# Patient Record
Sex: Female | Born: 1945 | Race: Black or African American | Hispanic: No | Marital: Single | Smoking: Never smoker
Health system: Southern US, Community
[De-identification: ages and names within clinical notes are randomized; demographics above are authoritative.]

## PROBLEM LIST (undated history)

## (undated) DIAGNOSIS — R4020 Unspecified coma: Secondary | ICD-10-CM

## (undated) DIAGNOSIS — L97909 Non-pressure chronic ulcer of unspecified part of unspecified lower leg with unspecified severity: Secondary | ICD-10-CM

## (undated) DIAGNOSIS — I998 Other disorder of circulatory system: Secondary | ICD-10-CM

## (undated) DIAGNOSIS — E1140 Type 2 diabetes mellitus with diabetic neuropathy, unspecified: Secondary | ICD-10-CM

## (undated) DIAGNOSIS — Z992 Dependence on renal dialysis: Secondary | ICD-10-CM

## (undated) DIAGNOSIS — Z978 Presence of other specified devices: Secondary | ICD-10-CM

## (undated) DIAGNOSIS — L089 Local infection of the skin and subcutaneous tissue, unspecified: Secondary | ICD-10-CM

## (undated) DIAGNOSIS — Z960 Presence of urogenital implants: Secondary | ICD-10-CM

## (undated) DIAGNOSIS — E785 Hyperlipidemia, unspecified: Secondary | ICD-10-CM

## (undated) DIAGNOSIS — E1165 Type 2 diabetes mellitus with hyperglycemia: Secondary | ICD-10-CM

## (undated) DIAGNOSIS — I2510 Atherosclerotic heart disease of native coronary artery without angina pectoris: Secondary | ICD-10-CM

## (undated) DIAGNOSIS — E875 Hyperkalemia: Secondary | ICD-10-CM

## (undated) DIAGNOSIS — N390 Urinary tract infection, site not specified: Secondary | ICD-10-CM

## (undated) DIAGNOSIS — Z8673 Personal history of transient ischemic attack (TIA), and cerebral infarction without residual deficits: Secondary | ICD-10-CM

## (undated) DIAGNOSIS — D638 Anemia in other chronic diseases classified elsewhere: Secondary | ICD-10-CM

## (undated) DIAGNOSIS — M1711 Unilateral primary osteoarthritis, right knee: Secondary | ICD-10-CM

## (undated) DIAGNOSIS — D649 Anemia, unspecified: Secondary | ICD-10-CM

## (undated) DIAGNOSIS — R339 Retention of urine, unspecified: Secondary | ICD-10-CM

## (undated) DIAGNOSIS — Z794 Long term (current) use of insulin: Secondary | ICD-10-CM

## (undated) DIAGNOSIS — I70229 Atherosclerosis of native arteries of extremities with rest pain, unspecified extremity: Secondary | ICD-10-CM

## (undated) DIAGNOSIS — K920 Hematemesis: Secondary | ICD-10-CM

## (undated) DIAGNOSIS — N186 End stage renal disease: Secondary | ICD-10-CM

## (undated) DIAGNOSIS — I739 Peripheral vascular disease, unspecified: Secondary | ICD-10-CM

## (undated) DIAGNOSIS — IMO0001 Reserved for inherently not codable concepts without codable children: Secondary | ICD-10-CM

## (undated) DIAGNOSIS — M109 Gout, unspecified: Secondary | ICD-10-CM

## (undated) HISTORY — DX: Personal history of transient ischemic attack (TIA), and cerebral infarction without residual deficits: Z86.73

## (undated) HISTORY — DX: Long term (current) use of insulin: Z79.4

## (undated) HISTORY — PX: NM MYOCAR PERF WALL MOTION: HXRAD629

## (undated) HISTORY — DX: Type 2 diabetes mellitus with hyperglycemia: E11.65

## (undated) HISTORY — DX: Anemia in other chronic diseases classified elsewhere: D63.8

## (undated) HISTORY — DX: Unilateral primary osteoarthritis, right knee: M17.11

## (undated) HISTORY — DX: Reserved for inherently not codable concepts without codable children: IMO0001

## (undated) HISTORY — DX: Type 2 diabetes mellitus with diabetic neuropathy, unspecified: E11.40

## (undated) HISTORY — DX: Atherosclerotic heart disease of native coronary artery without angina pectoris: I25.10

## (undated) HISTORY — DX: Non-pressure chronic ulcer of unspecified part of unspecified lower leg with unspecified severity: I73.9

## (undated) HISTORY — DX: Peripheral vascular disease, unspecified: L97.909

## (undated) HISTORY — DX: Other disorder of circulatory system: I99.8

## (undated) HISTORY — PX: BELOW KNEE LEG AMPUTATION: SUR23

## (undated) HISTORY — DX: Atherosclerosis of native arteries of extremities with rest pain, unspecified extremity: I70.229

## (undated) HISTORY — DX: Hyperlipidemia, unspecified: E78.5

---

## 2010-07-23 ENCOUNTER — Ambulatory Visit (HOSPITAL_COMMUNITY)
Admission: RE | Admit: 2010-07-23 | Discharge: 2010-07-23 | Disposition: A | Discharge status: Home / Self Care | Payer: Medicare Other | Source: Ambulatory Visit | Attending: Specialist | Admitting: Specialist

## 2010-07-23 ENCOUNTER — Other Ambulatory Visit: Payer: Self-pay

## 2010-07-23 DIAGNOSIS — W19XXXA Unspecified fall, initial encounter: Secondary | ICD-10-CM

## 2010-07-23 DIAGNOSIS — M25569 Pain in unspecified knee: Secondary | ICD-10-CM | POA: Insufficient documentation

## 2012-01-04 ENCOUNTER — Emergency Department (HOSPITAL_COMMUNITY): Payer: Medicare Other

## 2012-01-04 ENCOUNTER — Emergency Department (HOSPITAL_COMMUNITY)
Admission: EM | Admit: 2012-01-04 | Discharge: 2012-01-05 | Disposition: A | Discharge status: Home / Self Care | Payer: Medicare Other | Attending: Emergency Medicine | Admitting: Emergency Medicine

## 2012-01-04 ENCOUNTER — Encounter (HOSPITAL_COMMUNITY): Payer: Self-pay | Admitting: *Deleted

## 2012-01-04 ENCOUNTER — Encounter (HOSPITAL_COMMUNITY): Payer: Self-pay

## 2012-01-04 ENCOUNTER — Emergency Department (INDEPENDENT_AMBULATORY_CARE_PROVIDER_SITE_OTHER)
Admission: EM | Admit: 2012-01-04 | Discharge: 2012-01-04 | Disposition: A | Discharge status: Nursing Home (Includes ICF) | Payer: Medicare Other | Source: Home / Self Care | Attending: Emergency Medicine | Admitting: Emergency Medicine

## 2012-01-04 DIAGNOSIS — R600 Localized edema: Secondary | ICD-10-CM

## 2012-01-04 DIAGNOSIS — R609 Edema, unspecified: Secondary | ICD-10-CM

## 2012-01-04 DIAGNOSIS — E119 Type 2 diabetes mellitus without complications: Secondary | ICD-10-CM | POA: Insufficient documentation

## 2012-01-04 DIAGNOSIS — E785 Hyperlipidemia, unspecified: Secondary | ICD-10-CM | POA: Insufficient documentation

## 2012-01-04 DIAGNOSIS — R109 Unspecified abdominal pain: Secondary | ICD-10-CM | POA: Insufficient documentation

## 2012-01-04 DIAGNOSIS — N19 Unspecified kidney failure: Secondary | ICD-10-CM

## 2012-01-04 DIAGNOSIS — R339 Retention of urine, unspecified: Secondary | ICD-10-CM | POA: Insufficient documentation

## 2012-01-04 DIAGNOSIS — M7989 Other specified soft tissue disorders: Secondary | ICD-10-CM | POA: Insufficient documentation

## 2012-01-04 LAB — COMPREHENSIVE METABOLIC PANEL
AST: 33 U/L (ref 0–37)
Albumin: 2.8 g/dL — ABNORMAL LOW (ref 3.5–5.2)
Alkaline Phosphatase: 78 U/L (ref 39–117)
BUN: 50 mg/dL — ABNORMAL HIGH (ref 6–23)
Potassium: 4.5 mEq/L (ref 3.5–5.1)
Sodium: 141 mEq/L (ref 135–145)
Total Protein: 7 g/dL (ref 6.0–8.3)

## 2012-01-04 LAB — URINALYSIS, ROUTINE W REFLEX MICROSCOPIC
Bilirubin Urine: NEGATIVE
Glucose, UA: 250 mg/dL — AB
Leukocytes, UA: NEGATIVE
Nitrite: NEGATIVE
Specific Gravity, Urine: 1.014 (ref 1.005–1.030)
pH: 5.5 (ref 5.0–8.0)

## 2012-01-04 LAB — CBC WITH DIFFERENTIAL/PLATELET
Basophils Absolute: 0 10*3/uL (ref 0.0–0.1)
Basophils Relative: 0 % (ref 0–1)
Eosinophils Absolute: 0.1 10*3/uL (ref 0.0–0.7)
MCH: 27 pg (ref 26.0–34.0)
MCHC: 32.2 g/dL (ref 30.0–36.0)
Neutro Abs: 7.4 10*3/uL (ref 1.7–7.7)
Neutrophils Relative %: 77 % (ref 43–77)
Platelets: 259 10*3/uL (ref 150–400)
RDW: 13.5 % (ref 11.5–15.5)

## 2012-01-04 LAB — POCT I-STAT, CHEM 8
Creatinine, Ser: 2.2 mg/dL — ABNORMAL HIGH (ref 0.50–1.10)
Glucose, Bld: 98 mg/dL (ref 70–99)
HCT: 33 % — ABNORMAL LOW (ref 36.0–46.0)
Hemoglobin: 11.2 g/dL — ABNORMAL LOW (ref 12.0–15.0)
TCO2: 26 mmol/L (ref 0–100)

## 2012-01-04 LAB — URINE MICROSCOPIC-ADD ON

## 2012-01-04 LAB — PRO B NATRIURETIC PEPTIDE: Pro B Natriuretic peptide (BNP): 599.7 pg/mL — ABNORMAL HIGH (ref 0–125)

## 2012-01-04 NOTE — ED Notes (Signed)
Pt is here with LE swelling from groin down to feet, decreased urination, and elevated bun creatinine.  Sent here to consider intra-abdominal obstruction from [facility].  Pt is alert.  Denies sob

## 2012-01-04 NOTE — Discharge Instructions (Signed)
Acute Urinary Retention, Female  You have been seen by a caregiver today because of your inability to urinate (pass your water). This is an uncommon problem in females.  CAUSES   It can be caused by:   Infection.   A side effect of a medication.   A problem in a nearby organ that presses or squeezes on the bladder or the urethra (the tube that drains the bladder).   Psychological problems.  TREATMENT   Treatment may involve a one-time placement of a tube in the bladder to empty it. It may be a problem that does not recur for years. You and your caregiver can decide how to handle this problem in follow-up. You may need to be referred to a specialist for additional examination and tests.   HOME CARE INSTRUCTIONS   If you are to leave the foley catheter (a long, narrow, hollow tube) in and go home with a drainage system, you will need to discuss the best course of action with your caregiver. While the catheter is in, maintain a good intake of fluids. Keep the drainage bag emptied and lower than your catheter. This is so contaminated (infected) urine will not flow back into your bladder. This could lead to a urinary tract infection.  Only take over-the-counter or prescription medicines for pain, discomfort, or fever as directed by your caregiver.   SEEK IMMEDIATE MEDICAL CARE IF:   You develop chills, fever, or show signs of generalized illness that occurs prior to seeing your caregiver.  Document Released: 02/06/2006 Document Revised: 05/02/2011 Document Reviewed: 01/09/2009  Exit[employer] Patient Information 2013 [employer].

## 2012-01-04 NOTE — ED Notes (Signed)
Pt unable to provide urine specimen; cancelled per MD; pt being transported to ED for further evaluation

## 2012-01-04 NOTE — ED Provider Notes (Addendum)
History     CSN: [id number]  Arrival date & time 01/04/12  1444   First MD Initiated Contact with Patient 01/04/12 1545      Chief Complaint  Patient presents with  . Leg Swelling    (Consider location/radiation/quality/duration/timing/severity/associated sxs/prior treatment) HPI Comments: Patient presents urgent care complaining that both of her lower extremities have become swollen since Sunday it has gotten progressively worse. She does admit that she has seen her doctor within the last 2 weeks and she had a right lower extremity infection that she is taking antibiotics for it currently. Says his mildly better less swollen and less red but still tender at touch. She doesn't describes that her doctor recently in the month of October start her on insulin as her diabetes was out of control. She described that she had phone communication with her doctor's office she explained to them that she was having some difficulty urinating they advised her that she might have a urinary tract infection. Patient denies any pain or burning with urination and describes that she has not been able to urinate since last night. She is having some discomfort in her lower abdomen. Patient also describes that since Sunday she is spinning wiping after going to the bathroom she has been noticing blood in the toilet paper. Not on her stools.  The history is provided by the patient.    Past Medical History  Diagnosis Date  . Diabetes mellitus without complication   . Hyperlipidemia     History reviewed. No pertinent past surgical history.  No family history on file.  History  Substance Use Topics  . Smoking status: Never Smoker   . Smokeless tobacco: Not on file  . Alcohol Use: Yes    OB History    Grav Para Term Preterm Abortions TAB SAB Ect Mult Living                  Review of Systems  Constitutional: Positive for activity change and appetite change. Negative for fever and fatigue.    Respiratory: Negative for cough and shortness of breath.   Cardiovascular: Negative for chest pain and leg swelling.  Gastrointestinal: Positive for abdominal pain and anal bleeding. Negative for nausea and diarrhea.  Genitourinary: Positive for difficulty urinating. Negative for dysuria, frequency and flank pain.  Skin: Negative for rash.  Neurological: Negative for dizziness.    Allergies  Review of patient's allergies indicates no known allergies.  Home Medications   Current Outpatient Rx  Name  Route  Sig  Dispense  Refill  . CHOLINE FENOFIBRATE 135 MG PO CPDR   Oral   Take 135 mg by mouth daily.         . INSULIN ASPART 100 UNIT/ML [place] SOLN   Subcutaneous   Inject 20 Units into the skin daily.         . INSULIN GLARGINE 100 UNIT/ML [place] SOLN   Subcutaneous   Inject 20 Units into the skin daily.         Marland Kitchen PRAVASTATIN SODIUM 20 MG PO TABS   Oral   Take 20 mg by mouth daily.           BP 172/89  Pulse 105  Temp 98.1 F (36.7 C) (Oral)  Resp 16  SpO2 99%  Physical Exam  Constitutional: She appears well-developed and well-nourished.  Abdominal: Soft. She exhibits mass. There is no hepatosplenomegaly. There is tenderness in the suprapubic area. There is no rigidity, no rebound, no guarding,  no CVA tenderness, no tenderness at McBurney's point and negative Murphy's sign.    Genitourinary:     Musculoskeletal: She exhibits tenderness.       Legs: Skin: Rash noted.    ED Course  Procedures (including critical care time)  Labs Reviewed  POCT I-STAT, CHEM 8 - Abnormal; Notable for the following:    BUN 52 (*)     Creatinine, Ser 2.20 (*)     Hemoglobin 11.2 (*)     HCT 33.0 (*)     All other components within normal limits   No results found.       MDM  Problem #1 urinary retention with a vesicular below. Do not find any explanation as in pharmacologically induced retention has a palpable physical globe and is unable to provide a urine sample.  Coexistent with this is the sudden onset of lowerBL extremity edema which suggest the possibility of a underlying reasons for this obstructive urinary pattern. Of note patient is mildly tender in her lower abdomen and although a physical globe is most likely (a mass in her lower abdomen cannot be excluded at this point). Problem #2 rectal bleeding source appears to be external hemorrhoids. I think patient will probably need further workup in the emergency department, perhaps will need also a in and out cath and subsequently monitor her urinary output.  66 year old female presents urgent care with urinary retention and newly bilateral lower extremity edema. Patient also with acute renal failure. Patient will be transferred to the emergency department for further evaluation, to be consider for a intra-abdominal obstructive secondary reason for her retention. Patient is hemodynamically stable and afebrile. Has been taking antibiotics by her PCP for a right lower extremity localize infection.       Jimmie Molly, MD 01/04/12 1626  Jimmie Molly, MD 01/04/12 1636  Jimmie Molly, MD 01/04/12 [phone]

## 2012-01-04 NOTE — ED Notes (Signed)
Pt up to use restroom.

## 2012-01-04 NOTE — ED Notes (Signed)
Pt able to void, in and out cath discontinued.

## 2012-01-04 NOTE — ED Notes (Signed)
Patient transported to Ultrasound 

## 2012-01-04 NOTE — ED Provider Notes (Addendum)
History     CSN: [id number]  Arrival date & time 01/04/12  1648   First MD Initiated Contact with Patient 01/04/12 1955      Chief Complaint  Patient presents with  . ucc tx, Leg swelling, urinary retention     (Consider location/radiation/quality/duration/timing/severity/associated sxs/prior treatment) HPIBetty [name] is a 66 y.o. female with a history of diabetes and hyperlipidemia who presents from urgent care with a history of lower extremity swelling, urinary retention. Patient's symptoms started on Sunday when she had consistent diarrhea, she had innumerable episodes of diarrhea, she previously been on Keflex for a right lower extremity wound and she still has one day left of the antibiotic, she's taken it today it was a 14 day course. She says her diarrhea was green and foul-smelling. Her diarrhea is resolved, she's having green stools but they are normal in consistency and frequency. She's complaining about some sharp pain on her left flank which comes and goes, she has not completely emptied her bladder since Monday, she's been having a little bit of output urinary-wise since then.  Denies any new medicine aside insulin. Her lower extremity swelling is also been worsening since Sunday using, she is typically had lower extremity edema but just in her ankles and feet this resolves overnight and gets worse towards the end of the day. Today her swelling goes up to her hips and is made her knees stiff because of the tissue swelling.  Past Medical History  Diagnosis Date  . Diabetes mellitus without complication   . Hyperlipidemia     No past surgical history on file.  No family history on file.  History  Substance Use Topics  . Smoking status: Never Smoker   . Smokeless tobacco: Not on file  . Alcohol Use: Yes    OB History    Grav Para Term Preterm Abortions TAB SAB Ect Mult Living                  Review of Systems At least 10pt or greater review of systems  completed and are negative except where specified in the HPI.  Allergies  Review of patient's allergies indicates no known allergies.  Home Medications   Current Outpatient Rx  Name  Route  Sig  Dispense  Refill  . CHOLINE FENOFIBRATE 135 MG PO CPDR   Oral   Take 135 mg by mouth daily.         . INSULIN ASPART 100 UNIT/ML [place] SOLN   Subcutaneous   Inject 20 Units into the skin daily.         . INSULIN GLARGINE 100 UNIT/ML [place] SOLN   Subcutaneous   Inject 20 Units into the skin daily.         Marland Kitchen PRAVASTATIN SODIUM 20 MG PO TABS   Oral   Take 20 mg by mouth daily.           BP 168/96  Pulse 109  Temp 98.2 F (36.8 C) (Oral)  Resp 18  SpO2 99%  Physical Exam  Nursing notes reviewed.  Electronic medical record reviewed. VITAL SIGNS:   Filed Vitals:   01/04/12 1712  BP: 168/96  Pulse: 109  Temp: 98.2 F (36.8 C)  TempSrc: Oral  Resp: 18  SpO2: 99%   CONSTITUTIONAL: Awake, oriented, appears non-toxic HENT: Atraumatic, normocephalic, oral mucosa pink and moist, airway patent. Nares patent without drainage. External ears normal. EYES: Conjunctiva clear, EOMI, PERRLA NECK: Trachea midline, non-tender, supple CARDIOVASCULAR: Normal heart rate,  Normal rhythm, No murmurs, rubs, gallops PULMONARY/CHEST: Clear to auscultation, no rhonchi, wheezes, or rales. Symmetrical breath sounds. Non-tender. ABDOMINAL: Non-distended, soft, non-tender - no rebound or guarding. Patient's bladder is palpable up to her umbilicus. BS normal. NEUROLOGIC: Non-focal, moving all four extremities, no gross sensory or motor deficits. EXTREMITIES: No clubbing, cyanosis. 2+ lower extremity edema, this does extend to the proximal lower extremities but is only 1+ at the knees then trace at the hips. SKIN: Warm, Dry, No erythema, No rash  ED Course  [place] bedside Performed by: Jones Skene Authorized by: Jones Skene Comments: Abdominal ultrasound performed at bedside shows very enlarged  bladder. Small hypoechoic foci in the kidneys looking like cysts seen on renal ultrasound.   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 infarct old   Labs Reviewed  CBC WITH DIFFERENTIAL - Abnormal; Notable for the following:    RBC 3.52 (*)     Hemoglobin 9.5 (*)     HCT 29.5 (*)     All other components within normal limits  COMPREHENSIVE METABOLIC PANEL - Abnormal; Notable for the following:    BUN 50 (*)     Creatinine, Ser 2.28 (*)     Albumin 2.8 (*)     Total Bilirubin 0.1 (*)     GFR calc non Af Amer 21 (*)     GFR calc Af Amer 24 (*)     All other components within normal limits  PRO B NATRIURETIC PEPTIDE - Abnormal; Notable for the following:    Pro B Natriuretic peptide (BNP) 599.7 (*)     All other components within normal limits   US Abdomen Complete  01/04/2012  *RADIOLOGY REPORT*  Clinical Data:  Evaluate hydronephrosis.  ABDOMINAL ULTRASOUND COMPLETE  Comparison:  None  Findings:  Gallbladder:  The gallbladder appears contracted.  No wall thickening, gallstones or pericholecystic fluid.  Negative sonographic Murphy's sign.  Common Bile Duct:  Within normal limits in caliber.  Liver: Margins of the liver appear slightly nodular.  No focal mass lesion identified.  Within normal limits in parenchymal echogenicity.  IVC:  Appears normal.  Pancreas:  No abnormality identified.  Spleen:  Within normal limits in size and echotexture.  Right kidney:  The right kidney measures 8.6 cm.  There is increased cortical echogenicity.  Multiple anechoic structures within the right kidney likely represents cyst.  Left kidney:  The left kidney measures 8.7 cm.  There are multiple low attenuation structures within the left kidney consistent with cysts.  The cortex appears echogenic.   Abdominal Aorta:  No aneurysm identified.  IMPRESSION:  1.  No hydronephrosis. 2.  Bilateral echogenic kidneys consistent with chronic medical renal disease. 3.  Renal cysts.   Original Report Authenticated By: Signa Kell, M.D.      1. Urinary retention   2. Lower extremity edema       MDM  Elizabeth [name] is a 66 y.o. female presenting with urinary retention and lower extremity edema. Patient has no rales, and mild tachycardia, this could be due to acute urinary retention. Patient also does drink she says 2 beers a day however these are pints I question whether or not it is an underestimation. She has mild left-sided left lower quadrant pain could be related to the urinary retention as well.  She was able to  void but did have a post void is still of 900 cc, was catheterized for the remaining volume. Urinalysis is unremarkable for this long-standing diabetic with some minimal glucose and protein as well. Patient does have a slightly elevated BNP I do not think this represents an acute exacerbation of heart failure, she does not have a history of heart failure currently. Does have some lower extremity edema - again I question whether this is not related to her drinking alcohol and poor nutrition. Patient's albumin is 2.8. She is also mildly anemic, this is been chronic problem. Ultrasound of the abdomen  Shows no hydronephrosis with bilateral echogenic kidneys consistent with chronic medical renal disease with renal cysts. Patient follows up with Dr. Mayford Knife and does have an appointment tomorrow. I do not think she has an emergent need for any further imaging or testing at this time.  Also refer her to urology for possible neurogenic bladder. She has seen PCP tomorrow, do not think any diuretics are indicated at this time either.  I explained the diagnosis and have given explicit precautions to return to the ER including fevers, chills, worsening pain or any other new or worsening symptoms. The  patient understands and accepts the medical plan as it's been dictated and I have answered their questions. Discharge instructions concerning home care and prescriptions have been given.  The patient is STABLE and is discharged to home in good condition.  Jones Skene, MD 01/05/12 0045  Jones Skene, MD 01/05/12 [id number]

## 2012-01-04 NOTE — ED Notes (Signed)
Patient states that both of her legs are swelling and that she has a sore on the left shin that she would like looked at, sx started Sunday morning, she has been elevating them but swelling still increased

## 2012-01-04 NOTE — ED Notes (Addendum)
Bladder scan for residual, >971mL noted. EDP aware.

## 2012-01-04 NOTE — ED Notes (Signed)
Pt reports dark green diarrhea on Sunday.  Pt has pain to rectum.  Pt states some pain to LLQ

## 2012-01-05 NOTE — ED Notes (Signed)
Pt educated on foley catheter care, how empty bag, how to change bag, and how to provide perineal care appropriately. Provided patient with leg bag and straps, and educated on how to use equipment. Pt verbalized understanding and used teach back method to confirm understanding. Pt states will follow up with PCP in the morning and will call urologist to set up appointment in the morning.

## 2012-03-12 ENCOUNTER — Other Ambulatory Visit: Payer: Self-pay | Admitting: Urology

## 2012-03-23 ENCOUNTER — Encounter (HOSPITAL_COMMUNITY): Payer: Self-pay | Admitting: Pharmacy Technician

## 2012-03-28 ENCOUNTER — Encounter (HOSPITAL_COMMUNITY): Payer: Self-pay

## 2012-03-28 ENCOUNTER — Encounter (HOSPITAL_COMMUNITY)
Admission: RE | Admit: 2012-03-28 | Discharge: 2012-03-28 | Disposition: A | Discharge status: Home / Self Care | Payer: Medicare Other | Source: Ambulatory Visit | Attending: Urology | Admitting: Urology

## 2012-03-28 ENCOUNTER — Ambulatory Visit (HOSPITAL_COMMUNITY)
Admission: RE | Admit: 2012-03-28 | Discharge: 2012-03-28 | Disposition: A | Discharge status: Home / Self Care | Payer: Medicare Other | Source: Ambulatory Visit | Attending: Urology | Admitting: Urology

## 2012-03-28 ENCOUNTER — Other Ambulatory Visit: Payer: Self-pay

## 2012-03-28 DIAGNOSIS — I498 Other specified cardiac arrhythmias: Secondary | ICD-10-CM | POA: Insufficient documentation

## 2012-03-28 DIAGNOSIS — Z01812 Encounter for preprocedural laboratory examination: Secondary | ICD-10-CM | POA: Insufficient documentation

## 2012-03-28 DIAGNOSIS — Z01810 Encounter for preprocedural cardiovascular examination: Secondary | ICD-10-CM | POA: Insufficient documentation

## 2012-03-28 DIAGNOSIS — R9431 Abnormal electrocardiogram [ECG] [EKG]: Secondary | ICD-10-CM | POA: Insufficient documentation

## 2012-03-28 DIAGNOSIS — Z01818 Encounter for other preprocedural examination: Secondary | ICD-10-CM | POA: Insufficient documentation

## 2012-03-28 HISTORY — DX: Anemia, unspecified: D64.9

## 2012-03-28 HISTORY — DX: Retention of urine, unspecified: R33.9

## 2012-03-28 LAB — BASIC METABOLIC PANEL
BUN: 62 mg/dL — ABNORMAL HIGH (ref 6–23)
CO2: 23 mEq/L (ref 19–32)
Chloride: 107 mEq/L (ref 96–112)
Creatinine, Ser: 2.24 mg/dL — ABNORMAL HIGH (ref 0.50–1.10)
Potassium: 4.4 mEq/L (ref 3.5–5.1)

## 2012-03-28 LAB — CBC
HCT: 28.7 % — ABNORMAL LOW (ref 36.0–46.0)
Hemoglobin: 9.1 g/dL — ABNORMAL LOW (ref 12.0–15.0)
MCV: 80.2 fL (ref 78.0–100.0)
RBC: 3.58 MIL/uL — ABNORMAL LOW (ref 3.87–5.11)
WBC: 11.1 10*3/uL — ABNORMAL HIGH (ref 4.0–10.5)

## 2012-03-28 LAB — SURGICAL PCR SCREEN: Staphylococcus aureus: NEGATIVE

## 2012-03-28 NOTE — Pre-Procedure Instructions (Signed)
PT HAS A COUGH TODAY AND HEART RATE 128. PREOP  CBC, BMET, EKG AND CXR WERE DONE TODAY AT [facility] AS PER ANESTHESIOLOGIST'S GUIDELINES. HAVE REQUESTED PT'S MOST RECENT NEPHROLOGIST'S OFFICE NOTEFROM  DR. MATTINGLY'S OFFICE AND MOST RECENT EKG AND CXR AND DISCHARGE SUMMARY FROM [facility] NOV 2013.

## 2012-03-28 NOTE — Patient Instructions (Signed)
YOUR SURGERY IS SCHEDULED AT [facility]  ON:  WED  2/12  REPORT TO [place] [facility]ORT STAY [facility] AT:  11:30 AM      PHONE # FOR SHORT STAY IS [phone]  DO NOT EAT  ANYTHING AFTER MIDNIGHT THE NIGHT BEFORE YOUR SURGERY.  NO FOOD, NO CHEWING GUM, NO MINTS, NO CANDIES, NO CHEWING TOBACCO.  YOU MAY HAVE CLEAR LIQUIDS TO DRINK FROM MIDNIGHT THE NIGHT BEFORE YOUR SURGERY UNTIL 7:30 AM DAY OF SURGERY  -- LIKE WATER, SODA.   NOTHING TO DRINK AFTER 7:30 AM DAY OF YOUR SURGERY.  PLEASE TAKE THE FOLLOWING MEDICATIONS THE AM OF YOUR SURGERY WITH A FEW SIPS OF WATER:  NO MEDS TO TAKE AM OF SURGERY.  IF YOU USE INHALERS--USE YOUR INHALERS THE AM OF YOUR SURGERY AND BRING INHALERS TO THE HOSPITAL -TAKE TO SURGERY.    IF YOU ARE DIABETIC:  DO NOT TAKE ANY DIABETIC MEDICATIONS THE AM OF YOUR SURGERY.  IF YOU TAKE INSULIN IN THE EVENINGS--PLEASE ONLY TAKE 1/2 NORMAL EVENING DOSE THE NIGHT BEFORE YOUR SURGERY.  NO INSULIN THE AM OF YOUR SURGERY.  IF YOU HAVE SLEEP APNEA AND USE CPAP OR BIPAP--PLEASE BRING THE MASK AND THE TUBING.  DO NOT BRING YOUR MACHINE.  DO NOT BRING VALUABLES, MONEY, CREDIT CARDS.  DO NOT WEAR JEWELRY, MAKE-UP, NAIL POLISH AND NO METAL PINS OR CLIPS IN YOUR HAIR. CONTACT LENS, DENTURES / PARTIALS, GLASSES SHOULD NOT BE WORN TO SURGERY AND IN MOST CASES-HEARING AIDS WILL NEED TO BE REMOVED.  BRING YOUR GLASSES CASE, ANY EQUIPMENT NEEDED FOR YOUR CONTACT LENS. FOR PATIENTS ADMITTED TO THE HOSPITAL--CHECK OUT TIME THE DAY OF DISCHARGE IS 11:00 AM.  ALL INPATIENT ROOMS ARE PRIVATE - WITH BATHROOM, TELEPHONE, TELEVISION AND WIFI INTERNET.  IF YOU ARE BEING DISCHARGED THE SAME DAY OF YOUR SURGERY--YOU CAN NOT DRIVE YOURSELF HOME--AND SHOULD NOT GO HOME ALONE BY TAXI OR BUS.  NO DRIVING OR OPERATING MACHINERY FOR 24 HOURS FOLLOWING ANESTHESIA / PAIN MEDICATIONS.  PLEASE MAKE ARRANGEMENTS FOR SOMEONE TO BE WITH YOU AT HOME THE FIRST 24 HOURS AFTER SURGERY. RESPONSIBLE DRIVER'S NAME   GRANDAUGHTER JASMYNE MILLER                                               PHONE #  [phone]                              PLEASE READ OVER ANY  FACT SHEETS THAT YOU WERE GIVEN: MRSA INFORMATION, BLOOD TRANSFUSION INFORMATION, INCENTIVE SPIROMETER INFORMATION. FAILURE TO FOLLOW THESE INSTRUCTIONS MAY RESULT IN THE CANCELLATION OF YOUR SURGERY.   PATIENT SIGNATURE_________________________________

## 2012-03-28 NOTE — Pre-Procedure Instructions (Signed)
PT'S EKG AND CXR AND DISCHARGE SUMMARY FROM [facility] FROM ADMISSION 01/18/12 TO 01/26/12 ON THIS CHART. PT'S NEPHROLOGIST OFFICE NOTES AND LABS FROM 02/29/12 DR. MATTINGLY ON PT'S CHART. PT'S PREOP CBC AND BMET AND EKG REPORTS FAXED TO DR. MANNY'S OFFICE FOR REVIEW OF ABNORMALS.

## 2012-03-30 NOTE — Pre-Procedure Instructions (Signed)
RECEIVED FAXED NOTE BACK FROM SELITA AT DR. MANNY'S OFFICE--HE SIGNED THE ABNORMAL LABS, EKG - NO ADDITIONAL ORDERS.

## 2012-04-03 ENCOUNTER — Other Ambulatory Visit (HOSPITAL_COMMUNITY): Payer: Self-pay | Admitting: Internal Medicine

## 2012-04-03 DIAGNOSIS — R0600 Dyspnea, unspecified: Secondary | ICD-10-CM

## 2012-04-03 DIAGNOSIS — R000 Tachycardia, unspecified: Secondary | ICD-10-CM

## 2012-04-03 DIAGNOSIS — R0689 Other abnormalities of breathing: Secondary | ICD-10-CM

## 2012-04-04 ENCOUNTER — Encounter (HOSPITAL_COMMUNITY): Admission: RE | Payer: Self-pay | Source: Ambulatory Visit

## 2012-04-04 ENCOUNTER — Ambulatory Visit (HOSPITAL_COMMUNITY): Admission: RE | Admit: 2012-04-04 | Payer: Medicare Other | Source: Ambulatory Visit | Admitting: Urology

## 2012-04-04 SURGERY — INSERTION, SUPRAPUBIC CATHETER
Anesthesia: General

## 2012-04-08 ENCOUNTER — Encounter (HOSPITAL_COMMUNITY): Payer: Self-pay | Admitting: Emergency Medicine

## 2012-04-08 ENCOUNTER — Emergency Department (HOSPITAL_COMMUNITY)
Admission: EM | Admit: 2012-04-08 | Discharge: 2012-04-08 | Disposition: A | Discharge status: Home / Self Care | Payer: Medicare Other | Attending: Emergency Medicine | Admitting: Emergency Medicine

## 2012-04-08 DIAGNOSIS — Y92009 Unspecified place in unspecified non-institutional (private) residence as the place of occurrence of the external cause: Secondary | ICD-10-CM | POA: Insufficient documentation

## 2012-04-08 DIAGNOSIS — Z8739 Personal history of other diseases of the musculoskeletal system and connective tissue: Secondary | ICD-10-CM | POA: Insufficient documentation

## 2012-04-08 DIAGNOSIS — Z794 Long term (current) use of insulin: Secondary | ICD-10-CM | POA: Insufficient documentation

## 2012-04-08 DIAGNOSIS — E162 Hypoglycemia, unspecified: Secondary | ICD-10-CM

## 2012-04-08 DIAGNOSIS — Y9389 Activity, other specified: Secondary | ICD-10-CM | POA: Insufficient documentation

## 2012-04-08 DIAGNOSIS — E1169 Type 2 diabetes mellitus with other specified complication: Secondary | ICD-10-CM | POA: Insufficient documentation

## 2012-04-08 DIAGNOSIS — Z79899 Other long term (current) drug therapy: Secondary | ICD-10-CM | POA: Insufficient documentation

## 2012-04-08 DIAGNOSIS — Z862 Personal history of diseases of the blood and blood-forming organs and certain disorders involving the immune mechanism: Secondary | ICD-10-CM | POA: Insufficient documentation

## 2012-04-08 DIAGNOSIS — Z8709 Personal history of other diseases of the respiratory system: Secondary | ICD-10-CM | POA: Insufficient documentation

## 2012-04-08 DIAGNOSIS — E785 Hyperlipidemia, unspecified: Secondary | ICD-10-CM | POA: Insufficient documentation

## 2012-04-08 DIAGNOSIS — X31XXXA Exposure to excessive natural cold, initial encounter: Secondary | ICD-10-CM | POA: Insufficient documentation

## 2012-04-08 DIAGNOSIS — T68XXXA Hypothermia, initial encounter: Secondary | ICD-10-CM

## 2012-04-08 DIAGNOSIS — N189 Chronic kidney disease, unspecified: Secondary | ICD-10-CM | POA: Insufficient documentation

## 2012-04-08 DIAGNOSIS — Z87448 Personal history of other diseases of urinary system: Secondary | ICD-10-CM | POA: Insufficient documentation

## 2012-04-08 LAB — GLUCOSE, CAPILLARY
Glucose-Capillary: 64 mg/dL — ABNORMAL LOW (ref 70–99)
Glucose-Capillary: 93 mg/dL (ref 70–99)
Glucose-Capillary: 99 mg/dL (ref 70–99)
Glucose-Capillary: 172 mg/dL — ABNORMAL HIGH (ref 70–99)

## 2012-04-08 LAB — CBC
HCT: 28.5 % — ABNORMAL LOW (ref 36.0–46.0)
Hemoglobin: 9.1 g/dL — ABNORMAL LOW (ref 12.0–15.0)
MCH: 25.1 pg — ABNORMAL LOW (ref 26.0–34.0)
MCHC: 31.9 g/dL (ref 30.0–36.0)
MCV: 78.5 fL (ref 78.0–100.0)
Platelets: 351 10*3/uL (ref 150–400)
RBC: 3.63 MIL/uL — ABNORMAL LOW (ref 3.87–5.11)
RDW: 16.2 % — ABNORMAL HIGH (ref 11.5–15.5)
WBC: 11.5 10*3/uL — ABNORMAL HIGH (ref 4.0–10.5)

## 2012-04-08 LAB — BASIC METABOLIC PANEL
BUN: 44 mg/dL — ABNORMAL HIGH (ref 6–23)
CO2: 26 mEq/L (ref 19–32)
Calcium: 8.6 mg/dL (ref 8.4–10.5)
Chloride: 102 mEq/L (ref 96–112)
Creatinine, Ser: 1.96 mg/dL — ABNORMAL HIGH (ref 0.50–1.10)
GFR calc Af Amer: 29 mL/min — ABNORMAL LOW (ref >90)
GFR calc non Af Amer: 25 mL/min — ABNORMAL LOW (ref >90)
Glucose, Bld: 60 mg/dL — ABNORMAL LOW (ref 70–99)
Potassium: 3.6 mEq/L (ref 3.5–5.1)
Sodium: 139 mEq/L (ref 135–145)

## 2012-04-08 MED ORDER — LOPERAMIDE HCL 2 MG PO CAPS
2.0000 mg | ORAL_CAPSULE | Freq: Once | ORAL | Status: AC
  Administered 2012-04-08: 2 mg via ORAL
  Filled 2012-04-08: qty 1

## 2012-04-08 MED ORDER — DEXTROSE 50 % IV SOLN
1.0000 | Freq: Once | INTRAVENOUS | Status: AC
  Administered 2012-04-08: 50 mL via INTRAVENOUS

## 2012-04-08 MED ORDER — DEXTROSE 50 % IV SOLN
INTRAVENOUS | Status: AC
  Administered 2012-04-08: 50 mL
  Filled 2012-04-08: qty 50

## 2012-04-08 NOTE — ED Notes (Signed)
Was given 1/2 amp d50 for bs 64

## 2012-04-08 NOTE — ED Provider Notes (Signed)
History    67yF with hypoglycemia. Pt's daughter was talking to her on the phone this morning when daughter noted that pt seemed very drowsy/confused. Later pt found on floor of apartment. Blood sugar 34. Given dextrose with much improvement in symptoms. Pt on lantus and SS. Thinks she may be have given herself too much SS insulin after breakfast. Only ate part of bowl of cereal. Currently no complaints. Denies acute pain anywhere. No fever or chills. No urinary complaints. No SOB.  CSN: [id number]  Arrival date & time 04/08/12  1643   First MD Initiated Contact with Patient 04/08/12 1720      No chief complaint on file.   (Consider location/radiation/quality/duration/timing/severity/associated sxs/prior treatment) HPI  Past Medical History  Diagnosis Date  . Hyperlipidemia   . Shortness of breath     WITH EXERTION  . Diabetes mellitus without complication     TAKES INSULIN   . Urinary retention     DX NEUROGENIC BLADDER--HAS INDWELLING FOLEY CATHETER  . Arthritis   . Anemia   . Chronic kidney disease     PT TOLD RECENT TESTING SHOWS KIDNEY DAMAGE--IS SEEING NEPHROLOGIST.  OFFICE NOTES OBTAINED DR. MATTINGLY -"CHRONIC KIDNEY DISEASE 4 MOST LIKELY SECONDARY TO DIABETES"    Past Surgical History  Procedure Laterality Date  . No previous surgery      No family history on file.  History  Substance Use Topics  . Smoking status: Never Smoker   . Smokeless tobacco: Never Used  . Alcohol Use: Yes     Comment: 1 OR 2 BEER A WEEK    OB History   Grav Para Term Preterm Abortions TAB SAB Ect Mult Living                  Review of Systems  All systems reviewed and negative, other than as noted in HPI.   Allergies  Review of patient's allergies indicates no known allergies.  Home Medications   Current Outpatient Rx  Name  Route  Sig  Dispense  Refill  . insulin aspart protamine-insulin aspart (NOVOLOG 70/30) (70-30) 100 UNIT/ML injection   Subcutaneous   Inject  4-20 Units into the skin 3 (three) times daily with meals. 111-150=4units 151-200=8units 200-250=10units 251-300=15units anything over 350 is 20units         . insulin glargine (LANTUS) 100 UNIT/ML injection   Subcutaneous   Inject 10 Units into the skin at bedtime.          . pravastatin (PRAVACHOL) 20 MG tablet   Oral   Take 20 mg by mouth at bedtime.            SpO2 100%  Physical Exam  Nursing note and vitals reviewed. Constitutional: She is oriented to person, place, and time. She appears well-developed and well-nourished. No distress.  HENT:  Head: Normocephalic and atraumatic.  Eyes: Conjunctivae and EOM are normal. Pupils are equal, round, and reactive to light. Right eye exhibits no discharge. Left eye exhibits no discharge.  Neck: Neck supple.  Cardiovascular: Normal rate, regular rhythm and normal heart sounds.  Exam reveals no gallop and no friction rub.   No murmur heard. Pulmonary/Chest: Effort normal and breath sounds normal. No respiratory distress.  Abdominal: Soft. She exhibits no distension. There is no tenderness.  Musculoskeletal: She exhibits no edema and no tenderness.  Neurological: She is alert and oriented to person, place, and time. No cranial nerve deficit. She exhibits normal muscle tone. Coordination normal.  Skin: Skin is  warm and dry. She is not diaphoretic.  Psychiatric: She has a normal mood and affect. Her behavior is normal. Thought content normal.    ED Course  Procedures (including critical care time)  Labs Reviewed  BASIC METABOLIC PANEL - Abnormal; Notable for the following:    Glucose, Bld 60 (*)    BUN 44 (*)    Creatinine, Ser 1.96 (*)    GFR calc non Af Amer 25 (*)    GFR calc Af Amer 29 (*)    All other components within normal limits  CBC - Abnormal; Notable for the following:    WBC 11.5 (*)    RBC 3.63 (*)    Hemoglobin 9.1 (*)    HCT 28.5 (*)    MCH 25.1 (*)    RDW 16.2 (*)    All other components within normal  limits  GLUCOSE, CAPILLARY - Abnormal; Notable for the following:    Glucose-Capillary 64 (*)    All other components within normal limits  GLUCOSE, CAPILLARY - Abnormal; Notable for the following:    Glucose-Capillary 172 (*)    All other components within normal limits  GLUCOSE, CAPILLARY  GLUCOSE, CAPILLARY   No results found.  EKG:  Rhythm: normal sinus Rate:90 Axis: normal  Intervals/Conduction: anterior q waves ST segments: NS ST changes   1. Hypothermia   2. Hypoglycemia       MDM  67yF with hypoglycemia. Likely related to poor PO intake with breakfast. Hypothermia likely 2/2 hypoglycemia. Doubt infectious. Pt observed with stabilization of glucose and now euthermic. I feel safe for DC at this time.  Anemia and renal function consistent with previous labs.         Raeford Razor, MD 04/11/12 [phone]

## 2012-04-08 NOTE — ED Notes (Signed)
Bed:WA22<BR> Expected date:<BR> Expected time:<BR> Means of arrival:<BR> Comments:<BR>

## 2012-04-08 NOTE — ED Notes (Signed)
Pt cbg 99 

## 2012-04-08 NOTE — ED Notes (Addendum)
Pt here via ems for s/p blood sugar of 34 per ems.Pt was talking to her daughter on the phone.daughter stated that her mother  became lethargic daughter stated that she had the apt complex to open her apt door and they found her on the floor at that time blood sugar was 34 she was given 25 Gms of 10% of gulose iv blood sugar after given was 232. Pt is aox4

## 2012-04-09 ENCOUNTER — Other Ambulatory Visit (HOSPITAL_COMMUNITY): Payer: Self-pay | Admitting: *Deleted

## 2012-04-10 ENCOUNTER — Encounter (HOSPITAL_COMMUNITY)
Admission: RE | Admit: 2012-04-10 | Discharge: 2012-04-10 | Disposition: A | Discharge status: Home / Self Care | Payer: Medicare Other | Source: Ambulatory Visit | Attending: Nephrology | Admitting: Nephrology

## 2012-04-10 DIAGNOSIS — N184 Chronic kidney disease, stage 4 (severe): Secondary | ICD-10-CM | POA: Insufficient documentation

## 2012-04-10 DIAGNOSIS — D638 Anemia in other chronic diseases classified elsewhere: Secondary | ICD-10-CM | POA: Insufficient documentation

## 2012-04-10 LAB — POCT HEMOGLOBIN-HEMACUE: Hemoglobin: 9.7 g/dL — ABNORMAL LOW (ref 12.0–15.0)

## 2012-04-10 MED ORDER — EPOETIN ALFA 20000 UNIT/ML IJ SOLN
INTRAMUSCULAR | Status: AC
  Administered 2012-04-10: 20000 [IU] via SUBCUTANEOUS
  Filled 2012-04-10: qty 1

## 2012-04-10 MED ORDER — EPOETIN ALFA 20000 UNIT/ML IJ SOLN
20000.0000 [IU] | INTRAMUSCULAR | Status: DC
  Administered 2012-04-10: 20000 [IU] via SUBCUTANEOUS

## 2012-04-10 MED ORDER — SODIUM CHLORIDE 0.9 % IV SOLN
INTRAVENOUS | Status: DC
  Administered 2012-04-10: 10:00:00 via INTRAVENOUS

## 2012-04-10 MED ORDER — FERUMOXYTOL INJECTION 510 MG/17 ML
1020.0000 mg | Freq: Once | INTRAVENOUS | Status: AC
  Administered 2012-04-10: 1020 mg via INTRAVENOUS
  Filled 2012-04-10: qty 34

## 2012-04-19 ENCOUNTER — Ambulatory Visit (HOSPITAL_COMMUNITY)
Admission: RE | Admit: 2012-04-19 | Discharge: 2012-04-19 | Disposition: A | Discharge status: Home / Self Care | Payer: Medicare Other | Source: Ambulatory Visit | Attending: Internal Medicine | Admitting: Internal Medicine

## 2012-04-19 DIAGNOSIS — R0609 Other forms of dyspnea: Secondary | ICD-10-CM | POA: Insufficient documentation

## 2012-04-19 DIAGNOSIS — E119 Type 2 diabetes mellitus without complications: Secondary | ICD-10-CM | POA: Insufficient documentation

## 2012-04-19 DIAGNOSIS — E785 Hyperlipidemia, unspecified: Secondary | ICD-10-CM | POA: Insufficient documentation

## 2012-04-19 DIAGNOSIS — R532 Functional quadriplegia: Secondary | ICD-10-CM | POA: Insufficient documentation

## 2012-04-19 DIAGNOSIS — R000 Tachycardia, unspecified: Secondary | ICD-10-CM

## 2012-04-19 DIAGNOSIS — R0689 Other abnormalities of breathing: Secondary | ICD-10-CM

## 2012-04-19 DIAGNOSIS — R0989 Other specified symptoms and signs involving the circulatory and respiratory systems: Secondary | ICD-10-CM | POA: Insufficient documentation

## 2012-04-19 DIAGNOSIS — R9439 Abnormal result of other cardiovascular function study: Secondary | ICD-10-CM | POA: Insufficient documentation

## 2012-04-19 DIAGNOSIS — R5381 Other malaise: Secondary | ICD-10-CM | POA: Insufficient documentation

## 2012-04-19 DIAGNOSIS — Z8249 Family history of ischemic heart disease and other diseases of the circulatory system: Secondary | ICD-10-CM | POA: Insufficient documentation

## 2012-04-19 MED ORDER — TECHNETIUM TC 99M SESTAMIBI GENERIC - CARDIOLITE
30.1000 | Freq: Once | INTRAVENOUS | Status: AC | PRN
  Administered 2012-04-19: 30.1 via INTRAVENOUS

## 2012-04-19 MED ORDER — TECHNETIUM TC 99M SESTAMIBI GENERIC - CARDIOLITE
11.0000 | Freq: Once | INTRAVENOUS | Status: AC | PRN
  Administered 2012-04-19: 11 via INTRAVENOUS

## 2012-04-19 MED ORDER — REGADENOSON 0.4 MG/5ML IV SOLN
0.4000 mg | Freq: Once | INTRAVENOUS | Status: AC
  Administered 2012-04-19: 0.4 mg via INTRAVENOUS

## 2012-04-19 MED ORDER — AMINOPHYLLINE 25 MG/ML IV SOLN
75.0000 mg | Freq: Once | INTRAVENOUS | Status: AC
  Administered 2012-04-19: 75 mg via INTRAVENOUS

## 2012-04-19 NOTE — Procedures (Addendum)
[facility] [address] [place] [address] [phone]  Cardiology Nuclear Med Elizabeth [name] is a 67 y.o. female     MRN : [id number]     DOB: 10-20-1945  Procedure Date: 04/19/2012  Nuclear Med Background Indication for Stress Test:  Surgical Clearance and Abnormal EKG History:  NO PREVIOUS CARDIAC HISTORY Cardiac Risk Factors: Family History - CAD, IDDM Type 2 and Lipids  Symptoms:  DOE, Fatigue and Rapid HR   Nuclear Pre-Procedure Caffeine/Decaff Intake:  10:00pm NPO After: 8:00am   IV Site: R Antecubital  IV 0.9% NS with Angio Cath:  22g  Chest Size (in):  N/A IV Started by: Emmit Pomfret, RN  Height: 5\' 4"  (1.626 m)  Cup Size: B  BMI:  Body mass index is 17.16 kg/(m^2). Weight:  100 lb (45.36 kg)   Tech Comments:  N/A    Nuclear Med Study 1 or 2 day study: 1 day  Stress Test Type:  Lexiscan  Order Authorizing Provider:  Iantha Fallen HILTY,MD   Resting Radionuclide: Technetium 45m Sestamibi  Resting Radionuclide Dose: 11.0 mCi   Stress Radionuclide:  Technetium 45m Sestamibi  Stress Radionuclide Dose: 30.1 mCi           Stress Protocol Rest HR: 102 Stress HR:116  Rest BP: 158/95 Stress BP: 138/86  Exercise Time (min): n/a METS: n/a          Dose of Adenosine (mg):  n/a Dose of Lexiscan: 0.4 mg  Dose of Atropine (mg): n/a Dose of Dobutamine: n/a mcg/kg/min (at max HR)  Stress Test Technologist: Ernestene Mention, CCT Nuclear Technologist: Gonzella Lex, CNMT   Rest Procedure:  Myocardial perfusion imaging was performed at rest 45 minutes following the intravenous administration of Technetium 55m Sestamibi. Stress Procedure:  The patient received IV Lexiscan 0.4 mg over 15-seconds.  Technetium 89m Sestamibi injected at 30-seconds.  Due to nausea and fatigue, patient was given IV Aminophylline 75 mg. Symptoms were resolved. There were no significant changes with Lexiscan.  Quantitative spect images were  obtained after a 45 minute delay.  Transient Ischemic Dilatation (Normal <1.22):  1.02 Lung/Heart Ratio (Normal <0.45):  0.34 QGS EDV:  66 ml QGS ESV:  37 ml LV Ejection Fraction: 44%  Signed by Gonzella Lex, CNMT  PHYSICIAN INTERPRETATION:  Rest ECG: NSR with non-specific ST-T wave changes and Poor R wave progression in Precordial leads  Stress ECG: No significant ST segment change suggestive of ischemia.  QPS Raw Data Images:  Mild breast attenuation.  Normal left ventricular size. Stress Images:  There is decreased uptake in the inferior wall.  There is decreased uptake in the lateral wall. There is a small to medium sized, mild intensity, mostly fixed perfusion defect in the mid to apical inferior & inferolateral wall. Rest Images:  There is decreased uptake in the inferior wall.  There is decreased uptake in the lateral wall.  Comparison with the stress images reveals no significant change. Subtraction (SDS):  There is a fixed defect that is most consistent with a previous infarction in conjunction with mild hypokinesis and reduced thickening in this region, however diaphragmatic attenuation is also possible. There is minimal reversibility, suggesting very mild per-infarct ischemia.    Impression Exercise Capacity:  Lexiscan with no exercise. BP Response:  Normal blood pressure response. Clinical Symptoms:  nausea and fatique ECG Impression:  No significant ST segment change with adenosine. Comparison with Prior Nuclear Study: No previous nuclear study performed LV  Wall Motion:  Mildly reduced LV Function - EF ~44%; mild inferolateral hypkinesis.   Overall Impression: Abnormal Myocardial Perfusion Study with evidence of possible small to medium sized, mild intensity perfusion defect in the inferior-inferolateral wall associated with mild hypokinesis.  This would suggest possible prior infarction with very minimal peri-infarct ischemia.    Low risk stress nuclear study.       Marykay Lex, MD  04/19/2012 2:44 PM

## 2012-04-24 ENCOUNTER — Encounter (HOSPITAL_COMMUNITY)
Admission: RE | Admit: 2012-04-24 | Discharge: 2012-04-24 | Disposition: A | Discharge status: Home / Self Care | Payer: Medicare Other | Source: Ambulatory Visit | Attending: Nephrology | Admitting: Nephrology

## 2012-04-24 ENCOUNTER — Encounter (HOSPITAL_COMMUNITY): Payer: Self-pay | Admitting: *Deleted

## 2012-04-24 ENCOUNTER — Inpatient Hospital Stay (HOSPITAL_COMMUNITY)
Admission: EM | Admit: 2012-04-24 | Discharge: 2012-04-26 | DRG: 637 | Disposition: A | Discharge status: Home Health | Payer: Medicare Other | Attending: Internal Medicine | Admitting: Internal Medicine

## 2012-04-24 DIAGNOSIS — E1169 Type 2 diabetes mellitus with other specified complication: Principal | ICD-10-CM | POA: Diagnosis present

## 2012-04-24 DIAGNOSIS — T68XXXD Hypothermia, subsequent encounter: Secondary | ICD-10-CM

## 2012-04-24 DIAGNOSIS — E876 Hypokalemia: Secondary | ICD-10-CM

## 2012-04-24 DIAGNOSIS — N184 Chronic kidney disease, stage 4 (severe): Secondary | ICD-10-CM | POA: Diagnosis present

## 2012-04-24 DIAGNOSIS — E43 Unspecified severe protein-calorie malnutrition: Secondary | ICD-10-CM | POA: Diagnosis present

## 2012-04-24 DIAGNOSIS — N189 Chronic kidney disease, unspecified: Secondary | ICD-10-CM | POA: Diagnosis present

## 2012-04-24 DIAGNOSIS — Z79899 Other long term (current) drug therapy: Secondary | ICD-10-CM

## 2012-04-24 DIAGNOSIS — Z681 Body mass index (BMI) 19 or less, adult: Secondary | ICD-10-CM

## 2012-04-24 DIAGNOSIS — R680 Hypothermia, not associated with low environmental temperature: Secondary | ICD-10-CM | POA: Diagnosis present

## 2012-04-24 DIAGNOSIS — Z794 Long term (current) use of insulin: Secondary | ICD-10-CM

## 2012-04-24 DIAGNOSIS — D638 Anemia in other chronic diseases classified elsewhere: Secondary | ICD-10-CM

## 2012-04-24 DIAGNOSIS — E119 Type 2 diabetes mellitus without complications: Secondary | ICD-10-CM

## 2012-04-24 DIAGNOSIS — T68XXXA Hypothermia, initial encounter: Secondary | ICD-10-CM

## 2012-04-24 DIAGNOSIS — E162 Hypoglycemia, unspecified: Secondary | ICD-10-CM

## 2012-04-24 DIAGNOSIS — E11649 Type 2 diabetes mellitus with hypoglycemia without coma: Secondary | ICD-10-CM | POA: Diagnosis present

## 2012-04-24 DIAGNOSIS — E785 Hyperlipidemia, unspecified: Secondary | ICD-10-CM | POA: Diagnosis present

## 2012-04-24 HISTORY — DX: Anemia in other chronic diseases classified elsewhere: D63.8

## 2012-04-24 LAB — CBC
Hemoglobin: 9.7 g/dL — ABNORMAL LOW (ref 12.0–15.0)
MCHC: 31.3 g/dL (ref 30.0–36.0)
RDW: 18.5 % — ABNORMAL HIGH (ref 11.5–15.5)

## 2012-04-24 LAB — URINALYSIS, ROUTINE W REFLEX MICROSCOPIC
Bilirubin Urine: NEGATIVE
Glucose, UA: 250 mg/dL — AB
Ketones, ur: NEGATIVE mg/dL
pH: 6.5 (ref 5.0–8.0)

## 2012-04-24 LAB — COMPREHENSIVE METABOLIC PANEL
ALT: 32 U/L (ref 0–35)
AST: 31 U/L (ref 0–37)
Albumin: 2.6 g/dL — ABNORMAL LOW (ref 3.5–5.2)
Alkaline Phosphatase: 106 U/L (ref 39–117)
Potassium: 2.8 mEq/L — ABNORMAL LOW (ref 3.5–5.1)
Sodium: 138 mEq/L (ref 135–145)
Total Protein: 6.6 g/dL (ref 6.0–8.3)

## 2012-04-24 LAB — CG4 I-STAT (LACTIC ACID): Lactic Acid, Venous: 0.73 mmol/L (ref 0.5–2.2)

## 2012-04-24 LAB — GLUCOSE, CAPILLARY: Glucose-Capillary: 126 mg/dL — ABNORMAL HIGH (ref 70–99)

## 2012-04-24 LAB — URINE MICROSCOPIC-ADD ON

## 2012-04-24 MED ORDER — SIMVASTATIN 10 MG PO TABS
10.0000 mg | ORAL_TABLET | Freq: Every day | ORAL | Status: DC
  Administered 2012-04-24 – 2012-04-25 (×2): 10 mg via ORAL
  Filled 2012-04-24 (×4): qty 1

## 2012-04-24 MED ORDER — DEXTROSE-NACL 5-0.45 % IV SOLN
INTRAVENOUS | Status: DC
  Administered 2012-04-24: 22:00:00 via INTRAVENOUS

## 2012-04-24 MED ORDER — FUROSEMIDE 40 MG PO TABS
40.0000 mg | ORAL_TABLET | Freq: Every day | ORAL | Status: DC
  Administered 2012-04-24 – 2012-04-26 (×3): 40 mg via ORAL
  Filled 2012-04-24 (×5): qty 1

## 2012-04-24 MED ORDER — EPOETIN ALFA 20000 UNIT/ML IJ SOLN
20000.0000 [IU] | INTRAMUSCULAR | Status: DC
  Administered 2012-04-24: 20000 [IU] via SUBCUTANEOUS

## 2012-04-24 MED ORDER — EPOETIN ALFA 20000 UNIT/ML IJ SOLN
INTRAMUSCULAR | Status: AC
  Filled 2012-04-24: qty 1

## 2012-04-24 MED ORDER — INSULIN ASPART 100 UNIT/ML ~~LOC~~ SOLN
0.0000 [IU] | Freq: Three times a day (TID) | SUBCUTANEOUS | Status: DC
  Administered 2012-04-25 – 2012-04-26 (×4): 3 [IU] via SUBCUTANEOUS

## 2012-04-24 MED ORDER — ONDANSETRON HCL 4 MG PO TABS: 4.0000 mg | ORAL_TABLET | Freq: Four times a day (QID) | ORAL | Status: DC | PRN

## 2012-04-24 MED ORDER — ENOXAPARIN SODIUM 30 MG/0.3ML ~~LOC~~ SOLN
30.0000 mg | SUBCUTANEOUS | Status: DC
  Administered 2012-04-24 – 2012-04-25 (×2): 30 mg via SUBCUTANEOUS
  Filled 2012-04-24 (×4): qty 0.3

## 2012-04-24 MED ORDER — DEXTROSE-NACL 5-0.45 % IV SOLN
INTRAVENOUS | Status: DC
  Administered 2012-04-24: 18:00:00 via INTRAVENOUS

## 2012-04-24 MED ORDER — ONDANSETRON HCL 4 MG/2ML IJ SOLN
4.0000 mg | Freq: Four times a day (QID) | INTRAMUSCULAR | Status: DC | PRN
  Administered 2012-04-25 – 2012-04-26 (×2): 4 mg via INTRAVENOUS
  Filled 2012-04-24 (×2): qty 2

## 2012-04-24 MED ORDER — DEXTROSE 50 % IV SOLN
50.0000 mL | Freq: Once | INTRAVENOUS | Status: AC
  Administered 2012-04-24: 50 mL via INTRAVENOUS

## 2012-04-24 MED ORDER — DEXTROSE 50 % IV SOLN
INTRAVENOUS | Status: AC
  Filled 2012-04-24: qty 50

## 2012-04-24 MED ORDER — POTASSIUM CHLORIDE CRYS ER 20 MEQ PO TBCR
40.0000 meq | EXTENDED_RELEASE_TABLET | ORAL | Status: AC
  Administered 2012-04-24 – 2012-04-25 (×3): 40 meq via ORAL
  Filled 2012-04-24 (×3): qty 2

## 2012-04-24 MED ORDER — POTASSIUM CHLORIDE CRYS ER 20 MEQ PO TBCR
40.0000 meq | EXTENDED_RELEASE_TABLET | Freq: Once | ORAL | Status: AC
  Administered 2012-04-24: 40 meq via ORAL
  Filled 2012-04-24: qty 2

## 2012-04-24 NOTE — ED Notes (Signed)
Pt is eating dinner at this time

## 2012-04-24 NOTE — ED Notes (Signed)
Effie Shy, md made aware of pt temp

## 2012-04-24 NOTE — ED Notes (Signed)
Per EMS, pt at a restaurant-pt was hypoglycemic-24 upon arrival on scene-pt was also unresponsive.  Received 25gm of D10 en route-BG-165.  Pt is A&Ox4, pt denies any complaints at this time.

## 2012-04-24 NOTE — H&P (Signed)
History and Physical  Elizabeth [name] WUX:[id number] DOB: 1945/08/27 DOA: 04/24/2012  Referring physician: ER PCP: [name], MD   Chief Complaint: hypoglycemic  HPI: Patient is a 67 year old female with past medical history most significant for diabetes 2 on insulin, chronic kidney disease stage III, arthritis, hyperlipidemia, anemia who was brought in by EMS after she collapsed in a restaurant. Patient usually inject 6-12 units of insulin NovoLog prior to her meals. She tells me that her CBG was 200 and she injected herself 8 units NovoLog. She was waiting for her meal when she suddenly collapsed. Her CBG was found to be 27 by EMS.  Patient was brought to [facility] ER where she was resuscitated. I was asked to admit the patient for continued hypothermia up to 92 Fahrenheit.  Review of Systems:  15 point review of system is negative otherwise except what is noted above.  Past Medical History  Diagnosis Date  . Hyperlipidemia   . Shortness of breath     WITH EXERTION  . Diabetes mellitus without complication     TAKES INSULIN   . Urinary retention     DX NEUROGENIC BLADDER--HAS INDWELLING FOLEY CATHETER  . Arthritis   . Anemia   . Chronic kidney disease     PT TOLD RECENT TESTING SHOWS KIDNEY DAMAGE--IS SEEING NEPHROLOGIST.  OFFICE NOTES OBTAINED DR. MATTINGLY -"CHRONIC KIDNEY DISEASE 4 MOST LIKELY SECONDARY TO DIABETES"    Past Surgical History  Procedure Laterality Date  . No previous surgery      Social History:  reports that she has never smoked. She has never used smokeless tobacco. She reports that  drinks alcohol. She reports that she does not use illicit drugs.  No Known Allergies  History reviewed. No pertinent family history.   Prior to Admission medications   Medication Sig Start Date End Date Taking? Authorizing Provider  furosemide (LASIX) 40 MG tablet Take 40 mg by mouth daily.   Yes Historical Provider, MD  insulin aspart protamine-insulin aspart  (NOVOLOG 70/30) (70-30) 100 UNIT/ML injection Inject 4-20 Units into the skin 3 (three) times daily with meals. 111-150=4units 151-200=8units 200-250=10units 251-300=15units anything over 350 is 20units   Yes Historical Provider, MD  insulin glargine (LANTUS) 100 UNIT/ML injection Inject 10 Units into the skin at bedtime.    Yes Historical Provider, MD  pravastatin (PRAVACHOL) 20 MG tablet Take 20 mg by mouth at bedtime.    Yes Historical Provider, MD   Physical Exam: Filed Vitals:   04/24/12 1730 04/24/12 1800 04/24/12 1815 04/24/12 1845  BP: 159/77 168/81 162/94 159/80  Pulse: 81 85 89 89  Temp:   92.5 F (33.6 C)   TempSrc:   Rectal   Resp: 19 18 24 14   SpO2:   100%    Physical Exam: General: Vital signs reviewed and noted. Well-developed, well-nourished, in no acute distress; alert, appropriate and cooperative throughout examination.patient is wrapped in the beer hugger  Head: Normocephalic, atraumatic.  Eyes: PERRL, EOMI, No signs of anemia or jaundince.  Nose: Mucous membranes moist, not inflammed, nonerythematous.  Throat: Oropharynx nonerythematous, no exudate appreciated.   Neck: No deformities, masses, or tenderness noted.Supple, No carotid Bruits, no JVD.  Lungs:  Normal respiratory effort. Clear to auscultation BL without crackles or wheezes.  Heart: RRR. S1 and S2 normal without gallop, murmur, or rubs.  Abdomen:  BS normoactive. Soft, Nondistended, non-tender.  No masses or organomegaly.  Extremities: No pretibial edema.  Neurologic: A&O X3, CN II - XII are grossly intact.  Motor strength is 5/5 in the all 4 extremities, Sensations intact to light touch, Cerebellar signs negative.  Skin: No visible rashes, scars.    Wt Readings from Last 3 Encounters:  04/19/12 100 lb (45.36 kg)  03/28/12 100 lb (45.36 kg)  01/04/12 97 lb (43.999 kg)    Labs on Admission:  Basic Metabolic Panel:  Recent Labs Lab 04/24/12 1727  NA 138  K 2.8*  CL 102  CO2 23  GLUCOSE 341*   BUN 42*  CREATININE 2.15*  CALCIUM 8.7    Liver Function Tests:  Recent Labs Lab 04/24/12 1727  AST 31  ALT 32  ALKPHOS 106  BILITOT 0.2*  PROT 6.6  ALBUMIN 2.6*   CBC:  Recent Labs Lab 04/24/12 1001 04/24/12 1727  WBC  --  8.0  HGB 10.7* 9.7*  HCT  --  31.0*  MCV  --  82.2  PLT  --  260    BNP (last 3 results)  Recent Labs  01/04/12 1716  PROBNP 599.7*    CBG:  Recent Labs Lab 04/24/12 1611 04/24/12 1714 04/24/12 1819  GLUCAP 113* 24* 126*     Principal Problem:   Diabetic hypoglycemia Active Problems:   Hypothermia   HLD (hyperlipidemia)   Diabetes type 2, controlled   CKD (chronic kidney disease)   Anemia of chronic disease   Assessment/Plan Patient is a 67 year old female with past medical history most significant for type 2 diabetes on insulin and chronic kidney disease who comes in today after a fall and found to be hypoglycemic. Patient was found to be hypothermic most likely secondary to her hypoglycemic event. -Patient will be admitted to step down bed as she needs to be continued on beer hugger -Will replete hypokalemia -Continue to monitor for any EKG abnormalities -Patient will most likely be discharged in the morning based on her response to beer hugger -Patient will need to be discharged on a lower dose of sliding scale NovoLog due to this hypoglycemic event -I will also check HbA1c  Code Status: Full code Family Communication: None present at bedside Disposition Plan/Anticipated LOS: Guarded at this time  Time spent: 70 minutes  Lars Mage, MD  [facility] Team 8 [facility] If 7PM-7AM, please contact night-coverage at [email or web address], password [facility] 04/24/2012, 8:34 PM

## 2012-04-24 NOTE — ED Notes (Signed)
Pt alert and oriented x4. Respirations even and unlabored, bilateral symmetrical rise and fall of chest. Skin warm and dry. In no acute distress. Denies needs.   

## 2012-04-24 NOTE — ED Notes (Signed)
2 more blankets applied to pt, bear hugger still on pt

## 2012-04-24 NOTE — ED Provider Notes (Signed)
History     CSN: [id number]  Arrival date & time 04/24/12  1558   First MD Initiated Contact with Patient 04/24/12 1710      Chief Complaint  Patient presents with  . Hypoglycemia  . Cold Exposure    (Consider location/radiation/quality/duration/timing/severity/associated sxs/prior treatment) HPI Comments: Elizabeth [name] is a 67 y.o. Female who was at a restaurant, ordering food, when she slumped to the ground. She was treated seen by EMS. They discovered that she was hypoglycemic. They treated her with intravenous glucose, with improvement of her blood sugar to 165. She arrived to the emergency department, alert, and conversant. After arrival in the ED, she began to be somnolent. A CBG was repeated and was again low.   Level V Caveat- altered mental status  The history is provided by a relative.    Past Medical History  Diagnosis Date  . Hyperlipidemia   . Shortness of breath     WITH EXERTION  . Diabetes mellitus without complication     TAKES INSULIN   . Urinary retention     DX NEUROGENIC BLADDER--HAS INDWELLING FOLEY CATHETER  . Arthritis   . Anemia   . Chronic kidney disease     PT TOLD RECENT TESTING SHOWS KIDNEY DAMAGE--IS SEEING NEPHROLOGIST.  OFFICE NOTES OBTAINED DR. MATTINGLY -"CHRONIC KIDNEY DISEASE 4 MOST LIKELY SECONDARY TO DIABETES"    Past Surgical History  Procedure Laterality Date  . No previous surgery      History reviewed. No pertinent family history.  History  Substance Use Topics  . Smoking status: Never Smoker   . Smokeless tobacco: Never Used  . Alcohol Use: Yes     Comment: 1 OR 2 BEER A WEEK    OB History   Grav Para Term Preterm Abortions TAB SAB Ect Mult Living                  Review of Systems  Unable to perform ROS   Allergies  Review of patient's allergies indicates no known allergies.  Home Medications   No current outpatient prescriptions on file.  BP 131/57  Pulse 101  Temp(Src) 98.6 F (37 C) (Oral)  Resp  16  Ht 5' 4.17" (1.63 m)  Wt 100 lb 1.4 oz (45.4 kg)  BMI 17.09 kg/m2  SpO2 99%  Physical Exam  Nursing note and vitals reviewed. Constitutional: She appears well-developed.  Frail, appears older than stated age  HENT:  Head: Normocephalic and atraumatic.  Eyes: Conjunctivae and EOM are normal. Pupils are equal, round, and reactive to light.  Neck: Normal range of motion and phonation normal. Neck supple.  Cardiovascular: Normal rate, regular rhythm and intact distal pulses.   Pulmonary/Chest: Effort normal and breath sounds normal. She exhibits no tenderness.  Abdominal: Soft. She exhibits no distension. There is no tenderness. There is no guarding.  Musculoskeletal: Normal range of motion.  Neurological: She is alert. She has normal strength. No cranial nerve deficit. She exhibits normal muscle tone. Coordination normal.  Skin: Skin is warm and dry.  Psychiatric:  Appears depressed    ED Course  Procedures (including critical care time) Bear hugger, for hypo- thermia Urgent treatment: D50, 1 amp Repeat CBGs stabilized, normal Serial temperature, she remained hypothermic After treatment with D50 in the ED, patient recovered her normal mental status. She is alert and conversant. She remembers taking her insulin this morning, but not eating well at lunchtime Additional ED treatment, oral potassium  CRITICAL CARE Performed by: Flint Melter  Total critical care time: 45 minutes  Critical care time was exclusive of separately billable procedures and treating other patients.  Critical care was necessary to treat or prevent imminent or life-threatening deterioration.  Critical care was time spent personally by me on the following activities: development of treatment plan with patient and/or surrogate as well as nursing, discussions with consultants, evaluation of patient's response to treatment, examination of patient, obtaining history from patient or surrogate, ordering and  performing treatments and interventions, ordering and review of laboratory studies, ordering and review of radiographic studies, pulse oximetry and re-evaluation of patient's condition.  Labs Reviewed  GLUCOSE, CAPILLARY - Abnormal; Notable for the following:    Glucose-Capillary 113 (*)    All other components within normal limits  CBC - Abnormal; Notable for the following:    RBC 3.77 (*)    Hemoglobin 9.7 (*)    HCT 31.0 (*)    MCH 25.7 (*)    RDW 18.5 (*)    All other components within normal limits  COMPREHENSIVE METABOLIC PANEL - Abnormal; Notable for the following:    Potassium 2.8 (*)    Glucose, Bld 341 (*)    BUN 42 (*)    Creatinine, Ser 2.15 (*)    Albumin 2.6 (*)    Total Bilirubin 0.2 (*)    GFR calc non Af Amer 22 (*)    GFR calc Af Amer 26 (*)    All other components within normal limits  URINALYSIS, ROUTINE W REFLEX MICROSCOPIC - Abnormal; Notable for the following:    APPearance CLOUDY (*)    Glucose, UA 250 (*)    Hgb urine dipstick LARGE (*)    Protein, ur 100 (*)    Leukocytes, UA MODERATE (*)    All other components within normal limits  GLUCOSE, CAPILLARY - Abnormal; Notable for the following:    Glucose-Capillary 24 (*)    All other components within normal limits  GLUCOSE, CAPILLARY - Abnormal; Notable for the following:    Glucose-Capillary 126 (*)    All other components within normal limits  URINE MICROSCOPIC-ADD ON - Abnormal; Notable for the following:    Bacteria, UA MANY (*)    All other components within normal limits  GLUCOSE, CAPILLARY - Abnormal; Notable for the following:    Glucose-Capillary 292 (*)    All other components within normal limits  CULTURE, BLOOD (ROUTINE X 2)  CULTURE, BLOOD (ROUTINE X 2)  URINE CULTURE  MRSA PCR SCREENING  BASIC METABOLIC PANEL  CBC  HEMOGLOBIN A1C  CG4 I-STAT (LACTIC ACID)   Nursing notes, applicable records and vitals reviewed.  Radiologic Images/Reports reviewed.    1. Hypoglycemia   2.  Hypokalemia   3. Hypothermia, initial encounter       MDM   Hypoglycemia, with hypothermia. No clear source for infection. No sign of sepsis. No metabolic instability. She'll need to be admitted for observation, treatment of hypokalemia, hypothermia, and adjustment of insulin treatment        Flint Melter, MD 04/24/12 2253

## 2012-04-24 NOTE — ED Notes (Signed)
Report received from [facility] RN,  Pt is from home and she was out to eat today and became unresponsive,  She is hypothermic,  'placed on bear hugger" and hypoglycemic and "given IV medication and oral food tray per Dr Effie Shy.

## 2012-04-24 NOTE — ED Notes (Signed)
Cammie Sickle, pt granddaughter, [phone]

## 2012-04-25 DIAGNOSIS — E119 Type 2 diabetes mellitus without complications: Secondary | ICD-10-CM

## 2012-04-25 DIAGNOSIS — N189 Chronic kidney disease, unspecified: Secondary | ICD-10-CM

## 2012-04-25 DIAGNOSIS — D638 Anemia in other chronic diseases classified elsewhere: Secondary | ICD-10-CM

## 2012-04-25 LAB — GLUCOSE, CAPILLARY
Glucose-Capillary: 197 mg/dL — ABNORMAL HIGH (ref 70–99)
Glucose-Capillary: 215 mg/dL — ABNORMAL HIGH (ref 70–99)

## 2012-04-25 LAB — CBC
HCT: 30.9 % — ABNORMAL LOW (ref 36.0–46.0)
MCHC: 31.7 g/dL (ref 30.0–36.0)
Platelets: 258 10*3/uL (ref 150–400)
RDW: 18.5 % — ABNORMAL HIGH (ref 11.5–15.5)
WBC: 6 10*3/uL (ref 4.0–10.5)

## 2012-04-25 LAB — BASIC METABOLIC PANEL
BUN: 41 mg/dL — ABNORMAL HIGH (ref 6–23)
Chloride: 100 mEq/L (ref 96–112)
Creatinine, Ser: 2.12 mg/dL — ABNORMAL HIGH (ref 0.50–1.10)
GFR calc Af Amer: 26 mL/min — ABNORMAL LOW (ref >90)
GFR calc non Af Amer: 23 mL/min — ABNORMAL LOW (ref >90)

## 2012-04-25 LAB — HEMOGLOBIN A1C
Hgb A1c MFr Bld: 7.4 % — ABNORMAL HIGH (ref <5.7)
Mean Plasma Glucose: 166 mg/dL — ABNORMAL HIGH (ref <117)

## 2012-04-25 MED ORDER — LIVING WELL WITH DIABETES BOOK
Freq: Once | Status: AC
  Administered 2012-04-25: 17:00:00
  Filled 2012-04-25: qty 1

## 2012-04-25 NOTE — Progress Notes (Signed)
CARE MANAGEMENT NOTE 04/25/2012  Patient:  Elizabeth [name],Elizabeth [name]   Account Number:  [id number]  Date Initiated:  04/25/2012  Documentation initiated by:  DAVIS,RHONDA  Subjective/Objective Assessment:   pt with hypothermia and bld glucose down to 24.  Question of adequate supervision at home.     Action/Plan:   lives alone.  APS were contacted by outside point.  And have seen patient.   Anticipated DC Date:  04/28/2012   Anticipated DC Plan:    In-house referral  Clinical Social Worker      DC Planning Services  CM consult      Choice offered to / List presented to:             Status of service:  In process, will continue to follow Medicare Important Message given?   (If response is "NO", the following Medicare IM given date fields will be blank) Date Medicare IM given:   Date Additional Medicare IM given:    Discharge Disposition:    Per UR Regulation:  Reviewed for med. necessity/level of care/duration of stay  If discussed at Long Length of Stay Meetings, dates discussed:    Comments:  [id number]/Rhonda Earlene Plater, RN, BSN, CCM:  CHART REVIEWED AND UPDATED.  Next chart review due on [id number]. NO DISCHARGE NEEDS PRESENT AT THIS TIME. CASE MANAGEMENT [phone]

## 2012-04-25 NOTE — Progress Notes (Addendum)
Inpatient Diabetes Program Recommendations  AACE/ADA: New Consensus Statement on Inpatient Glycemic Control (2013)  Target Ranges:  Prepandial:   less than 140 mg/dL      Peak postprandial:   less than 180 mg/dL (1-2 hours)      Critically ill patients:  140 - 180 mg/dL   Reason for Visit: Hypoglycemia as reason for admission. Pt on a "sliding scale" insulin regimen at home which is to correct blood sugar as well as cover her carbohydrate intake tid. Pt states she took 8 units for a glucose of 247 prior to the hypoglycemic event (although her scale directed to give 10 units)  The patient was not planning to eat at this time and said that she had eaten a "little bit" prior to checking her glucose and decided to give 8 units rather than 10 units.  This dose is based on the "SSI" which the patient has used at home.  The patient then experienced hypoglycemia in the 20's.  The traditional "sliding scale" which covers glucose and po intake is convenient but not very efficient when the patient does not eat consistently the same foods at the same times.  I would therefore suggest that patient have a correction scale and a meal coverage/carbohydrate (po) intake scale (as we have here with our glycemic control order set). Based on her weight, her sensitivity (correction) factor is approximately 1 unit for 50 mg/dL over her target.  Could use the sensitive scale on the hospital glycemic control order set (starts with 1 unit at 120 mg/dL, as is ordered now.  However with her kidney status, would recommend a correction scale that begins at 150 mg/dL i.e.  161-096, give 1 unit, 201-250, give 2 units 251-300, give 4 units (higher glucose may meet higher insulin resistance), and greater than 300, give 6 units. This scale she could use tid whether she eats or not. Then her meal coverage/ po (carb) intake would be covered with Novolog based on her servings of carb intake, which based on her weight is approx 1 unit for  every 1 serving (15 grams) carbohydrate.  She has an understanding of carbohydrates and this can be reviewed while here through both the RD and pt ed videos.  Will order an RD consult as well. Would advise that patient take her meal coverage after she has eaten and feels comfortable that she will not regurgitate. Have talked to patient about this idea, and she is definitely interested in doing this and states that she has asked for such a regimen for quite a while from her primary MD. If she has home health, the RN could assist her further with counting carbs and servings of carbs. Regarding her lantus, would recommend that it be increased to  15 units (possibly 20 units) based on her weight;due to renal complications, might start with 15 units and increase towards 20 units until fasting is at least down to 150 mg/dL. This could be started while here using 3-4 units meal coverage on the glycemic control order set.  While here and at home, please add the HS correction scale per glycemic control order set. Note: Please consider making these adjustments while here as inpatient. Glad to follow and assist as needed. Thank you, Lenor Coffin, RN, CNS, Diabetes Coordinator [phone])

## 2012-04-25 NOTE — Progress Notes (Signed)
Patient a/ox4.  Living with DM booklet given to patient by Diabetes educator.  Patient has watched DM education video #501 before transfer.
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
## 2012-04-26 LAB — GLUCOSE, CAPILLARY
Glucose-Capillary: 290 mg/dL — ABNORMAL HIGH (ref 70–99)
Glucose-Capillary: 204 mg/dL — ABNORMAL HIGH (ref 70–99)

## 2012-04-26 LAB — CBC
HCT: 33.8 % — ABNORMAL LOW (ref 36.0–46.0)
Platelets: 266 10*3/uL (ref 150–400)
RDW: 18.7 % — ABNORMAL HIGH (ref 11.5–15.5)
WBC: 6 10*3/uL (ref 4.0–10.5)

## 2012-04-26 MED ORDER — INSULIN GLARGINE 100 UNIT/ML ~~LOC~~ SOLN: 15.0000 [IU] | Freq: Every day | SUBCUTANEOUS | Status: DC

## 2012-04-26 MED ORDER — INSULIN GLARGINE 100 UNIT/ML ~~LOC~~ SOLN
15.0000 [IU] | Freq: Every day | SUBCUTANEOUS | Status: DC
  Administered 2012-04-26: 15 [IU] via SUBCUTANEOUS

## 2012-04-26 MED ORDER — ONDANSETRON HCL 4 MG PO TABS: 4.0000 mg | ORAL_TABLET | Freq: Four times a day (QID) | ORAL | Status: DC | PRN

## 2012-04-26 MED ORDER — GLUCERNA SHAKE PO LIQD
237.0000 mL | Freq: Two times a day (BID) | ORAL | Status: DC
  Administered 2012-04-26: 237 mL via ORAL
  Filled 2012-04-26: qty 237

## 2012-04-26 MED ORDER — INSULIN ASPART 100 UNIT/ML ~~LOC~~ SOLN: 0.0000 [IU] | Freq: Every day | SUBCUTANEOUS | Status: DC

## 2012-04-26 MED ORDER — INSULIN ASPART 100 UNIT/ML ~~LOC~~ SOLN: SUBCUTANEOUS | Status: DC

## 2012-04-26 MED ORDER — INSULIN ASPART 100 UNIT/ML ~~LOC~~ SOLN
0.0000 [IU] | Freq: Three times a day (TID) | SUBCUTANEOUS | Status: DC
  Administered 2012-04-26: 5 [IU] via SUBCUTANEOUS
  Administered 2012-04-26: 3 [IU] via SUBCUTANEOUS

## 2012-04-26 NOTE — Progress Notes (Signed)
Pt. Was discharged home. Prescriptions and discharge instructions were given to the pt.

## 2012-04-26 NOTE — Evaluation (Signed)
Occupational Therapy Evaluation Patient Details Name: Rhyse Skowron MRN: [id number] DOB: 1945-12-11 Today's Date: 04/26/2012 Time: 4098-1191    OT Assessment / Plan / Recommendation Clinical Impression  Pt presents to OT s/p hypoglycemic event. Pt will benefit from skilled OT to address safety with ADL activity, as well as transition to [facility] to address safety in her home.    OT Assessment  Patient needs continued OT Services    Follow Up Recommendations  Home health OT;Other (comment) (aide, and RN)       Equipment Recommendations  None recommended by OT       Frequency  Min 3X/week    Precautions / Restrictions Precautions Precautions: Fall Restrictions Weight Bearing Restrictions: No       ADL  Grooming: Simulated;Wash/dry hands;Supervision/safety Where Assessed - Grooming: Unsupported standing Upper Body Dressing: Simulated;Set up Where Assessed - Upper Body Dressing: Unsupported sitting Lower Body Dressing: Performed;Set up Where Assessed - Lower Body Dressing: Unsupported sit to stand Toilet Transfer: Performed;Supervision/safety Toilet Transfer Method: Sit to Barista: Comfort height toilet Toileting - Clothing Manipulation and Hygiene: Set up Where Assessed - Toileting Clothing Manipulation and Hygiene: Standing Transfers/Ambulation Related to ADLs: Feel pt will be able to return home with HHOT and aide.   ADL Comments: Pt states she has a good support system at her apartment and her daugther only checks on her via text message 1 time a week,    OT Diagnosis: Generalized weakness  OT Problem List: Decreased strength OT Treatment Interventions: Self-care/ADL training;Patient/family education   OT Goals Acute Rehab OT Goals OT Goal Formulation: With patient ADL Goals Pt Will Perform Grooming: Independently;Standing at sink ADL Goal: Grooming - Progress: Goal set today Pt Will Perform Upper Body Bathing: Independently;Standing at  sink ADL Goal: Upper Body Bathing - Progress: Goal set today Pt Will Perform Lower Body Bathing: Independently;Standing at sink;Sit to stand from chair ADL Goal: Lower Body Bathing - Progress: Goal set today Pt Will Perform Upper Body Dressing: Independently;Sitting, chair ADL Goal: Upper Body Dressing - Progress: Goal set today Pt Will Perform Lower Body Dressing: Independently;Sit to stand from chair ADL Goal: Lower Body Dressing - Progress: Goal set today Pt Will Transfer to Toilet: Independently ADL Goal: Toilet Transfer - Progress: Goal set today Pt Will Perform Tub/Shower Transfer: Tub transfer;Grab bars ADL Goal: Tub/Shower Transfer - Progress: Goal set today  Visit Information  Last OT Received On: 04/26/12    Subjective Data  Subjective: my daugther checks in on me via text   Prior Functioning     Home Living Lives With: Alone Type of Home: Independent living facility Home Access: Elevator Home Layout: One level Bathroom Shower/Tub: Engineer, manufacturing systems: Standard Home Adaptive Equipment: Grab bars in shower Prior Function Level of Independence: Independent Driving: No Communication Communication: No difficulties         Vision/Perception Vision - History Patient Visual Report: No change from baseline   Cognition  Cognition Overall Cognitive Status: Appears within functional limits for tasks assessed/performed Arousal/Alertness: Awake/alert Orientation Level: Appears intact for tasks assessed Behavior During Session: [facility] for tasks performed    Extremity/Trunk Assessment Right Upper Extremity Assessment RUE ROM/Strength/Tone: [facility] for tasks assessed Left Upper Extremity Assessment LUE ROM/Strength/Tone: WFL for tasks assessed     Mobility Bed Mobility Bed Mobility: Supine to Sit Supine to Sit: 5: Supervision;With rails Transfers Transfers: Sit to Stand;Stand to Sit           End of Session OT - End of  Session Activity Tolerance:  Patient tolerated treatment well Patient left: in chair;with call bell/phone within reach  GO     REDDING, Metro Kung 04/26/2012, 11:17 AM

## 2012-04-26 NOTE — Clinical Social Work Note (Addendum)
CSW received phone call from daughter, April Wilson who shared many concerns about Pt living alone. She reports Pt has had increasing difficulty managing her diabetes, meals and activities of daily living. She also reports her mother has been having falls at home that she feels is related to her inability to manage her meals and diabetes. Daughter also shared many concerns about Pt's ability of self care and cleanliness of her apartment. Daughter concerned Pt is having memory impairments as well.   Daughter appears to be acting in her mother's best interest and seems appropriately concerned. Daughter also would like to see if her mother is interested in [facility] while here.   Daughter eager for placement of some kind. CSW exp[lained the differences in ALF and SNF and payor sources. Per daughter, Pt has funds to afford ALF, if SNF is not appropriate.   CSW will give report to 3E CSW, Jacklynn Lewis and she will follow.   Doreen Salvage, LCSW ICU/Stepdown Clinical Social Worker [facility] Cell [phone] Hours 8am-1200pm M-F

## 2012-04-26 NOTE — Progress Notes (Signed)
INITIAL NUTRITION ASSESSMENT  Pt meets criteria for severe MALNUTRITION in the context of chronic illness as evidenced by 20.9% weight loss in the past year per pt report in addition to pt with visible severe muscle wasting and subcutaneous fat loss in upper arms and legs.  DOCUMENTATION CODES Per approved criteria  -Severe malnutrition in the context of chronic illness -Underweight   INTERVENTION: - Glucerna shake BID, provided pt with coupons - Discussed diabetic diet and ways to prevent hypoglycemia. Provided handout of this information. Pt states she has a friend that could take her to [facility] class - consult ordered.  - Will continue to monitor   NUTRITION DIAGNOSIS: Unintended weight loss related to poor appetite as evidenced by pt statement.   Goal: Pt to consume >90% of meals/supplements  Monitor:  Weights, labs, intake, CBGs  Reason for Assessment: Consult  67 y.o. female  Admitting Dx: Diabetic hypoglycemia  ASSESSMENT: Pt reports since admission she had 2 episodes of vomiting today which started when she came to the hospital. Pt reports eating 3 small meals/day at home. Pt reports 23 pound unintended weight loss in the past year with decreased muscle strength and weakness. Pt reports she had low blood sugar because she got  confused with Novolog with being long acting and Lantus being short acting. Performed nutrition focused physical exam.  Nutrition Focused Physical Exam:  Subcutaneous Fat:  Orbital Region: mild/moderate wasting Upper Arm Region: severe wasting Thoracic and Lumbar Region: N/A  Muscle:  Temple Region: mild/mdoerate wasting Clavicle Bone Region: mild/moderate wasting Clavicle and Acromion Bone Region: mild/moderate wasting Scapular Bone Region: N/A Dorsal Hand: severe wasting Patellar Region: severe wasting Anterior Thigh Region: severe wasting Posterior Calf Region: severe wasting  Edema: Non-pitting RLE  and LLE edema   Lab Results  Component Value Date   HGBA1C 7.4* 04/24/2012    Height: Ht Readings from Last 1 Encounters:  04/24/12 5' 4.17" (1.63 m)    Weight: Wt Readings from Last 1 Encounters:  04/24/12 100 lb 1.4 oz (45.4 kg)    Ideal Body Weight: 120 lb  % Ideal Body Weight: 83  Wt Readings from Last 10 Encounters:  04/24/12 100 lb 1.4 oz (45.4 kg)  04/19/12 100 lb (45.36 kg)  03/28/12 100 lb (45.36 kg)  01/04/12 97 lb (43.999 kg)    Usual Body Weight: 110 lb last year  % Usual Body Weight: 91  BMI:  Body mass index is 17.09 kg/(m^2).  Estimated Nutritional Needs: Kcal: 1350-1600 Protein: 55-65g Fluid: 1.3-1.6L/day  Skin: Non-pitting RLE and LLE edema  Diet Order: Carb Control  EDUCATION NEEDS: -Education needs addressed - discussed healthy eating with diabetic diet. Provided handouts of this information. Teach back method used.    Intake/Output Summary (Last 24 hours) at 04/26/12 1208 Last data filed at 04/26/12 0500  Gross per 24 hour  Intake      0 ml  Output    590 ml  Net   -590 ml    Last BM: PTA  Labs:   Recent Labs Lab 04/24/12 1727 04/25/12 0330  NA 138 134*  K 2.8* 4.5  CL 102 100  CO2 23 24  BUN 42* 41*  CREATININE 2.15* 2.12*  CALCIUM 8.7 8.4  GLUCOSE 341* 245*    CBG (last 3)   Recent Labs  04/25/12 2202 04/26/12 0743 04/26/12 1151  GLUCAP 175* 219* 290*    Scheduled Meds: . enoxaparin (LOVENOX) injection  30 mg Subcutaneous Q24H  .  furosemide  40 mg Oral Daily  . insulin aspart  0-5 Units Subcutaneous QHS  . insulin aspart  0-9 Units Subcutaneous TID WC  . insulin glargine  15 Units Subcutaneous Daily  . simvastatin  10 mg Oral q1800    Continuous Infusions:   Past Medical History  Diagnosis Date  . Hyperlipidemia   . Shortness of breath     WITH EXERTION  . Diabetes mellitus without complication     TAKES INSULIN   . Urinary retention     DX NEUROGENIC BLADDER--HAS INDWELLING FOLEY CATHETER  .  Arthritis   . Anemia   . Chronic kidney disease     PT TOLD RECENT TESTING SHOWS KIDNEY DAMAGE--IS SEEING NEPHROLOGIST.  OFFICE NOTES OBTAINED DR. MATTINGLY -"CHRONIC KIDNEY DISEASE 4 MOST LIKELY SECONDARY TO DIABETES"    Past Surgical History  Procedure Laterality Date  . No previous surgery       Levon Hedger MS, RD, LDN [phone] Pager [phone] After Hours Pager

## 2012-04-26 NOTE — Care Management (Signed)
CM spoke with patient concerning discharge planning. CM spoke with pt's daughter with her permission. Pt's daughter expressed concerns of pt's self care. Daughter explained pt inappropriate for Skilled Nursing Facility based on no skilled nursing needs. PT eval suggest HH/OT. Pt request RN/HHA. MD orders entered which include Sw.. Per pt choice [facility] to provide services upon discharge. [facility] rep Norberta Keens made aware of referral. Pt provided with private duty care list for assistance with extended services. Pt's [place] [employer] which does not provide outpt community management in this area. [facility] unable to assist pt. No other needs stated. CSW following for further home concerns.  Roxy Manns Davis,RN,BSN [phone]

## 2012-04-26 NOTE — Evaluation (Signed)
Physical Therapy Evaluation Patient Details Name: Elizabeth [name] MRN: [id number] DOB: [birth year] Today's Date: 04/26/2012 Time: 4098-1191 PT Time Calculation (min): 18 min  PT Assessment / Plan / Recommendation Clinical Impression  Pt. is 67 y/o female with hypoglycemic episodes. Pt. lives alone in independent apt. Pt. was Independent PTA. Pt. will benefit from PT while in acute care. Recommend HHPT at DC and to consider ALF at DC.     PT Assessment  Patient needs continued PT services    Follow Up Recommendations  Home health PT    Does the patient have the potential to tolerate intense rehabilitation      Barriers to Discharge        Equipment Recommendations  None recommended by PT    Recommendations for Other Services     Frequency Min 3X/week    Precautions / Restrictions Precautions Precautions: Fall   Pertinent Vitals/Pain       Mobility  Bed Mobility Bed Mobility: Supine to Sit Supine to Sit: 5: Supervision;With rails Transfers Transfers: Sit to Stand;Stand to Sit Sit to Stand: 5: Supervision Stand to Sit: 5: Supervision Ambulation/Gait Ambulation/Gait Assistance: 5: Supervision;4: Min guard Ambulation Distance (Feet): 200 Feet Assistive device: 1 person hand held assist Ambulation/Gait Assistance Details: intermittent hand hold initially, last 100 feet pt did not have support. Gait Pattern: Step-through pattern;Decreased stride length;Decreased trunk rotation Gait velocity: wfl    Exercises     PT Diagnosis: Generalized weakness  PT Problem List: Decreased strength;Decreased activity tolerance;Decreased balance;Decreased mobility;Decreased knowledge of use of DME;Decreased knowledge of precautions PT Treatment Interventions: DME instruction;Gait training;Functional mobility training;Therapeutic activities;Therapeutic exercise;Balance training;Patient/family education   PT Goals Acute Rehab PT Goals PT Goal Formulation: With patient Time For Goal  Achievement: 05/10/12 Potential to Achieve Goals: Good Pt will Ambulate: >150 feet;with modified independence;with least restrictive assistive device PT Goal: Ambulate - Progress: Goal set today Pt will Perform Home Exercise Program: Independently PT Goal: Perform Home Exercise Program - Progress: Goal set today  Visit Information  Last PT Received On: 04/26/12 Assistance Needed: +1    Subjective Data  Subjective: I would rather go home. Patient Stated Goal: same.   Prior Functioning  Home Living Lives With: Alone Type of Home: Independent living facility Home Access: Elevator Home Layout: One level Bathroom Shower/Tub: Engineer, manufacturing systems: Standard Home Adaptive Equipment: Grab bars in shower Prior Function Level of Independence: Independent Driving: No Communication Communication: No difficulties    Cognition  Cognition Overall Cognitive Status: Appears within functional limits for tasks assessed/performed Arousal/Alertness: Awake/alert Orientation Level: Appears intact for tasks assessed Behavior During Session: [facility] for tasks performed    Extremity/Trunk Assessment Right Upper Extremity Assessment RUE ROM/Strength/Tone: [facility] for tasks assessed Left Upper Extremity Assessment LUE ROM/Strength/Tone: [facility] for tasks assessed Right Lower Extremity Assessment RLE ROM/Strength/Tone: WFL for tasks assessed RLE Sensation: Deficits RLE Sensation Deficits: relates some decr. sensation but feels LT Left Lower Extremity Assessment LLE ROM/Strength/Tone: WFL for tasks assessed LLE Sensation: Deficits LLE Sensation Deficits: same as R Trunk Assessment Trunk Assessment: Kyphotic   Balance Balance Balance Assessed: Yes Static Standing Balance Static Standing - Balance Support: No upper extremity supported Static Standing - Level of Assistance: 5: Stand by assistance  End of Session PT - End of Session Patient left: in chair;with call bell/phone within reach Nurse  Communication: Mobility status  GP     Rada Hay 04/26/2012, 11:54 AM [phone]

## 2012-04-26 NOTE — Progress Notes (Signed)
CSW received report from Doreen Salvage, ICU CSW re: concerns about pt self care, pt daughter's concerns, and [facility] APS involvement with pt.   CSW reviewed chart and noted that PT/OT recommendation is for home health PT/OT.   CSW discussed with RNCM who assisted with home health needs.  CSW spoke with [facility] care manager re: if pt would be appropriate for [facility] care management and patient is not eligible for [facility] Care Management services because her PCP is not a [facility] primary care provider or is not [facility] affiliated.  CSW met with pt at bedside to discuss discharge home and provide resources for ALF/private duty care agencies. CSW discussed resources and pt agreeable to accepting resources, but states that she does not yet feel that she needs ALF placement. CSW discussed that a Child psychotherapist would be included in home health services to assist with any further resources from home and pt is agreeable to this.   CSW offered pt to call pt daughter to discuss the resources that were provided and pt asked that CSW not contact pt daughter at this time. Pt reports that pt daughter plans to transport pt to home today and agreeable to notifying this CSW if pt daughter has any questions when she arrives to transport pt.  CSW noted that pt provided permission to [facility] to contact pt daughter and RNCM was able to address pt daughters concerns and notify pt daughter that home health will be following pt at home.  CSW contacted [facility] APS case worker, Arnetha Courser to notify APS that pt is discharging home today with home health services. CSW discussed with APS case worker the resources provided to pt and the disciplines that will be involved in pt home health needs.  No further social work needs identified at this time.  CSW signing off.   Jacklynn Lewis, MSW, LCSWA  Clinical Social Work [phone]

## 2012-04-26 NOTE — Discharge Summary (Addendum)
Physician Discharge Summary  Elizabeth [name] UJW:[id number] DOB: 1946-01-03 DOA: 04/24/2012  PCP: [name], MD  Admit date: 04/24/2012 Discharge date: 04/26/2012  Time spent: 35 minutes  Recommendations for Outpatient Follow-up:  1. Home health PT/OT/RN/social worker  Discharge Diagnoses:  Principal Problem:   Diabetic hypoglycemia Active Problems:   Hypothermia   HLD (hyperlipidemia)   Diabetes type 2, controlled   CKD (chronic kidney disease)   Anemia of chronic disease   Discharge Condition: improved  Diet recommendation: diabetic  Filed Weights   04/24/12 2121  Weight: 45.4 kg (100 lb 1.4 oz)    History of present illness:  Patient is a 67 year old female with past medical history most significant for diabetes 2 on insulin, chronic kidney disease stage III, arthritis, hyperlipidemia, anemia who was brought in by [name] after she collapsed in a restaurant. Patient usually inject 6-12 units of insulin NovoLog prior to her meals. She tells me that her CBG was 200 and she injected herself 8 units NovoLog. She was waiting for her meal when she suddenly collapsed. Her CBG was found to be 27 by [name].  Patient was brought to [facility] ER where she was resuscitated. I was asked to admit the patient for continued hypothermia up to 92 Fahrenheit.   Hospital Course:  DM- seen by diabetic ccordinator: correction scale that begins at 150 mg/dL i.e.  562-130, give 1 unit, 201-250, give 2 units 251-300, give 4 units, and greater than 300, give 6 units.   This scale she could use tid whether she eats or not. Then her meal coverage/ po (carb) intake would be covered with Novolog based on her servings of carb intake, which based on her weight is approx 1 unit for every 1 serving (15 grams) carbohydrate. She has an understanding of carbohydrates.  Would advise that patient take her meal coverage after she has eaten and feels comfortable that she will not regurgitate.  If she has home health,  the RN could assist her further with counting carbs and servings of carbs.  lantus increased to 15 units (possibly 20 units) based on her weight;due to renal complications, might start with 15 units and increase towards 20 units until fasting is at least down to 150 mg/dL. Patient to check BS at home and bring to PCP   Chronic indwelling foley- no fevers, no WBC- will defer to urology as outpatient, patient due for permanent catheter as an outpatient  Severe malnutrition   Procedures:    Consultations:  Social work- APS   Discharge Exam: Filed Vitals:   04/25/12 1800 04/25/12 2110 04/26/12 0512 04/26/12 1421  BP: 146/71 140/70 148/75 122/65  Pulse: 111 107 108 106  Temp:  98.9 F (37.2 C) 98.7 F (37.1 C) 98.1 F (36.7 C)  TempSrc:  Oral Oral Oral  Resp: 18 16 16 16   Height:      Weight:      SpO2: 99% 100% 95% 100%    General: A+OX3, NAD Cardiovascular: rrr Respiratory: clear anterior  Discharge Instructions      Discharge Orders   Future Appointments Provider Department Dept Phone   05/08/2012 10:30 AM [facility] Injection Room [facility] [phone]   Future Orders Complete By Expires     Diet Carb Modified  As directed     Discharge instructions  As directed     Comments:      Diabetic regimen: lantus daily XBM:[id number] scale of 150-200, give 1 unit, 201-250, give 2 units  251-300, give 4 units  and greater than 300, give 6 units plus novolog based on carb intake: meal coverage/ po (carb) intake would be covered with Novolog based on her servings of carb intake, which based on her weight is approx 1 unit for every 1 serving (15 grams)  carbohydrate Home health PT/OT/RN/aid/ social worker Record BS and bring to PCP    Increase activity slowly  As directed         Medication List    STOP taking these medications       insulin aspart protamine-insulin aspart (70-30) 100 UNIT/ML injection  Commonly known as:  NOVOLOG  70/30      TAKE these medications       furosemide 40 MG tablet  Commonly known as:  LASIX  Take 40 mg by mouth daily.     insulin aspart 100 UNIT/ML injection  Commonly known as:  novoLOG  150-200, give 1 unit, 201-250, give 2 units 251-300, give 4 units and greater than 300 give 6 units plus  meal coverage/ po (carb) intake would be covered with Novolog based on her servings of carb intake, which based on her weight is approx 1 unit for every 1 serving (15 grams) carbohydrate     insulin glargine 100 UNIT/ML injection  Commonly known as:  LANTUS  Inject 15 Units into the skin daily.     ondansetron 4 MG tablet  Commonly known as:  ZOFRAN  Take 1 tablet (4 mg total) by mouth every 6 (six) hours as needed for nausea.     pravastatin 20 MG tablet  Commonly known as:  PRAVACHOL  Take 20 mg by mouth at bedtime.          The results of significant diagnostics from this hospitalization (including imaging, microbiology, ancillary and laboratory) are listed below for reference.    Significant Diagnostic Studies: Dg Chest 2 View  03/28/2012  *RADIOLOGY REPORT*  Clinical Data: Preoperative assessment for insertion of a suprapubic catheter, diabetes, cough, shortness of breath with exertion  CHEST - 2 VIEW  Comparison: None  Findings: Upper normal heart size. Tortuous aorta. Pulmonary vascularity normal. Lungs clear. No pleural effusion or pneumothorax. No acute osseous findings. Scattered endplate spur formation thoracic spine.  IMPRESSION: No acute abnormalities.   Original Report Authenticated By: Ulyses Southward, M.D.     Microbiology: Recent Results (from the past 240 hour(s))  CULTURE, BLOOD (ROUTINE X 2)     Status: None   Collection Time    04/24/12  5:20 PM      Result Value Range Status   Specimen Description BLOOD RIGHT ARM   Final   Special Requests BOTTLES DRAWN AEROBIC AND ANAEROBIC 5CC   Final   Culture  Setup Time 04/24/2012 23:41   Final   Culture     Final   Value:         BLOOD CULTURE RECEIVED NO GROWTH TO DATE CULTURE WILL BE HELD FOR 5 DAYS BEFORE ISSUING A FINAL NEGATIVE REPORT   Report Status PENDING   Incomplete  CULTURE, BLOOD (ROUTINE X 2)     Status: None   Collection Time    04/24/12  5:25 PM      Result Value Range Status   Specimen Description BLOOD RIGHT WRIST   Final   Special Requests BOTTLES DRAWN AEROBIC AND ANAEROBIC 5CC   Final   Culture  Setup Time 04/24/2012 23:41   Final   Culture     Final  Value:        BLOOD CULTURE RECEIVED NO GROWTH TO DATE CULTURE WILL BE HELD FOR 5 DAYS BEFORE ISSUING A FINAL NEGATIVE REPORT   Report Status PENDING   Incomplete  URINE CULTURE     Status: None   Collection Time    04/24/12  5:40 PM      Result Value Range Status   Specimen Description URINE, CATHETERIZED   Final   Special Requests NONE   Final   Culture  Setup Time 04/24/2012 23:56   Final   Colony Count >=100,000 COLONIES/ML   Final   Culture GRAM NEGATIVE RODS   Final   Report Status PENDING   Incomplete  MRSA PCR SCREENING     Status: None   Collection Time    04/24/12 10:54 PM      Result Value Range Status   MRSA by PCR NEGATIVE  NEGATIVE Final   Comment:            The GeneXpert MRSA Assay (FDA     approved for NASAL specimens     only), is one component of a     comprehensive MRSA colonization     surveillance program. It is not     intended to diagnose MRSA     infection nor to guide or     monitor treatment for     MRSA infections.     Labs: Basic Metabolic Panel:  Recent Labs Lab 04/24/12 1727 04/25/12 0330  NA 138 134*  K 2.8* 4.5  CL 102 100  CO2 23 24  GLUCOSE 341* 245*  BUN 42* 41*  CREATININE 2.15* 2.12*  CALCIUM 8.7 8.4   Liver Function Tests:  Recent Labs Lab 04/24/12 1727  AST 31  ALT 32  ALKPHOS 106  BILITOT 0.2*  PROT 6.6  ALBUMIN 2.6*   No results found for this basename: LIPASE, AMYLASE,  in the last 168 hours No results found for this basename: AMMONIA,  in the last 168  hours CBC:  Recent Labs Lab 04/24/12 1001 04/24/12 1727 04/25/12 0330 04/26/12 1005  WBC  --  8.0 6.0 6.0  HGB 10.7* 9.7* 9.8* 10.6*  HCT  --  31.0* 30.9* 33.8*  MCV  --  82.2 81.5 83.3  PLT  --  260 258 266   Cardiac Enzymes: No results found for this basename: CKTOTAL, CKMB, CKMBINDEX, TROPONINI,  in the last 168 hours BNP: BNP (last 3 results)  Recent Labs  01/04/12 1716  PROBNP 599.7*   CBG:  Recent Labs Lab 04/25/12 1234 04/25/12 1605 04/25/12 2202 04/26/12 0743 04/26/12 1151  GLUCAP 215* 240* 175* 219* 290*       Signed:  VANN, JESSICA  [facility] 04/26/2012, 3:00 PM

## 2012-04-26 NOTE — Progress Notes (Signed)
Chart review complete.  Patient is not eligible for [facility] Care Management services because her PCP is not a [facility] primary care provider or is not [facility] affiliated.  For any additional questions or new referrals please contact Tim Henderson BSN RN [facility] Liaison at [phone] °

## 2012-04-29 ENCOUNTER — Telehealth (HOSPITAL_COMMUNITY): Payer: Self-pay | Admitting: Emergency Medicine

## 2012-04-29 LAB — URINE CULTURE

## 2012-04-30 LAB — CULTURE, BLOOD (ROUTINE X 2): Culture: NO GROWTH

## 2012-05-07 ENCOUNTER — Other Ambulatory Visit: Payer: Self-pay | Admitting: Urology

## 2012-05-08 ENCOUNTER — Encounter (HOSPITAL_COMMUNITY): Payer: Self-pay | Admitting: Pharmacy Technician

## 2012-05-08 ENCOUNTER — Encounter (HOSPITAL_COMMUNITY)
Admission: RE | Admit: 2012-05-08 | Discharge: 2012-05-08 | Disposition: A | Discharge status: Home / Self Care | Payer: Medicare Other | Source: Ambulatory Visit | Attending: Nephrology | Admitting: Nephrology

## 2012-05-08 ENCOUNTER — Telehealth (HOSPITAL_COMMUNITY): Payer: Self-pay | Admitting: Emergency Medicine

## 2012-05-08 LAB — POCT HEMOGLOBIN-HEMACUE: Hemoglobin: 13.2 g/dL (ref 12.0–15.0)

## 2012-05-08 MED ORDER — EPOETIN ALFA 20000 UNIT/ML IJ SOLN: 20000.0000 [IU] | INTRAMUSCULAR | Status: DC

## 2012-05-14 ENCOUNTER — Encounter (HOSPITAL_COMMUNITY): Payer: Self-pay

## 2012-05-14 ENCOUNTER — Encounter (HOSPITAL_COMMUNITY)
Admission: RE | Admit: 2012-05-14 | Discharge: 2012-05-14 | Disposition: A | Discharge status: Home / Self Care | Payer: Medicare Other | Source: Ambulatory Visit | Attending: Urology | Admitting: Urology

## 2012-05-14 LAB — CBC
Hemoglobin: 13.8 g/dL (ref 12.0–15.0)
MCHC: 31.7 g/dL (ref 30.0–36.0)
Platelets: 228 10*3/uL (ref 150–400)

## 2012-05-14 LAB — BASIC METABOLIC PANEL
GFR calc Af Amer: 20 mL/min — ABNORMAL LOW (ref >90)
GFR calc non Af Amer: 17 mL/min — ABNORMAL LOW (ref >90)
Glucose, Bld: 275 mg/dL — ABNORMAL HIGH (ref 70–99)
Potassium: 4.8 mEq/L (ref 3.5–5.1)
Sodium: 140 mEq/L (ref 135–145)

## 2012-05-14 LAB — SURGICAL PCR SCREEN
MRSA, PCR: NEGATIVE
Staphylococcus aureus: POSITIVE — AB

## 2012-05-14 NOTE — Progress Notes (Signed)
LOV Dr Briant Cedar 3/14 on chart

## 2012-05-14 NOTE — Progress Notes (Signed)
Chest x ray 2/14 EPIC

## 2012-05-14 NOTE — Progress Notes (Signed)
OV with clearance and note Dr Rennis Golden 05/04/12 on chart, EKG 2/14 chart,stress test 2/14 on chart.  Patient repeated back to me instructions regarding 1/2 dose insulin night before surgery, snack before midnight and NO INSULIN MORNING OF SURGERY

## 2012-05-14 NOTE — Patient Instructions (Addendum)
20 Elizabeth [name]  05/14/2012   Your procedure is scheduled on:  05/16/12  [facility]  Report to [facility] at   0630    AM.  Call this number if you have problems the morning of surgery: [phone]       TAKE ONE HALF DOSAGE OF INSULIN  Tuesday NIGHT-  HAVE SNACK BEFORE BEDTIME OR MIDNIGHT Tuesday NIGHT  Do not eat food  Or drink :After Midnight. Tuesday NIGHT   Take these medicines the morning of surgery with A SIP OF WATER: CARVEDILOL DO NOT TAKE ANY INSULIN  Wednesday MORNING AT HOME  .  Contacts, dentures or partial plates can not be worn to surgery  Leave suitcase in the car. After surgery it may be brought to your room.  For patients admitted to the hospital, checkout time is 11:00 AM day of  discharge.             SPECIAL INSTRUCTIONS- SEE [place] PREPARING FOR SURGERY INSTRUCTION SHEET-     DO NOT WEAR JEWELRY, LOTIONS, POWDERS, OR PERFUMES.  WOMEN-- DO NOT SHAVE LEGS OR UNDERARMS FOR 12 HOURS BEFORE SHOWERS. MEN MAY SHAVE FACE.  Patients discharged the day of surgery will not be allowed to drive home. IF going home the day of surgery, you must have a driver and someone to stay with you for the first 24 hours  Name and phone number of your driver: Elizabeth [name]   [phone]                                                                       Please read over the following fact sheets that you were given: MRSA Information, Incentive Spirometry Sheet, Blood Transfusion Sheet  Information                                                                                   Elizabeth [name]  PST 336  [phone]                 FAILURE TO FOLLOW THESE INSTRUCTIONS MAY RESULT IN  CANCELLATION   OF YOUR SURGERY                                                  Patient Signature _____________________________

## 2012-05-14 NOTE — Progress Notes (Signed)
Faxed abnormal BMET to Dr Unknown Foley through [facility], also left message with Monica Martinez at [facility] of fax with abnormal labe for review.  Eccho 11/13 on chart

## 2012-05-15 MED ORDER — PIPERACILLIN-TAZOBACTAM 3.375 G IVPB 30 MIN
3.3750 g | Freq: Once | INTRAVENOUS | Status: AC
  Administered 2012-05-16: 3.375 g via INTRAVENOUS
  Filled 2012-05-15: qty 50

## 2012-05-15 NOTE — Anesthesia Preprocedure Evaluation (Addendum)
Anesthesia Evaluation  Patient identified by MRN, date of birth, ID band Patient awake    Reviewed: Allergy & Precautions, H&P , NPO status , Patient's Chart, lab work & pertinent test results  Airway Mallampati: II TM Distance: >3 FB Neck ROM: Full    Dental  (+) Missing and Loose Right upper incisor is slightly loose. No lower teeth.:   Pulmonary shortness of breath,  breath sounds clear to auscultation  Pulmonary exam normal       Cardiovascular negative cardio ROS  Rhythm:Regular Rate:Normal  ECG:03/28/12: ST 128, question infarct.  CXR: OK  Cardiology: EF 44%. Cardiac cath recommended but unable to perform due to chronic kidney disease.  Low to intermediate risk.   Neuro/Psych negative neurological ROS  negative psych ROS   GI/Hepatic negative GI ROS, Neg liver ROS,   Endo/Other  diabetes, Type 2, Insulin Dependent  Renal/GU Renal diseaseChronic kidney disease stage 3.  Cr 2.66   negative genitourinary   Musculoskeletal negative musculoskeletal ROS (+)   Abdominal   Peds negative pediatric ROS (+)  Hematology negative hematology ROS (+)   Anesthesia Other Findings   Reproductive/Obstetrics negative OB ROS                       Anesthesia Physical Anesthesia Plan  ASA: III  Anesthesia Plan: General   Post-op Pain Management:    Induction: Intravenous  Airway Management Planned:   Additional Equipment:   Intra-op Plan:   Post-operative Plan: Extubation in OR  Informed Consent: I have reviewed the patients History and Physical, chart, labs and discussed the procedure including the risks, benefits and alternatives for the proposed anesthesia with the patient or authorized representative who has indicated his/her understanding and acceptance.   Dental advisory given  Plan Discussed with: CRNA  Anesthesia Plan Comments:         Anesthesia Quick Evaluation

## 2012-05-16 ENCOUNTER — Encounter (HOSPITAL_COMMUNITY)
Admission: RE | Disposition: A | Discharge status: Home / Self Care | Payer: Self-pay | Source: Ambulatory Visit | Attending: Urology

## 2012-05-16 ENCOUNTER — Encounter (HOSPITAL_COMMUNITY): Payer: Self-pay | Admitting: Anesthesiology

## 2012-05-16 ENCOUNTER — Ambulatory Visit (HOSPITAL_COMMUNITY)
Admission: RE | Admit: 2012-05-16 | Discharge: 2012-05-16 | Disposition: A | Discharge status: Home / Self Care | Payer: Medicare Other | Source: Ambulatory Visit | Attending: Urology | Admitting: Urology

## 2012-05-16 ENCOUNTER — Ambulatory Visit (HOSPITAL_COMMUNITY): Payer: Medicare Other | Admitting: Anesthesiology

## 2012-05-16 ENCOUNTER — Encounter (HOSPITAL_COMMUNITY): Payer: Self-pay | Admitting: *Deleted

## 2012-05-16 ENCOUNTER — Ambulatory Visit: Payer: Medicare Other | Admitting: Dietician

## 2012-05-16 DIAGNOSIS — Z794 Long term (current) use of insulin: Secondary | ICD-10-CM | POA: Insufficient documentation

## 2012-05-16 DIAGNOSIS — N184 Chronic kidney disease, stage 4 (severe): Secondary | ICD-10-CM | POA: Insufficient documentation

## 2012-05-16 DIAGNOSIS — E785 Hyperlipidemia, unspecified: Secondary | ICD-10-CM | POA: Insufficient documentation

## 2012-05-16 DIAGNOSIS — R32 Unspecified urinary incontinence: Secondary | ICD-10-CM | POA: Insufficient documentation

## 2012-05-16 DIAGNOSIS — N319 Neuromuscular dysfunction of bladder, unspecified: Secondary | ICD-10-CM | POA: Insufficient documentation

## 2012-05-16 DIAGNOSIS — E1142 Type 2 diabetes mellitus with diabetic polyneuropathy: Secondary | ICD-10-CM | POA: Insufficient documentation

## 2012-05-16 DIAGNOSIS — E1149 Type 2 diabetes mellitus with other diabetic neurological complication: Secondary | ICD-10-CM | POA: Insufficient documentation

## 2012-05-16 DIAGNOSIS — D649 Anemia, unspecified: Secondary | ICD-10-CM | POA: Insufficient documentation

## 2012-05-16 DIAGNOSIS — M129 Arthropathy, unspecified: Secondary | ICD-10-CM | POA: Insufficient documentation

## 2012-05-16 HISTORY — PX: INSERTION OF SUPRAPUBIC CATHETER: SHX5870

## 2012-05-16 LAB — GLUCOSE, CAPILLARY
Glucose-Capillary: 111 mg/dL — ABNORMAL HIGH (ref 70–99)
Glucose-Capillary: 90 mg/dL (ref 70–99)

## 2012-05-16 SURGERY — INSERTION, SUPRAPUBIC CATHETER
Anesthesia: General | Wound class: Clean Contaminated

## 2012-05-16 MED ORDER — ONDANSETRON HCL 4 MG/2ML IJ SOLN
INTRAMUSCULAR | Status: DC | PRN
  Administered 2012-05-16: 4 mg via INTRAVENOUS

## 2012-05-16 MED ORDER — LIDOCAINE HCL 1 % IJ SOLN
INTRAMUSCULAR | Status: AC
  Filled 2012-05-16: qty 20

## 2012-05-16 MED ORDER — STERILE WATER FOR IRRIGATION IR SOLN
Status: DC | PRN
  Administered 2012-05-16: 3000 mL

## 2012-05-16 MED ORDER — 0.9 % SODIUM CHLORIDE (POUR BTL) OPTIME
TOPICAL | Status: DC | PRN
  Administered 2012-05-16: 1000 mL

## 2012-05-16 MED ORDER — PROPOFOL 10 MG/ML IV BOLUS
INTRAVENOUS | Status: DC | PRN
  Administered 2012-05-16: 110 mg via INTRAVENOUS

## 2012-05-16 MED ORDER — PROMETHAZINE HCL 25 MG/ML IJ SOLN: 6.2500 mg | INTRAMUSCULAR | Status: DC | PRN

## 2012-05-16 MED ORDER — STERILE WATER FOR IRRIGATION IR SOLN
Status: DC | PRN
  Administered 2012-05-16: 500 mL

## 2012-05-16 MED ORDER — SENNA-DOCUSATE SODIUM 8.6-50 MG PO TABS: 1.0000 | ORAL_TABLET | Freq: Two times a day (BID) | ORAL | Status: DC

## 2012-05-16 MED ORDER — LIDOCAINE HCL (PF) 1 % IJ SOLN
INTRAMUSCULAR | Status: DC | PRN
  Administered 2012-05-16: 9 mL

## 2012-05-16 MED ORDER — LACTATED RINGERS IV SOLN
INTRAVENOUS | Status: DC | PRN
  Administered 2012-05-16: 08:00:00 via INTRAVENOUS

## 2012-05-16 MED ORDER — LIDOCAINE HCL (CARDIAC) 20 MG/ML IV SOLN
INTRAVENOUS | Status: DC | PRN
  Administered 2012-05-16: 100 mg via INTRAVENOUS

## 2012-05-16 MED ORDER — FENTANYL CITRATE 0.05 MG/ML IJ SOLN: 25.0000 ug | INTRAMUSCULAR | Status: DC | PRN

## 2012-05-16 MED ORDER — TRAMADOL-ACETAMINOPHEN 37.5-325 MG PO TABS: 1.0000 | ORAL_TABLET | Freq: Four times a day (QID) | ORAL | Status: DC | PRN

## 2012-05-16 SURGICAL SUPPLY — 29 items
BAG URINE DRAINAGE (UROLOGICAL SUPPLIES) ×2 IMPLANT
BAG URO CATCHER STRL LF (DRAPE) ×2 IMPLANT
BLADE SURG ROTATE 9660 (MISCELLANEOUS) IMPLANT
CANISTER SUCT LVC 12 LTR MEDI- (MISCELLANEOUS) IMPLANT
CATH BONANNO SUPRAPUBIC 14G (CATHETERS) IMPLANT
CATH FOLEY 2WAY SLVR  5CC 16FR (CATHETERS) ×1
CATH FOLEY 2WAY SLVR  5CC 18FR (CATHETERS)
CATH FOLEY 2WAY SLVR 5CC 16FR (CATHETERS) ×1 IMPLANT
CATH FOLEY 2WAY SLVR 5CC 18FR (CATHETERS) IMPLANT
CLOTH BEACON ORANGE TIMEOUT ST (SAFETY) ×2 IMPLANT
DRAPE CAMERA CLOSED 9X96 (DRAPES) ×2 IMPLANT
ELECT REM PT RETURN 9FT ADLT (ELECTROSURGICAL) ×2
ELECTRODE REM PT RTRN 9FT ADLT (ELECTROSURGICAL) ×1 IMPLANT
GLOVE BIO SURGEON STRL SZ8 (GLOVE) ×2 IMPLANT
GOWN STRL REIN XL XLG (GOWN DISPOSABLE) ×2 IMPLANT
HOLDER FOLEY CATH W/STRAP (MISCELLANEOUS) ×2 IMPLANT
IV NS IRRIG 3000ML ARTHROMATIC (IV SOLUTION) IMPLANT
KIT SUPRAPUBIC CATH (MISCELLANEOUS) IMPLANT
NEEDLE HYPO 25X1 1.5 SAFETY (NEEDLE) ×2 IMPLANT
PACK CYSTO (CUSTOM PROCEDURE TRAY) ×2 IMPLANT
PENCIL BUTTON HOLSTER BLD 10FT (ELECTRODE) IMPLANT
SPONGE GAUZE 4X4 12PLY (GAUZE/BANDAGES/DRESSINGS) ×2 IMPLANT
SUT ETHILON 3 0 PS 1 (SUTURE) ×2 IMPLANT
SUT SILK 0 (SUTURE) ×1
SUT SILK 0 30XBRD TIE 6 (SUTURE) ×1 IMPLANT
SUT SILK 2 0 FS (SUTURE) IMPLANT
SYRINGE IRR TOOMEY STRL 70CC (SYRINGE) IMPLANT
TAPE CLOTH SURG 4X10 WHT LF (GAUZE/BANDAGES/DRESSINGS) ×2 IMPLANT
WATER STERILE IRR 3000ML UROMA (IV SOLUTION) IMPLANT

## 2012-05-16 NOTE — Anesthesia Postprocedure Evaluation (Signed)
  Anesthesia Post-op Note  Patient: Elizabeth [name]  Procedure(s) Performed: Procedure(s) (LRB): INSERTION OF SUPRAPUBIC CATHETER (N/A)  Patient Location: PACU  Anesthesia Type: General  Level of Consciousness: awake and alert   Airway and Oxygen Therapy: Patient Spontanous Breathing  Post-op Pain: mild  Post-op Assessment: Post-op Vital signs reviewed, Patient's Cardiovascular Status Stable, Respiratory Function Stable, Patent Airway and No signs of Nausea or vomiting  Last Vitals:  Filed Vitals:   05/16/12 0915  BP: 172/79  Pulse: 86  Temp:   Resp: 13    Post-op Vital Signs: stable   Complications: No apparent anesthesia complications

## 2012-05-16 NOTE — H&P (Signed)
Elizabeth [name] is an 66 y.o. female.    Chief Complaint: Pre-OP Suprapubic Tube Placement  HPI:   1 - Urinary Retention / Acontractile - Neurogenic Bladder - Pt seen in ER 12/2011 for "inability to urinate". Is very poorly controlled diabetic on insulin with neuropathy and LE ulcers.  Pelvic 01/2012 with cystocele, no mass. PVR 01/2012 230cc w/o sensation during trial of void. Urodynamics 03/05/2012 confirmed completely acontractile bladder with passage of urine only with overflow at 500 cc. This was with and without reduction of cystocele.  2 - Chronic Renal Insuficiency - Cr 12/2011 2.2. No prior values to compare. Poorly controlled diabetic. Renal US 12/2011 wtih chronic kidney changes, no hydro, consistant with medical renal disease.  PMH sig for IDDM2 with poor control, hemorroids. No surgeries.   Today Elizabeth [name] is seen to proceed with SP tube formation. She had medical clearance. Most recent UCX with pan-sensitive e. Coli for which she has been os CX-specific treatment.  Past Medical History  Diagnosis Date  . Hyperlipidemia   . Shortness of breath     WITH EXERTION  . Diabetes mellitus without complication     TAKES INSULIN   . Urinary retention     DX NEUROGENIC BLADDER--HAS INDWELLING FOLEY CATHETER  . Arthritis   . Anemia   . Chronic kidney disease     PT TOLD RECENT TESTING SHOWS KIDNEY DAMAGE--IS SEEING NEPHROLOGIST.  OFFICE NOTES OBTAINED DR. MATTINGLY -"CHRONIC KIDNEY DISEASE 4 MOST LIKELY SECONDARY TO DIABETES"    Past Surgical History  Procedure Laterality Date  . No previous surgery      No family history on file. Social History:  reports that she has never smoked. She has never used smokeless tobacco. She reports that  drinks alcohol. She reports that she does not use illicit drugs.  Allergies: No Known Allergies  No prescriptions prior to admission    Results for orders placed during the hospital encounter of 05/14/12 (from the past 48 hour(s))  SURGICAL PCR  SCREEN     Status: Abnormal   Collection Time    05/14/12 11:15 AM      Result Value Range   MRSA, PCR NEGATIVE  NEGATIVE   Staphylococcus aureus POSITIVE (*) NEGATIVE   Comment:            The Xpert SA Assay (FDA     approved for NASAL specimens     in patients over 32 years of age),     is one component of     a comprehensive surveillance     program.  Test performance has     been validated by [employer] for patients greater     than or equal to 55 year old.     It is not intended     to diagnose infection nor to     guide or monitor treatment.  CBC     Status: Abnormal   Collection Time    05/14/12 11:35 AM      Result Value Range   WBC 7.6  4.0 - 10.5 K/uL   RBC 5.27 (*) 3.87 - 5.11 MIL/uL   Hemoglobin 13.8  12.0 - 15.0 g/dL   HCT 16.1  09.6 - 04.5 %   MCV 82.5  78.0 - 100.0 fL   MCH 26.2  26.0 - 34.0 pg   MCHC 31.7  30.0 - 36.0 g/dL   RDW 40.9 (*) 81.1 - 91.4 %   Platelets 228  150 -  400 K/uL  BASIC METABOLIC PANEL     Status: Abnormal   Collection Time    05/14/12 11:35 AM      Result Value Range   Sodium 140  135 - 145 mEq/L   Potassium 4.8  3.5 - 5.1 mEq/L   Chloride 96  96 - 112 mEq/L   CO2 34 (*) 19 - 32 mEq/L   Glucose, Bld 275 (*) 70 - 99 mg/dL   BUN 79 (*) 6 - 23 mg/dL   Creatinine, Ser 1.61 (*) 0.50 - 1.10 mg/dL   Calcium 9.9  8.4 - 09.6 mg/dL   GFR calc non Af Amer 17 (*) >90 mL/min   GFR calc Af Amer 20 (*) >90 mL/min   Comment:            The eGFR has been calculated     using the CKD EPI equation.     This calculation has not been     validated in all clinical     situations.     eGFR's persistently     <90 mL/min signify     possible Chronic Kidney Disease.   No results found.  Review of Systems  Constitutional: Negative.  Negative for fever and chills.  HENT: Negative.   Eyes: Negative.   Respiratory: Negative.   Cardiovascular: Negative.   Gastrointestinal: Negative.   Genitourinary: Negative.  Negative for dysuria and flank  pain.  Musculoskeletal: Negative.   Skin: Negative.   Neurological: Negative.   Endo/Heme/Allergies: Negative.   Psychiatric/Behavioral: Negative.     There were no vitals taken for this visit. Physical Exam  Constitutional: She is oriented to person, place, and time. She appears well-developed and well-nourished.  HENT:  Head: Normocephalic and atraumatic.  Eyes: EOM are normal. Pupils are equal, round, and reactive to light.  Neck: Normal range of motion. Neck supple.  Cardiovascular: Normal rate and regular rhythm.   Respiratory: Effort normal and breath sounds normal.  GI: Soft. Bowel sounds are normal.  Genitourinary:  No CVAT  Musculoskeletal: Normal range of motion.  Neurological: She is alert and oriented to person, place, and time.  Skin: Skin is warm and dry.  Psychiatric: She has a normal mood and affect. Her behavior is normal. Judgment and thought content normal.     Assessment/Plan  1 - Urinary Retention / Acontractile - Neurogenic Bladder -  Patient has opted to proceed with trial of SP Tube. Will arrange as outpatient. Risks including bleeding, infection, damage to bowel re-discussed as well as need for Qmonthly changes indefinately.   2 - Chronic Renal Insuficiency - secondary to medical renal disease. Now seeing nephrology.  Elizabeth [name] 05/16/2012, 6:02 AM

## 2012-05-16 NOTE — Brief Op Note (Signed)
05/16/2012  8:32 AM  PATIENT:  Elizabeth [name]  67 y.o. female  PRE-OPERATIVE DIAGNOSIS:  NEUROGENIC BLADDER  POST-OPERATIVE DIAGNOSIS:  NEUROGENIC BLADDER  PROCEDURE:  Procedure(s): INSERTION OF SUPRAPUBIC CATHETER (N/A), cystoscopy  SURGEON:  Surgeon(s) and Role:    * Sebastian Ache, MD - Primary  PHYSICIAN ASSISTANT:   ASSISTANTS: none   ANESTHESIA:   local and general  EBL:     BLOOD ADMINISTERED:none  DRAINS: 29F SP tube   LOCAL MEDICATIONS USED:  LIDOCAINE   SPECIMEN:  No Specimen  DISPOSITION OF SPECIMEN:  N/A  COUNTS:  YES  TOURNIQUET:  * No tourniquets in log *  DICTATION: .Other Dictation: Dictation Number [id number]  PLAN OF CARE: Discharge to home after PACU  PATIENT DISPOSITION:  PACU - hemodynamically stable.   Delay start of Pharmacological VTE agent (>24hrs) due to surgical blood loss or risk of bleeding: not applicable

## 2012-05-16 NOTE — Op Note (Signed)
NAMEMarland [name]  RHODA, WALDVOGEL NO.:  [id number]  MEDICAL RECORD NO.:  [id number]  LOCATION:  [facility]                         FACILITY:  [facility]  PHYSICIAN:  Sebastian Ache, MD     DATE OF BIRTH:  08/08/1945  DATE OF PROCEDURE: 05/16/2012 DATE OF DISCHARGE:  05/16/2012                              OPERATIVE REPORT   DIAGNOSIS:  Neurogenic bladder.  PROCEDURE:  Cystoscopy with insertion of suprapubic tube.  FIRST ASSISTANT:  None.  SPECIMENS:  None.  COMPLICATIONS:  None.  FINDINGS:  Unremarkable urinary bladder.  Excellent placement of suprapubic tube into the anterior wall.  ESTIMATED BLOOD LOSS:  Nil.  INDICATIONS:  [name] is a pleasant 67 year old lady with history of a very poorly-controlled diabetes with subsequent poor renal function and multiple neuropathies including a neurogenic bladder.  This is documented by urodynamics on workup for refractory incontinence.  She was found only void with overflow.  Options were discussed for chronic management including intermittent self-catheterization versus chronic Foley versus suprapubic tube.  She wished to proceed with the latter. Informed consent was obtained and placed in the medical record.  PROCEDURE IN DETAIL:  The patient being [name], procedure being cystoscopy and insertion of suprapubic tube was confirmed.  Procedure was carried out.  Time-out was performed.  Intravenous antibiotics administered.  General LMA anesthesia was introduced.  The patient was placed into a low lithotomy position.  Sterile field created by prepping and draping the patient's vagina, introitus, proximal thighs, and infraumbilical abdomen using iodine x3.  Next, cystourethroscopy was performed using a 22-French rigid cystoscope with 12-degree offset lens. Inspection of bladder revealed no diverticula, calcifications, or papular lesions.  There was mild edema associated with a chronic Foley catheter.  Ureteral orifices  were in normal anatomic position.  Bladder was filled in cystometric capacity.  The Lowslye type introducer was used in that position, 2 fingerbreadths above the symphysis, the midline was marked.  Bovie was brought into this location by hugging the anterior wall to avoid bladder folding.  Cutdown was performed directly on this with a scalpel blade.  The Lowsley was delivered to the space of Retzius and through the skin. A 16-French catheter was back loaded and brought out to the level of the urethra.  This was left back in cystoscopically and was filled with 10 mL.  The balloon visualized in the urinary bladder and also visualized to be in excellent position in the anterior wall without any evidence of bladder folding.  Nylon stitch was applied at the skin.  Hemostasis appeared excellent.  10 mL of lidocaine was infiltrated. The procedure was then terminated.  The patient tolerated the procedure well.  There were no immediate periprocedural complications.  The patient was taken to the postanesthesia care unit in a stable condition.          ______________________________ Sebastian Ache, MD     TM/MEDQ  D:  05/16/2012  T:  05/16/2012  Job:  [id number]

## 2012-05-16 NOTE — Transfer of Care (Signed)
Immediate Anesthesia Transfer of Care Note  Patient: Elizabeth [name]  Procedure(s) Performed: Procedure(s): INSERTION OF SUPRAPUBIC CATHETER (N/A)  Patient Location: PACU  Anesthesia Type:General  Level of Consciousness: sedated  Airway & Oxygen Therapy: Patient Spontanous Breathing and Patient connected to face mask oxygen  Post-op Assessment: Report given to PACU RN and Post -op Vital signs reviewed and stable  Post vital signs: Reviewed and stable  Complications: No apparent anesthesia complications

## 2012-05-17 ENCOUNTER — Encounter (HOSPITAL_COMMUNITY): Payer: Self-pay | Admitting: Urology

## 2012-05-17 NOTE — ED Notes (Signed)
Phone call done  Flint Melter, MD 05/17/12 [phone]

## 2012-05-17 NOTE — ED Notes (Signed)
Phone call done  Flint Melter, MD 05/17/12 0930

## 2012-05-22 ENCOUNTER — Encounter (HOSPITAL_COMMUNITY)
Admission: RE | Admit: 2012-05-22 | Discharge: 2012-05-22 | Disposition: A | Discharge status: Home / Self Care | Payer: Medicare Other | Source: Ambulatory Visit | Attending: Nephrology | Admitting: Nephrology

## 2012-05-22 DIAGNOSIS — D638 Anemia in other chronic diseases classified elsewhere: Secondary | ICD-10-CM | POA: Insufficient documentation

## 2012-05-22 DIAGNOSIS — N184 Chronic kidney disease, stage 4 (severe): Secondary | ICD-10-CM | POA: Insufficient documentation

## 2012-05-22 LAB — POCT HEMOGLOBIN-HEMACUE: Hemoglobin: 12.8 g/dL (ref 12.0–15.0)

## 2012-05-22 MED ORDER — EPOETIN ALFA 20000 UNIT/ML IJ SOLN: 20000.0000 [IU] | INTRAMUSCULAR | Status: DC

## 2012-05-31 ENCOUNTER — Ambulatory Visit: Payer: Medicare Other | Admitting: Dietician

## 2012-06-05 ENCOUNTER — Encounter (HOSPITAL_COMMUNITY): Payer: Medicare Other

## 2012-06-11 ENCOUNTER — Other Ambulatory Visit (HOSPITAL_COMMUNITY): Payer: Self-pay | Admitting: *Deleted

## 2012-06-12 ENCOUNTER — Encounter (HOSPITAL_COMMUNITY)
Admission: RE | Admit: 2012-06-12 | Discharge: 2012-06-12 | Disposition: A | Discharge status: Home / Self Care | Payer: Medicare Other | Source: Ambulatory Visit | Attending: Nephrology | Admitting: Nephrology

## 2012-06-12 LAB — POCT HEMOGLOBIN-HEMACUE: Hemoglobin: 12 g/dL (ref 12.0–15.0)

## 2012-06-12 MED ORDER — EPOETIN ALFA 20000 UNIT/ML IJ SOLN: 20000.0000 [IU] | INTRAMUSCULAR | Status: DC

## 2012-06-26 ENCOUNTER — Encounter (HOSPITAL_COMMUNITY): Payer: Medicare Other

## 2012-06-27 ENCOUNTER — Encounter (HOSPITAL_COMMUNITY)
Admission: RE | Admit: 2012-06-27 | Discharge: 2012-06-27 | Disposition: A | Discharge status: Home / Self Care | Payer: Medicare Other | Source: Ambulatory Visit | Attending: Nephrology | Admitting: Nephrology

## 2012-06-27 DIAGNOSIS — D638 Anemia in other chronic diseases classified elsewhere: Secondary | ICD-10-CM | POA: Insufficient documentation

## 2012-06-27 DIAGNOSIS — N184 Chronic kidney disease, stage 4 (severe): Secondary | ICD-10-CM | POA: Insufficient documentation

## 2012-06-27 LAB — POCT HEMOGLOBIN-HEMACUE: Hemoglobin: 11.8 g/dL — ABNORMAL LOW (ref 12.0–15.0)

## 2012-06-27 MED ORDER — EPOETIN ALFA 20000 UNIT/ML IJ SOLN: 20000.0000 [IU] | INTRAMUSCULAR | Status: DC

## 2012-07-11 ENCOUNTER — Encounter (HOSPITAL_COMMUNITY)
Admission: RE | Admit: 2012-07-11 | Discharge: 2012-07-11 | Disposition: A | Discharge status: Home / Self Care | Payer: Medicare Other | Source: Ambulatory Visit | Attending: Nephrology | Admitting: Nephrology

## 2012-07-11 LAB — IRON AND TIBC
Saturation Ratios: 32 % (ref 20–55)
TIBC: 225 ug/dL — ABNORMAL LOW (ref 250–470)

## 2012-07-11 LAB — FERRITIN: Ferritin: 518 ng/mL — ABNORMAL HIGH (ref 10–291)

## 2012-07-11 LAB — POCT HEMOGLOBIN-HEMACUE: Hemoglobin: 11.3 g/dL — ABNORMAL LOW (ref 12.0–15.0)

## 2012-07-11 MED ORDER — EPOETIN ALFA 20000 UNIT/ML IJ SOLN
20000.0000 [IU] | INTRAMUSCULAR | Status: DC
  Administered 2012-07-11: 20000 [IU] via SUBCUTANEOUS

## 2012-07-11 MED ORDER — EPOETIN ALFA 20000 UNIT/ML IJ SOLN
INTRAMUSCULAR | Status: AC
  Filled 2012-07-11: qty 1

## 2012-08-01 ENCOUNTER — Encounter (HOSPITAL_COMMUNITY): Payer: Medicare Other

## 2012-08-08 ENCOUNTER — Encounter (HOSPITAL_COMMUNITY)
Admission: RE | Admit: 2012-08-08 | Discharge: 2012-08-08 | Disposition: A | Discharge status: Home / Self Care | Payer: Medicare Other | Source: Ambulatory Visit | Attending: Nephrology | Admitting: Nephrology

## 2012-08-08 DIAGNOSIS — N184 Chronic kidney disease, stage 4 (severe): Secondary | ICD-10-CM | POA: Insufficient documentation

## 2012-08-08 DIAGNOSIS — D638 Anemia in other chronic diseases classified elsewhere: Secondary | ICD-10-CM | POA: Insufficient documentation

## 2012-08-08 MED ORDER — EPOETIN ALFA 20000 UNIT/ML IJ SOLN: 20000.0000 [IU] | INTRAMUSCULAR | Status: DC

## 2012-08-09 LAB — POCT HEMOGLOBIN-HEMACUE: Hemoglobin: 11.7 g/dL — ABNORMAL LOW (ref 12.0–15.0)

## 2012-08-22 ENCOUNTER — Encounter (HOSPITAL_COMMUNITY): Payer: Medicare Other

## 2012-09-04 ENCOUNTER — Other Ambulatory Visit (HOSPITAL_COMMUNITY): Payer: Self-pay | Admitting: *Deleted

## 2012-09-05 ENCOUNTER — Encounter (HOSPITAL_COMMUNITY): Payer: Medicare Other

## 2012-09-14 ENCOUNTER — Encounter (HOSPITAL_COMMUNITY)
Admission: RE | Admit: 2012-09-14 | Discharge: 2012-09-14 | Disposition: A | Discharge status: Home / Self Care | Payer: Medicare Other | Source: Ambulatory Visit | Attending: Nephrology | Admitting: Nephrology

## 2012-09-14 DIAGNOSIS — N184 Chronic kidney disease, stage 4 (severe): Secondary | ICD-10-CM | POA: Insufficient documentation

## 2012-09-14 DIAGNOSIS — D638 Anemia in other chronic diseases classified elsewhere: Secondary | ICD-10-CM | POA: Insufficient documentation

## 2012-09-14 MED ORDER — EPOETIN ALFA 20000 UNIT/ML IJ SOLN
INTRAMUSCULAR | Status: AC
  Filled 2012-09-14: qty 1

## 2012-09-14 MED ORDER — EPOETIN ALFA 20000 UNIT/ML IJ SOLN
20000.0000 [IU] | INTRAMUSCULAR | Status: DC
Start: 2012-09-14 — End: 2012-09-15

## 2012-09-20 MED FILL — Epoetin Alfa Inj 20000 Unit/ML: INTRAMUSCULAR | Qty: 1 | Status: AC

## 2012-10-01 ENCOUNTER — Other Ambulatory Visit: Payer: Self-pay | Admitting: *Deleted

## 2012-10-01 DIAGNOSIS — Z01810 Encounter for preprocedural cardiovascular examination: Secondary | ICD-10-CM

## 2012-10-01 DIAGNOSIS — N184 Chronic kidney disease, stage 4 (severe): Secondary | ICD-10-CM

## 2012-10-04 ENCOUNTER — Other Ambulatory Visit (HOSPITAL_COMMUNITY): Payer: Self-pay

## 2012-10-05 ENCOUNTER — Encounter (HOSPITAL_COMMUNITY): Payer: Medicare Other

## 2012-10-12 ENCOUNTER — Encounter (HOSPITAL_COMMUNITY)
Admission: RE | Admit: 2012-10-12 | Discharge: 2012-10-12 | Disposition: A | Discharge status: Home / Self Care | Payer: Medicare Other | Source: Ambulatory Visit | Attending: Nephrology | Admitting: Nephrology

## 2012-10-12 DIAGNOSIS — D638 Anemia in other chronic diseases classified elsewhere: Secondary | ICD-10-CM | POA: Insufficient documentation

## 2012-10-12 DIAGNOSIS — N184 Chronic kidney disease, stage 4 (severe): Secondary | ICD-10-CM | POA: Insufficient documentation

## 2012-10-12 LAB — FERRITIN: Ferritin: 577 ng/mL — ABNORMAL HIGH (ref 10–291)

## 2012-10-12 LAB — POCT HEMOGLOBIN-HEMACUE: Hemoglobin: 10 g/dL — ABNORMAL LOW (ref 12.0–15.0)

## 2012-10-12 LAB — RENAL FUNCTION PANEL
Albumin: 3.3 g/dL — ABNORMAL LOW (ref 3.5–5.2)
Chloride: 101 mEq/L (ref 96–112)
GFR calc Af Amer: 17 mL/min — ABNORMAL LOW (ref >90)
GFR calc non Af Amer: 14 mL/min — ABNORMAL LOW (ref >90)
Phosphorus: 4 mg/dL (ref 2.3–4.6)
Potassium: 4.5 mEq/L (ref 3.5–5.1)
Sodium: 143 mEq/L (ref 135–145)

## 2012-10-12 LAB — IRON AND TIBC
Saturation Ratios: 32 % (ref 20–55)
UIBC: 158 ug/dL (ref 125–400)

## 2012-10-12 MED ORDER — EPOETIN ALFA 20000 UNIT/ML IJ SOLN: 20000.0000 [IU] | INTRAMUSCULAR | Status: DC

## 2012-10-12 MED ORDER — EPOETIN ALFA 20000 UNIT/ML IJ SOLN
INTRAMUSCULAR | Status: AC
  Administered 2012-10-12: 20000 [IU] via SUBCUTANEOUS
  Filled 2012-10-12: qty 1

## 2012-10-23 ENCOUNTER — Encounter: Payer: Self-pay | Admitting: Vascular Surgery

## 2012-10-24 ENCOUNTER — Encounter (HOSPITAL_COMMUNITY): Payer: Self-pay | Admitting: Pharmacy Technician

## 2012-10-24 ENCOUNTER — Encounter (INDEPENDENT_AMBULATORY_CARE_PROVIDER_SITE_OTHER): Payer: Medicare Other | Admitting: *Deleted

## 2012-10-24 ENCOUNTER — Ambulatory Visit (INDEPENDENT_AMBULATORY_CARE_PROVIDER_SITE_OTHER): Payer: Medicare Other | Admitting: Vascular Surgery

## 2012-10-24 ENCOUNTER — Encounter: Payer: Self-pay | Admitting: Vascular Surgery

## 2012-10-24 VITALS — BP 143/73 | HR 90 | Ht 64.0 in | Wt 82.0 lb

## 2012-10-24 DIAGNOSIS — Z01810 Encounter for preprocedural cardiovascular examination: Secondary | ICD-10-CM

## 2012-10-24 DIAGNOSIS — N184 Chronic kidney disease, stage 4 (severe): Secondary | ICD-10-CM

## 2012-10-24 DIAGNOSIS — N186 End stage renal disease: Secondary | ICD-10-CM | POA: Insufficient documentation

## 2012-10-24 NOTE — Progress Notes (Signed)
[facility]ist [facility]  Patient name: Elizabeth [name] MRN: [id number] DOB: 1945/05/21 Sex: female  REASON FOR CONSULT: evaluate for hemodialysis access. Referred by Dr. Briant Cedar  HPI: Elizabeth [name] is a 67 y.o. female who is not yet on dialysis. She was sent for evaluation for hemodialysis access. Of note she is right-handed. She has had some anorexia and fatigue. She denies any significant shortness of breath or palpitations.  I have reviewed her records from [facility]. 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 In addition she has secondary hyperparathyroidism, hypertension, and anemia.  Past Medical History  Diagnosis Date  . Hyperlipidemia   . Shortness of breath     WITH EXERTION  . Diabetes mellitus without complication     TAKES INSULIN   . Urinary retention     DX NEUROGENIC BLADDER--HAS INDWELLING FOLEY CATHETER  . Arthritis   . Anemia   . Chronic kidney disease     PT TOLD RECENT TESTING SHOWS KIDNEY DAMAGE--IS SEEING NEPHROLOGIST.  OFFICE NOTES OBTAINED DR. MATTINGLY -"CHRONIC KIDNEY DISEASE 4 MOST LIKELY SECONDARY TO DIABETES"  . Stroke   . CAD (coronary artery disease)    Family History  Problem Relation Age of Onset  . Hyperlipidemia Mother   . Hypertension Mother    SOCIAL HISTORY: History  Substance Use Topics  . Smoking status: Never Smoker   . Smokeless tobacco: Never Used  . Alcohol Use: 0.6 oz/week    1 Cans of beer per week     Comment: 1 OR 2 BEER A WEEK   No Known Allergies  Current Outpatient Prescriptions  Medication Sig Dispense Refill  . carvedilol (COREG) 6.25 MG tablet Take 6.25 mg by mouth 2 (two) times daily with a meal.      . furosemide (LASIX) 40 MG tablet Take 40 mg by mouth every morning. 40 mg in AM, 20 mg at night- pt states      . insulin aspart (NOVOLOG) 100 UNIT/ML injection Inject 1-6 Units into the skin 3 (three) times daily before meals. 150-200, give 1 unit, 201-250,  give 2 units 251-300, give 4 units and greater than 300 give 6 units plus meal coverage/ po (carb) intake would be covered with Novolog based on her servings of carb intake, which based on her weight is approx 1 unit for every 1 serving (15 grams) carbohydrate      . insulin glargine (LANTUS) 100 UNIT/ML injection Inject 15 Units into the skin every morning.      . pravastatin (PRAVACHOL) 20 MG tablet Take 20 mg by mouth at bedtime.       . traMADol-acetaminophen (ULTRACET) 37.5-325 MG per tablet Take 1 tablet by mouth every 6 (six) hours as needed for pain.  30 tablet  0  . cephALEXin (KEFLEX) 500 MG capsule Take 500 mg by mouth 3 (three) times daily.      . ondansetron (ZOFRAN) 4 MG tablet Take 4 mg by mouth every 6 (six) hours as needed for nausea.      . sennosides-docusate sodium (SENOKOT-S) 8.6-50 MG tablet Take 1 tablet by mouth 2 (two) times daily. While taking pain meds to prevent constipation.  30 tablet  0   No current facility-administered medications for this visit.   REVIEW OF SYSTEMS: Arly.Keller ] denotes positive finding; [  ] denotes negative finding  CARDIOVASCULAR:  [ ]  chest pain   [ ]  chest pressure   [ ]  palpitations   Arly.Keller ] orthopnea   [ ]   dyspnea on exertion   [ ]  claudication   [ ]  rest pain   [ ]  DVT   [ ]  phlebitis PULMONARY:   [ ]  productive cough   [ ]  asthma   [ ]  wheezing NEUROLOGIC:   [ ]  weakness  Arly.Keller ] paresthesias  [ ]  aphasia  [ ]  amaurosis  [ ]  dizziness HEMATOLOGIC:   [ ]  bleeding problems   [ ]  clotting disorders MUSCULOSKELETAL:  [ ]  joint pain   [ ]  joint swelling [ ]  leg swelling GASTROINTESTINAL: [ ]   blood in stool  [ ]   hematemesis GENITOURINARY:  [ ]   dysuria  [ ]   hematuria PSYCHIATRIC:  [ ]  history of major depression INTEGUMENTARY:  [ ]  rashes  [ ]  ulcers CONSTITUTIONAL:  [ ]  fever   [ ]  chills  PHYSICAL EXAM: Filed Vitals:   10/24/12 1313  BP: 143/73  Pulse: 90  Height: 5\' 4"  (1.626 m)  Weight: 82 lb (37.195 kg)  SpO2: 100%   Body mass index is  14.07 kg/(m^2). GENERAL: The patient is a well-nourished female, in no acute distress. The vital signs are documented above. CARDIOVASCULAR: There is a regular rate and rhythm. I do not detect carotid bruits. She has palpable brachial and radial pulses. PULMONARY: There is good air exchange bilaterally without wheezing or rales. ABDOMEN: Soft and non-tender with normal pitched bowel sounds.  MUSCULOSKELETAL: There are no major deformities or cyanosis. NEUROLOGIC: No focal weakness or paresthesias are detected. SKIN: There are no ulcers or rashes noted. I do not see any adequate surface veins in her upper extremities. PSYCHIATRIC: The patient has a normal affect.  DATA:  I have independently interpreted her vein mapping. Her right forearm and upper arm cephalic vein is very small and not adequate for a fistula. Likewise it looks like her basilic vein on the right is too small for a fistula. The cephalic vein on the left is even smaller and currently not adequate. Her only potential option for a fistula appears to be her left basilic vein with diameters ranging from 0.21 to 0.25 cm.  MEDICAL ISSUES: I have recommended that we explore her left basilic vein in that this is reasonable perform a first stage basilic vein transposition. Given that she is not yet on dialysis if the vein is not acceptable then I would be reluctant to place an AV graft until she was closer to needing dialysis I recommend doing a basilic vein transposition in 2 stages given the marginal size of the vein. I have discussed the procedure potential complications with the patient she is agreeable to proceed. Her surgery is scheduled for 10/30/2012.  Yamina Lenis S [facility] [place] Beeper: [phone]

## 2012-10-25 ENCOUNTER — Encounter: Payer: Self-pay | Admitting: Nephrology

## 2012-10-26 ENCOUNTER — Other Ambulatory Visit: Payer: Self-pay | Admitting: *Deleted

## 2012-10-29 ENCOUNTER — Encounter (HOSPITAL_COMMUNITY): Payer: Self-pay | Admitting: *Deleted

## 2012-10-29 MED ORDER — DEXTROSE 5 % IV SOLN
1.5000 g | INTRAVENOUS | Status: AC
  Administered 2012-10-30: 1.5 g via INTRAVENOUS
  Filled 2012-10-29: qty 1.5

## 2012-10-30 ENCOUNTER — Encounter (HOSPITAL_COMMUNITY)
Admission: RE | Disposition: A | Discharge status: Home / Self Care | Payer: Self-pay | Source: Ambulatory Visit | Attending: Vascular Surgery

## 2012-10-30 ENCOUNTER — Ambulatory Visit (HOSPITAL_COMMUNITY)
Admission: RE | Admit: 2012-10-30 | Discharge: 2012-10-30 | Disposition: A | Discharge status: Home / Self Care | Payer: Medicare Other | Source: Ambulatory Visit | Attending: Vascular Surgery | Admitting: Vascular Surgery

## 2012-10-30 ENCOUNTER — Encounter (HOSPITAL_COMMUNITY): Payer: Self-pay | Admitting: *Deleted

## 2012-10-30 ENCOUNTER — Telehealth: Payer: Self-pay | Admitting: Vascular Surgery

## 2012-10-30 ENCOUNTER — Encounter (HOSPITAL_COMMUNITY): Payer: Self-pay | Admitting: Anesthesiology

## 2012-10-30 ENCOUNTER — Ambulatory Visit (HOSPITAL_COMMUNITY): Payer: Medicare Other | Admitting: Anesthesiology

## 2012-10-30 ENCOUNTER — Other Ambulatory Visit: Payer: Self-pay | Admitting: *Deleted

## 2012-10-30 DIAGNOSIS — E1129 Type 2 diabetes mellitus with other diabetic kidney complication: Secondary | ICD-10-CM | POA: Insufficient documentation

## 2012-10-30 DIAGNOSIS — N186 End stage renal disease: Secondary | ICD-10-CM

## 2012-10-30 DIAGNOSIS — N184 Chronic kidney disease, stage 4 (severe): Secondary | ICD-10-CM

## 2012-10-30 DIAGNOSIS — Z8673 Personal history of transient ischemic attack (TIA), and cerebral infarction without residual deficits: Secondary | ICD-10-CM | POA: Insufficient documentation

## 2012-10-30 DIAGNOSIS — Z4931 Encounter for adequacy testing for hemodialysis: Secondary | ICD-10-CM

## 2012-10-30 DIAGNOSIS — R630 Anorexia: Secondary | ICD-10-CM | POA: Insufficient documentation

## 2012-10-30 DIAGNOSIS — Z79899 Other long term (current) drug therapy: Secondary | ICD-10-CM | POA: Insufficient documentation

## 2012-10-30 DIAGNOSIS — E785 Hyperlipidemia, unspecified: Secondary | ICD-10-CM | POA: Insufficient documentation

## 2012-10-30 DIAGNOSIS — I2510 Atherosclerotic heart disease of native coronary artery without angina pectoris: Secondary | ICD-10-CM | POA: Insufficient documentation

## 2012-10-30 DIAGNOSIS — I120 Hypertensive chronic kidney disease with stage 5 chronic kidney disease or end stage renal disease: Secondary | ICD-10-CM | POA: Insufficient documentation

## 2012-10-30 DIAGNOSIS — M129 Arthropathy, unspecified: Secondary | ICD-10-CM | POA: Insufficient documentation

## 2012-10-30 DIAGNOSIS — N2581 Secondary hyperparathyroidism of renal origin: Secondary | ICD-10-CM | POA: Insufficient documentation

## 2012-10-30 DIAGNOSIS — Z681 Body mass index (BMI) 19 or less, adult: Secondary | ICD-10-CM | POA: Insufficient documentation

## 2012-10-30 DIAGNOSIS — Z794 Long term (current) use of insulin: Secondary | ICD-10-CM | POA: Insufficient documentation

## 2012-10-30 DIAGNOSIS — D649 Anemia, unspecified: Secondary | ICD-10-CM | POA: Insufficient documentation

## 2012-10-30 DIAGNOSIS — N319 Neuromuscular dysfunction of bladder, unspecified: Secondary | ICD-10-CM | POA: Insufficient documentation

## 2012-10-30 HISTORY — DX: Presence of urogenital implants: Z96.0

## 2012-10-30 HISTORY — DX: Presence of other specified devices: Z97.8

## 2012-10-30 HISTORY — PX: BASCILIC VEIN TRANSPOSITION: SHX5742

## 2012-10-30 LAB — POCT I-STAT 4, (NA,K, GLUC, HGB,HCT)
HCT: 39 % (ref 36.0–46.0)
Hemoglobin: 13.3 g/dL (ref 12.0–15.0)
Potassium: 3.4 mEq/L — ABNORMAL LOW (ref 3.5–5.1)

## 2012-10-30 LAB — GLUCOSE, CAPILLARY: Glucose-Capillary: 177 mg/dL — ABNORMAL HIGH (ref 70–99)

## 2012-10-30 SURGERY — TRANSPOSITION, VEIN, BASILIC
Anesthesia: Monitor Anesthesia Care | Site: Arm Upper | Laterality: Left | Wound class: Clean

## 2012-10-30 MED ORDER — CARVEDILOL 6.25 MG PO TABS
6.2500 mg | ORAL_TABLET | Freq: Two times a day (BID) | ORAL | Status: AC
  Administered 2012-10-30: 6.25 mg via ORAL
  Filled 2012-10-30: qty 1

## 2012-10-30 MED ORDER — LIDOCAINE HCL (PF) 1 % IJ SOLN
INTRAMUSCULAR | Status: AC
  Filled 2012-10-30: qty 30

## 2012-10-30 MED ORDER — LIDOCAINE HCL (PF) 1 % IJ SOLN
INTRAMUSCULAR | Status: DC | PRN
  Administered 2012-10-30: 30 mL

## 2012-10-30 MED ORDER — SODIUM CHLORIDE 0.9 % IV SOLN
INTRAVENOUS | Status: DC
  Administered 2012-10-30: 09:00:00 via INTRAVENOUS

## 2012-10-30 MED ORDER — HEPARIN SODIUM (PORCINE) 1000 UNIT/ML IJ SOLN
INTRAMUSCULAR | Status: DC | PRN
  Administered 2012-10-30: 4000 [IU] via INTRAVENOUS

## 2012-10-30 MED ORDER — FENTANYL CITRATE 0.05 MG/ML IJ SOLN
INTRAMUSCULAR | Status: DC | PRN
  Administered 2012-10-30: 50 ug via INTRAVENOUS

## 2012-10-30 MED ORDER — TRAMADOL-ACETAMINOPHEN 37.5-325 MG PO TABS: 1.0000 | ORAL_TABLET | Freq: Four times a day (QID) | ORAL | Status: DC | PRN

## 2012-10-30 MED ORDER — 0.9 % SODIUM CHLORIDE (POUR BTL) OPTIME
TOPICAL | Status: DC | PRN
  Administered 2012-10-30: 1000 mL

## 2012-10-30 MED ORDER — SODIUM CHLORIDE 0.9 % IV SOLN
INTRAVENOUS | Status: DC | PRN
  Administered 2012-10-30: 10:00:00 via INTRAVENOUS

## 2012-10-30 MED ORDER — LIDOCAINE-EPINEPHRINE (PF) 1 %-1:200000 IJ SOLN
INTRAMUSCULAR | Status: AC
  Filled 2012-10-30: qty 10

## 2012-10-30 MED ORDER — SODIUM CHLORIDE 0.9 % IR SOLN
Status: DC | PRN
  Administered 2012-10-30: 10:00:00

## 2012-10-30 MED ORDER — PROPOFOL INFUSION 10 MG/ML OPTIME
INTRAVENOUS | Status: DC | PRN
  Administered 2012-10-30: 25 ug/kg/min via INTRAVENOUS

## 2012-10-30 MED ORDER — PROTAMINE SULFATE 10 MG/ML IV SOLN
INTRAVENOUS | Status: DC | PRN
  Administered 2012-10-30: 30 mg via INTRAVENOUS

## 2012-10-30 SURGICAL SUPPLY — 35 items
CANISTER SUCTION 2500CC (MISCELLANEOUS) ×2 IMPLANT
CLIP TI MEDIUM 24 (CLIP) ×2 IMPLANT
CLIP TI WIDE RED SMALL 24 (CLIP) ×2 IMPLANT
CLOTH BEACON ORANGE TIMEOUT ST (SAFETY) ×2 IMPLANT
COVER PROBE W GEL 5X96 (DRAPES) IMPLANT
COVER SURGICAL LIGHT HANDLE (MISCELLANEOUS) ×2 IMPLANT
DECANTER SPIKE VIAL GLASS SM (MISCELLANEOUS) ×2 IMPLANT
DERMABOND ADVANCED (GAUZE/BANDAGES/DRESSINGS) ×1
DERMABOND ADVANCED .7 DNX12 (GAUZE/BANDAGES/DRESSINGS) ×1 IMPLANT
DRAIN PENROSE 1/2X12 LTX STRL (WOUND CARE) IMPLANT
ELECT REM PT RETURN 9FT ADLT (ELECTROSURGICAL) ×2
ELECTRODE REM PT RTRN 9FT ADLT (ELECTROSURGICAL) ×1 IMPLANT
GLOVE BIO SURGEON STRL SZ 6.5 (GLOVE) ×4 IMPLANT
GLOVE BIO SURGEON STRL SZ7.5 (GLOVE) ×2 IMPLANT
GLOVE BIOGEL PI IND STRL 7.5 (GLOVE) ×1 IMPLANT
GLOVE BIOGEL PI IND STRL 8 (GLOVE) ×1 IMPLANT
GLOVE BIOGEL PI INDICATOR 7.5 (GLOVE) ×1
GLOVE BIOGEL PI INDICATOR 8 (GLOVE) ×1
GLOVE ECLIPSE 7.5 STRL STRAW (GLOVE) ×2 IMPLANT
GOWN STRL NON-REIN LRG LVL3 (GOWN DISPOSABLE) ×6 IMPLANT
KIT BASIN OR (CUSTOM PROCEDURE TRAY) ×2 IMPLANT
KIT ROOM TURNOVER OR (KITS) ×2 IMPLANT
NS IRRIG 1000ML POUR BTL (IV SOLUTION) ×2 IMPLANT
PACK CV ACCESS (CUSTOM PROCEDURE TRAY) ×2 IMPLANT
PAD ARMBOARD 7.5X6 YLW CONV (MISCELLANEOUS) ×4 IMPLANT
SPONGE SURGIFOAM ABS GEL 100 (HEMOSTASIS) IMPLANT
SUT PROLENE 6 0 BV (SUTURE) ×2 IMPLANT
SUT SILK 2 0 SH (SUTURE) IMPLANT
SUT VIC AB 3-0 SH 27 (SUTURE) ×1
SUT VIC AB 3-0 SH 27X BRD (SUTURE) ×1 IMPLANT
SUT VICRYL 4-0 PS2 18IN ABS (SUTURE) ×2 IMPLANT
TOWEL OR 17X24 6PK STRL BLUE (TOWEL DISPOSABLE) ×2 IMPLANT
TOWEL OR 17X26 10 PK STRL BLUE (TOWEL DISPOSABLE) ×2 IMPLANT
UNDERPAD 30X30 INCONTINENT (UNDERPADS AND DIAPERS) ×2 IMPLANT
WATER STERILE IRR 1000ML POUR (IV SOLUTION) ×2 IMPLANT

## 2012-10-30 NOTE — H&P (View-Only) (Signed)
[facility] Specialist of [place]  Patient name: Elizabeth [name] MRN: [id number] DOB: 05/16/1945 Sex: female  REASON FOR CONSULT: evaluate for hemodialysis access. Referred by Dr. Mattingly  HPI: Elizabeth [name] is a 67 y.o. female who is not yet on dialysis. She was sent for evaluation for hemodialysis access. Of note she is right-handed. She has had some anorexia and fatigue. She denies any significant shortness of breath or palpitations.  I have reviewed her records from [facility]. 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 In addition she has secondary hyperparathyroidism, hypertension, and anemia.  Past Medical History  Diagnosis Date  . Hyperlipidemia   . Shortness of breath     WITH EXERTION  . Diabetes mellitus without complication     TAKES INSULIN   . Urinary retention     DX NEUROGENIC BLADDER--HAS INDWELLING FOLEY CATHETER  . Arthritis   . Anemia   . Chronic kidney disease     PT TOLD RECENT TESTING SHOWS KIDNEY DAMAGE--IS SEEING NEPHROLOGIST.  OFFICE NOTES OBTAINED DR. MATTINGLY -"CHRONIC KIDNEY DISEASE 4 MOST LIKELY SECONDARY TO DIABETES"  . Stroke   . CAD (coronary artery disease)    Family History  Problem Relation Age of Onset  . Hyperlipidemia Mother   . Hypertension Mother    SOCIAL HISTORY: History  Substance Use Topics  . Smoking status: Never Smoker   . Smokeless tobacco: Never Used  . Alcohol Use: 0.6 oz/week    1 Cans of beer per week     Comment: 1 OR 2 BEER A WEEK   No Known Allergies  Current Outpatient Prescriptions  Medication Sig Dispense Refill  . carvedilol (COREG) 6.25 MG tablet Take 6.25 mg by mouth 2 (two) times daily with a meal.      . furosemide (LASIX) 40 MG tablet Take 40 mg by mouth every morning. 40 mg in AM, 20 mg at night- pt states      . insulin aspart (NOVOLOG) 100 UNIT/ML injection Inject 1-6 Units into the skin 3 (three) times daily before meals. 150-200, give 1 unit, 201-250,  give 2 units 251-300, give 4 units and greater than 300 give 6 units plus meal coverage/ po (carb) intake would be covered with Novolog based on her servings of carb intake, which based on her weight is approx 1 unit for every 1 serving (15 grams) carbohydrate      . insulin glargine (LANTUS) 100 UNIT/ML injection Inject 15 Units into the skin every morning.      . pravastatin (PRAVACHOL) 20 MG tablet Take 20 mg by mouth at bedtime.       . traMADol-acetaminophen (ULTRACET) 37.5-325 MG per tablet Take 1 tablet by mouth every 6 (six) hours as needed for pain.  30 tablet  0  . cephALEXin (KEFLEX) 500 MG capsule Take 500 mg by mouth 3 (three) times daily.      . ondansetron (ZOFRAN) 4 MG tablet Take 4 mg by mouth every 6 (six) hours as needed for nausea.      . sennosides-docusate sodium (SENOKOT-S) 8.6-50 MG tablet Take 1 tablet by mouth 2 (two) times daily. While taking pain meds to prevent constipation.  30 tablet  0   No current facility-administered medications for this visit.   REVIEW OF SYSTEMS: [X ] denotes positive finding; [  ] denotes negative finding  CARDIOVASCULAR:  [ ] chest pain   [ ] chest pressure   [ ] palpitations   [X ] orthopnea   [ ]   dyspnea on exertion   [ ] claudication   [ ] rest pain   [ ] DVT   [ ] phlebitis PULMONARY:   [ ] productive cough   [ ] asthma   [ ] wheezing NEUROLOGIC:   [ ] weakness  [X ] paresthesias  [ ] aphasia  [ ] amaurosis  [ ] dizziness HEMATOLOGIC:   [ ] bleeding problems   [ ] clotting disorders MUSCULOSKELETAL:  [ ] joint pain   [ ] joint swelling [ ] leg swelling GASTROINTESTINAL: [ ]  blood in stool  [ ]  hematemesis GENITOURINARY:  [ ]  dysuria  [ ]  hematuria PSYCHIATRIC:  [ ] history of major depression INTEGUMENTARY:  [ ] rashes  [ ] ulcers CONSTITUTIONAL:  [ ] fever   [ ] chills  PHYSICAL EXAM: Filed Vitals:   10/24/12 1313  BP: 143/73  Pulse: 90  Height: 5' 4" (1.626 m)  Weight: 82 lb (37.195 kg)  SpO2: 100%   Body mass index is  14.07 kg/(m^2). GENERAL: The patient is a well-nourished female, in no acute distress. The vital signs are documented above. CARDIOVASCULAR: There is a regular rate and rhythm. I do not detect carotid bruits. She has palpable brachial and radial pulses. PULMONARY: There is good air exchange bilaterally without wheezing or rales. ABDOMEN: Soft and non-tender with normal pitched bowel sounds.  MUSCULOSKELETAL: There are no major deformities or cyanosis. NEUROLOGIC: No focal weakness or paresthesias are detected. SKIN: There are no ulcers or rashes noted. I do not see any adequate surface veins in her upper extremities. PSYCHIATRIC: The patient has a normal affect.  DATA:  I have independently interpreted her vein mapping. Her right forearm and upper arm cephalic vein is very small and not adequate for a fistula. Likewise it looks like her basilic vein on the right is too small for a fistula. The cephalic vein on the left is even smaller and currently not adequate. Her only potential option for a fistula appears to be her left basilic vein with diameters ranging from 0.21 to 0.25 cm.  MEDICAL ISSUES: I have recommended that we explore her left basilic vein in that this is reasonable perform a first stage basilic vein transposition. Given that she is not yet on dialysis if the vein is not acceptable then I would be reluctant to place an AV graft until she was closer to needing dialysis I recommend doing a basilic vein transposition in 2 stages given the marginal size of the vein. I have discussed the procedure potential complications with the patient she is agreeable to proceed. Her surgery is scheduled for 10/30/2012.  Sameena Artus S [facility] [place] Beeper: [phone]    

## 2012-10-30 NOTE — Anesthesia Procedure Notes (Signed)
Procedure Name: MAC Date/Time: 10/30/2012 9:42 AM Performed by: Fransisca Kaufmann Pre-anesthesia Checklist: Patient identified, Emergency Drugs available, Suction available and Patient being monitored Patient Re-evaluated:Patient Re-evaluated prior to inductionOxygen Delivery Method: Simple face mask Intubation Type: IV induction

## 2012-10-30 NOTE — Op Note (Signed)
NAME: Elizabeth [name]   MRN: [id number] DOB: [birth year]    DATE OF OPERATION: 10/30/2012  PREOP DIAGNOSIS: stage IV chronic kidney disease  POSTOP DIAGNOSIS: same  PROCEDURE: first stage basilic vein transposition  SURGEON: Di Kindle. Edilia Bo, MD, FACS  ASSIST: Doreatha Massed, PA  ANESTHESIA: local with sedation   EBL: minimal  INDICATIONS: Elizabeth [name] is a 67 y.o. female who is not yet on dialysis. She was noted to have a very small basilic vein as her only role option for an AV fistula. Therefore elected to do her basilic vein transposition in 2 stages. She's not yet on dialysis I did not want to place an AV graft.  FINDINGS: Although small, the left upper arm basilic vein did appear reasonable and was approximately 3-1/2 mm in diameter. The brachial artery was dissected free beneath the fascia. The vein was mobilized and then ligated distally. The patient was heparinized. The brachial artery was clamped proximally and distally and a longitudinal arteriotomy was made. The vein was mobilized over for anastomosis to the artery. The vein was sewn end to side to the artery using continuous 6-0 Prolene suture. At completion there was a good thrill in the vein. Hemostasis was obtained in the wound. The heparin was partially reversed with protamine. The wounds closed the deep layer 3-0 Vicryl and the skin closed with 4-0 Vicryl. Dermabond was applied. The patient tolerated the procedure well and was transferred to the recovery room in stable condition. All needle and sponge counts were correct.  TECHNIQUE: The patient was taken to the operating room and sedated by anesthesia. The left upper extremity was prepped and draped in the usual sterile fashion.  Elizabeth Ferrari, MD, FACS [facility]  DATE OF DICTATION:   10/30/2012

## 2012-10-30 NOTE — Anesthesia Postprocedure Evaluation (Signed)
  Anesthesia Post-op Note  Patient: Elizabeth [name], Elizabeth [name]  Procedure(s) Performed: Procedure(s): LEFT 1ST STAGE BASCILIC VEIN TRANSPOSITION (Left)  Patient Location: PACU  Anesthesia Type:MAC  Level of Consciousness: awake, alert  and oriented  Airway and Oxygen Therapy: Patient Spontanous Breathing and Patient connected to nasal cannula oxygen  Post-op Pain: none  Post-op Assessment: Post-op Vital signs reviewed, Patient's Cardiovascular Status Stable, Respiratory Function Stable, Patent Airway and Pain level controlled  Post-op Vital Signs: stable  Complications: No apparent anesthesia complications

## 2012-10-30 NOTE — Interval H&P Note (Signed)
History and Physical Interval Note:  10/30/2012 9:20 AM  Elizabeth [name]  has presented today for surgery, with the diagnosis of End Stage Renal Disease  The various methods of treatment have been discussed with the patient and family. After consideration of risks, benefits and other options for treatment, the patient has consented to  Procedure(s): LEFT 1ST STAGE BASCILIC VEIN TRANSPOSITION (Left) as a surgical intervention .  The patient's history has been reviewed, patient examined, no change in status, stable for surgery.  I have reviewed the patient's chart and labs.  Questions were answered to the patient's satisfaction.     Charon Smedberg S

## 2012-10-30 NOTE — Telephone Encounter (Addendum)
Message copied by Rosalyn Charters on Tue Oct 30, 2012  1:17 PM ------      Message from: Melene Plan      Created: Tue Oct 30, 2012 11:21 AM      Regarding: FW: charge and f/u                   ----- Message -----         From: Chuck Hint, MD         Sent: 10/30/2012  11:10 AM           To: Reuel Derby, Melene Plan, RN, #      Subject: charge and f/u                                           PROCEDURE: first stage basilic vein transposition            SURGEON: Di Kindle. Edilia Bo, MD, FACS            ASSIST: Doreatha Massed, PA            She needs a follow up visit in 4 weeks with a duplex of her first stage basilic vein transposition. Thank you.      CD ------  notified patient of fu appt. with dr. Edilia Bo on 11-28-12 at 9:30 am

## 2012-10-30 NOTE — Transfer of Care (Signed)
Immediate Anesthesia Transfer of Care Note  Patient: Elizabeth [name]  Procedure(s) Performed: Procedure(s): LEFT 1ST STAGE BASCILIC VEIN TRANSPOSITION (Left)  Patient Location: PACU  Anesthesia Type:MAC  Level of Consciousness: awake, alert , oriented and sedated  Airway & Oxygen Therapy: Patient Spontanous Breathing  Post-op Assessment: Report given to PACU RN, Post -op Vital signs reviewed and stable and Patient moving all extremities  Post vital signs: Reviewed and stable  Complications: No apparent anesthesia complications

## 2012-10-30 NOTE — Anesthesia Preprocedure Evaluation (Signed)
Anesthesia Evaluation  Patient identified by MRN, date of birth, ID band Patient awake    Reviewed: Allergy & Precautions, H&P , NPO status , Patient's Chart, lab work & pertinent test results, reviewed documented beta blocker date and time   Airway Mallampati: I      Dental  (+) Teeth Intact   Pulmonary shortness of breath,  breath sounds clear to auscultation        Cardiovascular + CAD Rhythm:regular     Neuro/Psych CVA    GI/Hepatic   Endo/Other  diabetes, Type 1, Insulin Dependent  Renal/GU Renal disease     Musculoskeletal   Abdominal   Peds  Hematology  (+) anemia ,   Anesthesia Other Findings   Reproductive/Obstetrics                           Anesthesia Physical Anesthesia Plan  ASA: III  Anesthesia Plan: MAC   Post-op Pain Management:    Induction:   Airway Management Planned:   Additional Equipment:   Intra-op Plan:   Post-operative Plan:   Informed Consent:   Plan Discussed with: Anesthesiologist and CRNA  Anesthesia Plan Comments:         Anesthesia Quick Evaluation

## 2012-11-02 ENCOUNTER — Encounter (HOSPITAL_COMMUNITY): Payer: Medicare Other

## 2012-11-02 ENCOUNTER — Encounter (HOSPITAL_COMMUNITY): Payer: Self-pay | Admitting: Vascular Surgery

## 2012-11-06 ENCOUNTER — Encounter (HOSPITAL_COMMUNITY): Payer: Medicare Other

## 2012-11-09 ENCOUNTER — Encounter (HOSPITAL_COMMUNITY)
Admission: RE | Admit: 2012-11-09 | Discharge: 2012-11-09 | Disposition: A | Discharge status: Home / Self Care | Payer: Medicare Other | Source: Ambulatory Visit | Attending: Nephrology | Admitting: Nephrology

## 2012-11-09 DIAGNOSIS — D638 Anemia in other chronic diseases classified elsewhere: Secondary | ICD-10-CM | POA: Insufficient documentation

## 2012-11-09 DIAGNOSIS — N184 Chronic kidney disease, stage 4 (severe): Secondary | ICD-10-CM | POA: Insufficient documentation

## 2012-11-09 MED ORDER — EPOETIN ALFA 20000 UNIT/ML IJ SOLN
20000.0000 [IU] | INTRAMUSCULAR | Status: DC
  Administered 2012-11-09: 20000 [IU] via SUBCUTANEOUS
  Filled 2012-11-09: qty 1

## 2012-11-11 ENCOUNTER — Encounter: Payer: Self-pay | Admitting: *Deleted

## 2012-11-16 ENCOUNTER — Ambulatory Visit (INDEPENDENT_AMBULATORY_CARE_PROVIDER_SITE_OTHER): Payer: Medicare Other | Admitting: Cardiovascular Disease

## 2012-11-16 ENCOUNTER — Encounter: Payer: Self-pay | Admitting: Cardiovascular Disease

## 2012-11-16 ENCOUNTER — Ambulatory Visit: Payer: Medicare Other | Admitting: Cardiovascular Disease

## 2012-11-16 VITALS — BP 112/62 | HR 84 | Ht 64.0 in | Wt 84.7 lb

## 2012-11-16 DIAGNOSIS — T07XXXA Unspecified multiple injuries, initial encounter: Secondary | ICD-10-CM

## 2012-11-16 DIAGNOSIS — I998 Other disorder of circulatory system: Secondary | ICD-10-CM | POA: Insufficient documentation

## 2012-11-16 DIAGNOSIS — L98499 Non-pressure chronic ulcer of skin of other sites with unspecified severity: Secondary | ICD-10-CM

## 2012-11-16 NOTE — Patient Instructions (Addendum)
  We will see you back in follow up after the doppler  Dr Allyson Sabal has ordered a lower extremity arterial doppler

## 2012-11-16 NOTE — Assessment & Plan Note (Signed)
Patient was sent by [name] from [facility] podiatry for peripheral vasodilation because of a nonhealing left metatarsal head ulcer.the patient denies claudication. She was told that she had "poor circulation". She has no pedal pulses. On going to obtain lower extremity arterial Doppler studies.

## 2012-11-16 NOTE — Progress Notes (Signed)
11/16/2012 Elizabeth [name]   1945/12/14  [id number]  Primary Physician Elizabeth Lesch, MD Primary Cardiologist: Elizabeth Gess MD Elizabeth [name]   HPI:  Elizabeth [name]  a 67 year old thin and frail-appearing African American femalepatient of Elizabeth [name]  as well as Dr. [place] [name].she was last seen in our office back in June. This was for preoperative clearance the prior 2 bladder surgery. A Myoview stress test did show question of inferior ischemia but she was asymptomatic and cardiac catheterization was not recommended because of her renal insufficiency. She now has an AV fistula. Problems include insulin-dependent diabetes, hypertension and hyperlipidemia. She had a bunion on her left great toe which now has become a nonhealing ulcer.   Current Outpatient Prescriptions  Medication Sig Dispense Refill  . aspirin EC 81 MG tablet Take 81 mg by mouth daily.      . carvedilol (COREG) 6.25 MG tablet Take 6.25 mg by mouth 2 (two) times daily with a meal.      . cyproheptadine (PERIACTIN) 2 MG/5ML syrup Take 4 mg by mouth 1 day or 1 dose.      . furosemide (LASIX) 40 MG tablet Take 20-40 mg by mouth 2 (two) times daily. 1 tab in the am, 0.5 half tab at bedtime      . insulin aspart (NOVOLOG) 100 UNIT/ML injection Inject 1-6 Units into the skin 3 (three) times daily before meals. 150-200, give 1 unit, 201-250, give 2 units 251-300, give 4 units and greater than 300 give 6 units plus meal coverage/ po (carb) intake would be covered with Novolog based on her servings of carb intake, which based on her weight is approx 1 unit for every 1 serving (15 grams) carbohydrate      . insulin glargine (LANTUS) 100 UNIT/ML injection Inject 15 Units into the skin every morning.      . pravastatin (PRAVACHOL) 20 MG tablet Take 20 mg by mouth at bedtime.       . traMADol-acetaminophen (ULTRACET) 37.5-325 MG per tablet Take 1 tablet by mouth every 6 (six) hours as needed for pain.  20 tablet  0    No current facility-administered medications for this visit.    No Known Allergies  History   Social History  . Marital Status: Single    Spouse Name: N/A    Number of Children: N/A  . Years of Education: N/A   Occupational History  . Not on file.   Social History Main Topics  . Smoking status: Never Smoker   . Smokeless tobacco: Never Used  . Alcohol Use: No     Comment: 1 OR 2 BEER A WEEK  . Drug Use: No  . Sexual Activity: Not on file   Other Topics Concern  . Not on file   Social History Narrative  . No narrative on file     Review of Systems: General: negative for chills, fever, night sweats or weight changes.  Cardiovascular: negative for chest pain, dyspnea on exertion, edema, orthopnea, palpitations, paroxysmal nocturnal dyspnea or shortness of breath Dermatological: negative for rash Respiratory: negative for cough or wheezing Urologic: negative for hematuria Abdominal: negative for nausea, vomiting, diarrhea, bright red blood per rectum, melena, or hematemesis Neurologic: negative for visual changes, syncope, or dizziness All other systems reviewed and are otherwise negative except as noted above.    Blood pressure 112/62, pulse 84, height 5\' 4"  (1.626 m), weight 84 lb 11.2 oz (38.42 kg).  General appearance: alert and no distress Neck:  no adenopathy, no carotid bruit, no JVD, supple, symmetrical, trachea midline and thyroid not enlarged, symmetric, no tenderness/mass/nodules Lungs: clear to auscultation bilaterally Heart: regular rate and rhythm, S1, S2 normal, no murmur, click, rub or gallop Abdomen: soft, non-tender; bowel sounds normal; no masses,  no organomegaly Extremities: extremities normal, atraumatic, no cyanosis or edema Pulses: 2+ and symmetric diminished pedal pulses bilaterally  EKG not performed today  ASSESSMENT AND PLAN:   Critical lower limb ischemia Patient was sent by Elizabeth [name] from [facility] podiatry for  peripheral vasodilation because of a nonhealing left metatarsal head ulcer.the patient denies claudication. She was told that she had "poor circulation". She has no pedal pulses. On going to obtain lower extremity arterial Doppler studies.      Elizabeth Gess MD [place], [facility] 11/16/2012 11:48 AM Elizabeth Gess MD FACP,FACC,FAHA, [name]

## 2012-11-27 ENCOUNTER — Encounter: Payer: Self-pay | Admitting: Cardiovascular Disease

## 2012-11-27 ENCOUNTER — Encounter: Payer: Self-pay | Admitting: Vascular Surgery

## 2012-11-28 ENCOUNTER — Ambulatory Visit (HOSPITAL_COMMUNITY)
Admission: RE | Admit: 2012-11-28 | Discharge: 2012-11-28 | Disposition: A | Discharge status: Home / Self Care | Payer: Medicare Other | Source: Ambulatory Visit | Attending: Vascular Surgery | Admitting: Vascular Surgery

## 2012-11-28 ENCOUNTER — Encounter: Payer: Self-pay | Admitting: Vascular Surgery

## 2012-11-28 ENCOUNTER — Ambulatory Visit (INDEPENDENT_AMBULATORY_CARE_PROVIDER_SITE_OTHER): Payer: Self-pay | Admitting: Vascular Surgery

## 2012-11-28 VITALS — BP 111/62 | HR 94 | Ht 64.0 in | Wt 83.0 lb

## 2012-11-28 DIAGNOSIS — Z4931 Encounter for adequacy testing for hemodialysis: Secondary | ICD-10-CM

## 2012-11-28 DIAGNOSIS — N186 End stage renal disease: Secondary | ICD-10-CM

## 2012-11-28 DIAGNOSIS — Z48812 Encounter for surgical aftercare following surgery on the circulatory system: Secondary | ICD-10-CM

## 2012-11-28 NOTE — Progress Notes (Signed)
Patient name: Elizabeth [name] MRN: [id number] DOB: [birth year] Sex: female  REASON FOR VISIT: follow up of first stage left basilic vein transposition.  HPI: Elizabeth [name] is a 67 y.o. female who is not yet on dialysis. She had very small veins on her preoperative vein mapping. However, I elected to explore her basilic vein and we did a first stage basilic vein transposition. I was reluctant to place an AV graft he tissue is not yet on dialysis. She comes in for follow visit. She is still not on dialysis. He has no pain or paresthesias in her left arm.  REVIEW OF SYSTEMS: Arly.Keller ] denotes positive finding; [  ] denotes negative finding  CARDIOVASCULAR:  [ ]  chest pain   [ ]  dyspnea on exertion    CONSTITUTIONAL:  [ ]  fever   [ ]  chills  PHYSICAL EXAM: Filed Vitals:   11/28/12 1031  BP: 111/62  Pulse: 94  Height: 5\' 4"  (1.626 m)  Weight: 83 lb (37.649 kg)  SpO2: 100%   Body mass index is 14.24 kg/(m^2). GENERAL: The patient is a well-nourished female, in no acute distress. The vital signs are documented above. CARDIOVASCULAR: There is a regular rate and rhythm  PULMONARY: There is good air exchange bilaterally without wheezing or rales. The fistula has been palpable thrill and an audible bruit. The vein is fairly small. She does have some areas of increased callosity within the vein likely related to the small size of the vein.  MEDICAL ISSUES: At this point, I do not think her fistula is adequate size to proceed with her second stage transposition. If she were closer to needing dialysis the option would be to proceed with placement of an AV graft. I think the best option is to see her back in 2 months with a follow up duplex scan. The fistula is showing evidence of improvement then we can consider proceeding with rest position of her basilic vein. If not then we can consider placing an AV graft if she is nearing the need for dialysis.  DICKSON,CHRISTOPHER S [facility]  [place] Beeper: [phone]

## 2012-11-29 ENCOUNTER — Other Ambulatory Visit (HOSPITAL_COMMUNITY): Payer: Self-pay | Admitting: *Deleted

## 2012-11-30 ENCOUNTER — Ambulatory Visit (HOSPITAL_COMMUNITY)
Admission: RE | Admit: 2012-11-30 | Discharge: 2012-11-30 | Disposition: A | Discharge status: Home / Self Care | Payer: Medicare Other | Source: Ambulatory Visit | Attending: Internal Medicine | Admitting: Internal Medicine

## 2012-11-30 ENCOUNTER — Encounter (HOSPITAL_COMMUNITY)
Admission: RE | Admit: 2012-11-30 | Discharge: 2012-11-30 | Disposition: A | Discharge status: Home / Self Care | Payer: Medicare Other | Source: Ambulatory Visit | Attending: Nephrology | Admitting: Nephrology

## 2012-11-30 DIAGNOSIS — N184 Chronic kidney disease, stage 4 (severe): Secondary | ICD-10-CM | POA: Insufficient documentation

## 2012-11-30 DIAGNOSIS — T07XXXA Unspecified multiple injuries, initial encounter: Secondary | ICD-10-CM

## 2012-11-30 DIAGNOSIS — I70269 Atherosclerosis of native arteries of extremities with gangrene, unspecified extremity: Secondary | ICD-10-CM | POA: Insufficient documentation

## 2012-11-30 DIAGNOSIS — L97909 Non-pressure chronic ulcer of unspecified part of unspecified lower leg with unspecified severity: Secondary | ICD-10-CM | POA: Insufficient documentation

## 2012-11-30 DIAGNOSIS — D638 Anemia in other chronic diseases classified elsewhere: Secondary | ICD-10-CM | POA: Insufficient documentation

## 2012-11-30 DIAGNOSIS — L98499 Non-pressure chronic ulcer of skin of other sites with unspecified severity: Secondary | ICD-10-CM

## 2012-11-30 MED ORDER — EPOETIN ALFA 20000 UNIT/ML IJ SOLN: 20000.0000 [IU] | INTRAMUSCULAR | Status: DC

## 2012-11-30 NOTE — Progress Notes (Signed)
Lower Extremity Arterial Duplex Completed. °Brianna L Mazza,RVT °

## 2012-12-03 LAB — POCT HEMOGLOBIN-HEMACUE: Hemoglobin: 11.5 g/dL — ABNORMAL LOW (ref 12.0–15.0)

## 2012-12-11 ENCOUNTER — Encounter: Payer: Self-pay | Admitting: *Deleted

## 2012-12-14 ENCOUNTER — Encounter (HOSPITAL_COMMUNITY)
Admission: RE | Admit: 2012-12-14 | Discharge: 2012-12-14 | Disposition: A | Discharge status: Home / Self Care | Payer: Medicare Other | Source: Ambulatory Visit | Attending: Nephrology | Admitting: Nephrology

## 2012-12-14 MED ORDER — EPOETIN ALFA 20000 UNIT/ML IJ SOLN
INTRAMUSCULAR | Status: AC
  Administered 2012-12-14: 20000 [IU] via SUBCUTANEOUS
  Filled 2012-12-14: qty 1

## 2012-12-21 ENCOUNTER — Ambulatory Visit (INDEPENDENT_AMBULATORY_CARE_PROVIDER_SITE_OTHER): Payer: Medicare Other | Admitting: Cardiovascular Disease

## 2012-12-21 ENCOUNTER — Encounter: Payer: Self-pay | Admitting: Cardiovascular Disease

## 2012-12-21 VITALS — BP 120/60 | HR 108 | Ht 64.0 in | Wt 85.5 lb

## 2012-12-21 DIAGNOSIS — I999 Unspecified disorder of circulatory system: Secondary | ICD-10-CM

## 2012-12-21 DIAGNOSIS — I998 Other disorder of circulatory system: Secondary | ICD-10-CM

## 2012-12-21 NOTE — Patient Instructions (Addendum)
Dr Allyson Sabal will only see you as needed.  Follow up with Dr Rennis Golden in March 2015.

## 2012-12-21 NOTE — Assessment & Plan Note (Signed)
She had a poorly healing ulcer on her left great toe when I saw her last night and this was excised 14. We obtained lower extremity Arjo Doppler studies that showed normal ABIs with an occluded left posterior tibial but patent dorsalis pedis and peroneal suggesting adequate circulation for healing. She saw her podiatrist who is treating her conservatively with aggressive local care and has noticed gradual improvement.

## 2012-12-21 NOTE — Progress Notes (Signed)
12/21/2012 Elizabeth [name]   1945-12-13  [id number]  Primary Physician Elizabeth Lesch, MD Primary Cardiologist: Elizabeth Gess MD Elizabeth [name]   HPI:  Ms. Elizabeth [name] a 67 year old thin and frail-appearing African American femalepatient of Dr. Larey [name] as well as Dr. [place] [name].she was last seen in our office back in June. This was for preoperative clearance the prior 2 bladder surgery. A Myoview stress test did show question of inferior ischemia but she was asymptomatic and cardiac catheterization was not recommended because of her renal insufficiency. She now has an AV fistula. Problems include insulin-dependent diabetes, hypertension and hyperlipidemia. She had a bunion on her left great toe which now has become a nonhealing ulcer.I saw her a month ago. Her left great toe ulcer slowly healing. Her arterial Doppler studies showed an ABI of 1.1 bilaterally with occluded left posterior tibial suggesting adequate adequate perfusion for healing.    Current Outpatient Prescriptions  Medication Sig Dispense Refill  . aspirin EC 81 MG tablet Take 81 mg by mouth daily.      . carvedilol (COREG) 6.25 MG tablet Take 6.25 mg by mouth 2 (two) times daily with a meal.      . furosemide (LASIX) 40 MG tablet Take 20-40 mg by mouth 2 (two) times daily. 1 tab in the am, 0.5 half tab at bedtime      . insulin aspart (NOVOLOG) 100 UNIT/ML injection Inject 1-6 Units into the skin 3 (three) times daily before meals. 150-200, give 1 unit, 201-250, give 2 units 251-300, give 4 units and greater than 300 give 6 units plus meal coverage/ po (carb) intake would be covered with Novolog based on her servings of carb intake, which based on her weight is approx 1 unit for every 1 serving (15 grams) carbohydrate      . insulin glargine (LANTUS) 100 UNIT/ML injection Inject 15 Units into the skin at bedtime.       . pravastatin (PRAVACHOL) 20 MG tablet Take 20 mg by mouth at bedtime.       .  traMADol-acetaminophen (ULTRACET) 37.5-325 MG per tablet Take 1 tablet by mouth every 6 (six) hours as needed for pain.  20 tablet  0   No current facility-administered medications for this visit.    No Known Allergies  History   Social History  . Marital Status: Single    Spouse Name: N/A    Number of Children: N/A  . Years of Education: N/A   Occupational History  . Not on file.   Social History Main Topics  . Smoking status: Never Smoker   . Smokeless tobacco: Never Used  . Alcohol Use: No     Comment: 1 OR 2 BEER A WEEK  . Drug Use: No  . Sexual Activity: Not on file   Other Topics Concern  . Not on file   Social History Narrative  . No narrative on file     Review of Systems: General: negative for chills, fever, night sweats or weight changes.  Cardiovascular: negative for chest pain, dyspnea on exertion, edema, orthopnea, palpitations, paroxysmal nocturnal dyspnea or shortness of breath Dermatological: negative for rash Respiratory: negative for cough or wheezing Urologic: negative for hematuria Abdominal: negative for nausea, vomiting, diarrhea, bright [name] blood per rectum, melena, or hematemesis Neurologic: negative for visual changes, syncope, or dizziness All other systems reviewed and are otherwise negative except as noted above.    Blood pressure 120/60, pulse 108, height 5\' 4"  (1.626 m), weight  85 lb 8 oz (38.783 kg).  General appearance: alert and no distress Neck: no adenopathy, no carotid bruit, no JVD, supple, symmetrical, trachea midline and thyroid not enlarged, symmetric, no tenderness/mass/nodules Lungs: clear to auscultation bilaterally Heart: regular rate and rhythm, S1, S2 normal, no murmur, click, rub or gallop Extremities: extremities normal, atraumatic, no cyanosis or edema and slowly healing bunion left great toe  EKG not performed today  ASSESSMENT AND PLAN:   Critical lower limb ischemia She had a poorly healing ulcer on her left  great toe when I saw her last night and this was excised 14. We obtained lower extremity Arjo Doppler studies that showed normal ABIs with an occluded left posterior tibial but patent dorsalis pedis and peroneal suggesting adequate circulation for healing. She saw her podiatrist who is treating her conservatively with aggressive local care and has noticed gradual improvement.      Elizabeth Gess MD FACP,FACC,FAHA, [facility] 12/21/2012 12:37 PM

## 2012-12-27 ENCOUNTER — Other Ambulatory Visit: Payer: Self-pay

## 2013-01-04 ENCOUNTER — Encounter (HOSPITAL_COMMUNITY)
Admission: RE | Admit: 2013-01-04 | Discharge: 2013-01-04 | Disposition: A | Discharge status: Home / Self Care | Payer: Medicare Other | Source: Ambulatory Visit | Attending: Nephrology | Admitting: Nephrology

## 2013-01-04 DIAGNOSIS — N184 Chronic kidney disease, stage 4 (severe): Secondary | ICD-10-CM | POA: Insufficient documentation

## 2013-01-04 DIAGNOSIS — D638 Anemia in other chronic diseases classified elsewhere: Secondary | ICD-10-CM | POA: Insufficient documentation

## 2013-01-04 LAB — IRON AND TIBC
Iron: 17 ug/dL — ABNORMAL LOW (ref 42–135)
TIBC: 179 ug/dL — ABNORMAL LOW (ref 250–470)
UIBC: 162 ug/dL (ref 125–400)

## 2013-01-04 MED ORDER — EPOETIN ALFA 20000 UNIT/ML IJ SOLN
INTRAMUSCULAR | Status: AC
  Filled 2013-01-04: qty 1

## 2013-01-04 MED ORDER — EPOETIN ALFA 20000 UNIT/ML IJ SOLN
20000.0000 [IU] | INTRAMUSCULAR | Status: DC
  Administered 2013-01-04: 10:00:00 20000 [IU] via SUBCUTANEOUS

## 2013-01-24 ENCOUNTER — Other Ambulatory Visit (HOSPITAL_COMMUNITY): Payer: Self-pay | Admitting: *Deleted

## 2013-01-25 ENCOUNTER — Encounter (HOSPITAL_COMMUNITY)
Admission: RE | Admit: 2013-01-25 | Discharge: 2013-01-25 | Disposition: A | Discharge status: Home / Self Care | Payer: Medicare Other | Source: Ambulatory Visit | Attending: Nephrology | Admitting: Nephrology

## 2013-01-25 DIAGNOSIS — N184 Chronic kidney disease, stage 4 (severe): Secondary | ICD-10-CM | POA: Insufficient documentation

## 2013-01-25 DIAGNOSIS — D638 Anemia in other chronic diseases classified elsewhere: Secondary | ICD-10-CM | POA: Insufficient documentation

## 2013-01-25 MED ORDER — SODIUM CHLORIDE 0.9 % IV SOLN
1020.0000 mg | Freq: Once | INTRAVENOUS | Status: AC
  Administered 2013-01-25: 1020 mg via INTRAVENOUS
  Filled 2013-01-25: qty 34

## 2013-01-25 MED ORDER — SODIUM CHLORIDE 0.9 % IV SOLN
INTRAVENOUS | Status: DC
  Administered 2013-01-25: 12:00:00 via INTRAVENOUS

## 2013-01-25 MED ORDER — EPOETIN ALFA 20000 UNIT/ML IJ SOLN: 20000.0000 [IU] | INTRAMUSCULAR | Status: DC

## 2013-01-29 ENCOUNTER — Encounter: Payer: Self-pay | Admitting: Vascular Surgery

## 2013-01-30 ENCOUNTER — Ambulatory Visit (INDEPENDENT_AMBULATORY_CARE_PROVIDER_SITE_OTHER): Payer: Self-pay | Admitting: Vascular Surgery

## 2013-01-30 ENCOUNTER — Encounter: Payer: Self-pay | Admitting: *Deleted

## 2013-01-30 ENCOUNTER — Encounter: Payer: Self-pay | Admitting: Vascular Surgery

## 2013-01-30 ENCOUNTER — Ambulatory Visit (HOSPITAL_COMMUNITY)
Admission: RE | Admit: 2013-01-30 | Discharge: 2013-01-30 | Disposition: A | Discharge status: Home / Self Care | Payer: Medicare Other | Source: Ambulatory Visit | Attending: Vascular Surgery | Admitting: Vascular Surgery

## 2013-01-30 VITALS — BP 137/66 | HR 97 | Ht 64.0 in | Wt 86.0 lb

## 2013-01-30 DIAGNOSIS — Z48812 Encounter for surgical aftercare following surgery on the circulatory system: Secondary | ICD-10-CM

## 2013-01-30 DIAGNOSIS — Z4931 Encounter for adequacy testing for hemodialysis: Secondary | ICD-10-CM

## 2013-01-30 DIAGNOSIS — N186 End stage renal disease: Secondary | ICD-10-CM | POA: Insufficient documentation

## 2013-01-30 NOTE — Assessment & Plan Note (Signed)
At this point I think it would be reasonable to proceed with the second stage of her basilic vein transposition. This has been scheduled for 02/05/2013.

## 2013-01-30 NOTE — Progress Notes (Signed)
   Patient name: Elizabeth [name] MRN: [id number] DOB: 01/25/1946 Sex: female  REASON FOR VISIT: follow up of left AV fistula.  HPI: Elizabeth [name] is a 67 y.o. female we'll I last saw on 11/28/2012. She had a first stage left basilic vein transposition on 10/30/2012. She was seen in follow up on 11/28/2012. At that time, I did not think that the fistula was adequate in size to proceed with second stage transposition. She comes in today for a follow up duplex scan. She has no complaints referrable to the left upper extremity.   REVIEW OF SYSTEMS: [X ] denotes positive finding; [  ] denotes negative finding  CARDIOVASCULAR:  [ ] chest pain   [ ] dyspnea on exertion    CONSTITUTIONAL:  [ ] fever   [ ] chills  PHYSICAL EXAM: Filed Vitals:   01/30/13 1357  BP: 137/66  Pulse: 97  Height: 5' 4" (1.626 m)  Weight: 86 lb (39.009 kg)  SpO2: 100%   Body mass index is 14.75 kg/(m^2). GENERAL: The patient is a well-nourished female, in no acute distress. The vital signs are documented above. CARDIOVASCULAR: There is a regular rate and rhythm. PULMONARY: There is good air exchange bilaterally without wheezing or rales. Her fistula in the left upper arm has a much improved thrill.  DUPLEX: Duplex of her left AV fistula today shows that the fistula is patent. There are elevated velocities in the mid segment of the vein was partially nonocclusive thrombus versus a valve leaflet present in the mid segment of the vein. There also some branches noted. Diameters of the fistula range from 0.37 cm to 0.79 cm.  I have compared this study to her previous study from 11/28/2012. The diameters of the vein have improved significantly.  MEDICAL ISSUES:  End stage renal disease At this point I think it would be reasonable to proceed with the second stage of her basilic vein transposition. This has been scheduled for 02/05/2013.   Mayanna Garlitz S [facility] [place] Beeper:  [phone]     

## 2013-01-31 ENCOUNTER — Other Ambulatory Visit: Payer: Self-pay | Admitting: *Deleted

## 2013-01-31 ENCOUNTER — Encounter (HOSPITAL_COMMUNITY): Payer: Self-pay

## 2013-01-31 ENCOUNTER — Encounter: Payer: Self-pay | Admitting: *Deleted

## 2013-02-04 ENCOUNTER — Encounter (HOSPITAL_COMMUNITY): Payer: Self-pay | Admitting: *Deleted

## 2013-02-04 MED ORDER — DEXTROSE 5 % IV SOLN
1.5000 g | INTRAVENOUS | Status: AC
  Administered 2013-02-05: 1.5 g via INTRAVENOUS
  Filled 2013-02-04: qty 1.5

## 2013-02-05 ENCOUNTER — Encounter (HOSPITAL_COMMUNITY): Payer: Medicare Other | Admitting: Certified Registered"

## 2013-02-05 ENCOUNTER — Encounter (HOSPITAL_COMMUNITY): Payer: Self-pay | Admitting: *Deleted

## 2013-02-05 ENCOUNTER — Ambulatory Visit (HOSPITAL_COMMUNITY): Payer: Medicare Other | Admitting: Certified Registered"

## 2013-02-05 ENCOUNTER — Encounter (HOSPITAL_COMMUNITY)
Admission: RE | Disposition: A | Discharge status: Home / Self Care | Payer: Self-pay | Source: Ambulatory Visit | Attending: Vascular Surgery

## 2013-02-05 ENCOUNTER — Ambulatory Visit (HOSPITAL_COMMUNITY)
Admission: RE | Admit: 2013-02-05 | Discharge: 2013-02-05 | Disposition: A | Discharge status: Home / Self Care | Payer: Medicare Other | Source: Ambulatory Visit | Attending: Vascular Surgery | Admitting: Vascular Surgery

## 2013-02-05 DIAGNOSIS — R0602 Shortness of breath: Secondary | ICD-10-CM | POA: Insufficient documentation

## 2013-02-05 DIAGNOSIS — D649 Anemia, unspecified: Secondary | ICD-10-CM | POA: Insufficient documentation

## 2013-02-05 DIAGNOSIS — I129 Hypertensive chronic kidney disease with stage 1 through stage 4 chronic kidney disease, or unspecified chronic kidney disease: Secondary | ICD-10-CM | POA: Insufficient documentation

## 2013-02-05 DIAGNOSIS — N184 Chronic kidney disease, stage 4 (severe): Secondary | ICD-10-CM | POA: Insufficient documentation

## 2013-02-05 DIAGNOSIS — I739 Peripheral vascular disease, unspecified: Secondary | ICD-10-CM | POA: Insufficient documentation

## 2013-02-05 DIAGNOSIS — E119 Type 2 diabetes mellitus without complications: Secondary | ICD-10-CM | POA: Insufficient documentation

## 2013-02-05 DIAGNOSIS — I2510 Atherosclerotic heart disease of native coronary artery without angina pectoris: Secondary | ICD-10-CM | POA: Insufficient documentation

## 2013-02-05 DIAGNOSIS — N189 Chronic kidney disease, unspecified: Secondary | ICD-10-CM

## 2013-02-05 HISTORY — PX: BASCILIC VEIN TRANSPOSITION: SHX5742

## 2013-02-05 LAB — GLUCOSE, CAPILLARY: Glucose-Capillary: 263 mg/dL — ABNORMAL HIGH (ref 70–99)

## 2013-02-05 LAB — POCT I-STAT 4, (NA,K, GLUC, HGB,HCT)
Glucose, Bld: 275 mg/dL — ABNORMAL HIGH (ref 70–99)
Potassium: 4.2 mEq/L (ref 3.5–5.1)
Sodium: 138 mEq/L (ref 135–145)

## 2013-02-05 SURGERY — TRANSPOSITION, VEIN, BASILIC
Anesthesia: General | Site: Arm Upper | Laterality: Left

## 2013-02-05 MED ORDER — THROMBIN 20000 UNITS EX SOLR
CUTANEOUS | Status: AC
  Filled 2013-02-05: qty 20000

## 2013-02-05 MED ORDER — FENTANYL CITRATE 0.05 MG/ML IJ SOLN
INTRAMUSCULAR | Status: DC | PRN
  Administered 2013-02-05 (×2): 50 ug via INTRAVENOUS

## 2013-02-05 MED ORDER — PROPOFOL 10 MG/ML IV BOLUS
INTRAVENOUS | Status: DC | PRN
  Administered 2013-02-05: 120 mg via INTRAVENOUS

## 2013-02-05 MED ORDER — CARVEDILOL 6.25 MG PO TABS
6.2500 mg | ORAL_TABLET | Freq: Once | ORAL | Status: AC
  Administered 2013-02-05: 6.25 mg via ORAL

## 2013-02-05 MED ORDER — SODIUM CHLORIDE 0.9 % IR SOLN
Status: DC | PRN
  Administered 2013-02-05: 15:00:00

## 2013-02-05 MED ORDER — MIDAZOLAM HCL 5 MG/5ML IJ SOLN
INTRAMUSCULAR | Status: DC | PRN
  Administered 2013-02-05: 1 mg via INTRAVENOUS

## 2013-02-05 MED ORDER — LIDOCAINE HCL (CARDIAC) 20 MG/ML IV SOLN
INTRAVENOUS | Status: DC | PRN
  Administered 2013-02-05: 50 mg via INTRAVENOUS

## 2013-02-05 MED ORDER — CARVEDILOL 3.125 MG PO TABS
ORAL_TABLET | ORAL | Status: AC
  Filled 2013-02-05: qty 2

## 2013-02-05 MED ORDER — 0.9 % SODIUM CHLORIDE (POUR BTL) OPTIME
TOPICAL | Status: DC | PRN
Start: 2013-02-05 — End: 2013-02-05
  Administered 2013-02-05: 1000 mL

## 2013-02-05 MED ORDER — LIDOCAINE HCL (PF) 1 % IJ SOLN
INTRAMUSCULAR | Status: AC
  Filled 2013-02-05: qty 30

## 2013-02-05 MED ORDER — SODIUM CHLORIDE 0.9 % IV SOLN
INTRAVENOUS | Status: DC
  Administered 2013-02-05 (×2): via INTRAVENOUS

## 2013-02-05 MED ORDER — TRAMADOL-ACETAMINOPHEN 37.5-325 MG PO TABS: 1.0000 | ORAL_TABLET | Freq: Four times a day (QID) | ORAL | Status: DC | PRN

## 2013-02-05 SURGICAL SUPPLY — 35 items
CANISTER SUCTION 2500CC (MISCELLANEOUS) ×2 IMPLANT
CLIP TI MEDIUM 24 (CLIP) ×2 IMPLANT
CLIP TI WIDE RED SMALL 24 (CLIP) ×2 IMPLANT
COVER PROBE W GEL 5X96 (DRAPES) ×2 IMPLANT
COVER SURGICAL LIGHT HANDLE (MISCELLANEOUS) ×2 IMPLANT
DECANTER SPIKE VIAL GLASS SM (MISCELLANEOUS) ×2 IMPLANT
DERMABOND ADVANCED (GAUZE/BANDAGES/DRESSINGS) ×1
DERMABOND ADVANCED .7 DNX12 (GAUZE/BANDAGES/DRESSINGS) ×1 IMPLANT
DRAIN PENROSE 1/2X12 LTX STRL (WOUND CARE) IMPLANT
ELECT REM PT RETURN 9FT ADLT (ELECTROSURGICAL) ×2
ELECTRODE REM PT RTRN 9FT ADLT (ELECTROSURGICAL) ×1 IMPLANT
GLOVE BIO SURGEON STRL SZ7.5 (GLOVE) ×4 IMPLANT
GLOVE BIOGEL PI IND STRL 8 (GLOVE) ×2 IMPLANT
GLOVE BIOGEL PI INDICATOR 8 (GLOVE) ×2
GLOVE SS BIOGEL STRL SZ 7 (GLOVE) ×1 IMPLANT
GLOVE SUPERSENSE BIOGEL SZ 7 (GLOVE) ×1
GOWN STRL NON-REIN LRG LVL3 (GOWN DISPOSABLE) ×6 IMPLANT
GOWN STRL REIN XL XLG (GOWN DISPOSABLE) ×4 IMPLANT
KIT BASIN OR (CUSTOM PROCEDURE TRAY) ×2 IMPLANT
KIT ROOM TURNOVER OR (KITS) ×2 IMPLANT
NS IRRIG 1000ML POUR BTL (IV SOLUTION) ×2 IMPLANT
PACK CV ACCESS (CUSTOM PROCEDURE TRAY) ×2 IMPLANT
PAD ARMBOARD 7.5X6 YLW CONV (MISCELLANEOUS) ×4 IMPLANT
SPONGE SURGIFOAM ABS GEL 100 (HEMOSTASIS) IMPLANT
SUT PROLENE 6 0 BV (SUTURE) ×2 IMPLANT
SUT SILK 2 0 SH (SUTURE) ×2 IMPLANT
SUT SILK 3 0 (SUTURE) ×1
SUT SILK 3-0 18XBRD TIE 12 (SUTURE) ×1 IMPLANT
SUT VIC AB 3-0 SH 27 (SUTURE) ×4
SUT VIC AB 3-0 SH 27X BRD (SUTURE) ×4 IMPLANT
SUT VICRYL 4-0 PS2 18IN ABS (SUTURE) ×2 IMPLANT
TOWEL OR 17X24 6PK STRL BLUE (TOWEL DISPOSABLE) ×2 IMPLANT
TOWEL OR 17X26 10 PK STRL BLUE (TOWEL DISPOSABLE) ×2 IMPLANT
UNDERPAD 30X30 INCONTINENT (UNDERPADS AND DIAPERS) ×2 IMPLANT
WATER STERILE IRR 1000ML POUR (IV SOLUTION) ×2 IMPLANT

## 2013-02-05 NOTE — Anesthesia Preprocedure Evaluation (Addendum)
Anesthesia Evaluation  Patient identified by MRN, date of birth, ID band Patient awake    Reviewed: Allergy & Precautions, H&P , NPO status , Patient's Chart, lab work & pertinent test results  History of Anesthesia Complications (+) PONV  Airway       Dental   Pulmonary shortness of breath,  breath sounds clear to auscultation        Cardiovascular hypertension, + CAD and + Peripheral Vascular Disease Rhythm:Regular Rate:Normal     Neuro/Psych    GI/Hepatic negative GI ROS, Neg liver ROS,   Endo/Other  diabetes  Renal/GU Renal disease     Musculoskeletal   Abdominal   Peds  Hematology  (+) anemia ,   Anesthesia Other Findings   Reproductive/Obstetrics                         Anesthesia Physical Anesthesia Plan  ASA: III  Anesthesia Plan: General   Post-op Pain Management:    Induction: Intravenous  Airway Management Planned: LMA  Additional Equipment:   Intra-op Plan:   Post-operative Plan: Extubation in OR  Informed Consent: I have reviewed the patients History and Physical, chart, labs and discussed the procedure including the risks, benefits and alternatives for the proposed anesthesia with the patient or authorized representative who has indicated his/her understanding and acceptance.   Dental advisory given  Plan Discussed with: CRNA and Anesthesiologist  Anesthesia Plan Comments:        Anesthesia Quick Evaluation

## 2013-02-05 NOTE — Transfer of Care (Signed)
Immediate Anesthesia Transfer of Care Note  Patient: Elizabeth [name]  Procedure(s) Performed: Procedure(s): BASCILIC VEIN TRANSPOSITION 2ND STAGE (Left)  Patient Location: PACU  Anesthesia Type:General  Level of Consciousness: sedated  Airway & Oxygen Therapy: Patient Spontanous Breathing and Patient connected to face mask oxygen  Post-op Assessment: Report given to PACU RN and Post -op Vital signs reviewed and stable  Post vital signs: Reviewed and stable  Complications: No apparent anesthesia complications

## 2013-02-05 NOTE — Anesthesia Postprocedure Evaluation (Signed)
  Anesthesia Post-op Note  Patient: Scientist, product/process development  Procedure(s) Performed: Procedure(s): BASCILIC VEIN TRANSPOSITION 2ND STAGE (Left)  Patient Location: PACU  Anesthesia Type:General  Level of Consciousness: awake and alert   Airway and Oxygen Therapy: Patient Spontanous Breathing  Post-op Pain: mild  Post-op Assessment: Post-op Vital signs reviewed  Post-op Vital Signs: stable  Complications: No apparent anesthesia complications

## 2013-02-05 NOTE — Op Note (Signed)
    NAME: Maylee Bare   MRN: [id number] DOB: [birth year]    DATE OF OPERATION: 02/05/2013  PREOP DIAGNOSIS: Stage IV chronic kidney disease  POSTOP DIAGNOSIS: same  PROCEDURE: Second stage brachial vein transposition  SURGEON: Di Kindle. Edilia Bo, MD, FACS  ASSIST: Doreatha Massed PA  ANESTHESIA: Gen.   EBL: minimal  INDICATIONS: [place] Nokes is a 67 y.o. female who presents for a second stage basilic vein transposition. This was done in 2 stages as the vein initially was felt to be very small. However it showed evidence of good maturation.  FINDINGS: the vein was 5 mm.  TECHNIQUE: The patient was taken to the operating room and received a general anesthetic. The left upper extremity was prepped and draped in usual sterile fashion. Using 3 incisions along the medial aspect of the left upper arm the brachial vein was dissected free and mobilized fully. Branches were divided between clips and 3-0 silk ties. Was excellent flow through the fistula and the vein was good size. A pocket was created lateral incisions. Using interrupted 3-0 nylon sutures, the vein was tacked into the tunnel. Stasis was obtained in the wounds and the wounds were irrigated with saline. Each the wound was then closed with deep layer 3-0 Vicryl and the skin closed with 4-0 Vicryl. Dermabond was applied. The patient tolerated the procedure well and was transferred to the recovery room in stable condition. All needle and sponge counts were correct.  Waverly Ferrari, MD, FACS [facility]  DATE OF DICTATION:   02/05/2013

## 2013-02-05 NOTE — Anesthesia Procedure Notes (Signed)
Procedure Name: LMA Insertion Date/Time: 02/05/2013 3:00 PM Performed by: Brien Mates D Pre-anesthesia Checklist: Patient identified, Emergency Drugs available, Suction available, Timeout performed and Patient being monitored Oxygen Delivery Method: Circle system utilized Preoxygenation: Pre-oxygenation with 100% oxygen Intubation Type: IV induction Ventilation: Mask ventilation without difficulty LMA: LMA inserted LMA Size: 3.0 Number of attempts: 2 (first attempt to place  LMA #4 but unable to properly seat.  LMA #3 easily placed  with second attempt) Placement Confirmation: positive ETCO2 and breath sounds checked- equal and bilateral Tube secured with: Tape Dental Injury: Teeth and Oropharynx as per pre-operative assessment

## 2013-02-05 NOTE — Preoperative (Signed)
Beta Blockers   Coreg given in preop holding room.

## 2013-02-05 NOTE — H&P (View-Only) (Signed)
   Patient name: Elizabeth [name] MRN: [id number] DOB: 1945-06-25 Sex: female  REASON FOR VISIT: follow up of left AV fistula.  HPI: Elizabeth [name] is a 67 y.o. female we'll I last saw on 11/28/2012. She had a first stage left basilic vein transposition on 10/30/2012. She was seen in follow up on 11/28/2012. At that time, I did not think that the fistula was adequate in size to proceed with second stage transposition. She comes in today for a follow up duplex scan. She has no complaints referrable to the left upper extremity.   REVIEW OF SYSTEMS: Arly.Keller ] denotes positive finding; [  ] denotes negative finding  CARDIOVASCULAR:  [ ]  chest pain   [ ]  dyspnea on exertion    CONSTITUTIONAL:  [ ]  fever   [ ]  chills  PHYSICAL EXAM: Filed Vitals:   01/30/13 1357  BP: 137/66  Pulse: 97  Height: 5\' 4"  (1.626 m)  Weight: 86 lb (39.009 kg)  SpO2: 100%   Body mass index is 14.75 kg/(m^2). GENERAL: The patient is a well-nourished female, in no acute distress. The vital signs are documented above. CARDIOVASCULAR: There is a regular rate and rhythm. PULMONARY: There is good air exchange bilaterally without wheezing or rales. Her fistula in the left upper arm has a much improved thrill.  DUPLEX: Duplex of her left AV fistula today shows that the fistula is patent. There are elevated velocities in the mid segment of the vein was partially nonocclusive thrombus versus a valve leaflet present in the mid segment of the vein. There also some branches noted. Diameters of the fistula range from 0.37 cm to 0.79 cm.  I have compared this study to her previous study from 11/28/2012. The diameters of the vein have improved significantly.  MEDICAL ISSUES:  End stage renal disease At this point I think it would be reasonable to proceed with the second stage of her basilic vein transposition. This has been scheduled for 02/05/2013.   Esteven Overfelt S [facility] [place] [facility]:  [phone]

## 2013-02-05 NOTE — Interval H&P Note (Signed)
History and Physical Interval Note:  02/05/2013 1:50 PM  Elizabeth [name]  has presented today for surgery, with the diagnosis of ESRD  The various methods of treatment have been discussed with the patient and family. After consideration of risks, benefits and other options for treatment, the patient has consented to  Procedure(s): BASCILIC VEIN TRANSPOSITION 2ND STAGE (Left) as a surgical intervention .  The patient's history has been reviewed, patient examined, no change in status, stable for surgery.  I have reviewed the patient's chart and labs.  Questions were answered to the patient's satisfaction.     DICKSON,CHRISTOPHER S

## 2013-02-06 ENCOUNTER — Telehealth: Payer: Self-pay | Admitting: Vascular Surgery

## 2013-02-06 NOTE — Telephone Encounter (Addendum)
Message copied by Fredrich Birks on Wed Feb 06, 2013  2:23 PM ------      Message from: [place], [place] K      Created: Wed Feb 06, 2013  9:32 AM      Regarding: schedule                   ----- Message -----         From: Dara Lords, PA-C         Sent: 02/05/2013   4:24 PM           To: Sharee Pimple, CMA, [facility] Admin Pool            S/p 2nd stage BVT 02/05/13.  F/u with Dr. Edilia Bo in 4 weeks.            Thanks,      Samantha ------  02/06/13: spoke with pt re appt, dpm

## 2013-02-07 ENCOUNTER — Encounter (HOSPITAL_COMMUNITY): Payer: Self-pay | Admitting: Vascular Surgery

## 2013-02-08 ENCOUNTER — Encounter (HOSPITAL_COMMUNITY)
Admission: RE | Admit: 2013-02-08 | Discharge: 2013-02-08 | Disposition: A | Discharge status: Home / Self Care | Payer: Medicare Other | Source: Ambulatory Visit | Attending: Nephrology | Admitting: Nephrology

## 2013-02-08 LAB — POCT HEMOGLOBIN-HEMACUE: Hemoglobin: 10.1 g/dL — ABNORMAL LOW (ref 12.0–15.0)

## 2013-02-08 MED ORDER — EPOETIN ALFA 20000 UNIT/ML IJ SOLN
20000.0000 [IU] | INTRAMUSCULAR | Status: DC
  Administered 2013-02-08: 11:00:00 20000 [IU] via SUBCUTANEOUS

## 2013-02-08 MED ORDER — EPOETIN ALFA 20000 UNIT/ML IJ SOLN
INTRAMUSCULAR | Status: AC
  Filled 2013-02-08: qty 1

## 2013-02-28 ENCOUNTER — Other Ambulatory Visit (HOSPITAL_COMMUNITY): Payer: Self-pay | Admitting: *Deleted

## 2013-03-01 ENCOUNTER — Encounter (HOSPITAL_COMMUNITY)
Admission: RE | Admit: 2013-03-01 | Discharge: 2013-03-01 | Disposition: A | Discharge status: Home / Self Care | Payer: Medicare Other | Source: Ambulatory Visit | Attending: Nephrology | Admitting: Nephrology

## 2013-03-01 DIAGNOSIS — N184 Chronic kidney disease, stage 4 (severe): Secondary | ICD-10-CM | POA: Insufficient documentation

## 2013-03-01 DIAGNOSIS — D638 Anemia in other chronic diseases classified elsewhere: Secondary | ICD-10-CM | POA: Insufficient documentation

## 2013-03-01 MED ORDER — EPOETIN ALFA 20000 UNIT/ML IJ SOLN
INTRAMUSCULAR | Status: AC
  Filled 2013-03-01: qty 1

## 2013-03-01 MED ORDER — EPOETIN ALFA 20000 UNIT/ML IJ SOLN
20000.0000 [IU] | INTRAMUSCULAR | Status: DC
  Administered 2013-03-01: 20000 [IU] via SUBCUTANEOUS

## 2013-03-04 LAB — POCT HEMOGLOBIN-HEMACUE: Hemoglobin: 10.4 g/dL — ABNORMAL LOW (ref 12.0–15.0)

## 2013-03-05 ENCOUNTER — Encounter: Payer: Self-pay | Admitting: Vascular Surgery

## 2013-03-06 ENCOUNTER — Ambulatory Visit (INDEPENDENT_AMBULATORY_CARE_PROVIDER_SITE_OTHER): Payer: Self-pay | Admitting: Vascular Surgery

## 2013-03-06 ENCOUNTER — Encounter: Payer: Self-pay | Admitting: Vascular Surgery

## 2013-03-06 VITALS — BP 84/59 | HR 95 | Ht 64.0 in | Wt 88.5 lb

## 2013-03-06 DIAGNOSIS — N186 End stage renal disease: Secondary | ICD-10-CM

## 2013-03-06 DIAGNOSIS — Z4931 Encounter for adequacy testing for hemodialysis: Secondary | ICD-10-CM

## 2013-03-06 NOTE — Progress Notes (Signed)
   Patient name: Elizabeth [name] MRN: [id number] DOB: February 05, 1946 Sex: female  REASON FOR VISIT: Follow up after second stage basilic vein transposition  HPI: Elizabeth [name] is a 68 y.o. female who underwent a second stage basilic vein transposition on 02/05/2013. She comes in for a 4 week follow up visit. She did have some left upper extremity swelling which she says resolved. She has no pain or paresthesias in her left hand.  REVIEW OF SYSTEMS: Arly.Keller ] denotes positive finding; [  ] denotes negative finding  CARDIOVASCULAR:  [ ]  chest pain   [ ]  dyspnea on exertion    CONSTITUTIONAL:  [ ]  fever   [ ]  chills  PHYSICAL EXAM: Filed Vitals:   03/06/13 1009  BP: 84/59  Pulse: 95  Height: 5\' 4"  (1.626 m)  Weight: 88 lb 8 oz (40.143 kg)  SpO2: 100%   Body mass index is 15.18 kg/(m^2). GENERAL: The patient is a well-nourished female, in no acute distress. The vital signs are documented above. CARDIOVASCULAR: There is a regular rate and rhythm. PULMONARY: There is good air exchange bilaterally without wheezing or rales. Her left upper arm fistula has an excellent bruit and thrill. She has a palpable left radial pulse.  MEDICAL ISSUES:  End stage renal disease Her basilic vein transposition appears to be maturing adequately. I have ordered a follow up duplex scan in 6 weeks and I'll see her back at that time. She is currently not on dialysis.   DICKSON,CHRISTOPHER S [facility] [facility]: [phone]

## 2013-03-06 NOTE — Addendum Note (Signed)
Addended by: Sharee Pimple on: 03/06/2013 04:22 PM   Modules accepted: Orders

## 2013-03-06 NOTE — Assessment & Plan Note (Signed)
Her basilic vein transposition appears to be maturing adequately. I have ordered a follow up duplex scan in 6 weeks and I'll see her back at that time. She is currently not on dialysis.

## 2013-03-22 ENCOUNTER — Encounter (HOSPITAL_COMMUNITY)
Admission: RE | Admit: 2013-03-22 | Discharge: 2013-03-22 | Disposition: A | Discharge status: Home / Self Care | Payer: Medicare Other | Source: Ambulatory Visit | Attending: Nephrology | Admitting: Nephrology

## 2013-03-22 LAB — POCT HEMOGLOBIN-HEMACUE: Hemoglobin: 11.6 g/dL — ABNORMAL LOW (ref 12.0–15.0)

## 2013-03-22 MED ORDER — EPOETIN ALFA 20000 UNIT/ML IJ SOLN: 20000.0000 [IU] | INTRAMUSCULAR | Status: DC

## 2013-03-29 ENCOUNTER — Other Ambulatory Visit (HOSPITAL_COMMUNITY): Payer: Self-pay | Admitting: Internal Medicine

## 2013-04-01 NOTE — Telephone Encounter (Signed)
Rx was sent to pharmacy electronically. 

## 2013-04-05 ENCOUNTER — Encounter (HOSPITAL_COMMUNITY)
Admission: RE | Admit: 2013-04-05 | Discharge: 2013-04-05 | Disposition: A | Discharge status: Home / Self Care | Payer: Medicare Other | Source: Ambulatory Visit | Attending: Nephrology | Admitting: Nephrology

## 2013-04-05 DIAGNOSIS — D638 Anemia in other chronic diseases classified elsewhere: Secondary | ICD-10-CM | POA: Insufficient documentation

## 2013-04-05 DIAGNOSIS — N184 Chronic kidney disease, stage 4 (severe): Secondary | ICD-10-CM | POA: Insufficient documentation

## 2013-04-05 LAB — IRON AND TIBC
Iron: 101 ug/dL (ref 42–135)
Saturation Ratios: 44 % (ref 20–55)
TIBC: 232 ug/dL — ABNORMAL LOW (ref 250–470)
UIBC: 131 ug/dL (ref 125–400)

## 2013-04-05 LAB — FERRITIN: FERRITIN: 894 ng/mL — AB (ref 10–291)

## 2013-04-05 MED ORDER — EPOETIN ALFA 20000 UNIT/ML IJ SOLN: 20000.0000 [IU] | INTRAMUSCULAR | Status: DC

## 2013-04-08 LAB — POCT HEMOGLOBIN-HEMACUE: HEMOGLOBIN: 11.9 g/dL — AB (ref 12.0–15.0)

## 2013-04-16 ENCOUNTER — Encounter: Payer: Self-pay | Admitting: Vascular Surgery

## 2013-04-17 ENCOUNTER — Ambulatory Visit (HOSPITAL_COMMUNITY)
Admission: RE | Admit: 2013-04-17 | Discharge: 2013-04-17 | Disposition: A | Discharge status: Home / Self Care | Payer: Medicare Other | Source: Ambulatory Visit | Attending: Vascular Surgery | Admitting: Vascular Surgery

## 2013-04-17 ENCOUNTER — Ambulatory Visit (INDEPENDENT_AMBULATORY_CARE_PROVIDER_SITE_OTHER): Payer: Self-pay | Admitting: Vascular Surgery

## 2013-04-17 ENCOUNTER — Encounter: Payer: Self-pay | Admitting: Vascular Surgery

## 2013-04-17 VITALS — BP 146/64 | HR 94 | Ht 64.0 in | Wt 81.9 lb

## 2013-04-17 DIAGNOSIS — Z9889 Other specified postprocedural states: Secondary | ICD-10-CM | POA: Insufficient documentation

## 2013-04-17 DIAGNOSIS — N186 End stage renal disease: Secondary | ICD-10-CM

## 2013-04-17 DIAGNOSIS — Z4931 Encounter for adequacy testing for hemodialysis: Secondary | ICD-10-CM

## 2013-04-17 DIAGNOSIS — Z09 Encounter for follow-up examination after completed treatment for conditions other than malignant neoplasm: Secondary | ICD-10-CM | POA: Insufficient documentation

## 2013-04-17 DIAGNOSIS — N189 Chronic kidney disease, unspecified: Secondary | ICD-10-CM

## 2013-04-17 NOTE — Assessment & Plan Note (Signed)
The fistula is patent and has a good thrill proximally but it is harder to follow distally. Duplex suggests possibly some stenosis within the fistula. I have recommended that we proceed with a fistulogram with limited dye and possible venoplasty. This has been scheduled for 04/29/2013. We have discussed the indications for the procedure and the potential complications including but not limited to bleeding or injury to the fistula with thrombosis. All of her questions were answered she is agreeable to proceed.

## 2013-04-17 NOTE — Progress Notes (Signed)
   Patient name: Elizabeth [name] MRN: [id number] DOB: Sep 20, 1945 Sex: female  REASON FOR VISIT: Follow up of second stage left the basilic vein transposition  HPI: Elizabeth [name] is a 68 y.o. female who is not yet on dialysis. She had a very small basilic vein which I felt was her only option for a fistula. Therefore she had a first stage basilic vein transposition and then subsequently was brought back for second stage basilic vein transposition which was done on 02/05/2013. Elizabeth [name] comes in for routine follow up visit. She complains of some mild swelling in her left hand. Otherwise she denies pain or paresthesias in the left upper extremity.  REVIEW OF SYSTEMS: Arly.Keller ] denotes positive finding; [  ] denotes negative finding  CARDIOVASCULAR:  [ ]  chest pain   [ ]  dyspnea on exertion    CONSTITUTIONAL:  [ ]  fever   [ ]  chills  PHYSICAL EXAM: Filed Vitals:   04/17/13 1326  BP: 146/64  Pulse: 94  Height: 5\' 4"  (1.626 m)  Weight: 81 lb 14.4 oz (37.15 kg)  SpO2: 99%   Body mass index is 14.05 kg/(m^2). GENERAL: The patient is a well-nourished female, in no acute distress. The vital signs are documented above. CARDIOVASCULAR: There is a regular rate and rhythm. PULMONARY: There is good air exchange bilaterally without wheezing or rales. Her fistula has an excellent thrill proximally but then the vein is not appear to enlarge significantly distally. She has a palpable left radial pulse.  I have independently interpreted her venous duplex scan which does show some elevated velocities at the proximal anastomosis and also in the central portion of the fistula. Diameters are as small as 0.21 cm.  MEDICAL ISSUES:  CKD (chronic kidney disease) The fistula is patent and has a good thrill proximally but it is harder to follow distally. Duplex suggests possibly some stenosis within the fistula. I have recommended that we proceed with a fistulogram with limited dye and possible venoplasty. This has been  scheduled for 04/29/2013. We have discussed the indications for the procedure and the potential complications including but not limited to bleeding or injury to the fistula with thrombosis. All of her questions were answered she is agreeable to proceed.   Sharlena Kristensen S [facility] [place] [facility]: [phone]

## 2013-04-18 ENCOUNTER — Other Ambulatory Visit: Payer: Self-pay | Admitting: *Deleted

## 2013-04-26 ENCOUNTER — Encounter (HOSPITAL_COMMUNITY)
Admission: RE | Admit: 2013-04-26 | Discharge: 2013-04-26 | Disposition: A | Discharge status: Home / Self Care | Payer: Medicare Other | Source: Ambulatory Visit | Attending: Nephrology | Admitting: Nephrology

## 2013-04-26 DIAGNOSIS — D638 Anemia in other chronic diseases classified elsewhere: Secondary | ICD-10-CM | POA: Insufficient documentation

## 2013-04-26 DIAGNOSIS — N184 Chronic kidney disease, stage 4 (severe): Secondary | ICD-10-CM | POA: Insufficient documentation

## 2013-04-26 MED ORDER — EPOETIN ALFA 20000 UNIT/ML IJ SOLN: 20000.0000 [IU] | INTRAMUSCULAR | Status: DC

## 2013-04-29 ENCOUNTER — Telehealth: Payer: Self-pay | Admitting: Vascular Surgery

## 2013-04-29 ENCOUNTER — Ambulatory Visit (HOSPITAL_COMMUNITY)
Admission: RE | Admit: 2013-04-29 | Discharge: 2013-04-29 | Disposition: A | Discharge status: Home / Self Care | Payer: Medicare Other | Source: Ambulatory Visit | Attending: Vascular Surgery | Admitting: Vascular Surgery

## 2013-04-29 ENCOUNTER — Encounter (HOSPITAL_COMMUNITY)
Admission: RE | Disposition: A | Discharge status: Home / Self Care | Payer: Self-pay | Source: Ambulatory Visit | Attending: Vascular Surgery

## 2013-04-29 DIAGNOSIS — Y849 Medical procedure, unspecified as the cause of abnormal reaction of the patient, or of later complication, without mention of misadventure at the time of the procedure: Secondary | ICD-10-CM | POA: Insufficient documentation

## 2013-04-29 DIAGNOSIS — T82898A Other specified complication of vascular prosthetic devices, implants and grafts, initial encounter: Secondary | ICD-10-CM

## 2013-04-29 HISTORY — PX: ANGIOPLASTY: SHX39

## 2013-04-29 HISTORY — PX: FISTULOGRAM: SHX5832

## 2013-04-29 LAB — POCT I-STAT, CHEM 8
BUN: 59 mg/dL — ABNORMAL HIGH (ref 6–23)
CALCIUM ION: 1.28 mmol/L (ref 1.13–1.30)
Chloride: 106 mEq/L (ref 96–112)
Creatinine, Ser: 3.2 mg/dL — ABNORMAL HIGH (ref 0.50–1.10)
Glucose, Bld: 226 mg/dL — ABNORMAL HIGH (ref 70–99)
HEMATOCRIT: 41 % (ref 36.0–46.0)
Hemoglobin: 13.9 g/dL (ref 12.0–15.0)
Potassium: 3.9 mEq/L (ref 3.7–5.3)
Sodium: 141 mEq/L (ref 137–147)
TCO2: 24 mmol/L (ref 0–100)

## 2013-04-29 LAB — GLUCOSE, CAPILLARY: Glucose-Capillary: 217 mg/dL — ABNORMAL HIGH (ref 70–99)

## 2013-04-29 LAB — POCT HEMOGLOBIN-HEMACUE: Hemoglobin: 11.7 g/dL — ABNORMAL LOW (ref 12.0–15.0)

## 2013-04-29 SURGERY — FISTULOGRAM
Anesthesia: LOCAL | Laterality: Left

## 2013-04-29 MED ORDER — SODIUM CHLORIDE 0.9 % IJ SOLN: 3.0000 mL | INTRAMUSCULAR | Status: DC | PRN

## 2013-04-29 MED ORDER — HEPARIN (PORCINE) IN NACL 2-0.9 UNIT/ML-% IJ SOLN
INTRAMUSCULAR | Status: AC
  Filled 2013-04-29: qty 1000

## 2013-04-29 MED ORDER — SODIUM CHLORIDE 0.9 % IJ SOLN: 3.0000 mL | Freq: Two times a day (BID) | INTRAMUSCULAR | Status: DC

## 2013-04-29 MED ORDER — LIDOCAINE HCL (PF) 1 % IJ SOLN
INTRAMUSCULAR | Status: AC
Start: 2013-04-29 — End: 2013-04-29
  Filled 2013-04-29: qty 30

## 2013-04-29 MED ORDER — SODIUM CHLORIDE 0.9 % IV SOLN: 250.0000 mL | INTRAVENOUS | Status: DC | PRN

## 2013-04-29 MED ORDER — HEPARIN SODIUM (PORCINE) 1000 UNIT/ML IJ SOLN
INTRAMUSCULAR | Status: AC
  Filled 2013-04-29: qty 1

## 2013-04-29 MED ORDER — LABETALOL HCL 5 MG/ML IV SOLN
INTRAVENOUS | Status: AC
  Filled 2013-04-29: qty 4

## 2013-04-29 NOTE — Discharge Instructions (Signed)
AV Fistula Care After Refer to this sheet in the next few weeks. These instructions provide you with information on caring for yourself after your procedure. Your caregiver may also give you more specific instructions. Your treatment has been planned according to current medical practices, but problems sometimes occur. Call your caregiver if you have any problems or questions after your procedure. HOME CARE INSTRUCTIONS   Your vein will need time to enlarge and mature so needles can be inserted for dialysis. Follow your caregiver's instructions about what you need to do to make this happen.  Keep dressings clean and dry.  Keep the arm elevated above your heart. Use a pillow.  Rest.  Use the arm as usual for all activities.  Have the stitches or tape closures removed in 10 to 14 days, or as directed by your caregiver.  Do not sleep or lie on the area of the fistula or that arm. This may decrease or stop the blood flow through your fistula.  Do not allow blood pressures to be taken on this arm.  Do not allow blood drawing to be done from the graft.  Do not wear tight clothing around the access site or on the arm.  Avoid lifting heavy objects with the arm that has the fistula.  Do not use creams or lotions over the access site. SEEK MEDICAL CARE IF:   You have a fever.  You have swelling around the fistula that gets worse, or you have new pain.  You have unusual bleeding at the fistula site or from any other area.  You have pus or other drainage at the fistula site.  You have skin redness or red streaking on the skin around, above, or below the fistula site.  Your access site feels warm.  You have any flu-like symptoms. SEEK IMMEDIATE MEDICAL CARE IF:   You have pain, numbness, or an unusual pale skin on the hand or on the side of your fistula.  You have dizziness or weakness that you have not had before.  You have shortness of breath.  You have chest pain.  Your  fistula disconnects or breaks, and there is bleeding that cannot be easily controlled. Call for local emergency medical help. Do not try to drive yourself to the hospital. MAKE SURE YOU  Understand these instructions.  Will watch your condition.  Will get help right away if you are not doing well or get worse. Document Released: 02/07/2005 Document Revised: 05/02/2011 Document Reviewed: 07/28/2010 [facility] Patient Information 2014 [place], [place].

## 2013-04-29 NOTE — Telephone Encounter (Addendum)
Message copied by Fredrich Birks on Mon Apr 29, 2013  3:25 PM ------      Message from: [place] K      Created: Mon Apr 29, 2013 11:06 AM      Regarding: Schedule                   ----- Message -----         From: Chuck Hint, MD         Sent: 04/29/2013   8:24 AM           To: [facility] Charge Pool      Subject: charge and f/u                                           PROCEDURE:       1. Ultrasound-guided access to left upper arm AV fistula x2      2. Fistulogram left basilic vein transposition      3. Venoplasty left basilic vein transposition            SURGEON: Di Kindle. Edilia Bo, MD, FACS            She will need a follow up visit in 4 weeks with a duplex of her fistula. Thank you. CD ------  04/29/13: pt phone was "not available"- mailed letter, dpm

## 2013-04-29 NOTE — Op Note (Signed)
   PATIENT: Elizabeth [name]   MRN: [id number] DOB: 10/05/1945    DATE OF PROCEDURE: 04/29/2013  INDICATIONS: [name] is a 68 y.o. female who underwent a second stage of her basilic vein transposition on 02/05/2013. I followed duplex, she had a stenosis in the midportion of the fistula. She presents for a fistulogram and possible venoplasty. She is not yet on dialysis.  PROCEDURE:  1. Ultrasound-guided access to left upper arm AV fistula x2 2. Fistulogram left basilic vein transposition 3. Venoplasty left basilic vein transposition  SURGEON: Di Kindle. Edilia Bo, MD, FACS  ANESTHESIA: local   EBL: minimal  TECHNIQUE: The patient was brought to the peripheral vascular lab. The left upper extremity was prepped and draped in usual sterile fashion. Under ultrasound guidance, after the skin was anesthetized, the proximal fistula was cannulated with a micropuncture needle and a micropuncture sheath was introduced over a wire. A fistulogram was obtained. This demonstrated a stenosis which was fairly close to the catheter site. For this reason I elected to remove the catheter and this was closed with a 4-0 Monocryl suture. I then cannulated the fistula posterior to the anastomosis after the skin was anesthetized and again under ultrasound guidance. A micropuncture sheath was introduced over micropuncture wire and then this was exchanged for a 5 [place] sheath. The patient received 2000 units of IV heparin. Fistulogram again demonstrated the area of stenosis. This was initially ballooned with a 5 mm x 4 cm balloon which was inflated to 24 atmospheres for 1 minute. Completion films showed some residual stenosis. Of note I also compressed the fistula allowing reflux into the arterial anastomosis and I did not see any stenosis at the arterial anastomosis. Then went back with a 6 mm x 2 cm balloon which was inflated to 24 atmospheres for 1 minute. Completion film showed no residual stenosis. The cannulation  site was closed with 4-0 Monocryl. No immediate complications were noted.  FINDINGS:  1. Patent left basilic vein transposition 2. 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  CLINICAL NOTE: The patient will follow up in 4 weeks to check on the fistula.  Waverly Ferrari, MD, FACS [facility]  DATE OF DICTATION:   04/29/2013

## 2013-05-03 NOTE — H&P (Signed)
  Patient name: Myron Sultana MRN: [id number] DOB: 01/16/1946 Sex: female  REASON FOR VISIT: Follow up of second stage left the basilic vein transposition  HPI: Ethelyne Mcclard is a 68 y.o. female who is not yet on dialysis. She had a very small basilic vein which I felt was her only option for a fistula. Therefore she had a first stage basilic vein transposition and then subsequently was brought back for second stage basilic vein transposition which was done on 02/05/2013. He comes in for routine follow up visit. She complains of some mild swelling in her left hand. Otherwise she denies pain or paresthesias in the left upper extremity.  REVIEW OF SYSTEMS: [X ] denotes positive finding; [  ] denotes negative finding  CARDIOVASCULAR:  [ ] chest pain   [ ] dyspnea on exertion    CONSTITUTIONAL:  [ ] fever   [ ] chills  PHYSICAL EXAM: Filed Vitals:   04/17/13 1326  BP: 146/64  Pulse: 94  Height: 5' 4" (1.626 m)  Weight: 81 lb 14.4 oz (37.15 kg)  SpO2: 99%   Body mass index is 14.05 kg/(m^2). GENERAL: The patient is a well-nourished female, in no acute distress. The vital signs are documented above. CARDIOVASCULAR: There is a regular rate and rhythm. PULMONARY: There is good air exchange bilaterally without wheezing or rales. Her fistula has an excellent thrill proximally but then the vein is not appear to enlarge significantly distally. She has a palpable left radial pulse.  I have independently interpreted her venous duplex scan which does show some elevated velocities at the proximal anastomosis and also in the central portion of the fistula. Diameters are as small as 0.21 cm.  MEDICAL ISSUES:  CKD (chronic kidney disease) The fistula is patent and has a good thrill proximally but it is harder to follow distally. Duplex suggests possibly some stenosis within the fistula. I have recommended that we proceed with a fistulogram with limited dye and possible venoplasty. This has been  scheduled for 04/29/2013. We have discussed the indications for the procedure and the potential complications including but not limited to bleeding or injury to the fistula with thrombosis. All of her questions were answered she is agreeable to proceed.   Hermenegildo Clausen S [facility] [place] [facility]: [phone]     

## 2013-05-08 ENCOUNTER — Other Ambulatory Visit: Payer: Self-pay | Admitting: *Deleted

## 2013-05-08 DIAGNOSIS — Z4931 Encounter for adequacy testing for hemodialysis: Secondary | ICD-10-CM

## 2013-05-08 DIAGNOSIS — N186 End stage renal disease: Secondary | ICD-10-CM

## 2013-05-10 ENCOUNTER — Encounter (HOSPITAL_COMMUNITY)
Admission: RE | Admit: 2013-05-10 | Discharge: 2013-05-10 | Disposition: A | Discharge status: Home / Self Care | Payer: Medicare Other | Source: Ambulatory Visit | Attending: Nephrology | Admitting: Nephrology

## 2013-05-10 LAB — POCT HEMOGLOBIN-HEMACUE: HEMOGLOBIN: 10.5 g/dL — AB (ref 12.0–15.0)

## 2013-05-10 MED ORDER — EPOETIN ALFA 20000 UNIT/ML IJ SOLN
20000.0000 [IU] | INTRAMUSCULAR | Status: DC
  Administered 2013-05-10: 20000 [IU] via SUBCUTANEOUS

## 2013-05-10 MED ORDER — EPOETIN ALFA 20000 UNIT/ML IJ SOLN
INTRAMUSCULAR | Status: AC
  Filled 2013-05-10: qty 1

## 2013-05-21 ENCOUNTER — Encounter: Payer: Self-pay | Admitting: Vascular Surgery

## 2013-05-22 ENCOUNTER — Inpatient Hospital Stay (HOSPITAL_COMMUNITY): Admission: RE | Admit: 2013-05-22 | Payer: Medicare Other | Source: Ambulatory Visit

## 2013-05-22 ENCOUNTER — Encounter: Payer: Medicare Other | Admitting: Vascular Surgery

## 2013-05-31 ENCOUNTER — Encounter (HOSPITAL_COMMUNITY)
Admission: RE | Admit: 2013-05-31 | Discharge: 2013-05-31 | Disposition: A | Discharge status: Home / Self Care | Payer: Medicare Other | Source: Ambulatory Visit | Attending: Nephrology | Admitting: Nephrology

## 2013-05-31 DIAGNOSIS — D638 Anemia in other chronic diseases classified elsewhere: Secondary | ICD-10-CM | POA: Insufficient documentation

## 2013-05-31 DIAGNOSIS — N184 Chronic kidney disease, stage 4 (severe): Secondary | ICD-10-CM | POA: Insufficient documentation

## 2013-05-31 LAB — POCT HEMOGLOBIN-HEMACUE: HEMOGLOBIN: 11.4 g/dL — AB (ref 12.0–15.0)

## 2013-05-31 MED ORDER — EPOETIN ALFA 20000 UNIT/ML IJ SOLN
20000.0000 [IU] | INTRAMUSCULAR | Status: DC
  Administered 2013-05-31: 20000 [IU] via SUBCUTANEOUS

## 2013-05-31 MED ORDER — EPOETIN ALFA 20000 UNIT/ML IJ SOLN
INTRAMUSCULAR | Status: AC
  Administered 2013-05-31: 20000 [IU] via SUBCUTANEOUS
  Filled 2013-05-31: qty 1

## 2013-06-11 ENCOUNTER — Encounter: Payer: Self-pay | Admitting: Vascular Surgery

## 2013-06-12 ENCOUNTER — Ambulatory Visit (INDEPENDENT_AMBULATORY_CARE_PROVIDER_SITE_OTHER): Payer: Self-pay | Admitting: Vascular Surgery

## 2013-06-12 ENCOUNTER — Ambulatory Visit (HOSPITAL_COMMUNITY)
Admission: RE | Admit: 2013-06-12 | Discharge: 2013-06-12 | Disposition: A | Discharge status: Home / Self Care | Payer: Medicare Other | Source: Ambulatory Visit | Attending: Vascular Surgery | Admitting: Vascular Surgery

## 2013-06-12 ENCOUNTER — Encounter: Payer: Self-pay | Admitting: Vascular Surgery

## 2013-06-12 VITALS — BP 153/71 | HR 95 | Ht 64.0 in | Wt 85.0 lb

## 2013-06-12 DIAGNOSIS — N186 End stage renal disease: Secondary | ICD-10-CM | POA: Insufficient documentation

## 2013-06-12 DIAGNOSIS — Z4931 Encounter for adequacy testing for hemodialysis: Secondary | ICD-10-CM | POA: Insufficient documentation

## 2013-06-12 NOTE — Assessment & Plan Note (Signed)
Her fistula appears to be maturing adequately. I think this should be usable for access if it is needed. We will be happy to see her back at any time if there are any problems with it.

## 2013-06-12 NOTE — Progress Notes (Signed)
   Patient name: Elizabeth [name] MRN: [id number] DOB: [birth year] Sex: female  REASON FOR VISIT: Follow up after venoplasty of left basilic vein transposition.  HPI: [name] is a 68 y.o. female who had a left basilic vein transposition in September of 2014. The second stage was performed in December of 2014. 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 She comes in for a follow up visit. She has not yet on dialysis. She has no pain or paresthesias in her left upper extremity.  REVIEW OF SYSTEMS: Arly.Keller ] denotes positive finding; [  ] denotes negative finding  CARDIOVASCULAR:  [ ]  chest pain   [ ]  dyspnea on exertion    CONSTITUTIONAL:  [ ]  fever   [ ]  chills  PHYSICAL EXAM: Filed Vitals:   06/12/13 1117  BP: 153/71  Pulse: 95  Height: 5\' 4"  (1.626 m)  Weight: 85 lb (38.556 kg)  SpO2: 100%   Body mass index is 14.58 kg/(m^2). GENERAL: The patient is a well-nourished female, in no acute distress. The vital signs are documented above. CARDIOVASCULAR: There is a regular rate and rhythm. She has a left radial pulse. PULMONARY: There is good air exchange bilaterally without wheezing or rales. Her left upper arm fistula has an excellent thrill and bruit.  I have independently interpreted her duplex scan today which shows that her fistula is widely patent. There is some elevated velocities in the fistula above the previous area however this may simply be due to the change in the diameter of the vein and not intimal hyperplasia. The vein appears to be a reasonable in size with diameters ranging from 0.57-0.72 cm.  MEDICAL ISSUES:  End stage renal disease Her fistula appears to be maturing adequately. I think this should be usable for access if it is needed. We will be happy to see her back at any time if there are any problems with it.   Chuck Hint [facility] [place] [facility]: [phone]

## 2013-06-21 ENCOUNTER — Encounter (HOSPITAL_COMMUNITY): Payer: Medicare Other

## 2013-06-26 ENCOUNTER — Encounter (HOSPITAL_COMMUNITY)
Admission: RE | Admit: 2013-06-26 | Discharge: 2013-06-26 | Disposition: A | Discharge status: Home / Self Care | Payer: Medicare Other | Source: Ambulatory Visit | Attending: Nephrology | Admitting: Nephrology

## 2013-06-26 DIAGNOSIS — D638 Anemia in other chronic diseases classified elsewhere: Secondary | ICD-10-CM | POA: Insufficient documentation

## 2013-06-26 DIAGNOSIS — N184 Chronic kidney disease, stage 4 (severe): Secondary | ICD-10-CM | POA: Insufficient documentation

## 2013-06-26 LAB — POCT HEMOGLOBIN-HEMACUE: Hemoglobin: 12.7 g/dL (ref 12.0–15.0)

## 2013-06-26 LAB — FERRITIN: Ferritin: 661 ng/mL — ABNORMAL HIGH (ref 10–291)

## 2013-06-26 LAB — IRON AND TIBC
Iron: 67 ug/dL (ref 42–135)
SATURATION RATIOS: 28 % (ref 20–55)
TIBC: 241 ug/dL — ABNORMAL LOW (ref 250–470)
UIBC: 174 ug/dL (ref 125–400)

## 2013-06-26 MED ORDER — EPOETIN ALFA 20000 UNIT/ML IJ SOLN: 20000.0000 [IU] | INTRAMUSCULAR | Status: DC

## 2013-07-10 ENCOUNTER — Encounter (HOSPITAL_COMMUNITY)
Admission: RE | Admit: 2013-07-10 | Discharge: 2013-07-10 | Disposition: A | Discharge status: Home / Self Care | Payer: Medicare Other | Source: Ambulatory Visit | Attending: Nephrology | Admitting: Nephrology

## 2013-07-10 LAB — POCT HEMOGLOBIN-HEMACUE: HEMOGLOBIN: 11.1 g/dL — AB (ref 12.0–15.0)

## 2013-07-10 MED ORDER — EPOETIN ALFA 20000 UNIT/ML IJ SOLN
20000.0000 [IU] | INTRAMUSCULAR | Status: DC
  Administered 2013-07-10: 20000 [IU] via SUBCUTANEOUS

## 2013-07-10 MED ORDER — EPOETIN ALFA 20000 UNIT/ML IJ SOLN
INTRAMUSCULAR | Status: AC
  Filled 2013-07-10: qty 1

## 2013-07-31 ENCOUNTER — Encounter (HOSPITAL_COMMUNITY): Payer: Medicare Other

## 2013-08-09 ENCOUNTER — Encounter (HOSPITAL_COMMUNITY)
Admission: RE | Admit: 2013-08-09 | Discharge: 2013-08-09 | Disposition: A | Discharge status: Home / Self Care | Payer: Medicare Other | Source: Ambulatory Visit | Attending: Nephrology | Admitting: Nephrology

## 2013-08-09 DIAGNOSIS — N184 Chronic kidney disease, stage 4 (severe): Secondary | ICD-10-CM | POA: Insufficient documentation

## 2013-08-09 DIAGNOSIS — D638 Anemia in other chronic diseases classified elsewhere: Secondary | ICD-10-CM | POA: Insufficient documentation

## 2013-08-09 MED ORDER — EPOETIN ALFA 20000 UNIT/ML IJ SOLN
20000.0000 [IU] | INTRAMUSCULAR | Status: DC
  Administered 2013-08-09: 20000 [IU] via SUBCUTANEOUS

## 2013-08-09 MED ORDER — EPOETIN ALFA 20000 UNIT/ML IJ SOLN
INTRAMUSCULAR | Status: AC
  Filled 2013-08-09: qty 1

## 2013-08-12 LAB — POCT HEMOGLOBIN-HEMACUE: HEMOGLOBIN: 11.4 g/dL — AB (ref 12.0–15.0)

## 2013-08-19 ENCOUNTER — Emergency Department (HOSPITAL_COMMUNITY): Payer: Medicare Other

## 2013-08-19 ENCOUNTER — Observation Stay (HOSPITAL_COMMUNITY)
Admission: EM | Admit: 2013-08-19 | Discharge: 2013-08-20 | Disposition: A | Discharge status: Home / Self Care | Payer: Medicare Other | Attending: Internal Medicine | Admitting: Internal Medicine

## 2013-08-19 ENCOUNTER — Encounter (HOSPITAL_COMMUNITY): Payer: Self-pay | Admitting: Emergency Medicine

## 2013-08-19 DIAGNOSIS — N319 Neuromuscular dysfunction of bladder, unspecified: Secondary | ICD-10-CM | POA: Diagnosis not present

## 2013-08-19 DIAGNOSIS — N179 Acute kidney failure, unspecified: Secondary | ICD-10-CM | POA: Insufficient documentation

## 2013-08-19 DIAGNOSIS — I499 Cardiac arrhythmia, unspecified: Secondary | ICD-10-CM | POA: Insufficient documentation

## 2013-08-19 DIAGNOSIS — E1149 Type 2 diabetes mellitus with other diabetic neurological complication: Secondary | ICD-10-CM | POA: Diagnosis not present

## 2013-08-19 DIAGNOSIS — Z8673 Personal history of transient ischemic attack (TIA), and cerebral infarction without residual deficits: Secondary | ICD-10-CM

## 2013-08-19 DIAGNOSIS — R55 Syncope and collapse: Principal | ICD-10-CM | POA: Insufficient documentation

## 2013-08-19 DIAGNOSIS — R4020 Unspecified coma: Secondary | ICD-10-CM | POA: Diagnosis present

## 2013-08-19 DIAGNOSIS — E1142 Type 2 diabetes mellitus with diabetic polyneuropathy: Secondary | ICD-10-CM | POA: Diagnosis not present

## 2013-08-19 DIAGNOSIS — D638 Anemia in other chronic diseases classified elsewhere: Secondary | ICD-10-CM | POA: Insufficient documentation

## 2013-08-19 DIAGNOSIS — E119 Type 2 diabetes mellitus without complications: Secondary | ICD-10-CM

## 2013-08-19 DIAGNOSIS — N189 Chronic kidney disease, unspecified: Secondary | ICD-10-CM | POA: Diagnosis present

## 2013-08-19 DIAGNOSIS — E785 Hyperlipidemia, unspecified: Secondary | ICD-10-CM | POA: Insufficient documentation

## 2013-08-19 DIAGNOSIS — N186 End stage renal disease: Secondary | ICD-10-CM | POA: Diagnosis present

## 2013-08-19 DIAGNOSIS — E1169 Type 2 diabetes mellitus with other specified complication: Secondary | ICD-10-CM | POA: Diagnosis not present

## 2013-08-19 DIAGNOSIS — N184 Chronic kidney disease, stage 4 (severe): Secondary | ICD-10-CM | POA: Diagnosis not present

## 2013-08-19 DIAGNOSIS — I129 Hypertensive chronic kidney disease with stage 1 through stage 4 chronic kidney disease, or unspecified chronic kidney disease: Secondary | ICD-10-CM | POA: Diagnosis not present

## 2013-08-19 DIAGNOSIS — R112 Nausea with vomiting, unspecified: Secondary | ICD-10-CM | POA: Insufficient documentation

## 2013-08-19 DIAGNOSIS — I2510 Atherosclerotic heart disease of native coronary artery without angina pectoris: Secondary | ICD-10-CM | POA: Diagnosis not present

## 2013-08-19 DIAGNOSIS — T68XXXA Hypothermia, initial encounter: Secondary | ICD-10-CM | POA: Diagnosis not present

## 2013-08-19 DIAGNOSIS — E162 Hypoglycemia, unspecified: Secondary | ICD-10-CM

## 2013-08-19 DIAGNOSIS — Z794 Long term (current) use of insulin: Secondary | ICD-10-CM | POA: Insufficient documentation

## 2013-08-19 DIAGNOSIS — R7989 Other specified abnormal findings of blood chemistry: Secondary | ICD-10-CM

## 2013-08-19 DIAGNOSIS — I739 Peripheral vascular disease, unspecified: Secondary | ICD-10-CM | POA: Diagnosis not present

## 2013-08-19 DIAGNOSIS — T68XXXS Hypothermia, sequela: Secondary | ICD-10-CM

## 2013-08-19 HISTORY — DX: Unspecified coma: R40.20

## 2013-08-19 HISTORY — DX: Personal history of transient ischemic attack (TIA), and cerebral infarction without residual deficits: Z86.73

## 2013-08-19 LAB — I-STAT CHEM 8, ED
BUN: 106 mg/dL — ABNORMAL HIGH (ref 6–23)
CHLORIDE: 101 meq/L (ref 96–112)
CREATININE: 4.3 mg/dL — AB (ref 0.50–1.10)
Calcium, Ion: 1.16 mmol/L (ref 1.13–1.30)
Glucose, Bld: 75 mg/dL (ref 70–99)
HCT: 45 % (ref 36.0–46.0)
HEMOGLOBIN: 15.3 g/dL — AB (ref 12.0–15.0)
POTASSIUM: 3.7 meq/L (ref 3.7–5.3)
SODIUM: 140 meq/L (ref 137–147)
TCO2: 27 mmol/L (ref 0–100)

## 2013-08-19 LAB — BASIC METABOLIC PANEL
BUN: 108 mg/dL — ABNORMAL HIGH (ref 6–23)
CALCIUM: 9.4 mg/dL (ref 8.4–10.5)
CHLORIDE: 100 meq/L (ref 96–112)
CO2: 27 meq/L (ref 19–32)
Creatinine, Ser: 4.22 mg/dL — ABNORMAL HIGH (ref 0.50–1.10)
GFR calc Af Amer: 11 mL/min — ABNORMAL LOW (ref >90)
GFR calc non Af Amer: 10 mL/min — ABNORMAL LOW (ref >90)
Glucose, Bld: 78 mg/dL (ref 70–99)
POTASSIUM: 3.9 meq/L (ref 3.7–5.3)
SODIUM: 143 meq/L (ref 137–147)

## 2013-08-19 LAB — URINALYSIS, ROUTINE W REFLEX MICROSCOPIC
Bilirubin Urine: NEGATIVE
GLUCOSE, UA: 250 mg/dL — AB
Ketones, ur: NEGATIVE mg/dL
Nitrite: NEGATIVE
PROTEIN: 100 mg/dL — AB
SPECIFIC GRAVITY, URINE: 1.012 (ref 1.005–1.030)
Urobilinogen, UA: 0.2 mg/dL (ref 0.0–1.0)
pH: 7 (ref 5.0–8.0)

## 2013-08-19 LAB — GLUCOSE, CAPILLARY
GLUCOSE-CAPILLARY: 241 mg/dL — AB (ref 70–99)
Glucose-Capillary: 222 mg/dL — ABNORMAL HIGH (ref 70–99)
Glucose-Capillary: 308 mg/dL — ABNORMAL HIGH (ref 70–99)

## 2013-08-19 LAB — CBC
HCT: 39.1 % (ref 36.0–46.0)
HEMOGLOBIN: 12.3 g/dL (ref 12.0–15.0)
MCH: 26.6 pg (ref 26.0–34.0)
MCHC: 31.5 g/dL (ref 30.0–36.0)
MCV: 84.4 fL (ref 78.0–100.0)
PLATELETS: 236 10*3/uL (ref 150–400)
RBC: 4.63 MIL/uL (ref 3.87–5.11)
RDW: 16 % — ABNORMAL HIGH (ref 11.5–15.5)
WBC: 6.3 10*3/uL (ref 4.0–10.5)

## 2013-08-19 LAB — HEPATIC FUNCTION PANEL
ALT: 21 U/L (ref 0–35)
AST: 24 U/L (ref 0–37)
Albumin: 2.7 g/dL — ABNORMAL LOW (ref 3.5–5.2)
Alkaline Phosphatase: 92 U/L (ref 39–117)
Total Bilirubin: <0.2 mg/dL — ABNORMAL LOW (ref 0.3–1.2)
Total Protein: 6.9 g/dL (ref 6.0–8.3)

## 2013-08-19 LAB — CBG MONITORING, ED
GLUCOSE-CAPILLARY: 101 mg/dL — AB (ref 70–99)
GLUCOSE-CAPILLARY: 127 mg/dL — AB (ref 70–99)

## 2013-08-19 LAB — RAPID URINE DRUG SCREEN, HOSP PERFORMED
Amphetamines: NOT DETECTED
BARBITURATES: NOT DETECTED
Benzodiazepines: NOT DETECTED
Cocaine: NOT DETECTED
OPIATES: NOT DETECTED
TETRAHYDROCANNABINOL: NOT DETECTED

## 2013-08-19 LAB — URINE MICROSCOPIC-ADD ON

## 2013-08-19 LAB — CK: Total CK: 90 U/L (ref 7–177)

## 2013-08-19 LAB — I-STAT TROPONIN, ED: Troponin i, poc: 0 ng/mL (ref 0.00–0.08)

## 2013-08-19 LAB — I-STAT CG4 LACTIC ACID, ED: Lactic Acid, Venous: 1.61 mmol/L (ref 0.5–2.2)

## 2013-08-19 LAB — TROPONIN I

## 2013-08-19 LAB — TSH: TSH: 1.16 u[IU]/mL (ref 0.350–4.500)

## 2013-08-19 MED ORDER — SODIUM CHLORIDE 0.9 % IJ SOLN: 3.0000 mL | INTRAMUSCULAR | Status: DC | PRN

## 2013-08-19 MED ORDER — SODIUM CHLORIDE 0.9 % IJ SOLN: 3.0000 mL | Freq: Two times a day (BID) | INTRAMUSCULAR | Status: DC

## 2013-08-19 MED ORDER — SODIUM CHLORIDE 0.9 % IV SOLN
INTRAVENOUS | Status: DC
  Administered 2013-08-19: 16:00:00 via INTRAVENOUS

## 2013-08-19 MED ORDER — SODIUM CHLORIDE 0.9 % IV SOLN
INTRAVENOUS | Status: AC
  Administered 2013-08-19: 21:00:00 via INTRAVENOUS

## 2013-08-19 MED ORDER — SODIUM CHLORIDE 0.9 % IJ SOLN
3.0000 mL | Freq: Two times a day (BID) | INTRAMUSCULAR | Status: DC
  Administered 2013-08-20: 3 mL via INTRAVENOUS

## 2013-08-19 MED ORDER — HEPARIN SODIUM (PORCINE) 5000 UNIT/ML IJ SOLN
5000.0000 [IU] | Freq: Three times a day (TID) | INTRAMUSCULAR | Status: DC
  Administered 2013-08-19 – 2013-08-20 (×3): 5000 [IU] via SUBCUTANEOUS
  Filled 2013-08-19 (×5): qty 1

## 2013-08-19 MED ORDER — SODIUM CHLORIDE 0.9 % IV SOLN: 250.0000 mL | INTRAVENOUS | Status: DC | PRN

## 2013-08-19 MED ORDER — ASPIRIN EC 81 MG PO TBEC
81.0000 mg | DELAYED_RELEASE_TABLET | Freq: Every day | ORAL | Status: DC
  Administered 2013-08-20: 81 mg via ORAL
  Filled 2013-08-19: qty 1

## 2013-08-19 MED ORDER — CARVEDILOL 6.25 MG PO TABS
6.2500 mg | ORAL_TABLET | Freq: Two times a day (BID) | ORAL | Status: DC
  Administered 2013-08-20 (×2): 6.25 mg via ORAL
  Filled 2013-08-19 (×4): qty 1

## 2013-08-19 NOTE — ED Notes (Signed)
Pt eating graham crackers

## 2013-08-19 NOTE — ED Notes (Signed)
TO XRAY

## 2013-08-19 NOTE — ED Notes (Signed)
PT AT PMD IN WAITING ROOM AND BECAME UNRESPONSIVE MEDIC ARRIVAL PT HAD GLUCOSE 44 IV STARTED IN LT FOREARM BY MEDICS WHO DID NOT KNOW PT HAD FISTULA IN LT ARM D25W GIVEN GLUCOSE ELEVATED TO 291 THEN 165 ON ARRIVAL TO ER

## 2013-08-19 NOTE — Progress Notes (Signed)
Patient deferred admission questions until AM.

## 2013-08-19 NOTE — H&P (Signed)
Date: 08/19/2013               Patient Name:  Elizabeth [name] MRN: [id number]  DOB: 09/09/1945 Age / Sex: 68 y.o., female   PCP: [name], MD         Medical Service: Internal Medicine Teaching Service         Attending Physician: Dr. Aletta Edouard, MD    First Contact: Dr. Inocente Salles Pager: [phone]  Second Contact: Dr. 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  She reports a CBG of > 300 upon waking around 9AM this morning.  She took 4 units of insulin to cover and proceeded with her morning but did not eat breakfast.  Around 11AM she began to feel as if her BG was low.  She denies diaphoresis or hunger, but felt it was low so she drank some ginger ale (< 8oz.).  She continued to feel as if her BG was low.  At that time her ride was on his way to pick her up for a lab appointment so she ate a few bites of pie in an attempt to raise her BG.  By the time her ride arrived she says she was "incoherent."  She understood he was asking her questions but she could not respond to him.  She says she lost consciousness for a moment.  When she woke EMS was at her home.  She says a similar incident happened about three months ago.  She denies recent illness, chest pain, dyspnea, weakness or headache.  She has chronic N/V for which she has been prescribed an anti-emetic by her PCP.  She is not nauseous presently.  She had a few days of diarrhea last week that has resolved with imodium.  She has had a chronic indwelling foley for about 2 years.  A nurse comes to her home to change it  monthly.  She denies dysuria.  She says her Lasix was increased from 40mg  BID to 80mg  BID last week.  She usually only takes it about 4 days out of the week.  Though her there has been no change in her appetite, she generally does not eat regularly scheduled meals.  She tends to eat her largest meal at night and does not adhere to a diet.  She had chicken and a hamburger for dinner last night, followed by strawberry cobbler (made with Splenda) for dessert.  CBG was in the 200s prior to taking Lantus 15 units at bedtime.    In the ED:  T 94.35F (rectal), HR 77, BP 132-169/49-84mmHg, RR 12, SpO2 100%, CBG 101; a Bear Hugger was placed for warming.  Meds: Current Facility-Administered Medications  Medication Dose Route Frequency Provider Last Rate Last Dose  . 0.9 %  sodium chloride infusion  250 mL Intravenous PRN Na Li, MD      . 0.9 %  sodium chloride infusion   Intravenous Continuous Na Li, MD      . Melene Muller ON 08/20/2013] aspirin EC tablet 81 mg  81 mg  Oral Daily Na Dierdre Searles, MD      . Melene Muller ON 08/20/2013] carvedilol (COREG) tablet 6.25 mg  6.25 mg Oral BID WC Na Li, MD      . heparin injection 5,000 Units  5,000 Units Subcutaneous 3 times per day Na Li, MD      . sodium chloride 0.9 % injection 3 mL  3 mL Intravenous Q12H Na Li, MD      . sodium chloride 0.9 % injection 3 mL  3 mL Intravenous Q12H Na Li, MD      . sodium chloride 0.9 % injection 3 mL  3 mL Intravenous PRN Dede Query, MD        Allergies: Allergies as of 08/19/2013  . (No Known Allergies)   Past Medical History  Diagnosis Date  . Hyperlipidemia   . Diabetes mellitus without complication     TAKES INSULIN   . Urinary retention     DX NEUROGENIC BLADDER--HAS INDWELLING FOLEY CATHETER  . Arthritis   . Anemia   . CAD (coronary artery disease)   . Neuropathic pain   . Shortness of breath     WITH EXERTION  . Stroke     patient not aware.  . Foley catheter in place     for urinary retention  . Critical lower limb ischemia   .  Hypertension   . Peripheral vascular disease   . Chronic kidney disease     PT TOLD RECENT TESTING SHOWS KIDNEY DAMAGE--IS SEEING NEPHROLOGIST.  OFFICE NOTES OBTAINED DR. MATTINGLY -"CHRONIC KIDNEY DISEASE 4 MOST LIKELY SECONDARY TO DIABETES"   Past Surgical History  Procedure Laterality Date  . No previous surgery    . Insertion of suprapubic catheter N/A 05/16/2012    Procedure: INSERTION OF SUPRAPUBIC CATHETER;  Surgeon: Sebastian Ache, MD;  Location: [facility];  Service: Urology;  Laterality: N/A;  . Bascilic vein transposition Left 10/30/2012    Procedure: LEFT 1ST STAGE BASCILIC VEIN TRANSPOSITION;  Surgeon: Chuck Hint, MD;  Location: [facility] OR;  Service: Vascular;  Laterality: Left;  . Nm myocar perf wall motion  04/19/2012    low risk study-EF 44%,mild inferolateral hypkinesis  . Bascilic vein transposition Left 02/05/2013    Procedure: BASCILIC VEIN TRANSPOSITION 2ND STAGE;  Surgeon: Chuck Hint, MD;  Location: [facility] OR;  Service: Vascular;  Laterality: Left;   Family History  Problem Relation Age of Onset  . Hyperlipidemia Mother   . Hypertension Mother   . Hodgkin's lymphoma Father   . Heart disease Sister    History   Social History  . Marital Status: Single    Spouse Name: N/A    Number of Children: N/A  . Years of Education: N/A   Occupational History  . Not on file.   Social History Main Topics  . Smoking status: Never Smoker   . Smokeless tobacco: Never Used  . Alcohol Use: No     Comment: 1 OR 2 BEER A WEEK  . Drug Use: No  . Sexual Activity: Not on file   Other Topics Concern  . Not on file   Social History Narrative  . No narrative on file    Review of Systems: Pertinent items are noted in HPI. General:  Denies unexplained weight loss, fever or change in appetite HEENT:  Denies sore throat, rhinorrhea or URI symptoms Lungs:  Denies dyspnea GI:  Reports chronic N/V (occasional) and recent diarrhea (a few days last week, now resolved);  denies abdominal pain. GU:  Chronic indwelling foley catheter, denies dysuria or change in color or urine Neuro:  Reports occasional blurred vision; denies unilateral weakness or headache  Physical Exam: Blood pressure 130/66, pulse 91, temperature 98.3 F (36.8 C), temperature source Oral, resp. rate 16, height 5\' 3"  (1.6 m), weight 40.824 kg (90 lb), SpO2 99.00%. General: resting in bed in NAD HEENT: PERRL, EOMI, missing teeth Cardiac: RRR, no rubs, murmurs or gallops Pulm: clear to auscultation bilaterally, moving normal volumes of air Abd: soft, nontender, nondistended, BS present GU:  Foley cathter in place; bag is empty (recently changed) Ext: warm and well perfused, trace LE edema B/L, 2+ DPs, no wounds on feet Neuro: alert and oriented X3, cranial nerves II-XII grossly intact, her speech is normal, she is following commands, strength and sensation intact upper and lower extremities, she is able to move all extremities voluntarily Psych:  Her mood, affect and behavior are normal.  Lab results: Basic Metabolic Panel:  Recent Labs  14/78/2906/29/15 1447 08/19/13 1456  NA 143 140  K 3.9 3.7  CL 100 101  CO2 27  --   GLUCOSE 78 75  BUN 108* 106*  CREATININE 4.22* 4.30*  CALCIUM 9.4  --    CBC:  Recent Labs  08/19/13 1447 08/19/13 1456  WBC 6.3  --   HGB 12.3 15.3*  HCT 39.1 45.0  MCV 84.4  --   PLT 236  --    Cardiac Enzymes:  Recent Labs  08/19/13 1531  CKTOTAL 90  poc Troponin negative  CBG:  Recent Labs  08/19/13 1434 08/19/13 1703  GLUCAP 101* 127*   Misc. Labs: Lactic acid 1.61  Imaging results:  Dg Chest 2 View  08/19/2013   CLINICAL DATA:  Loss of consciousness.  Hypoglycemia.  EXAM: CHEST  2 VIEW  COMPARISON:  03/28/2012  FINDINGS: The cardiac silhouette, mediastinal and hilar contours are within normal limits and stable. There is tortuosity and calcification of the thoracic aorta. The lungs are clear. Mild hyperinflation. No infiltrates, edema or  effusions. The bony thorax is intact.  IMPRESSION: Mild hyperinflation but no acute pulmonary findings.   Electronically Signed   By: Loralie ChampagneMark  Gallerani M.D.   On: 08/19/2013 16:22   Ct Head Wo Contrast  08/19/2013   CLINICAL DATA:  Headache and dizziness  EXAM: CT HEAD WITHOUT CONTRAST  TECHNIQUE: Contiguous axial images were obtained from the base of the skull through the vertex without intravenous contrast.  COMPARISON:  None.  FINDINGS: Sinuses/Soft tissues: Mucosal thickening of the left sphenoid sinus is mild. The frontal sinuses are hypoplastic.  Intracranial: No mass lesion, hemorrhage, hydrocephalus, acute infarct, intra-axial, or extra-axial fluid collection.  IMPRESSION: 1.  No acute intracranial abnormality. 2. Minimal sinus disease.   Electronically Signed   By: Jeronimo GreavesKyle  Talbot M.D.   On: 08/19/2013 16:49    Other results: EKG: 75bpm, NSR, LVH  Assessment & Plan by Problem:  Syncope 2/2 to hypoglycemia:  No focal neurologic deficit and CT head negative making neurologic cause less likely.  No chest pain, arrythmia and CE negative making cardiac cause less likely.  She reports feeling as if her BG was low and she only had small cup of ginger ale and a few bites of pie.  BG was 44 per EMS report.  Low CBG this AM likely 2/2 to decreased po after injecting Novolog.  She is mentating normally and wants to eat. - admit to IMTS  - monitor on telemetry - CE x 3 - hold insulin  -  CBG monitoring q2h for 10 hours, then adjust  - AM BMP, TSH - carb modified diet  AKI on CKD combined with progressive CKD:  Cr 4.22 at admission.  Baseline Cr 1.9-2.2.  BUN:Cr > 20:1 consistent with pre-renal etiology likely 2/2 to recent increase in Lasix dose.  She is followed by Dr. Briant Cedar.  Has been CKD4 in the past but she was seen by him last week and per her report his office called to inform her the renal function was worsening and she should have repeat lab work.  She was on her way to get labs repeated today  prior to her syncopal episode.  GFR today is 11 c/w ESRD.  She has a fistula in place.  - NS at 75cc/hr x 8 hours - hold Lasix - follow-up UA, urine urea and creatinine - repeat AM BMP - consider consult to nephrology if Cr does not respond to fluids or there is worsening of Cr, BUN. - follow up with Dr. Briant Cedar as an outpatient  Hypothermia:  Initial T 94.62F.  Likely 2/2 to hypoglycemia since temperature has normalized with treatment of low blood glucose.  No signs and symptoms to suggest infection.    - treating hypoglycemia as above - monitor vitals per protocol  ?Irregular heart beat:  Patient reports having had an irregular heart beat in the past.  She is NSR at present and EKG is pending. - monitor on telemetry - continue Coreg  Diabetes type 2 with GU neuropathy:  She has had chronic indwelling foley catheter for 2 years.  Foley is changed monthly by RN/aide who visits her home.  She denies dysuria, hematuria but has noticed increased frequency likely 2/2 to increased Lasix dose.  Likely on too high dose of Lantus given her worsening renal function.   - hold Lantus for now - once ready to resume insulin, adjust basal dose based on TDD (40kg x 0.3); she likely requires 6 units or less of basal insulin daily in the setting of progressive CKD.  - follow-up with PCP as an outpatient  Diet:  Carb modified VTE ppx:  Heparin Milam Code:  Full  Dispo: Disposition is deferred at this time, awaiting improvement of current medical problems. Anticipated discharge in approximately 1-2 day(s).   The patient does have a current PCP Karren Burly Artelia Laroche, MD) and does not need an [facility] hospital follow-up appointment after discharge.  The patient does not know have transportation limitations that hinder transportation to clinic appointments.  Signed: Evelena Peat, DO 08/19/2013, 9:09 PM

## 2013-08-19 NOTE — ED Provider Notes (Signed)
CSN: [id number]     Arrival date & time 08/19/13  1423 History   First MD Initiated Contact with Patient 08/19/13 1448     Chief Complaint  Patient presents with  . Hypoglycemia  . Loss of Consciousness     (Consider location/radiation/quality/duration/timing/severity/associated sxs/prior Treatment) HPI Comments: Patient is a 68 year old female past medical history of CKD, diabetes, coronary artery disease, chronic Foley catheter, presents emergency department chief complaint of hypoglycemia since today, EMS reports shows CBG 44 on arrival. The patient reports her blood sugars were 300 prior to the administration of 4 units of NovoLog at approximately 10:00 to 11:00, she reports eating cereal and a piece of cherry pie after insulin dose.  States "I think I gave my self too much".  She reports systems of hypoglycemia persisted. She reports she was sitting while waiting for her friend to arrive when she passed out. Unwitnessed LOC, unknown time, patient approximates 15 minutes.  Patient reports friend found her slumped over in a chair. And her CBG was 44 on arrival.  Denies hypoglycemic symptoms at this time.  Normal insulin: Lantus 15 units QH, Novolog SSI. Foley catheter for 2 years, changes monthly.  PCP: [name], MD   Patient is a 68 y.o. female presenting with hypoglycemia and syncope. The history is provided by the patient and the EMS personnel. No language interpreter was used.  Hypoglycemia Associated symptoms: syncope   Associated symptoms: no shortness of breath and no vomiting   Loss of Consciousness Associated symptoms: headaches   Associated symptoms: no chest pain, no fever, no nausea, no palpitations, no shortness of breath, no vomiting and no weakness     Past Medical History  Diagnosis Date  . Hyperlipidemia   . Diabetes mellitus without complication     TAKES INSULIN   . Urinary retention     DX NEUROGENIC BLADDER--HAS INDWELLING FOLEY CATHETER  . Arthritis    . Anemia   . CAD (coronary artery disease)   . Neuropathic pain   . Shortness of breath     WITH EXERTION  . Stroke     patient not aware.  . Foley catheter in place     for urinary retention  . Critical lower limb ischemia   . Hypertension   . Peripheral vascular disease   . Chronic kidney disease     PT TOLD RECENT TESTING SHOWS KIDNEY DAMAGE--IS SEEING NEPHROLOGIST.  OFFICE NOTES OBTAINED DR. MATTINGLY -"CHRONIC KIDNEY DISEASE 4 MOST LIKELY SECONDARY TO DIABETES"   Past Surgical History  Procedure Laterality Date  . No previous surgery    . Insertion of suprapubic catheter N/A 05/16/2012    Procedure: INSERTION OF SUPRAPUBIC CATHETER;  Surgeon: Sebastian Ache, MD;  Location: [facility];  Service: Urology;  Laterality: N/A;  . Bascilic vein transposition Left 10/30/2012    Procedure: LEFT 1ST STAGE BASCILIC VEIN TRANSPOSITION;  Surgeon: Chuck Hint, MD;  Location: [facility] OR;  Service: Vascular;  Laterality: Left;  . Nm myocar perf wall motion  04/19/2012    low risk study-EF 44%,mild inferolateral hypkinesis  . Bascilic vein transposition Left 02/05/2013    Procedure: BASCILIC VEIN TRANSPOSITION 2ND STAGE;  Surgeon: Chuck Hint, MD;  Location: [facility] OR;  Service: Vascular;  Laterality: Left;   Family History  Problem Relation Age of Onset  . Hyperlipidemia Mother   . Hypertension Mother   . Hodgkin's lymphoma Father   . Heart disease Sister    History  Substance Use Topics  . Smoking  status: Never Smoker   . Smokeless tobacco: Never Used  . Alcohol Use: No     Comment: 1 OR 2 BEER A WEEK   OB History   Grav Para Term Preterm Abortions TAB SAB Ect Mult Living                 Review of Systems  Constitutional: Negative for fever.  Respiratory: Negative for shortness of breath.   Cardiovascular: Positive for syncope. Negative for chest pain and palpitations.  Gastrointestinal: Negative for nausea, vomiting and abdominal pain.  Musculoskeletal: Negative for  myalgias.  Skin: Negative for wound.  Neurological: Positive for syncope, light-headedness and headaches. Negative for weakness and numbness.      Allergies  Review of patient's allergies indicates no known allergies.  Home Medications   Prior to Admission medications   Medication Sig Start Date End Date Taking? Authorizing Provider  aspirin EC 81 MG tablet Take 81 mg by mouth daily.    Historical Provider, MD  carvedilol (COREG) 6.25 MG tablet take 1 tablet by mouth twice a day 03/29/13   Chrystie Nose, MD  furosemide (LASIX) 40 MG tablet Take 20-40 mg by mouth 2 (two) times daily. 1 tab (40 mg)  in the morning 20 mg at bedtime    Historical Provider, MD  insulin aspart (NOVOLOG) 100 UNIT/ML injection Inject 1-6 Units into the skin 3 (three) times daily before meals. 150-200, give 1 unit, 201-250, give 2 units 251-300, give 4 units and greater than 300 give 6 units plus meal coverage/ po (carb) intake would be covered with Novolog based on her servings of carb intake, which based on her weight is approx 1 unit for every 1 serving (15 grams) carbohydrate 04/26/12   Joseph Art, DO  insulin glargine (LANTUS) 100 UNIT/ML injection Inject 15 Units into the skin at bedtime.  04/26/12   Joseph Art, DO  pravastatin (PRAVACHOL) 20 MG tablet Take 20 mg by mouth at bedtime.     Historical Provider, MD  promethazine (PHENERGAN) 25 MG tablet Take 25 mg by mouth every 6 (six) hours as needed for nausea or vomiting.    Historical Provider, MD  traMADol-acetaminophen (ULTRACET) 37.5-325 MG per tablet Take 1 tablet by mouth every 6 (six) hours as needed for moderate pain. 02/05/13   Samantha J Rhyne, PA-C   BP 161/72  Pulse 78  Temp(Src) 94.7 F (34.8 C) (Rectal)  Resp 18  Ht 5\' 3"  (1.6 m)  Wt 90 lb (40.824 kg)  BMI 15.95 kg/m2  SpO2 100% Physical Exam  Nursing note and vitals reviewed. Constitutional: She is oriented to person, place, and time. She appears well-developed and well-nourished.  No distress.  Elderly female, appears older than stated age.  HENT:  Head: Normocephalic and atraumatic.  Mouth/Throat: No oropharyngeal exudate.  Eyes: EOM are normal. Pupils are equal, round, and reactive to light. Right eye exhibits no discharge. Left eye exhibits no discharge.  Neck: Normal range of motion. Neck supple.  Cardiovascular: Normal rate and regular rhythm.   No lower extremity edema  Pulmonary/Chest: Effort normal and breath sounds normal. No respiratory distress. She has no wheezes. She has no rales. She exhibits no tenderness.  Abdominal: Soft. She exhibits no distension. There is no tenderness. There is no rebound and no guarding.  Genitourinary:  Foley catheter inplace  Musculoskeletal: Normal range of motion. She exhibits no edema and no tenderness.  Neurological: She is alert and oriented to person, place, and time.  Speech is clear and goal oriented, follows commands Cranial nerves III - XII grossly intact, no facial droop Normal strength in upper and lower extremities bilaterally, strong and equal grip strength Sensation normal to light touch Moves all 4 extremities without ataxia, coordination intact Normal finger to nose and rapid alternating movements No pronator drift  Skin: Skin is warm and dry. She is not diaphoretic.  Psychiatric: She has a normal mood and affect. Her behavior is normal.    ED Course  Procedures (including critical care time) Labs Review Results for orders placed during the hospital encounter of 08/19/13  CBC      Result Value Ref Range   WBC 6.3  4.0 - 10.5 K/uL   RBC 4.63  3.87 - 5.11 MIL/uL   Hemoglobin 12.3  12.0 - 15.0 g/dL   HCT 40.939.1  81.136.0 - 91.446.0 %   MCV 84.4  78.0 - 100.0 fL   MCH 26.6  26.0 - 34.0 pg   MCHC 31.5  30.0 - 36.0 g/dL   RDW 78.216.0 (*) 95.611.5 - 21.315.5 %   Platelets 236  150 - 400 K/uL  BASIC METABOLIC PANEL      Result Value Ref Range   Sodium 143  137 - 147 mEq/L   Potassium 3.9  3.7 - 5.3 mEq/L   Chloride 100   96 - 112 mEq/L   CO2 27  19 - 32 mEq/L   Glucose, Bld 78  70 - 99 mg/dL   BUN 086108 (*) 6 - 23 mg/dL   Creatinine, Ser 5.784.22 (*) 0.50 - 1.10 mg/dL   Calcium 9.4  8.4 - 46.910.5 mg/dL   GFR calc non Af Amer 10 (*) >90 mL/min   GFR calc Af Amer 11 (*) >90 mL/min  CK      Result Value Ref Range   Total CK 90  7 - 177 U/L  CBG MONITORING, ED      Result Value Ref Range   Glucose-Capillary 101 (*) 70 - 99 mg/dL  CBG MONITORING, ED      Result Value Ref Range   Glucose-Capillary 127 (*) 70 - 99 mg/dL  I-STAT CHEM 8, ED      Result Value Ref Range   Sodium 140  137 - 147 mEq/L   Potassium 3.7  3.7 - 5.3 mEq/L   Chloride 101  96 - 112 mEq/L   BUN 106 (*) 6 - 23 mg/dL   Creatinine, Ser 6.294.30 (*) 0.50 - 1.10 mg/dL   Glucose, Bld 75  70 - 99 mg/dL   Calcium, Ion 5.281.16  4.131.13 - 1.30 mmol/L   TCO2 27  0 - 100 mmol/L   Hemoglobin 15.3 (*) 12.0 - 15.0 g/dL   HCT 24.445.0  01.036.0 - 27.246.0 %  I-STAT CG4 LACTIC ACID, ED      Result Value Ref Range   Lactic Acid, Venous 1.61  0.5 - 2.2 mmol/L  I-STAT TROPOININ, ED      Result Value Ref Range   Troponin i, poc 0.00  0.00 - 0.08 ng/mL   Comment 3            Dg Chest 2 View  08/19/2013   CLINICAL DATA:  Loss of consciousness.  Hypoglycemia.  EXAM: CHEST  2 VIEW  COMPARISON:  03/28/2012  FINDINGS: The cardiac silhouette, mediastinal and hilar contours are within normal limits and stable. There is tortuosity and calcification of the thoracic aorta. The lungs are clear. Mild hyperinflation. No infiltrates, edema or  effusions. The bony thorax is intact.  IMPRESSION: Mild hyperinflation but no acute pulmonary findings.   Electronically Signed   By: Loralie Champagne M.D.   On: 08/19/2013 16:22   Ct Head Wo Contrast  08/19/2013   CLINICAL DATA:  Headache and dizziness  EXAM: CT HEAD WITHOUT CONTRAST  TECHNIQUE: Contiguous axial images were obtained from the base of the skull through the vertex without intravenous contrast.  COMPARISON:  None.  FINDINGS: Sinuses/Soft  tissues: Mucosal thickening of the left sphenoid sinus is mild. The frontal sinuses are hypoplastic.  Intracranial: No mass lesion, hemorrhage, hydrocephalus, acute infarct, intra-axial, or extra-axial fluid collection.  IMPRESSION: 1.  No acute intracranial abnormality. 2. Minimal sinus disease.   Electronically Signed   By: Jeronimo Greaves M.D.   On: 08/19/2013 16:49    Date: 08/19/2013  Rate: 76   Rhythm: normal sinus rhythm  QRS Axis: normal  Intervals: normal  ST/T Wave abnormalities:   Conduction Disutrbances:none  Narrative Interpretation: LVH, Anterior infarct, old.  Old EKG Reviewed:     MDM   Final diagnoses:  Loss of consciousness  Hypoglycemia  Hypothermia, initial encounter  Acute kidney injury   Patient presents after a unknown time of loss consciousness, likely due to CBG of 44, initial temperature 97.4 rectally. CBG on arrival 101. Bear hugger ordered.  Patient without complaints in the ED, no neurologic deficits on exam.. CMP shows acute kidney injury.  Temperature remains at 94.8, AC turned off in room. Reevaluation patient eating in room.  Denies any symptoms at this time. Discussed need for admission. Patient agrees. Discussed patient history, condition with hospitalist who agrees to admit the patient.  Meds given in ED:  Medications  0.9 %  sodium chloride infusion ( Intravenous Transfusing/Transfer 08/19/13 1803)    New Prescriptions   No medications on file        Clabe Seal, PA-C 08/19/13 2115

## 2013-08-19 NOTE — ED Notes (Signed)
Diet tray ordered 

## 2013-08-20 ENCOUNTER — Encounter (HOSPITAL_COMMUNITY): Payer: Self-pay | Admitting: General Practice

## 2013-08-20 DIAGNOSIS — R55 Syncope and collapse: Secondary | ICD-10-CM | POA: Diagnosis not present

## 2013-08-20 DIAGNOSIS — E1169 Type 2 diabetes mellitus with other specified complication: Secondary | ICD-10-CM

## 2013-08-20 DIAGNOSIS — E1149 Type 2 diabetes mellitus with other diabetic neurological complication: Secondary | ICD-10-CM

## 2013-08-20 DIAGNOSIS — E1142 Type 2 diabetes mellitus with diabetic polyneuropathy: Secondary | ICD-10-CM

## 2013-08-20 DIAGNOSIS — T68XXXA Hypothermia, initial encounter: Secondary | ICD-10-CM

## 2013-08-20 DIAGNOSIS — X31XXXA Exposure to excessive natural cold, initial encounter: Secondary | ICD-10-CM

## 2013-08-20 DIAGNOSIS — N184 Chronic kidney disease, stage 4 (severe): Secondary | ICD-10-CM

## 2013-08-20 DIAGNOSIS — N179 Acute kidney failure, unspecified: Secondary | ICD-10-CM

## 2013-08-20 LAB — BASIC METABOLIC PANEL
BUN: 100 mg/dL — ABNORMAL HIGH (ref 6–23)
CO2: 23 mEq/L (ref 19–32)
Calcium: 8.4 mg/dL (ref 8.4–10.5)
Chloride: 100 mEq/L (ref 96–112)
Creatinine, Ser: 3.76 mg/dL — ABNORMAL HIGH (ref 0.50–1.10)
GFR, EST AFRICAN AMERICAN: 13 mL/min — AB (ref >90)
GFR, EST NON AFRICAN AMERICAN: 11 mL/min — AB (ref >90)
Glucose, Bld: 150 mg/dL — ABNORMAL HIGH (ref 70–99)
POTASSIUM: 3.6 meq/L — AB (ref 3.7–5.3)
Sodium: 140 mEq/L (ref 137–147)

## 2013-08-20 LAB — CREATININE, URINE, RANDOM: CREATININE, URINE: 35.18 mg/dL

## 2013-08-20 LAB — GLUCOSE, CAPILLARY
GLUCOSE-CAPILLARY: 157 mg/dL — AB (ref 70–99)
GLUCOSE-CAPILLARY: 182 mg/dL — AB (ref 70–99)
GLUCOSE-CAPILLARY: 191 mg/dL — AB (ref 70–99)
GLUCOSE-CAPILLARY: 244 mg/dL — AB (ref 70–99)
Glucose-Capillary: 305 mg/dL — ABNORMAL HIGH (ref 70–99)
Glucose-Capillary: 195 mg/dL — ABNORMAL HIGH (ref 70–99)

## 2013-08-20 LAB — CBC
HCT: 35 % — ABNORMAL LOW (ref 36.0–46.0)
Hemoglobin: 10.9 g/dL — ABNORMAL LOW (ref 12.0–15.0)
MCH: 26.1 pg (ref 26.0–34.0)
MCHC: 31.1 g/dL (ref 30.0–36.0)
MCV: 83.7 fL (ref 78.0–100.0)
Platelets: 240 10*3/uL (ref 150–400)
RBC: 4.18 MIL/uL (ref 3.87–5.11)
RDW: 15.6 % — ABNORMAL HIGH (ref 11.5–15.5)
WBC: 7.1 10*3/uL (ref 4.0–10.5)

## 2013-08-20 LAB — TROPONIN I

## 2013-08-20 LAB — UREA NITROGEN, URINE: Urea Nitrogen, Ur: 424 mg/dL

## 2013-08-20 MED ORDER — ACETAMINOPHEN 325 MG PO TABS
650.0000 mg | ORAL_TABLET | Freq: Three times a day (TID) | ORAL | Status: DC | PRN
  Administered 2013-08-20: 650 mg via ORAL
  Filled 2013-08-20: qty 2

## 2013-08-20 MED ORDER — INSULIN GLARGINE 100 UNIT/ML ~~LOC~~ SOLN
7.0000 [IU] | Freq: Every day | SUBCUTANEOUS | Status: DC
Start: 2013-08-20 — End: 2013-10-17

## 2013-08-20 MED ORDER — INSULIN ASPART 100 UNIT/ML ~~LOC~~ SOLN: 2.0000 [IU] | Freq: Three times a day (TID) | SUBCUTANEOUS | Status: DC | PRN

## 2013-08-20 NOTE — Discharge Summary (Signed)
Name: Elizabeth [name] MRN: [id number] DOB: 1945/11/25 68 y.o. PCP: [name], MD  Date of Admission: 08/19/2013  2:23 PM Date of Discharge: 08/20/2013 Attending Physician: Aletta Edouard, MD  Discharge Diagnosis:  Principal Problem:   Loss of consciousness Active Problems:   Hypothermia   Diabetes type 2, controlled   End stage renal disease   Hypoglycemia   Acute on chronic kidney failure   Azotemia  Discharge Medications:   Medication List         aspirin EC 81 MG tablet  Take 81 mg by mouth daily.     carvedilol 6.25 MG tablet  Commonly known as:  COREG  take 1 tablet by mouth twice a day              insulin aspart 100 UNIT/ML injection  Commonly known as:  novoLOG  Inject 2 Units into the skin 3 (three) times daily with meals as needed for high blood sugar.     insulin glargine 100 UNIT/ML injection  Commonly known as:  LANTUS  Inject 0.07 mLs (7 Units total) into the skin at bedtime.     pravastatin 20 MG tablet  Commonly known as:  PRAVACHOL  Take 20 mg by mouth at bedtime.        Disposition and follow-up:   Elizabeth [name] was discharged from [facility] in Good condition.  At the hospital follow up visit please address:  1.  Pt to follow up with Dr. Briant Cedar. Declining renal function. Patient will be contacted by her nephrologist for follow up appointment.  2.  Labs / imaging needed at time of follow-up: Bmet.  3.  Pending labs/ test needing follow-up: None  Follow-up Appointments:     Follow-up Information   Follow up with [name], MD. Call in 1 week. (please follow up with pcp in 1 week)    Specialty:  Specialist   Contact information:   [address] [place] [address] [phone]       Discharge Instructions: Discharge Instructions   Diet - low sodium heart healthy    Complete by:  As directed      Discharge instructions    Complete by:  As directed   We have reduced the dose of  insulin you are taking. Please this very important.  1- We have reduced your lantus to 7u once a day.  2- Take only 2u of short acting insulin ONLY if blood sugar is greater than 200, and you are about to eat a meal.  It is very important you follow up with your PCP in one week with your glucometer, so that adjustments can be made to your insulin regimen.     Increase activity slowly    Complete by:  As directed            Consultations:  None  Procedures Performed:  Dg Chest 2 View  08/19/2013   CLINICAL DATA:  Loss of consciousness.  Hypoglycemia.  EXAM: CHEST  2 VIEW  COMPARISON:  03/28/2012  FINDINGS: The cardiac silhouette, mediastinal and hilar contours are within normal limits and stable. There is tortuosity and calcification of the thoracic aorta. The lungs are clear. Mild hyperinflation. No infiltrates, edema or effusions. The bony thorax is intact.  IMPRESSION: Mild hyperinflation but no acute pulmonary findings.   Electronically Signed   By: Loralie Champagne M.D.   On: 08/19/2013 16:22   Ct Head Wo Contrast  08/19/2013   CLINICAL DATA:  Headache and dizziness  EXAM: CT HEAD WITHOUT CONTRAST  TECHNIQUE: Contiguous axial images were obtained from the base of the skull through the vertex without intravenous contrast.  COMPARISON:  None.  FINDINGS: Sinuses/Soft tissues: Mucosal thickening of the left sphenoid sinus is mild. The frontal sinuses are hypoplastic.  Intracranial: No mass lesion, hemorrhage, hydrocephalus, acute infarct, intra-axial, or extra-axial fluid collection.  IMPRESSION: 1.  No acute intracranial abnormality. 2. Minimal sinus disease.   Electronically Signed   By: Jeronimo Greaves M.D.   On: 08/19/2013 16:49   2D Echo: None  Cardiac Cath: None  Admission HPI: Chief Complaint: syncope   History of Present Illness: Elizabeth [name] is a 68 year old woman with a PMH of DM type 2 (Hgb A1c 7.25 May 2012), CKD4 (followed by Dr. Briant Cedar, fistula in place), HLD and anemia of  chronic disease who presents from home via EMS after syncopal episode in the setting of hypoglycemia. She reports a CBG of > 300 upon waking around 9AM this morning. She took 4 units of insulin to cover and proceeded with her morning but did not eat breakfast. Around 11AM she began to feel as if her BG was low. She denies diaphoresis or hunger, but felt it was low so she drank some ginger ale (< 8oz.). She continued to feel as if her BG was low. At that time her ride was on his way to pick her up for a lab appointment so she ate a few bites of pie in an attempt to raise her BG. By the time her ride arrived she says she was "incoherent." She understood he was asking her questions but she could not respond to him. She says she lost consciousness for a moment. When she woke EMS was at her home. She says a similar incident happened about three months ago. She denies recent illness, chest pain, dyspnea, weakness or headache. She has chronic N/V for which she has been prescribed an anti-emetic by her PCP. She is not nauseous presently. She had a few days of diarrhea last week that has resolved with imodium. She has had a chronic indwelling foley for about 2 years. A nurse comes to her home to change it monthly. She denies dysuria. She says her Lasix was increased from 40mg  BID to 80mg  BID last week. She usually only takes it about 4 days out of the week. Though her there has been no change in her appetite, she generally does not eat regularly scheduled meals. She tends to eat her largest meal at night and does not adhere to a diet. She had chicken and a hamburger for dinner last night, followed by strawberry cobbler (made with Splenda) for dessert. CBG was in the 200s prior to taking Lantus 15 units at bedtime.  In the ED: T 94.9F (rectal), HR 77, BP 132-169/49-39mmHg, RR 12, SpO2 100%, CBG 101; a Bear Hugger was placed for warming.  Hospital Course by problem list:  Syncope 2/2 to hypoglycemia: EMS reported blood  sugars- 40s, maintained blood sugars throughout admission >100 on carb modified diet. Hypoglycemia- likely due to Taking insulin without meals and insulin stacking in the setting of advanced CKD. Head Ct without acute abnormality. ACS ruled out with Trops x3 neg, and EKG not suggestive X2. Insulin was held on admission. Initial insulin regimen- Lantus-14u and SSI was changed- to Lantus 7u on discharge and to take 2u if blood sugars >200 AND pt is about to eat. Pt to follow up with PCP, we  tried to arrange this for pt, but was told pt will be called and this will be arranged by the office.    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 Baseline Cr 1.9-2.2. BUN:Cr > 20:1 consistent with pre-renal etiology likely 2/2 to recent increase in Lasix dose. Pt follows with Dr. Briant Cedar. Pt to have follow up with nephrologist on the day of admission. Called to reschedule a close follow up appointment for pt, but office said this will be arrange with pt. She has a fistula in place. Lasix held on admission. Cr trended down to 3.76 by discharge. Pt to follow up with nephrologist on discharge.  Hypothermia: Initial T 94.34F. Likely 2/2 to hypoglycemia as temperature normalized with treatment of low blood glucose. No signs and symptoms to suggest infection.   Diabetes type 2 with GU neuropathy: She has had chronic indwelling foley catheter for 2 years. Foley is changed monthly by RN/aide who visits her home. No signs of UTI. Follow up  With PCP.  Discharge Vitals:   BP 140/62  Pulse 82  Temp(Src) 98.2 F (36.8 C) (Oral)  Resp 18  Ht 5\' 3"  (1.6 m)  Wt 89 lb 15.2 oz (40.8 kg)  BMI 15.94 kg/m2  SpO2 98%  Discharge Labs:  Results for orders placed during the hospital encounter of 08/19/13 (from the past 24 hour(s))  I-STAT TROPOININ, ED     Status: None   Collection Time    08/19/13  4:48 PM      Result Value Ref Range   Troponin i, poc 0.00  0.00 - 0.08 ng/mL   Comment 3           CBG  MONITORING, ED     Status: Abnormal   Collection Time    08/19/13  5:03 PM      Result Value Ref Range   Glucose-Capillary 127 (*) 70 - 99 mg/dL  GLUCOSE, CAPILLARY     Status: Abnormal   Collection Time    08/19/13  6:34 PM      Result Value Ref Range   Glucose-Capillary 241 (*) 70 - 99 mg/dL  GLUCOSE, CAPILLARY     Status: Abnormal   Collection Time    08/19/13  9:10 PM      Result Value Ref Range   Glucose-Capillary 222 (*) 70 - 99 mg/dL  HEPATIC FUNCTION PANEL     Status: Abnormal   Collection Time    08/19/13 10:13 PM      Result Value Ref Range   Total Protein 6.9  6.0 - 8.3 g/dL   Albumin 2.7 (*) 3.5 - 5.2 g/dL   AST 24  0 - 37 U/L   ALT 21  0 - 35 U/L   Alkaline Phosphatase 92  39 - 117 U/L   Total Bilirubin <0.2 (*) 0.3 - 1.2 mg/dL   Bilirubin, Direct <1.6  0.0 - 0.3 mg/dL   Indirect Bilirubin NOT CALCULATED  0.3 - 0.9 mg/dL  TSH     Status: None   Collection Time    08/19/13 10:13 PM      Result Value Ref Range   TSH 1.160  0.350 - 4.500 uIU/mL  TROPONIN I     Status: None   Collection Time    08/19/13 10:13 PM      Result Value Ref Range   Troponin I <0.30  <0.30 ng/mL  GLUCOSE, CAPILLARY     Status: Abnormal   Collection Time  08/19/13 10:49 PM      Result Value Ref Range   Glucose-Capillary 308 (*) 70 - 99 mg/dL  URINALYSIS, ROUTINE W REFLEX MICROSCOPIC     Status: Abnormal   Collection Time    08/19/13 11:05 PM      Result Value Ref Range   Color, Urine YELLOW  YELLOW   APPearance CLOUDY (*) CLEAR   Specific Gravity, Urine 1.012  1.005 - 1.030   pH 7.0  5.0 - 8.0   Glucose, UA 250 (*) NEGATIVE mg/dL   Hgb urine dipstick SMALL (*) NEGATIVE   Bilirubin Urine NEGATIVE  NEGATIVE   Ketones, ur NEGATIVE  NEGATIVE mg/dL   Protein, ur 161100 (*) NEGATIVE mg/dL   Urobilinogen, UA 0.2  0.0 - 1.0 mg/dL   Nitrite NEGATIVE  NEGATIVE   Leukocytes, UA MODERATE (*) NEGATIVE  URINE RAPID DRUG SCREEN (HOSP PERFORMED)     Status: None   Collection Time     08/19/13 11:05 PM      Result Value Ref Range   Opiates NONE DETECTED  NONE DETECTED   Cocaine NONE DETECTED  NONE DETECTED   Benzodiazepines NONE DETECTED  NONE DETECTED   Amphetamines NONE DETECTED  NONE DETECTED   Tetrahydrocannabinol NONE DETECTED  NONE DETECTED   Barbiturates NONE DETECTED  NONE DETECTED  CREATININE, URINE, RANDOM     Status: None   Collection Time    08/19/13 11:05 PM      Result Value Ref Range   Creatinine, Urine 35.18    URINE MICROSCOPIC-ADD ON     Status: None   Collection Time    08/19/13 11:05 PM      Result Value Ref Range   Squamous Epithelial / LPF RARE  RARE   WBC, UA 21-50  <3 WBC/hpf   RBC / HPF 0-2  <3 RBC/hpf   Bacteria, UA RARE  RARE  GLUCOSE, CAPILLARY     Status: Abnormal   Collection Time    08/20/13 12:05 AM      Result Value Ref Range   Glucose-Capillary 244 (*) 70 - 99 mg/dL  GLUCOSE, CAPILLARY     Status: Abnormal   Collection Time    08/20/13  1:56 AM      Result Value Ref Range   Glucose-Capillary 191 (*) 70 - 99 mg/dL  GLUCOSE, CAPILLARY     Status: Abnormal   Collection Time    08/20/13  4:06 AM      Result Value Ref Range   Glucose-Capillary 182 (*) 70 - 99 mg/dL  BASIC METABOLIC PANEL     Status: Abnormal   Collection Time    08/20/13  5:49 AM      Result Value Ref Range   Sodium 140  137 - 147 mEq/L   Potassium 3.6 (*) 3.7 - 5.3 mEq/L   Chloride 100  96 - 112 mEq/L   CO2 23  19 - 32 mEq/L   Glucose, Bld 150 (*) 70 - 99 mg/dL   BUN 096100 (*) 6 - 23 mg/dL   Creatinine, Ser 0.453.76 (*) 0.50 - 1.10 mg/dL   Calcium 8.4  8.4 - 40.910.5 mg/dL   GFR calc non Af Amer 11 (*) >90 mL/min   GFR calc Af Amer 13 (*) >90 mL/min  CBC     Status: Abnormal   Collection Time    08/20/13  5:49 AM      Result Value Ref Range   WBC 7.1  4.0 - 10.5 K/uL  RBC 4.18  3.87 - 5.11 MIL/uL   Hemoglobin 10.9 (*) 12.0 - 15.0 g/dL   HCT 16.1 (*) 09.6 - 04.5 %   MCV 83.7  78.0 - 100.0 fL   MCH 26.1  26.0 - 34.0 pg   MCHC 31.1  30.0 - 36.0 g/dL    RDW 40.9 (*) 81.1 - 15.5 %   Platelets 240  150 - 400 K/uL  TROPONIN I     Status: None   Collection Time    08/20/13  5:49 AM      Result Value Ref Range   Troponin I <0.30  <0.30 ng/mL  GLUCOSE, CAPILLARY     Status: Abnormal   Collection Time    08/20/13  7:46 AM      Result Value Ref Range   Glucose-Capillary 157 (*) 70 - 99 mg/dL  GLUCOSE, CAPILLARY     Status: Abnormal   Collection Time    08/20/13 11:40 AM      Result Value Ref Range   Glucose-Capillary 305 (*) 70 - 99 mg/dL  TROPONIN I     Status: None   Collection Time    08/20/13 11:46 AM      Result Value Ref Range   Troponin I <0.30  <0.30 ng/mL    Signed: Kennis Carina, MD 08/20/2013, 3:53 PM   Services Ordered on Discharge: None. Equipment Ordered on Discharge: None.

## 2013-08-20 NOTE — Progress Notes (Signed)
Inpatient Diabetes Program Recommendations  AACE/ADA: New Consensus Statement on Inpatient Glycemic Control (2013)  Target Ranges:  Prepandial:   less than 140 mg/dL      Peak postprandial:   less than 180 mg/dL (1-2 hours)      Critically ill patients:  140 - 180 mg/dL      Results for MARSHAL, STOOS (MRN [id number]) as of 08/20/2013 09:00  Ref. Range 08/20/2013 00:05 08/20/2013 01:56 08/20/2013 04:06 08/20/2013 07:46  Glucose-Capillary Latest Range: 70-99 mg/dL 591 (H) 638 (H) 466 (H) 157 (H)     Admitted with Syncope secondary to Hypoglycemia.  History of DM, CKD 4  Home DM Meds: Lantus 15 units QHS + Novolog 1-6 units tid per SSI + Carbohydrate Coverage (1 unit for every 15 grams CHO)   No insulin orders placed as of yet.     MD- Please consider the following:  1. Start 1/2 home dose of Lantus- Lantus 8 units QHS 2. Start Novolog Sensitive SSI 3. May need reduction of home insulin doses given kidney disease    Will follow Ambrose Finland RN, MSN, CDE Diabetes Coordinator Inpatient Diabetes Program Team Pager: [phone] (8a-10p)

## 2013-08-20 NOTE — Progress Notes (Signed)
Subjective: No complaints today. Ready to go home. No dizziness. Says she didn't eat like she was supposed to after taking the insulin. Pt is on a sliding scale. She also took her long acting insulin yesterday.  Objective: Vital signs in last 24 hours: Filed Vitals:   08/20/13 0205 08/20/13 0413 08/20/13 0500 08/20/13 1000  BP:  142/79  140/62  Pulse:  89  82  Temp: 98.4 F (36.9 C) 98.7 F (37.1 C)  98.2 F (36.8 C)  TempSrc: Oral Oral  Oral  Resp:  16  18  Height:      Weight:   89 lb 15.2 oz (40.8 kg)   SpO2:  97%  98%   Weight change:   Intake/Output Summary (Last 24 hours) at 08/20/13 1410 Last data filed at 08/20/13 1010  Gross per 24 hour  Intake    483 ml  Output      0 ml  Net    483 ml   General appearance: alert, cooperative and no distress Head: Normocephalic, without obvious abnormality, atraumatic Lungs: clear to auscultation bilaterally Heart: regular rate and rhythm, S1, S2 normal, no murmur, click, rub or gallop Abdomen: soft, non-tender; bowel sounds normal; no masses,  no organomegaly Extremities: extremities normal, atraumatic, no cyanosis or edema Pulses: 2+ and symmetric Neurologic: Alert and oriented X3, normal strenght in all extremities.  Lab Results: Basic Metabolic Panel:  Recent Labs Lab 08/19/13 1447 08/19/13 1456 08/20/13 0549  NA 143 140 140  K 3.9 3.7 3.6*  CL 100 101 100  CO2 27  --  23  GLUCOSE 78 75 150*  BUN 108* 106* 100*  CREATININE 4.22* 4.30* 3.76*  CALCIUM 9.4  --  8.4   Liver Function Tests:  Recent Labs Lab 08/19/13 2213  AST 24  ALT 21  ALKPHOS 92  BILITOT <0.2*  PROT 6.9  ALBUMIN 2.7*   No results found for this basename: LIPASE, AMYLASE,  in the last 168 hours No results found for this basename: AMMONIA,  in the last 168 hours CBC:  Recent Labs Lab 08/19/13 1447 08/19/13 1456 08/20/13 0549  WBC 6.3  --  7.1  HGB 12.3 15.3* 10.9*  HCT 39.1 45.0 35.0*  MCV 84.4  --  83.7  PLT 236  --  240    Cardiac Enzymes:  Recent Labs Lab 08/19/13 1531 08/19/13 2213 08/20/13 0549 08/20/13 1146  CKTOTAL 90  --   --   --   TROPONINI  --  <0.30 <0.30 <0.30   CBG:  Recent Labs Lab 08/19/13 2249 08/20/13 0005 08/20/13 0156 08/20/13 0406 08/20/13 0746 08/20/13 1140  GLUCAP 308* 244* 191* 182* 157* 305*   Thyroid Function Tests:  Recent Labs Lab 08/19/13 2213  TSH 1.160   Urine Drug Screen: Drugs of Abuse     Component Value Date/Time   LABOPIA NONE DETECTED 08/19/2013 2305   COCAINSCRNUR NONE DETECTED 08/19/2013 2305   LABBENZ NONE DETECTED 08/19/2013 2305   AMPHETMU NONE DETECTED 08/19/2013 2305   THCU NONE DETECTED 08/19/2013 2305   LABBARB NONE DETECTED 08/19/2013 2305    Alcohol Level: No results found for this basename: ETH,  in the last 168 hours Urinalysis:  Recent Labs Lab 08/19/13 2305  COLORURINE YELLOW  LABSPEC 1.012  PHURINE 7.0  GLUCOSEU 250*  HGBUR SMALL*  BILIRUBINUR NEGATIVE  KETONESUR NEGATIVE  PROTEINUR 100*  UROBILINOGEN 0.2  NITRITE NEGATIVE  LEUKOCYTESUR MODERATE*   Micro Results: No results found for this or any previous  visit (from the past 240 hour(s)). Studies/Results: Dg Chest 2 View  08/19/2013   CLINICAL DATA:  Loss of consciousness.  Hypoglycemia.  EXAM: CHEST  2 VIEW  COMPARISON:  03/28/2012  FINDINGS: The cardiac silhouette, mediastinal and hilar contours are within normal limits and stable. There is tortuosity and calcification of the thoracic aorta. The lungs are clear. Mild hyperinflation. No infiltrates, edema or effusions. The bony thorax is intact.  IMPRESSION: Mild hyperinflation but no acute pulmonary findings.   Electronically Signed   By: Loralie ChampagneMark  Gallerani M.D.   On: 08/19/2013 16:22   Ct Head Wo Contrast  08/19/2013   CLINICAL DATA:  Headache and dizziness  EXAM: CT HEAD WITHOUT CONTRAST  TECHNIQUE: Contiguous axial images were obtained from the base of the skull through the vertex without intravenous contrast.   COMPARISON:  None.  FINDINGS: Sinuses/Soft tissues: Mucosal thickening of the left sphenoid sinus is mild. The frontal sinuses are hypoplastic.  Intracranial: No mass lesion, hemorrhage, hydrocephalus, acute infarct, intra-axial, or extra-axial fluid collection.  IMPRESSION: 1.  No acute intracranial abnormality. 2. Minimal sinus disease.   Electronically Signed   By: Jeronimo GreavesKyle  Talbot M.D.   On: 08/19/2013 16:49   Medications: I have reviewed the patient's current medications. Scheduled Meds: . aspirin EC  81 mg Oral Daily  . carvedilol  6.25 mg Oral BID WC  . heparin  5,000 Units Subcutaneous 3 times per day  . sodium chloride  3 mL Intravenous Q12H  . sodium chloride  3 mL Intravenous Q12H   Continuous Infusions:  PRN Meds:.sodium chloride, acetaminophen, sodium chloride  Assessment/Plan:  Syncope 2/2 to hypoglycemia:  Aetiology due to taking insulin without meals and possibly CKD-5 with insulin stacking. CT head negative for acute intracranial abnormalities. No concern for cardiac etiology, trops x 2 negative and EKG not suggestive. CBGs in the Ed- 101. All hypoglycemic agents were held on admission. Patient was started on a diet with good response of blood sugars. Pt was discharged home to take 7u daily of lantus and to take 2u of novolog only with meals and if blood glucose is greater than 200.  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 Baseline Cr 1.9-2.2. BUN:Cr > 20:1 consistent with pre-renal etiology likely 2/2 to recent increase in Lasix dose. She is followed by Dr. Briant CedarMattingly. Has been CKD4 in the past, now appears to be in CKD stage 5- ESRD. Held home lasix 80mg  on admission.    Hypothermia: Initial T 94.38F. Likely 2/2 to hypoglycemia since temperature has normalized with treatment of low blood glucose. No signs and symptoms to suggest infection. Temperature now normal- 98.28F on discharge.   Diabetes type 2 with GU neuropathy: She has had chronic indwelling foley  catheter for 2 years. Foley is changed monthly by RN/aide who visits her home. Was changed yesterday on admission. Last HgbA1c- 04/24/2013- 7.4. New insulin regimen as above.   Dispo: Disposition is deferred at this time, awaiting improvement of current medical problems.     The patient does have a current PCP Karren Burly(Dwight Artelia LarocheM Williams, MD) and [facility] after discharge.  The patient does have transportation limitations that hinder transportation to clinic appointments.  .Services Needed at time of discharge: Y = Yes, Blank = No PT:   OT:   RN:   Equipment:   Other:     LOS: 1 day   Kennis CarinaEjiroghene Emokpae, MD 08/20/2013, 2:10 PM

## 2013-08-20 NOTE — H&P (Signed)
INTERNAL MEDICINE TEACHING ATTENDING NOTE  Day 1 of stay  Patient name: Elizabeth [name]  MRN: [id number] Date of birth: Sep 20, 1945   67 y.o. african [place] lady with DM2, CKD4 admitted with syncope secondary to hypoglycemia. I have met and evaluated the lady this morning. She is recovered, alert and oriented, clinically stable and her blood sugars are much improved. She has no new complaints.    Recent Labs Lab 08/20/13 0005 08/20/13 0156 08/20/13 0406 08/20/13 0746 08/20/13 1140  GLUCAP 244* 191* 182* 157* 305*    Recent Labs Lab 08/19/13 1447 08/19/13 1456 08/20/13 0549  NA 143 140 140  K 3.9 3.7 3.6*  CL 100 101 100  CO2 27  --  23  GLUCOSE 78 75 150*  BUN 108* 106* 100*  CREATININE 4.22* 4.30* 3.76*  CALCIUM 9.4  --  8.4    Assessment and Plan   I have read the note by Dr Redmond Pulling and I agree with her documentation and plan. In addition:   I do think this syncope was secondary to hypoglycemia in the setting of advance CKD (possibly due to poor insulin clearance), and her poor eating habits not consistent with the requirements of her disease. Cardiac, neurogenic or other reasons for this are unlikely. Elizabeth [name] understands that she needs to follow up with her PCP and nephrologist soon. We will arrange these follow ups for her as possible. She will be discharged on a reduced dose of insulin and decreased dose of lasix which can be reassessed at her nephrology appointment. She understands that she needs to maintain a goal of eating 3 meals a day.   I have seen and evaluated this patient and discussed it with my IM resident team.  Please see the rest of the plan per resident note from today.   [place], [place] 08/20/2013, 12:41 PM.

## 2013-08-20 NOTE — Progress Notes (Signed)
UR completed 

## 2013-08-22 NOTE — ED Provider Notes (Signed)
Medical screening examination/treatment/procedure(s) were performed by non-physician practitioner and as supervising physician I was immediately available for consultation/collaboration.   EKG Interpretation   Date/Time:  Monday August 19 2013 14:41:03 EDT Ventricular Rate:  76 PR Interval:  156 QRS Duration: 91 QT Interval:  442 QTC Calculation: 497 R Axis:   -33 Text Interpretation:  Sinus rhythm Left ventricular hypertrophy Anterior  infarct, old ED PHYSICIAN INTERPRETATION AVAILABLE IN [facility]  Confirmed by TEST, Record ([id number]) on 08/21/2013 8:22:15 AM        Laray Anger, DO 08/22/13 [id number]

## 2013-08-27 NOTE — Discharge Summary (Signed)
INTERNAL MEDICINE ATTENDING DISCHARGE COSIGN   I evaluated the patient on the day of discharge and discussed the discharge plan with my resident team. I agree with the discharge documentation and disposition.   Aletta Edouard 08/27/2013, 8:35 AM

## 2013-08-29 ENCOUNTER — Other Ambulatory Visit (HOSPITAL_COMMUNITY): Payer: Self-pay | Admitting: *Deleted

## 2013-08-30 ENCOUNTER — Encounter (HOSPITAL_COMMUNITY): Payer: Medicare Other | Attending: Nephrology

## 2013-08-30 DIAGNOSIS — N184 Chronic kidney disease, stage 4 (severe): Secondary | ICD-10-CM | POA: Insufficient documentation

## 2013-08-30 DIAGNOSIS — D638 Anemia in other chronic diseases classified elsewhere: Secondary | ICD-10-CM | POA: Insufficient documentation

## 2013-09-04 ENCOUNTER — Ambulatory Visit: Payer: Medicare Other | Admitting: Endocrinology

## 2013-09-16 ENCOUNTER — Other Ambulatory Visit: Payer: Self-pay | Admitting: *Deleted

## 2013-09-16 ENCOUNTER — Encounter: Payer: Self-pay | Admitting: Endocrinology

## 2013-09-16 ENCOUNTER — Ambulatory Visit (INDEPENDENT_AMBULATORY_CARE_PROVIDER_SITE_OTHER): Payer: Medicare Other | Admitting: Endocrinology

## 2013-09-16 VITALS — BP 86/51 | HR 87 | Temp 98.0°F | Resp 14 | Ht 64.0 in | Wt 83.6 lb

## 2013-09-16 DIAGNOSIS — E119 Type 2 diabetes mellitus without complications: Secondary | ICD-10-CM

## 2013-09-16 DIAGNOSIS — N186 End stage renal disease: Secondary | ICD-10-CM

## 2013-09-16 DIAGNOSIS — Z8639 Personal history of other endocrine, nutritional and metabolic disease: Secondary | ICD-10-CM

## 2013-09-16 DIAGNOSIS — E1159 Type 2 diabetes mellitus with other circulatory complications: Secondary | ICD-10-CM

## 2013-09-16 DIAGNOSIS — Z862 Personal history of diseases of the blood and blood-forming organs and certain disorders involving the immune mechanism: Secondary | ICD-10-CM

## 2013-09-16 DIAGNOSIS — Z8631 Personal history of diabetic foot ulcer: Secondary | ICD-10-CM

## 2013-09-16 DIAGNOSIS — E1142 Type 2 diabetes mellitus with diabetic polyneuropathy: Secondary | ICD-10-CM

## 2013-09-16 LAB — GLUCOSE, POCT (MANUAL RESULT ENTRY): POC GLUCOSE: 371 mg/dL — AB (ref 70–99)

## 2013-09-16 MED ORDER — ONETOUCH DELICA LANCETS FINE MISC: Status: DC

## 2013-09-16 MED ORDER — GLUCOSE BLOOD VI STRP: ORAL_STRIP | Status: DC

## 2013-09-16 NOTE — Patient Instructions (Addendum)
Insulin 10 Lantus daily  Novolog 3 units with each meal and extra insulin as follows:  200-249 take 1 unit more  250-299 take 2 more and > 300 take 3 more  Must eat within 15 min of Novolog  Take sugar 4 x daily

## 2013-09-16 NOTE — Progress Notes (Signed)
Patient ID: Elizabeth MacleodBetty Mazon, female   DOB: 10/22/1945, 68 y.o.   MRN: [id number]    Reason for Appointment: Consultation for Diabetes  Referring physician: Lerry Linerwight Williams  History of Present Illness:          Diagnosis: Type 2 diabetes mellitus, date of diagnosis: 1996        Past history:  She was initially treated with metformin and glyburide and her detailed history or level of control is not available She does not think she had been treated with other medications. Metformin was stopped at some point because of renal insufficiency She was switched to insulin about 2 years ago She believes she was started on Lantus 2 years ago and has had different regimens of NovoLog since then  Recent history:  She has been taking a regimen of Lantus at night and NovoLog before meals for sometime She was admitted for hypoglycemia in 6/15 and rarely when her blood sugar was about 300 she took 4 units of NovoLog and did not eat which caused her hypoglycemia is at bedtime she was taking 14 units of Lantus a day However since her discharge she has been told to take 7 units of Lantus Also apparently she was under the impression that when she has her dialysis she will not take any Lantus the night before She is taking a fixed dose of NovoLog before meals and does not adjusted based on blood sugar or meal size, usually taking insulin when eating Her blood sugars have been mostly high at home and only occasionally below 200 Because of poor control she has been referred here       INSULIN regimen is described as: Lantus 7 units at bedtime, NovoLog 2 ac tid  Compliance with the medical regimen: Good Hypoglycemia: None since her discharge    Glucose monitoring:  done one time a day         Glucometer: ? Prodigy    Blood Glucose readings are not clearly documented on her records Blood sugars in the morning fluctuate between 145-494, recently higher. Evening readings and 49-384, mostly high  Glycemic control:  Last A1c 12.5%, unknown date, records and depending from PCP   Lab Results  Component Value Date   HGBA1C 7.4* 04/24/2012   Lab Results  Component Value Date   CREATININE 3.76* 08/20/2013    Retinal exam: Most recent: 2007.   Self-care: The diet that the patient has been following is: tries to limit .      Meals: 3 meals per day. Breakfast is cereal  and juice, lunch is a sandwich and ginger ale. Mixed meal at dinner, snacks are sugar-free desserts, fruit or applesauce         Exercise: Minimal           Dietician visit: Most recent: Several years ago.               Weight history: Wt Readings from Last 3 Encounters:  09/16/13 83 lb 9.6 oz (37.921 kg)  08/20/13 89 lb 15.2 oz (40.8 kg)  06/12/13 85 lb (38.556 kg)       Medication List       This list is accurate as of: 09/16/13  3:31 PM.  Always use your most recent med list.               aspirin EC 81 MG tablet  Take 81 mg by mouth daily.     carvedilol 6.25 MG tablet  Commonly known as:  COREG  take 1 tablet by mouth twice a day     insulin aspart 100 UNIT/ML injection  Commonly known as:  novoLOG  Inject 2 Units into the skin 3 (three) times daily with meals as needed for high blood sugar.     insulin glargine 100 UNIT/ML injection  Commonly known as:  LANTUS  Inject 0.07 mLs (7 Units total) into the skin at bedtime.     multivitamin Tabs tablet  Take 1 tablet by mouth daily.     pravastatin 20 MG tablet  Commonly known as:  PRAVACHOL  Take 20 mg by mouth at bedtime.        Allergies: No Known Allergies  Past Medical History  Diagnosis Date  . Hyperlipidemia   . Diabetes mellitus without complication     TAKES INSULIN   . Urinary retention     DX NEUROGENIC BLADDER--HAS INDWELLING FOLEY CATHETER  . Arthritis   . Anemia   . CAD (coronary artery disease)   . Neuropathic pain   . Shortness of breath     WITH EXERTION  . Stroke     patient not aware.  . Foley catheter in place     for urinary  retention  . Critical lower limb ischemia   . Hypertension   . Peripheral vascular disease   . Chronic kidney disease     PT TOLD RECENT TESTING SHOWS KIDNEY DAMAGE--IS SEEING NEPHROLOGIST.  OFFICE NOTES OBTAINED DR. MATTINGLY -"CHRONIC KIDNEY DISEASE 4 MOST LIKELY SECONDARY TO DIABETES"  . Hypoglycemia 08/19/2013  . Hypokalemia 08/19/2013    Past Surgical History  Procedure Laterality Date  . No previous surgery    . Insertion of suprapubic catheter N/A 05/16/2012    Procedure: INSERTION OF SUPRAPUBIC CATHETER;  Surgeon: Sebastian Ache, MD;  Location: [facility] ORS;  Service: Urology;  Laterality: N/A;  . Bascilic vein transposition Left 10/30/2012    Procedure: LEFT 1ST STAGE BASCILIC VEIN TRANSPOSITION;  Surgeon: Chuck Hint, MD;  Location: [facility] OR;  Service: Vascular;  Laterality: Left;  . Nm myocar perf wall motion  04/19/2012    low risk study-EF 44%,mild inferolateral hypkinesis  . Bascilic vein transposition Left 02/05/2013    Procedure: BASCILIC VEIN TRANSPOSITION 2ND STAGE;  Surgeon: Chuck Hint, MD;  Location: [facility] OR;  Service: Vascular;  Laterality: Left;    Family History  Problem Relation Age of Onset  . Hyperlipidemia Mother   . Hypertension Mother   . Hodgkin's lymphoma Father   . Heart disease Sister     Social History:  reports that she has never smoked. She has never used smokeless tobacco. She reports that she does not drink alcohol or use illicit drugs.    Review of Systems    Eyes: She has poor vision for unknown reasons, has not had an eye exam since 2007       Lipids: Previous results not available. Taking pravastatin 20 mg       No results found for this basename: CHOL,  HDL,  LDLCALC,  LDLDIRECT,  TRIG,  CHOLHDL                  Skin: No rash. She has a history of left plantar ulcer treated by podiatrist and followed periodically     Thyroid:  No  unusual fatigue.     The blood pressure has been high previously for several years but  currently only on low-dose carvedilol, followed by nephrologist     She does  not complain of calf pain on walking     She has had end-stage kidney disease and started dialysis 3 weeks ago     No swelling of feet.     No shortness of breath or chest tightness  on exertion.     Bowel habits: Normal.       No frequency of urination or nocturia       No joint  pains.           She does have a history of  Tingling but no Numbness, or burning in hands and some on her feet   LABS:  Office Visit on 09/16/2013  Component Date Value Ref Range Status  . POC Glucose 09/16/2013 371* 70 - 99 mg/dl Final    Physical Examination:  BP 86/51  Pulse 87  Temp(Src) 98 F (36.7 C)  Resp 14  Ht 5\' 4"  (1.626 m)  Wt 83 lb 9.6 oz (37.921 kg)  BMI 14.34 kg/m2  SpO2 98%  GENERAL:         Patient is asthenic looking, alert and cooperative HEENT:         Eye exam shows normal external appearance. Fundus exam shows no retinopathy on the left but right fundus not clearly seen, possibly lenticular opacity present. Oral exam shows normal mucosa .  NECK:         General:  Neck exam shows no lymphadenopathy. Carotids are normal to palpation and no bruit heard.  Thyroid is not enlarged and no nodules felt.   LUNGS:         Chest is symmetrical. Lungs are clear to auscultation.Marland Kitchen   HEART:         Heart sounds:  S1 and S2 are normal. No murmurs or clicks heard., no S3 or S4.   ABDOMEN:   There is no distention present. Liver and spleen are not palpable. No other mass or tenderness present.  EXTREMITIES:     There is trace ankle edema. No skin lesions present.Marland Kitchen  NEUROLOGICAL:   Vibration sense is markedly reduced in toes. Ankle jerks are absent bilaterally.          Diabetic foot exam:  pulses are absent in the feet. Has mild decrease in monofilament sensation in all toes. On the left head of the metatarsal she has an old healed ulcer with some layering of the skin MUSCULOSKELETAL:       There is no  enlargement or deformity of the joints. Spine is normal to inspection.Marland Kitchen   SKIN:       No rash or lesions of concern.        ASSESSMENT:  Diabetes type 2, insulin requiring with poor control Details of her recent history and A1c not available However she does have significantly high blood sugars now at all times only occasional good readings and mild fluctuation Today unable to analyze any glucose patterns since she does not keep her records of glucose readings clearly and does not have a meter that can be downloaded Her insulin doses have been reduced by about 50% since her recent hospitalization hypoglycemia. However her hyperglycemia it occurred because of her taking extra insulin and not eating See history of present illness for detailed discussion on current management, problems identified and recent blood sugars She is also not taking her Lantus the night before dialysis as she did not have any instructions on this. 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, peripheral  vascular disease, moderate neuropathy, history of diabetic foot ulcer, possible retinopathy  Unknown lipid status   PLAN:   Increase all doses of insulin gradually to facilitate better control as detailed in the patient instructions  To take Lantus daily regardless of whether going for dialysis  She was started using a One Touch Verio glucose monitor that was demonstrated to her today  Discussed timing and frequency of glucose monitoring and reasonable blood sugar targets for her  Consultation with nurse educator  Add protein to breakfast  Discussed causes of hypoglycemia and prevention  Continue foot care with podiatrist  Schedule eye exam as she is significantly overdue   Patient Instructions  Insulin 10 Lantus daily  Novolog 3 units with each meal and extra insulin as follows:  200-249 take 1 unit more  250-299 take 2 more and > 300  take 3 more  Must eat within 15 min of Novolog  Take sugar 4 x daily    Total visit time including counseling = 60 minutes  Deamber Buckhalter 09/16/2013, 3:31 PM   Note: This office note was prepared with Dragon voice recognition system technology. Any transcriptional errors that result from this process are unintentional.

## 2013-09-18 ENCOUNTER — Telehealth: Payer: Self-pay

## 2013-09-18 DIAGNOSIS — N186 End stage renal disease: Secondary | ICD-10-CM

## 2013-09-18 DIAGNOSIS — T82898A Other specified complication of vascular prosthetic devices, implants and grafts, initial encounter: Secondary | ICD-10-CM

## 2013-09-18 NOTE — Telephone Encounter (Signed)
Phone call from pt.  Reported she had the BVT Fistula used for the 1st time on 09/02/13, and at the 2nd treatment, the site was infiltrated.  Reported swelling and oozing following the infiltration.  Currently reports that the dialysis unit is using a dialysis catheter for her treatment.  Reported that there is no continued oozing or swelling at the site, presently.  Was advised to call and schedule appt. for evaluation of left arm fistula, before it can be accessed again.  Advised pt. will call back with appt.

## 2013-09-19 NOTE — Telephone Encounter (Signed)
Spoke with patient- scheduled for LAB on 09/23/13 and MD 10/02/13, dpm

## 2013-09-23 ENCOUNTER — Inpatient Hospital Stay (HOSPITAL_COMMUNITY): Admission: RE | Admit: 2013-09-23 | Payer: Medicare Other | Source: Ambulatory Visit

## 2013-09-24 ENCOUNTER — Other Ambulatory Visit (HOSPITAL_COMMUNITY): Payer: Medicare Other

## 2013-09-25 ENCOUNTER — Encounter: Payer: Medicare Other | Attending: Endocrinology | Admitting: Nutrition

## 2013-09-25 ENCOUNTER — Telehealth: Payer: Self-pay | Admitting: Endocrinology

## 2013-09-25 DIAGNOSIS — Z713 Dietary counseling and surveillance: Secondary | ICD-10-CM | POA: Diagnosis present

## 2013-09-25 DIAGNOSIS — N186 End stage renal disease: Secondary | ICD-10-CM | POA: Diagnosis not present

## 2013-09-25 DIAGNOSIS — E1159 Type 2 diabetes mellitus with other circulatory complications: Secondary | ICD-10-CM

## 2013-09-25 DIAGNOSIS — E119 Type 2 diabetes mellitus without complications: Secondary | ICD-10-CM | POA: Diagnosis not present

## 2013-09-25 NOTE — Telephone Encounter (Signed)
This patient is here for follow up, and did not bring her meter.  She is taking 10u of Lantus, and 3u of Novolog before meals. She says FBSs are ususally between 250-300, today it was "over 300".  She ate breakfast today, but said she forgot to take her Novolog.  Says this is rare. Blood sugars acL: 150-200, acS: 200-250, HS: around 250.

## 2013-09-25 NOTE — Progress Notes (Signed)
This patient is taking 10u of Lantus and 3 u of Novolog before meals.   She did not bring her meter, but says the FBSs are between 250-300s, acL: 150-200, acS: 200-250, HS: 20s  She reports that she ate breakfast this AM, but did not take her Novolog.  She says this rarely happens, and that she never forgets her Lantus dose.    Reading shown to dr. Lucianne Muss.  Insulin dose changed and written instructions given:

## 2013-09-25 NOTE — Patient Instructions (Signed)
Take 14u of Lantus Take meal time Novolog:  4u if blood sugars before the meal are below 200                                           5u if blood sugars before the meal are between 200-250                                           6u if blood sugars before the meal are between 250-300                                           7u if blood sugars before the meal are over 300  If eating out, take Novolog right before the meal at the restaurant--not before leaving the house

## 2013-09-26 NOTE — Telephone Encounter (Signed)
Increase Lantus to 14 units and NovoLog 4 units

## 2013-09-27 ENCOUNTER — Ambulatory Visit (HOSPITAL_COMMUNITY)
Admission: RE | Admit: 2013-09-27 | Discharge: 2013-09-27 | Disposition: A | Discharge status: Home / Self Care | Payer: Medicare Other | Source: Ambulatory Visit | Attending: Vascular Surgery | Admitting: Vascular Surgery

## 2013-09-27 DIAGNOSIS — N186 End stage renal disease: Secondary | ICD-10-CM | POA: Diagnosis not present

## 2013-09-27 DIAGNOSIS — T82898A Other specified complication of vascular prosthetic devices, implants and grafts, initial encounter: Secondary | ICD-10-CM | POA: Diagnosis present

## 2013-10-01 ENCOUNTER — Encounter: Payer: Self-pay | Admitting: Vascular Surgery

## 2013-10-02 ENCOUNTER — Encounter: Payer: Self-pay | Admitting: Vascular Surgery

## 2013-10-02 ENCOUNTER — Ambulatory Visit (INDEPENDENT_AMBULATORY_CARE_PROVIDER_SITE_OTHER): Payer: Medicare Other | Admitting: Vascular Surgery

## 2013-10-02 VITALS — BP 122/56 | HR 83 | Ht 64.0 in | Wt 84.6 lb

## 2013-10-02 DIAGNOSIS — N186 End stage renal disease: Secondary | ICD-10-CM

## 2013-10-02 DIAGNOSIS — T82898A Other specified complication of vascular prosthetic devices, implants and grafts, initial encounter: Secondary | ICD-10-CM

## 2013-10-02 NOTE — Assessment & Plan Note (Signed)
We're trying to obtain the results of her fistulogram that was done 2 days ago. If the fistula is still usable then we would only consider new access if this fistula failed. It however the feeling is that this will not be an acceptable long-term ask, certainly we can plan on placement of new access. Her veins are very small in both sides and I suspect she will require an AV graft. Certainly I would look at  her veins on the right at the time of surgery however. I would be reluctant to place new access in the right arm given the echo most of she has from a previous infiltrate. We will make further recommendations pending the results of obtaining her records from [facility].

## 2013-10-02 NOTE — Progress Notes (Signed)
Patient name: Elizabeth [name] MRN: [id number] DOB: 1945-10-09 Sex: female  REASON FOR VISIT: Follow up of left AV fistula  HPI: Elizabeth [name] is a 68 y.o. female who had a left basilic vein transposition in September of 2014. The second stage was performed in December of 2014. 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 She was last seen in follow up on 06/12/2013. At that time, the fistula appeared to be maturing adequately. Reportedly they tried to use her fistula but it infiltrated. She underwent a fistulogram by the interventional nephrologist Monday. We currently do not have these records. She does have a functioning right IJ tunneled dialysis catheter. She dialyzes on Tuesdays Thursdays and Saturdays. She states that she was pulled that they were not sure how much longer her fistula would last. It is not clear to me if she was referred for evaluation for new access, or to check on her fistula in the upper arm. We are trying to obtain the records from [facility]. She has no specific complaints.  REVIEW OF SYSTEMS: Arly.Keller ] denotes positive finding; [  ] denotes negative finding  CARDIOVASCULAR:  [ ]  chest pain   Arly.Keller ] dyspnea on exertion   She also has orthopnea. CONSTITUTIONAL:  [ ]  fever   [ ]  chills  PHYSICAL EXAM: Filed Vitals:   10/02/13 0836  BP: 122/56  Pulse: 83  Height: 5\' 4"  (1.626 m)  Weight: 84 lb 9.6 oz (38.374 kg)  SpO2: 97%   Body mass index is 14.51 kg/(m^2). GENERAL: The patient is a well-nourished female, in no acute distress. The vital signs are documented above. CARDIOVASCULAR: There is a regular rate and rhythm. PULMONARY: There is good air exchange bilaterally without wheezing or rales. The fistula in the left arm has a palpable thrill. The vein does not feel especially large. Her surface veins in the right arm are very small. She does have a palpable radial pulse bilaterally.  MEDICAL  ISSUES:  End stage renal disease We're trying to obtain the results of her fistulogram that was done 2 days ago. If the fistula is still usable then we would only consider new access if this fistula failed. It however the feeling is that this will not be an acceptable long-term ask, certainly we can plan on placement of new access. Her veins are very small in both sides and I suspect she will require an AV graft. Certainly I would look at  her veins on the right at the time of surgery however. I would be reluctant to place new access in the right arm given the echo most of she has from a previous infiltrate. We will make further recommendations pending the results of obtaining her records from [facility].   DICKSON,CHRISTOPHER S [facility]: [phone]  ADDENDUM: We have to obtain the records from her fistulogram which was done on 09/30/2013. 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 Dr Juel Burrow arrange for her to follow up with him in the next week to reassess the fistula. For this reason, I would not plan on placement of new access until Dr. Juel Burrow feels that there are no further options with the left basilic vein transposition. If she does require new access, she can either have a graft in the left arm or access in the right arm. Given the very small  size of her veins I suspect she would require a graft in the right arm also. We would not want to do anything in the left arm until her ecchymosis has resolved.  Waverly Ferrarihristopher Dickson, MD, FACS  10/02/2013

## 2013-10-11 ENCOUNTER — Encounter: Payer: Medicare Other | Admitting: Endocrinology

## 2013-10-17 ENCOUNTER — Inpatient Hospital Stay (HOSPITAL_COMMUNITY)
Admission: EM | Admit: 2013-10-17 | Discharge: 2013-10-25 | DRG: 555 | Disposition: A | Discharge status: Skilled Nursing Facility | Payer: Medicare Other | Attending: Internal Medicine | Admitting: Internal Medicine

## 2013-10-17 ENCOUNTER — Emergency Department (HOSPITAL_COMMUNITY): Payer: Medicare Other

## 2013-10-17 ENCOUNTER — Encounter (HOSPITAL_COMMUNITY): Payer: Self-pay | Admitting: Emergency Medicine

## 2013-10-17 DIAGNOSIS — I6350 Cerebral infarction due to unspecified occlusion or stenosis of unspecified cerebral artery: Secondary | ICD-10-CM

## 2013-10-17 DIAGNOSIS — E1165 Type 2 diabetes mellitus with hyperglycemia: Secondary | ICD-10-CM | POA: Diagnosis present

## 2013-10-17 DIAGNOSIS — Y92009 Unspecified place in unspecified non-institutional (private) residence as the place of occurrence of the external cause: Secondary | ICD-10-CM | POA: Diagnosis not present

## 2013-10-17 DIAGNOSIS — N319 Neuromuscular dysfunction of bladder, unspecified: Secondary | ICD-10-CM | POA: Diagnosis present

## 2013-10-17 DIAGNOSIS — N186 End stage renal disease: Secondary | ICD-10-CM | POA: Diagnosis present

## 2013-10-17 DIAGNOSIS — I120 Hypertensive chronic kidney disease with stage 5 chronic kidney disease or end stage renal disease: Secondary | ICD-10-CM | POA: Diagnosis present

## 2013-10-17 DIAGNOSIS — L8990 Pressure ulcer of unspecified site, unspecified stage: Secondary | ICD-10-CM | POA: Diagnosis present

## 2013-10-17 DIAGNOSIS — L89109 Pressure ulcer of unspecified part of back, unspecified stage: Secondary | ICD-10-CM | POA: Diagnosis present

## 2013-10-17 DIAGNOSIS — E1159 Type 2 diabetes mellitus with other circulatory complications: Secondary | ICD-10-CM

## 2013-10-17 DIAGNOSIS — N390 Urinary tract infection, site not specified: Secondary | ICD-10-CM | POA: Diagnosis present

## 2013-10-17 DIAGNOSIS — I798 Other disorders of arteries, arterioles and capillaries in diseases classified elsewhere: Secondary | ICD-10-CM | POA: Diagnosis present

## 2013-10-17 DIAGNOSIS — Z7982 Long term (current) use of aspirin: Secondary | ICD-10-CM | POA: Diagnosis not present

## 2013-10-17 DIAGNOSIS — I639 Cerebral infarction, unspecified: Secondary | ICD-10-CM

## 2013-10-17 DIAGNOSIS — W08XXXA Fall from other furniture, initial encounter: Secondary | ICD-10-CM | POA: Diagnosis present

## 2013-10-17 DIAGNOSIS — M5126 Other intervertebral disc displacement, lumbar region: Secondary | ICD-10-CM | POA: Diagnosis present

## 2013-10-17 DIAGNOSIS — Z992 Dependence on renal dialysis: Secondary | ICD-10-CM | POA: Diagnosis not present

## 2013-10-17 DIAGNOSIS — R29898 Other symptoms and signs involving the musculoskeletal system: Secondary | ICD-10-CM | POA: Diagnosis present

## 2013-10-17 DIAGNOSIS — T83511A Infection and inflammatory reaction due to indwelling urethral catheter, initial encounter: Secondary | ICD-10-CM

## 2013-10-17 DIAGNOSIS — E785 Hyperlipidemia, unspecified: Secondary | ICD-10-CM | POA: Diagnosis present

## 2013-10-17 DIAGNOSIS — R000 Tachycardia, unspecified: Secondary | ICD-10-CM | POA: Diagnosis present

## 2013-10-17 DIAGNOSIS — M899 Disorder of bone, unspecified: Secondary | ICD-10-CM | POA: Diagnosis present

## 2013-10-17 DIAGNOSIS — D638 Anemia in other chronic diseases classified elsewhere: Secondary | ICD-10-CM | POA: Diagnosis present

## 2013-10-17 DIAGNOSIS — IMO0002 Reserved for concepts with insufficient information to code with codable children: Secondary | ICD-10-CM | POA: Diagnosis present

## 2013-10-17 DIAGNOSIS — G8929 Other chronic pain: Secondary | ICD-10-CM | POA: Diagnosis present

## 2013-10-17 DIAGNOSIS — N2581 Secondary hyperparathyroidism of renal origin: Secondary | ICD-10-CM | POA: Diagnosis present

## 2013-10-17 DIAGNOSIS — Z79899 Other long term (current) drug therapy: Secondary | ICD-10-CM | POA: Diagnosis not present

## 2013-10-17 DIAGNOSIS — I2510 Atherosclerotic heart disease of native coronary artery without angina pectoris: Secondary | ICD-10-CM | POA: Diagnosis present

## 2013-10-17 DIAGNOSIS — E1169 Type 2 diabetes mellitus with other specified complication: Secondary | ICD-10-CM

## 2013-10-17 DIAGNOSIS — E11649 Type 2 diabetes mellitus with hypoglycemia without coma: Secondary | ICD-10-CM

## 2013-10-17 DIAGNOSIS — M949 Disorder of cartilage, unspecified: Secondary | ICD-10-CM | POA: Diagnosis present

## 2013-10-17 HISTORY — DX: Urinary tract infection, site not specified: N39.0

## 2013-10-17 LAB — RENAL FUNCTION PANEL
Albumin: 2.4 g/dL — ABNORMAL LOW (ref 3.5–5.2)
Anion gap: 24 — ABNORMAL HIGH (ref 5–15)
BUN: 52 mg/dL — AB (ref 6–23)
CALCIUM: 8.5 mg/dL (ref 8.4–10.5)
CO2: 21 meq/L (ref 19–32)
Chloride: 95 mEq/L — ABNORMAL LOW (ref 96–112)
Creatinine, Ser: 4.19 mg/dL — ABNORMAL HIGH (ref 0.50–1.10)
GFR calc Af Amer: 12 mL/min — ABNORMAL LOW (ref >90)
GFR calc non Af Amer: 10 mL/min — ABNORMAL LOW (ref >90)
GLUCOSE: 232 mg/dL — AB (ref 70–99)
PHOSPHORUS: 6.8 mg/dL — AB (ref 2.3–4.6)
Potassium: 3.6 mEq/L — ABNORMAL LOW (ref 3.7–5.3)
SODIUM: 140 meq/L (ref 137–147)

## 2013-10-17 LAB — CBC WITH DIFFERENTIAL/PLATELET
BASOS PCT: 0 % (ref 0–1)
Basophils Absolute: 0 10*3/uL (ref 0.0–0.1)
EOS ABS: 0 10*3/uL (ref 0.0–0.7)
EOS PCT: 0 % (ref 0–5)
HEMATOCRIT: 43.7 % (ref 36.0–46.0)
Hemoglobin: 13.8 g/dL (ref 12.0–15.0)
Lymphocytes Relative: 2 % — ABNORMAL LOW (ref 12–46)
Lymphs Abs: 0.3 10*3/uL — ABNORMAL LOW (ref 0.7–4.0)
MCH: 26.6 pg (ref 26.0–34.0)
MCHC: 31.6 g/dL (ref 30.0–36.0)
MCV: 84.2 fL (ref 78.0–100.0)
MONO ABS: 0.9 10*3/uL (ref 0.1–1.0)
Monocytes Relative: 6 % (ref 3–12)
Neutro Abs: 14.5 10*3/uL — ABNORMAL HIGH (ref 1.7–7.7)
Neutrophils Relative %: 92 % — ABNORMAL HIGH (ref 43–77)
Platelets: 122 10*3/uL — ABNORMAL LOW (ref 150–400)
RBC: 5.19 MIL/uL — ABNORMAL HIGH (ref 3.87–5.11)
RDW: 17.7 % — ABNORMAL HIGH (ref 11.5–15.5)
WBC: 15.7 10*3/uL — ABNORMAL HIGH (ref 4.0–10.5)

## 2013-10-17 LAB — BASIC METABOLIC PANEL
Anion gap: 25 — ABNORMAL HIGH (ref 5–15)
BUN: 47 mg/dL — AB (ref 6–23)
CO2: 20 mEq/L (ref 19–32)
CREATININE: 4.09 mg/dL — AB (ref 0.50–1.10)
Calcium: 9 mg/dL (ref 8.4–10.5)
Chloride: 94 mEq/L — ABNORMAL LOW (ref 96–112)
GFR, EST AFRICAN AMERICAN: 12 mL/min — AB (ref >90)
GFR, EST NON AFRICAN AMERICAN: 10 mL/min — AB (ref >90)
Glucose, Bld: 222 mg/dL — ABNORMAL HIGH (ref 70–99)
Potassium: 3.8 mEq/L (ref 3.7–5.3)
Sodium: 139 mEq/L (ref 137–147)

## 2013-10-17 LAB — URINE MICROSCOPIC-ADD ON

## 2013-10-17 LAB — URINALYSIS, ROUTINE W REFLEX MICROSCOPIC
Glucose, UA: NEGATIVE mg/dL
Ketones, ur: 15 mg/dL — AB
Nitrite: NEGATIVE
SPECIFIC GRAVITY, URINE: 1.015 (ref 1.005–1.030)
Urobilinogen, UA: 1 mg/dL (ref 0.0–1.0)
pH: 6.5 (ref 5.0–8.0)

## 2013-10-17 LAB — HEPATITIS B SURFACE ANTIGEN: Hepatitis B Surface Ag: NEGATIVE

## 2013-10-17 LAB — GLUCOSE, CAPILLARY: Glucose-Capillary: 114 mg/dL — ABNORMAL HIGH (ref 70–99)

## 2013-10-17 MED ORDER — RENA-VITE PO TABS
1.0000 | ORAL_TABLET | Freq: Every day | ORAL | Status: DC
Start: 2013-10-17 — End: 2013-10-25
  Administered 2013-10-18 – 2013-10-24 (×7): 1 via ORAL
  Filled 2013-10-17 (×7): qty 1

## 2013-10-17 MED ORDER — INSULIN ASPART 100 UNIT/ML ~~LOC~~ SOLN
0.0000 [IU] | SUBCUTANEOUS | Status: DC
Start: 2013-10-18 — End: 2013-10-23
  Administered 2013-10-18 (×2): 3 [IU] via SUBCUTANEOUS
  Administered 2013-10-18: 5 [IU] via SUBCUTANEOUS
  Administered 2013-10-18: 3 [IU] via SUBCUTANEOUS
  Administered 2013-10-18: 2 [IU] via SUBCUTANEOUS
  Administered 2013-10-19: 3 [IU] via SUBCUTANEOUS
  Administered 2013-10-19: 1 [IU] via SUBCUTANEOUS
  Administered 2013-10-20 (×2): 3 [IU] via SUBCUTANEOUS
  Administered 2013-10-20: 2 [IU] via SUBCUTANEOUS
  Administered 2013-10-20: 1 [IU] via SUBCUTANEOUS
  Administered 2013-10-21: 2 [IU] via SUBCUTANEOUS
  Administered 2013-10-21: 1 [IU] via SUBCUTANEOUS
  Administered 2013-10-21 (×2): 2 [IU] via SUBCUTANEOUS
  Administered 2013-10-21: 7 [IU] via SUBCUTANEOUS
  Administered 2013-10-21 – 2013-10-22 (×3): 3 [IU] via SUBCUTANEOUS
  Administered 2013-10-23: 2 [IU] via SUBCUTANEOUS

## 2013-10-17 MED ORDER — NEPRO/CARBSTEADY PO LIQD: 237.0000 mL | ORAL | Status: DC | PRN

## 2013-10-17 MED ORDER — ENOXAPARIN SODIUM 30 MG/0.3ML ~~LOC~~ SOLN: 30.0000 mg | SUBCUTANEOUS | Status: DC

## 2013-10-17 MED ORDER — ACETAMINOPHEN 325 MG PO TABS
650.0000 mg | ORAL_TABLET | ORAL | Status: DC | PRN
  Administered 2013-10-20 – 2013-10-24 (×4): 650 mg via ORAL
  Filled 2013-10-17 (×3): qty 2

## 2013-10-17 MED ORDER — SIMVASTATIN 5 MG PO TABS
10.0000 mg | ORAL_TABLET | Freq: Every day | ORAL | Status: DC
  Administered 2013-10-18 – 2013-10-24 (×7): 10 mg via ORAL
  Filled 2013-10-17 (×9): qty 2

## 2013-10-17 MED ORDER — ASPIRIN 325 MG PO TABS
325.0000 mg | ORAL_TABLET | Freq: Every day | ORAL | Status: DC
  Administered 2013-10-18 – 2013-10-20 (×3): 325 mg via ORAL
  Filled 2013-10-17 (×3): qty 1

## 2013-10-17 MED ORDER — SODIUM CHLORIDE 0.9 % IV SOLN: 100.0000 mL | INTRAVENOUS | Status: DC | PRN

## 2013-10-17 MED ORDER — CEFTRIAXONE SODIUM 1 G IJ SOLR
1.0000 g | INTRAMUSCULAR | Status: DC
Start: 2013-10-18 — End: 2013-10-20
  Administered 2013-10-18 – 2013-10-19 (×2): 1 g via INTRAVENOUS
  Filled 2013-10-17 (×3): qty 10

## 2013-10-17 MED ORDER — HEPARIN SODIUM (PORCINE) 1000 UNIT/ML DIALYSIS
1000.0000 [IU] | INTRAMUSCULAR | Status: DC | PRN
  Filled 2013-10-17: qty 1

## 2013-10-17 MED ORDER — PENTAFLUOROPROP-TETRAFLUOROETH EX AERO: 1.0000 "application " | INHALATION_SPRAY | CUTANEOUS | Status: DC | PRN

## 2013-10-17 MED ORDER — ASPIRIN 81 MG PO CHEW
324.0000 mg | CHEWABLE_TABLET | Freq: Once | ORAL | Status: AC
  Administered 2013-10-17: 324 mg via ORAL
  Filled 2013-10-17: qty 4

## 2013-10-17 MED ORDER — LIDOCAINE HCL (PF) 1 % IJ SOLN: 5.0000 mL | INTRAMUSCULAR | Status: DC | PRN

## 2013-10-17 MED ORDER — LIDOCAINE-PRILOCAINE 2.5-2.5 % EX CREA: 1.0000 "application " | TOPICAL_CREAM | CUTANEOUS | Status: DC | PRN

## 2013-10-17 MED ORDER — DEXTROSE 5 % IV SOLN
1.0000 g | Freq: Once | INTRAVENOUS | Status: AC
  Administered 2013-10-17: 1 g via INTRAVENOUS
  Filled 2013-10-17: qty 10

## 2013-10-17 MED ORDER — INSULIN GLARGINE 100 UNIT/ML ~~LOC~~ SOLN
14.0000 [IU] | Freq: Every day | SUBCUTANEOUS | Status: DC
  Administered 2013-10-18 – 2013-10-22 (×6): 14 [IU] via SUBCUTANEOUS
  Filled 2013-10-17 (×6): qty 0.14

## 2013-10-17 MED ORDER — RENA-VITE PO TABS: 1.0000 | ORAL_TABLET | Freq: Every day | ORAL | Status: DC

## 2013-10-17 MED ORDER — ALTEPLASE 2 MG IJ SOLR
2.0000 mg | Freq: Once | INTRAMUSCULAR | Status: AC | PRN
  Filled 2013-10-17: qty 2

## 2013-10-17 MED ORDER — SODIUM CHLORIDE 0.9 % IV SOLN
INTRAVENOUS | Status: DC
  Administered 2013-10-18: 75 mL/h via INTRAVENOUS
  Administered 2013-10-18 – 2013-10-21 (×3): via INTRAVENOUS

## 2013-10-17 MED ORDER — ACETAMINOPHEN 650 MG RE SUPP: 650.0000 mg | RECTAL | Status: DC | PRN

## 2013-10-17 MED ORDER — ASPIRIN 300 MG RE SUPP: 300.0000 mg | Freq: Every day | RECTAL | Status: DC

## 2013-10-17 MED ORDER — STROKE: EARLY STAGES OF RECOVERY BOOK
Freq: Once | Status: AC
  Administered 2013-10-18: 01:00:00
  Filled 2013-10-17: qty 1

## 2013-10-17 MED ORDER — CALCIUM ACETATE 667 MG PO CAPS
1334.0000 mg | ORAL_CAPSULE | Freq: Three times a day (TID) | ORAL | Status: DC
Start: 2013-10-18 — End: 2013-10-18
  Administered 2013-10-18: 1334 mg via ORAL
  Filled 2013-10-17 (×5): qty 2

## 2013-10-17 MED ORDER — SODIUM CHLORIDE 0.9 % IV SOLN
62.5000 mg | INTRAVENOUS | Status: DC
  Administered 2013-10-24: 62.5 mg via INTRAVENOUS
  Filled 2013-10-17 (×3): qty 5

## 2013-10-17 NOTE — ED Notes (Addendum)
Per EMS, Pt sts she went to get up off the couch last night and sts her L leg was really weak and she fell off of couch. Pt sts L leg weakness started yesterday around 0800 and she has been dragging her leg since. Pt denies any other weakness. Pt hx DM. EMS CBG 196. PT is taking Asprin 81 mg Pt goes to dialysis T, Th, Sat and did not go to dialysis today. Pt denies any pain. Pt denies hx of stroke (med hx notes pt has hx of stroke and pt is unaware.)

## 2013-10-17 NOTE — ED Provider Notes (Signed)
CSN: [id number]     Arrival date & time 10/17/13  0919 History   First MD Initiated Contact with Patient 10/17/13 [phone]     Chief Complaint  Patient presents with  . Extremity Weakness    L leg     (Consider location/radiation/quality/duration/timing/severity/associated sxs/prior Treatment) Patient is a 68 y.o. female presenting with extremity weakness.  Extremity Weakness This is a new problem. The current episode started yesterday. The problem occurs constantly. The problem has not changed since onset.Pertinent negatives include no chest pain, no abdominal pain and no shortness of breath. Nothing aggravates the symptoms. Nothing relieves the symptoms. She has tried nothing for the symptoms.    Past Medical History  Diagnosis Date  . Hyperlipidemia   . Urinary retention     DX NEUROGENIC BLADDER--HAS INDWELLING FOLEY CATHETER  . Arthritis   . Anemia   . CAD (coronary artery disease)   . Neuropathic pain   . Shortness of breath     WITH EXERTION  . Stroke     patient not aware.  . Foley catheter in place     for urinary retention  . Critical lower limb ischemia   . Hypertension   . Peripheral vascular disease   . Hypoglycemia 08/19/2013  . Hypokalemia 08/19/2013  . Chronic kidney disease     PT TOLD RECENT TESTING SHOWS KIDNEY DAMAGE--IS SEEING NEPHROLOGIST.  OFFICE NOTES OBTAINED DR. MATTINGLY -"CHRONIC KIDNEY DISEASE 4 MOST LIKELY SECONDARY TO DIABETES"  . Diabetes mellitus without complication     TAKES INSULIN    Past Surgical History  Procedure Laterality Date  . No previous surgery    . Insertion of suprapubic catheter N/A 05/16/2012    Procedure: INSERTION OF SUPRAPUBIC CATHETER;  Surgeon: Sebastian Ache, MD;  Location: [facility];  Service: Urology;  Laterality: N/A;  . Bascilic vein transposition Left 10/30/2012    Procedure: LEFT 1ST STAGE BASCILIC VEIN TRANSPOSITION;  Surgeon: Chuck Hint, MD;  Location: [facility] OR;  Service: Vascular;  Laterality: Left;  . Nm  myocar perf wall motion  04/19/2012    low risk study-EF 44%,mild inferolateral hypkinesis  . Bascilic vein transposition Left 02/05/2013    Procedure: BASCILIC VEIN TRANSPOSITION 2ND STAGE;  Surgeon: Chuck Hint, MD;  Location: [facility] OR;  Service: Vascular;  Laterality: Left;   Family History  Problem Relation Age of Onset  . Hyperlipidemia Mother   . Hypertension Mother   . Hodgkin's lymphoma Father   . Heart disease Sister    History  Substance Use Topics  . Smoking status: Never Smoker   . Smokeless tobacco: Never Used  . Alcohol Use: No     Comment: 1 OR 2 BEER A WEEK   OB History   Grav Para Term Preterm Abortions TAB SAB Ect Mult Living                 Review of Systems  Constitutional: Negative for fever.  Respiratory: Negative for shortness of breath.   Cardiovascular: Negative for chest pain.  Gastrointestinal: Negative for abdominal pain.  Musculoskeletal: Positive for extremity weakness.  All other systems reviewed and are negative.     Allergies  Review of patient's allergies indicates no known allergies.  Home Medications   Prior to Admission medications   Medication Sig Start Date End Date Taking? Authorizing Provider  aspirin EC 81 MG tablet Take 81 mg by mouth daily.    Historical Provider, MD  carvedilol (COREG) 6.25 MG tablet take 1 tablet by  mouth twice a day 03/29/13   Chrystie Nose, MD  glucose blood (ONETOUCH VERIO) test strip Use as instructed to check blood sugar 4 times per day dx code 250.00 09/16/13   [name], MD  insulin aspart (NOVOLOG) 100 UNIT/ML injection Inject 2 Units into the skin 3 (three) times daily with meals as needed for high blood sugar. 08/20/13   Ejiroghene Wendall Stade, MD  insulin glargine (LANTUS) 100 UNIT/ML injection Inject 0.07 mLs (7 Units total) into the skin at bedtime. 08/20/13   Ejiroghene Wendall Stade, MD  multivitamin (RENA-VIT) TABS tablet Take 1 tablet by mouth daily.    Historical Provider, MD  [facility]  DELICA LANCETS FINE MISC Use to check blood sugar 4 times per day 09/16/13   [name], MD  pravastatin (PRAVACHOL) 20 MG tablet Take 20 mg by mouth at bedtime.     Historical Provider, MD   BP 153/72  Pulse 92  Temp(Src) 98.6 F (37 C) (Oral)  Resp 23  Ht  (1.6 m)  Wt 83 lb (37.649 kg)  BMI 14.71 kg/m2  SpO2 100% Physical Exam  Nursing note and vitals reviewed. Constitutional: She is oriented to person, place, and time. She appears well-developed. She appears cachectic. No distress.  HENT:  Head: Normocephalic and atraumatic.  Mouth/Throat: Oropharynx is clear and moist.  Eyes: Conjunctivae are normal. Pupils are equal, round, and reactive to light. No scleral icterus.  Neck: Neck supple.  Cardiovascular: Normal rate, regular rhythm, normal heart sounds and intact distal pulses.   No murmur heard. Pulmonary/Chest: Effort normal and breath sounds normal. No stridor. No respiratory distress. She has no rales.  Abdominal: Soft. Bowel sounds are normal. She exhibits no distension. There is no tenderness.  Genitourinary:  Urinary catheter in place  Musculoskeletal: Normal range of motion.       Thoracic back: She exhibits tenderness (over open sore). She exhibits no bony tenderness.       Lumbar back: She exhibits no bony tenderness.  Decreased pulses in bilateral feet.  Both feet have easily dopplerable DP pulses, are warm, and appear adequately perfused.   2+ femoral pulses bilaterally.  Neurological: She is alert and oriented to person, place, and time. No cranial nerve deficit or sensory deficit.  1/5 strength in LLE 5/5 strength in all other extremities  Skin: Skin is warm and dry. No rash noted.  Multiple open sores over body  Psychiatric: Her behavior is normal.  Flat affect    ED Course  Procedures (including critical care time) Labs Review Labs Reviewed  CBC WITH DIFFERENTIAL - Abnormal; Notable for the following:    WBC 15.7 (*)    RBC 5.19 (*)    RDW 17.7 (*)     Platelets 122 (*)    Neutrophils Relative % 92 (*)    Neutro Abs 14.5 (*)    Lymphocytes Relative 2 (*)    Lymphs Abs 0.3 (*)    All other components within normal limits  BASIC METABOLIC PANEL - Abnormal; Notable for the following:    Chloride 94 (*)    Glucose, Bld 222 (*)    BUN 47 (*)    Creatinine, Ser 4.09 (*)    GFR calc non Af Amer 10 (*)    GFR calc Af Amer 12 (*)    Anion gap 25 (*)    All other components within normal limits  URINALYSIS, ROUTINE W REFLEX MICROSCOPIC - Abnormal; Notable for the following:    Color, Urine  AMBER (*)    APPearance TURBID (*)    Hgb urine dipstick LARGE (*)    Bilirubin Urine MODERATE (*)    Ketones, ur 15 (*)    Protein, ur >300 (*)    Leukocytes, UA LARGE (*)    All other components within normal limits  URINE MICROSCOPIC-ADD ON - Abnormal; Notable for the following:    Squamous Epithelial / LPF FEW (*)    Bacteria, UA MANY (*)    All other components within normal limits  URINE CULTURE    Imaging Review Ct Head Wo Contrast  10/17/2013   CLINICAL DATA:  Onset of left leg weakness since yesterday  EXAM: CT HEAD WITHOUT CONTRAST  TECHNIQUE: Contiguous axial images were obtained from the base of the skull through the vertex without intravenous contrast.  COMPARISON:  Noncontrast CT scan of the brain of August 19, 2013  FINDINGS: There is mild stable age appropriate diffuse cerebral and cerebellar atrophy with mild compensatory ventriculomegaly. There are stable punctate basal ganglia calcifications bilaterally. There is no evidence of acute ischemic change. The cerebellum and brainstem are normal.  There is mucoperiosteal thickening within theleft maxillary sinus and a the observed paranasal sinuses elsewhere are clear. The mastoid air cells are well pneumatized. There is no lytic or blastic skull lesion and no evidence of an acute fracture. Left sphenoid sinus cell.  IMPRESSION: 1. There is no acute intracranial hemorrhage nor acute ischemic  change. 2. There are mild stable diffuse atrophic changes. 3. There are inflammatory changes within the left maxillary and left sphenoid sinus cells.   Electronically Signed   By: David  [place]   On: 10/17/2013 10:46  All radiology studies independently viewed by me.      EKG Interpretation   Date/Time:  Thursday October 17 2013 10:22:36 EDT Ventricular Rate:  93 PR Interval:  132 QRS Duration: 87 QT Interval:  405 QTC Calculation: 504 R Axis:   -64 Text Interpretation:  Age not entered, assumed to be  68 years old for  purpose of ECG interpretation Sinus rhythm Left anterior fascicular block  Anteroseptal infarct, age indeterminate Prolonged QT interval Baseline  wander in lead(s) V2 V3 appears similar to prior. Confirmed by [facility]   MD, Thurston Pounds ([id number]) on 10/17/2013 3:42:09 PM      MDM   Final diagnoses:  Urinary tract infection associated with catheterization of urinary tract, initial encounter  End stage renal disease  ESRD on hemodialysis  HLD (hyperlipidemia)  Left leg weakness  Type II or unspecified type diabetes mellitus with peripheral circulatory disorders, uncontrolled(250.72)    68 yo female with left leg weakness since yesterday morning.  Concern for CVA.  No code stroke called and no tPA given due to time since onset of symptoms.  She has no upper extremity weakness.  No back pain or fevers to suggest spinal pathology.  CT head negative.  Plan admission for further stroke workup.  Have discussed case with Dr. Thad Ranger (neurology) and Dr. Isidoro Donning (internal medicine).  She also has a UTI (foley replaced and UA drawn from new foley).  Given ceftriaxone.      Candyce Churn III, MD 10/17/13 [phone]

## 2013-10-17 NOTE — Consult Note (Signed)
[facility] Renal Consultation Note  Indication for Consultation:  Management of ESRD/hemodialysis; anemia, hypertension/volume and secondary hyperparathyroidism  HPI: Elizabeth [name] is a 68 y.o. female with a history of Diabetes Type 2, hypertension, neurogenic bladder with chronic Foley catheter, CAD, and ESRD, starting dialysis on 09/03/13 at the [facility], who presented to the ED today with left upper and lower extremity weakness and will be admitted with concern for stroke.  She reports that she has had left leg weakness since returning home from dialysis on Tuesday 8/25 and then fell when attempting to get up from her couch last night.  CT of the head showed no acute intracranial hemorrhage or ischemic changes, but she continues to have left leg weakness and, to a lesser extent, left arm weakness.  Urinalysis suggested urinary tract infection, and her indwelling Foley catheter was replaced in the ED.  Dialysis Orders:  TTS @ [place] 3:30      38 kg    2K/2.25Ca     400/A1.5      R IJ catheter, also AVF @ [facility]     Heparin 3900 U Hectorol 2 mcg        Aranesp 80 mcg & Venofer 50 mg on Thurs  Past Medical History  Diagnosis Date  . Hyperlipidemia   . Urinary retention     DX NEUROGENIC BLADDER--HAS INDWELLING FOLEY CATHETER  . Arthritis   . Anemia   . CAD (coronary artery disease)   . Neuropathic pain   . Shortness of breath     WITH EXERTION  . Stroke     patient not aware.  . Foley catheter in place     for urinary retention  . Critical lower limb ischemia   . Hypertension   . Peripheral vascular disease   . Hypoglycemia 08/19/2013  . Hypokalemia 08/19/2013  . Chronic kidney disease     PT TOLD RECENT TESTING SHOWS KIDNEY DAMAGE--IS SEEING NEPHROLOGIST.  OFFICE NOTES OBTAINED DR. MATTINGLY -"CHRONIC KIDNEY DISEASE 4 MOST LIKELY SECONDARY TO DIABETES"  . Diabetes mellitus without complication     TAKES INSULIN    Past Surgical History  Procedure  Laterality Date  . No previous surgery    . Insertion of suprapubic catheter N/A 05/16/2012    Procedure: INSERTION OF SUPRAPUBIC CATHETER;  Surgeon: Alexis Frock, MD;  Location: [facility];  Service: Urology;  Laterality: N/A;  . Bascilic vein transposition Left 10/30/2012    Procedure: LEFT 1ST STAGE BASCILIC VEIN TRANSPOSITION;  Surgeon: Angelia Mould, MD;  Location: [facility] OR;  Service: Vascular;  Laterality: Left;  . Nm myocar perf wall motion  04/19/2012    low risk study-EF 44%,mild inferolateral hypkinesis  . Bascilic vein transposition Left 02/05/2013    Procedure: BASCILIC VEIN TRANSPOSITION 2ND STAGE;  Surgeon: Angelia Mould, MD;  Location: [facility] OR;  Service: Vascular;  Laterality: Left;   Family History  Problem Relation Age of Onset  . Hyperlipidemia Mother   . Hypertension Mother   . Hodgkin's lymphoma Father   . Heart disease Sister    Social History  She denies any history of tobacco or illicit drug use, and alcohol use only includes an occasional beer.  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  No Known Allergies Prior to Admission medications   Medication Sig Start Date End Date Taking? Authorizing Provider  aspirin EC 81 MG tablet Take 81 mg by mouth daily.  Yes Historical Provider, MD  carvedilol (COREG) 6.25 MG tablet take 1 tablet by mouth twice a day 03/29/13  Yes Pixie Casino, MD  insulin aspart (NOVOLOG) 100 UNIT/ML injection Inject 4 Units into the skin 2 (two) times daily.   Yes Historical Provider, MD  insulin glargine (LANTUS) 100 UNIT/ML injection Inject 14 Units into the skin at bedtime.   Yes Historical Provider, MD  multivitamin (RENA-VIT) TABS tablet Take 1 tablet by mouth daily.   Yes Historical Provider, MD  pravastatin (PRAVACHOL) 20 MG tablet Take 20 mg by mouth at bedtime.    Yes Historical Provider, MD  glucose blood (ONETOUCH VERIO) test strip Use as instructed to check blood sugar 4  times per day dx code 250.00 09/16/13   Elayne Snare, MD  [facility] DELICA LANCETS FINE MISC Use to check blood sugar 4 times per day 09/16/13   Elayne Snare, MD   Labs:  Results for orders placed during the hospital encounter of 10/17/13 (from the past 48 hour(s))  CBC WITH DIFFERENTIAL     Status: Abnormal   Collection Time    10/17/13 11:33 AM      Result Value Ref Range   WBC 15.7 (*) 4.0 - 10.5 K/uL   RBC 5.19 (*) 3.87 - 5.11 MIL/uL   Hemoglobin 13.8  12.0 - 15.0 g/dL   HCT 43.7  36.0 - 46.0 %   MCV 84.2  78.0 - 100.0 fL   MCH 26.6  26.0 - 34.0 pg   MCHC 31.6  30.0 - 36.0 g/dL   RDW 17.7 (*) 11.5 - 15.5 %   Platelets 122 (*) 150 - 400 K/uL   Neutrophils Relative % 92 (*) 43 - 77 %   Neutro Abs 14.5 (*) 1.7 - 7.7 K/uL   Lymphocytes Relative 2 (*) 12 - 46 %   Lymphs Abs 0.3 (*) 0.7 - 4.0 K/uL   Monocytes Relative 6  3 - 12 %   Monocytes Absolute 0.9  0.1 - 1.0 K/uL   Eosinophils Relative 0  0 - 5 %   Eosinophils Absolute 0.0  0.0 - 0.7 K/uL   Basophils Relative 0  0 - 1 %   Basophils Absolute 0.0  0.0 - 0.1 K/uL  BASIC METABOLIC PANEL     Status: Abnormal   Collection Time    10/17/13 11:33 AM      Result Value Ref Range   Sodium 139  137 - 147 mEq/L   Potassium 3.8  3.7 - 5.3 mEq/L   Chloride 94 (*) 96 - 112 mEq/L   CO2 20  19 - 32 mEq/L   Glucose, Bld 222 (*) 70 - 99 mg/dL   BUN 47 (*) 6 - 23 mg/dL   Creatinine, Ser 4.09 (*) 0.50 - 1.10 mg/dL   Calcium 9.0  8.4 - 10.5 mg/dL   GFR calc non Af Amer 10 (*) >90 mL/min   GFR calc Af Amer 12 (*) >90 mL/min   Comment: (NOTE)     The eGFR has been calculated using the CKD EPI equation.     This calculation has not been validated in all clinical situations.     eGFR's persistently <90 mL/min signify possible Chronic Kidney     Disease.   Anion gap 25 (*) 5 - 15  URINALYSIS, ROUTINE W REFLEX MICROSCOPIC     Status: Abnormal   Collection Time    10/17/13 12:37 PM      Result Value Ref Range  Color, Urine AMBER (*) YELLOW    Comment: BIOCHEMICALS MAY BE AFFECTED BY COLOR   APPearance TURBID (*) CLEAR   Specific Gravity, Urine 1.015  1.005 - 1.030   pH 6.5  5.0 - 8.0   Glucose, UA NEGATIVE  NEGATIVE mg/dL   Hgb urine dipstick LARGE (*) NEGATIVE   Bilirubin Urine MODERATE (*) NEGATIVE   Ketones, ur 15 (*) NEGATIVE mg/dL   Protein, ur >300 (*) NEGATIVE mg/dL   Urobilinogen, UA 1.0  0.0 - 1.0 mg/dL   Nitrite NEGATIVE  NEGATIVE   Leukocytes, UA LARGE (*) NEGATIVE  URINE MICROSCOPIC-ADD ON     Status: Abnormal   Collection Time    10/17/13 12:37 PM      Result Value Ref Range   Squamous Epithelial / LPF FEW (*) RARE   WBC, UA TOO NUMEROUS TO COUNT  <3 WBC/hpf   RBC / HPF 21-50  <3 RBC/hpf   Bacteria, UA MANY (*) RARE   Constitutional: negative for chills, fatigue, fevers and sweats Ears, nose, mouth, throat, and face: negative for earaches, hoarseness, nasal congestion and sore throat Respiratory: negative for cough, dyspnea on exertion, hemoptysis and sputum Cardiovascular: negative for chest pain, chest pressure/discomfort, dyspnea, orthopnea and palpitations Gastrointestinal: negative for abdominal pain, change in bowel habits, nausea and vomiting Genitourinary:negative, oliguric Hematologic/lymphatic: negative Musculoskeletal:negative for arthralgias, back pain, myalgias and neck pain Neurological: positive for coordination problems, gait problems and left extremity weakness (leg>>arm), negative for dizziness, headaches and speech problems  Physical Exam: Filed Vitals:   10/17/13 1531  BP: 148/74  Pulse: 89  Temp:   Resp: 20     General appearance: alert, cooperative and no distress, left facial drooping Head: Normocephalic, without obvious abnormality, atraumatic Neck: no adenopathy, no carotid bruit, no JVD and supple, symmetrical, trachea midline Resp: clear to auscultation bilaterally Cardio: regular rate and rhythm, S1, S2 normal, no murmur, click, rub or gallop GI: soft, non-tender; bowel  sounds normal; no masses,  no organomegaly Extremities: extremities normal, atraumatic, no cyanosis or edema Neurologic: Alert and oriented x 3, left extremity weakness (leg>>> arm) Dialysis Access: R IJ catheter, AVF @ [facility] with + bruit   Assessment/Plan: 1. Left extremity weakness - no Hx of CVA, CT head negative, on ASA qd.  MRI/MRA head, 2D echo, carotid Dopplers, Neurology consult pending. 2. UTI - per UA with culture pending, Foley catheter exchanged, started Rocephin.. 3. ESRD - HD on TTS @ [place], K 3.8.  HD pending. 4. Hypertension/volume - BP 148/74 on Carvedilol 12.5 mg bid; @ EDW. 5. Anemia - Hgb13.8, on Aranesp 80 mcg & weekly Venofer.  Hold Aranesp. 6. Metabolic bone disease - Ca 9, last P 6.5, iPTH 356; Hectorol 2 mcg, Phoslo 2 with meals. 7. Nutrition - Alb 3.6, renal diet, multivitamin. 8. DM - insulin per primary 9. Neurogenic bladder - chronic indwelling Foley catheter, followed by Dr. Tammi Klippel.  LYLES,CHARLES 10/17/2013, 3:42 PM   Attending Nephrologist: Roney Jaffe, MD  Pt seen, examined and agree w A/P as above. ESRD patient recently started HD in July presenting with L hemiparesis, facial droop and neg head CT.  No prior hx of CVA.  Has indwelling foley for NGB.  She is stable from a renal standpoint, plan HD later tonight when a spot opens up.  Keep BP over 120, will not pull fluid with HD.  Kelly Splinter MD pager [phone]    cell [phone] 10/17/2013, 5:13 PM

## 2013-10-17 NOTE — Procedures (Signed)
I was present at this dialysis session, have reviewed the session itself and made  appropriate changes  Vinson Moselle MD (pgr) [phone]    [phone] 10/17/2013, 5:16 PM

## 2013-10-17 NOTE — Progress Notes (Signed)
Patient arrived to 4N via bed in stable condition. Patient in bed with call light in reach. Telemetry placed. Will continue to monitor patient closely. Monia Pouch, RN

## 2013-10-17 NOTE — ED Notes (Signed)
Patient transported to CT 

## 2013-10-17 NOTE — H&P (Addendum)
History and Physical       Hospital Admission Note Date: 10/17/2013  Patient name: Elizabeth [name] Medical record number: [id number] Date of birth: 1945-11-18 Age: 68 y.o. Gender: female  PCP: [name], MD    Chief Complaint:  Left leg weakness for last 2 days   HPI: Patient is a 68 year old female with history of diabetes, hyperlipidemia, hypertension, ESRD on hemodialysis, TTS presented to the ER with left leg weakness since Tuesday. Patient reported that she was in her normal baseline health, ambulated without any assistance until Tuesday she went to hemodialysis 12-3pm. After the hemodialysis, she got back home, and she noted that her left leg was weak. The patient has been dragging her leg since. Patient reported that she got up off the couch last night to fix herself meal and fell off the couch. She has abrasions on the left arm and left knee. She did not go to hemodialysis today. Patient also has indwelling Foley catheter and had noted foul-smelling urine for last few days. She otherwise denied any fevers, chills, chest pain, shortness breath, nausea and vomiting. Foley catheter was changed in the ED. UA positive for UTI, CT head negative for acute CVA  Review of Systems:  Constitutional: Denies fever, chills, diaphoresis, poor appetite and fatigue.  HEENT: Denies photophobia, eye pain, redness, hearing loss, ear pain, congestion, sore throat, rhinorrhea, sneezing, mouth sores, trouble swallowing, neck pain, neck stiffness and tinnitus.   Respiratory: Denies SOB, DOE, cough, chest tightness,  and wheezing.   Cardiovascular: Denies chest pain, palpitations and leg swelling.  Gastrointestinal: Denies nausea, vomiting, abdominal pain, diarrhea, constipation, blood in stool and abdominal distention.  Genitourinary: Denies dysuria, urgency, frequency, hematuria, flank pain and difficulty urinating.  Musculoskeletal: Denies myalgias,  back pain, joint swelling, arthralgias and gait problem.  Skin: Denies pallor, rash and wound.  Neurological: Please see history of present illness Hematological: Denies adenopathy. Easy bruising, personal or family bleeding history  Psychiatric/Behavioral: Denies suicidal ideation, mood changes, confusion, nervousness, sleep disturbance and agitation  Past Medical History: Past Medical History  Diagnosis Date  . Hyperlipidemia   . Urinary retention     DX NEUROGENIC BLADDER--HAS INDWELLING FOLEY CATHETER  . Arthritis   . Anemia   . CAD (coronary artery disease)   . Neuropathic pain   . Shortness of breath     WITH EXERTION  . Stroke     patient not aware.  . Foley catheter in place     for urinary retention  . Critical lower limb ischemia   . Hypertension   . Peripheral vascular disease   . Hypoglycemia 08/19/2013  . Hypokalemia 08/19/2013  . Chronic kidney disease     PT TOLD RECENT TESTING SHOWS KIDNEY DAMAGE--IS SEEING NEPHROLOGIST.  OFFICE NOTES OBTAINED DR. MATTINGLY -"CHRONIC KIDNEY DISEASE 4 MOST LIKELY SECONDARY TO DIABETES"  . Diabetes mellitus without complication     TAKES INSULIN    Past Surgical History  Procedure Laterality Date  . No previous surgery    . Insertion of suprapubic catheter N/A 05/16/2012    Procedure: INSERTION OF SUPRAPUBIC CATHETER;  Surgeon: Sebastian Ache, MD;  Location: [facility];  Service: Urology;  Laterality: N/A;  . Bascilic vein transposition Left 10/30/2012    Procedure: LEFT 1ST STAGE BASCILIC VEIN TRANSPOSITION;  Surgeon: Chuck Hint, MD;  Location: [facility] OR;  Service: Vascular;  Laterality: Left;  . Nm myocar perf wall motion  04/19/2012    low risk study-EF 44%,mild inferolateral hypkinesis  . Bascilic vein transposition  Left 02/05/2013    Procedure: BASCILIC VEIN TRANSPOSITION 2ND STAGE;  Surgeon: Chuck Hint, MD;  Location: [facility] OR;  Service: Vascular;  Laterality: Left;    Medications: Prior to Admission medications    Medication Sig Start Date End Date Taking? Authorizing Provider  aspirin EC 81 MG tablet Take 81 mg by mouth daily.   Yes Historical Provider, MD  carvedilol (COREG) 6.25 MG tablet take 1 tablet by mouth twice a day 03/29/13  Yes Chrystie Nose, MD  insulin aspart (NOVOLOG) 100 UNIT/ML injection Inject 4 Units into the skin 2 (two) times daily.   Yes Historical Provider, MD  insulin glargine (LANTUS) 100 UNIT/ML injection Inject 14 Units into the skin at bedtime.   Yes Historical Provider, MD  multivitamin (RENA-VIT) TABS tablet Take 1 tablet by mouth daily.   Yes Historical Provider, MD  pravastatin (PRAVACHOL) 20 MG tablet Take 20 mg by mouth at bedtime.    Yes Historical Provider, MD  glucose blood (ONETOUCH VERIO) test strip Use as instructed to check blood sugar 4 times per day dx code 250.00 09/16/13   [name], MD  [facility] DELICA LANCETS FINE MISC Use to check blood sugar 4 times per day 09/16/13   [name], MD    Allergies:  No Known Allergies  Social History:  reports that she has never smoked. She has never used smokeless tobacco. She reports that she does not drink alcohol or use illicit drugs.  Family History: Family History  Problem Relation Age of Onset  . Hyperlipidemia Mother   . Hypertension Mother   . Hodgkin's lymphoma Father   . Heart disease Sister     Physical Exam: Blood pressure 153/72, pulse 92, temperature 98.6 F (37 C), temperature source Oral, resp. rate 23, height 5\' 3"  (1.6 m), weight 37.649 kg (83 lb), SpO2 100.00%. General: Alert, awake, oriented x3, in no acute distress, no significant dysarthria. HEENT: normocephalic, atraumatic, anicteric sclera, pink conjunctiva, pupils equal and reactive to light and accomodation, oropharynx clear Neck: supple, no masses or lymphadenopathy, no goiter, no bruits  Heart: Regular rate and rhythm, without murmurs, rubs or gallops. Lungs: Clear to auscultation bilaterally, no wheezing, rales or  rhonchi. Abdomen: Soft, nontender, nondistended, positive bowel sounds, no masses. Extremities: No clubbing, cyanosis or edema with positive pedal pulses. Neuro: Alert and oriented x3, + facial drooping, right upper and lower extremities 5/5 strength. LUE 4/5, LLE 1/5 Psych: alert and oriented x 3, normal mood and affect Skin: Abrasions on the left arm and left leg   LABS on Admission:  Basic Metabolic Panel:  Recent Labs Lab 10/17/13 1133  NA 139  K 3.8  CL 94*  CO2 20  GLUCOSE 222*  BUN 47*  CREATININE 4.09*  CALCIUM 9.0   Liver Function Tests: No results found for this basename: AST, ALT, ALKPHOS, BILITOT, PROT, ALBUMIN,  in the last 168 hours No results found for this basename: LIPASE, AMYLASE,  in the last 168 hours No results found for this basename: AMMONIA,  in the last 168 hours CBC:  Recent Labs Lab 10/17/13 1133  WBC 15.7*  NEUTROABS 14.5*  HGB 13.8  HCT 43.7  MCV 84.2  PLT 122*   Cardiac Enzymes: No results found for this basename: CKTOTAL, CKMB, CKMBINDEX, TROPONINI,  in the last 168 hours BNP: No components found with this basename: POCBNP,  CBG: No results found for this basename: GLUCAP,  in the last 168 hours   Radiological Exams on Admission: Ct Head  Wo Contrast  10/17/2013   CLINICAL DATA:  Onset of left leg weakness since yesterday  EXAM: CT HEAD WITHOUT CONTRAST  TECHNIQUE: Contiguous axial images were obtained from the base of the skull through the vertex without intravenous contrast.  COMPARISON:  Noncontrast CT scan of the brain of August 19, 2013  FINDINGS: There is mild stable age appropriate diffuse cerebral and cerebellar atrophy with mild compensatory ventriculomegaly. There are stable punctate basal ganglia calcifications bilaterally. There is no evidence of acute ischemic change. The cerebellum and brainstem are normal.  There is mucoperiosteal thickening within theleft maxillary sinus and a the observed paranasal sinuses elsewhere are  clear. The mastoid air cells are well pneumatized. There is no lytic or blastic skull lesion and no evidence of an acute fracture. Left sphenoid sinus cell.  IMPRESSION: 1. There is no acute intracranial hemorrhage nor acute ischemic change. 2. There are mild stable diffuse atrophic changes. 3. There are inflammatory changes within the left maxillary and left sphenoid sinus cells.   Electronically Signed   By: David  [place]   On: 10/17/2013 10:46    Assessment/Plan Principal Problem:   Left leg weakness: Patient denies any history of CVA, she is on aspirin 81 mg daily, most likely symptoms from CVA - Patient admitted for stroke workup, obtain MRI/MRA brain, 2-D echo, carotid Dopplers for further workup - N.p.o. until swallow testing, patient has been given aspirin in ED - Obtain lipid panel, hemoglobin A1c - PT, OT evaluation - Neurology has been consulted by the EP, Dr Loretha Stapler - Continue aspirin PR/PO, statin po if pt passes swallow screen  Active Problems:   HLD (hyperlipidemia) - Obtain lipid panel, continue statin    Type II or unspecified type diabetes mellitus with peripheral circulatory disorders, uncontrolled(250.72) - Obtain hemoglobin A1c, continue Lantus, placed on sliding scale insulin    UTI (urinary tract infection) - Obtain urine culture and sensitivities, placed on IV Rocephin    ESRD on hemodialysis, TTS - Renal notified, needs hemodialysis today  DVT prophylaxis: Lovenox  CODE STATUS: Full code  Family Communication: Admission, patients condition and plan of care including tests being ordered have been discussed with the patient who indicates understanding and agree with the plan and Code Status   Further plan will depend as patient's clinical course evolves and further radiologic and laboratory data become available.   Time Spent on Admission: 1 hour  RAI,RIPUDEEP M.D. [facility] 10/17/2013, 2:29 PM Pager: [phone]  If 7PM-7AM, please contact  night-coverage [email or web address] Password [email or web address]  **Disclaimer: This note was dictated with voice recognition software. Similar sounding words can inadvertently be transcribed and this note may contain transcription errors which may not have been corrected upon publication of note.**

## 2013-10-17 NOTE — Consult Note (Signed)
Referring Physician: Rai    Chief Complaint: left leg weakness  HPI:                                                                                                                                         Elizabeth [name] is an 68 y.o. female who noted on Tuesday morning she could not move her left leg.  She did not seek medical attention but got around her apartment community using furniture and "shopping carts that are in her apartment community".  Tuesday night she went to stand up off the couch and fell to the ground.  She stayed on the floor all day Wednesday into Thursday and finally called for assistance. Currently she still has significant left leg weakness.   On arrival to ED she was also found to have UTI  She states she was on ASA daily and took it every day  Date last known well: Date: 10/15/2013 Time last known well: Unable to determine tPA Given: No: out of window  Past Medical History  Diagnosis Date  . Hyperlipidemia   . Urinary retention     DX NEUROGENIC BLADDER--HAS INDWELLING FOLEY CATHETER  . Arthritis   . Anemia   . CAD (coronary artery disease)   . Neuropathic pain   . Shortness of breath     WITH EXERTION  . Stroke     patient not aware.  . Foley catheter in place     for urinary retention  . Critical lower limb ischemia   . Hypertension   . Peripheral vascular disease   . Hypoglycemia 08/19/2013  . Hypokalemia 08/19/2013  . Chronic kidney disease     PT TOLD RECENT TESTING SHOWS KIDNEY DAMAGE--IS SEEING NEPHROLOGIST.  OFFICE NOTES OBTAINED DR. MATTINGLY -"CHRONIC KIDNEY DISEASE 4 MOST LIKELY SECONDARY TO DIABETES"  . Diabetes mellitus without complication     TAKES INSULIN     Past Surgical History  Procedure Laterality Date  . No previous surgery    . Insertion of suprapubic catheter N/A 05/16/2012    Procedure: INSERTION OF SUPRAPUBIC CATHETER;  Surgeon: Sebastian Ache, MD;  Location: [facility];  Service: Urology;  Laterality: N/A;  . Bascilic vein  transposition Left 10/30/2012    Procedure: LEFT 1ST STAGE BASCILIC VEIN TRANSPOSITION;  Surgeon: Chuck Hint, MD;  Location[facility] OR;  Service: Vascular;  Laterality: Left;  . Nm myocar perf wall motion  04/19/2012    low risk study-EF 44%,mild inferolateral hypkinesis  . Bascilic vein transposition Left 02/05/2013    Procedure: BASCILIC VEIN TRANSPOSITION 2ND STAGE;  Surgeon: Chuck Hint, MD;  Location: [facility] OR;  Service: Vascular;  Laterality: Left;    Family History  Problem Relation Age of Onset  . Hyperlipidemia Mother   . Hypertension Mother   . Hodgkin's lymphoma Father   . Heart disease Sister    Social History:  reports that  she has never smoked. She has never used smokeless tobacco. She reports that she does not drink alcohol or use illicit drugs.  Allergies: No Known Allergies  Medications:                                                                                                                           No current facility-administered medications for this encounter.   Current Outpatient Prescriptions  Medication Sig Dispense Refill  . aspirin EC 81 MG tablet Take 81 mg by mouth daily.      . carvedilol (COREG) 6.25 MG tablet take 1 tablet by mouth twice a day  60 tablet  8  . insulin aspart (NOVOLOG) 100 UNIT/ML injection Inject 4 Units into the skin 2 (two) times daily.      . insulin glargine (LANTUS) 100 UNIT/ML injection Inject 14 Units into the skin at bedtime.      . multivitamin (RENA-VIT) TABS tablet Take 1 tablet by mouth daily.      . pravastatin (PRAVACHOL) 20 MG tablet Take 20 mg by mouth at bedtime.       Marland Kitchen glucose blood (ONETOUCH VERIO) test strip Use as instructed to check blood sugar 4 times per day dx code 250.00  150 each  3  . ONETOUCH DELICA LANCETS FINE MISC Use to check blood sugar 4 times per day  200 each  3     ROS:                                                                                                                                        History obtained from the patient  General ROS: negative for - chills, fatigue, fever, night sweats, weight gain or weight loss Psychological ROS: negative for - behavioral disorder, hallucinations, memory difficulties, mood swings or suicidal ideation Ophthalmic ROS: negative for - blurry vision, double vision, eye pain or loss of vision ENT ROS: negative for - epistaxis, nasal discharge, oral lesions, sore throat, tinnitus or vertigo Allergy and Immunology ROS: negative for - hives or itchy/watery eyes Hematological and Lymphatic ROS: negative for - bleeding problems, bruising or swollen lymph nodes Endocrine ROS: negative for - galactorrhea, hair pattern changes, polydipsia/polyuria or temperature intolerance Respiratory ROS: negative for - cough, hemoptysis, shortness of breath or wheezing Cardiovascular ROS: negative for - chest pain, dyspnea on exertion, edema or irregular heartbeat  Gastrointestinal ROS: negative for - abdominal pain, diarrhea, hematemesis, nausea/vomiting or stool incontinence Genito-Urinary ROS: negative for - dysuria, hematuria, incontinence or urinary frequency/urgency Musculoskeletal ROS: negative for - joint swelling or muscular weakness Neurological ROS: as noted in HPI Dermatological ROS: negative for rash and skin lesion changes  Neurologic Examination:                                                                                                      Blood pressure 148/74, pulse 89, temperature 98.9 F (37.2 C), temperature source Rectal, resp. rate 20, height 5\' 3"  (1.6 m), weight 37.649 kg (83 lb), SpO2 100.00%.   General: NAD Mental Status: Alert, oriented, thought content appropriate.  Speech fluent without evidence of aphasia.  Able to follow 3 step commands without difficulty. Cranial Nerves: II: Discs flat bilaterally; Visual fields grossly normal, pupils equal, round, reactive to light and accommodation III,IV, VI: ptosis  not present, extra-ocular motions intact bilaterally V,VII: smile symmetric, facial light touch sensation normal bilaterally VIII: hearing normal bilaterally IX,X: gag reflex present XI: bilateral shoulder shrug XII: midline tongue extension without atrophy or fasciculations  Motor: Right : Upper extremity   5/5    Left:     Upper extremity   5/5  Lower extremity   5/5     Lower extremity   2 proximal and 0/5 distal Tone and bulk:normal tone throughout; no atrophy noted Sensory: Pinprick and light touch decreased on the left leg Deep Tendon Reflexes:  Right: Upper Extremity   Left: Upper extremity   biceps (C-5 to C-6) 2/4   biceps (C-5 to C-6) 2/4 tricep (C7) 2/4    triceps (C7) 2/4 Brachioradialis (C6) 2/4  Brachioradialis (C6) 2/4  Lower Extremity Lower Extremity  quadriceps (L-2 to L-4) 2/4   quadriceps (L-2 to L-4) 2/4 Achilles (S1) 0/4   Achilles (S1) 0/4  Plantars: Right: downgoing   Left: up going Cerebellar: normal finger-to-nose,  normal heel-to-shin test with right leg Gait: not tested CV: pulses palpable throughout    Lab Results: Basic Metabolic Panel:  Recent Labs Lab 10/17/13 1133  NA 139  K 3.8  CL 94*  CO2 20  GLUCOSE 222*  BUN 47*  CREATININE 4.09*  CALCIUM 9.0    Liver Function Tests: No results found for this basename: AST, ALT, ALKPHOS, BILITOT, PROT, ALBUMIN,  in the last 168 hours No results found for this basename: LIPASE, AMYLASE,  in the last 168 hours No results found for this basename: AMMONIA,  in the last 168 hours  CBC:  Recent Labs Lab 10/17/13 1133  WBC 15.7*  NEUTROABS 14.5*  HGB 13.8  HCT 43.7  MCV 84.2  PLT 122*    Cardiac Enzymes: No results found for this basename: CKTOTAL, CKMB, CKMBINDEX, TROPONINI,  in the last 168 hours  Lipid Panel: No results found for this basename: CHOL, TRIG, HDL, CHOLHDL, VLDL, LDLCALC,  in the last 168 hours  CBG: No results found for this basename: GLUCAP,  in the last 168  hours  Microbiology: Results for orders placed during the hospital  encounter of 05/14/12  SURGICAL PCR SCREEN     Status: Abnormal   Collection Time    05/14/12 11:15 AM      Result Value Ref Range Status   MRSA, PCR NEGATIVE  NEGATIVE Final   Staphylococcus aureus POSITIVE (*) NEGATIVE Final   Comment:            The Xpert SA Assay (FDA     approved for NASAL specimens     in patients over 60 years of age),     is one component of     a comprehensive surveillance     program.  Test performance has     been validated by [employer] for patients greater     than or equal to 61 year old.     It is not intended     to diagnose infection nor to     guide or monitor treatment.    Coagulation Studies: No results found for this basename: LABPROT, INR,  in the last 72 hours  Imaging: Ct Head Wo Contrast  10/17/2013   CLINICAL DATA:  Onset of left leg weakness since yesterday  EXAM: CT HEAD WITHOUT CONTRAST  TECHNIQUE: Contiguous axial images were obtained from the base of the skull through the vertex without intravenous contrast.  COMPARISON:  Noncontrast CT scan of the brain of August 19, 2013  FINDINGS: There is mild stable age appropriate diffuse cerebral and cerebellar atrophy with mild compensatory ventriculomegaly. There are stable punctate basal ganglia calcifications bilaterally. There is no evidence of acute ischemic change. The cerebellum and brainstem are normal.  There is mucoperiosteal thickening within theleft maxillary sinus and a the observed paranasal sinuses elsewhere are clear. The mastoid air cells are well pneumatized. There is no lytic or blastic skull lesion and no evidence of an acute fracture. Left sphenoid sinus cell.  IMPRESSION: 1. There is no acute intracranial hemorrhage nor acute ischemic change. 2. There are mild stable diffuse atrophic changes. 3. There are inflammatory changes within the left maxillary and left sphenoid sinus cells.   Electronically Signed    By: David  [place]   On: 10/17/2013 10:46     Felicie Morn PA-C [facility] [phone]  10/17/2013, 4:25 PM  Patient seen and examined.  Clinical course and management discussed.  Necessary edits performed.  I agree with the above.  Assessment and plan of care developed and discussed below.     Assessment: 69 y.o. female with new onset left leg weakness that occurred > 24 hours ago.  Exam shows left leg weakness and numbness.  Head CT reviewed and shows no acute changes.  Patient on ASA at home.  She has multiple vascular risk factors.  Further investigation recommended to rule out stroke.     Stroke Risk Factors - diabetes mellitus, hyperlipidemia and hypertension  1. HgbA1c, fasting lipid panel 2. MRI, MRA  of the brain without contrast 3. PT consult, OT consult, Speech consult 4. Echocardiogram 5. Carotid dopplers 6. Prophylactic therapy-Antiplatelet med: Aspirin - dose 325 mg daily 7. Risk factor modification 8. Telemetry monitoring 9. Frequent neuro checks    Thana Farr, MD [facility] [phone]  10/17/2013  4:51 PM

## 2013-10-18 ENCOUNTER — Inpatient Hospital Stay (HOSPITAL_COMMUNITY): Payer: Medicare Other

## 2013-10-18 LAB — GLUCOSE, CAPILLARY
GLUCOSE-CAPILLARY: 105 mg/dL — AB (ref 70–99)
GLUCOSE-CAPILLARY: 111 mg/dL — AB (ref 70–99)
GLUCOSE-CAPILLARY: 249 mg/dL — AB (ref 70–99)
Glucose-Capillary: 157 mg/dL — ABNORMAL HIGH (ref 70–99)
Glucose-Capillary: 205 mg/dL — ABNORMAL HIGH (ref 70–99)
Glucose-Capillary: 221 mg/dL — ABNORMAL HIGH (ref 70–99)
Glucose-Capillary: 265 mg/dL — ABNORMAL HIGH (ref 70–99)

## 2013-10-18 LAB — HEMOGLOBIN A1C
HEMOGLOBIN A1C: 7.3 % — AB (ref <5.7)
MEAN PLASMA GLUCOSE: 163 mg/dL — AB (ref <117)

## 2013-10-18 LAB — URINE CULTURE

## 2013-10-18 LAB — LIPID PANEL
Cholesterol: 210 mg/dL — ABNORMAL HIGH (ref 0–200)
HDL: 85 mg/dL (ref >39)
LDL Cholesterol: 106 mg/dL — ABNORMAL HIGH (ref 0–99)
TRIGLYCERIDES: 96 mg/dL (ref <150)
Total CHOL/HDL Ratio: 2.5 RATIO
VLDL: 19 mg/dL (ref 0–40)

## 2013-10-18 MED ORDER — CALCIUM ACETATE (PHOS BINDER) 667 MG/5ML PO SOLN
1334.0000 mg | Freq: Three times a day (TID) | ORAL | Status: DC
  Administered 2013-10-18 – 2013-10-19 (×2): 1334 mg via ORAL
  Filled 2013-10-18 (×11): qty 10

## 2013-10-18 MED ORDER — ENOXAPARIN SODIUM 30 MG/0.3ML ~~LOC~~ SOLN
20.0000 mg | SUBCUTANEOUS | Status: DC
  Administered 2013-10-18 – 2013-10-20 (×3): 20 mg via SUBCUTANEOUS
  Filled 2013-10-18: qty 0.3
  Filled 2013-10-18 (×4): qty 0.2
  Filled 2013-10-18: qty 0.3

## 2013-10-18 NOTE — Progress Notes (Signed)
STROKE TEAM PROGRESS NOTE   HISTORY Elizabeth [name] is an 68 y.o. female who noted on Tuesday 10/15/2013, time unknown in the morning she could not move her left leg. She did not seek medical attention but got around her apartment community using furniture and "shopping carts that are in her apartment community". Tuesday night she went to stand up off the couch and fell to the ground. She stayed on the floor all day Wednesday into Thursday and finally called for assistance. Currently she still has significant left leg weakness.  On arrival to ED she was also found to have UTI  She states she was on ASA daily and took it every day. Patient was not administered TPA secondary to delay in arrival. She was admitted for further evaluation and treatment.   SUBJECTIVE (INTERVAL HISTORY) Her RN and MD is at the bedside.  She informed me that she fell in her kitchen and had to spend the night on the floor and has pain at her tail bone.She has no h/o herniated disc or chronic back pain.   OBJECTIVE Temp:  [97.5 F (36.4 C)-100.8 F (38.2 C)] 99 F (37.2 C) (08/28 1045) Pulse Rate:  [88-117] 99 (08/28 1045) Cardiac Rhythm:  [-] Sinus tachycardia (08/27 2320) Resp:  [15-25] 20 (08/28 1045) BP: (57-148)/(31-76) 127/58 mmHg (08/28 1045) SpO2:  [95 %-100 %] 99 % (08/28 1045) Weight:  [35.5 kg (78 lb 4.2 oz)-37.7 kg (83 lb 1.8 oz)] 35.5 kg (78 lb 4.2 oz) (08/27 2315)   Recent Labs Lab 10/17/13 2142 10/18/13 0024 10/18/13 0422 10/18/13 0821 10/18/13 1130  GLUCAP 114* 111* 265* 105* 157*    Recent Labs Lab 10/17/13 1133 10/17/13 1556  NA 139 140  K 3.8 3.6*  CL 94* 95*  CO2 20 21  GLUCOSE 222* 232*  BUN 47* 52*  CREATININE 4.09* 4.19*  CALCIUM 9.0 8.5  PHOS  --  6.8*    Recent Labs Lab 10/17/13 1556  ALBUMIN 2.4*    Recent Labs Lab 10/17/13 1133  WBC 15.7*  NEUTROABS 14.5*  HGB 13.8  HCT 43.7  MCV 84.2  PLT 122*   No results found for this basename: CKTOTAL, CKMB,  CKMBINDEX, TROPONINI,  in the last 168 hours No results found for this basename: LABPROT, INR,  in the last 72 hours  Recent Labs  10/17/13 1237  COLORURINE AMBER*  LABSPEC 1.015  PHURINE 6.5  GLUCOSEU NEGATIVE  HGBUR LARGE*  BILIRUBINUR MODERATE*  KETONESUR 15*  PROTEINUR >300*  UROBILINOGEN 1.0  NITRITE NEGATIVE  LEUKOCYTESUR LARGE*       Component Value Date/Time   CHOL 210* 10/18/2013 0643   TRIG 96 10/18/2013 0643   HDL 85 10/18/2013 0643   CHOLHDL 2.5 10/18/2013 0643   VLDL 19 10/18/2013 0643   LDLCALC 106* 10/18/2013 0643   Lab Results  Component Value Date   HGBA1C 7.3* 10/18/2013      Component Value Date/Time   LABOPIA NONE DETECTED 08/19/2013 2305   COCAINSCRNUR NONE DETECTED 08/19/2013 2305   LABBENZ NONE DETECTED 08/19/2013 2305   AMPHETMU NONE DETECTED 08/19/2013 2305   THCU NONE DETECTED 08/19/2013 2305   LABBARB NONE DETECTED 08/19/2013 2305    No results found for this basename: ETH,  in the last 168 hours  Dg Chest 2 View  10/18/2013   CLINICAL DATA:  Stroke  EXAM: CHEST  2 VIEW  COMPARISON:  08/19/2013  FINDINGS: Cardiomediastinal silhouette is stable. There is a dual lumen right IJ catheter with tip  in right atrium. No pneumothorax. No acute infiltrate or pulmonary edema. Mild degenerative changes thoracic spine.  IMPRESSION: No active cardiopulmonary disease.   Electronically Signed   By: Natasha Mead M.D.   On: 10/18/2013 08:07   Ct Head Wo Contrast  10/17/2013   CLINICAL DATA:  Onset of left leg weakness since yesterday  EXAM: CT HEAD WITHOUT CONTRAST  TECHNIQUE: Contiguous axial images were obtained from the base of the skull through the vertex without intravenous contrast.  COMPARISON:  Noncontrast CT scan of the brain of August 19, 2013  FINDINGS: There is mild stable age appropriate diffuse cerebral and cerebellar atrophy with mild compensatory ventriculomegaly. There are stable punctate basal ganglia calcifications bilaterally. There is no evidence of acute  ischemic change. The cerebellum and brainstem are normal.  There is mucoperiosteal thickening within theleft maxillary sinus and a the observed paranasal sinuses elsewhere are clear. The mastoid air cells are well pneumatized. There is no lytic or blastic skull lesion and no evidence of an acute fracture. Left sphenoid sinus cell.  IMPRESSION: 1. There is no acute intracranial hemorrhage nor acute ischemic change. 2. There are mild stable diffuse atrophic changes. 3. There are inflammatory changes within the left maxillary and left sphenoid sinus cells.   Electronically Signed   By: David  [place]   On: 10/17/2013 10:46   Mr Brain Wo Contrast  10/18/2013   CLINICAL DATA:  Stroke. Hypertension, diabetes, hyperlipidemia. ESRD  EXAM: MRI HEAD WITHOUT CONTRAST  MRA HEAD WITHOUT CONTRAST  TECHNIQUE: Multiplanar, multiecho pulse sequences of the brain and surrounding structures were obtained without intravenous contrast. Angiographic images of the head were obtained using MRA technique without contrast.  COMPARISON:  CT 10/17/2013  FINDINGS: MRI HEAD FINDINGS  Negative for acute infarct.  Mild atrophy. Minimal chronic microvascular ischemic change in the white matter. Small right frontal white matter infarct. Chronic micro hemorrhage right occipital lobe and left frontal lobe, most likely due to chronic hypertension. Negative for mass or edema.  Mucosal edema left maxillary sinus.  MRA HEAD FINDINGS  Both vertebral arteries are normal. PICA, AICA, superior cerebellar, and posterior cerebral arteries are widely patent and appear normal bilaterally  Internal carotid artery is normal bilaterally. Anterior and middle cerebral arteries are normal bilaterally  Negative for cerebral aneurysm.  IMPRESSION: Negative for acute infarct.  Negative MRA head   Electronically Signed   By: Marlan Palau M.D.   On: 10/18/2013 08:23   Mr Maxine Glenn Head/brain Wo Cm  10/18/2013   CLINICAL DATA:  Stroke. Hypertension, diabetes,  hyperlipidemia. ESRD  EXAM: MRI HEAD WITHOUT CONTRAST  MRA HEAD WITHOUT CONTRAST  TECHNIQUE: Multiplanar, multiecho pulse sequences of the brain and surrounding structures were obtained without intravenous contrast. Angiographic images of the head were obtained using MRA technique without contrast.  COMPARISON:  CT 10/17/2013  FINDINGS: MRI HEAD FINDINGS  Negative for acute infarct.  Mild atrophy. Minimal chronic microvascular ischemic change in the white matter. Small right frontal white matter infarct. Chronic micro hemorrhage right occipital lobe and left frontal lobe, most likely due to chronic hypertension. Negative for mass or edema.  Mucosal edema left maxillary sinus.  MRA HEAD FINDINGS  Both vertebral arteries are normal. PICA, AICA, superior cerebellar, and posterior cerebral arteries are widely patent and appear normal bilaterally  Internal carotid artery is normal bilaterally. Anterior and middle cerebral arteries are normal bilaterally  Negative for cerebral aneurysm.  IMPRESSION: Negative for acute infarct.  Negative MRA head   Electronically Signed  By: Marlan Palau M.D.   On: 10/18/2013 08:23    PHYSICAL EXAM Frail middle aged lady not in distress.Awake alert. Afebrile. Head is nontraumatic. Neck is supple without bruit. Hearing is normal. Cardiac exam no murmur or gallop. Lungs are clear to auscultation. Distal pulses are well felt. Neurological Exam :  Awake  Alert oriented x 3. Normal speech and language.eye movements full without nystagmus.fundi were not visualized. Vision acuity and fields appear normal. Hearing is normal. Palatal movements are normal. Face symmetric. Tongue midline. LLE 1/5 strength. Normal strength other extremities.Pain on passive movement of left leg.. Normal sensation. Gait deferred. ASSESSMENT/PLAN :  Elizabeth [name] is a 68 y.o. female with hx of diabetes, hyperlipidemia, hypertension, ESRD on hemodialysis, TTS  presenting with L leg weakness. She did  not receive IV t-PA due to delay in arrival. Imaging confirms no acute infarct.   L leg weakness in setting of back pain, No stroke  MRI negative for stroke  MRA negative  Cancel 2D Echo, Carotid   Check MR L spine  Will hand patient over to neurohospitalists for follow up  Hospital day # 1  Elizabeth Main, MSN, RN, ANVP-BC, ANP-BC, [facility] Pager: [phone] 10/18/2013 11:59 AM     To contact Stroke Continuity provider, please refer to [email or web address]. After hours, contact General Neurology

## 2013-10-18 NOTE — Evaluation (Signed)
Physical Therapy Evaluation Patient Details Name: Elizabeth [name] MRN: [id number] DOB: [birth year] Today's Date: 10/18/2013   History of Present Illness  Elizabeth [name] is an 68 y.o. female who noted on Tuesday morning she could not move her left leg.  She did not seek medical attention but got around her apartment community using furniture and "shopping carts that are in her apartment community".  Tuesday night she went to stand up off the couch and fell to the ground.  She stayed on the floor all day Wednesday into Thursday and finally called for assistance. Currently she still has significant left leg weakness. MRI negative for acute CVA, + for UTI.   Clinical Impression  Pt presents with decreased strength in LLE, generalized weakness overall and also decreased balance.  Pt presents very much with hemiplegic looking L LE, however preliminary reports of MRI show no acute infarct.  Note decreased sensation in LLE, decreased proprioception, however she was able to advance LLE during gait.  No buckling noted, but she compensates with hyperextension.  Pt will benefit from continued acute services to address deficits and PT recommends CIR for follow up to increase pts independence prior to D/C home.     Follow Up Recommendations CIR;Supervision/Assistance - 24 hour    Equipment Recommendations  Other (comment) (TBD)    Recommendations for Other Services Rehab consult     Precautions / Restrictions Precautions Precautions: Fall Precaution Comments: generalized weakness, LLE weaker, watch for hyperextension Restrictions Weight Bearing Restrictions: No      Mobility  Bed Mobility Overal bed mobility: Needs Assistance Bed Mobility: Rolling;Sidelying to Sit Rolling: Min assist   Supine to sit: Mod assist     General bed mobility comments: Had pt perform bed mobility with HOB flat and without rails to better simulate home environment.  Note pt unable to problem solve how to get to EOB,  therefore provided max A cues for rolling with hip flex and then elevating trunk.  Requires assist for LLE and for trunk to sit up.   Transfers Overall transfer level: Needs assistance Equipment used: None Transfers: Sit to/from Stand Sit to Stand: Mod assist         General transfer comment: Pt with good demonstration of forward weight shift with cues for scooting to EOB prior to stand.  Did not note any buckle of LLE, however did block for safety, and did note hyperextension throughout L stance.   Ambulation/Gait Ambulation/Gait assistance: Mod assist;Max assist Ambulation Distance (Feet): 20 Feet (then another 54' w/ RW) Assistive device: None;Rolling walker (2 wheeled) Gait Pattern/deviations: Step-to pattern;Decreased step length - right;Decreased stance time - left;Decreased stride length;Trunk flexed;Narrow base of support Gait velocity: decreased   General Gait Details: Pt with very decreased step lengths bilaterally and fear of placing weight on LLE.  Note retracted hip on LLE with forward flexed posture.  Cues and facilitation for increased glute activation and assist at knee to prevent hyperextension.  Also trialed use of RW for short distance with pt requiring mod A, but note same deficits.  Pt able to advance LLE on her own.   Stairs            Wheelchair Mobility    Modified Rankin (Stroke Patients Only)       Balance Overall balance assessment: Needs assistance Sitting-balance support: Feet supported;No upper extremity supported Sitting balance-Leahy Scale: Fair     Standing balance support: No upper extremity supported;During functional activity Standing balance-Leahy Scale: Poor Standing balance comment: Pt requires  mod A to stand without RW                             Pertinent Vitals/Pain Pain Assessment: 0-10 Pain Score: 5  Pain Location: sacrum Pain Descriptors / Indicators: Aching;Sore Pain Intervention(s): Monitored during session  (RN aware)    Home Living Family/patient expects to be discharged to:: Private residence Living Arrangements: Alone Available Help at Discharge: Family;Available PRN/intermittently Type of Home: Apartment Home Access: Elevator     Home Layout: One level Home Equipment: Grab bars - tub/shower Additional Comments: has tub/shower unit, standard height commode    Prior Function Level of Independence: Independent               Hand Dominance        Extremity/Trunk Assessment   Upper Extremity Assessment: Defer to OT evaluation           Lower Extremity Assessment: RLE deficits/detail RLE Deficits / Details: hip flex 2-/5, knee ext 2-/5, knee flex 2-/5, ankle DF/PF 0/5    Cervical / Trunk Assessment: Kyphotic  Communication   Communication: No difficulties  Cognition Arousal/Alertness: Awake/alert Behavior During Therapy: WFL for tasks assessed/performed Overall Cognitive Status: Within Functional Limits for tasks assessed                      General Comments General comments (skin integrity, edema, etc.): Pt has scabbing on both knees due to crawling for phone after fall at home.  She appears very malnurished as well.     Exercises        Assessment/Plan    PT Assessment Patient needs continued PT services  PT Diagnosis Difficulty walking;Generalized weakness;Acute pain   PT Problem List Decreased strength;Decreased activity tolerance;Decreased balance;Decreased mobility;Decreased coordination;Decreased knowledge of use of DME;Decreased knowledge of precautions;Decreased skin integrity;Pain  PT Treatment Interventions DME instruction;Gait training;Functional mobility training;Therapeutic activities;Therapeutic exercise;Balance training;Neuromuscular re-education;Patient/family education   PT Goals (Current goals can be found in the Care Plan section) Acute Rehab PT Goals Patient Stated Goal: to get my strength back PT Goal Formulation: With  patient Time For Goal Achievement: 11/01/13 Potential to Achieve Goals: Good    Frequency Min 4X/week   Barriers to discharge Decreased caregiver support      Co-evaluation               End of Session Equipment Utilized During Treatment: Gait belt Activity Tolerance: Patient limited by fatigue;Patient tolerated treatment well Patient left: in chair;with call bell/phone within reach Nurse Communication: Mobility status         Time: 1224-4975 PT Time Calculation (min): 36 min   Charges:   PT Evaluation $Initial PT Evaluation Tier I: 1 Procedure PT Treatments $Gait Training: 23-37 mins   PT G Codes:          Vista Deck 10/18/2013, 10:02 AM

## 2013-10-18 NOTE — Progress Notes (Signed)
Patient having intermittent periods of heart rate reaching between 130's and 140's. Notified via CCMD. Patient asymptomatic. On-call MD made aware, advised to keep a close eye on patient. Will continue to monitor patient closely. Monia Pouch, RN

## 2013-10-18 NOTE — Progress Notes (Signed)
Utilization review completed. Mionna Advincula, RN, BSN. 

## 2013-10-18 NOTE — Progress Notes (Signed)
Rehab Admissions Coordinator Note:  Patient was screened by Clois Dupes for appropriateness for an Inpatient Acute Rehab Consult per PT recommendation.  At this time, we are recommending await further therapy progress and medial workup before determining rehab venue options. I will follow.Clois Dupes 10/18/2013, 11:26 AM  I can be reached at [phone].

## 2013-10-18 NOTE — Progress Notes (Signed)
SLP Cancellation Note  Patient Details Name: Elizabeth [name] MRN: [id number] DOB: Apr 24, 1945   Cancelled treatment:       Reason Eval/Treat Not Completed: SLP screened, no needs identified, will sign off   [name] Laurice 10/18/2013, 3:37 PM

## 2013-10-18 NOTE — Progress Notes (Signed)
[facility] PROGRESS NOTE  Elizabeth [name] ANV:[id number] DOB: Aug 29, 1945 DOA: 10/17/2013 PCP: [name], MD  Assessment/Plan: #1 left lower extremity weakness MRI/MRA of the head is negative for acute infarct. Concern for possible disc disease. MRI of the L-spine has been ordered and is pending. Neurology following and appreciate input and recommendations. PT/OT.  #2 end-stage renal disease on hemodialysis Patient has been seen by nephrology the patient underwent hemodialysis last night. Prerenal.  #3 hyperlipidemia Continue statin.  #4 type 2 diabetes Continue Lantus and sliding scale insulin.  #5 urinary tract infection Urine cultures pending. Continue IV Rocephin.  #6 prophylaxis Lovenox for DVT prophylaxis.  Code Status: Full Family Communication: Updated patient. Updated daughter via telephone. Disposition Plan: Home versus methyl medically stable.   Consultants:  Neurology: Dr. Thad Ranger 10/17/2013  Nephrology: Dr. Vida Roller 10/17/2013  Procedures:  MRI/MRA head 10/18/2013.  Antibiotics:  None  HPI/Subjective: Patient with left lower extremity weakness. No other complaints.  Objective: Filed Vitals:   10/18/13 1045  BP: 127/58  Pulse: 99  Temp: 99 F (37.2 C)  Resp: 20    Intake/Output Summary (Last 24 hours) at 10/18/13 1152 Last data filed at 10/18/13 0900  Gross per 24 hour  Intake    360 ml  Output   -153 ml  Net    513 ml   Filed Weights   10/17/13 0935 10/17/13 2143 10/17/13 2315  Weight: 37.649 kg (83 lb) 37.7 kg (83 lb 1.8 oz) 35.5 kg (78 lb 4.2 oz)    Exam:   General:  NAD  Cardiovascular: RRR  Respiratory: CTAB  Abdomen: Soft, nontender, nondistended, positive bowel sounds.  Musculoskeletal: No clubbing cyanosis or edema  Data Reviewed: Basic Metabolic Panel:  Recent Labs Lab 10/17/13 1133 10/17/13 1556  NA 139 140  K 3.8 3.6*  CL 94* 95*  CO2 20 21  GLUCOSE 222* 232*  BUN 47* 52*  CREATININE 4.09* 4.19*   CALCIUM 9.0 8.5  PHOS  --  6.8*   Liver Function Tests:  Recent Labs Lab 10/17/13 1556  ALBUMIN 2.4*   No results found for this basename: LIPASE, AMYLASE,  in the last 168 hours No results found for this basename: AMMONIA,  in the last 168 hours CBC:  Recent Labs Lab 10/17/13 1133  WBC 15.7*  NEUTROABS 14.5*  HGB 13.8  HCT 43.7  MCV 84.2  PLT 122*   Cardiac Enzymes: No results found for this basename: CKTOTAL, CKMB, CKMBINDEX, TROPONINI,  in the last 168 hours BNP (last 3 results) No results found for this basename: PROBNP,  in the last 8760 hours CBG:  Recent Labs Lab 10/17/13 2142 10/18/13 0024 10/18/13 0422 10/18/13 0821 10/18/13 1130  GLUCAP 114* 111* 265* 105* 157*    No results found for this or any previous visit (from the past 240 hour(s)).   Studies: Dg Chest 2 View  10/18/2013   CLINICAL DATA:  Stroke  EXAM: CHEST  2 VIEW  COMPARISON:  08/19/2013  FINDINGS: Cardiomediastinal silhouette is stable. There is a dual lumen right IJ catheter with tip in right atrium. No pneumothorax. No acute infiltrate or pulmonary edema. Mild degenerative changes thoracic spine.  IMPRESSION: No active cardiopulmonary disease.   Electronically Signed   By: Natasha Mead M.D.   On: 10/18/2013 08:07   Ct Head Wo Contrast  10/17/2013   CLINICAL DATA:  Onset of left leg weakness since yesterday  EXAM: CT HEAD WITHOUT CONTRAST  TECHNIQUE: Contiguous axial images were obtained from the base of the  skull through the vertex without intravenous contrast.  COMPARISON:  Noncontrast CT scan of the brain of August 19, 2013  FINDINGS: There is mild stable age appropriate diffuse cerebral and cerebellar atrophy with mild compensatory ventriculomegaly. There are stable punctate basal ganglia calcifications bilaterally. There is no evidence of acute ischemic change. The cerebellum and brainstem are normal.  There is mucoperiosteal thickening within theleft maxillary sinus and a the observed paranasal  sinuses elsewhere are clear. The mastoid air cells are well pneumatized. There is no lytic or blastic skull lesion and no evidence of an acute fracture. Left sphenoid sinus cell.  IMPRESSION: 1. There is no acute intracranial hemorrhage nor acute ischemic change. 2. There are mild stable diffuse atrophic changes. 3. There are inflammatory changes within the left maxillary and left sphenoid sinus cells.   Electronically Signed   By: David  [place]   On: 10/17/2013 10:46   Mr Brain Wo Contrast  10/18/2013   CLINICAL DATA:  Stroke. Hypertension, diabetes, hyperlipidemia. ESRD  EXAM: MRI HEAD WITHOUT CONTRAST  MRA HEAD WITHOUT CONTRAST  TECHNIQUE: Multiplanar, multiecho pulse sequences of the brain and surrounding structures were obtained without intravenous contrast. Angiographic images of the head were obtained using MRA technique without contrast.  COMPARISON:  CT 10/17/2013  FINDINGS: MRI HEAD FINDINGS  Negative for acute infarct.  Mild atrophy. Minimal chronic microvascular ischemic change in the white matter. Small right frontal white matter infarct. Chronic micro hemorrhage right occipital lobe and left frontal lobe, most likely due to chronic hypertension. Negative for mass or edema.  Mucosal edema left maxillary sinus.  MRA HEAD FINDINGS  Both vertebral arteries are normal. PICA, AICA, superior cerebellar, and posterior cerebral arteries are widely patent and appear normal bilaterally  Internal carotid artery is normal bilaterally. Anterior and middle cerebral arteries are normal bilaterally  Negative for cerebral aneurysm.  IMPRESSION: Negative for acute infarct.  Negative MRA head   Electronically Signed   By: Marlan Palau M.D.   On: 10/18/2013 08:23   Mr Maxine Glenn Head/brain Wo Cm  10/18/2013   CLINICAL DATA:  Stroke. Hypertension, diabetes, hyperlipidemia. ESRD  EXAM: MRI HEAD WITHOUT CONTRAST  MRA HEAD WITHOUT CONTRAST  TECHNIQUE: Multiplanar, multiecho pulse sequences of the brain and surrounding  structures were obtained without intravenous contrast. Angiographic images of the head were obtained using MRA technique without contrast.  COMPARISON:  CT 10/17/2013  FINDINGS: MRI HEAD FINDINGS  Negative for acute infarct.  Mild atrophy. Minimal chronic microvascular ischemic change in the white matter. Small right frontal white matter infarct. Chronic micro hemorrhage right occipital lobe and left frontal lobe, most likely due to chronic hypertension. Negative for mass or edema.  Mucosal edema left maxillary sinus.  MRA HEAD FINDINGS  Both vertebral arteries are normal. PICA, AICA, superior cerebellar, and posterior cerebral arteries are widely patent and appear normal bilaterally  Internal carotid artery is normal bilaterally. Anterior and middle cerebral arteries are normal bilaterally  Negative for cerebral aneurysm.  IMPRESSION: Negative for acute infarct.  Negative MRA head   Electronically Signed   By: Marlan Palau M.D.   On: 10/18/2013 08:23    Scheduled Meds: . aspirin  300 mg Rectal Daily   Or  . aspirin  325 mg Oral Daily  . calcium acetate  1,334 mg Oral TID WC  . cefTRIAXone (ROCEPHIN)  IV  1 g Intravenous Q24H  . enoxaparin (LOVENOX) injection  20 mg Subcutaneous Q24H  . ferric gluconate (FERRLECIT/NULECIT) IV  62.5 mg Intravenous Q Thu  .  insulin aspart  0-9 Units Subcutaneous 6 times per day  . insulin glargine  14 Units Subcutaneous QHS  . multivitamin  1 tablet Oral QHS  . simvastatin  10 mg Oral q1800   Continuous Infusions: . sodium chloride 75 mL/hr at 10/18/13 0026    Principal Problem:   Left leg weakness Active Problems:   HLD (hyperlipidemia)   Type II or unspecified type diabetes mellitus with peripheral circulatory disorders, uncontrolled(250.72)   UTI (urinary tract infection)   ESRD on hemodialysis   CVA (cerebral infarction)    Time spent: 35 mins    [facility] MD [facility] Pager [phone]. If 7PM-7AM, please contact night-coverage  at [email or web address], password [facility] 10/18/2013, 11:52 AM  LOS: 1 day

## 2013-10-18 NOTE — Progress Notes (Signed)
Physical medicine and rehabilitation consult requested chart reviewed. Patient with reported left leg weakness and thus far CT of the head MRI and MRA unremarkable. Workup ongoing with MRI of lumbar spine pending. At this time as noted we'll await medical workup to be completed medically and then followup with formal rehabilitation consult if needed.

## 2013-10-19 LAB — RENAL FUNCTION PANEL
ANION GAP: 12 (ref 5–15)
Albumin: 1.7 g/dL — ABNORMAL LOW (ref 3.5–5.2)
BUN: 45 mg/dL — ABNORMAL HIGH (ref 6–23)
CO2: 22 mEq/L (ref 19–32)
Calcium: 7 mg/dL — ABNORMAL LOW (ref 8.4–10.5)
Chloride: 105 mEq/L (ref 96–112)
Creatinine, Ser: 2.73 mg/dL — ABNORMAL HIGH (ref 0.50–1.10)
GFR calc Af Amer: 19 mL/min — ABNORMAL LOW (ref >90)
GFR calc non Af Amer: 17 mL/min — ABNORMAL LOW (ref >90)
GLUCOSE: 183 mg/dL — AB (ref 70–99)
POTASSIUM: 3 meq/L — AB (ref 3.7–5.3)
Phosphorus: 2 mg/dL — ABNORMAL LOW (ref 2.3–4.6)
Sodium: 139 mEq/L (ref 137–147)

## 2013-10-19 LAB — CBC
HCT: 32.9 % — ABNORMAL LOW (ref 36.0–46.0)
HEMOGLOBIN: 10.4 g/dL — AB (ref 12.0–15.0)
MCH: 26.1 pg (ref 26.0–34.0)
MCHC: 31.6 g/dL (ref 30.0–36.0)
MCV: 82.5 fL (ref 78.0–100.0)
Platelets: 99 10*3/uL — ABNORMAL LOW (ref 150–400)
RBC: 3.99 MIL/uL (ref 3.87–5.11)
RDW: 16.8 % — ABNORMAL HIGH (ref 11.5–15.5)
WBC: 11.3 10*3/uL — ABNORMAL HIGH (ref 4.0–10.5)

## 2013-10-19 LAB — GLUCOSE, CAPILLARY
GLUCOSE-CAPILLARY: 174 mg/dL — AB (ref 70–99)
GLUCOSE-CAPILLARY: 199 mg/dL — AB (ref 70–99)
Glucose-Capillary: 124 mg/dL — ABNORMAL HIGH (ref 70–99)
Glucose-Capillary: 69 mg/dL — ABNORMAL LOW (ref 70–99)
Glucose-Capillary: 181 mg/dL — ABNORMAL HIGH (ref 70–99)
Glucose-Capillary: 239 mg/dL — ABNORMAL HIGH (ref 70–99)

## 2013-10-19 MED ORDER — SEVELAMER CARBONATE 800 MG PO TABS
1600.0000 mg | ORAL_TABLET | Freq: Three times a day (TID) | ORAL | Status: DC
  Administered 2013-10-19 – 2013-10-21 (×5): 1600 mg via ORAL
  Filled 2013-10-19 (×6): qty 2

## 2013-10-19 MED ORDER — POTASSIUM CHLORIDE CRYS ER 20 MEQ PO TBCR
40.0000 meq | EXTENDED_RELEASE_TABLET | Freq: Once | ORAL | Status: AC
  Administered 2013-10-19: 40 meq via ORAL
  Filled 2013-10-19: qty 2

## 2013-10-19 MED ORDER — PNEUMOCOCCAL VAC POLYVALENT 25 MCG/0.5ML IJ INJ
0.5000 mL | INJECTION | INTRAMUSCULAR | Status: AC
  Administered 2013-10-21: 0.5 mL via INTRAMUSCULAR
  Filled 2013-10-19: qty 0.5

## 2013-10-19 NOTE — Progress Notes (Addendum)
Occupational Therapy Evaluation Patient Details Name: Elizabeth [name] MRN: [id number] DOB: 1945/10/31 Today's Date: 10/19/2013    History of Present Illness 68 y.o. female who noted on Tuesday morning she could not move her left leg.  She did not seek medical attention but got around her apartment community using furniture and "shopping carts that are in her apartment community".  Tuesday night she went to stand up off the couch and fell to the ground.  She stayed on the floor all day Wednesday into Thursday until she could get to a phone and call for help. Currently she still has significant left leg weakness. MRI negative for acute CVA, + for UTI. ESRD. HD T Th, Sat.    Clinical Impression   PTA, pt lived alone in apt. Pt currently requires Mod A for mobility and ADL and will need to D/C to SNF for rehab. Pt has a brother, but states he has recently been diagnosed with cancer and can not help out. S/p SNF, feel pt is good candidate for AFL. Pt will benefit from skilled OT services to facilitate D/C to next venue due to below deficits.    Follow Up Recommendations  SNF;Supervision/Assistance - 24 hour    Equipment Recommendations  3 in 1 bedside comode;Tub/shower bench    Recommendations for Other Services       Precautions / Restrictions Precautions Precautions: Fall      Mobility Bed Mobility Overal bed mobility: Needs Assistance Bed Mobility: Supine to Sit Rolling: Min assist            Transfers Overall transfer level: Needs assistance Equipment used: 1 person hand held assist Transfers: Sit to/from [employer] Sit to Stand: Min assist Stand pivot transfers: Mod assist       General transfer comment: difficulty advancing LLE during transfer    Balance Overall balance assessment: Needs assistance   Sitting balance-Leahy Scale: Fair     Standing balance support: Bilateral upper extremity supported;During functional activity Standing  balance-Leahy Scale: Poor                              ADL Overall ADL's : Needs assistance/impaired Eating/Feeding: Independent   Grooming: Set up   Upper Body Bathing: Set up   Lower Body Bathing: Moderate assistance;Sit to/from stand   Upper Body Dressing : Set up;Sitting   Lower Body Dressing: Moderate assistance;Sit to/from stand   Toilet Transfer: Moderate assistance;Stand-pivot   Toileting- Clothing Manipulation and Hygiene: Minimal assistance;Sitting/lateral lean Toileting - Clothing Manipulation Details (indicate cue type and reason): Pt has indwelling cath x 1 year. independetly manages     Functional mobility during ADLs: Moderate assistance       Vision      wears reading glasses           Additional Comments: Pt reports she has poor vision at baseline   Perception     Praxis      Pertinent Vitals/Pain Pain Assessment: No/denies pain     Hand Dominance Right   Extremity/Trunk Assessment    Upper extremity - generalized weakness Lower Extremity Assessment Lower Extremity Assessment: Defer to PT evaluation (LLE decreased sensation as compared to R)   Cervical / Trunk Assessment Cervical / Trunk Assessment: Kyphotic   Communication Communication Communication: No difficulties   Cognition Arousal/Alertness: Awake/alert Behavior During Therapy: WFL for tasks assessed/performed Overall Cognitive Status: No family/caregiver present to determine baseline cognitive functioning  General Comments       Exercises       Shoulder Instructions      Home Living Family/patient expects to be discharged to: SNF Living Arrangements: Alone Available Help at Discharge: Family;Available PRN/intermittently Type of Home: Apartment Home Access: Elevator     Home Layout: One level     Bathroom Shower/Tub: Tub/shower unit Shower/tub characteristics: Engineer, building services: Pharmacist, community:   (unsure)   Home Equipment: Grab bars - tub/shower          Prior Functioning/Environment Level of Independence: Independent        Comments: used "shopping carts" tfor mobility if needed (did her own cooking/cleaning)    OT Diagnosis: Generalized weakness;Paresis   OT Problem List: Decreased strength;Decreased range of motion;Decreased activity tolerance;Impaired balance (sitting and/or standing);Decreased safety awareness;Decreased knowledge of use of DME or AE;Impaired sensation   OT Treatment/Interventions: Self-care/ADL training;Therapeutic exercise;DME and/or AE instruction;Therapeutic activities;Patient/family education;Balance training    OT Goals(Current goals can be found in the care plan section) Acute Rehab OT Goals Patient Stated Goal: to be independent aggain OT Goal Formulation: With patient Time For Goal Achievement: 11/02/13 Potential to Achieve Goals: Good  OT Frequency: Min 2X/week   Barriers to D/C: Decreased caregiver support  Has brother who has recently been diagnosed with cancer       Co-evaluation              End of Session Equipment Utilized During Treatment: Gait belt Nurse Communication: Mobility status  Activity Tolerance: Patient tolerated treatment well Patient left: in chair;with call bell/phone within reach;with chair alarm set   Time: [phone] OT Time Calculation (min): 19 min Charges:  OT General Charges $OT Visit: 1 Procedure OT Evaluation $Initial OT Evaluation Tier I: 1 Procedure OT Treatments $Self Care/Home Management : 8-22 mins G-Codes:    Elizabeth [name],Elizabeth [name] November 11, 2013, 10:26 AM   Luisa Dago, OTR/L  [phone] 2013-11-11

## 2013-10-19 NOTE — Progress Notes (Signed)
Subjective:  Feeling better, sitting in chair, but still with left leg weakness  Objective: Vital signs in last 24 hours: Temp:  [97.3 F (36.3 C)-99.1 F (37.3 C)] 98.7 F (37.1 C) (08/29 1123) Pulse Rate:  [91-112] 112 (08/29 1123) Resp:  [17-20] 18 (08/29 1123) BP: (122-155)/(60-77) 122/60 mmHg (08/29 1123) SpO2:  [97 %-100 %] 100 % (08/29 1123) Weight change:   Intake/Output from previous day: 08/28 0701 - 08/29 0700 In: 360 [P.O.:360] Out: 352 [Urine:350; Stool:2] Intake/Output this shift: Total I/O In: 120 [P.O.:120] Out: -   Lab Results:  Recent Labs  10/17/13 1133  WBC 15.7*  HGB 13.8  HCT 43.7  PLT 122*   BMET:  Recent Labs  10/17/13 1133 10/17/13 1556  NA 139 140  K 3.8 3.6*  CL 94* 95*  CO2 20 21  GLUCOSE 222* 232*  BUN 47* 52*  CREATININE 4.09* 4.19*  CALCIUM 9.0 8.5  ALBUMIN  --  2.4*   No results found for this basename: PTH,  in the last 72 hours Iron Studies: No results found for this basename: IRON, TIBC, TRANSFERRIN, FERRITIN,  in the last 72 hours  Studies/Results: Dg Chest 2 View  10/18/2013   CLINICAL DATA:  Stroke  EXAM: CHEST  2 VIEW  COMPARISON:  08/19/2013  FINDINGS: Cardiomediastinal silhouette is stable. There is a dual lumen right IJ catheter with tip in right atrium. No pneumothorax. No acute infiltrate or pulmonary edema. Mild degenerative changes thoracic spine.  IMPRESSION: No active cardiopulmonary disease.   Electronically Signed   By: Natasha Mead M.D.   On: 10/18/2013 08:07   Mr Brain Wo Contrast  10/18/2013   CLINICAL DATA:  Stroke. Hypertension, diabetes, hyperlipidemia. ESRD  EXAM: MRI HEAD WITHOUT CONTRAST  MRA HEAD WITHOUT CONTRAST  TECHNIQUE: Multiplanar, multiecho pulse sequences of the brain and surrounding structures were obtained without intravenous contrast. Angiographic images of the head were obtained using MRA technique without contrast.  COMPARISON:  CT 10/17/2013  FINDINGS: MRI HEAD FINDINGS  Negative for acute  infarct.  Mild atrophy. Minimal chronic microvascular ischemic change in the white matter. Small right frontal white matter infarct. Chronic micro hemorrhage right occipital lobe and left frontal lobe, most likely due to chronic hypertension. Negative for mass or edema.  Mucosal edema left maxillary sinus.  MRA HEAD FINDINGS  Both vertebral arteries are normal. PICA, AICA, superior cerebellar, and posterior cerebral arteries are widely patent and appear normal bilaterally  Internal carotid artery is normal bilaterally. Anterior and middle cerebral arteries are normal bilaterally  Negative for cerebral aneurysm.  IMPRESSION: Negative for acute infarct.  Negative MRA head   Electronically Signed   By: Marlan Palau M.D.   On: 10/18/2013 08:23   Mr Lumbar Spine Wo Contrast  10/18/2013   CLINICAL DATA:  LEFT leg weakness. End-stage renal disease. MRI brain negative for stroke.  EXAM: MRI LUMBAR SPINE WITHOUT CONTRAST  TECHNIQUE: Multiplanar, multisequence MR imaging of the lumbar spine was performed. No intravenous contrast was administered.  COMPARISON:  None.  FINDINGS: Severe alteration of marrow signal related to chronic renal failure. Advanced disc space narrowing at L1-2 through L4-5. Discal hyperintensity at L4-5 likely degenerative in nature although in the appropriate clinical setting, early discitis could have this appearance.  Endplate reactive changes without features to suggest osteomyelitis. Normal conus. Multi-cystic appearing kidneys consistent with end-stage renal disease.  At L1-2 there is a shallow central protrusion without impingement.  At L2-3, there is a shallow central protrusion with mild  facet and ligamentum flavum hypertrophy. There is mild central canal stenosis without impingement.  At L3-4, there is mild annular bulging. The disc is severely narrowed. There is mild facet and ligamentum flavum hypertrophy. There is a large extraforaminal protrusion worse on the LEFT. The LEFT L3 nerve  root is likely displaced. Asymmetric narrowing of the subarticular zone could also affect the LEFT L4 nerve root.  At L4-5, there is severe disc space narrowing. Disc osteophyte complex projects posteriorly resulting in mild central canal stenosis when combined with posterior element hypertrophy. No definite L5 nerve root impingement in the canal. RIGHT-sided neural foraminal narrowing is multifactorial, but likely affects the RIGHT L4 nerve root.  Unremarkable appearing L5-S1 disc space.  No impingement.  IMPRESSION: The patient's LEFT leg weakness and numbness could be partially explained by and extraforaminal spur/protrusion at L3-4 on the LEFT with compressive pathology primarily affecting the L3 nerve root. Correlate clinically.  Multilevel spondylosis as described elsewhere. There are no areas of severe spinal stenosis or cauda equina nerve root compression observed.  Marrow changes consistent with end-stage renal disease. Slight discal hyperintensity at L4-5 likely degenerative, doubt diskitis.   Electronically Signed   By: Davonna Belling M.D.   On: 10/18/2013 21:11   Mr Elizabeth [name] Head/brain Wo Cm  10/18/2013   CLINICAL DATA:  Stroke. Hypertension, diabetes, hyperlipidemia. ESRD  EXAM: MRI HEAD WITHOUT CONTRAST  MRA HEAD WITHOUT CONTRAST  TECHNIQUE: Multiplanar, multiecho pulse sequences of the brain and surrounding structures were obtained without intravenous contrast. Angiographic images of the head were obtained using MRA technique without contrast.  COMPARISON:  CT 10/17/2013  FINDINGS: MRI HEAD FINDINGS  Negative for acute infarct.  Mild atrophy. Minimal chronic microvascular ischemic change in the white matter. Small right frontal white matter infarct. Chronic micro hemorrhage right occipital lobe and left frontal lobe, most likely due to chronic hypertension. Negative for mass or edema.  Mucosal edema left maxillary sinus.  MRA HEAD FINDINGS  Both vertebral arteries are normal. PICA, AICA, superior  cerebellar, and posterior cerebral arteries are widely patent and appear normal bilaterally  Internal carotid artery is normal bilaterally. Anterior and middle cerebral arteries are normal bilaterally  Negative for cerebral aneurysm.  IMPRESSION: Negative for acute infarct.  Negative MRA head   Electronically Signed   By: Marlan Palau M.D.   On: 10/18/2013 08:23   EXAM: General appearance:  Alert, in no apparent distress Resp:  CTA without rales, rhonchi, or wheezes Cardio:  RRR without murmur or rub GI:  + BS, soft and nontender Extremities:  No edema, healing abrasions over both knees Access:  R IJ catheter, AVF @ LUA with + bruit  Dialysis Orders: TTS @ [facility]  3:30 38 kg 2K/2.25Ca 400/A1.5 R IJ catheter, also AVF @ LUA Heparin 3900 U  Hectorol 2 mcg Aranesp 80 mcg & Venofer 50 mg on Thurs   Assessment/Plan: 1. Left extremity weakness - no Hx of CVA, MRI/MRA negative, on ASA qd; extraforaminal spur/protrusion @ L3-4 on L with compressive pathology per MRI. No stroke per neurology. Look for other etiologies.  2. UTI - per UA with culture pending, Foley catheter exchanged, started Rocephin.. 3. ESRD - HD on TTS @ [facility], K 3.8. HD pending. 4. HTN/volume - BP 122/60 on Carvedilol 12.5 mg bid; below EDW. 5. Anemia - Hgb10.4, on Aranesp 80 mcg on hold, weekly Venofer.  1. Metabolic bone disease - Ca 8.5 (9.8 corrected), P 6.8, iPTH 356; Hectorol 2 mcg, Phoslo 2 with meals.  New  Rx was for Renvela, will change. 2. Nutrition - Alb 2.4, renal carb-mod diet, multivitamin. 3. DM - insulin per primary 4. Neurogenic bladder - chronic indwelling Foley catheter, followed by Dr. Kathrynn Running.     LOS: 2 days   LYLES,CHARLES 10/19/2013,11:45 AM  Pt seen, examined, agree w assess/plan as above with additions as indicated.  Vinson Moselle MD pager [phone]    cell [phone] 10/19/2013, 1:06 PM

## 2013-10-19 NOTE — Progress Notes (Signed)
Physical Therapy Treatment Patient Details Name: Elizabeth [name] MRN: [id number] DOB: 17-Apr-1945 Today's Date: 10/19/2013    History of Present Illness 68 y.o. female who noted on Tuesday morning she could not move her left leg.  She did not seek medical attention but got around her apartment community using furniture and "shopping carts that are in her apartment community".  Tuesday night she went to stand up off the couch and fell to the ground.  She stayed on the floor all day Wednesday into Thursday and finally called for assistance. Currently she still has significant left leg weakness. MRI negative for acute CVA, + for UTI.     PT Comments    Pt continues to be very motivated to participate in therapy. Pt from senior living apartments. Pt unsafe to D/C home alone at this time. Will require post-acute rehab due to balance deficits and generalized weakness.   Follow Up Recommendations  CIR;Supervision/Assistance - 24 hour;Other (comment) (if not approved, will need SNF )     Equipment Recommendations  Rolling walker with 5" wheels    Recommendations for Other Services Rehab consult     Precautions / Restrictions Precautions Precautions: Fall Precaution Comments: generalized weakness, LLE weaker, watch for hyperextension Restrictions Weight Bearing Restrictions: No    Mobility  Bed Mobility Overal bed mobility: Needs Assistance Bed Mobility: Sit to Supine       Sit to supine: Min assist   General bed mobility comments: (A) to bring LEs into supine position; pt limited due to pain and weakness; cues for hand placement  Transfers Overall transfer level: Needs assistance Equipment used: Rolling walker (2 wheeled) Transfers: Sit to/from Stand Sit to Stand: Min guard         General transfer comment: min (A) to balance and (A) with powering up; cues for hadn placement and safety with RW   Ambulation/Gait Ambulation/Gait assistance: Min assist Ambulation Distance  (Feet): 14 Feet Assistive device: Rolling walker (2 wheeled) Gait Pattern/deviations: Step-through pattern;Decreased stride length;Shuffle;Narrow base of support;Trunk flexed Gait velocity: decreased Gait velocity interpretation: Below normal speed for age/gender General Gait Details: cues for upright posture and gt sequencing; min (A) to maintain balance and mange RW with directional changes; limited due to pain and fatigue; slight hyperext noted in Lt LE; pt reports this to be her baseline    Careers information officer    Modified Rankin (Stroke Patients Only)       Balance Overall balance assessment: History of Falls;Needs assistance Sitting-balance support: Feet supported Sitting balance-Leahy Scale: Fair     Standing balance support: During functional activity;Bilateral upper extremity supported Standing balance-Leahy Scale: Poor Standing balance comment: requries UE support                     Cognition Arousal/Alertness: Awake/alert Behavior During Therapy: WFL for tasks assessed/performed Overall Cognitive Status: No family/caregiver present to determine baseline cognitive functioning                      Exercises      General Comments General comments (skin integrity, edema, etc.): multiple abraisions on LEs and back       Pertinent Vitals/Pain Pain Assessment: No/denies pain Pain Score: 4  Pain Location: "tailbone"  Pain Descriptors / Indicators: Aching Pain Intervention(s): Limited activity within patient's tolerance;Repositioned    Home Living  Prior Function            PT Goals (current goals can now be found in the care plan section) Acute Rehab PT Goals Patient Stated Goal: to get stronger and walk  PT Goal Formulation: With patient Time For Goal Achievement: 11/01/13 Potential to Achieve Goals: Good Progress towards PT goals: Progressing toward goals    Frequency  Min  4X/week    PT Plan Current plan remains appropriate    Co-evaluation             End of Session Equipment Utilized During Treatment: Gait belt Activity Tolerance: Patient limited by fatigue;Patient limited by pain Patient left: in bed;with call bell/phone within reach;with bed alarm set     Time: 1610-9604 PT Time Calculation (min): 12 min  Charges:  $Gait Training: 8-22 mins                    G CodesDonnamarie Poag Sun,   [phone] 10/19/2013, 4:23 PM

## 2013-10-19 NOTE — Progress Notes (Signed)
[facility] PROGRESS NOTE  Elizabeth [name] ZOX:[id number] DOB: [birth year] DOA: 10/17/2013 PCP: [name], MD  Assessment/Plan: #1 left lower extremity weakness MRI/MRA of the head is negative for acute infarct. Concern for possible disc disease. MRI of the L-spine with extraforaminal spur/protrusion at L3-4 and some left compressive pathology affecting L3 nerve root. Neurology following and appreciate input and recommendations. PT/OT.  #2 end-stage renal disease on hemodialysis Patient has been seen by nephrology. Per renal.  #3 hyperlipidemia Continue statin.  #4 type 2 diabetes Continue Lantus and sliding scale insulin.  #5 urinary tract infection Urine cultures with multiple morphotypes. D/C IV Rocephin.  #6 prophylaxis Lovenox for DVT prophylaxis.  Code Status: Full Family Communication: Updated patient. Updated daughter via telephone. Disposition Plan: Home versus methyl medically stable.   Consultants:  Neurology: Dr. Thad Ranger 10/17/2013  Nephrology: Dr. Arlean Hopping 10/17/2013  Procedures:  MRI/MRA head 10/18/2013.  MRI L spine 10/19/13  Antibiotics:  None  HPI/Subjective: Patient with left lower extremity weakness. No other complaints.  Objective: Filed Vitals:   10/19/13 1123  BP: 122/60  Pulse: 112  Temp: 98.7 F (37.1 C)  Resp: 18    Intake/Output Summary (Last 24 hours) at 10/19/13 1142 Last data filed at 10/19/13 0830  Gross per 24 hour  Intake    120 ml  Output    351 ml  Net   -231 ml   Filed Weights   10/17/13 0935 10/17/13 2143 10/17/13 2315  Weight: 37.649 kg (83 lb) 37.7 kg (83 lb 1.8 oz) 35.5 kg (78 lb 4.2 oz)    Exam:   General:  NAD  Cardiovascular: RRR  Respiratory: CTAB  Abdomen: Soft, nontender, nondistended, positive bowel sounds.  Musculoskeletal: No clubbing cyanosis or edema  Data Reviewed: Basic Metabolic Panel:  Recent Labs Lab 10/17/13 1133 10/17/13 1556  NA 139 140  K 3.8 3.6*  CL 94* 95*  CO2  20 21  GLUCOSE 222* 232*  BUN 47* 52*  CREATININE 4.09* 4.19*  CALCIUM 9.0 8.5  PHOS  --  6.8*   Liver Function Tests:  Recent Labs Lab 10/17/13 1556  ALBUMIN 2.4*   No results found for this basename: LIPASE, AMYLASE,  in the last 168 hours No results found for this basename: AMMONIA,  in the last 168 hours CBC:  Recent Labs Lab 10/17/13 1133  WBC 15.7*  NEUTROABS 14.5*  HGB 13.8  HCT 43.7  MCV 84.2  PLT 122*   Cardiac Enzymes: No results found for this basename: CKTOTAL, CKMB, CKMBINDEX, TROPONINI,  in the last 168 hours BNP (last 3 results) No results found for this basename: PROBNP,  in the last 8760 hours CBG:  Recent Labs Lab 10/18/13 2122 10/18/13 2351 10/19/13 0343 10/19/13 0816 10/19/13 0943  GLUCAP 205* 221* 124* 69* 174*    Recent Results (from the past 240 hour(s))  URINE CULTURE     Status: None   Collection Time    10/17/13 12:37 PM      Result Value Ref Range Status   Specimen Description URINE, RANDOM   Final   Special Requests NONE   Final   Culture  Setup Time     Final   Value: 10/17/2013 15:29     Performed at [facility] Count     Final   Value: >=100,000 COLONIES/ML     Performed at [facility]   Culture     Final   Value: Multiple bacterial morphotypes present, none predominant. Suggest appropriate recollection  if clinically indicated.     Performed at [facility]   Report Status 10/18/2013 FINAL   Final     Studies: Dg Chest 2 View  10/18/2013   CLINICAL DATA:  Stroke  EXAM: CHEST  2 VIEW  COMPARISON:  08/19/2013  FINDINGS: Cardiomediastinal silhouette is stable. There is a dual lumen right IJ catheter with tip in right atrium. No pneumothorax. No acute infiltrate or pulmonary edema. Mild degenerative changes thoracic spine.  IMPRESSION: No active cardiopulmonary disease.   Electronically Signed   By: Natasha Mead M.D.   On: 10/18/2013 08:07   Mr Brain Wo Contrast  10/18/2013   CLINICAL DATA:   Stroke. Hypertension, diabetes, hyperlipidemia. ESRD  EXAM: MRI HEAD WITHOUT CONTRAST  MRA HEAD WITHOUT CONTRAST  TECHNIQUE: Multiplanar, multiecho pulse sequences of the brain and surrounding structures were obtained without intravenous contrast. Angiographic images of the head were obtained using MRA technique without contrast.  COMPARISON:  CT 10/17/2013  FINDINGS: MRI HEAD FINDINGS  Negative for acute infarct.  Mild atrophy. Minimal chronic microvascular ischemic change in the white matter. Small right frontal white matter infarct. Chronic micro hemorrhage right occipital lobe and left frontal lobe, most likely due to chronic hypertension. Negative for mass or edema.  Mucosal edema left maxillary sinus.  MRA HEAD FINDINGS  Both vertebral arteries are normal. PICA, AICA, superior cerebellar, and posterior cerebral arteries are widely patent and appear normal bilaterally  Internal carotid artery is normal bilaterally. Anterior and middle cerebral arteries are normal bilaterally  Negative for cerebral aneurysm.  IMPRESSION: Negative for acute infarct.  Negative MRA head   Electronically Signed   By: Marlan Palau M.D.   On: 10/18/2013 08:23   Mr Lumbar Spine Wo Contrast  10/18/2013   CLINICAL DATA:  LEFT leg weakness. End-stage renal disease. MRI brain negative for stroke.  EXAM: MRI LUMBAR SPINE WITHOUT CONTRAST  TECHNIQUE: Multiplanar, multisequence MR imaging of the lumbar spine was performed. No intravenous contrast was administered.  COMPARISON:  None.  FINDINGS: Severe alteration of marrow signal related to chronic renal failure. Advanced disc space narrowing at L1-2 through L4-5. Discal hyperintensity at L4-5 likely degenerative in nature although in the appropriate clinical setting, early discitis could have this appearance.  Endplate reactive changes without features to suggest osteomyelitis. Normal conus. Multi-cystic appearing kidneys consistent with end-stage renal disease.  At L1-2 there is a  shallow central protrusion without impingement.  At L2-3, there is a shallow central protrusion with mild facet and ligamentum flavum hypertrophy. There is mild central canal stenosis without impingement.  At L3-4, there is mild annular bulging. The disc is severely narrowed. There is mild facet and ligamentum flavum hypertrophy. There is a large extraforaminal protrusion worse on the LEFT. The LEFT L3 nerve root is likely displaced. Asymmetric narrowing of the subarticular zone could also affect the LEFT L4 nerve root.  At L4-5, there is severe disc space narrowing. Disc osteophyte complex projects posteriorly resulting in mild central canal stenosis when combined with posterior element hypertrophy. No definite L5 nerve root impingement in the canal. RIGHT-sided neural foraminal narrowing is multifactorial, but likely affects the RIGHT L4 nerve root.  Unremarkable appearing L5-S1 disc space.  No impingement.  IMPRESSION: The patient's LEFT leg weakness and numbness could be partially explained by and extraforaminal spur/protrusion at L3-4 on the LEFT with compressive pathology primarily affecting the L3 nerve root. Correlate clinically.  Multilevel spondylosis as described elsewhere. There are no areas of severe spinal stenosis or cauda  equina nerve root compression observed.  Marrow changes consistent with end-stage renal disease. Slight discal hyperintensity at L4-5 likely degenerative, doubt diskitis.   Electronically Signed   By: Davonna Belling M.D.   On: 10/18/2013 21:11   Mr Maxine Glenn Head/brain Wo Cm  10/18/2013   CLINICAL DATA:  Stroke. Hypertension, diabetes, hyperlipidemia. ESRD  EXAM: MRI HEAD WITHOUT CONTRAST  MRA HEAD WITHOUT CONTRAST  TECHNIQUE: Multiplanar, multiecho pulse sequences of the brain and surrounding structures were obtained without intravenous contrast. Angiographic images of the head were obtained using MRA technique without contrast.  COMPARISON:  CT 10/17/2013  FINDINGS: MRI HEAD FINDINGS   Negative for acute infarct.  Mild atrophy. Minimal chronic microvascular ischemic change in the white matter. Small right frontal white matter infarct. Chronic micro hemorrhage right occipital lobe and left frontal lobe, most likely due to chronic hypertension. Negative for mass or edema.  Mucosal edema left maxillary sinus.  MRA HEAD FINDINGS  Both vertebral arteries are normal. PICA, AICA, superior cerebellar, and posterior cerebral arteries are widely patent and appear normal bilaterally  Internal carotid artery is normal bilaterally. Anterior and middle cerebral arteries are normal bilaterally  Negative for cerebral aneurysm.  IMPRESSION: Negative for acute infarct.  Negative MRA head   Electronically Signed   By: Marlan Palau M.D.   On: 10/18/2013 08:23    Scheduled Meds: . aspirin  300 mg Rectal Daily   Or  . aspirin  325 mg Oral Daily  . calcium acetate (Phos Binder)  1,334 mg Oral TID WC  . cefTRIAXone (ROCEPHIN)  IV  1 g Intravenous Q24H  . enoxaparin (LOVENOX) injection  20 mg Subcutaneous Q24H  . ferric gluconate (FERRLECIT/NULECIT) IV  62.5 mg Intravenous Q Thu  . insulin aspart  0-9 Units Subcutaneous 6 times per day  . insulin glargine  14 Units Subcutaneous QHS  . multivitamin  1 tablet Oral QHS  . [START ON 10/20/2013] pneumococcal 23 valent vaccine  0.5 mL Intramuscular Tomorrow-1000  . simvastatin  10 mg Oral q1800   Continuous Infusions: . sodium chloride 75 mL/hr (10/18/13 2254)    Principal Problem:   Left leg weakness Active Problems:   HLD (hyperlipidemia)   Type II or unspecified type diabetes mellitus with peripheral circulatory disorders, uncontrolled(250.72)   UTI (urinary tract infection)   ESRD on hemodialysis    Time spent: 35 mins    [facility] MD [facility] Pager [phone]. If 7PM-7AM, please contact night-coverage at [email or web address], password [facility] 10/19/2013, 11:42 AM  LOS: 2 days

## 2013-10-20 ENCOUNTER — Encounter (HOSPITAL_COMMUNITY): Payer: Self-pay | Admitting: *Deleted

## 2013-10-20 LAB — RENAL FUNCTION PANEL
Albumin: 1.6 g/dL — ABNORMAL LOW (ref 3.5–5.2)
Anion gap: 13 (ref 5–15)
BUN: 51 mg/dL — ABNORMAL HIGH (ref 6–23)
CALCIUM: 8 mg/dL — AB (ref 8.4–10.5)
CO2: 21 mEq/L (ref 19–32)
Chloride: 101 mEq/L (ref 96–112)
Creatinine, Ser: 2.85 mg/dL — ABNORMAL HIGH (ref 0.50–1.10)
GFR, EST AFRICAN AMERICAN: 18 mL/min — AB (ref >90)
GFR, EST NON AFRICAN AMERICAN: 16 mL/min — AB (ref >90)
Glucose, Bld: 229 mg/dL — ABNORMAL HIGH (ref 70–99)
PHOSPHORUS: 1.7 mg/dL — AB (ref 2.3–4.6)
Potassium: 3.7 mEq/L (ref 3.7–5.3)
SODIUM: 135 meq/L — AB (ref 137–147)

## 2013-10-20 LAB — GLUCOSE, CAPILLARY
GLUCOSE-CAPILLARY: 103 mg/dL — AB (ref 70–99)
Glucose-Capillary: 137 mg/dL — ABNORMAL HIGH (ref 70–99)
Glucose-Capillary: 153 mg/dL — ABNORMAL HIGH (ref 70–99)
Glucose-Capillary: 212 mg/dL — ABNORMAL HIGH (ref 70–99)
Glucose-Capillary: 250 mg/dL — ABNORMAL HIGH (ref 70–99)

## 2013-10-20 NOTE — Progress Notes (Addendum)
Subjective: Patient much improved today.  Yesterday patient unable to lift leg at all.  Today pain improved and patient able to lift leg off bed with little effort.    Objective: Current vital signs: BP 150/75  Pulse 118  Temp(Src) 99.6 F (37.6 C) (Oral)  Resp 16  Ht  (1.6 m)  Wt 35.5 kg (78 lb 4.2 oz)  BMI 13.87 kg/m2  SpO2 100% Vital signs in last 24 hours: Temp:  [98.6 F (37 C)-99.6 F (37.6 C)] 99.6 F (37.6 C) (08/30 0650) Pulse Rate:  [88-118] 118 (08/30 0650) Resp:  [16-18] 16 (08/30 0650) BP: (122-178)/(57-77) 150/75 mmHg (08/30 0650) SpO2:  [99 %-100 %] 100 % (08/30 0650)  Intake/Output from previous day: 08/29 0701 - 08/30 0700 In: 3821.3 [P.O.:600; I.V.:3121.3; IV Piggyback:100] Out: 400 [Urine:400] Intake/Output this shift:   Nutritional status: Diabetic  Neurologic Exam: Mental Status: Alert, oriented, thought content appropriate.  Speech fluent without evidence of aphasia.  Able to follow 3 step commands without difficulty. Cranial Nerves: II: Discs flat bilaterally; Visual fields grossly normal, pupils equal, round, reactive to light and accommodation III,IV, VI: ptosis not present, extra-ocular motions intact bilaterally V,VII: smile symmetric, facial light touch sensation normal bilaterally VIII: hearing normal bilaterally IX,X: gag reflex present XI: bilateral shoulder shrug XII: midline tongue extension Motor: Right : Upper extremity   5/5    Left:     Upper extremity   5/5  Lower extremity   5/5     Lower extremity   4-/5 with left foot drop Tone and bulk:normal tone throughout; no atrophy noted Sensory: Pinprick and light touch decreased in the LLE Deep Tendon Reflexes: 2+ and symmetric with absent AJ's bilaterally Plantars: Right: downgoing   Left: upgoing   Lab Results: Basic Metabolic Panel:  Recent Labs Lab 10/17/13 1133 10/17/13 1556 10/19/13 1109 10/20/13 0344  NA 139 140 139 135*  K 3.8 3.6* 3.0* 3.7  CL 94* 95* 105  101  CO2 GLUCOSE 222* 232* 183* 229*  BUN 47* 52* 45* 51*  CREATININE 4.09* 4.19* 2.73* 2.85*  CALCIUM 9.0 8.5 7.0* 8.0*  PHOS  --  6.8* 2.0* 1.7*    Liver Function Tests:  Recent Labs Lab 10/17/13 1556 10/19/13 1109 10/20/13 0344  ALBUMIN 2.4* 1.7* 1.6*   No results found for this basename: LIPASE, AMYLASE,  in the last 168 hours No results found for this basename: AMMONIA,  in the last 168 hours  CBC:  Recent Labs Lab 10/17/13 1133 10/19/13 1108  WBC 15.7* 11.3*  NEUTROABS 14.5*  --   HGB 13.8 10.4*  HCT 43.7 32.9*  MCV 84.2 82.5  PLT 122* 99*    Cardiac Enzymes: No results found for this basename: CKTOTAL, CKMB, CKMBINDEX, TROPONINI,  in the last 168 hours  Lipid Panel:  Recent Labs Lab 10/18/13 0643  CHOL 210*  TRIG 96  HDL 85  CHOLHDL 2.5  VLDL 19  LDLCALC 161*    CBG:  Recent Labs Lab 10/19/13 1658 10/19/13 1956 10/20/13 0006 10/20/13 0414 10/20/13 0754  GLUCAP 199* 239* 250* 212* 153*    Microbiology: Results for orders placed during the hospital encounter of 10/17/13  URINE CULTURE     Status: None   Collection Time    10/17/13 12:37 PM      Result Value Ref Range Status   Specimen Description URINE, RANDOM   Final   Special Requests NONE   Final   Culture  Setup  Time     Final   Value: 10/17/2013 15:29     Performed at [facility] Count     Final   Value: >=100,000 COLONIES/ML     Performed at [facility]   Culture     Final   Value: Multiple bacterial morphotypes present, none predominant. Suggest appropriate recollection if clinically indicated.     Performed at [facility]   Report Status 10/18/2013 FINAL   Final    Coagulation Studies: No results found for this basename: LABPROT, INR,  in the last 72 hours  Imaging: Mr Lumbar Spine Wo Contrast  10/18/2013   CLINICAL DATA:  LEFT leg weakness. End-stage renal disease. MRI brain negative for stroke.  EXAM: MRI LUMBAR  SPINE WITHOUT CONTRAST  TECHNIQUE: Multiplanar, multisequence MR imaging of the lumbar spine was performed. No intravenous contrast was administered.  COMPARISON:  None.  FINDINGS: Severe alteration of marrow signal related to chronic renal failure. Advanced disc space narrowing at L1-2 through L4-5. Discal hyperintensity at L4-5 likely degenerative in nature although in the appropriate clinical setting, early discitis could have this appearance.  Endplate reactive changes without features to suggest osteomyelitis. Normal conus. Multi-cystic appearing kidneys consistent with end-stage renal disease.  At L1-2 there is a shallow central protrusion without impingement.  At L2-3, there is a shallow central protrusion with mild facet and ligamentum flavum hypertrophy. There is mild central canal stenosis without impingement.  At L3-4, there is mild annular bulging. The disc is severely narrowed. There is mild facet and ligamentum flavum hypertrophy. There is a large extraforaminal protrusion worse on the LEFT. The LEFT L3 nerve root is likely displaced. Asymmetric narrowing of the subarticular zone could also affect the LEFT L4 nerve root.  At L4-5, there is severe disc space narrowing. Disc osteophyte complex projects posteriorly resulting in mild central canal stenosis when combined with posterior element hypertrophy. No definite L5 nerve root impingement in the canal. RIGHT-sided neural foraminal narrowing is multifactorial, but likely affects the RIGHT L4 nerve root.  Unremarkable appearing L5-S1 disc space.  No impingement.  IMPRESSION: The patient's LEFT leg weakness and numbness could be partially explained by and extraforaminal spur/protrusion at L3-4 on the LEFT with compressive pathology primarily affecting the L3 nerve root. Correlate clinically.  Multilevel spondylosis as described elsewhere. There are no areas of severe spinal stenosis or cauda equina nerve root compression observed.  Marrow changes  consistent with end-stage renal disease. Slight discal hyperintensity at L4-5 likely degenerative, doubt diskitis.   Electronically Signed   By: Davonna Belling M.D.   On: 10/18/2013 21:11    Medications:  I have reviewed the patient's current medications. Scheduled: . aspirin  300 mg Rectal Daily   Or  . aspirin  325 mg Oral Daily  . enoxaparin (LOVENOX) injection  20 mg Subcutaneous Q24H  . ferric gluconate (FERRLECIT/NULECIT) IV  62.5 mg Intravenous Q Thu  . insulin aspart  0-9 Units Subcutaneous 6 times per day  . insulin glargine  14 Units Subcutaneous QHS  . multivitamin  1 tablet Oral QHS  . pneumococcal 23 valent vaccine  0.5 mL Intramuscular Tomorrow-1000  . sevelamer carbonate  1,600 mg Oral TID WC  . simvastatin  10 mg Oral q1800    Assessment/Plan: Patient improving. No evidence of acute infarct infarct on imaging.  MR of the lumbar spine reviewed and shows a significant L3-4 disc protrusion affecting the L3 nerve root.  This is not particularly consistent  with examination findings.  Slight discal hyperintensity at L4-5 is more consistent at this point but does not appear significant snough to cause the current findings.  Presentation may have some functional overlay.    Recommendations: 1.  Continue therapy.     LOS: 3 days   Thana Farr, MD [facility] [phone] 10/20/2013  9:39 AM

## 2013-10-20 NOTE — Progress Notes (Signed)
[facility] PROGRESS NOTE  Elizabeth [name] ZOX:[id number] DOB: 06/10/1945 DOA: 10/17/2013 PCP: [name], MD  Assessment/Plan: #1 left lower extremity weakness MRI/MRA of the head is negative for acute infarct. Concern for possible disc disease. MRI of the L-spine with extraforaminal spur/protrusion at L3-4 and some left compressive pathology affecting L3 nerve root. Per neurology findings on MRI is not particularly consistent with patient's examination findings. Patient is improving clinically. Continue current physical therapy. Neurology following and appreciate input and recommendations.   #2 end-stage renal disease on hemodialysis Patient has been seen by nephrology. Per renal.  #3 hyperlipidemia Continue statin.  #4 type 2 diabetes Continue Lantus and sliding scale insulin.  #5 urinary tract infection Urine cultures with multiple morphotypes. D/C IV Rocephin.  #6 prophylaxis Lovenox for DVT prophylaxis.  Code Status: Full Family Communication: Updated patient. Disposition Plan: To SNF in 1-2 days.   Consultants:  Neurology: Dr. Thad Ranger 10/17/2013  Nephrology: Dr. Arlean Hopping 10/17/2013  Procedures:  MRI/MRA head 10/18/2013.  MRI L spine 10/19/13  Antibiotics:  IV Rocephin 10/17/2013 >>> 10/20/2013  HPI/Subjective: Patient states improvement with a left lower extremity weakness, and is able to lift it off the bed. No other complaints.  Objective: Filed Vitals:   10/20/13 0941  BP: 145/73  Pulse: 123  Temp: 99.3 F (37.4 C)  Resp: 18    Intake/Output Summary (Last 24 hours) at 10/20/13 1154 Last data filed at 10/20/13 0009  Gross per 24 hour  Intake 3701.25 ml  Output    400 ml  Net 3301.25 ml   Filed Weights   10/17/13 0935 10/17/13 2143 10/17/13 2315  Weight: 37.649 kg (83 lb) 37.7 kg (83 lb 1.8 oz) 35.5 kg (78 lb 4.2 oz)    Exam:   General:  NAD  Cardiovascular: RRR  Respiratory: CTAB  Abdomen: Soft, nontender, nondistended,  positive bowel sounds.  Musculoskeletal: No clubbing cyanosis or edema  Data Reviewed: Basic Metabolic Panel:  Recent Labs Lab 10/17/13 1133 10/17/13 1556 10/19/13 1109 10/20/13 0344  NA 139 140 139 135*  K 3.8 3.6* 3.0* 3.7  CL 94* 95* 105 101  CO2 GLUCOSE 222* 232* 183* 229*  BUN 47* 52* 45* 51*  CREATININE 4.09* 4.19* 2.73* 2.85*  CALCIUM 9.0 8.5 7.0* 8.0*  PHOS  --  6.8* 2.0* 1.7*   Liver Function Tests:  Recent Labs Lab 10/17/13 1556 10/19/13 1109 10/20/13 0344  ALBUMIN 2.4* 1.7* 1.6*   No results found for this basename: LIPASE, AMYLASE,  in the last 168 hours No results found for this basename: AMMONIA,  in the last 168 hours CBC:  Recent Labs Lab 10/17/13 1133 10/19/13 1108  WBC 15.7* 11.3*  NEUTROABS 14.5*  --   HGB 13.8 10.4*  HCT 43.7 32.9*  MCV 84.2 82.5  PLT 122* 99*   Cardiac Enzymes: No results found for this basename: CKTOTAL, CKMB, CKMBINDEX, TROPONINI,  in the last 168 hours BNP (last 3 results) No results found for this basename: PROBNP,  in the last 8760 hours CBG:  Recent Labs Lab 10/19/13 1658 10/19/13 1956 10/20/13 0006 10/20/13 0414 10/20/13 0754  GLUCAP 199* 239* 250* 212* 153*    Recent Results (from the past 240 hour(s))  URINE CULTURE     Status: None   Collection Time    10/17/13 12:37 PM      Result Value Ref Range Status   Specimen Description URINE, RANDOM   Final   Special Requests NONE   Final  Culture  Setup Time     Final   Value: 10/17/2013 15:29     Performed at [facility] Count     Final   Value: >=100,000 COLONIES/ML     Performed at [facility]   Culture     Final   Value: Multiple bacterial morphotypes present, none predominant. Suggest appropriate recollection if clinically indicated.     Performed at [facility]   Report Status 10/18/2013 FINAL   Final     Studies: Mr Lumbar Spine Wo Contrast  10/18/2013   CLINICAL DATA:  LEFT leg weakness.  End-stage renal disease. MRI brain negative for stroke.  EXAM: MRI LUMBAR SPINE WITHOUT CONTRAST  TECHNIQUE: Multiplanar, multisequence MR imaging of the lumbar spine was performed. No intravenous contrast was administered.  COMPARISON:  None.  FINDINGS: Severe alteration of marrow signal related to chronic renal failure. Advanced disc space narrowing at L1-2 through L4-5. Discal hyperintensity at L4-5 likely degenerative in nature although in the appropriate clinical setting, early discitis could have this appearance.  Endplate reactive changes without features to suggest osteomyelitis. Normal conus. Multi-cystic appearing kidneys consistent with end-stage renal disease.  At L1-2 there is a shallow central protrusion without impingement.  At L2-3, there is a shallow central protrusion with mild facet and ligamentum flavum hypertrophy. There is mild central canal stenosis without impingement.  At L3-4, there is mild annular bulging. The disc is severely narrowed. There is mild facet and ligamentum flavum hypertrophy. There is a large extraforaminal protrusion worse on the LEFT. The LEFT L3 nerve root is likely displaced. Asymmetric narrowing of the subarticular zone could also affect the LEFT L4 nerve root.  At L4-5, there is severe disc space narrowing. Disc osteophyte complex projects posteriorly resulting in mild central canal stenosis when combined with posterior element hypertrophy. No definite L5 nerve root impingement in the canal. RIGHT-sided neural foraminal narrowing is multifactorial, but likely affects the RIGHT L4 nerve root.  Unremarkable appearing L5-S1 disc space.  No impingement.  IMPRESSION: The patient's LEFT leg weakness and numbness could be partially explained by and extraforaminal spur/protrusion at L3-4 on the LEFT with compressive pathology primarily affecting the L3 nerve root. Correlate clinically.  Multilevel spondylosis as described elsewhere. There are no areas of severe spinal stenosis  or cauda equina nerve root compression observed.  Marrow changes consistent with end-stage renal disease. Slight discal hyperintensity at L4-5 likely degenerative, doubt diskitis.   Electronically Signed   By: Davonna Belling M.D.   On: 10/18/2013 21:11    Scheduled Meds: . aspirin  300 mg Rectal Daily   Or  . aspirin  325 mg Oral Daily  . enoxaparin (LOVENOX) injection  20 mg Subcutaneous Q24H  . ferric gluconate (FERRLECIT/NULECIT) IV  62.5 mg Intravenous Q Thu  . insulin aspart  0-9 Units Subcutaneous 6 times per day  . insulin glargine  14 Units Subcutaneous QHS  . multivitamin  1 tablet Oral QHS  . pneumococcal 23 valent vaccine  0.5 mL Intramuscular Tomorrow-1000  . sevelamer carbonate  1,600 mg Oral TID WC  . simvastatin  10 mg Oral q1800   Continuous Infusions: . sodium chloride 75 mL/hr at 10/19/13 1704    Principal Problem:   Left leg weakness Active Problems:   HLD (hyperlipidemia)   Type II or unspecified type diabetes mellitus with peripheral circulatory disorders, uncontrolled(250.72)   UTI (urinary tract infection)   ESRD on hemodialysis    Time spent: 35 mins  [facility] MD [facility] Pager [phone]. If 7PM-7AM, please contact night-coverage at [email or web address], password [facility] 10/20/2013, 11:54 AM  LOS: 3 days

## 2013-10-20 NOTE — Progress Notes (Signed)
Subjective:  Feeling better, moving left leg much better, pain only over knee abrasions   Objective: Vital signs in last 24 hours: Temp:  [98.6 F (37 C)-99.6 F (37.6 C)] 99.3 F (37.4 C) (08/30 0941) Pulse Rate:  [88-123] 123 (08/30 0941) Resp:  [16-18] 18 (08/30 0941) BP: (122-178)/(57-77) 145/73 mmHg (08/30 0941) SpO2:  [99 %-100 %] 100 % (08/30 0941) Weight change:   Intake/Output from previous day: 08/29 0701 - 08/30 0700 In: 3821.3 [P.O.:600; I.V.:3121.3; IV Piggyback:100] Out: 400 [Urine:400] Intake/Output this shift:   Lab Results:  Recent Labs  10/17/13 1133 10/19/13 1108  WBC 15.7* 11.3*  HGB 13.8 10.4*  HCT 43.7 32.9*  PLT 122* 99*   BMET:  Recent Labs  10/19/13 1109 10/20/13 0344  NA 139 135*  K 3.0* 3.7  CL 105 101  CO2 22 21  GLUCOSE 183* 229*  BUN 45* 51*  CREATININE 2.73* 2.85*  CALCIUM 7.0* 8.0*  ALBUMIN 1.7* 1.6*   No results found for this basename: PTH,  in the last 72 hours Iron Studies: No results found for this basename: IRON, TIBC, TRANSFERRIN, FERRITIN,  in the last 72 hours  Studies/Results: No results found.  EXAM:  General appearance: Alert, in no apparent distress  Resp: CTA without rales, rhonchi, or wheezes  Cardio: RRR without murmur or rub  GI: + BS, soft and nontender  Extremities: No edema, healing abrasions over both knees  Access: R IJ catheter, AVF @ LUA with + bruit   Dialysis Orders: TTS @ [facility]  3:30 38 kg 2K/2.25Ca 400/A1.5 R IJ catheter, also AVF @ LUA Heparin 3900 U  Hectorol 2 mcg Aranesp 80 mcg & Venofer 50 mg on Thurs   Assessment/Plan: 1. Left extremity weakness - no Hx of CVA, MRI/MRA negative for acute infarct, on ASA qd; extraforaminal spur/protrusion @ L3-4 on L with compressive pathology per MRI, but not consistent with findings, per Neurology.  2. UTI / neurogenic bladder with indwelling Foley-  per UA with culture pending, Foley catheter exchanged, s/p Rocephin.. 3. ESRD - HD on TTS @ [facility], K  3.7. HD pending today.. 4. HTN/volume - BP 145/73 on Carvedilol 12.5 mg bid; below EDW. 5. Anemia - Hgb 10.4, on Aranesp 80 mcg on hold, weekly Venofer.  6. Metabolic bone disease - Ca 8.5 (9.8 corrected), P 6.8, iPTH 356; Hectorol 2 mcg, Renvela 2 with meals. 7. Nutrition - Alb 2.4, renal carb-mod diet, multivitamin. 8. DM - insulin per primary     LOS: 3 days   LYLES,CHARLES 10/20/2013,10:08 AM  Pt seen, examined and agree w A/P as above.  Vinson Moselle MD pager [phone]    cell [phone] 10/20/2013, 1:00 PM   '

## 2013-10-21 DIAGNOSIS — R29898 Other symptoms and signs involving the musculoskeletal system: Secondary | ICD-10-CM | POA: Diagnosis not present

## 2013-10-21 LAB — CBC
HEMATOCRIT: 34.1 % — AB (ref 36.0–46.0)
HEMOGLOBIN: 10.6 g/dL — AB (ref 12.0–15.0)
MCH: 26.3 pg (ref 26.0–34.0)
MCHC: 31.1 g/dL (ref 30.0–36.0)
MCV: 84.6 fL (ref 78.0–100.0)
Platelets: 120 10*3/uL — ABNORMAL LOW (ref 150–400)
RBC: 4.03 MIL/uL (ref 3.87–5.11)
RDW: 17.2 % — AB (ref 11.5–15.5)
WBC: 8.9 10*3/uL (ref 4.0–10.5)

## 2013-10-21 LAB — GLUCOSE, CAPILLARY
GLUCOSE-CAPILLARY: 161 mg/dL — AB (ref 70–99)
GLUCOSE-CAPILLARY: 246 mg/dL — AB (ref 70–99)
Glucose-Capillary: 181 mg/dL — ABNORMAL HIGH (ref 70–99)
Glucose-Capillary: 143 mg/dL — ABNORMAL HIGH (ref 70–99)
Glucose-Capillary: 180 mg/dL — ABNORMAL HIGH (ref 70–99)
Glucose-Capillary: 309 mg/dL — ABNORMAL HIGH (ref 70–99)

## 2013-10-21 LAB — RENAL FUNCTION PANEL
Albumin: 1.7 g/dL — ABNORMAL LOW (ref 3.5–5.2)
Anion gap: 10 (ref 5–15)
BUN: 27 mg/dL — ABNORMAL HIGH (ref 6–23)
CO2: 25 meq/L (ref 19–32)
Calcium: 7.8 mg/dL — ABNORMAL LOW (ref 8.4–10.5)
Chloride: 101 mEq/L (ref 96–112)
Creatinine, Ser: 1.98 mg/dL — ABNORMAL HIGH (ref 0.50–1.10)
GFR calc Af Amer: 28 mL/min — ABNORMAL LOW (ref >90)
GFR, EST NON AFRICAN AMERICAN: 25 mL/min — AB (ref >90)
GLUCOSE: 181 mg/dL — AB (ref 70–99)
Phosphorus: 1.2 mg/dL — ABNORMAL LOW (ref 2.3–4.6)
Potassium: 4.2 mEq/L (ref 3.7–5.3)
SODIUM: 136 meq/L — AB (ref 137–147)

## 2013-10-21 MED ORDER — SODIUM PHOSPHATE 3 MMOLE/ML IV SOLN
20.0000 mmol | Freq: Once | INTRAVENOUS | Status: AC
  Administered 2013-10-21: 20 mmol via INTRAVENOUS
  Filled 2013-10-21: qty 6.67

## 2013-10-21 MED ORDER — CARVEDILOL 3.125 MG PO TABS
3.1250 mg | ORAL_TABLET | Freq: Two times a day (BID) | ORAL | Status: DC
  Filled 2013-10-21: qty 1

## 2013-10-21 MED ORDER — ENOXAPARIN SODIUM 30 MG/0.3ML ~~LOC~~ SOLN
20.0000 mg | SUBCUTANEOUS | Status: DC
  Administered 2013-10-21 – 2013-10-25 (×5): 20 mg via SUBCUTANEOUS
  Filled 2013-10-21 (×2): qty 0.3
  Filled 2013-10-21 (×2): qty 0.2
  Filled 2013-10-21 (×2): qty 0.3

## 2013-10-21 MED ORDER — ASPIRIN EC 81 MG PO TBEC
81.0000 mg | DELAYED_RELEASE_TABLET | Freq: Every day | ORAL | Status: DC
  Administered 2013-10-21 – 2013-10-24 (×4): 81 mg via ORAL
  Filled 2013-10-21 (×5): qty 1

## 2013-10-21 MED ORDER — CARVEDILOL 6.25 MG PO TABS
6.2500 mg | ORAL_TABLET | Freq: Two times a day (BID) | ORAL | Status: DC
  Administered 2013-10-21: 6.25 mg via ORAL
  Filled 2013-10-21: qty 1

## 2013-10-21 MED ORDER — CARVEDILOL 3.125 MG PO TABS: 3.1250 mg | ORAL_TABLET | Freq: Two times a day (BID) | ORAL | Status: DC

## 2013-10-21 NOTE — Care Management Note (Addendum)
  Page 1 of 1   10/21/2013     3:55:14 PM CARE MANAGEMENT NOTE 10/21/2013  Patient:  Elizabeth [name],Elizabeth [name]   Account Number:  [id number]  Date Initiated:  10/21/2013  Documentation initiated by:  Elmer Bales  Subjective/Objective Assessment:   Patient was admitted with left leg weakness. Lives at home alone     Action/Plan:   Will follow for discharge needs pending PT/OT evals and physician orders.   Anticipated DC Date:     Anticipated DC Plan:  SKILLED NURSING FACILITY  In-house referral  Clinical Social Worker         Choice offered to / List presented to:             Status of service:   Medicare Important Message given?  YES (If response is "NO", the following Medicare IM given date fields will be blank) Date Medicare IM given:  10/21/2013 Medicare IM given by:  Elmer Bales Date Additional Medicare IM given:   Additional Medicare IM given by:    Discharge Disposition:    Per UR Regulation:    If discussed at Long Length of Stay Meetings, dates discussed:    Comments:  10/21/13 1400 Elmer Bales RN, MSN, CM- Medicare IM letter provided.

## 2013-10-21 NOTE — Progress Notes (Signed)
Pt had large BM tonight, upon cleaning, noted stage III pressure ulcer on pt sacrum.  Wound bed covered in yellow slough, appx 2.5" x 1.5".  Sacral foam dressing applied, doc flowsheet updated.

## 2013-10-21 NOTE — Clinical Social Work Psychosocial (Signed)
Clinical Social Work Department BRIEF PSYCHOSOCIAL ASSESSMENT 10/21/2013  Patient:  Elizabeth [name],Elizabeth [name]     Account Number:  [id number]     Admit date:  10/17/2013  Clinical Social Worker:  Daiva Huge  Date/Time:  10/21/2013 04:16 PM  Referred by:  Physician  Date Referred:  10/21/2013 Referred for  SNF Placement   Other Referral:   Interview type:  Patient Other interview type:    PSYCHOSOCIAL DATA Living Status:  ALONE Admitted from facility:   Level of care:   Primary support name:  granddaughter, brother, niece Primary support relationship to patient:  FAMILY Degree of support available:   minimal    CURRENT CONCERNS Current Concerns  Post-Acute Placement   Other Concerns:    SOCIAL WORK ASSESSMENT / PLAN Met with patient who reports living alone prior to this admission- she is agreeable to SNF for rehab and is "upbeat" about her rehab potential and plans- she understands the plans for SNF search and all questions have been answered- she tells me she has a daughter in [place], a granddaughter, brother and niece nearby.   Assessment/plan status:  Other - See comment Other assessment/ plan:   FL2 and PASARR for SNF search   Information/referral to community resources:   SNF list    PATIENT'S/FAMILY'S RESPONSE TO PLAN OF CARE: Patient agreeable to SNF search- at this time she is open to a search and will pick from available options-  She has given me permission to call family which I will attempt to do for follow up    Eduard Clos, MSW, [place]

## 2013-10-21 NOTE — Clinical Social Work Placement (Signed)
Clinical Social Work Department CLINICAL SOCIAL WORK PLACEMENT NOTE 10/21/2013  Patient:  Elizabeth [name], Elizabeth [name]  Account Number:  [id number] Admit date:  10/17/2013  Clinical Social Worker:  Robin Searing  Date/time:  10/21/2013 04:24 PM  Clinical Social Work is seeking post-discharge placement for this patient at the following level of care:   SKILLED NURSING   (*CSW will update this form in Epic as items are completed)   10/21/2013  Patient/family provided with [facility] Department of Clinical Social Work's list of facilities offering this level of care within the geographic area requested by the patient (or if unable, by the patient's family).  10/21/2013  Patient/family informed of their freedom to choose among providers that offer the needed level of care, that participate in Medicare, Medicaid or managed care program needed by the patient, have an available bed and are willing to accept the patient.  10/21/2013  Patient/family informed of [facility]' ownership interest in [facility], as well as of the fact that they are under no obligation to receive care at this facility.  PASARR submitted to EDS on 10/21/2013 PASARR number received on 10/21/2013  FL2 transmitted to all facilities in geographic area requested by pt/family on  10/21/2013 FL2 transmitted to all facilities within larger geographic area on   Patient informed that his/her managed care company has contracts with or will negotiate with  certain facilities, including the following:     Patient/family informed of bed offers received:   Patient chooses bed at  Physician recommends and patient chooses bed at    Patient to be transferred to  on   Patient to be transferred to facility by  Patient and family notified of transfer on  Name of family member notified:    The following physician request were entered in Epic:   Additional Comments:  Reece Levy, MSW, [place] [phone]

## 2013-10-21 NOTE — Progress Notes (Signed)
Subjective: Continues to have left leg weakness but states it has improved slightly and able to walk in hall way with walker. Pain again has improved today.   Objective: Current vital signs: BP 98/54  Pulse 120  Temp(Src) 99.3 F (37.4 C) (Oral)  Resp 20  Ht 5\' 3"  (1.6 m)  Wt 41.5 kg (91 lb 7.9 oz)  BMI 16.21 kg/m2  SpO2 100% Vital signs in last 24 hours: Temp:  [97.7 F (36.5 C)-99.8 F (37.7 C)] 99.3 F (37.4 C) (08/31 1036) Pulse Rate:  [97-123] 120 (08/31 1036) Resp:  [18-20] 20 (08/31 1036) BP: (98-151)/(54-78) 98/54 mmHg (08/31 1036) SpO2:  [100 %] 100 % (08/31 1036) Weight:  [41.5 kg (91 lb 7.9 oz)-42.6 kg (93 lb 14.7 oz)] 41.5 kg (91 lb 7.9 oz) (08/30 1754)  Intake/Output from previous day: 08/30 0701 - 08/31 0700 In: -  Out: 1750 [Urine:750] Intake/Output this shift:   Nutritional status: Diabetic  Neurologic Exam: Mental Status: Alert, oriented, thought content appropriate.  Speech fluent without evidence of aphasia.  Able to follow 3 step commands without difficulty. Cranial Nerves: II: visual fields grossly normal, pupils equal, round, reactive to light and accommodation III,IV, VI: ptosis not present, extraocular muscles extra-ocular motions intact bilaterally V,VII: smile symmetric, facial light touch sensation normal bilaterally VIII: hearing normal bilaterally IX,X: gag reflex present XI: trapezius strength/neck flexion strength normal bilaterally XII: tongue strength normal  Motor: Right : Upper extremity    Left:     Upper extremity 5/5 deltoid       5/5 deltoid 5/5 tricep      5/5 tricep 5/5 biceps      5/5 biceps  5/5wrist flexion     5/5 wrist flexion 5/5 wrist extension     5/5 wrist extension 5/5 hand grip      5/5 hand grip  Lower extremity     Lower extremity 5/5 hip flexor      4-/5 hip flexor 5/5 hip adductors     4-/5 hip adductors 5/5 hip abductors     4-/5 hip abductors 5/5 quadricep      5/5 quadriceps  5/5 hamstrings     5/5  hamstrings 5/5 plantar flexion       5/5 plantar flexion 5/5 plantar extension     0/5 plantar extension Tone and bulk:normal tone throughout; no atrophy noted Sensory: decreased in all of left leg Deep Tendon Reflexes:  Right: Upper Extremity   Left: Upper extremity   biceps (C-5 to C-6) 2/4   biceps (C-5 to C-6) 2/4 tricep (C7) 2/4    triceps (C7) 2/4 Brachioradialis (C6) 2/4  Brachioradialis (C6) 2/4  Lower Extremity Lower Extremity  quadriceps (L-2 to L-4) 2/4   quadriceps (L-2 to L-4) 2/4 Achilles (S1) 0/4   Achilles (S1) 0/4 Plantars: Right: downgoing   Left: upgoing   Lab Results: Basic Metabolic Panel:  Recent Labs Lab 10/17/13 1133 10/17/13 1556 10/19/13 1109 10/20/13 0344 10/21/13 0800  NA 139 140 139 135* 136*  K 3.8 3.6* 3.0* 3.7 4.2  CL 94* 95* 105 101 101  CO2 20 21 22 21 25   GLUCOSE 222* 232* 183* 229* 181*  BUN 47* 52* 45* 51* 27*  CREATININE 4.09* 4.19* 2.73* 2.85* 1.98*  CALCIUM 9.0 8.5 7.0* 8.0* 7.8*  PHOS  --  6.8* 2.0* 1.7* 1.2*    Liver Function Tests:  Recent Labs Lab 10/17/13 1556 10/19/13 1109 10/20/13 0344 10/21/13 0800  ALBUMIN 2.4* 1.7* 1.6* 1.7*  No results found for this basename: LIPASE, AMYLASE,  in the last 168 hours No results found for this basename: AMMONIA,  in the last 168 hours  CBC:  Recent Labs Lab 10/17/13 1133 10/19/13 1108 10/21/13 0800  WBC 15.7* 11.3* 8.9  NEUTROABS 14.5*  --   --   HGB 13.8 10.4* 10.6*  HCT 43.7 32.9* 34.1*  MCV 84.2 82.5 84.6  PLT 122* 99* 120*    Cardiac Enzymes: No results found for this basename: CKTOTAL, CKMB, CKMBINDEX, TROPONINI,  in the last 168 hours  Lipid Panel:  Recent Labs Lab 10/18/13 0643  CHOL 210*  TRIG 96  HDL 85  CHOLHDL 2.5  VLDL 19  LDLCALC 454*    CBG:  Recent Labs Lab 10/20/13 1941 10/21/13 0019 10/21/13 0402 10/21/13 0654 10/21/13 1127  GLUCAP 137* 246* 181* 143* 161*    Microbiology: Results for orders placed during the hospital  encounter of 10/17/13  URINE CULTURE     Status: None   Collection Time    10/17/13 12:37 PM      Result Value Ref Range Status   Specimen Description URINE, RANDOM   Final   Special Requests NONE   Final   Culture  Setup Time     Final   Value: 10/17/2013 15:29     Performed at [facility] Count     Final   Value: >=100,000 COLONIES/ML     Performed at [facility]   Culture     Final   Value: Multiple bacterial morphotypes present, none predominant. Suggest appropriate recollection if clinically indicated.     Performed at [facility]   Report Status 10/18/2013 FINAL   Final    Coagulation Studies: No results found for this basename: LABPROT, INR,  in the last 72 hours  Imaging: No results found.  Medications:  Scheduled: . aspirin EC  81 mg Oral Daily  . carvedilol  6.25 mg Oral BID WC  . enoxaparin (LOVENOX) injection  20 mg Subcutaneous Q24H  . ferric gluconate (FERRLECIT/NULECIT) IV  62.5 mg Intravenous Q Thu  . insulin aspart  0-9 Units Subcutaneous 6 times per day  . insulin glargine  14 Units Subcutaneous QHS  . multivitamin  1 tablet Oral QHS  . simvastatin  10 mg Oral q1800  . sodium phosphate  Dextrose 5% IVPB  20 mmol Intravenous Once    Assessment/Plan: Patient continues to improve.  Walking with walker in hallway. No evidence of acute infarct infarct on imaging. MR of the lumbar spine reviewed and shows a significant L3-4 disc protrusion affecting the L3 nerve root. This is not particularly consistent with examination findings. Slight discal hyperintensity at L4-5 is more consistent at this point but does not appear significant snough to cause the current findings. Presentation on exam showed decreased effort with left leg.     Recommendations:  1. Continue therapy.  2. PT has recommended SNF rehab.      Felicie Morn PA-C [facility] [phone]  10/21/2013, 12:22 PM

## 2013-10-21 NOTE — Progress Notes (Signed)
Physical Therapy Treatment Patient Details Name: Elizabeth [name] MRN: [id number] DOB: Jun 19, 1945 Today's Date: 10/21/2013    History of Present Illness 68 y.o. female who noted on Tuesday morning she could not move her left leg.  She did not seek medical attention but got around her apartment community using furniture and "shopping carts that are in her apartment community".  Tuesday night she went to stand up off the couch and fell to the ground.  She stayed on the floor all day Wednesday into Thursday and finally called for assistance. Currently she still has significant left leg weakness. MRI negative for acute CVA, + for UTI.     PT Comments    Pt is progressing well with her gait distance, but continues to be weak in left leg especially during gait and continues to be at high risk for falls even with RW use.  Rehab denied admission, so SNF is recommended for rehab prior to returning home at this time.  PT will continue to follow acutely.    Follow Up Recommendations  SNF     Equipment Recommendations  Rolling walker with 5" wheels    Recommendations for Other Services   NA     Precautions / Restrictions Precautions Precautions: Fall Precaution Comments: generalized weakness, LLE weaker, watch for hyperextension Restrictions Weight Bearing Restrictions: No    Mobility  Bed Mobility Overal bed mobility: Needs Assistance Bed Mobility: Rolling;Sidelying to Sit Rolling: Min assist Sidelying to sit: Min assist       General bed mobility comments: Min assist to roll to her right side to manage her legs and then min assist of trunk to get to sitting EOB.  Pt educated in log roll to help with back discomfort.   Transfers Overall transfer level: Needs assistance Equipment used: Rolling walker (2 wheeled) Transfers: Sit to/from Stand Sit to Stand: Min assist         General transfer comment: Min assist to support trunk during transitions.  Pt standing over flexed knees and  then does go into mild hyperextension on her left leg in static standing.   Ambulation/Gait Ambulation/Gait assistance: Min assist Ambulation Distance (Feet): 80 Feet Assistive device: Rolling walker (2 wheeled) Gait Pattern/deviations: Step-through pattern;Decreased dorsiflexion - left;Steppage (genurecurvatum left in stance) Gait velocity: decreased Gait velocity interpretation: Below normal speed for age/gender General Gait Details: cues for upright posture, to stay inside of RW, for keeping RW in contact with ground while turning. Min assist to support trunk over weak legs.           Balance Overall balance assessment: History of Falls;Needs assistance Sitting-balance support: Feet supported;No upper extremity supported Sitting balance-Leahy Scale: Good     Standing balance support: Bilateral upper extremity supported Standing balance-Leahy Scale: Poor Standing balance comment: requires external assist to stand                    Cognition Arousal/Alertness: Awake/alert Behavior During Therapy: WFL for tasks assessed/performed Overall Cognitive Status: No family/caregiver present to determine baseline cognitive functioning (not specifically tested)                             Pertinent Vitals/Pain Pain Assessment: 0-10 Pain Score: 5  Pain Location: low back "herniated disc" every time she moves per pt report.  Pain Descriptors / Indicators: Aching Pain Intervention(s): Limited activity within patient's tolerance;Monitored during session;Repositioned           PT Goals (  current goals can now be found in the care plan section) Acute Rehab PT Goals Patient Stated Goal: to get stronger and walk  Progress towards PT goals: Progressing toward goals    Frequency  Min 2X/week    PT Plan Frequency needs to be updated;Discharge plan needs to be updated       End of Session Equipment Utilized During Treatment: Gait belt Activity Tolerance: Patient  limited by fatigue;Patient limited by pain Patient left: in chair;with call bell/phone within reach;with chair alarm set     Time: 2703-5009 PT Time Calculation (min): 13 min  Charges:  $Gait Training: 8-22 mins                      Adrian Specht B. Nancy Arvin, PT, DPT [phone]   10/21/2013, 10:51 AM

## 2013-10-21 NOTE — Progress Notes (Signed)
Subjective: Interval History: has complaints sore over abrasions. Strength better.  Objective: Vital signs in last 24 hours: Temp:  [97.7 F (36.5 C)-99.8 F (37.7 C)] 99.8 F (37.7 C) (08/31 0555) Pulse Rate:  [97-123] 123 (08/31 0555) Resp:  [18-20] 18 (08/31 0555) BP: (117-151)/(57-78) 131/75 mmHg (08/31 0555) SpO2:  [100 %] 100 % (08/31 0555) Weight:  [41.5 kg (91 lb 7.9 oz)-42.6 kg (93 lb 14.7 oz)] 41.5 kg (91 lb 7.9 oz) (08/30 1754) Weight change:   Intake/Output from previous day: 08/30 0701 - 08/31 0700 In: -  Out: 1750 [Urine:750] Intake/Output this shift:    General appearance: alert, cooperative and cachectic Resp: diminished breath sounds bilaterally Cardio: S1, S2 normal and systolic murmur: systolic ejection 2/6, decrescendo at 2nd left intercostal space GI: pos bs, soft, nontender, indwelling cath Extremities: PC RIJ and LUA AVF  Lab Results:  Recent Labs  10/19/13 1108 10/21/13 0800  WBC 11.3* 8.9  HGB 10.4* 10.6*  HCT 32.9* 34.1*  PLT 99* 120*   BMET:  Recent Labs  10/20/13 0344 10/21/13 0800  NA 135* 136*  K 3.7 4.2  CL 101 101  CO2 21 25  GLUCOSE 229* 181*  BUN 51* 27*  CREATININE 2.85* 1.98*  CALCIUM 8.0* 7.8*   No results found for this basename: PTH,  in the last 72 hours Iron Studies: No results found for this basename: IRON, TIBC, TRANSFERRIN, FERRITIN,  in the last 72 hours  Studies/Results: No results found.  I have reviewed the patient's current medications.  Assessment/Plan: 1 ESRD for HD in am 2 Low phos give bolus 3 DM controlled 4 UTI 5 LLE weak suspect spinal 6 Anemia stable P PT, mobilize, HD, for shuntogram    LOS: 4 days   Elizabeth [name] 10/21/2013,9:42 AM

## 2013-10-21 NOTE — Progress Notes (Signed)
Physical medicine rehabilitation consultation requested in chart reviewed. MRI of lumbar spine completed. Cranial CT scan and MRI of the brain negative. Patient does not meet criteria for inpatient rehabilitation services at this time. Recommend discharge to home versus skilled nursing facility.

## 2013-10-21 NOTE — Progress Notes (Signed)
[facility] PROGRESS NOTE  Elizabeth [name] JKK:[id number] DOB: 10-04-1945 DOA: 10/17/2013 PCP: [name], MD  Assessment/Plan: #1 left lower extremity weakness MRI/MRA of the head is negative for acute infarct. Concern for possible disc disease. MRI of the L-spine with extraforaminal spur/protrusion at L3-4 and some left compressive pathology affecting L3 nerve root. Per neurology findings on MRI is not particularly consistent with patient's examination findings. Patient is improving clinically. Continue current physical therapy. Neurology following and appreciate input and recommendations.   #2 end-stage renal disease on hemodialysis Patient has been seen by nephrology. Per renal.  #3 hyperlipidemia Continue statin.  #4 type 2 diabetes Continue Lantus and sliding scale insulin.  #5 urinary tract infection Urine cultures with multiple morphotypes. S/p 3 days IV Rocephin.  #6 prophylaxis Lovenox for DVT prophylaxis.  Code Status: Full Family Communication: Updated patient. Disposition Plan: To SNF in 1-2 days.   Consultants:  Neurology: Dr. Thad Ranger 10/17/2013  Nephrology: Dr. Arlean Hopping 10/17/2013  Procedures:  MRI/MRA head 10/18/2013.  MRI L spine 10/19/13  Antibiotics:  IV Rocephin 10/17/2013 >>> 10/20/2013  HPI/Subjective: Patient states improvement with a left lower extremity weakness, and is able to lift it off the bed. No other complaints.  Objective: Filed Vitals:   10/21/13 1401  BP: 126/68  Pulse: 115  Temp: 88.5 F (31.4 C)  Resp: 20    Intake/Output Summary (Last 24 hours) at 10/21/13 1744 Last data filed at 10/21/13 0155  Gross per 24 hour  Intake      0 ml  Output   1750 ml  Net  -1750 ml   Filed Weights   10/17/13 2315 10/20/13 1443 10/20/13 1754  Weight: 35.5 kg (78 lb 4.2 oz) 42.6 kg (93 lb 14.7 oz) 41.5 kg (91 lb 7.9 oz)    Exam:   General:  NAD  Cardiovascular: RRR  Respiratory: CTAB  Abdomen: Soft, nontender,  nondistended, positive bowel sounds.  Musculoskeletal: No clubbing cyanosis or edema  Data Reviewed: Basic Metabolic Panel:  Recent Labs Lab 10/17/13 1133 10/17/13 1556 10/19/13 1109 10/20/13 0344 10/21/13 0800  NA 139 140 139 135* 136*  K 3.8 3.6* 3.0* 3.7 4.2  CL 94* 95* 105 101 101  CO2 20 21 22 21 25   GLUCOSE 222* 232* 183* 229* 181*  BUN 47* 52* 45* 51* 27*  CREATININE 4.09* 4.19* 2.73* 2.85* 1.98*  CALCIUM 9.0 8.5 7.0* 8.0* 7.8*  PHOS  --  6.8* 2.0* 1.7* 1.2*   Liver Function Tests:  Recent Labs Lab 10/17/13 1556 10/19/13 1109 10/20/13 0344 10/21/13 0800  ALBUMIN 2.4* 1.7* 1.6* 1.7*   No results found for this basename: LIPASE, AMYLASE,  in the last 168 hours No results found for this basename: AMMONIA,  in the last 168 hours CBC:  Recent Labs Lab 10/17/13 1133 10/19/13 1108 10/21/13 0800  WBC 15.7* 11.3* 8.9  NEUTROABS 14.5*  --   --   HGB 13.8 10.4* 10.6*  HCT 43.7 32.9* 34.1*  MCV 84.2 82.5 84.6  PLT 122* 99* 120*   Cardiac Enzymes: No results found for this basename: CKTOTAL, CKMB, CKMBINDEX, TROPONINI,  in the last 168 hours BNP (last 3 results) No results found for this basename: PROBNP,  in the last 8760 hours CBG:  Recent Labs Lab 10/21/13 0019 10/21/13 0402 10/21/13 0654 10/21/13 1127 10/21/13 1702  GLUCAP 246* 181* 143* 161* 180*    Recent Results (from the past 240 hour(s))  URINE CULTURE     Status: None   Collection Time  10/17/13 12:37 PM      Result Value Ref Range Status   Specimen Description URINE, RANDOM   Final   Special Requests NONE   Final   Culture  Setup Time     Final   Value: 10/17/2013 15:29     Performed at [facility]   Colony Count     Final   Value: >=100,000 COLONIES/ML     Performed at [facility]   Culture     Final   Value: Multiple bacterial morphotypes present, none predominant. Suggest appropriate recollection if clinically indicated.     Performed at [facility]    Report Status 10/18/2013 FINAL   Final     Studies: No results found.  Scheduled Meds: . aspirin EC  81 mg Oral Daily  . carvedilol  6.25 mg Oral BID WC  . enoxaparin (LOVENOX) injection  20 mg Subcutaneous Q24H  . ferric gluconate (FERRLECIT/NULECIT) IV  62.5 mg Intravenous Q Thu  . insulin aspart  0-9 Units Subcutaneous 6 times per day  . insulin glargine  14 Units Subcutaneous QHS  . multivitamin  1 tablet Oral QHS  . simvastatin  10 mg Oral q1800  . sodium phosphate  Dextrose 5% IVPB  20 mmol Intravenous Once   Continuous Infusions: . sodium chloride 75 mL/hr at 10/21/13 1228    Principal Problem:   Left leg weakness Active Problems:   HLD (hyperlipidemia)   Type II or unspecified type diabetes mellitus with peripheral circulatory disorders, uncontrolled(250.72)   UTI (urinary tract infection)   ESRD on hemodialysis    Time spent: 35 mins    [facility] MD [facility] Pager [phone]. If 7PM-7AM, please contact night-coverage at [email or web address], password [facility] 10/21/2013, 5:44 PM  LOS: 4 days

## 2013-10-22 LAB — CBC
HCT: 33.1 % — ABNORMAL LOW (ref 36.0–46.0)
Hemoglobin: 10.2 g/dL — ABNORMAL LOW (ref 12.0–15.0)
MCH: 26.1 pg (ref 26.0–34.0)
MCHC: 30.8 g/dL (ref 30.0–36.0)
MCV: 84.7 fL (ref 78.0–100.0)
Platelets: 156 10*3/uL (ref 150–400)
RBC: 3.91 MIL/uL (ref 3.87–5.11)
RDW: 17.2 % — AB (ref 11.5–15.5)
WBC: 9.8 10*3/uL (ref 4.0–10.5)

## 2013-10-22 LAB — GLUCOSE, CAPILLARY
GLUCOSE-CAPILLARY: 123 mg/dL — AB (ref 70–99)
GLUCOSE-CAPILLARY: 174 mg/dL — AB (ref 70–99)
GLUCOSE-CAPILLARY: 58 mg/dL — AB (ref 70–99)
GLUCOSE-CAPILLARY: 89 mg/dL (ref 70–99)
GLUCOSE-CAPILLARY: 96 mg/dL (ref 70–99)
Glucose-Capillary: 217 mg/dL — ABNORMAL HIGH (ref 70–99)
Glucose-Capillary: 243 mg/dL — ABNORMAL HIGH (ref 70–99)

## 2013-10-22 LAB — RENAL FUNCTION PANEL
ALBUMIN: 1.5 g/dL — AB (ref 3.5–5.2)
Anion gap: 12 (ref 5–15)
BUN: 36 mg/dL — ABNORMAL HIGH (ref 6–23)
CALCIUM: 7.8 mg/dL — AB (ref 8.4–10.5)
CO2: 23 mEq/L (ref 19–32)
Chloride: 101 mEq/L (ref 96–112)
Creatinine, Ser: 2.34 mg/dL — ABNORMAL HIGH (ref 0.50–1.10)
GFR, EST AFRICAN AMERICAN: 23 mL/min — AB (ref >90)
GFR, EST NON AFRICAN AMERICAN: 20 mL/min — AB (ref >90)
Glucose, Bld: 100 mg/dL — ABNORMAL HIGH (ref 70–99)
PHOSPHORUS: 2.7 mg/dL (ref 2.3–4.6)
Potassium: 3.8 mEq/L (ref 3.7–5.3)
SODIUM: 136 meq/L — AB (ref 137–147)

## 2013-10-22 MED ORDER — NEPRO/CARBSTEADY PO LIQD: 237.0000 mL | ORAL | Status: DC | PRN

## 2013-10-22 NOTE — Progress Notes (Signed)
Patient has requested to be discharged to [facility]. Plans confirmed with patient and family who are in agreement. SNF has extended bed offer. Awaiting discharge.   Elizabeth [name], MSW Clinical Social Worker [phone]               .

## 2013-10-22 NOTE — Progress Notes (Signed)
Subjective: Interval History: has no complaint.  Objective: Vital signs in last 24 hours: Temp:  [88.5 F (31.4 C)-99.9 F (37.7 C)] 98.2 F (36.8 C) (09/01 0711) Pulse Rate:  [93-123] 94 (09/01 0730) Resp:  [16-20] 16 (09/01 0730) BP: (97-148)/(54-81) 97/56 mmHg (09/01 0730) SpO2:  [97 %-100 %] 98 % (09/01 0711) Weight change:   Intake/Output from previous day: 08/31 0701 - 09/01 0700 In: -  Out: 500 [Urine:500] Intake/Output this shift:    General appearance: alert, cooperative, cachectic and no distress Resp: clear to auscultation bilaterally Chest wall: RIJ Cath Cardio: S1, S2 normal and systolic murmur: holosystolic 2/6, blowing at apex GI: pos bs, soft, liver down 3 cm Extremities: AVF LUA , stitch, abrasions on knees  Lab Results:  Recent Labs  10/21/13 0800 10/22/13 0630  WBC 8.9 9.8  HGB 10.6* 10.2*  HCT 34.1* 33.1*  PLT 120* 156   BMET:  Recent Labs  10/21/13 0800 10/22/13 0630  NA 136* 136*  K 4.2 3.8  CL 101 101  CO2 25 23  GLUCOSE 181* 100*  BUN 27* 36*  CREATININE 1.98* 2.34*  CALCIUM 7.8* 7.8*   No results found for this basename: PTH,  in the last 72 hours Iron Studies: No results found for this basename: IRON, TIBC, TRANSFERRIN, FERRITIN,  in the last 72 hours  Studies/Results: No results found.  I have reviewed the patient's current medications.  Assessment/Plan: 1 ESRD for HD 2 HTN lower, so stop meds 3 Anemia stable 4 Decub mobilize 5 Neurogenic bladder cath 6 UTI 7 Leg weak PT 8 DM controlled P HD, Dm control, epo, PT,  Mobilize. Get stitch out on access    LOS: 5 days   Carolan Avedisian L 10/22/2013,9:04 AM

## 2013-10-22 NOTE — Progress Notes (Signed)
[facility] PROGRESS NOTE  Arneda Mally WUJ:[id number] DOB: [birth year] DOA: 10/17/2013 PCP: [name], MD  Assessment/Plan: #1 left lower extremity weakness MRI/MRA of the head is negative for acute infarct. Concern for possible disc disease. MRI of the L-spine with extraforaminal spur/protrusion at L3-4 and some left compressive pathology affecting L3 nerve root. Per neurology findings on MRI is not particularly consistent with patient's examination findings. Patient is improving clinically. Continue current physical therapy. Neurology following and appreciate input and recommendations.   #2 end-stage renal disease on hemodialysis Patient has been seen by nephrology. Per renal.  #3 hyperlipidemia Continue statin.  #4 type 2 diabetes Continue Lantus and sliding scale insulin.  #5 urinary tract infection Urine cultures with multiple morphotypes. S/p 3 days IV Rocephin.  #6 prophylaxis Lovenox for DVT prophylaxis.  Code Status: Full Family Communication: Updated patient. Disposition Plan: To SNF in 1-2 days, when bed available.   Consultants:  Neurology: Dr. Thad Ranger 10/17/2013  Nephrology: Dr. Arlean Hopping 10/17/2013  Procedures:  MRI/MRA head 10/18/2013.  MRI L spine 10/19/13  Antibiotics:  IV Rocephin 10/17/2013 >>> 10/20/2013  HPI/Subjective: Patient states improvement with a left lower extremity weakness, and is able to lift it off the bed. No other complaints. Patient seen in hemodialysis  Objective: Filed Vitals:   10/22/13 2120  BP: 112/54  Pulse: 121  Temp: 100.5 F (38.1 C)  Resp: 18    Intake/Output Summary (Last 24 hours) at 10/22/13 2202 Last data filed at 10/22/13 1711  Gross per 24 hour  Intake      0 ml  Output   1800 ml  Net  -1800 ml   Filed Weights   10/20/13 1443 10/20/13 1754 10/22/13 1125  Weight: 42.6 kg (93 lb 14.7 oz) 41.5 kg (91 lb 7.9 oz) 40.2 kg (88 lb 10 oz)    Exam:   General:  NAD  Cardiovascular:  RRR  Respiratory: CTAB  Abdomen: Soft, nontender, nondistended, positive bowel sounds.  Musculoskeletal: No clubbing cyanosis or edema  Data Reviewed: Basic Metabolic Panel:  Recent Labs Lab 10/17/13 1556 10/19/13 1109 10/20/13 0344 10/21/13 0800 10/22/13 0630  NA 140 139 135* 136* 136*  K 3.6* 3.0* 3.7 4.2 3.8  CL 95* 105 101 101 101  CO2 GLUCOSE 232* 183* 229* 181* 100*  BUN 52* 45* 51* 27* 36*  CREATININE 4.19* 2.73* 2.85* 1.98* 2.34*  CALCIUM 8.5 7.0* 8.0* 7.8* 7.8*  PHOS 6.8* 2.0* 1.7* 1.2* 2.7   Liver Function Tests:  Recent Labs Lab 10/17/13 1556 10/19/13 1109 10/20/13 0344 10/21/13 0800 10/22/13 0630  ALBUMIN 2.4* 1.7* 1.6* 1.7* 1.5*   No results found for this basename: LIPASE, AMYLASE,  in the last 168 hours No results found for this basename: AMMONIA,  in the last 168 hours CBC:  Recent Labs Lab 10/17/13 1133 10/19/13 1108 10/21/13 0800 10/22/13 0630  WBC 15.7* 11.3* 8.9 9.8  NEUTROABS 14.5*  --   --   --   HGB 13.8 10.4* 10.6* 10.2*  HCT 43.7 32.9* 34.1* 33.1*  MCV 84.2 82.5 84.6 84.7  PLT 122* 99* 120* 156   Cardiac Enzymes: No results found for this basename: CKTOTAL, CKMB, CKMBINDEX, TROPONINI,  in the last 168 hours BNP (last 3 results) No results found for this basename: PROBNP,  in the last 8760 hours CBG:  Recent Labs Lab 10/22/13 1222 10/22/13 1256 10/22/13 1619 10/22/13 1921 10/22/13 2130  GLUCAP 58* 96 217* 243* 174*    Recent Results (from the  past 240 hour(s))  URINE CULTURE     Status: None   Collection Time    10/17/13 12:37 PM      Result Value Ref Range Status   Specimen Description URINE, RANDOM   Final   Special Requests NONE   Final   Culture  Setup Time     Final   Value: 10/17/2013 15:29     Performed at [facility] Count     Final   Value: >=100,000 COLONIES/ML     Performed at [facility]   Culture     Final   Value: Multiple bacterial morphotypes  present, none predominant. Suggest appropriate recollection if clinically indicated.     Performed at [facility]   Report Status 10/18/2013 FINAL   Final     Studies: No results found.  Scheduled Meds: . aspirin EC  81 mg Oral Daily  . enoxaparin (LOVENOX) injection  20 mg Subcutaneous Q24H  . ferric gluconate (FERRLECIT/NULECIT) IV  62.5 mg Intravenous Q Thu  . insulin aspart  0-9 Units Subcutaneous 6 times per day  . insulin glargine  14 Units Subcutaneous QHS  . multivitamin  1 tablet Oral QHS  . simvastatin  10 mg Oral q1800   Continuous Infusions: . sodium chloride 75 mL/hr at 10/21/13 1228    Principal Problem:   Left leg weakness Active Problems:   HLD (hyperlipidemia)   Type II or unspecified type diabetes mellitus with peripheral circulatory disorders, uncontrolled(250.72)   UTI (urinary tract infection)   ESRD on hemodialysis    Time spent: 35 mins    [facility] MD [facility] Pager [phone]. If 7PM-7AM, please contact night-coverage at [email or web address], password [facility] 10/22/2013, 10:02 PM  LOS: 5 days

## 2013-10-22 NOTE — Procedures (Signed)
I was present at this session.  I have reviewed the session itself and made appropriate changes.  HD via PC.  bp falling, lower BP meds.  Shonda Mandarino L 9/1/20159:04 AM

## 2013-10-22 NOTE — Progress Notes (Signed)
Subjective: Patient stating her back is hurting her today.   Objective: Current vital signs: BP 124/65  Pulse 97  Temp(Src) 96.9 F (36.1 C) (Oral)  Resp 17  Ht  (1.6 m)  Wt 41.5 kg (91 lb 7.9 oz)  BMI 16.21 kg/m2  SpO2 98% Vital signs in last 24 hours: Temp:  [88.5 F (31.4 C)-99.9 F (37.7 C)] 96.9 F (36.1 C) (09/01 1125) Pulse Rate:  [93-123] 97 (09/01 1125) Resp:  [15-24] 17 (09/01 1125) BP: (84-148)/(55-81) 124/65 mmHg (09/01 1100) SpO2:  [97 %-100 %] 98 % (09/01 0711)  Intake/Output from previous day: 08/31 0701 - 09/01 0700 In: -  Out: 500 [Urine:500] Intake/Output this shift: Total I/O In: -  Out: 1000 [Other:1000] Nutritional status: Diabetic  Neurologic Exam: Mental Status: Alert, oriented, thought content appropriate.  Speech fluent without evidence of aphasia.  Able to follow 3 step commands without difficulty. Cranial Nerves: II: visual fields grossly normal, pupils equal, round, reactive to light and accommodation III,IV, VI: ptosis not present, extraocular muscles extra-ocular motions intact bilaterally V,VII: smile symmetric, facial light touch sensation normal bilaterally VIII: hearing normal bilaterally IX,X: gag reflex present XI: trapezius strength/neck flexion strength normal bilaterally XII: tongue strength normal  Motor: Right : Upper extremity    Left:     Upper extremity 5/5 deltoid       5/5 deltoid 5/5 tricep      5/5 tricep 5/5 biceps      5/5 biceps  5/5wrist flexion     5/5 wrist flexion 5/5 wrist extension     5/5 wrist extension 5/5 hand grip      5/5 hand grip  Lower extremity     Lower extremity 5/5 hip flexor      4/5 hip flexor 5/5 hip adductors     5/5 hip adductors 5/5 hip abductors     4/5 hip abductors 5/5 quadricep      4+/5 quadriceps  5/5 hamstrings     4/5 hamstrings 5/5 plantar flexion       5/5 plantar flexion 5/5 plantar extension     0/5 plantar extension Tone and bulk:normal tone throughout; no atrophy  noted Sensory: Pinprick and light touch intact throughout, bilaterally Deep Tendon Reflexes:  Right: Upper Extremity   Left: Upper extremity   biceps (C-5 to C-6) 2/4   biceps (C-5 to C-6) 2/4 tricep (C7) 2/4    triceps (C7) 2/4 Brachioradialis (C6) 2/4  Brachioradialis (C6) 2/4  Lower Extremity Lower Extremity  quadriceps (L-2 to L-4) /4   quadriceps (L-2 to L-4) 2/4 Achilles (S1) 1/4   Achilles (S1) 1/4 Plantars: Right: downgoing   Left: downgoing   Lab Results: Basic Metabolic Panel:  Recent Labs Lab 10/17/13 1556 10/19/13 1109 10/20/13 0344 10/21/13 0800 10/22/13 0630  NA 140 139 135* 136* 136*  K 3.6* 3.0* 3.7 4.2 3.8  CL 95* 105 101 101 101  CO2 GLUCOSE 232* 183* 229* 181* 100*  BUN 52* 45* 51* 27* 36*  CREATININE 4.19* 2.73* 2.85* 1.98* 2.34*  CALCIUM 8.5 7.0* 8.0* 7.8* 7.8*  PHOS 6.8* 2.0* 1.7* 1.2* 2.7    Liver Function Tests:  Recent Labs Lab 10/17/13 1556 10/19/13 1109 10/20/13 0344 10/21/13 0800 10/22/13 0630  ALBUMIN 2.4* 1.7* 1.6* 1.7* 1.5*   No results found for this basename: LIPASE, AMYLASE,  in the last 168 hours No results found for this basename: AMMONIA,  in the last 168 hours  CBC:  Recent Labs Lab 10/17/13 1133 10/19/13 1108 10/21/13 0800 10/22/13 0630  WBC 15.7* 11.3* 8.9 9.8  NEUTROABS 14.5*  --   --   --   HGB 13.8 10.4* 10.6* 10.2*  HCT 43.7 32.9* 34.1* 33.1*  MCV 84.2 82.5 84.6 84.7  PLT 122* 99* 120* 156    Cardiac Enzymes: No results found for this basename: CKTOTAL, CKMB, CKMBINDEX, TROPONINI,  in the last 168 hours  Lipid Panel:  Recent Labs Lab 10/18/13 0643  CHOL 210*  TRIG 96  HDL 85  CHOLHDL 2.5  VLDL 19  LDLCALC 756*    CBG:  Recent Labs Lab 10/21/13 1127 10/21/13 1702 10/21/13 2014 10/22/13 0030 10/22/13 0436  GLUCAP 161* 180* 309* 89 123*    Microbiology: Results for orders placed during the hospital encounter of 10/17/13  URINE CULTURE     Status: None   Collection  Time    10/17/13 12:37 PM      Result Value Ref Range Status   Specimen Description URINE, RANDOM   Final   Special Requests NONE   Final   Culture  Setup Time     Final   Value: 10/17/2013 15:29     Performed at [facility] Count     Final   Value: >=100,000 COLONIES/ML     Performed at [facility]   Culture     Final   Value: Multiple bacterial morphotypes present, none predominant. Suggest appropriate recollection if clinically indicated.     Performed at [facility]   Report Status 10/18/2013 FINAL   Final    Coagulation Studies: No results found for this basename: LABPROT, INR,  in the last 72 hours  Imaging: No results found.  Medications:  Scheduled: . aspirin EC  81 mg Oral Daily  . enoxaparin (LOVENOX) injection  20 mg Subcutaneous Q24H  . ferric gluconate (FERRLECIT/NULECIT) IV  62.5 mg Intravenous Q Thu  . insulin aspart  0-9 Units Subcutaneous 6 times per day  . insulin glargine  14 Units Subcutaneous QHS  . multivitamin  1 tablet Oral QHS  . simvastatin  10 mg Oral q1800    Assessment/Plan: Patient continues to feel her left leg is improving.  Exam remains unchanged and is very limited by back pain today.  Patient continues to show variable effort on exam. At this time would continue PT both inpatient and out patient.     Felicie Morn PA-C [facility] [phone]  10/22/2013, 11:36 AM

## 2013-10-22 NOTE — Progress Notes (Signed)
Inpatient Diabetes Program Recommendations  AACE/ADA: New Consensus Statement on Inpatient Glycemic Control (2013)  Target Ranges:  Prepandial:   less than 140 mg/dL      Peak postprandial:   less than 180 mg/dL (1-2 hours)      Critically ill patients:  140 - 180 mg/dL   Reason for Assessment: Results for NYARAH, BEDFORD (MRN [id number]) as of 10/22/2013 11:53  Ref. Range 10/21/2013 17:02 10/21/2013 20:14 10/22/2013 00:30 10/22/2013 04:36  Glucose-Capillary Latest Range: 70-99 mg/dL 361 (H) 224 (H) 89 497 (H)   Diabetes history: Type 2 Diabetes Outpatient Diabetes medications: Lantus 14 units q HS, Novolog 4 units tid with meals  Current orders for Inpatient glycemic control:   Please consider changing Novolog correction to tid with meals (instead of q 4 hours).  Also may consider restarting a portion of patient's home dose of Novolog meal coverage.  Consider adding Novolog 2 units tid with meals (Hold if patient eats less than 50%).  Thanks, Beryl Meager, RN, BC-ADM Inpatient Diabetes Coordinator Pager [phone]

## 2013-10-22 NOTE — Progress Notes (Addendum)
Hypoglycemic Event  CBG: 58  Treatment: 15 GM carbohydrate snack, pt ESRD, 1/2 c ginger-ale given  Symptoms: None  Follow-up CBG: Time:1245 CBG Result: 96  Possible Reasons for Event: Inadequate meal intake  Comments/MD notified:Chauncey Fischer, Demetria Iwai P  Remember to initiate Hypoglycemia Order Set & complete

## 2013-10-23 ENCOUNTER — Inpatient Hospital Stay (HOSPITAL_COMMUNITY): Payer: Medicare Other

## 2013-10-23 DIAGNOSIS — E1169 Type 2 diabetes mellitus with other specified complication: Secondary | ICD-10-CM

## 2013-10-23 LAB — GLUCOSE, CAPILLARY
GLUCOSE-CAPILLARY: 142 mg/dL — AB (ref 70–99)
GLUCOSE-CAPILLARY: 149 mg/dL — AB (ref 70–99)
GLUCOSE-CAPILLARY: 162 mg/dL — AB (ref 70–99)
GLUCOSE-CAPILLARY: 289 mg/dL — AB (ref 70–99)
Glucose-Capillary: 190 mg/dL — ABNORMAL HIGH (ref 70–99)
Glucose-Capillary: 32 mg/dL — CL (ref 70–99)
Glucose-Capillary: 46 mg/dL — ABNORMAL LOW (ref 70–99)
Glucose-Capillary: 145 mg/dL — ABNORMAL HIGH (ref 70–99)

## 2013-10-23 LAB — BASIC METABOLIC PANEL
ANION GAP: 10 (ref 5–15)
BUN: 21 mg/dL (ref 6–23)
CHLORIDE: 99 meq/L (ref 96–112)
CO2: 30 meq/L (ref 19–32)
Calcium: 7.7 mg/dL — ABNORMAL LOW (ref 8.4–10.5)
Creatinine, Ser: 2.31 mg/dL — ABNORMAL HIGH (ref 0.50–1.10)
GFR calc non Af Amer: 20 mL/min — ABNORMAL LOW (ref >90)
GFR, EST AFRICAN AMERICAN: 24 mL/min — AB (ref >90)
Glucose, Bld: 33 mg/dL — CL (ref 70–99)
Potassium: 3.7 mEq/L (ref 3.7–5.3)
SODIUM: 139 meq/L (ref 137–147)

## 2013-10-23 LAB — CBC
HEMATOCRIT: 32.6 % — AB (ref 36.0–46.0)
HEMOGLOBIN: 10 g/dL — AB (ref 12.0–15.0)
MCH: 26 pg (ref 26.0–34.0)
MCHC: 30.7 g/dL (ref 30.0–36.0)
MCV: 84.9 fL (ref 78.0–100.0)
Platelets: 163 10*3/uL (ref 150–400)
RBC: 3.84 MIL/uL — ABNORMAL LOW (ref 3.87–5.11)
RDW: 17.1 % — ABNORMAL HIGH (ref 11.5–15.5)
WBC: 9.9 10*3/uL (ref 4.0–10.5)

## 2013-10-23 MED ORDER — CARVEDILOL 6.25 MG PO TABS
6.2500 mg | ORAL_TABLET | Freq: Two times a day (BID) | ORAL | Status: DC
Start: 2013-10-23 — End: 2013-10-24
  Administered 2013-10-23: 6.25 mg via ORAL

## 2013-10-23 MED ORDER — INSULIN ASPART 100 UNIT/ML ~~LOC~~ SOLN
0.0000 [IU] | Freq: Three times a day (TID) | SUBCUTANEOUS | Status: DC
  Administered 2013-10-24: 1 [IU] via SUBCUTANEOUS
  Administered 2013-10-24: 2 [IU] via SUBCUTANEOUS

## 2013-10-23 MED ORDER — DEXTROSE 50 % IV SOLN
INTRAVENOUS | Status: AC
  Administered 2013-10-23: 25 mL
  Filled 2013-10-23: qty 50

## 2013-10-23 MED ORDER — CARVEDILOL 6.25 MG PO TABS
6.2500 mg | ORAL_TABLET | Freq: Two times a day (BID) | ORAL | Status: DC
  Filled 2013-10-23: qty 1

## 2013-10-23 MED ORDER — LIDOCAINE-PRILOCAINE 2.5-2.5 % EX CREA
1.0000 "application " | TOPICAL_CREAM | CUTANEOUS | Status: DC | PRN
  Filled 2013-10-23: qty 5

## 2013-10-23 MED ORDER — LIDOCAINE-PRILOCAINE 2.5-2.5 % EX CREA: 1.0000 "application " | TOPICAL_CREAM | CUTANEOUS | Status: DC | PRN

## 2013-10-23 MED ORDER — NEPRO/CARBSTEADY PO LIQD: 237.0000 mL | ORAL | Status: DC | PRN

## 2013-10-23 MED ORDER — ALTEPLASE 2 MG IJ SOLR: 2.0000 mg | Freq: Once | INTRAMUSCULAR | Status: AC | PRN

## 2013-10-23 MED ORDER — MORPHINE SULFATE 2 MG/ML IJ SOLN
2.0000 mg | Freq: Once | INTRAMUSCULAR | Status: AC
  Administered 2013-10-24: 2 mg via INTRAVENOUS
  Filled 2013-10-23: qty 1

## 2013-10-23 MED ORDER — HEPARIN SODIUM (PORCINE) 1000 UNIT/ML DIALYSIS
1000.0000 [IU] | INTRAMUSCULAR | Status: DC | PRN
  Filled 2013-10-23: qty 1

## 2013-10-23 MED ORDER — LIDOCAINE HCL (PF) 1 % IJ SOLN: 5.0000 mL | INTRAMUSCULAR | Status: DC | PRN

## 2013-10-23 MED ORDER — DEXTROSE 5 % IV SOLN
1.0000 g | INTRAVENOUS | Status: DC
  Administered 2013-10-23 – 2013-10-24 (×2): 1 g via INTRAVENOUS
  Filled 2013-10-23 (×3): qty 10

## 2013-10-23 MED ORDER — PENTAFLUOROPROP-TETRAFLUOROETH EX AERO: 1.0000 "application " | INHALATION_SPRAY | CUTANEOUS | Status: DC | PRN

## 2013-10-23 MED ORDER — SODIUM CHLORIDE 0.9 % IV SOLN: 100.0000 mL | INTRAVENOUS | Status: DC | PRN

## 2013-10-23 MED ORDER — HEPARIN SODIUM (PORCINE) 1000 UNIT/ML DIALYSIS: 1000.0000 [IU] | INTRAMUSCULAR | Status: DC | PRN

## 2013-10-23 MED ORDER — INSULIN GLARGINE 100 UNIT/ML ~~LOC~~ SOLN
5.0000 [IU] | Freq: Every day | SUBCUTANEOUS | Status: DC
  Administered 2013-10-23 – 2013-10-24 (×2): 5 [IU] via SUBCUTANEOUS
  Filled 2013-10-23 (×3): qty 0.05

## 2013-10-23 MED ORDER — HEPARIN SODIUM (PORCINE) 1000 UNIT/ML DIALYSIS: 100.0000 [IU]/kg | INTRAMUSCULAR | Status: DC | PRN

## 2013-10-23 MED ORDER — COLLAGENASE 250 UNIT/GM EX OINT
TOPICAL_OINTMENT | Freq: Every day | CUTANEOUS | Status: DC
  Administered 2013-10-24 – 2013-10-25 (×2): via TOPICAL
  Filled 2013-10-23: qty 30

## 2013-10-23 MED ORDER — ALTEPLASE 2 MG IJ SOLR
2.0000 mg | Freq: Once | INTRAMUSCULAR | Status: DC | PRN
  Filled 2013-10-23: qty 2

## 2013-10-23 NOTE — Progress Notes (Addendum)
Hypoglycemic Event  CBG: 32 at 0402,repeat 46 at 0412  Treatment: D50 IV 25 mL and orange juice  Symptoms: Sweaty  Follow-up CBG: Time:0417 CBG Result:190  Possible Reasons for Event: Inadequate meal intake  Comments/MD notified:yes    Elizabeth [name], Elizabeth [name]  Remember to initiate Hypoglycemia Order Set & complete NP Cloyde Reams paged and notified of pt's CBG results,no new orders at this time,said will pass it on to the day shift MD,Pt calm in bed, will however continue to monitor. Elizabeth [name], Elizabeth [name]

## 2013-10-23 NOTE — Progress Notes (Signed)
[facility] PROGRESS NOTE  Elizabeth [name] FMB:[id number] DOB: August 14, 1945 DOA: 10/17/2013 PCP: [name], MD  Assessment/Plan: 1 left lower extremity weakness MRI/MRA of the head is negative for acute infarct. Concern for possible disc disease. MRI of the L-spine with extraforaminal spur/protrusion at L3-4 and some left compressive pathology affecting L3 nerve root. Per neurology findings on MRI is not particularly consistent with patient's examination findings. Patient is improving clinically. Continue current physical therapy. Neurology following and appreciate input and recommendations.   2 end-stage renal disease on hemodialysis Patient has been seen by nephrology. Per renal.  3 hyperlipidemia Continue statin.  4 type 2 diabetes Hypoglycemic episode. Will decrease lantus to 5 units. Will change SSI to TID form every 6 hours.   5 urinary tract infection Urine cultures with multiple morphotypes.  3 days IV Rocephin. Mild fever, will continue with antibiotics.   6-Tachycardia; check EKG, resume coreg.   prophylaxis Lovenox for DVT prophylaxis.  Code Status: Full Family Communication: Updated patient. Disposition Plan: To SNF tomorrow if stable.    Consultants:  Neurology: Dr. Thad Ranger 10/17/2013  Nephrology: Dr. Arlean Hopping 10/17/2013  Procedures:  MRI/MRA head 10/18/2013.  MRI L spine 10/19/13  Antibiotics:  IV Rocephin 10/17/2013 >>> 10/20/2013  HPI/Subjective: Patient eating lunch, feeling well. No complaints.   Objective: Filed Vitals:   10/23/13 1333  BP: 109/55  Pulse: 129  Temp: 99.4 F (37.4 C)  Resp: 22    Intake/Output Summary (Last 24 hours) at 10/23/13 1627 Last data filed at 10/23/13 0600  Gross per 24 hour  Intake      0 ml  Output    925 ml  Net   -925 ml   Filed Weights   10/20/13 1443 10/20/13 1754 10/22/13 1125  Weight: 42.6 kg (93 lb 14.7 oz) 41.5 kg (91 lb 7.9 oz) 40.2 kg (88 lb 10 oz)    Exam:   General:   NAD  Cardiovascular: RRR  Respiratory: CTAB  Abdomen: Soft, nontender, nondistended, positive bowel sounds.  Musculoskeletal: No clubbing cyanosis or edema  Data Reviewed: Basic Metabolic Panel:  Recent Labs Lab 10/17/13 1556 10/19/13 1109 10/20/13 0344 10/21/13 0800 10/22/13 0630 10/23/13 0405  NA 140 139 135* 136* 136* 139  K 3.6* 3.0* 3.7 4.2 3.8 3.7  CL 95* 105 101 101 101 99  CO2 21 22 21 25 23 30   GLUCOSE 232* 183* 229* 181* 100* 33*  BUN 52* 45* 51* 27* 36* 21  CREATININE 4.19* 2.73* 2.85* 1.98* 2.34* 2.31*  CALCIUM 8.5 7.0* 8.0* 7.8* 7.8* 7.7*  PHOS 6.8* 2.0* 1.7* 1.2* 2.7  --    Liver Function Tests:  Recent Labs Lab 10/17/13 1556 10/19/13 1109 10/20/13 0344 10/21/13 0800 10/22/13 0630  ALBUMIN 2.4* 1.7* 1.6* 1.7* 1.5*   No results found for this basename: LIPASE, AMYLASE,  in the last 168 hours No results found for this basename: AMMONIA,  in the last 168 hours CBC:  Recent Labs Lab 10/17/13 1133 10/19/13 1108 10/21/13 0800 10/22/13 0630 10/23/13 0405  WBC 15.7* 11.3* 8.9 9.8 9.9  NEUTROABS 14.5*  --   --   --   --   HGB 13.8 10.4* 10.6* 10.2* 10.0*  HCT 43.7 32.9* 34.1* 33.1* 32.6*  MCV 84.2 82.5 84.6 84.7 84.9  PLT 122* 99* 120* 156 163   Cardiac Enzymes: No results found for this basename: CKTOTAL, CKMB, CKMBINDEX, TROPONINI,  in the last 168 hours BNP (last 3 results) No results found for this basename: PROBNP,  in the last  8760 hours CBG:  Recent Labs Lab 10/23/13 0402 10/23/13 0412 10/23/13 0427 10/23/13 0811 10/23/13 1143  GLUCAP 32* 46* 190* 142* 145*    Recent Results (from the past 240 hour(s))  URINE CULTURE     Status: None   Collection Time    10/17/13 12:37 PM      Result Value Ref Range Status   Specimen Description URINE, RANDOM   Final   Special Requests NONE   Final   Culture  Setup Time     Final   Value: 10/17/2013 15:29     Performed at [facility] Count     Final   Value: >=100,000  COLONIES/ML     Performed at [facility]   Culture     Final   Value: Multiple bacterial morphotypes present, none predominant. Suggest appropriate recollection if clinically indicated.     Performed at [facility]   Report Status 10/18/2013 FINAL   Final     Studies: No results found.  Scheduled Meds: . aspirin EC  81 mg Oral Daily  . carvedilol  6.25 mg Oral BID WC  . collagenase   Topical Daily  . enoxaparin (LOVENOX) injection  20 mg Subcutaneous Q24H  . ferric gluconate (FERRLECIT/NULECIT) IV  62.5 mg Intravenous Q Thu  . insulin aspart  0-9 Units Subcutaneous TID WC  . insulin glargine  5 Units Subcutaneous QHS  . multivitamin  1 tablet Oral QHS  . simvastatin  10 mg Oral q1800   Continuous Infusions:    Principal Problem:   Left leg weakness Active Problems:   HLD (hyperlipidemia)   Type II or unspecified type diabetes mellitus with peripheral circulatory disorders, uncontrolled(250.72)   UTI (urinary tract infection)   ESRD on hemodialysis    Time spent: 35 mins    Hartley Barefoot A MD [facility] Pager [phone]. If 7PM-7AM, please contact night-coverage at [email or web address], password [facility] 10/23/2013, 4:27 PM  LOS: 6 days

## 2013-10-23 NOTE — Progress Notes (Signed)
Subjective: Interval History: has no complaint, to go NH.  Objective: Vital signs in last 24 hours: Temp:  [96.9 F (36.1 C)-100.5 F (38.1 C)] 98.8 F (37.1 C) (09/02 0559) Pulse Rate:  [95-127] 116 (09/02 0559) Resp:  [15-24] 18 (09/02 0559) BP: (84-142)/(42-91) 109/54 mmHg (09/02 0559) SpO2:  [98 %-100 %] 100 % (09/02 0559) Weight:  [40.2 kg (88 lb 10 oz)] 40.2 kg (88 lb 10 oz) (09/01 1125) Weight change:   Intake/Output from previous day: 09/01 0701 - 09/02 0700 In: -  Out: 1925 [Urine:925] Intake/Output this shift:    General appearance: alert, cooperative and cachectic Resp: diminished breath sounds bilaterally Chest wall: RIJ cath Cardio: S1, S2 normal and systolic murmur: holosystolic 2/6, blowing at apex GI: soft,pos bs, liver down 3 cm Extremities: excoriations on knees, calves, AVF LUA B&T  Lab Results:  Recent Labs  10/22/13 0630 10/23/13 0405  WBC 9.8 9.9  HGB 10.2* 10.0*  HCT 33.1* 32.6*  PLT 156 163   BMET:  Recent Labs  10/22/13 0630 10/23/13 0405  NA 136* 139  K 3.8 3.7  CL 101 99  CO2 23 30  GLUCOSE 100* 33*  BUN 36* 21  CREATININE 2.34* 2.31*  CALCIUM 7.8* 7.7*   No results found for this basename: PTH,  in the last 72 hours Iron Studies: No results found for this basename: IRON, TIBC, TRANSFERRIN, FERRITIN,  in the last 72 hours  Studies/Results: No results found.  I have reviewed the patient's current medications.  Assessment/Plan: 1 ESRD ro HD TTS 2 Anemia stable 3 DM low last pm, meds lowered 4 UTIs 5HPTH 6 Leg weakness P HD in am, PT, DM control, use access soon , NH    LOS: 6 days   Elizabeth [name] L 10/23/2013,8:54 AM

## 2013-10-23 NOTE — Consult Note (Addendum)
WOC wound consult note Reason for Consult: Consult requested for sacral wound.  Pt states the wounds were present before admission; she fell at home and lay on the floor for a day and a half, also crawled across the tile floor on her knees. Suspect that back and sacrum wounds were deep tissue injury upon admission which might have been difficult to note R/T patient's dark skin tone, and it is typical for these type of wounds to evolve into necrotic areas within 7 days. Pt is emaciated and incontinent with multiple systemic factors which can impair healing. Wound type: Middle back 3X3X.1cm with 20% yellow slough unstageable wound, 80% stage 2 wound red and moist. Small amt yellow drainage, no odor. Sacrum with 2X4cm unstageable wound, 100% yellow slough, small amt yellow drainage, no odor.  Surrounded by erythremia to 1 cm.  Pressure Ulcer POA: Yes Measurement: Left knee with multiple areas of full thickness abrasions; some have evolved into eschar.  1X1cm, 3X5cm, 5X5cm. .5X.5cm No odor or drainage. Right knee with full thickness abrasions; 1X1cm, 3X5cm, .5X.5cm No odor or drainage. Dressing procedure/placement/frequency:  Air mattress to reduce pressure to sacrum.  Foam dressing to protect upper wound and promote healing, Santyl for chemical debridement to sacrum and nectrotic areas on bilat knees. Please re-consult if further assistance is needed.  Thank-you,  Cammie Mcgee MSN, RN, CWOCN, [place], CNS [phone]

## 2013-10-23 NOTE — Clinical Social Work Note (Signed)
Updated patient's daughter of plans for SNF transfer to [facility] at d/c- patient had initially asked CSW to call her granddaughter who is local but per daughter- April, she is now back in school- daughter agreeable to plans for SNF- CSW will follow up tomorrow for further dc planning-  Reece Levy, MSW, [employer] [phone]

## 2013-10-23 NOTE — Progress Notes (Signed)
Subjective: Feels that her leg is doing better.   Exam: Filed Vitals:   10/23/13 1846  BP: 97/46  Pulse: 119  Temp: 99.3 F (37.4 C)  Resp: 20   Gen: In bed, NAD MS: awake, alert, oriented WY:OVZCH, EOMI Motor: 5/5 UE, in LLE she has weakness of hip flexion, abduction, ankle dorsiflexion > plantarflexion Sensory: decreased on lateral aspect of left leg   Impression: 68 yo F with LLE weakness of unclear etiology. A peroneal neuropathy could explain her below the knee findings, but her hip abduction and flexion weakness would be unusual for this. An unsual cord lesion affecting the anterior horn could give rise to this thoguh would be very unusual. In this case she would be expected to develop spasticity over time but could be absent early on. Also a lumbosacral plexus lesion could cause similar problems.   Recommendations: 1)MRI T-spine, LS plexus.  2) If this is negaitve, I would favor treating with PT, OT and follow up for an outpatient EMG.   Ritta Slot, MD [facility] [phone]  If 7pm- 7am, please page neurology on call as listed in AMION.

## 2013-10-24 ENCOUNTER — Inpatient Hospital Stay (HOSPITAL_COMMUNITY): Payer: Medicare Other

## 2013-10-24 LAB — GLUCOSE, CAPILLARY
GLUCOSE-CAPILLARY: 280 mg/dL — AB (ref 70–99)
GLUCOSE-CAPILLARY: 105 mg/dL — AB (ref 70–99)
Glucose-Capillary: 127 mg/dL — ABNORMAL HIGH (ref 70–99)
Glucose-Capillary: 173 mg/dL — ABNORMAL HIGH (ref 70–99)
Glucose-Capillary: 214 mg/dL — ABNORMAL HIGH (ref 70–99)
Glucose-Capillary: 331 mg/dL — ABNORMAL HIGH (ref 70–99)

## 2013-10-24 LAB — CREATININE, SERUM
CREATININE: 2.91 mg/dL — AB (ref 0.50–1.10)
GFR calc Af Amer: 18 mL/min — ABNORMAL LOW (ref >90)
GFR calc non Af Amer: 15 mL/min — ABNORMAL LOW (ref >90)

## 2013-10-24 MED ORDER — HEPARIN SODIUM (PORCINE) 1000 UNIT/ML DIALYSIS
1000.0000 [IU] | INTRAMUSCULAR | Status: DC | PRN
  Filled 2013-10-24: qty 1

## 2013-10-24 MED ORDER — INSULIN ASPART 100 UNIT/ML ~~LOC~~ SOLN: 2.0000 [IU] | Freq: Three times a day (TID) | SUBCUTANEOUS | Status: DC

## 2013-10-24 MED ORDER — ALTEPLASE 2 MG IJ SOLR
2.0000 mg | Freq: Once | INTRAMUSCULAR | Status: DC | PRN
  Filled 2013-10-24: qty 2

## 2013-10-24 MED ORDER — NEPRO/CARBSTEADY PO LIQD: 237.0000 mL | ORAL | Status: DC | PRN

## 2013-10-24 MED ORDER — PENTAFLUOROPROP-TETRAFLUOROETH EX AERO: 1.0000 "application " | INHALATION_SPRAY | CUTANEOUS | Status: DC | PRN

## 2013-10-24 MED ORDER — LIDOCAINE-PRILOCAINE 2.5-2.5 % EX CREA: 1.0000 "application " | TOPICAL_CREAM | CUTANEOUS | Status: DC | PRN

## 2013-10-24 MED ORDER — SODIUM CHLORIDE 0.9 % IV SOLN: 100.0000 mL | INTRAVENOUS | Status: DC | PRN

## 2013-10-24 MED ORDER — CEPHALEXIN 250 MG PO CAPS: 250.0000 mg | ORAL_CAPSULE | Freq: Two times a day (BID) | ORAL | Status: DC

## 2013-10-24 MED ORDER — INSULIN GLARGINE 100 UNIT/ML ~~LOC~~ SOLN: 5.0000 [IU] | Freq: Every day | SUBCUTANEOUS | Status: DC

## 2013-10-24 MED ORDER — LIDOCAINE HCL (PF) 1 % IJ SOLN: 5.0000 mL | INTRAMUSCULAR | Status: DC | PRN

## 2013-10-24 MED ORDER — ACETAMINOPHEN 325 MG PO TABS
ORAL_TABLET | ORAL | Status: AC
  Filled 2013-10-24: qty 2

## 2013-10-24 MED ORDER — HEPARIN SODIUM (PORCINE) 1000 UNIT/ML DIALYSIS: 100.0000 [IU]/kg | INTRAMUSCULAR | Status: DC | PRN

## 2013-10-24 NOTE — Discharge Summary (Signed)
Physician Discharge Summary  Elizabeth [name] JXB:[id number] DOB: [birth year] DOA: 10/17/2013  PCP: [name], MD  Admit date: 10/17/2013 Discharge date: 10/24/2013  Time spent: 35 minutes  Recommendations for Outpatient Follow-up:  Needs to follow up with primary urologist for chronic foley catheter/   Discharge Diagnoses:    Left leg weakness, improved.    HLD (hyperlipidemia)   Type II or unspecified type diabetes mellitus with peripheral circulatory disorders, uncontrolled(250.72)   UTI (urinary tract infection)   ESRD on hemodialysis   Discharge Condition: stable.   Diet recommendation: renal , carb modified.   Filed Weights   10/20/13 1443 10/20/13 1754 10/22/13 1125  Weight: 42.6 kg (93 lb 14.7 oz) 41.5 kg (91 lb 7.9 oz) 40.2 kg (88 lb 10 oz)    History of present illness:  Patient is a 68 year old female with history of diabetes, hyperlipidemia, hypertension, ESRD on hemodialysis, TTS presented to the ER with left leg weakness since Tuesday. Patient reported that she was in her normal baseline health, ambulated without any assistance until Tuesday she went to hemodialysis 12-3pm. After the hemodialysis, she got back home, and she noted that her left leg was weak. The patient has been dragging her leg since. Patient reported that she got up off the couch last night to fix herself meal and fell off the couch. She has abrasions on the left arm and left knee. She did not go to hemodialysis today.  Patient also has indwelling Foley catheter and had noted foul-smelling urine for last few days. She otherwise denied any fevers, chills, chest pain, shortness breath, nausea and vomiting. Foley catheter was changed in the ED. UA positive for UTI, CT head negative for acute CVA   Hospital Course:  1 left lower extremity weakness  MRI/MRA of the head is negative for acute infarct. Concern for possible disc disease. MRI of the L-spine with extraforaminal spur/protrusion at L3-4 and some left  compressive pathology affecting L3 nerve root. Per neurology findings on MRI is not particularly consistent with patient's examination findings. Patient is improving clinically. Continue current physical therapy. Neurology following and appreciate input and recommendations.  If MRI thoracic negative, plan is to discharge patient today.   2 end-stage renal disease on hemodialysis  Patient has been seen by nephrology. Per renal.   3 hyperlipidemia  Continue statin.   4 type 2 diabetes  Hypoglycemic episode during this admission. lantus decrease to to 5 units.  Continue  with SSI,.  5 urinary tract infection  Urine cultures with multiple morphotypes. 3 days IV Rocephin. Mild fever, will continue with antibiotics.  Has chronic foley catheter, needs to follow up with primary urologist. Patient relates that she still fill a small  bag of urine daily.  She will be discharge on keflex for 7 days.   6-Tachycardia; resolved. Hold coreg due to soft SBP.   prophylaxis  Lovenox for DVT prophylaxis.   Procedures: MRI/MRA head 10/18/2013. MRI L spine 10/19/13   Consultations:  Nephrologist  Neurologist.   Discharge Exam: Filed Vitals:   10/24/13 0900  BP: 112/54  Pulse: 97  Temp: 98.1 F (36.7 C)  Resp: 18    General: no distress.  Cardiovascular: S 1, S 2 RRR Respiratory: CTA  Discharge Instructions You were cared for by a hospitalist during your hospital stay. If you have any questions about your discharge medications or the care you received while you were in the hospital after you are discharged, you can call the unit and asked to speak with  the hospitalist on call if the hospitalist that took care of you is not available. Once you are discharged, your primary care physician will handle any further medical issues. Please note that NO REFILLS for any discharge medications will be authorized once you are discharged, as it is imperative that you return to your primary care physician  (or establish a relationship with a primary care physician if you do not have one) for your aftercare needs so that they can reassess your need for medications and monitor your lab values.   Current Discharge Medication List    START taking these medications   Details  Nutritional Supplements (FEEDING SUPPLEMENT, NEPRO CARB STEADY,) LIQD Take 237 mLs by mouth as needed (missed meal during dialysis.). Qty: 30 Can, Refills: 0      CONTINUE these medications which have CHANGED   Details  insulin glargine (LANTUS) 100 UNIT/ML injection Inject 0.05 mLs (5 Units total) into the skin at bedtime. Qty: 10 mL, Refills: 11      CONTINUE these medications which have NOT CHANGED   Details  aspirin EC 81 MG tablet Take 81 mg by mouth daily.    multivitamin (RENA-VIT) TABS tablet Take 1 tablet by mouth daily.   Associated Diagnoses: Type II or unspecified type diabetes mellitus without mention of complication, not stated as uncontrolled    pravastatin (PRAVACHOL) 20 MG tablet Take 20 mg by mouth at bedtime.     glucose blood (ONETOUCH VERIO) test strip Use as instructed to check blood sugar 4 times per day dx code 250.00 Qty: 150 each, Refills: 3    ONETOUCH DELICA LANCETS FINE MISC Use to check blood sugar 4 times per day Qty: 200 each, Refills: 3      STOP taking these medications     carvedilol (COREG) 6.25 MG tablet      insulin aspart (NOVOLOG) 100 UNIT/ML injection        No Known Allergies Follow-up Information   Follow up with [facility], MD In 1 week.   Specialty:  Endocrinology   Contact information:   [address] [place] [address] [phone]        The results of significant diagnostics from this hospitalization (including imaging, microbiology, ancillary and laboratory) are listed below for reference.    Significant Diagnostic Studies: Dg Chest 2 View  10/18/2013   CLINICAL DATA:  Stroke  EXAM: CHEST  2 VIEW  COMPARISON:  08/19/2013   FINDINGS: Cardiomediastinal silhouette is stable. There is a dual lumen right IJ catheter with tip in right atrium. No pneumothorax. No acute infiltrate or pulmonary edema. Mild degenerative changes thoracic spine.  IMPRESSION: No active cardiopulmonary disease.   Electronically Signed   By: Natasha Mead M.D.   On: 10/18/2013 08:07   Ct Head Wo Contrast  10/17/2013   CLINICAL DATA:  Onset of left leg weakness since yesterday  EXAM: CT HEAD WITHOUT CONTRAST  TECHNIQUE: Contiguous axial images were obtained from the base of the skull through the vertex without intravenous contrast.  COMPARISON:  Noncontrast CT scan of the brain of August 19, 2013  FINDINGS: There is mild stable age appropriate diffuse cerebral and cerebellar atrophy with mild compensatory ventriculomegaly. There are stable punctate basal ganglia calcifications bilaterally. There is no evidence of acute ischemic change. The cerebellum and brainstem are normal.  There is mucoperiosteal thickening within theleft maxillary sinus and a the observed paranasal sinuses elsewhere are clear. The mastoid air cells are well pneumatized. There is no  lytic or blastic skull lesion and no evidence of an acute fracture. Left sphenoid sinus cell.  IMPRESSION: 1. There is no acute intracranial hemorrhage nor acute ischemic change. 2. There are mild stable diffuse atrophic changes. 3. There are inflammatory changes within the left maxillary and left sphenoid sinus cells.   Electronically Signed   By: David  [place]   On: 10/17/2013 10:46   Mr Brain Wo Contrast  10/18/2013   CLINICAL DATA:  Stroke. Hypertension, diabetes, hyperlipidemia. ESRD  EXAM: MRI HEAD WITHOUT CONTRAST  MRA HEAD WITHOUT CONTRAST  TECHNIQUE: Multiplanar, multiecho pulse sequences of the brain and surrounding structures were obtained without intravenous contrast. Angiographic images of the head were obtained using MRA technique without contrast.  COMPARISON:  CT 10/17/2013  FINDINGS: MRI HEAD  FINDINGS  Negative for acute infarct.  Mild atrophy. Minimal chronic microvascular ischemic change in the white matter. Small right frontal white matter infarct. Chronic micro hemorrhage right occipital lobe and left frontal lobe, most likely due to chronic hypertension. Negative for mass or edema.  Mucosal edema left maxillary sinus.  MRA HEAD FINDINGS  Both vertebral arteries are normal. PICA, AICA, superior cerebellar, and posterior cerebral arteries are widely patent and appear normal bilaterally  Internal carotid artery is normal bilaterally. Anterior and middle cerebral arteries are normal bilaterally  Negative for cerebral aneurysm.  IMPRESSION: Negative for acute infarct.  Negative MRA head   Electronically Signed   By: Marlan Palau M.D.   On: 10/18/2013 08:23   Mr Lumbar Spine Wo Contrast  10/18/2013   CLINICAL DATA:  LEFT leg weakness. End-stage renal disease. MRI brain negative for stroke.  EXAM: MRI LUMBAR SPINE WITHOUT CONTRAST  TECHNIQUE: Multiplanar, multisequence MR imaging of the lumbar spine was performed. No intravenous contrast was administered.  COMPARISON:  None.  FINDINGS: Severe alteration of marrow signal related to chronic renal failure. Advanced disc space narrowing at L1-2 through L4-5. Discal hyperintensity at L4-5 likely degenerative in nature although in the appropriate clinical setting, early discitis could have this appearance.  Endplate reactive changes without features to suggest osteomyelitis. Normal conus. Multi-cystic appearing kidneys consistent with end-stage renal disease.  At L1-2 there is a shallow central protrusion without impingement.  At L2-3, there is a shallow central protrusion with mild facet and ligamentum flavum hypertrophy. There is mild central canal stenosis without impingement.  At L3-4, there is mild annular bulging. The disc is severely narrowed. There is mild facet and ligamentum flavum hypertrophy. There is a large extraforaminal protrusion worse on  the LEFT. The LEFT L3 nerve root is likely displaced. Asymmetric narrowing of the subarticular zone could also affect the LEFT L4 nerve root.  At L4-5, there is severe disc space narrowing. Disc osteophyte complex projects posteriorly resulting in mild central canal stenosis when combined with posterior element hypertrophy. No definite L5 nerve root impingement in the canal. RIGHT-sided neural foraminal narrowing is multifactorial, but likely affects the RIGHT L4 nerve root.  Unremarkable appearing L5-S1 disc space.  No impingement.  IMPRESSION: The patient's LEFT leg weakness and numbness could be partially explained by and extraforaminal spur/protrusion at L3-4 on the LEFT with compressive pathology primarily affecting the L3 nerve root. Correlate clinically.  Multilevel spondylosis as described elsewhere. There are no areas of severe spinal stenosis or cauda equina nerve root compression observed.  Marrow changes consistent with end-stage renal disease. Slight discal hyperintensity at L4-5 likely degenerative, doubt diskitis.   Electronically Signed   By: Davonna Belling M.D.   On:  10/18/2013 21:11   Mr Maxine Glenn Head/brain Wo Cm  10/18/2013   CLINICAL DATA:  Stroke. Hypertension, diabetes, hyperlipidemia. ESRD  EXAM: MRI HEAD WITHOUT CONTRAST  MRA HEAD WITHOUT CONTRAST  TECHNIQUE: Multiplanar, multiecho pulse sequences of the brain and surrounding structures were obtained without intravenous contrast. Angiographic images of the head were obtained using MRA technique without contrast.  COMPARISON:  CT 10/17/2013  FINDINGS: MRI HEAD FINDINGS  Negative for acute infarct.  Mild atrophy. Minimal chronic microvascular ischemic change in the white matter. Small right frontal white matter infarct. Chronic micro hemorrhage right occipital lobe and left frontal lobe, most likely due to chronic hypertension. Negative for mass or edema.  Mucosal edema left maxillary sinus.  MRA HEAD FINDINGS  Both vertebral arteries are normal.  PICA, AICA, superior cerebellar, and posterior cerebral arteries are widely patent and appear normal bilaterally  Internal carotid artery is normal bilaterally. Anterior and middle cerebral arteries are normal bilaterally  Negative for cerebral aneurysm.  IMPRESSION: Negative for acute infarct.  Negative MRA head   Electronically Signed   By: Marlan Palau M.D.   On: 10/18/2013 08:23    Microbiology: Recent Results (from the past 240 hour(s))  URINE CULTURE     Status: None   Collection Time    10/17/13 12:37 PM      Result Value Ref Range Status   Specimen Description URINE, RANDOM   Final   Special Requests NONE   Final   Culture  Setup Time     Final   Value: 10/17/2013 15:29     Performed at [facility] Count     Final   Value: >=100,000 COLONIES/ML     Performed at [facility]   Culture     Final   Value: Multiple bacterial morphotypes present, none predominant. Suggest appropriate recollection if clinically indicated.     Performed at [facility]   Report Status 10/18/2013 FINAL   Final     Labs: Basic Metabolic Panel:  Recent Labs Lab 10/17/13 1556 10/19/13 1109 10/20/13 0344 10/21/13 0800 10/22/13 0630 10/23/13 0405 10/24/13 0621  NA 140 139 135* 136* 136* 139  --   K 3.6* 3.0* 3.7 4.2 3.8 3.7  --   CL 95* 105 101 101 101 99  --   CO2 --   GLUCOSE 232* 183* 229* 181* 100* 33*  --   BUN 52* 45* 51* 27* 36* 21  --   CREATININE 4.19* 2.73* 2.85* 1.98* 2.34* 2.31* 2.91*  CALCIUM 8.5 7.0* 8.0* 7.8* 7.8* 7.7*  --   PHOS 6.8* 2.0* 1.7* 1.2* 2.7  --   --    Liver Function Tests:  Recent Labs Lab 10/17/13 1556 10/19/13 1109 10/20/13 0344 10/21/13 0800 10/22/13 0630  ALBUMIN 2.4* 1.7* 1.6* 1.7* 1.5*   No results found for this basename: LIPASE, AMYLASE,  in the last 168 hours No results found for this basename: AMMONIA,  in the last 168 hours CBC:  Recent Labs Lab 10/17/13 1133 10/19/13 1108  10/21/13 0800 10/22/13 0630 10/23/13 0405  WBC 15.7* 11.3* 8.9 9.8 9.9  NEUTROABS 14.5*  --   --   --   --   HGB 13.8 10.4* 10.6* 10.2* 10.0*  HCT 43.7 32.9* 34.1* 33.1* 32.6*  MCV 84.2 82.5 84.6 84.7 84.9  PLT 122* 99* 120* 156 163   Cardiac Enzymes: No results found for this basename: CKTOTAL, CKMB, CKMBINDEX, TROPONINI,  in the last 168 hours BNP: BNP (last 3 results) No results found for this basename: PROBNP,  in the last 8760 hours CBG:  Recent Labs Lab 10/23/13 1646 10/23/13 2158 10/24/13 0027 10/24/13 0156 10/24/13 0624  GLUCAP 149* 289* 331* 280* 173*       Signed:  Regalado, Belkys A  [facility] 10/24/2013, 10:13 AM

## 2013-10-24 NOTE — Progress Notes (Signed)
Subjective: Interval History: has no complaint .  Objective: Vital signs in last 24 hours: Temp:  [98.8 F (37.1 C)-99.4 F (37.4 C)] 98.9 F (37.2 C) (09/03 0622) Pulse Rate:  [99-129] 99 (09/03 0622) Resp:  [18-22] 18 (09/03 0622) BP: (97-127)/(46-71) 119/50 mmHg (09/03 0622) SpO2:  [97 %-99 %] 98 % (09/03 0622) Weight change:   Intake/Output from previous day: 09/02 0701 - 09/03 0700 In: -  Out: 525 [Urine:525] Intake/Output this shift: Total I/O In: -  Out: 1 [Stool:1]  General appearance: alert and cachectic Resp: diminished breath sounds bilaterally Cardio: S1, S2 normal and systolic murmur: holosystolic 2/6, blowing at apex GI: pos bs, liver down 5 cm,soft Extremities: AVF LUA, RIJ cath  Lab Results:  Recent Labs  10/22/13 0630 10/23/13 0405  WBC 9.8 9.9  HGB 10.2* 10.0*  HCT 33.1* 32.6*  PLT 156 163   BMET:  Recent Labs  10/22/13 0630 10/23/13 0405 10/24/13 0621  NA 136* 139  --   K 3.8 3.7  --   CL 101 99  --   CO2 23 30  --   GLUCOSE 100* 33*  --   BUN 36* 21  --   CREATININE 2.34* 2.31* 2.91*  CALCIUM 7.8* 7.7*  --    No results found for this basename: PTH,  in the last 72 hours Iron Studies: No results found for this basename: IRON, TIBC, TRANSFERRIN, FERRITIN,  in the last 72 hours  Studies/Results: No results found.  I have reviewed the patient's current medications.  Assessment/Plan: 1 ESRD for HD, 2 HTN low, stop coreg 3 Anemia epo stable 4 HPTH 5 Neurogenic bladder 6 Leg weakness for MRI P HD, epo, MRI    LOS: 7 days   Demaris Leavell L 10/24/2013,8:30 AM

## 2013-10-24 NOTE — Progress Notes (Addendum)
Pt in hemodialysis currently, pt went down to MRI at 1130 this am. Pt was medicated with morphine 2mg  as ordered before MRI to aide in pt tolerating having scan done.    Per MRI staff the morphine did not help pt enough to help her get scans completed. Suggested to staff at that time to position her with something to keep the pressure off her sacral wound, which is where the pain is. Also asked them to have the nurse in radiology to contact the MD thru [email or web address] to get further med orders if positioning did not help.    Later pt returned to room, not aware that pt did not have MRI completed.   Finding out now that MRI was not completed because pt could not lay flat on her sacrum, which she must do in order to get MRI complete. She cannot have anything under her to relieve pressure to sacral area and she must be positioned particularly for hip MRI.  Was not given a reason by MRI staff as to why the RN was not informed that they did not complete doing the MRIs.   Pt is up for discharge this pm after dialysis, no order for discharge to facility, contacted Dr Sunnie Nielsen, noted no results of MRI, called MRI department, they could not do as noted in above notes. Dr Sunnie Nielsen asked RN to contact Dr Amada Jupiter neurology that wanted MRIs done and see how he wanted to proceed, ?discontinue order and do as outpt when wounds healed or try doing with more pain meds or conscious sedation by radiology department.  Dr Amada Jupiter text paged, he came up to unit, he was informed of above information and he contacted Dr Sunnie Nielsen.

## 2013-10-24 NOTE — Clinical Social Work Placement (Signed)
Clinical Social Work Department CLINICAL SOCIAL WORK PLACEMENT NOTE 10/24/2013  Patient:  ANYEA, FEIGENBAUM  Account Number:  [id number] Admit date:  10/17/2013  Clinical Social Worker:  Robin Searing  Date/time:  10/21/2013 04:24 PM  Clinical Social Work is seeking post-discharge placement for this patient at the following level of care:   SKILLED NURSING   (*CSW will update this form in Epic as items are completed)   10/21/2013  Patient/family provided with [facility] Department of Clinical Social Work's list of facilities offering this level of care within the geographic area requested by the patient (or if unable, by the patient's family).  10/21/2013  Patient/family informed of their freedom to choose among providers that offer the needed level of care, that participate in Medicare, Medicaid or managed care program needed by the patient, have an available bed and are willing to accept the patient.  10/21/2013  Patient/family informed of [facility]' ownership interest in [facility], as well as of the fact that they are under no obligation to receive care at this facility.  PASARR submitted to EDS on 10/21/2013 PASARR number received on 10/21/2013  FL2 transmitted to all facilities in geographic area requested by pt/family on  10/21/2013 FL2 transmitted to all facilities within larger geographic area on   Patient informed that his/her managed care company has contracts with or will negotiate with  certain facilities, including the following:     Patient/family informed of bed offers received:  10/22/2013 Patient chooses bed at [facility] Physician recommends and patient chooses bed at    Patient to be transferred to [facility] on  10/24/2013 Patient to be transferred to facility by EMS Patient and family notified of transfer on 10/24/2013 Name of family member notified:  DAUGHTER APRIL AND PATIENT  The following physician  request were entered in Epic:   Additional Comments: Reece Levy, MSW, Theresia Majors [phone]

## 2013-10-24 NOTE — Progress Notes (Signed)
Pt will not go to facility today, plan MRI tomorrow with sedation. Marlou Sa, SW and she will contact [employer]. Contacted daughter, April at [phone], and informed her of plan.

## 2013-10-24 NOTE — Progress Notes (Signed)
PT Cancellation Note  Patient Details Name: Elizabeth [name] MRN: [id number] DOB: 07-05-1945   Cancelled Treatment:     Pt off floor at hemodialysis at this time. D/C summary written, anticipate D/C to SNF. Will re-attempt to see pt if pt stays admitted as appropriate.    Donnamarie Poag [place], [place]  [phone] 10/24/2013, 2:24 PM

## 2013-10-24 NOTE — Clinical Social Work Note (Signed)
Patient for d/c today to SNF bed at  [facility]- Daughter and patient agreeable to this plan- plan transfer via EMS. Reece Levy, MSW, Theresia Majors [phone]

## 2013-10-25 ENCOUNTER — Inpatient Hospital Stay (HOSPITAL_COMMUNITY): Payer: Medicare Other | Admitting: Anesthesiology

## 2013-10-25 ENCOUNTER — Encounter (HOSPITAL_COMMUNITY)
Admission: EM | Disposition: A | Discharge status: Skilled Nursing Facility | Payer: Self-pay | Source: Home / Self Care | Attending: Internal Medicine

## 2013-10-25 ENCOUNTER — Inpatient Hospital Stay (HOSPITAL_COMMUNITY): Payer: Medicare Other

## 2013-10-25 ENCOUNTER — Encounter (HOSPITAL_COMMUNITY): Payer: Self-pay | Admitting: Certified Registered Nurse Anesthetist

## 2013-10-25 ENCOUNTER — Encounter (HOSPITAL_COMMUNITY): Payer: Medicare Other | Admitting: Anesthesiology

## 2013-10-25 HISTORY — PX: RADIOLOGY WITH ANESTHESIA: SHX6223

## 2013-10-25 LAB — GLUCOSE, CAPILLARY
GLUCOSE-CAPILLARY: 135 mg/dL — AB (ref 70–99)
GLUCOSE-CAPILLARY: 127 mg/dL — AB (ref 70–99)
GLUCOSE-CAPILLARY: 120 mg/dL — AB (ref 70–99)
Glucose-Capillary: 110 mg/dL — ABNORMAL HIGH (ref 70–99)
Glucose-Capillary: 199 mg/dL — ABNORMAL HIGH (ref 70–99)
Glucose-Capillary: 74 mg/dL (ref 70–99)
Glucose-Capillary: 88 mg/dL (ref 70–99)

## 2013-10-25 SURGERY — RADIOLOGY WITH ANESTHESIA
Anesthesia: Monitor Anesthesia Care

## 2013-10-25 MED ORDER — HEPARIN SODIUM (PORCINE) 1000 UNIT/ML DIALYSIS: 1000.0000 [IU] | INTRAMUSCULAR | Status: DC | PRN

## 2013-10-25 MED ORDER — MEPERIDINE HCL 25 MG/ML IJ SOLN: 6.2500 mg | INTRAMUSCULAR | Status: DC | PRN

## 2013-10-25 MED ORDER — SODIUM CHLORIDE 0.9 % IV SOLN
INTRAVENOUS | Status: DC
  Administered 2013-10-25: 12:00:00 via INTRAVENOUS

## 2013-10-25 MED ORDER — FENTANYL CITRATE 0.05 MG/ML IJ SOLN: 25.0000 ug | INTRAMUSCULAR | Status: DC | PRN

## 2013-10-25 MED ORDER — SODIUM CHLORIDE 0.9 % IV SOLN: 100.0000 mL | INTRAVENOUS | Status: DC | PRN

## 2013-10-25 MED ORDER — HEPARIN SODIUM (PORCINE) 1000 UNIT/ML DIALYSIS: 100.0000 [IU]/kg | INTRAMUSCULAR | Status: DC | PRN

## 2013-10-25 MED ORDER — ALTEPLASE 2 MG IJ SOLR
2.0000 mg | Freq: Once | INTRAMUSCULAR | Status: DC | PRN
  Filled 2013-10-25: qty 2

## 2013-10-25 MED ORDER — LIDOCAINE-PRILOCAINE 2.5-2.5 % EX CREA
1.0000 "application " | TOPICAL_CREAM | CUTANEOUS | Status: DC | PRN
  Filled 2013-10-25: qty 5

## 2013-10-25 MED ORDER — DEXTROSE 50 % IV SOLN
INTRAVENOUS | Status: AC
  Filled 2013-10-25: qty 50

## 2013-10-25 MED ORDER — NEPRO/CARBSTEADY PO LIQD: 237.0000 mL | ORAL | Status: DC | PRN

## 2013-10-25 MED ORDER — LIDOCAINE HCL (PF) 1 % IJ SOLN: 5.0000 mL | INTRAMUSCULAR | Status: DC | PRN

## 2013-10-25 MED ORDER — PENTAFLUOROPROP-TETRAFLUOROETH EX AERO: 1.0000 "application " | INHALATION_SPRAY | CUTANEOUS | Status: DC | PRN

## 2013-10-25 MED ORDER — PROMETHAZINE HCL 25 MG/ML IJ SOLN: 6.2500 mg | INTRAMUSCULAR | Status: DC | PRN

## 2013-10-25 NOTE — Progress Notes (Signed)
Patient left for SNF, in a stable condition

## 2013-10-25 NOTE — Progress Notes (Signed)
Subjective: Interval History: has no complaint .  Objective: Vital signs in last 24 hours: Temp:  [98.1 F (36.7 C)-98.8 F (37.1 C)] 98.8 F (37.1 C) (09/04 0537) Pulse Rate:  [81-111] 104 (09/04 0537) Resp:  [16-19] 16 (09/04 0537) BP: (93-141)/(45-82) 102/45 mmHg (09/04 0537) SpO2:  [97 %-100 %] 100 % (09/04 0537) Weight:  [41 kg (90 lb 6.2 oz)] 41 kg (90 lb 6.2 oz) (09/03 1345) Weight change:   Intake/Output from previous day: 09/03 0701 - 09/04 0700 In: 480 [P.O.:480] Out: 1251 [Urine:250; Stool:1] Intake/Output this shift:    General appearance: alert, cooperative and cachectic Resp: diminished breath sounds bilaterally Chest wall: RIJ cath Cardio: S1, S2 normal and systolic murmur: holosystolic 2/6, blowing at apex GI: soft, non-tender; bowel sounds normal; no masses,  no organomegaly Extremities: AVF LUA  Lab Results:  Recent Labs  10/23/13 0405  WBC 9.9  HGB 10.0*  HCT 32.6*  PLT 163   BMET:  Recent Labs  10/23/13 0405 10/24/13 0621  NA 139  --   K 3.7  --   CL 99  --   CO2 30  --   GLUCOSE 33*  --   BUN 21  --   CREATININE 2.31* 2.91*  CALCIUM 7.7*  --    No results found for this basename: PTH,  in the last 72 hours Iron Studies: No results found for this basename: IRON, TIBC, TRANSFERRIN, FERRITIN,  in the last 72 hours  Studies/Results: No results found.  I have reviewed the patient's current medications.  Assessment/Plan: 1 ESRD HD TTS 2 L leg weakness for MRI, doing better 3 Anemia stable 4 Malnutrition suppl 5 Indwelling cath P HD, mobilize, MRI    LOS: 8 days   Giordano Getman L 10/25/2013,8:23 AM

## 2013-10-25 NOTE — Progress Notes (Signed)
Called social worker to let her know there is an updated d/c for patient. Social worker called back and said will call [facility] and Regulatory affairs officer back.

## 2013-10-25 NOTE — Transfer of Care (Signed)
Immediate Anesthesia Transfer of Care Note  Patient: Elizabeth [name]  Procedure(s) Performed: Procedure(s): RADIOLOGY WITH ANESTHESIA (N/A)  Patient Location: PACU  Anesthesia Type:General  Level of Consciousness: alert , patient cooperative and responds to stimulation  Airway & Oxygen Therapy: Patient Spontanous Breathing, Patient connected to face mask oxygen and 8L BB FM  Post-op Assessment: Report given to PACU RN and Post -op Vital signs reviewed and stable  Post vital signs: Reviewed and stable  Complications: No apparent anesthesia complications

## 2013-10-25 NOTE — Progress Notes (Signed)
OT Cancellation Note  Patient Details Name: Elizabeth [name] MRN: [id number] DOB: 07/10/1945   Cancelled Treatment:    Reason Eval/Treat Not Completed: Patient at procedure or test/ unavailable. OT will follow up as available.  Rae Lips [phone] 10/25/2013, 11:50 AM

## 2013-10-25 NOTE — Progress Notes (Signed)
Called patient's daughter to let her know patient is been d/c to the the SNF.

## 2013-10-25 NOTE — Anesthesia Preprocedure Evaluation (Addendum)
Anesthesia Evaluation  Patient identified by MRN, date of birth, ID band Patient awake    Reviewed: Allergy & Precautions  Airway Mallampati: II TM Distance: >3 FB     Dental  (+) Poor Dentition, Missing, Dental Advisory Given   Pulmonary  breath sounds clear to auscultation        Cardiovascular hypertension, Pt. on medications + Peripheral Vascular Disease CAD: stress test 2014 EF 44% some fixed akinisia. Rhythm:Regular Rate:Normal     Neuro/Psych    GI/Hepatic   Endo/Other  diabetes, Type 2, Insulin Dependent  Renal/GU DialysisRenal disease     Musculoskeletal   Abdominal (+)  Abdomen: soft.    Peds  Hematology  (+) anemia ,   Anesthesia Other Findings   Reproductive/Obstetrics                        Anesthesia Physical Anesthesia Plan  ASA: IV  Anesthesia Plan: MAC   Post-op Pain Management:    Induction: Intravenous  Airway Management Planned:   Additional Equipment:   Intra-op Plan:   Post-operative Plan:   Informed Consent: I have reviewed the patients History and Physical, chart, labs and discussed the procedure including the risks, benefits and alternatives for the proposed anesthesia with the patient or authorized representative who has indicated his/her understanding and acceptance.     Plan Discussed with:   Anesthesia Plan Comments: (High risk patient.  Tried procedure yesterday and patient could not lay on sacrum 2nd to pain.  Has has some leg weakness and want to RO cord, nerve compression.  Problems include DM,HTN, RF, CAD, PVD, nutrition.  Will try MAC, but will convert to GA if necessary.  ASAIV)        Anesthesia Quick Evaluation

## 2013-10-25 NOTE — Progress Notes (Signed)
Patient is been discharged to SNF, report called to the receiving nurse Misty Stanley.

## 2013-10-25 NOTE — Discharge Summary (Signed)
Physician Discharge Summary  Elizabeth [name] GXQ:[id number] DOB: February 06, 1946 DOA: 10/17/2013  PCP: [name], MD  Admit date: 10/17/2013 Discharge date: 10/25/2013  Time spent: 35 minutes  Recommendations for Outpatient Follow-up:  Needs to follow up with primary urologist for chronic foley catheter/  Needs out patient follow up with neurosurgery if symptoms persist or not continue to improved.  Follow up for complex renal cyst.  Daily wound care, sacrum.    Discharge Diagnoses:    Left leg weakness, improved.    HLD (hyperlipidemia)   Type II or unspecified type diabetes mellitus with peripheral circulatory disorders, uncontrolled(250.72)   UTI (urinary tract infection)   ESRD on hemodialysis   Discharge Condition: stable.   Diet recommendation: renal , carb modified.   Filed Weights   10/20/13 1754 10/22/13 1125 10/24/13 1345  Weight: 41.5 kg (91 lb 7.9 oz) 40.2 kg (88 lb 10 oz) 41 kg (90 lb 6.2 oz)    History of present illness:  Patient is a 68 year old female with history of diabetes, hyperlipidemia, hypertension, ESRD on hemodialysis, TTS presented to the ER with left leg weakness since Tuesday. Patient reported that she was in her normal baseline health, ambulated without any assistance until Tuesday she went to hemodialysis 12-3pm. After the hemodialysis, she got back home, and she noted that her left leg was weak. The patient has been dragging her leg since. Patient reported that she got up off the couch last night to fix herself meal and fell off the couch. She has abrasions on the left arm and left knee. She did not go to hemodialysis today.  Patient also has indwelling Foley catheter and had noted foul-smelling urine for last few days. She otherwise denied any fevers, chills, chest pain, shortness breath, nausea and vomiting. Foley catheter was changed in the ED. UA positive for UTI, CT head negative for acute CVA   Hospital Course:  1 left lower extremity weakness   MRI/MRA of the head is negative for acute infarct. Concern for possible disc disease. MRI of the L-spine with extraforaminal spur/protrusion at L3-4 and some left compressive pathology affecting L3 nerve root. Per neurology findings on MRI is not particularly consistent with patient's examination findings. Patient is improving clinically. Continue current physical therapy. Neurology following and appreciate input and recommendations.  MRI with osteophytes, no nerve impingement, no spinal stenosis.   2 end-stage renal disease on hemodialysis  Patient has been seen by nephrology. Per renal.   3 hyperlipidemia  Continue statin.   4 type 2 diabetes  Hypoglycemic episode during this admission. lantus decrease to to 5 units.  Continue  with SSI,.  5 urinary tract infection  Urine cultures with multiple morphotypes. 3 days IV Rocephin. Mild fever, will continue with antibiotics.  Has chronic foley catheter, needs to follow up with primary urologist. Patient relates that she still fill a small  bag of urine daily.  She will be discharge on keflex for 7 days.   6-Tachycardia; resolved. Hold coreg due to soft SBP.   7-Decubitus ulcer, sacrum: Air mattress to reduce pressure to sacrum. Foam dressing to protect upper wound and promote healing, Santyl for chemical debridement to sacrum and nectrotic areas on bilat knees.    prophylaxis  Lovenox for DVT prophylaxis.   Procedures: MRI/MRA head 10/18/2013. MRI L spine 10/19/13   Consultations:  Nephrologist  Neurologist.   Discharge Exam: Filed Vitals:   10/25/13 1525  BP:   Pulse: 107  Temp: 98.9 F (37.2 C)  Resp:  General: no distress.  Cardiovascular: S 1, S 2 RRR Respiratory: CTA  Discharge Instructions You were cared for by a hospitalist during your hospital stay. If you have any questions about your discharge medications or the care you received while you were in the hospital after you are discharged, you can call the  unit and asked to speak with the hospitalist on call if the hospitalist that took care of you is not available. Once you are discharged, your primary care physician will handle any further medical issues. Please note that NO REFILLS for any discharge medications will be authorized once you are discharged, as it is imperative that you return to your primary care physician (or establish a relationship with a primary care physician if you do not have one) for your aftercare needs so that they can reassess your need for medications and monitor your lab values.   Current Discharge Medication List    START taking these medications   Details  cephALEXin (KEFLEX) 250 MG capsule Take 1 capsule (250 mg total) by mouth 2 (two) times daily. Qty: 14 capsule, Refills: 0    Nutritional Supplements (FEEDING SUPPLEMENT, NEPRO CARB STEADY,) LIQD Take 237 mLs by mouth as needed (missed meal during dialysis.). Qty: 30 Can, Refills: 0      CONTINUE these medications which have CHANGED   Details  insulin glargine (LANTUS) 100 UNIT/ML injection Inject 0.05 mLs (5 Units total) into the skin at bedtime. Qty: 10 mL, Refills: 11      CONTINUE these medications which have NOT CHANGED   Details  aspirin EC 81 MG tablet Take 81 mg by mouth daily.    multivitamin (RENA-VIT) TABS tablet Take 1 tablet by mouth daily.   Associated Diagnoses: Type II or unspecified type diabetes mellitus without mention of complication, not stated as uncontrolled    pravastatin (PRAVACHOL) 20 MG tablet Take 20 mg by mouth at bedtime.     glucose blood (ONETOUCH VERIO) test strip Use as instructed to check blood sugar 4 times per day dx code 250.00 Qty: 150 each, Refills: 3    ONETOUCH DELICA LANCETS FINE MISC Use to check blood sugar 4 times per day Qty: 200 each, Refills: 3      STOP taking these medications     carvedilol (COREG) 6.25 MG tablet      insulin aspart (NOVOLOG) 100 UNIT/ML injection        No Known  Allergies Follow-up Information   Follow up with [facility], MD In 1 week.   Specialty:  Endocrinology   Contact information:   [address] [place] [address] [phone]        The results of significant diagnostics from this hospitalization (including imaging, microbiology, ancillary and laboratory) are listed below for reference.    Significant Diagnostic Studies: Dg Chest 2 View  10/18/2013   CLINICAL DATA:  Stroke  EXAM: CHEST  2 VIEW  COMPARISON:  08/19/2013  FINDINGS: Cardiomediastinal silhouette is stable. There is a dual lumen right IJ catheter with tip in right atrium. No pneumothorax. No acute infiltrate or pulmonary edema. Mild degenerative changes thoracic spine.  IMPRESSION: No active cardiopulmonary disease.   Electronically Signed   By: Natasha Mead M.D.   On: 10/18/2013 08:07   Ct Head Wo Contrast  10/17/2013   CLINICAL DATA:  Onset of left leg weakness since yesterday  EXAM: CT HEAD WITHOUT CONTRAST  TECHNIQUE: Contiguous axial images were obtained from the base of the skull through  the vertex without intravenous contrast.  COMPARISON:  Noncontrast CT scan of the brain of August 19, 2013  FINDINGS: There is mild stable age appropriate diffuse cerebral and cerebellar atrophy with mild compensatory ventriculomegaly. There are stable punctate basal ganglia calcifications bilaterally. There is no evidence of acute ischemic change. The cerebellum and brainstem are normal.  There is mucoperiosteal thickening within theleft maxillary sinus and a the observed paranasal sinuses elsewhere are clear. The mastoid air cells are well pneumatized. There is no lytic or blastic skull lesion and no evidence of an acute fracture. Left sphenoid sinus cell.  IMPRESSION: 1. There is no acute intracranial hemorrhage nor acute ischemic change. 2. There are mild stable diffuse atrophic changes. 3. There are inflammatory changes within the left maxillary and left sphenoid sinus cells.    Electronically Signed   By: David  [place]   On: 10/17/2013 10:46   Mr Brain Wo Contrast  10/18/2013   CLINICAL DATA:  Stroke. Hypertension, diabetes, hyperlipidemia. ESRD  EXAM: MRI HEAD WITHOUT CONTRAST  MRA HEAD WITHOUT CONTRAST  TECHNIQUE: Multiplanar, multiecho pulse sequences of the brain and surrounding structures were obtained without intravenous contrast. Angiographic images of the head were obtained using MRA technique without contrast.  COMPARISON:  CT 10/17/2013  FINDINGS: MRI HEAD FINDINGS  Negative for acute infarct.  Mild atrophy. Minimal chronic microvascular ischemic change in the white matter. Small right frontal white matter infarct. Chronic micro hemorrhage right occipital lobe and left frontal lobe, most likely due to chronic hypertension. Negative for mass or edema.  Mucosal edema left maxillary sinus.  MRA HEAD FINDINGS  Both vertebral arteries are normal. PICA, AICA, superior cerebellar, and posterior cerebral arteries are widely patent and appear normal bilaterally  Internal carotid artery is normal bilaterally. Anterior and middle cerebral arteries are normal bilaterally  Negative for cerebral aneurysm.  IMPRESSION: Negative for acute infarct.  Negative MRA head   Electronically Signed   By: Marlan Palau M.D.   On: 10/18/2013 08:23   Mr Lumbar Spine Wo Contrast  10/18/2013   CLINICAL DATA:  LEFT leg weakness. End-stage renal disease. MRI brain negative for stroke.  EXAM: MRI LUMBAR SPINE WITHOUT CONTRAST  TECHNIQUE: Multiplanar, multisequence MR imaging of the lumbar spine was performed. No intravenous contrast was administered.  COMPARISON:  None.  FINDINGS: Severe alteration of marrow signal related to chronic renal failure. Advanced disc space narrowing at L1-2 through L4-5. Discal hyperintensity at L4-5 likely degenerative in nature although in the appropriate clinical setting, early discitis could have this appearance.  Endplate reactive changes without features to suggest  osteomyelitis. Normal conus. Multi-cystic appearing kidneys consistent with end-stage renal disease.  At L1-2 there is a shallow central protrusion without impingement.  At L2-3, there is a shallow central protrusion with mild facet and ligamentum flavum hypertrophy. There is mild central canal stenosis without impingement.  At L3-4, there is mild annular bulging. The disc is severely narrowed. There is mild facet and ligamentum flavum hypertrophy. There is a large extraforaminal protrusion worse on the LEFT. The LEFT L3 nerve root is likely displaced. Asymmetric narrowing of the subarticular zone could also affect the LEFT L4 nerve root.  At L4-5, there is severe disc space narrowing. Disc osteophyte complex projects posteriorly resulting in mild central canal stenosis when combined with posterior element hypertrophy. No definite L5 nerve root impingement in the canal. RIGHT-sided neural foraminal narrowing is multifactorial, but likely affects the RIGHT L4 nerve root.  Unremarkable appearing L5-S1 disc space.  No impingement.  IMPRESSION: The patient's LEFT leg weakness and numbness could be partially explained by and extraforaminal spur/protrusion at L3-4 on the LEFT with compressive pathology primarily affecting the L3 nerve root. Correlate clinically.  Multilevel spondylosis as described elsewhere. There are no areas of severe spinal stenosis or cauda equina nerve root compression observed.  Marrow changes consistent with end-stage renal disease. Slight discal hyperintensity at L4-5 likely degenerative, doubt diskitis.   Electronically Signed   By: Davonna Belling M.D.   On: 10/18/2013 21:11   Mr Maxine Glenn Head/brain Wo Cm  10/18/2013   CLINICAL DATA:  Stroke. Hypertension, diabetes, hyperlipidemia. ESRD  EXAM: MRI HEAD WITHOUT CONTRAST  MRA HEAD WITHOUT CONTRAST  TECHNIQUE: Multiplanar, multiecho pulse sequences of the brain and surrounding structures were obtained without intravenous contrast. Angiographic images  of the head were obtained using MRA technique without contrast.  COMPARISON:  CT 10/17/2013  FINDINGS: MRI HEAD FINDINGS  Negative for acute infarct.  Mild atrophy. Minimal chronic microvascular ischemic change in the white matter. Small right frontal white matter infarct. Chronic micro hemorrhage right occipital lobe and left frontal lobe, most likely due to chronic hypertension. Negative for mass or edema.  Mucosal edema left maxillary sinus.  MRA HEAD FINDINGS  Both vertebral arteries are normal. PICA, AICA, superior cerebellar, and posterior cerebral arteries are widely patent and appear normal bilaterally  Internal carotid artery is normal bilaterally. Anterior and middle cerebral arteries are normal bilaterally  Negative for cerebral aneurysm.  IMPRESSION: Negative for acute infarct.  Negative MRA head   Electronically Signed   By: Marlan Palau M.D.   On: 10/18/2013 08:23    Microbiology: Recent Results (from the past 240 hour(s))  URINE CULTURE     Status: None   Collection Time    10/17/13 12:37 PM      Result Value Ref Range Status   Specimen Description URINE, RANDOM   Final   Special Requests NONE   Final   Culture  Setup Time     Final   Value: 10/17/2013 15:29     Performed at [facility] Count     Final   Value: >=100,000 COLONIES/ML     Performed at [facility]   Culture     Final   Value: Multiple bacterial morphotypes present, none predominant. Suggest appropriate recollection if clinically indicated.     Performed at [facility]   Report Status 10/18/2013 FINAL   Final     Labs: Basic Metabolic Panel:  Recent Labs Lab 10/19/13 1109 10/20/13 0344 10/21/13 0800 10/22/13 0630 10/23/13 0405 10/24/13 0621  NA 139 135* 136* 136* 139  --   K 3.0* 3.7 4.2 3.8 3.7  --   CL 105 101 101 101 99  --   CO2 --   GLUCOSE 183* 229* 181* 100* 33*  --   BUN 45* 51* 27* 36* 21  --   CREATININE 2.73* 2.85* 1.98* 2.34* 2.31*  2.91*  CALCIUM 7.0* 8.0* 7.8* 7.8* 7.7*  --   PHOS 2.0* 1.7* 1.2* 2.7  --   --    Liver Function Tests:  Recent Labs Lab 10/19/13 1109 10/20/13 0344 10/21/13 0800 10/22/13 0630  ALBUMIN 1.7* 1.6* 1.7* 1.5*   No results found for this basename: LIPASE, AMYLASE,  in the last 168 hours No results found for this basename: AMMONIA,  in the last 168 hours CBC:  Recent Labs Lab 10/19/13 1108 10/21/13 0800 10/22/13  0630 10/23/13 0405  WBC 11.3* 8.9 9.8 9.9  HGB 10.4* 10.6* 10.2* 10.0*  HCT 32.9* 34.1* 33.1* 32.6*  MCV 82.5 84.6 84.7 84.9  PLT 99* 120* 156 163   Cardiac Enzymes: No results found for this basename: CKTOTAL, CKMB, CKMBINDEX, TROPONINI,  in the last 168 hours BNP: BNP (last 3 results) No results found for this basename: PROBNP,  in the last 8760 hours CBG:  Recent Labs Lab 10/25/13 0449 10/25/13 0654 10/25/13 1125 10/25/13 1502 10/25/13 1516  GLUCAP 135* 110* 88 74 127*       Signed:  Akili Cuda A  [facility] 10/25/2013, 3:58 PM

## 2013-10-26 NOTE — Anesthesia Postprocedure Evaluation (Signed)
  Anesthesia Post-op Note  Patient: Scientist, product/process development  Procedure(s) Performed: Procedure(s): RADIOLOGY WITH ANESTHESIA (N/A)  Patient Location: PACU  Anesthesia Type:General  Level of Consciousness: awake, alert  and oriented  Airway and Oxygen Therapy: Patient Spontanous Breathing and Patient connected to nasal cannula oxygen  Post-op Pain: mild  Post-op Assessment: Post-op Vital signs reviewed, Patient's Cardiovascular Status Stable and Respiratory Function Stable  Post-op Vital Signs: Reviewed and stable  Last Vitals:  Filed Vitals:   10/25/13 1525  BP:   Pulse: 107  Temp: 37.2 C  Resp:     Complications: No apparent anesthesia complications

## 2013-10-29 ENCOUNTER — Encounter (HOSPITAL_COMMUNITY): Payer: Self-pay | Admitting: Radiology

## 2013-10-29 LAB — GLUCOSE, CAPILLARY: GLUCOSE-CAPILLARY: 118 mg/dL — AB (ref 70–99)

## 2013-10-29 NOTE — Progress Notes (Signed)
This encounter was created in error - please disregard.

## 2013-11-11 ENCOUNTER — Ambulatory Visit: Payer: Medicare Other | Admitting: Endocrinology

## 2013-11-11 ENCOUNTER — Encounter: Payer: Self-pay | Admitting: *Deleted

## 2013-11-11 DIAGNOSIS — Z0289 Encounter for other administrative examinations: Secondary | ICD-10-CM

## 2013-12-24 ENCOUNTER — Encounter (HOSPITAL_COMMUNITY): Payer: Self-pay | Admitting: Physical Medicine and Rehabilitation

## 2013-12-24 ENCOUNTER — Inpatient Hospital Stay (HOSPITAL_COMMUNITY)
Admission: EM | Admit: 2013-12-24 | Discharge: 2013-12-28 | DRG: 640 | Disposition: A | Discharge status: Skilled Nursing Facility | Payer: Medicare Other | Attending: Internal Medicine | Admitting: Internal Medicine

## 2013-12-24 ENCOUNTER — Emergency Department (HOSPITAL_COMMUNITY): Payer: Medicare Other

## 2013-12-24 DIAGNOSIS — E875 Hyperkalemia: Principal | ICD-10-CM | POA: Diagnosis present

## 2013-12-24 DIAGNOSIS — I998 Other disorder of circulatory system: Secondary | ICD-10-CM

## 2013-12-24 DIAGNOSIS — R29898 Other symptoms and signs involving the musculoskeletal system: Secondary | ICD-10-CM

## 2013-12-24 DIAGNOSIS — Z794 Long term (current) use of insulin: Secondary | ICD-10-CM

## 2013-12-24 DIAGNOSIS — L89154 Pressure ulcer of sacral region, stage 4: Secondary | ICD-10-CM | POA: Diagnosis present

## 2013-12-24 DIAGNOSIS — M1990 Unspecified osteoarthritis, unspecified site: Secondary | ICD-10-CM | POA: Diagnosis present

## 2013-12-24 DIAGNOSIS — R64 Cachexia: Secondary | ICD-10-CM | POA: Diagnosis present

## 2013-12-24 DIAGNOSIS — R197 Diarrhea, unspecified: Secondary | ICD-10-CM | POA: Diagnosis present

## 2013-12-24 DIAGNOSIS — N186 End stage renal disease: Secondary | ICD-10-CM | POA: Diagnosis present

## 2013-12-24 DIAGNOSIS — L89159 Pressure ulcer of sacral region, unspecified stage: Secondary | ICD-10-CM

## 2013-12-24 DIAGNOSIS — Z87440 Personal history of urinary (tract) infections: Secondary | ICD-10-CM

## 2013-12-24 DIAGNOSIS — E1310 Other specified diabetes mellitus with ketoacidosis without coma: Secondary | ICD-10-CM | POA: Diagnosis present

## 2013-12-24 DIAGNOSIS — Z8673 Personal history of transient ischemic attack (TIA), and cerebral infarction without residual deficits: Secondary | ICD-10-CM

## 2013-12-24 DIAGNOSIS — I2510 Atherosclerotic heart disease of native coronary artery without angina pectoris: Secondary | ICD-10-CM | POA: Diagnosis present

## 2013-12-24 DIAGNOSIS — N319 Neuromuscular dysfunction of bladder, unspecified: Secondary | ICD-10-CM | POA: Diagnosis present

## 2013-12-24 DIAGNOSIS — Z681 Body mass index (BMI) 19 or less, adult: Secondary | ICD-10-CM

## 2013-12-24 DIAGNOSIS — E1165 Type 2 diabetes mellitus with hyperglycemia: Secondary | ICD-10-CM | POA: Diagnosis present

## 2013-12-24 DIAGNOSIS — E11649 Type 2 diabetes mellitus with hypoglycemia without coma: Secondary | ICD-10-CM

## 2013-12-24 DIAGNOSIS — E1110 Type 2 diabetes mellitus with ketoacidosis without coma: Secondary | ICD-10-CM

## 2013-12-24 DIAGNOSIS — E162 Hypoglycemia, unspecified: Secondary | ICD-10-CM

## 2013-12-24 DIAGNOSIS — M869 Osteomyelitis, unspecified: Secondary | ICD-10-CM

## 2013-12-24 DIAGNOSIS — A419 Sepsis, unspecified organism: Secondary | ICD-10-CM | POA: Diagnosis present

## 2013-12-24 DIAGNOSIS — R5381 Other malaise: Secondary | ICD-10-CM | POA: Diagnosis present

## 2013-12-24 DIAGNOSIS — D638 Anemia in other chronic diseases classified elsewhere: Secondary | ICD-10-CM | POA: Diagnosis present

## 2013-12-24 DIAGNOSIS — R7989 Other specified abnormal findings of blood chemistry: Secondary | ICD-10-CM

## 2013-12-24 DIAGNOSIS — N390 Urinary tract infection, site not specified: Secondary | ICD-10-CM | POA: Diagnosis present

## 2013-12-24 DIAGNOSIS — R627 Adult failure to thrive: Secondary | ICD-10-CM | POA: Diagnosis present

## 2013-12-24 DIAGNOSIS — L97829 Non-pressure chronic ulcer of other part of left lower leg with unspecified severity: Secondary | ICD-10-CM

## 2013-12-24 DIAGNOSIS — E860 Dehydration: Secondary | ICD-10-CM | POA: Diagnosis present

## 2013-12-24 DIAGNOSIS — I70229 Atherosclerosis of native arteries of extremities with rest pain, unspecified extremity: Secondary | ICD-10-CM

## 2013-12-24 DIAGNOSIS — Z992 Dependence on renal dialysis: Secondary | ICD-10-CM

## 2013-12-24 DIAGNOSIS — B9620 Unspecified Escherichia coli [E. coli] as the cause of diseases classified elsewhere: Secondary | ICD-10-CM | POA: Diagnosis present

## 2013-12-24 DIAGNOSIS — E785 Hyperlipidemia, unspecified: Secondary | ICD-10-CM | POA: Diagnosis present

## 2013-12-24 DIAGNOSIS — N2581 Secondary hyperparathyroidism of renal origin: Secondary | ICD-10-CM | POA: Diagnosis present

## 2013-12-24 DIAGNOSIS — N189 Chronic kidney disease, unspecified: Secondary | ICD-10-CM

## 2013-12-24 DIAGNOSIS — E43 Unspecified severe protein-calorie malnutrition: Secondary | ICD-10-CM | POA: Diagnosis present

## 2013-12-24 DIAGNOSIS — Z7982 Long term (current) use of aspirin: Secondary | ICD-10-CM

## 2013-12-24 DIAGNOSIS — R4020 Unspecified coma: Secondary | ICD-10-CM

## 2013-12-24 DIAGNOSIS — I120 Hypertensive chronic kidney disease with stage 5 chronic kidney disease or end stage renal disease: Secondary | ICD-10-CM | POA: Diagnosis present

## 2013-12-24 DIAGNOSIS — N179 Acute kidney failure, unspecified: Secondary | ICD-10-CM

## 2013-12-24 HISTORY — DX: Hyperkalemia: E87.5

## 2013-12-24 LAB — CBC
HCT: 29.2 % — ABNORMAL LOW (ref 36.0–46.0)
Hemoglobin: 8.8 g/dL — ABNORMAL LOW (ref 12.0–15.0)
MCH: 25 pg — ABNORMAL LOW (ref 26.0–34.0)
MCHC: 30.1 g/dL (ref 30.0–36.0)
MCV: 83 fL (ref 78.0–100.0)
PLATELETS: 281 10*3/uL (ref 150–400)
RBC: 3.52 MIL/uL — ABNORMAL LOW (ref 3.87–5.11)
RDW: 19 % — AB (ref 11.5–15.5)
WBC: 9.1 10*3/uL (ref 4.0–10.5)

## 2013-12-24 LAB — CBC WITH DIFFERENTIAL/PLATELET
BASOS ABS: 0.1 10*3/uL (ref 0.0–0.1)
Basophils Relative: 1 % (ref 0–1)
Eosinophils Absolute: 0.2 10*3/uL (ref 0.0–0.7)
Eosinophils Relative: 2 % (ref 0–5)
HCT: 32.5 % — ABNORMAL LOW (ref 36.0–46.0)
HEMOGLOBIN: 9.9 g/dL — AB (ref 12.0–15.0)
LYMPHS PCT: 14 % (ref 12–46)
Lymphs Abs: 1.1 10*3/uL (ref 0.7–4.0)
MCH: 24.3 pg — ABNORMAL LOW (ref 26.0–34.0)
MCHC: 30.5 g/dL (ref 30.0–36.0)
MCV: 79.9 fL (ref 78.0–100.0)
MONOS PCT: 10 % (ref 3–12)
Monocytes Absolute: 0.7 10*3/uL (ref 0.1–1.0)
NEUTROS ABS: 5.5 10*3/uL (ref 1.7–7.7)
NEUTROS PCT: 74 % (ref 43–77)
Platelets: 264 10*3/uL (ref 150–400)
RBC: 4.07 MIL/uL (ref 3.87–5.11)
RDW: 19.5 % — AB (ref 11.5–15.5)
WBC: 7.5 10*3/uL (ref 4.0–10.5)

## 2013-12-24 LAB — LIPASE, BLOOD: LIPASE: 6 U/L — AB (ref 11–59)

## 2013-12-24 LAB — COMPREHENSIVE METABOLIC PANEL
ALT: 12 U/L (ref 0–35)
AST: 12 U/L (ref 0–37)
Albumin: 1.6 g/dL — ABNORMAL LOW (ref 3.5–5.2)
Alkaline Phosphatase: 83 U/L (ref 39–117)
Anion gap: 14 (ref 5–15)
BUN: 44 mg/dL — ABNORMAL HIGH (ref 6–23)
CALCIUM: 9.4 mg/dL (ref 8.4–10.5)
CO2: 22 mEq/L (ref 19–32)
CREATININE: 3.41 mg/dL — AB (ref 0.50–1.10)
Chloride: 102 mEq/L (ref 96–112)
GFR calc Af Amer: 15 mL/min — ABNORMAL LOW (ref >90)
GFR, EST NON AFRICAN AMERICAN: 13 mL/min — AB (ref >90)
Glucose, Bld: 90 mg/dL (ref 70–99)
Potassium: 5 mEq/L (ref 3.7–5.3)
Sodium: 138 mEq/L (ref 137–147)
TOTAL PROTEIN: 6.5 g/dL (ref 6.0–8.3)
Total Bilirubin: <0.2 mg/dL — ABNORMAL LOW (ref 0.3–1.2)

## 2013-12-24 LAB — I-STAT CHEM 8, ED
BUN: 70 mg/dL — ABNORMAL HIGH (ref 6–23)
Calcium, Ion: 1.19 mmol/L (ref 1.13–1.30)
Chloride: 105 mEq/L (ref 96–112)
Creatinine, Ser: 3.8 mg/dL — ABNORMAL HIGH (ref 0.50–1.10)
Glucose, Bld: 87 mg/dL (ref 70–99)
HCT: 36 % (ref 36.0–46.0)
HEMOGLOBIN: 12.2 g/dL (ref 12.0–15.0)
POTASSIUM: 7.1 meq/L — AB (ref 3.7–5.3)
SODIUM: 137 meq/L (ref 137–147)
TCO2: 27 mmol/L (ref 0–100)

## 2013-12-24 LAB — URINE MICROSCOPIC-ADD ON

## 2013-12-24 LAB — URINALYSIS, ROUTINE W REFLEX MICROSCOPIC
Bilirubin Urine: NEGATIVE
Glucose, UA: 100 mg/dL — AB
Ketones, ur: NEGATIVE mg/dL
NITRITE: NEGATIVE
Protein, ur: >300 mg/dL — AB
SPECIFIC GRAVITY, URINE: 1.012 (ref 1.005–1.030)
Urobilinogen, UA: 0.2 mg/dL (ref 0.0–1.0)
pH: 7.5 (ref 5.0–8.0)

## 2013-12-24 LAB — I-STAT TROPONIN, ED: TROPONIN I, POC: 0.01 ng/mL (ref 0.00–0.08)

## 2013-12-24 LAB — RENAL FUNCTION PANEL
ANION GAP: 12 (ref 5–15)
Albumin: 1.7 g/dL — ABNORMAL LOW (ref 3.5–5.2)
BUN: 42 mg/dL — ABNORMAL HIGH (ref 6–23)
CHLORIDE: 104 meq/L (ref 96–112)
CO2: 25 mEq/L (ref 19–32)
Calcium: 9.3 mg/dL (ref 8.4–10.5)
Creatinine, Ser: 3.44 mg/dL — ABNORMAL HIGH (ref 0.50–1.10)
GFR calc Af Amer: 15 mL/min — ABNORMAL LOW (ref >90)
GFR, EST NON AFRICAN AMERICAN: 13 mL/min — AB (ref >90)
Glucose, Bld: 186 mg/dL — ABNORMAL HIGH (ref 70–99)
POTASSIUM: 4.2 meq/L (ref 3.7–5.3)
Phosphorus: 2.6 mg/dL (ref 2.3–4.6)
Sodium: 141 mEq/L (ref 137–147)

## 2013-12-24 LAB — GLUCOSE, CAPILLARY
GLUCOSE-CAPILLARY: 223 mg/dL — AB (ref 70–99)
Glucose-Capillary: 91 mg/dL (ref 70–99)

## 2013-12-24 MED ORDER — ENOXAPARIN SODIUM 30 MG/0.3ML ~~LOC~~ SOLN: 30.0000 mg | SUBCUTANEOUS | Status: DC

## 2013-12-24 MED ORDER — PENTAFLUOROPROP-TETRAFLUOROETH EX AERO: 1.0000 "application " | INHALATION_SPRAY | CUTANEOUS | Status: DC | PRN

## 2013-12-24 MED ORDER — DEXTROSE 50 % IV SOLN
1.0000 | Freq: Once | INTRAVENOUS | Status: AC
  Administered 2013-12-24: 50 mL via INTRAVENOUS
  Filled 2013-12-24: qty 50

## 2013-12-24 MED ORDER — ONDANSETRON HCL 4 MG/2ML IJ SOLN
4.0000 mg | Freq: Four times a day (QID) | INTRAMUSCULAR | Status: DC | PRN
  Administered 2013-12-26: 4 mg via INTRAVENOUS
  Filled 2013-12-24: qty 2

## 2013-12-24 MED ORDER — MORPHINE SULFATE 2 MG/ML IJ SOLN
1.0000 mg | INTRAMUSCULAR | Status: DC | PRN
Start: 2013-12-24 — End: 2013-12-28
  Administered 2013-12-25: 1 mg via INTRAVENOUS
  Administered 2013-12-26 (×3): 2 mg via INTRAVENOUS
  Filled 2013-12-24 (×3): qty 1

## 2013-12-24 MED ORDER — OXYCODONE-ACETAMINOPHEN 5-325 MG PO TABS
1.0000 | ORAL_TABLET | ORAL | Status: DC | PRN
  Administered 2013-12-24: 1 via ORAL
  Administered 2013-12-25 – 2013-12-26 (×3): 2 via ORAL
  Administered 2013-12-27: 1 via ORAL
  Filled 2013-12-24 (×2): qty 2
  Filled 2013-12-24 (×2): qty 1
  Filled 2013-12-24: qty 2

## 2013-12-24 MED ORDER — HEPARIN SODIUM (PORCINE) 1000 UNIT/ML DIALYSIS
1000.0000 [IU] | INTRAMUSCULAR | Status: DC | PRN
  Filled 2013-12-24: qty 1

## 2013-12-24 MED ORDER — HEPARIN SODIUM (PORCINE) 1000 UNIT/ML DIALYSIS: 3700.0000 [IU] | Freq: Once | INTRAMUSCULAR | Status: DC

## 2013-12-24 MED ORDER — DARBEPOETIN ALFA 200 MCG/0.4ML IJ SOSY
160.0000 ug | PREFILLED_SYRINGE | INTRAMUSCULAR | Status: DC
  Administered 2013-12-26: 160 ug via INTRAVENOUS
  Filled 2013-12-24: qty 0.4

## 2013-12-24 MED ORDER — RENA-VITE PO TABS
1.0000 | ORAL_TABLET | Freq: Every day | ORAL | Status: DC
  Administered 2013-12-24 – 2013-12-27 (×3): 1 via ORAL
  Filled 2013-12-24 (×5): qty 1

## 2013-12-24 MED ORDER — LIDOCAINE HCL (PF) 1 % IJ SOLN: 5.0000 mL | INTRAMUSCULAR | Status: DC | PRN

## 2013-12-24 MED ORDER — PRO-STAT SUGAR FREE PO LIQD
30.0000 mL | Freq: Every day | ORAL | Status: DC
  Administered 2013-12-24 – 2013-12-27 (×4): 30 mL via ORAL
  Filled 2013-12-24 (×5): qty 30

## 2013-12-24 MED ORDER — ASPIRIN EC 81 MG PO TBEC
81.0000 mg | DELAYED_RELEASE_TABLET | Freq: Every day | ORAL | Status: DC
  Administered 2013-12-24 – 2013-12-27 (×4): 81 mg via ORAL
  Filled 2013-12-24 (×6): qty 1

## 2013-12-24 MED ORDER — INSULIN ASPART 100 UNIT/ML IV SOLN
10.0000 [IU] | Freq: Once | INTRAVENOUS | Status: AC
  Administered 2013-12-24: 10 [IU] via INTRAVENOUS
  Filled 2013-12-24: qty 1

## 2013-12-24 MED ORDER — PRAVASTATIN SODIUM 20 MG PO TABS
20.0000 mg | ORAL_TABLET | Freq: Every day | ORAL | Status: DC
  Administered 2013-12-24 – 2013-12-27 (×3): 20 mg via ORAL
  Filled 2013-12-24 (×5): qty 1

## 2013-12-24 MED ORDER — CALCIUM GLUCONATE 10 % IV SOLN
1.0000 g | Freq: Once | INTRAVENOUS | Status: DC
  Filled 2013-12-24: qty 10

## 2013-12-24 MED ORDER — NEPRO/CARBSTEADY PO LIQD: 237.0000 mL | ORAL | Status: DC | PRN

## 2013-12-24 MED ORDER — ONDANSETRON HCL 4 MG PO TABS: 4.0000 mg | ORAL_TABLET | Freq: Four times a day (QID) | ORAL | Status: DC | PRN

## 2013-12-24 MED ORDER — LIDOCAINE-PRILOCAINE 2.5-2.5 % EX CREA: 1.0000 "application " | TOPICAL_CREAM | CUTANEOUS | Status: DC | PRN

## 2013-12-24 MED ORDER — SODIUM CHLORIDE 0.9 % IV SOLN
1.0000 g | Freq: Once | INTRAVENOUS | Status: AC
  Administered 2013-12-24: 1 g via INTRAVENOUS
  Filled 2013-12-24: qty 10

## 2013-12-24 MED ORDER — COLLAGENASE 250 UNIT/GM EX OINT
1.0000 "application " | TOPICAL_OINTMENT | Freq: Every evening | CUTANEOUS | Status: DC
  Administered 2013-12-24 – 2013-12-27 (×3): 1 via TOPICAL
  Filled 2013-12-24: qty 30

## 2013-12-24 MED ORDER — SODIUM CHLORIDE 0.9 % IV SOLN
125.0000 mg | INTRAVENOUS | Status: DC
  Administered 2013-12-24 – 2013-12-28 (×3): 125 mg via INTRAVENOUS
  Filled 2013-12-24 (×5): qty 10

## 2013-12-24 MED ORDER — NEPRO/CARBSTEADY PO LIQD: 237.0000 mL | Freq: Every day | ORAL | Status: DC

## 2013-12-24 MED ORDER — SEVELAMER CARBONATE 800 MG PO TABS
800.0000 mg | ORAL_TABLET | Freq: Three times a day (TID) | ORAL | Status: DC
  Administered 2013-12-24 – 2013-12-28 (×8): 800 mg via ORAL
  Filled 2013-12-24 (×14): qty 1

## 2013-12-24 MED ORDER — ZINC SULFATE 220 (50 ZN) MG PO CAPS
220.0000 mg | ORAL_CAPSULE | Freq: Every day | ORAL | Status: DC
  Administered 2013-12-24 – 2013-12-27 (×4): 220 mg via ORAL
  Filled 2013-12-24 (×5): qty 1

## 2013-12-24 MED ORDER — SODIUM CHLORIDE 0.9 % IV SOLN: 100.0000 mL | INTRAVENOUS | Status: DC | PRN

## 2013-12-24 MED ORDER — ALTEPLASE 2 MG IJ SOLR
2.0000 mg | Freq: Once | INTRAMUSCULAR | Status: AC | PRN
  Filled 2013-12-24: qty 2

## 2013-12-24 MED ORDER — NEPRO/CARBSTEADY PO LIQD
237.0000 mL | Freq: Three times a day (TID) | ORAL | Status: DC
  Administered 2013-12-24 – 2013-12-28 (×6): 237 mL via ORAL

## 2013-12-24 NOTE — ED Provider Notes (Signed)
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

## 2013-12-24 NOTE — ED Provider Notes (Signed)
CSN: [id number]     Arrival date & time 12/24/13  1001 History   First MD Initiated Contact with Patient 12/24/13 1003     Chief Complaint  Patient presents with  . Diarrhea     (Consider location/radiation/quality/duration/timing/severity/associated sxs/prior Treatment) HPI   Elizabeth [name] This 68 year old female with end-stage renal disease on dialysis, history of insulin-dependent diabetes, peripheral vascular disease, previous strokes, coronary artery disease and neurogenic bladder. Patient still makes some urine. Patient is brought in by EMS for diarrhea and need for dialysis. Patient states that Saturday she was feeling unwell and skipped her dialysis appointment. Yesterday evening she began having some lower abdominal cramping and multiple episodes of watery diarrhea. Patient was given loperamide and her diarrhea has resolved. She asked to be brought to the emergency department and dialyzes at the hospital because she is afraid she might have diarrhea on herself. Denies fevers, chills, myalgias, arthralgias. Denies DOE, SOB, chest tightness or pressure, radiation to left arm, jaw or back, or diaphoresis. Denies headaches, light headedness, weakness, visual disturbances. D Past Medical History  Diagnosis Date  . Hyperlipidemia   . Urinary retention     DX NEUROGENIC BLADDER--HAS INDWELLING FOLEY CATHETER  . Arthritis   . Anemia   . CAD (coronary artery disease)   . Neuropathic pain   . Shortness of breath     WITH EXERTION  . Stroke     patient not aware.  . Foley catheter in place     for urinary retention  . Critical lower limb ischemia   . Hypertension   . Peripheral vascular disease   . Hypoglycemia 08/19/2013  . Hypokalemia 08/19/2013  . Chronic kidney disease     PT TOLD RECENT TESTING SHOWS KIDNEY DAMAGE--IS SEEING NEPHROLOGIST.  OFFICE NOTES OBTAINED DR. MATTINGLY -"CHRONIC KIDNEY DISEASE 4 MOST LIKELY SECONDARY TO DIABETES"  . Diabetes mellitus without  complication     TAKES INSULIN    Past Surgical History  Procedure Laterality Date  . No previous surgery    . Insertion of suprapubic catheter N/A 05/16/2012    Procedure: INSERTION OF SUPRAPUBIC CATHETER;  Surgeon: Sebastian Ache, MD;  Location: [facility];  Service: Urology;  Laterality: N/A;  . Bascilic vein transposition Left 10/30/2012    Procedure: LEFT 1ST STAGE BASCILIC VEIN TRANSPOSITION;  Surgeon: Chuck Hint, MD;  Location: [facility];  Service: Vascular;  Laterality: Left;  . Nm myocar perf wall motion  04/19/2012    low risk study-EF 44%,mild inferolateral hypkinesis  . Bascilic vein transposition Left 02/05/2013    Procedure: BASCILIC VEIN TRANSPOSITION 2ND STAGE;  Surgeon: Chuck Hint, MD;  Location: [facility] OR;  Service: Vascular;  Laterality: Left;  . Radiology with anesthesia N/A 10/25/2013    Procedure: RADIOLOGY WITH ANESTHESIA;  Surgeon: Medication Radiologist, MD;  Location: [facility] OR;  Service: Radiology;  Laterality: N/A;   Family History  Problem Relation Age of Onset  . Hyperlipidemia Mother   . Hypertension Mother   . Hodgkin's lymphoma Father   . Heart disease Sister    History  Substance Use Topics  . Smoking status: Never Smoker   . Smokeless tobacco: Never Used  . Alcohol Use: No   OB History    No data available     Review of Systems  Ten systems reviewed and are negative for acute change, except as noted in the HPI.    Allergies  Review of patient's allergies indicates no known allergies.  Home Medications   Prior to  Admission medications   Medication Sig Start Date End Date Taking? Authorizing Provider  aspirin EC 81 MG tablet Take 81 mg by mouth daily.    Historical Provider, MD  cephALEXin (KEFLEX) 250 MG capsule Take 1 capsule (250 mg total) by mouth 2 (two) times daily. 10/24/13   Belkys A Regalado, MD  glucose blood (ONETOUCH VERIO) test strip Use as instructed to check blood sugar 4 times per day dx code 250.00 09/16/13   Reather LittlerAjay Kumar,  MD  insulin glargine (LANTUS) 100 UNIT/ML injection Inject 0.05 mLs (5 Units total) into the skin at bedtime. 10/24/13   Belkys A Regalado, MD  multivitamin (RENA-VIT) TABS tablet Take 1 tablet by mouth daily.    Historical Provider, MD  Nutritional Supplements (FEEDING SUPPLEMENT, NEPRO CARB STEADY,) LIQD Take 237 mLs by mouth as needed (missed meal during dialysis.). 10/24/13   Belkys A Regalado, MD  ONETOUCH DELICA LANCETS FINE MISC Use to check blood sugar 4 times per day 09/16/13   Reather LittlerAjay Kumar, MD  pravastatin (PRAVACHOL) 20 MG tablet Take 20 mg by mouth at bedtime.     Historical Provider, MD   BP 149/77 mmHg  Pulse 106  Temp(Src) 98.2 F (36.8 C) (Oral)  Resp 18  SpO2 99% Physical Exam  Constitutional: She is oriented to person, place, and time. She appears well-developed. No distress.  Chronically ill appearing  HENT:  Head: Normocephalic and atraumatic.  Eyes: Conjunctivae are normal. No scleral icterus.  Neck: Normal range of motion.  Cardiovascular: Normal rate, regular rhythm and normal heart sounds.  Exam reveals no gallop and no friction rub.   No murmur heard. Pulmonary/Chest: Effort normal and breath sounds normal. No respiratory distress.  Abdominal: Soft. Bowel sounds are normal. She exhibits no distension and no mass. There is no tenderness. There is no guarding.  Neurological: She is alert and oriented to person, place, and time.  Skin: Skin is warm and dry. She is not diaphoretic.  Decubitus and scapular ulcer (see Dr. Nelda MarseilleWickline's note)  Nursing note and vitals reviewed.   ED Course  Procedures (including critical care time) Labs Review Labs Reviewed  I-STAT CHEM 8, ED - Abnormal; Notable for the following:    Potassium 7.1 (*)    BUN 70 (*)    Creatinine, Ser 3.80 (*)    All other components within normal limits  CLOSTRIDIUM DIFFICILE BY PCR  CBC WITH DIFFERENTIAL  COMPREHENSIVE METABOLIC PANEL  LIPASE, BLOOD  URINALYSIS, ROUTINE W REFLEX MICROSCOPIC  I-STAT  TROPOININ, ED    Imaging Review No results found.   EKG Interpretation   Date/Time:  Tuesday December 24 2013 10:13:40 EST Ventricular Rate:  105 PR Interval:  124 QRS Duration: 69 QT Interval:  341 QTC Calculation: 451 R Axis:   -48 Text Interpretation:  Sinus tachycardia Left anterior fascicular block  Anterior infarct, old Baseline wander in lead(s) II III aVF No significant  change since last tracing Confirmed by Bebe ShaggyWICKLINE  MD, DONALD ([id number]) on  12/24/2013 10:23:09 AM      CRITICAL CARE Performed by: Arthor CaptainHarris, Cloey Sferrazza   Total critical care time: 30  Critical care time was exclusive of separately billable procedures and treating other patients.  Critical care was necessary to treat or prevent imminent or life-threatening deterioration.  Critical care was time spent personally by me on the following activities: development of treatment plan with patient and/or surrogate as well as nursing, discussions with consultants, evaluation of patient's response to treatment, examination of patient, obtaining history from  patient or surrogate, ordering and performing treatments and interventions, ordering and review of laboratory studies, ordering and review of radiographic studies, pulse oximetry and re-evaluation of patient's condition.  MDM   Final diagnoses:  None    Patient here with potassium 7.1 Given potassium, insulin, and calcium gluconate. Consult to nephrology.   11:54 AM BP 146/71 mmHg  Pulse 104  Temp(Src) 98.2 F (36.8 C) (Oral)  Resp 12  SpO2 100% Patient with acute hyperkalemia. She will be admitted to telemetry. Dr. Arlean Hopping will dialyze the patient, Dr. Blake Divine has admitted for observation. Pt stable in ED with no significant deterioration in condition. No acute findings on EKG.  I personally reviewed the imaging tests through PACS system. I have reviewed and interpreted Lab values. I reviewed available ER/hospitalization records through the EMR     Arthor Captain, [place] 12/24/13 1156

## 2013-12-24 NOTE — ED Notes (Signed)
Chem 8 results given to Dr. Jeraldine Loots

## 2013-12-24 NOTE — ED Notes (Signed)
Lab called. Patient needs a CMP and Lipase redraw because the blood is Hemolyzed.

## 2013-12-24 NOTE — Consult Note (Signed)
Indication for Consultation:  Management of ESRD/hemodialysis; anemia, hypertension/volume and secondary hyperparathyroidism  HPI: Elizabeth [name] is a 68 y.o. female who presented to the ED this am with complaints of 'not feeling well." She receives HD TTS @ [facility], history of HTN, CAD, neurogenic bladder with indwelling foley. In the past 2 weeks she has missed every other HD, vague complaints of weakness and abdominal pain. She reports diarrhea "all night" started yesterday afternoon, no recent antibiotics, cdiff pending. She has had an indwelling foley catheter for 3 months, for urinary retention/nuerogenic bladder, she has missed multiple appts for urology follow up. K+ 7.1 in ED, will order HD now.   Past Medical History  Diagnosis Date  . Hyperlipidemia   . Urinary retention     DX NEUROGENIC BLADDER--HAS INDWELLING FOLEY CATHETER  . Arthritis   . Anemia   . CAD (coronary artery disease)   . Neuropathic pain   . Shortness of breath     WITH EXERTION  . Stroke     patient not aware.  . Foley catheter in place     for urinary retention  . Critical lower limb ischemia   . Hypertension   . Peripheral vascular disease   . Hypoglycemia 08/19/2013  . Hypokalemia 08/19/2013  . Chronic kidney disease     PT TOLD RECENT TESTING SHOWS KIDNEY DAMAGE--IS SEEING NEPHROLOGIST.  OFFICE NOTES OBTAINED DR. MATTINGLY -"CHRONIC KIDNEY DISEASE 4 MOST LIKELY SECONDARY TO DIABETES"  . Diabetes mellitus without complication     TAKES INSULIN    Past Surgical History  Procedure Laterality Date  . No previous surgery    . Insertion of suprapubic catheter N/A 05/16/2012    Procedure: INSERTION OF SUPRAPUBIC CATHETER;  Surgeon: Sebastian Ache, MD;  Location: [facility];  Service: Urology;  Laterality: N/A;  . Bascilic vein transposition Left 10/30/2012    Procedure: LEFT 1ST STAGE BASCILIC VEIN TRANSPOSITION;  Surgeon: Chuck Hint, MD;  Location: [facility] OR;  Service: Vascular;  Laterality: Left;  . Nm  myocar perf wall motion  04/19/2012    low risk study-EF 44%,mild inferolateral hypkinesis  . Bascilic vein transposition Left 02/05/2013    Procedure: BASCILIC VEIN TRANSPOSITION 2ND STAGE;  Surgeon: Chuck Hint, MD;  Location: [facility] OR;  Service: Vascular;  Laterality: Left;  . Radiology with anesthesia N/A 10/25/2013    Procedure: RADIOLOGY WITH ANESTHESIA;  Surgeon: Medication Radiologist, MD;  Location: [facility] OR;  Service: Radiology;  Laterality: N/A;   Family History  Problem Relation Age of Onset  . Hyperlipidemia Mother   . Hypertension Mother   . Hodgkin's lymphoma Father   . Heart disease Sister    Social History:  reports that she has never smoked. She has never used smokeless tobacco. She reports that she does not drink alcohol or use illicit drugs. No Known Allergies Prior to Admission medications   Medication Sig Start Date End Date Taking? Authorizing Provider  Amino Acids-Protein Hydrolys (FEEDING SUPPLEMENT, PRO-STAT SUGAR FREE 64,) LIQD Take 30 mLs by mouth daily.   Yes Historical Provider, MD  Ascorbic Acid (VITAMIN C PO) Take 150 mg by mouth 2 (two) times daily.   Yes Historical Provider, MD  aspirin EC 81 MG tablet Take 81 mg by mouth daily.   Yes Historical Provider, MD  collagenase (SANTYL) ointment Apply 1 application topically every evening. Apply to sacrum every evening. Clean ulcer with wound cleanser apply antyl and cover with dry dressing   Yes Historical Provider, MD  insulin aspart (NOVOLOG)  100 UNIT/ML injection Inject 0-12 Units into the skin 2 (two) times daily. Sliding scale: 0-149=0; 150-199=2; 200-249=4; 250-299=6; 300-349=8; 350-399=10; 400-499=12; 450> = Call MD   Yes Historical Provider, MD  insulin glargine (LANTUS) 100 UNIT/ML injection Inject 0.05 mLs (5 Units total) into the skin at bedtime. Patient taking differently: Inject 7 Units into the skin at bedtime.  10/24/13  Yes Belkys A Regalado, MD  multivitamin (RENA-VIT) TABS tablet Take 1 tablet  by mouth daily.   Yes Historical Provider, MD  Nutritional Supplements (FEEDING SUPPLEMENT, NEPRO CARB STEADY,) LIQD Take 237 mLs by mouth as needed (missed meal during dialysis.). Patient taking differently: Take 237 mLs by mouth daily.  10/24/13  Yes Belkys A Regalado, MD  pravastatin (PRAVACHOL) 20 MG tablet Take 20 mg by mouth at bedtime.    Yes Historical Provider, MD  zinc sulfate 220 MG capsule Take 220 mg by mouth daily.   Yes Historical Provider, MD  cephALEXin (KEFLEX) 250 MG capsule Take 1 capsule (250 mg total) by mouth 2 (two) times daily. 10/24/13   Belkys A Regalado, MD  glucose blood (ONETOUCH VERIO) test strip Use as instructed to check blood sugar 4 times per day dx code 250.00 09/16/13   Reather LittlerAjay Kumar, MD  [facility] DEL[facility] LANC[facility] FINE MISC Use to check blood sugar 4 times per day 09/16/13   Reather LittlerAjay Kumar, MD   No current facility-administered medications for this encounter.   Current Outpatient Prescriptions  Medication Sig Dispense Refill  . Amino Acids-Protein Hydrolys (FEEDING SUPPLEMENT, PRO-STAT SUGAR FREE 64,) LIQD Take 30 mLs by mouth daily.    . Ascorbic Acid (VITAMIN C PO) Take 150 mg by mouth 2 (two) times daily.    Marland Kitchen. aspirin EC 81 MG tablet Take 81 mg by mouth daily.    . collagenase (SANTYL) ointment Apply 1 application topically every evening. Apply to sacrum every evening. Clean ulcer with wound cleanser apply antyl and cover with dry dressing    . insulin aspart (NOVOLOG) 100 UNIT/ML injection Inject 0-12 Units into the skin 2 (two) times daily. Sliding scale: 0-149=0; 150-199=2; 200-249=4; 250-299=6; 300-349=8; 350-399=10; 400-499=12; 450> = Call MD    . insulin glargine (LANTUS) 100 UNIT/ML injection Inject 0.05 mLs (5 Units total) into the skin at bedtime. (Patient taking differently: Inject 7 Units into the skin at bedtime. ) 10 mL 11  . multivitamin (RENA-VIT) TABS tablet Take 1 tablet by mouth daily.    . Nutritional Supplements (FEEDING SUPPLEMENT, NEPRO CARB STEADY,)  LIQD Take 237 mLs by mouth as needed (missed meal during dialysis.). (Patient taking differently: Take 237 mLs by mouth daily. ) 30 Can 0  . pravastatin (PRAVACHOL) 20 MG tablet Take 20 mg by mouth at bedtime.     Marland Kitchen. zinc sulfate 220 MG capsule Take 220 mg by mouth daily.    . cephALEXin (KEFLEX) 250 MG capsule Take 1 capsule (250 mg total) by mouth 2 (two) times daily. 14 capsule 0  . glucose blood (ONETOUCH VERIO) test strip Use as instructed to check blood sugar 4 times per day dx code 250.00 150 each 3  . ONETOUCH DELICA LANCETS FINE MISC Use to check blood sugar 4 times per day 200 each 3   Labs: Basic Metabolic Panel:  Recent Labs Lab 12/24/13 1041  NA 137  K 7.1*  CL 105  GLUCOSE 87  BUN 70*  CREATININE 3.80*   Liver Function Tests: No results for input(s): AST, ALT, ALKPHOS, BILITOT, PROT, ALBUMIN in the last 168  hours. No results for input(s): LIPASE, AMYLASE in the last 168 hours. No results for input(s): AMMONIA in the last 168 hours. CBC:  Recent Labs Lab 12/24/13 1005 12/24/13 1041  WBC 7.5  --   NEUTROABS 5.5  --   HGB 9.9* 12.2  HCT 32.5* 36.0  MCV 79.9  --   PLT 264  --    Cardiac Enzymes: No results for input(s): CKTOTAL, CKMB, CKMBINDEX, TROPONINI in the last 168 hours. CBG: No results for input(s): GLUCAP in the last 168 hours. Iron Studies: No results for input(s): IRON, TIBC, TRANSFERRIN, FERRITIN in the last 72 hours. Studies/Results: Dg Abd Acute W/chest  12/24/2013   CLINICAL DATA:  Left lower quadrant abdominal pain. Vomiting. Weakness.  EXAM: ACUTE ABDOMEN SERIES (ABDOMEN 2 VIEW & CHEST 1 VIEW)  COMPARISON:  Two-view chest x-ray 10/18/2013.  FINDINGS: A right IJ dialysis catheter is stable in position. The heart size is normal. Mild interstitial coarsening is chronic.  Supine and decubitus views the abdomen demonstrate a nonspecific bowel gas pattern. There is no evidence for obstruction or free air. Atherosclerotic calcifications are noted in the  aorta and iliac arteries. Degenerative changes of the lumbar spine are most significant at L3-4 and L4-5.  IMPRESSION: 1. Mild interstitial prominence appears chronic. 2. No acute chest or abdominal process. 3. Atherosclerosis. 4. Right IJ dialysis catheter.   Electronically Signed   By: Gennette Pac M.D.   On: 12/24/2013 11:54    Review of Systems: Gen: Reports fair appetite. Denies any fever, chills, fatigue, weakness HEENT: No visual complaints, No history of Retinopathy. Normal external appearance No Epistaxis or Sore throat. No sinusitis.   CV: Denies chest pain, angina, palpitations, syncope, orthopnea, PND, peripheral edema, and claudication. Resp: Denies dyspnea at rest, dyspnea with exercise, cough, sputum, wheezing, coughing up blood, and pleurisy. GI: Reports diarrhea since last night, occasional abdominal pain. Denies vomiting, blood in stool GU : Indwelling foley x3 months MS: sacral pain d/t wound from fall in august. Derm: Denies rash, itching, dry skin, hives, moles, warts, or unhealing ulcers.  Psych: Denies depression, anxiety, memory loss, suicidal ideation, hallucinations, paranoia, and confusion. Heme: Denies bruising, bleeding, and enlarged lymph nodes. Neuro: No headache.  No diplopia. No dysarthria.  No dysphasia.  No history of CVA.  No Seizures. No paresthesias.  No weakness. Endocrine   No Thyroid disease.  No Adrenal disease.  Physical Exam: Filed Vitals:   12/24/13 1019 12/24/13 1100 12/24/13 1112 12/24/13 1152  BP: 149/77  146/71   Pulse: 106 102 103 104  Temp: 98.2 F (36.8 C)     TempSrc: Oral     Resp: 18 18 18 12   SpO2: 99% 100% 100% 100%     General:  Thin, frail. No acute distress.  Head: Normocephalic, atraumatic, sclera non-icteric, mucus membranes are moist. Poor dentation Neck: Supple. JVD not elevated. No carotid bruits  Lungs: diminished bases bilat. Breathing is unlabored. Heart: Tachy. RRR with S1 S2. No murmurs, rubs, or gallops  appreciated. Abdomen: Soft, mild tenderness, non-distended with normoactive bowel sounds. No rebound/guarding. No obvious abdominal masses. M-S:  Appears weak Lower extremities:without edema or ischemic changes,L knee wound, dressing intact Neuro: Alert and oriented X 3. Moves all extremities spontaneously. Psych:  Responds to questions appropriately with a normal affect. Dialysis Access: R IJ cath. L AVF +b/t  Dialysis Orders:  TTS [place] 37 kgs  3 hr  2k/2.25ca 3900 Heparin R IJ and L AVF  hectorol 2 mcg IV/HD aranesp  160 q Thursday     No Fe Profile 4  Assessment/Plan: 1.  Diarrhea- cdiff pending. Denies fever 2.  ESRD -  TTS [facility]. Recently missing about half her treatments- discussed risks. K+ 7.1 HD now.  3.  Hypertension/volume  - 156/62. No home BP meds. Small volume gains outpt, getting about 0.5L removed recently 4.  Anemia  - hgb 9.9, cont Aranesp Thursday- last dose 10/29. Labs from 10/29 tsat 12, ferritin 1821- start small bolus then DC 5.  Metabolic bone disease -  corrected Ca+11.3.  pth 28 hold hectorol.. Last phos 3.4- decrease renvela to 1 TID.  6.  Nutrition - alb 1.6, renal diet. Multi vit. Needs supplements 7. Neurogenic bladde- with indwelling foley. Missed outpt followups with urology. Believe foley was last exchanged around 8/29. UA pending  8. DM- per primary 9. Access- just started using AVF with 1 needle 10/29. Use cath today but resume advancing AVF next tx  Jetty Duhamel, NP [facility] [phone] 12/24/2013, 12:03 PM     Pt seen, examined and agree w A/P as above. ESRD pt has been missing some HD recently presents with diarrhea and has hyperkalemia on labs in ED. Plan HD today with low K bath.  Will follow.  Vinson Moselle MD pager [phone]    cell [phone] 12/24/2013, 1:35 PM

## 2013-12-24 NOTE — H&P (Signed)
[facility] History and Physical  Vega Bainter XTK:[id number] DOB: [birth year] DOA: 12/24/2013  Referring physician: EDP PCP: [name], MD   Chief Complaint: missed HD and nausea and diarrhea  HPI: Elizabeth [name] is a 68 y.o. female with prior h/o hypertension, ESRD, on HD, CAD, stage 4 sacral decubitus,  neurogenic bladder , recently treated for UTI, comes in for not feeling well, diarrhea since Thursday, missed HD on Saturday,. She also reports nausea since 5 days. On arrival to ED, she was found to have potassium of 7.1 and was referred to medical service for admission. Renal consulted for Urgent HD today.   Review of Systems:  Constitutional:  No weight loss, night sweats, Fevers, chills, fatigue.  HEENT:  No headaches, Difficulty swallowing,Tooth/dental problems,Sore throat,  No sneezing, itching, ear ache, nasal congestion, post nasal drip,  Cardio-vascular:  No chest pain, Orthopnea, PND, swelling in lower extremities, anasarca, dizziness, palpitations  GI:  Nasuea, diarrhea, and loss of appetite since Thursday.  Resp:  No shortness of breath with exertion or at rest. No excess mucus, no productive cough, No non-productive cough, No coughing up of blood.No change in color of mucus.No wheezing.No chest wall deformity  Skin:  Stage 4 sacral decubitus.  GU:  no dysuria,  Musculoskeletal:  No joint pain or swelling. No decreased range of motion. No back pain.  Psych:  No change in mood or affect. No depression or anxiety. No memory loss.   Past Medical History  Diagnosis Date  . Hyperlipidemia   . Urinary retention     DX NEUROGENIC BLADDER--HAS INDWELLING FOLEY CATHETER  . Arthritis   . Anemia   . CAD (coronary artery disease)   . Neuropathic pain   . Shortness of breath     WITH EXERTION  . Stroke     patient not aware.  . Foley catheter in place     for urinary retention  . Critical lower limb ischemia   . Hypertension   . Peripheral vascular disease    . Hypoglycemia 08/19/2013  . Hypokalemia 08/19/2013  . Chronic kidney disease     PT TOLD RECENT TESTING SHOWS KIDNEY DAMAGE--IS SEEING NEPHROLOGIST.  OFFICE NOTES OBTAINED DR. MATTINGLY -"CHRONIC KIDNEY DISEASE 4 MOST LIKELY SECONDARY TO DIABETES"  . Diabetes mellitus without complication     TAKES INSULIN    Past Surgical History  Procedure Laterality Date  . No previous surgery    . Insertion of suprapubic catheter N/A 05/16/2012    Procedure: INSERTION OF SUPRAPUBIC CATHETER;  Surgeon: Sebastian Ache, MD;  Location: [facility];  Service: Urology;  Laterality: N/A;  . Bascilic vein transposition Left 10/30/2012    Procedure: LEFT 1ST STAGE BASCILIC VEIN TRANSPOSITION;  Surgeon: Chuck Hint, MD;  Location: [facility] OR;  Service: Vascular;  Laterality: Left;  . Nm myocar perf wall motion  04/19/2012    low risk study-EF 44%,mild inferolateral hypkinesis  . Bascilic vein transposition Left 02/05/2013    Procedure: BASCILIC VEIN TRANSPOSITION 2ND STAGE;  Surgeon: Chuck Hint, MD;  Location: [facility] OR;  Service: Vascular;  Laterality: Left;  . Radiology with anesthesia N/A 10/25/2013    Procedure: RADIOLOGY WITH ANESTHESIA;  Surgeon: Medication Radiologist, MD;  Location: [facility] OR;  Service: Radiology;  Laterality: N/A;   Social History:  reports that she has never smoked. She has never used smokeless tobacco. She reports that she does not drink alcohol or use illicit drugs.  No Known Allergies  Family History  Problem Relation Age of Onset  .  Hyperlipidemia Mother   . Hypertension Mother   . Hodgkin's lymphoma Father   . Heart disease Sister      Prior to Admission medications   Medication Sig Start Date End Date Taking? Authorizing Provider  Amino Acids-Protein Hydrolys (FEEDING SUPPLEMENT, PRO-STAT SUGAR FREE 64,) LIQD Take 30 mLs by mouth daily.   Yes Historical Provider, MD  Ascorbic Acid (VITAMIN C PO) Take 150 mg by mouth 2 (two) times daily.   Yes Historical Provider, MD    aspirin EC 81 MG tablet Take 81 mg by mouth daily.   Yes Historical Provider, MD  collagenase (SANTYL) ointment Apply 1 application topically every evening. Apply to sacrum every evening. Clean ulcer with wound cleanser apply antyl and cover with dry dressing   Yes Historical Provider, MD  insulin aspart (NOVOLOG) 100 UNIT/ML injection Inject 0-12 Units into the skin 2 (two) times daily. Sliding scale: 0-149=0; 150-199=2; 200-249=4; 250-299=6; 300-349=8; 350-399=10; 400-499=12; 450> = Call MD   Yes Historical Provider, MD  insulin glargine (LANTUS) 100 UNIT/ML injection Inject 0.05 mLs (5 Units total) into the skin at bedtime. Patient taking differently: Inject 7 Units into the skin at bedtime.  10/24/13  Yes Belkys A Regalado, MD  multivitamin (RENA-VIT) TABS tablet Take 1 tablet by mouth daily.   Yes Historical Provider, MD  Nutritional Supplements (FEEDING SUPPLEMENT, NEPRO CARB STEADY,) LIQD Take 237 mLs by mouth as needed (missed meal during dialysis.). Patient taking differently: Take 237 mLs by mouth daily.  10/24/13  Yes Belkys A Regalado, MD  pravastatin (PRAVACHOL) 20 MG tablet Take 20 mg by mouth at bedtime.    Yes Historical Provider, MD  zinc sulfate 220 MG capsule Take 220 mg by mouth daily.   Yes Historical Provider, MD  cephALEXin (KEFLEX) 250 MG capsule Take 1 capsule (250 mg total) by mouth 2 (two) times daily. 10/24/13   Belkys A Regalado, MD  glucose blood (ONETOUCH VERIO) test strip Use as instructed to check blood sugar 4 times per day dx code 250.00 09/16/13   [name], MD  [facility] DELICA LANCETS FINE MISC Use to check blood sugar 4 times per day 09/16/13   [name], MD   Physical Exam: Filed Vitals:   12/24/13 1300 12/24/13 1330 12/24/13 1400 12/24/13 1430  BP: 150/78 121/69 90/59 82/39   Pulse: 106 102 100 94  Temp:      TempSrc:      Resp: 13 12 14 14   SpO2:        Wt Readings from Last 3 Encounters:  10/24/13 41 kg (90 lb 6.2 oz)  10/02/13 38.374 kg (84 lb 9.6 oz)   09/16/13 37.921 kg (83 lb 9.6 oz)    General:  Appears calm and comfortable Eyes: PERRL, normal lids, irises & conjunctiva Neck: no LAD, masses or thyromegaly Cardiovascular: RRR, no m/r/g. No LE edema. Respiratory: good air entry bilateral. gastrointestinal soft, mild generalized tenderness, bowel sounds heard. Not distended.  Skin: stage 4 decubitus ulcer.  Musculoskeletal: grossly normal tone BUE/BLE Neurologic: grossly non-focal.          Labs on Admission:  Basic Metabolic Panel:  Recent Labs Lab 12/24/13 1041 12/24/13 1123 12/24/13 1302  NA 137 138 141  K 7.1* 5.0 4.2  CL 105 102 104  CO2  --  22 25  GLUCOSE 87 90 186*  BUN 70* 44* 42*  CREATININE 3.80* 3.41* 3.44*  CALCIUM  --  9.4 9.3  PHOS  --   --  2.6  Liver Function Tests:  Recent Labs Lab 12/24/13 1123 12/24/13 1302  AST 12  --   ALT 12  --   ALKPHOS 83  --   BILITOT <0.2*  --   PROT 6.5  --   ALBUMIN 1.6* 1.7*    Recent Labs Lab 12/24/13 1123  LIPASE 6*   No results for input(s): AMMONIA in the last 168 hours. CBC:  Recent Labs Lab 12/24/13 1005 12/24/13 1041 12/24/13 1302  WBC 7.5  --  9.1  NEUTROABS 5.5  --   --   HGB 9.9* 12.2 8.8*  HCT 32.5* 36.0 29.2*  MCV 79.9  --  83.0  PLT 264  --  281   Cardiac Enzymes: No results for input(s): CKTOTAL, CKMB, CKMBINDEX, TROPONINI in the last 168 hours.  BNP (last 3 results) No results for input(s): PROBNP in the last 8760 hours. CBG: No results for input(s): GLUCAP in the last 168 hours.  Radiological Exams on Admission: Dg Abd Acute W/chest  12/24/2013   CLINICAL DATA:  Left lower quadrant abdominal pain. Vomiting. Weakness.  EXAM: ACUTE ABDOMEN SERIES (ABDOMEN 2 VIEW & CHEST 1 VIEW)  COMPARISON:  Two-view chest x-ray 10/18/2013.  FINDINGS: A right IJ dialysis catheter is stable in position. The heart size is normal. Mild interstitial coarsening is chronic.  Supine and decubitus views the abdomen demonstrate a nonspecific bowel  gas pattern. There is no evidence for obstruction or free air. Atherosclerotic calcifications are noted in the aorta and iliac arteries. Degenerative changes of the lumbar spine are most significant at L3-4 and L4-5.  IMPRESSION: 1. Mild interstitial prominence appears chronic. 2. No acute chest or abdominal process. 3. Atherosclerosis. 4. Right IJ dialysis catheter.   Electronically Signed   By: Gennette Pachris  Mattern M.D.   On: 12/24/2013 11:54    EKG: sinus tachycardia, independently reviewed.   Assessment/Plan Active Problems:   Acute hyperkalemia   Hyperkalemia   Hyperkalemia;  - probably a combination of missing HD and dehydration.  Under went HD and repeat K is normal.   ESRD on HD: - TTS, renal on board and consulting.   Diarrhea, nausea, : Recently treated for UTI, evaluate for C DIFF. pcr pending.  Symptomatic treatment.    Stage 4 sacral decubitus; Pt reports she gets Scientist[employer] research (life sciences)hydro tomorrow as outpatient. Will consult wound care for recommendations.    Hypertension: controlled.   Anemia: normocytic.  Baseline hemoglobin is around 11. Her Hemoglobin dropped to 8.8 today. Will get stool for occult blood and anemia panel.  Transfuse to keep hemoglobin greater than 7.   Code Status: full code.  DVT Prophylaxis: Family Communication: none at bedside.  Disposition Plan: admit to tele  Time spent: 60 minutes.   [facility],Lehman Whiteley [facility] Pager [phone]

## 2013-12-24 NOTE — ED Notes (Addendum)
Patient has two decubitus ulcers, one to the upper back and a sacral ulcer.  The sacral ulcer was witnessed by this RN and Dr. Bebe Shaggy.  The dressing on the sacral had scant drainage, with drainage and redness around the area.  The bandage was redressed with the same dressing on the sacral area from home.  The ulcer on the back was not assessed other than the dressing, not changed.

## 2013-12-24 NOTE — Progress Notes (Signed)
Admitted patient from E.D. via hemodialysis,from [facility].Alert and oriented but confused on situation.Chronic indwelling foley catheter user, secondary to neurogenic bladder due to bladder cancer.(per report catheter was placed three weeks ago.)Left upper arm fistula has a tight dressing wrapped around the entire arm at the level of dialysis access-positive for thrill and bruit.At present,they using the right hemodialysis catheter.Contractures noted on the great and first toes of both feet.Some major pressure ulcers on patient's skin were assessed,measured and documented on the flowsheet. On top of the left great toe-unstageable wound 2 x 1.5 cm.On top of the left first toe-unstageable wound 1 x1.2cm. On top of right great toe-unstageable wound 5 x .75 cm.Unstageable wound on the left ankle 1 x 1cm.Scattered unstageable wound on inner lateral feet were observed.

## 2013-12-24 NOTE — ED Provider Notes (Signed)
Acute hyperKalemia noted (K=7) IV insulin/dextrose/calcium ordered Nephrology has been consulted   Joya Gaskins, MD 12/24/13 1047

## 2013-12-24 NOTE — ED Notes (Signed)
Pt returned from xray. MD at bedside.

## 2013-12-24 NOTE — ED Provider Notes (Signed)
D/w schertz He will dialyze patient Recommends admission   Joya Gaskins, MD 12/24/13 1116

## 2013-12-24 NOTE — ED Notes (Signed)
Phlebotomy at the bedside  

## 2013-12-24 NOTE — ED Notes (Signed)
Patient transferred upstairs to Dialysis by Noreene Larsson, NT.

## 2013-12-24 NOTE — ED Notes (Signed)
Elizabeth [name] made aware of the need for the patient to have blood redrawn.

## 2013-12-24 NOTE — ED Notes (Addendum)
Hospitalist at the bedside. Patient returned from Xray.

## 2013-12-24 NOTE — Procedures (Signed)
I was present at this dialysis session, have reviewed the session itself and made  appropriate changes  Vinson Moselle MD (pgr) [phone]    [phone] 12/24/2013, 1:37 PM

## 2013-12-24 NOTE — ED Notes (Signed)
Pt presents to department via [facility] from [facility] for evaluation of diarrhea x2 days. Pt is hemodialysis patient, last treatment unknown, pt refusing treatments at the time. Pt states "I don't feel like going to dialysis." wound to sacral area from fall several months ago. Pt is alert and oriented x4.

## 2013-12-25 ENCOUNTER — Encounter (HOSPITAL_BASED_OUTPATIENT_CLINIC_OR_DEPARTMENT_OTHER): Payer: Medicare Other | Attending: General Surgery

## 2013-12-25 ENCOUNTER — Inpatient Hospital Stay (HOSPITAL_COMMUNITY): Payer: Medicare Other

## 2013-12-25 DIAGNOSIS — E1310 Other specified diabetes mellitus with ketoacidosis without coma: Secondary | ICD-10-CM | POA: Diagnosis present

## 2013-12-25 DIAGNOSIS — Z87440 Personal history of urinary (tract) infections: Secondary | ICD-10-CM | POA: Diagnosis not present

## 2013-12-25 DIAGNOSIS — Z992 Dependence on renal dialysis: Secondary | ICD-10-CM | POA: Diagnosis not present

## 2013-12-25 DIAGNOSIS — D638 Anemia in other chronic diseases classified elsewhere: Secondary | ICD-10-CM | POA: Diagnosis present

## 2013-12-25 DIAGNOSIS — E785 Hyperlipidemia, unspecified: Secondary | ICD-10-CM | POA: Diagnosis present

## 2013-12-25 DIAGNOSIS — R627 Adult failure to thrive: Secondary | ICD-10-CM | POA: Diagnosis present

## 2013-12-25 DIAGNOSIS — E43 Unspecified severe protein-calorie malnutrition: Secondary | ICD-10-CM | POA: Diagnosis present

## 2013-12-25 DIAGNOSIS — E1165 Type 2 diabetes mellitus with hyperglycemia: Secondary | ICD-10-CM | POA: Diagnosis present

## 2013-12-25 DIAGNOSIS — R5381 Other malaise: Secondary | ICD-10-CM | POA: Diagnosis present

## 2013-12-25 DIAGNOSIS — R197 Diarrhea, unspecified: Secondary | ICD-10-CM | POA: Diagnosis present

## 2013-12-25 DIAGNOSIS — M1990 Unspecified osteoarthritis, unspecified site: Secondary | ICD-10-CM | POA: Diagnosis present

## 2013-12-25 DIAGNOSIS — E860 Dehydration: Secondary | ICD-10-CM | POA: Diagnosis present

## 2013-12-25 DIAGNOSIS — Z681 Body mass index (BMI) 19 or less, adult: Secondary | ICD-10-CM | POA: Diagnosis not present

## 2013-12-25 DIAGNOSIS — N390 Urinary tract infection, site not specified: Secondary | ICD-10-CM | POA: Diagnosis present

## 2013-12-25 DIAGNOSIS — N319 Neuromuscular dysfunction of bladder, unspecified: Secondary | ICD-10-CM | POA: Diagnosis present

## 2013-12-25 DIAGNOSIS — N186 End stage renal disease: Secondary | ICD-10-CM | POA: Diagnosis present

## 2013-12-25 DIAGNOSIS — L97829 Non-pressure chronic ulcer of other part of left lower leg with unspecified severity: Secondary | ICD-10-CM | POA: Diagnosis present

## 2013-12-25 DIAGNOSIS — I120 Hypertensive chronic kidney disease with stage 5 chronic kidney disease or end stage renal disease: Secondary | ICD-10-CM | POA: Diagnosis present

## 2013-12-25 DIAGNOSIS — A419 Sepsis, unspecified organism: Secondary | ICD-10-CM | POA: Diagnosis present

## 2013-12-25 DIAGNOSIS — Z8673 Personal history of transient ischemic attack (TIA), and cerebral infarction without residual deficits: Secondary | ICD-10-CM | POA: Diagnosis not present

## 2013-12-25 DIAGNOSIS — L89154 Pressure ulcer of sacral region, stage 4: Secondary | ICD-10-CM | POA: Diagnosis present

## 2013-12-25 DIAGNOSIS — B9620 Unspecified Escherichia coli [E. coli] as the cause of diseases classified elsewhere: Secondary | ICD-10-CM | POA: Diagnosis present

## 2013-12-25 DIAGNOSIS — N2581 Secondary hyperparathyroidism of renal origin: Secondary | ICD-10-CM | POA: Diagnosis present

## 2013-12-25 DIAGNOSIS — E875 Hyperkalemia: Secondary | ICD-10-CM | POA: Diagnosis present

## 2013-12-25 DIAGNOSIS — I2510 Atherosclerotic heart disease of native coronary artery without angina pectoris: Secondary | ICD-10-CM | POA: Diagnosis present

## 2013-12-25 DIAGNOSIS — Z7982 Long term (current) use of aspirin: Secondary | ICD-10-CM | POA: Diagnosis not present

## 2013-12-25 DIAGNOSIS — Z794 Long term (current) use of insulin: Secondary | ICD-10-CM | POA: Diagnosis not present

## 2013-12-25 DIAGNOSIS — R64 Cachexia: Secondary | ICD-10-CM | POA: Diagnosis present

## 2013-12-25 LAB — BASIC METABOLIC PANEL
Anion gap: 11 (ref 5–15)
BUN: 22 mg/dL (ref 6–23)
CO2: 26 meq/L (ref 19–32)
Calcium: 8.4 mg/dL (ref 8.4–10.5)
Chloride: 97 mEq/L (ref 96–112)
Creatinine, Ser: 1.93 mg/dL — ABNORMAL HIGH (ref 0.50–1.10)
GFR calc Af Amer: 29 mL/min — ABNORMAL LOW (ref >90)
GFR calc non Af Amer: 25 mL/min — ABNORMAL LOW (ref >90)
GLUCOSE: 141 mg/dL — AB (ref 70–99)
POTASSIUM: 4.5 meq/L (ref 3.7–5.3)
SODIUM: 134 meq/L — AB (ref 137–147)

## 2013-12-25 LAB — HEMOGLOBIN AND HEMATOCRIT, BLOOD
HCT: 28.1 % — ABNORMAL LOW (ref 36.0–46.0)
Hemoglobin: 8.6 g/dL — ABNORMAL LOW (ref 12.0–15.0)

## 2013-12-25 LAB — URINE MICROSCOPIC-ADD ON

## 2013-12-25 LAB — CBC
HCT: 28 % — ABNORMAL LOW (ref 36.0–46.0)
HEMOGLOBIN: 8.5 g/dL — AB (ref 12.0–15.0)
MCH: 24.4 pg — ABNORMAL LOW (ref 26.0–34.0)
MCHC: 30.4 g/dL (ref 30.0–36.0)
MCV: 80.5 fL (ref 78.0–100.0)
Platelets: 196 10*3/uL (ref 150–400)
RBC: 3.48 MIL/uL — AB (ref 3.87–5.11)
RDW: 19.1 % — ABNORMAL HIGH (ref 11.5–15.5)
WBC: 8.4 10*3/uL (ref 4.0–10.5)

## 2013-12-25 LAB — IRON AND TIBC
Iron: 34 ug/dL — ABNORMAL LOW (ref 42–135)
Saturation Ratios: 28 % (ref 20–55)
TIBC: 120 ug/dL — ABNORMAL LOW (ref 250–470)
UIBC: 86 ug/dL — AB (ref 125–400)

## 2013-12-25 LAB — URINALYSIS, ROUTINE W REFLEX MICROSCOPIC
Bilirubin Urine: NEGATIVE
GLUCOSE, UA: NEGATIVE mg/dL
KETONES UR: NEGATIVE mg/dL
Nitrite: NEGATIVE
Specific Gravity, Urine: 1.02 (ref 1.005–1.030)
Urobilinogen, UA: 0.2 mg/dL (ref 0.0–1.0)
pH: 8 (ref 5.0–8.0)

## 2013-12-25 LAB — GLUCOSE, CAPILLARY
GLUCOSE-CAPILLARY: 157 mg/dL — AB (ref 70–99)
GLUCOSE-CAPILLARY: 103 mg/dL — AB (ref 70–99)
Glucose-Capillary: 176 mg/dL — ABNORMAL HIGH (ref 70–99)
Glucose-Capillary: 162 mg/dL — ABNORMAL HIGH (ref 70–99)

## 2013-12-25 LAB — RETICULOCYTES
RBC.: 3.49 MIL/uL — AB (ref 3.87–5.11)
RETIC COUNT ABSOLUTE: 66.3 10*3/uL (ref 19.0–186.0)
Retic Ct Pct: 1.9 % (ref 0.4–3.1)

## 2013-12-25 MED ORDER — VANCOMYCIN HCL IN DEXTROSE 750-5 MG/150ML-% IV SOLN
750.0000 mg | Freq: Once | INTRAVENOUS | Status: AC
  Administered 2013-12-25: 750 mg via INTRAVENOUS
  Filled 2013-12-25: qty 150

## 2013-12-25 MED ORDER — HEPARIN SODIUM (PORCINE) 5000 UNIT/ML IJ SOLN
5000.0000 [IU] | Freq: Two times a day (BID) | INTRAMUSCULAR | Status: DC
  Administered 2013-12-26 – 2013-12-27 (×4): 5000 [IU] via SUBCUTANEOUS
  Filled 2013-12-25 (×7): qty 1

## 2013-12-25 MED ORDER — ENOXAPARIN SODIUM 30 MG/0.3ML ~~LOC~~ SOLN
20.0000 mg | SUBCUTANEOUS | Status: DC
  Filled 2013-12-25: qty 0.2

## 2013-12-25 MED ORDER — PIPERACILLIN-TAZOBACTAM IN DEX 2-0.25 GM/50ML IV SOLN
2.2500 g | Freq: Three times a day (TID) | INTRAVENOUS | Status: DC
Start: 2013-12-25 — End: 2013-12-28
  Administered 2013-12-25 – 2013-12-28 (×9): 2.25 g via INTRAVENOUS
  Filled 2013-12-25 (×12): qty 50

## 2013-12-25 MED ORDER — ACETAMINOPHEN 325 MG PO TABS
650.0000 mg | ORAL_TABLET | ORAL | Status: DC | PRN
  Administered 2013-12-25: 650 mg via ORAL
  Filled 2013-12-25: qty 2

## 2013-12-25 MED ORDER — INSULIN ASPART 100 UNIT/ML ~~LOC~~ SOLN
0.0000 [IU] | Freq: Three times a day (TID) | SUBCUTANEOUS | Status: DC
  Administered 2013-12-25 – 2013-12-27 (×3): 2 [IU] via SUBCUTANEOUS

## 2013-12-25 MED ORDER — VANCOMYCIN HCL 500 MG IV SOLR
500.0000 mg | INTRAVENOUS | Status: DC
  Administered 2013-12-26 – 2013-12-28 (×2): 500 mg via INTRAVENOUS
  Filled 2013-12-25 (×4): qty 500

## 2013-12-25 MED ORDER — INSULIN ASPART 100 UNIT/ML ~~LOC~~ SOLN: 0.0000 [IU] | Freq: Every day | SUBCUTANEOUS | Status: DC

## 2013-12-25 NOTE — Plan of Care (Signed)
Problem: Phase I Progression Outcomes Goal: Pain controlled with appropriate interventions Outcome: Progressing     

## 2013-12-25 NOTE — Progress Notes (Signed)
UR completed 

## 2013-12-25 NOTE — Plan of Care (Signed)
Problem: Phase I Progression Outcomes Goal: Dyspnea controlled at rest Outcome: Completed/Met Date Met:  12/25/13

## 2013-12-25 NOTE — Progress Notes (Signed)
ANTIBIOTIC CONSULT NOTE - INITIAL  Pharmacy Consult for Vancomycin/ zosyn Indication: Wound Infection- Ulcer of the left knee, left hip -rule out osteomyelitis  No Known Allergies  Patient Measurements: Height: 5' 3.25" (160.7 cm) Weight: 84 lb 9.6 oz (38.374 kg) IBW/kg (Calculated) : 52.98   Vital Signs: Temp: 98.1 F (36.7 C) (11/04 0913) Temp Source: Oral (11/04 0913) BP: 122/75 mmHg (11/04 0913) Pulse Rate: 88 (11/04 0913) Intake/Output from previous day: 11/03 0701 - 11/04 0700 In: 100 [P.O.:100] Out: 1190 [Urine:190] Intake/Output from this shift: Total I/O In: 480 [P.O.:480] Out: 0   Labs:  Recent Labs  12/24/13 1005  12/24/13 1041 12/24/13 1123 12/24/13 1302 12/25/13 0545  WBC 7.5  --   --   --  9.1 8.4  HGB 9.9*  --  12.2  --  8.8* 8.5*  8.6*  PLT 264  --   --   --  281 196  CREATININE  --   < > 3.80* 3.41* 3.44* 1.93*  < > = values in this interval not displayed. Estimated Creatinine Clearance: 16.7 mL/min (by C-G formula based on Cr of 1.93). No results for input(s): VANCOTROUGH, VANCOPEAK, VANCORANDOM, GENTTROUGH, GENTPEAK, GENTRANDOM, TOBRATROUGH, TOBRAPEAK, TOBRARND, AMIKACINPEAK, AMIKACINTROU, AMIKACIN in the last 72 hours.   Microbiology: No results found for this or any previous visit (from the past 720 hour(s)).  Medical History:  ESRD on HD, TTS Past Medical History  Diagnosis Date  . Hyperlipidemia   . Urinary retention     DX NEUROGENIC BLADDER--HAS INDWELLING FOLEY CATHETER  . Arthritis   . Anemia   . CAD (coronary artery disease)   . Neuropathic pain   . Shortness of breath     WITH EXERTION  . Stroke     patient not aware.  . Foley catheter in place     for urinary retention  . Critical lower limb ischemia   . Hypertension   . Peripheral vascular disease   . Hypoglycemia 08/19/2013  . Hypokalemia 08/19/2013  . Chronic kidney disease     PT TOLD RECENT TESTING SHOWS KIDNEY DAMAGE--IS SEEING NEPHROLOGIST.  OFFICE NOTES  OBTAINED DR. MATTINGLY -"CHRONIC KIDNEY DISEASE 4 MOST LIKELY SECONDARY TO DIABETES"  . Diabetes mellitus without complication     TAKES INSULIN     Medications:  Prescriptions prior to admission  Medication Sig Dispense Refill Last Dose  . Amino Acids-Protein Hydrolys (FEEDING SUPPLEMENT, PRO-STAT SUGAR FREE 64,) LIQD Take 30 mLs by mouth daily.   12/23/2013 at Unknown time  . Ascorbic Acid (VITAMIN C PO) Take 150 mg by mouth 2 (two) times daily.   12/23/2013 at Unknown time  . aspirin EC 81 MG tablet Take 81 mg by mouth daily.   12/23/2013 at Unknown time  . collagenase (SANTYL) ointment Apply 1 application topically every evening. Apply to sacrum every evening. Clean ulcer with wound cleanser apply antyl and cover with dry dressing   12/23/2013 at Unknown time  . insulin aspart (NOVOLOG) 100 UNIT/ML injection Inject 0-12 Units into the skin 2 (two) times daily. Sliding scale: 0-149=0; 150-199=2; 200-249=4; 250-299=6; 300-349=8; 350-399=10; 400-499=12; 450> = Call MD   12/23/2013 at Unknown time  . insulin glargine (LANTUS) 100 UNIT/ML injection Inject 0.05 mLs (5 Units total) into the skin at bedtime. (Patient taking differently: Inject 7 Units into the skin at bedtime. ) 10 mL 11 12/23/2013 at Unknown time  . multivitamin (RENA-VIT) TABS tablet Take 1 tablet by mouth daily.   12/23/2013 at Unknown time  .  Nutritional Supplements (FEEDING SUPPLEMENT, NEPRO CARB STEADY,) LIQD Take 237 mLs by mouth as needed (missed meal during dialysis.). (Patient taking differently: Take 237 mLs by mouth daily. ) 30 Can 0 12/23/2013 at Unknown time  . pravastatin (PRAVACHOL) 20 MG tablet Take 20 mg by mouth at bedtime.    12/23/2013 at Unknown time  . zinc sulfate 220 MG capsule Take 220 mg by mouth daily.   12/23/2013 at Unknown time  . cephALEXin (KEFLEX) 250 MG capsule Take 1 capsule (250 mg total) by mouth 2 (two) times daily. 14 capsule 0   . glucose blood (ONETOUCH VERIO) test strip Use as instructed to check blood  sugar 4 times per day dx code 250.00 150 each 3 Taking  . ONETOUCH DELICA LANCETS FINE MISC Use to check blood sugar 4 times per day 200 each 3 Taking   Scheduled:  . aspirin EC  81 mg Oral Daily  . collagenase  1 application Topical QPM  . [START ON 12/26/2013] darbepoetin (ARANESP) injection - DIALYSIS  160 mcg Intravenous Q Thu-HD  . feeding supplement (NEPRO CARB STEADY)  237 mL Oral TID BM  . feeding supplement (PRO-STAT SUGAR FREE 64)  30 mL Oral Daily  . ferric gluconate (FERRLECIT/NULECIT) IV  125 mg Intravenous Q T,Th,Sa-HD  . heparin  3,700 Units Dialysis Once in dialysis  . insulin aspart  0-5 Units Subcutaneous QHS  . insulin aspart  0-9 Units Subcutaneous TID WC  . multivitamin  1 tablet Oral QHS  . pravastatin  20 mg Oral QHS  . sevelamer carbonate  800 mg Oral TID WC  . zinc sulfate  220 mg Oral Daily   Assessment: 68 y.o female with h/o ESRD on HD qTTS admitted on 11/3 after missed HD and complaint of nausea and diarrhea.  Patient has ulcer of the left knee, left hip and Dr. Isidoro Donning has ordered CT of the left knee and left hip to rule out osteomyelitis.  Pharmacy asked to dose vancomycin and zosyn for wound infection.   Goal of Therapy:  Pre HD vancomycin level 15-25 mcg/ml  Plan:  Zosyn 2.25 gm IV q8h Vancomycin 750 mg IV x1 now then 500 mg IV after each HD (qTTS) Will check vancomycin pre -HD level at steady state if vancomycin to be continued > 5 days.  Noah Delaine, RPh Clinical Pharmacist Pager: [phone] 12/25/2013,10:51 AM

## 2013-12-25 NOTE — Progress Notes (Signed)
INITIAL NUTRITION ASSESSMENT  Pt meets criteria for SEVERE MALNUTRITION in the context of chronic illness as evidenced by severe fat and muscle mass loss.  DOCUMENTATION CODES Per approved criteria  -Severe malnutrition in the context of chronic illness -Underweight   INTERVENTION: Continue Nepro Shake po TID, each supplement provides 425 kcal and 19 grams protein.  Continue 30 ml Prostat po once daily, each supplement provides 100 kcal and 15 grams of protein.   Will continue to monitor.   NUTRITION DIAGNOSIS: Malnutrition related to chronic illness as evidenced by severe fat and muscle mass loss.   Goal: Pt to meet >/= 90% of their estimated nutrition needs   Monitor:  PO intake, weight trends, labs, I/O's  Reason for Assessment: MD consult for ulcers  68 y.o. female  Admitting Dx: Hyperkalemia  ASSESSMENT: Pt with prior h/o hypertension, ESRD, on HD, CAD, stage 4 sacral decubitus, neurogenic bladder , recently treated for UTI, comes in for not feeling well, diarrhea since Thursday, missed HD on Saturday,. She also reports nausea since 5 days. On arrival to ED, she was found to have potassium of 7.1  Meal completion is 100%. Pt reports having a good appetite currently and also PTA at home with 3 full meals and Nepro Shakes 2-3 times a day. Pt reports she has been drinking her Nepro Shakes and taking her Prostat. Pt reports no recent weight loss with her usual body weight between 84-87 lbs. Pt the heaviest she has weighed in her life is 92 lbs. Will continue with current orders. Pt was encouraged to eat her food at meals and to consume her supplements.   Nutrition Focused Physical Exam:  Subcutaneous Fat:  Orbital Region: N/A Upper Arm Region: Severe depletion Thoracic and Lumbar Region: Severe depletion  Muscle:  Temple Region: Severe depletion Clavicle Bone Region: Severe depletion Clavicle and Acromion Bone Region: Severe depletion Scapular Bone Region: Severe  depletion Dorsal Hand: N/A Patellar Region: Severe depletion Anterior Thigh Region: Severe depletion Posterior Calf Region: Severe depletion  Edema: non-pitting LUE  Height: Ht Readings from Last 1 Encounters:  12/24/13 5' 3.25" (1.607 m)    Weight: Wt Readings from Last 1 Encounters:  12/24/13 84 lb 9.6 oz (38.374 kg)    Ideal Body Weight: 116 lbs  % Ideal Body Weight: 72%  Wt Readings from Last 10 Encounters:  12/24/13 84 lb 9.6 oz (38.374 kg)  10/24/13 90 lb 6.2 oz (41 kg)  10/02/13 84 lb 9.6 oz (38.374 kg)  09/16/13 83 lb 9.6 oz (37.921 kg)  08/20/13 89 lb 15.2 oz (40.8 kg)  06/12/13 85 lb (38.556 kg)  04/29/13 81 lb (36.741 kg)  04/17/13 81 lb 14.4 oz (37.15 kg)  03/06/13 88 lb 8 oz (40.143 kg)  02/05/13 85 lb (38.556 kg)    Usual Body Weight: 84-87 lbs  % Usual Body Weight: 100%  BMI:  Body mass index is 14.86 kg/(m^2). Underweight  Estimated Nutritional Needs: Kcal: 1400-1700 Protein: 60-75 grams Fluid: Per MD  Skin: stage 4 pressure ulcer on ischial, non-pitting LUE edema  Diet Order: Diet Carb Modified  EDUCATION NEEDS: -No education needs identified at this time   Intake/Output Summary (Last 24 hours) at 12/25/13 1248 Last data filed at 12/25/13 0900  Gross per 24 hour  Intake    580 ml  Output   1090 ml  Net   -510 ml    Last BM: 11/3  Labs:   Recent Labs Lab 12/24/13 1123 12/24/13 1302 12/25/13 0545  NA  138 141 134*  K 5.0 4.2 4.5  CL 102 104 97  CO2 22 25 26   BUN 44* 42* 22  CREATININE 3.41* 3.44* 1.93*  CALCIUM 9.4 9.3 8.4  PHOS  --  2.6  --   GLUCOSE 90 186* 141*    CBG (last 3)   Recent Labs  12/24/13 1740 12/24/13 2056 12/25/13 0818  GLUCAP 91 223* 157*    Scheduled Meds: . aspirin EC  81 mg Oral Daily  . collagenase  1 application Topical QPM  . [START ON 12/26/2013] darbepoetin (ARANESP) injection - DIALYSIS  160 mcg Intravenous Q Thu-HD  . enoxaparin (LOVENOX) injection  20 mg Subcutaneous Q24H  .  feeding supplement (NEPRO CARB STEADY)  237 mL Oral TID BM  . feeding supplement (PRO-STAT SUGAR FREE 64)  30 mL Oral Daily  . ferric gluconate (FERRLECIT/NULECIT) IV  125 mg Intravenous Q T,Th,Sa-HD  . heparin  3,700 Units Dialysis Once in dialysis  . insulin aspart  0-5 Units Subcutaneous QHS  . insulin aspart  0-9 Units Subcutaneous TID WC  . multivitamin  1 tablet Oral QHS  . piperacillin-tazobactam (ZOSYN)  IV  2.25 g Intravenous 3 times per day  . pravastatin  20 mg Oral QHS  . sevelamer carbonate  800 mg Oral TID WC  . [START ON 12/26/2013] vancomycin  500 mg Intravenous Q T,Th,Sa-HD  . vancomycin  750 mg Intravenous Once  . zinc sulfate  220 mg Oral Daily    Continuous Infusions:   Past Medical History  Diagnosis Date  . Hyperlipidemia   . Urinary retention     DX NEUROGENIC BLADDER--HAS INDWELLING FOLEY CATHETER  . Arthritis   . Anemia   . CAD (coronary artery disease)   . Neuropathic pain   . Shortness of breath     WITH EXERTION  . Stroke     patient not aware.  . Foley catheter in place     for urinary retention  . Critical lower limb ischemia   . Hypertension   . Peripheral vascular disease   . Hypoglycemia 08/19/2013  . Hypokalemia 08/19/2013  . Chronic kidney disease     PT TOLD RECENT TESTING SHOWS KIDNEY DAMAGE--IS SEEING NEPHROLOGIST.  OFFICE NOTES OBTAINED DR. MATTINGLY -"CHRONIC KIDNEY DISEASE 4 MOST LIKELY SECONDARY TO DIABETES"  . Diabetes mellitus without complication     TAKES INSULIN     Past Surgical History  Procedure Laterality Date  . No previous surgery    . Insertion of suprapubic catheter N/A 05/16/2012    Procedure: INSERTION OF SUPRAPUBIC CATHETER;  Surgeon: Sebastian Ache, MD;  Location: [facility] ORS;  Service: Urology;  Laterality: N/A;  . Bascilic vein transposition Left 10/30/2012    Procedure: LEFT 1ST STAGE BASCILIC VEIN TRANSPOSITION;  Surgeon: Chuck Hint, MD;  Location: [facility] OR;  Service: Vascular;  Laterality: Left;  . Nm  myocar perf wall motion  04/19/2012    low risk study-EF 44%,mild inferolateral hypkinesis  . Bascilic vein transposition Left 02/05/2013    Procedure: BASCILIC VEIN TRANSPOSITION 2ND STAGE;  Surgeon: Chuck Hint, MD;  Location: [facility] OR;  Service: Vascular;  Laterality: Left;  . Radiology with anesthesia N/A 10/25/2013    Procedure: RADIOLOGY WITH ANESTHESIA;  Surgeon: Medication Radiologist, MD;  Location: [facility] OR;  Service: Radiology;  Laterality: N/A;    Marijean Niemann, MS, RD, LDN Pager # [phone] After hours/ weekend pager # [phone]

## 2013-12-25 NOTE — Progress Notes (Signed)
Patient ID: Elizabeth [name]  female  GYB:[id number]    DOB: [birth year]    DOA: 12/24/2013  PCP: [name], MD  Brief history of present illness Patient is a 68 year old female with history of hypertension, ESRD on HD, TTS, CAD, stage IV sacral decubitus, neurogenic bladder, presented with the diarrhea, not feeling well and missed hemodialysis on Saturday, nausea.patient was found to have potassium of 7.1. Renal service consulted.  Assessment/Plan: Principal Problem:   Hyperkalemia - resolved, renal service consulted, patient underwent hemodialysis yesterday  Active Problems:   Anemia of chronic disease - Baseline hemoglobin ~ 9-10, obtain FOBT, anemia panel  Diarrhea - C. Difficile pending  Ulcer of the left knee, left hip - ordered CT of the left knee and left hip to rule out osteomyelitis, if patient has osteomyelitis, will consult orthopedics.     DM (diabetes mellitus) type 2, uncontrolled  - continue sliding scale insulin, place on Carbo modified diet    ESRD on hemodialysis - renal service following  DVT Prophylaxis:heparin subcutaneous  Code Status:full code  Family Communication:  Disposition:  Consultants:  Renal service  Procedures:  hemodialysis  Antibiotics:  IV Vanc  IV Zosyn  Subjective: Patient seen and examined, denies any specific complaints, no gross bleeding or diarrhea  Objective: Weight change:   Intake/Output Summary (Last 24 hours) at 12/25/13 1031 Last data filed at 12/25/13 0900  Gross per 24 hour  Intake    580 ml  Output   1190 ml  Net   -610 ml   Blood pressure 122/75, pulse 88, temperature 98.1 F (36.7 C), temperature source Oral, resp. rate 16, height 5' 3.25" (1.607 m), weight 38.374 kg (84 lb 9.6 oz), SpO2 100 %.  Physical Exam: General: Alert and awake, oriented x3, not in any acute distress. CVS: S1-S2 clear, no murmur rubs or gallops Chest: clear to auscultation bilaterally, no wheezing, rales or  rhonchi Abdomen: soft nontender, nondistended, normal bowel sounds  Extremities: no cyanosis, clubbing or edema noted bilaterally, stage IV sacral decub, left knee ulcer with the drainage Neuro: Cranial nerves II-XII intact, no focal neurological deficits  Lab Results: Basic Metabolic Panel:  Recent Labs Lab 12/24/13 1302 12/25/13 0545  NA 141 134*  K 4.2 4.5  CL 104 97  CO2 25 26  GLUCOSE 186* 141*  BUN 42* 22  CREATININE 3.44* 1.93*  CALCIUM 9.3 8.4  PHOS 2.6  --    Liver Function Tests:  Recent Labs Lab 12/24/13 1123 12/24/13 1302  AST 12  --   ALT 12  --   ALKPHOS 83  --   BILITOT <0.2*  --   PROT 6.5  --   ALBUMIN 1.6* 1.7*    Recent Labs Lab 12/24/13 1123  LIPASE 6*   No results for input(s): AMMONIA in the last 168 hours. CBC:  Recent Labs Lab 12/24/13 1005  12/24/13 1302 12/25/13 0545  WBC 7.5  --  9.1 8.4  NEUTROABS 5.5  --   --   --   HGB 9.9*  < > 8.8* 8.5*  8.6*  HCT 32.5*  < > 29.2* 28.0*  28.1*  MCV 79.9  --  83.0 80.5  PLT 264  --  281 196  < > = values in this interval not displayed. Cardiac Enzymes: No results for input(s): CKTOTAL, CKMB, CKMBINDEX, TROPONINI in the last 168 hours. BNP: Invalid input(s): POCBNP CBG:  Recent Labs Lab 12/24/13 1740 12/24/13 2056 12/25/13 0818  GLUCAP 91 223* 157*  Micro Results: No results found for this or any previous visit (from the past 240 hour(s)).  Studies/Results: Dg Abd Acute W/chest  12/24/2013   CLINICAL DATA:  Left lower quadrant abdominal pain. Vomiting. Weakness.  EXAM: ACUTE ABDOMEN SERIES (ABDOMEN 2 VIEW & CHEST 1 VIEW)  COMPARISON:  Two-view chest x-ray 10/18/2013.  FINDINGS: A right IJ dialysis catheter is stable in position. The heart size is normal. Mild interstitial coarsening is chronic.  Supine and decubitus views the abdomen demonstrate a nonspecific bowel gas pattern. There is no evidence for obstruction or free air. Atherosclerotic calcifications are noted in the  aorta and iliac arteries. Degenerative changes of the lumbar spine are most significant at L3-4 and L4-5.  IMPRESSION: 1. Mild interstitial prominence appears chronic. 2. No acute chest or abdominal process. 3. Atherosclerosis. 4. Right IJ dialysis catheter.   Electronically Signed   By: Gennette Pachris  Mattern M.D.   On: 12/24/2013 11:54    Medications: Scheduled Meds: . aspirin EC  81 mg Oral Daily  . collagenase  1 application Topical QPM  . [START ON 12/26/2013] darbepoetin (ARANESP) injection - DIALYSIS  160 mcg Intravenous Q Thu-HD  . feeding supplement (NEPRO CARB STEADY)  237 mL Oral TID BM  . feeding supplement (PRO-STAT SUGAR FREE 64)  30 mL Oral Daily  . ferric gluconate (FERRLECIT/NULECIT) IV  125 mg Intravenous Q T,Th,Sa-HD  . heparin  3,700 Units Dialysis Once in dialysis  . multivitamin  1 tablet Oral QHS  . pravastatin  20 mg Oral QHS  . sevelamer carbonate  800 mg Oral TID WC  . zinc sulfate  220 mg Oral Daily      LOS: 1 day   Rudolph Daoust M.D. [facility] 12/25/2013, 10:31 AM Pager: [phone]  If 7PM-7AM, please contact night-coverage [email or web address] Password [email or web address]

## 2013-12-25 NOTE — Progress Notes (Signed)
Subjective:   Feeling much better, denies abd pain and diarrhea.   Objective Filed Vitals:   12/24/13 1625 12/24/13 1736 12/24/13 2057 12/25/13 0509  BP: 117/63 147/78 126/60 131/60  Pulse: 101 117 103 95  Temp: 98 F (36.7 C) 98.4 F (36.9 C) 98.3 F (36.8 C) 98 F (36.7 C)  TempSrc: Oral Oral Oral Oral  Resp: 23 19 17 16   Height:   5' 3.25" (1.607 m)   Weight:   38.374 kg (84 lb 9.6 oz)   SpO2: 93% 96% 100% 100%   Physical Exam General: alert and oriented. No acute distress. Eating breakfast Heart: RRR Lungs: CTA, dim bases. unlabored Abdomen: soft, nontender. +BS Extremities: no edema. L knee dressing  Dialysis Access:  RIJ  cath.  L AVF +b/t  Dialysis Orders: [facility] 37 kgs 3 hr 2k/2.25ca 3900 Heparin R IJ and L AVF  hectorol 2 mcg IV/HD aranesp 160 q Thursday No Fe Profile 4  Assessment/Plan: 1. Diarrhea- cdiff pending. Denies further diarrhea 2. Hyperkalemia - resolved with HD 3. ESRD - TTS [facility]. Recently missing about half her treatments- discussed risks. K+4.5. Next HD tomorrow 4. Hypertension/volume - 131/60 - dropped on HD yesterday. No home BP meds. Small volume gains outpt, getting about 0.5L removed recently 5. Anemia - hgb 8.5, cont Aranesp Thursday- last dose 10/29. Labs from 10/29 tsat 12, ferritin 1821- start small bolus then DC 6. Metabolic bone disease - corrected Ca+10.2. pth 28 hold hectorol. Last phos 3.4- decrease renvela to 1 TID.  7. Nutrition - alb 1.6, renal diet. Multi vit. Needs supplements 8. Neurogenic bladder- with indwelling foley. Missed outpt followups with urology. Believe foley was last exchanged around 8/29.  9. DM- per primary 10. Access- just started using AVF with 1 needle 10/29- resume advancing AVF next tx 11. dispo- likely DC after HD tomorrow  Jetty Duhamel, NP [facility] Beeper [phone] 12/25/2013,9:13 AM  LOS: 1 day   Pt seen, examined, agree w assess/plan as above with additions  as indicated. Will have foley cath exchanged today. Stable from renal standpoint. Chronically debilitated , cachectic, trying to get stronger at SNF so she can got to ALF in the future.  Vinson Moselle MD pager [phone]    cell [phone] 12/25/2013, 11:15 AM     Additional Objective Labs: Basic Metabolic Panel:  Recent Labs Lab 12/24/13 1123 12/24/13 1302 12/25/13 0545  NA 138 141 134*  K 5.0 4.2 4.5  CL 102 104 97  CO2 22 25 26   GLUCOSE 90 186* 141*  BUN 44* 42* 22  CREATININE 3.41* 3.44* 1.93*  CALCIUM 9.4 9.3 8.4  PHOS  --  2.6  --    Liver Function Tests:  Recent Labs Lab 12/24/13 1123 12/24/13 1302  AST 12  --   ALT 12  --   ALKPHOS 83  --   BILITOT <0.2*  --   PROT 6.5  --   ALBUMIN 1.6* 1.7*    Recent Labs Lab 12/24/13 1123  LIPASE 6*   CBC:  Recent Labs Lab 12/24/13 1005 12/24/13 1041 12/24/13 1302 12/25/13 0545  WBC 7.5  --  9.1 8.4  NEUTROABS 5.5  --   --   --   HGB 9.9* 12.2 8.8* 8.5*  8.6*  HCT 32.5* 36.0 29.2* 28.0*  28.1*  MCV 79.9  --  83.0 80.5  PLT 264  --  281 196   Blood Culture    Component Value Date/Time   SDES URINE,  RANDOM 10/17/2013 1237   SPECREQUEST NONE 10/17/2013 1237   CULT  10/17/2013 1237    Multiple bacterial morphotypes present, none predominant. Suggest appropriate recollection if clinically indicated. Performed at [facility]   REPTSTATUS 10/18/2013 FINAL 10/17/2013 1237    Cardiac Enzymes: No results for input(s): CKTOTAL, CKMB, CKMBINDEX, TROPONINI in the last 168 hours. CBG:  Recent Labs Lab 12/24/13 1740 12/24/13 2056 12/25/13 0818  GLUCAP 91 223* 157*   Iron Studies: No results for input(s): IRON, TIBC, TRANSFERRIN, FERRITIN in the last 72 hours. @lablastinr3 @ Studies/Results: Dg Abd Acute W/chest  12/24/2013   CLINICAL DATA:  Left lower quadrant abdominal pain. Vomiting. Weakness.  EXAM: ACUTE ABDOMEN SERIES (ABDOMEN 2 VIEW & CHEST 1 VIEW)  COMPARISON:  Two-view chest x-ray  10/18/2013.  FINDINGS: A right IJ dialysis catheter is stable in position. The heart size is normal. Mild interstitial coarsening is chronic.  Supine and decubitus views the abdomen demonstrate a nonspecific bowel gas pattern. There is no evidence for obstruction or free air. Atherosclerotic calcifications are noted in the aorta and iliac arteries. Degenerative changes of the lumbar spine are most significant at L3-4 and L4-5.  IMPRESSION: 1. Mild interstitial prominence appears chronic. 2. No acute chest or abdominal process. 3. Atherosclerosis. 4. Right IJ dialysis catheter.   Electronically Signed   By: Gennette Pachris  Mattern M.D.   On: 12/24/2013 11:54   Medications:   . aspirin EC  81 mg Oral Daily  . collagenase  1 application Topical QPM  . [START ON 12/26/2013] darbepoetin (ARANESP) injection - DIALYSIS  160 mcg Intravenous Q Thu-HD  . feeding supplement (NEPRO CARB STEADY)  237 mL Oral TID BM  . feeding supplement (PRO-STAT SUGAR FREE 64)  30 mL Oral Daily  . ferric gluconate (FERRLECIT/NULECIT) IV  125 mg Intravenous Q T,Th,Sa-HD  . heparin  3,700 Units Dialysis Once in dialysis  . multivitamin  1 tablet Oral QHS  . pravastatin  20 mg Oral QHS  . sevelamer carbonate  800 mg Oral TID WC  . zinc sulfate  220 mg Oral Daily

## 2013-12-25 NOTE — Plan of Care (Signed)
Problem: Phase I Progression Outcomes Goal: Pain controlled with appropriate interventions Outcome: Not Progressing     

## 2013-12-25 NOTE — Consult Note (Addendum)
WOC wound consult note Reason for Consult: Pt familiar to [facility] nurse from previous admission, refer to progress notes on 9/2.  Pt is emaciated and immobile with multiple systemic factors which can impair healing. Left heel with pink dry scar tissue where previous pressure ulcer has healed. Left knee with stage 4 wound; 3X3X.1cm, 100% red, bone palpable, no odor small amt pink drainage. Left knee stage 4 wound; 2X.5X.2cm, 100% red, bone palpable, no odor small amt pink drainage. Left inner knee partial thickness wound .3X.3X.1cm, beefy red, small amt pink drainage. Wound type: Left hip stage 4 wound; bone palpable Pressure Ulcer POA: Yes Measurement: 4X4X2cm with undermining to wound edges to 5cm.   Wound bed: 50% loose yellow slough, 50% beefy red Drainage (amount, consistency, odor) Large amt thick tan pus draining with strong foul odor Periwound: Red and indurated, painful to touch, another pinhole opening has pus draining when pressure is applied. Dressing procedure/placement/frequency: Foam dressing to left knee wounds to protect and promote healing. Continue present plan of care with Santyl to chemically debride nonviable tissue.  Recommend X-ray or CT scan to assess for possible osteomyelitis. If this is present, then ortho consult would be necessary since this complex medical condition is beyond [facility] scope of practice.  Air mattress to decrease pressure, optimize nutrition. Conservative sharp wound debridement (CSWD performed at the bedside): Removed loose areas of slough using scissors and forceps.  Pt tolerated without c/o pain or bleeding.  Please re-consult if further assistance is needed.  Thank-you,  Cammie Mcgee MSN, RN, CWOCN, [place], CNS [phone]

## 2013-12-25 NOTE — Progress Notes (Signed)
Temp 102.4. MD notified.

## 2013-12-26 ENCOUNTER — Inpatient Hospital Stay (HOSPITAL_COMMUNITY): Payer: Medicare Other

## 2013-12-26 LAB — BASIC METABOLIC PANEL
Anion gap: 13 (ref 5–15)
BUN: 35 mg/dL — ABNORMAL HIGH (ref 6–23)
CALCIUM: 8.3 mg/dL — AB (ref 8.4–10.5)
CHLORIDE: 98 meq/L (ref 96–112)
CO2: 24 meq/L (ref 19–32)
Creatinine, Ser: 2.98 mg/dL — ABNORMAL HIGH (ref 0.50–1.10)
GFR calc Af Amer: 17 mL/min — ABNORMAL LOW (ref >90)
GFR calc non Af Amer: 15 mL/min — ABNORMAL LOW (ref >90)
GLUCOSE: 79 mg/dL (ref 70–99)
Potassium: 4.3 mEq/L (ref 3.7–5.3)
SODIUM: 135 meq/L — AB (ref 137–147)

## 2013-12-26 LAB — CBC
HCT: 25.1 % — ABNORMAL LOW (ref 36.0–46.0)
HEMOGLOBIN: 7.6 g/dL — AB (ref 12.0–15.0)
MCH: 24.4 pg — AB (ref 26.0–34.0)
MCHC: 30.3 g/dL (ref 30.0–36.0)
MCV: 80.4 fL (ref 78.0–100.0)
Platelets: 187 10*3/uL (ref 150–400)
RBC: 3.12 MIL/uL — AB (ref 3.87–5.11)
RDW: 19.4 % — ABNORMAL HIGH (ref 11.5–15.5)
WBC: 19.8 10*3/uL — ABNORMAL HIGH (ref 4.0–10.5)

## 2013-12-26 LAB — GLUCOSE, CAPILLARY
Glucose-Capillary: 76 mg/dL (ref 70–99)
Glucose-Capillary: 112 mg/dL — ABNORMAL HIGH (ref 70–99)
Glucose-Capillary: 135 mg/dL — ABNORMAL HIGH (ref 70–99)

## 2013-12-26 LAB — FERRITIN: FERRITIN: 1223 ng/mL — AB (ref 10–291)

## 2013-12-26 LAB — ABO/RH: ABO/RH(D): O POS

## 2013-12-26 LAB — HEMOGLOBIN A1C
Hgb A1c MFr Bld: 6.9 % — ABNORMAL HIGH (ref <5.7)
Mean Plasma Glucose: 151 mg/dL — ABNORMAL HIGH (ref <117)

## 2013-12-26 LAB — VITAMIN B12: VITAMIN B 12: 614 pg/mL (ref 211–911)

## 2013-12-26 LAB — PREPARE RBC (CROSSMATCH)

## 2013-12-26 MED ORDER — DARBEPOETIN ALFA 60 MCG/0.3ML IJ SOSY
PREFILLED_SYRINGE | INTRAMUSCULAR | Status: AC
Start: 2013-12-26 — End: 2013-12-26
  Filled 2013-12-26: qty 0.3

## 2013-12-26 MED ORDER — DARBEPOETIN ALFA 100 MCG/0.5ML IJ SOSY
PREFILLED_SYRINGE | INTRAMUSCULAR | Status: AC
  Filled 2013-12-26: qty 0.5

## 2013-12-26 MED ORDER — MORPHINE SULFATE 2 MG/ML IJ SOLN
INTRAMUSCULAR | Status: AC
  Filled 2013-12-26: qty 1

## 2013-12-26 MED ORDER — SODIUM CHLORIDE 0.9 % IV SOLN: Freq: Once | INTRAVENOUS | Status: DC

## 2013-12-26 NOTE — Progress Notes (Signed)
Patient ID: Elizabeth [name]  female  NFA:[id number]    DOB: 09/13/1945    DOA: 12/24/2013  PCP: Reather LittlerKUMAR,AJAY, MD  Brief history of present illness Patient is a 68 year old female with history of hypertension, ESRD on HD, TTS, CAD, stage IV sacral decubitus, neurogenic bladder, presented with the diarrhea, not feeling well and missed hemodialysis on Saturday, nausea.patient was found to have potassium of 7.1. Renal service consulted.  Assessment/Plan: Principal Problem:   Hyperkalemia - resolved, renal service consulted, undergoing hemodialysis  Active Problems:   Anemia of chronic disease - Baseline hemoglobin ~ 9-10, obtain FOBT, anemia panel consistent with anemia of chronic disease - transfuse 1 unit packed RBC with hemodialysis  Diarrhea - C. Difficile pending  Ulcer of the left knee, left hip -MRI of the left hip and left knee still pending, rule out osteo-myelitis - continue IV vancomycin and Zosyn  UTI - Follow urine culture and sensitivities, continue IV vancomycin and Zosyn  Hypotension, not on any antihypertensives: ?due to sepsis - We will continue IV antibiotics, received fluid bolus    DM (diabetes mellitus) type 2, uncontrolled  - continue sliding scale insulin, place on Carbo modified diet    ESRD on hemodialysis - renal service following  DVT Prophylaxis:heparin subcutaneous  Code Status:full code  Family Communication:  Disposition:  Consultants:  Renal service  Procedures:  hemodialysis  Antibiotics:  IV Vanc  IV Zosyn  Subjective: Patient seen and examined, hypotensive this morning at the time of my examination, denying any specific complaints, afebrile  Objective: Weight change: -0.635 kg (-1 lb 6.4 oz)  Intake/Output Summary (Last 24 hours) at 12/26/13 1227 Last data filed at 12/26/13 1158  Gross per 24 hour  Intake    755 ml  Output   -200 ml  Net    955 ml   Blood pressure 121/59, pulse 85, temperature 97.4 F (36.3 C),  temperature source Oral, resp. rate 16, height 5' 3.25" (1.607 m), weight 38.4 kg (84 lb 10.5 oz), SpO2 98 %.  Physical Exam: General: Alert and awake, oriented x3, an 80 CVS: S1-S2 clear, no murmur rubs or gallops Chest: clear to auscultation bilaterally Abdomen: soft nontender, nondistended, normal bowel sounds  Extremities: no cyanosis, clubbing or edema noted bilaterally, stage IV sacral decub, left knee ulcer with the drainage  Lab Results: Basic Metabolic Panel:  Recent Labs Lab 12/24/13 1302 12/25/13 0545 12/26/13 0758  NA 141 134* 135*  K 4.2 4.5 4.3  CL 104 97 98  CO2 25 26 24   GLUCOSE 186* 141* 79  BUN 42* 22 35*  CREATININE 3.44* 1.93* 2.98*  CALCIUM 9.3 8.4 8.3*  PHOS 2.6  --   --    Liver Function Tests:  Recent Labs Lab 12/24/13 1123 12/24/13 1302  AST 12  --   ALT 12  --   ALKPHOS 83  --   BILITOT <0.2*  --   PROT 6.5  --   ALBUMIN 1.6* 1.7*    Recent Labs Lab 12/24/13 1123  LIPASE 6*   No results for input(s): AMMONIA in the last 168 hours. CBC:  Recent Labs Lab 12/24/13 1005  12/25/13 0545 12/26/13 0758  WBC 7.5  < > 8.4 19.8*  NEUTROABS 5.5  --   --   --   HGB 9.9*  < > 8.5*  8.6* 7.6*  HCT 32.5*  < > 28.0*  28.1* 25.1*  MCV 79.9  < > 80.5 80.4  PLT 264  < > 196 187  < > =  values in this interval not displayed. Cardiac Enzymes: No results for input(s): CKTOTAL, CKMB, CKMBINDEX, TROPONINI in the last 168 hours. BNP: Invalid input(s): POCBNP CBG:  Recent Labs Lab 12/24/13 2056 12/25/13 0818 12/25/13 1232 12/25/13 1649 12/25/13 2032  GLUCAP 223* 157* 176* 162* 103*     Micro Results: No results found for this or any previous visit (from the past 240 hour(s)).  Studies/Results: Dg Abd Acute W/chest  12/24/2013   CLINICAL DATA:  Left lower quadrant abdominal pain. Vomiting. Weakness.  EXAM: ACUTE ABDOMEN SERIES (ABDOMEN 2 VIEW & CHEST 1 VIEW)  COMPARISON:  Two-view chest x-ray 10/18/2013.  FINDINGS: A right IJ dialysis  catheter is stable in position. The heart size is normal. Mild interstitial coarsening is chronic.  Supine and decubitus views the abdomen demonstrate a nonspecific bowel gas pattern. There is no evidence for obstruction or free air. Atherosclerotic calcifications are noted in the aorta and iliac arteries. Degenerative changes of the lumbar spine are most significant at L3-4 and L4-5.  IMPRESSION: 1. Mild interstitial prominence appears chronic. 2. No acute chest or abdominal process. 3. Atherosclerosis. 4. Right IJ dialysis catheter.   Electronically Signed   By: Gennette Pac M.D.   On: 12/24/2013 11:54    Medications: Scheduled Meds: . sodium chloride   Intravenous Once  . aspirin EC  81 mg Oral Daily  . collagenase  1 application Topical QPM  . darbepoetin (ARANESP) injection - DIALYSIS  160 mcg Intravenous Q Thu-HD  . feeding supplement (NEPRO CARB STEADY)  237 mL Oral TID BM  . feeding supplement (PRO-STAT SUGAR FREE 64)  30 mL Oral Daily  . ferric gluconate (FERRLECIT/NULECIT) IV  125 mg Intravenous Q T,Th,Sa-HD  . heparin  3,700 Units Dialysis Once in dialysis  . heparin subcutaneous  5,000 Units Subcutaneous Q12H  . insulin aspart  0-5 Units Subcutaneous QHS  . insulin aspart  0-9 Units Subcutaneous TID WC  . morphine      . multivitamin  1 tablet Oral QHS  . piperacillin-tazobactam (ZOSYN)  IV  2.25 g Intravenous 3 times per day  . pravastatin  20 mg Oral QHS  . sevelamer carbonate  800 mg Oral TID WC  . vancomycin  500 mg Intravenous Q T,Th,Sa-HD  . zinc sulfate  220 mg Oral Daily      LOS: 2 days   Elizabeth [name] M.D. [facility] 12/26/2013, 12:27 PM Pager: [phone]  If 7PM-7AM, please contact night-coverage [email or web address] Password [email or web address]

## 2013-12-26 NOTE — Procedures (Signed)
I was present at this dialysis session, have reviewed the session itself and made  appropriate changes.   No new complaints, diarrhea is better. BP's low today , less than 1 kg over dry wt and no vol excess on exam. Has some LUE edema and using AVF with one needle and one limb of the HD cath.  Hyperkalemia on admission has resolved. Stable from a renal standpoint, no UF with HD today.   Vinson Moselle MD (pgr) [phone]    [phone] 12/26/2013, 9:05 AM

## 2013-12-26 NOTE — Progress Notes (Signed)
Pt gone to dialysis via bed.  Dr. Arta Silence to assess patient in dialysis per Hurshel Keys dialysis RN.

## 2013-12-26 NOTE — Progress Notes (Signed)
Bp re-check 96/42, pt alert and oriented.  Pleasant, denies pain at this time.

## 2013-12-27 ENCOUNTER — Inpatient Hospital Stay (HOSPITAL_COMMUNITY): Payer: Medicare Other

## 2013-12-27 DIAGNOSIS — E43 Unspecified severe protein-calorie malnutrition: Secondary | ICD-10-CM | POA: Diagnosis present

## 2013-12-27 LAB — GLUCOSE, CAPILLARY
GLUCOSE-CAPILLARY: 104 mg/dL — AB (ref 70–99)
GLUCOSE-CAPILLARY: 172 mg/dL — AB (ref 70–99)
Glucose-Capillary: 103 mg/dL — ABNORMAL HIGH (ref 70–99)
Glucose-Capillary: 123 mg/dL — ABNORMAL HIGH (ref 70–99)

## 2013-12-27 LAB — BASIC METABOLIC PANEL
ANION GAP: 14 (ref 5–15)
BUN: 20 mg/dL (ref 6–23)
CALCIUM: 7.9 mg/dL — AB (ref 8.4–10.5)
CHLORIDE: 100 meq/L (ref 96–112)
CO2: 26 meq/L (ref 19–32)
CREATININE: 2.06 mg/dL — AB (ref 0.50–1.10)
GFR calc Af Amer: 27 mL/min — ABNORMAL LOW (ref >90)
GFR calc non Af Amer: 23 mL/min — ABNORMAL LOW (ref >90)
GLUCOSE: 130 mg/dL — AB (ref 70–99)
Potassium: 3.8 mEq/L (ref 3.7–5.3)
Sodium: 140 mEq/L (ref 137–147)

## 2013-12-27 LAB — TYPE AND SCREEN
ABO/RH(D): O POS
Antibody Screen: NEGATIVE
Unit division: 0

## 2013-12-27 LAB — CBC
HCT: 30.3 % — ABNORMAL LOW (ref 36.0–46.0)
HEMOGLOBIN: 9.5 g/dL — AB (ref 12.0–15.0)
MCH: 25.9 pg — AB (ref 26.0–34.0)
MCHC: 31.4 g/dL (ref 30.0–36.0)
MCV: 82.6 fL (ref 78.0–100.0)
Platelets: 200 10*3/uL (ref 150–400)
RBC: 3.67 MIL/uL — AB (ref 3.87–5.11)
RDW: 18.8 % — ABNORMAL HIGH (ref 11.5–15.5)
WBC: 18.3 10*3/uL — AB (ref 4.0–10.5)

## 2013-12-27 LAB — FOLATE RBC: RBC Folate: >1000 ng/mL — ABNORMAL HIGH (ref >280)

## 2013-12-27 MED ORDER — PROMETHAZINE HCL 25 MG/ML IJ SOLN: 12.5000 mg | Freq: Four times a day (QID) | INTRAMUSCULAR | Status: DC | PRN

## 2013-12-27 MED ORDER — ONDANSETRON HCL 4 MG/2ML IJ SOLN: 4.0000 mg | INTRAMUSCULAR | Status: DC | PRN

## 2013-12-27 NOTE — Progress Notes (Signed)
Spoke with infection control, stated to speak with Dr. Isidoro Donning, pt has not had bm in 2 days, may we dc contact r/o c-diff, Dr. Isidoro Donning returned call [facility] to DC contact for c-diff.

## 2013-12-27 NOTE — Progress Notes (Signed)
[facility] Progress Note  Assessment/Plan: 1. 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 Debility - 2nd major issue 3. Hyperkalemia - resolved with HD 4. ESRD - TTS [facility]. Recently missing about half her treatments.- seems to be FTT overall; perhaps needs goals of care conversation-  especially in light of multiple advanced wounds. plan next HD Saturday 5. Hypertension/volume - UF+600 yesterday BP variable - post HD weight 38.4 6. Anemia - hgb 7.6 - transfused one unit 11/05 up to 9.5 11/06 , cont Aranesp Thursday- last dose 10/29. Labs from 10/29 tsat 12, ferritin 1821- start small bolus then DC; FOBT not yet collected 7. Metabolic bone disease - corrected Ca+10.2. pth 28 hold hectorol. Last phos 2.6- decreased renvela to 1 TID - recheck Saturday 8. Severe protein calorie malnutrition - alb 1.6, . Multi vit. Needs to drink supplements; discussed with pt and RN;  changed diet to regular for improved palatability; consider adding remeron  9. Neurogenic bladder- with indwelling foley. Missed outpt followups with urology. Believe foley was last exchanged around 8/29.  10. DM- per primary 11. Access- just started using AVF with 1 needle 10/29- resume advancing AVF next tx; she tells me they used 1:1 yesterday - Qn 400 recorded 12. Neurogenic bladder - with chronic foley Urine culture >100K GNR; on Vanc and Zosyn 13. Leukocytosis - marked increase started 11/05; tmax 98.9, MRIs of knee and hip ordered; CXR/abd film ok on adm when WBC was normal   Sheffield Slider, PA-C [facility] Beeper [phone] 12/27/2013,8:59 AM  LOS: 3 days   Pt seen, examined and agree w A/P as above. Vinson Moselle MD pager [phone]    cell [phone] 12/27/2013, 10:31 AM    Subjective:   Ate only a couple bites of breakfast - didn't drink any Nepro yesterday   Objective Filed Vitals:   12/26/13 1158 12/26/13 1816 12/26/13 2106  12/27/13 0454  BP: 121/59 124/64 132/58 110/56  Pulse: 85 89 85 99  Temp: 97.4 F (36.3 C) 98 F (36.7 C) 97.6 F (36.4 C) 98.9 F (37.2 C)  TempSrc: Oral Oral Oral Oral  Resp: 16 16 16 16   Height:   5' 3.25" (1.607 m)   Weight: 38.4 kg (84 lb 10.5 oz)  38.238 kg (84 lb 4.8 oz)   SpO2: 98% 98% 92% 99%   Physical Exam General: alert, sitting in bed, emaciated NAD Heart: RRR Lungs: no rales Abdomen: soft NT  BS Extremities: no LE edema left knee dressing Dialysis Access: right IJ and left upper AVF + bruit  Dialysis Orders: [facility] 37 kgs 3 hr 2k/2.25ca 3900 Heparin R IJ and L AVF  hectorol 2 mcg IV/HD aranesp 160 q Thursday No Fe Profile 4  Additional Objective Labs: Basic Metabolic Panel:  Recent Labs Lab 12/24/13 1302 12/25/13 0545 12/26/13 0758 12/27/13 0455  NA 141 134* 135* 140  K 4.2 4.5 4.3 3.8  CL 104 97 98 100  CO2 25 26 24 26   GLUCOSE 186* 141* 79 130*  BUN 42* 22 35* 20  CREATININE 3.44* 1.93* 2.98* 2.06*  CALCIUM 9.3 8.4 8.3* 7.9*  PHOS 2.6  --   --   --    Liver Function Tests:  Recent Labs Lab 12/24/13 1123 12/24/13 1302  AST 12  --   ALT 12  --   ALKPHOS 83  --   BILITOT <0.2*  --   PROT 6.5  --  ALBUMIN 1.6* 1.7*    Recent Labs Lab 12/24/13 1123  LIPASE 6*   CBC:  Recent Labs Lab 12/24/13 1005  12/24/13 1302 12/25/13 0545 12/26/13 0758 12/27/13 0455  WBC 7.5  --  9.1 8.4 19.8* 18.3*  NEUTROABS 5.5  --   --   --   --   --   HGB 9.9*  < > 8.8* 8.5*  8.6* 7.6* 9.5*  HCT 32.5*  < > 29.2* 28.0*  28.1* 25.1* 30.3*  MCV 79.9  --  83.0 80.5 80.4 82.6  PLT 264  --  281 196 187 200  < > = values in this interval not displayed. Blood Culture    Component Value Date/Time   SDES BLOOD LEFT ARM 12/25/2013 2225   SPECREQUEST BOTTLES DRAWN AEROBIC ONLY 10CC 12/25/2013 2225   CULT  12/25/2013 2225           BLOOD CULTURE RECEIVED NO GROWTH TO DATE CULTURE WILL BE HELD FOR 5 DAYS BEFORE ISSUING A FINAL NEGATIVE  REPORT Performed at [facility]    REPTSTATUS PENDING 12/25/2013 2225   CBG:  Recent Labs Lab 12/25/13 2032 12/26/13 1247 12/26/13 1609 12/26/13 2103 12/27/13 0842  GLUCAP 103* 76 112* 135* 104*   Iron Studies:  Recent Labs  12/25/13 0545  IRON 34*  TIBC 120*  FERRITIN 1223*    No results found. Medications:   . sodium chloride   Intravenous Once  . aspirin EC  81 mg Oral Daily  . collagenase  1 application Topical QPM  . darbepoetin (ARANESP) injection - DIALYSIS  160 mcg Intravenous Q Thu-HD  . feeding supplement (NEPRO CARB STEADY)  237 mL Oral TID BM  . feeding supplement (PRO-STAT SUGAR FREE 64)  30 mL Oral Daily  . ferric gluconate (FERRLECIT/NULECIT) IV  125 mg Intravenous Q T,Th,Sa-HD  . heparin  3,700 Units Dialysis Once in dialysis  . heparin subcutaneous  5,000 Units Subcutaneous Q12H  . insulin aspart  0-5 Units Subcutaneous QHS  . insulin aspart  0-9 Units Subcutaneous TID WC  . multivitamin  1 tablet Oral QHS  . piperacillin-tazobactam (ZOSYN)  IV  2.25 g Intravenous 3 times per day  . pravastatin  20 mg Oral QHS  . sevelamer carbonate  800 mg Oral TID WC  . vancomycin  500 mg Intravenous Q T,Th,Sa-HD  . zinc sulfate  220 mg Oral Daily

## 2013-12-27 NOTE — Progress Notes (Signed)
Patient ID: Elizabeth [name]  female  ZOX:[id number]    DOB: 04-10-1945    DOA: 12/24/2013  PCP: [name], MD  Brief history of present illness Patient is a 68 year old female with history of hypertension, ESRD on HD, TTS, CAD, stage IV sacral decubitus, neurogenic bladder, presented with the diarrhea, not feeling well and missed hemodialysis on Saturday, nausea.patient was found to have potassium of 7.1. Renal service consulted.  Assessment/Plan: Principal Problem:   Hyperkalemia - resolved, renal service consulted, undergoing hemodialysis  Active Problems: Decub Ulcer of the left knee, left hip/sepsis, leukocytosis - MRI of the left hip and left knee still pending, rule out osteo-myelitis - continue IV vancomycin and Zosyn - follow blood cultures  Gram-negative rods UTI, neurogenic bladder - Follow urine culture and sensitivities, continue IV vancomycin and Zosyn    Anemia of chronic disease - Baseline hemoglobin ~ 9-10, FOBTpending, anemia panel consistent with anemia of chronic disease - transfused 1 unit packed RBC with hemodialysis 11/5    DM (diabetes mellitus) type 2, uncontrolled  - continue sliding scale insulin, place on Carb modified diet    ESRD on hemodialysis - renal service following  DVT Prophylaxis: heparin subcutaneous  Code Status: full code  Family Communication: d/w Daughter Ms April Anderson [phone] on phone, she confirmed that patient has been doing poorly, not eating enough at the facility. I discussed with daughter regarding code status and artificial feeding. She was not able to make any decisions, wants to talk to the patient to understand her wishes. Placed Palliative care consult for GOC.   Disposition:  Consultants:  Renal service  Procedures:  hemodialysis  Antibiotics:  IV Vanc  IV Zosyn  Subjective: Patient seen and examined, feels nauseous, no other complaints   Objective: Weight change: 0.561 kg (1 lb 3.8  oz)  Intake/Output Summary (Last 24 hours) at 12/27/13 1051 Last data filed at 12/27/13 0900  Gross per 24 hour  Intake    835 ml  Output   -600 ml  Net   1435 ml   Blood pressure 109/65, pulse 100, temperature 98.4 F (36.9 C), temperature source Oral, resp. rate 16, height 5' 3.25" (1.607 m), weight 38.238 kg (84 lb 4.8 oz), SpO2 95 %.  Physical Exam: General: Alert and awake, oriented x3, NAD CVS: S1-S2 clear Chest: CTAB Abdomen: soft nontender, nondistended, normal bowel sounds  Extremities: no cyanosis, clubbing or edema noted bilaterally, stage IV sacral decub, left knee ulcer with the drainage  Lab Results: Basic Metabolic Panel:  Recent Labs Lab 12/24/13 1302  12/26/13 0758 12/27/13 0455  NA 141  < > 135* 140  K 4.2  < > 4.3 3.8  CL 104  < > 98 100  CO2 25  < > 24 26  GLUCOSE 186*  < > 79 130*  BUN 42*  < > 35* 20  CREATININE 3.44*  < > 2.98* 2.06*  CALCIUM 9.3  < > 8.3* 7.9*  PHOS 2.6  --   --   --   < > = values in this interval not displayed. Liver Function Tests:  Recent Labs Lab 12/24/13 1123 12/24/13 1302  AST 12  --   ALT 12  --   ALKPHOS 83  --   BILITOT <0.2*  --   PROT 6.5  --   ALBUMIN 1.6* 1.7*    Recent Labs Lab 12/24/13 1123  LIPASE 6*   No results for input(s): AMMONIA in the last 168 hours. CBC:  Recent Labs Lab  12/24/13 1005  12/26/13 0758 12/27/13 0455  WBC 7.5  < > 19.8* 18.3*  NEUTROABS 5.5  --   --   --   HGB 9.9*  < > 7.6* 9.5*  HCT 32.5*  < > 25.1* 30.3*  MCV 79.9  < > 80.4 82.6  PLT 264  < > 187 200  < > = values in this interval not displayed. Cardiac Enzymes: No results for input(s): CKTOTAL, CKMB, CKMBINDEX, TROPONINI in the last 168 hours. BNP: Invalid input(s): POCBNP CBG:  Recent Labs Lab 12/25/13 2032 12/26/13 1247 12/26/13 1609 12/26/13 2103 12/27/13 0842  GLUCAP 103* 76 112* 135* 104*     Micro Results: Recent Results (from the past 240 hour(s))  Urine culture     Status: None  (Preliminary result)   Collection Time: 12/25/13  5:37 PM  Result Value Ref Range Status   Specimen Description URINE, CATHETERIZED  Final   Special Requests NONE  Final   Culture  Setup Time   Final    12/25/2013 18:57 Performed at [facility]    Colony Count   Final    >=100,000 COLONIES/ML Performed at [facility]    Culture   Final    GRAM NEGATIVE RODS Performed at [facility]    Report Status PENDING  Incomplete  Culture, blood (routine x 2)     Status: None (Preliminary result)   Collection Time: 12/25/13 10:21 PM  Result Value Ref Range Status   Specimen Description BLOOD LEFT ARM  Final   Special Requests BOTTLES DRAWN AEROBIC AND ANAEROBIC 10CC  Final   Culture  Setup Time   Final    12/26/2013 03:47 Performed at [facility]    Culture   Final           BLOOD CULTURE RECEIVED NO GROWTH TO DATE CULTURE WILL BE HELD FOR 5 DAYS BEFORE ISSUING A FINAL NEGATIVE REPORT Performed at [facility]    Report Status PENDING  Incomplete  Culture, blood (routine x 2)     Status: None (Preliminary result)   Collection Time: 12/25/13 10:25 PM  Result Value Ref Range Status   Specimen Description BLOOD LEFT ARM  Final   Special Requests BOTTLES DRAWN AEROBIC ONLY 10CC  Final   Culture  Setup Time   Final    12/26/2013 03:46 Performed at [facility]    Culture   Final           BLOOD CULTURE RECEIVED NO GROWTH TO DATE CULTURE WILL BE HELD FOR 5 DAYS BEFORE ISSUING A FINAL NEGATIVE REPORT Performed at [facility]    Report Status PENDING  Incomplete    Studies/Results: Dg Abd Acute W/chest  12/24/2013   CLINICAL DATA:  Left lower quadrant abdominal pain. Vomiting. Weakness.  EXAM: ACUTE ABDOMEN SERIES (ABDOMEN 2 VIEW & CHEST 1 VIEW)  COMPARISON:  Two-view chest x-ray 10/18/2013.  FINDINGS: A right IJ dialysis catheter is stable in position. The heart size is normal. Mild interstitial coarsening is chronic.  Supine  and decubitus views the abdomen demonstrate a nonspecific bowel gas pattern. There is no evidence for obstruction or free air. Atherosclerotic calcifications are noted in the aorta and iliac arteries. Degenerative changes of the lumbar spine are most significant at L3-4 and L4-5.  IMPRESSION: 1. Mild interstitial prominence appears chronic. 2. No acute chest or abdominal process. 3. Atherosclerosis. 4. Right IJ dialysis catheter.   Electronically Signed   By: Gennette Pachris  Mattern  M.D.   On: 12/24/2013 11:54    Medications: Scheduled Meds: . sodium chloride   Intravenous Once  . aspirin EC  81 mg Oral Daily  . collagenase  1 application Topical QPM  . darbepoetin (ARANESP) injection - DIALYSIS  160 mcg Intravenous Q Thu-HD  . feeding supplement (NEPRO CARB STEADY)  237 mL Oral TID BM  . feeding supplement (PRO-STAT SUGAR FREE 64)  30 mL Oral Daily  . ferric gluconate (FERRLECIT/NULECIT) IV  125 mg Intravenous Q T,Th,Sa-HD  . heparin  3,700 Units Dialysis Once in dialysis  . heparin subcutaneous  5,000 Units Subcutaneous Q12H  . insulin aspart  0-5 Units Subcutaneous QHS  . insulin aspart  0-9 Units Subcutaneous TID WC  . multivitamin  1 tablet Oral QHS  . piperacillin-tazobactam (ZOSYN)  IV  2.25 g Intravenous 3 times per day  . pravastatin  20 mg Oral QHS  . sevelamer carbonate  800 mg Oral TID WC  . vancomycin  500 mg Intravenous Q T,Th,Sa-HD  . zinc sulfate  220 mg Oral Daily      LOS: 3 days   RAI,RIPUDEEP M.D. [facility] 12/27/2013, 10:51 AM Pager: [phone]  If 7PM-7AM, please contact night-coverage [email or web address] Password [email or web address]

## 2013-12-27 NOTE — Evaluation (Signed)
Physical Therapy Evaluation Patient Details Name: Elizabeth [name] MRN: [id number] DOB: 05/14/1945 Today's Date: 12/27/2013   History of Present Illness  68 y.o. female [facility] with nausea, diarrhea, and hyperkalemia after missing dialysis.  Pt with significant PMHx of anemia, CAD, neuropathic pain, SOB, stroke, urinary retention with foley catheter in place, HTN, PVD, CKD, DM, sacral ulcer, and h/o falls.   Clinical Impression  Pt is able to mobilize with RW short in-room distances with min assist.  She is very weak and deconditioned and she has visible muscle atrophy.  She only weighs 84 lbs.  She is appropriate for SNF level rehab at discharge.   PT to follow acutely for deficits listed below.       Follow Up Recommendations SNF    Equipment Recommendations  None recommended by PT    Recommendations for Other Services   NA   Precautions / Restrictions Precautions Precautions: Fall Precaution Comments: per pt report she has had a recent fall where she had to crawl to get help (bil knees with abrasions).       Mobility  Bed Mobility Overal bed mobility: Needs Assistance Bed Mobility: Supine to Sit     Supine to sit: Min guard     General bed mobility comments: Min guard assist to help support trunk during transitions to sitting.   Transfers Overall transfer level: Needs assistance Equipment used: Rolling walker (2 wheeled) Transfers: Sit to/from Stand Sit to Stand: Min assist         General transfer comment: Min assist to support trunk during transition to stand.  Pt in standing is reporting cramping in her left lower leg.  Pt with tendancy towards posterior LOB.   Ambulation/Gait Ambulation/Gait assistance: Min assist Ambulation Distance (Feet): 20 Feet Assistive device: Rolling walker (2 wheeled) Gait Pattern/deviations: Step-through pattern;Staggering left;Staggering right Gait velocity: decreased   General Gait Details: Min assist to support  trunk for balance during gait and help to steer RW.           Balance Overall balance assessment: Needs assistance Sitting-balance support: Feet supported;No upper extremity supported Sitting balance-Leahy Scale: Good   Postural control: Posterior lean Standing balance support: Bilateral upper extremity supported;Single extremity supported;No upper extremity supported Standing balance-Leahy Scale: Fair                               Pertinent Vitals/Pain Pain Assessment: No/denies pain    Home Living Family/patient expects to be discharged to:: Skilled nursing facility                      Prior Function Level of Independence: Independent with assistive device(s)         Comments: uses a RW and a WC for mobility at SNF     Hand Dominance   Dominant Hand: Right    Extremity/Trunk Assessment   Upper Extremity Assessment: Generalized weakness           Lower Extremity Assessment: Generalized weakness      Cervical / Trunk Assessment: Normal  Communication   Communication: No difficulties  Cognition Arousal/Alertness: Awake/alert Behavior During Therapy: WFL for tasks assessed/performed Overall Cognitive Status: Within Functional Limits for tasks assessed                               Assessment/Plan    PT Assessment  Patient needs continued PT services  PT Diagnosis Difficulty walking;Abnormality of gait;Generalized weakness   PT Problem List Decreased strength;Decreased activity tolerance;Decreased balance;Decreased mobility;Decreased knowledge of use of DME;Pain  PT Treatment Interventions DME instruction;Gait training;Functional mobility training;Therapeutic activities;Therapeutic exercise;Balance training;Neuromuscular re-education;Patient/family education;Modalities   PT Goals (Current goals can be found in the Care Plan section) Acute Rehab PT Goals Patient Stated Goal: to go back to "rest home" at discharge PT Goal  Formulation: With patient Time For Goal Achievement: 01/10/14 Potential to Achieve Goals: Good    Frequency Min 2X/week    End of Session   Activity Tolerance: Patient limited by fatigue Patient left: in chair;with call bell/phone within reach (chair with pillows to pad her sacral wound)           Time: 3358-2518 PT Time Calculation (min): 16 min   Charges:   PT Evaluation $Initial PT Evaluation Tier I: 1 Procedure          Macedonio Scallon B. Janson Lamar, PT, DPT [phone]   12/27/2013, 4:49 PM

## 2013-12-27 NOTE — Care Management Note (Signed)
CARE MANAGEMENT NOTE 12/27/2013  Patient:  Elizabeth [name],Elizabeth [name]   Account Number:  [id number]  Date Initiated:  12/27/2013  Documentation initiated by:  Joram Venson  Subjective/Objective Assessment:   CM following for progression and d/c planning.     Action/Plan:   Pt is resident of [facility], plan is to return to that facility.   Anticipated DC Date:  12/29/2013   Anticipated DC Plan:  SKILLED NURSING FACILITY         Choice offered to / List presented to:             Status of service:  Completed, signed off Medicare Important Message given?  YES (If response is "NO", the following Medicare IM given date fields will be blank) Date Medicare IM given:  12/27/2013 Medicare IM given by:   Date Additional Medicare IM given:   Additional Medicare IM given by:    Discharge Disposition:    Per UR Regulation:    If discussed at Long Length of Stay Meetings, dates discussed:    Comments:

## 2013-12-28 LAB — CBC
HCT: 30.8 % — ABNORMAL LOW (ref 36.0–46.0)
Hemoglobin: 9.4 g/dL — ABNORMAL LOW (ref 12.0–15.0)
MCH: 25.6 pg — ABNORMAL LOW (ref 26.0–34.0)
MCHC: 30.5 g/dL (ref 30.0–36.0)
MCV: 83.9 fL (ref 78.0–100.0)
PLATELETS: 233 10*3/uL (ref 150–400)
RBC: 3.67 MIL/uL — ABNORMAL LOW (ref 3.87–5.11)
RDW: 19.3 % — AB (ref 11.5–15.5)
WBC: 13.6 10*3/uL — AB (ref 4.0–10.5)

## 2013-12-28 LAB — BASIC METABOLIC PANEL
ANION GAP: 14 (ref 5–15)
BUN: 30 mg/dL — ABNORMAL HIGH (ref 6–23)
CO2: 25 mEq/L (ref 19–32)
CREATININE: 3.33 mg/dL — AB (ref 0.50–1.10)
Calcium: 8.3 mg/dL — ABNORMAL LOW (ref 8.4–10.5)
Chloride: 97 mEq/L (ref 96–112)
GFR calc non Af Amer: 13 mL/min — ABNORMAL LOW (ref >90)
GFR, EST AFRICAN AMERICAN: 15 mL/min — AB (ref >90)
Glucose, Bld: 80 mg/dL (ref 70–99)
POTASSIUM: 3.7 meq/L (ref 3.7–5.3)
Sodium: 136 mEq/L — ABNORMAL LOW (ref 137–147)

## 2013-12-28 LAB — URINE CULTURE

## 2013-12-28 LAB — GLUCOSE, CAPILLARY: GLUCOSE-CAPILLARY: 81 mg/dL (ref 70–99)

## 2013-12-28 MED ORDER — PROMETHAZINE HCL 12.5 MG PO TABS: 12.5000 mg | ORAL_TABLET | Freq: Four times a day (QID) | ORAL | Status: DC | PRN

## 2013-12-28 MED ORDER — NEPRO/CARBSTEADY PO LIQD: 237.0000 mL | Freq: Three times a day (TID) | ORAL | Status: DC

## 2013-12-28 MED ORDER — OXYCODONE-ACETAMINOPHEN 5-325 MG PO TABS: 1.0000 | ORAL_TABLET | Freq: Three times a day (TID) | ORAL | Status: DC | PRN

## 2013-12-28 MED ORDER — DOXYCYCLINE HYCLATE 100 MG PO CAPS: 100.0000 mg | ORAL_CAPSULE | Freq: Two times a day (BID) | ORAL | Status: DC

## 2013-12-28 MED ORDER — CIPROFLOXACIN HCL 500 MG PO TABS: 500.0000 mg | ORAL_TABLET | Freq: Every day | ORAL | Status: DC

## 2013-12-28 MED ORDER — SEVELAMER CARBONATE 800 MG PO TABS: 800.0000 mg | ORAL_TABLET | Freq: Three times a day (TID) | ORAL | Status: DC

## 2013-12-28 NOTE — Discharge Summary (Signed)
Physician Discharge Summary  Patient ID: Elizabeth [name] MRN: [id number] DOB/AGE: Apr 15, 1945 68 y.o.  Admit date: 12/24/2013 Discharge date: 12/28/2013  Primary Care Physician:  [name], MD  Discharge Diagnoses:    . Acute hyperkalemia . decubitus ulcer left hip . Escherichia coli UTI (urinary tract infection) . Anemia of chronic disease . Diarrhea . DM (diabetes mellitus) type 2, uncontrolled, with ketoacidosis . Ulcer of left knee . UTI (urinary tract infection) . Protein-calorie malnutrition, severe  Consults:  Nephrology, Dr. Arlean Hopping   Recommendations for Outpatient Follow-up:  Please make a follow-up appointment in the wound care center In 7-10 days  Wound care: Apply to left hip on damp gauze Q day and pack into left hip wound using swab to fill.  Cover with foam dressing.  (Change foam dressing Q 5 days or PRN soiling.)  Foam dressing to left knee area.   Allergies:  No Known Allergies   Discharge Medications:   Medication List    STOP taking these medications        cephALEXin 250 MG capsule  Commonly known as:  KEFLEX     insulin glargine 100 UNIT/ML injection  Commonly known as:  LANTUS      TAKE these medications        aspirin EC 81 MG tablet  Take 81 mg by mouth daily.     ciprofloxacin 500 MG tablet  Commonly known as:  CIPRO  Take 1 tablet (500 mg total) by mouth daily. For 2 weeks     collagenase ointment  Commonly known as:  SANTYL  Apply 1 application topically every evening. Apply to sacrum every evening. Clean ulcer with wound cleanser apply antyl and cover with dry dressing     doxycycline 100 MG capsule  Commonly known as:  VIBRAMYCIN  Take 1 capsule (100 mg total) by mouth 2 (two) times daily. X 2 weeks     feeding supplement (NEPRO CARB STEADY) Liqd  Take 237 mLs by mouth as needed (missed meal during dialysis.).     feeding supplement (NEPRO CARB STEADY) Liqd  Take 237 mLs by mouth 3 (three) times daily between meals.      feeding supplement (PRO-STAT SUGAR FREE 64) Liqd  Take 30 mLs by mouth daily.     glucose blood test strip  Commonly known as:  ONETOUCH VERIO  Use as instructed to check blood sugar 4 times per day dx code 250.00     insulin aspart 100 UNIT/ML injection  Commonly known as:  novoLOG  Inject 0-12 Units into the skin 2 (two) times daily. Sliding scale: 0-149=0; 150-199=2; 200-249=4; 250-299=6; 300-349=8; 350-399=10; 400-499=12; 450> = Call MD     multivitamin Tabs tablet  Take 1 tablet by mouth daily.     ONETOUCH DELICA LANCETS FINE Misc  Use to check blood sugar 4 times per day     oxyCODONE-acetaminophen 5-325 MG per tablet  Commonly known as:  PERCOCET/ROXICET  Take 1-2 tablets by mouth every 8 (eight) hours as needed for moderate pain or severe pain.     pravastatin 20 MG tablet  Commonly known as:  PRAVACHOL  Take 20 mg by mouth at bedtime.     promethazine 12.5 MG tablet  Commonly known as:  PHENERGAN  Take 1 tablet (12.5 mg total) by mouth every 6 (six) hours as needed for nausea or vomiting.     sevelamer carbonate 800 MG tablet  Commonly known as:  RENVELA  Take 1 tablet (800 mg total) by mouth  3 (three) times daily with meals.     VITAMIN C PO  Take 150 mg by mouth 2 (two) times daily.     zinc sulfate 220 MG capsule  Take 220 mg by mouth daily.         Brief H and P: For complete details please refer to admission H and P, but in brief patient is a 68 year old female with prior history of hypertension, ESRD on HD, CAD, stage IV sacral decubitus, neurogenic bladder was recently treated for UTI presented to ER for not feeling well, diarrhea, missed hemodialysis. She also reported nausea for last 5 days prior to admission. On arrival to ED, she was found to have potassium of 7.1 and was referred to medical service for admission.  Hospital Course:  Hyperkalemia Resolved, nephrology service was consulted patient underwent urgent hemodialysis. Potassium is 3.7 on  the day of discharge.  Decubitus ulcer of the left hip/sacrum, left knee MRI of the left hip and left knee were obtained which showed no osteomyelitis or any abscess. Patient was placed on IV vancomycin and Zosyn during hospitalization. Blood cultures remained negative. She needs aggressive wound care to both areas. Patient will continue oral doxycycline and ciprofloxacin for 2 weeks. She should have a follow-up appointment in wound care center in 7-10 days.  Escherichia coli UTI with history of neurogenic bladder Patient has received IV Zosyn during hospitalization, will continue ciprofloxacin. Needs periodic Foley changed.  Anemia of chronic disease Baseline hemoglobin 9-10, anemia panel consistent with anemia of chronic disease. The patient was transfused 1 unit packed RBCs with hemodialysis on 11/5  Diabetes mellitus continue sliding scale insulin and carb modified diet     Day of Discharge BP 129/65 mmHg  Pulse 91  Temp(Src) 97.6 F (36.4 C) (Oral)  Resp 16  Ht 5' 3.25" (1.607 m)  Wt 38.7 kg (85 lb 5.1 oz)  BMI 14.99 kg/m2  SpO2 96%  Physical Exam: General: Alert and awake oriented x3 not in any acute distress. CVS: S1-S2 clear no murmur rubs or gallops Chest: clear to auscultation bilaterally, no wheezing rales or rhonchi Abdomen: soft nontender, nondistended, normal bowel sounds Extremities: no cyanosis, clubbing or edema noted bilaterally, stage IV sacral decub, left knee ulcer Neuro: Cranial nerves II-XII intact, no focal neurological deficits   The results of significant diagnostics from this hospitalization (including imaging, microbiology, ancillary and laboratory) are listed below for reference.    LAB RESULTS: Basic Metabolic Panel:  Recent Labs Lab 12/24/13 1302  12/27/13 0455 12/28/13 0527  NA 141  < > 140 136*  K 4.2  < > 3.8 3.7  CL 104  < > 100 97  CO2 25  < > 26 25  GLUCOSE 186*  < > 130* 80  BUN 42*  < > 20 30*  CREATININE 3.44*  < > 2.06*  3.33*  CALCIUM 9.3  < > 7.9* 8.3*  PHOS 2.6  --   --   --   < > = values in this interval not displayed. Liver Function Tests:  Recent Labs Lab 12/24/13 1123 12/24/13 1302  AST 12  --   ALT 12  --   ALKPHOS 83  --   BILITOT <0.2*  --   PROT 6.5  --   ALBUMIN 1.6* 1.7*    Recent Labs Lab 12/24/13 1123  LIPASE 6*   No results for input(s): AMMONIA in the last 168 hours. CBC:  Recent Labs Lab 12/24/13 1005  12/27/13 0455 12/28/13 1610  WBC 7.5  < > 18.3* 13.6*  NEUTROABS 5.5  --   --   --   HGB 9.9*  < > 9.5* 9.4*  HCT 32.5*  < > 30.3* 30.8*  MCV 79.9  < > 82.6 83.9  PLT 264  < > 200 233  < > = values in this interval not displayed. Cardiac Enzymes: No results for input(s): CKTOTAL, CKMB, CKMBINDEX, TROPONINI in the last 168 hours. BNP: Invalid input(s): POCBNP CBG:  Recent Labs Lab 12/27/13 2043 12/28/13 0725  GLUCAP 123* 81    Significant Diagnostic Studies:  Dg Abd Acute W/chest  12/24/2013   CLINICAL DATA:  Left lower quadrant abdominal pain. Vomiting. Weakness.  EXAM: ACUTE ABDOMEN SERIES (ABDOMEN 2 VIEW & CHEST 1 VIEW)  COMPARISON:  Two-view chest x-ray 10/18/2013.  FINDINGS: A right IJ dialysis catheter is stable in position. The heart size is normal. Mild interstitial coarsening is chronic.  Supine and decubitus views the abdomen demonstrate a nonspecific bowel gas pattern. There is no evidence for obstruction or free air. Atherosclerotic calcifications are noted in the aorta and iliac arteries. Degenerative changes of the lumbar spine are most significant at L3-4 and L4-5.  IMPRESSION: 1. Mild interstitial prominence appears chronic. 2. No acute chest or abdominal process. 3. Atherosclerosis. 4. Right IJ dialysis catheter.   Electronically Signed   By: Gennette Pachris  Mattern M.D.   On: 12/24/2013 11:54       Disposition and Follow-up: Discharge Instructions    Diet Carb Modified    Complete by:  As directed      Discharge instructions    Complete by:  As  directed   Wound care: Apply to left hip on damp gauze Q day and pack into left hip wound using swab to fill.  Cover with foam dressing.  (Change foam dressing Q 5 days or PRN soiling.) Foam dressing to left knee area.     Increase activity slowly    Complete by:  As directed             DISPOSITION: skilled nursing facility  DIET: Carb modified diet   DISCHARGE FOLLOW-UP Follow-up Information    [facility] Schedule an appointment as soon as possible for a visit in 2 weeks.   Specialty:  Endocrinology   Why:  for hospital follow-up   Contact information:   [address] [place] [address] [phone]       Time spent on Discharge: 35 mins  Signed:   RAI,RIPUDEEP M.D. [facility] 12/28/2013, 10:24 AM Pager: [phone]

## 2013-12-28 NOTE — Progress Notes (Signed)
Report called to Joyce Gross, RN at [facility].  All questions answered.

## 2013-12-28 NOTE — Progress Notes (Addendum)
Infectious Disease: Vanc + Zosyn for empiric coverage of stage 4 sacral decubitus ulcer, L-knee/hip ulcer, and UTI - pending MRI to r/o osteo (not yet done). Also with diarrhea - CDiff ordered, not yet sent - improved. ESRD-TTS. Afebrile, WBC 13.6 << 18.3 << 19.8.   Vanc 11/4 >> * Vanc doses: 750 mg on 11/4 @ 1720, 500 mg on 11/5 @ 1048 * HD sessions: 11/5 (BFR 200 x 1.5hr, 400 x 2hr) Zosyn 11/4 >>  11/4 BCx >> ngtd 11/4 UCx >> GNR CDiff ordered  Plan - Cont Zosyn 2.25g/8h and Vanc 500 mg/HD-TTS for now, but patient will be discharged on doxy and cipro today

## 2013-12-28 NOTE — Procedures (Signed)
I was present at this dialysis session, have reviewed the session itself and made  appropriate changes  MRI's showed no evidence of osteomyelitis related to pressure sore areas. Plan is for DC today after HD back to SNF.  No new suggestions.   Vinson Moselle MD (pgr) [phone]    [phone] 12/28/2013, 12:20 PM

## 2013-12-28 NOTE — Clinical Social Work Psychosocial (Signed)
Clinical Social Work Department BRIEF PSYCHOSOCIAL ASSESSMENT 12/28/2013  Patient:  Ciszek,Sable     Account Number:  [id number]     Admit date:  12/24/2013  Clinical Social Worker:  Earnestine Leys  Date/Time:  12/28/2013 01:44 PM  Referred by:  Physician  Date Referred:  12/28/2013 Referred for  SNF Placement   Other Referral:   Interview type:  Family Other interview type:   April Anderson (daughter)    PSYCHOSOCIAL DATA Living Status:  FACILITY Admitted from facility:  [facility] Level of care:  Skilled Nursing Facility Primary support name:  April Anderson Primary support relationship to patient:  CHILD, ADULT Degree of support available:   Good    CURRENT CONCERNS Current Concerns  Post-Acute Placement   Other Concerns:    SOCIAL WORK ASSESSMENT / PLAN CSW contacted patient's daughter (April) as patient was in dialysis and unable to complete assessment at time. Per April, patient has been at [facility] since September 4. April reports both she and patient agreeable to return to facility. April denied patient using equipment to ambulate, but reports patient is to have a wheelchair.   Assessment/plan status:  Other - See comment Other assessment/ plan:   CSW will update FL2 for return to facility.   Information/referral to community resources:   .    PATIENT'S/FAMILY'S RESPONSE TO PLAN OF CARE: Patient's daughter was pleasant and cooperative.   Sahmya Arai Patrick-Jefferson, LCSWA Weekend Clinical Social Worker [phone]

## 2013-12-28 NOTE — Clinical Social Work Note (Signed)
CSW made aware patient ready for d/c to [facility] by RN, Lurena Joiner. CSW contacted facility and spoke with Alvino Chapel who confirmed bed availability. CSW contacted patient's daughter April, as patient was asleep in dialysis. Daughter states she as well as patient is agreeable to return to [facility]. CSW faxed d/c summary to facility and prepared d/c packet. CSW provided RN with number for room and report. CSW to arrange transportation via [place]. No further needs. CSW signing off.  Shakiera Edelson Patrick-[place], LCSWA Weekend Clinical Social Worker [phone]

## 2013-12-28 NOTE — Plan of Care (Signed)
Problem: Phase I Progression Outcomes Goal: Pain controlled with appropriate interventions Outcome: Completed/Met Date Met:  12/28/13 Goal: Up in chair Outcome: Completed/Met Date Met:  12/28/13 Goal: Skin without signs of pressure Outcome: Not Applicable Date Met:  69/62/95

## 2013-12-29 NOTE — Clinical Social Work Placement (Signed)
Clinical Social Work Department CLINICAL SOCIAL WORK PLACEMENT NOTE 12/29/2013  Patient:  Elizabeth, [name]  Account Number:  [id number] Admit date:  12/24/2013  Clinical Social Worker:  Vivi Barrack, LCSWA  Date/time:  12/29/2013 10:15 AM  Clinical Social Work is seeking post-discharge placement for this patient at the following level of care:   SKILLED NURSING   (*CSW will update this form in Epic as items are completed)     Patient/family provided with [facility] Department of Clinical Social Work's list of facilities offering this level of care within the geographic area requested by the patient (or if unable, by the patient's family).    Patient/family informed of their freedom to choose among providers that offer the needed level of care, that participate in Medicare, Medicaid or managed care program needed by the patient, have an available bed and are willing to accept the patient.    Patient/family informed of [facility]' ownership interest in [facility], as well as of the fact that they are under no obligation to receive care at this facility.  PASARR submitted to EDS on  PASARR number received on   FL2 transmitted to all facilities in geographic area requested by pt/family on   FL2 transmitted to all facilities within larger geographic area on   Patient informed that his/her managed care company has contracts with or will negotiate with  certain facilities, including the following:     Patient/family informed of bed offers received:   Patient chooses bed at  Physician recommends and patient chooses bed at    Patient to be transferred to [facility] on  12/28/2013 Patient to be transferred to facility by [facility] Patient and family notified of transfer on 12/28/2013 Name of family member notified:  April Dareen Piano (daughter)  The following physician request were entered in Epic:   Additional Comments:  Forensic psychologist,  LCSWA Weekend Clinical Social Worker [phone]

## 2013-12-30 LAB — GLUCOSE, CAPILLARY
GLUCOSE-CAPILLARY: 112 mg/dL — AB (ref 70–99)
GLUCOSE-CAPILLARY: 144 mg/dL — AB (ref 70–99)

## 2014-01-01 LAB — CULTURE, BLOOD (ROUTINE X 2)
Culture: NO GROWTH
Culture: NO GROWTH

## 2014-01-10 ENCOUNTER — Encounter (HOSPITAL_BASED_OUTPATIENT_CLINIC_OR_DEPARTMENT_OTHER): Payer: Medicare Other | Attending: Internal Medicine

## 2014-01-10 DIAGNOSIS — E119 Type 2 diabetes mellitus without complications: Secondary | ICD-10-CM | POA: Diagnosis not present

## 2014-01-10 DIAGNOSIS — Z992 Dependence on renal dialysis: Secondary | ICD-10-CM | POA: Insufficient documentation

## 2014-01-10 DIAGNOSIS — Z7982 Long term (current) use of aspirin: Secondary | ICD-10-CM | POA: Insufficient documentation

## 2014-01-10 DIAGNOSIS — L97829 Non-pressure chronic ulcer of other part of left lower leg with unspecified severity: Secondary | ICD-10-CM | POA: Insufficient documentation

## 2014-01-10 DIAGNOSIS — I120 Hypertensive chronic kidney disease with stage 5 chronic kidney disease or end stage renal disease: Secondary | ICD-10-CM | POA: Diagnosis not present

## 2014-01-10 DIAGNOSIS — N186 End stage renal disease: Secondary | ICD-10-CM | POA: Diagnosis not present

## 2014-01-10 DIAGNOSIS — L97529 Non-pressure chronic ulcer of other part of left foot with unspecified severity: Secondary | ICD-10-CM | POA: Diagnosis not present

## 2014-01-10 DIAGNOSIS — D649 Anemia, unspecified: Secondary | ICD-10-CM | POA: Insufficient documentation

## 2014-01-10 DIAGNOSIS — I2510 Atherosclerotic heart disease of native coronary artery without angina pectoris: Secondary | ICD-10-CM | POA: Insufficient documentation

## 2014-01-10 DIAGNOSIS — E43 Unspecified severe protein-calorie malnutrition: Secondary | ICD-10-CM | POA: Diagnosis not present

## 2014-01-10 DIAGNOSIS — L89154 Pressure ulcer of sacral region, stage 4: Secondary | ICD-10-CM | POA: Diagnosis present

## 2014-01-10 NOTE — Progress Notes (Signed)
[facility]  NAME:  Elizabeth [name], Elizabeth [name]             ACCOUNT NO.:  [id number]  MEDICAL RECORD NO.:  [id number]      DATE OF BIRTH:  Jul 04, 1945  PHYSICIAN:  Maxwell CaulMichael G. Jamiyah Dingley, M.D. VISIT DATE:  01/10/2014                                  OFFICE VISIT   [facility]  HISTORY OF PRESENT ILLNESS:  This is a 68 year old woman who has been at [facility] since late August.  She was last in the hospital from December 24, 2013 through December 28, 2013, noted to have wounds on her left knee and left hip.  MRI of the left knee did not show any osteomyelitis or drainable abscess.  A MRI of the left hip was done without evidence of osteomyelitis.  The patient has end-stage renal disease, on dialysis.  She is here for review of wounds.  PAST MEDICAL HISTORY:  Includes: 1. End-stage renal disease, on dialysis. 2. Hypertension. 3. Coronary artery disease. 4. 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 5. Neurogenic bladder. 6. Chronic anemia. 7. Type 2 diabetes. 8. UTI. 9. Severe protein-calorie malnutrition with an albumin of 1.6.  CURRENT MEDICATIONS:  Includes: 1. Protein supplement daily. 2. Vitamin C 150 mg b.i.d. 3. Aspirin 81 daily. 4. Ciprofloxacin 500 twice daily for 2 weeks. 5. Santyl ointment to the sacrum. 6. Doxycycline x2 weeks. 7. Insulin sliding scale. 8. Oxycodone p.r.n. 9. Pravachol 20 daily. 10.Phenergan 12.5 q.6. 11.Renvela 800 mg 3 times a day. 12.Zinc supplements.  PHYSICAL EXAMINATION:  On examination, this is a somewhat frail-looking woman, but in no distress, able to give her own history.  Temperature is 98.5, pulse 100, respirations 17, blood pressure 149/69.  CBG 124. Wound exam; the area of her her left hip would appear to have no wound. However the sacral area is the current problem. sacral area.  This is the most substantial wound, measuring 3.5 x 4.7 x 0.8.  From almost  circumferentially, there is a wide margin of undermining here, measuring over 5 cm in most directions.  I do not see any palpable bone, there is covering granulation here, although, I have no doubt that this might have been a stage IV wound at some point.  The patient also has areas over her left great toe, left second toe and left lateral knee.  The areas over both her toes were debrided of surface eschar.  Underneath their wounds here that have palpable bone underneath.  The area over her left knee is now very superficial, measuring 0.9 x 0.8 x 0.1.  IMPRESSIONS: 1. Stage IV wound over a large area of her sacrum.  I see no real     reason for Santyl here.  I would simply use Hydrogel wet-to-dry     dressings on this.  Without some improvement in the patient's     nutritional status listed as severe in the hospital with an albumin     of 1.6, I do not think there is much hope of healing a wound like     this.  Specifically until this can improve, I would not recommend a     wound VAC.  I spent some time talking to the patient about this.     Her overall situation is  complicated by no doubt some degree of     pressure when she is sitting at dialysis.  I asked her to try to     consciously sit through her ischial tuberosities rather than to     lean back on her coccyx.  I am not sure how well she is able to     comply with these recommendations.  If there are some improvement     could be had under nutritional status, I would not be opposed to an     attempt that a wound VAC.  Underlying x-rays of her sacrum would     seem in order. 2. Left hip ulcer.  If there was a left hip ulcer here, I do not see     this currently nor do I see anything in the left hip area that     looks like this was recently healed ?. 3. Probably peripheral vascular disease involving her left foot.  I     note that she did have noninvasive arterial studies on October     2014, that only showed mild peripheral  arterial disease.  Her     wounds were debrided.  There is exposed bone in both of these     areas, first and second toes dorsally.  I think these should be     dressed with collagen, Kerlix, and toe socks bilaterally.  The area over her left knee is also very superficial.  It this was once more substantial, it is considerably improved.  There is no evidence of infection here.  A collagen, Hydrogel under a foam dressing would seem to be in order.  We will review this again in 2 week's time.  All of this was discussed with the patient.          ______________________________ Maxwell Caul, M.D.     MGR/MEDQ  D:  01/10/2014  T:  01/10/2014  Job:  [id number]

## 2014-01-24 ENCOUNTER — Encounter (HOSPITAL_BASED_OUTPATIENT_CLINIC_OR_DEPARTMENT_OTHER): Payer: Medicare Other | Attending: Internal Medicine

## 2014-01-24 DIAGNOSIS — E11622 Type 2 diabetes mellitus with other skin ulcer: Secondary | ICD-10-CM | POA: Insufficient documentation

## 2014-01-24 DIAGNOSIS — E1151 Type 2 diabetes mellitus with diabetic peripheral angiopathy without gangrene: Secondary | ICD-10-CM | POA: Insufficient documentation

## 2014-01-24 DIAGNOSIS — I87332 Chronic venous hypertension (idiopathic) with ulcer and inflammation of left lower extremity: Secondary | ICD-10-CM | POA: Insufficient documentation

## 2014-01-24 DIAGNOSIS — L97929 Non-pressure chronic ulcer of unspecified part of left lower leg with unspecified severity: Secondary | ICD-10-CM | POA: Insufficient documentation

## 2014-01-24 DIAGNOSIS — E1142 Type 2 diabetes mellitus with diabetic polyneuropathy: Secondary | ICD-10-CM | POA: Insufficient documentation

## 2014-01-24 DIAGNOSIS — L97311 Non-pressure chronic ulcer of right ankle limited to breakdown of skin: Secondary | ICD-10-CM | POA: Insufficient documentation

## 2014-01-30 ENCOUNTER — Encounter (HOSPITAL_COMMUNITY): Payer: Self-pay | Admitting: Vascular Surgery

## 2014-01-31 DIAGNOSIS — E1142 Type 2 diabetes mellitus with diabetic polyneuropathy: Secondary | ICD-10-CM | POA: Diagnosis not present

## 2014-01-31 DIAGNOSIS — L97311 Non-pressure chronic ulcer of right ankle limited to breakdown of skin: Secondary | ICD-10-CM | POA: Diagnosis not present

## 2014-01-31 DIAGNOSIS — I87332 Chronic venous hypertension (idiopathic) with ulcer and inflammation of left lower extremity: Secondary | ICD-10-CM | POA: Diagnosis not present

## 2014-01-31 DIAGNOSIS — E11622 Type 2 diabetes mellitus with other skin ulcer: Secondary | ICD-10-CM | POA: Diagnosis not present

## 2014-01-31 DIAGNOSIS — E1151 Type 2 diabetes mellitus with diabetic peripheral angiopathy without gangrene: Secondary | ICD-10-CM | POA: Diagnosis not present

## 2014-01-31 DIAGNOSIS — L97929 Non-pressure chronic ulcer of unspecified part of left lower leg with unspecified severity: Secondary | ICD-10-CM | POA: Diagnosis not present

## 2014-02-17 ENCOUNTER — Other Ambulatory Visit: Payer: Self-pay | Admitting: *Deleted

## 2014-02-17 ENCOUNTER — Telehealth: Payer: Self-pay | Admitting: Endocrinology

## 2014-02-17 MED ORDER — GLUCOSE BLOOD VI STRP: ORAL_STRIP | Status: DC

## 2014-02-17 NOTE — Telephone Encounter (Signed)
Patient would like a refill on her test strips   [facility] on [address]   Thank you

## 2014-02-18 ENCOUNTER — Encounter (HOSPITAL_COMMUNITY): Payer: Self-pay

## 2014-02-18 ENCOUNTER — Emergency Department (HOSPITAL_COMMUNITY)
Admission: EM | Admit: 2014-02-18 | Discharge: 2014-02-18 | Disposition: A | Discharge status: Home / Self Care | Payer: Medicare Other | Attending: Emergency Medicine | Admitting: Emergency Medicine

## 2014-02-18 ENCOUNTER — Emergency Department (HOSPITAL_COMMUNITY): Payer: Medicare Other

## 2014-02-18 DIAGNOSIS — Z862 Personal history of diseases of the blood and blood-forming organs and certain disorders involving the immune mechanism: Secondary | ICD-10-CM | POA: Insufficient documentation

## 2014-02-18 DIAGNOSIS — Z9861 Coronary angioplasty status: Secondary | ICD-10-CM | POA: Diagnosis not present

## 2014-02-18 DIAGNOSIS — Z960 Presence of urogenital implants: Secondary | ICD-10-CM | POA: Diagnosis not present

## 2014-02-18 DIAGNOSIS — Z79899 Other long term (current) drug therapy: Secondary | ICD-10-CM | POA: Diagnosis not present

## 2014-02-18 DIAGNOSIS — L98429 Non-pressure chronic ulcer of back with unspecified severity: Secondary | ICD-10-CM

## 2014-02-18 DIAGNOSIS — E119 Type 2 diabetes mellitus without complications: Secondary | ICD-10-CM | POA: Diagnosis not present

## 2014-02-18 DIAGNOSIS — L98499 Non-pressure chronic ulcer of skin of other sites with unspecified severity: Secondary | ICD-10-CM | POA: Diagnosis present

## 2014-02-18 DIAGNOSIS — Z7982 Long term (current) use of aspirin: Secondary | ICD-10-CM | POA: Diagnosis not present

## 2014-02-18 DIAGNOSIS — N184 Chronic kidney disease, stage 4 (severe): Secondary | ICD-10-CM | POA: Insufficient documentation

## 2014-02-18 DIAGNOSIS — R197 Diarrhea, unspecified: Secondary | ICD-10-CM | POA: Insufficient documentation

## 2014-02-18 DIAGNOSIS — Z8673 Personal history of transient ischemic attack (TIA), and cerebral infarction without residual deficits: Secondary | ICD-10-CM | POA: Diagnosis not present

## 2014-02-18 DIAGNOSIS — M1990 Unspecified osteoarthritis, unspecified site: Secondary | ICD-10-CM | POA: Diagnosis not present

## 2014-02-18 DIAGNOSIS — L89154 Pressure ulcer of sacral region, stage 4: Secondary | ICD-10-CM | POA: Diagnosis not present

## 2014-02-18 DIAGNOSIS — L8990 Pressure ulcer of unspecified site, unspecified stage: Secondary | ICD-10-CM

## 2014-02-18 DIAGNOSIS — Z794 Long term (current) use of insulin: Secondary | ICD-10-CM | POA: Insufficient documentation

## 2014-02-18 DIAGNOSIS — I2510 Atherosclerotic heart disease of native coronary artery without angina pectoris: Secondary | ICD-10-CM | POA: Insufficient documentation

## 2014-02-18 DIAGNOSIS — E785 Hyperlipidemia, unspecified: Secondary | ICD-10-CM | POA: Diagnosis not present

## 2014-02-18 DIAGNOSIS — I129 Hypertensive chronic kidney disease with stage 1 through stage 4 chronic kidney disease, or unspecified chronic kidney disease: Secondary | ICD-10-CM | POA: Diagnosis not present

## 2014-02-18 LAB — COMPREHENSIVE METABOLIC PANEL
ALK PHOS: 94 U/L (ref 39–117)
ALT: 27 U/L (ref 0–35)
AST: 35 U/L (ref 0–37)
Albumin: 2.8 g/dL — ABNORMAL LOW (ref 3.5–5.2)
Anion gap: 11 (ref 5–15)
BILIRUBIN TOTAL: 0.5 mg/dL (ref 0.3–1.2)
BUN: 16 mg/dL (ref 6–23)
CHLORIDE: 102 meq/L (ref 96–112)
CO2: 26 mmol/L (ref 19–32)
Calcium: 8.2 mg/dL — ABNORMAL LOW (ref 8.4–10.5)
Creatinine, Ser: 1.91 mg/dL — ABNORMAL HIGH (ref 0.50–1.10)
GFR calc non Af Amer: 26 mL/min — ABNORMAL LOW (ref >90)
GFR, EST AFRICAN AMERICAN: 30 mL/min — AB (ref >90)
GLUCOSE: 145 mg/dL — AB (ref 70–99)
POTASSIUM: 3.9 mmol/L (ref 3.5–5.1)
Sodium: 139 mmol/L (ref 135–145)
Total Protein: 6.4 g/dL (ref 6.0–8.3)

## 2014-02-18 LAB — CBC WITH DIFFERENTIAL/PLATELET
Basophils Absolute: 0 10*3/uL (ref 0.0–0.1)
Basophils Relative: 1 % (ref 0–1)
Eosinophils Absolute: 0.1 10*3/uL (ref 0.0–0.7)
Eosinophils Relative: 1 % (ref 0–5)
HCT: 42.4 % (ref 36.0–46.0)
HEMOGLOBIN: 13.4 g/dL (ref 12.0–15.0)
LYMPHS ABS: 0.8 10*3/uL (ref 0.7–4.0)
Lymphocytes Relative: 13 % (ref 12–46)
MCH: 26.2 pg (ref 26.0–34.0)
MCHC: 31.6 g/dL (ref 30.0–36.0)
MCV: 83 fL (ref 78.0–100.0)
MONOS PCT: 8 % (ref 3–12)
Monocytes Absolute: 0.5 10*3/uL (ref 0.1–1.0)
NEUTROS ABS: 5 10*3/uL (ref 1.7–7.7)
NEUTROS PCT: 78 % — AB (ref 43–77)
PLATELETS: 143 10*3/uL — AB (ref 150–400)
RBC: 5.11 MIL/uL (ref 3.87–5.11)
RDW: 18 % — ABNORMAL HIGH (ref 11.5–15.5)
WBC: 6.4 10*3/uL (ref 4.0–10.5)

## 2014-02-18 NOTE — ED Notes (Signed)
Pt verbalized understanding of discharge instructions and no further questions 

## 2014-02-18 NOTE — Discharge Instructions (Signed)
Continue wound care for sacral ulceration. Return immediately for fever, increased discharge from site, redness or concerns. Pressure Ulcer A pressure ulcer is a sore that has formed from the breakdown of skin and exposure of deeper layers of tissue. It develops in areas of the body where there is unrelieved pressure. Pressure ulcers are usually found over a bony area, such as the shoulder blades, spine, lower back, hips, knees, ankles, and heels. Pressure ulcers vary in severity. Your health care provider may determine the severity (stage) of your pressure ulcer. The stages include:  Stage I--The skin is red, and when the skin is pressed, it stays red.  Stage II--The top layer of skin is gone, and there is a shallow, pink ulcer.  Stage III--The ulcer becomes deeper, and it is more difficult to see the whole wound. Also, there may be yellow or brown parts, as well as pink and red parts.  Stage IV--The ulcer may be deep and red, pink, brown, white, or yellow. Bone or muscle may be seen.  Unstageable pressure ulcer--The ulcer is covered almost completely with black, brown, or yellow tissue. It is not known how deep the ulcer is or what stage it is until this covering comes off.  Suspected deep tissue injury--A person's skin can be injured from pressure or pulling on the skin when his or her position is changed. The skin appears purple or maroon. There may not be an opening in the skin, but there could be a blood-filled blister. This deep tissue injury is often difficult to see in people with darker skin tones. The site may open and become deeper in time. However, early interventions will help the area heal and may prevent the area from opening. CAUSES  Pressure ulcers are caused by pressure against the skin that limits the flow of blood to the skin and nearby tissues. There are many risk factors that can lead to pressure sores. RISK FACTORS  Decreased ability to move.  Decreased ability to feel  pain or discomfort.  Excessive skin moisture from urine, stool, sweat, or secretions.  Poor nutrition.  Dehydration.  Tobacco, drug, or alcohol abuse.  Having someone pull on bedsheets that are under you, such as when health care workers are changing your position in a hospital bed.  Obesity.  Increased adult age.  Hospitalization in a critical care unit for longer than 4 days with use of medical devices.  Prolonged use of medical devices.  Critical illness.  Anemia.  Traumatic brain injury.  Spinal cord injury.  Stroke.  Diabetes.  Poor blood glucose control.  Low blood pressure (hypotension).  Low oxygen levels.  Medicines that reduce blood flow.  Infection. DIAGNOSIS  Your health care provider will diagnose your pressure ulcer based on its appearance. The health care provider may determine the stage of your pressure ulcer as well. Tests may be done to check for infection, to assess your circulation, or to check for other diseases, such as diabetes. TREATMENT  Treatment of your pressure ulcer begins with determining what stage the ulcer is in. Your treatment team may include your health care provider, a wound care specialist, a nutritionist, a physical therapist, and a Careers adviser. Possible treatments may include:   Moving or repositioning every 1-2 hours.  Using beds or mattresses to shift your body weight and pressure points frequently.  Improving your diet.  Cleaning and bandaging (dressing) the open wound.  Giving antibiotic medicines.  Removing damaged tissue.  Surgery and sometimes skin grafts. HOME CARE  INSTRUCTIONS  If you were hospitalized, follow the care plan that was started in the hospital.  Avoid staying in the same position for more than 2 hours. Use padding, devices, or mattresses to cushion your pressure points as directed by your health care provider.  Eat a well-balanced diet. Take nutritional supplements and vitamins as directed by  your health care provider.  Keep all follow-up appointments.  Only take over-the-counter or prescription medicines for pain, fever, or discomfort as directed by your health care provider. SEEK MEDICAL CARE IF:   Your pressure ulcer is not improving.  You do not know how to care for your pressure ulcer.  You notice other areas of redness on your skin.  You have a fever. SEEK IMMEDIATE MEDICAL CARE IF:   You have increasing redness, swelling, or pain in your pressure ulcer.  You notice pus coming from your pressure ulcer.  You notice a bad smell coming from the wound or dressing.  Your pressure ulcer opens up again. Document Released: 02/07/2005 Document Revised: 02/12/2013 Document Reviewed: 10/15/2012 [facility] Patient Information 2015 [place], [place]. This information is not intended to replace advice given to you by your health care provider. Make sure you discuss any questions you have with your health care provider. Diarrhea Diarrhea is frequent loose and watery bowel movements. It can cause you to feel weak and dehydrated. Dehydration can cause you to become tired and thirsty, have a dry mouth, and have decreased urination that often is dark yellow. Diarrhea is a sign of another problem, most often an infection that will not last long. In most cases, diarrhea typically lasts 2-3 days. However, it can last longer if it is a sign of something more serious. It is important to treat your diarrhea as directed by your caregiver to lessen or prevent future episodes of diarrhea. CAUSES  Some common causes include:  Gastrointestinal infections caused by viruses, bacteria, or parasites.  Food poisoning or food allergies.  Certain medicines, such as antibiotics, chemotherapy, and laxatives.  Artificial sweeteners and fructose.  Digestive disorders. HOME CARE INSTRUCTIONS  Ensure adequate fluid intake (hydration): Have 1 cup (8 oz) of fluid for each diarrhea episode. Avoid fluids  that contain simple sugars or sports drinks, fruit juices, whole milk products, and sodas. Your urine should be clear or pale yellow if you are drinking enough fluids. Hydrate with an oral rehydration solution that you can purchase at pharmacies, retail stores, and online. You can prepare an oral rehydration solution at home by mixing the following ingredients together:   - tsp table salt.   tsp baking soda.   tsp salt substitute containing potassium chloride.  1  tablespoons sugar.  1 L (34 oz) of water.  Certain foods and beverages may increase the speed at which food moves through the gastrointestinal (GI) tract. These foods and beverages should be avoided and include:  Caffeinated and alcoholic beverages.  High-fiber foods, such as raw fruits and vegetables, nuts, seeds, and whole grain breads and cereals.  Foods and beverages sweetened with sugar alcohols, such as xylitol, sorbitol, and mannitol.  Some foods may be well tolerated and may help thicken stool including:  Starchy foods, such as rice, toast, pasta, low-sugar cereal, oatmeal, grits, baked potatoes, crackers, and bagels.  Bananas.  Applesauce.  Add probiotic-rich foods to help increase healthy bacteria in the GI tract, such as yogurt and fermented milk products.  Wash your hands well after each diarrhea episode.  Only take over-the-counter or prescription medicines as directed  by your caregiver.  Take a warm bath to relieve any burning or pain from frequent diarrhea episodes. SEEK IMMEDIATE MEDICAL CARE IF:   You are unable to keep fluids down.  You have persistent vomiting.  You have blood in your stool, or your stools are black and tarry.  You do not urinate in 6-8 hours, or there is only a small amount of very dark urine.  You have abdominal pain that increases or localizes.  You have weakness, dizziness, confusion, or light-headedness.  You have a severe headache.  Your diarrhea gets worse or  does not get better.  You have a fever or persistent symptoms for more than 2-3 days.  You have a fever and your symptoms suddenly get worse. MAKE SURE YOU:   Understand these instructions.  Will watch your condition.  Will get help right away if you are not doing well or get worse. Document Released: 01/28/2002 Document Revised: 06/24/2013 Document Reviewed: 10/16/2011 [facility] Patient Information 2015 [place]. This information is not intended to replace advice given to you by your health care provider. Make sure you discuss any questions you have with your health care provider.

## 2014-02-18 NOTE — ED Notes (Signed)
Pt transported by ems from dialysis center. Per ems, Pt has a sacral ulcer and another ulcer right above it that have been there since August and were last looked at on 02/07/14. It was discovered while the pt was in dialysis that diarrhea was coming out of the sacral ulcer. Pt was then sent here. Pt has dialysis catheter on right side of chest. VSS, pt in no acute distress. Pt stated that she has diarrhea that started 2-3 months ago.

## 2014-02-18 NOTE — ED Provider Notes (Signed)
CSN: [id number]     Arrival date & time 02/18/14  1520 History   First MD Initiated Contact with Patient 02/18/14 1608     Chief Complaint  Patient presents with  . Skin Ulcer     (Consider location/radiation/quality/duration/timing/severity/associated sxs/prior Treatment) HPI Patient has a chronic sacral ulcer for which she is receiving wound care. She also has chronic intermittent diarrhea. Patient states she had diarrhea earlier today before going to dialysis. During dialysis her sacral ulcer was examined and noted to have loose stool in it. Concerned that there was internal communication. Sent to the emergency department for further evaluation. Patient states she is feeling at her baseline. She's had no fever or chills. She complains of no pain at this time. She denies any nausea or vomiting. Past Medical History  Diagnosis Date  . Hyperlipidemia   . Urinary retention     DX NEUROGENIC BLADDER--HAS INDWELLING FOLEY CATHETER  . Arthritis   . Anemia   . CAD (coronary artery disease)   . Neuropathic pain   . Shortness of breath     WITH EXERTION  . Stroke     patient not aware.  . Foley catheter in place     for urinary retention  . Critical lower limb ischemia   . Hypertension   . Peripheral vascular disease   . Hypoglycemia 08/19/2013  . Hypokalemia 08/19/2013  . Chronic kidney disease     PT TOLD RECENT TESTING SHOWS KIDNEY DAMAGE--IS SEEING NEPHROLOGIST.  OFFICE NOTES OBTAINED DR. MATTINGLY -"CHRONIC KIDNEY DISEASE 4 MOST LIKELY SECONDARY TO DIABETES"  . Diabetes mellitus without complication     TAKES INSULIN    Past Surgical History  Procedure Laterality Date  . No previous surgery    . Insertion of suprapubic catheter N/A 05/16/2012    Procedure: INSERTION OF SUPRAPUBIC CATHETER;  Surgeon: Sebastian Ache, MD;  Location: [facility]S;  Service: Urology;  Laterality: N/A;  . Bascilic vein transposition Left 10/30/2012    Procedure: LEFT 1ST STAGE BASCILIC VEIN TRANSPOSITION;   Surgeon: Chuck Hint, MD;  Location: [facility] OR;  Service: Vascular;  Laterality: Left;  . Nm myocar perf wall motion  04/19/2012    low risk study-EF 44%,mild inferolateral hypkinesis  . Bascilic vein transposition Left 02/05/2013    Procedure: BASCILIC VEIN TRANSPOSITION 2ND STAGE;  Surgeon: Chuck Hint, MD;  Location: [facility] OR;  Service: Vascular;  Laterality: Left;  . Radiology with anesthesia N/A 10/25/2013    Procedure: RADIOLOGY WITH ANESTHESIA;  Surgeon: Medication Radiologist, MD;  Location: [facility] OR;  Service: Radiology;  Laterality: N/A;  . Fistulogram Left 04/29/2013    Procedure: FISTULOGRAM;  Surgeon: Chuck Hint, MD;  Location: [facility] CATH LAB;  Service: Cardiovascular;  Laterality: Left;  . Angioplasty Left 04/29/2013    Procedure: ANGIOPLASTY;  Surgeon: Chuck Hint, MD;  Location: [facility] CATH LAB;  Service: Cardiovascular;  Laterality: Left;   Family History  Problem Relation Age of Onset  . Hyperlipidemia Mother   . Hypertension Mother   . Hodgkin's lymphoma Father   . Heart disease Sister    History  Substance Use Topics  . Smoking status: Never Smoker   . Smokeless tobacco: Never Used  . Alcohol Use: No   OB History    No data available     Review of Systems  Constitutional: Negative for fever and chills.  Respiratory: Negative for cough and shortness of breath.   Gastrointestinal: Positive for diarrhea. Negative for nausea, vomiting, abdominal pain and  constipation.  Musculoskeletal: Negative for myalgias, back pain, neck pain and neck stiffness.  Skin: Positive for wound.  Neurological: Negative for dizziness, weakness, light-headedness, numbness and headaches.  All other systems reviewed and are negative.     Allergies  Review of patient's allergies indicates no known allergies.  Home Medications   Prior to Admission medications   Medication Sig Start Date End Date Taking? Authorizing Provider  aspirin EC 81 MG tablet Take 81 mg by  mouth daily.   Yes Historical Provider, MD  carvedilol (COREG) 25 MG tablet Take 25 mg by mouth 2 (two) times daily with a meal.   Yes Historical Provider, MD  insulin aspart (NOVOLOG) 100 UNIT/ML injection Inject 0-12 Units into the skin 2 (two) times daily. Sliding scale: 0-149=0; 150-199=2; 200-249=4; 250-299=6; 300-349=8; 350-399=10; 400-499=12; 450> = Call MD   Yes Historical Provider, MD  multivitamin (RENA-VIT) TABS tablet Take 1 tablet by mouth daily.   Yes Historical Provider, MD  oxyCODONE-acetaminophen (PERCOCET/ROXICET) 5-325 MG per tablet Take 1-2 tablets by mouth every 8 (eight) hours as needed for moderate pain or severe pain. 12/28/13  Yes Ripudeep Jenna Luo, MD  pravastatin (PRAVACHOL) 20 MG tablet Take 20 mg by mouth at bedtime.    Yes Historical Provider, MD  sevelamer carbonate (RENVELA) 800 MG tablet Take 1 tablet (800 mg total) by mouth 3 (three) times daily with meals. 12/28/13  Yes Ripudeep Jenna Luo, MD  ciprofloxacin (CIPRO) 500 MG tablet Take 1 tablet (500 mg total) by mouth daily. For 2 weeks 12/28/13   Ripudeep Jenna Luo, MD  doxycycline (VIBRAMYCIN) 100 MG capsule Take 1 capsule (100 mg total) by mouth 2 (two) times daily. X 2 weeks 12/28/13   Ripudeep Jenna Luo, MD  glucose blood (ONETOUCH VERIO) test strip Use as instructed to check blood sugar 4 times per day dx code E13.10 02/17/14   [name], MD  Nutritional Supplements (FEEDING SUPPLEMENT, NEPRO CARB STEADY,) LIQD Take 237 mLs by mouth as needed (missed meal during dialysis.). Patient taking differently: Take 237 mLs by mouth daily.  10/24/13   Belkys A Regalado, MD  Nutritional Supplements (FEEDING SUPPLEMENT, NEPRO CARB STEADY,) LIQD Take 237 mLs by mouth 3 (three) times daily between meals. 12/28/13   Ripudeep Jenna Luo, MD  ONETOUCH DELICA LANCETS FINE MISC Use to check blood sugar 4 times per day 09/16/13   [name], MD  promethazine (PHENERGAN) 12.5 MG tablet Take 1 tablet (12.5 mg total) by mouth every 6 (six) hours as needed for  nausea or vomiting. 12/28/13   Ripudeep K Rai, MD   BP 148/78 mmHg  Pulse 92  Temp(Src) 98 F (36.7 C) (Oral)  Resp 14  Ht 5\' 3"  (1.6 m)  Wt 86 lb (39.009 kg)  BMI 15.24 kg/m2  SpO2 100% Physical Exam  Constitutional: She is oriented to person, place, and time. She appears well-developed and well-nourished. No distress.  Frail-appearing  HENT:  Head: Normocephalic and atraumatic.  Mouth/Throat: Oropharynx is clear and moist.  Eyes: EOM are normal. Pupils are equal, round, and reactive to light.  Neck: Normal range of motion. Neck supple.  Cardiovascular: Normal rate and regular rhythm.   Pulmonary/Chest: Effort normal and breath sounds normal. No respiratory distress. She has no wheezes. She has no rales.  Abdominal: Soft. Bowel sounds are normal. She exhibits no distension and no mass. There is no tenderness. There is no rebound and no guarding.  Musculoskeletal: Normal range of motion. She exhibits no edema or tenderness.  Neurological: She is alert and oriented to person, place, and time.  Skin: Skin is warm and dry. No rash noted. No erythema.  Stage IV left sacral ulcer. Small amount of mucoid discharge. Feces noted surrounding the ulcer but none within the cavity. No obvious evidence of infection.  Psychiatric: She has a normal mood and affect. Her behavior is normal.  Nursing note and vitals reviewed.   ED Course  Procedures (including critical care time) Labs Review Labs Reviewed  CBC WITH DIFFERENTIAL - Abnormal; Notable for the following:    RDW 18.0 (*)    Platelets 143 (*)    Neutrophils Relative % 78 (*)    All other components within normal limits  COMPREHENSIVE METABOLIC PANEL - Abnormal; Notable for the following:    Glucose, Bld 145 (*)    Creatinine, Ser 1.91 (*)    Calcium 8.2 (*)    Albumin 2.8 (*)    GFR calc non Af Amer 26 (*)    GFR calc Af Amer 30 (*)    All other components within normal limits    Imaging Review Ct Abdomen Pelvis Wo  Contrast  02/18/2014   CLINICAL DATA:  68 year old female with sacral ulcer complaining of pain.  EXAM: CT ABDOMEN AND PELVIS WITHOUT CONTRAST  TECHNIQUE: Multidetector CT imaging of the abdomen and pelvis was performed following the standard protocol without IV contrast.  COMPARISON:  MRI of the left hip 12/27/2013.  FINDINGS: Lower chest: Small right pleural effusion. Areas of subsegmental atelectasis in the visualize lung bases bilaterally. Atherosclerosis in the distal thoracic aorta.  Hepatobiliary: No discrete cystic or solid hepatic lesions are identified on today's non contrast CT examination. Gallbladder appears moderately distended, with some dependent intermediate attenuation material, which may reflect a small amount of biliary sludge. No calcified gallstones noted.  Pancreas: The pancreas is poorly visualized on today's non contrast CT examination, but is grossly unremarkable.  Spleen: Unremarkable.  Adrenals/Urinary Tract: Multiple renal lesions are noted bilaterally, incompletely characterized on today's non contrast CT examination. The largest of these is a 2.5 x 2.1 cm low-attenuation lesion in the anterior aspect of the upper pole of the left kidney, which demonstrates extensive mural calcification. There are also small low-attenuation lesions in the kidneys bilaterally, and some intermediate to high attenuation lesions, which may reflect hemorrhagic or proteinaceous cyst (but incompletely characterized). No hydroureteronephrosis. Urinary bladder is completely decompressed with a Foley balloon catheter in place. Tiny locule of gas non dependently in the urinary bladder is presumably iatrogenic.  Stomach/Bowel: The unenhanced appearance of the stomach is unremarkable. No pathologic dilatation of small bowel or colon. Diffuse mesenteric edema.  Vascular/Lymphatic: Extensive atherosclerosis throughout the abdominal and pelvic vasculature, without definite aneurysm. No definite lymphadenopathy  identified in the abdomen or pelvis on today's non contrast CT examination, although tissue contrast is extremely poor secondary to diffuse soft tissue edema.  Reproductive: Uterus and ovaries are atrophic. 2.4 cm intermediate to high attenuation lesion which appears to be associated with the right side of the uterine fundus, likely a fibroid.  Other: Trace volume of ascites. Diffuse mesenteric and soft tissue edema. No pneumoperitoneum.  Musculoskeletal: There are no aggressive appearing lytic or blastic lesions noted in the visualized portions of the skeleton. Specifically, no definite osteolysis in the sacrum. Soft tissue defect posterior to the sacrum, compatible with the reported decubitus ulcer.  IMPRESSION: 1. Soft tissue defect posterior to the sacrum compatible with the reported decubitus ulcer. No osteolysis in the underlying sacrum to suggest  associated osteomyelitis at this time. 2. Diffuse soft tissue and mesenteric edema, small right pleural effusion and small volume of ascites; imaging findings suggestive of a state of anasarca. 3. Multiple renal lesions noted bilaterally, incompletely characterized on today's non contrast CT examination. One of these lesions in the upper pole of the left kidney has some calcifications in the wall. Further characterization with followup nonemergent MRI of the abdomen with and without IV gadolinium is recommended in the near future to better characterize these findings, to exclude the possibility of underlying malignancy. 4. Extensive atherosclerosis. 5. Additional incidental findings, as above.   Electronically Signed   By: Trudie Reed M.D.   On: 02/18/2014 19:04     EKG Interpretation None      MDM   Final diagnoses:  Decubitus skin ulcer  Sacral ulcer  Diarrhea    CT without any evidence of osteomyelitis or abscess. Bowel sounds remained stable. Workup is otherwise unremarkable. When was thoroughly cleaned dry dressing was placed. Patient will  continue to follow up with wound care. No evidence of infection this point. Return precautions given.   Loren Racer, MD 02/18/14 2329

## 2014-02-21 DIAGNOSIS — I2510 Atherosclerotic heart disease of native coronary artery without angina pectoris: Secondary | ICD-10-CM

## 2014-02-21 HISTORY — DX: Atherosclerotic heart disease of native coronary artery without angina pectoris: I25.10

## 2014-02-26 ENCOUNTER — Encounter (HOSPITAL_BASED_OUTPATIENT_CLINIC_OR_DEPARTMENT_OTHER): Payer: Medicare Other | Attending: General Surgery

## 2014-02-26 DIAGNOSIS — L97529 Non-pressure chronic ulcer of other part of left foot with unspecified severity: Secondary | ICD-10-CM | POA: Insufficient documentation

## 2014-02-26 DIAGNOSIS — L89154 Pressure ulcer of sacral region, stage 4: Secondary | ICD-10-CM | POA: Diagnosis not present

## 2014-02-26 DIAGNOSIS — E11621 Type 2 diabetes mellitus with foot ulcer: Secondary | ICD-10-CM | POA: Insufficient documentation

## 2014-03-03 ENCOUNTER — Other Ambulatory Visit: Payer: Self-pay | Admitting: *Deleted

## 2014-03-03 DIAGNOSIS — Z01810 Encounter for preprocedural cardiovascular examination: Secondary | ICD-10-CM

## 2014-03-03 DIAGNOSIS — N186 End stage renal disease: Secondary | ICD-10-CM

## 2014-03-05 DIAGNOSIS — E11621 Type 2 diabetes mellitus with foot ulcer: Secondary | ICD-10-CM | POA: Diagnosis not present

## 2014-03-05 DIAGNOSIS — L89154 Pressure ulcer of sacral region, stage 4: Secondary | ICD-10-CM | POA: Diagnosis not present

## 2014-03-05 DIAGNOSIS — L97529 Non-pressure chronic ulcer of other part of left foot with unspecified severity: Secondary | ICD-10-CM | POA: Diagnosis not present

## 2014-03-10 ENCOUNTER — Ambulatory Visit (INDEPENDENT_AMBULATORY_CARE_PROVIDER_SITE_OTHER): Payer: Medicare Other | Admitting: Endocrinology

## 2014-03-10 ENCOUNTER — Encounter: Payer: Self-pay | Admitting: Endocrinology

## 2014-03-10 VITALS — BP 112/70 | HR 87 | Temp 97.8°F | Resp 14

## 2014-03-10 DIAGNOSIS — Z794 Long term (current) use of insulin: Secondary | ICD-10-CM

## 2014-03-10 DIAGNOSIS — E119 Type 2 diabetes mellitus without complications: Secondary | ICD-10-CM

## 2014-03-10 DIAGNOSIS — E1065 Type 1 diabetes mellitus with hyperglycemia: Secondary | ICD-10-CM

## 2014-03-10 DIAGNOSIS — E1142 Type 2 diabetes mellitus with diabetic polyneuropathy: Secondary | ICD-10-CM

## 2014-03-10 DIAGNOSIS — IMO0002 Reserved for concepts with insufficient information to code with codable children: Secondary | ICD-10-CM

## 2014-03-10 LAB — GLUCOSE, POCT (MANUAL RESULT ENTRY): POC GLUCOSE: 236 mg/dL — AB (ref 70–99)

## 2014-03-10 NOTE — Progress Notes (Signed)
Patient ID: Elizabeth [name], female   DOB: 1945-06-19, 69 y.o.   MRN: [id number]    Reason for Appointment: Follow-up for Diabetes  Referring physician: Lerry Liner  History of Present Illness:          Diagnosis: Type 2 diabetes mellitus, date of diagnosis: 1996        Past history:  She was initially treated with metformin and glyburide and her detailed history or level of control is not available She was switched to insulin probably in 2013 She had been taking a regimen of Lantus at night and NovoLog before meals prior to her initial consultation  Recent history:  She has not been seen in follow-up since her initial consultation in 7/15 At that time she had been having poor control with mostly high readings partly because of taking inconsistent Lantus doses Also had been admitted once for hypoglycemia  Since her admission to the hospital in 11/15 for other medical problems she has only been taking NovoLog before meals as needed Not clear why her Lantus was stopped, previously was taking 10 units She has not brought her blood sugar monitor for review but thinks that she is checking her blood sugars 3 times a day although only once today Current insulin regimen: Only NovoLog 2-12 units as needed for blood sugars over 150 However not clear if she is following this regimen as today even with glucose 245 at breakfast she took only 2 units and none for lunch Her blood sugars have been mostly high at home and only at lunchtime below 200 by history Surprisingly her A1c was only 6.9% in 11/15, previously as high as 12.5%   Although her appetite was decreased during her hospitalization she says this is improving now and she is gaining back some weight   INSULIN regimen is described as: Lantus 0 units at bedtime, NovoLog ssi Compliance with the medical regimen: Fair Hypoglycemia: None for several months    Glucose monitoring:  done 3 times  a day         Glucometer: ? Verio Am 200-250;  acl 175-200; acs 200-250 Blood Glucose readings are not clearly documented on her records Blood sugars in the morning fluctuate between 145-494, recently higher. Evening readings and 49-384, mostly high  Glycemic control:    Lab Results  Component Value Date   HGBA1C 6.9* 12/25/2013   HGBA1C 7.3* 10/18/2013   HGBA1C 7.4* 04/24/2012   Lab Results  Component Value Date   LDLCALC 106* 10/18/2013   CREATININE 1.91* 02/18/2014    Retinal exam: Most recent: ?  Self-care: The diet that the patient has been following is: None       Meals: 3 meals per day. Breakfast is variable, lunch is a sandwich and ginger ale. Mixed meal at dinner, snacks are sugar-free desserts, fruit or applesauce         Exercise: Minimal           Dietician visit: Most recent: Several years ago.  consultation with CDE in 09/2013              Weight history:  Wt Readings from Last 3 Encounters:  02/18/14 86 lb (39.009 kg)  12/28/13 82 lb 14.3 oz (37.6 kg)  12/27/13 84 lb (38.102 kg)       Medication List       This list is accurate as of: 03/10/14  9:01 PM.  Always use your most recent med list.  aspirin EC 81 MG tablet  Take 81 mg by mouth daily.     carvedilol 25 MG tablet  Commonly known as:  COREG  Take 25 mg by mouth 2 (two) times daily with a meal.     feeding supplement (NEPRO CARB STEADY) Liqd  Take 237 mLs by mouth as needed (missed meal during dialysis.).     feeding supplement (NEPRO CARB STEADY) Liqd  Take 237 mLs by mouth 3 (three) times daily between meals.     glucose blood test strip  Commonly known as:  ONETOUCH VERIO  Use as instructed to check blood sugar 4 times per day dx code E13.10     insulin aspart 100 UNIT/ML injection  Commonly known as:  novoLOG  Inject 0-12 Units into the skin 2 (two) times daily. Sliding scale: 0-149=0; 150-199=2; 200-249=4; 250-299=6; 300-349=8; 350-399=10; 400-499=12; 450> = Call MD     multivitamin Tabs tablet  Take 1 tablet  by mouth daily.     ONETOUCH DELICA LANCETS FINE Misc  Use to check blood sugar 4 times per day     oxyCODONE-acetaminophen 5-325 MG per tablet  Commonly known as:  PERCOCET/ROXICET  Take 1-2 tablets by mouth every 8 (eight) hours as needed for moderate pain or severe pain.     pravastatin 20 MG tablet  Commonly known as:  PRAVACHOL  Take 20 mg by mouth at bedtime.     promethazine 12.5 MG tablet  Commonly known as:  PHENERGAN  Take 1 tablet (12.5 mg total) by mouth every 6 (six) hours as needed for nausea or vomiting.     sevelamer carbonate 800 MG tablet  Commonly known as:  RENVELA  Take 1 tablet (800 mg total) by mouth 3 (three) times daily with meals.        Allergies: No Known Allergies  Past Medical History  Diagnosis Date  . Hyperlipidemia   . Urinary retention     DX NEUROGENIC BLADDER--HAS INDWELLING FOLEY CATHETER  . Arthritis   . Anemia   . CAD (coronary artery disease)   . Neuropathic pain   . Shortness of breath     WITH EXERTION  . Stroke     patient not aware.  . Foley catheter in place     for urinary retention  . Critical lower limb ischemia   . Hypertension   . Peripheral vascular disease   . Hypoglycemia 08/19/2013  . Hypokalemia 08/19/2013  . Chronic kidney disease     PT TOLD RECENT TESTING SHOWS KIDNEY DAMAGE--IS SEEING NEPHROLOGIST.  OFFICE NOTES OBTAINED DR. MATTINGLY -"CHRONIC KIDNEY DISEASE 4 MOST LIKELY SECONDARY TO DIABETES"  . Diabetes mellitus without complication     TAKES INSULIN     Past Surgical History  Procedure Laterality Date  . No previous surgery    . Insertion of suprapubic catheter N/A 05/16/2012    Procedure: INSERTION OF SUPRAPUBIC CATHETER;  Surgeon: Sebastian Ache, MD;  Location: [facility] ORS;  Service: Urology;  Laterality: N/A;  . Bascilic vein transposition Left 10/30/2012    Procedure: LEFT 1ST STAGE BASCILIC VEIN TRANSPOSITION;  Surgeon: Chuck Hint, MD;  Location: [facility] OR;  Service: Vascular;  Laterality:  Left;  . Nm myocar perf wall motion  04/19/2012    low risk study-EF 44%,mild inferolateral hypkinesis  . Bascilic vein transposition Left 02/05/2013    Procedure: BASCILIC VEIN TRANSPOSITION 2ND STAGE;  Surgeon: Chuck Hint, MD;  Location: [facility] OR;  Service: Vascular;  Laterality: Left;  . Radiology  with anesthesia N/A 10/25/2013    Procedure: RADIOLOGY WITH ANESTHESIA;  Surgeon: Medication Radiologist, MD;  Location: [facility] OR;  Service: Radiology;  Laterality: N/A;  . Fistulogram Left 04/29/2013    Procedure: FISTULOGRAM;  Surgeon: Chuck Hint, MD;  Location: [facility] CATH LAB;  Service: Cardiovascular;  Laterality: Left;  . Angioplasty Left 04/29/2013    Procedure: ANGIOPLASTY;  Surgeon: Chuck Hint, MD;  Location: [facility] CATH LAB;  Service: Cardiovascular;  Laterality: Left;    Family History  Problem Relation Age of Onset  . Hyperlipidemia Mother   . Hypertension Mother   . Hodgkin's lymphoma Father   . Heart disease Sister     Social History:  reports that she has never smoked. She has never used smokeless tobacco. She reports that she does not drink alcohol or use illicit drugs.    Review of Systems   Sacral ulcer: This occurred during her hospitalization and she is being treated for this  Eyes: She has poor vision for unknown reasons       Lipids: . Taking pravastatin 20 mg       Lab Results  Component Value Date   CHOL 210* 10/18/2013   HDL 85 10/18/2013   LDLCALC 106* 10/18/2013   TRIG 96 10/18/2013   CHOLHDL 2.5 10/18/2013                   Skin: No rash. 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 Has mild decrease in monofilament  sensation in all toes.  Physical Examination:  BP 112/70 mmHg  Pulse 87  Temp(Src) 97.8 F (36.6 C)  Resp 14  Ht   Wt   SpO2 99%     ASSESSMENT:  Diabetes type 2, insulin requiring with generally poor control Although her A1c was not high in 11/15 and she recently has significantly high readings are all related to her not taking Lantus insulin See history of present illness for current management, problems identified in blood sugar patterns Difficult to assess her control as she is probably not following her insulin instructions accurately and not clear if she is checking her blood sugar regularly also She does not always have balanced meals, currently not very active also She has had only minimal diabetes education  History of diabetic neuropathy: She is currently being followed by wound clinic for toe ulcer  Hyperlipidemia: Inadequately controlled with pravastatin, will repeat levels on the next visit  She will be referred to a primary care physician for general medical care  PLAN:   Discussed principles of mealtime insulin to cover both foot intake as well as correction doses for high readings.  She will start at least 2 units to cover her meals and 3 units for her main meals of NovoLog since she is eating fairly well now  She will take a simplified correction dose for high readings over 200  She will start with 6 units of Lantus at suppertime and follow-up with nurse educator for further adjustment, discussed that she will probably will need at least 7-8 units  She will also try to keep balanced meals  with some protein consistently  Discussed need for checking some blood sugars after evening meal also to help adjust NovoLog dose  She will take her Lantus doses consistently regardless of dialysis days  Patient Instructions  LANTUS 6 UNITS AT SUPPER and go to 7 or 8 if am sugar stays over 150 next week  Novolog 2 UNITS FOR SMALL MEALS AND 3 UNITS FOR LARGER MEALS    And take EXTRA NOVOLOG FOR high sugars:  if sugar is over 200 add 1 unit; over 250 add 2 units;  Check sugar every 2 days at bedtime   Counseling time over 50% of today's 25 minute visit  Katie Moch 03/10/2014, 9:01 PM   Note: This office note was prepared with Insurance underwriter. Any transcriptional errors that result from this process are unintentional.

## 2014-03-10 NOTE — Patient Instructions (Addendum)
LANTUS 6 UNITS AT SUPPER and go to 7 or 8 if am sugar stays over 150 next week  Novolog 2 UNITS FOR SMALL MEALS AND 3 UNITS FOR LARGER MEALS   And take EXTRA NOVOLOG FOR high sugars:  if sugar is over 200 add 1 unit; over 250 add 2 units;  Check sugar every 2 days at bedtime

## 2014-03-17 ENCOUNTER — Inpatient Hospital Stay (HOSPITAL_COMMUNITY): Payer: Medicare Other

## 2014-03-17 ENCOUNTER — Emergency Department (HOSPITAL_COMMUNITY): Payer: Medicare Other

## 2014-03-17 ENCOUNTER — Encounter: Payer: Medicare Other | Admitting: Nutrition

## 2014-03-17 ENCOUNTER — Encounter (HOSPITAL_COMMUNITY): Payer: Self-pay | Admitting: Emergency Medicine

## 2014-03-17 ENCOUNTER — Inpatient Hospital Stay (HOSPITAL_COMMUNITY)
Admission: EM | Admit: 2014-03-17 | Discharge: 2014-03-28 | DRG: 252 | Disposition: A | Discharge status: Skilled Nursing Facility | Payer: Medicare Other | Attending: Internal Medicine | Admitting: Internal Medicine

## 2014-03-17 DIAGNOSIS — Z992 Dependence on renal dialysis: Secondary | ICD-10-CM | POA: Diagnosis not present

## 2014-03-17 DIAGNOSIS — Z681 Body mass index (BMI) 19 or less, adult: Secondary | ICD-10-CM

## 2014-03-17 DIAGNOSIS — I998 Other disorder of circulatory system: Secondary | ICD-10-CM | POA: Diagnosis present

## 2014-03-17 DIAGNOSIS — E1165 Type 2 diabetes mellitus with hyperglycemia: Secondary | ICD-10-CM | POA: Diagnosis present

## 2014-03-17 DIAGNOSIS — E785 Hyperlipidemia, unspecified: Secondary | ICD-10-CM | POA: Diagnosis present

## 2014-03-17 DIAGNOSIS — I70262 Atherosclerosis of native arteries of extremities with gangrene, left leg: Secondary | ICD-10-CM | POA: Diagnosis not present

## 2014-03-17 DIAGNOSIS — L089 Local infection of the skin and subcutaneous tissue, unspecified: Secondary | ICD-10-CM

## 2014-03-17 DIAGNOSIS — I959 Hypotension, unspecified: Secondary | ICD-10-CM | POA: Diagnosis not present

## 2014-03-17 DIAGNOSIS — R627 Adult failure to thrive: Secondary | ICD-10-CM | POA: Diagnosis present

## 2014-03-17 DIAGNOSIS — I2510 Atherosclerotic heart disease of native coronary artery without angina pectoris: Secondary | ICD-10-CM | POA: Diagnosis present

## 2014-03-17 DIAGNOSIS — R64 Cachexia: Secondary | ICD-10-CM | POA: Diagnosis present

## 2014-03-17 DIAGNOSIS — L03116 Cellulitis of left lower limb: Secondary | ICD-10-CM | POA: Diagnosis present

## 2014-03-17 DIAGNOSIS — Z01818 Encounter for other preprocedural examination: Secondary | ICD-10-CM | POA: Diagnosis not present

## 2014-03-17 DIAGNOSIS — L89154 Pressure ulcer of sacral region, stage 4: Secondary | ICD-10-CM | POA: Diagnosis present

## 2014-03-17 DIAGNOSIS — N2581 Secondary hyperparathyroidism of renal origin: Secondary | ICD-10-CM | POA: Diagnosis present

## 2014-03-17 DIAGNOSIS — I739 Peripheral vascular disease, unspecified: Secondary | ICD-10-CM | POA: Diagnosis present

## 2014-03-17 DIAGNOSIS — D638 Anemia in other chronic diseases classified elsewhere: Secondary | ICD-10-CM | POA: Diagnosis present

## 2014-03-17 DIAGNOSIS — E119 Type 2 diabetes mellitus without complications: Secondary | ICD-10-CM

## 2014-03-17 DIAGNOSIS — I255 Ischemic cardiomyopathy: Secondary | ICD-10-CM | POA: Diagnosis present

## 2014-03-17 DIAGNOSIS — E1310 Other specified diabetes mellitus with ketoacidosis without coma: Secondary | ICD-10-CM | POA: Diagnosis not present

## 2014-03-17 DIAGNOSIS — Z8249 Family history of ischemic heart disease and other diseases of the circulatory system: Secondary | ICD-10-CM

## 2014-03-17 DIAGNOSIS — Z01810 Encounter for preprocedural cardiovascular examination: Secondary | ICD-10-CM | POA: Diagnosis not present

## 2014-03-17 DIAGNOSIS — Z7982 Long term (current) use of aspirin: Secondary | ICD-10-CM

## 2014-03-17 DIAGNOSIS — L03119 Cellulitis of unspecified part of limb: Secondary | ICD-10-CM | POA: Diagnosis present

## 2014-03-17 DIAGNOSIS — I252 Old myocardial infarction: Secondary | ICD-10-CM

## 2014-03-17 DIAGNOSIS — I5020 Unspecified systolic (congestive) heart failure: Secondary | ICD-10-CM | POA: Diagnosis present

## 2014-03-17 DIAGNOSIS — I519 Heart disease, unspecified: Secondary | ICD-10-CM | POA: Diagnosis present

## 2014-03-17 DIAGNOSIS — I120 Hypertensive chronic kidney disease with stage 5 chronic kidney disease or end stage renal disease: Secondary | ICD-10-CM | POA: Diagnosis present

## 2014-03-17 DIAGNOSIS — R339 Retention of urine, unspecified: Secondary | ICD-10-CM | POA: Diagnosis present

## 2014-03-17 DIAGNOSIS — I96 Gangrene, not elsewhere classified: Secondary | ICD-10-CM | POA: Diagnosis present

## 2014-03-17 DIAGNOSIS — Z794 Long term (current) use of insulin: Secondary | ICD-10-CM | POA: Diagnosis not present

## 2014-03-17 DIAGNOSIS — E43 Unspecified severe protein-calorie malnutrition: Secondary | ICD-10-CM | POA: Diagnosis present

## 2014-03-17 DIAGNOSIS — I5189 Other ill-defined heart diseases: Secondary | ICD-10-CM | POA: Diagnosis present

## 2014-03-17 DIAGNOSIS — I10 Essential (primary) hypertension: Secondary | ICD-10-CM | POA: Diagnosis not present

## 2014-03-17 DIAGNOSIS — R54 Age-related physical debility: Secondary | ICD-10-CM | POA: Diagnosis present

## 2014-03-17 DIAGNOSIS — G629 Polyneuropathy, unspecified: Secondary | ICD-10-CM

## 2014-03-17 DIAGNOSIS — R931 Abnormal findings on diagnostic imaging of heart and coronary circulation: Secondary | ICD-10-CM | POA: Diagnosis not present

## 2014-03-17 DIAGNOSIS — I70229 Atherosclerosis of native arteries of extremities with rest pain, unspecified extremity: Secondary | ICD-10-CM | POA: Diagnosis present

## 2014-03-17 DIAGNOSIS — Z8673 Personal history of transient ischemic attack (TIA), and cerebral infarction without residual deficits: Secondary | ICD-10-CM

## 2014-03-17 DIAGNOSIS — L97529 Non-pressure chronic ulcer of other part of left foot with unspecified severity: Secondary | ICD-10-CM | POA: Diagnosis present

## 2014-03-17 DIAGNOSIS — N186 End stage renal disease: Secondary | ICD-10-CM | POA: Diagnosis present

## 2014-03-17 DIAGNOSIS — L0390 Cellulitis, unspecified: Secondary | ICD-10-CM

## 2014-03-17 DIAGNOSIS — E1129 Type 2 diabetes mellitus with other diabetic kidney complication: Secondary | ICD-10-CM | POA: Diagnosis not present

## 2014-03-17 LAB — CBC WITH DIFFERENTIAL/PLATELET
Basophils Absolute: 0.1 10*3/uL (ref 0.0–0.1)
Basophils Relative: 0 % (ref 0–1)
Eosinophils Absolute: 0.1 10*3/uL (ref 0.0–0.7)
Eosinophils Relative: 0 % (ref 0–5)
HEMATOCRIT: 32.9 % — AB (ref 36.0–46.0)
HEMOGLOBIN: 10.4 g/dL — AB (ref 12.0–15.0)
Lymphocytes Relative: 5 % — ABNORMAL LOW (ref 12–46)
Lymphs Abs: 0.6 10*3/uL — ABNORMAL LOW (ref 0.7–4.0)
MCH: 26 pg (ref 26.0–34.0)
MCHC: 31.6 g/dL (ref 30.0–36.0)
MCV: 82.3 fL (ref 78.0–100.0)
MONO ABS: 0.6 10*3/uL (ref 0.1–1.0)
Monocytes Relative: 5 % (ref 3–12)
NEUTROS ABS: 10.4 10*3/uL — AB (ref 1.7–7.7)
Neutrophils Relative %: 90 % — ABNORMAL HIGH (ref 43–77)
PLATELETS: 177 10*3/uL (ref 150–400)
RBC: 4 MIL/uL (ref 3.87–5.11)
RDW: 17.4 % — AB (ref 11.5–15.5)
WBC: 11.6 10*3/uL — ABNORMAL HIGH (ref 4.0–10.5)

## 2014-03-17 LAB — BASIC METABOLIC PANEL
Anion gap: 11 (ref 5–15)
BUN: 42 mg/dL — ABNORMAL HIGH (ref 6–23)
CALCIUM: 9 mg/dL (ref 8.4–10.5)
CO2: 26 mmol/L (ref 19–32)
Chloride: 104 mmol/L (ref 96–112)
Creatinine, Ser: 3.63 mg/dL — ABNORMAL HIGH (ref 0.50–1.10)
GFR calc non Af Amer: 12 mL/min — ABNORMAL LOW (ref >90)
GFR, EST AFRICAN AMERICAN: 14 mL/min — AB (ref >90)
GLUCOSE: 126 mg/dL — AB (ref 70–99)
POTASSIUM: 4.5 mmol/L (ref 3.5–5.1)
Sodium: 141 mmol/L (ref 135–145)

## 2014-03-17 LAB — GLUCOSE, CAPILLARY: Glucose-Capillary: 225 mg/dL — ABNORMAL HIGH (ref 70–99)

## 2014-03-17 LAB — SEDIMENTATION RATE: Sed Rate: 85 mm/hr — ABNORMAL HIGH (ref 0–22)

## 2014-03-17 MED ORDER — CARVEDILOL 6.25 MG PO TABS
6.2500 mg | ORAL_TABLET | Freq: Two times a day (BID) | ORAL | Status: DC
  Filled 2014-03-17 (×3): qty 1

## 2014-03-17 MED ORDER — OXYCODONE-ACETAMINOPHEN 5-325 MG PO TABS
1.0000 | ORAL_TABLET | Freq: Three times a day (TID) | ORAL | Status: DC | PRN
  Administered 2014-03-17 – 2014-03-28 (×7): 1 via ORAL
  Filled 2014-03-17 (×6): qty 1

## 2014-03-17 MED ORDER — PIPERACILLIN-TAZOBACTAM IN DEX 2-0.25 GM/50ML IV SOLN
2.2500 g | Freq: Three times a day (TID) | INTRAVENOUS | Status: DC
  Filled 2014-03-17 (×2): qty 50

## 2014-03-17 MED ORDER — ACETAMINOPHEN 325 MG PO TABS: 650.0000 mg | ORAL_TABLET | Freq: Four times a day (QID) | ORAL | Status: DC | PRN

## 2014-03-17 MED ORDER — PIPERACILLIN-TAZOBACTAM IN DEX 2-0.25 GM/50ML IV SOLN: 2.2500 g | Freq: Four times a day (QID) | INTRAVENOUS | Status: DC

## 2014-03-17 MED ORDER — ASPIRIN EC 81 MG PO TBEC
81.0000 mg | DELAYED_RELEASE_TABLET | Freq: Every day | ORAL | Status: DC
  Administered 2014-03-17 – 2014-03-28 (×9): 81 mg via ORAL
  Filled 2014-03-17 (×12): qty 1

## 2014-03-17 MED ORDER — VANCOMYCIN HCL IN DEXTROSE 750-5 MG/150ML-% IV SOLN
750.0000 mg | Freq: Once | INTRAVENOUS | Status: AC
  Administered 2014-03-17: 750 mg via INTRAVENOUS
  Filled 2014-03-17: qty 150

## 2014-03-17 MED ORDER — PIPERACILLIN-TAZOBACTAM IN DEX 2-0.25 GM/50ML IV SOLN
2.2500 g | Freq: Three times a day (TID) | INTRAVENOUS | Status: DC
  Administered 2014-03-17 – 2014-03-27 (×28): 2.25 g via INTRAVENOUS
  Filled 2014-03-17 (×33): qty 50

## 2014-03-17 MED ORDER — VANCOMYCIN HCL 500 MG IV SOLR
500.0000 mg | INTRAVENOUS | Status: DC
  Administered 2014-03-18 – 2014-03-25 (×4): 500 mg via INTRAVENOUS
  Filled 2014-03-17 (×8): qty 500

## 2014-03-17 MED ORDER — ONDANSETRON HCL 4 MG PO TABS: 4.0000 mg | ORAL_TABLET | Freq: Four times a day (QID) | ORAL | Status: DC | PRN

## 2014-03-17 MED ORDER — ONDANSETRON HCL 4 MG/2ML IJ SOLN: 4.0000 mg | Freq: Four times a day (QID) | INTRAMUSCULAR | Status: DC | PRN

## 2014-03-17 MED ORDER — INSULIN ASPART 100 UNIT/ML ~~LOC~~ SOLN
0.0000 [IU] | Freq: Three times a day (TID) | SUBCUTANEOUS | Status: DC
Start: 2014-03-18 — End: 2014-03-28
  Administered 2014-03-19: 11 [IU] via SUBCUTANEOUS
  Administered 2014-03-20: 8 [IU] via SUBCUTANEOUS
  Administered 2014-03-21: 5 [IU] via SUBCUTANEOUS
  Administered 2014-03-23 – 2014-03-25 (×2): 3 [IU] via SUBCUTANEOUS
  Administered 2014-03-25 (×2): 2 [IU] via SUBCUTANEOUS
  Administered 2014-03-27: 3 [IU] via SUBCUTANEOUS
  Administered 2014-03-28 (×2): 5 [IU] via SUBCUTANEOUS

## 2014-03-17 MED ORDER — SEVELAMER CARBONATE 800 MG PO TABS
800.0000 mg | ORAL_TABLET | Freq: Three times a day (TID) | ORAL | Status: DC
  Administered 2014-03-18 – 2014-03-28 (×18): 800 mg via ORAL
  Filled 2014-03-17 (×36): qty 1

## 2014-03-17 MED ORDER — INSULIN ASPART 100 UNIT/ML ~~LOC~~ SOLN
0.0000 [IU] | Freq: Every day | SUBCUTANEOUS | Status: DC
  Administered 2014-03-18: 3 [IU] via SUBCUTANEOUS
  Administered 2014-03-20: 2 [IU] via SUBCUTANEOUS
  Administered 2014-03-21 – 2014-03-27 (×3): 3 [IU] via SUBCUTANEOUS

## 2014-03-17 MED ORDER — PRAVASTATIN SODIUM 20 MG PO TABS
20.0000 mg | ORAL_TABLET | Freq: Every day | ORAL | Status: DC
  Administered 2014-03-18 – 2014-03-28 (×10): 20 mg via ORAL
  Filled 2014-03-17 (×12): qty 1

## 2014-03-17 MED ORDER — ACETAMINOPHEN 650 MG RE SUPP: 650.0000 mg | Freq: Four times a day (QID) | RECTAL | Status: DC | PRN

## 2014-03-17 MED ORDER — HEPARIN SODIUM (PORCINE) 5000 UNIT/ML IJ SOLN
5000.0000 [IU] | Freq: Three times a day (TID) | INTRAMUSCULAR | Status: DC
  Administered 2014-03-17 – 2014-03-26 (×24): 5000 [IU] via SUBCUTANEOUS
  Filled 2014-03-17 (×33): qty 1

## 2014-03-17 MED ORDER — PIPERACILLIN-TAZOBACTAM IN DEX 2-0.25 GM/50ML IV SOLN
2.2500 g | Freq: Three times a day (TID) | INTRAVENOUS | Status: DC
  Filled 2014-03-17: qty 50

## 2014-03-17 NOTE — H&P (Signed)
[facility] History and Physical  Patient: Elizabeth [name]  OOI:[id number]  DOB: [birth year]  DOS: the patient was seen and examined on 03/17/2014 PCP: [name], MD  Chief Complaint: black discoloration of the toe  HPI: Elizabeth [name] is a 69 y.o. female with Past medical history of  Dyslipidemia, recent CVA, peripheral vascular disease, hypertension, diabetes mellitus. the patient presented with complaints of infection of  Left foot toe. she mentions that her home health nurse came today to her house and when she checked on her left toe she felt that it looked infected and she needs to be checked at ER.  patient is unsure off the duration of the discoloration but things this could be weakness but mentions that she was seen by home health nurse last week.  She denies any fever or chills she denies any nausea or vomiting she denies any chest pain or shortness of breath oh diarrhea no burning urination.  patient also noted some blood and pus draining from the toe.  She also mentions she has been compliant with her dialysis and last dialysis session was on Saturday.  No recent change in her medications reported.  she noted some swelling of her leg which is also over last 1 week.  She has chronic neuropathy.  The patient is coming from home. And at her baseline independent for most of her ADL.  Review of Systems: as mentioned in the history of present illness.  A Comprehensive review of the other systems is negative.  Past Medical History  Diagnosis Date  . Hyperlipidemia   . Urinary retention     DX NEUROGENIC BLADDER--HAS INDWELLING FOLEY CATHETER  . Arthritis   . Anemia   . CAD (coronary artery disease)   . Neuropathic pain   . Shortness of breath     WITH EXERTION  . Stroke     patient not aware.  . Foley catheter in place     for urinary retention  . Critical lower limb ischemia   . Hypertension   . Peripheral vascular disease   . Hypoglycemia 08/19/2013  .  Hypokalemia 08/19/2013  . Chronic kidney disease     PT TOLD RECENT TESTING SHOWS KIDNEY DAMAGE--IS SEEING NEPHROLOGIST.  OFFICE NOTES OBTAINED DR. MATTINGLY -"CHRONIC KIDNEY DISEASE 4 MOST LIKELY SECONDARY TO DIABETES"  . Diabetes mellitus without complication     TAKES INSULIN    Past Surgical History  Procedure Laterality Date  . No previous surgery    . Insertion of suprapubic catheter N/A 05/16/2012    Procedure: INSERTION OF SUPRAPUBIC CATHETER;  Surgeon: Sebastian Ache, MD;  Location: [facility] ORS;  Service: Urology;  Laterality: N/A;  . Bascilic vein transposition Left 10/30/2012    Procedure: LEFT 1ST STAGE BASCILIC VEIN TRANSPOSITION;  Surgeon: Chuck Hint, MD;  Location: [facility] OR;  Service: Vascular;  Laterality: Left;  . Nm myocar perf wall motion  04/19/2012    low risk study-EF 44%,mild inferolateral hypkinesis  . Bascilic vein transposition Left 02/05/2013    Procedure: BASCILIC VEIN TRANSPOSITION 2ND STAGE;  Surgeon: Chuck Hint, MD;  Location: [facility] OR;  Service: Vascular;  Laterality: Left;  . Radiology with anesthesia N/A 10/25/2013    Procedure: RADIOLOGY WITH ANESTHESIA;  Surgeon: Medication Radiologist, MD;  Location: [facility] OR;  Service: Radiology;  Laterality: N/A;  . Fistulogram Left 04/29/2013    Procedure: FISTULOGRAM;  Surgeon: Chuck Hint, MD;  Location: [facility] CATH LAB;  Service: Cardiovascular;  Laterality: Left;  . Angioplasty Left 04/29/2013  Procedure: ANGIOPLASTY;  Surgeon: Chuck Hint, MD;  Location: [facility] CATH LAB;  Service: Cardiovascular;  Laterality: Left;   Social History:  reports that she has never smoked. She has never used smokeless tobacco. She reports that she does not drink alcohol or use illicit drugs.  No Known Allergies  Family History  Problem Relation Age of Onset  . Hyperlipidemia Mother   . Hypertension Mother   . Hodgkin's lymphoma Father   . Heart disease Sister     Prior to Admission medications   Medication Sig  Start Date End Date Taking? Authorizing Provider  carvedilol (COREG) 6.25 MG tablet Take 6.25 mg by mouth 2 (two) times daily with a meal.   Yes Historical Provider, MD  glucose blood (ONETOUCH VERIO) test strip Use as instructed to check blood sugar 4 times per day dx code E13.10 02/17/14  Yes [name], MD  insulin aspart (NOVOLOG) 100 UNIT/ML injection Inject 0-12 Units into the skin 2 (two) times daily. Sliding scale: 0-149=0; 150-199=2; 200-249=4; 250-299=6; 300-349=8; 350-399=10; 400-499=12; 450> = Call MD   Yes Historical Provider, MD  multivitamin (RENA-VIT) TABS tablet Take 1 tablet by mouth daily.   Yes Historical Provider, MD  [facility] DELICA LANCETS FINE MISC Use to check blood sugar 4 times per day 09/16/13  Yes [name], MD  oxyCODONE-acetaminophen (PERCOCET/ROXICET) 5-325 MG per tablet Take 1-2 tablets by mouth every 8 (eight) hours as needed for moderate pain or severe pain. 12/28/13  Yes Ripudeep Jenna Luo, MD  pravastatin (PRAVACHOL) 20 MG tablet Take 20 mg by mouth daily.   Yes Historical Provider, MD  sevelamer carbonate (RENVELA) 800 MG tablet Take 1 tablet (800 mg total) by mouth 3 (three) times daily with meals. 12/28/13  Yes Ripudeep Jenna Luo, MD  aspirin EC 81 MG tablet Take 81 mg by mouth daily.    Historical Provider, MD  cyproheptadine (PERIACTIN) 4 MG tablet Take 4 mg by mouth daily.    Historical Provider, MD  Nutritional Supplements (FEEDING SUPPLEMENT, NEPRO CARB STEADY,) LIQD Take 237 mLs by mouth as needed (missed meal during dialysis.). Patient taking differently: Take 237 mLs by mouth daily.  10/24/13   Belkys A Regalado, MD  Nutritional Supplements (FEEDING SUPPLEMENT, NEPRO CARB STEADY,) LIQD Take 237 mLs by mouth 3 (three) times daily between meals. 12/28/13   Ripudeep Jenna Luo, MD  promethazine (PHENERGAN) 12.5 MG tablet Take 1 tablet (12.5 mg total) by mouth every 6 (six) hours as needed for nausea or vomiting. 12/28/13   Ripudeep Jenna Luo, MD    Physical Exam: Filed Vitals:    03/17/14 1930 03/17/14 1945 03/17/14 2000 03/17/14 2017  BP: 114/72 119/73 104/75   Pulse: 105 114 117   Temp:    98.4 F (36.9 C)  TempSrc:    Oral  Resp:      SpO2: 96% 95% 99%     General: Alert, Awake and Oriented to Time, Place and Person. Appear in mild distress Eyes: PERRL ENT: Oral Mucosa clear moist. Neck: no JVD Cardiovascular: S1 and S2 Present, no Murmur, Peripheral Pulses Present Respiratory: Bilateral Air entry equal and Decreased, Clear to Auscultation, noCrackles, no wheezes Abdomen: Bowel Sound present, Soft and non tender Skin: no Rash Extremities: no Pedal edema, no calf tenderness Neurologic: Grossly no focal neuro deficit.  Labs on Admission:  CBC:  Recent Labs Lab 03/17/14 1726  WBC 11.6*  NEUTROABS 10.4*  HGB 10.4*  HCT 32.9*  MCV 82.3  PLT 177    CMP  Component Value Date/Time   NA 141 03/17/2014 1726   K 4.5 03/17/2014 1726   CL 104 03/17/2014 1726   CO2 26 03/17/2014 1726   GLUCOSE 126* 03/17/2014 1726   BUN 42* 03/17/2014 1726   CREATININE 3.63* 03/17/2014 1726   CALCIUM 9.0 03/17/2014 1726   PROT 6.4 02/18/2014 1633   ALBUMIN 2.8* 02/18/2014 1633   AST 35 02/18/2014 1633   ALT 27 02/18/2014 1633   ALKPHOS 94 02/18/2014 1633   BILITOT 0.5 02/18/2014 1633   GFRNONAA 12* 03/17/2014 1726   GFRAA 14* 03/17/2014 1726    No results for input(s): LIPASE, AMYLASE in the last 168 hours. No results for input(s): AMMONIA in the last 168 hours.  No results for input(s): CKTOTAL, CKMB, CKMBINDEX, TROPONINI in the last 168 hours. BNP (last 3 results) No results for input(s): PROBNP in the last 8760 hours.  Radiological Exams on Admission: Dg Foot Complete Left  03/17/2014   CLINICAL DATA:  Infection of second toe of left foot.  EXAM: LEFT FOOT - COMPLETE 3+ VIEW  COMPARISON:  None.  FINDINGS: Advanced vascular calcifications. Mild hallux valgus deformity with osteoarthritis of the first metatarsal phalangeal joint. Soft tissue defect  about the distal second digit. No gross osseous destruction identified. Extensive overlap of digits on the lateral view. No soft tissue gas.  IMPRESSION: Suspect soft tissue ulcer about the second digit, without gross osseous destruction. Dependent on clinical concern, consider contrast-enhanced MRI.   Electronically Signed   By: Jeronimo Greaves M.D.   On: 03/17/2014 18:52    Assessment/Plan Principal Problem:   Cellulitis of foot Active Problems:   HLD (hyperlipidemia)   Anemia of chronic disease   End stage renal disease   DM (diabetes mellitus) type 2, uncontrolled, with ketoacidosis   Neuropathy   Ulcer of toe of left foot   1. Cellulitis of foot the patient is  Presenting with complaints of left toe infection with black discoloration most likely gangrenous. She does not have any significant limitation of the ankle joint nor has any evidence of compartment syndrome. In October 14 she had ABI which were normal and had an occluded posterior TV until but patent dorsalis pedis and peritoneal. At present I would continue her on broad-spectrum antibiotics. vascular surgery is consulted who will be following up with the patient.  patient has good pulses therefore no evidence of critical limb ischemia at present.  continue close monitoring. I would obtain lower extremity Doppler to rule out any DVT.   2. ESRD on hemodialysis. Nephrology consulted for continuation of hemodialysis.  3. Diabetes mellitus. Patient will be placed on sliding scale insulin.  4. Essential hypertension. Continue carvedilol.  Advance goals of care discussion: full code   Consults: vascular surgery  DVT Prophylaxis: subcutaneous Heparin Nutrition: npo after midnight  Disposition: Admitted to inpatient in med-surge unit.  Author: Lynden Oxford, MD [facility] Pager: [phone] 03/17/2014, 8:33 PM    If 7PM-7AM, p     cv clease contact night-coverage [email or web address] Password [email or web address]

## 2014-03-17 NOTE — ED Notes (Signed)
Report attempted 

## 2014-03-17 NOTE — ED Provider Notes (Signed)
CSN: [id number]     Arrival date & time 03/17/14  1637 History   First MD Initiated Contact with Patient 03/17/14 1639     Chief Complaint  Patient presents with  . Toe Pain     (Consider location/radiation/quality/duration/timing/severity/associated sxs/prior Treatment) HPI Comments: Patient with history of DM, ESRD on hemodialysis, previous poor blood flow in foot documented ABI's 11/2012, no PAD in larger vessels - presents with left second toe and foot infection. Patient states that home health nurse noticed 2 days ago. Prior to that she had minimal aching in her foot. She has noted blood and pus draining from the toe. The toe was necrotic. No fever, nausea or vomiting. Last dialysis session was 2 days ago (Saturday) was normal. No treatments prior to arrival. The onset of this condition was acute. The course is constant. Aggravating factors: none. Alleviating factors: none.    The history is provided by the patient and medical records.    Past Medical History  Diagnosis Date  . Hyperlipidemia   . Urinary retention     DX NEUROGENIC BLADDER--HAS INDWELLING FOLEY CATHETER  . Arthritis   . Anemia   . CAD (coronary artery disease)   . Neuropathic pain   . Shortness of breath     WITH EXERTION  . Stroke     patient not aware.  . Foley catheter in place     for urinary retention  . Critical lower limb ischemia   . Hypertension   . Peripheral vascular disease   . Hypoglycemia 08/19/2013  . Hypokalemia 08/19/2013  . Chronic kidney disease     PT TOLD RECENT TESTING SHOWS KIDNEY DAMAGE--IS SEEING NEPHROLOGIST.  OFFICE NOTES OBTAINED DR. MATTINGLY -"CHRONIC KIDNEY DISEASE 4 MOST LIKELY SECONDARY TO DIABETES"  . Diabetes mellitus without complication     TAKES INSULIN    Past Surgical History  Procedure Laterality Date  . No previous surgery    . Insertion of suprapubic catheter N/A 05/16/2012    Procedure: INSERTION OF SUPRAPUBIC CATHETER;  Surgeon: Sebastian Ache, MD;  Location:  [facility] ORS;  Service: Urology;  Laterality: N/A;  . Bascilic vein transposition Left 10/30/2012    Procedure: LEFT 1ST STAGE BASCILIC VEIN TRANSPOSITION;  Surgeon: Chuck Hint, MD;  Location: [facility] OR;  Service: Vascular;  Laterality: Left;  . Nm myocar perf wall motion  04/19/2012    low risk study-EF 44%,mild inferolateral hypkinesis  . Bascilic vein transposition Left 02/05/2013    Procedure: BASCILIC VEIN TRANSPOSITION 2ND STAGE;  Surgeon: Chuck Hint, MD;  Location: [facility] OR;  Service: Vascular;  Laterality: Left;  . Radiology with anesthesia N/A 10/25/2013    Procedure: RADIOLOGY WITH ANESTHESIA;  Surgeon: Medication Radiologist, MD;  Location: [facility] OR;  Service: Radiology;  Laterality: N/A;  . Fistulogram Left 04/29/2013    Procedure: FISTULOGRAM;  Surgeon: Chuck Hint, MD;  Location: [facility] CATH LAB;  Service: Cardiovascular;  Laterality: Left;  . Angioplasty Left 04/29/2013    Procedure: ANGIOPLASTY;  Surgeon: Chuck Hint, MD;  Location: [facility] CATH LAB;  Service: Cardiovascular;  Laterality: Left;   Family History  Problem Relation Age of Onset  . Hyperlipidemia Mother   . Hypertension Mother   . Hodgkin's lymphoma Father   . Heart disease Sister    History  Substance Use Topics  . Smoking status: Never Smoker   . Smokeless tobacco: Never Used  . Alcohol Use: No   OB History    No data available  Review of Systems  Constitutional: Negative for fever.  HENT: Negative for rhinorrhea and sore throat.   Eyes: Negative for redness.  Respiratory: Negative for cough.   Cardiovascular: Negative for chest pain.  Gastrointestinal: Negative for nausea, vomiting, abdominal pain and diarrhea.  Genitourinary: Negative for dysuria.  Musculoskeletal: Positive for myalgias.  Skin: Positive for color change. Negative for rash.  Neurological: Negative for headaches.      Allergies  Review of patient's allergies indicates no known allergies.  Home Medications    Prior to Admission medications   Medication Sig Start Date End Date Taking? Authorizing Provider  aspirin EC 81 MG tablet Take 81 mg by mouth daily.    Historical Provider, MD  carvedilol (COREG) 25 MG tablet Take 25 mg by mouth 2 (two) times daily with a meal.    Historical Provider, MD  glucose blood (ONETOUCH VERIO) test strip Use as instructed to check blood sugar 4 times per day dx code E13.10 02/17/14   [name], MD  insulin aspart (NOVOLOG) 100 UNIT/ML injection Inject 0-12 Units into the skin 2 (two) times daily. Sliding scale: 0-149=0; 150-199=2; 200-249=4; 250-299=6; 300-349=8; 350-399=10; 400-499=12; 450> = Call MD    Historical Provider, MD  multivitamin (RENA-VIT) TABS tablet Take 1 tablet by mouth daily.    Historical Provider, MD  Nutritional Supplements (FEEDING SUPPLEMENT, NEPRO CARB STEADY,) LIQD Take 237 mLs by mouth as needed (missed meal during dialysis.). Patient taking differently: Take 237 mLs by mouth daily.  10/24/13   Belkys A Regalado, MD  Nutritional Supplements (FEEDING SUPPLEMENT, NEPRO CARB STEADY,) LIQD Take 237 mLs by mouth 3 (three) times daily between meals. 12/28/13   Ripudeep Jenna Luo, MD  ONETOUCH DELICA LANCETS FINE MISC Use to check blood sugar 4 times per day 09/16/13   [name], MD  oxyCODONE-acetaminophen (PERCOCET/ROXICET) 5-325 MG per tablet Take 1-2 tablets by mouth every 8 (eight) hours as needed for moderate pain or severe pain. 12/28/13   Ripudeep Jenna Luo, MD  pravastatin (PRAVACHOL) 20 MG tablet Take 20 mg by mouth at bedtime.     Historical Provider, MD  promethazine (PHENERGAN) 12.5 MG tablet Take 1 tablet (12.5 mg total) by mouth every 6 (six) hours as needed for nausea or vomiting. 12/28/13   Ripudeep Jenna Luo, MD  sevelamer carbonate (RENVELA) 800 MG tablet Take 1 tablet (800 mg total) by mouth 3 (three) times daily with meals. 12/28/13   Ripudeep K Rai, MD   BP 121/74 mmHg  Pulse 90  Temp(Src) 98.8 F (37.1 C) (Oral)  Resp 16  SpO2 97%    Physical Exam  Constitutional: She appears well-developed and well-nourished.  HENT:  Head: Normocephalic and atraumatic.  Eyes: Conjunctivae are normal. Right eye exhibits no discharge. Left eye exhibits no discharge.  Neck: Normal range of motion. Neck supple.  Cardiovascular: Normal rate, regular rhythm and normal heart sounds.   Pulmonary/Chest: Effort normal and breath sounds normal.  Abdominal: Soft. There is no tenderness.  Musculoskeletal: She exhibits edema. She exhibits no tenderness.  1+ pitting edema bilaterally to mid-calves. 2nd toe is necrotic. There is serosanguineous discharge from the toe. Minimal purulent drainage. There is cellulitis of remaining toes and forefoot.  Neurological: She is alert.  Skin: Skin is warm and dry.  Psychiatric: She has a normal mood and affect.  Nursing note and vitals reviewed.        ED Course  Procedures (including critical care time) Labs Review Labs Reviewed  CBC WITH DIFFERENTIAL/PLATELET - Abnormal;  Notable for the following:    WBC 11.6 (*)    Hemoglobin 10.4 (*)    HCT 32.9 (*)    RDW 17.4 (*)    Neutrophils Relative % 90 (*)    Neutro Abs 10.4 (*)    Lymphocytes Relative 5 (*)    Lymphs Abs 0.6 (*)    All other components within normal limits  BASIC METABOLIC PANEL - Abnormal; Notable for the following:    Glucose, Bld 126 (*)    BUN 42 (*)    Creatinine, Ser 3.63 (*)    GFR calc non Af Amer 12 (*)    GFR calc Af Amer 14 (*)    All other components within normal limits  CULTURE, BLOOD (SINGLE)  I-STAT CG4 LACTIC ACID, ED    Imaging Review Dg Foot Complete Left  03/17/2014   CLINICAL DATA:  Infection of second toe of left foot.  EXAM: LEFT FOOT - COMPLETE 3+ VIEW  COMPARISON:  None.  FINDINGS: Advanced vascular calcifications. Mild hallux valgus deformity with osteoarthritis of the first metatarsal phalangeal joint. Soft tissue defect about the distal second digit. No gross osseous destruction identified.  Extensive overlap of digits on the lateral view. No soft tissue gas.  IMPRESSION: Suspect soft tissue ulcer about the second digit, without gross osseous destruction. Dependent on clinical concern, consider contrast-enhanced MRI.   Electronically Signed   By: Jeronimo Greaves M.D.   On: 03/17/2014 18:52     EKG Interpretation None      5:01 PM Patient seen and examined. Work-up initiated. Medications ordered. Patient discussed with and seen by Dr. Loretha Stapler. Will need admission.   Vital signs reviewed and are as follows: BP 121/74 mmHg  Pulse 90  Temp(Src) 98.8 F (37.1 C) (Oral)  Resp 16  SpO2 97%  7:45 PM Patient discussed with Dr. Allena Katz of [facility] who will see.   MDM   Final diagnoses:  Toe infection  Cellulitis of foot  Necrotic toes   Admit.    Renne Crigler, PA-C 03/17/14 1946  Merrie Roof, MD 03/17/14 2004

## 2014-03-17 NOTE — ED Provider Notes (Signed)
Medical screening examination/treatment/procedure(s) were conducted as a shared visit with non-physician practitioner(s) and myself.  I personally evaluated the patient during the encounter.   EKG Interpretation None      69 yo female who presents with left foot 2nd toe wound which has been worsening over past week.  On exam, well appearing, nontoxic, not distressed, normal respiratory effort, normal perfusion, left 2nd toe is necrotic with spreading erythema up her foot to her ankle.        She will need labs, antibiotics, and admission.   Clinical Impression: 1. Cellulitis of foot   2. Toe infection   3. Necrotic toes       Candyce Churn III, MD 03/17/14 2006

## 2014-03-17 NOTE — Progress Notes (Addendum)
ANTIBIOTIC CONSULT NOTE - INITIAL  Pharmacy Consult for Vancomycin & Zosyn Indication: Cellulitis  No Known Allergies  Patient Measurements:   Vital Signs: Temp: 98.8 F (37.1 C) (01/25 1716) Temp Source: Oral (01/25 1716) BP: 104/75 mmHg (01/25 2000) Pulse Rate: 117 (01/25 2000) Intake/Output from previous day:   Intake/Output from this shift:    Labs:  Recent Labs  03/17/14 1726  WBC 11.6*  HGB 10.4*  PLT 177  CREATININE 3.63*   CrCl cannot be calculated (Unknown ideal weight.). No results for input(s): VANCOTROUGH, VANCOPEAK, VANCORANDOM, GENTTROUGH, GENTPEAK, GENTRANDOM, TOBRATROUGH, TOBRAPEAK, TOBRARND, AMIKACINPEAK, AMIKACINTROU, AMIKACIN in the last 72 hours.   Microbiology: No results found for this or any previous visit (from the past 720 hour(s)).  Medical History: Past Medical History  Diagnosis Date  . Hyperlipidemia   . Urinary retention     DX NEUROGENIC BLADDER--HAS INDWELLING FOLEY CATHETER  . Arthritis   . Anemia   . CAD (coronary artery disease)   . Neuropathic pain   . Shortness of breath     WITH EXERTION  . Stroke     patient not aware.  . Foley catheter in place     for urinary retention  . Critical lower limb ischemia   . Hypertension   . Peripheral vascular disease   . Hypoglycemia 08/19/2013  . Hypokalemia 08/19/2013  . Chronic kidney disease     PT TOLD RECENT TESTING SHOWS KIDNEY DAMAGE--IS SEEING NEPHROLOGIST.  OFFICE NOTES OBTAINED DR. MATTINGLY -"CHRONIC KIDNEY DISEASE 4 MOST LIKELY SECONDARY TO DIABETES"  . Diabetes mellitus without complication     TAKES INSULIN     Medications:  Prescriptions prior to admission  Medication Sig Dispense Refill Last Dose  . carvedilol (COREG) 6.25 MG tablet Take 6.25 mg by mouth 2 (two) times daily with a meal.   03/17/2014 at 915a  . glucose blood (ONETOUCH VERIO) test strip Use as instructed to check blood sugar 4 times per day dx code E13.10 150 each 3 Past Month at Unknown time  .  insulin aspart (NOVOLOG) 100 UNIT/ML injection Inject 0-12 Units into the skin 2 (two) times daily. Sliding scale: 0-149=0; 150-199=2; 200-249=4; 250-299=6; 300-349=8; 350-399=10; 400-499=12; 450> = Call MD   03/17/2014 at Unknown time  . multivitamin (RENA-VIT) TABS tablet Take 1 tablet by mouth daily.   03/16/2014 at Unknown time  . ONETOUCH DELICA LANCETS FINE MISC Use to check blood sugar 4 times per day 200 each 3 Past Month at Unknown time  . oxyCODONE-acetaminophen (PERCOCET/ROXICET) 5-325 MG per tablet Take 1-2 tablets by mouth every 8 (eight) hours as needed for moderate pain or severe pain. 30 tablet 0 03/16/2014 at Unknown time  . pravastatin (PRAVACHOL) 20 MG tablet Take 20 mg by mouth daily.     . sevelamer carbonate (RENVELA) 800 MG tablet Take 1 tablet (800 mg total) by mouth 3 (three) times daily with meals.   03/17/2014 at Unknown time  . aspirin EC 81 MG tablet Take 81 mg by mouth daily.     . cyproheptadine (PERIACTIN) 4 MG tablet Take 4 mg by mouth daily.     . Nutritional Supplements (FEEDING SUPPLEMENT, NEPRO CARB STEADY,) LIQD Take 237 mLs by mouth as needed (missed meal during dialysis.). (Patient taking differently: Take 237 mLs by mouth daily. ) 30 Can 0 Taking  . Nutritional Supplements (FEEDING SUPPLEMENT, NEPRO CARB STEADY,) LIQD Take 237 mLs by mouth 3 (three) times daily between meals.  0 Taking  . promethazine (PHENERGAN) 12.5  MG tablet Take 1 tablet (12.5 mg total) by mouth every 6 (six) hours as needed for nausea or vomiting. 30 tablet 0 Taking   Scheduled:  . aspirin EC  81 mg Oral Daily  . [START ON 03/18/2014] carvedilol  6.25 mg Oral BID WC  . heparin  5,000 Units Subcutaneous 3 times per day  . [START ON 03/18/2014] insulin aspart  0-15 Units Subcutaneous TID WC  . insulin aspart  0-5 Units Subcutaneous QHS  . piperacillin-tazobactam (ZOSYN)  IV  2.25 g Intravenous 3 times per day  . pravastatin  20 mg Oral Daily  . [START ON 03/18/2014] sevelamer carbonate  800 mg  Oral TID WC  . [START ON 03/18/2014] vancomycin  500 mg Intravenous Q T,Th,Sa-HD   Infusions:   Assessment:  54 YOF w/ PMH of DM, ESRD on hemodialysis  T,Th,Sat, previous poor blood flow in foot documented ABI's 11/2012, no PAD in larger vessels - presents with left second toe and foot infection. Patient states that home health nurse noticed 2 days ago.  Infection is necrotic at the toes.  Pharmacy has been consulted to dose vancomycin and zosyn for cellulitis.    Given the severity of the infection, will consider giving a loading dose of vancomycin.  Patient will be receive remaining doses at dialysis.    Patient received vanc and zosyn in November for UTI, transitioned to cipro in 12/2013.    WBC up 11.6, afeb, HR tachy 117, RR wnl.    Goal of Therapy:  Target pre-HD level 15 - 25 mcg/mL Target post-HD level 5 - 15 mcg/mL  Plan:  - Vancomycin 750 mg x 1 LD - Vancomycin 500 mg QHD Tues, Thurs, Sat - Zosyn 2.25 g IV Q8H - Monitor for clinical efficacy and random trough levels when appropriate - Follow up cultures  Red Christians, Pharm. D. Clinical Pharmacy Resident Pager: [phone] Ph: [phone] 03/17/2014 9:04 PM

## 2014-03-17 NOTE — ED Notes (Signed)
Pt arrived from home by [facility] with c/o infection to 2nd toe on left foot. Looks like gangrene. Infection spreads to 3rd toe and up foot. Pt is dialysis pt and has home health nurse that comes to her apartment. BP-106/60 HR-90 CBG-128

## 2014-03-18 ENCOUNTER — Encounter (HOSPITAL_COMMUNITY): Payer: Self-pay | Admitting: General Practice

## 2014-03-18 DIAGNOSIS — G629 Polyneuropathy, unspecified: Secondary | ICD-10-CM

## 2014-03-18 DIAGNOSIS — M7989 Other specified soft tissue disorders: Secondary | ICD-10-CM

## 2014-03-18 DIAGNOSIS — M79609 Pain in unspecified limb: Secondary | ICD-10-CM

## 2014-03-18 DIAGNOSIS — L97523 Non-pressure chronic ulcer of other part of left foot with necrosis of muscle: Secondary | ICD-10-CM

## 2014-03-18 DIAGNOSIS — I96 Gangrene, not elsewhere classified: Secondary | ICD-10-CM

## 2014-03-18 LAB — CBC
HCT: 28 % — ABNORMAL LOW (ref 36.0–46.0)
HEMOGLOBIN: 8.8 g/dL — AB (ref 12.0–15.0)
MCH: 26.3 pg (ref 26.0–34.0)
MCHC: 31.4 g/dL (ref 30.0–36.0)
MCV: 83.6 fL (ref 78.0–100.0)
PLATELETS: 171 10*3/uL (ref 150–400)
RBC: 3.35 MIL/uL — AB (ref 3.87–5.11)
RDW: 17.6 % — ABNORMAL HIGH (ref 11.5–15.5)
WBC: 8.6 10*3/uL (ref 4.0–10.5)

## 2014-03-18 LAB — COMPREHENSIVE METABOLIC PANEL
ALK PHOS: 113 U/L (ref 39–117)
ALT: 35 U/L (ref 0–35)
ANION GAP: 11 (ref 5–15)
AST: 21 U/L (ref 0–37)
Albumin: 2.1 g/dL — ABNORMAL LOW (ref 3.5–5.2)
BILIRUBIN TOTAL: 0.5 mg/dL (ref 0.3–1.2)
BUN: 48 mg/dL — ABNORMAL HIGH (ref 6–23)
CHLORIDE: 102 mmol/L (ref 96–112)
CO2: 27 mmol/L (ref 19–32)
Calcium: 8.6 mg/dL (ref 8.4–10.5)
Creatinine, Ser: 4.15 mg/dL — ABNORMAL HIGH (ref 0.50–1.10)
GFR calc Af Amer: 12 mL/min — ABNORMAL LOW (ref >90)
GFR calc non Af Amer: 10 mL/min — ABNORMAL LOW (ref >90)
GLUCOSE: 173 mg/dL — AB (ref 70–99)
Potassium: 4.3 mmol/L (ref 3.5–5.1)
SODIUM: 140 mmol/L (ref 135–145)
TOTAL PROTEIN: 6.1 g/dL (ref 6.0–8.3)

## 2014-03-18 LAB — C-REACTIVE PROTEIN: CRP: 12 mg/dL — ABNORMAL HIGH (ref <0.60)

## 2014-03-18 LAB — GLUCOSE, CAPILLARY
GLUCOSE-CAPILLARY: 186 mg/dL — AB (ref 70–99)
GLUCOSE-CAPILLARY: 288 mg/dL — AB (ref 70–99)
Glucose-Capillary: 164 mg/dL — ABNORMAL HIGH (ref 70–99)
Glucose-Capillary: 176 mg/dL — ABNORMAL HIGH (ref 70–99)
Glucose-Capillary: 291 mg/dL — ABNORMAL HIGH (ref 70–99)

## 2014-03-18 LAB — PROTIME-INR
INR: 1.07 (ref 0.00–1.49)
Prothrombin Time: 14.1 seconds (ref 11.6–15.2)

## 2014-03-18 LAB — CG4 I-STAT (LACTIC ACID): LACTIC ACID, VENOUS: 0.96 mmol/L (ref 0.5–2.0)

## 2014-03-18 LAB — HEPATITIS B SURFACE ANTIGEN: Hepatitis B Surface Ag: NEGATIVE

## 2014-03-18 MED ORDER — DARBEPOETIN ALFA 60 MCG/0.3ML IJ SOSY
60.0000 ug | PREFILLED_SYRINGE | INTRAMUSCULAR | Status: DC
  Filled 2014-03-18: qty 0.3

## 2014-03-18 MED ORDER — SODIUM CHLORIDE 0.9 % IV SOLN
250.0000 mg | INTRAVENOUS | Status: DC
  Administered 2014-03-18 – 2014-03-23 (×3): 250 mg via INTRAVENOUS
  Filled 2014-03-18 (×6): qty 20

## 2014-03-18 MED ORDER — DARBEPOETIN ALFA 60 MCG/0.3ML IJ SOSY
PREFILLED_SYRINGE | INTRAMUSCULAR | Status: AC
  Filled 2014-03-18: qty 0.3

## 2014-03-18 MED ORDER — DARBEPOETIN ALFA 200 MCG/0.4ML IJ SOSY
200.0000 ug | PREFILLED_SYRINGE | INTRAMUSCULAR | Status: DC
  Administered 2014-03-18 – 2014-03-25 (×2): 200 ug via INTRAVENOUS
  Filled 2014-03-18: qty 0.4

## 2014-03-18 MED ORDER — NEPRO/CARBSTEADY PO LIQD
237.0000 mL | Freq: Three times a day (TID) | ORAL | Status: DC
  Administered 2014-03-18 – 2014-03-22 (×8): 237 mL via ORAL
  Filled 2014-03-18 (×13): qty 237

## 2014-03-18 MED ORDER — DARBEPOETIN ALFA 200 MCG/0.4ML IJ SOSY
PREFILLED_SYRINGE | INTRAMUSCULAR | Status: AC
  Administered 2014-03-18: 200 ug via INTRAVENOUS
  Filled 2014-03-18: qty 0.4

## 2014-03-18 NOTE — Progress Notes (Signed)
[facility] PROGRESS NOTE   Elizabeth [name] WJX:[id number] DOB: September 01, 1945 DOA: 03/17/2014 PCP: Elizabeth Littler, MD  HPI: Elizabeth [name] is a 69 year old African American female with past medical history ofdyslipidemia, recent CVA, peripheral vascular disease, hypertension, diabetes mellitus. The patient presented with complaints of infection of Left foot toe. She mentions that her home health nurse came to her house 03/17/2014 and when she checked on her left toe she felt that it looked infected and she needs to be checked at ER. Patient is unsure off the duration of the discoloration but thinks this could be weakness but mentions that she was seen by home health nurse last week. She denies any fever or chills she denies any nausea or vomiting she denies any chest pain or shortness of breath oh diarrhea no burning urination. Patient also noted some blood and pus draining from the toe. She also mentions she has been compliant with her dialysis and last dialysis session was on Saturday. No recent change in her medications reported. She noted some swelling of her leg which is also over last 1 week. She has chronic neuropathy. The patient is coming from home. And at her baseline independent for most of her ADL.  Subjective: She slept ok last night except for worrying about her toe. She seems tired and somewhat uninterested in interacting with me, though she was cooperative. The left foot second toe is painful and the pain radiates to the ankle.  Assessment/Plan: Principal Problem:   Cellulitis of foot Active Problems:   HLD (hyperlipidemia)   Anemia of chronic disease   End stage renal disease   DM (diabetes mellitus) type 2, uncontrolled, with ketoacidosis   Neuropathy   Ulcer of toe of left foot   Cellulitis of Left foot/Ulcer of Toe of Left Foot -Likely due to peripheral vascular disease and diabetes history. -Xray of left foot showed "Suspect soft tissue ulcer about the second  digit, without gross osseous destruction." -MRI w/o contrast of left foot showed "Devitalization of the middle phalanx of the second toe. Abnormal signal from the proximal phalanx of the second toe is most likely due being devitalized. There is no discrete destruction of the proximal phalanx. Adjacent cellulitis. The distal phalanx of the second toe is missing." -Bilateral lower extremity venous duplex showed no evidence of DVT, superficial thrombosis, or Baker's cyst. -Continue vancomycin and zosyn. -Appreciate vascular surgery's input. Arteriogram scheduled for tomorrow, to not interfere with dialysis schedule. -Take off of NPO for today; nothing by mouth after midnight.  DM Type 2, uncontrolled, with ketoacidosis Continue NovoLOG on sliding scale.  End stage renal disease -Likely due to diabetes -Patient reports being compliant with dialysis on a T/Th/Sat schedule. Her last dialysis session was Saturday 03/15/2014. -BUN 48, Cr 4.15. -Continue Renvela. -Consult nephrology to continue dialysis in the hospital.  Hyperlipidemia -Continue pravastatin.   Hypertension -Continue carvedilol.  Anemia of Chronic disease -Hgb 8.8 -Recheck in the morning.   Code Status: Full Code Family Communication: Plan discussed with the patient. Disposition Plan: Remains inpatient Diet: Diet NPO time specified Except for: Sips with Meds Diet renal/carb modified with 1200 ml fluid restriction  Consultants:  Vascular surgery  Procedures:  Arteriogram 03/19/2014  Antibiotics:  Vancomycin 1/25>>  Zosyn 1/25>>   Objective: Filed Vitals:   03/18/14 0552  BP: 106/62  Pulse: 92  Temp: 99 F (37.2 C)  Resp: 20    Intake/Output Summary (Last 24 hours) at 03/18/14 1131 Last data filed at 03/18/14 0900  Gross per  24 hour  Intake    100 ml  Output      0 ml  Net    100 ml   Filed Weights   03/17/14 2106  Weight: 36.8 kg (81 lb 2.1 oz)    Exam: General: Alert and awake, oriented  x3, not in any acute distress, appears tired; appears very thin and undernourished. HEENT: anicteric sclera; pupils reactive to light and accommodation; no eye nasal or ear discharge; right side of neck is tender to palpation  CVS: S1-S2 clear, possible murmur and gallop Chest: clear to auscultation bilaterally, no wheezing, rales or rhonchi;  Abdomen: soft, nontender, nondistended, quiet bowel sounds, scar in hypogastric region (pt says from suprapubic catheter) Extremities: Strength 5/5 upper and lower extremities bilaterally. No edema noted bilaterally.  Left foot second toe is black and necrotic; left foot is tender to light palpation. Pedal pulses difficult to palpate. Skin: Many healed scars on legs. Neuro: Pedal light sensation intact bilaterally.  Data Reviewed: Basic Metabolic Panel:  Recent Labs Lab 03/17/14 1726 03/18/14 0600  NA 141 140  K 4.5 4.3  CL 104 102  CO2 26 27  GLUCOSE 126* 173*  BUN 42* 48*  CREATININE 3.63* 4.15*  CALCIUM 9.0 8.6   Liver Function Tests:  Recent Labs Lab 03/18/14 0600  AST 21  ALT 35  ALKPHOS 113  BILITOT 0.5  PROT 6.1  ALBUMIN 2.1*   No results for input(s): LIPASE, AMYLASE in the last 168 hours. No results for input(s): AMMONIA in the last 168 hours. CBC:  Recent Labs Lab 03/17/14 1726 03/18/14 0600  WBC 11.6* 8.6  NEUTROABS 10.4*  --   HGB 10.4* 8.8*  HCT 32.9* 28.0*  MCV 82.3 83.6  PLT 177 171   Cardiac Enzymes: No results for input(s): CKTOTAL, CKMB, CKMBINDEX, TROPONINI in the last 168 hours. BNP (last 3 results) No results for input(s): PROBNP in the last 8760 hours. CBG:  Recent Labs Lab 03/17/14 2234 03/18/14 0750  GLUCAP 225* 164*    Micro No results found for this or any previous visit (from the past 240 hour(s)).   Studies: Mr Foot Left Wo Contrast  03/18/2014   CLINICAL DATA:  Cellulitis of the left foot.  EXAM: MRI OF THE LEFT FOREFOOT WITHOUT CONTRAST  TECHNIQUE: Multiplanar, multisequence MR  imaging was performed. No intravenous contrast was administered.  COMPARISON:  Radiographs dated 03/17/2014  FINDINGS: There is diffuse abnormal signal from the proximal phalanx of the second toe. The middle phalanx is exposed and devitalized with very little signal. The distal phalanx is missing. There is a large soft tissue defect at the stump of the second toe.  There is abnormal subcutaneous edema at the base of the second and third toes particularly along the plantar aspect and extending in the web space between the first and second toes. No discrete abscess. There is no osteomyelitis of the other toes.  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  IMPRESSION: Devitalization of the middle phalanx of the second toe. Abnormal signal from the proximal phalanx of the second toe is most likely due being devitalized. There is no discrete destruction of the proximal phalanx.  Adjacent cellulitis. The distal phalanx of the second toe is missing. Middle phalanx is devitalized.   Electronically Signed   By: Geanie Cooley M.D.   On: 03/18/2014 07:49   Dg Foot Complete Left  03/17/2014   CLINICAL DATA:  Infection of second toe of left foot.  EXAM:  LEFT FOOT - COMPLETE 3+ VIEW  COMPARISON:  None.  FINDINGS: Advanced vascular calcifications. Mild hallux valgus deformity with osteoarthritis of the first metatarsal phalangeal joint. Soft tissue defect about the distal second digit. No gross osseous destruction identified. Extensive overlap of digits on the lateral view. No soft tissue gas.  IMPRESSION: Suspect soft tissue ulcer about the second digit, without gross osseous destruction. Dependent on clinical concern, consider contrast-enhanced MRI.   Electronically Signed   By: Jeronimo Greaves M.D.   On: 03/17/2014 18:52    Scheduled Meds: . aspirin EC  81 mg Oral Daily  . carvedilol  6.25 mg Oral BID WC  . feeding supplement (NEPRO CARB STEADY)  237 mL Oral TID WC  . heparin  5,000 Units  Subcutaneous 3 times per day  . insulin aspart  0-15 Units Subcutaneous TID WC  . insulin aspart  0-5 Units Subcutaneous QHS  . piperacillin-tazobactam (ZOSYN)  IV  2.25 g Intravenous 3 times per day  . pravastatin  20 mg Oral q1800  . sevelamer carbonate  800 mg Oral TID WC  . vancomycin  500 mg Intravenous Q T,Th,Sa-HD   Continuous Infusions:      Time spent: 35 minutes    Jamison Neighbor, PA-S  [facility] Pager [phone] If 7PM-7AM, please contact night-coverage at [email or web address], password [facility] 03/18/2014, 11:31 AM  LOS: 1 day    Addendum  Patient seen and examined, chart and data base reviewed.  I agree with the above assessment and plan.  For full details please see Mrs. Jamison Neighbor, PA-S note.  I reviewed and amended the above note as appropriate.  69 year old came in with necrotic second left toe, seen by vascular, scheduled for arteriogram in the morning.   Clint Lipps, MD [facility] Pager: [phone] 03/18/2014, 5:33 PM

## 2014-03-18 NOTE — Procedures (Signed)
I was present at this session.  I have reviewed the session itself and made appropriate changes.  Seen on HD, cath flow 400.  bp low 100s.    Cecil Bixby L 1/26/20163:22 PM

## 2014-03-18 NOTE — Consult Note (Signed)
[facility] [place] HISTORY AND PHYSICAL  Reason for consult: left foot wound Requesting: Hospitalist service History of Present Illness:  Patient is a 69 y.o. year old female who presents for evaluation of non healing wound left foot.  Pt states that she developed a wound on her left foot about two weeks ago.  She was being followed by Dr Jimmey Ralph at the wound center.  Things slowly progressed to cellulitis. She complains of some pain in the foot but not bad.  She does not really describe claudication.  She did have a non invasive arterial exam about 15 months ago which showed normal ABI but some tibial disease.  She is on dialysis T Th Sat.  Other medical problems include hyperlipid, dyspnea, hypertension, diabetes all currently stable.  She is currently on Vanc/Zosyn for cellulitis.   Past Medical History  Diagnosis Date  . Hyperlipidemia   . Urinary retention     DX NEUROGENIC BLADDER--HAS INDWELLING FOLEY CATHETER  . Arthritis   . Anemia   . CAD (coronary artery disease)   . Neuropathic pain   . Shortness of breath     WITH EXERTION  . Stroke     patient not aware.  . Foley catheter in place     for urinary retention  . Critical lower limb ischemia   . Hypertension   . Peripheral vascular disease   . Hypoglycemia 08/19/2013  . Hypokalemia 08/19/2013  . Chronic kidney disease     PT TOLD RECENT TESTING SHOWS KIDNEY DAMAGE--IS SEEING NEPHROLOGIST.  OFFICE NOTES OBTAINED DR. MATTINGLY -"CHRONIC KIDNEY DISEASE 4 MOST LIKELY SECONDARY TO DIABETES"  . Diabetes mellitus without complication     TAKES INSULIN     Past Surgical History  Procedure Laterality Date  . No previous surgery    . Insertion of suprapubic catheter N/A 05/16/2012    Procedure: INSERTION OF SUPRAPUBIC CATHETER;  Surgeon: Sebastian Ache, MD;  Location: [facility] ORS;  Service: Urology;  Laterality: N/A;  . Bascilic vein transposition Left 10/30/2012    Procedure: LEFT 1ST STAGE BASCILIC VEIN  TRANSPOSITION;  Surgeon: Chuck Hint, MD;  Location: [facility] OR;  Service: Vascular;  Laterality: Left;  . Nm myocar perf wall motion  04/19/2012    low risk study-EF 44%,mild inferolateral hypkinesis  . Bascilic vein transposition Left 02/05/2013    Procedure: BASCILIC VEIN TRANSPOSITION 2ND STAGE;  Surgeon: Chuck Hint, MD;  Location: [facility] OR;  Service: Vascular;  Laterality: Left;  . Radiology with anesthesia N/A 10/25/2013    Procedure: RADIOLOGY WITH ANESTHESIA;  Surgeon: Medication Radiologist, MD;  Location: [facility] OR;  Service: Radiology;  Laterality: N/A;  . Fistulogram Left 04/29/2013    Procedure: FISTULOGRAM;  Surgeon: Chuck Hint, MD;  Location: [facility] CATH LAB;  Service: Cardiovascular;  Laterality: Left;  . Angioplasty Left 04/29/2013    Procedure: ANGIOPLASTY;  Surgeon: Chuck Hint, MD;  Location: [facility] CATH LAB;  Service: Cardiovascular;  Laterality: Left;    Social History History  Substance Use Topics  . Smoking status: Never Smoker   . Smokeless tobacco: Never Used  . Alcohol Use: No    Family History Family History  Problem Relation Age of Onset  . Hyperlipidemia Mother   . Hypertension Mother   . Hodgkin's lymphoma Father   . Heart disease Sister     Allergies  No Known Allergies   Current Facility-Administered Medications  Medication Dose Route Frequency Provider Last Rate Last Dose  . acetaminophen (TYLENOL) tablet  650 mg  650 mg Oral Q6H PRN Lynden Oxford, MD       Or  . acetaminophen (TYLENOL) suppository 650 mg  650 mg Rectal Q6H PRN Lynden Oxford, MD      . aspirin EC tablet 81 mg  81 mg Oral Daily Lynden Oxford, MD   81 mg at 03/17/14 2303  . carvedilol (COREG) tablet 6.25 mg  6.25 mg Oral BID WC Lynden Oxford, MD   6.25 mg at 03/18/14 0800  . heparin injection 5,000 Units  5,000 Units Subcutaneous 3 times per day Lynden Oxford, MD   5,000 Units at 03/18/14 0600  . insulin aspart (novoLOG) injection 0-15 Units  0-15 Units Subcutaneous  TID WC Lynden Oxford, MD      . insulin aspart (novoLOG) injection 0-5 Units  0-5 Units Subcutaneous QHS Lynden Oxford, MD   0 Units at 03/17/14 2250  . ondansetron (ZOFRAN) tablet 4 mg  4 mg Oral Q6H PRN Lynden Oxford, MD       Or  . ondansetron (ZOFRAN) injection 4 mg  4 mg Intravenous Q6H PRN Lynden Oxford, MD      . oxyCODONE-acetaminophen (PERCOCET/ROXICET) 5-325 MG per tablet 1-2 tablet  1-2 tablet Oral Q8H PRN Lynden Oxford, MD   1 tablet at 03/17/14 2249  . piperacillin-tazobactam (ZOSYN) IVPB 2.25 g  2.25 g Intravenous 3 times per day Gardner Candle, RPH   2.25 g at 03/18/14 0600  . pravastatin (PRAVACHOL) tablet 20 mg  20 mg Oral q1800 Lynden Oxford, MD      . sevelamer carbonate (RENVELA) tablet 800 mg  800 mg Oral TID WC Lynden Oxford, MD   800 mg at 03/18/14 0800  . vancomycin (VANCOCIN) 500 mg in sodium chloride 0.9 % 100 mL IVPB  500 mg Intravenous Q T,Th,Sa-HD Wilhemina Bonito, RPH        ROS:   General:  No weight loss, Fever, chills  HEENT: No recent headaches, no nasal bleeding, no visual changes, no sore throat  Neurologic: No dizziness, blackouts, seizures. No recent symptoms of stroke or mini- stroke. No recent episodes of slurred speech, or temporary blindness.  Cardiac: No recent episodes of chest pain/pressure, no shortness of breath at rest.  + shortness of breath with exertion.  Denies history of atrial fibrillation or irregular heartbeat  Vascular: No history of rest pain in feet.  No history of claudication.  + history of non-healing ulcer, No history of DVT   Pulmonary: No home oxygen, no productive cough, no hemoptysis,  No asthma or wheezing  Musculoskeletal:   Arthritis,  Low back pain,   Joint pain  Hematologic:No history of hypercoagulable state.  No history of easy bleeding.  + history of anemia  Gastrointestinal: No hematochezia or melena,  No gastroesophageal reflux, no trouble swallowing  Urinary: [ x] chronic Kidney disease, [x ] on HD -  MWF  or [x ] TTHS,  Burning with urination,  Frequent urination,  Difficulty urinating;   Skin: No rashes  Psychological: No history of anxiety,  No history of depression   Physical Examination  Filed Vitals:   03/17/14 2000 03/17/14 2017 03/17/14 2106 03/18/14 0552  BP: 104/75  121/68 106/62  Pulse: 117  92 92  Temp:  98.4 F (36.9 C) 98.5 F (36.9 C) 99 F (37.2 C)  TempSrc:  Oral Oral Oral  Resp:   18 20  Height:    (1.6 m)   Weight:  81 lb 2.1 oz (36.8 kg)   SpO2: 99%  97% 95%    Body mass index is 14.38 kg/(m^2).  General:  Alert and oriented, no acute distress HEENT: Normal Neck: No JVD Pulmonary: Clear to auscultation bilaterally, right side dialysis catheter Cardiac: Regular Rate and Rhythm Abdomen: Soft, non-tender, non-distended Skin: No rash, dry gangrene left 2nd toe, no erythema but left leg warmer than right Extremity Pulses:  2+ radial, brachial, femoral, popliteal absent dorsalis pedis, posterior tibial pulses bilaterally, weak thrill left upper arm fistula Musculoskeletal: No deformity or edema  Neurologic: Upper and lower extremity motor 5/5 and symmetric  DATA:  CBC    Component Value Date/Time   WBC 8.6 03/18/2014 0600   RBC 3.35* 03/18/2014 0600   RBC 3.49* 12/25/2013 0545   HGB 8.8* 03/18/2014 0600   HCT 28.0* 03/18/2014 0600   PLT 171 03/18/2014 0600   MCV 83.6 03/18/2014 0600   MCH 26.3 03/18/2014 0600   MCHC 31.4 03/18/2014 0600   RDW 17.6* 03/18/2014 0600   LYMPHSABS 0.6* 03/17/2014 1726   MONOABS 0.6 03/17/2014 1726   EOSABS 0.1 03/17/2014 1726   BASOSABS 0.1 03/17/2014 1726     CMP Latest Ref Rng 03/18/2014 03/17/2014 02/18/2014  Glucose 70 - 99 mg/dL 211(D) 552(C) 802(M)  BUN 6 - 23 mg/dL 33(K) 12(A) 16  Creatinine 0.50 - 1.10 mg/dL 4.49(P) 5.30(Y) 5.11(M)  Sodium 135 - 145 mmol/L 140 141 139  Potassium 3.5 - 5.1 mmol/L 4.3 4.5 3.9  Chloride 96 - 112 mmol/L 102 104 102  CO2 19 - 32 mmol/L 27 26 26   Calcium 8.4 -  10.5 mg/dL 8.6 9.0 2.1(R)  Total Protein 6.0 - 8.3 g/dL 6.1 - 6.4  Total Bilirubin 0.3 - 1.2 mg/dL 0.5 - 0.5  Alkaline Phos 39 - 117 U/L 113 - 94  AST 0 - 37 U/L 21 - 35  ALT 0 - 35 U/L 35 - 27    Albumin  2   ASSESSMENT:  1.  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  Will look at schedule for possible agram tomorrow to avoid dialysis day.  2.  Severe protein calorie malnutrition needs protein supplement to improve wound healing  3.  Will need cardiology eval for risk stratification if requires bypass   PLAN:  Will check schedule most likely agram tomorrow will confirm later today. Risk benefits possible complications explained to pt as well as plan.    Fabienne Bruns, MD [facility] [place] Office: [phone] Pager: [phone]

## 2014-03-18 NOTE — Progress Notes (Signed)
Bilateral lower extremity venous duplex completed:  No evidence of DVT, superficial thrombosis, or Baker's cyst.   

## 2014-03-18 NOTE — Consult Note (Signed)
[facility] Renal Consultation Note  Indication for Consultation:  Management of ESRD/hemodialysis; anemia, hypertension/volume and secondary hyperparathyroidism  HPI: Elizabeth [name] is a 69 y.o. female admitted with pain in Left foot with wound on her left foot . She  was being followed by Dr Jerline Pain at the wound center.Yesterday her [facility] RN told her to come to ER because of presentation of wound at home evidently. She is on dialysis TTS /last HD on schedule Saturday using R Ij  Perm cath with clotted LUA AVF with reported plans by [facility] for Vein mapping  03/28/14 to eval for permanent access.She denies any fever or chills , nausea or vomiting  And she denies any chest pain or shortness of breath.Denies any Claudication .Apparently wound care nurse thought wound was not bad last week but cellulitic this week..         Past Medical History  Diagnosis Date  . Hyperlipidemia   . Urinary retention     DX NEUROGENIC BLADDER--HAS INDWELLING FOLEY CATHETER  . Arthritis   . Anemia   . CAD (coronary artery disease)   . Neuropathic pain   . Shortness of breath     WITH EXERTION  . Stroke     patient not aware.  . Foley catheter in place     for urinary retention  . Critical lower limb ischemia   . Hypertension   . Peripheral vascular disease   . Hypoglycemia 08/19/2013  . Hypokalemia 08/19/2013  . Chronic kidney disease     PT TOLD RECENT TESTING SHOWS KIDNEY DAMAGE--IS SEEING NEPHROLOGIST.  OFFICE NOTES OBTAINED DR. MATTINGLY -"CHRONIC KIDNEY DISEASE 4 MOST LIKELY SECONDARY TO DIABETES"  . Diabetes mellitus without complication     TAKES INSULIN     Past Surgical History  Procedure Laterality Date  . No previous surgery    . Insertion of suprapubic catheter N/A 05/16/2012    Procedure: INSERTION OF SUPRAPUBIC CATHETER;  Surgeon: Alexis Frock, MD;  Location: [facility];  Service: Urology;  Laterality: N/A;  . Bascilic vein transposition Left 10/30/2012    Procedure: LEFT 1ST  STAGE BASCILIC VEIN TRANSPOSITION;  Surgeon: Angelia Mould, MD;  Location: [facility] OR;  Service: Vascular;  Laterality: Left;  . Nm myocar perf wall motion  04/19/2012    low risk study-EF 44%,mild inferolateral hypkinesis  . Bascilic vein transposition Left 02/05/2013    Procedure: BASCILIC VEIN TRANSPOSITION 2ND STAGE;  Surgeon: Angelia Mould, MD;  Location: [place];  Service: Vascular;  Laterality: Left;  . Radiology with anesthesia N/A 10/25/2013    Procedure: RADIOLOGY WITH ANESTHESIA;  Surgeon: Medication Radiologist, MD;  Location: [place];  Service: Radiology;  Laterality: N/A;  . Fistulogram Left 04/29/2013    Procedure: FISTULOGRAM;  Surgeon: Angelia Mould, MD;  Location: [facility] CATH LAB;  Service: Cardiovascular;  Laterality: Left;  . Angioplasty Left 04/29/2013    Procedure: ANGIOPLASTY;  Surgeon: Angelia Mould, MD;  Location: [facility] CATH LAB;  Service: Cardiovascular;  Laterality: Left;      Family History  Problem Relation Age of Onset  . Hyperlipidemia Mother   . Hypertension Mother   . Hodgkin's lymphoma Father   . Heart disease Sister   Social = LIVES ALONE AND    reports that she has never smoked. She has never used smokeless tobacco. She reports that she does not drink alcohol or use illicit drugs.  No Known Allergies  Prior to Admission medications   Medication Sig Start Date End Date Taking?  Authorizing Provider  carvedilol (COREG) 6.25 MG tablet Take 6.25 mg by mouth 2 (two) times daily with a meal.   Yes Historical Provider, MD  glucose blood (ONETOUCH VERIO) test strip Use as instructed to check blood sugar 4 times per day dx code E13.10 02/17/14  Yes Elayne Snare, MD  insulin aspart (NOVOLOG) 100 UNIT/ML injection Inject 0-12 Units into the skin 2 (two) times daily. Sliding scale: 0-149=0; 150-199=2; 200-249=4; 250-299=6; 300-349=8; 350-399=10; 400-499=12; 450> = Call MD   Yes Historical Provider, MD  multivitamin (RENA-VIT) TABS tablet Take 1 tablet by  mouth daily.   Yes Historical Provider, MD  [facility] DELICA LANCETS FINE MISC Use to check blood sugar 4 times per day 09/16/13  Yes Elayne Snare, MD  oxyCODONE-acetaminophen (PERCOCET/ROXICET) 5-325 MG per tablet Take 1-2 tablets by mouth every 8 (eight) hours as needed for moderate pain or severe pain. 12/28/13  Yes Ripudeep Krystal Eaton, MD  pravastatin (PRAVACHOL) 20 MG tablet Take 20 mg by mouth daily.   Yes Historical Provider, MD  sevelamer carbonate (RENVELA) 800 MG tablet Take 1 tablet (800 mg total) by mouth 3 (three) times daily with meals. 12/28/13  Yes Ripudeep Krystal Eaton, MD  aspirin EC 81 MG tablet Take 81 mg by mouth daily.    Historical Provider, MD  cyproheptadine (PERIACTIN) 4 MG tablet Take 4 mg by mouth daily.    Historical Provider, MD  Nutritional Supplements (FEEDING SUPPLEMENT, NEPRO CARB STEADY,) LIQD Take 237 mLs by mouth as needed (missed meal during dialysis.). Patient taking differently: Take 237 mLs by mouth daily.  10/24/13   Belkys A Regalado, MD  Nutritional Supplements (FEEDING SUPPLEMENT, NEPRO CARB STEADY,) LIQD Take 237 mLs by mouth 3 (three) times daily between meals. 12/28/13   Ripudeep Krystal Eaton, MD  promethazine (PHENERGAN) 12.5 MG tablet Take 1 tablet (12.5 mg total) by mouth every 6 (six) hours as needed for nausea or vomiting. 12/28/13   Ripudeep Krystal Eaton, MD     Anti-infectives    Start     Dose/Rate Route Frequency Ordered Stop   03/18/14 2030  piperacillin-tazobactam (ZOSYN) IVPB 2.25 g  Status:  Discontinued     2.25 g100 mL/hr over 30 Minutes Intravenous 4 times per day 03/17/14 2056 03/17/14 2100   03/18/14 1200  vancomycin (VANCOCIN) 500 mg in sodium chloride 0.9 % 100 mL IVPB     500 mg100 mL/hr over 60 Minutes Intravenous Every T-Th-Sa (Hemodialysis) 03/17/14 2006     03/17/14 2230  piperacillin-tazobactam (ZOSYN) IVPB 2.25 g     2.25 g100 mL/hr over 30 Minutes Intravenous 3 times per day 03/17/14 2223     03/17/14 2130  vancomycin (VANCOCIN) IVPB 750 mg/150 ml premix      750 mg150 mL/hr over 60 Minutes Intravenous  Once 03/17/14 2109 03/17/14 2350   03/17/14 2030  piperacillin-tazobactam (ZOSYN) IVPB 2.25 g  Status:  Discontinued     2.25 g100 mL/hr over 30 Minutes Intravenous 3 times per day 03/17/14 2006 03/17/14 2056   03/17/14 2030  piperacillin-tazobactam (ZOSYN) IVPB 2.25 g  Status:  Discontinued     2.25 g100 mL/hr over 30 Minutes Intravenous 3 times per day 03/17/14 2100 03/17/14 2223      Results for orders placed or performed during the hospital encounter of 03/17/14 (from the past 48 hour(s))  CBC with Differential/Platelet     Status: Abnormal   Collection Time: 03/17/14  5:26 PM  Result Value Ref Range   WBC 11.6 (H) 4.0 - 10.5  K/uL   RBC 4.00 3.87 - 5.11 MIL/uL   Hemoglobin 10.4 (L) 12.0 - 15.0 g/dL   HCT 32.9 (L) 36.0 - 46.0 %   MCV 82.3 78.0 - 100.0 fL   MCH 26.0 26.0 - 34.0 pg   MCHC 31.6 30.0 - 36.0 g/dL   RDW 17.4 (H) 11.5 - 15.5 %   Platelets 177 150 - 400 K/uL   Neutrophils Relative % 90 (H) 43 - 77 %   Neutro Abs 10.4 (H) 1.7 - 7.7 K/uL   Lymphocytes Relative 5 (L) 12 - 46 %   Lymphs Abs 0.6 (L) 0.7 - 4.0 K/uL   Monocytes Relative 5 3 - 12 %   Monocytes Absolute 0.6 0.1 - 1.0 K/uL   Eosinophils Relative 0 0 - 5 %   Eosinophils Absolute 0.1 0.0 - 0.7 K/uL   Basophils Relative 0 0 - 1 %   Basophils Absolute 0.1 0.0 - 0.1 K/uL  Basic metabolic panel     Status: Abnormal   Collection Time: 03/17/14  5:26 PM  Result Value Ref Range   Sodium 141 135 - 145 mmol/L   Potassium 4.5 3.5 - 5.1 mmol/L   Chloride 104 96 - 112 mmol/L   CO2 26 19 - 32 mmol/L   Glucose, Bld 126 (H) 70 - 99 mg/dL   BUN 42 (H) 6 - 23 mg/dL   Creatinine, Ser 3.63 (H) 0.50 - 1.10 mg/dL   Calcium 9.0 8.4 - 10.5 mg/dL   GFR calc non Af Amer 12 (L) >90 mL/min   GFR calc Af Amer 14 (L) >90 mL/min    Comment: (NOTE) The eGFR has been calculated using the CKD EPI equation. This calculation has not been validated in all clinical situations. eGFR's  persistently <90 mL/min signify possible Chronic Kidney Disease.    Anion gap 11 5 - 15  Sedimentation rate     Status: Abnormal   Collection Time: 03/17/14  5:31 PM  Result Value Ref Range   Sed Rate 85 (H) 0 - 22 mm/hr  Glucose, capillary     Status: Abnormal   Collection Time: 03/17/14 10:34 PM  Result Value Ref Range   Glucose-Capillary 225 (H) 70 - 99 mg/dL  Comprehensive metabolic panel     Status: Abnormal   Collection Time: 03/18/14  6:00 AM  Result Value Ref Range   Sodium 140 135 - 145 mmol/L   Potassium 4.3 3.5 - 5.1 mmol/L   Chloride 102 96 - 112 mmol/L   CO2 27 19 - 32 mmol/L   Glucose, Bld 173 (H) 70 - 99 mg/dL   BUN 48 (H) 6 - 23 mg/dL   Creatinine, Ser 4.15 (H) 0.50 - 1.10 mg/dL   Calcium 8.6 8.4 - 10.5 mg/dL   Total Protein 6.1 6.0 - 8.3 g/dL   Albumin 2.1 (L) 3.5 - 5.2 g/dL   AST 21 0 - 37 U/L   ALT 35 0 - 35 U/L   Alkaline Phosphatase 113 39 - 117 U/L   Total Bilirubin 0.5 0.3 - 1.2 mg/dL   GFR calc non Af Amer 10 (L) >90 mL/min   GFR calc Af Amer 12 (L) >90 mL/min    Comment: (NOTE) The eGFR has been calculated using the CKD EPI equation. This calculation has not been validated in all clinical situations. eGFR's persistently <90 mL/min signify possible Chronic Kidney Disease.    Anion gap 11 5 - 15  CBC     Status: Abnormal  Collection Time: 03/18/14  6:00 AM  Result Value Ref Range   WBC 8.6 4.0 - 10.5 K/uL   RBC 3.35 (L) 3.87 - 5.11 MIL/uL   Hemoglobin 8.8 (L) 12.0 - 15.0 g/dL   HCT 28.0 (L) 36.0 - 46.0 %   MCV 83.6 78.0 - 100.0 fL   MCH 26.3 26.0 - 34.0 pg   MCHC 31.4 30.0 - 36.0 g/dL   RDW 17.6 (H) 11.5 - 15.5 %   Platelets 171 150 - 400 K/uL  Protime-INR     Status: None   Collection Time: 03/18/14  6:00 AM  Result Value Ref Range   Prothrombin Time 14.1 11.6 - 15.2 seconds   INR 1.07 0.00 - 1.49  Glucose, capillary     Status: Abnormal   Collection Time: 03/18/14  7:50 AM  Result Value Ref Range   Glucose-Capillary 164 (H) 70 - 99  mg/dL  Glucose, capillary     Status: Abnormal   Collection Time: 03/18/14 11:37 AM  Result Value Ref Range   Glucose-Capillary 176 (H) 70 - 99 mg/dL    ROS:  See hpi for positives Physical Exam: Filed Vitals:   03/18/14 0552  BP: 106/62  Pulse: 92  Temp: 99 F (37.2 C)  Resp: 20     General: alert, thin Cachetic elderly BF  Nad, OX3 HEENT: [place] , MMdry nonicteric  Neck: no jvd Heart:  RRR , no mur, rub, or gallop Gr 2/6 [place] M Lungs: CTA bilat, no labored breathibng  Decreased bs Abdomen: thin  Bs Pos. SOft , nt, nd  Liver down 5 cm Extremities:  No pedal edema Trophic changes in feet Skin: R 2nd toe dry Gangrene Reddened to mid foot.  Neuro: alrt Ox3 moves all extrem  Dialysis Access: LUA AVF no bruit or thrill / Merwyn Katos cath  Dialysis Orders: Center: [facility]  on TTS . EDW 35.5 kg HD Bath 2.0 k 2.25ca  Time  3hr 58mn  Heparin 3700. Access R IJ perm cath / clotted LUA AVF 9 oop plans for [facility] Vein mapping 03/28/14        No esa. fe  or vit d  Other op labs hgb 11.5  , ca 10.0 , pos 3.9/ pth 84  Assessment/Plan 1. L  Foot  Dry Gangrene involving 2nd toe - [facility] seeing with Arteriogram   Planned tomor 2. ESRD -  TTS HD ( [facility] op unit) 3. Hypertension/volume  - wt up 1 kg  But looks dry / bp 106/62  At home listed 6.125 bid coreg Will d/c with low bp 4. Anemia  - no esa listed as op  Start 20108m Araensp  With hgb 8.8 and ck pre hd today also  17% op tfs  With start Fe load in hosp.  5. Metabolic bone disease -  renvela binder no vit d find out what outpatient PTH is 6. Nutrition -  Malnourished use protein supplement  7. FTT  Need to address  DaErnest HaberPA-C [phone]/26/2016, 12:36 PM  I have seen and examined this patient and agree with the plan of care seen, eval, counseled, examined. .  Kenyana Husak L 03/18/2014, 3:21 PM

## 2014-03-18 NOTE — Consult Note (Signed)
Pt for arteriogram tomorrow.  Preop orders written.  Will need to be NPO p midnight.  Fabienne Bruns, MD [facility] Office: [phone] Pager: [phone]

## 2014-03-18 NOTE — Progress Notes (Signed)
Admitted pt from ED via stretcher, oriented to room, call bell placed within reach. Admission assessment done, orders carried out. Will continue to monitor.   03/17/14 2106  Vitals  Temp 98.5 F (36.9 C)  Temp Source Oral  BP 121/68 mmHg  BP Location Right Arm  BP Method Automatic  Patient Position (if appropriate) Lying  Pulse Rate 92  Resp 18  Oxygen Therapy  SpO2 97 %  Height and Weight  Height 5\' 3"  (1.6 m)  Weight 36.8 kg (81 lb 2.1 oz)  BSA (Calculated - sq m) 1.28 sq meters  BMI (Calculated) 14.4  Weight in (lb) to have BMI = 25 140.8

## 2014-03-18 NOTE — Progress Notes (Signed)
INITIAL NUTRITION ASSESSMENT  Pt meets criteria for SEVERE MALNUTRITION in the context of chronic illness as evidenced by a 5.8% weight loss in 1 month and severe fat and muscle mass loss.  DOCUMENTATION CODES Per approved criteria  -Severe malnutrition in the context of chronic illness -Underweight   INTERVENTION: Continue Nepro Shake po TID, each supplement provides 425 kcal and 19 grams protein.  Encourage adequate PO intake.   NUTRITION DIAGNOSIS: Increased nutrient needs related to chronic illness, ESRD as evidenced by estimated nutrition needs.   Goal: Pt to meet >/= 90% of their estimated nutrition needs   Monitor:  PO intake, weight trends, labs, I/O's  Reason for Assessment: MST  69 y.o. female  Admitting Dx: Cellulitis of foot  ASSESSMENT: Pt with Past medical history of Dyslipidemia, recent CVA, peripheral vascular disease, hypertension, diabetes mellitus. The patient presented with complaints of infection ofLeft foot toe.  Pt reports having a great appetite currently and PTA with 3 full meals a day along with a milkshake that she makes. Noted per Epic weight records, pt with a 5.8% weight loss in 1 month. Pt reports her usual body weight is 87 lbs, which she weighed last month. Pt is unaware of weight loss. Pt currently has Nepro Shake ordered TID. Will continue with current orders. Pt was encouraged to eat her food at meals. Pt was additionally educated to continue Nepro Shakes at home for aid in caloric and protein needs as well as to prevent further weight loss.  Nutrition Focused Physical Exam:  Subcutaneous Fat:  Orbital Region: N/A Upper Arm Region: Severe depletion Thoracic and Lumbar Region: Severe depletion  Muscle:  Temple Region: Severe depletion Clavicle Bone Region: Severe depletion Clavicle and Acromion Bone Region: Severe depletion Scapular Bone Region: N/A Dorsal Hand: Severe depletion Patellar Region: Severe depletion Anterior Thigh  Region: Severe depletion Posterior Calf Region: Severe depletion  Edema: none  Labs: Low GFR. High BUN and creatinine.  Height: Ht Readings from Last 1 Encounters:  03/17/14  (1.6 m)    Weight: Wt Readings from Last 1 Encounters:  03/17/14 81 lb 2.1 oz (36.8 kg)    Ideal Body Weight: 115 lbs  % Ideal Body Weight: 70%  Wt Readings from Last 10 Encounters:  03/17/14 81 lb 2.1 oz (36.8 kg)  02/18/14 86 lb (39.009 kg)  12/28/13 82 lb 14.3 oz (37.6 kg)  12/27/13 84 lb (38.102 kg)  10/24/13 90 lb 6.2 oz (41 kg)  10/02/13 84 lb 9.6 oz (38.374 kg)  09/16/13 83 lb 9.6 oz (37.921 kg)  08/20/13 89 lb 15.2 oz (40.8 kg)  06/12/13 85 lb (38.556 kg)  04/17/13 81 lb 14.4 oz (37.15 kg)    Usual Body Weight: 87 lbs (per pt report)  % Usual Body Weight: 93%  BMI:  Body mass index is 14.38 kg/(m^2). 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  Diet Order: Diet NPO time specified Except for: Ice Chips, Sips with Meds Diet NPO time specified Except for: Sips with Meds  EDUCATION NEEDS: -Education needs addressed   Intake/Output Summary (Last 24 hours) at 03/18/14 1052 Last data filed at 03/18/14 0900  Gross per 24 hour  Intake    100 ml  Output      0 ml  Net    100 ml    Last BM: 1/25  Labs:   Recent Labs Lab 03/17/14  1726 03/18/14 0600  NA 141 140  K 4.5 4.3  CL 104 102  CO2 26 27  BUN 42* 48*  CREATININE 3.63* 4.15*  CALCIUM 9.0 8.6  GLUCOSE 126* 173*    CBG (last 3)   Recent Labs  03/17/14 2234 03/18/14 0750  GLUCAP 225* 164*    Scheduled Meds: . aspirin EC  81 mg Oral Daily  . carvedilol  6.25 mg Oral BID WC  . feeding supplement (NEPRO CARB STEADY)  237 mL Oral TID WC  . heparin  5,000 Units Subcutaneous 3 times per day  . insulin aspart  0-15 Units Subcutaneous TID WC  . insulin aspart  0-5 Units  Subcutaneous QHS  . piperacillin-tazobactam (ZOSYN)  IV  2.25 g Intravenous 3 times per day  . pravastatin  20 mg Oral q1800  . sevelamer carbonate  800 mg Oral TID WC  . vancomycin  500 mg Intravenous Q T,Th,Sa-HD    Continuous Infusions:   Past Medical History  Diagnosis Date  . Hyperlipidemia   . Urinary retention     DX NEUROGENIC BLADDER--HAS INDWELLING FOLEY CATHETER  . Arthritis   . Anemia   . CAD (coronary artery disease)   . Neuropathic pain   . Shortness of breath     WITH EXERTION  . Stroke     patient not aware.  . Foley catheter in place     for urinary retention  . Critical lower limb ischemia   . Hypertension   . Peripheral vascular disease   . Hypoglycemia 08/19/2013  . Hypokalemia 08/19/2013  . Chronic kidney disease     PT TOLD RECENT TESTING SHOWS KIDNEY DAMAGE--IS SEEING NEPHROLOGIST.  OFFICE NOTES OBTAINED DR. MATTINGLY -"CHRONIC KIDNEY DISEASE 4 MOST LIKELY SECONDARY TO DIABETES"  . Diabetes mellitus without complication     TAKES INSULIN     Past Surgical History  Procedure Laterality Date  . No previous surgery    . Insertion of suprapubic catheter N/A 05/16/2012    Procedure: INSERTION OF SUPRAPUBIC CATHETER;  Surgeon: Sebastian Ache, MD;  Location: [facility];  Service: Urology;  Laterality: N/A;  . Bascilic vein transposition Left 10/30/2012    Procedure: LEFT 1ST STAGE BASCILIC VEIN TRANSPOSITION;  Surgeon: Chuck Hint, MD;  Location: [facility] OR;  Service: Vascular;  Laterality: Left;  . Nm myocar perf wall motion  04/19/2012    low risk study-EF 44%,mild inferolateral hypkinesis  . Bascilic vein transposition Left 02/05/2013    Procedure: BASCILIC VEIN TRANSPOSITION 2ND STAGE;  Surgeon: Chuck Hint, MD;  Location: [facility] OR;  Service: Vascular;  Laterality: Left;  . Radiology with anesthesia N/A 10/25/2013    Procedure: RADIOLOGY WITH ANESTHESIA;  Surgeon: Medication Radiologist, MD;  Location: [facility] OR;  Service: Radiology;  Laterality: N/A;   . Fistulogram Left 04/29/2013    Procedure: FISTULOGRAM;  Surgeon: Chuck Hint, MD;  Location: [facility] CATH LAB;  Service: Cardiovascular;  Laterality: Left;  . Angioplasty Left 04/29/2013    Procedure: ANGIOPLASTY;  Surgeon: Chuck Hint, MD;  Location: [facility] CATH LAB;  Service: Cardiovascular;  Laterality: Left;    Marijean Niemann, MS, RD, LDN Pager # [phone] After hours/ weekend pager # [phone]

## 2014-03-19 ENCOUNTER — Telehealth: Payer: Self-pay | Admitting: Vascular Surgery

## 2014-03-19 ENCOUNTER — Encounter (HOSPITAL_COMMUNITY): Payer: Self-pay | Admitting: Vascular Surgery

## 2014-03-19 ENCOUNTER — Encounter (HOSPITAL_COMMUNITY)
Admission: EM | Disposition: A | Discharge status: Skilled Nursing Facility | Payer: Self-pay | Source: Home / Self Care | Attending: Internal Medicine

## 2014-03-19 ENCOUNTER — Other Ambulatory Visit: Payer: Self-pay | Admitting: *Deleted

## 2014-03-19 DIAGNOSIS — I739 Peripheral vascular disease, unspecified: Secondary | ICD-10-CM

## 2014-03-19 DIAGNOSIS — Z01810 Encounter for preprocedural cardiovascular examination: Secondary | ICD-10-CM

## 2014-03-19 DIAGNOSIS — E1310 Other specified diabetes mellitus with ketoacidosis without coma: Secondary | ICD-10-CM

## 2014-03-19 HISTORY — PX: ABDOMINAL AORTAGRAM: SHX5454

## 2014-03-19 LAB — BASIC METABOLIC PANEL
ANION GAP: 5 (ref 5–15)
BUN: 25 mg/dL — ABNORMAL HIGH (ref 6–23)
CALCIUM: 7.9 mg/dL — AB (ref 8.4–10.5)
CO2: 30 mmol/L (ref 19–32)
Chloride: 96 mmol/L (ref 96–112)
Creatinine, Ser: 2.65 mg/dL — ABNORMAL HIGH (ref 0.50–1.10)
GFR calc non Af Amer: 17 mL/min — ABNORMAL LOW (ref >90)
GFR, EST AFRICAN AMERICAN: 20 mL/min — AB (ref >90)
Glucose, Bld: 144 mg/dL — ABNORMAL HIGH (ref 70–99)
POTASSIUM: 3.8 mmol/L (ref 3.5–5.1)
Sodium: 131 mmol/L — ABNORMAL LOW (ref 135–145)

## 2014-03-19 LAB — CBC
HCT: 31.4 % — ABNORMAL LOW (ref 36.0–46.0)
HEMOGLOBIN: 9.8 g/dL — AB (ref 12.0–15.0)
MCH: 25.4 pg — AB (ref 26.0–34.0)
MCHC: 31.2 g/dL (ref 30.0–36.0)
MCV: 81.3 fL (ref 78.0–100.0)
PLATELETS: 178 10*3/uL (ref 150–400)
RBC: 3.86 MIL/uL — ABNORMAL LOW (ref 3.87–5.11)
RDW: 17.5 % — AB (ref 11.5–15.5)
WBC: 9.1 10*3/uL (ref 4.0–10.5)

## 2014-03-19 LAB — GLUCOSE, CAPILLARY
GLUCOSE-CAPILLARY: 157 mg/dL — AB (ref 70–99)
Glucose-Capillary: 154 mg/dL — ABNORMAL HIGH (ref 70–99)
Glucose-Capillary: 325 mg/dL — ABNORMAL HIGH (ref 70–99)
Glucose-Capillary: 123 mg/dL — ABNORMAL HIGH (ref 70–99)

## 2014-03-19 SURGERY — ABDOMINAL AORTAGRAM
Anesthesia: LOCAL

## 2014-03-19 MED ORDER — METOPROLOL TARTRATE 1 MG/ML IV SOLN: 2.0000 mg | INTRAVENOUS | Status: DC | PRN

## 2014-03-19 MED ORDER — MORPHINE SULFATE 2 MG/ML IJ SOLN
2.0000 mg | INTRAMUSCULAR | Status: DC | PRN
  Administered 2014-03-20 – 2014-03-23 (×5): 2 mg via INTRAVENOUS
  Administered 2014-03-24: 4 mg via INTRAVENOUS
  Filled 2014-03-19 (×2): qty 1
  Filled 2014-03-19: qty 2
  Filled 2014-03-19 (×3): qty 1

## 2014-03-19 MED ORDER — HYDRALAZINE HCL 20 MG/ML IJ SOLN: 5.0000 mg | INTRAMUSCULAR | Status: DC | PRN

## 2014-03-19 MED ORDER — LABETALOL HCL 5 MG/ML IV SOLN
10.0000 mg | INTRAVENOUS | Status: DC | PRN
  Filled 2014-03-19: qty 4

## 2014-03-19 NOTE — Progress Notes (Signed)
[facility] PROGRESS NOTE   Elizabeth [name] XBM:[id number] DOB: 03-Jan-1946 DOA: 03/17/2014 PCP: [name], MD  HPI: The patient presented with complaints of infection ofLeft foot toe. She was found to have gangrenous changes. She was subsequently admitted to the hospital.   Subjective: She denies any pain currently. No other complaints offered.    Assessment/Plan: Principal Problem:   Cellulitis of foot Active Problems:   HLD (hyperlipidemia)   Anemia of chronic disease   End stage renal disease   DM (diabetes mellitus) type 2, uncontrolled, with ketoacidosis   Protein-calorie malnutrition, severe   Neuropathy   Ulcer of toe of left foot   Cellulitis of Left foot/Ulcer of Toe of Left Foot -Likely due to peripheral vascular disease and diabetes history. -Xray of left foot showed "Suspect soft tissue ulcer about the second digit, without gross osseous destruction." -MRI w/o contrast of left foot showed "Devitalization of the middle phalanx of the second toe. Abnormal signal from the proximal phalanx of the second toe is most likely due being devitalized. There is no discrete destruction of the proximal phalanx. Adjacent cellulitis. The distal phalanx of the second toe is missing." -Bilateral lower extremity venous duplex showed no evidence of DVT, superficial thrombosis, or Baker's cyst. -Continue vancomycin and zosyn. -Patient underwent arteriogram this morning. Plan is to get cardiology input. Timing of the revascularization and amputation is not clear.  DM Type 2, uncontrolled, with ketoacidosis Continue NovoLOG on sliding scale.  End stage renal disease TTS/Hyponatremia -Renal failure likely due to diabetes -Management per Nephrology  Hyperlipidemia -Continue pravastatin.   Hypertension -Continue carvedilol.  Anemia of Chronic disease -Hgb remains stable.  DVT Prophylaxis: Heparin Code Status: Full Code Family Communication: Plan discussed with the  patient. Disposition Plan: Not ready for discharge  Consultants:  Vascular surgery  Procedures:  Arteriogram 03/19/2014  Antibiotics:  Vancomycin 1/25>>  Zosyn 1/25>>   Objective: Filed Vitals:   03/19/14 1433  BP: 118/72  Pulse: 112  Temp: 98.6 F (37 C)  Resp:     Intake/Output Summary (Last 24 hours) at 03/19/14 1540 Last data filed at 03/18/14 1956  Gross per 24 hour  Intake    120 ml  Output   1000 ml  Net   -880 ml   Filed Weights   03/18/14 1408 03/18/14 1744 03/19/14 0504  Weight: 36.5 kg (80 lb 7.5 oz) 35.5 kg (78 lb 4.2 oz) 35.9 kg (79 lb 2.3 oz)    Exam: General: Alert and awake, oriented x3, not in any acute distress, CVS: S1-S2 clear, possible murmur. Chest: clear to auscultation bilaterally, no wheezing, rales or rhonchi;  Abdomen: soft, nontender, nondistended, quiet bowel sounds, scar in hypogastric region (pt says from suprapubic catheter) Extremities: Left foot second toe is black and necrotic; left foot is tender to light palpation. Pedal pulses difficult to palpate. No focal deficits  Data Reviewed: Basic Metabolic Panel:  Recent Labs Lab 03/17/14 1726 03/18/14 0600 03/19/14 0629  NA 141 140 131*  K 4.5 4.3 3.8  CL 104 102 96  CO2 GLUCOSE 126* 173* 144*  BUN 42* 48* 25*  CREATININE 3.63* 4.15* 2.65*  CALCIUM 9.0 8.6 7.9*   Liver Function Tests:  Recent Labs Lab 03/18/14 0600  AST 21  ALT 35  ALKPHOS 113  BILITOT 0.5  PROT 6.1  ALBUMIN 2.1*   CBC:  Recent Labs Lab 03/17/14 1726 03/18/14 0600 03/19/14 0629  WBC 11.6* 8.6 9.1  NEUTROABS 10.4*  --   --  HGB 10.4* 8.8* 9.8*  HCT 32.9* 28.0* 31.4*  MCV 82.3 83.6 81.3  PLT 177 171 178   CBG:  Recent Labs Lab 03/18/14 1807 03/18/14 2104 03/18/14 2301 03/19/14 0751 03/19/14 0934  GLUCAP 186* 288* 291* 157* 154*    Micro Recent Results (from the past 240 hour(s))  Culture, blood (single)     Status: None (Preliminary result)   Collection  Time: 03/17/14  5:31 PM  Result Value Ref Range Status   Specimen Description BLOOD ARM RIGHT  Final   Special Requests BOTTLES DRAWN AEROBIC AND ANAEROBIC 5CC  Final   Culture   Final           BLOOD CULTURE RECEIVED NO GROWTH TO DATE CULTURE WILL BE HELD FOR 5 DAYS BEFORE ISSUING A FINAL NEGATIVE REPORT Performed at [facility]    Report Status PENDING  Incomplete     Studies: Mr Foot Left Wo Contrast  03/18/2014   CLINICAL DATA:  Cellulitis of the left foot.  EXAM: MRI OF THE LEFT FOREFOOT WITHOUT CONTRAST  TECHNIQUE: Multiplanar, multisequence MR imaging was performed. No intravenous contrast was administered.  COMPARISON:  Radiographs dated 03/17/2014  FINDINGS: There is diffuse abnormal signal from the proximal phalanx of the second toe. The middle phalanx is exposed and devitalized with very little signal. The distal phalanx is missing. There is a large soft tissue defect at the stump of the second toe.  There is abnormal subcutaneous edema at the base of the second and third toes particularly along the plantar aspect and extending in the web space between the first and second toes. No discrete abscess. There is no osteomyelitis of the other toes.  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  IMPRESSION: Devitalization of the middle phalanx of the second toe. Abnormal signal from the proximal phalanx of the second toe is most likely due being devitalized. There is no discrete destruction of the proximal phalanx.  Adjacent cellulitis. The distal phalanx of the second toe is missing. Middle phalanx is devitalized.   Electronically Signed   By: Geanie Cooley M.D.   On: 03/18/2014 07:49   Dg Foot Complete Left  03/17/2014   CLINICAL DATA:  Infection of second toe of left foot.  EXAM: LEFT FOOT - COMPLETE 3+ VIEW  COMPARISON:  None.  FINDINGS: Advanced vascular calcifications. Mild hallux valgus deformity with osteoarthritis of the first metatarsal phalangeal  joint. Soft tissue defect about the distal second digit. No gross osseous destruction identified. Extensive overlap of digits on the lateral view. No soft tissue gas.  IMPRESSION: Suspect soft tissue ulcer about the second digit, without gross osseous destruction. Dependent on clinical concern, consider contrast-enhanced MRI.   Electronically Signed   By: Jeronimo Greaves M.D.   On: 03/17/2014 18:52    Scheduled Meds: . aspirin EC  81 mg Oral Daily  . darbepoetin (ARANESP) injection - DIALYSIS  200 mcg Intravenous Q Tue-HD  . feeding supplement (NEPRO CARB STEADY)  237 mL Oral TID WC  . ferric gluconate (FERRLECIT/NULECIT) IV  250 mg Intravenous Q T,Th,Sa-HD  . heparin  5,000 Units Subcutaneous 3 times per day  . insulin aspart  0-15 Units Subcutaneous TID WC  . insulin aspart  0-5 Units Subcutaneous QHS  . piperacillin-tazobactam (ZOSYN)  IV  2.25 g Intravenous 3 times per day  . pravastatin  20 mg Oral q1800  . sevelamer carbonate  800 mg Oral TID WC  . vancomycin  500 mg Intravenous  Q T,Th,Sa-HD   Continuous Infusions:    Time spent: 20 minutes    [facility] Pager [phone]    If 7PM-7AM, please contact night-coverage at [email or web address], password [email or web address] 03/19/2014, 3:40 PM  LOS: 2 days

## 2014-03-19 NOTE — Care Management Note (Unsigned)
    Page 1 of 1   03/19/2014     3:02:19 PM CARE MANAGEMENT NOTE 03/19/2014  Patient:  Elizabeth [name],Elizabeth [name]   Account Number:  [id number]  Date Initiated:  03/19/2014  Documentation initiated by:  Letha Cape  Subjective/Objective Assessment:   dx pvd, gangrene foot, stage 4 sacral ulcer, chronic foley,  admit- lives alone.  active with [facility] for HHRN,PT.     Action/Plan:   Anticipated DC Date:  03/20/2014   Anticipated DC Plan:  HOME W HOME HEALTH SERVICES      DC Planning Services  CM consult      Choice offered to / List presented to:             Status of service:  In process, will continue to follow Medicare Important Message given?   (If response is "NO", the following Medicare IM given date fields will be blank) Date Medicare IM given:   Medicare IM given by:   Date Additional Medicare IM given:   Additional Medicare IM given by:    Discharge Disposition:    Per UR Regulation:  Reviewed for med. necessity/level of care/duration of stay  If discussed at Long Length of Stay Meetings, dates discussed:    Comments:  03/19/14 1114 Letha Cape RN, BSN [phone] patient is for arerigram today , NCM will cont to follow for dc needs.

## 2014-03-19 NOTE — Progress Notes (Signed)
Utilization review completed.  

## 2014-03-19 NOTE — Consult Note (Signed)
Pt will need cardiac risk assessment prior to considering revascularization.    Would prefer to hold off on operation until her nutritional status is somewhat improved and her sacral decubitus more accurately assessed.   If she has a sacral wound that is non salvageable would probably not do bypasss  Will follow and await wound care and cardiology assessment.  Fabienne Bruns, MD [facility] [place] Office: [phone] Pager: [phone]

## 2014-03-19 NOTE — Op Note (Signed)
Procedure: Abdominal aortogram with bilateral extremity runoff  Preoperative diagnosis: Gangrene left foot  Postoperative diagnosis: Same  Anesthesia: Local  Operative findings: #1 occlusion anterior tibial and posterior tibial arteries bilaterally                                 #2  90% stenosis origin left peroneal artery                                  #3 one vessel runoff right peroneal with in-line flow  Operative details: After obtaining informed consent, the patient was taken to the PV lab. The patient was placed in supine position on the Angio table. Both groins were prepped and draped in usual sterile fashion. Local anesthesia was inserted over the right common femoral artery. Ultrasound was used to identify the right common femoral artery. An introducer needle was used to cannulate the right common femoral artery without difficulty. An 035 versacore wire threaded up the abdominal aorta under fluoroscopic guidance. Next a 5 French sheath placed over the guidewire and right common femoral artery. This was flushed with heparinized saline. A 5 French pigtail catheter was then placed over the guidewire and advanced into the abdominal aorta. The infrarenal abdominal aorta was patent. The left and right common external and internal iliac arteries are patent. Excellent oblique view of the pelvis was performed in RAO projection which again confirmed wide patency of the aorta and left and right iliac system. Next bilateral lower extremity runoff views were obtained through the pigtail catheter.   In the right lower extremity, the right common femoral profunda femoris and superficial femoral artery is widely patent. The right popliteal artery is patent. The anterior tibial and posterior tibial arteries are occluded in the right leg. The peroneal artery is widely patent all the way to the level of the ankle and gives off 1 communicating branch to the foot.  In the left lower extremity, the left common  femoral artery is patent. The left profunda femoris artery is patent but diseased distally. The left superficial femoral artery is widely patent. The left popliteal artery is widely patent. The anterior tibial and posterior tibial arteries are occluded. The peroneal artery is patent but there is a short segment high-grade stenosis in tandem lesion 80-90%. The peroneal artery is otherwise patent distally is of anterior posterior medicating branches to the foot.  At this point the pigtail catheter was removed over a guidewire and exchanged for a 5 [place] crossover catheter which was used to selectively catheterize the left common iliac artery and the guidewire was advanced down into the left external iliac artery. The Crosser catheter was replaced with a 5 French straight catheter. Magnified views were performed of the below-knee tibial vessels and a lateral foot view was obtained. This confirmed the above findings.  The 5 French straight catheter was then removed over a guidewire. The 5 French sheath was then removed and hemostasis obtained with direct pressure. The patient tolerated the procedure well and there were no complications. The patient was taken to the holding area in stable condition.  Operative management: The patient will need cardiac risk stratification. This will be followed by a localized endarterectomy of the below-knee popliteal and peroneal artery. She will have an amputation of the toe at the same procedure. This is all pending reasonable cardiac risk overall.  Ruta Hinds, MD [facility] [place] Office: [phone] Pager: [phone]

## 2014-03-19 NOTE — Consult Note (Addendum)
WOC consult requested for sacral wound, which is reported to be stage 4.  Pt is being followed by the Vascular team for gangrene to the foot; refer to their team for further questions regarding plan of care for this wound. The wound care nurse mentioned in the nephrology physician notes from 1/26 was NOT the [facility] team; we have not seen this patient since a previous admission when a consult was performed on 12/25/13. Attempted to perform consult as requested on 1/26 but patient was in dialysis and unable to turn. Attempted consult again in AM on 1/27; pt was in a procedure.  Will perform consult when pt is out of cath lab this afternoon. Cammie Mcgee MSN, RN, CWOCN, [place], CNS [phone]

## 2014-03-19 NOTE — Progress Notes (Signed)
Pt waiting bed assignment.  Daughter notified.

## 2014-03-19 NOTE — Consult Note (Addendum)
WOC wound consult note Reason for Consult: Consult requested for sacral wound. VVS team following for plan of care to foot wound.  Pt familiar to [facility] team from previous admission on 11/4.  Wound is noted on sacrum in the EMR but is actually located closer to the left ischium. Pt is emaciated with multiple systemic factors which can impair healing. Nutrition consult has already been ordered. She appears to be well-informed regarding plan of care and states she is followed by the outpatient wound care center prior to admission; she is due for a follow-up appointment next week and has also been having home health assistance for dressing changes.     Wound type: Chronic stage 4  Pressure Ulcer POA: Yes Measurement: 2.5X2.5X.8cm with 2 cm undermining to wound edges. Wound bed: 100% beefy red, bone palpable Drainage (amount, consistency, odor) Mod amt green-tinged drainage on previous dressing, no odor Periwound: Intact skin surrounding. Dressing procedure/placement/frequency: Aquacel to absorb drainage and provide antimicrobial benefits. Discussed plan of care with patient and family members at the bedside; they verbalize understanding.  Recommend continued assistance from home health and pt can resume follow-up with the wound care center after discharge.   Please re-consult if further assistance is needed.  Thank-you,  Cammie Mcgee MSN, RN, CWOCN, [place], CNS [phone]

## 2014-03-19 NOTE — Telephone Encounter (Addendum)
-----   Message from Sharee Pimple, RN sent at 03/19/2014 11:40 AM EST ----- Regarding: SURGERY - Cardio clearance   ----- Message -----    From: Sherren Kerns, MD    Sent: 03/19/2014   9:26 AM      To: Vvs Charge Pool  [place] groin Aortogram bilat runoff 2nd order left external iliac  She will need pop peroneal bypass vs endarterectomy and toe amp but needs cardiac eval first.  Will get back to you on a date.  Fabienne Bruns, MD [facility] [place] Office: [phone] Pager: [phone]   03/19/14: I spoke with pt to give cards appt for 04/11/14 @ 11:30 with Dr Armanda Magic. dpm

## 2014-03-19 NOTE — Progress Notes (Signed)
Subjective:  Just back from arteriogram / tolerated hd yesterday  Objective Vital signs in last 24 hours: Filed Vitals:   03/19/14 1125 03/19/14 1130 03/19/14 1135 03/19/14 1145  BP: 108/60 110/59 110/60 114/65  Pulse: 105 105 105 106  Temp:      TempSrc:      Resp: Height:      Weight:      SpO2: 93% 92% 93% 93%   Weight change: -0.3 kg (-10.6 oz)  Physical Exam: General: alert, thin Cachetic elderly BF Nad, OX3 Heart: RRR , Gr 2/6 Hol[place] M, rub, or gallop  Lungs: decreased BS bilat., no labored breathibng  Abdomen: thin Bs Pos. SOft , Liver down 5 cm nt, nd  Extremities: No pedal edema with  Trophic changes in feet, with erythematous skin changes to Mid foot./ R 2 toe dry gangr. Dialysis Access: LUA AVF no bruit or thrill / Lonia Chimera cath  Dialysis Orders: Center: [facility] on TTS . EDW 35.5 kg HD Bath 2.0 k 2.25ca Time 3hr Heparin 3700. Access R IJ perm cath / clotted LUA AVF 9 oop plans for VVS Vein mapping 03/28/14   No esa. fe or vit d  Other op labs hgb 11.5 , ca 10.0 , pos 3.9/ pth 84  Problem/Plan:  1.  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 Darrick Penna eval with Arteriogram= occlusion anterior tibial and posterior tibial arteries bilaterally ,90% stenosis origin left peroneal artery, and one vessel runoff right peroneal with in-line flow done and Plans ( when Card Clears )  toe  Amputation and localized endarterectomy of the below-knee popliteal and peroneal artery / on iv vanco 2. ESRD - TTS HD ( [facility] op unit) hd in am  3. Hypertension/volume - yesterday hd uf to edw and looks dry / bp 114/65/coreg dc sec. to low bp 4. Anemia -  hgb 10.4>8.8> 9.8 no ESA as op/Have  Started Araensp/ 17% op tfs started Fe load in hosp.  5. Metabolic bone disease - renvela binder no vit d ( pth 84) 6. Nutrition - Malnourished use protein supplement  7. FTT- Lives by herself ?? Family assistance vs NHP at Dc  Lenny Pastel,  PA-C [facility] Beeper [phone] 03/19/2014,1:02 PM  LOS: 2 days   Pt seen, examined and agree w A/P as above.  Vinson Moselle MD pager [phone]    cell [phone] 03/19/2014, 4:35 PM    Labs: Basic Metabolic Panel:  Recent Labs Lab 03/17/14 1726 03/18/14 0600 03/19/14 0629  NA 141 140 131*  K 4.5 4.3 3.8  CL 104 102 96  CO2 GLUCOSE 126* 173* 144*  BUN 42* 48* 25*  CREATININE 3.63* 4.15* 2.65*  CALCIUM 9.0 8.6 7.9*   Liver Function Tests:  Recent Labs Lab 03/18/14 0600  AST 21  ALT 35  ALKPHOS 113  BILITOT 0.5  PROT 6.1  ALBUMIN 2.1*   CBC:  Recent Labs Lab 03/17/14 1726 03/18/14 0600 03/19/14 0629  WBC 11.6* 8.6 9.1  NEUTROABS 10.4*  --   --   HGB 10.4* 8.8* 9.8*  HCT 32.9* 28.0* 31.4*  MCV 82.3 83.6 81.3  PLT 177 171 178  CBG:  Recent Labs Lab 03/18/14 1807 03/18/14 2104 03/18/14 2301 03/19/14 0751 03/19/14 0934  GLUCAP 186* 288* 291* 157* 154*    Studies/Results:  Medications:   . [MAR Hold] aspirin EC  81 mg Oral Daily  . [MAR Hold] darbepoetin (  ARANESP) injection - DIALYSIS  200 mcg Intravenous Q Tue-HD  . [MAR Hold] feeding supplement (NEPRO CARB STEADY)  237 mL Oral TID WC  . [MAR Hold] ferric gluconate (FERRLECIT/NULECIT) IV  250 mg Intravenous Q T,Th,Sa-HD  . [MAR Hold] heparin  5,000 Units Subcutaneous 3 times per day  . [MAR Hold] insulin aspart  0-15 Units Subcutaneous TID WC  . [MAR Hold] insulin aspart  0-5 Units Subcutaneous QHS  . [MAR Hold] piperacillin-tazobactam (ZOSYN)  IV  2.25 g Intravenous 3 times per day  . [MAR Hold] pravastatin  20 mg Oral q1800  . [MAR Hold] sevelamer carbonate  800 mg Oral TID WC  . [MAR Hold] vancomycin  500 mg Intravenous Q T,Th,Sa-HD

## 2014-03-20 ENCOUNTER — Encounter (HOSPITAL_COMMUNITY): Payer: Self-pay | Admitting: Cardiology

## 2014-03-20 DIAGNOSIS — Z01818 Encounter for other preprocedural examination: Secondary | ICD-10-CM | POA: Insufficient documentation

## 2014-03-20 DIAGNOSIS — E119 Type 2 diabetes mellitus without complications: Secondary | ICD-10-CM

## 2014-03-20 DIAGNOSIS — Z01810 Encounter for preprocedural cardiovascular examination: Secondary | ICD-10-CM

## 2014-03-20 LAB — RENAL FUNCTION PANEL
ANION GAP: 9 (ref 5–15)
Albumin: 1.8 g/dL — ABNORMAL LOW (ref 3.5–5.2)
BUN: 36 mg/dL — AB (ref 6–23)
CALCIUM: 7.7 mg/dL — AB (ref 8.4–10.5)
CO2: 28 mmol/L (ref 19–32)
Chloride: 95 mmol/L — ABNORMAL LOW (ref 96–112)
Creatinine, Ser: 4.08 mg/dL — ABNORMAL HIGH (ref 0.50–1.10)
GFR calc Af Amer: 12 mL/min — ABNORMAL LOW (ref >90)
GFR calc non Af Amer: 10 mL/min — ABNORMAL LOW (ref >90)
GLUCOSE: 115 mg/dL — AB (ref 70–99)
Phosphorus: 5.2 mg/dL — ABNORMAL HIGH (ref 2.3–4.6)
Potassium: 4 mmol/L (ref 3.5–5.1)
Sodium: 132 mmol/L — ABNORMAL LOW (ref 135–145)

## 2014-03-20 LAB — CBC
HCT: 30 % — ABNORMAL LOW (ref 36.0–46.0)
Hemoglobin: 9.7 g/dL — ABNORMAL LOW (ref 12.0–15.0)
MCH: 26.5 pg (ref 26.0–34.0)
MCHC: 32.3 g/dL (ref 30.0–36.0)
MCV: 82 fL (ref 78.0–100.0)
Platelets: 175 10*3/uL (ref 150–400)
RBC: 3.66 MIL/uL — ABNORMAL LOW (ref 3.87–5.11)
RDW: 17.2 % — ABNORMAL HIGH (ref 11.5–15.5)
WBC: 11.6 10*3/uL — AB (ref 4.0–10.5)

## 2014-03-20 LAB — GLUCOSE, CAPILLARY
GLUCOSE-CAPILLARY: 225 mg/dL — AB (ref 70–99)
Glucose-Capillary: 71 mg/dL (ref 70–99)
Glucose-Capillary: 93 mg/dL (ref 70–99)
Glucose-Capillary: 262 mg/dL — ABNORMAL HIGH (ref 70–99)

## 2014-03-20 NOTE — Progress Notes (Addendum)
  Progress Note    03/20/2014 8:56 AM 1 Day Post-Op  Subjective:  Pt seen on HD-states her foot is hurting more today  Tm 99.6 now afebrile HR 90's-100's NSR 80's-120's systolic 93% RA  ABx: Vancomycin Zosyn  Filed Vitals:   03/20/14 0853  BP: 83/51  Pulse: 100  Temp:   Resp: 11    Physical Exam: Lungs:  Non labored Incisions:  Right groin is soft without hematoma Extremities:  Dry gangrene left 2nd toe   CBC    Component Value Date/Time   WBC 9.1 03/19/2014 0629   RBC 3.86* 03/19/2014 0629   RBC 3.49* 12/25/2013 0545   HGB 9.8* 03/19/2014 0629   HCT 31.4* 03/19/2014 0629   PLT 178 03/19/2014 0629   MCV 81.3 03/19/2014 0629   MCH 25.4* 03/19/2014 0629   MCHC 31.2 03/19/2014 0629   RDW 17.5* 03/19/2014 0629   LYMPHSABS 0.6* 03/17/2014 1726   MONOABS 0.6 03/17/2014 1726   EOSABS 0.1 03/17/2014 1726   BASOSABS 0.1 03/17/2014 1726    BMET    Component Value Date/Time   NA 131* 03/19/2014 0629   K 3.8 03/19/2014 0629   CL 96 03/19/2014 0629   CO2 30 03/19/2014 0629   GLUCOSE 144* 03/19/2014 0629   BUN 25* 03/19/2014 0629   CREATININE 2.65* 03/19/2014 0629   CALCIUM 7.9* 03/19/2014 0629   GFRNONAA 17* 03/19/2014 0629   GFRAA 20* 03/19/2014 0629    INR    Component Value Date/Time   INR 1.07 03/18/2014 0600     Intake/Output Summary (Last 24 hours) at 03/20/14 0856 Last data filed at 03/19/14 2030  Gross per 24 hour  Intake      0 ml  Output    250 ml  Net   -250 ml     Assessment:  69 y.o. female is s/p:  #1 occlusion anterior tibial and posterior tibial arteries bilaterally  #2 90% stenosis origin left peroneal artery  #3 one vessel runoff right peroneal with in-line flow  1 Day Post-Op  Plan: -will plan for a left tibioperoneal patch angioplasty on Monday -Dr. Darrick Penna discussed in detail the need to keep taking in the protein shakes to promote wound healing for surgery Monday as well as her sacral  decubitus.  She expresses understanding and states she is drinking them now. -WOC evaluated sacral wound-actually located closer to the left ischium.  She has a chronic stage 4 wound that measures 2.5x2.5x8cm and the wound bed is beefy red with bone palpable -DVT prophylaxis:  Heparin SQ   Doreatha Massed, PA-C [facility] [phone] 03/20/2014 8:56 AM  PE: Right groin no hematoma Stable dry gangrene toe left foot  Pt tentatively for popliteal tibial endarterectomy and toe amp Monday. Needs cardiac eval prior to this Push nutrition Continue to follow  Fabienne Bruns, MD [facility] [place] Office: [phone] Pager: [phone]

## 2014-03-20 NOTE — Progress Notes (Signed)
Subjective:  Finishing HD Today tolerated   Objective Vital signs in last 24 hours: Filed Vitals:   03/20/14 1109 03/20/14 1154 03/20/14 1237 03/20/14 1250  BP: 100/55 106/61 113/67 121/72  Pulse: 81 97 97 98  Temp:    97.7 F (36.5 C)  TempSrc:    Oral  Resp: Height:      Weight:      SpO2:    95%   Weight change: -0.212 kg (-7.5 oz) Physical Exam: General: alert, thin Cachetic elderly BF Nad, OX3 Heart: RRR , Gr 2/6 Hol[place] M, no  rub, or gallop  Lungs: decreased BS bilat., no labored breathibng  Abdomen:  SOft ,  Nt, ND Extremities: No pedal edema with Trophic changes in feet, with erythematous skin changes to Mid foot./ R 2 toe dry gangr. Dialysis Access: LUA AVF no bruit or thrill / Lonia Chimera cath  Dialysis Orders: Center: [facility] on TTS . EDW 35.5 kg HD Bath 2.0 k 2.25ca Time 3hr Heparin 3700. Access R IJ perm cath / clotted LUA AVF 9 oop plans for VVS Vein mapping 03/28/14   No esa. fe or vit d  Other op labs hgb 11.5 , ca 10.0 , pos 3.9/ pth 84  Problem/Plan:  1. 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 / on iv vanco/ zosyn 2. ESRD - TTS HD ( [facility] op unit) hd  On schedule  3. Hypertension/volume - hd uf to edw and looks dry / bp 121/72/  With coreg dc sec. to low bp/ wt pre 36.3 kg 4. Anemia - hgb 10.4>8.8> 9.8>9.7  Started 200 mcg Araensp/ 17% op tfs started Fe load in hosp.  then  weekly hd thursdays 5. Metabolic bone disease - renvela binder no vit d ( pth 84) Ca corrected= 9.4/ phos 5.2  6. Nutrition - Malnourished use protein supplement on  nepro tid  Alb 1.8 7. FTT- Lives by herself ?? Family assistance vs NHP at [facility]  Lenny Pastel,  PA-C [facility] Beeper [phone] 03/20/2014,1:09 PM  LOS: 3 days   Pt seen, examined and agree w A/P as above.  Vinson Moselle MD pager [phone]    cell [phone] 03/21/2014, 8:51 AM    Labs: Basic Metabolic Panel:  Recent Labs Lab 03/18/14 0600 03/19/14 0629 03/20/14 0840  NA 140 131* 132*  K 4.3 3.8 4.0  CL 102 96 95*  CO2 GLUCOSE 173* 144* 115*  BUN 48* 25* 36*  CREATININE 4.15* 2.65* 4.08*  CALCIUM 8.6 7.9* 7.7*  PHOS  --   --  5.2*   Liver Function Tests:  Recent Labs Lab 03/18/14 0600 03/20/14 0840  AST 21  --   ALT 35  --   ALKPHOS 113  --   BILITOT 0.5  --   PROT 6.1  --   ALBUMIN 2.1* 1.8*    Recent Labs Lab 03/17/14 1726 03/18/14 0600 03/19/14 0629 03/20/14 0840  WBC 11.6* 8.6 9.1 11.6*  NEUTROABS 10.4*  --   --   --   HGB 10.4* 8.8* 9.8* 9.7*  HCT 32.9* 28.0* 31.4* 30.0*  MCV 82.3 83.6 81.3 82.0  PLT 177 171 178 175  CBG:  Recent  Labs Lab 03/19/14 0934 03/19/14 1633 03/19/14 2127 03/20/14 0609 03/20/14 1306  GLUCAP 154* 325* 123* 71 93    Studies/Results: No results found. Medications:   . aspirin EC  81 mg Oral Daily  . darbepoetin (ARANESP) injection - DIALYSIS  200 mcg Intravenous Q Tue-HD  . feeding supplement (NEPRO CARB STEADY)  237 mL Oral TID WC  . ferric gluconate (FERRLECIT/NULECIT) IV  250 mg Intravenous Q T,Th,Sa-HD  . heparin  5,000 Units Subcutaneous 3 times per day  . insulin aspart  0-15 Units Subcutaneous TID WC  . insulin aspart  0-5 Units Subcutaneous QHS  . piperacillin-tazobactam (ZOSYN)  IV  2.25 g Intravenous 3 times per day  . pravastatin  20 mg Oral q1800  . sevelamer carbonate  800 mg Oral TID WC  . vancomycin  500 mg Intravenous Q T,Th,Sa-HD

## 2014-03-20 NOTE — Progress Notes (Signed)
[facility] PROGRESS NOTE   Elizabeth [name] ALP:[id number] DOB: [birth year] DOA: 03/17/2014 PCP: [name], MD  HPI: The patient presented with complaints of infection ofLeft foot toe. She was found to have gangrenous changes. She was subsequently admitted to the hospital.   Subjective: She denies any pain. No other complaints offered.    Assessment/Plan: Principal Problem:   Cellulitis of foot Active Problems:   HLD (hyperlipidemia)   Anemia of chronic disease   End stage renal disease   DM (diabetes mellitus) type 2, uncontrolled, with ketoacidosis   Protein-calorie malnutrition, severe   Neuropathy   Ulcer of toe of left foot   Cellulitis of Left foot/Ulcer of Toe of Left Foot -Likely due to peripheral vascular disease and diabetes history. -MRI w/o contrast of left foot showed "Devitalization of the middle phalanx of the second toe. Abnormal signal from the proximal phalanx of the second toe is most likely due being devitalized. There is no discrete destruction of the proximal phalanx. Adjacent cellulitis. The distal phalanx of the second toe is missing." -Bilateral lower extremity venous duplex showed no evidence of DVT, superficial thrombosis, or Baker's cyst. -Continue vancomycin and zosyn. Blood cultures negative so far. -Patient underwent arteriogram 1/27. Vascular surgery is planning popliteal tibial endarterectomy and toe amputation on Monday, 2/1. Cardiology consulted for preoperative cardiac evaluation.   DM Type 2, uncontrolled Continue NovoLOG on sliding scale. Monitor CBGs.  End stage renal disease TTS/Hyponatremia -Renal failure likely due to diabetes -Management per Nephrology  Hyperlipidemia -Continue pravastatin.   Hypertension -Continue carvedilol.  Anemia of Chronic disease -Hgb remains stable.  DVT Prophylaxis: Heparin Code Status: Full Code Family Communication: Plan discussed with the patient. Disposition Plan: Not ready for  discharge  Consultants:  Vascular surgery  Procedures: Arteriogram 03/19/2014 Operative findings: #1 occlusion anterior tibial and posterior tibial arteries bilaterally   #2 90% stenosis origin left peroneal artery   #3 one vessel runoff right peroneal with in-line flow  Antibiotics:  Vancomycin 1/25>>  Zosyn 1/25>>   Objective: Filed Vitals:   03/20/14 1250  BP: 121/72  Pulse: 98  Temp: 97.7 F (36.5 C)  Resp: 16    Intake/Output Summary (Last 24 hours) at 03/20/14 1312 Last data filed at 03/20/14 1250  Gross per 24 hour  Intake      0 ml  Output    250 ml  Net   -250 ml   Filed Weights   03/18/14 1744 03/19/14 0504 03/20/14 0500  Weight: 35.5 kg (78 lb 4.2 oz) 35.9 kg (79 lb 2.3 oz) 36.288 kg (80 lb)    Exam: General: Alert and awake, oriented x3, not in any acute distress, CVS: S1-S2 clear, possible murmur. Chest: clear to auscultation bilaterally, no wheezing, rales or rhonchi;  Abdomen: soft, nontender, nondistended, quiet bowel sounds, scar in hypogastric region (pt says from suprapubic catheter) Extremities: Left foot second toe is black and necrotic; left foot is tender to light palpation. Pedal pulses difficult to palpate. No focal deficits  Data Reviewed: Basic Metabolic Panel:  Recent Labs Lab 03/17/14 1726 03/18/14 0600 03/19/14 0629 03/20/14 0840  NA 141 140 131* 132*  K 4.5 4.3 3.8 4.0  CL 104 102 96 95*  CO2 26 27 30 28   GLUCOSE 126* 173* 144* 115*  BUN 42* 48* 25* 36*  CREATININE 3.63* 4.15* 2.65* 4.08*  CALCIUM 9.0 8.6 7.9* 7.7*  PHOS  --   --   --  5.2*   Liver Function Tests:  Recent Labs Lab 03/18/14 0600  03/20/14 0840  AST 21  --   ALT 35  --   ALKPHOS 113  --   BILITOT 0.5  --   PROT 6.1  --   ALBUMIN 2.1* 1.8*   CBC:  Recent Labs Lab 03/17/14 1726 03/18/14 0600 03/19/14 0629 03/20/14 0840  WBC 11.6* 8.6 9.1 11.6*  NEUTROABS 10.4*  --   --   --    HGB 10.4* 8.8* 9.8* 9.7*  HCT 32.9* 28.0* 31.4* 30.0*  MCV 82.3 83.6 81.3 82.0  PLT 177 171 178 175   CBG:  Recent Labs Lab 03/19/14 0934 03/19/14 1633 03/19/14 2127 03/20/14 0609 03/20/14 1306  GLUCAP 154* 325* 123* 71 93    Micro Recent Results (from the past 240 hour(s))  Culture, blood (single)     Status: None (Preliminary result)   Collection Time: 03/17/14  5:31 PM  Result Value Ref Range Status   Specimen Description BLOOD ARM RIGHT  Final   Special Requests BOTTLES DRAWN AEROBIC AND ANAEROBIC 5CC  Final   Culture   Final           BLOOD CULTURE RECEIVED NO GROWTH TO DATE CULTURE WILL BE HELD FOR 5 DAYS BEFORE ISSUING A FINAL NEGATIVE REPORT Performed at [facility]    Report Status PENDING  Incomplete     Studies: No results found.  Scheduled Meds: . aspirin EC  81 mg Oral Daily  . darbepoetin (ARANESP) injection - DIALYSIS  200 mcg Intravenous Q Tue-HD  . feeding supplement (NEPRO CARB STEADY)  237 mL Oral TID WC  . ferric gluconate (FERRLECIT/NULECIT) IV  250 mg Intravenous Q T,Th,Sa-HD  . heparin  5,000 Units Subcutaneous 3 times per day  . insulin aspart  0-15 Units Subcutaneous TID WC  . insulin aspart  0-5 Units Subcutaneous QHS  . piperacillin-tazobactam (ZOSYN)  IV  2.25 g Intravenous 3 times per day  . pravastatin  20 mg Oral q1800  . sevelamer carbonate  800 mg Oral TID WC  . vancomycin  500 mg Intravenous Q T,Th,Sa-HD   Continuous Infusions:    Time spent: 20 minutes    [facility] Pager [phone]    If 7PM-7AM, please contact night-coverage at [email or web address], password [facility] 03/20/2014, 1:12 PM  LOS: 3 days

## 2014-03-20 NOTE — Consult Note (Signed)
Reason for Consult: surgical clearance for popliteal tibial endarterectomy and toe amp Monday  Referring Physician: Dr. Ruta Hinds   PCP:  Elayne Snare, MD  Primary Cardiologist:Dr. Kathrynn Ducking Elizabeth [name] is an 69 y.o. female.    Chief Complaint:    HPI: 69 year old female with a history of history of Dyslipidemia, recent CVA, peripheral vascular disease, hypertension, diabetes mellitus.  The patient presented with complaints of infection of Left foot toe on 03/17/14- she has had wound on lt foot for 2 years now progressed to cellulitis/dry gangrene.    She was seen and evaluated by Dr. Oneida Alar  She is currently on Vanc/Zosyn for cellulitis.  PV angiogram was done 03/19/14 with occl of Ant. Tibial and posterior tibial arteries, 90% steonsis origin Lt peroneal artery, one vessel runoff Rt peroneal with in-line flow.     We have been asked to see to determine surgical risk as she needs  popliteal tibial endarterectomy and toe amp Monday the 1st of Feb.  She has previously had abnormal stress test in 2014 with possible prior infarction and very minimal peri-infarct ischemia.  EF 44%, no cath due to renal insuff.  She has since started hemodialysis.  I find no echo in chart.  CT of abd. Revealed extensive atherosclerosis.    Pt no chest pain, no SOB.  She does not do her housework due to fatigue.  She does live alone.  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  Pt with anemia and elevated WBC.  + hyponatremia CRP is elevated.  Also with stage 4 sacral ulcer. WOC has seen.  Past Medical History  Diagnosis Date  . Hyperlipidemia   . Urinary retention     DX NEUROGENIC BLADDER--HAS INDWELLING FOLEY CATHETER  . Arthritis   . Anemia   . CAD (coronary artery disease)   . Neuropathic pain   . Shortness of breath     WITH EXERTION  . Stroke     patient not aware.  . Foley catheter in place     for urinary retention  . Critical lower limb ischemia   . Hypertension   . Peripheral  vascular disease   . Hypoglycemia 08/19/2013  . Hypokalemia 08/19/2013  . Chronic kidney disease     PT TOLD RECENT TESTING SHOWS KIDNEY DAMAGE--IS SEEING NEPHROLOGIST.  OFFICE NOTES OBTAINED DR. MATTINGLY -"CHRONIC KIDNEY DISEASE 4 MOST LIKELY SECONDARY TO DIABETES"  . Diabetes mellitus without complication     TAKES INSULIN     Past Surgical History  Procedure Laterality Date  . No previous surgery    . Insertion of suprapubic catheter N/A 05/16/2012    Procedure: INSERTION OF SUPRAPUBIC CATHETER;  Surgeon: Alexis Frock, MD;  Location: [facility];  Service: Urology;  Laterality: N/A;  . Bascilic vein transposition Left 10/30/2012    Procedure: LEFT 1ST STAGE BASCILIC VEIN TRANSPOSITION;  Surgeon: Angelia Mould, MD;  Location: [facility] OR;  Service: Vascular;  Laterality: Left;  . Nm myocar perf wall motion  04/19/2012    low risk study-EF 44%,mild inferolateral hypkinesis  . Bascilic vein transposition Left 02/05/2013    Procedure: BASCILIC VEIN TRANSPOSITION 2ND STAGE;  Surgeon: Angelia Mould, MD;  Location: [facility];  Service: Vascular;  Laterality: Left;  . Radiology with anesthesia N/A 10/25/2013    Procedure: RADIOLOGY WITH ANESTHESIA;  Surgeon: Medication Radiologist, MD;  Location: [facility];  Service: Radiology;  Laterality: N/A;  . Fistulogram Left 04/29/2013  Procedure: FISTULOGRAM;  Surgeon: Angelia Mould, MD;  [facility] CATH LAB;  Service: Cardiovascular;  Laterality: Left;  . Angioplasty Left 04/29/2013    Procedure: ANGIOPLASTY;  Surgeon: Angelia Mould, MD;  Location: [facility] CATH LAB;  Service: Cardiovascular;  Laterality: Left;  . Abdominal aortagram N/A 03/19/2014    Procedure: ABDOMINAL Maxcine Ham;  Surgeon: Elam Dutch, MD;  Location: [facility] CATH LAB;  Service: Cardiovascular;  Laterality: N/A;    Family History  Problem Relation Age of Onset  . Hyperlipidemia Mother   . Hypertension Mother   . Hodgkin's lymphoma Father   . Heart disease Sister    Social  History:  reports that she has never smoked. She has never used smokeless tobacco. She reports that she does not drink alcohol or use illicit drugs.  Allergies: No Known Allergies  Medications Prior to Admission  Medication Sig Dispense Refill  . carvedilol (COREG) 6.25 MG tablet Take 6.25 mg by mouth 2 (two) times daily with a meal.    . glucose blood (ONETOUCH VERIO) test strip Use as instructed to check blood sugar 4 times per day dx code E13.10 150 each 3  . insulin aspart (NOVOLOG) 100 UNIT/ML injection Inject 0-12 Units into the skin 2 (two) times daily. Sliding scale: 0-149=0; 150-199=2; 200-249=4; 250-299=6; 300-349=8; 350-399=10; 400-499=12; 450> = Call MD    . multivitamin (RENA-VIT) TABS tablet Take 1 tablet by mouth daily.    Glory Rosebush DELICA LANCETS FINE MISC Use to check blood sugar 4 times per day 200 each 3  . oxyCODONE-acetaminophen (PERCOCET/ROXICET) 5-325 MG per tablet Take 1-2 tablets by mouth every 8 (eight) hours as needed for moderate pain or severe pain. 30 tablet 0  . pravastatin (PRAVACHOL) 20 MG tablet Take 20 mg by mouth daily.    . sevelamer carbonate (RENVELA) 800 MG tablet Take 1 tablet (800 mg total) by mouth 3 (three) times daily with meals.    Marland Kitchen aspirin EC 81 MG tablet Take 81 mg by mouth daily.    . cyproheptadine (PERIACTIN) 4 MG tablet Take 4 mg by mouth daily.    . Nutritional Supplements (FEEDING SUPPLEMENT, NEPRO CARB STEADY,) LIQD Take 237 mLs by mouth as needed (missed meal during dialysis.). (Patient taking differently: Take 237 mLs by mouth daily. ) 30 Can 0  . Nutritional Supplements (FEEDING SUPPLEMENT, NEPRO CARB STEADY,) LIQD Take 237 mLs by mouth 3 (three) times daily between meals.  0  . promethazine (PHENERGAN) 12.5 MG tablet Take 1 tablet (12.5 mg total) by mouth every 6 (six) hours as needed for nausea or vomiting. 30 tablet 0    Results for orders placed or performed during the hospital encounter of 03/17/14 (from the past 48 hour(s))   Hepatitis B surface antigen     Status: None   Collection Time: 03/18/14  2:24 PM  Result Value Ref Range   Hepatitis B Surface Ag NEGATIVE NEGATIVE    Comment: Performed at [facility]  Glucose, capillary     Status: Abnormal   Collection Time: 03/18/14  6:07 PM  Result Value Ref Range   Glucose-Capillary 186 (H) 70 - 99 mg/dL  Glucose, capillary     Status: Abnormal   Collection Time: 03/18/14  9:04 PM  Result Value Ref Range   Glucose-Capillary 288 (H) 70 - 99 mg/dL  Glucose, capillary     Status: Abnormal   Collection Time: 03/18/14 11:01 PM  Result Value Ref Range   Glucose-Capillary 291 (H) 70 - 99  mg/dL  Basic metabolic panel     Status: Abnormal   Collection Time: 03/19/14  6:29 AM  Result Value Ref Range   Sodium 131 (L) 135 - 145 mmol/L   Potassium 3.8 3.5 - 5.1 mmol/L   Chloride 96 96 - 112 mmol/L   CO2 30 19 - 32 mmol/L   Glucose, Bld 144 (H) 70 - 99 mg/dL   BUN 25 (H) 6 - 23 mg/dL    Comment: DELTA CHECK NOTED   Creatinine, Ser 2.65 (H) 0.50 - 1.10 mg/dL    Comment: DELTA CHECK NOTED   Calcium 7.9 (L) 8.4 - 10.5 mg/dL   GFR calc non Af Amer 17 (L) >90 mL/min   GFR calc Af Amer 20 (L) >90 mL/min    Comment: (NOTE) The eGFR has been calculated using the CKD EPI equation. This calculation has not been validated in all clinical situations. eGFR's persistently <90 mL/min signify possible Chronic Kidney Disease.    Anion gap 5 5 - 15  CBC     Status: Abnormal   Collection Time: 03/19/14  6:29 AM  Result Value Ref Range   WBC 9.1 4.0 - 10.5 K/uL   RBC 3.86 (L) 3.87 - 5.11 MIL/uL   Hemoglobin 9.8 (L) 12.0 - 15.0 g/dL   HCT 31.4 (L) 36.0 - 46.0 %   MCV 81.3 78.0 - 100.0 fL   MCH 25.4 (L) 26.0 - 34.0 pg   MCHC 31.2 30.0 - 36.0 g/dL   RDW 17.5 (H) 11.5 - 15.5 %   Platelets 178 150 - 400 K/uL  Glucose, capillary     Status: Abnormal   Collection Time: 03/19/14  7:51 AM  Result Value Ref Range   Glucose-Capillary 157 (H) 70 - 99 mg/dL  Glucose,  capillary     Status: Abnormal   Collection Time: 03/19/14  9:34 AM  Result Value Ref Range   Glucose-Capillary 154 (H) 70 - 99 mg/dL  Glucose, capillary     Status: Abnormal   Collection Time: 03/19/14  4:33 PM  Result Value Ref Range   Glucose-Capillary 325 (H) 70 - 99 mg/dL   Comment 1 Documented in Chart    Comment 2 Notify RN   Glucose, capillary     Status: Abnormal   Collection Time: 03/19/14  9:27 PM  Result Value Ref Range   Glucose-Capillary 123 (H) 70 - 99 mg/dL  Glucose, capillary     Status: None   Collection Time: 03/20/14  6:09 AM  Result Value Ref Range   Glucose-Capillary 71 70 - 99 mg/dL   Comment 1 Documented in Chart    Comment 2 Notify RN   CBC     Status: Abnormal   Collection Time: 03/20/14  8:40 AM  Result Value Ref Range   WBC 11.6 (H) 4.0 - 10.5 K/uL   RBC 3.66 (L) 3.87 - 5.11 MIL/uL   Hemoglobin 9.7 (L) 12.0 - 15.0 g/dL   HCT 30.0 (L) 36.0 - 46.0 %   MCV 82.0 78.0 - 100.0 fL   MCH 26.5 26.0 - 34.0 pg   MCHC 32.3 30.0 - 36.0 g/dL   RDW 17.2 (H) 11.5 - 15.5 %   Platelets 175 150 - 400 K/uL  Renal function panel     Status: Abnormal   Collection Time: 03/20/14  8:40 AM  Result Value Ref Range   Sodium 132 (L) 135 - 145 mmol/L   Potassium 4.0 3.5 - 5.1 mmol/L   Chloride 95 (L) 96 -  112 mmol/L   CO2 28 19 - 32 mmol/L   Glucose, Bld 115 (H) 70 - 99 mg/dL   BUN 36 (H) 6 - 23 mg/dL   Creatinine, Ser 4.08 (H) 0.50 - 1.10 mg/dL    Comment: DELTA CHECK NOTED   Calcium 7.7 (L) 8.4 - 10.5 mg/dL   Phosphorus 5.2 (H) 2.3 - 4.6 mg/dL   Albumin 1.8 (L) 3.5 - 5.2 g/dL   GFR calc non Af Amer 10 (L) >90 mL/min   GFR calc Af Amer 12 (L) >90 mL/min    Comment: (NOTE) The eGFR has been calculated using the CKD EPI equation. This calculation has not been validated in all clinical situations. eGFR's persistently <90 mL/min signify possible Chronic Kidney Disease.    Anion gap 9 5 - 15   No results found.  ROS: General:no colds or fevers, low weight   Skin:no rashes + sacral ulcer + gangrene of Lt great toe HEENT:no blurred vision, no congestion CV:see HPI PUL:see HPI GI:no diarrhea constipation or melena, no indigestion GU:no hematuria, no dysuria MS:no joint pain, no claudication, + toe pain Neuro:no syncope, no lightheadedness Endo:+ diabetes, no thyroid disease   Blood pressure 113/67, pulse 97, temperature 97.4 F (36.3 C), temperature source Oral, resp. rate 22, height 5' 3"  (1.6 m), weight 80 lb (36.288 kg), SpO2 92 %.  Wt Readings from Last 3 Encounters:  03/20/14 80 lb (36.288 kg)  02/18/14 86 lb (39.009 kg)  12/28/13 82 lb 14.3 oz (37.6 kg)    PE: General:Pleasant affect, NAD, thin Skin:Warm and dry, brisk capillary refill HEENT:normocephalic, sclera clear, mucus membranes moist Neck:supple, no JVD, no bruits , cath rt neck Heart:S1S2 RRR without murmur, gallup, rub or click Lungs:clear without rales, rhonchi, or wheezes HGD:JMEQ, non tender, + BS, do not palpate liver spleen or masses AST:MHDQ lower ext edema at ankles and feet, 2+ radial pulses, Lt second toe with gangrene  Neuro:alert and oriented X 3, MAE, follows commands, + facial symmetry  TELE:  SR  Assessment/Plan Principal Problem:   Cellulitis of foot on zosyn and vanc Active Problems:   HLD (hyperlipidemia) on pravachol, no recent lipid panel   Anemia of chronic disease   End stage renal disease on HD T,Th, Sat   DM (diabetes mellitus) type 2, uncontrolled, with ketoacidosis   Protein-calorie malnutrition, severe   Neuropathy   Ulcer of toe of left foot and sacral ulcer    Encounter for preoperative examination for general surgical procedure, vascular    --check EKG, check Echo with hx of EF 44% on nuc.  + risk factors with diabetes and hyperlipidemia and atherosclerosis of arteries on CT of abd and pelvis..  MD to eval possible lexiscan myoview tomorrow- surgery is planned for Monday     [facility]  Nurse Practitioner [employer] Pager [phone] or after 5pm or weekends call [phone] 03/20/2014, 12:46 PM   As above, patient seen and examined. Briefly she is a 69 year old female with past medical history of presumed coronary disease the cause of abnormal nuclear study, end-stage renal disease, peripheral vascular disease, prior stroke, diabetes mellitus for preoperative evaluation prior to peripheral vascular surgery. Patient had a nuclear study 2 years ago that showed ejection fraction 44%, inferior lateral infarct with minimal peri-infarct ischemia. Patient has been admitted with complaints of infected left foot. She is being treated with vancomycin and Zosyn for cellulitis. Arteriogram reveals occlusion of the anterior tibial and posterior tibial arteries and 90% stenosis  at the origin of the left peroneal artery. Popliteal tibial endarterectomy and toe amputation planned for Monday. Cardiology is asked to evaluate preoperatively.  Patient has very limited mobility but does walk limited amounts in the house but overall poor functional status. She denies dyspnea, chest pain or syncope. She is extremely frail. She has multiple risk factors. I will plan Lexiscan nuclear study for risk stratification. If no ischemia and she may proceed with surgery realizing there is some increased risk based on age, frail body habitus and end-stage renal disease. Will check echocardiogram for LV function. Continue aspirin and statin.

## 2014-03-20 NOTE — Progress Notes (Signed)
ANTIBIOTIC CONSULT NOTE - FOLLOW UP  Pharmacy Consult for vancomycin, Zosyn Indication: cellulitis/dry-gangrene of L-toe  No Known Allergies Patient Measurements: Height: 5\' 3"  (160 cm) Weight: 80 lb (36.288 kg) IBW/kg (Calculated) : 52.4 Vital Signs: Temp: 97.4 F (36.3 C) (01/28 0829) Temp Source: Oral (01/28 0829) BP: 106/61 mmHg (01/28 1154) Pulse Rate: 97 (01/28 1154) Labs:  Recent Labs  03/18/14 0600 03/19/14 0629 03/20/14 0840  WBC 8.6 9.1 11.6*  HGB 8.8* 9.8* 9.7*  PLT 171 178 175  CREATININE 4.15* 2.65* 4.08*   Estimated Creatinine Clearance: 7.4 mL/min (by C-G formula based on Cr of 4.08). No results for input(s): VANCOTROUGH, VANCOPEAK, VANCORANDOM, GENTTROUGH, GENTPEAK, GENTRANDOM, TOBRATROUGH, TOBRAPEAK, TOBRARND, AMIKACINPEAK, AMIKACINTROU, AMIKACIN in the last 72 hours.   Microbiology: Recent Results (from the past 720 hour(s))  Culture, blood (single)     Status: None (Preliminary result)   Collection Time: 03/17/14  5:31 PM  Result Value Ref Range Status   Specimen Description BLOOD ARM RIGHT  Final   Special Requests BOTTLES DRAWN AEROBIC AND ANAEROBIC 5CC  Final   Culture   Final           BLOOD CULTURE RECEIVED NO GROWTH TO DATE CULTURE WILL BE HELD FOR 5 DAYS BEFORE ISSUING A FINAL NEGATIVE REPORT Performed at [facility]    Report Status PENDING  Incomplete    Anti-infectives    Start     Dose/Rate Route Frequency Ordered Stop   03/18/14 2030  piperacillin-tazobactam (ZOSYN) IVPB 2.25 g  Status:  Discontinued     2.25 g100 mL/hr over 30 Minutes Intravenous 4 times per day 03/17/14 2056 03/17/14 2100   03/18/14 1200  vancomycin (VANCOCIN) 500 mg in sodium chloride 0.9 % 100 mL IVPB     500 mg100 mL/hr over 60 Minutes Intravenous Every T-Th-Sa (Hemodialysis) 03/17/14 2006     03/17/14 2230  piperacillin-tazobactam (ZOSYN) IVPB 2.25 g     2.25 g100 mL/hr over 30 Minutes Intravenous 3 times per day 03/17/14 2223     03/17/14 2130   vancomycin (VANCOCIN) IVPB 750 mg/150 ml premix     750 mg150 mL/hr over 60 Minutes Intravenous  Once 03/17/14 2109 03/17/14 2350   03/17/14 2030  piperacillin-tazobactam (ZOSYN) IVPB 2.25 g  Status:  Discontinued     2.25 g100 mL/hr over 30 Minutes Intravenous 3 times per day 03/17/14 2006 03/17/14 2056   03/17/14 2030  piperacillin-tazobactam (ZOSYN) IVPB 2.25 g  Status:  Discontinued     2.25 g100 mL/hr over 30 Minutes Intravenous 3 times per day 03/17/14 2100 03/17/14 2223     Assessment: 69 year old female with ESRD- HD Tuesday/Thursday/Saturday on day#4 of vancomycin and Zosyn for cellulitis. Last HD on 1/26- patient tolerated full session. Next HD today for planned 4 hours. WBC up slightly at 11.6. Patient remains afebrile.   Cultures (blood)- no growth to date.  Tentative plan for popliteal tibial endarterectomy and toe amputation on Monday per Vascular Surgery.  Goal of Therapy:  Target pre-HD level 15 - 25 mcg/mL Target post-HD level 5 - 15 mcg/mL  Plan:  Continue vancomycin 500mg  post-HD T/Th/Sat Continue Zosyn 2.25g IV every 8 hours.  Follow-up clinical status, toleration of HD, and need for HD levels.  Follow-up plan for duration of therapy or ability to narrow.   Elizabeth [name], PharmD, BCPS Clinical Pharmacist [phone] 03/20/2014,12:17 PM

## 2014-03-21 ENCOUNTER — Inpatient Hospital Stay (HOSPITAL_COMMUNITY): Payer: Medicare Other

## 2014-03-21 ENCOUNTER — Telehealth: Payer: Self-pay | Admitting: Vascular Surgery

## 2014-03-21 DIAGNOSIS — E785 Hyperlipidemia, unspecified: Secondary | ICD-10-CM

## 2014-03-21 DIAGNOSIS — N186 End stage renal disease: Secondary | ICD-10-CM

## 2014-03-21 DIAGNOSIS — D638 Anemia in other chronic diseases classified elsewhere: Secondary | ICD-10-CM

## 2014-03-21 DIAGNOSIS — Z01818 Encounter for other preprocedural examination: Secondary | ICD-10-CM

## 2014-03-21 DIAGNOSIS — L03119 Cellulitis of unspecified part of limb: Secondary | ICD-10-CM

## 2014-03-21 DIAGNOSIS — I96 Gangrene, not elsewhere classified: Secondary | ICD-10-CM

## 2014-03-21 DIAGNOSIS — E1129 Type 2 diabetes mellitus with other diabetic kidney complication: Secondary | ICD-10-CM

## 2014-03-21 DIAGNOSIS — R079 Chest pain, unspecified: Secondary | ICD-10-CM

## 2014-03-21 DIAGNOSIS — I959 Hypotension, unspecified: Secondary | ICD-10-CM

## 2014-03-21 LAB — LIPID PANEL
Cholesterol: 152 mg/dL (ref 0–200)
HDL: 54 mg/dL (ref >39)
LDL CALC: 76 mg/dL (ref 0–99)
TRIGLYCERIDES: 109 mg/dL (ref <150)
Total CHOL/HDL Ratio: 2.8 RATIO
VLDL: 22 mg/dL (ref 0–40)

## 2014-03-21 LAB — CBC
HCT: 30.2 % — ABNORMAL LOW (ref 36.0–46.0)
HEMOGLOBIN: 9.4 g/dL — AB (ref 12.0–15.0)
MCH: 25.4 pg — ABNORMAL LOW (ref 26.0–34.0)
MCHC: 31.1 g/dL (ref 30.0–36.0)
MCV: 81.6 fL (ref 78.0–100.0)
PLATELETS: 176 10*3/uL (ref 150–400)
RBC: 3.7 MIL/uL — AB (ref 3.87–5.11)
RDW: 17.3 % — AB (ref 11.5–15.5)
WBC: 10.4 10*3/uL (ref 4.0–10.5)

## 2014-03-21 LAB — GLUCOSE, CAPILLARY
GLUCOSE-CAPILLARY: 159 mg/dL — AB (ref 70–99)
GLUCOSE-CAPILLARY: 166 mg/dL — AB (ref 70–99)
Glucose-Capillary: 247 mg/dL — ABNORMAL HIGH (ref 70–99)
Glucose-Capillary: 228 mg/dL — ABNORMAL HIGH (ref 70–99)

## 2014-03-21 LAB — MRSA PCR SCREENING: MRSA by PCR: NEGATIVE

## 2014-03-21 MED ORDER — SODIUM CHLORIDE 0.9 % IV BOLUS (SEPSIS): 250.0000 mL | Freq: Once | INTRAVENOUS | Status: AC

## 2014-03-21 MED ORDER — TECHNETIUM TC 99M SESTAMIBI GENERIC - CARDIOLITE
10.0000 | Freq: Once | INTRAVENOUS | Status: AC | PRN
  Administered 2014-03-21: 10 via INTRAVENOUS

## 2014-03-21 MED ORDER — REGADENOSON 0.4 MG/5ML IV SOLN
0.4000 mg | Freq: Once | INTRAVENOUS | Status: AC
  Administered 2014-03-21: 0.4 mg via INTRAVENOUS
  Filled 2014-03-21: qty 5

## 2014-03-21 MED ORDER — TECHNETIUM TC 99M SESTAMIBI GENERIC - CARDIOLITE
30.0000 | Freq: Once | INTRAVENOUS | Status: AC | PRN
  Administered 2014-03-21: 30 via INTRAVENOUS

## 2014-03-21 MED ORDER — SODIUM CHLORIDE 0.9 % IV SOLN: INTRAVENOUS | Status: AC

## 2014-03-21 MED ORDER — REGADENOSON 0.4 MG/5ML IV SOLN
INTRAVENOUS | Status: AC
  Filled 2014-03-21: qty 5

## 2014-03-21 NOTE — Telephone Encounter (Signed)
Spoke with Rosey Bath at [facility] to cancel, dpm

## 2014-03-21 NOTE — Progress Notes (Signed)
Subjective:  No complaints, getting a bath  Objective Vital signs in last 24 hours: Filed Vitals:   03/21/14 1031 03/21/14 1033 03/21/14 1104 03/21/14 1230  BP: 97/58  Pulse: 96 94  85  Temp:      TempSrc:      Resp:      Height:      Weight:      SpO2:       Weight change:  Physical Exam: General: alert, thin Cachetic elderly BF Nad, OX3 Heart: RRR , Gr 2/6 Hol[place] M, no  rub, or gallop  Lungs: clear BS bilat., no labored breathibng  Abdomen:  SOft ,  Nt, ND Extremities: No pedal edema with Trophic changes in feet, with erythematous skin changes to Mid foot./ R 2 toe dry gangr. Dialysis Access: LUA AVF no bruit or thrill / R IJ Perm cath  HD: TTS [facility] 3.5h    35.5kg    2/2.25 Bath  Heparin 3700     R IJ perm cath (clotted LUA AVF plans for VVS vein mapping 03/28/14) No meds Labs hgb 11.5 , ca 10.0 , pos 3.9/ pth 84  Assessment: 1. 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 / on iv vanco/ zosyn 2. ESRD - TTS HD ( [facility] op unit) hd  On schedule  3. Hypertension/volume - BP's low , holding coreg, at dry wt, got fluid today for hypotension and tolerated well 4. Anemia - started 200 mcg Araensp/ 17% op tfs started Fe load 5. Metabolic bone disease - renvela binder no vit d ( pth 84) Ca corrected= 9.4/ phos 5.2  6. Nutrition - Malnourished use protein supplement on  nepro tid  Alb 1.8 7. FTT- Lives by herself ?? Family assistance vs NHP at [place]  Plan - HD tomorrow, no fluid off   Vinson Moselle MD pager [phone]    cell [phone] 03/21/2014, 12:45 PM    Labs: Basic Metabolic Panel:  Recent Labs Lab 03/18/14 0600 03/19/14 0629 03/20/14 0840  NA 140 131* 132*  K  4.3 3.8 4.0  CL 102 96 95*  CO2 GLUCOSE 173* 144* 115*  BUN 48* 25* 36*  CREATININE 4.15* 2.65* 4.08*  CALCIUM 8.6 7.9* 7.7*  PHOS  --   --  5.2*   Liver Function Tests:  Recent Labs Lab 03/18/14 0600 03/20/14 0840  AST 21  --   ALT 35  --   ALKPHOS 113  --   BILITOT 0.5  --   PROT 6.1  --   ALBUMIN 2.1* 1.8*    Recent Labs Lab 03/17/14 1726 03/18/14 0600 03/19/14 0629 03/20/14 0840  WBC 11.6* 8.6 9.1 11.6*  NEUTROABS 10.4*  --   --   --   HGB 10.4* 8.8* 9.8* 9.7*  HCT 32.9* 28.0* 31.4* 30.0*  MCV 82.3 83.6 81.3 82.0  PLT 177 171 178 175  CBG:  Recent Labs Lab 03/20/14 0609 03/20/14 1306 03/20/14 1658 03/20/14 2140 03/21/14 0556  GLUCAP 71 93 262* 225* 159*    Studies/Results: No results found. Medications: . sodium chloride     . aspirin EC  81 mg Oral Daily  . darbepoetin (ARANESP) injection - DIALYSIS  200 mcg Intravenous Q Tue-HD  .  feeding supplement (NEPRO CARB STEADY)  237 mL Oral TID WC  . ferric gluconate (FERRLECIT/NULECIT) IV  250 mg Intravenous Q T,Th,Sa-HD  . heparin  5,000 Units Subcutaneous 3 times per day  . insulin aspart  0-15 Units Subcutaneous TID WC  . insulin aspart  0-5 Units Subcutaneous QHS  . piperacillin-tazobactam (ZOSYN)  IV  2.25 g Intravenous 3 times per day  . pravastatin  20 mg Oral q1800  . regadenoson      . sevelamer carbonate  800 mg Oral TID WC  . sodium chloride  250 mL Intravenous Once  . vancomycin  500 mg Intravenous Q T,Th,Sa-HD

## 2014-03-21 NOTE — Progress Notes (Signed)
NUTRITION FOLLOW UP / CONSULT  Pt meets criteria for SEVERE MALNUTRITION in the context of chronic illness as evidenced by a 5.8% weight loss in 1 month and severe fat and muscle mass loss.  DOCUMENTATION CODES Per approved criteria  -Severe malnutrition in the context of chronic illness -Underweight    Intervention:    Continue Nepro Shake po TID, each supplement provides 425 kcal and 19 grams protein.  Encourage adequate PO intake.   Nutrition Dx:   Increased nutrient needs related to chronic illness, ESRD as evidenced by estimated nutrition needs. Ongoing.   Goal:   Intake to meet >90% of estimated nutrition needs.  Monitor:   PO intake, labs, weight trend.  Assessment:   Pt with Past medical history of Dyslipidemia, recent CVA, peripheral vascular disease, hypertension, diabetes mellitus. The patient presented with complaints of infection ofLeft foot toe.  Patient not in room at time of RD visit. Currently NPO for lexiscan myoview today. Per documentation of intake, patient has been consuming 75% of renal diet meals. Per RD discussion with patient 3 days ago, patient was eating well at home. She is underweight with BMI=14.8. Receiving Nepro Shakes TID between meals to maximize oral intake.  Height: Ht Readings from Last 1 Encounters:  03/17/14 5\' 3"  (1.6 m)    Weight Status:   Wt Readings from Last 1 Encounters:  03/20/14 80 lb (36.288 kg)    Body mass index is 14.18 kg/(m^2).   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 aspirin EC  81 mg Oral Daily  . darbepoetin (ARANESP) injection - DIALYSIS  200 mcg Intravenous Q Tue-HD  . feeding supplement (NEPRO CARB STEADY)  237 mL Oral TID WC  . ferric gluconate (FERRLECIT/NULECIT) IV  250 mg Intravenous Q T,Th,Sa-HD  . heparin  5,000 Units Subcutaneous 3 times per day  . insulin aspart  0-15 Units Subcutaneous TID WC  . insulin aspart  0-5 Units Subcutaneous QHS  . piperacillin-tazobactam (ZOSYN)  IV  2.25 g Intravenous 3 times per day  . pravastatin  20 mg Oral q1800  . regadenoson      . sevelamer carbonate  800 mg Oral TID WC  . vancomycin  500 mg Intravenous Q T,Th,Sa-HD    Continuous Infusions:    Joaquin Courts, RD, LDN, CNSC Pager [phone] After Hours Pager [phone]

## 2014-03-21 NOTE — Progress Notes (Addendum)
  Progress Note    03/21/2014 7:44 AM 2 Days Post-Op  Subjective:  Sleeping - awakes to voice and answers questions  Tm 99.9 now afebrile 80's-90's systolic HR 90's NSR 96% RA   Filed Vitals:   03/21/14 0500  BP: 93/58  Pulse: 91  Temp: 98 F (36.7 C)  Resp: 18      CBC    Component Value Date/Time   WBC 11.6* 03/20/2014 0840   RBC 3.66* 03/20/2014 0840   RBC 3.49* 12/25/2013 0545   HGB 9.7* 03/20/2014 0840   HCT 30.0* 03/20/2014 0840   PLT 175 03/20/2014 0840   MCV 82.0 03/20/2014 0840   MCH 26.5 03/20/2014 0840   MCHC 32.3 03/20/2014 0840   RDW 17.2* 03/20/2014 0840   LYMPHSABS 0.6* 03/17/2014 1726   MONOABS 0.6 03/17/2014 1726   EOSABS 0.1 03/17/2014 1726   BASOSABS 0.1 03/17/2014 1726    BMET    Component Value Date/Time   NA 132* 03/20/2014 0840   K 4.0 03/20/2014 0840   CL 95* 03/20/2014 0840   CO2 28 03/20/2014 0840   GLUCOSE 115* 03/20/2014 0840   BUN 36* 03/20/2014 0840   CREATININE 4.08* 03/20/2014 0840   CALCIUM 7.7* 03/20/2014 0840   GFRNONAA 10* 03/20/2014 0840   GFRAA 12* 03/20/2014 0840    INR    Component Value Date/Time   INR 1.07 03/18/2014 0600     Intake/Output Summary (Last 24 hours) at 03/21/14 0744 Last data filed at 03/21/14 0500  Gross per 24 hour  Intake    100 ml  Output    100 ml  Net      0 ml     Assessment:  69 y.o. female is s/p:  #1 occlusion anterior tibial and posterior tibial arteries bilaterally  #2 90% stenosis origin left peroneal artery  #3 one vessel runoff right peroneal with in-line flow  2 Days Post-Op  Plan: -will plan for a left tibioperoneal patch angioplasty on Monday based on the cardiac workup -continue increasing intake and protein shakes to improve nutrition -DVT prophylaxis:  Heparin SQ   Doreatha Massed, PA-C [facility] [phone] 03/21/2014 7:44 AM   History and exam as above Endarterectomy and toe amp Monday unless  she does poorly on stress test  In that case will do toe amp only Dr Myra Gianotti on call this weekend if questions  Fabienne Bruns, MD [facility] [place] Office: [phone] Pager: [phone]

## 2014-03-21 NOTE — Progress Notes (Signed)
[facility] PROGRESS NOTE   Elizabeth [name] YQM:[id number] DOB: June 09, 1945 DOA: 03/17/2014 PCP: [name], MD  HPI: The patient presented with complaints of infection ofLeft foot toe. She was found to have gangrenous changes. She was subsequently admitted to the hospital. Vascular surgery was consulted. She underwent arteriogram. Plan is for surgical intervention if cleared by cardiology.  Subjective: She underwent stress test this morning. She got a little lightheaded and was found to have low blood pressure. She was given fluids and now she feels better. Denies any other complaints. Appetite remains poor.   Assessment/Plan: Principal Problem:   Cellulitis of foot Active Problems:   HLD (hyperlipidemia)   Anemia of chronic disease   End stage renal disease   Protein-calorie malnutrition, severe   Neuropathy   Ulcer of toe of left foot   Encounter for preoperative examination for general surgical procedure, vascular   Preop cardiovascular exam   DM type 2 (diabetes mellitus, type 2)   Cellulitis of Left foot/Ulcer of Toe of Left Foot -Likely due to peripheral vascular disease and diabetes history. -MRI w/o contrast of left foot showed "Devitalization of the middle phalanx of the second toe. Abnormal signal from the proximal phalanx of the second toe is most likely due being devitalized. There is no discrete destruction of the proximal phalanx. Adjacent cellulitis. The distal phalanx of the second toe is missing." -Bilateral lower extremity venous duplex showed no evidence of DVT, superficial thrombosis, or Baker's cyst. -Continue vancomycin and zosyn. Blood cultures negative so far. -Patient underwent arteriogram 1/27. Vascular surgery is planning popliteal tibial endarterectomy and toe amputation on Monday, 2/1. Cardiology consulted for preoperative cardiac evaluation. Stress test was done this morning. Results are pending.  DM Type 2, uncontrolled Continue NovoLOG on  sliding scale. Monitor CBGs.  End stage renal disease TTS/Hyponatremia -Renal failure likely due to diabetes -Management per Nephrology. Nephrologist notified regarding her low blood pressure. They will take this into consideration when she is dialyzed tomorrow. She is apparently at her dry weight.  Hyperlipidemia -Continue pravastatin.   History of essential Hypertension Blood pressure has been running low. Beta blocker has been stopped. We will discontinue other agents including PRN agents. Patient is asymptomatic currently. Blood pressure has improved with fluids. Monitor for now. Check CBC.  Anemia of Chronic disease -Hgb remains stable.  DVT Prophylaxis: Heparin Code Status: Full Code Family Communication: Plan discussed with the patient. Disposition Plan: Not ready for discharge  Consultants:  Vascular surgery  Nephrology  Cardiology  Procedures: Arteriogram 03/19/2014 Operative findings: #1 occlusion anterior tibial and posterior tibial arteries bilaterally   #2 90% stenosis origin left peroneal artery   #3 one vessel runoff right peroneal with in-line flow  Nuclear stress test 1/29 Pending  Antibiotics:  Vancomycin 1/25>>  Zosyn 1/25>>   Objective: Filed Vitals:   03/21/14 1304  BP: 98/57  Pulse: 84  Temp: 98.1 F (36.7 C)  Resp: 16    Intake/Output Summary (Last 24 hours) at 03/21/14 1325 Last data filed at 03/21/14 0500  Gross per 24 hour  Intake    100 ml  Output    100 ml  Net      0 ml   Filed Weights   03/18/14 1744 03/19/14 0504 03/20/14 0500  Weight: 35.5 kg (78 lb 4.2 oz) 35.9 kg (79 lb 2.3 oz) 36.288 kg (80 lb)    Exam: General: Alert and awake, oriented x3, not in any acute distress, CVS: S1-S2 clear, possible murmur. Chest: clear to auscultation bilaterally,  no wheezing, rales or rhonchi;  Abdomen: soft, nontender, nondistended, quiet bowel sounds, scar in  hypogastric region (pt says from suprapubic catheter) Extremities: Left foot second toe is black and necrotic; left foot is tender to light palpation. Pedal pulses difficult to palpate. No focal deficits  Data Reviewed: Basic Metabolic Panel:  Recent Labs Lab 03/17/14 1726 03/18/14 0600 03/19/14 0629 03/20/14 0840  NA 141 140 131* 132*  K 4.5 4.3 3.8 4.0  CL 104 102 96 95*  CO2 GLUCOSE 126* 173* 144* 115*  BUN 42* 48* 25* 36*  CREATININE 3.63* 4.15* 2.65* 4.08*  CALCIUM 9.0 8.6 7.9* 7.7*  PHOS  --   --   --  5.2*   Liver Function Tests:  Recent Labs Lab 03/18/14 0600 03/20/14 0840  AST 21  --   ALT 35  --   ALKPHOS 113  --   BILITOT 0.5  --   PROT 6.1  --   ALBUMIN 2.1* 1.8*   CBC:  Recent Labs Lab 03/17/14 1726 03/18/14 0600 03/19/14 0629 03/20/14 0840  WBC 11.6* 8.6 9.1 11.6*  NEUTROABS 10.4*  --   --   --   HGB 10.4* 8.8* 9.8* 9.7*  HCT 32.9* 28.0* 31.4* 30.0*  MCV 82.3 83.6 81.3 82.0  PLT 177 171 178 175   CBG:  Recent Labs Lab 03/20/14 0609 03/20/14 1306 03/20/14 1658 03/20/14 2140 03/21/14 0556  GLUCAP 71 93 262* 225* 159*    Micro Recent Results (from the past 240 hour(s))  Culture, blood (single)     Status: None (Preliminary result)   Collection Time: 03/17/14  5:31 PM  Result Value Ref Range Status   Specimen Description BLOOD ARM RIGHT  Final   Special Requests BOTTLES DRAWN AEROBIC AND ANAEROBIC 5CC  Final   Culture   Final           BLOOD CULTURE RECEIVED NO GROWTH TO DATE CULTURE WILL BE HELD FOR 5 DAYS BEFORE ISSUING A FINAL NEGATIVE REPORT Performed at [facility]    Report Status PENDING  Incomplete  MRSA PCR Screening     Status: None   Collection Time: 03/21/14  7:23 AM  Result Value Ref Range Status   MRSA by PCR NEGATIVE NEGATIVE Final    Comment:        The GeneXpert MRSA Assay (FDA approved for NASAL specimens only), is one component of a comprehensive MRSA colonization surveillance  program. It is not intended to diagnose MRSA infection nor to guide or monitor treatment for MRSA infections.      Studies: No results found.  Scheduled Meds: . aspirin EC  81 mg Oral Daily  . darbepoetin (ARANESP) injection - DIALYSIS  200 mcg Intravenous Q Tue-HD  . feeding supplement (NEPRO CARB STEADY)  237 mL Oral TID WC  . ferric gluconate (FERRLECIT/NULECIT) IV  250 mg Intravenous Q T,Th,Sa-HD  . heparin  5,000 Units Subcutaneous 3 times per day  . insulin aspart  0-15 Units Subcutaneous TID WC  . insulin aspart  0-5 Units Subcutaneous QHS  . piperacillin-tazobactam (ZOSYN)  IV  2.25 g Intravenous 3 times per day  . pravastatin  20 mg Oral q1800  . regadenoson      . sevelamer carbonate  800 mg Oral TID WC  . sodium chloride  250 mL Intravenous Once  . vancomycin  500 mg Intravenous Q T,Th,Sa-HD   Continuous Infusions: . sodium chloride       Time  spent: 20 minutes    [facility] Pager [phone]    If 7PM-7AM, please contact night-coverage at [email or web address], password [facility] 03/21/2014, 1:25 PM  LOS: 4 days

## 2014-03-21 NOTE — Telephone Encounter (Signed)
-----   Message from Lear Ng, RN sent at 03/20/2014  4:51 PM EST ----- Regarding: RE: SURGERY - Cardio clearance Annabelle Harman,  Ms. Huss is an inpatient at [facility] Dr. Jens Som consulted on her today and is planning on a lexiscan tomorrow. So, we'll have to cancel that appt. for 04/11/14. Thanks!  Judeth Cornfield  ----- Message -----    From: Fredrich Birks    Sent: 03/19/2014   3:47 PM      To: Gayleen Orem Dehart, RN Subject: FW: SURGERY - Cardio clearance                 Judeth Cornfield,  I have scheduled Ms Petrosyan to see Dr Mayford Knife with [facility] on 04/11/14 @ 11:30. Patient is aware. According to CEF's note below, he will get back to [place] about surgery date.  Thanks! Annabelle Harman ----- Message -----    From: Sharee Pimple, RN    Sent: 03/19/2014  11:40 AM      To: Gayleen Orem Dehart, RN, # Subject: SURGERY - Cardio clearance                       ----- Message -----    From: Sherren Kerns, MD    Sent: 03/19/2014   9:26 AM      To: Vvs Charge Pool  [place] groin Aortogram bilat runoff 2nd order left external iliac  She will need pop peroneal bypass vs endarterectomy and toe amp but needs cardiac eval first.  Will get back to you on a date.  Fabienne Bruns, MD [facility] [place] Office: [phone] Pager: [phone]

## 2014-03-21 NOTE — Progress Notes (Signed)
Pt completed lexiscan myoview, + hypotension.  250cc NS given, due to pt being dizzy.  BP slowly improved but still around 80 systolic, but dizziness resolved.  Once she sat up to eat and drink her BP up to 85 systolic.  Will give NS at 100cc hr for 2 hours I did discuss with Dr. Allyson Sabal.

## 2014-03-21 NOTE — Progress Notes (Signed)
Subjective: No complaints, no chest pain  Objective: Vital signs in last 24 hours: Temp:  [97.7 F (36.5 C)-99.9 F (37.7 C)] 98 F (36.7 C) (01/29 0500) Pulse Rate:  [81-99] 91 (01/29 0500) Resp:  [14-22] 18 (01/29 0500) BP: (87-121)/(46-72) 93/58 mmHg (01/29 0500) SpO2:  [94 %-97 %] 96 % (01/29 0500) Weight change:  Last BM Date: 03/20/14 Intake/Output from previous day: 01/28 0701 - 01/29 0700 In: 100 [P.O.:100] Out: 100 [Urine:100] Intake/Output this shift:    PE: General:Pleasant affect, NAD Skin:Warm and dry, brisk capillary refill HEENT:normocephalic, sclera clear, mucus membranes moist Heart:S1S2 RRR without murmur, gallup, rub or click Lungs:clear, ant without rales, rhonchi, or wheezes WJX:BJYN, non tender, + BS, do not palpate liver spleen or masses Ext:no lower ext edema,  Toe with dry gangrene Neuro:alert and oriented X 3, MAE, follows commands, + facial symmetry  Tele:  SR  Lab Results:  Recent Labs  03/19/14 0629 03/20/14 0840  WBC 9.1 11.6*  HGB 9.8* 9.7*  HCT 31.4* 30.0*  PLT 178 175   BMET  Recent Labs  03/19/14 0629 03/20/14 0840  NA 131* 132*  K 3.8 4.0  CL 96 95*  CO2 30 28  GLUCOSE 144* 115*  BUN 25* 36*  CREATININE 2.65* 4.08*  CALCIUM 7.9* 7.7*   No results for input(s): TROPONINI in the last 72 hours.  Invalid input(s): CK, MB  Lab Results  Component Value Date   CHOL 152 03/21/2014   HDL 54 03/21/2014   LDLCALC 76 03/21/2014   TRIG 109 03/21/2014   CHOLHDL 2.8 03/21/2014   Lab Results  Component Value Date   HGBA1C 6.9* 12/25/2013     Lab Results  Component Value Date   TSH 1.160 08/19/2013    Hepatic Function Panel  Recent Labs  03/20/14 0840  ALBUMIN 1.8*    Recent Labs  03/21/14 0500  CHOL 152   No results for input(s): PROTIME in the last 72 hours.     Studies/Results: No results found.  Medications: I have reviewed the patient's current medications. Scheduled Meds: .  aspirin EC  81 mg Oral Daily  . darbepoetin (ARANESP) injection - DIALYSIS  200 mcg Intravenous Q Tue-HD  . feeding supplement (NEPRO CARB STEADY)  237 mL Oral TID WC  . ferric gluconate (FERRLECIT/NULECIT) IV  250 mg Intravenous Q T,Th,Sa-HD  . heparin  5,000 Units Subcutaneous 3 times per day  . insulin aspart  0-15 Units Subcutaneous TID WC  . insulin aspart  0-5 Units Subcutaneous QHS  . piperacillin-tazobactam (ZOSYN)  IV  2.25 g Intravenous 3 times per day  . pravastatin  20 mg Oral q1800  . regadenoson      . sevelamer carbonate  800 mg Oral TID WC  . vancomycin  500 mg Intravenous Q T,Th,Sa-HD   Continuous Infusions:  PRN Meds:.acetaminophen **OR** acetaminophen, hydrALAZINE, labetalol, metoprolol, morphine injection, ondansetron **OR** ondansetron (ZOFRAN) IV, oxyCODONE-acetaminophen  Assessment/Plan: Principal Problem:  Cellulitis of foot on zosyn and vanc Active Problems:  HLD (hyperlipidemia) on pravachol, no recent lipid panel  Anemia of chronic disease  End stage renal disease on HD T,Th, Sat  DM (diabetes mellitus) type 2, uncontrolled, with ketoacidosis  Protein-calorie malnutrition, severe  Neuropathy  Ulcer of toe of left foot and sacral ulcer   Encounter for preoperative examination for general surgical procedure, vascular  --check EKG, check Echo with hx of EF 44% on nuc. + risk factors with diabetes and hyperlipidemia  and atherosclerosis of arteries on CT of abd and pelvis.Marland Kitchen   lexiscan myoview today- surgery is planned for Monday   LOS: 4 days   Time spent with pt. :15 minutes. [facility] R  Nurse Practitioner Certified Pager [phone] or after 5pm and on weekends call [phone] 03/21/2014, 10:23 AM   Agree with note written by Nada Boozer RNP  Awaiting results of myoview to preop clear pt for vascular surgery for CLI.   Runell Gess 03/21/2014 2:03 PM

## 2014-03-22 DIAGNOSIS — I959 Hypotension, unspecified: Secondary | ICD-10-CM | POA: Insufficient documentation

## 2014-03-22 DIAGNOSIS — I10 Essential (primary) hypertension: Secondary | ICD-10-CM

## 2014-03-22 HISTORY — PX: TRANSTHORACIC ECHOCARDIOGRAM: SHX275

## 2014-03-22 LAB — GLUCOSE, CAPILLARY
GLUCOSE-CAPILLARY: 116 mg/dL — AB (ref 70–99)
GLUCOSE-CAPILLARY: 152 mg/dL — AB (ref 70–99)
GLUCOSE-CAPILLARY: 165 mg/dL — AB (ref 70–99)

## 2014-03-22 LAB — CBC
HEMATOCRIT: 28.3 % — AB (ref 36.0–46.0)
HEMOGLOBIN: 8.8 g/dL — AB (ref 12.0–15.0)
MCH: 25.3 pg — ABNORMAL LOW (ref 26.0–34.0)
MCHC: 31.1 g/dL (ref 30.0–36.0)
MCV: 81.3 fL (ref 78.0–100.0)
PLATELETS: 183 10*3/uL (ref 150–400)
RBC: 3.48 MIL/uL — ABNORMAL LOW (ref 3.87–5.11)
RDW: 17.6 % — ABNORMAL HIGH (ref 11.5–15.5)
WBC: 9 10*3/uL (ref 4.0–10.5)

## 2014-03-22 LAB — CLOSTRIDIUM DIFFICILE BY PCR: CDIFFPCR: NEGATIVE

## 2014-03-22 MED ORDER — SODIUM CHLORIDE 0.9 % IV SOLN: 100.0000 mL | INTRAVENOUS | Status: DC | PRN

## 2014-03-22 MED ORDER — LIDOCAINE-PRILOCAINE 2.5-2.5 % EX CREA: 1.0000 "application " | TOPICAL_CREAM | CUTANEOUS | Status: DC | PRN

## 2014-03-22 MED ORDER — BOOST / RESOURCE BREEZE PO LIQD
1.0000 | Freq: Three times a day (TID) | ORAL | Status: DC
  Administered 2014-03-23 – 2014-03-28 (×8): 1 via ORAL

## 2014-03-22 MED ORDER — VANCOMYCIN HCL 500 MG IV SOLR
500.0000 mg | Freq: Once | INTRAVENOUS | Status: AC
  Administered 2014-03-23: 500 mg via INTRAVENOUS
  Filled 2014-03-22: qty 500

## 2014-03-22 MED ORDER — HEPARIN SODIUM (PORCINE) 1000 UNIT/ML DIALYSIS: 1000.0000 [IU] | INTRAMUSCULAR | Status: DC | PRN

## 2014-03-22 MED ORDER — PENTAFLUOROPROP-TETRAFLUOROETH EX AERO: 1.0000 "application " | INHALATION_SPRAY | CUTANEOUS | Status: DC | PRN

## 2014-03-22 MED ORDER — NEPRO/CARBSTEADY PO LIQD: 237.0000 mL | ORAL | Status: DC | PRN

## 2014-03-22 MED ORDER — ALTEPLASE 2 MG IJ SOLR: 2.0000 mg | Freq: Once | INTRAMUSCULAR | Status: AC | PRN

## 2014-03-22 MED ORDER — LIDOCAINE HCL (PF) 1 % IJ SOLN: 5.0000 mL | INTRAMUSCULAR | Status: DC | PRN

## 2014-03-22 MED ORDER — SODIUM CHLORIDE 0.9 % IV SOLN
100.0000 mL | INTRAVENOUS | Status: DC | PRN
Start: 2014-03-22 — End: 2014-03-24

## 2014-03-22 MED ORDER — HEPARIN SODIUM (PORCINE) 1000 UNIT/ML DIALYSIS: 3700.0000 [IU] | Freq: Once | INTRAMUSCULAR | Status: DC

## 2014-03-22 MED ORDER — SODIUM CHLORIDE 0.9 % IV SOLN
250.0000 mg | Freq: Once | INTRAVENOUS | Status: AC
  Administered 2014-03-23: 250 mg via INTRAVENOUS
  Filled 2014-03-22: qty 20

## 2014-03-22 NOTE — Progress Notes (Signed)
[facility] PROGRESS NOTE   Libi Gundy ZOX:[id number] DOB: [birth year] DOA: 03/17/2014 PCP: [name], MD  HPI: The patient presented with complaints of infection ofLeft foot toe. She was found to have gangrenous changes. She was subsequently admitted to the hospital. Vascular surgery was consulted. She underwent arteriogram. Plan is for surgical intervention if cleared by cardiology. She underwent stress test on 1/29.  Subjective: Patient feels well. Denies any chest pain, no shortness of breath. Denies any pain in her left foot. No further dizziness or lightheadedness.   Assessment/Plan: Principal Problem:   Cellulitis of foot Active Problems:   HLD (hyperlipidemia)   Anemia of chronic disease   End stage renal disease   Protein-calorie malnutrition, severe   Neuropathy   Ulcer of toe of left foot   Encounter for preoperative examination for general surgical procedure, vascular   Preop cardiovascular exam   DM type 2 (diabetes mellitus, type 2)   Black toe   Cellulitis of Left foot/Ulcer of Toe of Left Foot -Due to peripheral vascular disease and diabetes. -MRI w/o contrast of left foot showed "Devitalization of the middle phalanx of the second toe. Abnormal signal from the proximal phalanx of the second toe is most likely due being devitalized. There is no discrete destruction of the proximal phalanx. Adjacent cellulitis. The distal phalanx of the second toe is missing." -Bilateral lower extremity venous duplex showed no evidence of DVT, superficial thrombosis, or Baker's cyst. -Continue vancomycin and zosyn. Blood cultures negative so far. -Patient underwent arteriogram 1/27. Vascular surgery is planning popliteal tibial endarterectomy and toe amputation on Monday, 2/1. Cardiology consulted for preoperative cardiac evaluation. Stress test was intermediate risk for ischemia. Echocardiogram is pending. Cardiology to give final recommendations once echocardiogram report  is available.   Diarrhea Hasn't had any further episodes since yesterday. C. difficile is negative. May discontinue enteric isolation. Can give Imodium if she has recurrence.  DM Type 2, uncontrolled Continue NovoLOG on sliding scale. Monitor CBGs.  End stage renal disease TTS/Hyponatremia -Renal failure likely due to diabetes -Management per Nephrology. Nephrologist was notified regarding her low blood pressure yesterday. Blood pressures improved today. She will be dialyzed today. She is apparently at her dry weight.  Hyperlipidemia -Continue pravastatin.   History of essential Hypertension Blood pressure improved overnight. Continue to monitor. She remains asymptomatic. Hemoglobin stable.  Anemia of Chronic disease -Hgb remains stable.  DVT Prophylaxis: Heparin Code Status: Full Code Family Communication: Plan discussed with the patient. Disposition Plan: Not ready for discharge  Consultants:  Vascular surgery  Nephrology  Cardiology  Procedures: Arteriogram 03/19/2014 Operative findings: #1 occlusion anterior tibial and posterior tibial arteries bilaterally   #2 90% stenosis origin left peroneal artery   #3 one vessel runoff right peroneal with in-line flow  Nuclear stress test 1/29 IMPRESSION: 1. Inferior wall scar. No ischemia. 2. Mild global hypokinesis and inferior wall motion abnormality. 3. Left ventricular ejection fraction 35% 4. Intermediate-risk stress test findings*.  2D ECHO Pending  Antibiotics:  Vancomycin 1/25>>  Zosyn 1/25>>   Objective: Filed Vitals:   03/22/14 0505  BP: 100/61  Pulse: 97  Temp: 98.3 F (36.8 C)  Resp: 16    Intake/Output Summary (Last 24 hours) at 03/22/14 1055 Last data filed at 03/22/14 0800  Gross per 24 hour  Intake    220 ml  Output      0 ml  Net    220 ml   Filed Weights   03/19/14 0504 03/20/14 0500 03/22/14 0505  Weight: 35.9  kg (79  lb 2.3 oz) 36.288 kg (80 lb) 42.275 kg (93 lb 3.2 oz)    Exam: General: Alert and awake, oriented x3, not in any acute distress, CVS: S1-S2 clear, possible murmur. Chest: clear to auscultation bilaterally, no wheezing, rales or rhonchi;  Abdomen: soft, nontender, nondistended, quiet bowel sounds, scar in hypogastric region (pt says from suprapubic catheter) Extremities: Left foot second toe is black and necrotic; left foot is tender to light palpation. Pedal pulses difficult to palpate. No focal deficits  Data Reviewed: Basic Metabolic Panel:  Recent Labs Lab 03/17/14 1726 03/18/14 0600 03/19/14 0629 03/20/14 0840  NA 141 140 131* 132*  K 4.5 4.3 3.8 4.0  CL 104 102 96 95*  CO2 26 27 30 28   GLUCOSE 126* 173* 144* 115*  BUN 42* 48* 25* 36*  CREATININE 3.63* 4.15* 2.65* 4.08*  CALCIUM 9.0 8.6 7.9* 7.7*  PHOS  --   --   --  5.2*   Liver Function Tests:  Recent Labs Lab 03/18/14 0600 03/20/14 0840  AST 21  --   ALT 35  --   ALKPHOS 113  --   BILITOT 0.5  --   PROT 6.1  --   ALBUMIN 2.1* 1.8*   CBC:  Recent Labs Lab 03/17/14 1726 03/18/14 0600 03/19/14 0629 03/20/14 0840 03/21/14 1520 03/22/14 0350  WBC 11.6* 8.6 9.1 11.6* 10.4 9.0  NEUTROABS 10.4*  --   --   --   --   --   HGB 10.4* 8.8* 9.8* 9.7* 9.4* 8.8*  HCT 32.9* 28.0* 31.4* 30.0* 30.2* 28.3*  MCV 82.3 83.6 81.3 82.0 81.6 81.3  PLT 177 171 178 175 176 183   CBG:  Recent Labs Lab 03/20/14 2140 03/21/14 0556 03/21/14 1346 03/21/14 1618 03/21/14 2043  GLUCAP 225* 159* 166* 247* 228*    Micro Recent Results (from the past 240 hour(s))  Culture, blood (single)     Status: None (Preliminary result)   Collection Time: 03/17/14  5:31 PM  Result Value Ref Range Status   Specimen Description BLOOD ARM RIGHT  Final   Special Requests BOTTLES DRAWN AEROBIC AND ANAEROBIC 5CC  Final   Culture   Final           BLOOD CULTURE RECEIVED NO GROWTH TO DATE CULTURE WILL BE HELD FOR 5 DAYS BEFORE ISSUING A  FINAL NEGATIVE REPORT Performed at [facility]    Report Status PENDING  Incomplete  MRSA PCR Screening     Status: None   Collection Time: 03/21/14  7:23 AM  Result Value Ref Range Status   MRSA by PCR NEGATIVE NEGATIVE Final    Comment:        The GeneXpert MRSA Assay (FDA approved for NASAL specimens only), is one component of a comprehensive MRSA colonization surveillance program. It is not intended to diagnose MRSA infection nor to guide or monitor treatment for MRSA infections.      Studies: Nm Myocar Multi W/spect W/wall Motion / Ef  03/21/2014   CLINICAL DATA:  Chest pain.  Shortness of Breath.  Diabetes.  EXAM: MYOCARDIAL IMAGING WITH SPECT (REST AND PHARMACOLOGIC-STRESS)  GATED LEFT VENTRICULAR WALL MOTION STUDY  LEFT VENTRICULAR EJECTION FRACTION  TECHNIQUE: Standard myocardial SPECT imaging was performed after resting intravenous injection of 10 mCi Tc-46m sestamibi. Subsequently, intravenous infusion of Lexiscan was performed under the supervision of the Cardiology staff. At peak effect of the drug, 30 mCi Tc-8m sestamibi was injected intravenously and standard myocardial SPECT imaging  was performed. Quantitative gated imaging was also performed to evaluate left ventricular wall motion, and estimate left ventricular ejection fraction.  COMPARISON:  None.  FINDINGS: Perfusion: Decreased uptake in the inferior wall on both sets of imaging suggesting inferior wall scar. Associated wall motion abnormality. No reversible defects to suggest ischemia.  Wall Motion: Mild global hypokinesis and inferior wall motion abnormality.  Left Ventricular Ejection Fraction: 35 %  End diastolic volume 116 ml  End systolic volume 75 ml  IMPRESSION: 1. Inferior wall scar.  No ischemia.  2. Mild global hypokinesis and inferior wall motion abnormality.  3. Left ventricular ejection fraction 35%  4. Intermediate-risk stress test findings*.  *2012 Appropriate Use Criteria for Coronary  Revascularization Focused Update: J Am Coll Cardiol. 2012;59(9):857-881. http://content.dementiazones.com.aspx?articleid=1201161   Electronically Signed   By: Loralie Champagne M.D.   On: 03/21/2014 15:34    Scheduled Meds: . aspirin EC  81 mg Oral Daily  . darbepoetin (ARANESP) injection - DIALYSIS  200 mcg Intravenous Q Tue-HD  . feeding supplement (NEPRO CARB STEADY)  237 mL Oral TID WC  . ferric gluconate (FERRLECIT/NULECIT) IV  250 mg Intravenous Q T,Th,Sa-HD  . [START ON 03/23/2014] heparin  3,700 Units Dialysis Once in dialysis  . heparin  5,000 Units Subcutaneous 3 times per day  . insulin aspart  0-15 Units Subcutaneous TID WC  . insulin aspart  0-5 Units Subcutaneous QHS  . piperacillin-tazobactam (ZOSYN)  IV  2.25 g Intravenous 3 times per day  . pravastatin  20 mg Oral q1800  . sevelamer carbonate  800 mg Oral TID WC  . vancomycin  500 mg Intravenous Q T,Th,Sa-HD   Continuous Infusions:     Time spent: 20 minutes   [facility] Pager [phone]    If 7PM-7AM, please contact night-coverage at [email or web address], password [facility] 03/22/2014, 10:55 AM  LOS: 5 days

## 2014-03-22 NOTE — Progress Notes (Signed)
Subjective:  No co, for hd today  Objective Vital signs in last 24 hours: Filed Vitals:   03/21/14 1304 03/21/14 2045 03/22/14 0505 03/22/14 1334  BP: 98/57 99/55 100/61 106/61  Pulse: 84 104 97 107  Temp: 98.1 F (36.7 C) 98.6 F (37 C) 98.3 F (36.8 C)   TempSrc: Oral Oral Oral   Resp: Height:      Weight:   42.275 kg (93 lb 3.2 oz)   SpO2: 97% 95% 94% 98%   Weight change:  Physical Exam: General: alert, thin Cachetic elderly BF Nad, OX3 Heart: RRR , Gr 2/6 Hol[place]ys M, no rub, or gallop  Lungs: clear BS bilat., no labored breathibng  Abdomen: Soft, NT, ND Extremities: No pedal edema with Trophic changes in feet, with erythematous skin changes to Mid foot./ R 2 toe dry gangr. Dialysis Access: LUA AVF no bruit or thrill / R IJ Perm cath   OP HD: TTS [facility] 3.5h 35.5kg 2/2.25 [place] Heparin 3700 R IJ perm cath (clotted LUA AVF plans for [facility] vein mapping 03/28/14) No meds Labs hgb 11.5 , ca 10.0 , pos 3.9/ pth 84  Problem/Plan: 1. 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 Darrick Penna eval with Arteriogram= occlusion anterior tibial and posterior tibial arteries bilaterally ,90% stenosis origin left peroneal artery, and one vessel runoff right peroneal with in-line flow done and Plans Tentatively for Monday Popliteal endarterectomy and toe amp. /  Pending Card  Clearance/ on iv vanco/ zosyn 2. ESRD - TTS HD ( [facility] op unit) hd On schedule  3. Hypertension/volume - BP's low , holding coreg, at dry wt, got fluid today for hypotension and tolerated well 4. Anemia - hgb 8.8 / started 200 mcg Araensp/ 17% op tfs started Fe load 5. Metabolic bone disease - renvela binder no vit d ( pth 84) Ca corrected= 9.4/ phos 5.2  6. Nutrition - Malnourished use protein supplement on nepro tid Alb 1.8/ wants to try breeze instead of nepro 7. FTT- Lives by herself ?? Family assistance vs NHP at [place]   Lenny Pastel, PA-C [facility] Beeper [phone] 03/22/2014,2:44 PM  LOS: 5 days   Pt seen, examined, agree w assess/plan as above with additions as indicated.  Vinson Moselle MD pager [phone]    cell [phone] 03/22/2014, 3:41 PM     Labs: Basic Metabolic Panel:  Recent Labs Lab 03/18/14 0600 03/19/14 0629 03/20/14 0840  NA 140 131* 132*  K 4.3 3.8 4.0  CL 102 96 95*  CO2 GLUCOSE 173* 144* 115*  BUN 48* 25* 36*  CREATININE 4.15* 2.65* 4.08*  CALCIUM 8.6 7.9* 7.7*  PHOS  --   --  5.2*   Liver Function Tests:  Recent Labs Lab 03/18/14 0600 03/20/14 0840  AST 21  --   ALT 35  --   ALKPHOS 113  --   BILITOT 0.5  --   PROT 6.1  --   ALBUMIN 2.1* 1.8*  CBC:  Recent Labs Lab 03/17/14 1726 03/18/14 0600 03/19/14 0629 03/20/14 0840 03/21/14 1520 03/22/14 0350  WBC 11.6* 8.6 9.1 11.6* 10.4 9.0  NEUTROABS 10.4*  --   --   --   --   --   HGB 10.4* 8.8* 9.8* 9.7* 9.4* 8.8*  HCT 32.9* 28.0* 31.4* 30.0* 30.2* 28.3*  MCV 82.3 83.6 81.3 82.0 81.6 81.3  PLT 177 171 178 175 176 183  CBG:  Recent Labs Lab 03/21/14  0814 03/21/14 1346 03/21/14 1618 03/21/14 2043 03/22/14 1124  GLUCAP 159* 166* 247* 228* 116*    Studies/Results: Nm Myocar Multi W/spect W/wall Motion / Ef  03/21/2014   CLINICAL DATA:  Chest pain.  Shortness of Breath.  Diabetes.  EXAM: MYOCARDIAL IMAGING WITH SPECT (REST AND PHARMACOLOGIC-STRESS)  GATED LEFT VENTRICULAR WALL MOTION STUDY  LEFT VENTRICULAR EJECTION FRACTION  TECHNIQUE: Standard myocardial SPECT imaging was performed after resting intravenous injection of 10 mCi Tc-51m sestamibi. Subsequently, intravenous infusion of Lexiscan was performed under the supervision of the Cardiology staff. At peak effect of the drug, 30 mCi Tc-42m sestamibi was injected intravenously and standard myocardial SPECT imaging was performed. Quantitative gated imaging was also performed to evaluate left ventricular wall motion, and estimate left ventricular ejection  fraction.  COMPARISON:  None.  FINDINGS: Perfusion: Decreased uptake in the inferior wall on both sets of imaging suggesting inferior wall scar. Associated wall motion abnormality. No reversible defects to suggest ischemia.  Wall Motion: Mild global hypokinesis and inferior wall motion abnormality.  Left Ventricular Ejection Fraction: 35 %  End diastolic volume 116 ml  End systolic volume 75 ml  IMPRESSION: 1. Inferior wall scar.  No ischemia.  2. Mild global hypokinesis and inferior wall motion abnormality.  3. Left ventricular ejection fraction 35%  4. Intermediate-risk stress test findings*.  *2012 Appropriate Use Criteria for Coronary Revascularization Focused Update: J Am Coll Cardiol. 2012;59(9):857-881. http://content.dementiazones.com.aspx?articleid=1201161   Electronically Signed   By: Loralie Champagne M.D.   On: 03/21/2014 15:34   Medications:   . aspirin EC  81 mg Oral Daily  . darbepoetin (ARANESP) injection - DIALYSIS  200 mcg Intravenous Q Tue-HD  . feeding supplement (NEPRO CARB STEADY)  237 mL Oral TID WC  . ferric gluconate (FERRLECIT/NULECIT) IV  250 mg Intravenous Q T,Th,Sa-HD  . [START ON 03/23/2014] heparin  3,700 Units Dialysis Once in dialysis  . heparin  5,000 Units Subcutaneous 3 times per day  . insulin aspart  0-15 Units Subcutaneous TID WC  . insulin aspart  0-5 Units Subcutaneous QHS  . piperacillin-tazobactam (ZOSYN)  IV  2.25 g Intravenous 3 times per day  . pravastatin  20 mg Oral q1800  . sevelamer carbonate  800 mg Oral TID WC  . vancomycin  500 mg Intravenous Q T,Th,Sa-HD

## 2014-03-22 NOTE — Progress Notes (Signed)
Patient ID: Elizabeth [name], female   DOB: Sep 16, 1945, 68 y.o.   MRN: [id number]    Subjective:    No complaints this AM  Objective:   Temp:  [98.1 F (36.7 C)-98.6 F (37 C)] 98.3 F (36.8 C) (01/30 0505) Pulse Rate:  [84-104] 97 (01/30 0505) Resp:  [16-18] 16 (01/30 0505) BP: (97-100)/(55-61) 100/61 mmHg (01/30 0505) SpO2:  [94 %-97 %] 94 % (01/30 0505) Weight:  [93 lb 3.2 oz (42.275 kg)] 93 lb 3.2 oz (42.275 kg) (01/30 0505) Last BM Date: 03/21/14  Filed Weights   03/19/14 0504 03/20/14 0500 03/22/14 0505  Weight: 79 lb 2.3 oz (35.9 kg) 80 lb (36.288 kg) 93 lb 3.2 oz (42.275 kg)    Intake/Output Summary (Last 24 hours) at 03/22/14 1152 Last data filed at 03/22/14 0800  Gross per 24 hour  Intake    220 ml  Output      0 ml  Net    220 ml    Exam:  General: NAD  Resp: CTAB  Cardiac: RRR, no m/r/g, no JVD  GI: abdomen soft, NT, ND  MSK: no LE edema  Neuro: no focal deficits   Lab Results:  Basic Metabolic Panel:  Recent Labs Lab 03/18/14 0600 03/19/14 0629 03/20/14 0840  NA 140 131* 132*  K 4.3 3.8 4.0  CL 102 96 95*  CO2 27 30 28   GLUCOSE 173* 144* 115*  BUN 48* 25* 36*  CREATININE 4.15* 2.65* 4.08*  CALCIUM 8.6 7.9* 7.7*    Liver Function Tests:  Recent Labs Lab 03/18/14 0600 03/20/14 0840  AST 21  --   ALT 35  --   ALKPHOS 113  --   BILITOT 0.5  --   PROT 6.1  --   ALBUMIN 2.1* 1.8*    CBC:  Recent Labs Lab 03/20/14 0840 03/21/14 1520 03/22/14 0350  WBC 11.6* 10.4 9.0  HGB 9.7* 9.4* 8.8*  HCT 30.0* 30.2* 28.3*  MCV 82.0 81.6 81.3  PLT 175 176 183    Cardiac Enzymes: No results for input(s): CKTOTAL, CKMB, CKMBINDEX, TROPONINI in the last 168 hours.  BNP: No results for input(s): PROBNP in the last 8760 hours.  Coagulation:  Recent Labs Lab 03/18/14 0600  INR 1.07    ECG:   Medications:   Scheduled Medications: . aspirin EC  81 mg Oral Daily  . darbepoetin (ARANESP) injection - DIALYSIS  200 mcg  Intravenous Q Tue-HD  . feeding supplement (NEPRO CARB STEADY)  237 mL Oral TID WC  . ferric gluconate (FERRLECIT/NULECIT) IV  250 mg Intravenous Q T,Th,Sa-HD  . [START ON 03/23/2014] heparin  3,700 Units Dialysis Once in dialysis  . heparin  5,000 Units Subcutaneous 3 times per day  . insulin aspart  0-15 Units Subcutaneous TID WC  . insulin aspart  0-5 Units Subcutaneous QHS  . piperacillin-tazobactam (ZOSYN)  IV  2.25 g Intravenous 3 times per day  . pravastatin  20 mg Oral q1800  . sevelamer carbonate  800 mg Oral TID WC  . vancomycin  500 mg Intravenous Q T,Th,Sa-HD     Infusions:     PRN Medications:  sodium chloride, sodium chloride, acetaminophen **OR** acetaminophen, alteplase, feeding supplement (NEPRO CARB STEADY), heparin, lidocaine (PF), lidocaine-prilocaine, morphine injection, ondansetron **OR** ondansetron (ZOFRAN) IV, oxyCODONE-acetaminophen, pentafluoroprop-tetrafluoroeth     Assessment/Plan   1. Left toe gangrene - per primary team and vascular, plans for surgery.   2. ESRD - HD per renal  3. Preoperative evaluation - no  known cardiac disease but hx of CVA, PAD, DM. - awaiting echo results (reordered today, does not appear it was done 03/20/14).  - Lexiscan LVEF 35%, inferior wall scar with no ischemia - awaiting echo results to correlate with low LVEF by nuclear scan before making final recs.        Dina Rich, M.D.

## 2014-03-22 NOTE — Progress Notes (Signed)
  Echocardiogram 2D Echocardiogram has been performed.  Elizabeth [name] 03/22/2014, 10:05 AM

## 2014-03-23 DIAGNOSIS — E43 Unspecified severe protein-calorie malnutrition: Secondary | ICD-10-CM

## 2014-03-23 LAB — CBC
HCT: 30.1 % — ABNORMAL LOW (ref 36.0–46.0)
Hemoglobin: 9.3 g/dL — ABNORMAL LOW (ref 12.0–15.0)
MCH: 25.5 pg — AB (ref 26.0–34.0)
MCHC: 30.9 g/dL (ref 30.0–36.0)
MCV: 82.7 fL (ref 78.0–100.0)
Platelets: 203 10*3/uL (ref 150–400)
RBC: 3.64 MIL/uL — AB (ref 3.87–5.11)
RDW: 17.3 % — ABNORMAL HIGH (ref 11.5–15.5)
WBC: 8.6 10*3/uL (ref 4.0–10.5)

## 2014-03-23 LAB — BASIC METABOLIC PANEL
ANION GAP: 10 (ref 5–15)
BUN: 34 mg/dL — ABNORMAL HIGH (ref 6–23)
CHLORIDE: 94 mmol/L — AB (ref 96–112)
CO2: 27 mmol/L (ref 19–32)
Calcium: 8 mg/dL — ABNORMAL LOW (ref 8.4–10.5)
Creatinine, Ser: 4.68 mg/dL — ABNORMAL HIGH (ref 0.50–1.10)
GFR, EST AFRICAN AMERICAN: 10 mL/min — AB (ref >90)
GFR, EST NON AFRICAN AMERICAN: 9 mL/min — AB (ref >90)
GLUCOSE: 157 mg/dL — AB (ref 70–99)
Potassium: 4.2 mmol/L (ref 3.5–5.1)
SODIUM: 131 mmol/L — AB (ref 135–145)

## 2014-03-23 LAB — GLUCOSE, CAPILLARY
Glucose-Capillary: 152 mg/dL — ABNORMAL HIGH (ref 70–99)
Glucose-Capillary: 69 mg/dL — ABNORMAL LOW (ref 70–99)
Glucose-Capillary: 259 mg/dL — ABNORMAL HIGH (ref 70–99)

## 2014-03-23 MED ORDER — SODIUM CHLORIDE 0.9 % IJ SOLN
3.0000 mL | Freq: Two times a day (BID) | INTRAMUSCULAR | Status: DC
  Administered 2014-03-23 – 2014-03-24 (×2): 3 mL via INTRAVENOUS

## 2014-03-23 MED ORDER — SODIUM CHLORIDE 0.9 % IV SOLN: 250.0000 mL | INTRAVENOUS | Status: DC | PRN

## 2014-03-23 MED ORDER — ASPIRIN 81 MG PO CHEW
81.0000 mg | CHEWABLE_TABLET | ORAL | Status: AC
  Administered 2014-03-24: 81 mg via ORAL
  Filled 2014-03-23: qty 1

## 2014-03-23 MED ORDER — METOPROLOL SUCCINATE 12.5 MG HALF TABLET
12.5000 mg | ORAL_TABLET | Freq: Every day | ORAL | Status: DC
  Administered 2014-03-23 – 2014-03-26 (×4): 12.5 mg via ORAL
  Filled 2014-03-23 (×4): qty 1

## 2014-03-23 MED ORDER — SODIUM CHLORIDE 0.9 % IJ SOLN: 3.0000 mL | INTRAMUSCULAR | Status: DC | PRN

## 2014-03-23 NOTE — Progress Notes (Addendum)
Subjective:  Missed HD yest d/t staffing problems  Objective Vital signs in last 24 hours: Filed Vitals:   03/22/14 1334 03/22/14 2049 03/23/14 0347 03/23/14 0500  BP: 106/61 101/62 108/69   Pulse: 107 108 102   Temp: 98.1 F (36.7 C) 98.3 F (36.8 C) 98.5 F (36.9 C)   TempSrc: Oral Oral Oral   Resp: 16 18 18    Height:      Weight:    43 kg (94 lb 12.8 oz)  SpO2: 98% 93% 92%    Weight change: 0.725 kg (1 lb 9.6 oz) Physical Exam: General: alert, thin Cachetic elderly BF Nad, OX3 Heart: RRR , Gr 2/6 [place]ys M, no rub, or gallop  Lungs: clear BS bilat., no labored breathibng  Abdomen: Soft, NT, ND Extremities: No pedal edema with Trophic changes in feet, with erythematous skin changes to Mid foot./ R 2 toe dry gangr. Dialysis Access: LUA AVF no bruit or thrill / R IJ Perm cath   OP HD: TTS [facility] 3.5h 35.5kg 2/2.25 [place] Heparin 3700 R IJ perm cath (clotted LUA AVF plans for VVS vein mapping 03/28/14) No meds Labs hgb 11.5 , ca 10.0 , pos 3.9/ pth 84  Problem/Plan:                               1. L foot ischemia / early gangrene - for surgery by [facility] after card eval completed 2. Systolic heart failure - inf scar, no old echos, EF 30-35%. Cardiology planning heart cath Monday.  3. ESRD - TTS HD 4. Hypertension/volume - BP's low , holding coreg, at dry wt, got fluid today for hypotension and tolerated well 5. Anemia - hgb 8.8 / started 200 mcg Araensp/ 17% op tfs started Fe load 6. Metabolic bone disease - renvela binder no vit d ( pth 84) Ca corrected= 9.4/ phos 5.2  7. Nutrition - Malnourished use protein supplement on nepro tid Alb 1.8/ wants to try breeze instead of nepro 8. FTT   Lenny Pastel, PA-C [facility] Beeper [phone] 03/23/2014,11:42 AM  LOS: 6 days   Pt seen, examined, agree w assess/plan as above with additions as indicated. Has gained wt d/t IV meds and KVO's.  UF 2.5kg w HD today. For heart cath tomorrow  given abnormal echo.   Vinson Moselle MD pager [phone]    cell [phone] 03/23/2014, 11:42 AM     Labs: Basic Metabolic Panel:  Recent Labs Lab 03/19/14 0629 03/20/14 0840 03/23/14 0315  NA 131* 132* 131*  K 3.8 4.0 4.2  CL 96 95* 94*  CO2 30 28 27   GLUCOSE 144* 115* 157*  BUN 25* 36* 34*  CREATININE 2.65* 4.08* 4.68*  CALCIUM 7.9* 7.7* 8.0*  PHOS  --  5.2*  --    Liver Function Tests:  Recent Labs Lab 03/18/14 0600 03/20/14 0840  AST 21  --   ALT 35  --   ALKPHOS 113  --   BILITOT 0.5  --   PROT 6.1  --   ALBUMIN 2.1* 1.8*  CBC:  Recent Labs Lab 03/17/14 1726  03/19/14 0629 03/20/14 0840 03/21/14 1520 03/22/14 0350 03/23/14 0315  WBC 11.6*  < > 9.1 11.6* 10.4 9.0 8.6  NEUTROABS 10.4*  --   --   --   --   --   --   HGB 10.4*  < > 9.8* 9.7* 9.4* 8.8* 9.3*  HCT 32.9*  < >  31.4* 30.0* 30.2* 28.3* 30.1*  MCV 82.3  < > 81.3 82.0 81.6 81.3 82.7  PLT 177  < > 178 175 176 183 203  < > = values in this interval not displayed.CBG:  Recent Labs Lab 03/21/14 2043 03/22/14 1124 03/22/14 1620 03/22/14 2045 03/23/14 0611  GLUCAP 228* 116* 152* 165* 152*    Studies/Results: No results found. Medications:   . aspirin EC  81 mg Oral Daily  . darbepoetin (ARANESP) injection - DIALYSIS  200 mcg Intravenous Q Tue-HD  . feeding supplement (RESOURCE BREEZE)  1 Container Oral TID WC  . ferric gluconate (FERRLECIT/NULECIT) IV  250 mg Intravenous Q T,Th,Sa-HD  . ferric gluconate (FERRLECIT/NULECIT) IV  250 mg Intravenous Once  . heparin  3,700 Units Dialysis Once in dialysis  . heparin  5,000 Units Subcutaneous 3 times per day  . insulin aspart  0-15 Units Subcutaneous TID WC  . insulin aspart  0-5 Units Subcutaneous QHS  . metoprolol succinate  12.5 mg Oral Daily  . piperacillin-tazobactam (ZOSYN)  IV  2.25 g Intravenous 3 times per day  . pravastatin  20 mg Oral q1800  . sevelamer carbonate  800 mg Oral TID WC  . vancomycin  500 mg Intravenous Q T,Th,Sa-HD   . vancomycin  500 mg Intravenous Once

## 2014-03-23 NOTE — Procedures (Signed)
I was present at this dialysis session, have reviewed the session itself and made  appropriate changes  Vinson Moselle MD (pgr) [phone]    ([phone] 03/23/2014, 12:40 PM

## 2014-03-23 NOTE — Progress Notes (Signed)
Patient ID: Elizabeth [name], female   DOB: Nov 04, 1945, 69 y.o.   MRN: [id number]    Subjective:   No complaints this AM   Objective:   Temp:  [98.1 F (36.7 C)-98.5 F (36.9 C)] 98.5 F (36.9 C) (01/31 0347) Pulse Rate:  [102-108] 102 (01/31 0347) Resp:  [16-18] 18 (01/31 0347) BP: (101-108)/(61-69) 108/69 mmHg (01/31 0347) SpO2:  [92 %-98 %] 92 % (01/31 0347) Weight:  [94 lb 12.8 oz (43 kg)] 94 lb 12.8 oz (43 kg) (01/31 0500) Last BM Date: 03/21/14  Filed Weights   03/20/14 0500 03/22/14 0505 03/23/14 0500  Weight: 80 lb (36.288 kg) 93 lb 3.2 oz (42.275 kg) 94 lb 12.8 oz (43 kg)    Intake/Output Summary (Last 24 hours) at 03/23/14 0854 Last data filed at 03/23/14 0348  Gross per 24 hour  Intake    240 ml  Output    100 ml  Net    140 ml     Exam:  General: NAD  Resp: CTAB  Cardiac: RRR, no m/r/g, no JVD  GI: abdomen soft, NT, ND  MSK:no LE edema  Neuro: no focal deficits   Lab Results:  Basic Metabolic Panel:  Recent Labs Lab 03/19/14 0629 03/20/14 0840 03/23/14 0315  NA 131* 132* 131*  K 3.8 4.0 4.2  CL 96 95* 94*  CO2 GLUCOSE 144* 115* 157*  BUN 25* 36* 34*  CREATININE 2.65* 4.08* 4.68*  CALCIUM 7.9* 7.7* 8.0*    Liver Function Tests:  Recent Labs Lab 03/18/14 0600 03/20/14 0840  AST 21  --   ALT 35  --   ALKPHOS 113  --   BILITOT 0.5  --   PROT 6.1  --   ALBUMIN 2.1* 1.8*    CBC:  Recent Labs Lab 03/21/14 1520 03/22/14 0350 03/23/14 0315  WBC 10.4 9.0 8.6  HGB 9.4* 8.8* 9.3*  HCT 30.2* 28.3* 30.1*  MCV 81.6 81.3 82.7  PLT 176 183 203    Cardiac Enzymes: No results for input(s): CKTOTAL, CKMB, CKMBINDEX, TROPONINI in the last 168 hours.  BNP: No results for input(s): PROBNP in the last 8760 hours.  Coagulation:  Recent Labs Lab 03/18/14 0600  INR 1.07    ECG:   Medications:   Scheduled Medications: . aspirin EC  81 mg Oral Daily  . darbepoetin (ARANESP) injection - DIALYSIS  200 mcg  Intravenous Q Tue-HD  . feeding supplement (RESOURCE BREEZE)  1 Container Oral TID WC  . ferric gluconate (FERRLECIT/NULECIT) IV  250 mg Intravenous Q T,Th,Sa-HD  . ferric gluconate (FERRLECIT/NULECIT) IV  250 mg Intravenous Once  . heparin  3,700 Units Dialysis Once in dialysis  . heparin  5,000 Units Subcutaneous 3 times per day  . insulin aspart  0-15 Units Subcutaneous TID WC  . insulin aspart  0-5 Units Subcutaneous QHS  . piperacillin-tazobactam (ZOSYN)  IV  2.25 g Intravenous 3 times per day  . pravastatin  20 mg Oral q1800  . sevelamer carbonate  800 mg Oral TID WC  . vancomycin  500 mg Intravenous Q T,Th,Sa-HD  . vancomycin  500 mg Intravenous Once     Infusions:     PRN Medications:  sodium chloride, sodium chloride, acetaminophen **OR** acetaminophen, feeding supplement (NEPRO CARB STEADY), heparin, lidocaine (PF), lidocaine-prilocaine, morphine injection, ondansetron **OR** ondansetron (ZOFRAN) IV, oxyCODONE-acetaminophen, pentafluoroprop-tetrafluoroeth     Assessment/Plan    1. Left toe gangrene - per primary team and vascular, plans for surgery.  2. ESRD - HD per renal  3, Systolic heart failure - echo this admit LVEF 30-35%, severe inferior hypokinesis. No old echoes to compare, nuclear study from 03/2012 showed LVEF 44% and evidence of inferior scar with mild peri-infarct ischemia. Coronaries were not studied at that time due to CKD, she has since started HD.  - overall findings consistent with likely ischemic cardiomyopathy. She has never had her coronaries previously studied, MPI shows inferior scar without active ischemia - given degree of systolic dysfunction she needs a LHC/RHC to better understand her cardiomyopathy and overall surgical risk based on coronary anatomy. We will arrange LHC/RHC for tomorrow.  - fairly labile hemodynamics, will try starting very low dose Toprol XL. Will need to defer surgery until LHC/RHC and allow a few days to follow  hemodynamics on beta blocker.   4. Preoperative evaluation - abnormal cardiac findings as described above, plan for RHC/LHC tomorrow.         Dina Rich, M.D., F.A.C.C.

## 2014-03-23 NOTE — Progress Notes (Addendum)
[facility] PROGRESS NOTE   Elizabeth [name] GEX:[id number] DOB: June 19, 1945 DOA: 03/17/2014 PCP: [name], MD  HPI: The patient presented with complaints of infection ofLeft foot toe. She was found to have gangrenous changes. She was subsequently admitted to the hospital. Vascular surgery was consulted. She underwent arteriogram. Plan is for surgical intervention if cleared by cardiology. She underwent stress test on 1/29.  Subjective: Patient denies any complaints. Had multiple questions regarding the results of her stress test and echocardiogram and further plan, which were answered.    Assessment/Plan: Principal Problem:   Cellulitis of foot Active Problems:   HLD (hyperlipidemia)   Anemia of chronic disease   End stage renal disease   Protein-calorie malnutrition, severe   Neuropathy   Ulcer of toe of left foot   Encounter for preoperative examination for general surgical procedure, vascular   Preop cardiovascular exam   DM type 2 (diabetes mellitus, type 2)   Black toe   Arterial hypotension   Cellulitis of Left foot/Ulcer of Toe of Left Foot -Due to peripheral vascular disease and diabetes. -MRI w/o contrast of left foot showed "Devitalization of the middle phalanx of the second toe. Abnormal signal from the proximal phalanx of the second toe is most likely due being devitalized. There is no discrete destruction of the proximal phalanx. Adjacent cellulitis. The distal phalanx of the second toe is missing." -Bilateral lower extremity venous duplex showed no evidence of DVT, superficial thrombosis, or Baker's cyst. -Continue vancomycin and zosyn until we determine the long-term plan. Blood cultures negative so far. -Patient underwent arteriogram 1/27. Vascular surgery is planning popliteal tibial endarterectomy and toe amputation. But await clearance from cardiology. Cardiology consulted for preoperative cardiac evaluation. Stress test was intermediate risk for ischemia.  Echocardiogram reveals diminished ejection fraction, inferior hypokinesis. Plan is for a cardiac catheterization tomorrow.   Chronic systolic dysfunction  Seen on stress test and echocardiogram. Plan is for catheterization tomorrow. Cardiology is following. She is well compensated clinically.  Diarrhea Appears to have resolved. C. difficile is negative. Can give Imodium if she has recurrence.  DM Type 2, uncontrolled Continue NovoLOG on sliding scale. Monitor CBGs.  End stage renal disease TTS/Hyponatremia -Renal failure likely due to diabetes -Management per Nephrology. She was apparently not dialyzed on Saturday due to staffing issues.   Hyperlipidemia -Continue pravastatin.   History of essential Hypertension with episodes of hypotension Blood pressure remains stable. Continue to monitor for now. Hold all blood pressure lowering agents.   Anemia of Chronic disease -Hgb remains stable.  Severe protein calorie malnutrition Continue with protein supplements.  DVT Prophylaxis: Heparin Code Status: Full Code Family Communication: Plan discussed with the patient and her granddaughter. Disposition Plan: Not ready for discharge  Consultants:  Vascular surgery  Nephrology  Cardiology  Procedures: Arteriogram 03/19/2014 Operative findings: #1 occlusion anterior tibial and posterior tibial arteries bilaterally   #2 90% stenosis origin left peroneal artery   #3 one vessel runoff right peroneal with in-line flow  Nuclear stress test 1/29 IMPRESSION: 1. Inferior wall scar. No ischemia. 2. Mild global hypokinesis and inferior wall motion abnormality. 3. Left ventricular ejection fraction 35% 4. Intermediate-risk stress test findings*.  2D ECHO Study Conclusions - Left ventricle: The cavity size was normal. Wall thickness wasincreased in a pattern of mild LVH. Systolic function wasmoderately to severely  reduced. The estimated ejection fractionwas in the range of 30% to 35%. Severe hypokinesis of theinferior myocardium. - Mitral valve: There was mild regurgitation. - Left atrium: The atrium was mildly  dilated. - Pulmonary arteries: PA peak pressure: 31 mm Hg (S). - Pericardium, extracardiac: A trivial pericardial effusion wasidentified. There was a left pleural effusion.  Antibiotics:  Vancomycin 1/25>>  Zosyn 1/25>>   Objective: Filed Vitals:   03/23/14 0347  BP: 108/69  Pulse: 102  Temp: 98.5 F (36.9 C)  Resp: 18    Intake/Output Summary (Last 24 hours) at 03/23/14 1112 Last data filed at 03/23/14 0830  Gross per 24 hour  Intake    340 ml  Output    100 ml  Net    240 ml   Filed Weights   03/20/14 0500 03/22/14 0505 03/23/14 0500  Weight: 36.288 kg (80 lb) 42.275 kg (93 lb 3.2 oz) 43 kg (94 lb 12.8 oz)    Exam: General: Alert and awake, oriented x3, not in any acute distress, CVS: S1-S2 clear, possible murmur. Chest: clear to auscultation bilaterally, no wheezing, rales or rhonchi;  Abdomen: soft, nontender, nondistended, quiet bowel sounds, scar in hypogastric region (pt says from suprapubic catheter) Extremities: Left foot second toe is black and necrotic; left foot is tender to light palpation. Pedal pulses difficult to palpate. No focal deficits  Data Reviewed: Basic Metabolic Panel:  Recent Labs Lab 03/17/14 1726 03/18/14 0600 03/19/14 0629 03/20/14 0840 03/23/14 0315  NA 141 140 131* 132* 131*  K 4.5 4.3 3.8 4.0 4.2  CL 104 102 96 95* 94*  CO2 GLUCOSE 126* 173* 144* 115* 157*  BUN 42* 48* 25* 36* 34*  CREATININE 3.63* 4.15* 2.65* 4.08* 4.68*  CALCIUM 9.0 8.6 7.9* 7.7* 8.0*  PHOS  --   --   --  5.2*  --    Liver Function Tests:  Recent Labs Lab 03/18/14 0600 03/20/14 0840  AST 21  --   ALT 35  --   ALKPHOS 113  --   BILITOT 0.5  --   PROT 6.1  --   ALBUMIN 2.1* 1.8*   CBC:  Recent Labs Lab 03/17/14 1726   03/19/14 0629 03/20/14 0840 03/21/14 1520 03/22/14 0350 03/23/14 0315  WBC 11.6*  < > 9.1 11.6* 10.4 9.0 8.6  NEUTROABS 10.4*  --   --   --   --   --   --   HGB 10.4*  < > 9.8* 9.7* 9.4* 8.8* 9.3*  HCT 32.9*  < > 31.4* 30.0* 30.2* 28.3* 30.1*  MCV 82.3  < > 81.3 82.0 81.6 81.3 82.7  PLT 177  < > 178 175 176 183 203  < > = values in this interval not displayed. CBG:  Recent Labs Lab 03/21/14 2043 03/22/14 1124 03/22/14 1620 03/22/14 2045 03/23/14 0611  GLUCAP 228* 116* 152* 165* 152*    Micro Recent Results (from the past 240 hour(s))  Culture, blood (single)     Status: None (Preliminary result)   Collection Time: 03/17/14  5:31 PM  Result Value Ref Range Status   Specimen Description BLOOD ARM RIGHT  Final   Special Requests BOTTLES DRAWN AEROBIC AND ANAEROBIC 5CC  Final   Culture   Final           BLOOD CULTURE RECEIVED NO GROWTH TO DATE CULTURE WILL BE HELD FOR 5 DAYS BEFORE ISSUING A FINAL NEGATIVE REPORT Performed at [facility]    Report Status PENDING  Incomplete  MRSA PCR Screening     Status: None   Collection Time: 03/21/14  7:23 AM  Result Value Ref Range Status  MRSA by PCR NEGATIVE NEGATIVE Final    Comment:        The GeneXpert MRSA Assay (FDA approved for NASAL specimens only), is one component of a comprehensive MRSA colonization surveillance program. It is not intended to diagnose MRSA infection nor to guide or monitor treatment for MRSA infections.   Clostridium Difficile by PCR     Status: None   Collection Time: 03/21/14  8:43 PM  Result Value Ref Range Status   C difficile by pcr NEGATIVE NEGATIVE Final     Studies: Nm Myocar Multi W/spect W/wall Motion / Ef  03/21/2014   CLINICAL DATA:  Chest pain.  Shortness of Breath.  Diabetes.  EXAM: MYOCARDIAL IMAGING WITH SPECT (REST AND PHARMACOLOGIC-STRESS)  GATED LEFT VENTRICULAR WALL MOTION STUDY  LEFT VENTRICULAR EJECTION FRACTION  TECHNIQUE: Standard myocardial SPECT imaging was  performed after resting intravenous injection of 10 mCi Tc-10m sestamibi. Subsequently, intravenous infusion of Lexiscan was performed under the supervision of the Cardiology staff. At peak effect of the drug, 30 mCi Tc-36m sestamibi was injected intravenously and standard myocardial SPECT imaging was performed. Quantitative gated imaging was also performed to evaluate left ventricular wall motion, and estimate left ventricular ejection fraction.  COMPARISON:  None.  FINDINGS: Perfusion: Decreased uptake in the inferior wall on both sets of imaging suggesting inferior wall scar. Associated wall motion abnormality. No reversible defects to suggest ischemia.  Wall Motion: Mild global hypokinesis and inferior wall motion abnormality.  Left Ventricular Ejection Fraction: 35 %  End diastolic volume 116 ml  End systolic volume 75 ml  IMPRESSION: 1. Inferior wall scar.  No ischemia.  2. Mild global hypokinesis and inferior wall motion abnormality.  3. Left ventricular ejection fraction 35%  4. Intermediate-risk stress test findings*.  *2012 Appropriate Use Criteria for Coronary Revascularization Focused Update: J Am Coll Cardiol. 2012;59(9):857-881. http://content.dementiazones.com.aspx?articleid=1201161   Electronically Signed   By: Loralie Champagne M.D.   On: 03/21/2014 15:34    Scheduled Meds: . aspirin EC  81 mg Oral Daily  . darbepoetin (ARANESP) injection - DIALYSIS  200 mcg Intravenous Q Tue-HD  . feeding supplement (RESOURCE BREEZE)  1 Container Oral TID WC  . ferric gluconate (FERRLECIT/NULECIT) IV  250 mg Intravenous Q T,Th,Sa-HD  . ferric gluconate (FERRLECIT/NULECIT) IV  250 mg Intravenous Once  . heparin  3,700 Units Dialysis Once in dialysis  . heparin  5,000 Units Subcutaneous 3 times per day  . insulin aspart  0-15 Units Subcutaneous TID WC  . insulin aspart  0-5 Units Subcutaneous QHS  . metoprolol succinate  12.5 mg Oral Daily  . piperacillin-tazobactam (ZOSYN)  IV  2.25 g Intravenous 3  times per day  . pravastatin  20 mg Oral q1800  . sevelamer carbonate  800 mg Oral TID WC  . vancomycin  500 mg Intravenous Q T,Th,Sa-HD  . vancomycin  500 mg Intravenous Once   Continuous Infusions:     Time spent: 20 minutes   [facility] Pager [phone]    If 7PM-7AM, please contact night-coverage at [email or web address], password [facility] 03/23/2014, 11:12 AM  LOS: 6 days

## 2014-03-24 ENCOUNTER — Encounter (HOSPITAL_COMMUNITY): Payer: Self-pay | Admitting: Physician Assistant

## 2014-03-24 ENCOUNTER — Encounter (HOSPITAL_COMMUNITY): Payer: Self-pay | Admitting: Anesthesiology

## 2014-03-24 ENCOUNTER — Encounter (HOSPITAL_COMMUNITY)
Admission: EM | Disposition: A | Discharge status: Skilled Nursing Facility | Payer: Self-pay | Source: Home / Self Care | Attending: Internal Medicine

## 2014-03-24 DIAGNOSIS — R9439 Abnormal result of other cardiovascular function study: Secondary | ICD-10-CM | POA: Insufficient documentation

## 2014-03-24 DIAGNOSIS — I519 Heart disease, unspecified: Secondary | ICD-10-CM | POA: Diagnosis present

## 2014-03-24 DIAGNOSIS — I255 Ischemic cardiomyopathy: Secondary | ICD-10-CM

## 2014-03-24 HISTORY — PX: LEFT AND RIGHT HEART CATHETERIZATION WITH CORONARY ANGIOGRAM: SHX5449

## 2014-03-24 LAB — POCT I-STAT 3, ART BLOOD GAS (G3+)
Bicarbonate: 26.2 mEq/L — ABNORMAL HIGH (ref 20.0–24.0)
O2 SAT: 97 %
PH ART: 7.335 — AB (ref 7.350–7.450)
TCO2: 28 mmol/L (ref 0–100)
pCO2 arterial: 49.1 mmHg — ABNORMAL HIGH (ref 35.0–45.0)
pO2, Arterial: 98 mmHg (ref 80.0–100.0)

## 2014-03-24 LAB — POCT I-STAT 3, VENOUS BLOOD GAS (G3P V)
Bicarbonate: 27.3 mEq/L — ABNORMAL HIGH (ref 20.0–24.0)
O2 Saturation: 62 %
PO2 VEN: 35 mmHg (ref 30.0–45.0)
TCO2: 29 mmol/L (ref 0–100)
pCO2, Ven: 53.2 mmHg — ABNORMAL HIGH (ref 45.0–50.0)
pH, Ven: 7.318 — ABNORMAL HIGH (ref 7.250–7.300)

## 2014-03-24 LAB — BASIC METABOLIC PANEL
Anion gap: 9 (ref 5–15)
BUN: 18 mg/dL (ref 6–23)
CALCIUM: 8 mg/dL — AB (ref 8.4–10.5)
CO2: 27 mmol/L (ref 19–32)
Chloride: 98 mmol/L (ref 96–112)
Creatinine, Ser: 3.19 mg/dL — ABNORMAL HIGH (ref 0.50–1.10)
GFR, EST AFRICAN AMERICAN: 16 mL/min — AB (ref >90)
GFR, EST NON AFRICAN AMERICAN: 14 mL/min — AB (ref >90)
GLUCOSE: 167 mg/dL — AB (ref 70–99)
POTASSIUM: 3.6 mmol/L (ref 3.5–5.1)
SODIUM: 134 mmol/L — AB (ref 135–145)

## 2014-03-24 LAB — CBC
HCT: 34.2 % — ABNORMAL LOW (ref 36.0–46.0)
HEMOGLOBIN: 10.5 g/dL — AB (ref 12.0–15.0)
MCH: 26.1 pg (ref 26.0–34.0)
MCHC: 30.7 g/dL (ref 30.0–36.0)
MCV: 84.9 fL (ref 78.0–100.0)
Platelets: 208 10*3/uL (ref 150–400)
RBC: 4.03 MIL/uL (ref 3.87–5.11)
RDW: 17.2 % — AB (ref 11.5–15.5)
WBC: 8.9 10*3/uL (ref 4.0–10.5)

## 2014-03-24 LAB — GLUCOSE, CAPILLARY
GLUCOSE-CAPILLARY: 103 mg/dL — AB (ref 70–99)
GLUCOSE-CAPILLARY: 221 mg/dL — AB (ref 70–99)
GLUCOSE-CAPILLARY: 93 mg/dL (ref 70–99)
Glucose-Capillary: 159 mg/dL — ABNORMAL HIGH (ref 70–99)
Glucose-Capillary: 131 mg/dL — ABNORMAL HIGH (ref 70–99)
Glucose-Capillary: 90 mg/dL (ref 70–99)
Glucose-Capillary: 171 mg/dL — ABNORMAL HIGH (ref 70–99)

## 2014-03-24 LAB — CULTURE, BLOOD (SINGLE): Culture: NO GROWTH

## 2014-03-24 LAB — PROTIME-INR
INR: 1.21 (ref 0.00–1.49)
Prothrombin Time: 15.4 seconds — ABNORMAL HIGH (ref 11.6–15.2)

## 2014-03-24 SURGERY — ENDARTERECTOMY, POPLITEAL
Anesthesia: General | Laterality: Left

## 2014-03-24 SURGERY — LEFT AND RIGHT HEART CATHETERIZATION WITH CORONARY ANGIOGRAM
Anesthesia: LOCAL

## 2014-03-24 MED ORDER — FENTANYL CITRATE 0.05 MG/ML IJ SOLN: 50.0000 ug | INTRAMUSCULAR | Status: DC | PRN

## 2014-03-24 MED ORDER — HEPARIN (PORCINE) IN NACL 2-0.9 UNIT/ML-% IJ SOLN
INTRAMUSCULAR | Status: AC
  Filled 2014-03-24: qty 1500

## 2014-03-24 MED ORDER — MIDAZOLAM HCL 2 MG/2ML IJ SOLN
INTRAMUSCULAR | Status: AC
  Filled 2014-03-24: qty 2

## 2014-03-24 MED ORDER — FENTANYL CITRATE 0.05 MG/ML IJ SOLN
INTRAMUSCULAR | Status: AC
  Filled 2014-03-24: qty 5

## 2014-03-24 MED ORDER — FENTANYL CITRATE 0.05 MG/ML IJ SOLN
INTRAMUSCULAR | Status: AC
  Filled 2014-03-24: qty 2

## 2014-03-24 MED ORDER — LIDOCAINE HCL (PF) 1 % IJ SOLN
INTRAMUSCULAR | Status: AC
  Filled 2014-03-24: qty 30

## 2014-03-24 MED ORDER — SODIUM CHLORIDE 0.9 % IV SOLN: INTRAVENOUS | Status: DC

## 2014-03-24 MED ORDER — NITROGLYCERIN 1 MG/10 ML FOR IR/CATH LAB
INTRA_ARTERIAL | Status: AC
  Filled 2014-03-24: qty 10

## 2014-03-24 MED ORDER — SODIUM CHLORIDE 0.9 % IV SOLN
1.0000 mL/kg/h | INTRAVENOUS | Status: AC
  Administered 2014-03-24: 1 mL/kg/h via INTRAVENOUS

## 2014-03-24 MED ORDER — MIDAZOLAM HCL 2 MG/2ML IJ SOLN: 1.0000 mg | INTRAMUSCULAR | Status: DC | PRN

## 2014-03-24 NOTE — Progress Notes (Signed)
Utilization review completed.  

## 2014-03-24 NOTE — H&P (View-Only) (Signed)
Patient: Elizabeth [name] / Admit Date: 03/17/2014 / Date of Encounter: 03/24/2014, 8:03 AM   Subjective: She says she is trying to get rest but keeps being interrupted. No CP or SOB. Does not have any questions about the procedure. Per patient, risks/benefits/alternatives already discussed. She does not care to hear them again.  Objective: Telemetry: NSR Physical Exam: Blood pressure 101/59, pulse 85, temperature 97.6 F (36.4 C), temperature source Oral, resp. rate 18, height 5' 3" (1.6 m), weight 82 lb 0.2 oz (37.2 kg), SpO2 94 %. General: Well developed thin F in no acute distress. Head: Normocephalic, atraumatic, sclera non-icteric, no xanthomas, nares are without discharge. Neck: JVP not elevated. Lungs: Clear bilaterally to auscultation without wheezes, rales, or rhonchi. Breathing is unlabored. Heart: RRR S1 S2 without murmurs, rubs, or gallops.  Abdomen: Soft, non-tender, non-distended with normoactive bowel sounds. No rebound/guarding. Extremities: No edema. L 2nd toe discolored. Neuro: Alert and oriented X 3. Moves all extremities spontaneously. Psych:  Responds to questions appropriately with a normal affect.   Intake/Output Summary (Last 24 hours) at 03/24/14 0803 Last data filed at 03/24/14 0600  Gross per 24 hour  Intake    328 ml  Output   2625 ml  Net  -2297 ml    Inpatient Medications:  . aspirin EC  81 mg Oral Daily  . darbepoetin (ARANESP) injection - DIALYSIS  200 mcg Intravenous Q Tue-HD  . feeding supplement (RESOURCE BREEZE)  1 Container Oral TID WC  . ferric gluconate (FERRLECIT/NULECIT) IV  250 mg Intravenous Q T,Th,Sa-HD  . heparin  3,700 Units Dialysis Once in dialysis  . heparin  5,000 Units Subcutaneous 3 times per day  . insulin aspart  0-15 Units Subcutaneous TID WC  . insulin aspart  0-5 Units Subcutaneous QHS  . metoprolol succinate  12.5 mg Oral Daily  . piperacillin-tazobactam (ZOSYN)  IV  2.25 g Intravenous 3 times per day  . pravastatin  20  mg Oral q1800  . sevelamer carbonate  800 mg Oral TID WC  . sodium chloride  3 mL Intravenous Q12H  . vancomycin  500 mg Intravenous Q T,Th,Sa-HD   Infusions:  . [START ON 03/25/2014] sodium chloride      Labs:  Recent Labs  03/23/14 0315 03/24/14 0550  NA 131* 134*  K 4.2 3.6  CL 94* 98  CO2 27 27  GLUCOSE 157* 167*  BUN 34* 18  CREATININE 4.68* 3.19*  CALCIUM 8.0* 8.0*   No results for input(s): AST, ALT, ALKPHOS, BILITOT, PROT, ALBUMIN in the last 72 hours.  Recent Labs  03/23/14 0315 03/24/14 0550  WBC 8.6 8.9  HGB 9.3* 10.5*  HCT 30.1* 34.2*  MCV 82.7 84.9  PLT 203 208   No results for input(s): CKTOTAL, CKMB, TROPONINI in the last 72 hours. Invalid input(s): POCBNP No results for input(s): HGBA1C in the last 72 hours.   Radiology/Studies:  Mr Foot Left Wo Contrast  03/18/2014   CLINICAL DATA:  Cellulitis of the left foot.  EXAM: MRI OF THE LEFT FOREFOOT WITHOUT CONTRAST  TECHNIQUE: Multiplanar, multisequence MR imaging was performed. No intravenous contrast was administered.  COMPARISON:  Radiographs dated 03/17/2014  FINDINGS: There is diffuse abnormal signal from the proximal phalanx of the second toe. The middle phalanx is exposed and devitalized with very little signal. The distal phalanx is missing. There is a large soft tissue defect at the stump of the second toe.  There is abnormal subcutaneous edema at the base of the second and   third toes particularly along the plantar aspect and extending in the web space between the first and second toes. No discrete abscess. There is no osteomyelitis of the other toes.  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  IMPRESSION: Devitalization of the middle phalanx of the second toe. Abnormal signal from the proximal phalanx of the second toe is most likely due being devitalized. There is no discrete destruction of the proximal phalanx.  Adjacent cellulitis. The distal phalanx of the second toe is  missing. Middle phalanx is devitalized.   Electronically Signed   By: Jim  Maxwell M.D.   On: 03/18/2014 07:49   Nm Myocar Multi W/spect W/wall Motion / Ef  03/21/2014   CLINICAL DATA:  Chest pain.  Shortness of Breath.  Diabetes.  EXAM: MYOCARDIAL IMAGING WITH SPECT (REST AND PHARMACOLOGIC-STRESS)  GATED LEFT VENTRICULAR WALL MOTION STUDY  LEFT VENTRICULAR EJECTION FRACTION  TECHNIQUE: Standard myocardial SPECT imaging was performed after resting intravenous injection of 10 mCi Tc-99m sestamibi. Subsequently, intravenous infusion of Lexiscan was performed under the supervision of the Cardiology staff. At peak effect of the drug, 30 mCi Tc-99m sestamibi was injected intravenously and standard myocardial SPECT imaging was performed. Quantitative gated imaging was also performed to evaluate left ventricular wall motion, and estimate left ventricular ejection fraction.  COMPARISON:  None.  FINDINGS: Perfusion: Decreased uptake in the inferior wall on both sets of imaging suggesting inferior wall scar. Associated wall motion abnormality. No reversible defects to suggest ischemia.  Wall Motion: Mild global hypokinesis and inferior wall motion abnormality.  Left Ventricular Ejection Fraction: 35 %  End diastolic volume 116 ml  End systolic volume 75 ml  IMPRESSION: 1. Inferior wall scar.  No ischemia.  2. Mild global hypokinesis and inferior wall motion abnormality.  3. Left ventricular ejection fraction 35%  4. Intermediate-risk stress test findings*.  *2012 Appropriate Use Criteria for Coronary Revascularization Focused Update: J Am Coll Cardiol. 2012;59(9):857-881. http://content.onlinejacc.org/article.aspx?articleid=1201161   Electronically Signed   By: Mark  Gallerani M.D.   On: 03/21/2014 15:34   Dg Foot Complete Left  03/17/2014   CLINICAL DATA:  Infection of second toe of left foot.  EXAM: LEFT FOOT - COMPLETE 3+ VIEW  COMPARISON:  None.  FINDINGS: Advanced vascular calcifications. Mild hallux valgus  deformity with osteoarthritis of the first metatarsal phalangeal joint. Soft tissue defect about the distal second digit. No gross osseous destruction identified. Extensive overlap of digits on the lateral view. No soft tissue gas.  IMPRESSION: Suspect soft tissue ulcer about the second digit, without gross osseous destruction. Dependent on clinical concern, consider contrast-enhanced MRI.   Electronically Signed   By: Kyle  Talbot M.D.   On: 03/17/2014 18:52     Assessment and Plan   1. Left toe gangrene with PVD - per primary team and vascular, plans for surgery (tibial endarterectomy and amputation)   2. ESRD - HD per renal TTS  3, Systolic heart failure/newly recognized cardiomyopathy in the context of pre-operative evaluation - echo this admit LVEF 30-35%, severe inferior hypokinesis. No old echoes to compare, nuclear study from 03/2012 showed LVEF 44% and evidence of inferior scar with mild peri-infarct ischemia. Coronaries were not studied at that time due to CKD, she has since started HD.  - overall findings consistent with likely ischemic cardiomyopathy. She has never had her coronaries previously studied, MPI shows inferior scar without active ischemia - BP tolerating low dose BB (started yesterday) - given degree of systolic dysfunction she needs a LHC/RHC   to better understand her cardiomyopathy and overall surgical risk based on coronary anatomy - this has been arranged for today. Risks/benefits/alternatives discussed and patient in agreement.  - Vascular note today: "Most likely will need to defer operation til later this week. Will follow up on cath results later today. Please keep pt NPO after cath in the event we are able to do procedure today."  4. HTN 5. Anemia 6. Dyslipidemia 7. 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 Severe protein calorie malnutrition 9. Diabetes mellitus with the above complications  Signed, Dayna Dunn PA-C   I have examined the  patient and reviewed assessment and plan and discussed with patient.  Agree with above as stated.  Will check with Dr. Fields if surgery could be performed on Plavix.  Maayan Jenning S.  

## 2014-03-24 NOTE — Progress Notes (Signed)
PA paged, pt request for CLD, unable to give to pt right now due to scheduled procedure today. Elizabeth [name] 8:54 AM

## 2014-03-24 NOTE — Progress Notes (Signed)
Inpatient Diabetes Program Recommendations  AACE/ADA: New Consensus Statement on Inpatient Glycemic Control (2013)  Target Ranges:  Prepandial:   less than 140 mg/dL      Peak postprandial:   less than 180 mg/dL (1-2 hours)      Critically ill patients:  140 - 180 mg/dL   Reason for Assessment:  Results for Elizabeth [name], Elizabeth [name] (MRN [id number]) as of 03/24/2014 12:53  Ref. Range 03/23/2014 06:11 03/23/2014 16:00 03/23/2014 21:17 03/24/2014 00:43 03/24/2014 06:23 03/24/2014 11:26  Glucose-Capillary Latest Range: 70-99 mg/dL 379 (H) 69 (L) 432 (H) 221 (H) 159 (H) 131 (H)    Consider reducing Novolog correction to sensitive tid with meals and HS.  Thanks, Beryl Meager, RN, BC-ADM Inpatient Diabetes Coordinator Pager [phone]

## 2014-03-24 NOTE — Progress Notes (Signed)
Medicare Important Message given? YES  (If response is "NO", the following Medicare IM given date fields will be blank)  Date Medicare IM given: 03/24/14 Medicare IM given by:  Andyn Sales  

## 2014-03-24 NOTE — Progress Notes (Signed)
Rt fa sheath pulled. 1536-[id number] fv sheath pulled by Tomasa Rand.

## 2014-03-24 NOTE — Progress Notes (Signed)
Manual pressure held to Rt Femoral artery and Rt Femoral Vein site for 20 minutes. Hemostasis achieved. Rt groin soft with no active bleeding or hematoma noted. Level 0.  VSS. 4x4 and tegaderm applied to Rt groin site. Post procedure bleeding precautions reviewed with patient. Patient states no pain at this time. Rt DP noted by Doppler. CBG at 1530 was 90.

## 2014-03-24 NOTE — CV Procedure (Signed)
CARDIAC CATHETERIZATION REPORT  NAME:  Elizabeth [name]   MRN: [id number] DOB:  07-06-1945   ADMIT DATE: 03/17/2014 Procedure Date: 03/24/2014  INTERVENTIONAL CARDIOLOGIST: Marykay Lex, M.D., MS PRIMARY CARE PROVIDER: [name], MD PRIMARY CARDIOLOGIST:  Hilty, Kenneth C ([place]), M.D.  PATIENT:  Elizabeth [name] is a 69 y.o. female with end-stage renal disease on dialysis, PAD, recent stroke as well as hypertension, type 2 diabetes and the severe anemia who is being evaluated for left foot/toe infection and found to have dry gangrene. She is undergoing evaluation for possible PV surgery. Cardiology was consult and for preoperative risk stratification for popliteal tibial endarterectomy and toe irritation. An echocardiogram was performed and showed reduced ejection fraction - reduced to 30-35% from 44%. She has a history of a Myoview in 2014 which showed a possible prior infarct in the inferior wall. The current cardiac review also shows inferior hypokinesis. Based on the reduction of ejection fraction and the prior Myoview being abnormal, she is referred for invasive evaluation to delineate her coronary anatomy as it was not done in the past.  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  Left Ventriculography  PROCEDURE: The patient was brought to the 2nd Floor [facility] in the fasting state and prepped and draped in the usual sterile fashion for right femoral arterial and venous access. Sterile technique was used including antiseptics, cap, gloves, gown, hand hygiene, mask and sheet. Skin prep: Chlorhexidine.   Consent: Risks of  procedure as well as the alternatives and risks of each were explained to the (patient/caregiver). Consent for procedure obtained.   Time Out: Verified patient identification, verified procedure, site/side was marked, verified correct patient position, special equipment/implants available, medications/allergies/relevent history reviewed, required imaging and test results available. Performed.  Access:  Right Common Femoral Artery: 5 Fr Sheath - fluoroscopically guided modified Seldinger Technique  Right Common Femoral Vein: 7 Fr Sheath - Seldinger Technique..  Right Heart Catheterization: 7 Fr Theone Murdoch catheter advanced under fluoroscopy with balloon inflated to the RA, RV, then PCWP-PA for hemodynamic measurement.  Simultaneous FA & PA blood gases checked for SaO2% to calculate FICK CO/CI  Thermodilution Injections performed to calculate CO/CI  Simultaneous PCWP/LV & RV/LV pressures monitored with Angled Pigtail in LV.  Catheter removed completely out of the body with balloon deflated.  Left Heart Catheterization: 5Fr Catheters advanced or exchanged over a J-wire under fluoroscopic guidance; angled Pigtasil catheter advanced first.  LV Hemodynamics: Angled Pigtail Left Coronary Artery Cineangiography: JL4 Catheter  Right Coronary Artery Cineangiography: JR4 Catheter.  Sheaths will be removed in the holding area with manual pressure for hemostasis.   EBL: < 10 ml, not including ABG and VBG samples   FINDINGS:  Hemodynamics:  Findings:   SaO2%  Pressures mmHg  Mean P  mmHg  EDP  mmHg   Right Atrium    9/8    6   Right Ventricle    43/3    8   Pulmonary Artery   62   46/16   29    PCWP    17/15   15    Central Aortic   97   107/53   75    Left  Ventricle    107/11    20          Cardiac Output:   Cardiac Index:    Fick   3.51    2.66    Thermodilution   3.23    2.45     Left Ventriculography:  EF: 30-35 % by echocardiogram with inferior hypokinesis.  Coronary  Anatomy:  Left Main: Normal caliber (moderate to large), very short vessel that bifurcates into the LAD and circumflex. Minimal calcification and no significant disease. LAD: Normal caliber vessel with eccentric diffuse 30-40% proximal disease prior to takeoff of the major diagonal branch. 50-60% eccentric diffuse disease in the mid vessel. It then tapers down around the apex perfusing the third of the inferoapex.  D1 : Moderate caliber vessel with a very proximal bifurcation. Roughly 40% ostial stenosis.   Left Circumflex: normal caliber, likely codominant vessel that bifurcates in the mid AV groove into a branching OM trunk and the AV groove circumflex which terminates as the posterior lateral branch. The OM trunk gives off a proximal moderate caliber OM1 and then bifurcates more distally into moderate caliber OM 2 and OM 3 branches that are relatively free of disease.. The ostium of the OM1 branch has a roughly 50-60% stenosis. The AV groove circumflex that terminates as a left posterolateral branch is free of significant disease.   RCA:  Moderate caliber, codominant vessel with diffuse mild luminal irregularities. It terminates distally as the Right Posterior Descending Artery (RPDA) that does not reach the apex. There is very minimal right posterolateral system.   MEDICATIONS:  Anesthesia:  Local Lidocaine 16 ml  Sedation:  25  mcg IV fentanyl ;   Omnipaque Contrast: 50 mL  PATIENT DISPOSITION:    The patient was transferred to the PACU holding area in a hemodynamicaly stable, chest pain free condition.  The patient tolerated the procedure well, and there were no complications.  EBL:   < 10ml  The patient was stable before, during, and after the procedure.  POST-OPERATIVE DIAGNOSIS:LAN OF CARE:  Angiographically only moderate disease involving the proximal and mid LAD as well as the ostium of OM1.  No obvious culprit lesion to explain inferior wall motion abnormalities and  severely reduced EF as noted by echocardiogram.  Suspect nonischemic cardiomyopathy.  Moderately-severely reduced Ejection Fraction (EF) with commensurate reduction in cardiac output and index as noted by both Fick and thermodilution.  From strictly CAD standpoint, there should be no occlusion for the patient proceeding with planned PV surgery   Elizabeth [name] Lacretia Nicks, M.D., M.S. [facility] [address] [place], [place]  [address]  [phone]  03/24/2014 3:15 PM

## 2014-03-24 NOTE — Progress Notes (Signed)
Pt still awaiting cardiac cath later today.  Will cancel operation on toe/leg for today and follow up with pt tomorrow regarding plans for foot.  Ok to start diet after cath if appropriate from cardiology standpoint.  Fabienne Bruns, MD [facility] Office: [phone] Pager: [phone]

## 2014-03-24 NOTE — Progress Notes (Signed)
Problem/Plan:  1. L foot ischemia / early gangrene - for surgery by [facility] after card eval completed 2. Systolic heart failure - inf scar, no old echos, EF 30-35%. Cardiology planning heart cath today.  3. ESRD - TTS HD, next HD in AM 4. Hypertension/volume - BP's low , holding coreg, at dry wt, got fluid today for hypotension and tolerated well  Subjective: Interval History: Stable today with plan outline as above  Objective: Vital signs in last 24 hours: Temp:  [97 F (36.1 C)-97.6 F (36.4 C)] 97.6 F (36.4 C) (02/01 0535) Pulse Rate:  [76-99] 86 (02/01 0958) Resp:  [11-18] 18 (02/01 0535) BP: (100-114)/(57-68) 107/61 mmHg (02/01 0958) SpO2:  [94 %-98 %] 94 % (02/01 0535) Weight:  [36.2 kg (79 lb 12.9 oz)-37.2 kg (82 lb 0.2 oz)] 37.2 kg (82 lb 0.2 oz) (02/01 0535) Weight change: -4.3 kg (-9 lb 7.7 oz)  Intake/Output from previous day: 01/31 0701 - 02/01 0700 In: 328 [P.O.:228; IV Piggyback:100] Out: 2625 [Urine:125] Intake/Output this shift:    General appearance: alert and cooperative Head: Normocephalic, without obvious abnormality, atraumatic Resp: clear to auscultation bilaterally GI: soft, non-tender; bowel sounds normal; no masses,  no organomegaly  Lab Results:  Recent Labs  03/23/14 0315 03/24/14 0550  WBC 8.6 8.9  HGB 9.3* 10.5*  HCT 30.1* 34.2*  PLT 203 208   BMET:  Recent Labs  03/23/14 0315 03/24/14 0550  NA 131* 134*  K 4.2 3.6  CL 94* 98  CO2 27 27  GLUCOSE 157* 167*  BUN 34* 18  CREATININE 4.68* 3.19*  CALCIUM 8.0* 8.0*   No results for input(s): PTH in the last 72 hours. Iron Studies: No results for input(s): IRON, TIBC, TRANSFERRIN, FERRITIN in the last 72 hours. Studies/Results: No results found.  Scheduled: . aspirin EC  81 mg Oral Daily  . darbepoetin (ARANESP) injection - DIALYSIS  200 mcg Intravenous Q Tue-HD  . feeding supplement (RESOURCE BREEZE)  1 Container Oral TID WC  . ferric gluconate  (FERRLECIT/NULECIT) IV  250 mg Intravenous Q T,Th,Sa-HD  . heparin  3,700 Units Dialysis Once in dialysis  . heparin  5,000 Units Subcutaneous 3 times per day  . insulin aspart  0-15 Units Subcutaneous TID WC  . insulin aspart  0-5 Units Subcutaneous QHS  . metoprolol succinate  12.5 mg Oral Daily  . piperacillin-tazobactam (ZOSYN)  IV  2.25 g Intravenous 3 times per day  . pravastatin  20 mg Oral q1800  . sevelamer carbonate  800 mg Oral TID WC  . sodium chloride  3 mL Intravenous Q12H  . vancomycin  500 mg Intravenous Q T,Th,Sa-HD      LOS: 7 days   Gates Jividen C 03/24/2014,11:46 AM

## 2014-03-24 NOTE — Progress Notes (Signed)
Patient diaphoric skin warm accu. 221 and v.s. Stable. Cont. To monitor patient. No complaints voice.

## 2014-03-24 NOTE — Progress Notes (Signed)
[facility] PROGRESS NOTE   Elizabeth [name] ZOX:[id number] DOB: Sep 11, 1945 DOA: 03/17/2014 PCP: [name], MD  HPI: The patient presented with complaints of infection ofLeft foot toe. She was found to have gangrenous changes. She was subsequently admitted to the hospital. Vascular surgery was consulted. She underwent arteriogram. Plan is for surgical intervention if cleared by cardiology. She underwent stress test on 1/29. Plan is for cardiac catheterization 2/1.  Subjective: Patient continues to feel the same. No questions today. Waiting for her procedure.   Assessment/Plan: Principal Problem:   Cellulitis of foot Active Problems:   HLD (hyperlipidemia)   Anemia of chronic disease   End stage renal disease   Protein-calorie malnutrition, severe   Neuropathy   Ulcer of toe of left foot   Encounter for preoperative examination for general surgical procedure, vascular   Preop cardiovascular exam   DM type 2 (diabetes mellitus, type 2)   Black toe   Arterial hypotension   Cellulitis of Left foot/Ulcer of Toe of Left Foot -Due to peripheral vascular disease and diabetes. -MRI w/o contrast of left foot showed "Devitalization of the middle phalanx of the second toe. Abnormal signal from the proximal phalanx of the second toe is most likely due being devitalized. There is no discrete destruction of the proximal phalanx. Adjacent cellulitis. The distal phalanx of the second toe is missing." -Bilateral lower extremity venous duplex showed no evidence of DVT, superficial thrombosis, or Baker's cyst. -Continue vancomycin and zosyn until we determine the long-term plan. Blood cultures negative so far. -Patient underwent arteriogram 1/27. Vascular surgery is planning popliteal tibial endarterectomy and toe amputation. But await clearance from cardiology. Cardiology consulted for preoperative cardiac evaluation. Stress test was intermediate risk for ischemia. Echocardiogram reveals  diminished ejection fraction, inferior hypokinesis. Plan is for a cardiac catheterization today.   Chronic systolic dysfunction  Seen on stress test and echocardiogram. Plan is for catheterization today. Cardiology is following. She is well compensated clinically possibly because of dialysis.  Diarrhea Appears to have resolved. C. difficile is negative. Can give Imodium if she has recurrence.  DM Type 2, uncontrolled Continue NovoLOG on sliding scale. Monitor CBGs.  End stage renal disease TTS/Hyponatremia -Renal failure likely due to diabetes -Management per Nephrology.    Hyperlipidemia -Continue pravastatin.   History of essential Hypertension with episodes of hypotension Blood pressure remains stable. Continue to monitor for now. Hold all blood pressure lowering agents.   Anemia of Chronic disease -Hgb remains stable.  Severe protein calorie malnutrition Continue with protein supplements.  DVT Prophylaxis: Heparin Code Status: Full Code Family Communication: Plan discussed with the patient and her granddaughter over the phone. Disposition Plan: Not ready for discharge  Consultants:  Vascular surgery  Nephrology  Cardiology  Procedures: Arteriogram 03/19/2014 Operative findings: #1 occlusion anterior tibial and posterior tibial arteries bilaterally   #2 90% stenosis origin left peroneal artery   #3 one vessel runoff right peroneal with in-line flow  Nuclear stress test 1/29 IMPRESSION: 1. Inferior wall scar. No ischemia. 2. Mild global hypokinesis and inferior wall motion abnormality. 3. Left ventricular ejection fraction 35% 4. Intermediate-risk stress test findings*.  2D ECHO Study Conclusions - Left ventricle: The cavity size was normal. Wall thickness wasincreased in a pattern of mild LVH. Systolic function wasmoderately to severely reduced. The estimated ejection fractionwas in the range  of 30% to 35%. Severe hypokinesis of theinferior myocardium. - Mitral valve: There was mild regurgitation. - Left atrium: The atrium was mildly dilated. - Pulmonary arteries: PA peak pressure:  31 mm Hg (S). - Pericardium, extracardiac: A trivial pericardial effusion wasidentified. There was a left pleural effusion.  Antibiotics:  Vancomycin 1/25>>  Zosyn 1/25>>   Objective: Filed Vitals:   03/24/14 0958  BP: 107/61  Pulse: 86  Temp:   Resp:     Intake/Output Summary (Last 24 hours) at 03/24/14 1146 Last data filed at 03/24/14 0600  Gross per 24 hour  Intake    228 ml  Output   2625 ml  Net  -2397 ml   Filed Weights   03/23/14 1140 03/23/14 1512 03/24/14 0535  Weight: 38.7 kg (85 lb 5.1 oz) 36.2 kg (79 lb 12.9 oz) 37.2 kg (82 lb 0.2 oz)    Exam: General: Alert and awake, oriented x3, not in any acute distress, CVS: S1-S2 clear, possible murmur. Chest: clear to auscultation bilaterally, no wheezing, rales or rhonchi;  Abdomen: soft, nontender, nondistended, quiet bowel sounds, scar in hypogastric region (pt says from suprapubic catheter) Extremities: Left foot second toe is black and necrotic; left foot is tender to light palpation. Pedal pulses difficult to palpate. No focal deficits  Data Reviewed: Basic Metabolic Panel:  Recent Labs Lab 03/18/14 0600 03/19/14 0629 03/20/14 0840 03/23/14 0315 03/24/14 0550  NA 140 131* 132* 131* 134*  K 4.3 3.8 4.0 4.2 3.6  CL 102 96 95* 94* 98  CO2 27 30 28 27 27   GLUCOSE 173* 144* 115* 157* 167*  BUN 48* 25* 36* 34* 18  CREATININE 4.15* 2.65* 4.08* 4.68* 3.19*  CALCIUM 8.6 7.9* 7.7* 8.0* 8.0*  PHOS  --   --  5.2*  --   --    Liver Function Tests:  Recent Labs Lab 03/18/14 0600 03/20/14 0840  AST 21  --   ALT 35  --   ALKPHOS 113  --   BILITOT 0.5  --   PROT 6.1  --   ALBUMIN 2.1* 1.8*   CBC:  Recent Labs Lab 03/17/14 1726  03/20/14 0840 03/21/14 1520 03/22/14 0350 03/23/14 0315 03/24/14 0550    WBC 11.6*  < > 11.6* 10.4 9.0 8.6 8.9  NEUTROABS 10.4*  --   --   --   --   --   --   HGB 10.4*  < > 9.7* 9.4* 8.8* 9.3* 10.5*  HCT 32.9*  < > 30.0* 30.2* 28.3* 30.1* 34.2*  MCV 82.3  < > 82.0 81.6 81.3 82.7 84.9  PLT 177  < > 175 176 183 203 208  < > = values in this interval not displayed. CBG:  Recent Labs Lab 03/23/14 1600 03/23/14 2117 03/24/14 0043 03/24/14 0623 03/24/14 1126  GLUCAP 69* 259* 221* 159* 131*    Micro Recent Results (from the past 240 hour(s))  Culture, blood (single)     Status: None   Collection Time: 03/17/14  5:31 PM  Result Value Ref Range Status   Specimen Description BLOOD ARM RIGHT  Final   Special Requests BOTTLES DRAWN AEROBIC AND ANAEROBIC 5CC  Final   Culture   Final    NO GROWTH 5 DAYS Performed at [facility]    Report Status 03/24/2014 FINAL  Final  MRSA PCR Screening     Status: None   Collection Time: 03/21/14  7:23 AM  Result Value Ref Range Status   MRSA by PCR NEGATIVE NEGATIVE Final    Comment:        The GeneXpert MRSA Assay (FDA approved for NASAL specimens only), is one component of a comprehensive  MRSA colonization surveillance program. It is not intended to diagnose MRSA infection nor to guide or monitor treatment for MRSA infections.   Clostridium Difficile by PCR     Status: None   Collection Time: 03/21/14  8:43 PM  Result Value Ref Range Status   C difficile by pcr NEGATIVE NEGATIVE Final     Studies: No results found.  Scheduled Meds: . aspirin EC  81 mg Oral Daily  . darbepoetin (ARANESP) injection - DIALYSIS  200 mcg Intravenous Q Tue-HD  . feeding supplement (RESOURCE BREEZE)  1 Container Oral TID WC  . ferric gluconate (FERRLECIT/NULECIT) IV  250 mg Intravenous Q T,Th,Sa-HD  . heparin  3,700 Units Dialysis Once in dialysis  . heparin  5,000 Units Subcutaneous 3 times per day  . insulin aspart  0-15 Units Subcutaneous TID WC  . insulin aspart  0-5 Units Subcutaneous QHS  . metoprolol  succinate  12.5 mg Oral Daily  . piperacillin-tazobactam (ZOSYN)  IV  2.25 g Intravenous 3 times per day  . pravastatin  20 mg Oral q1800  . sevelamer carbonate  800 mg Oral TID WC  . sodium chloride  3 mL Intravenous Q12H  . vancomycin  500 mg Intravenous Q T,Th,Sa-HD   Continuous Infusions: . [START ON 03/25/2014] sodium chloride       Time spent: 20 minutes   [facility] Pager [phone]    If 7PM-7AM, please contact night-coverage at [email or web address], password [email or web address] 03/24/2014, 11:46 AM  LOS: 7 days

## 2014-03-24 NOTE — Progress Notes (Signed)
Patient: Elizabeth [name] / Admit Date: 03/17/2014 / Date of Encounter: 03/24/2014, 8:03 AM   Subjective: She says she is trying to get rest but keeps being interrupted. No CP or SOB. Does not have any questions about the procedure. Per patient, risks/benefits/alternatives already discussed. She does not care to hear them again.  Objective: Telemetry: NSR Physical Exam: Blood pressure 101/59, pulse 85, temperature 97.6 F (36.4 C), temperature source Oral, resp. rate 18, height  (1.6 m), weight 82 lb 0.2 oz (37.2 kg), SpO2 94 %. General: Well developed thin F in no acute distress. Head: Normocephalic, atraumatic, sclera non-icteric, no xanthomas, nares are without discharge. Neck: JVP not elevated. Lungs: Clear bilaterally to auscultation without wheezes, rales, or rhonchi. Breathing is unlabored. Heart: RRR S1 S2 without murmurs, rubs, or gallops.  Abdomen: Soft, non-tender, non-distended with normoactive bowel sounds. No rebound/guarding. Extremities: No edema. L 2nd toe discolored. Neuro: Alert and oriented X 3. Moves all extremities spontaneously. Psych:  Responds to questions appropriately with a normal affect.   Intake/Output Summary (Last 24 hours) at 03/24/14 0803 Last data filed at 03/24/14 0600  Gross per 24 hour  Intake    328 ml  Output   2625 ml  Net  -2297 ml    Inpatient Medications:  . aspirin EC  81 mg Oral Daily  . darbepoetin (ARANESP) injection - DIALYSIS  200 mcg Intravenous Q Tue-HD  . feeding supplement (RESOURCE BREEZE)  1 Container Oral TID WC  . ferric gluconate (FERRLECIT/NULECIT) IV  250 mg Intravenous Q T,Th,Sa-HD  . heparin  3,700 Units Dialysis Once in dialysis  . heparin  5,000 Units Subcutaneous 3 times per day  . insulin aspart  0-15 Units Subcutaneous TID WC  . insulin aspart  0-5 Units Subcutaneous QHS  . metoprolol succinate  12.5 mg Oral Daily  . piperacillin-tazobactam (ZOSYN)  IV  2.25 g Intravenous 3 times per day  . pravastatin  20  mg Oral q1800  . sevelamer carbonate  800 mg Oral TID WC  . sodium chloride  3 mL Intravenous Q12H  . vancomycin  500 mg Intravenous Q T,Th,Sa-HD   Infusions:  . [START ON 03/25/2014] sodium chloride      Labs:  Recent Labs  03/23/14 0315 03/24/14 0550  NA 131* 134*  K 4.2 3.6  CL 94* 98  CO2 27 27  GLUCOSE 157* 167*  BUN 34* 18  CREATININE 4.68* 3.19*  CALCIUM 8.0* 8.0*   No results for input(s): AST, ALT, ALKPHOS, BILITOT, PROT, ALBUMIN in the last 72 hours.  Recent Labs  03/23/14 0315 03/24/14 0550  WBC 8.6 8.9  HGB 9.3* 10.5*  HCT 30.1* 34.2*  MCV 82.7 84.9  PLT 203 208   No results for input(s): CKTOTAL, CKMB, TROPONINI in the last 72 hours. Invalid input(s): POCBNP No results for input(s): HGBA1C in the last 72 hours.   Radiology/Studies:  Mr Foot Left Wo Contrast  03/18/2014   CLINICAL DATA:  Cellulitis of the left foot.  EXAM: MRI OF THE LEFT FOREFOOT WITHOUT CONTRAST  TECHNIQUE: Multiplanar, multisequence MR imaging was performed. No intravenous contrast was administered.  COMPARISON:  Radiographs dated 03/17/2014  FINDINGS: There is diffuse abnormal signal from the proximal phalanx of the second toe. The middle phalanx is exposed and devitalized with very little signal. The distal phalanx is missing. There is a large soft tissue defect at the stump of the second toe.  There is abnormal subcutaneous edema at the base of the second and  third toes particularly along the plantar aspect and extending in the web space between the first and second toes. No discrete abscess. There is no osteomyelitis of the other toes.  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  IMPRESSION: Devitalization of the middle phalanx of the second toe. Abnormal signal from the proximal phalanx of the second toe is most likely due being devitalized. There is no discrete destruction of the proximal phalanx.  Adjacent cellulitis. The distal phalanx of the second toe is  missing. Middle phalanx is devitalized.   Electronically Signed   By: Geanie Cooley M.D.   On: 03/18/2014 07:49   Nm Myocar Multi W/spect W/wall Motion / Ef  03/21/2014   CLINICAL DATA:  Chest pain.  Shortness of Breath.  Diabetes.  EXAM: MYOCARDIAL IMAGING WITH SPECT (REST AND PHARMACOLOGIC-STRESS)  GATED LEFT VENTRICULAR WALL MOTION STUDY  LEFT VENTRICULAR EJECTION FRACTION  TECHNIQUE: Standard myocardial SPECT imaging was performed after resting intravenous injection of 10 mCi Tc-46m sestamibi. Subsequently, intravenous infusion of Lexiscan was performed under the supervision of the Cardiology staff. At peak effect of the drug, 30 mCi Tc-64m sestamibi was injected intravenously and standard myocardial SPECT imaging was performed. Quantitative gated imaging was also performed to evaluate left ventricular wall motion, and estimate left ventricular ejection fraction.  COMPARISON:  None.  FINDINGS: Perfusion: Decreased uptake in the inferior wall on both sets of imaging suggesting inferior wall scar. Associated wall motion abnormality. No reversible defects to suggest ischemia.  Wall Motion: Mild global hypokinesis and inferior wall motion abnormality.  Left Ventricular Ejection Fraction: 35 %  End diastolic volume 116 ml  End systolic volume 75 ml  IMPRESSION: 1. Inferior wall scar.  No ischemia.  2. Mild global hypokinesis and inferior wall motion abnormality.  3. Left ventricular ejection fraction 35%  4. Intermediate-risk stress test findings*.  *2012 Appropriate Use Criteria for Coronary Revascularization Focused Update: J Am Coll Cardiol. 2012;59(9):857-881. http://content.dementiazones.com.aspx?articleid=1201161   Electronically Signed   By: Loralie Champagne M.D.   On: 03/21/2014 15:34   Dg Foot Complete Left  03/17/2014   CLINICAL DATA:  Infection of second toe of left foot.  EXAM: LEFT FOOT - COMPLETE 3+ VIEW  COMPARISON:  None.  FINDINGS: Advanced vascular calcifications. Mild hallux valgus  deformity with osteoarthritis of the first metatarsal phalangeal joint. Soft tissue defect about the distal second digit. No gross osseous destruction identified. Extensive overlap of digits on the lateral view. No soft tissue gas.  IMPRESSION: Suspect soft tissue ulcer about the second digit, without gross osseous destruction. Dependent on clinical concern, consider contrast-enhanced MRI.   Electronically Signed   By: Jeronimo Greaves M.D.   On: 03/17/2014 18:52     Assessment and Plan   1. Left toe gangrene with PVD - per primary team and vascular, plans for surgery (tibial endarterectomy and amputation)   2. ESRD - HD per renal TTS  3, Systolic heart failure/newly recognized cardiomyopathy in the context of pre-operative evaluation - echo this admit LVEF 30-35%, severe inferior hypokinesis. No old echoes to compare, nuclear study from 03/2012 showed LVEF 44% and evidence of inferior scar with mild peri-infarct ischemia. Coronaries were not studied at that time due to CKD, she has since started HD.  - overall findings consistent with likely ischemic cardiomyopathy. She has never had her coronaries previously studied, MPI shows inferior scar without active ischemia - BP tolerating low dose BB (started yesterday) - given degree of systolic dysfunction she needs a LHC/RHC  to better understand her cardiomyopathy and overall surgical risk based on coronary anatomy - this has been arranged for today. Risks/benefits/alternatives discussed and patient in agreement.  - Vascular note today: "Most likely will need to defer operation til later this week. Will follow up on cath results later today. Please keep pt NPO after cath in the event we are able to do procedure today."  4. HTN 5. Anemia 6. Dyslipidemia 7. 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 Severe protein calorie malnutrition 9. Diabetes mellitus with the above complications  Signed, Dayna Dunn PA-C   I have examined the  patient and reviewed assessment and plan and discussed with patient.  Agree with above as stated.  Will check with Dr. Darrick Penna if surgery could be performed on Plavix.  Rafaelita Foister S.

## 2014-03-24 NOTE — Progress Notes (Signed)
Cardiac cath planned for today.  Most likely will need to defer operation til later this week.  Will follow up on cath results later today.  Please keep pt NPO after cath in the event we are able to do procedure today.  Fabienne Bruns, MD [facility] Office: [phone] Pager: [phone]

## 2014-03-24 NOTE — Progress Notes (Signed)
Updated patient nurse on 2W of patient status. Patient resting comfortably at this time. No pain at this time. VSS. Rt groin remains soft with no active bleeding or hematoma noted. Patient will be transferred at this time to 2W.

## 2014-03-24 NOTE — Interval H&P Note (Signed)
History and Physical Interval Note:  03/24/2014 2:24 PM  Elizabeth [name]  has presented today for surgery, with the diagnosis of pre op clearence - Abnormal Echocardiogram and Nuclear Stress Test suggesting Ischemic Myoview.  The various methods of treatment have been discussed with the patient and family. After consideration of risks, benefits and other options for treatment, the patient has consented to  Procedure(s): LEFT AND RIGHT HEART CATHETERIZATION WITH CORONARY ANGIOGRAM (N/A)  +/- PCI as a surgical intervention .  The patient's history has been reviewed, patient examined, no change in status, stable for surgery.  I have reviewed the patient's chart and labs.  Questions were answered to the patient's satisfaction.    Cath Lab Visit (complete for each Cath Lab visit)  Clinical Evaluation Leading to the Procedure:   ACS: No.  Non-ACS:    Anginal Classification: CCS I  Anti-ischemic medical therapy: Minimal Therapy (1 class of medications)  Non-Invasive Test Results: Intermediate-risk stress test findings: cardiac mortality 1-3%/year  - EF ~30-35% reduced from prior evaluation, H/o inferior Scar on Myoview.  Prior CABG: No previous CABG  Abdirahim Flavell W  AUC For Cath. Cardiomyopathies (Right and Left Heart Catheterization OR  Right Heart Catheterization Alone With/Without Left Ventriculography and Coronary Angiography)   Patient Information:    Known or suspected cardiomyopathy with or without heart failure  AUC Score:   A (7)   Indication:   93   AUC for PCI  Ischemic Symptoms? CCS I (Ordinary physical activity does not cause anginal symptoms) Anti-ischemic Medical Therapy? Minimal Therapy (1 class of medications) Non-invasive Test Results? Intermediate-risk stress test findings: cardiac mortality 1-3%/year Prior CABG? No Previous CABG   Patient Information:   1-2V CAD, no prox LAD  U (5)  Indication: 16; Score: 5   Patient Information:   CTO of 1 vessel,  no other CAD  U (4)  Indication: 26; Score: 4   Patient Information:   1V CAD with prox LAD  U (6)  Indication: 32; Score: 6   Patient Information:   2V-CAD with prox LAD  A (7)  Indication: 38; Score: 7   Patient Information:   3V-CAD without LMCA  A (7)  Indication: 44; Score: 7   Patient Information:   3V-CAD without LMCA With Abnormal LV systolic function  A (9)  Indication: 48; Score: 9   Patient Information:   LMCA-CAD  A (9)  Indication: 49; Score: 9   Patient Information:   2V-CAD with prox LAD PCI  A (7)  Indication: 62; Score: 7   Patient Information:   2V-CAD with prox LAD CABG  A (8)  Indication: 62; Score: 8   Patient Information:   3V-CAD without LMCA With Low CAD burden(i.e., 3 focal stenoses, low SYNTAX score) PCI  A (7)  Indication: 63; Score: 7   Patient Information:   3V-CAD without LMCA With Low CAD burden(i.e., 3 focal stenoses, low SYNTAX score) CABG  A (9)  Indication: 63; Score: 9   Patient Information:   3V-CAD without LMCA E06c - Intermediate-high CAD burden (i.e., multiple diffuse lesions, presence of CTO, or high SYNTAX score) PCI  U (4)  Indication: 64; Score: 4   Patient Information:   3V-CAD without LMCA E06c - Intermediate-high CAD burden (i.e., multiple diffuse lesions, presence of CTO, or high SYNTAX score) CABG  A (9)  Indication: 64; Score: 9   Patient Information:   LMCA-CAD With Isolated LMCA stenosis  PCI  U (6)  Indication: 65; Score: 6  Patient Information:   LMCA-CAD With Isolated LMCA stenosis  CABG  A (9)  Indication: 65; Score: 9   Patient Information:   LMCA-CAD Additional CAD, low CAD burden (i.e., 1- to 2-vessel additional involvement, low SYNTAX score) PCI  U (5)  Indication: 66; Score: 5   Patient Information:   LMCA-CAD Additional CAD, low CAD burden (i.e., 1- to 2-vessel additional involvement, low SYNTAX score) CABG  A (9)  Indication: 66;  Score: 9   Patient Information:   LMCA-CAD Additional CAD, intermediate-high CAD burden (i.e., 3-vessel involvement, presence of CTO, or high SYNTAX score) PCI  I (3)  Indication: 67; Score: 3   Patient Information:   LMCA-CAD Additional CAD, intermediate-high CAD burden (i.e., 3-vessel involvement, presence of CTO, or high SYNTAX score) CABG  A (9)  Indication: 67; Score: 9

## 2014-03-25 ENCOUNTER — Encounter (HOSPITAL_COMMUNITY): Payer: Self-pay | Admitting: Cardiology

## 2014-03-25 DIAGNOSIS — I2510 Atherosclerotic heart disease of native coronary artery without angina pectoris: Secondary | ICD-10-CM

## 2014-03-25 DIAGNOSIS — I70462 Atherosclerosis of autologous vein bypass graft(s) of the extremities with gangrene, left leg: Secondary | ICD-10-CM

## 2014-03-25 DIAGNOSIS — I519 Heart disease, unspecified: Secondary | ICD-10-CM

## 2014-03-25 DIAGNOSIS — R931 Abnormal findings on diagnostic imaging of heart and coronary circulation: Secondary | ICD-10-CM

## 2014-03-25 LAB — CBC
HEMATOCRIT: 30.9 % — AB (ref 36.0–46.0)
Hemoglobin: 9.6 g/dL — ABNORMAL LOW (ref 12.0–15.0)
MCH: 25.9 pg — ABNORMAL LOW (ref 26.0–34.0)
MCHC: 31.1 g/dL (ref 30.0–36.0)
MCV: 83.5 fL (ref 78.0–100.0)
PLATELETS: 227 10*3/uL (ref 150–400)
RBC: 3.7 MIL/uL — AB (ref 3.87–5.11)
RDW: 17.5 % — ABNORMAL HIGH (ref 11.5–15.5)
WBC: 10.4 10*3/uL (ref 4.0–10.5)

## 2014-03-25 LAB — RENAL FUNCTION PANEL
Albumin: 1.5 g/dL — ABNORMAL LOW (ref 3.5–5.2)
Anion gap: 10 (ref 5–15)
BUN: 29 mg/dL — ABNORMAL HIGH (ref 6–23)
CHLORIDE: 96 mmol/L (ref 96–112)
CO2: 28 mmol/L (ref 19–32)
Calcium: 7.9 mg/dL — ABNORMAL LOW (ref 8.4–10.5)
Creatinine, Ser: 4.8 mg/dL — ABNORMAL HIGH (ref 0.50–1.10)
GFR, EST AFRICAN AMERICAN: 10 mL/min — AB (ref >90)
GFR, EST NON AFRICAN AMERICAN: 8 mL/min — AB (ref >90)
GLUCOSE: 238 mg/dL — AB (ref 70–99)
POTASSIUM: 3.5 mmol/L (ref 3.5–5.1)
Phosphorus: 4.6 mg/dL (ref 2.3–4.6)
Sodium: 134 mmol/L — ABNORMAL LOW (ref 135–145)

## 2014-03-25 LAB — GLUCOSE, CAPILLARY
Glucose-Capillary: 123 mg/dL — ABNORMAL HIGH (ref 70–99)
Glucose-Capillary: 166 mg/dL — ABNORMAL HIGH (ref 70–99)
Glucose-Capillary: 131 mg/dL — ABNORMAL HIGH (ref 70–99)
Glucose-Capillary: 188 mg/dL — ABNORMAL HIGH (ref 70–99)

## 2014-03-25 LAB — VANCOMYCIN, RANDOM: Vancomycin Rm: 41.8 ug/mL

## 2014-03-25 MED ORDER — ALTEPLASE 2 MG IJ SOLR: 2.0000 mg | Freq: Once | INTRAMUSCULAR | Status: DC | PRN

## 2014-03-25 MED ORDER — LIDOCAINE-PRILOCAINE 2.5-2.5 % EX CREA: 1.0000 "application " | TOPICAL_CREAM | CUTANEOUS | Status: DC | PRN

## 2014-03-25 MED ORDER — HEPARIN SODIUM (PORCINE) 1000 UNIT/ML DIALYSIS: 20.0000 [IU]/kg | INTRAMUSCULAR | Status: DC | PRN

## 2014-03-25 MED ORDER — NEPRO/CARBSTEADY PO LIQD: 237.0000 mL | ORAL | Status: DC | PRN

## 2014-03-25 MED ORDER — SODIUM CHLORIDE 0.9 % IV SOLN: 100.0000 mL | INTRAVENOUS | Status: DC | PRN

## 2014-03-25 MED ORDER — PENTAFLUOROPROP-TETRAFLUOROETH EX AERO: 1.0000 "application " | INHALATION_SPRAY | CUTANEOUS | Status: DC | PRN

## 2014-03-25 MED ORDER — LIDOCAINE HCL (PF) 1 % IJ SOLN: 5.0000 mL | INTRAMUSCULAR | Status: DC | PRN

## 2014-03-25 MED ORDER — OXYCODONE-ACETAMINOPHEN 5-325 MG PO TABS
ORAL_TABLET | ORAL | Status: AC
  Filled 2014-03-25: qty 1

## 2014-03-25 MED ORDER — SACCHAROMYCES BOULARDII 250 MG PO CAPS
250.0000 mg | ORAL_CAPSULE | Freq: Two times a day (BID) | ORAL | Status: DC
  Administered 2014-03-25 – 2014-03-28 (×4): 250 mg via ORAL
  Filled 2014-03-25 (×8): qty 1

## 2014-03-25 MED ORDER — HEPARIN SODIUM (PORCINE) 1000 UNIT/ML DIALYSIS: 1000.0000 [IU] | INTRAMUSCULAR | Status: DC | PRN

## 2014-03-25 MED ORDER — DARBEPOETIN ALFA 200 MCG/0.4ML IJ SOSY
PREFILLED_SYRINGE | INTRAMUSCULAR | Status: AC
  Filled 2014-03-25: qty 0.4

## 2014-03-25 NOTE — Procedures (Signed)
Tolerating hemodialysis without hemodynamic instability. Naliya Gish C  

## 2014-03-25 NOTE — Progress Notes (Signed)
ANTIBIOTIC CONSULT NOTE - FOLLOW UP  Pharmacy Consult for vancomycin/zosyn Indication:  gangrene left foot  No Known Allergies  Patient Measurements: Height: 5\' 3"  (160 cm) Weight: 85 lb 15.7 oz (39 kg) IBW/kg (Calculated) : 52.4   Vital Signs: Temp: 98.4 F (36.9 C) (02/02 1310) Temp Source: Oral (02/02 1310) BP: 93/53 mmHg (02/02 1500) Pulse Rate: 92 (02/02 1500) Intake/Output from previous day: 02/01 0701 - 02/02 0700 In: 240 [P.O.:240] Out: 100 [Urine:100] Intake/Output from this shift: Total I/O In: 360 [P.O.:360] Out: -   Labs:  Recent Labs  03/23/14 0315 03/24/14 0550 03/25/14 1300  WBC 8.6 8.9 10.4  HGB 9.3* 10.5* 9.6*  PLT 203 208 227  CREATININE 4.68* 3.19* 4.80*   Estimated Creatinine Clearance: 6.7 mL/min (by C-G formula based on Cr of 4.8).  Recent Labs  03/25/14 1245  VANCORANDOM 41.8    Assessment: 69 YOF with ESRD - HD TTS, on vancomycin/zosyn D#9 for dry gangrene left foot; wbc 10.4, afeb, plan for L 2nd toe amputation and popliteal endarterectomy tomorrow per Vascular Surgery. Pre-HD vancomycin level 41.8 above goal, but RN gave vanc before HD, and trough was obtained right after vancomycin infusion is completed. Estimate vanc level will decrease to ~ 25 after 4 hr session with BFR = 400 ml/min.  Vanc 1/25 >> Zosyn 1/25 >>  1/25 BCx2: ngtd 1/29 MRSA PCR: neg 1/29 Cdiff: neg  Goal of Therapy:  Target pre-HD level 15 - 25 mcg/mL Target post-HD level 5 - 15 mcg/mL  Plan:  - Instructed HD nurse not to give more vancomycin after HD.  - Continue vancomycin 500 mg IV Q HD, next dose to be given with next dialysis.  - Continue zosyn 2.25g IV Q 8 hrs  Bayard Hugger, PharmD, BCPS  Clinical Pharmacist  Pager: [phone]   03/25/2014,3:05 PM

## 2014-03-25 NOTE — Progress Notes (Signed)
Patient Name: Elizabeth [name] Date of Encounter: 03/25/2014     Principal Problem:   Cellulitis of foot Active Problems:   HLD (hyperlipidemia)   Anemia of chronic disease   End stage renal disease   Protein-calorie malnutrition, severe   Neuropathy   Ulcer of toe of left foot   Encounter for preoperative examination for general surgical procedure, vascular   Preop cardiovascular exam   DM type 2 (diabetes mellitus, type 2)   Black toe   Arterial hypotension   Cardiomyopathy, ischemic   Abnormal nuclear stress test   Systolic dysfunction, left ventricle    SUBJECTIVE  Denies any CP or SOB.   CURRENT MEDS . aspirin EC  81 mg Oral Daily  . darbepoetin (ARANESP) injection - DIALYSIS  200 mcg Intravenous Q Tue-HD  . feeding supplement (RESOURCE BREEZE)  1 Container Oral TID WC  . heparin  5,000 Units Subcutaneous 3 times per day  . insulin aspart  0-15 Units Subcutaneous TID WC  . insulin aspart  0-5 Units Subcutaneous QHS  . metoprolol succinate  12.5 mg Oral Daily  . piperacillin-tazobactam (ZOSYN)  IV  2.25 g Intravenous 3 times per day  . pravastatin  20 mg Oral q1800  . saccharomyces boulardii  250 mg Oral BID  . sevelamer carbonate  800 mg Oral TID WC  . vancomycin  500 mg Intravenous Q T,Th,Sa-HD    OBJECTIVE  Filed Vitals:   03/24/14 2000 03/24/14 2216 03/25/14 0508 03/25/14 1004  BP: 95/51 107/56 113/64 107/67  Pulse: 84 84 91 85  Temp:  98.7 F (37.1 C) 98 F (36.7 C)   TempSrc:  Oral Oral   Resp:  16 17   Height:      Weight:   81 lb 12.7 oz (37.1 kg)   SpO2: 94% 98% 90%     Intake/Output Summary (Last 24 hours) at 03/25/14 1031 Last data filed at 03/24/14 2226  Gross per 24 hour  Intake    240 ml  Output    100 ml  Net    140 ml   Filed Weights   03/23/14 1512 03/24/14 0535 03/25/14 0508  Weight: 79 lb 12.9 oz (36.2 kg) 82 lb 0.2 oz (37.2 kg) 81 lb 12.7 oz (37.1 kg)    PHYSICAL EXAM  General: Pleasant, NAD. Neuro: Alert and oriented  X 3. Moves all extremities spontaneously. Psych: Normal affect. HEENT:  Normal  Neck: Supple without bruits or JVD. Lungs:  Resp regular and unlabored. Bibasilar rale Heart: RRR no s3, s4, or murmurs. Abdomen: Soft, non-tender, non-distended, BS + x 4.  Extremities: No clubbing. L big toe and 2nd gangrene, LE cold  Accessory Clinical Findings  CBC  Recent Labs  03/23/14 0315 03/24/14 0550  WBC 8.6 8.9  HGB 9.3* 10.5*  HCT 30.1* 34.2*  MCV 82.7 84.9  PLT 203 208   Basic Metabolic Panel  Recent Labs  03/23/14 0315 03/24/14 0550  NA 131* 134*  K 4.2 3.6  CL 94* 98  CO2 27 27  GLUCOSE 157* 167*  BUN 34* 18  CREATININE 4.68* 3.19*  CALCIUM 8.0* 8.0*    TELE NSR with HR 80s    ECG  No new EKG  Echocardiogram 03/10/2014  - Left ventricle: The cavity size was normal. Wall thickness was increased in a pattern of mild LVH. Systolic function was moderately to severely reduced. The estimated ejection fraction was in the range of 30% to 35%. Severe hypokinesis of the inferior myocardium. -  Mitral valve: There was mild regurgitation. - Left atrium: The atrium was mildly dilated. - Pulmonary arteries: PA peak pressure: 31 mm Hg (S). - Pericardium, extracardiac: A trivial pericardial effusion was identified. There was a left pleural effusion.     Radiology/Studies  Mr Foot Left Wo Contrast  03/18/2014   CLINICAL DATA:  Cellulitis of the left foot.  EXAM: MRI OF THE LEFT FOREFOOT WITHOUT CONTRAST  TECHNIQUE: Multiplanar, multisequence MR imaging was performed. No intravenous contrast was administered.  COMPARISON:  Radiographs dated 03/17/2014  FINDINGS: There is diffuse abnormal signal from the proximal phalanx of the second toe. The middle phalanx is exposed and devitalized with very little signal. The distal phalanx is missing. There is a large soft tissue defect at the stump of the second toe.  There is abnormal subcutaneous edema at the base of the second  and third toes particularly along the plantar aspect and extending in the web space between the first and second toes. No discrete abscess. There is no osteomyelitis of the other toes.  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  IMPRESSION: Devitalization of the middle phalanx of the second toe. Abnormal signal from the proximal phalanx of the second toe is most likely due being devitalized. There is no discrete destruction of the proximal phalanx.  Adjacent cellulitis. The distal phalanx of the second toe is missing. Middle phalanx is devitalized.   Electronically Signed   By: Geanie Cooley M.D.   On: 03/18/2014 07:49   Nm Myocar Multi W/spect W/wall Motion / Ef  03/21/2014   CLINICAL DATA:  Chest pain.  Shortness of Breath.  Diabetes.  EXAM: MYOCARDIAL IMAGING WITH SPECT (REST AND PHARMACOLOGIC-STRESS)  GATED LEFT VENTRICULAR WALL MOTION STUDY  LEFT VENTRICULAR EJECTION FRACTION  TECHNIQUE: Standard myocardial SPECT imaging was performed after resting intravenous injection of 10 mCi Tc-73m sestamibi. Subsequently, intravenous infusion of Lexiscan was performed under the supervision of the Cardiology staff. At peak effect of the drug, 30 mCi Tc-52m sestamibi was injected intravenously and standard myocardial SPECT imaging was performed. Quantitative gated imaging was also performed to evaluate left ventricular wall motion, and estimate left ventricular ejection fraction.  COMPARISON:  None.  FINDINGS: Perfusion: Decreased uptake in the inferior wall on both sets of imaging suggesting inferior wall scar. Associated wall motion abnormality. No reversible defects to suggest ischemia.  Wall Motion: Mild global hypokinesis and inferior wall motion abnormality.  Left Ventricular Ejection Fraction: 35 %  End diastolic volume 116 ml  End systolic volume 75 ml  IMPRESSION: 1. Inferior wall scar.  No ischemia.  2. Mild global hypokinesis and inferior wall motion abnormality.  3. Left  ventricular ejection fraction 35%  4. Intermediate-risk stress test findings*.  *2012 Appropriate Use Criteria for Coronary Revascularization Focused Update: J Am Coll Cardiol. 2012;59(9):857-881. http://content.dementiazones.com.aspx?articleid=1201161   Electronically Signed   By: Loralie Champagne M.D.   On: 03/21/2014 15:34   Dg Foot Complete Left  03/17/2014   CLINICAL DATA:  Infection of second toe of left foot.  EXAM: LEFT FOOT - COMPLETE 3+ VIEW  COMPARISON:  None.  FINDINGS: Advanced vascular calcifications. Mild hallux valgus deformity with osteoarthritis of the first metatarsal phalangeal joint. Soft tissue defect about the distal second digit. No gross osseous destruction identified. Extensive overlap of digits on the lateral view. No soft tissue gas.  IMPRESSION: Suspect soft tissue ulcer about the second digit, without gross osseous destruction. Dependent on clinical concern, consider contrast-enhanced MRI.   Electronically Signed  By: Jeronimo Greaves M.D.   On: 03/17/2014 18:52    ASSESSMENT AND PLAN  1. Left toe gangrene with PVD  - per primary team and vascular, plans for surgery (tibial endarterectomy and amputation)   2. ESRD  - HD per renal TTS  3, Systolic heart failure/newly recognized nonischemic cardiomyopathy (see cath report below) in the context of pre-operative evaluation  - echo this admit LVEF 30-35%, severe inferior hypokinesis. No old echoes to compare, nuclear study from 03/2012 showed LVEF 44% and evidence of inferior scar with mild peri-infarct ischemia. Coronaries were not studied at that time due to CKD, she has since started HD.   - BP tolerating low dose BB (started yesterday)  - L&RHC 03/24/2014 50-60% mid LAD, diffuse 30-40% prox LAD, 40% ost D1 stenosis, 50-60% ostial OM1 stenosis. CI 2.66. CO 3.51. No obvious culprit lesion to explain inferior wall motion abnormalities and severely reduced EF.    - can proceed with PV surgery (likely tomorrow) as planned  without further cardiac workup. Need to be care with fluid perioperatively as she is at higher risk of fluid overload with her NICM with EF 30-35%. Fluid management via HD by nephrology. Continue Toprol XL.   4. HTN 5. Anemia 6. Dyslipidemia 7. 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 Severe protein calorie malnutrition 9. Diabetes mellitus with the above complications  Signed, Amedeo Plenty Pager: [phone]  I have examined the patient and reviewed assessment and plan and discussed with patient.  Agree with above as stated.  Moderate disease by cath. No PCI. No further testing before surgery. COnsider PLavix post op due to CAD and PAD.  Deondrea Aguado S.

## 2014-03-25 NOTE — Progress Notes (Signed)
Problem/Plan:  1. L foot ischemia / early gangrene - for surgery by [facility] in AM 2. Systolic heart failure - inf scar, no old echos, EF 30-35%. Cardiology cleared 3. ESRD - TTS HD, next HD today 4. Hypertension/volume - BP's OK , holding coreg  Subjective: Interval History: Cath yesterday.  Objective: Vital signs in last 24 hours: Temp:  [97.9 F (36.6 C)-98.7 F (37.1 C)] 98 F (36.7 C) (02/02 0508) Pulse Rate:  [78-91] 85 (02/02 1004) Resp:  [9-18] 17 (02/02 0508) BP: (95-139)/(42-81) 107/67 mmHg (02/02 1004) SpO2:  [90 %-100 %] 90 % (02/02 0508) Weight:  [37.1 kg (81 lb 12.7 oz)] 37.1 kg (81 lb 12.7 oz) (02/02 0508) Weight change: -1.6 kg (-3 lb 8.4 oz)  Intake/Output from previous day: 02/01 0701 - 02/02 0700 In: 240 [P.O.:240] Out: 100 [Urine:100] Intake/Output this shift: Total I/O In: 240 [P.O.:240] Out: -   General appearance: alert and cooperative Resp: clear to auscultation bilaterally Cardio: regular rate and rhythm, S1, S2 normal, no murmur, click, rub or gallop Extremities: edema none  Left foot with discoloration Has foley  Lab Results:  Recent Labs  03/23/14 0315 03/24/14 0550  WBC 8.6 8.9  HGB 9.3* 10.5*  HCT 30.1* 34.2*  PLT 203 208   BMET:  Recent Labs  03/23/14 0315 03/24/14 0550  NA 131* 134*  K 4.2 3.6  CL 94* 98  CO2 27 27  GLUCOSE 157* 167*  BUN 34* 18  CREATININE 4.68* 3.19*  CALCIUM 8.0* 8.0*   No results for input(s): PTH in the last 72 hours. Iron Studies: No results for input(s): IRON, TIBC, TRANSFERRIN, FERRITIN in the last 72 hours. Studies/Results: No results found.  Scheduled: . aspirin EC  81 mg Oral Daily  . darbepoetin (ARANESP) injection - DIALYSIS  200 mcg Intravenous Q Tue-HD  . feeding supplement (RESOURCE BREEZE)  1 Container Oral TID WC  . heparin  5,000 Units Subcutaneous 3 times per day  . insulin aspart  0-15 Units Subcutaneous TID WC  . insulin aspart  0-5 Units  Subcutaneous QHS  . metoprolol succinate  12.5 mg Oral Daily  . piperacillin-tazobactam (ZOSYN)  IV  2.25 g Intravenous 3 times per day  . pravastatin  20 mg Oral q1800  . saccharomyces boulardii  250 mg Oral BID  . sevelamer carbonate  800 mg Oral TID WC  . vancomycin  500 mg Intravenous Q T,Th,Sa-HD     LOS: 8 days   Jessieca Rhem C 03/25/2014,10:59 AM

## 2014-03-25 NOTE — Progress Notes (Signed)
[facility] PROGRESS NOTE   Elizabeth [name] ZOX:[id number] DOB: 1946-01-13 DOA: 03/17/2014 PCP: [name], MD  HPI: The patient presented with complaints of infection ofLeft foot toe. She was found to have gangrenous changes. She was subsequently admitted to the hospital. Vascular surgery was consulted. She underwent arteriogram. Plan is for surgical intervention if cleared by cardiology. She underwent stress test on 1/29 , along with echocardiogram. EF was noted to be low in the 30s. Subsequently, she underwent cardiac catheterization. Nonobstructive CAD was noted.  Subjective: Patient continues to feel the same. Denies any complaints.   Assessment/Plan: Principal Problem:   Cellulitis of foot Active Problems:   HLD (hyperlipidemia)   Anemia of chronic disease   End stage renal disease   Protein-calorie malnutrition, severe   Neuropathy   Ulcer of toe of left foot   Encounter for preoperative examination for general surgical procedure, vascular   Preop cardiovascular exam   DM type 2 (diabetes mellitus, type 2)   Black toe   Arterial hypotension   Cardiomyopathy, ischemic   Abnormal nuclear stress test   Systolic dysfunction, left ventricle   Cellulitis of Left foot/Ulcer of Toe of Left Foot -Due to peripheral vascular disease and diabetes. -MRI w/o contrast of left foot showed "Devitalization of the middle phalanx of the second toe. Abnormal signal from the proximal phalanx of the second toe is most likely due being devitalized. There is no discrete destruction of the proximal phalanx. Adjacent cellulitis. The distal phalanx of the second toe is missing." -Bilateral lower extremity venous duplex showed no evidence of DVT, superficial thrombosis, or Baker's cyst. -Continue vancomycin and zosyn for now. Can likely be discontinued or de-escalated after surgery. Blood cultures negative so far. -Patient underwent arteriogram 1/27. Vascular surgery is planning popliteal  tibial endarterectomy and toe amputation. Plan is for surgical intervention 2/3. Cardiac workup is completed and she may proceed to surgery. She was started on low-dose beta blocker.   Chronic systolic dysfunction  Seen on stress test and echocardiogram. Cardiology is following. She is well compensated clinically possibly because of dialysis.  Diarrhea Appears to have resolved. C. difficile is negative. Can give Imodium if she has recurrence. Probiotics  DM Type 2, uncontrolled Continue NovoLOG on sliding scale. CBGs are reasonably well controlled. Continue to monitor  End stage renal disease TTS/Hyponatremia -Renal failure likely due to diabetes -Management per Nephrology.    Hyperlipidemia -Continue pravastatin.   History of essential Hypertension with episodes of hypotension Blood pressure remains stable.    Anemia of Chronic disease -Hgb remains stable.  Severe protein calorie malnutrition Continue with protein supplements.  DVT Prophylaxis: Heparin Code Status: Full Code Family Communication: Plan discussed with the patient. Disposition Plan: Not ready for discharge  Consultants:  Vascular surgery  Nephrology  Cardiology  Procedures: Arteriogram 03/19/2014 Operative findings: #1 occlusion anterior tibial and posterior tibial arteries bilaterally   #2 90% stenosis origin left peroneal artery   #3 one vessel runoff right peroneal with in-line flow  Nuclear stress test 1/29 IMPRESSION: 1. Inferior wall scar. No ischemia. 2. Mild global hypokinesis and inferior wall motion abnormality. 3. Left ventricular ejection fraction 35% 4. Intermediate-risk stress test findings*.  2D ECHO Study Conclusions - Left ventricle: The cavity size was normal. Wall thickness wasincreased in a pattern of mild LVH. Systolic function wasmoderately to severely reduced. The estimated ejection fractionwas in the range  of 30% to 35%. Severe hypokinesis of theinferior myocardium. - Mitral valve: There was mild regurgitation. - Left atrium: The atrium  was mildly dilated. - Pulmonary arteries: PA peak pressure: 31 mm Hg (S). - Pericardium, extracardiac: A trivial pericardial effusion wasidentified. There was a left pleural effusion.  Cardiac catheterization 2/1  Antibiotics:  Vancomycin 1/25>>  Zosyn 1/25>>   Objective: Filed Vitals:   03/25/14 0508  BP: 113/64  Pulse: 91  Temp: 98 F (36.7 C)  Resp: 17    Intake/Output Summary (Last 24 hours) at 03/25/14 0755 Last data filed at 03/24/14 2226  Gross per 24 hour  Intake    240 ml  Output    100 ml  Net    140 ml   Filed Weights   03/23/14 1512 03/24/14 0535 03/25/14 0508  Weight: 36.2 kg (79 lb 12.9 oz) 37.2 kg (82 lb 0.2 oz) 37.1 kg (81 lb 12.7 oz)    Exam: General: Alert and awake, oriented x3, not in any acute distress, CVS: S1-S2 normal, regular. No rubs, or bruit. Chest: clear to auscultation bilaterally, no wheezing, rales or rhonchi;  Abdomen: soft, nontender, nondistended, quiet bowel sounds, scar in hypogastric region (pt says from suprapubic catheter) Extremities: Left foot second toe is black and necrotic; left foot is tender to light palpation. Pedal pulses difficult to palpate. No focal deficits  Data Reviewed: Basic Metabolic Panel:  Recent Labs Lab 03/19/14 0629 03/20/14 0840 03/23/14 0315 03/24/14 0550  NA 131* 132* 131* 134*  K 3.8 4.0 4.2 3.6  CL 96 95* 94* 98  CO2 GLUCOSE 144* 115* 157* 167*  BUN 25* 36* 34* 18  CREATININE 2.65* 4.08* 4.68* 3.19*  CALCIUM 7.9* 7.7* 8.0* 8.0*  PHOS  --  5.2*  --   --    Liver Function Tests:  Recent Labs Lab 03/20/14 0840  ALBUMIN 1.8*   CBC:  Recent Labs Lab 03/20/14 0840 03/21/14 1520 03/22/14 0350 03/23/14 0315 03/24/14 0550  WBC 11.6* 10.4 9.0 8.6 8.9  HGB 9.7* 9.4* 8.8* 9.3* 10.5*  HCT 30.0* 30.2* 28.3* 30.1* 34.2*  MCV 82.0 81.6  81.3 82.7 84.9  PLT 175 176 183 203 208   CBG:  Recent Labs Lab 03/24/14 1126 03/24/14 1528 03/24/14 1710 03/24/14 2214 03/25/14 0700  GLUCAP 131* 90 93 171* 123*    Micro Recent Results (from the past 240 hour(s))  Culture, blood (single)     Status: None   Collection Time: 03/17/14  5:31 PM  Result Value Ref Range Status   Specimen Description BLOOD ARM RIGHT  Final   Special Requests BOTTLES DRAWN AEROBIC AND ANAEROBIC 5CC  Final   Culture   Final    NO GROWTH 5 DAYS Performed at [facility]    Report Status 03/24/2014 FINAL  Final  MRSA PCR Screening     Status: None   Collection Time: 03/21/14  7:23 AM  Result Value Ref Range Status   MRSA by PCR NEGATIVE NEGATIVE Final    Comment:        The GeneXpert MRSA Assay (FDA approved for NASAL specimens only), is one component of a comprehensive MRSA colonization surveillance program. It is not intended to diagnose MRSA infection nor to guide or monitor treatment for MRSA infections.   Clostridium Difficile by PCR     Status: None   Collection Time: 03/21/14  8:43 PM  Result Value Ref Range Status   C difficile by pcr NEGATIVE NEGATIVE Final     Studies: No results found.  Scheduled Meds: . aspirin EC  81 mg Oral Daily  .  darbepoetin (ARANESP) injection - DIALYSIS  200 mcg Intravenous Q Tue-HD  . feeding supplement (RESOURCE BREEZE)  1 Container Oral TID WC  . heparin  3,700 Units Dialysis Once in dialysis  . heparin  5,000 Units Subcutaneous 3 times per day  . insulin aspart  0-15 Units Subcutaneous TID WC  . insulin aspart  0-5 Units Subcutaneous QHS  . metoprolol succinate  12.5 mg Oral Daily  . piperacillin-tazobactam (ZOSYN)  IV  2.25 g Intravenous 3 times per day  . pravastatin  20 mg Oral q1800  . saccharomyces boulardii  250 mg Oral BID  . sevelamer carbonate  800 mg Oral TID WC  . vancomycin  500 mg Intravenous Q T,Th,Sa-HD   Continuous Infusions:     Time spent: 20  minutes   [facility] [place] [facility] Pager [phone]    If 7PM-7AM, please contact night-coverage at [email or web address], password [facility] 03/25/2014, 7:55 AM  LOS: 8 days

## 2014-03-25 NOTE — Consult Note (Signed)
Operative plan for tomorrow discussed with pt.  Risks benefits complications.  Bleeding infection possible myocardial events limb loss.  PE:  Filed Vitals:   03/24/14 2000 03/24/14 2216 03/25/14 0508 03/25/14 1004  BP: 95/51 107/56 113/64 107/67  Pulse: 84 84 91 85  Temp:  98.7 F (37.1 C) 98 F (36.7 C)   TempSrc:  Oral Oral   Resp:  16 17   Height:      Weight:   81 lb 12.7 oz (37.1 kg)   SpO2: 94% 98% 90%     Dry gangrene left second toe unchanged.  Left 2nd toe amp and popliteal endarterectomy tomorrow  Fabienne Bruns, MD [facility] [place] Office: [phone] Pager: [phone]

## 2014-03-26 ENCOUNTER — Inpatient Hospital Stay (HOSPITAL_COMMUNITY): Payer: Medicare Other | Admitting: Certified Registered Nurse Anesthetist

## 2014-03-26 ENCOUNTER — Encounter (HOSPITAL_COMMUNITY): Payer: Self-pay | Admitting: Certified Registered Nurse Anesthetist

## 2014-03-26 ENCOUNTER — Encounter (HOSPITAL_COMMUNITY)
Admission: EM | Disposition: A | Discharge status: Skilled Nursing Facility | Payer: Self-pay | Source: Home / Self Care | Attending: Internal Medicine

## 2014-03-26 ENCOUNTER — Encounter: Payer: Self-pay | Admitting: Vascular Surgery

## 2014-03-26 DIAGNOSIS — I70262 Atherosclerosis of native arteries of extremities with gangrene, left leg: Secondary | ICD-10-CM

## 2014-03-26 HISTORY — PX: AMPUTATION: SHX166

## 2014-03-26 HISTORY — PX: ENDARTERECTOMY POPLITEAL: SHX5806

## 2014-03-26 LAB — GLUCOSE, CAPILLARY
GLUCOSE-CAPILLARY: 126 mg/dL — AB (ref 70–99)
GLUCOSE-CAPILLARY: 146 mg/dL — AB (ref 70–99)
Glucose-Capillary: 155 mg/dL — ABNORMAL HIGH (ref 70–99)
Glucose-Capillary: 138 mg/dL — ABNORMAL HIGH (ref 70–99)
Glucose-Capillary: 151 mg/dL — ABNORMAL HIGH (ref 70–99)
Glucose-Capillary: 110 mg/dL — ABNORMAL HIGH (ref 70–99)

## 2014-03-26 LAB — RENAL FUNCTION PANEL
ALBUMIN: 1.5 g/dL — AB (ref 3.5–5.2)
Anion gap: 13 (ref 5–15)
BUN: 14 mg/dL (ref 6–23)
CHLORIDE: 100 mmol/L (ref 96–112)
CO2: 22 mmol/L (ref 19–32)
CREATININE: 3.24 mg/dL — AB (ref 0.50–1.10)
Calcium: 7.7 mg/dL — ABNORMAL LOW (ref 8.4–10.5)
GFR calc Af Amer: 16 mL/min — ABNORMAL LOW (ref >90)
GFR, EST NON AFRICAN AMERICAN: 13 mL/min — AB (ref >90)
Glucose, Bld: 158 mg/dL — ABNORMAL HIGH (ref 70–99)
PHOSPHORUS: 4 mg/dL (ref 2.3–4.6)
Potassium: 3.9 mmol/L (ref 3.5–5.1)
SODIUM: 135 mmol/L (ref 135–145)

## 2014-03-26 LAB — CBC
HCT: 32 % — ABNORMAL LOW (ref 36.0–46.0)
HEMOGLOBIN: 9.9 g/dL — AB (ref 12.0–15.0)
MCH: 25.6 pg — ABNORMAL LOW (ref 26.0–34.0)
MCHC: 30.9 g/dL (ref 30.0–36.0)
MCV: 82.7 fL (ref 78.0–100.0)
Platelets: 247 10*3/uL (ref 150–400)
RBC: 3.87 MIL/uL (ref 3.87–5.11)
RDW: 18.4 % — AB (ref 11.5–15.5)
WBC: 9.8 10*3/uL (ref 4.0–10.5)

## 2014-03-26 LAB — BASIC METABOLIC PANEL
Anion gap: 8 (ref 5–15)
BUN: 11 mg/dL (ref 6–23)
CO2: 26 mmol/L (ref 19–32)
Calcium: 7.7 mg/dL — ABNORMAL LOW (ref 8.4–10.5)
Chloride: 100 mmol/L (ref 96–112)
Creatinine, Ser: 2.65 mg/dL — ABNORMAL HIGH (ref 0.50–1.10)
GFR calc Af Amer: 20 mL/min — ABNORMAL LOW (ref >90)
GFR, EST NON AFRICAN AMERICAN: 17 mL/min — AB (ref >90)
Glucose, Bld: 148 mg/dL — ABNORMAL HIGH (ref 70–99)
POTASSIUM: 3.7 mmol/L (ref 3.5–5.1)
SODIUM: 134 mmol/L — AB (ref 135–145)

## 2014-03-26 SURGERY — ENDARTERECTOMY, POPLITEAL
Anesthesia: General | Site: Leg Lower | Laterality: Left

## 2014-03-26 MED ORDER — 0.9 % SODIUM CHLORIDE (POUR BTL) OPTIME
TOPICAL | Status: DC | PRN
  Administered 2014-03-26: 2000 mL

## 2014-03-26 MED ORDER — FENTANYL CITRATE 0.05 MG/ML IJ SOLN
INTRAMUSCULAR | Status: AC
  Filled 2014-03-26: qty 5

## 2014-03-26 MED ORDER — PROPOFOL 10 MG/ML IV BOLUS
INTRAVENOUS | Status: AC
  Filled 2014-03-26: qty 20

## 2014-03-26 MED ORDER — MIDAZOLAM HCL 2 MG/2ML IJ SOLN
INTRAMUSCULAR | Status: AC
  Filled 2014-03-26: qty 2

## 2014-03-26 MED ORDER — LABETALOL HCL 5 MG/ML IV SOLN
10.0000 mg | INTRAVENOUS | Status: DC | PRN
  Filled 2014-03-26: qty 4

## 2014-03-26 MED ORDER — ARTIFICIAL TEARS OP OINT
TOPICAL_OINTMENT | OPHTHALMIC | Status: AC
Start: 2014-03-26 — End: 2014-03-26
  Filled 2014-03-26: qty 3.5

## 2014-03-26 MED ORDER — MORPHINE SULFATE 2 MG/ML IJ SOLN: 2.0000 mg | INTRAMUSCULAR | Status: DC | PRN

## 2014-03-26 MED ORDER — PROPOFOL 10 MG/ML IV BOLUS
INTRAVENOUS | Status: DC | PRN
  Administered 2014-03-26: 60 mg via INTRAVENOUS

## 2014-03-26 MED ORDER — PHENOL 1.4 % MT LIQD
1.0000 | OROMUCOSAL | Status: DC | PRN
Start: 2014-03-26 — End: 2014-03-28

## 2014-03-26 MED ORDER — PROTAMINE SULFATE 10 MG/ML IV SOLN
INTRAVENOUS | Status: AC
  Filled 2014-03-26: qty 15

## 2014-03-26 MED ORDER — GLYCOPYRROLATE 0.2 MG/ML IJ SOLN
INTRAMUSCULAR | Status: DC | PRN
  Administered 2014-03-26: 0.4 mg via INTRAVENOUS

## 2014-03-26 MED ORDER — METOPROLOL TARTRATE 1 MG/ML IV SOLN: 2.0000 mg | INTRAVENOUS | Status: DC | PRN

## 2014-03-26 MED ORDER — NEOSTIGMINE METHYLSULFATE 10 MG/10ML IV SOLN
INTRAVENOUS | Status: DC | PRN
Start: 2014-03-26 — End: 2014-03-26
  Administered 2014-03-26: 3 mg via INTRAVENOUS

## 2014-03-26 MED ORDER — DEXTROSE 5 % IV SOLN
10.0000 mg | INTRAVENOUS | Status: DC | PRN
  Administered 2014-03-26: 20 ug/min via INTRAVENOUS

## 2014-03-26 MED ORDER — MEPERIDINE HCL 25 MG/ML IJ SOLN: 6.2500 mg | INTRAMUSCULAR | Status: DC | PRN

## 2014-03-26 MED ORDER — LIDOCAINE HCL (CARDIAC) 20 MG/ML IV SOLN
INTRAVENOUS | Status: DC | PRN
  Administered 2014-03-26: 50 mg via INTRAVENOUS

## 2014-03-26 MED ORDER — SODIUM CHLORIDE 0.9 % IV SOLN: 500.0000 mL | Freq: Once | INTRAVENOUS | Status: AC | PRN

## 2014-03-26 MED ORDER — HYDROMORPHONE HCL 1 MG/ML IJ SOLN
0.2500 mg | INTRAMUSCULAR | Status: DC | PRN
  Administered 2014-03-26 (×3): 0.25 mg via INTRAVENOUS

## 2014-03-26 MED ORDER — HYDRALAZINE HCL 20 MG/ML IJ SOLN: 5.0000 mg | INTRAMUSCULAR | Status: DC | PRN

## 2014-03-26 MED ORDER — HYDROMORPHONE HCL 1 MG/ML IJ SOLN
INTRAMUSCULAR | Status: AC
  Filled 2014-03-26: qty 1

## 2014-03-26 MED ORDER — LIDOCAINE HCL (CARDIAC) 20 MG/ML IV SOLN
INTRAVENOUS | Status: AC
Start: 2014-03-26 — End: 2014-03-26
  Filled 2014-03-26: qty 5

## 2014-03-26 MED ORDER — METOPROLOL SUCCINATE 12.5 MG HALF TABLET
12.5000 mg | ORAL_TABLET | Freq: Every day | ORAL | Status: DC
  Filled 2014-03-26: qty 1

## 2014-03-26 MED ORDER — THROMBIN 20000 UNITS EX SOLR
OROMUCOSAL | Status: DC | PRN
  Administered 2014-03-26: 20 mL via TOPICAL

## 2014-03-26 MED ORDER — ALBUMIN HUMAN 5 % IV SOLN
12.5000 g | Freq: Once | INTRAVENOUS | Status: AC
  Administered 2014-03-26: 12.5 g via INTRAVENOUS

## 2014-03-26 MED ORDER — ONDANSETRON HCL 4 MG/2ML IJ SOLN
INTRAMUSCULAR | Status: AC
Start: 2014-03-26 — End: 2014-03-26
  Filled 2014-03-26: qty 2

## 2014-03-26 MED ORDER — ALBUMIN HUMAN 5 % IV SOLN
INTRAVENOUS | Status: AC
  Administered 2014-03-26: 12.5 g
  Filled 2014-03-26: qty 250

## 2014-03-26 MED ORDER — ROCURONIUM BROMIDE 50 MG/5ML IV SOLN
INTRAVENOUS | Status: AC
Start: 2014-03-26 — End: 2014-03-26
  Filled 2014-03-26: qty 1

## 2014-03-26 MED ORDER — HEPARIN SODIUM (PORCINE) 1000 UNIT/ML IJ SOLN
INTRAMUSCULAR | Status: DC | PRN
  Administered 2014-03-26: 5000 [IU] via INTRAVENOUS

## 2014-03-26 MED ORDER — ONDANSETRON HCL 4 MG/2ML IJ SOLN
INTRAMUSCULAR | Status: DC | PRN
  Administered 2014-03-26: 4 mg via INTRAVENOUS

## 2014-03-26 MED ORDER — PANTOPRAZOLE SODIUM 40 MG PO TBEC
40.0000 mg | DELAYED_RELEASE_TABLET | Freq: Every day | ORAL | Status: DC
  Administered 2014-03-28: 40 mg via ORAL
  Filled 2014-03-26: qty 1

## 2014-03-26 MED ORDER — ONDANSETRON HCL 4 MG/2ML IJ SOLN: 4.0000 mg | Freq: Once | INTRAMUSCULAR | Status: DC | PRN

## 2014-03-26 MED ORDER — HEPARIN SODIUM (PORCINE) 5000 UNIT/ML IJ SOLN
5000.0000 [IU] | Freq: Three times a day (TID) | INTRAMUSCULAR | Status: DC
  Administered 2014-03-27 – 2014-03-28 (×3): 5000 [IU] via SUBCUTANEOUS
  Filled 2014-03-26 (×8): qty 1

## 2014-03-26 MED ORDER — ROCURONIUM BROMIDE 100 MG/10ML IV SOLN
INTRAVENOUS | Status: DC | PRN
  Administered 2014-03-26: 30 mg via INTRAVENOUS

## 2014-03-26 MED ORDER — FENTANYL CITRATE 0.05 MG/ML IJ SOLN
INTRAMUSCULAR | Status: DC | PRN
  Administered 2014-03-26: 100 ug via INTRAVENOUS
  Administered 2014-03-26: 50 ug via INTRAVENOUS

## 2014-03-26 MED ORDER — THROMBIN 20000 UNITS EX SOLR
CUTANEOUS | Status: AC
  Filled 2014-03-26: qty 20000

## 2014-03-26 MED ORDER — GUAIFENESIN-DM 100-10 MG/5ML PO SYRP: 15.0000 mL | ORAL_SOLUTION | ORAL | Status: DC | PRN

## 2014-03-26 MED ORDER — HEPARIN SODIUM (PORCINE) 5000 UNIT/ML IJ SOLN
INTRAMUSCULAR | Status: DC | PRN
  Administered 2014-03-26: 13:00:00

## 2014-03-26 MED ORDER — NEOSTIGMINE METHYLSULFATE 10 MG/10ML IV SOLN
INTRAVENOUS | Status: AC
  Filled 2014-03-26: qty 1

## 2014-03-26 MED ORDER — GLYCOPYRROLATE 0.2 MG/ML IJ SOLN
INTRAMUSCULAR | Status: AC
Start: 2014-03-26 — End: 2014-03-26
  Filled 2014-03-26: qty 3

## 2014-03-26 MED ORDER — ALBUMIN HUMAN 5 % IV SOLN
INTRAVENOUS | Status: AC
  Filled 2014-03-26: qty 250

## 2014-03-26 MED ORDER — SODIUM CHLORIDE 0.9 % IV SOLN
INTRAVENOUS | Status: DC
  Administered 2014-03-26 (×2): via INTRAVENOUS

## 2014-03-26 MED ORDER — PROTAMINE SULFATE 10 MG/ML IV SOLN
INTRAVENOUS | Status: DC | PRN
  Administered 2014-03-26: 50 mg via INTRAVENOUS

## 2014-03-26 MED ORDER — DOCUSATE SODIUM 100 MG PO CAPS
100.0000 mg | ORAL_CAPSULE | Freq: Every day | ORAL | Status: DC
  Administered 2014-03-28: 100 mg via ORAL
  Filled 2014-03-26: qty 1

## 2014-03-26 MED ORDER — ALBUMIN HUMAN 5 % IV SOLN: 12.5000 g | Freq: Once | INTRAVENOUS | Status: DC

## 2014-03-26 SURGICAL SUPPLY — 59 items
BANDAGE ELASTIC 4 VELCRO ST LF (GAUZE/BANDAGES/DRESSINGS) ×3 IMPLANT
BANDAGE ESMARK 6X9 LF (GAUZE/BANDAGES/DRESSINGS) IMPLANT
BNDG ESMARK 6X9 LF (GAUZE/BANDAGES/DRESSINGS)
BNDG GAUZE ELAST 4 BULKY (GAUZE/BANDAGES/DRESSINGS) ×6 IMPLANT
CANISTER SUCTION 2500CC (MISCELLANEOUS) ×3 IMPLANT
CANNULA VESSEL 3MM 2 BLNT TIP (CANNULA) ×3 IMPLANT
CLIP TI MEDIUM 24 (CLIP) ×3 IMPLANT
CLIP TI WIDE RED SMALL 24 (CLIP) ×3 IMPLANT
COVER SURGICAL LIGHT HANDLE (MISCELLANEOUS) ×3 IMPLANT
CUFF TOURNIQUET SINGLE 24IN (TOURNIQUET CUFF) IMPLANT
CUFF TOURNIQUET SINGLE 34IN LL (TOURNIQUET CUFF) IMPLANT
CUFF TOURNIQUET SINGLE 44IN (TOURNIQUET CUFF) IMPLANT
DRAIN SNY WOU (WOUND CARE) IMPLANT
DRAPE EXTREMITY T 121X128X90 (DRAPE) ×3 IMPLANT
DRSG COVADERM 4X8 (GAUZE/BANDAGES/DRESSINGS) IMPLANT
DRSG EMULSION OIL 3X3 NADH (GAUZE/BANDAGES/DRESSINGS) ×3 IMPLANT
ELECT REM PT RETURN 9FT ADLT (ELECTROSURGICAL) ×3
ELECTRODE REM PT RTRN 9FT ADLT (ELECTROSURGICAL) ×2 IMPLANT
EVACUATOR SILICONE 100CC (DRAIN) IMPLANT
GAUZE SPONGE 4X4 12PLY STRL (GAUZE/BANDAGES/DRESSINGS) ×3 IMPLANT
GLOVE BIO SURGEON STRL SZ 6.5 (GLOVE) ×12 IMPLANT
GLOVE BIO SURGEON STRL SZ7 (GLOVE) ×6 IMPLANT
GLOVE BIO SURGEON STRL SZ7.5 (GLOVE) ×3 IMPLANT
GLOVE BIOGEL PI IND STRL 6.5 (GLOVE) ×4 IMPLANT
GLOVE BIOGEL PI IND STRL 7.0 (GLOVE) ×2 IMPLANT
GLOVE BIOGEL PI IND STRL 7.5 (GLOVE) ×2 IMPLANT
GLOVE BIOGEL PI INDICATOR 6.5 (GLOVE) ×2
GLOVE BIOGEL PI INDICATOR 7.0 (GLOVE) ×1
GLOVE BIOGEL PI INDICATOR 7.5 (GLOVE) ×1
GLOVE ECLIPSE 6.5 STRL STRAW (GLOVE) ×3 IMPLANT
GLOVE SURG SS PI 6.0 STRL IVOR (GLOVE) ×3 IMPLANT
GOWN STRL REUS W/ TWL LRG LVL3 (GOWN DISPOSABLE) ×6 IMPLANT
GOWN STRL REUS W/ TWL XL LVL3 (GOWN DISPOSABLE) ×6 IMPLANT
GOWN STRL REUS W/TWL LRG LVL3 (GOWN DISPOSABLE) ×3
GOWN STRL REUS W/TWL XL LVL3 (GOWN DISPOSABLE) ×3
KIT BASIN OR (CUSTOM PROCEDURE TRAY) ×3 IMPLANT
KIT ROOM TURNOVER OR (KITS) ×3 IMPLANT
LIQUID BAND (GAUZE/BANDAGES/DRESSINGS) ×3 IMPLANT
NS IRRIG 1000ML POUR BTL (IV SOLUTION) ×6 IMPLANT
PACK GENERAL/GYN (CUSTOM PROCEDURE TRAY) ×3 IMPLANT
PACK PERIPHERAL VASCULAR (CUSTOM PROCEDURE TRAY) ×3 IMPLANT
PAD ARMBOARD 7.5X6 YLW CONV (MISCELLANEOUS) ×6 IMPLANT
PADDING CAST COTTON 6X4 STRL (CAST SUPPLIES) IMPLANT
PROBE PENCIL 8 MHZ STRL DISP (MISCELLANEOUS) ×3 IMPLANT
SPECIMEN JAR SMALL (MISCELLANEOUS) ×3 IMPLANT
SPONGE SURGIFOAM ABS GEL 100 (HEMOSTASIS) IMPLANT
STAPLER VISISTAT 35W (STAPLE) IMPLANT
SUT ETHILON 3 0 PS 1 (SUTURE) ×3 IMPLANT
SUT PROLENE 5 0 C 1 24 (SUTURE) ×3 IMPLANT
SUT PROLENE 6 0 CC (SUTURE) ×3 IMPLANT
SUT PROLENE 7 0 BV 1 (SUTURE) ×12 IMPLANT
SUT VIC AB 2-0 CTX 36 (SUTURE) ×6 IMPLANT
SUT VIC AB 3-0 SH 27 (SUTURE) ×2
SUT VIC AB 3-0 SH 27X BRD (SUTURE) ×4 IMPLANT
SWAB COLLECTION DEVICE MRSA (MISCELLANEOUS) IMPLANT
TRAY FOLEY CATH 16FRSI W/METER (SET/KITS/TRAYS/PACK) IMPLANT
TUBE ANAEROBIC SPECIMEN COL (MISCELLANEOUS) IMPLANT
UNDERPAD 30X30 INCONTINENT (UNDERPADS AND DIAPERS) ×3 IMPLANT
WATER STERILE IRR 1000ML POUR (IV SOLUTION) ×3 IMPLANT

## 2014-03-26 NOTE — Anesthesia Procedure Notes (Addendum)
Procedure Name: Intubation Date/Time: 03/26/2014 12:39 PM Performed by: Angelica Pou Pre-anesthesia Checklist: Patient identified, Timeout performed, Emergency Drugs available, Suction available and Patient being monitored Patient Re-evaluated:Patient Re-evaluated prior to inductionOxygen Delivery Method: Circle system utilized Preoxygenation: Pre-oxygenation with 100% oxygen Intubation Type: IV induction Ventilation: Mask ventilation without difficulty Laryngoscope Size: Mac and 3 Grade View: Grade I Tube type: Oral Tube size: 7.0 mm Number of attempts: 1 Placement Confirmation: ETT inserted through vocal cords under direct vision,  breath sounds checked- equal and bilateral and positive ETCO2 Secured at: 21 cm Tube secured with: Tape Dental Injury: Teeth and Oropharynx as per pre-operative assessment  Comments: Smooth IV induction by Dr Berneice Heinrich, easy atraumatic intubation by J.Avonte Sensabaugh CRNA. +etCO2, BS=B, checked by Dr Berneice Heinrich.

## 2014-03-26 NOTE — Anesthesia Postprocedure Evaluation (Signed)
Anesthesia Post Note  Patient: Elizabeth [name]  Procedure(s) Performed: Procedure(s) (LRB):  POPLITEAL AND TIBIAL ARTERY ENDARTERECTOMY (Left) AMPUTATION SECOND LEFT TOE (Left)  Anesthesia type: general  Patient location: PACU  Post pain: Pain level controlled  Post assessment: Patient's Cardiovascular Status Stable  Last Vitals:  Filed Vitals:   03/26/14 1800  BP: 121/71  Pulse: 101  Temp:   Resp: 16    Post vital signs: Reviewed and stable  Level of consciousness: sedated  Complications: No apparent anesthesia complications

## 2014-03-26 NOTE — H&P (View-Only) (Signed)
Operative plan for tomorrow discussed with pt.  Risks benefits complications.  Bleeding infection possible myocardial events limb loss.  PE:  Filed Vitals:   03/24/14 2000 03/24/14 2216 03/25/14 0508 03/25/14 1004  BP: 95/51 107/56 113/64 107/67  Pulse: 84 84 91 85  Temp:  98.7 F (37.1 C) 98 F (36.7 C)   TempSrc:  Oral Oral   Resp:  16 17   Height:      Weight:   81 lb 12.7 oz (37.1 kg)   SpO2: 94% 98% 90%     Dry gangrene left second toe unchanged.  Left 2nd toe amp and popliteal endarterectomy tomorrow  Arnett Duddy, MD [facility] [place] Office: [phone] Pager: [phone]  

## 2014-03-26 NOTE — Transfer of Care (Signed)
Immediate Anesthesia Transfer of Care Note  Patient: Elizabeth [name]  Procedure(s) Performed: Procedure(s):  POPLITEAL AND TIBIAL ARTERY ENDARTERECTOMY (Left) AMPUTATION SECOND LEFT TOE (Left)  Patient Location: PACU  Anesthesia Type:General  Level of Consciousness: awake, patient cooperative, lethargic and responds to stimulation  Airway & Oxygen Therapy: Patient Spontanous Breathing and Patient connected to nasal cannula oxygen  Post-op Assessment: Report given to RN, Post -op Vital signs reviewed and stable, Patient moving all extremities and Patient able to stick tongue midline  Post vital signs: Reviewed and stable  Complications: No apparent anesthesia complications

## 2014-03-26 NOTE — Progress Notes (Signed)
NUTRITION FOLLOW UP  Pt meets criteria for SEVERE MALNUTRITION in the context of chronic illness as evidenced by a 5.8% weight loss in 1 month and severe fat and muscle mass loss.  DOCUMENTATION CODES Per approved criteria  -Severe malnutrition in the context of chronic illness -Underweight    Intervention:    Continue Nepro Shake po TID, each supplement provides 425 kcal and 19 grams protein.  Encourage adequate PO intake.   Nutrition Dx:   Increased nutrient needs related to chronic illness, repletion, surgery and  ESRD as evidenced by estimated nutrition needs. Ongoing.   Goal:   Intake to meet >90% of estimated nutrition needs.  Monitor:   PO intake, labs, weight trend, energy/protein needs  Assessment:   Pt with Past medical history of Dyslipidemia, recent CVA, peripheral vascular disease, hypertension, diabetes mellitus. The patient presented with complaints of infection ofLeft foot toe.  Patient not in room at time of RD visit. Currently NPO for endarterectomy popliteal/tibial and amputation of second toe. Nurses say pt is eating fairly well (50-75% per [facility])  Height: Ht Readings from Last 1 Encounters:  03/17/14 5\' 3"  (1.6 m)    Weight Status:   Wt Readings from Last 1 Encounters:  03/26/14 84 lb 3.5 oz (38.2 kg)    Body mass index is 14.92 kg/(m^2).   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   Diet Order: Diet NPO time specified Except for: Sips with Meds   Intake/Output Summary (Last 24 hours) at 03/26/14 1351 Last data filed at 03/26/14 1300  Gross per 24 hour  Intake    360 ml  Output   1650 ml  Net  -1290 ml   Per [facility]: Pt has been eating 50-75% of meals  Last BM: 2/2  Labs:   Recent Labs Lab 03/20/14 0840  03/24/14 0550 03/25/14 1300 03/26/14 0316  NA 132*  < > 134* 134* 134*  K 4.0  < > 3.6 3.5 3.7  CL 95*  < > 98 96 100  CO2 28  < > 27 28 26   BUN 36*   < > 18 29* 11  CREATININE 4.08*  < > 3.19* 4.80* 2.65*  CALCIUM 7.7*  < > 8.0* 7.9* 7.7*  PHOS 5.2*  --   --  4.6  --   GLUCOSE 115*  < > 167* 238* 148*  < > = values in this interval not displayed.  CBG (last 3)   Recent Labs  03/26/14 0654 03/26/14 1041 03/26/14 1123  GLUCAP 155* 138* 126*    Scheduled Meds: . [MAR Hold] aspirin EC  81 mg Oral Daily  . [MAR Hold] darbepoetin (ARANESP) injection - DIALYSIS  200 mcg Intravenous Q Tue-HD  . [MAR Hold] feeding supplement (RESOURCE BREEZE)  1 Container Oral TID WC  . [MAR Hold] heparin  5,000 Units Subcutaneous 3 times per day  . [MAR Hold] insulin aspart  0-15 Units Subcutaneous TID WC  . [MAR Hold] insulin aspart  0-5 Units Subcutaneous QHS  . [MAR Hold] metoprolol succinate  12.5 mg Oral Daily  . [MAR Hold] piperacillin-tazobactam (ZOSYN)  IV  2.25 g Intravenous 3 times per day  . [MAR Hold] pravastatin  20 mg Oral q1800  . [MAR Hold] saccharomyces boulardii  250 mg Oral BID  . [MAR Hold] sevelamer carbonate  800 mg Oral TID WC  . [MAR Hold] vancomycin  500 mg Intravenous Q T,Th,Sa-HD  Continuous Infusions: . sodium chloride 10 mL/hr at 03/26/14 [address], [place] [facility] [phone] 03/26/2014 1:52 PM

## 2014-03-26 NOTE — Progress Notes (Signed)
[facility] PROGRESS NOTE   Lyliah Lindseth DDU:[id number] DOB: Sep 06, 1945 DOA: 03/17/2014 PCP: [name], MD  HPI: The patient presented with complaints of infection ofLeft foot toe. She was found to have gangrenous changes. She was subsequently admitted to the hospital. Vascular surgery was consulted. She underwent arteriogram. Plan is for surgical intervention if cleared by cardiology. She underwent stress test on 1/29 , along with echocardiogram. EF was noted to be low in the 30s. Subsequently, she underwent cardiac catheterization. Nonobstructive CAD was noted.  Subjective: Patient continues to feel the same. Denies any complaints.   Assessment/Plan: Principal Problem:   Cellulitis of foot Active Problems:   HLD (hyperlipidemia)   Anemia of chronic disease   End stage renal disease   Protein-calorie malnutrition, severe   Neuropathy   Ulcer of toe of left foot   Encounter for preoperative examination for general surgical procedure, vascular   Preop cardiovascular exam   DM type 2 (diabetes mellitus, type 2)   Black toe   Arterial hypotension   Cardiomyopathy, ischemic   Abnormal nuclear stress test   Systolic dysfunction, left ventricle   Coronary arteriosclerosis   Cellulitis of Left foot/Ulcer of Toe of Left Foot -Due to peripheral vascular disease and diabetes. -MRI w/o contrast of left foot showed "Devitalization of the middle phalanx of the second toe. Abnormal signal from the proximal phalanx of the second toe is most likely due being devitalized. There is no discrete destruction of the proximal phalanx. Adjacent cellulitis. The distal phalanx of the second toe is missing." -Bilateral lower extremity venous duplex showed no evidence of DVT, superficial thrombosis, or Baker's cyst. -Continue vancomycin and zosyn for now. Can likely be discontinued or de-escalated after surgery. Blood cultures negative so far. -Patient underwent arteriogram 1/27. Vascular  surgery is planning popliteal tibial endarterectomy and toe amputation. Plan is for surgical intervention today. Cardiac workup is completed and she may proceed to surgery. She was started on low-dose beta blocker.   Chronic systolic dysfunction  Seen on stress test and echocardiogram. Cardiology is following. She is well compensated clinically possibly because of dialysis.  Diarrhea Appears to have resolved. C. difficile is negative. Can give Imodium if she has recurrence. Probiotics  DM Type 2, uncontrolled Continue NovoLOG on sliding scale. CBGs are reasonably well controlled. Continue to monitor  End stage renal disease TTS/Hyponatremia -Renal failure likely due to diabetes -Management per Nephrology.    Hyperlipidemia -Continue pravastatin.   History of essential Hypertension with episodes of hypotension Blood pressure remains stable.    Anemia of Chronic disease -Hgb remains stable.  Severe protein calorie malnutrition Continue with protein supplements.  DVT Prophylaxis: Heparin Code Status: Full Code Family Communication: Plan discussed with the patient. Disposition Plan: Not ready for discharge  Consultants:  Vascular surgery  Nephrology  Cardiology  Procedures: Arteriogram 03/19/2014 Operative findings: #1 occlusion anterior tibial and posterior tibial arteries bilaterally   #2 90% stenosis origin left peroneal artery   #3 one vessel runoff right peroneal with in-line flow  Nuclear stress test 1/29 IMPRESSION: 1. Inferior wall scar. No ischemia. 2. Mild global hypokinesis and inferior wall motion abnormality. 3. Left ventricular ejection fraction 35% 4. Intermediate-risk stress test findings*.  2D ECHO Study Conclusions - Left ventricle: The cavity size was normal. Wall thickness wasincreased in a pattern of mild LVH. Systolic function wasmoderately to severely reduced. The estimated  ejection fractionwas in the range of 30% to 35%. Severe hypokinesis of theinferior myocardium. - Mitral valve: There was mild regurgitation. -  Left atrium: The atrium was mildly dilated. - Pulmonary arteries: PA peak pressure: 31 mm Hg (S). - Pericardium, extracardiac: A trivial pericardial effusion wasidentified. There was a left pleural effusion.  Cardiac catheterization 2/1  Antibiotics:  Vancomycin 1/25>>  Zosyn 1/25>>   Objective: Filed Vitals:   03/26/14 1000  BP: 123/76  Pulse: 103  Temp:   Resp:     Intake/Output Summary (Last 24 hours) at 03/26/14 1601 Last data filed at 03/26/14 1430  Gross per 24 hour  Intake    760 ml  Output   1865 ml  Net  -1105 ml   Filed Weights   03/25/14 1310 03/25/14 1653 03/26/14 0533  Weight: 39 kg (85 lb 15.7 oz) 37.6 kg (82 lb 14.3 oz) 38.2 kg (84 lb 3.5 oz)    Exam: General: Alert and awake, oriented x3, not in any acute distress, CVS: S1-S2 normal, regular. No rubs, or bruit. Chest: clear to auscultation bilaterally, no wheezing, rales or rhonchi;  Abdomen: soft, nontender, nondistended, quiet bowel sounds, scar in hypogastric region (pt says from suprapubic catheter) Extremities: Left foot second toe is black and necrotic; left foot is tender to light palpation. Pedal pulses difficult to palpate. No focal deficits  Data Reviewed: Basic Metabolic Panel:  Recent Labs Lab 03/20/14 0840 03/23/14 0315 03/24/14 0550 03/25/14 1300 03/26/14 0316  NA 132* 131* 134* 134* 134*  K 4.0 4.2 3.6 3.5 3.7  CL 95* 94* 98 96 100  CO2 GLUCOSE 115* 157* 167* 238* 148*  BUN 36* 34* 18 29* 11  CREATININE 4.08* 4.68* 3.19* 4.80* 2.65*  CALCIUM 7.7* 8.0* 8.0* 7.9* 7.7*  PHOS 5.2*  --   --  4.6  --    Liver Function Tests:  Recent Labs Lab 03/20/14 0840 03/25/14 1300  ALBUMIN 1.8* 1.5*   CBC:  Recent Labs Lab 03/22/14 0350 03/23/14 0315 03/24/14 0550 03/25/14 1300 03/26/14 0316  WBC 9.0 8.6 8.9 10.4  9.8  HGB 8.8* 9.3* 10.5* 9.6* 9.9*  HCT 28.3* 30.1* 34.2* 30.9* 32.0*  MCV 81.3 82.7 84.9 83.5 82.7  PLT 183 203 208 227 247   CBG:  Recent Labs Lab 03/25/14 1732 03/25/14 2049 03/26/14 0654 03/26/14 1041 03/26/14 1123  GLUCAP 131* 188* 155* 138* 126*    Micro Recent Results (from the past 240 hour(s))  Culture, blood (single)     Status: None   Collection Time: 03/17/14  5:31 PM  Result Value Ref Range Status   Specimen Description BLOOD ARM RIGHT  Final   Special Requests BOTTLES DRAWN AEROBIC AND ANAEROBIC 5CC  Final   Culture   Final    NO GROWTH 5 DAYS Performed at [facility]    Report Status 03/24/2014 FINAL  Final  MRSA PCR Screening     Status: None   Collection Time: 03/21/14  7:23 AM  Result Value Ref Range Status   MRSA by PCR NEGATIVE NEGATIVE Final    Comment:        The GeneXpert MRSA Assay (FDA approved for NASAL specimens only), is one component of a comprehensive MRSA colonization surveillance program. It is not intended to diagnose MRSA infection nor to guide or monitor treatment for MRSA infections.   Clostridium Difficile by PCR     Status: None   Collection Time: 03/21/14  8:43 PM  Result Value Ref Range Status   C difficile by pcr NEGATIVE NEGATIVE Final     Studies: No results found.  Scheduled Meds: . [MAR Hold] aspirin EC  81 mg Oral Daily  . [MAR Hold] darbepoetin (ARANESP) injection - DIALYSIS  200 mcg Intravenous Q Tue-HD  . [MAR Hold] feeding supplement (RESOURCE BREEZE)  1 Container Oral TID WC  . [MAR Hold] heparin  5,000 Units Subcutaneous 3 times per day  . [MAR Hold] insulin aspart  0-15 Units Subcutaneous TID WC  . [MAR Hold] insulin aspart  0-5 Units Subcutaneous QHS  . [MAR Hold] metoprolol succinate  12.5 mg Oral Daily  . [MAR Hold] piperacillin-tazobactam (ZOSYN)  IV  2.25 g Intravenous 3 times per day  . [MAR Hold] pravastatin  20 mg Oral q1800  . [MAR Hold] saccharomyces boulardii  250 mg Oral BID  .  [MAR Hold] sevelamer carbonate  800 mg Oral TID WC  . [MAR Hold] vancomycin  500 mg Intravenous Q T,Th,Sa-HD   Continuous Infusions: . sodium chloride 10 mL/hr at 03/26/14 1128     Time spent: 20 minutes   [facility], [place] Albornoz [facility] Pager [phone]   If 7PM-7AM, please contact night-coverage at [email or web address], password [facility] 03/26/2014, 4:01 PM  LOS: 9 days

## 2014-03-26 NOTE — Progress Notes (Signed)
  Day of Surgery Note    Subjective:  Sleepy, but wakes to voice.    Filed Vitals:   03/26/14 1600  BP: 108/61  Pulse: 98  Temp: 97.9 F (36.6 C)  Resp: 12    Incisions:   Left 2nd toe amp site with bloody drainage Extremities:  + audible left PT/peroneal doppler signal Lungs:  Non labored    Assessment/Plan:  This is a 69 y.o. female who is s/p Left popliteal and peroneal artery endarterectomy with vein patch angioplasty, left second toe amputation  -left foot dressing saturated-dressing removed and wound packed with dry 4x4 and re-wrapped with Kerlix.  Pt tolerated well. -she has audible doppler signals in the left PT/peroneal  -there are no stepdown beds available-will send to [address] per Dr. Bynum Bellows, PA-C 03/26/2014 4:19 PM

## 2014-03-26 NOTE — Anesthesia Preprocedure Evaluation (Addendum)
Anesthesia Evaluation  Patient identified by MRN, date of birth, ID band Patient awake    Reviewed: Allergy & Precautions, NPO status , Patient's Chart, lab work & pertinent test results, reviewed documented beta blocker date and time   History of Anesthesia Complications Negative for: history of anesthetic complications  Airway Mallampati: II  TM Distance: >3 FB Neck ROM: Full    Dental  (+) Poor Dentition, Missing, Dental Advisory Given   Pulmonary  breath sounds clear to auscultation  Pulmonary exam normal       Cardiovascular hypertension, Pt. on medications + CAD (stress test 2014 EF 44% some fixed akinisia) and + Peripheral Vascular Disease Rhythm:Regular Rate:Normal  EF 30-35%   Neuro/Psych CVA, No Residual Symptoms    GI/Hepatic negative GI ROS, Neg liver ROS,   Endo/Other  diabetes, Type 2, Insulin Dependent  Renal/GU Dialysis and ESRFRenal disease     Musculoskeletal  (+) Arthritis -,   Abdominal (+)  Abdomen: soft.    Peds  Hematology  (+) anemia ,   Anesthesia Other Findings   Reproductive/Obstetrics                           Anesthesia Physical Anesthesia Plan  ASA: IV  Anesthesia Plan: General   Post-op Pain Management:    Induction: Intravenous  Airway Management Planned: Oral ETT  Additional Equipment:   Intra-op Plan:   Post-operative Plan: Extubation in OR  Informed Consent: I have reviewed the patients History and Physical, chart, labs and discussed the procedure including the risks, benefits and alternatives for the proposed anesthesia with the patient or authorized representative who has indicated his/her understanding and acceptance.   Dental advisory given  Plan Discussed with: CRNA and Surgeon  Anesthesia Plan Comments:        Anesthesia Quick Evaluation

## 2014-03-26 NOTE — Op Note (Signed)
Procedure: Left popliteal and peroneal artery endarterectomy with vein patch angioplasty, left second toe amputation  Preoperative diagnosis: Gangrene left second toe in  Postoperative diagnosis: Same  Anesthesia: Gen.  Assistant: Leonides Sake M.D.  Operative findings: #1 necrotic tissue extending to mid shaft of metatarsal bone second toe  Operative details: After informed consent, the patient seen the operating room. The patient placed supine position on the operating room table. After induction of general anesthesia and endotracheal intubation, the patient's entire left lower extremity is prepped and draped in usual sterile fashion. Next a longitudinal incision was made on the medial aspect of the left leg below the knee. Incision was carried into subcutaneous tissues down level the saphenous vein. Saphenous vein was approximately 2 half millimeters in diameter. It was dissected free circumferentially and small side branches ligated and divided between clips or ties. The vein was then reflected posteriorly. The incision was deepened down into the below-knee popliteal space. The popliteal artery was dissected free circumferentially just above the takeoff the tibial vessels. The popliteal artery had a good pulse within it and this segment. The posterior tibial artery anterior tibial artery and peroneal arteries were then all dissected free circumferentially. I then carried the dissection down the peroneal artery proximally 4 cm. Small side branches were controlled with clips. The patient was given 5000 units of intravenous heparin. The distal saphenous vein was ligated with a 3-0 silk tie. The proximal vein was also ligated with a 3-0 silk tie. The vein was transected. The vein was gently distended with saline and opened longitudinally. It was then placed in a reversed configuration. The anterior and posterior tibial arteries were controlled with vessel loops. The popliteal artery was controlled with a  fistula clamp. The peroneal artery was controlled also with a vessel loop. A longitudinal opening was then made in the below-knee popliteal artery and this arteriotomy extended down into the peroneal artery until I got past the larger calcification  to a more soft artery. An endarterectomy was performed in this location. The vein patch was then brought up in the operative field and sewn on as a patch angioplasty using a running 6-0 Prolene suture. The patch extended from the distal 2 cm of the below-knee popliteal artery to 4 cm down the peroneal artery. Everything was forebled backbled and thoroughly flushed. Anastomosis was secured clamps released there was one area of bleeding on the distal medial aspect of the patch and this was repaired with 3 interrupted 7-0 Prolene sutures. There was a good pulse width in the endarterectomized segment and good Doppler flow in the distal peroneal artery. The patient also had a biphasic peroneal Doppler signal and a monophasic to biphasic posterior tibial Doppler signal at this point. Hemostasis was obtained with thrombin and Gelfoam as well as 50 mg protamine. The deep layers of the below-knee incision was closed with running 2-0 Vicryl suture. The subcutaneous layers closed with running 3-0 Vicryl stitch. The skin was closed with 4-0 Vicryl subcuticular stitch. Attention was then turned to the left foot. A circumferential incision was made at the base of the left second toe. Bone cutter was then used to transect the proximal phalanx. Toe was passed off table as specimen. Proximal phalanx was debrided back all the way to the metatarsal head. There was a large amount of necrotic tissue in this portion of the foot. I debrided as much as this is possible but still did not get back to clean tissue completely. I debrided the bone back  to the mid shaft of the metatarsal. The bone was of much better quality in this location. The wound was thoroughly irrigated with normal saline  solution. This was then packed with a normal saline wet to dry dressing. The patient tolerated the procedure well and there were no complications. Sponge and needle counts correct in the case. Patient was taken to the recovery room in stable condition.  Fabienne Bruns, MD [facility] Office: [phone] Pager: [phone]

## 2014-03-26 NOTE — Progress Notes (Signed)
Problem/Plan:  1. L foot ischemia / early gangrene - for surgery by [facility]  today 2. Systolic heart failure - inf scar, no old echos, EF 30-35%. Cardiology cleared 3. ESRD - TTS HD, next HD Thursday. 4. Hypertension/volume - BP's OK , holding coreg  Subjective: Interval History: For OR today  Objective: Vital signs in last 24 hours: Temp:  [97.8 F (36.6 C)-98.4 F (36.9 C)] 97.8 F (36.6 C) (02/03 0533) Pulse Rate:  [83-105] 97 (02/03 0533) Resp:  [12-19] 19 (02/03 0533) BP: (90-126)/(43-71) 126/71 mmHg (02/03 0533) SpO2:  [93 %-97 %] 97 % (02/03 0533) Weight:  [37.6 kg (82 lb 14.3 oz)-39 kg (85 lb 15.7 oz)] 38.2 kg (84 lb 3.5 oz) (02/03 0533) Weight change: 1.9 kg (4 lb 3 oz)  Intake/Output from previous day: 02/02 0701 - 02/03 0700 In: 720 [P.O.:720] Out: 1550 [Urine:50] Intake/Output this shift:    General appearance: alert and cooperative Resp: clear to auscultation bilaterally Chest wall: no tenderness Cardio: regular rate and rhythm, S1, S2 normal, no murmur, click, rub or gallop GI: soft, non-tender; bowel sounds normal; no masses,  no organomegaly  Lab Results:  Recent Labs  03/25/14 1300 03/26/14 0316  WBC 10.4 9.8  HGB 9.6* 9.9*  HCT 30.9* 32.0*  PLT 227 247   BMET:  Recent Labs  03/25/14 1300 03/26/14 0316  NA 134* 134*  K 3.5 3.7  CL 96 100  CO2 28 26  GLUCOSE 238* 148*  BUN 29* 11  CREATININE 4.80* 2.65*  CALCIUM 7.9* 7.7*   No results for input(s): PTH in the last 72 hours. Iron Studies: No results for input(s): IRON, TIBC, TRANSFERRIN, FERRITIN in the last 72 hours. Studies/Results: No results found.  Scheduled: . aspirin EC  81 mg Oral Daily  . darbepoetin (ARANESP) injection - DIALYSIS  200 mcg Intravenous Q Tue-HD  . feeding supplement (RESOURCE BREEZE)  1 Container Oral TID WC  . heparin  5,000 Units Subcutaneous 3 times per day  . insulin aspart  0-15 Units Subcutaneous TID WC  . insulin aspart   0-5 Units Subcutaneous QHS  . metoprolol succinate  12.5 mg Oral Daily  . piperacillin-tazobactam (ZOSYN)  IV  2.25 g Intravenous 3 times per day  . pravastatin  20 mg Oral q1800  . saccharomyces boulardii  250 mg Oral BID  . sevelamer carbonate  800 mg Oral TID WC  . vancomycin  500 mg Intravenous Q T,Th,Sa-HD      LOS: 9 days   Elizabeth [name] C 03/26/2014,10:30 AM

## 2014-03-26 NOTE — Interval H&P Note (Signed)
History and Physical Interval Note:  03/26/2014 12:02 PM  Elizabeth [name]  has presented today for surgery, with the diagnosis of Peripheral vascular disease with left foot ulcer I70.244  The various methods of treatment have been discussed with the patient and family. After consideration of risks, benefits and other options for treatment, the patient has consented to  Procedure(s): ENDARTERECTOMY POPLITEAL AND TIBIAL (Left) AMPUTATION SECOND TOE (Left) as a surgical intervention .  The patient's history has been reviewed, patient examined, no change in status, stable for surgery.  I have reviewed the patient's chart and labs.  Questions were answered to the patient's satisfaction.     FIELDS,CHARLES E

## 2014-03-27 ENCOUNTER — Encounter (HOSPITAL_COMMUNITY): Payer: Self-pay | Admitting: Vascular Surgery

## 2014-03-27 LAB — BASIC METABOLIC PANEL
ANION GAP: 10 (ref 5–15)
BUN: 16 mg/dL (ref 6–23)
CALCIUM: 7.6 mg/dL — AB (ref 8.4–10.5)
CO2: 24 mmol/L (ref 19–32)
CREATININE: 3.75 mg/dL — AB (ref 0.50–1.10)
Chloride: 102 mmol/L (ref 96–112)
GFR calc non Af Amer: 11 mL/min — ABNORMAL LOW (ref >90)
GFR, EST AFRICAN AMERICAN: 13 mL/min — AB (ref >90)
GLUCOSE: 105 mg/dL — AB (ref 70–99)
POTASSIUM: 4.1 mmol/L (ref 3.5–5.1)
Sodium: 136 mmol/L (ref 135–145)

## 2014-03-27 LAB — CBC
HCT: 26.8 % — ABNORMAL LOW (ref 36.0–46.0)
Hemoglobin: 8.2 g/dL — ABNORMAL LOW (ref 12.0–15.0)
MCH: 26.3 pg (ref 26.0–34.0)
MCHC: 30.6 g/dL (ref 30.0–36.0)
MCV: 85.9 fL (ref 78.0–100.0)
PLATELETS: 227 10*3/uL (ref 150–400)
RBC: 3.12 MIL/uL — AB (ref 3.87–5.11)
RDW: 18.3 % — AB (ref 11.5–15.5)
WBC: 12.9 10*3/uL — AB (ref 4.0–10.5)

## 2014-03-27 LAB — GLUCOSE, CAPILLARY
Glucose-Capillary: 192 mg/dL — ABNORMAL HIGH (ref 70–99)
Glucose-Capillary: 274 mg/dL — ABNORMAL HIGH (ref 70–99)

## 2014-03-27 MED ORDER — SODIUM CHLORIDE 0.9 % IV SOLN: 100.0000 mL | INTRAVENOUS | Status: DC | PRN

## 2014-03-27 MED ORDER — HEPARIN SODIUM (PORCINE) 1000 UNIT/ML DIALYSIS: 20.0000 [IU]/kg | INTRAMUSCULAR | Status: DC | PRN

## 2014-03-27 MED ORDER — ALTEPLASE 2 MG IJ SOLR: 2.0000 mg | Freq: Once | INTRAMUSCULAR | Status: DC | PRN

## 2014-03-27 MED ORDER — PENTAFLUOROPROP-TETRAFLUOROETH EX AERO: 1.0000 "application " | INHALATION_SPRAY | CUTANEOUS | Status: DC | PRN

## 2014-03-27 MED ORDER — HEPARIN SODIUM (PORCINE) 1000 UNIT/ML DIALYSIS: 1000.0000 [IU] | INTRAMUSCULAR | Status: DC | PRN

## 2014-03-27 MED ORDER — CARVEDILOL 6.25 MG PO TABS
6.2500 mg | ORAL_TABLET | Freq: Two times a day (BID) | ORAL | Status: DC
  Administered 2014-03-27 – 2014-03-28 (×2): 6.25 mg via ORAL
  Filled 2014-03-27 (×5): qty 1

## 2014-03-27 MED ORDER — LIDOCAINE-PRILOCAINE 2.5-2.5 % EX CREA: 1.0000 "application " | TOPICAL_CREAM | CUTANEOUS | Status: DC | PRN

## 2014-03-27 MED ORDER — NEPRO/CARBSTEADY PO LIQD
237.0000 mL | Freq: Three times a day (TID) | ORAL | Status: DC
  Administered 2014-03-27 – 2014-03-28 (×4): 237 mL via ORAL
  Filled 2014-03-27 (×7): qty 237

## 2014-03-27 MED ORDER — NEPRO/CARBSTEADY PO LIQD: 237.0000 mL | ORAL | Status: DC | PRN

## 2014-03-27 MED ORDER — LIDOCAINE HCL (PF) 1 % IJ SOLN: 5.0000 mL | INTRAMUSCULAR | Status: DC | PRN

## 2014-03-27 MED ORDER — SODIUM CHLORIDE 0.9 % IV BOLUS (SEPSIS)
100.0000 mL | Freq: Once | INTRAVENOUS | Status: AC
  Administered 2014-03-27: 100 mL via INTRAVENOUS

## 2014-03-27 NOTE — Progress Notes (Signed)
PT Cancellation Note  Patient Details Name: Elizabeth [name] MRN: [id number] DOB: 19-Jun-1945   Cancelled Treatment:    Reason Eval/Treat Not Completed: Patient at procedure or test/unavailable (await Darco shoe for ambulation and pt currently in HD)   [name] 03/27/2014, 9:31 AM [name], PT [phone]

## 2014-03-27 NOTE — OR Nursing (Signed)
Procedure updated 03/27/2014 at [phone]

## 2014-03-27 NOTE — Evaluation (Signed)
Occupational Therapy Evaluation Patient Details Name: Elizabeth [name] MRN: [id number] DOB: [birth year] Today's Date: 03/27/2014    History of Present Illness Pt admitted with L foot infection with gangrenous changes due to PVD on 1/25. Underwent cardiac cath following stress test that revealed CAD with EF of 35%.  Pt now s/p pop and peroneal arteriogram and second toe amputation.  PMH: ESRD, HTN, neuropathy, DM.   Clinical Impression   Pt was living alone prior to admission. She reports relying on her manual w/c with some short distance ambulation holding furniture.  She was able to perform self care and light meal prep.  She had hired assistance for housekeeping. Limited evaluation this date following dialysis and no post op shoe yet in room. Pt with increased pain once sitting up with foot in dependent position, so tolerated only briefly.  Pt will need post acute rehab upon d/c as she does not have 24 hour care.    Follow Up Recommendations    SNF, 24 hour supervision   Equipment Recommendations       Recommendations for Other Services       Precautions / Restrictions Precautions Precautions: Fall Restrictions Weight Bearing Restrictions: No Other Position/Activity Restrictions: RN to order [facility] shoe      Mobility Bed Mobility Overal bed mobility: Needs Assistance Bed Mobility: Rolling;Sidelying to Sit;Sit to Sidelying Rolling: Min guard Sidelying to sit: Mod assist     Sit to sidelying: Mod assist General bed mobility comments: reliance on rail, assist to raise trunk and to lift LEs back in bed  Transfers                 General transfer comment: deferred transfer due to pain and no post op shoe yet in room    Balance                                            ADL Overall ADL's : Needs assistance/impaired Eating/Feeding: Independent;Sitting   Grooming: Wash/dry hands;Wash/dry face;Minimal assistance;Sitting   Upper Body Bathing: Minimal  assitance;Sitting   Lower Body Bathing: Maximal assistance;Sitting/lateral leans   Upper Body Dressing : Minimal assistance;Sitting   Lower Body Dressing: Maximal assistance                       Vision                 Additional Comments: states R eye appears hazy   Perception     Praxis      Pertinent Vitals/Pain Pain Assessment: Faces Faces Pain Scale: Hurts even more Pain Location: L foot Pain Descriptors / Indicators: Aching Pain Intervention(s): Monitored during session;Limited activity within patient's tolerance;Repositioned;Patient requesting pain meds-RN notified     Hand Dominance Right   Extremity/Trunk Assessment Upper Extremity Assessment Upper Extremity Assessment: Generalized weakness   Lower Extremity Assessment Lower Extremity Assessment: Defer to PT evaluation       Communication Communication Communication: No difficulties   Cognition Arousal/Alertness: Awake/alert Behavior During Therapy: WFL for tasks assessed/performed Overall Cognitive Status: Within Functional Limits for tasks assessed                     General Comments       Exercises       Shoulder Instructions      Home Living Family/patient expects to be discharged to:: Private  residence Living Arrangements: Alone Available Help at Discharge: Family;Available PRN/intermittently Type of Home: Apartment Home Access: Elevator     Home Layout: One level     Bathroom Shower/Tub: Tub/shower unit Shower/tub characteristics: Curtain Firefighter: Standard     Home Equipment: Grab bars - tub/shower;Wheelchair - manual;Hospital bed          Prior Functioning/Environment Level of Independence: Independent with assistive device(s)        Comments: used w/c or furniture walked around home, goes to HD in w/c    OT Diagnosis: Generalized weakness;Acute pain   OT Problem List: Decreased strength;Decreased activity tolerance;Impaired balance  (sitting and/or standing);Decreased safety awareness;Decreased knowledge of use of DME or AE;Pain;Impaired sensation   OT Treatment/Interventions: Self-care/ADL training;DME and/or AE instruction;Therapeutic activities;Patient/family education;Balance training    OT Goals(Current goals can be found in the care plan section) Acute Rehab OT Goals Patient Stated Goal: to return home OT Goal Formulation: With patient Time For Goal Achievement: 04/10/14 Potential to Achieve Goals: Good ADL Goals Pt Will Perform Grooming: with min assist;standing Pt Will Perform Lower Body Bathing: with min assist;sit to/from stand Pt Will Perform Lower Body Dressing: with min assist;sit to/from stand Pt Will Transfer to Toilet: with min assist;ambulating Pt Will Perform Toileting - Clothing Manipulation and hygiene: with min assist;sit to/from stand  OT Frequency: Min 2X/week   Barriers to D/C: Decreased caregiver support          Co-evaluation              End of Session Nurse Communication: Other (comment);Patient requests pain meds (to order post op shoe)  Activity Tolerance: Patient limited by pain Patient left: in bed;with call bell/phone within reach;with nursing/sitter in room   Time: 1515-1530 OT Time Calculation (min): 15 min Charges:  OT General Charges $OT Visit: 1 Procedure OT Evaluation $Initial OT Evaluation Tier I: 1 Procedure G-Codes:    Evern Bio 03/27/2014, 3:55 PM

## 2014-03-27 NOTE — Progress Notes (Signed)
[facility] PROGRESS NOTE   Elizabeth [name] ZOX:[id number] DOB: Feb 12, 1946 DOA: 03/17/2014 PCP: [name], MD  HPI: The patient presented with complaints of infection ofLeft foot toe. She was found to have gangrenous changes. She was subsequently admitted to the hospital. Vascular surgery was consulted. She underwent arteriogram. Plan is for surgical intervention if cleared by cardiology. She underwent stress test on 1/29 , along with echocardiogram. EF was noted to be low in the 30s. Subsequently, she underwent cardiac catheterization. Nonobstructive CAD was noted.  Subjective: Patient continues to feel the same. Denies any complaints.   Assessment/Plan: Principal Problem:   Cellulitis of foot Active Problems:   HLD (hyperlipidemia)   Anemia of chronic disease   End stage renal disease   Protein-calorie malnutrition, severe   Neuropathy   Ulcer of toe of left foot   Encounter for preoperative examination for general surgical procedure, vascular   Preop cardiovascular exam   DM type 2 (diabetes mellitus, type 2)   Black toe   Arterial hypotension   Cardiomyopathy, ischemic   Abnormal nuclear stress test   Systolic dysfunction, left ventricle   Coronary arteriosclerosis   Cellulitis of Left foot/Ulcer of Toe of Left Foot -Due to peripheral vascular disease and diabetes. -MRI w/o contrast of left foot showed "Devitalization of the middle phalanx of the second toe. Abnormal signal from the proximal phalanx of the second toe is most likely due being devitalized. There is no discrete destruction of the proximal phalanx. Adjacent cellulitis. The distal phalanx of the second toe is missing." -Bilateral lower extremity venous duplex showed no evidence of DVT, superficial thrombosis, or Baker's cyst. - vancomycin and zosyn stopped on 03/27/14 after surgery. - Blood cultures negative so far. -Patient underwent arteriogram 1/27.  -s/p pop and peroneal endarterectomy and 2nd toe  amputation on 03/26/14  Chronic systolic dysfunction  Seen on stress test and echocardiogram. Cardiology is following. She is well compensated clinically possibly because of dialysis.  Diarrhea Appears to have resolved. C. difficile is negative. Can give Imodium if she has recurrence. Probiotics  DM Type 2, uncontrolled Continue NovoLOG on sliding scale. CBGs are reasonably well controlled. Continue to monitor  End stage renal disease TTS/Hyponatremia -Renal failure likely due to diabetes -Management per Nephrology.    Hyperlipidemia -Continue pravastatin.   History of essential Hypertension with episodes of hypotension Blood pressure remains stable.    Anemia of Chronic disease -Hgb remains stable.  Severe protein calorie malnutrition Continue with protein supplements.  DVT Prophylaxis: Heparin Code Status: Full Code Family Communication: Plan discussed with the patient. Disposition Plan: Will have PT reevaluate the patient.  Consultants:  Vascular surgery  Nephrology  Cardiology  Procedures: Arteriogram 03/19/2014 Operative findings: #1 occlusion anterior tibial and posterior tibial arteries bilaterally   #2 90% stenosis origin left peroneal artery   #3 one vessel runoff right peroneal with in-line flow  Nuclear stress test 1/29 IMPRESSION: 1. Inferior wall scar. No ischemia. 2. Mild global hypokinesis and inferior wall motion abnormality. 3. Left ventricular ejection fraction 35% 4. Intermediate-risk stress test findings*.  2D ECHO Study Conclusions - Left ventricle: The cavity size was normal. Wall thickness wasincreased in a pattern of mild LVH. Systolic function wasmoderately to severely reduced. The estimated ejection fractionwas in the range of 30% to 35%. Severe hypokinesis of theinferior myocardium. - Mitral valve: There was mild regurgitation. - Left atrium: The atrium was mildly  dilated. - Pulmonary arteries: PA peak pressure: 31 mm Hg (S). - Pericardium, extracardiac: A trivial pericardial effusion  wasidentified. There was a left pleural effusion.  Cardiac catheterization 2/1  Left popliteal and peroneal artery endarterectomy with vein patch angioplasty, left second toe amputation 2/3  Antibiotics:  Vancomycin 1/25>> 2/4  Zosyn 1/25>> 2/4   Objective: Filed Vitals:   03/27/14 1356  BP: 127/64  Pulse: 104  Temp: 98 F (36.7 C)  Resp: 16    Intake/Output Summary (Last 24 hours) at 03/27/14 1518 Last data filed at 03/27/14 1300  Gross per 24 hour  Intake    800 ml  Output   1550 ml  Net   -750 ml   Filed Weights   03/27/14 0500 03/27/14 0902 03/27/14 1300  Weight: 38.6 kg (85 lb 1.6 oz) 40.4 kg (89 lb 1.1 oz) 39.6 kg (87 lb 4.8 oz)    Exam: General: Alert and awake, oriented x3, not in any acute distress, CVS: S1-S2 normal, regular. No rubs, or bruit. Chest: clear to auscultation bilaterally, no wheezing, rales or rhonchi;  Abdomen: soft, nontender, nondistended, quiet bowel sounds, scar in hypogastric region (pt says from suprapubic catheter) Extremities: Left foot is bandaged at the surgical site, foot warm,  Data Reviewed: Basic Metabolic Panel:  Recent Labs Lab 03/24/14 0550 03/25/14 1300 03/26/14 0316 03/26/14 1710 03/27/14 0326  NA 134* 134* 134* 135 136  K 3.6 3.5 3.7 3.9 4.1  CL 98 96 100 100 102  CO2 27 28 26 22 24   GLUCOSE 167* 238* 148* 158* 105*  BUN 18 29* 11 14 16   CREATININE 3.19* 4.80* 2.65* 3.24* 3.75*  CALCIUM 8.0* 7.9* 7.7* 7.7* 7.6*  PHOS  --  4.6  --  4.0  --    Liver Function Tests:  Recent Labs Lab 03/25/14 1300 03/26/14 1710  ALBUMIN 1.5* 1.5*   CBC:  Recent Labs Lab 03/23/14 0315 03/24/14 0550 03/25/14 1300 03/26/14 0316 03/27/14 0326  WBC 8.6 8.9 10.4 9.8 12.9*  HGB 9.3* 10.5* 9.6* 9.9* 8.2*  HCT 30.1* 34.2* 30.9* 32.0* 26.8*  MCV 82.7 84.9 83.5 82.7 85.9  PLT 203 208 227 247 227    CBG:  Recent Labs Lab 03/26/14 1041 03/26/14 1123 03/26/14 1600 03/26/14 1929 03/26/14 2134  GLUCAP 138* 126* 151* 146* 110*    Micro Recent Results (from the past 240 hour(s))  Culture, blood (single)     Status: None   Collection Time: 03/17/14  5:31 PM  Result Value Ref Range Status   Specimen Description BLOOD ARM RIGHT  Final   Special Requests BOTTLES DRAWN AEROBIC AND ANAEROBIC 5CC  Final   Culture   Final    NO GROWTH 5 DAYS Performed at [facility]    Report Status 03/24/2014 FINAL  Final  MRSA PCR Screening     Status: None   Collection Time: 03/21/14  7:23 AM  Result Value Ref Range Status   MRSA by PCR NEGATIVE NEGATIVE Final    Comment:        The GeneXpert MRSA Assay (FDA approved for NASAL specimens only), is one component of a comprehensive MRSA colonization surveillance program. It is not intended to diagnose MRSA infection nor to guide or monitor treatment for MRSA infections.   Clostridium Difficile by PCR     Status: None   Collection Time: 03/21/14  8:43 PM  Result Value Ref Range Status   C difficile by pcr NEGATIVE NEGATIVE Final     Studies: No results found.  Scheduled Meds: . albumin human  12.5 g Intravenous Once  . aspirin EC  81 mg Oral Daily  . carvedilol  6.25 mg Oral BID WC  . darbepoetin (ARANESP) injection - DIALYSIS  200 mcg Intravenous Q Tue-HD  . docusate sodium  100 mg Oral Daily  . feeding supplement (RESOURCE BREEZE)  1 Container Oral TID WC  . heparin  5,000 Units Subcutaneous 3 times per day  . insulin aspart  0-15 Units Subcutaneous TID WC  . insulin aspart  0-5 Units Subcutaneous QHS  . pantoprazole  40 mg Oral Daily  . pravastatin  20 mg Oral q1800  . saccharomyces boulardii  250 mg Oral BID  . sevelamer carbonate  800 mg Oral TID WC   Continuous Infusions: . sodium chloride 10 mL/hr at 03/26/14 1128     Time spent: 25 minutes   Graceanne Guin MD [facility] Pager  [phone]   If 7PM-7AM, please contact night-coverage at [email or web address], password [facility] 03/27/2014, 3:18 PM  LOS: 10 days

## 2014-03-27 NOTE — Progress Notes (Signed)
OT Cancellation Note  Patient Details Name: Elizabeth [name] MRN: [id number] DOB: 1945-02-29   Cancelled Treatment:    Reason Eval/Treat Not Completed: Patient at procedure or test/ unavailable (HD). Will continue to follow.  Evern Bio 03/27/2014, 9:54 AM

## 2014-03-27 NOTE — Progress Notes (Signed)
Dr. Hart Rochester notified about progressive drop in BP since arrival from PACU, currently 79/56.  Orders received.  Will continue to monitor.  Roselie Awkward, RN

## 2014-03-27 NOTE — Progress Notes (Addendum)
   [facility] [place]  Subjective  - Doing well over all.  Pain is well controlled.   Objective 101/47 102 98.3 F (36.8 C) (Oral) 12 98%  Intake/Output Summary (Last 24 hours) at 03/27/14 0735 Last data filed at 03/27/14 0537  Gross per 24 hour  Intake   1200 ml  Output    365 ml  Net    835 ml    Left LE DP/AT doppler signals Left calf incision Clean and dry, compartments soft without hematoma. Left foot wrap in place no active bleeding.  Will leave ace in place. Heart tachycardia Lungs non labored  Assessment/Planning: POD #1 Left popliteal and peroneal artery endarterectomy with vein patch angioplasty, left second toe amputation Stable for transfer from a vascular point of view Encourage mobility she would benefit from a PT consult  Clinton Gallant [facility] 03/27/2014 7:35 AM --  Biphasic peroneal doppler Wound left foot overall clean start wet to dry dressings today Darco shoe for left foot Encourage nutrition and protein shakes as the wound in her foot will have increased metabolic demands Transfer 2W  Fabienne Bruns, MD [facility] [place] Office: [phone] Pager: [phone]    Laboratory Lab Results:  Recent Labs  03/26/14 0316 03/27/14 0326  WBC 9.8 12.9*  HGB 9.9* 8.2*  HCT 32.0* 26.8*  PLT 247 227   BMET  Recent Labs  03/26/14 1710 03/27/14 0326  NA 135 136  K 3.9 4.1  CL 100 102  CO2 22 24  GLUCOSE 158* 105*  BUN 14 16  CREATININE 3.24* 3.75*  CALCIUM 7.7* 7.6*    COAG Lab Results  Component Value Date   INR 1.21 03/24/2014   INR 1.07 03/18/2014   No results found for: PTT

## 2014-03-27 NOTE — Progress Notes (Signed)
  Problem/Plan:  1. L foot ischemia / early gangrene -s/p pop and peroneal endarterectomy and 2nd toe amputation 2. ESRD - TTS HD, next HD today    Subjective: Interval History: has no complaint of minimal discomfort post op.  Objective: Vital signs in last 24 hours: Temp:  [97.9 F (36.6 C)-98.6 F (37 C)] 98.3 F (36.8 C) (02/04 0421) Pulse Rate:  [88-105] 102 (02/04 0700) Resp:  [6-21] 12 (02/04 0700) BP: (74-133)/(44-80) 101/47 mmHg (02/04 0700) SpO2:  [94 %-100 %] 98 % (02/04 0700) Weight:  [37 kg (81 lb 9.1 oz)-38.6 kg (85 lb 1.6 oz)] 38.6 kg (85 lb 1.6 oz) (02/04 0500) Weight change: -2 kg (-4 lb 6.6 oz)  Intake/Output from previous day: 02/03 0701 - 02/04 0700 In: 1200 [I.V.:500; IV Piggyback:700] Out: 365 [Urine:165; Blood:200] Intake/Output this shift:    General appearance: alert and cooperative Resp: clear to auscultation bilaterally Chest wall: no tenderness Cardio: regular rate and rhythm, S1, S2 normal, no murmur, click, rub or gallop Extremities: no edema, left foot bandaged  Lab Results:  Recent Labs  03/26/14 0316 03/27/14 0326  WBC 9.8 12.9*  HGB 9.9* 8.2*  HCT 32.0* 26.8*  PLT 247 227   BMET:  Recent Labs  03/26/14 1710 03/27/14 0326  NA 135 136  K 3.9 4.1  CL 100 102  CO2 22 24  GLUCOSE 158* 105*  BUN 14 16  CREATININE 3.24* 3.75*  CALCIUM 7.7* 7.6*   No results for input(s): PTH in the last 72 hours. Iron Studies: No results for input(s): IRON, TIBC, TRANSFERRIN, FERRITIN in the last 72 hours. Studies/Results: No results found.  Scheduled: . albumin human  12.5 g Intravenous Once  . aspirin EC  81 mg Oral Daily  . carvedilol  6.25 mg Oral BID WC  . darbepoetin (ARANESP) injection - DIALYSIS  200 mcg Intravenous Q Tue-HD  . docusate sodium  100 mg Oral Daily  . feeding supplement (RESOURCE BREEZE)  1 Container Oral TID WC  . heparin  5,000 Units Subcutaneous 3 times per day  . insulin aspart   0-15 Units Subcutaneous TID WC  . insulin aspart  0-5 Units Subcutaneous QHS  . pantoprazole  40 mg Oral Daily  . piperacillin-tazobactam (ZOSYN)  IV  2.25 g Intravenous 3 times per day  . pravastatin  20 mg Oral q1800  . saccharomyces boulardii  250 mg Oral BID  . sevelamer carbonate  800 mg Oral TID WC  . vancomycin  500 mg Intravenous Q T,Th,Sa-HD     LOS: 10 days   Elizabeth [name] C 03/27/2014,8:07 AM

## 2014-03-27 NOTE — Progress Notes (Signed)
Patient Name: Elizabeth [name] Date of Encounter: 03/27/2014     Principal Problem:   Cellulitis of foot Active Problems:   HLD (hyperlipidemia)   Anemia of chronic disease   End stage renal disease   Protein-calorie malnutrition, severe   Neuropathy   Ulcer of toe of left foot   Encounter for preoperative examination for general surgical procedure, vascular   Preop cardiovascular exam   DM type 2 (diabetes mellitus, type 2)   Black toe   Arterial hypotension   Cardiomyopathy, ischemic   Abnormal nuclear stress test   Systolic dysfunction, left ventricle   Coronary arteriosclerosis    SUBJECTIVE  Denies any CP or SOB. Post op day 1  CURRENT MEDS . albumin human  12.5 g Intravenous Once  . aspirin EC  81 mg Oral Daily  . darbepoetin (ARANESP) injection - DIALYSIS  200 mcg Intravenous Q Tue-HD  . docusate sodium  100 mg Oral Daily  . feeding supplement (RESOURCE BREEZE)  1 Container Oral TID WC  . heparin  5,000 Units Subcutaneous 3 times per day  . insulin aspart  0-15 Units Subcutaneous TID WC  . insulin aspart  0-5 Units Subcutaneous QHS  . metoprolol succinate  12.5 mg Oral Daily  . pantoprazole  40 mg Oral Daily  . piperacillin-tazobactam (ZOSYN)  IV  2.25 g Intravenous 3 times per day  . pravastatin  20 mg Oral q1800  . saccharomyces boulardii  250 mg Oral BID  . sevelamer carbonate  800 mg Oral TID WC  . vancomycin  500 mg Intravenous Q T,Th,Sa-HD    OBJECTIVE  Filed Vitals:   03/27/14 0421 03/27/14 0500 03/27/14 0600 03/27/14 0700  BP:  105/57 94/45 101/47  Pulse:  100 99 102  Temp: 98.3 F (36.8 C)     TempSrc: Oral     Resp:  Height:      Weight:  85 lb 1.6 oz (38.6 kg)    SpO2:  99% 98% 98%    Intake/Output Summary (Last 24 hours) at 03/27/14 0725 Last data filed at 03/27/14 0537  Gross per 24 hour  Intake   1200 ml  Output    365 ml  Net    835 ml   Filed Weights   03/26/14 0533 03/26/14 2130 03/27/14 0500  Weight: 84 lb 3.5  oz (38.2 kg) 81 lb 9.1 oz (37 kg) 85 lb 1.6 oz (38.6 kg)    PHYSICAL EXAM  General: Thin elderly BF in NAD. Pleasant, NAD. Neuro: Alert and oriented X 3. Moves all extremities spontaneously. Psych: Normal affect. HEENT:  Normal  Neck: Supple without bruits or JVD. Lungs:  Resp regular and unlabored. Clear anteriorly. Heart: RRR no s3, s4, or murmurs. Abdomen: Soft, non-tender, non-distended, BS + x 4.  Extremities: No clubbing. Feet warm. S/p left second toe amputation  Accessory Clinical Findings  CBC  Recent Labs  03/26/14 0316 03/27/14 0326  WBC 9.8 12.9*  HGB 9.9* 8.2*  HCT 32.0* 26.8*  MCV 82.7 85.9  PLT 247 227   Basic Metabolic Panel  Recent Labs  03/25/14 1300  03/26/14 1710 03/27/14 0326  NA 134*  < > 135 136  K 3.5  < > 3.9 4.1  CL 96  < > 100 102  CO2 28  < > 22 24  GLUCOSE 238*  < > 158* 105*  BUN 29*  < > 14 16  CREATININE 4.80*  < > 3.24* 3.75*  CALCIUM 7.9*  < >  7.7* 7.6*  PHOS 4.6  --  4.0  --   < > = values in this interval not displayed.  TELE NSR with HR 80s    ECG  No new EKG  Echocardiogram 03/10/2014  - Left ventricle: The cavity size was normal. Wall thickness was increased in a pattern of mild LVH. Systolic function was moderately to severely reduced. The estimated ejection fraction was in the range of 30% to 35%. Severe hypokinesis of the inferior myocardium. - Mitral valve: There was mild regurgitation. - Left atrium: The atrium was mildly dilated. - Pulmonary arteries: PA peak pressure: 31 mm Hg (S). - Pericardium, extracardiac: A trivial pericardial effusion was identified. There was a left pleural effusion.     Radiology/Studies  Mr Foot Left Wo Contrast  03/18/2014   CLINICAL DATA:  Cellulitis of the left foot.  EXAM: MRI OF THE LEFT FOREFOOT WITHOUT CONTRAST  TECHNIQUE: Multiplanar, multisequence MR imaging was performed. No intravenous contrast was administered.  COMPARISON:  Radiographs dated 03/17/2014   FINDINGS: There is diffuse abnormal signal from the proximal phalanx of the second toe. The middle phalanx is exposed and devitalized with very little signal. The distal phalanx is missing. There is a large soft tissue defect at the stump of the second toe.  There is abnormal subcutaneous edema at the base of the second and third toes particularly along the plantar aspect and extending in the web space between the first and second toes. No discrete abscess. There is no osteomyelitis of the other toes.  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  IMPRESSION: Devitalization of the middle phalanx of the second toe. Abnormal signal from the proximal phalanx of the second toe is most likely due being devitalized. There is no discrete destruction of the proximal phalanx.  Adjacent cellulitis. The distal phalanx of the second toe is missing. Middle phalanx is devitalized.   Electronically Signed   By: Geanie Cooley M.D.   On: 03/18/2014 07:49   Nm Myocar Multi W/spect W/wall Motion / Ef  03/21/2014   CLINICAL DATA:  Chest pain.  Shortness of Breath.  Diabetes.  EXAM: MYOCARDIAL IMAGING WITH SPECT (REST AND PHARMACOLOGIC-STRESS)  GATED LEFT VENTRICULAR WALL MOTION STUDY  LEFT VENTRICULAR EJECTION FRACTION  TECHNIQUE: Standard myocardial SPECT imaging was performed after resting intravenous injection of 10 mCi Tc-26m sestamibi. Subsequently, intravenous infusion of Lexiscan was performed under the supervision of the Cardiology staff. At peak effect of the drug, 30 mCi Tc-6m sestamibi was injected intravenously and standard myocardial SPECT imaging was performed. Quantitative gated imaging was also performed to evaluate left ventricular wall motion, and estimate left ventricular ejection fraction.  COMPARISON:  None.  FINDINGS: Perfusion: Decreased uptake in the inferior wall on both sets of imaging suggesting inferior wall scar. Associated wall motion abnormality. No reversible defects to  suggest ischemia.  Wall Motion: Mild global hypokinesis and inferior wall motion abnormality.  Left Ventricular Ejection Fraction: 35 %  End diastolic volume 116 ml  End systolic volume 75 ml  IMPRESSION: 1. Inferior wall scar.  No ischemia.  2. Mild global hypokinesis and inferior wall motion abnormality.  3. Left ventricular ejection fraction 35%  4. Intermediate-risk stress test findings*.  *2012 Appropriate Use Criteria for Coronary Revascularization Focused Update: J Am Coll Cardiol. 2012;59(9):857-881. http://content.dementiazones.com.aspx?articleid=1201161   Electronically Signed   By: Loralie Champagne M.D.   On: 03/21/2014 15:34   Dg Foot Complete Left  03/17/2014   CLINICAL DATA:  Infection of second  toe of left foot.  EXAM: LEFT FOOT - COMPLETE 3+ VIEW  COMPARISON:  None.  FINDINGS: Advanced vascular calcifications. Mild hallux valgus deformity with osteoarthritis of the first metatarsal phalangeal joint. Soft tissue defect about the distal second digit. No gross osseous destruction identified. Extensive overlap of digits on the lateral view. No soft tissue gas.  IMPRESSION: Suspect soft tissue ulcer about the second digit, without gross osseous destruction. Dependent on clinical concern, consider contrast-enhanced MRI.   Electronically Signed   By: Jeronimo Greaves M.D.   On: 03/17/2014 18:52    ASSESSMENT AND PLAN  1. Left toe gangrene with PVD  - s/p tibial endarterectomy and amputation of second toe)   2. ESRD  - HD per renal TTS  3, Systolic heart failure/newly recognized nonischemic cardiomyopathy (see cath report below) in the context of pre-operative evaluation  - echo this admit LVEF 30-35%, severe inferior hypokinesis. No old echoes to compare, nuclear study from 03/2012 showed LVEF 44% and evidence of inferior scar with mild peri-infarct ischemia. Coronaries were not studied at that time due to CKD, she has since started HD.   - BP tolerating low dose BB (started yesterday)  -  L&RHC 03/24/2014 50-60% mid LAD, diffuse 30-40% prox LAD, 40% ost D1 stenosis, 50-60% ostial OM1 stenosis. CI 2.66. CO 3.51. No obvious culprit lesion to explain inferior wall motion abnormalities and severely reduced EF.    - patient is doing very well from a cardiac standpoint post op vascular surgery.  4. HTN 5. Anemia 6. Dyslipidemia 7. 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 Severe protein calorie malnutrition 9. Diabetes mellitus with the above complications  Will resume Coreg and statin. On ASA. Consider adding Plavix due to CAD and PAD.  Signed, Peter [place] MD,FACC 03/27/2014 7:30 AM

## 2014-03-28 ENCOUNTER — Other Ambulatory Visit (HOSPITAL_COMMUNITY): Payer: Medicare Other

## 2014-03-28 ENCOUNTER — Ambulatory Visit: Payer: Medicare Other | Admitting: Vascular Surgery

## 2014-03-28 ENCOUNTER — Encounter (HOSPITAL_COMMUNITY): Payer: Medicare Other

## 2014-03-28 ENCOUNTER — Telehealth: Payer: Self-pay | Admitting: Vascular Surgery

## 2014-03-28 DIAGNOSIS — I998 Other disorder of circulatory system: Secondary | ICD-10-CM

## 2014-03-28 LAB — BASIC METABOLIC PANEL
ANION GAP: 8 (ref 5–15)
BUN: 12 mg/dL (ref 6–23)
CHLORIDE: 99 mmol/L (ref 96–112)
CO2: 28 mmol/L (ref 19–32)
CREATININE: 2.94 mg/dL — AB (ref 0.50–1.10)
Calcium: 7.8 mg/dL — ABNORMAL LOW (ref 8.4–10.5)
GFR calc Af Amer: 18 mL/min — ABNORMAL LOW (ref >90)
GFR, EST NON AFRICAN AMERICAN: 15 mL/min — AB (ref >90)
Glucose, Bld: 208 mg/dL — ABNORMAL HIGH (ref 70–99)
Potassium: 3.2 mmol/L — ABNORMAL LOW (ref 3.5–5.1)
Sodium: 135 mmol/L (ref 135–145)

## 2014-03-28 LAB — CBC
HEMATOCRIT: 26.2 % — AB (ref 36.0–46.0)
HEMOGLOBIN: 8 g/dL — AB (ref 12.0–15.0)
MCH: 25.5 pg — ABNORMAL LOW (ref 26.0–34.0)
MCHC: 30.5 g/dL (ref 30.0–36.0)
MCV: 83.4 fL (ref 78.0–100.0)
PLATELETS: 260 10*3/uL (ref 150–400)
RBC: 3.14 MIL/uL — ABNORMAL LOW (ref 3.87–5.11)
RDW: 19.2 % — ABNORMAL HIGH (ref 11.5–15.5)
WBC: 13.4 10*3/uL — ABNORMAL HIGH (ref 4.0–10.5)

## 2014-03-28 LAB — GLUCOSE, CAPILLARY
GLUCOSE-CAPILLARY: 110 mg/dL — AB (ref 70–99)
Glucose-Capillary: 223 mg/dL — ABNORMAL HIGH (ref 70–99)
Glucose-Capillary: 241 mg/dL — ABNORMAL HIGH (ref 70–99)

## 2014-03-28 MED ORDER — INSULIN ASPART 100 UNIT/ML ~~LOC~~ SOLN: 0.0000 [IU] | Freq: Three times a day (TID) | SUBCUTANEOUS | Status: DC

## 2014-03-28 MED ORDER — INSULIN GLARGINE 100 UNIT/ML ~~LOC~~ SOLN: 5.0000 [IU] | Freq: Every day | SUBCUTANEOUS | Status: DC

## 2014-03-28 MED ORDER — SACCHAROMYCES BOULARDII 250 MG PO CAPS: 250.0000 mg | ORAL_CAPSULE | Freq: Two times a day (BID) | ORAL | Status: DC

## 2014-03-28 MED ORDER — DOXYCYCLINE HYCLATE 100 MG PO TABS
100.0000 mg | ORAL_TABLET | Freq: Two times a day (BID) | ORAL | Status: AC
Start: 2014-03-28 — End: 2014-04-03

## 2014-03-28 MED ORDER — DOXYCYCLINE HYCLATE 100 MG PO TABS
100.0000 mg | ORAL_TABLET | Freq: Two times a day (BID) | ORAL | Status: DC
  Administered 2014-03-28: 100 mg via ORAL
  Filled 2014-03-28 (×2): qty 1

## 2014-03-28 MED ORDER — INSULIN GLARGINE 100 UNIT/ML ~~LOC~~ SOLN
5.0000 [IU] | Freq: Every day | SUBCUTANEOUS | Status: DC
  Administered 2014-03-28: 5 [IU] via SUBCUTANEOUS
  Filled 2014-03-28: qty 0.05

## 2014-03-28 MED ORDER — DOCUSATE SODIUM 100 MG PO CAPS
100.0000 mg | ORAL_CAPSULE | Freq: Every day | ORAL | Status: DC
Start: 2014-03-28 — End: 2014-06-28

## 2014-03-28 MED ORDER — OXYCODONE-ACETAMINOPHEN 5-325 MG PO TABS: 1.0000 | ORAL_TABLET | Freq: Three times a day (TID) | ORAL | Status: DC | PRN

## 2014-03-28 NOTE — Progress Notes (Signed)
    Subjective:  No chest pain or SOB  Objective:  Vital Signs in the last 24 hours: Temp:  [97.6 F (36.4 C)-98.5 F (36.9 C)] 98.5 F (36.9 C) (02/05 0758) Pulse Rate:  [85-104] 88 (02/05 0758) Resp:  [11-18] 15 (02/05 0758) BP: (86-127)/(37-65) 96/43 mmHg (02/05 0758) SpO2:  [96 %-100 %] 97 % (02/05 0758) Weight:  [85 lb 8.6 oz (38.8 kg)-87 lb 4.8 oz (39.6 kg)] 85 lb 8.6 oz (38.8 kg) (02/05 0540)  Intake/Output from previous day:  Intake/Output Summary (Last 24 hours) at 03/28/14 1000 Last data filed at 03/27/14 1947  Gross per 24 hour  Intake    120 ml  Output   1502 ml  Net  -1382 ml    Physical Exam: General appearance: alert, cooperative, cachectic, no distress and poor dentition Lungs: decreased breath sounds Heart: regular rate and rhythm   Rate: 86  Rhythm: normal sinus rhythm  Lab Results:  Recent Labs  03/27/14 0326 03/28/14 0535  WBC 12.9* 13.4*  HGB 8.2* 8.0*  PLT 227 260    Recent Labs  03/27/14 0326 03/28/14 0535  NA 136 135  K 4.1 3.2*  CL 102 99  CO2 24 28  GLUCOSE 105* 208*  BUN 16 12  CREATININE 3.75* 2.94*   No results for input(s): TROPONINI in the last 72 hours.  Invalid input(s): CK, MB No results for input(s): INR in the last 72 hours.  Imaging: Imaging results have been reviewed  Cardiac Studies:  Assessment/Plan:  69 year old female with a history of Dyslipidemia, recent CVA, peripheral vascular disease, hypertension, diabetes mellitus, and ESRD on HD.She presented with critical limb ischemia. She had an abnormal Myoview during pre op cardiac evaluation. Cath done 03/24/14 showed moderate 3V CAD. Her EF is 30%. She was cleared for PV surgery which was done 03/26/14- (Left popliteal and peroneal artery endarterectomy with vein patch angioplasty, left second toe amputation). She has been stable post op and will go to SNF for rehab.    Principal Problem:   Critical lower limb ischemia Active Problems:   Cellulitis of  foot   End stage renal disease-on HD   Protein-calorie malnutrition, severe   DM type 2 (diabetes mellitus, type 2)   LVF 30%   Moderate 3V CAD at cath 03/24/14   HLD (hyperlipidemia)   Anemia of chronic disease   Neuropathy   PLAN: Current cardiac medications- Coreg, Pravachol, ASA, (B/P is soft). She does not have a cardiologist but was scheduled to see Dr Rennis Golden as an OP (he has not seen her this admission). I will arrange for her to f/u with Dr Herbie Baltimore since he did her cath. We will be available over the weekend for any cardiac issues.   Corine Shelter PA-C [facility] [phone] 03/28/2014, 10:00 AM   I have examined the patient and reviewed assessment and plan and discussed with patient.  Agree with above as stated. Stable from cardiac standpoint. Will sign off.    Eldred Lievanos S.

## 2014-03-28 NOTE — Progress Notes (Signed)
Physical Therapy Evaluation Patient Details Name: Elizabeth [name] MRN: [id number] DOB: 1945/07/17 Today's Date: 03/28/2014   History of Present Illness  Pt admitted with L foot infection with gangrenous changes due to PVD on 1/25. Underwent cardiac cath following stress test that revealed CAD with EF of 35%.  Pt now s/p pop and peroneal arteriogram and second toe amputation.  PMH: ESRD, HTN, neuropathy, DM.  Clinical Impression  Patient presents with problems listed below.  Will benefit from acute PT to maximize independence prior to discharge.  Patient lives alone.  Recommend ST-SNF for continued therapy prior to return home.    Follow Up Recommendations SNF;Supervision/Assistance - 24 hour    Equipment Recommendations  Rolling walker with 5" wheels    Recommendations for Other Services       Precautions / Restrictions Precautions Precautions: Fall Required Braces or Orthoses: Other Brace/Splint Other Brace/Splint: Darco shoe Restrictions Weight Bearing Restrictions: No      Mobility  Bed Mobility Overal bed mobility: Needs Assistance Bed Mobility: Supine to Sit;Sit to Supine     Supine to sit: Supervision Sit to supine: Min assist   General bed mobility comments: Patient uses rail and requires increased time for mobility.  Assist to lift LLE onto bed to return to supine.  Transfers Overall transfer level: Needs assistance Equipment used: Rolling walker (2 wheeled) Transfers: Sit to/from Stand Sit to Stand: Min assist         General transfer comment: Verbal cues for hand placement.  Assist to rise to standing and for balance initially.  Ambulation/Gait Ambulation/Gait assistance: Min assist Ambulation Distance (Feet): 16 Feet Assistive device: Rolling walker (2 wheeled) Gait Pattern/deviations: Step-to pattern;Decreased stance time - left;Decreased step length - right;Decreased step length - left;Decreased weight shift to left Gait velocity: Decreased Gait  velocity interpretation: Below normal speed for age/gender General Gait Details: Verbal cues for safe use of RW.  Instructions for gait sequence with Darco shoe.  Patient using step-to gait pattern.  Short shuffle steps.  Stairs            Wheelchair Mobility    Modified Rankin (Stroke Patients Only)       Balance                                             Pertinent Vitals/Pain Pain Assessment: No/denies pain    Home Living Family/patient expects to be discharged to:: Private residence Living Arrangements: Alone Available Help at Discharge: Family;Available PRN/intermittently Type of Home: Apartment Home Access: Elevator     Home Layout: One level Home Equipment: Grab bars - tub/shower;Wheelchair - manual;Hospital bed      Prior Function Level of Independence: Independent with assistive device(s)         Comments: used w/c or furniture walked around home, goes to HD in w/c     Hand Dominance   Dominant Hand: Right    Extremity/Trunk Assessment   Upper Extremity Assessment: Generalized weakness           Lower Extremity Assessment: Generalized weakness;LLE deficits/detail   LLE Deficits / Details: Post-op weakness.  Foot/ankle banadaged.  Cervical / Trunk Assessment: Normal  Communication   Communication: No difficulties  Cognition Arousal/Alertness: Awake/alert Behavior During Therapy: WFL for tasks assessed/performed Overall Cognitive Status: Within Functional Limits for tasks assessed  General Comments      Exercises        Assessment/Plan    PT Assessment Patient needs continued PT services  PT Diagnosis Difficulty walking;Abnormality of gait;Generalized weakness   PT Problem List Decreased strength;Decreased activity tolerance;Decreased mobility;Decreased balance;Decreased knowledge of use of DME;Decreased skin integrity  PT Treatment Interventions DME instruction;Gait  training;Functional mobility training;Therapeutic activities;Patient/family education   PT Goals (Current goals can be found in the Care Plan section) Acute Rehab PT Goals Patient Stated Goal: to return home PT Goal Formulation: With patient Time For Goal Achievement: 04/04/14 Potential to Achieve Goals: Good    Frequency Min 3X/week   Barriers to discharge Decreased caregiver support Patient lives alone    Co-evaluation               End of Session Equipment Utilized During Treatment: Gait belt (Darco shoe) Activity Tolerance: Patient limited by fatigue Patient left: in bed;with call bell/phone within reach;with bed alarm set Nurse Communication: Mobility status         Time: 9604-5409 PT Time Calculation (min) (ACUTE ONLY): 14 min   Charges:   PT Evaluation $Initial PT Evaluation Tier I: 1 Procedure     PT G CodesVena Austria 04/25/14, 11:45 AM Durenda Hurt. Renaldo Fiddler, [facility] Acute Rehab Services Pager [phone]

## 2014-03-28 NOTE — Progress Notes (Signed)
Medicare Important Message given? YES  (If response is "NO", the following Medicare IM given date fields will be blank)  Date Medicare IM given: 03/28/14 Medicare IM given by:  Soul Deveney  

## 2014-03-28 NOTE — Progress Notes (Signed)
Inpatient Diabetes Program Recommendations  AACE/ADA: New Consensus Statement on Inpatient Glycemic Control (2013)  Target Ranges:  Prepandial:   less than 140 mg/dL      Peak postprandial:   less than 180 mg/dL (1-2 hours)      Critically ill patients:  140 - 180 mg/dL     Results for SONTEE, ROUNDTREE (MRN [id number]) as of 03/28/2014 07:23  Ref. Range 03/27/2014 16:29 03/27/2014 22:03  Glucose-Capillary Latest Range: 70-99 mg/dL 509 (H) 326 (H)    Results for NASH, RUBENACKER (MRN [id number]) as of 03/28/2014 07:23  Ref. Range 03/28/2014 07:00  Glucose-Capillary Latest Range: 70-99 mg/dL 833 (H)     Current Orders: Novolog MODERATE SSI tid ac + HS   **Per Chart Review, patient saw her Endocrinologist (Dr. Lucianne Muss with [facility]) on 03/10/14.  Per Dr. Remus Blake notes patient was instructed to take Lantus 6 units QPM + Novolog at home.  **Note patient's fasting glucose levels are now elevated today.    MD- Please consider the following:  1. Start Lantus 6 units QHS 2. Decrease Novolog SSI to SENSITIVE scale if Lantus added    Will follow Ambrose Finland RN, MSN, CDE Diabetes Coordinator Inpatient Diabetes Program Team Pager: [phone] (8a-10p)

## 2014-03-28 NOTE — Discharge Instructions (Signed)
Wet to dry dressing changes to toe amputation site twice daily and wrap with kerlix.

## 2014-03-28 NOTE — Clinical Social Work Placement (Signed)
Clinical Social Work Department CLINICAL SOCIAL WORK PLACEMENT NOTE 03/28/2014  Patient:  Elizabeth [name],Elizabeth [name]  Account Number:  [id number] Admit date:  03/17/2014  Clinical Social Worker:  Merlyn Lot, CLINICAL SOCIAL WORKER  Date/time:  03/28/2014 12:14 PM  Clinical Social Work is seeking post-discharge placement for this patient at the following level of care:   SKILLED NURSING   (*CSW will update this form in Epic as items are completed)   03/28/2014  Patient/family provided with [facility] Department of Clinical Social Work's list of facilities offering this level of care within the geographic area requested by the patient (or if unable, by the patient's family).  03/28/2014  Patient/family informed of their freedom to choose among providers that offer the needed level of care, that participate in Medicare, Medicaid or managed care program needed by the patient, have an available bed and are willing to accept the patient.  03/28/2014  Patient/family informed of [facility]' ownership interest in [facility], as well as of the fact that they are under no obligation to receive care at this facility.  PASARR submitted to EDS on 03/28/2014 PASARR number received on 03/28/2014  FL2 transmitted to all facilities in geographic area requested by pt/family on  03/28/2014 FL2 transmitted to all facilities within larger geographic area on   Patient informed that his/her managed care company has contracts with or will negotiate with  certain facilities, including the following:     Patient/family informed of bed offers received:   Patient chooses bed at  Physician recommends and patient chooses bed at    Patient to be transferred to  on   Patient to be transferred to facility by  Patient and family notified of transfer on  Name of family member notified:    The following physician request were entered in Epic:   Additional Comments: Merlyn Lot, [facility] Clinical Social  Worker [phone]

## 2014-03-28 NOTE — Discharge Summary (Signed)
Elizabeth [name], 69 y.o., DOB 1945/10/18, MRN [id number]. Admission date: 03/17/2014 Discharge Date 03/28/2014 Primary MD [name], MD Admitting Physician Lynden Oxford, MD   PCP please follow on: - Check CBC within 3 days from discharge. - Patient is a hemodialysis patient, will need to follow renal carb modified diet. - Please follow with wound care as instructed on discharge instructions,Wet to dry dressing changes to toe amputation site twice daily and wrap with kerlix. - Patient has poorly controlled diabetes mellitus, please follow closely and adjust Lantus dose as needed.  Admission Diagnosis  Cellulitis [L03.90] Cellulitis of foot [L03.119] Toe infection [L08.9] Necrotic toes [I96] Black toe [I96]  Discharge Diagnosis   Principal Problem:   Critical lower limb ischemia Active Problems:   HLD (hyperlipidemia)   Anemia of chronic disease   End stage renal disease-on HD   Protein-calorie malnutrition, severe   Cellulitis of foot   Neuropathy   DM type 2 (diabetes mellitus, type 2)   LVF 30%   Moderate 3V CAD at cath 03/24/14   Past Medical History  Diagnosis Date  . Hyperlipidemia   . Urinary retention     DX NEUROGENIC BLADDER--HAS INDWELLING FOLEY CATHETER  . Arthritis   . Anemia   . CAD (coronary artery disease)   . Neuropathic pain   . Stroke     patient not aware.  . Foley catheter in place     for urinary retention  . Critical lower limb ischemia   . Hypertension   . Peripheral vascular disease   . Hypoglycemia 08/19/2013  . Hypokalemia 08/19/2013  . ESRD (end stage renal disease)   . Diabetes mellitus     TAKES INSULIN     Past Surgical History  Procedure Laterality Date  . No previous surgery    . Insertion of suprapubic catheter N/A 05/16/2012    Procedure: INSERTION OF SUPRAPUBIC CATHETER;  Surgeon: Sebastian Ache, MD;  Location: [facility] ORS;  Service: Urology;  Laterality: N/A;  . Bascilic vein transposition Left 10/30/2012    Procedure: LEFT 1ST STAGE  BASCILIC VEIN TRANSPOSITION;  Surgeon: Chuck Hint, MD;  Location: [facility] OR;  Service: Vascular;  Laterality: Left;  . Nm myocar perf wall motion  04/19/2012    low risk study-EF 44%,mild inferolateral hypkinesis  . Bascilic vein transposition Left 02/05/2013    Procedure: BASCILIC VEIN TRANSPOSITION 2ND STAGE;  Surgeon: Chuck Hint, MD;  Location: [facility] OR;  Service: Vascular;  Laterality: Left;  . Radiology with anesthesia N/A 10/25/2013    Procedure: RADIOLOGY WITH ANESTHESIA;  Surgeon: Medication Radiologist, MD;  Location: [facility] OR;  Service: Radiology;  Laterality: N/A;  . Fistulogram Left 04/29/2013    Procedure: FISTULOGRAM;  Surgeon: Chuck Hint, MD;  Location: [facility] CATH LAB;  Service: Cardiovascular;  Laterality: Left;  . Angioplasty Left 04/29/2013    Procedure: ANGIOPLASTY;  Surgeon: Chuck Hint, MD;  Location: [facility] CATH LAB;  Service: Cardiovascular;  Laterality: Left;  . Abdominal aortagram N/A 03/19/2014    Procedure: ABDOMINAL Ronny Flurry;  Surgeon: Sherren Kerns, MD;  Location: [facility] CATH LAB;  Service: Cardiovascular;  Laterality: N/A;  . Left and right heart catheterization with coronary angiogram N/A 03/24/2014    Procedure: LEFT AND RIGHT HEART CATHETERIZATION WITH CORONARY ANGIOGRAM;  Surgeon: Marykay Lex, MD;  Location: [facility] CATH LAB;  Service: Cardiovascular;  Laterality: N/A;  . Endarterectomy popliteal Left 03/26/2014    Procedure: Left popliteal and peroneal artery endarterectomy with vein patch angioplasty;  Surgeon: Sherren Kerns,  MD;  Location: [facility] OR;  Service: Vascular;  Laterality: Left;  . Amputation Left 03/26/2014    Procedure: AMPUTATION SECOND LEFT TOE;  Surgeon: Sherren Kerns, MD;  Location: [facility] OR;  Service: Vascular;  Laterality: Left;     Hospital Course See H&P, Labs, Consult and Test reports for all details in brief, patient was admitted for **  Principal Problem:   Critical lower limb ischemia Active Problems:   HLD  (hyperlipidemia)   Anemia of chronic disease   End stage renal disease-on HD   Protein-calorie malnutrition, severe   Cellulitis of foot   Neuropathy   DM type 2 (diabetes mellitus, type 2)   LVF 30%   Moderate 3V CAD at cath 03/24/14  The patient presented with complaints of infection ofLeft foot toe. She was found to have gangrenous changes. She was subsequently admitted to the hospital. Vascular surgery was consulted. She underwent arteriogram. Plan is for surgical intervention if cleared by cardiology. She underwent stress test on 1/29 , along with echocardiogram. EF was noted to be low in the 30s. Subsequently, she underwent cardiac catheterization. Nonobstructive CAD was noted. Cleared for surgery, had pop and peroneal endarterectomy and 2nd toe amputation on 03/26/14.  Cellulitis of Left foot/Ulcer of Toe of Left Foot -Due to peripheral vascular disease and diabetes. -MRI w/o contrast of left foot showed "Devitalization of the middle phalanx of the second toe. Abnormal signal from the proximal phalanx of the second toe is most likely due being devitalized. There is no discrete destruction of the proximal phalanx. Adjacent cellulitis. The distal phalanx of the second toe is missing." -Bilateral lower extremity venous duplex showed no evidence of DVT, superficial thrombosis, or Baker's cyst. - vancomycin and zosyn stopped on 03/27/14 after surgery. Started on doxycycline 100 mg oral twice a day on 03/28/14,to finish total of 1 week after surgery. - Blood cultures negative so far. -Patient underwent arteriogram 1/27.  -s/p pop and peroneal endarterectomy and 2nd toe amputation on 03/26/14 - Continue with wound care as instructed, will follow with vascular surgery in 2 weeks.  Chronic systolic dysfunction  Seen on stress test and echocardiogram. Cardiology is following. She is well compensated clinically possibly because of dialysis. - Will follow with Dr. Herbie Baltimore after discharge as per cardiology  recommendation.  Diarrhea Appears to have resolved. C. difficile is negative. Can give Imodium if she has recurrence. Probiotics  DM Type 2, uncontrolled Continue NovoLOG on sliding scale. Started on Lantus 5 units daily.  End stage renal disease TTS/Hyponatremia -Renal failure likely due to diabetes -Patient is on dialysis TTS.  Hyperlipidemia -Continue pravastatin.   History of essential Hypertension with episodes of hypotension Blood pressure remains stable.   Anemia of Chronic disease -Hgb remains stable.  Severe protein calorie malnutrition Continue with protein supplements.  Consults      Vascular surgery  Nephrology  Cardiology  Procedures: Arteriogram 03/19/2014 Operative findings: #1 occlusion anterior tibial and posterior tibial arteries bilaterally   #2 90% stenosis origin left peroneal artery   #3 one vessel runoff right peroneal with in-line flow  Nuclear stress test 1/29 IMPRESSION: 1. Inferior wall scar. No ischemia. 2. Mild global hypokinesis and inferior wall motion abnormality. 3. Left ventricular ejection fraction 35% 4. Intermediate-risk stress test findings*.  2D ECHO Study Conclusions - Left ventricle: The cavity size was normal. Wall thickness wasincreased in a pattern of mild LVH. Systolic function wasmoderately to severely reduced. The estimated ejection fractionwas in the range of 30% to 35%. Severe  hypokinesis of theinferior myocardium. - Mitral valve: There was mild regurgitation. - Left atrium: The atrium was mildly dilated. - Pulmonary arteries: PA peak pressure: 31 mm Hg (S). - Pericardium, extracardiac: A trivial pericardial effusion wasidentified. There was a left pleural effusion.  Cardiac catheterization 2/1  Left popliteal and peroneal artery endarterectomy with vein patch angioplasty, left second toe amputation 2/3  Antibiotics:  Vancomycin 1/25>>  2/4  Zosyn 1/25>> 2/4  Doxycycline 2/5 >> 2/11   Significant Tests:  See full reports for all details    Mr Foot Left Wo Contrast  03/18/2014   CLINICAL DATA:  Cellulitis of the left foot.  EXAM: MRI OF THE LEFT FOREFOOT WITHOUT CONTRAST  TECHNIQUE: Multiplanar, multisequence MR imaging was performed. No intravenous contrast was administered.  COMPARISON:  Radiographs dated 03/17/2014  FINDINGS: There is diffuse abnormal signal from the proximal phalanx of the second toe. The middle phalanx is exposed and devitalized with very little signal. The distal phalanx is missing. There is a large soft tissue defect at the stump of the second toe.  There is abnormal subcutaneous edema at the base of the second and third toes particularly along the plantar aspect and extending in the web space between the first and second toes. No discrete abscess. There is no osteomyelitis of the other toes.  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  IMPRESSION: Devitalization of the middle phalanx of the second toe. Abnormal signal from the proximal phalanx of the second toe is most likely due being devitalized. There is no discrete destruction of the proximal phalanx.  Adjacent cellulitis. The distal phalanx of the second toe is missing. Middle phalanx is devitalized.   Electronically Signed   By: Geanie Cooley M.D.   On: 03/18/2014 07:49   Nm Myocar Multi W/spect W/wall Motion / Ef  03/21/2014   CLINICAL DATA:  Chest pain.  Shortness of Breath.  Diabetes.  EXAM: MYOCARDIAL IMAGING WITH SPECT (REST AND PHARMACOLOGIC-STRESS)  GATED LEFT VENTRICULAR WALL MOTION STUDY  LEFT VENTRICULAR EJECTION FRACTION  TECHNIQUE: Standard myocardial SPECT imaging was performed after resting intravenous injection of 10 mCi Tc-9m sestamibi. Subsequently, intravenous infusion of Lexiscan was performed under the supervision of the Cardiology staff. At peak effect of the drug, 30 mCi Tc-81m sestamibi was injected  intravenously and standard myocardial SPECT imaging was performed. Quantitative gated imaging was also performed to evaluate left ventricular wall motion, and estimate left ventricular ejection fraction.  COMPARISON:  None.  FINDINGS: Perfusion: Decreased uptake in the inferior wall on both sets of imaging suggesting inferior wall scar. Associated wall motion abnormality. No reversible defects to suggest ischemia.  Wall Motion: Mild global hypokinesis and inferior wall motion abnormality.  Left Ventricular Ejection Fraction: 35 %  End diastolic volume 116 ml  End systolic volume 75 ml  IMPRESSION: 1. Inferior wall scar.  No ischemia.  2. Mild global hypokinesis and inferior wall motion abnormality.  3. Left ventricular ejection fraction 35%  4. Intermediate-risk stress test findings*.  *2012 Appropriate Use Criteria for Coronary Revascularization Focused Update: J Am Coll Cardiol. 2012;59(9):857-881. http://content.dementiazones.com.aspx?articleid=1201161   Electronically Signed   By: Loralie Champagne M.D.   On: 03/21/2014 15:34   Dg Foot Complete Left  03/17/2014   CLINICAL DATA:  Infection of second toe of left foot.  EXAM: LEFT FOOT - COMPLETE 3+ VIEW  COMPARISON:  None.  FINDINGS: Advanced vascular calcifications. Mild hallux valgus deformity with osteoarthritis of the first metatarsal phalangeal joint. Soft tissue defect about  the distal second digit. No gross osseous destruction identified. Extensive overlap of digits on the lateral view. No soft tissue gas.  IMPRESSION: Suspect soft tissue ulcer about the second digit, without gross osseous destruction. Dependent on clinical concern, consider contrast-enhanced MRI.   Electronically Signed   By: Jeronimo Greaves M.D.   On: 03/17/2014 18:52     Today   Subjective:   Elizabeth [name] today has no headache,no chest abdominal pain,no new weakness tingling or numbness, feels much better TODAY.  Objective:   Blood pressure 92/40, pulse 82, temperature  98.2 F (36.8 C), temperature source Oral, resp. rate 15, height  (1.6 m), weight 38.8 kg (85 lb 8.6 oz), SpO2 96 %.  Intake/Output Summary (Last 24 hours) at 03/28/14 1445 Last data filed at 03/27/14 1947  Gross per 24 hour  Intake    120 ml  Output      2 ml  Net    118 ml    Exam  General: Alert and awake, oriented x3, not in any acute distress, CVS: S1-S2 normal, regular. No rubs, or bruit. Chest: clear to auscultation bilaterally, no wheezing, rales or rhonchi;  Abdomen: soft, nontender, nondistended, quiet bowel sounds, scar in hypogastric region (pt says from suprapubic catheter) Extremities: Left foot is bandaged at the surgical site, foot warm, Data Review   Cultures -   CBC w Diff: Lab Results  Component Value Date   WBC 13.4* 03/28/2014   HGB 8.0* 03/28/2014   HCT 26.2* 03/28/2014   PLT 260 03/28/2014   LYMPHOPCT 5* 03/17/2014   MONOPCT 5 03/17/2014   EOSPCT 0 03/17/2014   BASOPCT 0 03/17/2014   CMP: Lab Results  Component Value Date   NA 135 03/28/2014   K 3.2* 03/28/2014   CL 99 03/28/2014   CO2 28 03/28/2014   BUN 12 03/28/2014   CREATININE 2.94* 03/28/2014   PROT 6.1 03/18/2014   ALBUMIN 1.5* 03/26/2014   BILITOT 0.5 03/18/2014   ALKPHOS 113 03/18/2014   AST 21 03/18/2014   ALT 35 03/18/2014  .  Micro Results Recent Results (from the past 240 hour(s))  MRSA PCR Screening     Status: None   Collection Time: 03/21/14  7:23 AM  Result Value Ref Range Status   MRSA by PCR NEGATIVE NEGATIVE Final    Comment:        The GeneXpert MRSA Assay (FDA approved for NASAL specimens only), is one component of a comprehensive MRSA colonization surveillance program. It is not intended to diagnose MRSA infection nor to guide or monitor treatment for MRSA infections.   Clostridium Difficile by PCR     Status: None   Collection Time: 03/21/14  8:43 PM  Result Value Ref Range Status   C difficile by pcr NEGATIVE NEGATIVE Final     Discharge  Instructions     -Continue with wound care twice daily -Low carb modified diet -Full code - Dialysis on Tuesday, Thursday, Saturday, Nexium on dialysis day is tomorrow.  Follow-up Information    Follow up with Sherren Kerns, MD In 2 weeks.   Specialty:  Vascular Surgery   Why:  Office will call you to arrange your appt (sent)   Contact information:   [address] [place] [address] [phone]       Follow up with [facility], MD. Schedule an appointment as soon as possible for a visit in 1 week.   Specialty:  Endocrinology   Contact information:   [address] [place] [address] [phone]       Discharge Medications     Medication List    TAKE these medications        aspirin EC 81 MG tablet  Take 81 mg by mouth daily.     carvedilol 6.25 MG tablet  Commonly known as:  COREG  Take 6.25 mg by mouth 2 (two) times daily with a meal.     cyproheptadine 4 MG tablet  Commonly known as:  PERIACTIN  Take 4 mg by mouth daily.     docusate sodium 100 MG capsule  Commonly known as:  COLACE  Take 1 capsule (100 mg total) by mouth daily.     doxycycline 100 MG tablet  Commonly known as:  VIBRA-TABS  Take 1 tablet (100 mg total) by mouth every 12 (twelve) hours.     feeding supplement (NEPRO CARB STEADY) Liqd  Take 237 mLs by mouth as needed (missed meal during dialysis.).     feeding supplement (NEPRO CARB STEADY) Liqd  Take 237 mLs by mouth 3 (three) times daily between meals.     glucose blood test strip  Commonly known as:  ONETOUCH VERIO  Use as instructed to check blood sugar 4 times per day dx code E13.10     insulin aspart 100 UNIT/ML injection  Commonly known as:  novoLOG  Inject 0-15 Units into the skin 3 (three) times daily with meals.     insulin glargine 100 UNIT/ML injection  Commonly known as:  LANTUS  Inject 0.05 mLs (5 Units total) into the skin daily.     multivitamin Tabs tablet  Take 1 tablet by mouth daily.      ONETOUCH DELICA LANCETS FINE Misc  Use to check blood sugar 4 times per day     oxyCODONE-acetaminophen 5-325 MG per tablet  Commonly known as:  PERCOCET/ROXICET  Take 1 tablet by mouth every 8 (eight) hours as needed for moderate pain or severe pain.     pravastatin 20 MG tablet  Commonly known as:  PRAVACHOL  Take 20 mg by mouth daily.     promethazine 12.5 MG tablet  Commonly known as:  PHENERGAN  Take 1 tablet (12.5 mg total) by mouth every 6 (six) hours as needed for nausea or vomiting.     saccharomyces boulardii 250 MG capsule  Commonly known as:  FLORASTOR  Take 1 capsule (250 mg total) by mouth 2 (two) times daily.     sevelamer carbonate 800 MG tablet  Commonly known as:  RENVELA  Take 1 tablet (800 mg total) by mouth 3 (three) times daily with meals.         Total Time in preparing paper work, data evaluation and todays exam - 35 minutes  King Pinzon M.D on 03/28/2014 at 2:45 PM  [facility] Office  [phone]

## 2014-03-28 NOTE — Progress Notes (Signed)
03/28/2014 4:09 PM Report called for [employer]. Questions and concerns addressed.  Tavaria Mackins, Blanchard Kelch

## 2014-03-28 NOTE — Clinical Social Work Note (Signed)
Patient is agreeable to SNF placement- patient has been at [facility] in the past and is agreeable to returning there.  CSW faxed information to [facility]- bed offer pending  Full assessment to follow.  Merlyn Lot, LCSWA Clinical Social Worker [phone]

## 2014-03-28 NOTE — Clinical Social Work Psychosocial (Signed)
Clinical Social Work Department BRIEF PSYCHOSOCIAL ASSESSMENT 03/28/2014  Patient:  Elizabeth [name],Elizabeth [name]     Account Number:  [id number]     Admit date:  03/17/2014  Clinical Social Worker:  Merlyn Lot, CLINICAL SOCIAL WORKER  Date/Time:  03/28/2014 12:11 PM  Referred by:  Physician  Date Referred:  03/28/2014 Referred for  SNF Placement   Other Referral:   Interview type:  Patient Other interview type:    PSYCHOSOCIAL DATA Living Status:  ALONE Admitted from facility:   Level of care:   Primary support name:  Donny Pique Primary support relationship to patient:  CHILD, ADULT Degree of support available:   High level of support according to patient    CURRENT CONCERNS Current Concerns  Post-Acute Placement   Other Concerns:    SOCIAL WORK ASSESSMENT / PLAN CSW spoke with patient concerning SNF placemnet-  Patient is agreeable to SNF and states that she has been to [facility] in the past and would be willing to return there.   Assessment/plan status:  Psychosocial Support/Ongoing Assessment of Needs Other assessment/ plan:   FL2 update   Information/referral to community resources:    PATIENT'S/FAMILY'S RESPONSE TO PLAN OF CARE: Patient is agreeable to SNF placement for short term rehab but is hopeful that she would be able to return home soon- patient values her independence and is concerning about being unable to return home.       Merlyn Lot, LCSWA Clinical Social Worker [phone]

## 2014-03-28 NOTE — Progress Notes (Signed)
03/28/2014 6:03 PM Nursing note Pt. Daughter and granddaughter contacted and ensured that they were aware of pt. D/c SNF this evening. Verbalized understanding. SW to arrange for EMS transport. Will continue to monitor patient.  Marianne Golightly, Blanchard Kelch

## 2014-03-28 NOTE — Progress Notes (Signed)
Problem/Plan:  1. L foot ischemia / early gangrene -s/p pop and peroneal endarterectomy and 2nd toe amputation 2. ESRD - TTS HD, next HD Saturday   Subjective: Interval History: Stable post op  Objective: Vital signs in last 24 hours: Temp:  [97.6 F (36.4 C)-98.5 F (36.9 C)] 98.5 F (36.9 C) (02/05 0758) Pulse Rate:  [83-104] 83 (02/05 1005) Resp:  [11-18] 15 (02/05 0758) BP: (86-127)/(37-65) 105/49 mmHg (02/05 1005) SpO2:  [96 %-100 %] 97 % (02/05 0758) Weight:  [38.8 kg (85 lb 8.6 oz)-39.6 kg (87 lb 4.8 oz)] 38.8 kg (85 lb 8.6 oz) (02/05 0540) Weight change: 3.4 kg (7 lb 7.9 oz)  Intake/Output from previous day: 02/04 0701 - 02/05 0700 In: 120 [P.O.:120] Out: 1502 [Stool:2] Intake/Output this shift:    General appearance: alert, cooperative and thin Resp: clear to auscultation bilaterally Chest wall: no tenderness Cardio: regular rate and rhythm, S1, S2 normal, no murmur, click, rub or gallop Extremities: extremities normal, atraumatic, no cyanosis or edema  Lab Results:  Recent Labs  03/27/14 0326 03/28/14 0535  WBC 12.9* 13.4*  HGB 8.2* 8.0*  HCT 26.8* 26.2*  PLT 227 260   BMET:  Recent Labs  03/27/14 0326 03/28/14 0535  NA 136 135  K 4.1 3.2*  CL 102 99  CO2 24 28  GLUCOSE 105* 208*  BUN 16 12  CREATININE 3.75* 2.94*  CALCIUM 7.6* 7.8*   No results for input(s): PTH in the last 72 hours. Iron Studies: No results for input(s): IRON, TIBC, TRANSFERRIN, FERRITIN in the last 72 hours. Studies/Results: No results found.  Scheduled: . aspirin EC  81 mg Oral Daily  . carvedilol  6.25 mg Oral BID WC  . darbepoetin (ARANESP) injection - DIALYSIS  200 mcg Intravenous Q Tue-HD  . docusate sodium  100 mg Oral Daily  . doxycycline  100 mg Oral Q12H  . feeding supplement (NEPRO CARB STEADY)  237 mL Oral TID WC  . feeding supplement (RESOURCE BREEZE)  1 Container Oral TID WC  . heparin  5,000 Units Subcutaneous 3 times per  day  . insulin aspart  0-15 Units Subcutaneous TID WC  . insulin aspart  0-5 Units Subcutaneous QHS  . pantoprazole  40 mg Oral Daily  . pravastatin  20 mg Oral q1800  . saccharomyces boulardii  250 mg Oral BID  . sevelamer carbonate  800 mg Oral TID WC     LOS: 11 days   Binh Doten C 03/28/2014,10:09 AM

## 2014-03-28 NOTE — Progress Notes (Signed)
[facility]  Subjective  - feels ok   Objective 105/49 83 98.5 F (36.9 C) (Oral) 15 97%  Intake/Output Summary (Last 24 hours) at 03/28/14 1318 Last data filed at 03/27/14 1947  Gross per 24 hour  Intake    120 ml  Output      2 ml  Net    118 ml   Left foot warm incision healing Some granulation toe amp site  Assessment/Planning: Will give a few weeks if no significant healing will need BKA Ok for d/c from my standpoint Follow up 2 weeks  Colbe Viviano E 03/28/2014 1:18 PM --  Laboratory Lab Results:  Recent Labs  03/27/14 0326 03/28/14 0535  WBC 12.9* 13.4*  HGB 8.2* 8.0*  HCT 26.8* 26.2*  PLT 227 260   BMET  Recent Labs  03/27/14 0326 03/28/14 0535  NA 136 135  K 4.1 3.2*  CL 102 99  CO2 24 28  GLUCOSE 105* 208*  BUN 16 12  CREATININE 3.75* 2.94*  CALCIUM 7.6* 7.8*    COAG Lab Results  Component Value Date   INR 1.21 03/24/2014   INR 1.07 03/18/2014   No results found for: PTT

## 2014-03-28 NOTE — Progress Notes (Signed)
Orthopedic Tech Progress Note Patient Details:  Elizabeth [name] 03-Jan-1946 [id number]  Ortho Devices Type of Ortho Device: Darco shoe Ortho Device/Splint Location: LLE Ortho Device/Splint Interventions: Application   Asia R Thompson 03/28/2014, 11:20 AM

## 2014-03-28 NOTE — Telephone Encounter (Addendum)
-----   Message from Dara Lords, [place] sent at 03/28/2014 12:07 PM EST ----- S/p Left popliteal and peroneal artery endarterectomy with vein patch angioplasty, left second toe amputation  F/u with [facility] in 2 weeks.  Thanks   03/28/14: spoke with pt, dpm

## 2014-03-31 ENCOUNTER — Ambulatory Visit: Payer: Medicare Other | Admitting: Endocrinology

## 2014-04-01 ENCOUNTER — Encounter: Payer: Self-pay | Admitting: Internal Medicine

## 2014-04-01 ENCOUNTER — Non-Acute Institutional Stay (SKILLED_NURSING_FACILITY): Payer: Medicare Other | Admitting: Internal Medicine

## 2014-04-01 ENCOUNTER — Telehealth: Payer: Self-pay | Admitting: Cardiology

## 2014-04-01 ENCOUNTER — Other Ambulatory Visit: Payer: Self-pay

## 2014-04-01 DIAGNOSIS — E11621 Type 2 diabetes mellitus with foot ulcer: Secondary | ICD-10-CM

## 2014-04-01 DIAGNOSIS — R339 Retention of urine, unspecified: Secondary | ICD-10-CM | POA: Diagnosis not present

## 2014-04-01 DIAGNOSIS — I10 Essential (primary) hypertension: Secondary | ICD-10-CM | POA: Diagnosis not present

## 2014-04-01 DIAGNOSIS — I998 Other disorder of circulatory system: Secondary | ICD-10-CM | POA: Diagnosis not present

## 2014-04-01 DIAGNOSIS — L97509 Non-pressure chronic ulcer of other part of unspecified foot with unspecified severity: Secondary | ICD-10-CM | POA: Diagnosis not present

## 2014-04-01 DIAGNOSIS — Z8669 Personal history of other diseases of the nervous system and sense organs: Secondary | ICD-10-CM

## 2014-04-01 DIAGNOSIS — L03119 Cellulitis of unspecified part of limb: Secondary | ICD-10-CM | POA: Diagnosis not present

## 2014-04-01 DIAGNOSIS — N186 End stage renal disease: Secondary | ICD-10-CM | POA: Diagnosis not present

## 2014-04-01 DIAGNOSIS — E43 Unspecified severe protein-calorie malnutrition: Secondary | ICD-10-CM

## 2014-04-01 DIAGNOSIS — I519 Heart disease, unspecified: Secondary | ICD-10-CM | POA: Diagnosis not present

## 2014-04-01 DIAGNOSIS — I70229 Atherosclerosis of native arteries of extremities with rest pain, unspecified extremity: Secondary | ICD-10-CM

## 2014-04-01 DIAGNOSIS — Z8673 Personal history of transient ischemic attack (TIA), and cerebral infarction without residual deficits: Secondary | ICD-10-CM

## 2014-04-01 NOTE — Progress Notes (Signed)
Patient ID: Elizabeth [name], female   DOB: [birth year], 69 y.o.   MRN: [id number]    HISTORY AND PHYSICAL  Location:  [place]    Place of Service: SNF [phone])   Extended Emergency Contact Information Primary Emergency Contact: Gunnar Bulla, [place] Phone: [phone] Mobile Phone: [phone] Relation: Grandaughter Secondary Emergency Contact: Anderson,Wilson April Address: [address] [place], [place] [address] [place] Work Phone: [phone] Mobile Phone: [phone] Relation: Daughter  Advanced Directive information   Arnold  Chief Complaint  Patient presents with  . 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 She has a left forearm AVF used for HD. She reports pain is controlled with percocet. She has appt with diabetic educator at her endocrinologist's office this week. She reports feeling ok  Overall, no CP, SOB, palpitations, HA, dizziness, N/V, abdominal pain, f/c. No d/c from amputation site  Past Medical History  Diagnosis Date  . Hyperlipidemia   . Urinary retention     DX NEUROGENIC BLADDER--HAS INDWELLING FOLEY CATHETER  . Arthritis   . Anemia   . CAD (coronary artery disease)   . Neuropathic pain   . Stroke     patient not aware.  . Foley catheter in place     for urinary retention  . Critical lower limb ischemia   . Hypertension   . Peripheral vascular disease   . Hypoglycemia 08/19/2013  . Hypokalemia 08/19/2013  . ESRD (end stage renal disease)   . Diabetes mellitus     TAKES INSULIN     Past Surgical History  Procedure  Laterality Date  . No previous surgery    . Insertion of suprapubic catheter N/A 05/16/2012    Procedure: INSERTION OF SUPRAPUBIC CATHETER;  Surgeon: Alexis Frock, MD;  Location: [facility];  Service: Urology;  Laterality: N/A;  . Bascilic vein transposition Left 10/30/2012    Procedure: LEFT 1ST STAGE BASCILIC VEIN TRANSPOSITION;  Surgeon: Angelia Mould, MD;  Location: [facility] OR;  Service: Vascular;  Laterality: Left;  . Nm myocar perf wall motion  04/19/2012    low risk study-EF 44%,mild inferolateral hypkinesis  . Bascilic vein transposition Left 02/05/2013    Procedure: BASCILIC VEIN TRANSPOSITION 2ND STAGE;  Surgeon: Angelia Mould, MD;  Location: [facility];  Service: Vascular;  Laterality: Left;  . Radiology with anesthesia N/A 10/25/2013    Procedure: RADIOLOGY WITH ANESTHESIA;  Surgeon: Medication Radiologist, MD;  Location: [facility];  Service: Radiology;  Laterality: N/A;  . Fistulogram Left 04/29/2013    Procedure: FISTULOGRAM;  Surgeon: Angelia Mould, MD;  Location: [facility] CATH LAB;  Service: Cardiovascular;  Laterality: Left;  . Angioplasty Left 04/29/2013    Procedure: ANGIOPLASTY;  Surgeon: Angelia Mould, MD;  Location: [facility] CATH LAB;  Service: Cardiovascular;  Laterality: Left;  . Abdominal aortagram N/A 03/19/2014    Procedure: ABDOMINAL Maxcine Ham;  Surgeon: Elam Dutch, MD;  Location: [facility] CATH LAB;  Service: Cardiovascular;  Laterality: N/A;  . Left  and right heart catheterization with coronary angiogram N/A 03/24/2014    Procedure: LEFT AND RIGHT HEART CATHETERIZATION WITH CORONARY ANGIOGRAM;  Surgeon: Leonie Man, MD;  Location: [facility] CATH LAB;  Service: Cardiovascular;  Laterality: N/A;  . Endarterectomy popliteal Left 03/26/2014    Procedure: Left popliteal and peroneal artery endarterectomy with vein patch angioplasty;  Surgeon: Elam Dutch, MD;  Location: [facility] OR;  Service: Vascular;  Laterality: Left;  . Amputation Left 03/26/2014    Procedure: AMPUTATION SECOND  LEFT TOE;  Surgeon: Elam Dutch, MD;  Location: [place];  Service: Vascular;  Laterality: Left;    Patient Care Team: Elayne Snare, MD as PCP - General (Endocrinology) Windy Kalata, MD as Consulting Physician (Nephrology)  History   Social History  . Marital Status: Single    Spouse Name: N/A  . Number of Children: N/A  . Years of Education: N/A   Occupational History  . Not on file.   Social History Main Topics  . Smoking status: Never Smoker   . Smokeless tobacco: Never Used  . Alcohol Use: No  . Drug Use: No  . Sexual Activity: No   Other Topics Concern  . Not on file   Social History Narrative     reports that she has never smoked. She has never used smokeless tobacco. She reports that she does not drink alcohol or use illicit drugs.  Family History  Problem Relation Age of Onset  . Hyperlipidemia Mother   . Hypertension Mother   . Hodgkin's lymphoma Father   . Heart disease Sister    Family Status  Relation Status Death Age  . Mother Deceased 74  . Father Deceased 42  . Sister Deceased   . Child Alive   . Child Alive     Immunization History  Administered Date(s) Administered  . Influenza-Unspecified 11/21/2012, 12/22/2013  . Pneumococcal Polysaccharide-23 10/21/2013   Past medical, surgical, family and social history reviewed  No Known Allergies  Medications: Patient's Medications  New Prescriptions   No medications on file  Previous Medications   ASPIRIN EC 81 MG TABLET    Take 81 mg by mouth daily.   CARVEDILOL (COREG) 6.25 MG TABLET    Take 6.25 mg by mouth 2 (two) times daily with a meal.   CYPROHEPTADINE (PERIACTIN) 4 MG TABLET    Take 4 mg by mouth daily.   DOCUSATE SODIUM (COLACE) 100 MG CAPSULE    Take 1 capsule (100 mg total) by mouth daily.   GLUCOSE BLOOD (ONETOUCH VERIO) TEST STRIP    Use as instructed to check blood sugar 4 times per day dx code E13.10   INSULIN ASPART (NOVOLOG) 100 UNIT/ML INJECTION    Inject 0-15 Units  into the skin 3 (three) times daily with meals.   INSULIN GLARGINE (LANTUS) 100 UNIT/ML INJECTION    Inject 0.05 mLs (5 Units total) into the skin daily.   MULTIVITAMIN (RENA-VIT) TABS TABLET    Take 1 tablet by mouth daily.   NUTRITIONAL SUPPLEMENTS (FEEDING SUPPLEMENT, NEPRO CARB STEADY,) LIQD    Take 237 mLs by mouth as needed (missed meal during dialysis.).   NUTRITIONAL SUPPLEMENTS (FEEDING SUPPLEMENT, NEPRO CARB STEADY,) LIQD    Take 237 mLs by mouth 3 (three) times daily between meals.   ONETOUCH DELICA LANCETS FINE MISC    Use to check blood sugar 4 times per day   OXYCODONE-ACETAMINOPHEN (PERCOCET/ROXICET) 5-325 MG PER TABLET    Take 1 tablet by mouth every 8 (eight)  hours as needed for moderate pain or severe pain.   PRAVASTATIN (PRAVACHOL) 20 MG TABLET    Take 20 mg by mouth daily.   PROMETHAZINE (PHENERGAN) 12.5 MG TABLET    Take 1 tablet (12.5 mg total) by mouth every 6 (six) hours as needed for nausea or vomiting.   SACCHAROMYCES BOULARDII (FLORASTOR) 250 MG CAPSULE    Take 1 capsule (250 mg total) by mouth 2 (two) times daily.   SEVELAMER CARBONATE (RENVELA) 800 MG TABLET    Take 1 tablet (800 mg total) by mouth 3 (three) times daily with meals.  Modified Medications   No medications on file  Discontinued Medications   No medications on file    Review of Systems  As above. All other systems reviewed are negative.  Filed Vitals:   03/31/14 1612  BP: 108/61  Pulse: 80  Temp: 98.6 F (37 C)  SpO2: 97%   There is no weight on file to calculate BMI.  Physical Exam CONSTITUTIONAL: Looks frail in NAD. Awake, alert and oriented x 3 HEENT: PERRLA. Oropharynx clear and without exudate. MMM NECK: Supple. Nontender. No palpable cervical or supraclavicular lymph nodes. No carotid bruit b/l. No thyromegaly or thyroid mass palpable.  CVS: Regular rate with 1/6 SEmurmur. no gallop or rub. LUNGS: CTA b/l no wheezing, rales or rhonchi. ABDOMEN: Bowel sounds present x 4. Soft,  nontender, nondistended. No palpable mass or bruit EXTREMITIES: No edema b/l. Distal pulses palpable. No calf tenderness. Left foot wrapped with soft boot intact PSYCH: Affect, behavior and mood normal IR:JJOAC intact and with dark colored urine DTG   Labs reviewed: Admission on 03/17/2014, Discharged on 03/28/2014  No results displayed because visit has over 200 results.    Office Visit on 03/10/2014  Component Date Value Ref Range Status  . POC Glucose 03/10/2014 236* 70 - 99 mg/dl Final  Admission on 02/18/2014, Discharged on 02/18/2014  Component Date Value Ref Range Status  . WBC 02/18/2014 6.4  4.0 - 10.5 K/uL Final  . RBC 02/18/2014 5.11  3.87 - 5.11 MIL/uL Final  . Hemoglobin 02/18/2014 13.4  12.0 - 15.0 g/dL Final  . HCT 02/18/2014 42.4  36.0 - 46.0 % Final  . MCV 02/18/2014 83.0  78.0 - 100.0 fL Final  . MCH 02/18/2014 26.2  26.0 - 34.0 pg Final  . MCHC 02/18/2014 31.6  30.0 - 36.0 g/dL Final  . RDW 02/18/2014 18.0* 11.5 - 15.5 % Final  . Platelets 02/18/2014 143* 150 - 400 K/uL Final   Comment: SPECIMEN CHECKED FOR CLOTS REPEATED TO VERIFY DELTA CHECK NOTED   . Neutrophils Relative % 02/18/2014 78* 43 - 77 % Final  . Neutro Abs 02/18/2014 5.0  1.7 - 7.7 K/uL Final  . Lymphocytes Relative 02/18/2014 13  12 - 46 % Final  . Lymphs Abs 02/18/2014 0.8  0.7 - 4.0 K/uL Final  . Monocytes Relative 02/18/2014 8  3 - 12 % Final  . Monocytes Absolute 02/18/2014 0.5  0.1 - 1.0 K/uL Final  . Eosinophils Relative 02/18/2014 1  0 - 5 % Final  . Eosinophils Absolute 02/18/2014 0.1  0.0 - 0.7 K/uL Final  . Basophils Relative 02/18/2014 1  0 - 1 % Final  . Basophils Absolute 02/18/2014 0.0  0.0 - 0.1 K/uL Final  . Sodium 02/18/2014 139  135 - 145 mmol/L Final   Please note change in reference range.  . Potassium 02/18/2014 3.9  3.5 - 5.1 mmol/L Final   Please note change in  reference range.  . Chloride 02/18/2014 102  96 - 112 mEq/L Final  . CO2 02/18/2014 26  19 - 32 mmol/L Final   . Glucose, Bld 02/18/2014 145* 70 - 99 mg/dL Final  . BUN 02/18/2014 16  6 - 23 mg/dL Final  . Creatinine, Ser 02/18/2014 1.91* 0.50 - 1.10 mg/dL Final  . Calcium 02/18/2014 8.2* 8.4 - 10.5 mg/dL Final  . Total Protein 02/18/2014 6.4  6.0 - 8.3 g/dL Final  . Albumin 02/18/2014 2.8* 3.5 - 5.2 g/dL Final  . AST 02/18/2014 35  0 - 37 U/L Final  . ALT 02/18/2014 27  0 - 35 U/L Final  . Alkaline Phosphatase 02/18/2014 94  39 - 117 U/L Final  . Total Bilirubin 02/18/2014 0.5  0.3 - 1.2 mg/dL Final  . GFR calc non Af Amer 02/18/2014 26* >90 mL/min Final  . GFR calc Af Amer 02/18/2014 30* >90 mL/min Final   Comment: (NOTE) The eGFR has been calculated using the CKD EPI equation. This calculation has not been validated in all clinical situations. eGFR's persistently <90 mL/min signify possible Chronic Kidney Disease.   . Anion gap 02/18/2014 11  5 - 15 Final    Mr Foot Left Wo Contrast  03/18/2014   CLINICAL DATA:  Cellulitis of the left foot.  EXAM: MRI OF THE LEFT FOREFOOT WITHOUT CONTRAST  TECHNIQUE: Multiplanar, multisequence MR imaging was performed. No intravenous contrast was administered.  COMPARISON:  Radiographs dated 03/17/2014  FINDINGS: There is diffuse abnormal signal from the proximal phalanx of the second toe. The middle phalanx is exposed and devitalized with very little signal. The distal phalanx is missing. There is a large soft tissue defect at the stump of the second toe.  There is abnormal subcutaneous edema at the base of the second and third toes particularly along the plantar aspect and extending in the web space between the first and second toes. No discrete abscess. There is no osteomyelitis of the other toes.  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  IMPRESSION: Devitalization of the middle phalanx of the second toe. Abnormal signal from the proximal phalanx of the second toe is most likely due being devitalized. There is no discrete  destruction of the proximal phalanx.  Adjacent cellulitis. The distal phalanx of the second toe is missing. Middle phalanx is devitalized.   Electronically Signed   By: Rozetta Nunnery M.D.   On: 03/18/2014 07:49   Nm Myocar Multi W/spect W/wall Motion / Ef  03/21/2014   CLINICAL DATA:  Chest pain.  Shortness of Breath.  Diabetes.  EXAM: MYOCARDIAL IMAGING WITH SPECT (REST AND PHARMACOLOGIC-STRESS)  GATED LEFT VENTRICULAR WALL MOTION STUDY  LEFT VENTRICULAR EJECTION FRACTION  TECHNIQUE: Standard myocardial SPECT imaging was performed after resting intravenous injection of 10 mCi Tc-49msestamibi. Subsequently, intravenous infusion of Lexiscan was performed under the supervision of the Cardiology staff. At peak effect of the drug, 30 mCi [id number] was injected intravenously and standard myocardial SPECT imaging was performed. Quantitative gated imaging was also performed to evaluate left ventricular wall motion, and estimate left ventricular ejection fraction.  COMPARISON:  None.  FINDINGS: Perfusion: Decreased uptake in the inferior wall on both sets of imaging suggesting inferior wall scar. Associated wall motion abnormality. No reversible defects to suggest ischemia.  Wall Motion: Mild global hypokinesis and inferior wall motion abnormality.  Left Ventricular Ejection Fraction: 35 %  End diastolic volume 11697l  End systolic volume 75 ml  IMPRESSION: 1.  Inferior wall scar.  No ischemia.  2. Mild global hypokinesis and inferior wall motion abnormality.  3. Left ventricular ejection fraction 35%  4. Intermediate-risk stress test findings*.  *2012 Appropriate Use Criteria for Coronary Revascularization Focused Update: J Am Coll Cardiol. 5366;44(0):347-425. http://content.airportbarriers.com.aspx?articleid=1201161   Electronically Signed   By: Kalman Jewels M.D.   On: 03/21/2014 15:34   Dg Foot Complete Left  03/17/2014   CLINICAL DATA:  Infection of second toe of left foot.  EXAM: LEFT FOOT - COMPLETE  3+ VIEW  COMPARISON:  None.  FINDINGS: Advanced vascular calcifications. Mild hallux valgus deformity with osteoarthritis of the first metatarsal phalangeal joint. Soft tissue defect about the distal second digit. No gross osseous destruction identified. Extensive overlap of digits on the lateral view. No soft tissue gas.  IMPRESSION: Suspect soft tissue ulcer about the second digit, without gross osseous destruction. Dependent on clinical concern, consider contrast-enhanced MRI.   Electronically Signed   By: Abigail Miyamoto M.D.   On: 03/17/2014 18:52   Hospital records reviewed  Assessment/Plan   ICD-9-CM ICD-10-CM   1. Critical lower limb ischemia s/p left 4th toe amputation 459.9 I99.8   2. Type 2 diabetes mellitus with foot ulcer 250.80 E11.621    707.15 L97.509   3. End stage renal disease-on HD T ThSa 585.6 N18.6   4. Protein-calorie malnutrition, severe 262 E43   5. Cellulitis of foot 682.7 L03.119   6. LVF 30% 429.9 I51.9   7. Essential hypertension, benign - controlled 401.1 I10   8. History of embolic stroke Z56.38 V56.43   9. Urinary retention with foley cath 788.20 R33.9    --control pain. New rx for percocet written today  --keep appt with specialists as scheduled  --PT/OT/ST as indicated  --HD qTThSa  --finish abx Doxy  --nutritional supplement BID  --wound care as indicated  --SSI qAC  --continue other medications as ordered  --will follow  GOAL: short term rehab and d/c home when medically appropriate. Communicated with pt and nursing.   Arliss Frisina S. Perlie Gold  [facility] [address] [place], [place] [address] [phone] Office (Wednesdays and Fridays 8 AM - 5 PM) [phone] Cell (Monday-Friday 8 AM - 5 PM)

## 2014-04-02 ENCOUNTER — Non-Acute Institutional Stay (SKILLED_NURSING_FACILITY): Payer: Medicare Other | Admitting: Adult Health

## 2014-04-02 ENCOUNTER — Telehealth: Payer: Self-pay | Admitting: Vascular Surgery

## 2014-04-02 DIAGNOSIS — E11621 Type 2 diabetes mellitus with foot ulcer: Secondary | ICD-10-CM | POA: Diagnosis not present

## 2014-04-02 DIAGNOSIS — L97509 Non-pressure chronic ulcer of other part of unspecified foot with unspecified severity: Secondary | ICD-10-CM

## 2014-04-02 NOTE — Telephone Encounter (Signed)
-----   Message from Erenest Blank, RN sent at 04/01/2014 10:56 AM EST ----- Regarding: needs appts. rescheduled Per request of Dr. Arrie Aran, please reschedule her appts. to evaluate for new access (clotted left arm AVF); Needs right arm Art. Duplex, Vein mapping and MD appt. with any MD; missed her 2/5 appt. due to being in hospital.

## 2014-04-02 NOTE — Telephone Encounter (Signed)
LM for patient and KC about appt. dpm

## 2014-04-02 NOTE — Telephone Encounter (Signed)
Closed encounter °

## 2014-04-04 ENCOUNTER — Ambulatory Visit: Payer: Medicare Other | Admitting: Endocrinology

## 2014-04-08 ENCOUNTER — Encounter: Payer: Self-pay | Admitting: Vascular Surgery

## 2014-04-09 ENCOUNTER — Encounter: Payer: Self-pay | Admitting: Vascular Surgery

## 2014-04-09 ENCOUNTER — Ambulatory Visit (INDEPENDENT_AMBULATORY_CARE_PROVIDER_SITE_OTHER): Payer: Self-pay | Admitting: Vascular Surgery

## 2014-04-09 VITALS — BP 98/64 | HR 108 | Resp 14 | Ht 63.0 in | Wt 84.0 lb

## 2014-04-09 DIAGNOSIS — I739 Peripheral vascular disease, unspecified: Secondary | ICD-10-CM

## 2014-04-09 NOTE — Progress Notes (Signed)
Patient is a 69 year old female who returns for follow-up today after recent popliteal and tibial peroneal trunk endarterectomy. She also had amputation of her left second toe. She denies any pain in her left foot. She denies any significant drainage. She has no fever or chills. She is currently residing at [facility].  Physical exam:  Filed Vitals:   04/09/14 1451  BP: 98/64  Pulse: 108  Resp: 14  Height: 5\' 3"  (1.6 m)  Weight: 84 lb (38.102 kg)    Left lower extremity: Well-healed below-knee incision palpable popliteal pulse, left foot some fibrinous exudate minimal granulation tissue, dark discoloration adjacent toes, foot is warm all the way out to the region of the toes   Assessment: Poorly healing left second toe amputation site with forefoot still ischemic despite revascularization  Plan: I gave the patient the options today of considering transmetatarsal or below-knee amputation as I do not believe the toe amputation site is going to heal. She wishes conservative management with a few more weeks of wound care. She'll return sooner if the wound deteriorates further. Otherwise, I will see her back in follow-up in 3-4 weeks. She will do local wound care with hydrogel once daily.  Fabienne Bruns, MD [facility] Office: [phone] Pager: [phone]

## 2014-04-11 ENCOUNTER — Encounter: Payer: Self-pay | Admitting: Nephrology

## 2014-04-11 ENCOUNTER — Ambulatory Visit: Payer: Medicare Other | Admitting: Cardiology

## 2014-04-11 ENCOUNTER — Other Ambulatory Visit (HOSPITAL_COMMUNITY): Payer: Self-pay | Admitting: Internal Medicine

## 2014-04-14 ENCOUNTER — Encounter: Payer: Self-pay | Admitting: Physician Assistant

## 2014-04-16 ENCOUNTER — Encounter: Payer: Self-pay | Admitting: Physician Assistant

## 2014-04-16 ENCOUNTER — Ambulatory Visit (INDEPENDENT_AMBULATORY_CARE_PROVIDER_SITE_OTHER): Payer: Medicare Other | Admitting: Physician Assistant

## 2014-04-16 VITALS — BP 120/60 | HR 110 | Ht 63.0 in

## 2014-04-16 DIAGNOSIS — I255 Ischemic cardiomyopathy: Secondary | ICD-10-CM

## 2014-04-16 DIAGNOSIS — E785 Hyperlipidemia, unspecified: Secondary | ICD-10-CM

## 2014-04-16 DIAGNOSIS — I2510 Atherosclerotic heart disease of native coronary artery without angina pectoris: Secondary | ICD-10-CM | POA: Diagnosis not present

## 2014-04-16 DIAGNOSIS — I739 Peripheral vascular disease, unspecified: Secondary | ICD-10-CM | POA: Diagnosis not present

## 2014-04-16 NOTE — Assessment & Plan Note (Signed)
No obvious culprit lesions during left heart catheterization. Continue on beta blocker. Blood pressure stable at this time.

## 2014-04-16 NOTE — Assessment & Plan Note (Signed)
She appears euvolemic 

## 2014-04-16 NOTE — Progress Notes (Signed)
Patient ID: Elizabeth [name], female   DOB: 03/01/1945, 69 y.o.   MRN: [id number]    Date:  04/16/2014   ID:  Elizabeth [name], DOB May 02, 1945, MRN [id number]  PCP:  [name], MD  Primary Cardiologist:  hard[facility]   No chief complaint on file.    History of Present Illness: Elizabeth [name] is a 69 y.o. female with a history of Dyslipidemia, recent CVA, peripheral vascular disease, hypertension, diabetes mellitus. The patient presented to the hospital with complaints of infection of Left foot toe on 03/17/14- she has had wound on lt foot for 2 years now progressed to cellulitis/dry gangrene. She was seen and evaluated by Dr. Darrick Penna. PV angiogram was done 03/19/14 with occl of Ant. Tibial and posterior tibial arteries, 90% steonsis origin Lt peroneal artery, one vessel runoff Rt peroneal with in-line flow. She underwent right and left heart catheterization which revealed moderate disease involving the proximal and mid LAD as well as the ostium of OM1.  No obvious culprit lesion to explain inferior wall motion abnormalities and severely reduced EF as noted by echocardiogram. Suspect nonischemic cardiomyopathy.   She was last seen by Dr. Darrick Penna on February 17  at which time we give her the option of considering transmetatarsal or below the knee amputation. At that time she wishes conservative management.  She presents today for posthospital follow-up.  She is not very talkative but did say she had some chest pain and nausea this morning. That has resolved. She otherwise does not appear to be in any distress while sitting in her wheelchair. The patient currently denies vomiting, fever, shortness of breath, orthopnea, dizziness, PND, cough, congestion, abdominal pain, hematochezia, melena, lower extremity edema.  Wt Readings from Last 3 Encounters:  04/09/14 84 lb (38.102 kg)  03/28/14 85 lb 8.6 oz (38.8 kg)  02/18/14 86 lb (39.009 kg)     Past Medical History  Diagnosis Date  . Hyperlipidemia   .  Urinary retention     DX NEUROGENIC BLADDER--HAS INDWELLING FOLEY CATHETER  . Arthritis   . Anemia   . CAD (coronary artery disease)   . Neuropathic pain   . Stroke     patient not aware.  . Foley catheter in place     for urinary retention  . Critical lower limb ischemia   . Hypertension   . Peripheral vascular disease   . Hypoglycemia 08/19/2013  . Hypokalemia 08/19/2013  . ESRD (end stage renal disease)   . Diabetes mellitus     TAKES INSULIN     Current Outpatient Prescriptions  Medication Sig Dispense Refill  . aspirin EC 81 MG tablet Take 81 mg by mouth daily.    . carvedilol (COREG) 6.25 MG tablet Take 6.25 mg by mouth 2 (two) times daily with a meal.    . cyproheptadine (PERIACTIN) 4 MG tablet Take 4 mg by mouth daily.    Marland Kitchen docusate sodium (COLACE) 100 MG capsule Take 1 capsule (100 mg total) by mouth daily. 10 capsule 0  . glucose blood (ONETOUCH VERIO) test strip Use as instructed to check blood sugar 4 times per day dx code E13.10 150 each 3  . insulin aspart (NOVOLOG) 100 UNIT/ML injection Inject 0-15 Units into the skin 3 (three) times daily with meals. 10 mL 11  . insulin glargine (LANTUS) 100 UNIT/ML injection Inject 0.05 mLs (5 Units total) into the skin daily. 10 mL 11  . multivitamin (RENA-VIT) TABS tablet Take 1 tablet by mouth daily.    . Nutritional Supplements (  FEEDING SUPPLEMENT, NEPRO CARB STEADY,) LIQD Take 237 mLs by mouth as needed (missed meal during dialysis.). (Patient taking differently: Take 237 mLs by mouth daily. ) 30 Can 0  . Nutritional Supplements (FEEDING SUPPLEMENT, NEPRO CARB STEADY,) LIQD Take 237 mLs by mouth 3 (three) times daily between meals.  0  . ONETOUCH DELICA LANCETS FINE MISC Use to check blood sugar 4 times per day 200 each 3  . oxyCODONE-acetaminophen (PERCOCET/ROXICET) 5-325 MG per tablet Take 1 tablet by mouth every 8 (eight) hours as needed for moderate pain or severe pain. 30 tablet 0  . pravastatin (PRAVACHOL) 20 MG tablet Take  20 mg by mouth daily.    . promethazine (PHENERGAN) 12.5 MG tablet Take 1 tablet (12.5 mg total) by mouth every 6 (six) hours as needed for nausea or vomiting. 30 tablet 0  . saccharomyces boulardii (FLORASTOR) 250 MG capsule Take 1 capsule (250 mg total) by mouth 2 (two) times daily.    . sevelamer carbonate (RENVELA) 800 MG tablet Take 1 tablet (800 mg total) by mouth 3 (three) times daily with meals.     No current facility-administered medications for this visit.    Allergies:   No Known Allergies  Social History:  The patient  reports that she has never smoked. She has never used smokeless tobacco. She reports that she does not drink alcohol or use illicit drugs.   Family history:   Family History  Problem Relation Age of Onset  . Hyperlipidemia Mother   . Hypertension Mother   . Hodgkin's lymphoma Father   . Heart disease Sister     ROS:  Please see the history of present illness.  All other systems reviewed and negative.   PHYSICAL EXAM: VS:  BP 120/60 mmHg  Pulse 110  Ht 5\' 3"  (1.6 m) Very thin well developed, in no acute distress HEENT: Pupils are equal round react to light accommodation extraocular movements are intact.  Neck: no JVDNo cervical lymphadenopathy. Cardiac: Regular rhythm. Rate is elevated. No murmurs Lungs:  Wheezes or crackles with no wheezing.  Abd: soft, nontender, positive bowel sounds all quadrants,  Ext: no lower extremity edema.  2+ radial. Skin: warm and dry Neuro:  Grossly normal  EKG:  Sinus tachycardia rate 1 10 bpm. Septal Q waves  ASSESSMENT AND PLAN:  Problem List Items Addressed This Visit    Peripheral artery disease    She's being followed by Dr. Darrick Penna.      Moderate 3V CAD at cath 03/24/14 (Chronic)    No obvious culprit lesions during left heart catheterization. Continue on beta blocker. Blood pressure stable at this time.      HLD (hyperlipidemia) (Chronic)    Continue on a statin.   Lipid Panel     Component Value  Date/Time   CHOL 152 03/21/2014 0500   TRIG 109 03/21/2014 0500   HDL 54 03/21/2014 0500   CHOLHDL 2.8 03/21/2014 0500   VLDL 22 03/21/2014 0500   LDLCALC 76 03/21/2014 0500          Cardiomyopathy, ischemic - Primary    She appears euvolemic.      Relevant Orders   EKG 12-Lead

## 2014-04-16 NOTE — Patient Instructions (Signed)
Wilburt Finlay, PA-C, recommends that you schedule a follow-up appointment in 3 months with Dr Herbie Baltimore.

## 2014-04-16 NOTE — Assessment & Plan Note (Signed)
She's being followed by Dr. Darrick Penna.

## 2014-04-16 NOTE — Assessment & Plan Note (Signed)
Continue on a statin.   Lipid Panel     Component Value Date/Time   CHOL 152 03/21/2014 0500   TRIG 109 03/21/2014 0500   HDL 54 03/21/2014 0500   CHOLHDL 2.8 03/21/2014 0500   VLDL 22 03/21/2014 0500   LDLCALC 76 03/21/2014 0500

## 2014-04-17 ENCOUNTER — Encounter: Payer: Self-pay | Admitting: Vascular Surgery

## 2014-04-17 NOTE — Telephone Encounter (Signed)
Rx(s) sent to pharmacy electronically.  

## 2014-04-18 ENCOUNTER — Encounter: Payer: Self-pay | Admitting: Vascular Surgery

## 2014-04-18 ENCOUNTER — Other Ambulatory Visit: Payer: Self-pay

## 2014-04-18 ENCOUNTER — Ambulatory Visit (INDEPENDENT_AMBULATORY_CARE_PROVIDER_SITE_OTHER)
Admission: RE | Admit: 2014-04-18 | Discharge: 2014-04-18 | Disposition: A | Discharge status: Home / Self Care | Payer: Medicare Other | Source: Ambulatory Visit | Attending: Vascular Surgery | Admitting: Vascular Surgery

## 2014-04-18 ENCOUNTER — Ambulatory Visit (INDEPENDENT_AMBULATORY_CARE_PROVIDER_SITE_OTHER): Payer: Medicare Other | Admitting: Vascular Surgery

## 2014-04-18 ENCOUNTER — Ambulatory Visit (HOSPITAL_COMMUNITY)
Admission: RE | Admit: 2014-04-18 | Discharge: 2014-04-18 | Disposition: A | Discharge status: Home / Self Care | Payer: Medicare Other | Source: Ambulatory Visit | Attending: Vascular Surgery | Admitting: Vascular Surgery

## 2014-04-18 VITALS — BP 120/62 | HR 86 | Wt 84.0 lb

## 2014-04-18 DIAGNOSIS — I2510 Atherosclerotic heart disease of native coronary artery without angina pectoris: Secondary | ICD-10-CM

## 2014-04-18 DIAGNOSIS — Z01810 Encounter for preprocedural cardiovascular examination: Secondary | ICD-10-CM | POA: Insufficient documentation

## 2014-04-18 DIAGNOSIS — N186 End stage renal disease: Secondary | ICD-10-CM | POA: Diagnosis not present

## 2014-04-18 DIAGNOSIS — Z992 Dependence on renal dialysis: Secondary | ICD-10-CM

## 2014-04-18 NOTE — Progress Notes (Addendum)
Established Dialysis Access  History of Present Illness  Elizabeth [name] is a 69 y.o. (1945/10/11) female who presents for re-evaluation for permanent access.  The patient is right hand dominant.  Previous access procedures have been completed in the left arm: L staged BVT.  The patient's complication from previous access procedures include: thrombosis.  The patient has never had a previous PPM placed.  Past Medical History  Diagnosis Date  . Hyperlipidemia   . Urinary retention     DX NEUROGENIC BLADDER--HAS INDWELLING FOLEY CATHETER  . Arthritis   . Anemia   . CAD (coronary artery disease)   . Neuropathic pain   . Stroke     patient not aware.  . Foley catheter in place     for urinary retention  . Critical lower limb ischemia   . Hypertension   . Peripheral vascular disease   . Hypoglycemia 08/19/2013  . Hypokalemia 08/19/2013  . ESRD (end stage renal disease)   . Diabetes mellitus     TAKES INSULIN     Past Surgical History  Procedure Laterality Date  . No previous surgery    . Insertion of suprapubic catheter N/A 05/16/2012    Procedure: INSERTION OF SUPRAPUBIC CATHETER;  Surgeon: Sebastian Ache, MD;  Location: [facility] ORS;  Service: Urology;  Laterality: N/A;  . Bascilic vein transposition Left 10/30/2012    Procedure: LEFT 1ST STAGE BASCILIC VEIN TRANSPOSITION;  Surgeon: Chuck Hint, MD;  Location: [facility] OR;  Service: Vascular;  Laterality: Left;  . Nm myocar perf wall motion  04/19/2012    low risk study-EF 44%,mild inferolateral hypkinesis  . Bascilic vein transposition Left 02/05/2013    Procedure: BASCILIC VEIN TRANSPOSITION 2ND STAGE;  Surgeon: Chuck Hint, MD;  Location: [facility] OR;  Service: Vascular;  Laterality: Left;  . Radiology with anesthesia N/A 10/25/2013    Procedure: RADIOLOGY WITH ANESTHESIA;  Surgeon: Medication Radiologist, MD;  Location: [facility] OR;  Service: Radiology;  Laterality: N/A;  . Fistulogram Left 04/29/2013    Procedure: FISTULOGRAM;   Surgeon: Chuck Hint, MD;  Location: [facility] CATH LAB;  Service: Cardiovascular;  Laterality: Left;  . Angioplasty Left 04/29/2013    Procedure: ANGIOPLASTY;  Surgeon: Chuck Hint, MD;  Location: [facility] CATH LAB;  Service: Cardiovascular;  Laterality: Left;  . Abdominal aortagram N/A 03/19/2014    Procedure: ABDOMINAL Ronny Flurry;  Surgeon: Sherren Kerns, MD;  Location: [facility] CATH LAB;  Service: Cardiovascular;  Laterality: N/A;  . Left and right heart catheterization with coronary angiogram N/A 03/24/2014    Procedure: LEFT AND RIGHT HEART CATHETERIZATION WITH CORONARY ANGIOGRAM;  Surgeon: Marykay Lex, MD;  Location: [facility] CATH LAB;  Service: Cardiovascular;  Laterality: N/A;  . Endarterectomy popliteal Left 03/26/2014    Procedure: Left popliteal and peroneal artery endarterectomy with vein patch angioplasty;  Surgeon: Sherren Kerns, MD;  Location: [facility] OR;  Service: Vascular;  Laterality: Left;  . Amputation Left 03/26/2014    Procedure: AMPUTATION SECOND LEFT TOE;  Surgeon: Sherren Kerns, MD;  Location: [facility] OR;  Service: Vascular;  Laterality: Left;    History   Social History  . Marital Status: Single    Spouse Name: N/A  . Number of Children: N/A  . Years of Education: N/A   Occupational History  . Not on file.   Social History Main Topics  . Smoking status: Never Smoker   . Smokeless tobacco: Never Used  . Alcohol Use: No  . Drug Use: No  .  Sexual Activity: No   Other Topics Concern  . Not on file   Social History Narrative    Family History  Problem Relation Age of Onset  . Hyperlipidemia Mother   . Hypertension Mother   . Hodgkin's lymphoma Father   . Heart disease Sister     Current Outpatient Prescriptions on File Prior to Visit  Medication Sig Dispense Refill  . aspirin EC 81 MG tablet Take 81 mg by mouth daily.    . carvedilol (COREG) 6.25 MG tablet take 1 tablet by mouth twice a day 60 tablet 11  . cyproheptadine (PERIACTIN) 4 MG tablet Take 4 mg by  mouth daily.    Marland Kitchen docusate sodium (COLACE) 100 MG capsule Take 1 capsule (100 mg total) by mouth daily. 10 capsule 0  . glucose blood (ONETOUCH VERIO) test strip Use as instructed to check blood sugar 4 times per day dx code E13.10 150 each 3  . insulin aspart (NOVOLOG) 100 UNIT/ML injection Inject 0-15 Units into the skin 3 (three) times daily with meals. 10 mL 11  . insulin glargine (LANTUS) 100 UNIT/ML injection Inject 0.05 mLs (5 Units total) into the skin daily. 10 mL 11  . multivitamin (RENA-VIT) TABS tablet Take 1 tablet by mouth daily.    . Nutritional Supplements (FEEDING SUPPLEMENT, NEPRO CARB STEADY,) LIQD Take 237 mLs by mouth as needed (missed meal during dialysis.). (Patient taking differently: Take 237 mLs by mouth daily. ) 30 Can 0  . Nutritional Supplements (FEEDING SUPPLEMENT, NEPRO CARB STEADY,) LIQD Take 237 mLs by mouth 3 (three) times daily between meals.  0  . ONETOUCH DELICA LANCETS FINE MISC Use to check blood sugar 4 times per day 200 each 3  . oxyCODONE-acetaminophen (PERCOCET/ROXICET) 5-325 MG per tablet Take 1 tablet by mouth every 8 (eight) hours as needed for moderate pain or severe pain. 30 tablet 0  . pravastatin (PRAVACHOL) 20 MG tablet Take 20 mg by mouth daily.    . promethazine (PHENERGAN) 12.5 MG tablet Take 1 tablet (12.5 mg total) by mouth every 6 (six) hours as needed for nausea or vomiting. 30 tablet 0  . saccharomyces boulardii (FLORASTOR) 250 MG capsule Take 1 capsule (250 mg total) by mouth 2 (two) times daily.    . sevelamer carbonate (RENVELA) 800 MG tablet Take 1 tablet (800 mg total) by mouth 3 (three) times daily with meals.     No current facility-administered medications on file prior to visit.    No Known Allergies  REVIEW OF SYSTEMS:  (Positives checked otherwise negative)  CARDIOVASCULAR:   chest pain,  chest pressure,  palpitations,  shortness of breath when laying flat,  shortness of breath with exertion,   pain in feet when  walking,  pain in feet when laying flat,  history of blood clot in veins (DVT),  history of phlebitis,  swelling in legs,  varicose veins  PULMONARY:   productive cough,  asthma,  wheezing  NEUROLOGIC:   weakness in arms or legs,  numbness in arms or legs,  difficulty speaking or slurred speech,  temporary loss of vision in one eye,  dizziness  HEMATOLOGIC:   bleeding problems,  problems with blood clotting too easily  MUSCULOSKEL:   joint pain,  joint swelling  GASTROINTEST:   vomiting blood,  blood in stool     GENITOURINARY:   burning with urination,  blood in urine,  ESRD: T-R-S  PSYCHIATRIC:   history of major depression  INTEGUMENTARY:   rashes,   ulcers    Physical Examination  Filed Vitals:   04/18/14 1520  BP: 120/62  Pulse: 86  Weight: 84 lb (38.102 kg)  SpO2: 100%   Body mass index is 14.88 kg/(m^2).  General: A&O x 3, WD, Cachectic   Pulmonary: Sym exp, good air movt, CTAB, no rales, rhonchi, & wheezing  Cardiac: RRR, Nl S1, S2, no Murmurs, rubs or gallops  Vascular: Vessel Right Left  Radial Palpable Palpable  Ulnar Palpable Palpable  Brachial Palpable Palpable   Gastrointestinal: soft, NTND, -G/R, - HSM, - masses, - CVAT B  Musculoskeletal: M/S 5/5 throughout , Extremities without  ischemic changes , visible cephalic vein ~3 mm on Sonosite  Neurologic: Pain and light touch intact in extremities , Motor exam as listed above  Non-Invasive Vascular Imaging  Vein Mapping  (Date: 04/18/2014):   R arm: acceptable vein conduits include marginal basilic  L arm: acceptable vein conduits include upper arm cephlaic  BUE arterial doppler (04/18/2014)   R: Brachial adequate  L: Brachial adequate   Medical Decision Making  Elizabeth [name] is a 69 y.o. female who presents with ESRD requiring hemodialysis, s/p popliteal and tibial peroneal trunk endarterectomy, s/p left second toe amp (Dr.  Darrick Penna)   Based on vein mapping and examination, this patient's permanent access options include: L BC AVF  I had an extensive discussion with this patient in regards to the nature of access surgery, including risk, benefits, and alternatives.    The patient is aware that the risks of access surgery include but are not limited to: bleeding, infection, steal syndrome, nerve damage, ischemic monomelic neuropathy, failure of access to mature, and possible need for additional access procedures in the future.  The patient has agreed to proceed with the above procedure which will be scheduled 7 MAR 16.  Leonides Sake, MD [facility] Office: [phone] Pager: [phone]  04/18/2014, 4:17 PM

## 2014-04-25 ENCOUNTER — Encounter (HOSPITAL_COMMUNITY): Payer: Self-pay | Admitting: *Deleted

## 2014-04-25 NOTE — Pre-Procedure Instructions (Signed)
Elizabeth [name]  04/25/2014   Your procedure is scheduled on:  Monday, April 28, 2014 at 10:00 AM.   Report to [facility] Entrance "A" Admitting Office at 7:30 AM.   Call this number if you have problems the morning of surgery: [phone]   Remember:   Do not eat food or drink liquids after midnight Sunday, 04/27/14.   Take these medicines the morning of surgery with A SIP OF WATER: Aspirin, Carvedilol (Coreg), Oxycodone - if needed  Stop Vitamins as of today.  Do not take any diabetic medications the morning of surgery.   Do not wear jewelry, make-up or nail polish.  Do not wear lotions, powders, or perfumes. You may wear deodorant.  Do not shave 48 hours prior to surgery.   Do not bring valuables to the hospital.  [facility] is not responsible                  for any belongings or valuables.               Contacts, dentures or bridgework may not be worn into surgery.  Leave suitcase in the car. After surgery it may be brought to your room.  For patients admitted to the hospital, discharge time is determined by your                treatment team.            Any questions today, please call Hyman Bible, RN at [phone]

## 2014-04-25 NOTE — Progress Notes (Signed)
Pt is a resident at [facility] at [employer]. Spoke with Maryan Char, nurse for Mrs. Beirne. She states pt is alert and oriented, able to speak for herself. She verified medical history is same as when pt was here in February. Asked Tish to send MAR to [place]. She requested that I fax pre-op instructions to her at [phone]. Copy in chart.

## 2014-04-27 MED ORDER — DEXTROSE 5 % IV SOLN
1.5000 g | INTRAVENOUS | Status: AC
  Administered 2014-04-28: 1.5 g via INTRAVENOUS
  Filled 2014-04-27: qty 1.5

## 2014-04-28 ENCOUNTER — Encounter (HOSPITAL_COMMUNITY): Payer: Self-pay | Admitting: *Deleted

## 2014-04-28 ENCOUNTER — Encounter (HOSPITAL_COMMUNITY)
Admission: RE | Disposition: A | Discharge status: Skilled Nursing Facility | Payer: Self-pay | Source: Ambulatory Visit | Attending: Vascular Surgery

## 2014-04-28 ENCOUNTER — Ambulatory Visit (HOSPITAL_COMMUNITY): Payer: Medicare Other

## 2014-04-28 ENCOUNTER — Ambulatory Visit (HOSPITAL_COMMUNITY)
Admission: RE | Admit: 2014-04-28 | Discharge: 2014-04-28 | Disposition: A | Discharge status: Skilled Nursing Facility | Payer: Medicare Other | Source: Ambulatory Visit | Attending: Vascular Surgery | Admitting: Vascular Surgery

## 2014-04-28 ENCOUNTER — Ambulatory Visit (HOSPITAL_COMMUNITY): Payer: Medicare Other | Admitting: Anesthesiology

## 2014-04-28 DIAGNOSIS — E785 Hyperlipidemia, unspecified: Secondary | ICD-10-CM | POA: Insufficient documentation

## 2014-04-28 DIAGNOSIS — Z992 Dependence on renal dialysis: Secondary | ICD-10-CM | POA: Insufficient documentation

## 2014-04-28 DIAGNOSIS — E876 Hypokalemia: Secondary | ICD-10-CM | POA: Insufficient documentation

## 2014-04-28 DIAGNOSIS — Z89422 Acquired absence of other left toe(s): Secondary | ICD-10-CM | POA: Diagnosis not present

## 2014-04-28 DIAGNOSIS — Z7982 Long term (current) use of aspirin: Secondary | ICD-10-CM | POA: Diagnosis not present

## 2014-04-28 DIAGNOSIS — N319 Neuromuscular dysfunction of bladder, unspecified: Secondary | ICD-10-CM | POA: Diagnosis not present

## 2014-04-28 DIAGNOSIS — E119 Type 2 diabetes mellitus without complications: Secondary | ICD-10-CM | POA: Diagnosis not present

## 2014-04-28 DIAGNOSIS — R0602 Shortness of breath: Secondary | ICD-10-CM

## 2014-04-28 DIAGNOSIS — M1990 Unspecified osteoarthritis, unspecified site: Secondary | ICD-10-CM | POA: Insufficient documentation

## 2014-04-28 DIAGNOSIS — N186 End stage renal disease: Secondary | ICD-10-CM | POA: Diagnosis not present

## 2014-04-28 DIAGNOSIS — I2510 Atherosclerotic heart disease of native coronary artery without angina pectoris: Secondary | ICD-10-CM | POA: Insufficient documentation

## 2014-04-28 DIAGNOSIS — N185 Chronic kidney disease, stage 5: Secondary | ICD-10-CM

## 2014-04-28 DIAGNOSIS — Z794 Long term (current) use of insulin: Secondary | ICD-10-CM | POA: Insufficient documentation

## 2014-04-28 DIAGNOSIS — Z9861 Coronary angioplasty status: Secondary | ICD-10-CM | POA: Diagnosis not present

## 2014-04-28 DIAGNOSIS — R05 Cough: Secondary | ICD-10-CM

## 2014-04-28 DIAGNOSIS — D649 Anemia, unspecified: Secondary | ICD-10-CM | POA: Diagnosis not present

## 2014-04-28 DIAGNOSIS — R059 Cough, unspecified: Secondary | ICD-10-CM

## 2014-04-28 DIAGNOSIS — I739 Peripheral vascular disease, unspecified: Secondary | ICD-10-CM | POA: Insufficient documentation

## 2014-04-28 DIAGNOSIS — I120 Hypertensive chronic kidney disease with stage 5 chronic kidney disease or end stage renal disease: Secondary | ICD-10-CM | POA: Diagnosis present

## 2014-04-28 DIAGNOSIS — Z8673 Personal history of transient ischemic attack (TIA), and cerebral infarction without residual deficits: Secondary | ICD-10-CM | POA: Diagnosis not present

## 2014-04-28 HISTORY — PX: AV FISTULA PLACEMENT: SHX1204

## 2014-04-28 LAB — POCT I-STAT 4, (NA,K, GLUC, HGB,HCT)
GLUCOSE: 234 mg/dL — AB (ref 70–99)
HEMATOCRIT: 35 % — AB (ref 36.0–46.0)
Hemoglobin: 11.9 g/dL — ABNORMAL LOW (ref 12.0–15.0)
Potassium: 4.6 mmol/L (ref 3.5–5.1)
Sodium: 136 mmol/L (ref 135–145)

## 2014-04-28 LAB — GLUCOSE, CAPILLARY
GLUCOSE-CAPILLARY: 230 mg/dL — AB (ref 70–99)
GLUCOSE-CAPILLARY: 177 mg/dL — AB (ref 70–99)
Glucose-Capillary: 186 mg/dL — ABNORMAL HIGH (ref 70–99)

## 2014-04-28 SURGERY — ARTERIOVENOUS (AV) FISTULA CREATION
Anesthesia: General | Site: Arm Upper | Laterality: Left

## 2014-04-28 MED ORDER — PROPOFOL 10 MG/ML IV BOLUS
INTRAVENOUS | Status: DC | PRN
  Administered 2014-04-28: 50 mg via INTRAVENOUS

## 2014-04-28 MED ORDER — PHENYLEPHRINE 40 MCG/ML (10ML) SYRINGE FOR IV PUSH (FOR BLOOD PRESSURE SUPPORT)
PREFILLED_SYRINGE | INTRAVENOUS | Status: AC
  Filled 2014-04-28: qty 10

## 2014-04-28 MED ORDER — SODIUM CHLORIDE 0.9 % IV SOLN
INTRAVENOUS | Status: DC | PRN
  Administered 2014-04-28: 10:00:00 via INTRAVENOUS

## 2014-04-28 MED ORDER — SODIUM CHLORIDE 0.9 % IV SOLN
INTRAVENOUS | Status: DC
  Administered 2014-04-28: 09:00:00 via INTRAVENOUS

## 2014-04-28 MED ORDER — LIDOCAINE HCL (CARDIAC) 20 MG/ML IV SOLN
INTRAVENOUS | Status: AC
  Filled 2014-04-28: qty 5

## 2014-04-28 MED ORDER — ACETAMINOPHEN-CODEINE #3 300-30 MG PO TABS: 1.0000 | ORAL_TABLET | Freq: Four times a day (QID) | ORAL | Status: DC | PRN

## 2014-04-28 MED ORDER — 0.9 % SODIUM CHLORIDE (POUR BTL) OPTIME
TOPICAL | Status: DC | PRN
  Administered 2014-04-28: 1000 mL

## 2014-04-28 MED ORDER — PHENYLEPHRINE HCL 10 MG/ML IJ SOLN
INTRAMUSCULAR | Status: DC | PRN
  Administered 2014-04-28 (×3): 80 ug via INTRAVENOUS

## 2014-04-28 MED ORDER — ARTIFICIAL TEARS OP OINT
TOPICAL_OINTMENT | OPHTHALMIC | Status: AC
  Filled 2014-04-28: qty 3.5

## 2014-04-28 MED ORDER — ONDANSETRON HCL 4 MG/2ML IJ SOLN
INTRAMUSCULAR | Status: AC
  Filled 2014-04-28: qty 2

## 2014-04-28 MED ORDER — CARVEDILOL 6.25 MG PO TABS
6.2500 mg | ORAL_TABLET | Freq: Once | ORAL | Status: AC
  Administered 2014-04-28: 6.25 mg via ORAL
  Filled 2014-04-28: qty 1

## 2014-04-28 MED ORDER — FENTANYL CITRATE 0.05 MG/ML IJ SOLN
INTRAMUSCULAR | Status: DC | PRN
  Administered 2014-04-28: 50 ug via INTRAVENOUS

## 2014-04-28 MED ORDER — THROMBIN 20000 UNITS EX SOLR
CUTANEOUS | Status: AC
  Filled 2014-04-28: qty 20000

## 2014-04-28 MED ORDER — HYDROMORPHONE HCL 1 MG/ML IJ SOLN: 0.2500 mg | INTRAMUSCULAR | Status: DC | PRN

## 2014-04-28 MED ORDER — PROMETHAZINE HCL 25 MG/ML IJ SOLN: 6.2500 mg | INTRAMUSCULAR | Status: DC | PRN

## 2014-04-28 MED ORDER — FENTANYL CITRATE 0.05 MG/ML IJ SOLN
INTRAMUSCULAR | Status: AC
  Filled 2014-04-28: qty 5

## 2014-04-28 MED ORDER — SODIUM CHLORIDE 0.9 % IV SOLN
10.0000 mg | INTRAVENOUS | Status: DC | PRN
  Administered 2014-04-28: 10 ug/min via INTRAVENOUS

## 2014-04-28 MED ORDER — CARVEDILOL 3.125 MG PO TABS
ORAL_TABLET | ORAL | Status: AC
  Administered 2014-04-28: 6.25 mg via ORAL
  Filled 2014-04-28: qty 2

## 2014-04-28 MED ORDER — PROPOFOL 10 MG/ML IV BOLUS
INTRAVENOUS | Status: AC
  Filled 2014-04-28: qty 20

## 2014-04-28 MED ORDER — LIDOCAINE HCL (CARDIAC) 20 MG/ML IV SOLN
INTRAVENOUS | Status: DC | PRN
  Administered 2014-04-28: 50 mg via INTRAVENOUS

## 2014-04-28 MED ORDER — OXYCODONE HCL 5 MG/5ML PO SOLN: 5.0000 mg | Freq: Once | ORAL | Status: DC | PRN

## 2014-04-28 MED ORDER — CHLORHEXIDINE GLUCONATE CLOTH 2 % EX PADS: 6.0000 | MEDICATED_PAD | Freq: Once | CUTANEOUS | Status: DC

## 2014-04-28 MED ORDER — OXYCODONE HCL 5 MG PO TABS: 5.0000 mg | ORAL_TABLET | Freq: Once | ORAL | Status: DC | PRN

## 2014-04-28 MED ORDER — SODIUM CHLORIDE 0.9 % IR SOLN
Status: DC | PRN
  Administered 2014-04-28: 500 mL

## 2014-04-28 SURGICAL SUPPLY — 32 items
ARMBAND PINK RESTRICT EXTREMIT (MISCELLANEOUS) ×3 IMPLANT
BLADE SURG 10 STRL SS (BLADE) ×3 IMPLANT
CANISTER SUCTION 2500CC (MISCELLANEOUS) ×3 IMPLANT
CLIP TI MEDIUM 6 (CLIP) ×3 IMPLANT
CLIP TI WIDE RED SMALL 6 (CLIP) ×3 IMPLANT
COVER PROBE W GEL 5X96 (DRAPES) ×6 IMPLANT
COVER SURGICAL LIGHT HANDLE (MISCELLANEOUS) ×3 IMPLANT
DECANTER SPIKE VIAL GLASS SM (MISCELLANEOUS) ×3 IMPLANT
ELECT REM PT RETURN 9FT ADLT (ELECTROSURGICAL) ×3
ELECTRODE REM PT RTRN 9FT ADLT (ELECTROSURGICAL) ×1 IMPLANT
GLOVE BIO SURGEON STRL SZ 6 (GLOVE) ×3 IMPLANT
GLOVE BIO SURGEON STRL SZ7 (GLOVE) ×3 IMPLANT
GLOVE BIOGEL PI IND STRL 7.5 (GLOVE) ×1 IMPLANT
GLOVE BIOGEL PI INDICATOR 7.5 (GLOVE) ×2
GLOVE ECLIPSE 7.5 STRL STRAW (GLOVE) ×3 IMPLANT
GOWN STRL REUS W/ TWL LRG LVL3 (GOWN DISPOSABLE) ×3 IMPLANT
GOWN STRL REUS W/TWL LRG LVL3 (GOWN DISPOSABLE) ×6
KIT BASIN OR (CUSTOM PROCEDURE TRAY) ×3 IMPLANT
KIT ROOM TURNOVER OR (KITS) ×3 IMPLANT
LIQUID BAND (GAUZE/BANDAGES/DRESSINGS) ×3 IMPLANT
NS IRRIG 1000ML POUR BTL (IV SOLUTION) ×3 IMPLANT
PACK CV ACCESS (CUSTOM PROCEDURE TRAY) ×3 IMPLANT
PAD ARMBOARD 7.5X6 YLW CONV (MISCELLANEOUS) ×6 IMPLANT
PROBE PENCIL 8 MHZ STRL DISP (MISCELLANEOUS) IMPLANT
SPONGE SURGIFOAM ABS GEL 100 (HEMOSTASIS) IMPLANT
SUT MNCRL AB 4-0 PS2 18 (SUTURE) ×3 IMPLANT
SUT PROLENE 6 0 BV (SUTURE) IMPLANT
SUT PROLENE 7 0 BV 1 (SUTURE) ×3 IMPLANT
SUT VIC AB 3-0 SH 27 (SUTURE) ×2
SUT VIC AB 3-0 SH 27X BRD (SUTURE) ×1 IMPLANT
UNDERPAD 30X30 INCONTINENT (UNDERPADS AND DIAPERS) ×3 IMPLANT
WATER STERILE IRR 1000ML POUR (IV SOLUTION) IMPLANT

## 2014-04-28 NOTE — Anesthesia Preprocedure Evaluation (Signed)
Anesthesia Evaluation   Patient awake    Reviewed: Allergy & Precautions, NPO status   History of Anesthesia Complications Negative for: history of anesthetic complications  Airway Mallampati: II  TM Distance: >3 FB Neck ROM: Full    Dental  (+) Missing, Dental Advisory Given   Pulmonary neg pulmonary ROS,    Pulmonary exam normal       Cardiovascular hypertension, + CAD and + Peripheral Vascular Disease     Neuro/Psych CVA negative psych ROS   GI/Hepatic negative GI ROS, Neg liver ROS,   Endo/Other  diabetes  Renal/GU ESRF and Dialysis     Musculoskeletal   Abdominal   Peds  Hematology   Anesthesia Other Findings   Reproductive/Obstetrics                             Anesthesia Physical Anesthesia Plan  ASA: III  Anesthesia Plan: General   Post-op Pain Management:    Induction: Intravenous  Airway Management Planned: LMA  Additional Equipment:   Intra-op Plan:   Post-operative Plan: Extubation in OR  Informed Consent: I have reviewed the patients History and Physical, chart, labs and discussed the procedure including the risks, benefits and alternatives for the proposed anesthesia with the patient or authorized representative who has indicated his/her understanding and acceptance.   Dental advisory given  Plan Discussed with: CRNA, Anesthesiologist and Surgeon  Anesthesia Plan Comments:         Anesthesia Quick Evaluation

## 2014-04-28 NOTE — H&P (View-Only) (Signed)
  Established Dialysis Access  History of Present Illness  Elizabeth [name] is a 69 y.o. (12/19/1945) female who presents for re-evaluation for permanent access.  The patient is right hand dominant.  Previous access procedures have been completed in the left arm: L staged BVT.  The patient's complication from previous access procedures include: thrombosis.  The patient has never had a previous PPM placed.  Past Medical History  Diagnosis Date  . Hyperlipidemia   . Urinary retention     DX NEUROGENIC BLADDER--HAS INDWELLING FOLEY CATHETER  . Arthritis   . Anemia   . CAD (coronary artery disease)   . Neuropathic pain   . Stroke     patient not aware.  . Foley catheter in place     for urinary retention  . Critical lower limb ischemia   . Hypertension   . Peripheral vascular disease   . Hypoglycemia 08/19/2013  . Hypokalemia 08/19/2013  . ESRD (end stage renal disease)   . Diabetes mellitus     TAKES INSULIN     Past Surgical History  Procedure Laterality Date  . No previous surgery    . Insertion of suprapubic catheter N/A 05/16/2012    Procedure: INSERTION OF SUPRAPUBIC CATHETER;  Surgeon: Theodore Manny, MD;  Location: [facility] ORS;  Service: Urology;  Laterality: N/A;  . Bascilic vein transposition Left 10/30/2012    Procedure: LEFT 1ST STAGE BASCILIC VEIN TRANSPOSITION;  Surgeon: Christopher S Dickson, MD;  Location: [facility] OR;  Service: Vascular;  Laterality: Left;  . Nm myocar perf wall motion  04/19/2012    low risk study-EF 44%,mild inferolateral hypkinesis  . Bascilic vein transposition Left 02/05/2013    Procedure: BASCILIC VEIN TRANSPOSITION 2ND STAGE;  Surgeon: Christopher S Dickson, MD;  Location: [facility] OR;  Service: Vascular;  Laterality: Left;  . Radiology with anesthesia N/A 10/25/2013    Procedure: RADIOLOGY WITH ANESTHESIA;  Surgeon: Medication Radiologist, MD;  Location: [facility] OR;  Service: Radiology;  Laterality: N/A;  . Fistulogram Left 04/29/2013    Procedure: FISTULOGRAM;   Surgeon: Christopher S Dickson, MD;  Location: [facility] CATH LAB;  Service: Cardiovascular;  Laterality: Left;  . Angioplasty Left 04/29/2013    Procedure: ANGIOPLASTY;  Surgeon: Christopher S Dickson, MD;  Location: [facility] CATH LAB;  Service: Cardiovascular;  Laterality: Left;  . Abdominal aortagram N/A 03/19/2014    Procedure: ABDOMINAL AORTAGRAM;  Surgeon: Charles E Fields, MD;  Location: [facility] CATH LAB;  Service: Cardiovascular;  Laterality: N/A;  . Left and right heart catheterization with coronary angiogram N/A 03/24/2014    Procedure: LEFT AND RIGHT HEART CATHETERIZATION WITH CORONARY ANGIOGRAM;  Surgeon: David W Harding, MD;  Location: [facility] CATH LAB;  Service: Cardiovascular;  Laterality: N/A;  . Endarterectomy popliteal Left 03/26/2014    Procedure: Left popliteal and peroneal artery endarterectomy with vein patch angioplasty;  Surgeon: Charles E Fields, MD;  Location: [facility] OR;  Service: Vascular;  Laterality: Left;  . Amputation Left 03/26/2014    Procedure: AMPUTATION SECOND LEFT TOE;  Surgeon: Charles E Fields, MD;  Location: [facility] OR;  Service: Vascular;  Laterality: Left;    History   Social History  . Marital Status: Single    Spouse Name: N/A  . Number of Children: N/A  . Years of Education: N/A   Occupational History  . Not on file.   Social History Main Topics  . Smoking status: Never Smoker   . Smokeless tobacco: Never Used  . Alcohol Use: No  . Drug Use: No  .   Sexual Activity: No   Other Topics Concern  . Not on file   Social History Narrative    Family History  Problem Relation Age of Onset  . Hyperlipidemia Mother   . Hypertension Mother   . Hodgkin's lymphoma Father   . Heart disease Sister     Current Outpatient Prescriptions on File Prior to Visit  Medication Sig Dispense Refill  . aspirin EC 81 MG tablet Take 81 mg by mouth daily.    . carvedilol (COREG) 6.25 MG tablet take 1 tablet by mouth twice a day 60 tablet 11  . cyproheptadine (PERIACTIN) 4 MG tablet Take 4 mg by  mouth daily.    . docusate sodium (COLACE) 100 MG capsule Take 1 capsule (100 mg total) by mouth daily. 10 capsule 0  . glucose blood (ONETOUCH VERIO) test strip Use as instructed to check blood sugar 4 times per day dx code E13.10 150 each 3  . insulin aspart (NOVOLOG) 100 UNIT/ML injection Inject 0-15 Units into the skin 3 (three) times daily with meals. 10 mL 11  . insulin glargine (LANTUS) 100 UNIT/ML injection Inject 0.05 mLs (5 Units total) into the skin daily. 10 mL 11  . multivitamin (RENA-VIT) TABS tablet Take 1 tablet by mouth daily.    . Nutritional Supplements (FEEDING SUPPLEMENT, NEPRO CARB STEADY,) LIQD Take 237 mLs by mouth as needed (missed meal during dialysis.). (Patient taking differently: Take 237 mLs by mouth daily. ) 30 Can 0  . Nutritional Supplements (FEEDING SUPPLEMENT, NEPRO CARB STEADY,) LIQD Take 237 mLs by mouth 3 (three) times daily between meals.  0  . ONETOUCH DELICA LANCETS FINE MISC Use to check blood sugar 4 times per day 200 each 3  . oxyCODONE-acetaminophen (PERCOCET/ROXICET) 5-325 MG per tablet Take 1 tablet by mouth every 8 (eight) hours as needed for moderate pain or severe pain. 30 tablet 0  . pravastatin (PRAVACHOL) 20 MG tablet Take 20 mg by mouth daily.    . promethazine (PHENERGAN) 12.5 MG tablet Take 1 tablet (12.5 mg total) by mouth every 6 (six) hours as needed for nausea or vomiting. 30 tablet 0  . saccharomyces boulardii (FLORASTOR) 250 MG capsule Take 1 capsule (250 mg total) by mouth 2 (two) times daily.    . sevelamer carbonate (RENVELA) 800 MG tablet Take 1 tablet (800 mg total) by mouth 3 (three) times daily with meals.     No current facility-administered medications on file prior to visit.    No Known Allergies  REVIEW OF SYSTEMS:  (Positives checked otherwise negative)  CARDIOVASCULAR:  [] chest pain, [] chest pressure, [] palpitations, [] shortness of breath when laying flat, [] shortness of breath with exertion,  [] pain in feet when  walking, [] pain in feet when laying flat, [] history of blood clot in veins (DVT), [] history of phlebitis, [] swelling in legs, [] varicose veins  PULMONARY:  [] productive cough, [] asthma, [] wheezing  NEUROLOGIC:  [] weakness in arms or legs, [] numbness in arms or legs, [] difficulty speaking or slurred speech, [] temporary loss of vision in one eye, [] dizziness  HEMATOLOGIC:  [] bleeding problems, [] problems with blood clotting too easily  MUSCULOSKEL:  [] joint pain, [] joint swelling  GASTROINTEST:  [] vomiting blood, [] blood in stool     GENITOURINARY:  [] burning with urination, [] blood in urine, [x] ESRD: T-R-S  PSYCHIATRIC:  [] history of major depression  INTEGUMENTARY:  [] rashes, []   ulcers    Physical Examination  Filed Vitals:   04/18/14 1520  BP: 120/62  Pulse: 86  Weight: 84 lb (38.102 kg)  SpO2: 100%   Body mass index is 14.88 kg/(m^2).  General: A&O x 3, WD, Cachectic   Pulmonary: Sym exp, good air movt, CTAB, no rales, rhonchi, & wheezing  Cardiac: RRR, Nl S1, S2, no Murmurs, rubs or gallops  Vascular: Vessel Right Left  Radial Palpable Palpable  Ulnar Palpable Palpable  Brachial Palpable Palpable   Gastrointestinal: soft, NTND, -G/R, - HSM, - masses, - CVAT B  Musculoskeletal: M/S 5/5 throughout , Extremities without  ischemic changes , visible cephalic vein ~3 mm on Sonosite  Neurologic: Pain and light touch intact in extremities , Motor exam as listed above  Non-Invasive Vascular Imaging  Vein Mapping  (Date: 04/18/2014):   R arm: acceptable vein conduits include marginal basilic  L arm: acceptable vein conduits include upper arm cephlaic  BUE arterial doppler (04/18/2014)   R: Brachial adequate  L: Brachial adequate   Medical Decision Making  Aikam Haser is a 69 y.o. female who presents with ESRD requiring hemodialysis, s/p popliteal and tibial peroneal trunk endarterectomy, s/p left second toe amp (Dr.  Fields)   Based on vein mapping and examination, this patient's permanent access options include: L BC AVF  I had an extensive discussion with this patient in regards to the nature of access surgery, including risk, benefits, and alternatives.    The patient is aware that the risks of access surgery include but are not limited to: bleeding, infection, steal syndrome, nerve damage, ischemic monomelic neuropathy, failure of access to mature, and possible need for additional access procedures in the future.  The patient has agreed to proceed with the above procedure which will be scheduled 7 MAR 16.  Elzia Hott, MD [facility] Office: [phone] Pager: [phone]  04/18/2014, 4:17 PM   

## 2014-04-28 NOTE — Interval H&P Note (Signed)
History and Physical Interval Note:  04/28/2014 7:23 AM  Elizabeth [name]  has presented today for surgery, with the diagnosis of End Stage Renal Disease N18.6  The various methods of treatment have been discussed with the patient and family. After consideration of risks, benefits and other options for treatment, the patient has consented to  Procedure(s): ARTERIOVENOUS (AV) FISTULA CREATION-left brachiocephalic (Left) as a surgical intervention .  The patient's history has been reviewed, patient examined, no change in status, stable for surgery.  I have reviewed the patient's chart and labs.  Questions were answered to the patient's satisfaction.     CHEN,BRIAN LIANG-YU

## 2014-04-28 NOTE — Progress Notes (Signed)
Spoke w/ Dr.Singer r/t tachycardia--no change from preop per pt and MD. No new orders. Will cont to monitor.

## 2014-04-28 NOTE — Transfer of Care (Signed)
Immediate Anesthesia Transfer of Care Note  Patient: Elizabeth [name]  Procedure(s) Performed: Procedure(s): CREATION OF LEFT BRACHIOCEPHALIC  ARTERIOVENOUS (AV) FISTULA CREATION (Left)  Patient Location: PACU  Anesthesia Type:General  Level of Consciousness: awake, alert , oriented and sedated  Airway & Oxygen Therapy: Patient Spontanous Breathing and Patient connected to nasal cannula oxygen  Post-op Assessment: Report given to RN, Post -op Vital signs reviewed and stable and Patient moving all extremities  Post vital signs: Reviewed and stable  Last Vitals:  Filed Vitals:   04/28/14 0818  BP: 130/72  Pulse: 108  Temp:   Resp:     Complications: No apparent anesthesia complications

## 2014-04-28 NOTE — Op Note (Signed)
    OPERATIVE NOTE   PROCEDURE: left brachiocephalic arteriovenous fistula placement  PRE-OPERATIVE DIAGNOSIS: end stage renal disease   POST-OPERATIVE DIAGNOSIS: same as above   SURGEON: Leonides Sake, MD  ANESTHESIA: general  ESTIMATED BLOOD LOSS: 50 cc  FINDING(S): 1.  Diseased small brachial artery (2.5 mm) 2.  Palpable thrill and faintly palpable radial pulse at end ofcase  SPECIMEN(S):  none  INDICATIONS:   Elizabeth [name] is a 69 y.o. female who presents with end stage renal disease.  The patient is scheduled for left brachiocephalic arteriovenous fistula placement.  The patient is aware the risks include but are not limited to: bleeding, infection, steal syndrome, nerve damage, ischemic monomelic neuropathy, failure to mature, and need for additional procedures.  The patient is aware of the risks of the procedure and elects to proceed forward.  DESCRIPTION: After full informed written consent was obtained from the patient, the patient was brought back to the operating room and placed supine upon the operating table.  Prior to induction, the patient received IV antibiotics.   After obtaining adequate anesthesia, the patient was then prepped and draped in the standard fashion for a left arm access procedure.  I turned my attention first to identifying the patient's cephalic vein and brachial artery.  Using SonoSite guidance, the location of these vessels were marked out on the skin.   I made a transverse incision at the level of the antecubitum and dissected through the subcutaneous tissue and fascia to gain exposure of the brachial artery.  This was noted to be 2.5 mm in diameter externally.  This was dissected out proximally and distally and controlled with vessel loops.  I then dissected out the cephalic vein.  This was noted to be 3 mm in diameter externally.  The distal segment of the vein was ligated with a  2-0 silk, and the vein was transected.  The proximal segment was  interrogated with serial dilators.  The vein accepted up to a 3.5 mm dilator without any difficulty.  I then instilled the heparinized saline into the vein and clamped it.  At this point, I reset my exposure of the brachial artery and placed the artery under tension proximally and distally.  I made an arteriotomy with a #11 blade, and then I extended the arteriotomy with a Potts scissor.  There was some anterior plaque present in this artery.  I injected heparinized saline proximal and distal to this arteriotomy.  The vein was then sewn to the artery in an end-to-side configuration with a running stitch of 7-0 Prolene.  Prior to completing this anastomosis, I allowed the vein and artery to backbleed.  There was no evidence of clot from any vessels.  I completed the anastomosis in the usual fashion and then released all vessel loops and clamps.  There was a palpable  thrill in the venous outflow, and there was a faintly palpable radial pulse.  At this point, I irrigated out the surgical wound.  There was no further active bleeding.  The subcutaneous tissue was reapproximated with a running stitch of 3-0 Vicryl.  The skin was then reapproximated with a running subcuticular stitch of 4-0 Vicryl.  The skin was then cleaned, dried, and reinforced with Dermabond.  The patient tolerated this procedure well.   COMPLICATIONS: none  CONDITION: stable   Leonides Sake, MD [facility] Office: [phone] Pager: [phone]  04/28/2014, 11:39 AM

## 2014-04-28 NOTE — Discharge Instructions (Addendum)
°What to eat: ° °For your first meals, you should eat lightly; only small meals initially.  If you do not have nausea, you may eat larger meals.  Avoid spicy, greasy and heavy food.   ° °General Anesthesia, Adult, Care After  °Refer to this sheet in the next few weeks. These instructions provide you with information on caring for yourself after your procedure. Your health care provider may also give you more specific instructions. Your treatment has been planned according to current medical practices, but problems sometimes occur. Call your health care provider if you have any problems or questions after your procedure.  °WHAT TO EXPECT AFTER THE PROCEDURE  °After the procedure, it is typical to experience:  °Sleepiness.  °Nausea and vomiting. °HOME CARE INSTRUCTIONS  °For the first 24 hours after general anesthesia:  °Have a responsible person with you.  °Do not drive a car. If you are alone, do not take public transportation.  °Do not drink alcohol.  °Do not take medicine that has not been prescribed by your health care provider.  °Do not sign important papers or make important decisions.  °You may resume a normal diet and activities as directed by your health care provider.  °Change bandages (dressings) as directed.  °If you have questions or problems that seem related to general anesthesia, call the hospital and ask for the anesthetist or anesthesiologist on call. °SEEK MEDICAL CARE IF:  °You have nausea and vomiting that continue the day after anesthesia.  °You develop a rash. °SEEK IMMEDIATE MEDICAL CARE IF:  °You have difficulty breathing.  °You have chest pain.  °You have any allergic problems. °Document Released: 05/16/2000 Document Revised: 10/10/2012 Document Reviewed: 08/23/2012  °[employer]® Patient Information ©2014 [employer].  ° °Sore Throat  ° ° °A sore throat is a painful, burning, sore, or scratchy feeling of the throat. There may be pain or tenderness when swallowing or talking. You may have  other symptoms with a sore throat. These include coughing, sneezing, fever, or a swollen neck. A sore throat is often the first sign of another sickness. These sicknesses may include a cold, flu, strep throat, or an infection called mono. Most sore throats go away without medical treatment.  °HOME CARE  °Only take medicine as told by your doctor.  °Drink enough fluids to keep your pee (urine) clear or pale yellow.  °Rest as needed.  °Try using throat sprays, lozenges, or suck on hard candy (if older than 4 years or as told).  °Sip warm liquids, such as broth, herbal tea, or warm water with honey. Try sucking on frozen ice pops or drinking cold liquids.  °Rinse the mouth (gargle) with salt water. Mix 1 teaspoon salt with 8 ounces of water.  °Do not smoke. Avoid being around others when they are smoking.  °Put a humidifier in your bedroom at night to moisten the air. You can also turn on a hot shower and sit in the bathroom for 5-10 minutes. Be sure the bathroom door is closed. °GET HELP RIGHT AWAY IF:  °You have trouble breathing.  °You cannot swallow fluids, soft foods, or your spit (saliva).  °You have more puffiness (swelling) in the throat.  °Your sore throat does not get better in 7 days.  °You feel sick to your stomach (nauseous) and throw up (vomit).  °You have a fever or lasting symptoms for more than 2-3 days.  °You have a fever and your symptoms suddenly get worse. °MAKE SURE YOU:  °Understand these   instructions.  Will watch your condition.  Will get help right away if you are not doing well or get worse. Document Released: 11/17/2007 Document Revised: 11/02/2011 Document Reviewed: 10/16/2011  [facility] Patient Information 2015 [place], [place]. This information is not intended to replace advice given to you by your health care provider. Make sure you discuss any questions you have with your health care provider.    General Anesthesia, Adult, Care After  Refer to this sheet in the next few weeks.  These instructions provide you with information on caring for yourself after your procedure. Your health care provider may also give you more specific instructions. Your treatment has been planned according to current medical practices, but problems sometimes occur. Call your health care provider if you have any problems or questions after your procedure.  WHAT TO EXPECT AFTER THE PROCEDURE  After the procedure, it is typical to experience:  Sleepiness.  Nausea and vomiting. HOME CARE INSTRUCTIONS  For the first 24 hours after general anesthesia:  Have a responsible person with you.  Do not drive a car. If you are alone, do not take public transportation.  Do not drink alcohol.  Do not take medicine that has not been prescribed by your health care provider.  Do not sign important papers or make important decisions.  You may resume a normal diet and activities as directed by your health care provider.  Change bandages (dressings) as directed.  If you have questions or problems that seem related to general anesthesia, call the hospital and ask for the anesthetist or anesthesiologist on call. SEEK MEDICAL CARE IF:  You have nausea and vomiting that continue the day after anesthesia.  You develop a rash. SEEK IMMEDIATE MEDICAL CARE IF:  You have difficulty breathing.  You have chest pain.  You have any allergic problems. Document Released: 05/16/2000 Document Revised: 10/10/2012 Document Reviewed: 08/23/2012  [facility] Patient Information 2014 [place], [place].

## 2014-04-28 NOTE — Progress Notes (Signed)
John, LPN at [facility] provided information r/t last dose of medications.

## 2014-04-28 NOTE — Progress Notes (Signed)
Patient reports cough x 1 week. Patient reports that cough causes her some shortness of breath. Patient with course cough. Patient states she is choosing not to cough up the mucous but feels like she could. Will obtain CXR per protocol

## 2014-04-28 NOTE — Anesthesia Postprocedure Evaluation (Signed)
Anesthesia Post Note  Patient: Elizabeth [name]  Procedure(s) Performed: Procedure(s) (LRB): CREATION OF LEFT BRACHIOCEPHALIC  ARTERIOVENOUS (AV) FISTULA CREATION (Left)  Anesthesia type: general  Patient location: PACU  Post pain: Pain level controlled  Post assessment: Patient's Cardiovascular Status Stable  Last Vitals:  Filed Vitals:   04/28/14 1245  BP: 104/59  Pulse: 103  Temp:   Resp: 12    Post vital signs: Reviewed and stable  Level of consciousness: sedated  Complications: No apparent anesthesia complications

## 2014-04-29 ENCOUNTER — Telehealth: Payer: Self-pay | Admitting: Vascular Surgery

## 2014-04-29 ENCOUNTER — Encounter (HOSPITAL_COMMUNITY): Payer: Self-pay | Admitting: Vascular Surgery

## 2014-04-29 NOTE — Telephone Encounter (Addendum)
-----   Message from Phillips Odor, RN sent at 04/28/2014  3:06 PM EST ----- Regarding: needs 6 wk. f/u with [facility]   ----- Message -----    From: Fransisco Hertz, MD    Sent: 04/28/2014  11:41 AM      To: [facility] [phone] 1945/07/23  PROCEDURE: left brachiocephalic arteriovenous fistula placement  Follow-up: 6 weeks  04/29/14: LM for pt re appt, dpm

## 2014-04-30 ENCOUNTER — Encounter (HOSPITAL_COMMUNITY): Payer: Self-pay | Admitting: Vascular Surgery

## 2014-05-01 ENCOUNTER — Telehealth: Payer: Self-pay

## 2014-05-01 NOTE — Telephone Encounter (Signed)
Phone call from nurse at [facility].  Reported increased depth of wound left 2nd toe amp, with increased foul odor.  Stated pt. is afebrile.  The pt. has an appt. on 05/08/14 to evaluate the toe amp site.  Reported updated symptoms to Dr. Darrick Penna.  Stated to have pt. keep appt. on 05/08/14.  Notified Shaquina of recommendation per Dr. Darrick Penna.  Verb. Understanding.

## 2014-05-07 ENCOUNTER — Encounter: Payer: Self-pay | Admitting: Vascular Surgery

## 2014-05-08 ENCOUNTER — Non-Acute Institutional Stay (SKILLED_NURSING_FACILITY): Payer: Medicare Other | Admitting: Adult Health

## 2014-05-08 ENCOUNTER — Ambulatory Visit: Payer: Medicare Other | Admitting: Vascular Surgery

## 2014-05-08 DIAGNOSIS — Z992 Dependence on renal dialysis: Secondary | ICD-10-CM

## 2014-05-08 DIAGNOSIS — N189 Chronic kidney disease, unspecified: Secondary | ICD-10-CM

## 2014-05-08 DIAGNOSIS — L97523 Non-pressure chronic ulcer of other part of left foot with necrosis of muscle: Secondary | ICD-10-CM

## 2014-05-08 DIAGNOSIS — N186 End stage renal disease: Secondary | ICD-10-CM

## 2014-05-08 DIAGNOSIS — I739 Peripheral vascular disease, unspecified: Secondary | ICD-10-CM

## 2014-05-08 DIAGNOSIS — E785 Hyperlipidemia, unspecified: Secondary | ICD-10-CM

## 2014-05-08 DIAGNOSIS — I255 Ischemic cardiomyopathy: Secondary | ICD-10-CM | POA: Diagnosis not present

## 2014-05-08 DIAGNOSIS — E1122 Type 2 diabetes mellitus with diabetic chronic kidney disease: Secondary | ICD-10-CM

## 2014-05-09 ENCOUNTER — Emergency Department (HOSPITAL_COMMUNITY): Payer: Medicare Other

## 2014-05-09 ENCOUNTER — Inpatient Hospital Stay (HOSPITAL_COMMUNITY)
Admission: EM | Admit: 2014-05-09 | Discharge: 2014-05-13 | DRG: 474 | Disposition: A | Discharge status: Skilled Nursing Facility | Payer: Medicare Other | Attending: Internal Medicine | Admitting: Internal Medicine

## 2014-05-09 ENCOUNTER — Encounter (HOSPITAL_COMMUNITY): Payer: Self-pay | Admitting: Emergency Medicine

## 2014-05-09 DIAGNOSIS — E785 Hyperlipidemia, unspecified: Secondary | ICD-10-CM | POA: Diagnosis present

## 2014-05-09 DIAGNOSIS — N186 End stage renal disease: Secondary | ICD-10-CM | POA: Diagnosis present

## 2014-05-09 DIAGNOSIS — L89154 Pressure ulcer of sacral region, stage 4: Secondary | ICD-10-CM | POA: Diagnosis present

## 2014-05-09 DIAGNOSIS — E11621 Type 2 diabetes mellitus with foot ulcer: Secondary | ICD-10-CM | POA: Diagnosis not present

## 2014-05-09 DIAGNOSIS — M869 Osteomyelitis, unspecified: Secondary | ICD-10-CM | POA: Diagnosis present

## 2014-05-09 DIAGNOSIS — L089 Local infection of the skin and subcutaneous tissue, unspecified: Secondary | ICD-10-CM | POA: Insufficient documentation

## 2014-05-09 DIAGNOSIS — I10 Essential (primary) hypertension: Secondary | ICD-10-CM | POA: Diagnosis not present

## 2014-05-09 DIAGNOSIS — Z794 Long term (current) use of insulin: Secondary | ICD-10-CM | POA: Diagnosis not present

## 2014-05-09 DIAGNOSIS — M84478A Pathological fracture, left toe(s), initial encounter for fracture: Secondary | ICD-10-CM | POA: Diagnosis present

## 2014-05-09 DIAGNOSIS — E119 Type 2 diabetes mellitus without complications: Secondary | ICD-10-CM | POA: Diagnosis present

## 2014-05-09 DIAGNOSIS — N2581 Secondary hyperparathyroidism of renal origin: Secondary | ICD-10-CM | POA: Diagnosis present

## 2014-05-09 DIAGNOSIS — Z8673 Personal history of transient ischemic attack (TIA), and cerebral infarction without residual deficits: Secondary | ICD-10-CM

## 2014-05-09 DIAGNOSIS — M86172 Other acute osteomyelitis, left ankle and foot: Principal | ICD-10-CM | POA: Diagnosis present

## 2014-05-09 DIAGNOSIS — D638 Anemia in other chronic diseases classified elsewhere: Secondary | ICD-10-CM | POA: Diagnosis present

## 2014-05-09 DIAGNOSIS — N319 Neuromuscular dysfunction of bladder, unspecified: Secondary | ICD-10-CM | POA: Diagnosis present

## 2014-05-09 DIAGNOSIS — Z992 Dependence on renal dialysis: Secondary | ICD-10-CM

## 2014-05-09 DIAGNOSIS — Z7982 Long term (current) use of aspirin: Secondary | ICD-10-CM | POA: Diagnosis not present

## 2014-05-09 DIAGNOSIS — I2510 Atherosclerotic heart disease of native coronary artery without angina pectoris: Secondary | ICD-10-CM | POA: Diagnosis present

## 2014-05-09 DIAGNOSIS — Z89512 Acquired absence of left leg below knee: Secondary | ICD-10-CM | POA: Diagnosis not present

## 2014-05-09 DIAGNOSIS — Z681 Body mass index (BMI) 19 or less, adult: Secondary | ICD-10-CM | POA: Diagnosis not present

## 2014-05-09 DIAGNOSIS — I120 Hypertensive chronic kidney disease with stage 5 chronic kidney disease or end stage renal disease: Secondary | ICD-10-CM | POA: Diagnosis present

## 2014-05-09 DIAGNOSIS — I70262 Atherosclerosis of native arteries of extremities with gangrene, left leg: Secondary | ICD-10-CM | POA: Diagnosis not present

## 2014-05-09 DIAGNOSIS — Z466 Encounter for fitting and adjustment of urinary device: Secondary | ICD-10-CM | POA: Diagnosis not present

## 2014-05-09 DIAGNOSIS — L97509 Non-pressure chronic ulcer of other part of unspecified foot with unspecified severity: Secondary | ICD-10-CM | POA: Diagnosis not present

## 2014-05-09 LAB — CBC WITH DIFFERENTIAL/PLATELET
BASOS PCT: 1 % (ref 0–1)
Basophils Absolute: 0.1 10*3/uL (ref 0.0–0.1)
EOS ABS: 0.1 10*3/uL (ref 0.0–0.7)
EOS PCT: 1 % (ref 0–5)
HCT: 37.1 % (ref 36.0–46.0)
Hemoglobin: 11.4 g/dL — ABNORMAL LOW (ref 12.0–15.0)
Lymphocytes Relative: 9 % — ABNORMAL LOW (ref 12–46)
Lymphs Abs: 0.8 10*3/uL (ref 0.7–4.0)
MCH: 26.8 pg (ref 26.0–34.0)
MCHC: 30.7 g/dL (ref 30.0–36.0)
MCV: 87.1 fL (ref 78.0–100.0)
Monocytes Absolute: 0.8 10*3/uL (ref 0.1–1.0)
Monocytes Relative: 10 % (ref 3–12)
NEUTROS PCT: 79 % — AB (ref 43–77)
Neutro Abs: 6.7 10*3/uL (ref 1.7–7.7)
PLATELETS: 252 10*3/uL (ref 150–400)
RBC: 4.26 MIL/uL (ref 3.87–5.11)
RDW: 20.6 % — AB (ref 11.5–15.5)
WBC: 8.5 10*3/uL (ref 4.0–10.5)

## 2014-05-09 LAB — COMPREHENSIVE METABOLIC PANEL
ALT: 11 U/L (ref 0–35)
AST: 14 U/L (ref 0–37)
Albumin: 2.1 g/dL — ABNORMAL LOW (ref 3.5–5.2)
Alkaline Phosphatase: 152 U/L — ABNORMAL HIGH (ref 39–117)
Anion gap: 12 (ref 5–15)
BUN: 43 mg/dL — ABNORMAL HIGH (ref 6–23)
CALCIUM: 8.4 mg/dL (ref 8.4–10.5)
CO2: 30 mmol/L (ref 19–32)
Chloride: 93 mmol/L — ABNORMAL LOW (ref 96–112)
Creatinine, Ser: 3.49 mg/dL — ABNORMAL HIGH (ref 0.50–1.10)
GFR calc non Af Amer: 12 mL/min — ABNORMAL LOW (ref >90)
GFR, EST AFRICAN AMERICAN: 14 mL/min — AB (ref >90)
GLUCOSE: 299 mg/dL — AB (ref 70–99)
Potassium: 3.7 mmol/L (ref 3.5–5.1)
Sodium: 135 mmol/L (ref 135–145)
TOTAL PROTEIN: 7 g/dL (ref 6.0–8.3)
Total Bilirubin: 0.4 mg/dL (ref 0.3–1.2)

## 2014-05-09 LAB — I-STAT CG4 LACTIC ACID, ED: Lactic Acid, Venous: 2.59 mmol/L (ref 0.5–2.0)

## 2014-05-09 LAB — GLUCOSE, CAPILLARY: Glucose-Capillary: 212 mg/dL — ABNORMAL HIGH (ref 70–99)

## 2014-05-09 LAB — TSH: TSH: 1.242 u[IU]/mL (ref 0.350–4.500)

## 2014-05-09 MED ORDER — HEPARIN SODIUM (PORCINE) 5000 UNIT/ML IJ SOLN
5000.0000 [IU] | Freq: Three times a day (TID) | INTRAMUSCULAR | Status: DC
  Administered 2014-05-09 – 2014-05-13 (×11): 5000 [IU] via SUBCUTANEOUS
  Filled 2014-05-09 (×13): qty 1

## 2014-05-09 MED ORDER — SEVELAMER CARBONATE 800 MG PO TABS
800.0000 mg | ORAL_TABLET | Freq: Three times a day (TID) | ORAL | Status: DC
  Administered 2014-05-10 – 2014-05-12 (×7): 800 mg via ORAL
  Filled 2014-05-09 (×12): qty 1

## 2014-05-09 MED ORDER — SACCHAROMYCES BOULARDII 250 MG PO CAPS
250.0000 mg | ORAL_CAPSULE | Freq: Two times a day (BID) | ORAL | Status: DC
  Administered 2014-05-09 – 2014-05-12 (×7): 250 mg via ORAL
  Filled 2014-05-09 (×9): qty 1

## 2014-05-09 MED ORDER — PIPERACILLIN-TAZOBACTAM 3.375 G IVPB 30 MIN
3.3750 g | Freq: Once | INTRAVENOUS | Status: AC
  Administered 2014-05-09: 3.375 g via INTRAVENOUS
  Filled 2014-05-09: qty 50

## 2014-05-09 MED ORDER — VANCOMYCIN HCL IN DEXTROSE 750-5 MG/150ML-% IV SOLN
750.0000 mg | Freq: Once | INTRAVENOUS | Status: AC
  Administered 2014-05-09: 750 mg via INTRAVENOUS
  Filled 2014-05-09: qty 150

## 2014-05-09 MED ORDER — CARVEDILOL 6.25 MG PO TABS
6.2500 mg | ORAL_TABLET | Freq: Two times a day (BID) | ORAL | Status: DC
  Administered 2014-05-09 – 2014-05-12 (×4): 6.25 mg via ORAL
  Filled 2014-05-09 (×10): qty 1

## 2014-05-09 MED ORDER — INSULIN GLARGINE 100 UNIT/ML ~~LOC~~ SOLN
7.0000 [IU] | Freq: Every day | SUBCUTANEOUS | Status: DC
  Administered 2014-05-09 – 2014-05-11 (×2): 7 [IU] via SUBCUTANEOUS
  Filled 2014-05-09 (×5): qty 0.07

## 2014-05-09 MED ORDER — INSULIN ASPART 100 UNIT/ML ~~LOC~~ SOLN
0.0000 [IU] | Freq: Three times a day (TID) | SUBCUTANEOUS | Status: DC
  Administered 2014-05-11: 3 [IU] via SUBCUTANEOUS

## 2014-05-09 MED ORDER — PIPERACILLIN-TAZOBACTAM IN DEX 2-0.25 GM/50ML IV SOLN
2.2500 g | Freq: Three times a day (TID) | INTRAVENOUS | Status: DC
  Administered 2014-05-10: 2.25 g via INTRAVENOUS
  Filled 2014-05-09 (×5): qty 50

## 2014-05-09 MED ORDER — PRAVASTATIN SODIUM 20 MG PO TABS
20.0000 mg | ORAL_TABLET | Freq: Every day | ORAL | Status: DC
  Administered 2014-05-09 – 2014-05-13 (×5): 20 mg via ORAL
  Filled 2014-05-09 (×5): qty 1

## 2014-05-09 MED ORDER — VANCOMYCIN HCL 500 MG IV SOLR
500.0000 mg | INTRAVENOUS | Status: DC
  Filled 2014-05-09: qty 500

## 2014-05-09 MED ORDER — VANCOMYCIN HCL 500 MG IV SOLR
500.0000 mg | INTRAVENOUS | Status: DC
  Administered 2014-05-10: 500 mg via INTRAVENOUS
  Filled 2014-05-09: qty 500

## 2014-05-09 MED ORDER — PRO-STAT SUGAR FREE PO LIQD
30.0000 mL | Freq: Three times a day (TID) | ORAL | Status: DC
  Administered 2014-05-10 – 2014-05-12 (×6): 30 mL via ORAL
  Filled 2014-05-09 (×9): qty 30

## 2014-05-09 NOTE — Progress Notes (Signed)
ANTIBIOTIC CONSULT NOTE - INITIAL  Pharmacy Consult for Vancomycin and Zosyn Indication: cellulitis  No Known Allergies  Patient Measurements:   Actual Body Weight: 38.1 kg  Vital Signs: Temp: 98.6 F (37 C) (03/18 1537) Temp Source: Oral (03/18 1537) BP: 120/66 mmHg (03/18 1537) Pulse Rate: 85 (03/18 1537) Intake/Output from previous day:   Intake/Output from this shift:    Labs:  Recent Labs  05/09/14 1640  WBC 8.5  HGB 11.4*  PLT 252   CrCl cannot be calculated (Patient has no serum creatinine result on file.). No results for input(s): VANCOTROUGH, VANCOPEAK, VANCORANDOM, GENTTROUGH, GENTPEAK, GENTRANDOM, TOBRATROUGH, TOBRAPEAK, TOBRARND, AMIKACINPEAK, AMIKACINTROU, AMIKACIN in the last 72 hours.   Microbiology: No results found for this or any previous visit (from the past 720 hour(s)).  Medical History: Past Medical History  Diagnosis Date  . Hyperlipidemia   . Urinary retention     DX NEUROGENIC BLADDER--HAS INDWELLING FOLEY CATHETER  . Arthritis   . Anemia   . CAD (coronary artery disease)   . Neuropathic pain   . Stroke     patient not aware.  . Foley catheter in place     for urinary retention  . Critical lower limb ischemia   . Hypertension   . Peripheral vascular disease   . Hypoglycemia 08/19/2013  . Hypokalemia 08/19/2013  . Diabetes mellitus     TAKES INSULIN   . ESRD (end stage renal disease)     [address] T, TH, S    Medications:   (Not in a hospital admission) Scheduled:   Infusions:  . piperacillin-tazobactam 3.375 g (05/09/14 1750)  . vancomycin     Assessment: 69yo female with history of ESRD on HD TTS presents with left foot infection. Pharmacy is consulted to dose vancomycin and zosyn for cellulitis. Pt is afebrile, WBC wnl, sCr 3.5, LA 2.6. Pt did have HD 3/17.  Pt received zosyn 3.375g IV once in ED.  Goal of Therapy:  Vancomycin pre-HD target level 15-25 mcg/ml  Plan:  Vancomycin 750mg  IV once followed by  500mg  qHD Zosyn 2.25g IV q8h Expected duration 10 days with resolution of temperature and/or normalization of WBC Measure antibiotic drug levels at steady state Follow up culture results, HD plans, and clinical course  Arlean Hopping. Newman Pies, PharmD Clinical Pharmacist Pager [phone] 05/09/2014,5:31 PM

## 2014-05-09 NOTE — ED Notes (Addendum)
Pt arrived from [facility] by [facility]. Pt has wound to left foot that doctor at facility stated that it was infected and wanted to send pt to ED for evaluation. Pt stated that foot aches a little bit rating pain 2/10. Pt is also a tuesday, thursday, Saturday dialysis pt.

## 2014-05-09 NOTE — H&P (Signed)
History and Physical  Elizabeth [name] WUJ:[id number] DOB: 06-Aug-1945 DOA: 05/09/2014  Referring physician: EDP PCP: [name], MD   Chief Complaint:   HPI: Elizabeth [name] is a 69 y.o. female    hx of HL, ESRD on HD (last dialysis was yesterday), here presenting with left foot infection. She had a left second toe gangrene in February and had an amputation by Dr. Darrick Penna. She is currently at rehabilitation Center. She has been followed with wound care and her doctor saw her wound today and felt that it was infected and was sent here for evaluation. Denies any fevers or chills, no chest pain, no sob. She reported still make some urine, does have a indwelling Foley for neurogenic bladder/urinary retention that is chronic. Last dialysis was yesterday.  ED course, left foot x ray showed Acute osteomyelitis involving in the residual 2nd metatarsal, 3rd metatarsal head, and 3rd proximal phalanx.Associated pathologic fracture of the 3rd proximal phalanx. she is started on vanc/zosyn, vascular surgery called, they will see Elizabeth [name] tonight, hospitalist called for admission.  Review of Systems:  Detail per HPI, Review of systems are otherwise negative  Past Medical History  Diagnosis Date  . Hyperlipidemia   . Urinary retention     DX NEUROGENIC BLADDER--HAS INDWELLING FOLEY CATHETER  . Arthritis   . Anemia   . CAD (coronary artery disease)   . Neuropathic pain   . Stroke     Elizabeth [name] not aware.  . Foley catheter in place     for urinary retention  . Critical lower limb ischemia   . Hypertension   . Peripheral vascular disease   . Hypoglycemia 08/19/2013  . Hypokalemia 08/19/2013  . Diabetes mellitus     TAKES INSULIN   . ESRD (end stage renal disease)     [address] T, TH, S   Past Surgical History  Procedure Laterality Date  . Insertion of suprapubic catheter N/A 05/16/2012    Procedure: INSERTION OF SUPRAPUBIC CATHETER;  Surgeon: Sebastian Ache, MD;  Location: [facility] ORS;  Service:  Urology;  Laterality: N/A;  . Bascilic vein transposition Left 10/30/2012    Procedure: LEFT 1ST STAGE BASCILIC VEIN TRANSPOSITION;  Surgeon: Chuck Hint, MD;  Location: [facility] OR;  Service: Vascular;  Laterality: Left;  . Nm myocar perf wall motion  04/19/2012    low risk study-EF 44%,mild inferolateral hypkinesis  . Bascilic vein transposition Left 02/05/2013    Procedure: BASCILIC VEIN TRANSPOSITION 2ND STAGE;  Surgeon: Chuck Hint, MD;  Location: [facility] OR;  Service: Vascular;  Laterality: Left;  . Radiology with anesthesia N/A 10/25/2013    Procedure: RADIOLOGY WITH ANESTHESIA;  Surgeon: Medication Radiologist, MD;  Location: [facility] OR;  Service: Radiology;  Laterality: N/A;  . Fistulogram Left 04/29/2013    Procedure: FISTULOGRAM;  Surgeon: Chuck Hint, MD;  Location: [facility] CATH LAB;  Service: Cardiovascular;  Laterality: Left;  . Angioplasty Left 04/29/2013    Procedure: ANGIOPLASTY;  Surgeon: Chuck Hint, MD;  Location: [facility] CATH LAB;  Service: Cardiovascular;  Laterality: Left;  . Abdominal aortagram N/A 03/19/2014    Procedure: ABDOMINAL Ronny Flurry;  Surgeon: Sherren Kerns, MD;  Location: [facility] CATH LAB;  Service: Cardiovascular;  Laterality: N/A;  . Left and right heart catheterization with coronary angiogram N/A 03/24/2014    Procedure: LEFT AND RIGHT HEART CATHETERIZATION WITH CORONARY ANGIOGRAM;  Surgeon: Marykay Lex, MD;  Location: [facility] CATH LAB;  Service: Cardiovascular;  Laterality: N/A;  . Endarterectomy popliteal Left 03/26/2014    Procedure: Left  popliteal and peroneal artery endarterectomy with vein patch angioplasty;  Surgeon: Sherren Kerns, MD;  Location: [facility] OR;  Service: Vascular;  Laterality: Left;  . Amputation Left 03/26/2014    Procedure: AMPUTATION SECOND LEFT TOE;  Surgeon: Sherren Kerns, MD;  Location: [facility] OR;  Service: Vascular;  Laterality: Left;  . Av fistula placement Left 04/28/2014    Procedure: CREATION OF LEFT BRACHIOCEPHALIC  ARTERIOVENOUS  (AV) FISTULA CREATION;  Surgeon: Fransisco Hertz, MD;  Location: [facility] OR;  Service: Vascular;  Laterality: Left;   Social History:  reports that she has never smoked. She has never used smokeless tobacco. She reports that she does not drink alcohol or use illicit drugs. Elizabeth [name] lives at rehab & is able to participate in activities of daily living with walk with a cane.  No Known Allergies  Family History  Problem Relation Age of Onset  . Hyperlipidemia Mother   . Hypertension Mother   . Hodgkin's lymphoma Father   . Heart disease Sister       Prior to Admission medications   Medication Sig Start Date End Date Taking? Authorizing Provider  acetaminophen-codeine (TYLENOL #3) 300-30 MG per tablet Take 1 tablet by mouth every 6 (six) hours as needed for moderate pain. 04/28/14   Fransisco Hertz, MD  Amino Acids-Protein Hydrolys (FEEDING SUPPLEMENT, PRO-STAT SUGAR FREE 64,) LIQD Take 30 mLs by mouth 3 (three) times daily with meals.    Historical Provider, MD  aspirin EC 81 MG tablet Take 81 mg by mouth daily.    Historical Provider, MD  carvedilol (COREG) 6.25 MG tablet take 1 tablet by mouth twice a day 04/17/14   Chrystie Nose, MD  collagenase (SANTYL) ointment Apply 1 application topically daily as needed (wound care).    Historical Provider, MD  docusate sodium (COLACE) 100 MG capsule Take 1 capsule (100 mg total) by mouth daily. 03/28/14   Leana Roe Elgergawy, MD  glucose blood (ONETOUCH VERIO) test strip Use as instructed to check blood sugar 4 times per day dx code E13.10 02/17/14   [name], MD  insulin aspart (NOVOLOG) 100 UNIT/ML injection Inject 0-15 Units into the skin 3 (three) times daily with meals. Elizabeth [name] taking differently: Inject 3 Units into the skin 3 (three) times daily as needed for high blood sugar (CBG >150).  03/28/14   Leana Roe Elgergawy, MD  insulin glargine (LANTUS) 100 UNIT/ML injection Inject 0.05 mLs (5 Units total) into the skin daily. Elizabeth [name] taking differently: Inject 7  Units into the skin daily at 10 pm.  03/28/14   Starleen Arms, MD  loratadine (CLARITIN) 10 MG tablet Take 10 mg by mouth daily as needed for allergies.    Historical Provider, MD  Multiple Vitamins-Minerals (DECUBI-VITE) CAPS Take 2 capsules by mouth daily.    Historical Provider, MD  NUTRITIONAL SUPPLEMENT LIQD Take 120 mLs by mouth 3 (three) times daily. 2 Calorie supplement    Historical Provider, MD  Nutritional Supplements (FEEDING SUPPLEMENT, NEPRO CARB STEADY,) LIQD Take 237 mLs by mouth 3 (three) times daily between meals. Elizabeth [name] not taking: Reported on 04/25/2014 12/28/13   Ripudeep Jenna Luo, MD  [facility] DELICA LANCETS FINE MISC Use to check blood sugar 4 times per day 09/16/13   [name], MD  oxyCODONE-acetaminophen (PERCOCET/ROXICET) 5-325 MG per tablet Take 1 tablet by mouth every 8 (eight) hours as needed for moderate pain or severe pain. 03/28/14   Leana Roe Elgergawy, MD  pravastatin (PRAVACHOL) 20 MG tablet Take 20  mg by mouth daily.    Historical Provider, MD  promethazine (PHENERGAN) 12.5 MG tablet Take 1 tablet (12.5 mg total) by mouth every 6 (six) hours as needed for nausea or vomiting. 12/28/13   Ripudeep Jenna Luo, MD  saccharomyces boulardii (FLORASTOR) 250 MG capsule Take 1 capsule (250 mg total) by mouth 2 (two) times daily. Elizabeth [name] not taking: Reported on 04/25/2014 03/28/14   Starleen Arms, MD  sevelamer carbonate (RENVELA) 800 MG tablet Take 1 tablet (800 mg total) by mouth 3 (three) times daily with meals. 12/28/13   Ripudeep Jenna Luo, MD    Physical Exam: BP 124/67 mmHg  Pulse 88  Temp(Src) 98.6 F (37 C) (Oral)  Resp 18  SpO2 99%  General:  AAOx3, NAD, thin Eyes: PERRL ENT: unremarkable Neck: supple, no JVD Cardiovascular: RRR Respiratory: CTABL, diminished at bases. Abdomen: soft/ND/ND, positive bowel sounds Skin: no rash Musculoskeletal:  Left foot erythema with purulent discharge, foul smelling, edema, decrease sensation, does has palpable pulse but  diminished Psychiatric: calm/cooperative Neurologic: aaox3, nonfocal          Labs on Admission:  Basic Metabolic Panel:  Recent Labs Lab 05/09/14 1640  NA 135  K 3.7  CL 93*  CO2 30  GLUCOSE 299*  BUN 43*  CREATININE 3.49*  CALCIUM 8.4   Liver Function Tests:  Recent Labs Lab 05/09/14 1640  AST 14  ALT 11  ALKPHOS 152*  BILITOT 0.4  PROT 7.0  ALBUMIN 2.1*   No results for input(s): LIPASE, AMYLASE in the last 168 hours. No results for input(s): AMMONIA in the last 168 hours. CBC:  Recent Labs Lab 05/09/14 1640  WBC 8.5  NEUTROABS 6.7  HGB 11.4*  HCT 37.1  MCV 87.1  PLT 252   Cardiac Enzymes: No results for input(s): CKTOTAL, CKMB, CKMBINDEX, TROPONINI in the last 168 hours.  BNP (last 3 results) No results for input(s): BNP in the last 8760 hours.  ProBNP (last 3 results) No results for input(s): PROBNP in the last 8760 hours.  CBG: No results for input(s): GLUCAP in the last 168 hours.  Radiological Exams on Admission: Dg Chest 2 View  05/09/2014   CLINICAL DATA:  Cough.  EXAM: CHEST - 2 VIEW  COMPARISON:  Two-view chest 04/28/2014.  FINDINGS: The heart is mildly enlarged. A right IJ dialysis catheter is in place. Aeration is improved. Effusions are near completely resolved. There is no significant edema.  IMPRESSION: 1. Stable mild cardiomegaly. 2. Decreased pleural effusions and improved aeration.   Electronically Signed   By: Marin Roberts M.D.   On: 05/09/2014 17:18   Dg Foot Complete Left  05/09/2014   CLINICAL DATA:  Pain/lacerations on left foot  EXAM: LEFT FOOT - COMPLETE 3+ VIEW  COMPARISON:  MRI left foot dated 03/17/2014  FINDINGS: Status post mid tarsal amputation of the 2nd digit. Bony sequestrum involving the residual 2nd metatarsal, indicating residual osteomyelitis. Associated soft tissue swelling with subcutaneous gas.  Additional cortical irregularity involving the medial aspect of the 3rd metatarsal head, indicating acute  osteomyelitis.  Additional cortical irregularity involving the base/proximal shaft of the 3rd proximal phalanx. Associated pathologic fracture.  IMPRESSION: Acute osteomyelitis involving in the residual 2nd metatarsal, 3rd metatarsal head, and 3rd proximal phalanx.  Associated pathologic fracture of the 3rd proximal phalanx.   Electronically Signed   By: Charline Bills M.D.   On: 05/09/2014 17:24      Assessment/Plan Present on Admission:  . Osteomyelitis  1. Osteomyelitis left foot: --  no fever, no leukocytosis, vital stable, does has elevated lactic acid, continue  Vanc/zosyn --vascular surgery to see Elizabeth [name] tonight. Elizabeth [name] had preop cardiac eval for recent surgery in 2016, currently no chest pain, no sob, euvolemic. Hold asa. --recent lower extremity venous duplex in 02/2014 , no DVT.  2. ESRD on hemodialysis. Nephrology consulted for continuation of hemodialysis.  3. Insulin dependent Diabetes mellitus. a1c pending, continue insulin.  4. Essential hypertension. Continue carvedilol.   DVT prophylaxis: heparin subQ  Consultants:  Nephrology/vascualr surgery consulted  Code Status: full  Family Communication:  Elizabeth [name]  Disposition Plan: admit to med-surg  Time spent:  Loreen Bankson MD, PhD [facility] Pager [phone] If 7PM-7AM, please contact night-coverage at [email or web address], password [facility]

## 2014-05-09 NOTE — ED Notes (Signed)
Dr. Silverio Lay was notified of the patients 2.59 I-Stat CG4 Lactic Acid.

## 2014-05-09 NOTE — ED Provider Notes (Signed)
CSN: [id number]     Arrival date & time 05/09/14  1534 History   First MD Initiated Contact with Patient 05/09/14 1603     Chief Complaint  Patient presents with  . Wound Infection     (Consider location/radiation/quality/duration/timing/severity/associated sxs/prior Treatment) The history is provided by the patient.  Elizabeth [name] is a 69 y.o. female hx of HL, ESRD on HD (last dialysis was yesterday), here presenting with left foot infection. She had a left second toe gangrene in February and had an amputation by Dr. Darrick Penna. She is currently at rehabilitation Center. She has been followed with wound care and her doctor saw her wound today and felt that it was infected and was sent here for evaluation. Denies any fevers or chills but she does have some nonproductive cough. She does have a indwelling Foley that is chronic. Last dialysis was yesterday.   Past Medical History  Diagnosis Date  . Hyperlipidemia   . Urinary retention     DX NEUROGENIC BLADDER--HAS INDWELLING FOLEY CATHETER  . Arthritis   . Anemia   . CAD (coronary artery disease)   . Neuropathic pain   . Stroke     patient not aware.  . Foley catheter in place     for urinary retention  . Critical lower limb ischemia   . Hypertension   . Peripheral vascular disease   . Hypoglycemia 08/19/2013  . Hypokalemia 08/19/2013  . Diabetes mellitus     TAKES INSULIN   . ESRD (end stage renal disease)     [address] T, TH, S   Past Surgical History  Procedure Laterality Date  . Insertion of suprapubic catheter N/A 05/16/2012    Procedure: INSERTION OF SUPRAPUBIC CATHETER;  Surgeon: Sebastian Ache, MD;  Location: [facility] ORS;  Service: Urology;  Laterality: N/A;  . Bascilic vein transposition Left 10/30/2012    Procedure: LEFT 1ST STAGE BASCILIC VEIN TRANSPOSITION;  Surgeon: Chuck Hint, MD;  Location: [facility] OR;  Service: Vascular;  Laterality: Left;  . Nm myocar perf wall motion  04/19/2012    low risk study-EF  44%,mild inferolateral hypkinesis  . Bascilic vein transposition Left 02/05/2013    Procedure: BASCILIC VEIN TRANSPOSITION 2ND STAGE;  Surgeon: Chuck Hint, MD;  Location: [facility] OR;  Service: Vascular;  Laterality: Left;  . Radiology with anesthesia N/A 10/25/2013    Procedure: RADIOLOGY WITH ANESTHESIA;  Surgeon: Medication Radiologist, MD;  Location: [facility] OR;  Service: Radiology;  Laterality: N/A;  . Fistulogram Left 04/29/2013    Procedure: FISTULOGRAM;  Surgeon: Chuck Hint, MD;  Location: [facility] CATH LAB;  Service: Cardiovascular;  Laterality: Left;  . Angioplasty Left 04/29/2013    Procedure: ANGIOPLASTY;  Surgeon: Chuck Hint, MD;  Location: [facility] CATH LAB;  Service: Cardiovascular;  Laterality: Left;  . Abdominal aortagram N/A 03/19/2014    Procedure: ABDOMINAL Ronny Flurry;  Surgeon: Sherren Kerns, MD;  Location: [facility] CATH LAB;  Service: Cardiovascular;  Laterality: N/A;  . Left and right heart catheterization with coronary angiogram N/A 03/24/2014    Procedure: LEFT AND RIGHT HEART CATHETERIZATION WITH CORONARY ANGIOGRAM;  Surgeon: Marykay Lex, MD;  Location: [facility] CATH LAB;  Service: Cardiovascular;  Laterality: N/A;  . Endarterectomy popliteal Left 03/26/2014    Procedure: Left popliteal and peroneal artery endarterectomy with vein patch angioplasty;  Surgeon: Sherren Kerns, MD;  Location: [facility] OR;  Service: Vascular;  Laterality: Left;  . Amputation Left 03/26/2014    Procedure: AMPUTATION SECOND LEFT TOE;  Surgeon: Leonette Most  Holland Commons, MD;  Location: [facility] OR;  Service: Vascular;  Laterality: Left;  . Av fistula placement Left 04/28/2014    Procedure: CREATION OF LEFT BRACHIOCEPHALIC  ARTERIOVENOUS (AV) FISTULA CREATION;  Surgeon: Fransisco Hertz, MD;  Location: [facility] OR;  Service: Vascular;  Laterality: Left;   Family History  Problem Relation Age of Onset  . Hyperlipidemia Mother   . Hypertension Mother   . Hodgkin's lymphoma Father   . Heart disease Sister    History  Substance  Use Topics  . Smoking status: Never Smoker   . Smokeless tobacco: Never Used  . Alcohol Use: No   OB History    No data available     Review of Systems  Musculoskeletal:       L foot pain and discharge   All other systems reviewed and are negative.     Allergies  Review of patient's allergies indicates no known allergies.  Home Medications   Prior to Admission medications   Medication Sig Start Date End Date Taking? Authorizing Provider  acetaminophen-codeine (TYLENOL #3) 300-30 MG per tablet Take 1 tablet by mouth every 6 (six) hours as needed for moderate pain. 04/28/14   Fransisco Hertz, MD  Amino Acids-Protein Hydrolys (FEEDING SUPPLEMENT, PRO-STAT SUGAR FREE 64,) LIQD Take 30 mLs by mouth 3 (three) times daily with meals.    Historical Provider, MD  aspirin EC 81 MG tablet Take 81 mg by mouth daily.    Historical Provider, MD  carvedilol (COREG) 6.25 MG tablet take 1 tablet by mouth twice a day 04/17/14   Chrystie Nose, MD  collagenase (SANTYL) ointment Apply 1 application topically daily as needed (wound care).    Historical Provider, MD  docusate sodium (COLACE) 100 MG capsule Take 1 capsule (100 mg total) by mouth daily. 03/28/14   Leana Roe Elgergawy, MD  glucose blood (ONETOUCH VERIO) test strip Use as instructed to check blood sugar 4 times per day dx code E13.10 02/17/14   [name], MD  insulin aspart (NOVOLOG) 100 UNIT/ML injection Inject 0-15 Units into the skin 3 (three) times daily with meals. Patient taking differently: Inject 3 Units into the skin 3 (three) times daily as needed for high blood sugar (CBG >150).  03/28/14   Leana Roe Elgergawy, MD  insulin glargine (LANTUS) 100 UNIT/ML injection Inject 0.05 mLs (5 Units total) into the skin daily. Patient taking differently: Inject 7 Units into the skin daily at 10 pm.  03/28/14   Starleen Arms, MD  loratadine (CLARITIN) 10 MG tablet Take 10 mg by mouth daily as needed for allergies.    Historical Provider, MD   Multiple Vitamins-Minerals (DECUBI-VITE) CAPS Take 2 capsules by mouth daily.    Historical Provider, MD  NUTRITIONAL SUPPLEMENT LIQD Take 120 mLs by mouth 3 (three) times daily. 2 Calorie supplement    Historical Provider, MD  Nutritional Supplements (FEEDING SUPPLEMENT, NEPRO CARB STEADY,) LIQD Take 237 mLs by mouth 3 (three) times daily between meals. Patient not taking: Reported on 04/25/2014 12/28/13   Ripudeep Jenna Luo, MD  [facility] DELICA LANCETS FINE MISC Use to check blood sugar 4 times per day 09/16/13   [name], MD  oxyCODONE-acetaminophen (PERCOCET/ROXICET) 5-325 MG per tablet Take 1 tablet by mouth every 8 (eight) hours as needed for moderate pain or severe pain. 03/28/14   Leana Roe Elgergawy, MD  pravastatin (PRAVACHOL) 20 MG tablet Take 20 mg by mouth daily.    Historical Provider, MD  promethazine (PHENERGAN) 12.5  MG tablet Take 1 tablet (12.5 mg total) by mouth every 6 (six) hours as needed for nausea or vomiting. 12/28/13   Ripudeep Jenna Luo, MD  saccharomyces boulardii (FLORASTOR) 250 MG capsule Take 1 capsule (250 mg total) by mouth 2 (two) times daily. Patient not taking: Reported on 04/25/2014 03/28/14   Starleen Arms, MD  sevelamer carbonate (RENVELA) 800 MG tablet Take 1 tablet (800 mg total) by mouth 3 (three) times daily with meals. 12/28/13   Ripudeep K Rai, MD   BP 120/66 mmHg  Pulse 85  Temp(Src) 98.6 F (37 C) (Oral)  Resp 18  SpO2 97% Physical Exam  Constitutional: She is oriented to person, place, and time.  Chronically ill, NAD   HENT:  Head: Normocephalic.  Mouth/Throat: Oropharynx is clear and moist.  Eyes: Conjunctivae are normal. Pupils are equal, round, and reactive to light.  Neck: Normal range of motion. Neck supple.  Cardiovascular: Normal rate, regular rhythm and normal heart sounds.   Pulmonary/Chest: Effort normal and breath sounds normal. No respiratory distress. She has no wheezes. She has no rales.  Abdominal: Soft. Bowel sounds are normal. She  exhibits no distension. There is no tenderness. There is no rebound.  Musculoskeletal:  l foot second toe with purulent discharge, foul smelling.   Neurological: She is alert and oriented to person, place, and time.  Skin: Skin is warm and dry.  Psychiatric: She has a normal mood and affect. Her behavior is normal. Judgment and thought content normal.  Nursing note and vitals reviewed.   ED Course  Procedures (including critical care time) Labs Review Labs Reviewed  CBC WITH DIFFERENTIAL/PLATELET - Abnormal; Notable for the following:    Hemoglobin 11.4 (*)    RDW 20.6 (*)    Neutrophils Relative % 79 (*)    Lymphocytes Relative 9 (*)    All other components within normal limits  COMPREHENSIVE METABOLIC PANEL - Abnormal; Notable for the following:    Chloride 93 (*)    Glucose, Bld 299 (*)    BUN 43 (*)    Creatinine, Ser 3.49 (*)    Albumin 2.1 (*)    Alkaline Phosphatase 152 (*)    GFR calc non Af Amer 12 (*)    GFR calc Af Amer 14 (*)    All other components within normal limits  I-STAT CG4 LACTIC ACID, ED - Abnormal; Notable for the following:    Lactic Acid, Venous 2.59 (*)    All other components within normal limits  CULTURE, BLOOD (ROUTINE X 2)  CULTURE, BLOOD (ROUTINE X 2)    Imaging Review Dg Chest 2 View  05/09/2014   CLINICAL DATA:  Cough.  EXAM: CHEST - 2 VIEW  COMPARISON:  Two-view chest 04/28/2014.  FINDINGS: The heart is mildly enlarged. A right IJ dialysis catheter is in place. Aeration is improved. Effusions are near completely resolved. There is no significant edema.  IMPRESSION: 1. Stable mild cardiomegaly. 2. Decreased pleural effusions and improved aeration.   Electronically Signed   By: Marin Roberts M.D.   On: 05/09/2014 17:18   Dg Foot Complete Left  05/09/2014   CLINICAL DATA:  Pain/lacerations on left foot  EXAM: LEFT FOOT - COMPLETE 3+ VIEW  COMPARISON:  MRI left foot dated 03/17/2014  FINDINGS: Status post mid tarsal amputation of the 2nd  digit. Bony sequestrum involving the residual 2nd metatarsal, indicating residual osteomyelitis. Associated soft tissue swelling with subcutaneous gas.  Additional cortical irregularity involving the medial aspect of the  3rd metatarsal head, indicating acute osteomyelitis.  Additional cortical irregularity involving the base/proximal shaft of the 3rd proximal phalanx. Associated pathologic fracture.  IMPRESSION: Acute osteomyelitis involving in the residual 2nd metatarsal, 3rd metatarsal head, and 3rd proximal phalanx.  Associated pathologic fracture of the 3rd proximal phalanx.   Electronically Signed   By: Charline Bills M.D.   On: 05/09/2014 17:24     EKG Interpretation None      MDM   Final diagnoses:  Foot infection   Elizabeth [name] is a 69 y.o. female here with l foot purulent discharge. Need to r/o osteo. Will consult vascular for I&D vs further amputation.   6:01 PM Xray showed 2nd and 3rd toe osteo. WBC nl. Lactate slightly elevated. Given vanc/zosyn. Called Dr. Arbie Cookey, who will see patient. Will admit to medicine for IV abx.    Richardean Canal, MD 05/09/14 [phone]

## 2014-05-09 NOTE — Consult Note (Signed)
[facility] Consult Note    Chief Complaint: Wound infection Lt 2nd toe amputation site  Reason for consult: To manage dialysis and dialysis related needs  Assessment: 1. Infected lt 2nd toe amputation site - appears to be infected and wound not healing properly with mild erythema as well. Also with a dry ulcer at the 1st MTP joint site. I wonder if she's getting enough perfusion pressure to heal these wounds. 2. ESRD - Last HD was on Thursday for a full treatment. No acute indication for HD tonight. HD at [facility], EDW 36.5kg, Heparin 3700, Hectorol , Aranesp qweekly, t= 3.5hr 3. Anemia - Transfuse as needed. Would not escalate the Aranesp as she would be unlikely to respond with the ongoing inflammation. 4. Metabolic bone disease - On Hectorol qtreatment 5. HTN - Fairly well controlled 6. Volume - close to EDW. Very frail - nutritional status poor.  Plan 1. Will plan on dialysis tomorrow with no heparin in case she goes for a debridement. 2. Would reconsult Vascular to determine whether she is getting enough distal flow to heal the wounds.  3. No escalation in Aranesp with the ongoing inflammation; transfuse if needed. 4. Nutrition supplement - Nepro BID. She needs more caloric intake.  HPI:  Elizabeth [name] is a pleasant 69 y.o. female who dialyzes at [facility] with Dr. Arrie Aran with a recent left 2nd toe amputation on 2/16 now being referred with a suspected infection and found to have acute osteomyelitis of the residual 2nd metatarsal, 3rd metatarsal head and 3rd proximal phalanx. She denies any fevers but has had some congestion, nonproductive cough and weakness. 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    Review of Systems  Constitutional: Positive for weight loss and malaise/fatigue. Negative for fever and chills.  HENT: Negative for congestion and sore throat.   Eyes:  Negative for blurred vision.  Respiratory: Positive for cough. Negative for hemoptysis and sputum production.   Cardiovascular: Negative for chest pain, palpitations and orthopnea.  Gastrointestinal: Positive for nausea. Negative for heartburn, vomiting, abdominal pain and diarrhea.  Genitourinary: Negative for dysuria, urgency and flank pain.  Musculoskeletal: Negative for myalgias and back pain.  Skin: Negative for rash.  Neurological: Negative for dizziness, tingling, seizures, loss of consciousness and headaches.  Endo/Heme/Allergies: Does not bruise/bleed easily.  Psychiatric/Behavioral: Negative for depression.     Past Medical History  Past Medical History  Diagnosis Date  . Hyperlipidemia   . Urinary retention     DX NEUROGENIC BLADDER--HAS INDWELLING FOLEY CATHETER  . Arthritis   . Anemia   . CAD (coronary artery disease)   . Neuropathic pain   . Stroke     patient not aware.  . Foley catheter in place     for urinary retention  . Critical lower limb ischemia   . Hypertension   . Peripheral vascular disease   . Hypoglycemia 08/19/2013  . Hypokalemia 08/19/2013  . Diabetes mellitus     TAKES INSULIN   . ESRD (end stage renal disease)     [address] T, TH, S   Past Surgical History  Past Surgical History  Procedure Laterality Date  . Insertion of suprapubic catheter N/A 05/16/2012    Procedure: INSERTION OF SUPRAPUBIC CATHETER;  Surgeon: Sebastian Ache, MD;  Location: [facility] ORS;  Service: Urology;  Laterality: N/A;  . Bascilic vein transposition Left 10/30/2012    Procedure: LEFT 1ST STAGE BASCILIC VEIN TRANSPOSITION;  Surgeon: Chuck Hint, MD;  Location: [facility] OR;  Service: Vascular;  Laterality: Left;  . Nm myocar perf wall motion  04/19/2012    low risk study-EF 44%,mild inferolateral hypkinesis  . Bascilic vein transposition Left 02/05/2013    Procedure: BASCILIC VEIN TRANSPOSITION 2ND STAGE;  Surgeon: Chuck Hint, MD;  Location: [facility] OR;   Service: Vascular;  Laterality: Left;  . Radiology with anesthesia N/A 10/25/2013    Procedure: RADIOLOGY WITH ANESTHESIA;  Surgeon: Medication Radiologist, MD;  Location: [facility] OR;  Service: Radiology;  Laterality: N/A;  . Fistulogram Left 04/29/2013    Procedure: FISTULOGRAM;  Surgeon: Chuck Hint, MD;  Location: [facility] CATH LAB;  Service: Cardiovascular;  Laterality: Left;  . Angioplasty Left 04/29/2013    Procedure: ANGIOPLASTY;  Surgeon: Chuck Hint, MD;  Location: [facility] CATH LAB;  Service: Cardiovascular;  Laterality: Left;  . Abdominal aortagram N/A 03/19/2014    Procedure: ABDOMINAL Ronny Flurry;  Surgeon: Sherren Kerns, MD;  Location: [facility] CATH LAB;  Service: Cardiovascular;  Laterality: N/A;  . Left and right heart catheterization with coronary angiogram N/A 03/24/2014    Procedure: LEFT AND RIGHT HEART CATHETERIZATION WITH CORONARY ANGIOGRAM;  Surgeon: Marykay Lex, MD;  Location: [facility] CATH LAB;  Service: Cardiovascular;  Laterality: N/A;  . Endarterectomy popliteal Left 03/26/2014    Procedure: Left popliteal and peroneal artery endarterectomy with vein patch angioplasty;  Surgeon: Sherren Kerns, MD;  Location: [facility] OR;  Service: Vascular;  Laterality: Left;  . Amputation Left 03/26/2014    Procedure: AMPUTATION SECOND LEFT TOE;  Surgeon: Sherren Kerns, MD;  Location: [facility] OR;  Service: Vascular;  Laterality: Left;  . Av fistula placement Left 04/28/2014    Procedure: CREATION OF LEFT BRACHIOCEPHALIC  ARTERIOVENOUS (AV) FISTULA CREATION;  Surgeon: Fransisco Hertz, MD;  Location: [facility] OR;  Service: Vascular;  Laterality: Left;   Family History  Family History  Problem Relation Age of Onset  . Hyperlipidemia Mother   . Hypertension Mother   . Hodgkin's lymphoma Father   . Heart disease Sister    Social History  reports that she has never smoked. She has never used smokeless tobacco. She reports that she does not drink alcohol or use illicit drugs. Allergies No Known Allergies Home  medications Prior to Admission medications   Medication Sig Start Date End Date Taking? Authorizing Provider  acetaminophen-codeine (TYLENOL #3) 300-30 MG per tablet Take 1 tablet by mouth every 6 (six) hours as needed for moderate pain. 04/28/14   Fransisco Hertz, MD  Amino Acids-Protein Hydrolys (FEEDING SUPPLEMENT, PRO-STAT SUGAR FREE 64,) LIQD Take 30 mLs by mouth 3 (three) times daily with meals.    Historical Provider, MD  aspirin EC 81 MG tablet Take 81 mg by mouth daily.    Historical Provider, MD  carvedilol (COREG) 6.25 MG tablet take 1 tablet by mouth twice a day 04/17/14   Chrystie Nose, MD  collagenase (SANTYL) ointment Apply 1 application topically daily as needed (wound care).    Historical Provider, MD  docusate sodium (COLACE) 100 MG capsule Take 1 capsule (100 mg total) by mouth daily. 03/28/14   Leana Roe Elgergawy, MD  glucose blood (ONETOUCH VERIO) test strip Use as instructed to check blood sugar 4 times per day dx code E13.10 02/17/14   [name], MD  insulin aspart (NOVOLOG) 100 UNIT/ML injection Inject 0-15 Units into the skin 3 (three) times daily with meals. Patient taking differently: Inject 3 Units into the skin 3 (  three) times daily as needed for high blood sugar (CBG >150).  03/28/14   Leana Roe Elgergawy, MD  insulin glargine (LANTUS) 100 UNIT/ML injection Inject 0.05 mLs (5 Units total) into the skin daily. Patient taking differently: Inject 7 Units into the skin daily at 10 pm.  03/28/14   Starleen Arms, MD  loratadine (CLARITIN) 10 MG tablet Take 10 mg by mouth daily as needed for allergies.    Historical Provider, MD  Multiple Vitamins-Minerals (DECUBI-VITE) CAPS Take 2 capsules by mouth daily.    Historical Provider, MD  NUTRITIONAL SUPPLEMENT LIQD Take 120 mLs by mouth 3 (three) times daily. 2 Calorie supplement    Historical Provider, MD  Nutritional Supplements (FEEDING SUPPLEMENT, NEPRO CARB STEADY,) LIQD Take 237 mLs by mouth 3 (three) times daily between  meals. Patient not taking: Reported on 04/25/2014 12/28/13   Ripudeep Jenna Luo, MD  [facility] DELICA LANCETS FINE MISC Use to check blood sugar 4 times per day 09/16/13   [name], MD  oxyCODONE-acetaminophen (PERCOCET/ROXICET) 5-325 MG per tablet Take 1 tablet by mouth every 8 (eight) hours as needed for moderate pain or severe pain. 03/28/14   Leana Roe Elgergawy, MD  pravastatin (PRAVACHOL) 20 MG tablet Take 20 mg by mouth daily.    Historical Provider, MD  promethazine (PHENERGAN) 12.5 MG tablet Take 1 tablet (12.5 mg total) by mouth every 6 (six) hours as needed for nausea or vomiting. 12/28/13   Ripudeep Jenna Luo, MD  saccharomyces boulardii (FLORASTOR) 250 MG capsule Take 1 capsule (250 mg total) by mouth 2 (two) times daily. Patient not taking: Reported on 04/25/2014 03/28/14   Starleen Arms, MD  sevelamer carbonate (RENVELA) 800 MG tablet Take 1 tablet (800 mg total) by mouth 3 (three) times daily with meals. 12/28/13   Ripudeep Jenna Luo, MD    Current facility-administered medications:  .  carvedilol (COREG) tablet 6.25 mg, 6.25 mg, Oral, BID, Albertine Grates, MD .  Melene Muller ON 05/10/2014] feeding supplement (PRO-STAT SUGAR FREE 64) liquid 30 mL, 30 mL, Oral, TID WC, Albertine Grates, MD .  heparin injection 5,000 Units, 5,000 Units, Subcutaneous, 3 times per day, Albertine Grates, MD .  Melene Muller ON 05/10/2014] insulin aspart (novoLOG) injection 0-15 Units, 0-15 Units, Subcutaneous, TID WC, Albertine Grates, MD .  insulin glargine (LANTUS) injection 7 Units, 7 Units, Subcutaneous, Q2200, Albertine Grates, MD .  Melene Muller ON 05/10/2014] piperacillin-tazobactam (ZOSYN) IVPB 2.25 g, 2.25 g, Intravenous, Q8H, Marquita Palms, RPH .  pravastatin (PRAVACHOL) tablet 20 mg, 20 mg, Oral, Daily, Albertine Grates, MD .  saccharomyces boulardii (FLORASTOR) capsule 250 mg, 250 mg, Oral, BID, Albertine Grates, MD .  Melene Muller ON 05/10/2014] sevelamer carbonate (RENVELA) tablet 800 mg, 800 mg, Oral, TID WC, Albertine Grates, MD .  Melene Muller ON 05/10/2014] vancomycin (VANCOCIN) 500 mg in sodium chloride 0.9  % 100 mL IVPB, 500 mg, Intravenous, Q T,Th,Sa-HD, Marquita Palms, [facility] Liver Function Tests  Recent Labs Lab 05/09/14 1640  AST 14  ALT 11  ALKPHOS 152*  BILITOT 0.4  PROT 7.0  ALBUMIN 2.1*   No results for input(s): LIPASE, AMYLASE in the last 168 hours. CBC  Recent Labs Lab 05/09/14 1640  WBC 8.5  NEUTROABS 6.7  HGB 11.4*  HCT 37.1  MCV 87.1  PLT 252   Basic Metabolic Panel  Recent Labs Lab 05/09/14 1640  NA 135  K 3.7  CL 93*  CO2 30  GLUCOSE 299*  BUN 43*  CREATININE 3.49*  CALCIUM 8.4  Physical Exam:  Blood pressure 119/63, pulse 86, temperature 98.7 F (37.1 C), temperature source Oral, resp. rate 14, height  (1.6 m), weight 36.378 kg (80 lb 3.2 oz), SpO2 100 %. Gen: NAD, A&Ox3, pleasant Skin: no rash, cyanosis HEENT:  EOMI, sclera anicteric, throat clear, poor dentition Neck: No JVD, no LAD Chest: CTA b/l, no rales CV: rhythm regular, poor distal pedal pulses  Abdomen: soft, NDNT +BS Ext: No edema,  Left 2nd toe amputation site not healed with drainage, mild erythema, 1st MTP joint dry ulceration Neuro: alert, Ox3, nonfocal  Outpatient HD: [facility]   Hectorol 1ug     Aranesp 200 qweekly     Heparin 3700  T 3.5 hr w 36.5kg  Paulene Floor  MD Work  [phone] 05/09/2014, 8:44 PM

## 2014-05-09 NOTE — Progress Notes (Signed)
New Admission Note:   Arrival Method: Via stretcher from ED Mental Orientation:alert and oriented x4 Telemetry: N/A Assessment: Completed Skin:See docflowsheet IV:NSL-R FA Pain: Denies Tubes:N/A Safety Measures: Safety Fall Prevention Plan has been given, discussed  Admission: Completed [address] Orientation: Patient has been orientated to the room, unit and staff.  Family:None at the moment  Orders have been reviewed and implemented. Will continue to monitor the patient. Call light has been placed within reach and bed alarm has been activated.   [employer], RN-BC Phone number: [phone]

## 2014-05-09 NOTE — ED Notes (Signed)
Patient transported to X-ray 

## 2014-05-10 ENCOUNTER — Inpatient Hospital Stay (HOSPITAL_COMMUNITY): Payer: Medicare Other | Admitting: Certified Registered Nurse Anesthetist

## 2014-05-10 ENCOUNTER — Encounter (HOSPITAL_COMMUNITY)
Admission: EM | Disposition: A | Discharge status: Skilled Nursing Facility | Payer: Self-pay | Source: Home / Self Care | Attending: Internal Medicine

## 2014-05-10 DIAGNOSIS — I70262 Atherosclerosis of native arteries of extremities with gangrene, left leg: Secondary | ICD-10-CM

## 2014-05-10 HISTORY — PX: AMPUTATION: SHX166

## 2014-05-10 LAB — CBC
HCT: 35.5 % — ABNORMAL LOW (ref 36.0–46.0)
HEMOGLOBIN: 10.8 g/dL — AB (ref 12.0–15.0)
MCH: 26.3 pg (ref 26.0–34.0)
MCHC: 30.4 g/dL (ref 30.0–36.0)
MCV: 86.6 fL (ref 78.0–100.0)
PLATELETS: 271 10*3/uL (ref 150–400)
RBC: 4.1 MIL/uL (ref 3.87–5.11)
RDW: 20.3 % — AB (ref 11.5–15.5)
WBC: 7.7 10*3/uL (ref 4.0–10.5)

## 2014-05-10 LAB — BASIC METABOLIC PANEL
ANION GAP: 10 (ref 5–15)
BUN: 48 mg/dL — ABNORMAL HIGH (ref 6–23)
CALCIUM: 8 mg/dL — AB (ref 8.4–10.5)
CO2: 31 mmol/L (ref 19–32)
CREATININE: 3.92 mg/dL — AB (ref 0.50–1.10)
Chloride: 95 mmol/L — ABNORMAL LOW (ref 96–112)
GFR calc Af Amer: 12 mL/min — ABNORMAL LOW (ref >90)
GFR calc non Af Amer: 11 mL/min — ABNORMAL LOW (ref >90)
GLUCOSE: 45 mg/dL — AB (ref 70–99)
POTASSIUM: 3.5 mmol/L (ref 3.5–5.1)
Sodium: 136 mmol/L (ref 135–145)

## 2014-05-10 LAB — GLUCOSE, CAPILLARY
GLUCOSE-CAPILLARY: 110 mg/dL — AB (ref 70–99)
GLUCOSE-CAPILLARY: 69 mg/dL — AB (ref 70–99)
GLUCOSE-CAPILLARY: 69 mg/dL — AB (ref 70–99)
GLUCOSE-CAPILLARY: 96 mg/dL (ref 70–99)
Glucose-Capillary: 46 mg/dL — ABNORMAL LOW (ref 70–99)
Glucose-Capillary: 123 mg/dL — ABNORMAL HIGH (ref 70–99)
Glucose-Capillary: 88 mg/dL (ref 70–99)
Glucose-Capillary: 80 mg/dL (ref 70–99)

## 2014-05-10 LAB — SURGICAL PCR SCREEN
MRSA, PCR: NEGATIVE
Staphylococcus aureus: NEGATIVE

## 2014-05-10 SURGERY — AMPUTATION BELOW KNEE
Anesthesia: General | Site: Leg Lower | Laterality: Left

## 2014-05-10 MED ORDER — FENTANYL CITRATE 0.05 MG/ML IJ SOLN
INTRAMUSCULAR | Status: DC | PRN
  Administered 2014-05-10: 50 ug via INTRAVENOUS

## 2014-05-10 MED ORDER — PENTAFLUOROPROP-TETRAFLUOROETH EX AERO: 1.0000 "application " | INHALATION_SPRAY | CUTANEOUS | Status: DC | PRN

## 2014-05-10 MED ORDER — SODIUM CHLORIDE 0.9 % IV SOLN
INTRAVENOUS | Status: DC | PRN
  Administered 2014-05-10: 11:00:00 via INTRAVENOUS

## 2014-05-10 MED ORDER — DEXTROSE 50 % IV SOLN
INTRAVENOUS | Status: AC
  Administered 2014-05-10: 12.5 g via INTRAVENOUS
  Filled 2014-05-10: qty 50

## 2014-05-10 MED ORDER — ACETAMINOPHEN 325 MG RE SUPP
325.0000 mg | RECTAL | Status: DC | PRN
  Filled 2014-05-10: qty 2

## 2014-05-10 MED ORDER — LIDOCAINE HCL (CARDIAC) 20 MG/ML IV SOLN
INTRAVENOUS | Status: DC | PRN
  Administered 2014-05-10: 60 mg via INTRAVENOUS

## 2014-05-10 MED ORDER — MORPHINE SULFATE 2 MG/ML IJ SOLN
1.0000 mg | INTRAMUSCULAR | Status: DC | PRN
  Administered 2014-05-10 (×2): 2 mg via INTRAVENOUS
  Administered 2014-05-10: 3 mg via INTRAVENOUS
  Administered 2014-05-10: 1 mg via INTRAVENOUS
  Administered 2014-05-11 – 2014-05-12 (×5): 2 mg via INTRAVENOUS
  Filled 2014-05-10: qty 2
  Filled 2014-05-10 (×9): qty 1

## 2014-05-10 MED ORDER — LABETALOL HCL 5 MG/ML IV SOLN
10.0000 mg | INTRAVENOUS | Status: DC | PRN
  Filled 2014-05-10: qty 4

## 2014-05-10 MED ORDER — MAGNESIUM SULFATE 2 GM/50ML IV SOLN
2.0000 g | Freq: Every day | INTRAVENOUS | Status: DC | PRN
  Filled 2014-05-10: qty 50

## 2014-05-10 MED ORDER — NEPRO/CARBSTEADY PO LIQD
237.0000 mL | ORAL | Status: DC | PRN
  Filled 2014-05-10: qty 237

## 2014-05-10 MED ORDER — MIDAZOLAM HCL 2 MG/2ML IJ SOLN
INTRAMUSCULAR | Status: AC
  Filled 2014-05-10: qty 2

## 2014-05-10 MED ORDER — OXYCODONE HCL 5 MG/5ML PO SOLN: 5.0000 mg | Freq: Once | ORAL | Status: DC | PRN

## 2014-05-10 MED ORDER — NEOSTIGMINE METHYLSULFATE 10 MG/10ML IV SOLN
INTRAVENOUS | Status: AC
  Filled 2014-05-10: qty 1

## 2014-05-10 MED ORDER — EPHEDRINE SULFATE 50 MG/ML IJ SOLN
INTRAMUSCULAR | Status: AC
  Filled 2014-05-10: qty 1

## 2014-05-10 MED ORDER — PHENOL 1.4 % MT LIQD
1.0000 | OROMUCOSAL | Status: DC | PRN
  Filled 2014-05-10: qty 177

## 2014-05-10 MED ORDER — PHENYLEPHRINE 40 MCG/ML (10ML) SYRINGE FOR IV PUSH (FOR BLOOD PRESSURE SUPPORT)
PREFILLED_SYRINGE | INTRAVENOUS | Status: AC
  Filled 2014-05-10: qty 20

## 2014-05-10 MED ORDER — FENTANYL CITRATE 0.05 MG/ML IJ SOLN
INTRAMUSCULAR | Status: AC
  Filled 2014-05-10: qty 5

## 2014-05-10 MED ORDER — GUAIFENESIN-DM 100-10 MG/5ML PO SYRP
15.0000 mL | ORAL_SOLUTION | ORAL | Status: DC | PRN
  Filled 2014-05-10: qty 15

## 2014-05-10 MED ORDER — METOPROLOL TARTRATE 1 MG/ML IV SOLN
2.0000 mg | INTRAVENOUS | Status: DC | PRN
  Filled 2014-05-10: qty 5

## 2014-05-10 MED ORDER — ACETAMINOPHEN 325 MG PO TABS: 325.0000 mg | ORAL_TABLET | ORAL | Status: DC | PRN

## 2014-05-10 MED ORDER — ONDANSETRON HCL 4 MG/2ML IJ SOLN
4.0000 mg | Freq: Four times a day (QID) | INTRAMUSCULAR | Status: DC | PRN
  Administered 2014-05-10 – 2014-05-13 (×2): 4 mg via INTRAVENOUS
  Filled 2014-05-10 (×2): qty 2

## 2014-05-10 MED ORDER — FENTANYL CITRATE 0.05 MG/ML IJ SOLN: 25.0000 ug | INTRAMUSCULAR | Status: DC | PRN

## 2014-05-10 MED ORDER — VANCOMYCIN HCL IN DEXTROSE 1-5 GM/200ML-% IV SOLN
INTRAVENOUS | Status: AC
  Filled 2014-05-10: qty 200

## 2014-05-10 MED ORDER — LIDOCAINE HCL (PF) 1 % IJ SOLN: 5.0000 mL | INTRAMUSCULAR | Status: DC | PRN

## 2014-05-10 MED ORDER — ONDANSETRON HCL 4 MG/2ML IJ SOLN
INTRAMUSCULAR | Status: DC | PRN
  Administered 2014-05-10: 4 mg via INTRAVENOUS

## 2014-05-10 MED ORDER — PROPOFOL 10 MG/ML IV BOLUS
INTRAVENOUS | Status: DC | PRN
  Administered 2014-05-10: 110 mg via INTRAVENOUS

## 2014-05-10 MED ORDER — ONDANSETRON HCL 4 MG/2ML IJ SOLN
INTRAMUSCULAR | Status: AC
  Filled 2014-05-10: qty 2

## 2014-05-10 MED ORDER — OXYCODONE HCL 5 MG PO TABS: 5.0000 mg | ORAL_TABLET | Freq: Once | ORAL | Status: DC | PRN

## 2014-05-10 MED ORDER — OXYCODONE-ACETAMINOPHEN 5-325 MG PO TABS
1.0000 | ORAL_TABLET | ORAL | Status: DC | PRN
  Administered 2014-05-10: 2 via ORAL
  Filled 2014-05-10: qty 2

## 2014-05-10 MED ORDER — ONDANSETRON HCL 4 MG/2ML IJ SOLN: 4.0000 mg | Freq: Four times a day (QID) | INTRAMUSCULAR | Status: DC | PRN

## 2014-05-10 MED ORDER — HEPARIN SODIUM (PORCINE) 1000 UNIT/ML DIALYSIS: 1000.0000 [IU] | INTRAMUSCULAR | Status: DC | PRN

## 2014-05-10 MED ORDER — SODIUM CHLORIDE 0.9 % IV SOLN: 100.0000 mL | INTRAVENOUS | Status: DC | PRN

## 2014-05-10 MED ORDER — HYDRALAZINE HCL 20 MG/ML IJ SOLN: 5.0000 mg | INTRAMUSCULAR | Status: DC | PRN

## 2014-05-10 MED ORDER — POTASSIUM CHLORIDE CRYS ER 20 MEQ PO TBCR: 20.0000 meq | EXTENDED_RELEASE_TABLET | Freq: Every day | ORAL | Status: DC | PRN

## 2014-05-10 MED ORDER — LIDOCAINE-PRILOCAINE 2.5-2.5 % EX CREA
1.0000 "application " | TOPICAL_CREAM | CUTANEOUS | Status: DC | PRN
  Filled 2014-05-10: qty 5

## 2014-05-10 MED ORDER — VANCOMYCIN HCL 1000 MG IV SOLR
1000.0000 mg | INTRAVENOUS | Status: DC | PRN
  Administered 2014-05-10: 500 mg via INTRAVENOUS

## 2014-05-10 MED ORDER — ALTEPLASE 2 MG IJ SOLR
2.0000 mg | Freq: Once | INTRAMUSCULAR | Status: DC | PRN
  Filled 2014-05-10: qty 2

## 2014-05-10 MED ORDER — PANTOPRAZOLE SODIUM 40 MG PO TBEC
40.0000 mg | DELAYED_RELEASE_TABLET | Freq: Every day | ORAL | Status: DC
  Administered 2014-05-10 – 2014-05-13 (×4): 40 mg via ORAL
  Filled 2014-05-10 (×4): qty 1

## 2014-05-10 MED ORDER — LIDOCAINE HCL (CARDIAC) 20 MG/ML IV SOLN
INTRAVENOUS | Status: AC
Start: 2014-05-10 — End: 2014-05-10
  Filled 2014-05-10: qty 5

## 2014-05-10 MED ORDER — DOCUSATE SODIUM 100 MG PO CAPS
100.0000 mg | ORAL_CAPSULE | Freq: Every day | ORAL | Status: DC
  Administered 2014-05-11 – 2014-05-12 (×2): 100 mg via ORAL
  Filled 2014-05-10 (×3): qty 1

## 2014-05-10 MED ORDER — PHENYLEPHRINE HCL 10 MG/ML IJ SOLN
10.0000 mg | INTRAVENOUS | Status: DC | PRN
  Administered 2014-05-10: 75 ug/min via INTRAVENOUS

## 2014-05-10 MED ORDER — DEXTROSE 5 % IV SOLN
INTRAVENOUS | Status: DC | PRN
  Administered 2014-05-10: 11:00:00 via INTRAVENOUS

## 2014-05-10 MED ORDER — ALUM & MAG HYDROXIDE-SIMETH 200-200-20 MG/5ML PO SUSP: 15.0000 mL | ORAL | Status: DC | PRN

## 2014-05-10 SURGICAL SUPPLY — 41 items
BANDAGE ELASTIC 4 VELCRO ST LF (GAUZE/BANDAGES/DRESSINGS) ×2 IMPLANT
BANDAGE ELASTIC 6 VELCRO ST LF (GAUZE/BANDAGES/DRESSINGS) ×2 IMPLANT
BANDAGE ESMARK 6X9 LF (GAUZE/BANDAGES/DRESSINGS) IMPLANT
BNDG ESMARK 6X9 LF (GAUZE/BANDAGES/DRESSINGS)
BNDG GAUZE ELAST 4 BULKY (GAUZE/BANDAGES/DRESSINGS) ×2 IMPLANT
CANISTER SUCTION 2500CC (MISCELLANEOUS) ×2 IMPLANT
CLIP LIGATING EXTRA MED SLVR (CLIP) ×2 IMPLANT
CLIP LIGATING EXTRA SM BLUE (MISCELLANEOUS) ×2 IMPLANT
CUFF TOURNIQUET SINGLE 34IN LL (TOURNIQUET CUFF) IMPLANT
CUFF TOURNIQUET SINGLE 44IN (TOURNIQUET CUFF) IMPLANT
DRAIN SNY 10X20 3/4 PERF (WOUND CARE) IMPLANT
DRAPE ORTHO SPLIT 77X108 STRL (DRAPES) ×2
DRAPE PROXIMA HALF (DRAPES) ×2 IMPLANT
DRAPE SURG ORHT 6 SPLT 77X108 (DRAPES) ×2 IMPLANT
ELECT REM PT RETURN 9FT ADLT (ELECTROSURGICAL) ×2
ELECTRODE REM PT RTRN 9FT ADLT (ELECTROSURGICAL) ×1 IMPLANT
EVACUATOR SILICONE 100CC (DRAIN) IMPLANT
GAUZE SPONGE 4X4 12PLY STRL (GAUZE/BANDAGES/DRESSINGS) ×2 IMPLANT
GAUZE XEROFORM 5X9 LF (GAUZE/BANDAGES/DRESSINGS) ×2 IMPLANT
GLOVE SS BIOGEL STRL SZ 7.5 (GLOVE) ×1 IMPLANT
GLOVE SUPERSENSE BIOGEL SZ 7.5 (GLOVE) ×1
GOWN STRL REUS W/ TWL LRG LVL3 (GOWN DISPOSABLE) ×3 IMPLANT
GOWN STRL REUS W/TWL LRG LVL3 (GOWN DISPOSABLE) ×3
KIT BASIN OR (CUSTOM PROCEDURE TRAY) ×2 IMPLANT
KIT ROOM TURNOVER OR (KITS) ×2 IMPLANT
NS IRRIG 1000ML POUR BTL (IV SOLUTION) ×2 IMPLANT
PACK GENERAL/GYN (CUSTOM PROCEDURE TRAY) ×2 IMPLANT
PAD ARMBOARD 7.5X6 YLW CONV (MISCELLANEOUS) ×4 IMPLANT
PAD CAST 4YDX4 CTTN HI CHSV (CAST SUPPLIES) ×1 IMPLANT
PADDING CAST COTTON 4X4 STRL (CAST SUPPLIES) ×1
PADDING CAST COTTON 6X4 STRL (CAST SUPPLIES) IMPLANT
SAW GIGLI STERILE 20 (MISCELLANEOUS) ×2 IMPLANT
SPONGE GAUZE 4X4 12PLY STER LF (GAUZE/BANDAGES/DRESSINGS) ×2 IMPLANT
STAPLER VISISTAT 35W (STAPLE) ×2 IMPLANT
STOCKINETTE IMPERVIOUS LG (DRAPES) ×2 IMPLANT
SUT ETHILON 3 0 PS 1 (SUTURE) IMPLANT
SUT VIC AB 0 CT1 18XCR BRD 8 (SUTURE) ×2 IMPLANT
SUT VIC AB 0 CT1 8-18 (SUTURE) ×2
SUT VICRYL AB 2 0 TIES (SUTURE) ×2 IMPLANT
UNDERPAD 30X30 INCONTINENT (UNDERPADS AND DIAPERS) ×2 IMPLANT
WATER STERILE IRR 1000ML POUR (IV SOLUTION) ×2 IMPLANT

## 2014-05-10 NOTE — Anesthesia Preprocedure Evaluation (Signed)
Anesthesia Evaluation  Patient identified by MRN, date of birth, ID band Patient awake    Reviewed: Allergy & Precautions, NPO status , Patient's Chart, lab work & pertinent test results  Airway Mallampati: II   Neck ROM: full    Dental   Pulmonary neg pulmonary ROS,          Cardiovascular hypertension, + CAD and + Peripheral Vascular Disease     Neuro/Psych CVA    GI/Hepatic   Endo/Other  diabetes, Type 2  Renal/GU ESRF and DialysisRenal disease     Musculoskeletal  (+) Arthritis -,   Abdominal   Peds  Hematology   Anesthesia Other Findings   Reproductive/Obstetrics                             Anesthesia Physical Anesthesia Plan  ASA: III  Anesthesia Plan: General   Post-op Pain Management:    Induction: Intravenous  Airway Management Planned: LMA  Additional Equipment:   Intra-op Plan:   Post-operative Plan: Extubation in OR  Informed Consent: I have reviewed the patients History and Physical, chart, labs and discussed the procedure including the risks, benefits and alternatives for the proposed anesthesia with the patient or authorized representative who has indicated his/her understanding and acceptance.     Plan Discussed with: CRNA, Anesthesiologist and Surgeon  Anesthesia Plan Comments:         Anesthesia Quick Evaluation

## 2014-05-10 NOTE — Transfer of Care (Signed)
Immediate Anesthesia Transfer of Care Note  Patient: Elizabeth [name]  Procedure(s) Performed: Procedure(s): AMPUTATION BELOW KNEE (Left)  Patient Location: PACU  Anesthesia Type:General  Level of Consciousness: awake, alert  and oriented  Airway & Oxygen Therapy: Patient Spontanous Breathing and Patient connected to nasal cannula oxygen  Post-op Assessment: Report given to RN and Post -op Vital signs reviewed and stable  Post vital signs: Reviewed and stable  Last Vitals:  Filed Vitals:   05/10/14 1008  BP: 105/63  Pulse: 97  Temp: 35.9 C  Resp: 15    Complications: No apparent anesthesia complications   SPO2 99 on 2L N/C. BP 100/62. Blood sugar 123. Pt resting comfortably.

## 2014-05-10 NOTE — Progress Notes (Signed)
Patient is in surgery for BKA.  Will try to see after  Marlin Canary DO

## 2014-05-10 NOTE — Op Note (Signed)
    OPERATIVE REPORT  DATE OF SURGERY: 05/10/2014  PATIENT: Elizabeth [name], 69 y.o. female MRN: [id number]  DOB: 01/03/1946  PRE-OPERATIVE DIAGNOSIS: Gangrene left foot  POST-OPERATIVE DIAGNOSIS:  Same  PROCEDURE: Left below-knee amputation  SURGEON:  Gretta Began, M.D.  PHYSICIAN ASSISTANT: Trinh  ANESTHESIA:  lma  EBL: Minimal ml  Total I/O In: 350 [I.V.:350] Out: 100 [Urine:50; Blood:50]  BLOOD ADMINISTERED: None  DRAINS: None  SPECIMEN: Below-knee amputation  COUNTS CORRECT:  YES  PLAN OF CARE: PACU   PATIENT DISPOSITION:  PACU - hemodynamically stable  PROCEDURE DETAILS: The patient was taken to the operating placed supine position where the area of the left leg was prepped and draped in usual sterile fashion. Using a posterior base muscle flap a incision was made several fingerbreadths below the tibial prominence. This was carried down through the muscle to the level of the tibia. The anterior tibial muscle body was divided in line with the anterior incision and the anterior tibial neurovascular bundle was ligated with 0 Vicryl ties and divided. The gastrocnemius and soleus muscle were left intact with the posterior skin flap. Muscle was divided with electrocautery. The popliteal artery and vein were ligated with 0 Vicryl ties and divided as well. Periosteum was elevated off the tibia and fibula. The tibia was divided with a Gigli saw. The fibula was divided with bone shears. The edges edges of the bone were smoothed with a bone rasp. Wound was irrigated with saline. Hemostasis tablet cautery. The wounds were closed with approximating the anterior fascia to the posterior fascia with 0 Vicryl figure-of-eight sutures. Skin was closed with skin clips. Sterile dressing was applied the patient was transferred to the recovery room in stable condition   Gretta Began, M.D. 05/10/2014 12:24 PM

## 2014-05-10 NOTE — Progress Notes (Signed)
PROGRESS NOTE  Elizabeth [name] ZOX:[id number] DOB: 14-Nov-1945 DOA: 05/09/2014 PCP: [name], MD  Assessment/Plan: Osteomyelitis left foot: --s/p amputation --d/c abx -PT   ESRD on hemodialysis. Nephrology consulted for continuation of hemodialysis.  Insulin dependent Diabetes mellitus. a1c pending, continue insulin.  Essential hypertension. Continue carvedilol.   Code Status: full Family Communication: called granddaughter Disposition Plan:    Consultants:  vascular  Procedures:      HPI/Subjective: No SOB, no CP Pain at site of amputation   Objective: Filed Vitals:   05/10/14 1325  BP: 97/56  Pulse: 96  Temp: 98 F (36.7 C)  Resp: 13    Intake/Output Summary (Last 24 hours) at 05/10/14 1406 Last data filed at 05/10/14 1208  Gross per 24 hour  Intake    350 ml  Output    100 ml  Net    250 ml   Filed Weights   05/09/14 1935 05/10/14 0630 05/10/14 1008  Weight: 36.378 kg (80 lb 3.2 oz) 37.2 kg (82 lb 0.2 oz) 36.6 kg (80 lb 11 oz)    Exam:   General:  Awake, alert  Cardiovascular: rrr  Respiratory: clear  Abdomen: +BS, soft  Musculoskeletal: no edema  Data Reviewed: Basic Metabolic Panel:  Recent Labs Lab 05/09/14 1640 05/10/14 0500  NA 135 136  K 3.7 3.5  CL 93* 95*  CO2 30 31  GLUCOSE 299* 45*  BUN 43* 48*  CREATININE 3.49* 3.92*  CALCIUM 8.4 8.0*   Liver Function Tests:  Recent Labs Lab 05/09/14 1640  AST 14  ALT 11  ALKPHOS 152*  BILITOT 0.4  PROT 7.0  ALBUMIN 2.1*   No results for input(s): LIPASE, AMYLASE in the last 168 hours. No results for input(s): AMMONIA in the last 168 hours. CBC:  Recent Labs Lab 05/09/14 1640 05/10/14 0500  WBC 8.5 7.7  NEUTROABS 6.7  --   HGB 11.4* 10.8*  HCT 37.1 35.5*  MCV 87.1 86.6  PLT 252 271   Cardiac Enzymes: No results for input(s): CKTOTAL, CKMB, CKMBINDEX, TROPONINI in the last 168 hours. BNP (last 3 results) No results for input(s): BNP in the last 8760  hours.  ProBNP (last 3 results) No results for input(s): PROBNP in the last 8760 hours.  CBG:  Recent Labs Lab 05/09/14 1951 05/10/14 1027 05/10/14 1046 05/10/14 1226 05/10/14 1308  GLUCAP 212* 46* 110* 123* 88    Recent Results (from the past 240 hour(s))  Surgical pcr screen     Status: None   Collection Time: 05/09/14 10:14 PM  Result Value Ref Range Status   MRSA, PCR NEGATIVE NEGATIVE Final   Staphylococcus aureus NEGATIVE NEGATIVE Final    Comment:        The Xpert SA Assay (FDA approved for NASAL specimens in patients over 94 years of age), is one component of a comprehensive surveillance program.  Test performance has been validated by [facility] for patients greater than or equal to 11 year old. It is not intended to diagnose infection nor to guide or monitor treatment.      Studies: Dg Chest 2 View  05/09/2014   CLINICAL DATA:  Cough.  EXAM: CHEST - 2 VIEW  COMPARISON:  Two-view chest 04/28/2014.  FINDINGS: The heart is mildly enlarged. A right IJ dialysis catheter is in place. Aeration is improved. Effusions are near completely resolved. There is no significant edema.  IMPRESSION: 1. Stable mild cardiomegaly. 2. Decreased pleural effusions and improved aeration.   Electronically Signed   By:  Marin Roberts M.D.   On: 05/09/2014 17:18   Dg Foot Complete Left  05/09/2014   CLINICAL DATA:  Pain/lacerations on left foot  EXAM: LEFT FOOT - COMPLETE 3+ VIEW  COMPARISON:  MRI left foot dated 03/17/2014  FINDINGS: Status post mid tarsal amputation of the 2nd digit. Bony sequestrum involving the residual 2nd metatarsal, indicating residual osteomyelitis. Associated soft tissue swelling with subcutaneous gas.  Additional cortical irregularity involving the medial aspect of the 3rd metatarsal head, indicating acute osteomyelitis.  Additional cortical irregularity involving the base/proximal shaft of the 3rd proximal phalanx. Associated pathologic fracture.   IMPRESSION: Acute osteomyelitis involving in the residual 2nd metatarsal, 3rd metatarsal head, and 3rd proximal phalanx.  Associated pathologic fracture of the 3rd proximal phalanx.   Electronically Signed   By: Charline Bills M.D.   On: 05/09/2014 17:24    Scheduled Meds: . carvedilol  6.25 mg Oral BID  . [START ON 05/11/2014] docusate sodium  100 mg Oral Daily  . feeding supplement (PRO-STAT SUGAR FREE 64)  30 mL Oral TID WC  . heparin  5,000 Units Subcutaneous 3 times per day  . insulin aspart  0-15 Units Subcutaneous TID WC  . insulin glargine  7 Units Subcutaneous Q2200  . pantoprazole  40 mg Oral Daily  . piperacillin-tazobactam (ZOSYN)  IV  2.25 g Intravenous Q8H  . pravastatin  20 mg Oral Daily  . saccharomyces boulardii  250 mg Oral BID  . sevelamer carbonate  800 mg Oral TID WC  . vancomycin  500 mg Intravenous Q T,Th,Sa-HD   Continuous Infusions:  Antibiotics Given (last 72 hours)    Date/Time Action Medication Dose Rate   05/09/14 1902 Given   vancomycin (VANCOCIN) IVPB 750 mg/150 ml premix 750 mg 150 mL/hr   05/10/14 0151 Given   piperacillin-tazobactam (ZOSYN) IVPB 2.25 g 2.25 g 100 mL/hr   05/10/14 0905 Given   vancomycin (VANCOCIN) 500 mg in sodium chloride 0.9 % 100 mL IVPB 500 mg 100 mL/hr      Active Problems:   Osteomyelitis    Time spent: 25 min    Rontrell Moquin  [facility] Pager [phone]. If 7PM-7AM, please contact night-coverage at [email or web address], password [facility] 05/10/2014, 2:06 PM  LOS: 1 day

## 2014-05-10 NOTE — Progress Notes (Signed)
Patient arrived to short stay CBG checked noted at 55. Notified Dr. Ladene Artist orders received. See anesthesia documentation for time d50 given

## 2014-05-10 NOTE — Progress Notes (Signed)
[facility] Progress Note    Assessment: 1. Infected lt 2nd toe amputation site - appears to be infected and wound not healing properly with mild erythema as well. Also with a dry ulcer at the 1st MTP joint site. I wonder if she's getting enough perfusion pressure to heal these wounds. 2. ESRD - Last HD was on Thursday for a full treatment. No acute indication for HD tonight. HD at [facility] clinic, EDW 36.5kg, Heparin 3700, Hectorol , Aranesp qweekly, t= 3.5hr 3. Anemia - Transfuse as needed. Would not escalate the Aranesp as she would be unlikely to respond with the ongoing inflammation. 4. Metabolic bone disease - On Hectorol qtreatment 5. HTN - Fairly well controlled 6. Volume - close to EDW. Very frail - nutritional status poor.  Plan 1. Seen on dialysis today with no heparin in case she goes for a debridement. 2. Seen by Vascular Dr. Arbie Cookey and pt will be going for a debridement +/- amputation.  3. No escalation in Aranesp with the ongoing inflammation; transfuse if needed. 4. Nutrition supplement - Nepro BID. She needs more caloric intake.  Subjective:   Seen on HD Qb/Qd 400/800UF 1.2 liters ( net) AP/VP -170/150 VSS 111/63 on 4K bath  No change to prescription. She is doing well. Very hungry but unfortunately may be going to surgery later today and needs to be NPO.   Objective:   BP 105/60 mmHg  Pulse 94  Temp(Src) 97.3 F (36.3 C) (Oral)  Resp 15  Ht  (1.6 m)  Wt 37.2 kg (82 lb 0.2 oz)  BMI 14.53 kg/m2  SpO2 100% No intake or output data in the 24 hours ending 05/10/14 0840 Weight change:   Physical Exam: Gen: NAD, A&Ox3, pleasant Skin: no rash, cyanosis HEENT: EOMI, sclera anicteric, throat clear, poor dentition Neck: No JVD, no LAD Chest: CTA b/l, no rales CV: rhythm regular, poor distal pedal pulses  Abdomen: soft, NDNT +BS Ext: No edema, Left 2nd toe amputation site not healed with drainage, mild erythema, 1st MTP  joint dry ulceration Neuro: alert, Ox3, nonfocal  Imaging: Dg Chest 2 View  05/09/2014   CLINICAL DATA:  Cough.  EXAM: CHEST - 2 VIEW  COMPARISON:  Two-view chest 04/28/2014.  FINDINGS: The heart is mildly enlarged. A right IJ dialysis catheter is in place. Aeration is improved. Effusions are near completely resolved. There is no significant edema.  IMPRESSION: 1. Stable mild cardiomegaly. 2. Decreased pleural effusions and improved aeration.   Electronically Signed   By: Marin Roberts M.D.   On: 05/09/2014 17:18   Dg Foot Complete Left  05/09/2014   CLINICAL DATA:  Pain/lacerations on left foot  EXAM: LEFT FOOT - COMPLETE 3+ VIEW  COMPARISON:  MRI left foot dated 03/17/2014  FINDINGS: Status post mid tarsal amputation of the 2nd digit. Bony sequestrum involving the residual 2nd metatarsal, indicating residual osteomyelitis. Associated soft tissue swelling with subcutaneous gas.  Additional cortical irregularity involving the medial aspect of the 3rd metatarsal head, indicating acute osteomyelitis.  Additional cortical irregularity involving the base/proximal shaft of the 3rd proximal phalanx. Associated pathologic fracture.  IMPRESSION: Acute osteomyelitis involving in the residual 2nd metatarsal, 3rd metatarsal head, and 3rd proximal phalanx.  Associated pathologic fracture of the 3rd proximal phalanx.   Electronically Signed   By: Charline Bills M.D.   On: 05/09/2014 17:24    Labs: BMET  Recent Labs Lab 05/09/14 1640 05/10/14 0500  NA 135 136  K 3.7 3.5  CL 93* 95*  CO2 30 31  GLUCOSE 299* 45*  BUN 43* 48*  CREATININE 3.49* 3.92*  CALCIUM 8.4 8.0*   CBC  Recent Labs Lab 05/09/14 1640 05/10/14 0500  WBC 8.5 7.7  NEUTROABS 6.7  --   HGB 11.4* 10.8*  HCT 37.1 35.5*  MCV 87.1 86.6  PLT 252 271    Medications:    . carvedilol  6.25 mg Oral BID  . feeding supplement (PRO-STAT SUGAR FREE 64)  30 mL Oral TID WC  . heparin  5,000 Units Subcutaneous 3 times per day  .  insulin aspart  0-15 Units Subcutaneous TID WC  . insulin glargine  7 Units Subcutaneous Q2200  . piperacillin-tazobactam (ZOSYN)  IV  2.25 g Intravenous Q8H  . pravastatin  20 mg Oral Daily  . saccharomyces boulardii  250 mg Oral BID  . sevelamer carbonate  800 mg Oral TID WC  . vancomycin  500 mg Intravenous Q T,Th,Sa-HD      Paulene Floor, MD 05/10/2014, 8:40 AM

## 2014-05-10 NOTE — Consult Note (Signed)
Patient name: Elizabeth [name] MRN: [id number] DOB: 05-Sep-1945 Sex: female   Referred by: [facility] hospitalist  Reason for referral:  Chief Complaint  Patient presents with  . Wound Infection    HISTORY OF PRESENT ILLNESS: Patient is a 69 year old female with end-stage renal disease. Had undergone left popliteal and peroneal endarterectomy and patch by Dr. Darrick Penna on 03/26/2014. Also had amputation of her second toe. She been seen in the office on 217 by Dr. Darrick Penna and felt that she had no possibility for healing this and suggested a transmetatarsal or probable below-knee amputation. Patient is not willing to consider this at this time. She was admitted last evening with the drainage from the toe amputation. Does not appear to be toxic. Is seen today on hemodialysis unit. She does have some tenderness and pain in her foot.  Past Medical History  Diagnosis Date  . Hyperlipidemia   . Urinary retention     DX NEUROGENIC BLADDER--HAS INDWELLING FOLEY CATHETER  . Arthritis   . Anemia   . CAD (coronary artery disease)   . Neuropathic pain   . Stroke     patient not aware.  . Foley catheter in place     for urinary retention  . Critical lower limb ischemia   . Hypertension   . Peripheral vascular disease   . Hypoglycemia 08/19/2013  . Hypokalemia 08/19/2013  . Diabetes mellitus     TAKES INSULIN   . ESRD (end stage renal disease)     [address] T, TH, S    Past Surgical History  Procedure Laterality Date  . Insertion of suprapubic catheter N/A 05/16/2012    Procedure: INSERTION OF SUPRAPUBIC CATHETER;  Surgeon: Sebastian Ache, MD;  Location: [facility] ORS;  Service: Urology;  Laterality: N/A;  . Bascilic vein transposition Left 10/30/2012    Procedure: LEFT 1ST STAGE BASCILIC VEIN TRANSPOSITION;  Surgeon: Chuck Hint, MD;  Location[facility] OR;  Service: Vascular;  Laterality: Left;  . Nm myocar perf wall motion  04/19/2012    low risk study-EF 44%,mild inferolateral hypkinesis    . Bascilic vein transposition Left 02/05/2013    Procedure: BASCILIC VEIN TRANSPOSITION 2ND STAGE;  Surgeon: Chuck Hint, MD;  Location: [facility] OR;  Service: Vascular;  Laterality: Left;  . Radiology with anesthesia N/A 10/25/2013    Procedure: RADIOLOGY WITH ANESTHESIA;  Surgeon: Medication Radiologist, MD;  Location: [facility] OR;  Service: Radiology;  Laterality: N/A;  . Fistulogram Left 04/29/2013    Procedure: FISTULOGRAM;  Surgeon: Chuck Hint, MD;  Location: [facility] CATH LAB;  Service: Cardiovascular;  Laterality: Left;  . Angioplasty Left 04/29/2013    Procedure: ANGIOPLASTY;  Surgeon: Chuck Hint, MD;  Location: [facility] CATH LAB;  Service: Cardiovascular;  Laterality: Left;  . Abdominal aortagram N/A 03/19/2014    Procedure: ABDOMINAL Ronny Flurry;  Surgeon: Sherren Kerns, MD;  Location: [facility] CATH LAB;  Service: Cardiovascular;  Laterality: N/A;  . Left and right heart catheterization with coronary angiogram N/A 03/24/2014    Procedure: LEFT AND RIGHT HEART CATHETERIZATION WITH CORONARY ANGIOGRAM;  Surgeon: Marykay Lex, MD;  Location: [facility] CATH LAB;  Service: Cardiovascular;  Laterality: N/A;  . Endarterectomy popliteal Left 03/26/2014    Procedure: Left popliteal and peroneal artery endarterectomy with vein patch angioplasty;  Surgeon: Sherren Kerns, MD;  Location: [facility] OR;  Service: Vascular;  Laterality: Left;  . Amputation Left 03/26/2014    Procedure: AMPUTATION SECOND LEFT TOE;  Surgeon: Sherren Kerns, MD;  Location: [facility] OR;  Service: Vascular;  Laterality: Left;  . Av fistula placement Left 04/28/2014    Procedure: CREATION OF LEFT BRACHIOCEPHALIC  ARTERIOVENOUS (AV) FISTULA CREATION;  Surgeon: Fransisco Hertz, MD;  Location: [facility] OR;  Service: Vascular;  Laterality: Left;    History   Social History  . Marital Status: Single    Spouse Name: N/A  . Number of Children: N/A  . Years of Education: N/A   Occupational History  . Not on file.   Social History Main Topics  .  Smoking status: Never Smoker   . Smokeless tobacco: Never Used  . Alcohol Use: No  . Drug Use: No  . Sexual Activity: No   Other Topics Concern  . Not on file   Social History Narrative    Family History  Problem Relation Age of Onset  . Hyperlipidemia Mother   . Hypertension Mother   . Hodgkin's lymphoma Father   . Heart disease Sister     Allergies as of 05/09/2014  . (No Known Allergies)    No current facility-administered medications on file prior to encounter.   Current Outpatient Prescriptions on File Prior to Encounter  Medication Sig Dispense Refill  . acetaminophen-codeine (TYLENOL #3) 300-30 MG per tablet Take 1 tablet by mouth every 6 (six) hours as needed for moderate pain. 30 tablet 0  . Amino Acids-Protein Hydrolys (FEEDING SUPPLEMENT, PRO-STAT SUGAR FREE 64,) LIQD Take 30 mLs by mouth 3 (three) times daily with meals.    Marland Kitchen aspirin EC 81 MG tablet Take 81 mg by mouth daily.    . carvedilol (COREG) 6.25 MG tablet take 1 tablet by mouth twice a day 60 tablet 11  . collagenase (SANTYL) ointment Apply 1 application topically daily as needed (wound care).    Marland Kitchen docusate sodium (COLACE) 100 MG capsule Take 1 capsule (100 mg total) by mouth daily. 10 capsule 0  . glucose blood (ONETOUCH VERIO) test strip Use as instructed to check blood sugar 4 times per day dx code E13.10 150 each 3  . insulin aspart (NOVOLOG) 100 UNIT/ML injection Inject 0-15 Units into the skin 3 (three) times daily with meals. (Patient taking differently: Inject 3 Units into the skin 3 (three) times daily as needed for high blood sugar (CBG >150). ) 10 mL 11  . insulin glargine (LANTUS) 100 UNIT/ML injection Inject 0.05 mLs (5 Units total) into the skin daily. (Patient taking differently: Inject 7 Units into the skin daily at 10 pm. ) 10 mL 11  . loratadine (CLARITIN) 10 MG tablet Take 10 mg by mouth daily as needed for allergies.    . Multiple Vitamins-Minerals (DECUBI-VITE) CAPS Take 2 capsules by  mouth daily.    Marland Kitchen NUTRITIONAL SUPPLEMENT LIQD Take 120 mLs by mouth 3 (three) times daily. 2 Calorie supplement    . Nutritional Supplements (FEEDING SUPPLEMENT, NEPRO CARB STEADY,) LIQD Take 237 mLs by mouth 3 (three) times daily between meals. (Patient not taking: Reported on 04/25/2014)  0  . ONETOUCH DELICA LANCETS FINE MISC Use to check blood sugar 4 times per day 200 each 3  . oxyCODONE-acetaminophen (PERCOCET/ROXICET) 5-325 MG per tablet Take 1 tablet by mouth every 8 (eight) hours as needed for moderate pain or severe pain. 30 tablet 0  . pravastatin (PRAVACHOL) 20 MG tablet Take 20 mg by mouth daily.    . promethazine (PHENERGAN) 12.5 MG tablet Take 1 tablet (12.5 mg total) by mouth every 6 (six) hours as needed for nausea or  vomiting. 30 tablet 0  . saccharomyces boulardii (FLORASTOR) 250 MG capsule Take 1 capsule (250 mg total) by mouth 2 (two) times daily. (Patient not taking: Reported on 04/25/2014)    . sevelamer carbonate (RENVELA) 800 MG tablet Take 1 tablet (800 mg total) by mouth 3 (three) times daily with meals.       REVIEW OF SYSTEMS:  No change from recent admission  PHYSICAL EXAMINATION:  General: The patient is a well-nourished female, in no acute distress. Currently on hemodialysis Vital signs are BP 102/61 mmHg  Pulse 95  Temp(Src) 97.3 F (36.3 C) (Oral)  Resp 18  Ht  (1.6 m)  Wt 82 lb 0.2 oz (37.2 kg)  BMI 14.53 kg/m2  SpO2 100% Pulmonary: There is a good air exchange Abdomen: Soft and non-tender  Musculoskeletal: She does have open amputation with no marked evidence of tracking up her tendon sheaths on her foot. Has area on the plantar aspect of her foot in the ball of her foot with some fluctuance. Does have some swelling and mild tenderness at the level of her ankle. She does have a well-healed incision from her popliteal endarterectomy Neurologic: No focal weakness or paresthesias are detected, Skin: There are no ulcer or rashes noted. Psychiatric:  The patient has normal affect. Cardiovascular: Palpable left popliteal pulse    Impression and Plan:  Had long discussion with the patient. Explained there was no option for healing other than below-knee amputation. She has ongoing wound issue where she would require an incision for transmetatarsal amputation with fluctuance at this area. Suspect that she does have pus in her foot as well. She understands and agrees to left below-knee amputation. She has been nothing by mouth. We will proceed with this when she comes off of hemodialysis this morning.    Vasco Chong [facility] Office: [phone]

## 2014-05-10 NOTE — Anesthesia Procedure Notes (Signed)
Procedure Name: LMA Insertion Date/Time: 05/10/2014 11:28 AM Performed by: Daiva Eves Pre-anesthesia Checklist: Patient identified, Timeout performed, Emergency Drugs available, Suction available and Patient being monitored Patient Re-evaluated:Patient Re-evaluated prior to inductionOxygen Delivery Method: Circle system utilized Preoxygenation: Pre-oxygenation with 100% oxygen Intubation Type: IV induction Ventilation: Mask ventilation without difficulty LMA: LMA inserted LMA Size: 4.0 Number of attempts: 1 Placement Confirmation: positive ETCO2,  CO2 detector and breath sounds checked- equal and bilateral Tube secured with: Tape Dental Injury: Teeth and Oropharynx as per pre-operative assessment

## 2014-05-10 NOTE — Anesthesia Postprocedure Evaluation (Signed)
Anesthesia Post Note  Patient: Elizabeth [name]  Procedure(s) Performed: Procedure(s) (LRB): AMPUTATION BELOW KNEE (Left)  Anesthesia type: General  Patient location: PACU  Post pain: Pain level controlled and Adequate analgesia  Post assessment: Post-op Vital signs reviewed, Patient's Cardiovascular Status Stable, Respiratory Function Stable, Patent Airway and Pain level controlled  Last Vitals:  Filed Vitals:   05/10/14 1455  BP: 108/63  Pulse:   Temp:   Resp:     Post vital signs: Reviewed and stable  Level of consciousness: awake, alert  and oriented  Complications: No apparent anesthesia complications

## 2014-05-11 DIAGNOSIS — E11621 Type 2 diabetes mellitus with foot ulcer: Secondary | ICD-10-CM

## 2014-05-11 DIAGNOSIS — L97509 Non-pressure chronic ulcer of other part of unspecified foot with unspecified severity: Secondary | ICD-10-CM

## 2014-05-11 DIAGNOSIS — D638 Anemia in other chronic diseases classified elsewhere: Secondary | ICD-10-CM

## 2014-05-11 LAB — BASIC METABOLIC PANEL
ANION GAP: 9 (ref 5–15)
BUN: 24 mg/dL — ABNORMAL HIGH (ref 6–23)
CO2: 29 mmol/L (ref 19–32)
Calcium: 8.2 mg/dL — ABNORMAL LOW (ref 8.4–10.5)
Chloride: 98 mmol/L (ref 96–112)
Creatinine, Ser: 3.12 mg/dL — ABNORMAL HIGH (ref 0.50–1.10)
GFR calc Af Amer: 16 mL/min — ABNORMAL LOW (ref >90)
GFR, EST NON AFRICAN AMERICAN: 14 mL/min — AB (ref >90)
Glucose, Bld: 114 mg/dL — ABNORMAL HIGH (ref 70–99)
Potassium: 4.5 mmol/L (ref 3.5–5.1)
SODIUM: 136 mmol/L (ref 135–145)

## 2014-05-11 LAB — CBC
HEMATOCRIT: 37.1 % (ref 36.0–46.0)
Hemoglobin: 10.8 g/dL — ABNORMAL LOW (ref 12.0–15.0)
MCH: 26.3 pg (ref 26.0–34.0)
MCHC: 29.1 g/dL — AB (ref 30.0–36.0)
MCV: 90.3 fL (ref 78.0–100.0)
Platelets: 213 10*3/uL (ref 150–400)
RBC: 4.11 MIL/uL (ref 3.87–5.11)
RDW: 21 % — AB (ref 11.5–15.5)
WBC: 9.7 10*3/uL (ref 4.0–10.5)

## 2014-05-11 LAB — GLUCOSE, CAPILLARY
GLUCOSE-CAPILLARY: 114 mg/dL — AB (ref 70–99)
Glucose-Capillary: 143 mg/dL — ABNORMAL HIGH (ref 70–99)
Glucose-Capillary: 188 mg/dL — ABNORMAL HIGH (ref 70–99)

## 2014-05-11 NOTE — Evaluation (Signed)
Physical Therapy Evaluation Patient Details Name: Elizabeth [name] MRN: [id number] DOB: 01-13-1946 Today's Date: 05/11/2014   History of Present Illness  Admitted with L toe infection; now s/p L BKA  Past Medical History  Diagnosis Date  . Hyperlipidemia   . Urinary retention     DX NEUROGENIC BLADDER--HAS INDWELLING FOLEY CATHETER  . Arthritis   . Anemia   . CAD (coronary artery disease)   . Neuropathic pain   . Stroke     patient not aware.  . Foley catheter in place     for urinary retention  . Critical lower limb ischemia   . Hypertension   . Peripheral vascular disease   . Hypoglycemia 08/19/2013  . Hypokalemia 08/19/2013  . Diabetes mellitus     TAKES INSULIN   . ESRD (end stage renal disease)     [address] T, TH, S   Past Surgical History  Procedure Laterality Date  . Insertion of suprapubic catheter N/A 05/16/2012    Procedure: INSERTION OF SUPRAPUBIC CATHETER;  Surgeon: Sebastian Ache, MD;  Location: [facility] ORS;  Service: Urology;  Laterality: N/A;  . Bascilic vein transposition Left 10/30/2012    Procedure: LEFT 1ST STAGE BASCILIC VEIN TRANSPOSITION;  Surgeon: Chuck Hint, MD;  [facility] OR;  Service: Vascular;  Laterality: Left;  . Nm myocar perf wall motion  04/19/2012    low risk study-EF 44%,mild inferolateral hypkinesis  . Bascilic vein transposition Left 02/05/2013    Procedure: BASCILIC VEIN TRANSPOSITION 2ND STAGE;  Surgeon: Chuck Hint, MD;  Location: [facility] OR;  Service: Vascular;  Laterality: Left;  . Radiology with anesthesia N/A 10/25/2013    Procedure: RADIOLOGY WITH ANESTHESIA;  Surgeon: Medication Radiologist, MD;  Location: [facility] OR;  Service: Radiology;  Laterality: N/A;  . Fistulogram Left 04/29/2013    Procedure: FISTULOGRAM;  Surgeon: Chuck Hint, MD;  Location: [facility] CATH LAB;  Service: Cardiovascular;  Laterality: Left;  . Angioplasty Left 04/29/2013    Procedure: ANGIOPLASTY;  Surgeon: Chuck Hint, MD;  Location: [facility]  CATH LAB;  Service: Cardiovascular;  Laterality: Left;  . Abdominal aortagram N/A 03/19/2014    Procedure: ABDOMINAL Ronny Flurry;  Surgeon: Sherren Kerns, MD;  Location: [facility] CATH LAB;  Service: Cardiovascular;  Laterality: N/A;  . Left and right heart catheterization with coronary angiogram N/A 03/24/2014    Procedure: LEFT AND RIGHT HEART CATHETERIZATION WITH CORONARY ANGIOGRAM;  Surgeon: Marykay Lex, MD;  Location: [facility] CATH LAB;  Service: Cardiovascular;  Laterality: N/A;  . Endarterectomy popliteal Left 03/26/2014    Procedure: Left popliteal and peroneal artery endarterectomy with vein patch angioplasty;  Surgeon: Sherren Kerns, MD;  Location: [facility] OR;  Service: Vascular;  Laterality: Left;  . Amputation Left 03/26/2014    Procedure: AMPUTATION SECOND LEFT TOE;  Surgeon: Sherren Kerns, MD;  Location: [facility] OR;  Service: Vascular;  Laterality: Left;  . Av fistula placement Left 04/28/2014    Procedure: CREATION OF LEFT BRACHIOCEPHALIC  ARTERIOVENOUS (AV) FISTULA CREATION;  Surgeon: Fransisco Hertz, MD;  Location: [facility] OR;  Service: Vascular;  Laterality: Left;     Clinical Impression  Patient is s/p above surgery resulting in functional limitations due to the deficits listed below (see PT Problem List).  Patient will benefit from skilled PT to increase their independence and safety with mobility to allow discharge to the venue listed below.       Follow Up Recommendations CIR    Equipment Recommendations  Rolling walker with 5" wheels;3in1 (PT);Wheelchair (  measurements PT);Wheelchair cushion (measurements PT)    Recommendations for Other Services OT consult     Precautions / Restrictions Precautions Precautions: Fall Restrictions Weight Bearing Restrictions: No      Mobility  Bed Mobility Overal bed mobility: Needs Assistance Bed Mobility: Supine to Sit     Supine to sit: Supervision     General bed mobility comments: Supervision for safety, but no gross difficulty  noted  Transfers Overall transfer level: Needs assistance Equipment used: Rolling walker (2 wheeled) Transfers: Sit to/from Stand Sit to Stand: Min assist         General transfer comment: min assist to steady once standing very good rise; cues for safety, noted pt had both hands on RW to push up  Ambulation/Gait Ambulation/Gait assistance: Mod assist Ambulation Distance (Feet):  (small pivot hop stpes bed to chair) Assistive device: Rolling walker (2 wheeled)       General Gait Details: Cues for technique and safety; made small hops on R LE which seemed jarring  Stairs            Wheelchair Mobility    Modified Rankin (Stroke Patients Only)       Balance Overall balance assessment: Needs assistance           Standing balance-Leahy Scale: Poor                               Pertinent Vitals/Pain Pain Assessment: No/denies pain    Home Living Family/patient expects to be discharged to:: Private residence Living Arrangements: Alone Available Help at Discharge: Family;Available PRN/intermittently Type of Home: Apartment Home Access: Elevator     Home Layout: One level Home Equipment: Grab bars - tub/shower;Wheelchair - manual;Hospital bed Additional Comments: has tub/shower unit, standard height commode    Prior Function Level of Independence: Independent with assistive device(s)         Comments: used w/c or furniture walked around home, goes to HD in w/c (independently manages her foley catheter)     Hand Dominance   Dominant Hand: Right    Extremity/Trunk Assessment   Upper Extremity Assessment: Generalized weakness           Lower Extremity Assessment: Generalized weakness;LLE deficits/detail   LLE Deficits / Details: Good quad activation and able to fully extend knee; Noted hamstring shortness     Communication   Communication: No difficulties  Cognition Arousal/Alertness: Awake/alert Behavior During Therapy: WFL  for tasks assessed/performed Overall Cognitive Status: Within Functional Limits for tasks assessed                      General Comments General comments (skin integrity, edema, etc.): pt is experiencing phantom sensation L foot; described residual limb stimulation/accomodation    Exercises        Assessment/Plan    PT Assessment Patient needs continued PT services  PT Diagnosis Difficulty walking;Generalized weakness   PT Problem List Decreased strength;Decreased range of motion;Decreased activity tolerance;Decreased balance;Decreased mobility;Decreased coordination;Decreased knowledge of use of DME;Pain;Decreased knowledge of precautions  PT Treatment Interventions DME instruction;Gait training;Stair training;Functional mobility training;Therapeutic activities;Therapeutic exercise;Balance training;Patient/family education   PT Goals (Current goals can be found in the Care Plan section) Acute Rehab PT Goals Patient Stated Goal: Would really like to go to CIR for rehab PT Goal Formulation: With patient Time For Goal Achievement: 05/25/14 Potential to Achieve Goals: Good    Frequency Min 3X/week   Barriers to discharge  Decreased caregiver support Will need a clearer picture of how much assist is available to Ms. Brake at home; she does has family close    Co-evaluation               End of Session Equipment Utilized During Treatment: Gait belt Activity Tolerance: Patient tolerated treatment well Patient left: in chair;with call bell/phone within reach;with chair alarm set Nurse Communication: Mobility status         Time: 1610-9604 PT Time Calculation (min) (ACUTE ONLY): 21 min   Charges:   PT Evaluation $Initial PT Evaluation Tier I: 1 Procedure     PT G CodesOlen Pel 05/11/2014, 11:47 AM  Van Clines, PT  Acute Rehabilitation Services Pager [phone] Office [phone]

## 2014-05-11 NOTE — Progress Notes (Signed)
PROGRESS NOTE  Elizabeth [name] XBJ:[id number] DOB: 27-Nov-1945 DOA: 05/09/2014 PCP: [name], MD  Assessment/Plan: Osteomyelitis left foot: --s/p amputation --d/c abx -PT   ESRD on hemodialysis. Nephrology consulted for continuation of hemodialysis.  Insulin dependent Diabetes mellitus. a1c pending, continue insulin.  Essential hypertension. Continue carvedilol.   Chronic foley  Code Status: full Family Communication: called granddaughter Disposition Plan: back to a SNF- await PT eval   Consultants:  vascular  Procedures:      HPI/Subjective: No SOB, no CP Pain at site of amputation   Objective: Filed Vitals:   05/11/14 0849  BP: 113/65  Pulse: 113  Temp: 98.9 F (37.2 C)  Resp: 17    Intake/Output Summary (Last 24 hours) at 05/11/14 0940 Last data filed at 05/11/14 0459  Gross per 24 hour  Intake    710 ml  Output    225 ml  Net    485 ml   Filed Weights   05/09/14 1935 05/10/14 0630 05/10/14 1008  Weight: 36.378 kg (80 lb 3.2 oz) 37.2 kg (82 lb 0.2 oz) 36.6 kg (80 lb 11 oz)    Exam:   General:  Awake, alert  Cardiovascular: rrr  Respiratory: clear  Abdomen: +BS, soft  Musculoskeletal: no edema  Data Reviewed: Basic Metabolic Panel:  Recent Labs Lab 05/09/14 1640 05/10/14 0500  NA 135 136  K 3.7 3.5  CL 93* 95*  CO2 30 31  GLUCOSE 299* 45*  BUN 43* 48*  CREATININE 3.49* 3.92*  CALCIUM 8.4 8.0*   Liver Function Tests:  Recent Labs Lab 05/09/14 1640  AST 14  ALT 11  ALKPHOS 152*  BILITOT 0.4  PROT 7.0  ALBUMIN 2.1*   No results for input(s): LIPASE, AMYLASE in the last 168 hours. No results for input(s): AMMONIA in the last 168 hours. CBC:  Recent Labs Lab 05/09/14 1640 05/10/14 0500  WBC 8.5 7.7  NEUTROABS 6.7  --   HGB 11.4* 10.8*  HCT 37.1 35.5*  MCV 87.1 86.6  PLT 252 271   Cardiac Enzymes: No results for input(s): CKTOTAL, CKMB, CKMBINDEX, TROPONINI in the last 168 hours. BNP (last 3  results) No results for input(s): BNP in the last 8760 hours.  ProBNP (last 3 results) No results for input(s): PROBNP in the last 8760 hours.  CBG:  Recent Labs Lab 05/10/14 1644 05/10/14 2059 05/10/14 2126 05/10/14 2307 05/11/14 0731  GLUCAP 80 69* 69* 96 114*    Recent Results (from the past 240 hour(s))  Blood culture (routine x 2)     Status: None (Preliminary result)   Collection Time: 05/09/14  4:40 PM  Result Value Ref Range Status   Specimen Description BLOOD RIGHT FOREARM  Final   Special Requests BOTTLES DRAWN AEROBIC AND ANAEROBIC 5CC  Final   Culture   Final           BLOOD CULTURE RECEIVED NO GROWTH TO DATE CULTURE WILL BE HELD FOR 5 DAYS BEFORE ISSUING A FINAL NEGATIVE REPORT Performed at [facility]    Report Status PENDING  Incomplete  Blood culture (routine x 2)     Status: None (Preliminary result)   Collection Time: 05/09/14  5:20 PM  Result Value Ref Range Status   Specimen Description BLOOD ARM RIGHT  Final   Special Requests BOTTLES DRAWN AEROBIC AND ANAEROBIC 5CC  Final   Culture   Final           BLOOD CULTURE RECEIVED NO GROWTH TO DATE CULTURE WILL BE  HELD FOR 5 DAYS BEFORE ISSUING A FINAL NEGATIVE REPORT Performed at [facility]    Report Status PENDING  Incomplete  Surgical pcr screen     Status: None   Collection Time: 05/09/14 10:14 PM  Result Value Ref Range Status   MRSA, PCR NEGATIVE NEGATIVE Final   Staphylococcus aureus NEGATIVE NEGATIVE Final    Comment:        The Xpert SA Assay (FDA approved for NASAL specimens in patients over 85 years of age), is one component of a comprehensive surveillance program.  Test performance has been validated by [facility] for patients greater than or equal to 46 year old. It is not intended to diagnose infection nor to guide or monitor treatment.      Studies: Dg Chest 2 View  05/09/2014   CLINICAL DATA:  Cough.  EXAM: CHEST - 2 VIEW  COMPARISON:  Two-view chest  04/28/2014.  FINDINGS: The heart is mildly enlarged. A right IJ dialysis catheter is in place. Aeration is improved. Effusions are near completely resolved. There is no significant edema.  IMPRESSION: 1. Stable mild cardiomegaly. 2. Decreased pleural effusions and improved aeration.   Electronically Signed   By: Marin Roberts M.D.   On: 05/09/2014 17:18   Dg Foot Complete Left  05/09/2014   CLINICAL DATA:  Pain/lacerations on left foot  EXAM: LEFT FOOT - COMPLETE 3+ VIEW  COMPARISON:  MRI left foot dated 03/17/2014  FINDINGS: Status post mid tarsal amputation of the 2nd digit. Bony sequestrum involving the residual 2nd metatarsal, indicating residual osteomyelitis. Associated soft tissue swelling with subcutaneous gas.  Additional cortical irregularity involving the medial aspect of the 3rd metatarsal head, indicating acute osteomyelitis.  Additional cortical irregularity involving the base/proximal shaft of the 3rd proximal phalanx. Associated pathologic fracture.  IMPRESSION: Acute osteomyelitis involving in the residual 2nd metatarsal, 3rd metatarsal head, and 3rd proximal phalanx.  Associated pathologic fracture of the 3rd proximal phalanx.   Electronically Signed   By: Charline Bills M.D.   On: 05/09/2014 17:24    Scheduled Meds: . carvedilol  6.25 mg Oral BID  . docusate sodium  100 mg Oral Daily  . feeding supplement (PRO-STAT SUGAR FREE 64)  30 mL Oral TID WC  . heparin  5,000 Units Subcutaneous 3 times per day  . insulin aspart  0-15 Units Subcutaneous TID WC  . insulin glargine  7 Units Subcutaneous Q2200  . pantoprazole  40 mg Oral Daily  . pravastatin  20 mg Oral Daily  . saccharomyces boulardii  250 mg Oral BID  . sevelamer carbonate  800 mg Oral TID WC   Continuous Infusions:  Antibiotics Given (last 72 hours)    Date/Time Action Medication Dose Rate   05/09/14 1902 Given   vancomycin (VANCOCIN) IVPB 750 mg/150 ml premix 750 mg 150 mL/hr   05/10/14 0151 Given    piperacillin-tazobactam (ZOSYN) IVPB 2.25 g 2.25 g 100 mL/hr   05/10/14 0905 Given   vancomycin (VANCOCIN) 500 mg in sodium chloride 0.9 % 100 mL IVPB 500 mg 100 mL/hr      Active Problems:   Osteomyelitis    Time spent: 25 min    Avanelle Pixley  [facility] Pager [phone]. If 7PM-7AM, please contact night-coverage at [email or web address], password [facility] 05/11/2014, 9:40 AM  LOS: 2 days

## 2014-05-11 NOTE — Progress Notes (Signed)
Subjective:  No cos , tolerated HD yesterday  Objective Vital signs in last 24 hours: Filed Vitals:   05/10/14 2021 05/10/14 2100 05/11/14 0439 05/11/14 0849  BP: 100/54 95/55 104/61 113/65  Pulse: 101 108 110 113  Temp:  99.5 F (37.5 C) 99.5 F (37.5 C) 98.9 F (37.2 C)  TempSrc:  Oral Oral Oral  Resp:  15 16 17   Height:      Weight:      SpO2:  94% 99% 100%   Weight change: 0.221 kg (7.8 oz)  Physical Exam: General: Alert thin  BF NAD , Appropriate  Heart: RRR, no mur, rub, gallop Lungs: CTA  Bilat, nonlaboured  Breathing  Abdomen:  Soft , NT, ND Extremities: L BKA, r no pedal edema  Dialysis Access: R IJ perm cath / L UA developing avf pos bruit     OP HD  = TTS [facility],  Problem/Plan: 1.  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 Arbie Cookey rx / No family per pt.  Will need SNHP 2. ESRD - HD [facility] )  Next hd tues   3. Anemia - HGB 10.8  On ESA 4. Secondary hyperparathyroidism -  On Renvela./ Hectorol  5. HTN/volume - 113/65  Am bp on Carvedilol 6.25mg  bid / will have lower edw with BKA 6. Nutrition- On prostat add Breeze supplement   7. DM type 2- per admit  Lenny Pastel, PA-C [facility] Beeper [phone] 05/11/2014,10:44 AM  LOS: 2 days  I have seen and examined this patient and agree with the plan of care. Doing well. Looks a little depressed but understandable after having an amputation. Need to continue nutritional supplementation and placement in a SNF for rehab.  Ethelene Hal, MD 05/11/2014, 11:18 AM  Labs: Basic Metabolic Panel:  Recent Labs Lab 05/09/14 1640 05/10/14 0500  NA 135 136  K 3.7 3.5  CL 93* 95*  CO2 30 31  GLUCOSE 299* 45*  BUN 43* 48*  CREATININE 3.49* 3.92*  CALCIUM 8.4 8.0*   Liver Function Tests:  Recent Labs Lab 05/09/14 1640  AST 14  ALT 11  ALKPHOS 152*  BILITOT 0.4  PROT 7.0  ALBUMIN 2.1*   CBC:  Recent Labs Lab 05/09/14 1640 05/10/14 0500  WBC 8.5 7.7  NEUTROABS 6.7  --   HGB  11.4* 10.8*  HCT 37.1 35.5*  MCV 87.1 86.6  PLT 252 271    Recent Labs Lab 05/10/14 1644 05/10/14 2059 05/10/14 2126 05/10/14 2307 05/11/14 0731  GLUCAP 80 69* 69* 96 114*    Studies/Results: Dg Chest 2 View  05/09/2014   CLINICAL DATA:  Cough.  EXAM: CHEST - 2 VIEW  COMPARISON:  Two-view chest 04/28/2014.  FINDINGS: The heart is mildly enlarged. A right IJ dialysis catheter is in place. Aeration is improved. Effusions are near completely resolved. There is no significant edema.  IMPRESSION: 1. Stable mild cardiomegaly. 2. Decreased pleural effusions and improved aeration.   Electronically Signed   By: Marin Roberts M.D.   On: 05/09/2014 17:18   Dg Foot Complete Left  05/09/2014   CLINICAL DATA:  Pain/lacerations on left foot  EXAM: LEFT FOOT - COMPLETE 3+ VIEW  COMPARISON:  MRI left foot dated 03/17/2014  FINDINGS: Status post mid tarsal amputation of the 2nd digit. Bony sequestrum involving the residual 2nd metatarsal, indicating residual osteomyelitis. Associated soft tissue swelling with subcutaneous gas.  Additional cortical irregularity involving the medial aspect of the 3rd  metatarsal head, indicating acute osteomyelitis.  Additional cortical irregularity involving the base/proximal shaft of the 3rd proximal phalanx. Associated pathologic fracture.  IMPRESSION: Acute osteomyelitis involving in the residual 2nd metatarsal, 3rd metatarsal head, and 3rd proximal phalanx.  Associated pathologic fracture of the 3rd proximal phalanx.   Electronically Signed   By: Charline Bills M.D.   On: 05/09/2014 17:24   Medications:   . carvedilol  6.25 mg Oral BID  . docusate sodium  100 mg Oral Daily  . feeding supplement (PRO-STAT SUGAR FREE 64)  30 mL Oral TID WC  . heparin  5,000 Units Subcutaneous 3 times per day  . insulin aspart  0-15 Units Subcutaneous TID WC  . insulin glargine  7 Units Subcutaneous Q2200  . pantoprazole  40 mg Oral Daily  . pravastatin  20 mg Oral Daily  .  saccharomyces boulardii  250 mg Oral BID  . sevelamer carbonate  800 mg Oral TID WC

## 2014-05-11 NOTE — Progress Notes (Signed)
Patient ID: Elizabeth [name], female   DOB: [birth year], 69 y.o.   MRN: [id number] Comfortable. Hemodynamically stable. Pain controlled with meds.  Left BKA dressing intact. Patient is able to straighten knee.  Able postop day 1. Will check wound tomorrow. 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

## 2014-05-12 ENCOUNTER — Encounter (HOSPITAL_COMMUNITY): Payer: Self-pay | Admitting: Vascular Surgery

## 2014-05-12 ENCOUNTER — Telehealth: Payer: Self-pay | Admitting: Vascular Surgery

## 2014-05-12 DIAGNOSIS — Z89512 Acquired absence of left leg below knee: Secondary | ICD-10-CM

## 2014-05-12 LAB — CBC
HCT: 33.8 % — ABNORMAL LOW (ref 36.0–46.0)
Hemoglobin: 10.4 g/dL — ABNORMAL LOW (ref 12.0–15.0)
MCH: 26.9 pg (ref 26.0–34.0)
MCHC: 30.8 g/dL (ref 30.0–36.0)
MCV: 87.3 fL (ref 78.0–100.0)
Platelets: 225 10*3/uL (ref 150–400)
RBC: 3.87 MIL/uL (ref 3.87–5.11)
RDW: 20.5 % — ABNORMAL HIGH (ref 11.5–15.5)
WBC: 11.9 10*3/uL — AB (ref 4.0–10.5)

## 2014-05-12 LAB — GLUCOSE, CAPILLARY
GLUCOSE-CAPILLARY: 115 mg/dL — AB (ref 70–99)
GLUCOSE-CAPILLARY: 136 mg/dL — AB (ref 70–99)
GLUCOSE-CAPILLARY: 74 mg/dL (ref 70–99)
Glucose-Capillary: 122 mg/dL — ABNORMAL HIGH (ref 70–99)
Glucose-Capillary: 116 mg/dL — ABNORMAL HIGH (ref 70–99)
Glucose-Capillary: 168 mg/dL — ABNORMAL HIGH (ref 70–99)
Glucose-Capillary: 158 mg/dL — ABNORMAL HIGH (ref 70–99)
Glucose-Capillary: 162 mg/dL — ABNORMAL HIGH (ref 70–99)

## 2014-05-12 LAB — BASIC METABOLIC PANEL
ANION GAP: 9 (ref 5–15)
BUN: 52 mg/dL — ABNORMAL HIGH (ref 6–23)
CALCIUM: 8.1 mg/dL — AB (ref 8.4–10.5)
CO2: 29 mmol/L (ref 19–32)
Chloride: 97 mmol/L (ref 96–112)
Creatinine, Ser: 4.52 mg/dL — ABNORMAL HIGH (ref 0.50–1.10)
GFR, EST AFRICAN AMERICAN: 10 mL/min — AB (ref >90)
GFR, EST NON AFRICAN AMERICAN: 9 mL/min — AB (ref >90)
Glucose, Bld: 151 mg/dL — ABNORMAL HIGH (ref 70–99)
Potassium: 5 mmol/L (ref 3.5–5.1)
Sodium: 135 mmol/L (ref 135–145)

## 2014-05-12 LAB — HEMOGLOBIN A1C
Hgb A1c MFr Bld: 7 % — ABNORMAL HIGH (ref 4.8–5.6)
Mean Plasma Glucose: 154 mg/dL

## 2014-05-12 MED ORDER — DARBEPOETIN ALFA 200 MCG/0.4ML IJ SOSY: 200.0000 ug | PREFILLED_SYRINGE | INTRAMUSCULAR | Status: DC

## 2014-05-12 MED ORDER — NEPRO/CARBSTEADY PO LIQD
237.0000 mL | Freq: Every day | ORAL | Status: DC
  Administered 2014-05-12: 237 mL via ORAL

## 2014-05-12 MED ORDER — PRO-STAT SUGAR FREE PO LIQD
30.0000 mL | Freq: Two times a day (BID) | ORAL | Status: DC
  Administered 2014-05-12: 30 mL via ORAL
  Filled 2014-05-12 (×3): qty 30

## 2014-05-12 MED ORDER — DOXERCALCIFEROL 4 MCG/2ML IV SOLN
1.0000 ug | INTRAVENOUS | Status: DC
  Administered 2014-05-13: 1 ug via INTRAVENOUS
  Filled 2014-05-12: qty 2

## 2014-05-12 NOTE — Clinical Social Work Psychosocial (Addendum)
Clinical Social Work Department BRIEF PSYCHOSOCIAL ASSESSMENT 05/12/2014  Patient:  Elizabeth [name],Elizabeth [name]     Account Number:  [id number]     Admit date:  05/09/2014  Clinical Social Worker:  Delmer Islam  Date/Time:  05/12/2014 12:04 PM  Referred by:  Physician  Date Referred:  05/10/2014 Referred for  SNF Placement   Other Referral:   Interview type:  Patient Other interview type:    PSYCHOSOCIAL DATA Living Status:  Facility Admitted from facility: [facility]  Level of care:  skilled Primary support name:  Valorie Roosevelt Primary support relationship to patient:  FAMILY Degree of support available:   [name] is patient's grandaughter and CSW informed by patient that she can be contacted [phone]). Patient also has 2 other family members that live in [place] - [name]-niece and [name].    CURRENT CONCERNS Current Concerns  Post-Acute Placement   Other Concerns:    SOCIAL WORK ASSESSMENT / PLAN CSW talked with patient regarding discharge planning. Patient was lying in bed, alert, oriented and agreeable to talking with CSW. Patient confirmed that she did not want to return to [facility], however when facility search was discussed, she is amenable to going to [facility] if inpatient rehab does not accept her.   Assessment/plan status:  Psychosocial Support/Ongoing Assessment of Needs Other assessment/ plan:  Per CIR admissions staff on 05/12/14, patient not appropriate for CIR as she will be going home alone after therapy.  Information/referral to community resources:   Patient given skilled facility list for [facility]    PATIENT'S/FAMILY'S RESPONSE TO PLAN OF CARE: Patient 's first choice for rehab is [facility] inpatient rehab, then a skilled facility.       Genelle Bal, MSW, LCSW Licensed Clinical Social Worker Clinical Social Work Department [employer] [phone]

## 2014-05-12 NOTE — Progress Notes (Signed)
Inpatient Rehabilitation  We received a screen request from PT for possible IP Rehab.  I note that an order has been placed for IP Rehab consult.  Will await recommendations from PMR MD. Admissions coordinator will follow up accordingly.  Thanks you!  Weldon Picking PT Inpatient Rehab Admissions Coordinator Cell [phone] Office [phone]

## 2014-05-12 NOTE — Care Management Note (Signed)
CARE MANAGEMENT NOTE 05/12/2014  Patient:  Elizabeth [name],Elizabeth [name]   Account Number:  [id number]  Date Initiated:  05/12/2014  Documentation initiated by:  Zorawar Strollo  Subjective/Objective Assessment:   cm following for progression and d/c planning.     Action/Plan:   05/12/14 Met with pt re d/c plan, pt aware that she will need rehab, interested in CIR however has no help at home after d/c. CSW, V Crawford alerted and will see pt re d/c plans.   Anticipated DC Date:  05/15/2014   Anticipated DC Plan:  SKILLED NURSING FACILITY         Choice offered to / List presented to:             Status of service:  In process, will continue to follow Medicare Important Message given?  YES (If response is "NO", the following Medicare IM given date fields will be blank) Date Medicare IM given:  05/12/2014 Medicare IM given by:  Cahterine Heinzel Date Additional Medicare IM given:   Additional Medicare IM given by:    Discharge Disposition:    Per UR Regulation:    If discussed at Long Length of Stay Meetings, dates discussed:    Comments:

## 2014-05-12 NOTE — Care Management Note (Deleted)
CARE MANAGEMENT NOTE 05/12/2014  Patient:  Elizabeth [name]   Account Number:  [id number]  Date Initiated:  05/12/2014  Documentation initiated by:  Arden Tinoco  Subjective/Objective Assessment:   CM following for progression and d/c planning.     Action/Plan:   Met with pt re d/c plans, pt requesting HHPT and HHOT. Will follow for PT/OT recommendations.   Anticipated DC Date:  05/14/2014   Anticipated DC Plan:  [place]         Choice offered to / List presented to:             Status of service:   Medicare Important Message given?  YES (If response is "NO", the following Medicare IM given date fields will be blank) Date Medicare IM given:  05/12/2014 Medicare IM given by:  Miyanna Wiersma Date Additional Medicare IM given:   Additional Medicare IM given by:    Discharge Disposition:    Per UR Regulation:    If discussed at Long Length of Stay Meetings, dates discussed:    Comments:

## 2014-05-12 NOTE — Progress Notes (Signed)
INITIAL NUTRITION ASSESSMENT  Pt meets criteria for SEVERE MALNUTRITION in the context of chronic illness as evidenced by severe fat and muscle mass loss.  DOCUMENTATION CODES Per approved criteria  -Severe malnutrition in the context of chronic illness -Underweight   INTERVENTION: Provide 30 ml Prostat po BID, each dose provides 100 kcal and 15 grams of protein.  Provide Nepro Shake po once daily, each supplement provides 425 kcal and 19 grams protein.  Encourage adequate PO intake.  NUTRITION DIAGNOSIS: Malnutrition related to chronic illness as evidenced by severe fat and muscle mass loss.   Goal: Pt to meet >/= 90% of their estimated nutrition needs   Monitor:  PO intake, weight trends, labs, I/O's  Reason for Assessment: Low BMI  69 y.o. female  Admitting Dx: <principal problem not specified>  ASSESSMENT: Pt with hx of HL, ESRD on HD (last dialysis was yesterday), here presenting with left foot infection. She had a left second toe gangrene in February and had an amputation. Wound became infected. Left foot x ray showed Acute osteomyelitis involving in the residual 2nd metatarsal, 3rd metatarsal head, and 3rd proximal phalanx.  PROCEDURE (3/19): Left below-knee amputation  Pt reports her appetite is fine. Meal completion has been 50%. Pt reports PTA at her rehab facility, she would try eating 3 meals a day however reported she did not like the food served there. Pt has been drinking Ensure at the rehab center at least 2 daily. Pt currently has Prostat ordered TID and has been taking them. Pt requests she would like Nepro as she usually drink them. RD to order. Pt was encouraged to eat her food at meals and to drink her supplements. Pt additionally asked about fried foods consumption in accordance with her diet. Questions were appropriately answered.  Nutrition Focused Physical Exam:  Subcutaneous Fat:  Orbital Region: N/A Upper Arm Region: Severe depletion Thoracic and  Lumbar Region: Severe depletion  Muscle:  Temple Region: Severe depletion Clavicle Bone Region: Severe depletion Clavicle and Acromion Bone Region: Severe depletion Scapular Bone Region: N/A Dorsal Hand: Severe depletion Patellar Region: Severe depletion Anterior Thigh Region: Severe depletion Posterior Calf Region: Severe depletion  Edema: none  Labs: Low calcium and GFR. High BUN and creatinine  Height: Ht Readings from Last 1 Encounters:  05/09/14 5\' 3"  (1.6 m)    Weight: Wt Readings from Last 1 Encounters:  05/11/14 73 lb 13.7 oz (33.5 kg)    Ideal Body Weight: 108 lbs---adjusted for L BKA  % Ideal Body Weight: 68%  Wt Readings from Last 10 Encounters:  05/11/14 73 lb 13.7 oz (33.5 kg)  04/28/14 84 lb (38.102 kg)  04/18/14 84 lb (38.102 kg)  04/09/14 84 lb (38.102 kg)  03/28/14 85 lb 8.6 oz (38.8 kg)  02/18/14 86 lb (39.009 kg)  12/28/13 82 lb 14.3 oz (37.6 kg)  12/27/13 84 lb (38.102 kg)  10/24/13 90 lb 6.2 oz (41 kg)  10/02/13 84 lb 9.6 oz (38.374 kg)    Usual Body Weight: 85 lbs  % Usual Body Weight: 86%  BMI:  Body mass index is 13.09 kg/(m^2). 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, incision on left leg  Diet Order: Diet renal/carb modified with 1200 ml fluid restriction  EDUCATION NEEDS: -Education needs addressed   Intake/Output Summary (Last 24 hours) at 05/12/14 1220 Last data filed at 05/12/14 0846  Gross per 24 hour  Intake  820 ml  Output     50 ml  Net    770 ml    Last BM: 3/19  Labs:   Recent Labs Lab 05/10/14 0500 05/11/14 0811 05/12/14 1027  NA 136 136 135  K 3.5 4.5 5.0  CL 95* 98 97  CO2 BUN 48* 24* 52*  CREATININE 3.92* 3.12* 4.52*  CALCIUM 8.0* 8.2* 8.1*  GLUCOSE 45* 114* 151*    CBG (last 3)   Recent Labs  05/12/14 0410 05/12/14 0727 05/12/14 1057  GLUCAP 116* 136* 168*    Scheduled  Meds: . carvedilol  6.25 mg Oral BID  . [START ON 05/15/2014] darbepoetin (ARANESP) injection - DIALYSIS  200 mcg Intravenous Q Thu-HD  . docusate sodium  100 mg Oral Daily  . [START ON 05/13/2014] doxercalciferol  1 mcg Intravenous Q T,Th,Sa-HD  . feeding supplement (PRO-STAT SUGAR FREE 64)  30 mL Oral TID WC  . heparin  5,000 Units Subcutaneous 3 times per day  . insulin aspart  0-15 Units Subcutaneous TID WC  . insulin glargine  7 Units Subcutaneous Q2200  . pantoprazole  40 mg Oral Daily  . pravastatin  20 mg Oral Daily  . saccharomyces boulardii  250 mg Oral BID  . sevelamer carbonate  800 mg Oral TID WC    Continuous Infusions:   Past Medical History  Diagnosis Date  . Hyperlipidemia   . Urinary retention     DX NEUROGENIC BLADDER--HAS INDWELLING FOLEY CATHETER  . Arthritis   . Anemia   . CAD (coronary artery disease)   . Neuropathic pain   . Stroke     patient not aware.  . Foley catheter in place     for urinary retention  . Critical lower limb ischemia   . Hypertension   . Peripheral vascular disease   . Hypoglycemia 08/19/2013  . Hypokalemia 08/19/2013  . Diabetes mellitus     TAKES INSULIN   . ESRD (end stage renal disease)     [address], S    Past Surgical History  Procedure Laterality Date  . Insertion of suprapubic catheter N/A 05/16/2012    Procedure: INSERTION OF SUPRAPUBIC CATHETER;  Surgeon: Sebastian Ache, MD;  Location: [facility] ORS;  Service: Urology;  Laterality: N/A;  . Bascilic vein transposition Left 10/30/2012    Procedure: LEFT 1ST STAGE BASCILIC VEIN TRANSPOSITION;  Surgeon: Chuck Hint, MD;  Location: [facility] OR;  Service: Vascular;  Laterality: Left;  . Nm myocar perf wall motion  04/19/2012    low risk study-EF 44%,mild inferolateral hypkinesis  . Bascilic vein transposition Left 02/05/2013    Procedure: BASCILIC VEIN TRANSPOSITION 2ND STAGE;  Surgeon: Chuck Hint, MD;  Location: [facility] OR;  Service: Vascular;  Laterality:  Left;  . Radiology with anesthesia N/A 10/25/2013    Procedure: RADIOLOGY WITH ANESTHESIA;  Surgeon: Medication Radiologist, MD;  Location: [facility] OR;  Service: Radiology;  Laterality: N/A;  . Fistulogram Left 04/29/2013    Procedure: FISTULOGRAM;  Surgeon: Chuck Hint, MD;  Location: [facility] CATH LAB;  Service: Cardiovascular;  Laterality: Left;  . Angioplasty Left 04/29/2013    Procedure: ANGIOPLASTY;  Surgeon: Chuck Hint, MD;  Location[facility] CATH LAB;  Service: Cardiovascular;  Laterality: Left;  . Abdominal aortagram N/A 03/19/2014    Procedure: ABDOMINAL Ronny Flurry;  Surgeon: Sherren Kerns, MD;  Location: [facility] CATH LAB;  Service: Cardiovascular;  Laterality: N/A;  . Left and right heart catheterization with coronary angiogram  N/A 03/24/2014    Procedure: LEFT AND RIGHT HEART CATHETERIZATION WITH CORONARY ANGIOGRAM;  Surgeon: Marykay Lex, MD;  Location: [facility] CATH LAB;  Service: Cardiovascular;  Laterality: N/A;  . Endarterectomy popliteal Left 03/26/2014    Procedure: Left popliteal and peroneal artery endarterectomy with vein patch angioplasty;  Surgeon: Sherren Kerns, MD;  Location: [facility] OR;  Service: Vascular;  Laterality: Left;  . Amputation Left 03/26/2014    Procedure: AMPUTATION SECOND LEFT TOE;  Surgeon: Sherren Kerns, MD;  Location: [facility] OR;  Service: Vascular;  Laterality: Left;  . Av fistula placement Left 04/28/2014    Procedure: CREATION OF LEFT BRACHIOCEPHALIC  ARTERIOVENOUS (AV) FISTULA CREATION;  Surgeon: Fransisco Hertz, MD;  Location: [facility] OR;  Service: Vascular;  Laterality: Left;  . Amputation Left 05/10/2014    Procedure: AMPUTATION BELOW KNEE;  Surgeon: Larina Earthly, MD;  Location: [facility] OR;  Service: Vascular;  Laterality: Left;    Marijean Niemann, MS, RD, LDN Pager # [phone] After hours/ weekend pager # [phone]

## 2014-05-12 NOTE — Progress Notes (Signed)
Pt request items in security lock box be returned to her. All belongings given back to pt.    Jonell Cluck, RN

## 2014-05-12 NOTE — Progress Notes (Signed)
PROGRESS NOTE  Elizabeth [name] TOI:[id number] DOB: 09/29/1945 DOA: 05/09/2014 PCP: [name], MD  Assessment/Plan: Osteomyelitis left foot: --s/p amputation --d/c abx -PT-CIR   ESRD on hemodialysis. Nephrology consulted for continuation of hemodialysis.  Insulin dependent Diabetes mellitus. a1c pending, continue insulin.  Essential hypertension. Continue carvedilol.   Chronic foley  Code Status: full Family Communication: called granddaughter Disposition Plan: CIR   Consultants:  vascular  Procedures:      HPI/Subjective: Eating breakfast- doing better than yesterday   Objective: Filed Vitals:   05/12/14 0839  BP: 92/45  Pulse: 89  Temp: 98.2 F (36.8 C)  Resp: 16    Intake/Output Summary (Last 24 hours) at 05/12/14 1023 Last data filed at 05/12/14 0846  Gross per 24 hour  Intake    820 ml  Output     50 ml  Net    770 ml   Filed Weights   05/10/14 0630 05/10/14 1008 05/11/14 2055  Weight: 37.2 kg (82 lb 0.2 oz) 36.6 kg (80 lb 11 oz) 33.5 kg (73 lb 13.7 oz)    Exam:   General:  Awake, alert  Cardiovascular: rrr  Respiratory: clear  Abdomen: +BS, soft  Musculoskeletal: no edema  Data Reviewed: Basic Metabolic Panel:  Recent Labs Lab 05/09/14 1640 05/10/14 0500 05/11/14 0811  NA 135 136 136  K 3.7 3.5 4.5  CL 93* 95* 98  CO2 30 31 29   GLUCOSE 299* 45* 114*  BUN 43* 48* 24*  CREATININE 3.49* 3.92* 3.12*  CALCIUM 8.4 8.0* 8.2*   Liver Function Tests:  Recent Labs Lab 05/09/14 1640  AST 14  ALT 11  ALKPHOS 152*  BILITOT 0.4  PROT 7.0  ALBUMIN 2.1*   No results for input(s): LIPASE, AMYLASE in the last 168 hours. No results for input(s): AMMONIA in the last 168 hours. CBC:  Recent Labs Lab 05/09/14 1640 05/10/14 0500 05/11/14 0811  WBC 8.5 7.7 9.7  NEUTROABS 6.7  --   --   HGB 11.4* 10.8* 10.8*  HCT 37.1 35.5* 37.1  MCV 87.1 86.6 90.3  PLT 252 271 213   Cardiac Enzymes: No results for input(s): CKTOTAL,  CKMB, CKMBINDEX, TROPONINI in the last 168 hours. BNP (last 3 results) No results for input(s): BNP in the last 8760 hours.  ProBNP (last 3 results) No results for input(s): PROBNP in the last 8760 hours.  CBG:  Recent Labs Lab 05/11/14 2053 05/12/14 0013 05/12/14 0140 05/12/14 0410 05/12/14 0727  GLUCAP 74 115* 122* 116* 136*    Recent Results (from the past 240 hour(s))  Blood culture (routine x 2)     Status: None (Preliminary result)   Collection Time: 05/09/14  4:40 PM  Result Value Ref Range Status   Specimen Description BLOOD RIGHT FOREARM  Final   Special Requests BOTTLES DRAWN AEROBIC AND ANAEROBIC 5CC  Final   Culture   Final           BLOOD CULTURE RECEIVED NO GROWTH TO DATE CULTURE WILL BE HELD FOR 5 DAYS BEFORE ISSUING A FINAL NEGATIVE REPORT Performed at [facility]    Report Status PENDING  Incomplete  Blood culture (routine x 2)     Status: None (Preliminary result)   Collection Time: 05/09/14  5:20 PM  Result Value Ref Range Status   Specimen Description BLOOD ARM RIGHT  Final   Special Requests BOTTLES DRAWN AEROBIC AND ANAEROBIC 5CC  Final   Culture   Final  BLOOD CULTURE RECEIVED NO GROWTH TO DATE CULTURE WILL BE HELD FOR 5 DAYS BEFORE ISSUING A FINAL NEGATIVE REPORT Performed at [facility]    Report Status PENDING  Incomplete  Surgical pcr screen     Status: None   Collection Time: 05/09/14 10:14 PM  Result Value Ref Range Status   MRSA, PCR NEGATIVE NEGATIVE Final   Staphylococcus aureus NEGATIVE NEGATIVE Final    Comment:        The Xpert SA Assay (FDA approved for NASAL specimens in patients over 1 years of age), is one component of a comprehensive surveillance program.  Test performance has been validated by [facility] for patients greater than or equal to 15 year old. It is not intended to diagnose infection nor to guide or monitor treatment.      Studies: No results found.  Scheduled Meds: .  carvedilol  6.25 mg Oral BID  . [START ON 05/15/2014] darbepoetin (ARANESP) injection - DIALYSIS  200 mcg Intravenous Q Thu-HD  . docusate sodium  100 mg Oral Daily  . [START ON 05/13/2014] doxercalciferol  1 mcg Intravenous Q T,Th,Sa-HD  . feeding supplement (PRO-STAT SUGAR FREE 64)  30 mL Oral TID WC  . heparin  5,000 Units Subcutaneous 3 times per day  . insulin aspart  0-15 Units Subcutaneous TID WC  . insulin glargine  7 Units Subcutaneous Q2200  . pantoprazole  40 mg Oral Daily  . pravastatin  20 mg Oral Daily  . saccharomyces boulardii  250 mg Oral BID  . sevelamer carbonate  800 mg Oral TID WC   Continuous Infusions:  Antibiotics Given (last 72 hours)    Date/Time Action Medication Dose Rate   05/09/14 1902 Given   vancomycin (VANCOCIN) IVPB 750 mg/150 ml premix 750 mg 150 mL/hr   05/10/14 0151 Given   piperacillin-tazobactam (ZOSYN) IVPB 2.25 g 2.25 g 100 mL/hr   05/10/14 0905 Given   vancomycin (VANCOCIN) 500 mg in sodium chloride 0.9 % 100 mL IVPB 500 mg 100 mL/hr      Active Problems:   Anemia of chronic disease   End stage renal disease-on HD   DM type 2 (diabetes mellitus, type 2)   Osteomyelitis    Time spent: 25 min    Ladarrius Bogdanski  [facility] Pager [phone]. If 7PM-7AM, please contact night-coverage at [email or web address], password [facility] 05/12/2014, 10:23 AM  LOS: 3 days

## 2014-05-12 NOTE — Progress Notes (Addendum)
  Progress Note    05/12/2014 7:44 AM 2 Days Post-Op  Subjective:  No complaints  Tm 99.6 now afebrile VSS   Filed Vitals:   05/12/14 0440  BP: 100/54  Pulse: 109  Temp: 98.8 F (37.1 C)  Resp: 16    Physical Exam: Incisions:  C/d/i with staples in tact.  Minimal dried blood on bandage removed. Extremities:  Good flexion and extension of left knee.  CBC    Component Value Date/Time   WBC 9.7 05/11/2014 0811   RBC 4.11 05/11/2014 0811   RBC 3.49* 12/25/2013 0545   HGB 10.8* 05/11/2014 0811   HCT 37.1 05/11/2014 0811   PLT 213 05/11/2014 0811   MCV 90.3 05/11/2014 0811   MCH 26.3 05/11/2014 0811   MCHC 29.1* 05/11/2014 0811   RDW 21.0* 05/11/2014 0811   LYMPHSABS 0.8 05/09/2014 1640   MONOABS 0.8 05/09/2014 1640   EOSABS 0.1 05/09/2014 1640   BASOSABS 0.1 05/09/2014 1640    BMET    Component Value Date/Time   NA 136 05/11/2014 0811   K 4.5 05/11/2014 0811   CL 98 05/11/2014 0811   CO2 29 05/11/2014 0811   GLUCOSE 114* 05/11/2014 0811   BUN 24* 05/11/2014 0811   CREATININE 3.12* 05/11/2014 0811   CALCIUM 8.2* 05/11/2014 0811   GFRNONAA 14* 05/11/2014 0811   GFRAA 16* 05/11/2014 0811    INR    Component Value Date/Time   INR 1.21 03/24/2014 0550     Intake/Output Summary (Last 24 hours) at 05/12/14 0744 Last data filed at 05/11/14 2100  Gross per 24 hour  Intake    620 ml  Output     50 ml  Net    570 ml     Assessment/Plan:  69 y.o. female is s/p left below knee amputation  2 Days Post-Op  -pt doing well  -left stump is viable -pt would like to go to CIR for rehab if possible-i will place consult   Doreatha Massed, PA-C [facility] [phone] 05/12/2014 7:44 AM           I have examined the patient, reviewed and agree with above.  Shourya Macpherson, MD 05/12/2014 2:13 PM

## 2014-05-12 NOTE — Consult Note (Signed)
Physical Medicine and Rehabilitation Consult   Reason for Consult: L-AKA due to PAD Referring Physician:  Dr. Benjamine Mola   HPI: Elizabeth [name] is a 69 y.o. 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 Patient has been at [facility] since toe amputation.  Xrays in ED  Revealed acute osteomyelitis involving in the residual 2nd metatarsal, 3rd metatarsal head, 3rd proximal phalanx with associated pathologic fracture of the 3rd proximal phalanx.    ROS    Past Medical History  Diagnosis Date  . Hyperlipidemia   . Urinary retention     DX NEUROGENIC BLADDER--HAS INDWELLING FOLEY CATHETER  . Arthritis   . Anemia   . CAD (coronary artery disease)   . Neuropathic pain   . Stroke     patient not aware.  . Foley catheter in place     for urinary retention  . Critical lower limb ischemia   . Hypertension   . Peripheral vascular disease   . Hypoglycemia 08/19/2013  . Hypokalemia 08/19/2013  . Diabetes mellitus     TAKES INSULIN   . ESRD (end stage renal disease)     [address] T, TH, S    Past Surgical History  Procedure Laterality Date  . Insertion of suprapubic catheter N/A 05/16/2012    Procedure: INSERTION OF SUPRAPUBIC CATHETER;  Surgeon: Sebastian Ache, MD;  Location: [facility] ORS;  Service: Urology;  Laterality: N/A;  . Bascilic vein transposition Left 10/30/2012    Procedure: LEFT 1ST STAGE BASCILIC VEIN TRANSPOSITION;  Surgeon: Chuck Hint, MD;  Location: [facility] OR;  Service: Vascular;  Laterality: Left;  . Nm myocar perf wall motion  04/19/2012    low risk study-EF 44%,mild inferolateral hypkinesis  . Bascilic vein transposition Left 02/05/2013    Procedure: BASCILIC VEIN TRANSPOSITION 2ND STAGE;  Surgeon: Chuck Hint, MD;  Location: [facility] OR;  Service: Vascular;  Laterality: Left;  . Radiology with  anesthesia N/A 10/25/2013    Procedure: RADIOLOGY WITH ANESTHESIA;  Surgeon: Medication Radiologist, MD;  Location: [facility] OR;  Service: Radiology;  Laterality: N/A;  . Fistulogram Left 04/29/2013    Procedure: FISTULOGRAM;  Surgeon: Chuck Hint, MD;  Location: [facility] CATH LAB;  Service: Cardiovascular;  Laterality: Left;  . Angioplasty Left 04/29/2013    Procedure: ANGIOPLASTY;  Surgeon: Chuck Hint, MD;  Location: [facility] CATH LAB;  Service: Cardiovascular;  Laterality: Left;  . Abdominal aortagram N/A 03/19/2014    Procedure: ABDOMINAL Ronny Flurry;  Surgeon: Sherren Kerns, MD;  Location: [facility] CATH LAB;  Service: Cardiovascular;  Laterality: N/A;  . Left and right heart catheterization with coronary angiogram N/A 03/24/2014    Procedure: LEFT AND RIGHT HEART CATHETERIZATION WITH CORONARY ANGIOGRAM;  Surgeon: Marykay Lex, MD;  Location: [facility] CATH LAB;  Service: Cardiovascular;  Laterality: N/A;  . Endarterectomy popliteal Left 03/26/2014    Procedure: Left popliteal and peroneal artery endarterectomy with vein patch angioplasty;  Surgeon: Sherren Kerns, MD;  Location: [facility] OR;  Service: Vascular;  Laterality: Left;  . Amputation Left 03/26/2014    Procedure: AMPUTATION SECOND LEFT TOE;  Surgeon: Sherren Kerns, MD;  Location: [facility] OR;  Service: Vascular;  Laterality: Left;  . Av fistula placement Left 04/28/2014    Procedure: CREATION OF LEFT BRACHIOCEPHALIC  ARTERIOVENOUS (AV) FISTULA CREATION;  Surgeon: Fransisco Hertz, MD;  Location:  [facility] OR;  Service: Vascular;  Laterality: Left;    Family History  Problem Relation Age of Onset  . Hyperlipidemia Mother   . Hypertension Mother   . Hodgkin's lymphoma Father   . Heart disease Sister     Social History:  reports that she has never smoked. She has never used smokeless tobacco. She reports that she does not drink alcohol or use illicit drugs.    Allergies: No Known Allergies    Medications Prior to Admission  Medication Sig Dispense Refill  .  acetaminophen-codeine (TYLENOL #3) 300-30 MG per tablet Take 1 tablet by mouth every 6 (six) hours as needed for moderate pain. 30 tablet 0  . Amino Acids-Protein Hydrolys (FEEDING SUPPLEMENT, PRO-STAT SUGAR FREE 64,) LIQD Take 30 mLs by mouth 3 (three) times daily with meals.    Marland Kitchen aspirin EC 81 MG tablet Take 81 mg by mouth daily.    . carvedilol (COREG) 6.25 MG tablet take 1 tablet by mouth twice a day 60 tablet 11  . docusate sodium (COLACE) 100 MG capsule Take 1 capsule (100 mg total) by mouth daily. 10 capsule 0  . furosemide (LASIX) 40 MG tablet Take 40 mg by mouth daily.    Marland Kitchen glucose blood (ONETOUCH VERIO) test strip Use as instructed to check blood sugar 4 times per day dx code E13.10 150 each 3  . insulin aspart (NOVOLOG) 100 UNIT/ML injection Inject 0-15 Units into the skin 3 (three) times daily with meals. (Patient taking differently: Inject 3 Units into the skin 3 (three) times daily as needed for high blood sugar (CBG >150). ) 10 mL 11  . insulin glargine (LANTUS) 100 UNIT/ML injection Inject 0.05 mLs (5 Units total) into the skin daily. (Patient taking differently: Inject 7 Units into the skin daily at 10 pm. ) 10 mL 11  . loratadine (CLARITIN) 10 MG tablet Take 10 mg by mouth daily as needed for allergies.    . Multiple Vitamins-Minerals (DECUBI-VITE) CAPS Take 2 capsules by mouth daily.    Marland Kitchen NUTRITIONAL SUPPLEMENT LIQD Take 120 mLs by mouth 3 (three) times daily. 2 Calorie supplement    . ONETOUCH DELICA LANCETS FINE MISC Use to check blood sugar 4 times per day 200 each 3  . oxyCODONE-acetaminophen (PERCOCET/ROXICET) 5-325 MG per tablet Take 1 tablet by mouth every 8 (eight) hours as needed for moderate pain or severe pain. 30 tablet 0  . pravastatin (PRAVACHOL) 20 MG tablet Take 20 mg by mouth daily.    . promethazine (PHENERGAN) 12.5 MG tablet Take 1 tablet (12.5 mg total) by mouth every 6 (six) hours as needed for nausea or vomiting. 30 tablet 0  . saccharomyces boulardii  (FLORASTOR) 250 MG capsule Take 1 capsule (250 mg total) by mouth 2 (two) times daily.    . sevelamer carbonate (RENVELA) 800 MG tablet Take 1 tablet (800 mg total) by mouth 3 (three) times daily with meals.      Home: Home Living Family/patient expects to be discharged to:: Private residence Living Arrangements: Alone Available Help at Discharge: Family, Available PRN/intermittently Type of Home: Apartment Home Access: Elevator Home Layout: One level Home Equipment: Grab bars - tub/shower, Wheelchair - manual, Hospital bed Additional Comments: has tub/shower unit, standard height commode  Functional History: Prior Function Level of Independence: Independent with assistive device(s) Comments: used w/c or furniture walked around home, goes to HD in w/c (independently manages her foley catheter) Functional Status:  Mobility: Bed Mobility Overal bed mobility: Needs Assistance Bed  Mobility: Supine to Sit Supine to sit: Supervision General bed mobility comments: Supervision for safety, but no gross difficulty noted Transfers Overall transfer level: Needs assistance Equipment used: Rolling walker (2 wheeled) Transfers: Sit to/from Stand Sit to Stand: Min assist General transfer comment: min assist to steady once standing very good rise; cues for safety, noted pt had both hands on RW to push up Ambulation/Gait Ambulation/Gait assistance: Mod assist Ambulation Distance (Feet):  (small pivot hop stpes bed to chair) Assistive device: Rolling walker (2 wheeled) General Gait Details: Cues for technique and safety; made small hops on R LE which seemed jarring    ADL:    Cognition: Cognition Overall Cognitive Status: Within Functional Limits for tasks assessed Orientation Level: Oriented X4 Cognition Arousal/Alertness: Awake/alert Behavior During Therapy: WFL for tasks assessed/performed Overall Cognitive Status: Within Functional Limits for tasks assessed  Blood pressure 100/54,  pulse 109, temperature 98.8 F (37.1 C), temperature source Oral, resp. rate 16, height  (1.6 m), weight 33.5 kg (73 lb 13.7 oz), SpO2 98 %. Physical Exam  Constitutional: She is oriented to person, place, and time.  Frail appearing  HENT:  Head: Normocephalic and atraumatic.  Eyes: EOM are normal. Pupils are equal, round, and reactive to light.  Cardiovascular: Normal rate.   No murmur heard. Respiratory: Effort normal. No respiratory distress. She has no wheezes.  GI: Soft. Bowel sounds are normal. She exhibits no distension. There is no tenderness. There is no rebound.  Musculoskeletal: She exhibits no edema or tenderness.  Neurological: She is alert and oriented to person, place, and time. No cranial nerve deficit. Coordination normal.  UE strength 4/5 prox to distal. LE: RLE: 3/5 hf, 4/5 ke and ankle. Able to lift leg off the bed with assistance--2+/5. No gross sensory deficits. Cognitively appropriate.  Psychiatric: She has a normal mood and affect. Her behavior is normal. Judgment and thought content normal.    Results for orders placed or performed during the hospital encounter of 05/09/14 (from the past 24 hour(s))  Glucose, capillary     Status: Abnormal   Collection Time: 05/11/14 11:37 AM  Result Value Ref Range   Glucose-Capillary 143 (H) 70 - 99 mg/dL  Glucose, capillary     Status: Abnormal   Collection Time: 05/11/14  4:56 PM  Result Value Ref Range   Glucose-Capillary 188 (H) 70 - 99 mg/dL  Glucose, capillary     Status: None   Collection Time: 05/11/14  8:53 PM  Result Value Ref Range   Glucose-Capillary 74 70 - 99 mg/dL  Glucose, capillary     Status: Abnormal   Collection Time: 05/12/14 12:13 AM  Result Value Ref Range   Glucose-Capillary 115 (H) 70 - 99 mg/dL  Glucose, capillary     Status: Abnormal   Collection Time: 05/12/14  1:40 AM  Result Value Ref Range   Glucose-Capillary 122 (H) 70 - 99 mg/dL  Glucose, capillary     Status: Abnormal    Collection Time: 05/12/14  4:10 AM  Result Value Ref Range   Glucose-Capillary 116 (H) 70 - 99 mg/dL  Glucose, capillary     Status: Abnormal   Collection Time: 05/12/14  7:27 AM  Result Value Ref Range   Glucose-Capillary 136 (H) 70 - 99 mg/dL   No results found.  Assessment/Plan: Diagnosis: left bka 1. Does the need for close, 24 hr/day medical supervision in concert with the patient's rehab needs make it unreasonable for this patient to be served in a less  intensive setting? Yes 2. Co-Morbidities requiring supervision/potential complications: ESRD, DM2, ACD 3. Due to bladder management, bowel management, safety, skin/wound care, disease management, medication administration, pain management and patient education, does the patient require 24 hr/day rehab nursing? Yes 4. Does the patient require coordinated care of a physician, rehab nurse, PT (1-2 hrs/day, 5 days/week) and OT (1-2 hrs/day, 5 days/week) to address physical and functional deficits in the context of the above medical diagnosis(es)? Yes Addressing deficits in the following areas: balance, endurance, locomotion, strength, transferring, bowel/bladder control, bathing, dressing, feeding, grooming, toileting and psychosocial support 5. Can the patient actively participate in an intensive therapy program of at least 3 hrs of therapy per day at least 5 days per week? Yes 6. The potential for patient to make measurable gains while on inpatient rehab is excellent 7. Anticipated functional outcomes upon discharge from inpatient rehab are modified independent  with PT, modified independent with OT, n/a with SLP. 8. Estimated rehab length of stay to reach the above functional goals is: 8-12 days 9. Does the patient have adequate social supports and living environment to accommodate these discharge functional goals? Yes 10. Anticipated D/C setting: Home 11. Anticipated post D/C treatments: HH therapy and Outpatient therapy 12. Overall  Rehab/Functional Prognosis: excellent  RECOMMENDATIONS: This patient's condition is appropriate for continued rehabilitative care in the following setting: CIR Patient has agreed to participate in recommended program. Yes Note that insurance prior authorization may be required for reimbursement for recommended care.  Comment: Rehab Admissions Coordinator to follow up.  Thanks,  Ranelle Oyster, [place], [place]     05/12/2014

## 2014-05-12 NOTE — Progress Notes (Signed)
Subjective:   "today is a good day"  Eating breakfast.   Objective Filed Vitals:   05/11/14 1657 05/11/14 2055 05/12/14 0440 05/12/14 0839  BP: 103/52 101/57 100/54 92/45  Pulse: 95 113 109 89  Temp: 99.6 F (37.6 C) 99.6 F (37.6 C) 98.8 F (37.1 C) 98.2 F (36.8 C)  TempSrc: Oral Oral Oral Oral  Resp: 17 18 16 16   Height:      Weight:  33.5 kg (73 lb 13.7 oz)    SpO2: 100% 98% 98% 99%   Physical Exam General: alert and oriented, no acute distress.  Heart: RRR Lungs: CTA, unlabored  Abdomen: soft, nontender +BS  Extremities: L BKA- wrapped. RLE no edema Dialysis Access: R IJ perm cath / L UA developing avf pos bruit   TTS [facility] 3hr 30 mins    2K/2.25 Ca+  36.5 kgs   300/800   3700 u heparin Profile 4 Aranesp 200 q week (thursday)   hectorol 1  Assessment/Plan: 1. Infected L toe Amputation now s/pt L BKA 3/19 - Dr. Arbie Cookey- plan for inpt rehab  2. ESRD - HD TTS [facility] ) Next hd tues K+4.5 3. Anemia - HGB 10.8 On ESA- cont  4. Secondary hyperparathyroidism - CA+ 8.2 On Renvela./ Hectorol  5. HTN/volume - 92/45 Am bp on Carvedilol 6.25mg  bid / will have lower edw with BKA 6. Nutrition- renal diet. On prostat add Breeze supplement- eating better   7. DM type 2- per admit 8. dispo- consult pending for inpt rehab  Jetty Duhamel, NP [facility] Beeper [phone] 05/12/2014,9:07 AM  LOS: 3 days   Pt seen, examined and agree w A/P as above.  Vinson Moselle MD pager [phone]    cell [phone] 05/12/2014, 1:51 PM    Additional Objective Labs: Basic Metabolic Panel:  Recent Labs Lab 05/09/14 1640 05/10/14 0500 05/11/14 0811  NA 135 136 136  K 3.7 3.5 4.5  CL 93* 95* 98  CO2 30 31 29   GLUCOSE 299* 45* 114*  BUN 43* 48* 24*  CREATININE 3.49* 3.92* 3.12*  CALCIUM 8.4 8.0* 8.2*   Liver Function Tests:  Recent Labs Lab 05/09/14 1640  AST 14  ALT 11  ALKPHOS 152*  BILITOT 0.4  PROT 7.0  ALBUMIN 2.1*   No results for input(s):  LIPASE, AMYLASE in the last 168 hours. CBC:  Recent Labs Lab 05/09/14 1640 05/10/14 0500 05/11/14 0811  WBC 8.5 7.7 9.7  NEUTROABS 6.7  --   --   HGB 11.4* 10.8* 10.8*  HCT 37.1 35.5* 37.1  MCV 87.1 86.6 90.3  PLT 252 271 213   Blood Culture    Component Value Date/Time   SDES BLOOD ARM RIGHT 05/09/2014 1720   SPECREQUEST BOTTLES DRAWN AEROBIC AND ANAEROBIC 5CC 05/09/2014 1720   CULT  05/09/2014 1720           BLOOD CULTURE RECEIVED NO GROWTH TO DATE CULTURE WILL BE HELD FOR 5 DAYS BEFORE ISSUING A FINAL NEGATIVE REPORT Performed at [facility]    REPTSTATUS PENDING 05/09/2014 1720    Cardiac Enzymes: No results for input(s): CKTOTAL, CKMB, CKMBINDEX, TROPONINI in the last 168 hours. CBG:  Recent Labs Lab 05/11/14 2053 05/12/14 0013 05/12/14 0140 05/12/14 0410 05/12/14 0727  GLUCAP 74 115* 122* 116* 136*   Iron Studies: No results for input(s): IRON, TIBC, TRANSFERRIN, FERRITIN in the last 72 hours. @lablastinr3 @ Studies/Results: No results found. Medications:   . carvedilol  6.25 mg Oral BID  .  docusate sodium  100 mg Oral Daily  . feeding supplement (PRO-STAT SUGAR FREE 64)  30 mL Oral TID WC  . heparin  5,000 Units Subcutaneous 3 times per day  . insulin aspart  0-15 Units Subcutaneous TID WC  . insulin glargine  7 Units Subcutaneous Q2200  . pantoprazole  40 mg Oral Daily  . pravastatin  20 mg Oral Daily  . saccharomyces boulardii  250 mg Oral BID  . sevelamer carbonate  800 mg Oral TID WC

## 2014-05-12 NOTE — Progress Notes (Signed)
Rehab admissions - I met with pt in follow up to rehab [place] consult to explain the possibility of inpatient rehab. I also spoke about these details by phone with pt's dtr April (who lives in [place]) and pt's grand-dtr Ashby Dawes (who is local).   Rehab [place] projected that pt's length of stay would be 8-12 days with Mod Ind goals. In discussion with pt and her family, they are not comfortable with pt going back to her apartment, living home alone after an 8-12 day rehab stay. Pt and her dtrs would then want pt to go to SNF. Noted that pt has been at [facility] since toe amputation in February.  Based on pt/family feedback, I had further discussion with rehab [place] and Dr. Naaman Plummer is also recommending that SNF be pursued.  Pt/family were all in agreement with this determination for SNF and I explained that Lorriane Shire, Education officer, museum will be following up with them for discharge planning.  I will now sign off pt's case. I updated Lorriane Shire, Education officer, museum and she will update Malachy Mood, Tourist information centre manager.  Thanks.  Nanetta Batty, PT Rehabilitation Admissions Coordinator [phone]

## 2014-05-12 NOTE — Progress Notes (Deleted)
Inpatient Rehabilitation  Patient was screened by [facility] for appropriateness for an Inpatient Acute Rehab consult.  At this time, we are recommending Inpatient Rehab consult.  Please order when you feel appropriate.   Kailani Brass PT Inpatient Rehab Admissions Coordinator Cell [phone] Office [phone]   

## 2014-05-12 NOTE — Evaluation (Signed)
Occupational Therapy Evaluation Patient Details Name: Elizabeth [name] MRN: [id number] DOB: 1945-12-20 Today's Date: 05/12/2014    History of Present Illness Pt is a 69 y.o. Female with PMH HLD, urinary retention with indwelling foley cath, CAD, neuropathic pain, CVA, PVD, HTN, DM, ESRD on HD T/Th/S admitted 05/09/14 from SNF with L toe infection. 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    Clinical Impression   PTA pt was at SNF but was previously independent with use of RW and w/c. Pt is highly motivated to return to independence. Pt required min A for transfers. She requires assistance for LB ADLs due to decreased dynamic sitting balance. Educated pt in pre-prosthetic activities including desensitization of residual limb, no pillow under knee, and quad extension. Pt will benefit from acute OT to address functional mobility and LB ADLs and is an excellent CIR candidate due to her motivation to facilitate d/c home with  Family support.     Follow Up Recommendations  CIR    Equipment Recommendations  3 in 1 bedside comode    Recommendations for Other Services       Precautions / Restrictions Precautions Precautions: Fall Restrictions Weight Bearing Restrictions: No      Mobility Bed Mobility Overal bed mobility: Needs Assistance Bed Mobility: Supine to Sit;Sit to Supine     Supine to sit: Supervision;HOB elevated Sit to supine: Supervision;HOB elevated   General bed mobility comments: SUpervision for safety. Use of Bed rails.   Transfers Overall transfer level: Needs assistance Equipment used: Rolling walker (2 wheeled) Transfers: Sit to/from [employer] Sit to Stand: Min assist Stand pivot transfers: Min assist       General transfer comment: Min A to sit<>stand x2 with good controlled ascent/descent and good hand placement. Pt able to take tiny hop steps, but UE weakness is limiting.     Balance Overall balance assessment: Needs assistance Sitting-balance  support: No upper extremity supported;Feet supported Sitting balance-Leahy Scale: Good     Standing balance support: Bilateral upper extremity supported;During functional activity Standing balance-Leahy Scale: Poor                              ADL Overall ADL's : Needs assistance/impaired Eating/Feeding: Independent;Sitting   Grooming: Set up;Sitting   Upper Body Bathing: Set up;Sitting   Lower Body Bathing: Minimal assistance;Sit to/from stand   Upper Body Dressing : Set up;Sitting   Lower Body Dressing: Moderate assistance;Sit to/from stand   Toilet Transfer: Minimal assistance;Stand-pivot;RW Statistician Details (indicate cue type and reason): pt able to take small hop steps to pivot Toileting- Clothing Manipulation and Hygiene: Maximal assistance;Sit to/from stand Toileting - Clothing Manipulation Details (indicate cue type and reason): Pt requires UE support to maintain balance. Pt is able to perform pericare with sitting/lateral leans.        General ADL Comments: Pt is extremely motivated to return to independence.      Vision Vision Assessment?: No apparent visual deficits          Pertinent Vitals/Pain Pain Assessment: No/denies pain     Hand Dominance Right   Extremity/Trunk Assessment Upper Extremity Assessment Upper Extremity Assessment: Generalized weakness   Lower Extremity Assessment Lower Extremity Assessment: Generalized weakness;Defer to PT evaluation LLE Deficits / Details: pt has good quad activation; able to sit EOB and extend L knee fully.    Cervical / Trunk Assessment Cervical / Trunk Assessment: Normal   Communication Communication Communication: No difficulties  Cognition Arousal/Alertness: Awake/alert Behavior During Therapy: WFL for tasks assessed/performed Overall Cognitive Status: Within Functional Limits for tasks assessed                                Home [name] Family/patient expects to be  discharged to:: Private residence [name] Arrangements: Alone Available Help at Discharge: Family;Available PRN/intermittently (daughter can stay with pt) Type of Home: Apartment Home Access: Elevator     Home Layout: One level     Bathroom Shower/Tub: Tub/shower unit Shower/tub characteristics: Engineer, building services: Standard     Home Equipment: Grab bars - tub/shower;Wheelchair - manual;Hospital bed          Prior Functioning/Environment Level of Independence: Independent with assistive device(s)        Comments: used w/c or furniture walked around home, goes to HD in w/c    OT Diagnosis: Generalized weakness;Acute pain   OT Problem List: Decreased strength;Decreased activity tolerance;Decreased range of motion;Impaired balance (sitting and/or standing);Decreased knowledge of use of DME or AE;Decreased knowledge of precautions;Pain   OT Treatment/Interventions: Self-care/ADL training;Therapeutic exercise;Energy conservation;DME and/or AE instruction;Therapeutic activities;Patient/family education;Balance training    OT Goals(Current goals can be found in the care plan section) Acute Rehab OT Goals Patient Stated Goal: Would really like to go to CIR for rehab OT Goal Formulation: With patient Time For Goal Achievement: 05/26/14 Potential to Achieve Goals: Good ADL Goals Pt Will Perform Lower Body Bathing: with supervision;sit to/from stand Pt Will Perform Lower Body Dressing: with supervision;sit to/from stand Pt Will Transfer to Toilet: with supervision;bedside commode;stand pivot transfer Pt Will Perform Toileting - Clothing Manipulation and hygiene: with supervision;sit to/from stand Pt/caregiver will Perform Home Exercise Program: Increased strength;Both right and left upper extremity;Independently  OT Frequency: Min 2X/week    End of Session Equipment Utilized During Treatment: Gait belt;Rolling walker  Activity Tolerance: Patient tolerated treatment  well Patient left: in bed;with call bell/phone within reach;with bed alarm set;with SCD's reapplied   Time: 4098-1191 OT Time Calculation (min): 19 min Charges:  OT General Charges $OT Visit: 1 Procedure OT Evaluation $Initial OT Evaluation Tier I: 1 Procedure G-Codes:    Elizabeth [name] 06/03/2014, 9:41 AM   Elizabeth [name], OTR/L Occupational Therapist [phone] (pager)

## 2014-05-12 NOTE — Consult Note (Addendum)
WOC wound consult note Reason for Consult: Pt is familiar to [facility] team from previous admission, refer to progress notes on 1/27. VVS team following for assessment and plan of care to left stump. Consult requested for sacral wound, which has decreased in size since previous admission. Pt is very emaciated and frequently incontinent and immobile; multiple systemic factors can impair healing. 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 Wound bed: Beefy red, bone palpable Drainage (amount, consistency, odor) Mod amt yellow drainage, no odor Periwound: Intact skin surrounding Dressing procedure/placement/frequency: Pack wound with alginate to absorb drainage and promote healing, foam dressing to protect from moisture and shear. Discussed plan of care with patient and she verbalizes understanding. Please re-consult if further assistance is needed.  Thank-you,  Cammie Mcgee MSN, RN, CWOCN, [place], CNS [phone]

## 2014-05-12 NOTE — Telephone Encounter (Addendum)
-----   Message from Sharee Pimple, RN sent at 05/12/2014  9:26 AM EDT ----- Regarding: Schedule   ----- Message -----    From: Raymond Gurney, PA-C    Sent: 05/10/2014  12:19 PM      To: Vvs Charge Pool  S/p left BKA 05/10/14  F/u with Dr. Arbie Cookey in 2 weeks.  Thanks Kim  05/12/14: LM for pt at home #, dpm

## 2014-05-13 DIAGNOSIS — L089 Local infection of the skin and subcutaneous tissue, unspecified: Secondary | ICD-10-CM

## 2014-05-13 LAB — GLUCOSE, CAPILLARY
GLUCOSE-CAPILLARY: 134 mg/dL — AB (ref 70–99)
GLUCOSE-CAPILLARY: 136 mg/dL — AB (ref 70–99)
Glucose-Capillary: 110 mg/dL — ABNORMAL HIGH (ref 70–99)

## 2014-05-13 MED ORDER — DOXERCALCIFEROL 4 MCG/2ML IV SOLN: 1.0000 ug | INTRAVENOUS | Status: DC

## 2014-05-13 MED ORDER — LIDOCAINE-PRILOCAINE 2.5-2.5 % EX CREA
1.0000 "application " | TOPICAL_CREAM | CUTANEOUS | Status: DC | PRN
  Filled 2014-05-13: qty 5

## 2014-05-13 MED ORDER — NEPRO/CARBSTEADY PO LIQD
237.0000 mL | ORAL | Status: DC | PRN
  Filled 2014-05-13: qty 237

## 2014-05-13 MED ORDER — LIDOCAINE HCL (PF) 1 % IJ SOLN: 5.0000 mL | INTRAMUSCULAR | Status: DC | PRN

## 2014-05-13 MED ORDER — OXYCODONE-ACETAMINOPHEN 5-325 MG PO TABS: 1.0000 | ORAL_TABLET | Freq: Three times a day (TID) | ORAL | Status: DC | PRN

## 2014-05-13 MED ORDER — INSULIN GLARGINE 100 UNIT/ML ~~LOC~~ SOLN: 7.0000 [IU] | Freq: Every day | SUBCUTANEOUS | Status: DC

## 2014-05-13 MED ORDER — ACETAMINOPHEN-CODEINE #3 300-30 MG PO TABS: 1.0000 | ORAL_TABLET | Freq: Four times a day (QID) | ORAL | Status: DC | PRN

## 2014-05-13 MED ORDER — DARBEPOETIN ALFA 200 MCG/0.4ML IJ SOSY: 200.0000 ug | PREFILLED_SYRINGE | INTRAMUSCULAR | Status: DC

## 2014-05-13 MED ORDER — DOXERCALCIFEROL 4 MCG/2ML IV SOLN
INTRAVENOUS | Status: AC
  Filled 2014-05-13: qty 2

## 2014-05-13 MED ORDER — NEPRO/CARBSTEADY PO LIQD: 237.0000 mL | Freq: Every day | ORAL | Status: DC

## 2014-05-13 MED ORDER — PENTAFLUOROPROP-TETRAFLUOROETH EX AERO: 1.0000 "application " | INHALATION_SPRAY | CUTANEOUS | Status: DC | PRN

## 2014-05-13 MED ORDER — ALTEPLASE 2 MG IJ SOLR
2.0000 mg | Freq: Once | INTRAMUSCULAR | Status: DC | PRN
  Filled 2014-05-13: qty 2

## 2014-05-13 MED ORDER — HEPARIN SODIUM (PORCINE) 1000 UNIT/ML DIALYSIS: 1000.0000 [IU] | INTRAMUSCULAR | Status: DC | PRN

## 2014-05-13 MED ORDER — SODIUM CHLORIDE 0.9 % IV SOLN: 100.0000 mL | INTRAVENOUS | Status: DC | PRN

## 2014-05-13 MED ORDER — PANTOPRAZOLE SODIUM 40 MG PO TBEC: 40.0000 mg | DELAYED_RELEASE_TABLET | Freq: Every day | ORAL | Status: AC

## 2014-05-13 MED ORDER — HEPARIN SODIUM (PORCINE) 1000 UNIT/ML DIALYSIS: 100.0000 [IU]/kg | Freq: Once | INTRAMUSCULAR | Status: DC

## 2014-05-13 NOTE — Clinical Social Work Note (Signed)
Ms. Linebaugh discharged to [facility], instead of returning to [facility], where patient was receiving rehab services prior to hospitalization. Patient was unhappy with the care received by someone on staff at [facility], and was will to discharge to the sister facility. Granddaughter Valorie Roosevelt [phone]) was contacted and informed of today's discharge.  Patient will be transported to [facility] by ambulance.   Genelle Bal, MSW, LCSW Licensed Clinical Social Worker Clinical Social Work Department [employer] [phone]

## 2014-05-13 NOTE — Discharge Summary (Addendum)
Physician Discharge Summary  Elizabeth [name] ZOX:[id number] DOB: [birth year] DOA: 05/09/2014  PCP: [name], MD  Admit date: 05/09/2014 Discharge date: 05/13/2014  Time spent: 35 minutes  Recommendations for Outpatient Follow-up:  1. Cont HD 2. Monitor blood sugars  Discharge Diagnoses:  Active Problems:   Anemia of chronic disease   End stage renal disease-on HD   DM type 2 (diabetes mellitus, type 2)   Osteomyelitis   Discharge Condition: improved  Diet recommendation: renal/diabetic  Filed Weights   05/11/14 2055 05/12/14 2018 05/13/14 0810  Weight: 33.5 kg (73 lb 13.7 oz) 38.329 kg (84 lb 8 oz) 34.5 kg (76 lb 0.9 oz)    History of present illness:  Elizabeth [name] is a 69 y.o. female   hx of HL, ESRD on HD (last dialysis was yesterday), here presenting with left foot infection. She had a left second toe gangrene in February and had an amputation by Dr. Darrick Penna. She is currently at rehabilitation Center. She has been followed with wound care and her doctor saw her wound today and felt that it was infected and was sent here for evaluation. Denies any fevers or chills, no chest pain, no sob. She reported still make some urine, does have a indwelling Foley for neurogenic bladder/urinary retention that is chronic. Last dialysis was yesterday.  ED course, left foot x ray showed Acute osteomyelitis involving in the residual 2nd metatarsal, 3rd metatarsal head, and 3rd proximal phalanx.Associated pathologic fracture of the 3rd proximal phalanx. she is started on vanc/zosyn, vascular surgery called, they will see patient tonight, hospitalist called for admission.  Hospital Course:  Osteomyelitis left foot: --s/p amputation --d/c abx -PT-snf  ESRD on hemodialysis. Nephrology consulted for continuation of hemodialysis.  Insulin dependent Diabetes mellitus. a1c pending, continue insulin.  Essential hypertension. Continue carvedilol.   Chronic foley  Stage 4  decub  Procedures:  ampuation  Consultations:    Discharge Exam: Filed Vitals:   05/13/14 1130  BP: 83/46  Pulse: 97  Temp:   Resp:     General:awake in dialysis Cardiovascular: rrr Respiratory: clear  Discharge Instructions   Discharge Instructions    Discharge instructions    Complete by:  As directed   Renal diet/carb mod Cbc, bmp 1 week     Increase activity slowly    Complete by:  As directed           Current Discharge Medication List    START taking these medications   Details  Darbepoetin Alfa (ARANESP) 200 MCG/0.4ML SOSY injection Inject 0.4 mLs (200 mcg total) into the vein every Thursday with hemodialysis. Qty: 1.68 mL    doxercalciferol (HECTOROL) 4 MCG/2ML injection Inject 0.5 mLs (1 mcg total) into the vein Every Tuesday,Thursday,and Saturday with dialysis. Qty: 2 mL    pantoprazole (PROTONIX) 40 MG tablet Take 1 tablet (40 mg total) by mouth daily.      CONTINUE these medications which have CHANGED   Details  acetaminophen-codeine (TYLENOL #3) 300-30 MG per tablet Take 1 tablet by mouth every 6 (six) hours as needed for moderate pain. Qty: 30 tablet, Refills: 0    insulin glargine (LANTUS) 100 UNIT/ML injection Inject 0.07 mLs (7 Units total) into the skin daily at 10 pm. Qty: 10 mL, Refills: 11    Nutritional Supplements (FEEDING SUPPLEMENT, NEPRO CARB STEADY,) LIQD Take 237 mLs by mouth daily at 2 PM daily at 2 PM. Refills: 0    oxyCODONE-acetaminophen (PERCOCET/ROXICET) 5-325 MG per tablet Take 1 tablet by mouth every 8 (eight)  hours as needed for moderate pain or severe pain. Qty: 10 tablet, Refills: 0      CONTINUE these medications which have NOT CHANGED   Details  Amino Acids-Protein Hydrolys (FEEDING SUPPLEMENT, PRO-STAT SUGAR FREE 64,) LIQD Take 30 mLs by mouth 3 (three) times daily with meals.    aspirin EC 81 MG tablet Take 81 mg by mouth daily.    carvedilol (COREG) 6.25 MG tablet take 1 tablet by mouth twice a day Qty:  60 tablet, Refills: 11    docusate sodium (COLACE) 100 MG capsule Take 1 capsule (100 mg total) by mouth daily. Qty: 10 capsule, Refills: 0    glucose blood (ONETOUCH VERIO) test strip Use as instructed to check blood sugar 4 times per day dx code E13.10 Qty: 150 each, Refills: 3    insulin aspart (NOVOLOG) 100 UNIT/ML injection Inject 0-15 Units into the skin 3 (three) times daily with meals. Qty: 10 mL, Refills: 11    loratadine (CLARITIN) 10 MG tablet Take 10 mg by mouth daily as needed for allergies.    Multiple Vitamins-Minerals (DECUBI-VITE) CAPS Take 2 capsules by mouth daily.    ONETOUCH DELICA LANCETS FINE MISC Use to check blood sugar 4 times per day Qty: 200 each, Refills: 3    pravastatin (PRAVACHOL) 20 MG tablet Take 20 mg by mouth daily.    saccharomyces boulardii (FLORASTOR) 250 MG capsule Take 1 capsule (250 mg total) by mouth 2 (two) times daily.    sevelamer carbonate (RENVELA) 800 MG tablet Take 1 tablet (800 mg total) by mouth 3 (three) times daily with meals.      STOP taking these medications     furosemide (LASIX) 40 MG tablet      promethazine (PHENERGAN) 12.5 MG tablet        No Known Allergies Follow-up Information    Follow up with EARLY, TODD, MD In 4 weeks.   Specialty:  Vascular Surgery   Why:  Our office will call you to arrange an appointment (sent)   Contact information:   [address] [place] [address] [phone]       Follow up with [facility], MD In 1 week.   Specialty:  Endocrinology   Contact information:   [address] [place] [address] [phone]        The results of significant diagnostics from this hospitalization (including imaging, microbiology, ancillary and laboratory) are listed below for reference.    Significant Diagnostic Studies: Dg Chest 2 View  05/09/2014   CLINICAL DATA:  Cough.  EXAM: CHEST - 2 VIEW  COMPARISON:  Two-view chest 04/28/2014.  FINDINGS: The heart is mildly enlarged.  A right IJ dialysis catheter is in place. Aeration is improved. Effusions are near completely resolved. There is no significant edema.  IMPRESSION: 1. Stable mild cardiomegaly. 2. Decreased pleural effusions and improved aeration.   Electronically Signed   By: Marin Roberts M.D.   On: 05/09/2014 17:18   Dg Chest 2 View  04/28/2014   CLINICAL DATA:  Preop AV fistula creation. Coronary artery disease, hypertension, diabetes, end-stage renal disease.  EXAM: CHEST  2 VIEW  COMPARISON:  12/24/2013  FINDINGS: Right dialysis catheter remains in place, unchanged. Bilateral perihilar and infrahilar airspace opacities could reflect mild edema. Heart is borderline in size. Small bilateral effusions noted. No acute bony abnormality.  IMPRESSION: Small bilateral effusions. Bilateral perihilar and lower lobe opacities could reflect edema.   Electronically Signed   By: Charlett Nose  M.D.   On: 04/28/2014 08:36   Dg Foot Complete Left  05/09/2014   CLINICAL DATA:  Pain/lacerations on left foot  EXAM: LEFT FOOT - COMPLETE 3+ VIEW  COMPARISON:  MRI left foot dated 03/17/2014  FINDINGS: Status post mid tarsal amputation of the 2nd digit. Bony sequestrum involving the residual 2nd metatarsal, indicating residual osteomyelitis. Associated soft tissue swelling with subcutaneous gas.  Additional cortical irregularity involving the medial aspect of the 3rd metatarsal head, indicating acute osteomyelitis.  Additional cortical irregularity involving the base/proximal shaft of the 3rd proximal phalanx. Associated pathologic fracture.  IMPRESSION: Acute osteomyelitis involving in the residual 2nd metatarsal, 3rd metatarsal head, and 3rd proximal phalanx.  Associated pathologic fracture of the 3rd proximal phalanx.   Electronically Signed   By: Charline Bills M.D.   On: 05/09/2014 17:24    Microbiology: Recent Results (from the past 240 hour(s))  Blood culture (routine x 2)     Status: None (Preliminary result)   Collection  Time: 05/09/14  4:40 PM  Result Value Ref Range Status   Specimen Description BLOOD RIGHT FOREARM  Final   Special Requests BOTTLES DRAWN AEROBIC AND ANAEROBIC 5CC  Final   Culture   Final           BLOOD CULTURE RECEIVED NO GROWTH TO DATE CULTURE WILL BE HELD FOR 5 DAYS BEFORE ISSUING A FINAL NEGATIVE REPORT Performed at [facility]    Report Status PENDING  Incomplete  Blood culture (routine x 2)     Status: None (Preliminary result)   Collection Time: 05/09/14  5:20 PM  Result Value Ref Range Status   Specimen Description BLOOD ARM RIGHT  Final   Special Requests BOTTLES DRAWN AEROBIC AND ANAEROBIC 5CC  Final   Culture   Final           BLOOD CULTURE RECEIVED NO GROWTH TO DATE CULTURE WILL BE HELD FOR 5 DAYS BEFORE ISSUING A FINAL NEGATIVE REPORT Performed at [facility]    Report Status PENDING  Incomplete  Surgical pcr screen     Status: None   Collection Time: 05/09/14 10:14 PM  Result Value Ref Range Status   MRSA, PCR NEGATIVE NEGATIVE Final   Staphylococcus aureus NEGATIVE NEGATIVE Final    Comment:        The Xpert SA Assay (FDA approved for NASAL specimens in patients over 39 years of age), is one component of a comprehensive surveillance program.  Test performance has been validated by [facility] for patients greater than or equal to 85 year old. It is not intended to diagnose infection nor to guide or monitor treatment.      Labs: Basic Metabolic Panel:  Recent Labs Lab 05/09/14 1640 05/10/14 0500 05/11/14 0811 05/12/14 1027  NA 135 136 136 135  K 3.7 3.5 4.5 5.0  CL 93* 95* 98 97  CO2 30 31 29 29   GLUCOSE 299* 45* 114* 151*  BUN 43* 48* 24* 52*  CREATININE 3.49* 3.92* 3.12* 4.52*  CALCIUM 8.4 8.0* 8.2* 8.1*   Liver Function Tests:  Recent Labs Lab 05/09/14 1640  AST 14  ALT 11  ALKPHOS 152*  BILITOT 0.4  PROT 7.0  ALBUMIN 2.1*   No results for input(s): LIPASE, AMYLASE in the last 168 hours. No results for  input(s): AMMONIA in the last 168 hours. CBC:  Recent Labs Lab 05/09/14 1640 05/10/14 0500 05/11/14 0811 05/12/14 1027  WBC 8.5 7.7 9.7 11.9*  NEUTROABS 6.7  --   --   --  HGB 11.4* 10.8* 10.8* 10.4*  HCT 37.1 35.5* 37.1 33.8*  MCV 87.1 86.6 90.3 87.3  PLT 252 271 213 225   Cardiac Enzymes: No results for input(s): CKTOTAL, CKMB, CKMBINDEX, TROPONINI in the last 168 hours. BNP: BNP (last 3 results) No results for input(s): BNP in the last 8760 hours.  ProBNP (last 3 results) No results for input(s): PROBNP in the last 8760 hours.  CBG:  Recent Labs Lab 05/12/14 0727 05/12/14 1057 05/12/14 1634 05/12/14 2113 05/13/14 0727  GLUCAP 136* 168* 158* 162* 134*       Signed:  Irvin Lizama  [facility] 05/13/2014, 11:48 AM

## 2014-05-13 NOTE — Progress Notes (Signed)
Pt back from dialysis nauseated,  Vomiting small amt.  Zofran 4 mg IV given.

## 2014-05-13 NOTE — Progress Notes (Signed)
[facility].Belongings with patient. Zanita Millman, Drinda Butts, Charity fundraiser

## 2014-05-13 NOTE — Clinical Documentation Improvement (Signed)
On 05/12/2014, patient had WOC consult for sacral wound. Please document the diagnosis of the pressure ulcer and specify POA status.  . Site (include laterality): --Sacral  . Pressure Ulcer Stage: ---Stage 4 --Unspecified  . Document if ulcer (including stage) is present on admission  Supporting Information:WOC wound consult note: "very emaciated and frequently incontinent and immobile; multiple systemic factors can impair healing. 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

## 2014-05-13 NOTE — Progress Notes (Signed)
Report given to [facility], [place].

## 2014-05-13 NOTE — Progress Notes (Signed)
PT Cancellation Note  Patient Details Name: Elizabeth [name] MRN: [id number] DOB: [birth year]   Cancelled Treatment:    Reason Eval/Treat Not Completed: Patient at procedure or test/unavailable. Pt going to HD this morning. Will re-attempt to see at next available time.    Donnamarie Poag [place],   [phone] 05/13/2014, 8:06 AM

## 2014-05-16 ENCOUNTER — Encounter (HOSPITAL_BASED_OUTPATIENT_CLINIC_OR_DEPARTMENT_OTHER): Payer: Medicare Other

## 2014-05-16 LAB — CULTURE, BLOOD (ROUTINE X 2)
CULTURE: NO GROWTH
CULTURE: NO GROWTH

## 2014-05-16 NOTE — Progress Notes (Signed)
Patient ID: Elizabeth [name], female   DOB: 27-Jan-1946, 69 y.o.   MRN: [id number]  [facility]     No Known Allergies     Chief Complaint  Patient presents with  . Acute Visit    diabetes     HPI:  Her cbg's have been elevated. She is presently taking lantus 5 units daily with novolog 5 units prior to meals for cbg >=150. She is not voicing any complaints today.    Past Medical History  Diagnosis Date  . Hyperlipidemia   . Urinary retention     DX NEUROGENIC BLADDER--HAS INDWELLING FOLEY CATHETER  . Arthritis   . Anemia   . CAD (coronary artery disease)   . Neuropathic pain   . Stroke     patient not aware.  . Foley catheter in place     for urinary retention  . Critical lower limb ischemia   . Hypertension   . Peripheral vascular disease   . Hypoglycemia 08/19/2013  . Hypokalemia 08/19/2013  . Diabetes mellitus     TAKES INSULIN   . ESRD (end stage renal disease)     [address], TH, S    Past Surgical History  Procedure Laterality Date  . Insertion of suprapubic catheter N/A 05/16/2012    Procedure: INSERTION OF SUPRAPUBIC CATHETER;  Surgeon: Sebastian Ache, MD;  Location: [facility] ORS;  Service: Urology;  Laterality: N/A;  . Bascilic vein transposition Left 10/30/2012    Procedure: LEFT 1ST STAGE BASCILIC VEIN TRANSPOSITION;  Surgeon: Chuck Hint, MD;  Location: [facility] OR;  Service: Vascular;  Laterality: Left;  . Nm myocar perf wall motion  04/19/2012    low risk study-EF 44%,mild inferolateral hypkinesis  . Bascilic vein transposition Left 02/05/2013    Procedure: BASCILIC VEIN TRANSPOSITION 2ND STAGE;  Surgeon: Chuck Hint, MD;  Location: [facility] OR;  Service: Vascular;  Laterality: Left;  . Radiology with anesthesia N/A 10/25/2013    Procedure: RADIOLOGY WITH ANESTHESIA;  Surgeon: Medication Radiologist, MD;  Location: [facility] OR;  Service: Radiology;  Laterality: N/A;  . Fistulogram Left 04/29/2013    Procedure: FISTULOGRAM;  Surgeon: Chuck Hint,  MD;  Location: [facility] CATH LAB;  Service: Cardiovascular;  Laterality: Left;  . Angioplasty Left 04/29/2013    Procedure: ANGIOPLASTY;  Surgeon: Chuck Hint, MD;  Location: [facility] CATH LAB;  Service: Cardiovascular;  Laterality: Left;  . Abdominal aortagram N/A 03/19/2014    Procedure: ABDOMINAL Ronny Flurry;  Surgeon: Sherren Kerns, MD;  Location: [facility] CATH LAB;  Service: Cardiovascular;  Laterality: N/A;  . Left and right heart catheterization with coronary angiogram N/A 03/24/2014    Procedure: LEFT AND RIGHT HEART CATHETERIZATION WITH CORONARY ANGIOGRAM;  Surgeon: Marykay Lex, MD;  Location: [facility] CATH LAB;  Service: Cardiovascular;  Laterality: N/A;  . Endarterectomy popliteal Left 03/26/2014    Procedure: Left popliteal and peroneal artery endarterectomy with vein patch angioplasty;  Surgeon: Sherren Kerns, MD;  Location: [facility] OR;  Service: Vascular;  Laterality: Left;  . Amputation Left 03/26/2014    Procedure: AMPUTATION SECOND LEFT TOE;  Surgeon: Sherren Kerns, MD;  Location: [facility] OR;  Service: Vascular;  Laterality: Left;  . Av fistula placement Left 04/28/2014    Procedure: CREATION OF LEFT BRACHIOCEPHALIC  ARTERIOVENOUS (AV) FISTULA CREATION;  Surgeon: Fransisco Hertz, MD;  Location: [facility] OR;  Service: Vascular;  Laterality: Left;  . Amputation Left 05/10/2014    Procedure: AMPUTATION BELOW KNEE;  Surgeon: Larina Earthly, MD;  Location: [facility]  OR;  Service: Vascular;  Laterality: Left;    VITAL SIGNS BP 118/70 mmHg  Pulse 110  Ht 5\' 3"  (1.6 m)  Wt 84 lb (38.102 kg)  BMI 14.88 kg/m2   Outpatient Encounter Prescriptions as of 04/02/2014  Medication Sig  Asa 81 mg daily cireg 6.25 mg twice daily  Cyproheptadine 4 mg daily  Colace 100 mg daily  Doxycycline 0 mg twice daily through 04-03-14 lantus 5 units daily  novolog 5 units with meals for cbg >=150 Percocet 5/325 mg every 8 hours as needed pravachol 20 mg daily  renvela 800 mg three times daily    SIGNIFICANT DIAGNOSTIC EXAMS  LABS  REVIEWED:   04-01-14: wbc 12.1; hgb 9.6; hct 34.7; mcv 92.4; plt 434    Review of Systems  Constitutional: Negative for malaise/fatigue.  Respiratory: Negative for cough and shortness of breath.   Cardiovascular: Negative for chest pain.  Gastrointestinal: Negative for heartburn and abdominal pain.  Musculoskeletal: Negative for myalgias and joint pain.  Skin:       Has surgical area   Psychiatric/Behavioral: The patient is not nervous/anxious.      Physical Exam  Constitutional: No distress.  Frail   Neck: Neck supple. No JVD present.  Cardiovascular: Normal rate, regular rhythm and intact distal pulses.   Murmur heard. Respiratory: Effort normal and breath sounds normal. No respiratory distress.  GI: Soft. Bowel sounds are normal. She exhibits no distension. There is no tenderness.  Musculoskeletal: She exhibits no edema.  Is able to move extremities   Neurological: She is alert.  Skin: Skin is warm and dry. She is not diaphoretic.  Left arm A/V fistula: +thril +bruit No drainage from surgical area        ASSESSMENT/ PLAN:  1. Diabetes: will increase her lantus to 7 units daily will continue to monitor her status.   Synthia Innocent NP [facility]  Contact [phone] Monday through Friday 8am- 5pm  After hours call [phone]

## 2014-05-20 ENCOUNTER — Non-Acute Institutional Stay (SKILLED_NURSING_FACILITY): Payer: Medicare Other | Admitting: Adult Health

## 2014-05-20 DIAGNOSIS — I998 Other disorder of circulatory system: Secondary | ICD-10-CM | POA: Diagnosis not present

## 2014-05-20 DIAGNOSIS — I5022 Chronic systolic (congestive) heart failure: Secondary | ICD-10-CM | POA: Diagnosis not present

## 2014-05-20 DIAGNOSIS — D638 Anemia in other chronic diseases classified elsewhere: Secondary | ICD-10-CM | POA: Diagnosis not present

## 2014-05-20 DIAGNOSIS — N186 End stage renal disease: Secondary | ICD-10-CM | POA: Diagnosis not present

## 2014-05-20 DIAGNOSIS — Z89512 Acquired absence of left leg below knee: Secondary | ICD-10-CM

## 2014-05-20 DIAGNOSIS — N058 Unspecified nephritic syndrome with other morphologic changes: Secondary | ICD-10-CM

## 2014-05-20 DIAGNOSIS — I70229 Atherosclerosis of native arteries of extremities with rest pain, unspecified extremity: Secondary | ICD-10-CM

## 2014-05-20 DIAGNOSIS — E1129 Type 2 diabetes mellitus with other diabetic kidney complication: Secondary | ICD-10-CM

## 2014-05-21 ENCOUNTER — Ambulatory Visit: Payer: Medicare Other | Admitting: Vascular Surgery

## 2014-05-23 DIAGNOSIS — K920 Hematemesis: Secondary | ICD-10-CM

## 2014-05-23 HISTORY — DX: Hematemesis: K92.0

## 2014-05-26 ENCOUNTER — Encounter: Payer: Self-pay | Admitting: Vascular Surgery

## 2014-05-27 ENCOUNTER — Encounter: Payer: Medicare Other | Admitting: Vascular Surgery

## 2014-05-28 ENCOUNTER — Non-Acute Institutional Stay (SKILLED_NURSING_FACILITY): Payer: Medicare Other | Admitting: Internal Medicine

## 2014-05-28 ENCOUNTER — Encounter: Payer: Self-pay | Admitting: Internal Medicine

## 2014-05-28 DIAGNOSIS — Z89512 Acquired absence of left leg below knee: Secondary | ICD-10-CM

## 2014-05-28 DIAGNOSIS — I519 Heart disease, unspecified: Secondary | ICD-10-CM

## 2014-05-28 DIAGNOSIS — I5022 Chronic systolic (congestive) heart failure: Secondary | ICD-10-CM

## 2014-05-28 DIAGNOSIS — N189 Chronic kidney disease, unspecified: Secondary | ICD-10-CM

## 2014-05-28 DIAGNOSIS — N186 End stage renal disease: Secondary | ICD-10-CM | POA: Diagnosis not present

## 2014-05-28 DIAGNOSIS — I10 Essential (primary) hypertension: Secondary | ICD-10-CM

## 2014-05-28 DIAGNOSIS — E1122 Type 2 diabetes mellitus with diabetic chronic kidney disease: Secondary | ICD-10-CM | POA: Diagnosis not present

## 2014-05-28 DIAGNOSIS — E43 Unspecified severe protein-calorie malnutrition: Secondary | ICD-10-CM | POA: Diagnosis not present

## 2014-05-28 DIAGNOSIS — L89154 Pressure ulcer of sacral region, stage 4: Secondary | ICD-10-CM | POA: Diagnosis not present

## 2014-05-28 DIAGNOSIS — Z89519 Acquired absence of unspecified leg below knee: Secondary | ICD-10-CM | POA: Insufficient documentation

## 2014-05-28 DIAGNOSIS — L98429 Non-pressure chronic ulcer of back with unspecified severity: Secondary | ICD-10-CM

## 2014-05-28 NOTE — Progress Notes (Signed)
Patient ID: Elizabeth [name], female   DOB: 10-12-1945, 69 y.o.   MRN: [id number]    HISTORY AND PHYSICAL  05/27/14  Location:  [facility]    Place of Service: SNF [phone])   Extended Emergency Contact Information Primary Emergency Contact: Gunnar Bulla, [place] of [place] Mobile Phone: [phone] Relation: Grandaughter Secondary Emergency Contact: Donora of [place] Mobile Phone: [phone] Relation: Niece  Advanced Directive information  FULL CODE  Chief Complaint  Patient presents with  . 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 She has ESRD/HD but did not go today due to N/V/D over this past weekend. She had tuna with mayo prior to illness onset. Today, she c/o nausea and min loose stools. No f/c. No bloody stools. She has very little pain at surgical site. She has not begun PT/OT yet. No other concerns.   She continues to have a chronic foley cath due to urinary retention from neurogenic bladder.  She has chronic anemia due to CKD.  She takes insulin tx for diabetes. No low BS reactions. She has peripheral neuropathy  No myalgias on pravastatin for hyperlipidemia  BP stable on coreg  She takes ASA daily for hx stroke, CAD and PVD  GERD stable on protonix  Past Medical History  Diagnosis Date  . Coronary artery disease, non-occlusive January 2016    Moderate p&mLAD & Ostial OM; Myoview Nuclear Stress Test 03/21/2014: EF 35%. Mild hypokinesis of the inferior wall with fixed inferior defect suggesting scar. (no culprit lesion on CATH)  . 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 Critical lower limb ischemia     Status post left BKA following  left popliteal and peroneal artery endarterectomy with patch angioplasty.  . Diabetes mellitus, insulin dependent (IDDM), uncontrolled     Brittle; has h/o Hypo & Hyperglycemic episodes  . Diabetic neuropathy   . Essential hypertension   . Hyperlipidemia with target LDL less than 70   . History of CVA (cerebrovascular accident) 08/19/2013    "minor one"  . ESRD (end stage renal disease)     [address], [place], [place] (06/12/2014)  . Urinary retention     DX NEUROGENIC BLADDER--HAS INDWELLING FOLEY CATHETER  . Foley catheter in place     for urinary retention  . Anemia   . Hematemesis April 2016    Hospitalized  . Arthritis of knee, right     "right knee" (06/12/2014)  . Gout     "was in my LLE"    Past Surgical History  Procedure Laterality Date  . Insertion of suprapubic catheter N/A 05/16/2012    Procedure: INSERTION OF SUPRAPUBIC CATHETER;  Surgeon: Alexis Frock, MD;  Location: [facility];  Service: Urology;  Laterality: N/A;  . Bascilic vein transposition Left 10/30/2012    Procedure: LEFT 1ST STAGE BASCILIC VEIN TRANSPOSITION;  Surgeon: Angelia Mould, MD;  Location: [place];  Service: Vascular;  Laterality: Left;  . Nm myocar perf wall motion  04/19/2012/ 02/2014    a) low risk study-EF 44%,mild inferolateral hypkinesis; b) EF 35%. Mild hypokinesis of the inferior wall with fixed inferior defect suggesting scar.  . Bascilic vein transposition  Left 02/05/2013    Procedure: BASCILIC VEIN TRANSPOSITION 2ND STAGE;  Surgeon: Angelia Mould, MD;  Location: [place];  Service: Vascular;  Laterality: Left;  . Radiology with anesthesia N/A 10/25/2013    Procedure: RADIOLOGY WITH ANESTHESIA;  Surgeon: Medication Radiologist, MD;  Location: [place];  Service: Radiology;  Laterality: N/A;  . Fistulogram Left 04/29/2013    Procedure: FISTULOGRAM;  Surgeon: Angelia Mould, MD;  Location: [facility] CATH LAB;  Service: Cardiovascular;  Laterality: Left;  . Angioplasty Left 04/29/2013    Procedure:  ANGIOPLASTY;  Surgeon: Angelia Mould, MD;  Location: [facility] CATH LAB;  Service: Cardiovascular;  Laterality: Left;  . Abdominal aortagram N/A 03/19/2014    Procedure: ABDOMINAL Maxcine Ham;  Surgeon: Elam Dutch, MD;  Location: [facility] CATH LAB;  Service: Cardiovascular;  Laterality: N/A;  . Left and right heart catheterization with coronary angiogram N/A 03/24/2014    Procedure: LEFT AND RIGHT HEART CATHETERIZATION WITH CORONARY ANGIOGRAM;  Surgeon: Leonie Man, MD;  Location: [facility] CATH LAB;  Service: Cardiovascular; Moderate disease in p-mLAD & Ostial OM1; normal right heart pressures. Wedge pressure 29 mmHg. --No lesion to explain inferior defect on Myoview and echocardiogram.   . Endarterectomy popliteal Left 03/26/2014    Procedure: Left popliteal and peroneal artery endarterectomy with vein patch angioplasty;  Surgeon: Elam Dutch, MD;  Location: [place];  Service: Vascular;  Laterality: Left;  . Amputation Left 03/26/2014    Procedure: AMPUTATION SECOND LEFT TOE;  Surgeon: Elam Dutch, MD;  Location: [place];  Service: Vascular;  Laterality: Left;  . Av fistula placement Left 04/28/2014    Procedure: CREATION OF LEFT BRACHIOCEPHALIC  ARTERIOVENOUS (AV) FISTULA CREATION;  Surgeon: Conrad Swarthmore, MD;  Location: [place];  Service: Vascular;  Laterality: Left;  . Amputation Left 05/10/2014    Procedure: AMPUTATION BELOW KNEE;  Surgeon: Rosetta Posner, MD;  Location: [facility] OR;  Service: Vascular;  Laterality: Left;  . Esophagogastroduodenoscopy N/A 06/13/2014    Procedure: ESOPHAGOGASTRODUODENOSCOPY (EGD);  Surgeon: Carol Ada, MD;  Location: [facility] ENDOSCOPY;  Service: Endoscopy;  Laterality: N/A;  . Transthoracic echocardiogram  03/22/2014    EF 30-35%, mild LVH. Severe inferior hypokinesis. Mild MR.    Patient Care Team: Elayne Snare, MD as PCP - General (Endocrinology) Fleet Contras, MD as Consulting Physician (Nephrology)  History   Social History  . Marital Status: Single    Spouse Name:  N/A  . Number of Children: N/A  . Years of Education: N/A   Occupational History  . Not on file.   Social History Main Topics  . Smoking status: Never Smoker   . Smokeless tobacco: Never Used  . Alcohol Use: 0.0 oz/week    0 Standard drinks or equivalent per week     Comment: "stopped drinking in 2013 drank a couple beers/day, 2 days/wk"  . Drug Use: No  . Sexual Activity: No   Other Topics Concern  . Not on file   Social History Narrative     reports that she has never smoked. She has never used smokeless tobacco. She reports that she drinks alcohol. She reports that she does not use illicit drugs.  Family History  Problem Relation Age of Onset  . Hyperlipidemia Mother   . Hypertension Mother   . Hodgkin's lymphoma Father   . Heart disease Sister    Family Status  Relation Status Death Age  . Mother Deceased 37  . Father Deceased 48  . Sister Deceased   .  Child Alive   . Child Alive     Immunization History  Administered Date(s) Administered  . Influenza-Unspecified 11/21/2012, 12/22/2013  . Pneumococcal Polysaccharide-23 10/21/2013    No Known Allergies  Medications: Patient's Medications  New Prescriptions   INSULIN ASPART (NOVOLOG) 100 UNIT/ML INJECTION    Inject 0-9 Units into the skin 3 (three) times daily with meals. Sliding scale CBG 70 - 120: 0 units CBG 121 - 150: 1 unit,  CBG 151 - 200: 2 units,  CBG 201 - 250: 3 units,  CBG 251 - 300: 5 units,  CBG 301 - 350: 7 units,  CBG 351 - 400: 9 units   CBG > 400: 9 units and notify your MD  Previous Medications   AMINO ACIDS-PROTEIN HYDROLYS (FEEDING SUPPLEMENT, PRO-STAT SUGAR FREE 64,) LIQD    Take 30 mLs by mouth 3 (three) times daily with meals.   ASPIRIN 81 MG TABLET    Take 81 mg by mouth daily.   CETIRIZINE (ZYRTEC) 10 MG TABLET    Take 10 mg by mouth as needed for allergies.   DARBEPOETIN ALFA (ARANESP) 200 MCG/0.4ML SOSY INJECTION    Inject 0.4 mLs (200 mcg total) into the vein every Thursday with  hemodialysis.   DOXERCALCIFEROL (HECTOROL) 4 MCG/2ML INJECTION    Inject 0.5 mLs (1 mcg total) into the vein Every Tuesday,Thursday,and Saturday with dialysis.   GLUCOSE BLOOD (ONETOUCH VERIO) TEST STRIP    Use as instructed to check blood sugar 4 times per day dx code E13.10   LIDOCAINE-PRILOCAINE (EMLA) CREAM    Apply topically once.   MULTIPLE VITAMINS-MINERALS (DECUBI-VITE) CAPS    Take 2 capsules by mouth daily.   ONETOUCH DELICA LANCETS FINE MISC    Use to check blood sugar 4 times per day   PANTOPRAZOLE (PROTONIX) 40 MG TABLET    Take 1 tablet (40 mg total) by mouth daily.   PRAVASTATIN (PRAVACHOL) 20 MG TABLET    Take 20 mg by mouth daily.   SACCHAROMYCES BOULARDII (FLORASTOR) 250 MG CAPSULE    Take 1 capsule (250 mg total) by mouth 2 (two) times daily.   SEVELAMER CARBONATE (RENVELA) 800 MG TABLET    Take 1 tablet (800 mg total) by mouth 3 (three) times daily with meals.  Modified Medications   Modified Medication Previous Medication   DOCUSATE SODIUM (COLACE) 50 MG CAPSULE docusate sodium (COLACE) 100 MG capsule      Take 1 capsule (50 mg total) by mouth 2 (two) times daily. Hold for diarrhea    Take 1 capsule (100 mg total) by mouth daily.   INSULIN GLARGINE (LANTUS) 100 UNIT/ML INJECTION insulin glargine (LANTUS) 100 UNIT/ML injection      Inject 0.03 mLs (3 Units total) into the skin daily.    Inject 0.07 mLs (7 Units total) into the skin daily at 10 pm.   NUTRITIONAL SUPPLEMENTS (FEEDING SUPPLEMENT, NEPRO CARB STEADY,) LIQD Nutritional Supplements (FEEDING SUPPLEMENT, NEPRO CARB STEADY,) LIQD      Take 237 mLs by mouth 2 (two) times daily between meals.    Take 237 mLs by mouth daily at 2 PM daily at 2 PM.   OXYCODONE-ACETAMINOPHEN (PERCOCET/ROXICET) 5-325 MG PER TABLET oxyCODONE-acetaminophen (PERCOCET/ROXICET) 5-325 MG per tablet      Take 1 tablet by mouth every 8 (eight) hours as needed for moderate pain or severe pain.    Take 1 tablet by mouth every 8 (eight) hours as needed  for moderate pain or severe pain.  Discontinued Medications  ACETAMINOPHEN-CODEINE (TYLENOL #3) 300-30 MG PER TABLET    Take 1 tablet by mouth every 6 (six) hours as needed for moderate pain.   ASPIRIN EC 81 MG TABLET    Take 81 mg by mouth daily.   CARVEDILOL (COREG) 6.25 MG TABLET    take 1 tablet by mouth twice a day   INSULIN ASPART (NOVOLOG) 100 UNIT/ML INJECTION    Inject 0-15 Units into the skin 3 (three) times daily with meals.   LORATADINE (CLARITIN) 10 MG TABLET    Take 10 mg by mouth daily as needed for allergies.   OXYCODONE-ACETAMINOPHEN (PERCOCET/ROXICET) 5-325 MG PER TABLET    Take 1 tablet by mouth every 8 (eight) hours as needed for moderate pain or severe pain.    Review of Systems  Constitutional: Negative for fever, chills, diaphoresis, activity change, appetite change and fatigue.  HENT: Negative for ear pain and sore throat.   Eyes: Negative for visual disturbance.  Respiratory: Negative for cough, chest tightness and shortness of breath.   Cardiovascular: Negative for chest pain, palpitations and leg swelling.  Gastrointestinal: Positive for nausea. Negative for vomiting, abdominal pain, diarrhea, constipation and blood in stool.  Genitourinary: Negative for dysuria.  Musculoskeletal: Positive for arthralgias and gait problem.  Neurological: Negative for dizziness, tremors, numbness and headaches.  Psychiatric/Behavioral: Negative for sleep disturbance. The patient is not nervous/anxious.     Filed Vitals:   05/27/14 2220  BP: 120/74  Pulse: 108  Temp: 97.6 F (36.4 C)  Weight: 79 lb (35.834 kg)  SpO2: 95%   Body mass index is 14 kg/(m^2).  Physical Exam  Constitutional: She is oriented to person, place, and time. She appears well-developed and well-nourished.  HENT:  Mouth/Throat: Oropharynx is clear and moist. No oropharyngeal exudate.  Eyes: Pupils are equal, round, and reactive to light. No scleral icterus.  Neck: Neck supple. Carotid bruit is not  present. No tracheal deviation present. No thyromegaly present.  Cardiovascular: Normal rate, regular rhythm, normal heart sounds and intact distal pulses.  Exam reveals no gallop and no friction rub.   No murmur heard. No RLE edema. Left arm AVF with palpable thrill and audible bruit.   Pulmonary/Chest: Effort normal and breath sounds normal. No stridor. No respiratory distress. She has no wheezes. She has no rales.  Abdominal: Soft. Bowel sounds are normal. She exhibits no distension and no mass. There is no tenderness. There is no rebound and no guarding.  Genitourinary:  Foley cath intact and DTG dark yellow urine  Musculoskeletal:  Left BKA dressing c/d/i  Lymphadenopathy:    She has no cervical adenopathy.  Neurological: She is alert and oriented to person, place, and time.  Skin: Skin is warm and dry. No rash noted.  Reported stage 4 sacral ulcer  Psychiatric: She has a normal mood and affect. Her behavior is normal. Thought content normal.     Labs reviewed: Admission on 05/09/2014, Discharged on 05/13/2014  Component Date Value Ref Range Status  . WBC 05/09/2014 8.5  4.0 - 10.5 K/uL Final  . RBC 05/09/2014 4.26  3.87 - 5.11 MIL/uL Final  . Hemoglobin 05/09/2014 11.4* 12.0 - 15.0 g/dL Final  . HCT 05/09/2014 37.1  36.0 - 46.0 % Final  . MCV 05/09/2014 87.1  78.0 - 100.0 fL Final  . MCH 05/09/2014 26.8  26.0 - 34.0 pg Final  . MCHC 05/09/2014 30.7  30.0 - 36.0 g/dL Final  . RDW 05/09/2014 20.6* 11.5 - 15.5 % Final  . Platelets  05/09/2014 252  150 - 400 K/uL Final  . Neutrophils Relative % 05/09/2014 79* 43 - 77 % Final  . Neutro Abs 05/09/2014 6.7  1.7 - 7.7 K/uL Final  . Lymphocytes Relative 05/09/2014 9* 12 - 46 % Final  . Lymphs Abs 05/09/2014 0.8  0.7 - 4.0 K/uL Final  . Monocytes Relative 05/09/2014 10  3 - 12 % Final  . Monocytes Absolute 05/09/2014 0.8  0.1 - 1.0 K/uL Final  . Eosinophils Relative 05/09/2014 1  0 - 5 % Final  . Eosinophils Absolute 05/09/2014 0.1   0.0 - 0.7 K/uL Final  . Basophils Relative 05/09/2014 1  0 - 1 % Final  . Basophils Absolute 05/09/2014 0.1  0.0 - 0.1 K/uL Final  . Sodium 05/09/2014 135  135 - 145 mmol/L Final  . Potassium 05/09/2014 3.7  3.5 - 5.1 mmol/L Final  . Chloride 05/09/2014 93* 96 - 112 mmol/L Final  . CO2 05/09/2014 30  19 - 32 mmol/L Final  . Glucose, Bld 05/09/2014 299* 70 - 99 mg/dL Final  . BUN 05/09/2014 43* 6 - 23 mg/dL Final  . Creatinine, Ser 05/09/2014 3.49* 0.50 - 1.10 mg/dL Final  . Calcium 05/09/2014 8.4  8.4 - 10.5 mg/dL Final  . Total Protein 05/09/2014 7.0  6.0 - 8.3 g/dL Final  . Albumin 05/09/2014 2.1* 3.5 - 5.2 g/dL Final  . AST 05/09/2014 14  0 - 37 U/L Final  . ALT 05/09/2014 11  0 - 35 U/L Final  . Alkaline Phosphatase 05/09/2014 152* 39 - 117 U/L Final  . Total Bilirubin 05/09/2014 0.4  0.3 - 1.2 mg/dL Final  . GFR calc non Af Amer 05/09/2014 12* >90 mL/min Final  . GFR calc Af Amer 05/09/2014 14* >90 mL/min Final   Comment: (NOTE) The eGFR has been calculated using the CKD EPI equation. This calculation has not been validated in all clinical situations. eGFR's persistently <90 mL/min signify possible Chronic Kidney Disease.   . Anion gap 05/09/2014 12  5 - 15 Final  . Lactic Acid, Venous 05/09/2014 2.59* 0.5 - 2.0 mmol/L Final  . Comment 05/09/2014 NOTIFIED PHYSICIAN   Final  . Specimen Description 05/09/2014 BLOOD RIGHT FOREARM   Final  . Special Requests 05/09/2014 BOTTLES DRAWN AEROBIC AND ANAEROBIC 5CC   Final  . Culture 05/09/2014    Final                   Value:NO GROWTH 5 DAYS Performed at [facility]   . Report Status 05/09/2014 05/16/2014 FINAL   Final  . Specimen Description 05/09/2014 BLOOD ARM RIGHT   Final  . Special Requests 05/09/2014 BOTTLES DRAWN AEROBIC AND ANAEROBIC 5CC   Final  . Culture 05/09/2014    Final                   Value:NO GROWTH 5 DAYS Performed at [facility]   . Report Status 05/09/2014 05/16/2014 FINAL   Final  . Hgb  A1c MFr Bld 05/09/2014 7.0* 4.8 - 5.6 % Final   Comment: (NOTE)         Pre-diabetes: 5.7 - 6.4         Diabetes: >6.4         Glycemic control for adults with diabetes: <7.0   . Mean Plasma Glucose 05/09/2014 154   Final   Comment: (NOTE) Performed At: [facility] [place], [place] [phone] Lindon Romp MD GG:[id number]   .  TSH 05/09/2014 1.242  0.350 - 4.500 uIU/mL Final  . WBC 05/10/2014 7.7  4.0 - 10.5 K/uL Final  . RBC 05/10/2014 4.10  3.87 - 5.11 MIL/uL Final  . Hemoglobin 05/10/2014 10.8* 12.0 - 15.0 g/dL Final  . HCT 05/10/2014 35.5* 36.0 - 46.0 % Final  . MCV 05/10/2014 86.6  78.0 - 100.0 fL Final  . MCH 05/10/2014 26.3  26.0 - 34.0 pg Final  . MCHC 05/10/2014 30.4  30.0 - 36.0 g/dL Final  . RDW 05/10/2014 20.3* 11.5 - 15.5 % Final  . Platelets 05/10/2014 271  150 - 400 K/uL Final  . Sodium 05/10/2014 136  135 - 145 mmol/L Final  . Potassium 05/10/2014 3.5  3.5 - 5.1 mmol/L Final  . Chloride 05/10/2014 95* 96 - 112 mmol/L Final  . CO2 05/10/2014 31  19 - 32 mmol/L Final  . Glucose, Bld 05/10/2014 45* 70 - 99 mg/dL Final  . BUN 05/10/2014 48* 6 - 23 mg/dL Final  . Creatinine, Ser 05/10/2014 3.92* 0.50 - 1.10 mg/dL Final  . Calcium 05/10/2014 8.0* 8.4 - 10.5 mg/dL Final  . GFR calc non Af Amer 05/10/2014 11* >90 mL/min Final  . GFR calc Af Amer 05/10/2014 12* >90 mL/min Final   Comment: (NOTE) The eGFR has been calculated using the CKD EPI equation. This calculation has not been validated in all clinical situations. eGFR's persistently <90 mL/min signify possible Chronic Kidney Disease.   . Anion gap 05/10/2014 10  5 - 15 Final  . Glucose-Capillary 05/09/2014 212* 70 - 99 mg/dL Final  . MRSA, PCR 05/09/2014 NEGATIVE  NEGATIVE Final  . Staphylococcus aureus 05/09/2014 NEGATIVE  NEGATIVE Final   Comment:        The Xpert SA Assay (FDA approved for NASAL specimens in patients over 68 years of age), is one component of a comprehensive  surveillance program.  Test performance has been validated by [facility] for patients greater than or equal to 22 year old. It is not intended to diagnose infection nor to guide or monitor treatment.   . Glucose-Capillary 05/10/2014 46* 70 - 99 mg/dL Final  . Comment 1 05/10/2014 Notify RN   Final  . Comment 2 05/10/2014 Call MD NNP PA CNM   Final  . Comment 3 05/10/2014 Document in Chart   Final  . Glucose-Capillary 05/10/2014 110* 70 - 99 mg/dL Final  . Glucose-Capillary 05/10/2014 123* 70 - 99 mg/dL Final  . Glucose-Capillary 05/10/2014 88  70 - 99 mg/dL Final  . Glucose-Capillary 05/10/2014 80  70 - 99 mg/dL Final  . Sodium 05/11/2014 136  135 - 145 mmol/L Final  . Potassium 05/11/2014 4.5  3.5 - 5.1 mmol/L Final   DELTA CHECK NOTED  . Chloride 05/11/2014 98  96 - 112 mmol/L Final  . CO2 05/11/2014 29  19 - 32 mmol/L Final  . Glucose, Bld 05/11/2014 114* 70 - 99 mg/dL Final  . BUN 05/11/2014 24* 6 - 23 mg/dL Final   DELTA CHECK NOTED  . Creatinine, Ser 05/11/2014 3.12* 0.50 - 1.10 mg/dL Final  . Calcium 05/11/2014 8.2* 8.4 - 10.5 mg/dL Final  . GFR calc non Af Amer 05/11/2014 14* >90 mL/min Final  . GFR calc Af Amer 05/11/2014 16* >90 mL/min Final   Comment: (NOTE) The eGFR has been calculated using the CKD EPI equation. This calculation has not been validated in all clinical situations. eGFR's persistently <90 mL/min signify possible Chronic Kidney Disease.   . Anion gap 05/11/2014 9  5 - 15 Final  . WBC 05/11/2014 9.7  4.0 - 10.5 K/uL Final  . RBC 05/11/2014 4.11  3.87 - 5.11 MIL/uL Final  . Hemoglobin 05/11/2014 10.8* 12.0 - 15.0 g/dL Final  . HCT 05/11/2014 37.1  36.0 - 46.0 % Final  . MCV 05/11/2014 90.3  78.0 - 100.0 fL Final  . MCH 05/11/2014 26.3  26.0 - 34.0 pg Final  . MCHC 05/11/2014 29.1* 30.0 - 36.0 g/dL Final  . RDW 05/11/2014 21.0* 11.5 - 15.5 % Final  . Platelets 05/11/2014 213  150 - 400 K/uL Final  . Glucose-Capillary 05/10/2014 69* 70 - 99 mg/dL  Final  . Glucose-Capillary 05/10/2014 69* 70 - 99 mg/dL Final  . Glucose-Capillary 05/10/2014 96  70 - 99 mg/dL Final  . Glucose-Capillary 05/11/2014 114* 70 - 99 mg/dL Final  . Glucose-Capillary 05/11/2014 143* 70 - 99 mg/dL Final  . Sodium 05/12/2014 135  135 - 145 mmol/L Final  . Potassium 05/12/2014 5.0  3.5 - 5.1 mmol/L Final  . Chloride 05/12/2014 97  96 - 112 mmol/L Final  . CO2 05/12/2014 29  19 - 32 mmol/L Final  . Glucose, Bld 05/12/2014 151* 70 - 99 mg/dL Final  . BUN 05/12/2014 52* 6 - 23 mg/dL Final  . Creatinine, Ser 05/12/2014 4.52* 0.50 - 1.10 mg/dL Final  . Calcium 05/12/2014 8.1* 8.4 - 10.5 mg/dL Final  . GFR calc non Af Amer 05/12/2014 9* >90 mL/min Final  . GFR calc Af Amer 05/12/2014 10* >90 mL/min Final   Comment: (NOTE) The eGFR has been calculated using the CKD EPI equation. This calculation has not been validated in all clinical situations. eGFR's persistently <90 mL/min signify possible Chronic Kidney Disease.   . Anion gap 05/12/2014 9  5 - 15 Final  . WBC 05/12/2014 11.9* 4.0 - 10.5 K/uL Final  . RBC 05/12/2014 3.87  3.87 - 5.11 MIL/uL Final  . Hemoglobin 05/12/2014 10.4* 12.0 - 15.0 g/dL Final  . HCT 05/12/2014 33.8* 36.0 - 46.0 % Final  . MCV 05/12/2014 87.3  78.0 - 100.0 fL Final  . MCH 05/12/2014 26.9  26.0 - 34.0 pg Final  . MCHC 05/12/2014 30.8  30.0 - 36.0 g/dL Final  . RDW 05/12/2014 20.5* 11.5 - 15.5 % Final  . Platelets 05/12/2014 225  150 - 400 K/uL Final  . Glucose-Capillary 05/11/2014 188* 70 - 99 mg/dL Final  . Glucose-Capillary 05/11/2014 74  70 - 99 mg/dL Final  . Glucose-Capillary 05/12/2014 115* 70 - 99 mg/dL Final  . Glucose-Capillary 05/12/2014 122* 70 - 99 mg/dL Final  . Glucose-Capillary 05/12/2014 116* 70 - 99 mg/dL Final  . Glucose-Capillary 05/12/2014 136* 70 - 99 mg/dL Final  . Glucose-Capillary 05/12/2014 168* 70 - 99 mg/dL Final  . Glucose-Capillary 05/12/2014 158* 70 - 99 mg/dL Final  . Glucose-Capillary 05/12/2014 162*  70 - 99 mg/dL Final  . Glucose-Capillary 05/13/2014 134* 70 - 99 mg/dL Final  . Glucose-Capillary 05/13/2014 110* 70 - 99 mg/dL Final  . Glucose-Capillary 05/13/2014 136* 70 - 99 mg/dL Final  Admission on 04/28/2014, Discharged on 04/28/2014  Component Date Value Ref Range Status  . Sodium 04/28/2014 136  135 - 145 mmol/L Final  . Potassium 04/28/2014 4.6  3.5 - 5.1 mmol/L Final  . Glucose, Bld 04/28/2014 234* 70 - 99 mg/dL Final  . HCT 04/28/2014 35.0* 36.0 - 46.0 % Final  . Hemoglobin 04/28/2014 11.9* 12.0 - 15.0 g/dL Final  . Glucose-Capillary 04/28/2014 230* 70 - 99 mg/dL Final  .  Glucose-Capillary 04/28/2014 177* 70 - 99 mg/dL Final  . Glucose-Capillary 04/28/2014 186* 70 - 99 mg/dL Final  . Comment 1 04/28/2014 Notify RN   Final  Admission on 03/17/2014, Discharged on 03/28/2014  No results displayed because visit has over 200 results.    Office Visit on 03/10/2014  Component Date Value Ref Range Status  . POC Glucose 03/10/2014 236* 70 - 99 mg/dl Final    Dg Chest 2 View  05/09/2014   CLINICAL DATA:  Cough.  EXAM: CHEST - 2 VIEW  COMPARISON:  Two-view chest 04/28/2014.  FINDINGS: The heart is mildly enlarged. A right IJ dialysis catheter is in place. Aeration is improved. Effusions are near completely resolved. There is no significant edema.  IMPRESSION: 1. Stable mild cardiomegaly. 2. Decreased pleural effusions and improved aeration.   Electronically Signed   By: San Morelle M.D.   On: 05/09/2014 17:18   Dg Foot Complete Left  05/09/2014   CLINICAL DATA:  Pain/lacerations on left foot  EXAM: LEFT FOOT - COMPLETE 3+ VIEW  COMPARISON:  MRI left foot dated 03/17/2014  FINDINGS: Status post mid tarsal amputation of the 2nd digit. Bony sequestrum involving the residual 2nd metatarsal, indicating residual osteomyelitis. Associated soft tissue swelling with subcutaneous gas.  Additional cortical irregularity involving the medial aspect of the 3rd metatarsal head, indicating  acute osteomyelitis.  Additional cortical irregularity involving the base/proximal shaft of the 3rd proximal phalanx. Associated pathologic fracture.  IMPRESSION: Acute osteomyelitis involving in the residual 2nd metatarsal, 3rd metatarsal head, and 3rd proximal phalanx.  Associated pathologic fracture of the 3rd proximal phalanx.   Electronically Signed   By: Julian Hy M.D.   On: 05/09/2014 17:24     Assessment/Plan   ICD-9-CM ICD-10-CM   1. S/P BKA (below knee amputation) unilateral, left V49.75 Z89.512   2. End stage renal disease-on HD 585.6 N18.6   3. Protein-calorie malnutrition, severe 262 E43   4. LVF 30% due to #7 429.9 I51.9   5. Type 2 diabetes mellitus with diabetic chronic kidney disease - controlled; cont insulin regimen 250.40 E11.22    585.9 N18.9   6. Essential hypertension, benign - controlled; cont meds 401.1 I10   7. Chronic systolic heart failure - stable; cont meds 428.22 I50.22   8. 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       N/V/D probable noninfectious etiology  --clear liquid diet and advance as tolerated. Rx Promethazine 52m IM q6hrs prn N/V  --continue other medications as ordered. She has percocet prn pain  --continue nutritional supplements  --PT/OT as indicated per Ortho  --f/u with HD as scheduled   --GOAL: short term rehab and d/c home when medically appropriate. Communicated with pt and nursing.  --will follow  Bergen Melle S. CPerlie Gold PAscension [facility] Centerand [facility] [address]GCampbellsburg [place] [phone] (Wednesdays and Fridays 8 AM - 5 PM) ([phone] (Monday-Friday 8 AM - 5 PM)

## 2014-06-02 ENCOUNTER — Encounter: Payer: Self-pay | Admitting: Vascular Surgery

## 2014-06-03 ENCOUNTER — Other Ambulatory Visit: Payer: Self-pay | Admitting: *Deleted

## 2014-06-03 ENCOUNTER — Encounter: Payer: Self-pay | Admitting: Vascular Surgery

## 2014-06-03 ENCOUNTER — Ambulatory Visit (INDEPENDENT_AMBULATORY_CARE_PROVIDER_SITE_OTHER): Payer: Self-pay | Admitting: Vascular Surgery

## 2014-06-03 VITALS — BP 122/77 | HR 89 | Resp 16 | Ht 63.0 in | Wt 87.0 lb

## 2014-06-03 DIAGNOSIS — I739 Peripheral vascular disease, unspecified: Secondary | ICD-10-CM

## 2014-06-03 NOTE — Progress Notes (Signed)
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 She had extensive revascularization with Dr. Darrick Penna. She had a second toe amputation which continued to be nonhealing. She had progressive gangrenous changes and severe pain. She underwent a left below-knee amputation by me while Dr. Darrick Penna was unavailable. She did well and was discharged home. She is at [facility] nursing facility  Quite good today. She reports no pain. Her amputation is well-healed from her 05/10/2014 surgery. He does have palpable left popliteal pulse.  Impression and plan stable status post left below-knee amputation on 05/10/2014. Staples are removed today. We see him again on an as-needed basis. Will refer to biotech.

## 2014-06-07 NOTE — Progress Notes (Signed)
Patient ID: Elizabeth [name], female   DOB: Jan 12, 1946, 69 y.o.   MRN: [id number]  [facility]     No Known Allergies     Chief Complaint  Patient presents with  . Medical Management of Chronic Issues    HPI:  She is a resident of this facility being seen for the management of her chronic illnesses. Her left 2nd toe wound has purulent drainage present. I have spoken with dialysis and will start her on iv vancomycin; and will culture her toe in an attempt to save her foot.    Past Medical History  Diagnosis Date  . Hyperlipidemia   . Urinary retention     DX NEUROGENIC BLADDER--HAS INDWELLING FOLEY CATHETER  . Arthritis   . Anemia   . CAD (coronary artery disease)   . Neuropathic pain   . Stroke     patient not aware.  . Foley catheter in place     for urinary retention  . Critical lower limb ischemia   . Hypertension   . Peripheral vascular disease   . Hypoglycemia 08/19/2013  . Hypokalemia 08/19/2013  . Diabetes mellitus     TAKES INSULIN   . ESRD (end stage renal disease)     [address] T, TH, S    Past Surgical History  Procedure Laterality Date  . Insertion of suprapubic catheter N/A 05/16/2012    Procedure: INSERTION OF SUPRAPUBIC CATHETER;  Surgeon: Sebastian Ache, MD;  Location: [facility] ORS;  Service: Urology;  Laterality: N/A;  . Bascilic vein transposition Left 10/30/2012    Procedure: LEFT 1ST STAGE BASCILIC VEIN TRANSPOSITION;  Surgeon: Chuck Hint, MD;  Location: [facility] OR;  Service: Vascular;  Laterality: Left;  . Nm myocar perf wall motion  04/19/2012    low risk study-EF 44%,mild inferolateral hypkinesis  . Bascilic vein transposition Left 02/05/2013    Procedure: BASCILIC VEIN TRANSPOSITION 2ND STAGE;  Surgeon: Chuck Hint, MD;  Location: [facility] OR;  Service: Vascular;  Laterality: Left;  . Radiology with anesthesia N/A 10/25/2013    Procedure: RADIOLOGY WITH ANESTHESIA;  Surgeon: Medication Radiologist, MD;  Location: [facility] OR;  Service:  Radiology;  Laterality: N/A;  . Fistulogram Left 04/29/2013    Procedure: FISTULOGRAM;  Surgeon: Chuck Hint, MD;  Location: [facility] CATH LAB;  Service: Cardiovascular;  Laterality: Left;  . Angioplasty Left 04/29/2013    Procedure: ANGIOPLASTY;  Surgeon: Chuck Hint, MD;  Location: [facility] CATH LAB;  Service: Cardiovascular;  Laterality: Left;  . Abdominal aortagram N/A 03/19/2014    Procedure: ABDOMINAL Ronny Flurry;  Surgeon: Sherren Kerns, MD;  Location: [facility] CATH LAB;  Service: Cardiovascular;  Laterality: N/A;  . Left and right heart catheterization with coronary angiogram N/A 03/24/2014    Procedure: LEFT AND RIGHT HEART CATHETERIZATION WITH CORONARY ANGIOGRAM;  Surgeon: Marykay Lex, MD;  Location: [facility] CATH LAB;  Service: Cardiovascular;  Laterality: N/A;  . Endarterectomy popliteal Left 03/26/2014    Procedure: Left popliteal and peroneal artery endarterectomy with vein patch angioplasty;  Surgeon: Sherren Kerns, MD;  Location: [facility] OR;  Service: Vascular;  Laterality: Left;  . Amputation Left 03/26/2014    Procedure: AMPUTATION SECOND LEFT TOE;  Surgeon: Sherren Kerns, MD;  Location: [facility] OR;  Service: Vascular;  Laterality: Left;  . Av fistula placement Left 04/28/2014    Procedure: CREATION OF LEFT BRACHIOCEPHALIC  ARTERIOVENOUS (AV) FISTULA CREATION;  Surgeon: Fransisco Hertz, MD;  Location: [facility] OR;  Service: Vascular;  Laterality: Left;    VITAL  SIGNS BP 101/89 mmHg  Pulse 74  Ht 5\' 3"  (1.6 m)  Wt 81 lb (36.741 kg)  BMI 14.35 kg/m2   Outpatient Encounter Prescriptions as of 05/08/2014  Medication Sig  Asa 81 mg daily coreg 6.25 mg twice daily  Cyproheptadine 4 mg daily  Colace 100 mg daily  Doxycycline 0 mg twice daily through 04-03-14 lantus 7 units daily  novolog 5 units with meals for cbg >=150 Percocet 5/325 mg every 8 hours as needed pravachol 20 mg daily  renvela 800 mg three times daily  Lasix 40 mg daily    SIGNIFICANT DIAGNOSTIC EXAMS   05-04-14: chest x-ray:  mild chf with bilateral air space disease and small bilateral pleural effusion   LABS REVIEWED:   04-01-14: wbc 12.1; hgb 9.6; hct 34.7; mcv 92.4; plt 434 05-02-14: urine culture: neg     ROS Constitutional: Negative for malaise/fatigue.  Respiratory: Negative for cough and shortness of breath.   Cardiovascular: Negative for chest pain.  Gastrointestinal: Negative for heartburn and abdominal pain.  Musculoskeletal: Negative for myalgias and joint pain.  Skin:       Left second toe: with purulent drainage present    Psychiatric/Behavioral: The patient is not nervous/anxious.      Physical Exam Constitutional: No distress.  Frail   Neck: Neck supple. No JVD present.  Cardiovascular: Normal rate, regular rhythm and intact distal pulses.   Murmur heard. Respiratory: Effort normal and breath sounds normal. No respiratory distress.  GI: Soft. Bowel sounds are normal. She exhibits no distension. There is no tenderness.  Musculoskeletal: She exhibits no edema.  Is able to move extremities   Neurological: She is alert.  Skin: Skin is warm and dry. She is not diaphoretic.  Left arm A/V fistula: +thril +bruit Left second toe: purulent drainage present.       ASSESSMENT/ PLAN:  1. Left second toe wound: will have dialysis begin vancomycin IV for 3 weeks; will culture drainage.   2. Diabetes: her cbg's are elevated; will increase lantus to 9 units daily will continue novolog 5 units prior to meals for cbg >=150; will monitor   3. Dyslipidemia: will continue pravachol 20 mg daily   4. ESRD on hemodialysis: will stop the lasix; is followed by nephrology; will continue hemodialysis 3 days per week; will continue renvela 800 mg three times daily  5. Ischemic cardiomyopathy: ef is 30%; due to her low blood pressure will lower her coreg to 3.125 mg twice daily and will continue asa 81 mg daily     Synthia Innocent NP [facility] Adult Medicine  Contact [phone] Monday through Friday  8am- 5pm  After hours call [phone]

## 2014-06-12 ENCOUNTER — Emergency Department (HOSPITAL_COMMUNITY): Payer: Medicare Other

## 2014-06-12 ENCOUNTER — Inpatient Hospital Stay (HOSPITAL_COMMUNITY)
Admission: EM | Admit: 2014-06-12 | Discharge: 2014-06-16 | DRG: 377 | Disposition: A | Discharge status: Skilled Nursing Facility | Payer: Medicare Other | Attending: Internal Medicine | Admitting: Internal Medicine

## 2014-06-12 ENCOUNTER — Encounter: Payer: Self-pay | Admitting: Vascular Surgery

## 2014-06-12 ENCOUNTER — Other Ambulatory Visit (HOSPITAL_COMMUNITY): Payer: Self-pay

## 2014-06-12 ENCOUNTER — Encounter (HOSPITAL_COMMUNITY): Payer: Self-pay | Admitting: Emergency Medicine

## 2014-06-12 DIAGNOSIS — R1110 Vomiting, unspecified: Secondary | ICD-10-CM | POA: Diagnosis present

## 2014-06-12 DIAGNOSIS — L89151 Pressure ulcer of sacral region, stage 1: Secondary | ICD-10-CM | POA: Diagnosis present

## 2014-06-12 DIAGNOSIS — I120 Hypertensive chronic kidney disease with stage 5 chronic kidney disease or end stage renal disease: Secondary | ICD-10-CM | POA: Diagnosis present

## 2014-06-12 DIAGNOSIS — R000 Tachycardia, unspecified: Secondary | ICD-10-CM | POA: Diagnosis present

## 2014-06-12 DIAGNOSIS — E43 Unspecified severe protein-calorie malnutrition: Secondary | ICD-10-CM | POA: Diagnosis present

## 2014-06-12 DIAGNOSIS — R110 Nausea: Secondary | ICD-10-CM

## 2014-06-12 DIAGNOSIS — D638 Anemia in other chronic diseases classified elsewhere: Secondary | ICD-10-CM | POA: Diagnosis present

## 2014-06-12 DIAGNOSIS — Z89512 Acquired absence of left leg below knee: Secondary | ICD-10-CM

## 2014-06-12 DIAGNOSIS — J811 Chronic pulmonary edema: Secondary | ICD-10-CM | POA: Diagnosis present

## 2014-06-12 DIAGNOSIS — E1121 Type 2 diabetes mellitus with diabetic nephropathy: Secondary | ICD-10-CM | POA: Diagnosis present

## 2014-06-12 DIAGNOSIS — R339 Retention of urine, unspecified: Secondary | ICD-10-CM | POA: Diagnosis present

## 2014-06-12 DIAGNOSIS — Z794 Long term (current) use of insulin: Secondary | ICD-10-CM

## 2014-06-12 DIAGNOSIS — N2581 Secondary hyperparathyroidism of renal origin: Secondary | ICD-10-CM | POA: Diagnosis present

## 2014-06-12 DIAGNOSIS — E875 Hyperkalemia: Secondary | ICD-10-CM | POA: Diagnosis present

## 2014-06-12 DIAGNOSIS — K920 Hematemesis: Secondary | ICD-10-CM | POA: Diagnosis present

## 2014-06-12 DIAGNOSIS — Z8673 Personal history of transient ischemic attack (TIA), and cerebral infarction without residual deficits: Secondary | ICD-10-CM

## 2014-06-12 DIAGNOSIS — Z681 Body mass index (BMI) 19 or less, adult: Secondary | ICD-10-CM

## 2014-06-12 DIAGNOSIS — E785 Hyperlipidemia, unspecified: Secondary | ICD-10-CM | POA: Diagnosis present

## 2014-06-12 DIAGNOSIS — D696 Thrombocytopenia, unspecified: Secondary | ICD-10-CM | POA: Diagnosis present

## 2014-06-12 DIAGNOSIS — N189 Chronic kidney disease, unspecified: Secondary | ICD-10-CM

## 2014-06-12 DIAGNOSIS — N186 End stage renal disease: Secondary | ICD-10-CM | POA: Diagnosis present

## 2014-06-12 DIAGNOSIS — R197 Diarrhea, unspecified: Secondary | ICD-10-CM | POA: Diagnosis present

## 2014-06-12 DIAGNOSIS — K529 Noninfective gastroenteritis and colitis, unspecified: Secondary | ICD-10-CM | POA: Diagnosis present

## 2014-06-12 DIAGNOSIS — I2510 Atherosclerotic heart disease of native coronary artery without angina pectoris: Secondary | ICD-10-CM | POA: Diagnosis present

## 2014-06-12 DIAGNOSIS — Z992 Dependence on renal dialysis: Secondary | ICD-10-CM | POA: Diagnosis not present

## 2014-06-12 DIAGNOSIS — K220 Achalasia of cardia: Secondary | ICD-10-CM

## 2014-06-12 DIAGNOSIS — E119 Type 2 diabetes mellitus without complications: Secondary | ICD-10-CM

## 2014-06-12 DIAGNOSIS — R112 Nausea with vomiting, unspecified: Secondary | ICD-10-CM | POA: Diagnosis present

## 2014-06-12 DIAGNOSIS — Z89519 Acquired absence of unspecified leg below knee: Secondary | ICD-10-CM

## 2014-06-12 DIAGNOSIS — E1122 Type 2 diabetes mellitus with diabetic chronic kidney disease: Secondary | ICD-10-CM | POA: Diagnosis present

## 2014-06-12 DIAGNOSIS — I739 Peripheral vascular disease, unspecified: Secondary | ICD-10-CM | POA: Diagnosis present

## 2014-06-12 HISTORY — DX: Gout, unspecified: M10.9

## 2014-06-12 HISTORY — DX: Hematemesis: K92.0

## 2014-06-12 LAB — URINALYSIS, ROUTINE W REFLEX MICROSCOPIC
Bilirubin Urine: NEGATIVE
Glucose, UA: NEGATIVE mg/dL
KETONES UR: NEGATIVE mg/dL
NITRITE: NEGATIVE
PH: 7 (ref 5.0–8.0)
Protein, ur: 100 mg/dL — AB
Specific Gravity, Urine: 1.013 (ref 1.005–1.030)
UROBILINOGEN UA: 0.2 mg/dL (ref 0.0–1.0)

## 2014-06-12 LAB — COMPREHENSIVE METABOLIC PANEL
ALK PHOS: 138 U/L — AB (ref 39–117)
ALT: 36 U/L — AB (ref 0–35)
AST: 31 U/L (ref 0–37)
Albumin: 2.3 g/dL — ABNORMAL LOW (ref 3.5–5.2)
Anion gap: 11 (ref 5–15)
BILIRUBIN TOTAL: 0.6 mg/dL (ref 0.3–1.2)
BUN: 73 mg/dL — AB (ref 6–23)
CHLORIDE: 101 mmol/L (ref 96–112)
CO2: 24 mmol/L (ref 19–32)
Calcium: 8.1 mg/dL — ABNORMAL LOW (ref 8.4–10.5)
Creatinine, Ser: 4 mg/dL — ABNORMAL HIGH (ref 0.50–1.10)
GFR calc Af Amer: 12 mL/min — ABNORMAL LOW (ref >90)
GFR calc non Af Amer: 10 mL/min — ABNORMAL LOW (ref >90)
GLUCOSE: 172 mg/dL — AB (ref 70–99)
POTASSIUM: 5.4 mmol/L — AB (ref 3.5–5.1)
SODIUM: 136 mmol/L (ref 135–145)
Total Protein: 6.2 g/dL (ref 6.0–8.3)

## 2014-06-12 LAB — CBC WITH DIFFERENTIAL/PLATELET
BASOS ABS: 0 10*3/uL (ref 0.0–0.1)
BASOS PCT: 0 % (ref 0–1)
EOS PCT: 1 % (ref 0–5)
Eosinophils Absolute: 0.1 10*3/uL (ref 0.0–0.7)
HCT: 44.4 % (ref 36.0–46.0)
Hemoglobin: 14 g/dL (ref 12.0–15.0)
Lymphocytes Relative: 6 % — ABNORMAL LOW (ref 12–46)
Lymphs Abs: 0.6 10*3/uL — ABNORMAL LOW (ref 0.7–4.0)
MCH: 26.3 pg (ref 26.0–34.0)
MCHC: 31.5 g/dL (ref 30.0–36.0)
MCV: 83.5 fL (ref 78.0–100.0)
Monocytes Absolute: 0.5 10*3/uL (ref 0.1–1.0)
Monocytes Relative: 5 % (ref 3–12)
NEUTROS PCT: 88 % — AB (ref 43–77)
Neutro Abs: 8.3 10*3/uL — ABNORMAL HIGH (ref 1.7–7.7)
PLATELETS: 168 10*3/uL (ref 150–400)
RBC: 5.32 MIL/uL — ABNORMAL HIGH (ref 3.87–5.11)
RDW: 19 % — AB (ref 11.5–15.5)
WBC: 9.4 10*3/uL (ref 4.0–10.5)

## 2014-06-12 LAB — CBC
HEMATOCRIT: 39 % (ref 36.0–46.0)
Hemoglobin: 12.1 g/dL (ref 12.0–15.0)
MCH: 25.7 pg — ABNORMAL LOW (ref 26.0–34.0)
MCHC: 31 g/dL (ref 30.0–36.0)
MCV: 82.8 fL (ref 78.0–100.0)
Platelets: 166 10*3/uL (ref 150–400)
RBC: 4.71 MIL/uL (ref 3.87–5.11)
RDW: 18.5 % — ABNORMAL HIGH (ref 11.5–15.5)
WBC: 14.3 10*3/uL — AB (ref 4.0–10.5)

## 2014-06-12 LAB — OCCULT BLOOD GASTRIC / DUODENUM (SPECIMEN CUP): OCCULT BLOOD, GASTRIC: POSITIVE — AB

## 2014-06-12 LAB — URINE MICROSCOPIC-ADD ON

## 2014-06-12 LAB — TYPE AND SCREEN
ABO/RH(D): O POS
ANTIBODY SCREEN: NEGATIVE

## 2014-06-12 LAB — MRSA PCR SCREENING: MRSA by PCR: NEGATIVE

## 2014-06-12 LAB — GLUCOSE, CAPILLARY: GLUCOSE-CAPILLARY: 162 mg/dL — AB (ref 70–99)

## 2014-06-12 LAB — POC OCCULT BLOOD, ED: FECAL OCCULT BLD: NEGATIVE

## 2014-06-12 MED ORDER — DARBEPOETIN ALFA 200 MCG/0.4ML IJ SOSY: 200.0000 ug | PREFILLED_SYRINGE | INTRAMUSCULAR | Status: DC

## 2014-06-12 MED ORDER — IPRATROPIUM BROMIDE 0.02 % IN SOLN
0.5000 mg | RESPIRATORY_TRACT | Status: DC | PRN
Start: 2014-06-12 — End: 2014-06-16

## 2014-06-12 MED ORDER — ACETAMINOPHEN 325 MG PO TABS
650.0000 mg | ORAL_TABLET | Freq: Four times a day (QID) | ORAL | Status: DC | PRN
  Administered 2014-06-14: 650 mg via ORAL
  Filled 2014-06-12: qty 2

## 2014-06-12 MED ORDER — ACETAMINOPHEN 650 MG RE SUPP: 650.0000 mg | Freq: Four times a day (QID) | RECTAL | Status: DC | PRN

## 2014-06-12 MED ORDER — PRO-STAT SUGAR FREE PO LIQD
30.0000 mL | Freq: Three times a day (TID) | ORAL | Status: DC
  Administered 2014-06-15 – 2014-06-16 (×4): 30 mL via ORAL
  Filled 2014-06-12 (×12): qty 30

## 2014-06-12 MED ORDER — METOPROLOL TARTRATE 1 MG/ML IV SOLN
2.5000 mg | Freq: Three times a day (TID) | INTRAVENOUS | Status: DC
  Administered 2014-06-12 – 2014-06-16 (×11): 2.5 mg via INTRAVENOUS
  Filled 2014-06-12 (×14): qty 5

## 2014-06-12 MED ORDER — DOCUSATE SODIUM 100 MG PO CAPS
100.0000 mg | ORAL_CAPSULE | Freq: Two times a day (BID) | ORAL | Status: DC
  Administered 2014-06-12 – 2014-06-15 (×5): 100 mg via ORAL
  Filled 2014-06-12 (×9): qty 1

## 2014-06-12 MED ORDER — HYDROGEL EX GEL: 1.0000 "application " | CUTANEOUS | Status: DC | PRN

## 2014-06-12 MED ORDER — DOCUSATE SODIUM 100 MG PO CAPS: 100.0000 mg | ORAL_CAPSULE | Freq: Every day | ORAL | Status: DC

## 2014-06-12 MED ORDER — PRAVASTATIN SODIUM 20 MG PO TABS
20.0000 mg | ORAL_TABLET | Freq: Every day | ORAL | Status: DC
  Administered 2014-06-12 – 2014-06-16 (×5): 20 mg via ORAL
  Filled 2014-06-12 (×6): qty 1

## 2014-06-12 MED ORDER — ONDANSETRON HCL 4 MG/2ML IJ SOLN
4.0000 mg | Freq: Once | INTRAMUSCULAR | Status: AC
  Administered 2014-06-12: 4 mg via INTRAVENOUS
  Filled 2014-06-12: qty 2

## 2014-06-12 MED ORDER — INSULIN ASPART 100 UNIT/ML ~~LOC~~ SOLN: 0.0000 [IU] | Freq: Three times a day (TID) | SUBCUTANEOUS | Status: DC

## 2014-06-12 MED ORDER — SODIUM CHLORIDE 0.9 % IJ SOLN: 3.0000 mL | INTRAMUSCULAR | Status: DC | PRN

## 2014-06-12 MED ORDER — SODIUM CHLORIDE 0.9 % IJ SOLN
3.0000 mL | Freq: Two times a day (BID) | INTRAMUSCULAR | Status: DC
  Administered 2014-06-12 – 2014-06-16 (×6): 3 mL via INTRAVENOUS

## 2014-06-12 MED ORDER — SODIUM CHLORIDE 0.9 % IJ SOLN
3.0000 mL | Freq: Two times a day (BID) | INTRAMUSCULAR | Status: DC
  Administered 2014-06-13 – 2014-06-16 (×3): 3 mL via INTRAVENOUS

## 2014-06-12 MED ORDER — DECUBI-VITE PO CAPS
2.0000 | ORAL_CAPSULE | Freq: Every day | ORAL | Status: DC
Start: 2014-06-12 — End: 2014-06-12

## 2014-06-12 MED ORDER — SACCHAROMYCES BOULARDII 250 MG PO CAPS
250.0000 mg | ORAL_CAPSULE | Freq: Two times a day (BID) | ORAL | Status: DC
  Administered 2014-06-12 – 2014-06-16 (×7): 250 mg via ORAL
  Filled 2014-06-12 (×10): qty 1

## 2014-06-12 MED ORDER — ALBUTEROL SULFATE (2.5 MG/3ML) 0.083% IN NEBU: 2.5000 mg | INHALATION_SOLUTION | RESPIRATORY_TRACT | Status: DC | PRN

## 2014-06-12 MED ORDER — DOXERCALCIFEROL 4 MCG/2ML IV SOLN
1.0000 ug | INTRAVENOUS | Status: DC
  Administered 2014-06-14: 1 ug via INTRAVENOUS
  Filled 2014-06-12: qty 2

## 2014-06-12 MED ORDER — OXYCODONE-ACETAMINOPHEN 5-325 MG PO TABS: 1.0000 | ORAL_TABLET | Freq: Three times a day (TID) | ORAL | Status: DC | PRN

## 2014-06-12 MED ORDER — SODIUM CHLORIDE 0.9 % IV BOLUS (SEPSIS)
500.0000 mL | Freq: Once | INTRAVENOUS | Status: AC
  Administered 2014-06-12: 500 mL via INTRAVENOUS

## 2014-06-12 MED ORDER — LORATADINE 10 MG PO TABS
10.0000 mg | ORAL_TABLET | Freq: Every day | ORAL | Status: DC | PRN
  Filled 2014-06-12: qty 1

## 2014-06-12 MED ORDER — ONDANSETRON HCL 4 MG PO TABS: 4.0000 mg | ORAL_TABLET | Freq: Four times a day (QID) | ORAL | Status: DC | PRN

## 2014-06-12 MED ORDER — SEVELAMER CARBONATE 800 MG PO TABS
800.0000 mg | ORAL_TABLET | Freq: Three times a day (TID) | ORAL | Status: DC
  Administered 2014-06-13 – 2014-06-16 (×7): 800 mg via ORAL
  Filled 2014-06-12 (×14): qty 1

## 2014-06-12 MED ORDER — HYDROCODONE-ACETAMINOPHEN 5-325 MG PO TABS: 1.0000 | ORAL_TABLET | ORAL | Status: DC | PRN

## 2014-06-12 MED ORDER — ONDANSETRON HCL 4 MG/2ML IJ SOLN
4.0000 mg | Freq: Four times a day (QID) | INTRAMUSCULAR | Status: DC | PRN
  Administered 2014-06-13 – 2014-06-14 (×3): 4 mg via INTRAVENOUS
  Filled 2014-06-12 (×2): qty 2

## 2014-06-12 MED ORDER — PANTOPRAZOLE SODIUM 40 MG IV SOLR: 40.0000 mg | INTRAVENOUS | Status: DC

## 2014-06-12 MED ORDER — ACETAMINOPHEN-CODEINE #3 300-30 MG PO TABS: 1.0000 | ORAL_TABLET | Freq: Four times a day (QID) | ORAL | Status: DC | PRN

## 2014-06-12 MED ORDER — RENA-VITE PO TABS
1.0000 | ORAL_TABLET | Freq: Every day | ORAL | Status: DC
  Administered 2014-06-13 – 2014-06-15 (×3): 1 via ORAL
  Filled 2014-06-12 (×4): qty 1

## 2014-06-12 MED ORDER — SORBITOL 70 % SOLN: 30.0000 mL | Freq: Every day | Status: DC | PRN

## 2014-06-12 MED ORDER — ZOLPIDEM TARTRATE 5 MG PO TABS: 5.0000 mg | ORAL_TABLET | Freq: Every evening | ORAL | Status: DC | PRN

## 2014-06-12 MED ORDER — PANTOPRAZOLE SODIUM 40 MG IV SOLR
40.0000 mg | Freq: Once | INTRAVENOUS | Status: AC
  Administered 2014-06-12: 40 mg via INTRAVENOUS
  Filled 2014-06-12: qty 40

## 2014-06-12 MED ORDER — INSULIN GLARGINE 100 UNIT/ML ~~LOC~~ SOLN
5.0000 [IU] | Freq: Every day | SUBCUTANEOUS | Status: DC
  Filled 2014-06-12 (×3): qty 0.05

## 2014-06-12 MED ORDER — SODIUM CHLORIDE 0.9 % IV SOLN: 250.0000 mL | INTRAVENOUS | Status: DC | PRN

## 2014-06-12 MED ORDER — NEPRO/CARBSTEADY PO LIQD
237.0000 mL | Freq: Every day | ORAL | Status: DC
  Administered 2014-06-12 – 2014-06-16 (×4): 237 mL via ORAL

## 2014-06-12 MED ORDER — PANTOPRAZOLE SODIUM 40 MG IV SOLR
40.0000 mg | Freq: Two times a day (BID) | INTRAVENOUS | Status: DC
  Administered 2014-06-12 – 2014-06-16 (×7): 40 mg via INTRAVENOUS
  Filled 2014-06-12 (×9): qty 40

## 2014-06-12 NOTE — Progress Notes (Signed)
Patient's SNF called to let LCSW know she is a current resident from [address] of [place]. If patient admitted, Unit CSW to be made aware and FL2 to be updated prior to returning.  If not admitted, patient to return back to SNF.  Evening CSW: Augusto Gamble made aware of patient.  Elizabeth [name], MSW Clinical Social Work: Emergency Room [phone]

## 2014-06-12 NOTE — ED Notes (Signed)
Per ems, pt reports n/v/d since midnight last.  Pt arrives awake, drowsy, vomiting black, liquid vomit

## 2014-06-12 NOTE — Consult Note (Signed)
Unassigned Consult  Reason for Consult: Coffee-ground emesis, nausea, vomiting, and diarrhea Referring Physician: [facility] HPI: This is a 69 year old female with a PMH of ESRD on dialysis, CAD, hyperlipidemia, HTN, PVD, and DM admitted for complaints of acute nausea, vomiting, diarrhea, and diffuse abdominal pain.  On a baseline she has an HGB that ranges from 8-10 g/dL.  She reports having intermittent vomiting.  Last month she vomited, but it was clear.  Also, she has issues with intermittent diarrhea and this is not a new issue for her.  She denies any history of hematemesis, melena, or hematochezia.  Many years ago she states having a similar event, but no further work up was pursued.  The patient denies any history of GERD, PUD, or NSAID use.  Past Medical History  Diagnosis Date  . Hyperlipidemia   . Urinary retention     DX NEUROGENIC BLADDER--HAS INDWELLING FOLEY CATHETER  . Arthritis   . Anemia   . CAD (coronary artery disease)   . Neuropathic pain   . Stroke     patient not aware.  . Foley catheter in place     for urinary retention  . Critical lower limb ischemia   . Hypertension   . Peripheral vascular disease   . Hypoglycemia 08/19/2013  . Hypokalemia 08/19/2013  . Diabetes mellitus     TAKES INSULIN   . ESRD (end stage renal disease)     [address] T, TH, S    Past Surgical History  Procedure Laterality Date  . Insertion of suprapubic catheter N/A 05/16/2012    Procedure: INSERTION OF SUPRAPUBIC CATHETER;  Surgeon: Sebastian Ache, MD;  Location: [facility]S;  Service: Urology;  Laterality: N/A;  . Bascilic vein transposition Left 10/30/2012    Procedure: LEFT 1ST STAGE BASCILIC VEIN TRANSPOSITION;  Surgeon: Chuck Hint, MD;  Location: [facility] OR;  Service: Vascular;  Laterality: Left;  . Nm myocar perf wall motion  04/19/2012    low risk study-EF 44%,mild inferolateral hypkinesis  . Bascilic vein transposition Left 02/05/2013   Procedure: BASCILIC VEIN TRANSPOSITION 2ND STAGE;  Surgeon: Chuck Hint, MD;  Location: [facility] OR;  Service: Vascular;  Laterality: Left;  . Radiology with anesthesia N/A 10/25/2013    Procedure: RADIOLOGY WITH ANESTHESIA;  Surgeon: Medication Radiologist, MD;  Location: [facility] OR;  Service: Radiology;  Laterality: N/A;  . Fistulogram Left 04/29/2013    Procedure: FISTULOGRAM;  Surgeon: Chuck Hint, MD;  Location: [facility] CATH LAB;  Service: Cardiovascular;  Laterality: Left;  . Angioplasty Left 04/29/2013    Procedure: ANGIOPLASTY;  Surgeon: Chuck Hint, MD;  Location: [facility] CATH LAB;  Service: Cardiovascular;  Laterality: Left;  . Abdominal aortagram N/A 03/19/2014    Procedure: ABDOMINAL Ronny Flurry;  Surgeon: Sherren Kerns, MD;  Location: [facility] CATH LAB;  Service: Cardiovascular;  Laterality: N/A;  . Left and right heart catheterization with coronary angiogram N/A 03/24/2014    Procedure: LEFT AND RIGHT HEART CATHETERIZATION WITH CORONARY ANGIOGRAM;  Surgeon: Marykay Lex, MD;  Location: [facility] CATH LAB;  Service: Cardiovascular;  Laterality: N/A;  . Endarterectomy popliteal Left 03/26/2014    Procedure: Left popliteal and peroneal artery endarterectomy with vein patch angioplasty;  Surgeon: Sherren Kerns, MD;  Location: [facility] OR;  Service: Vascular;  Laterality: Left;  . Amputation Left 03/26/2014    Procedure: AMPUTATION SECOND LEFT TOE;  Surgeon: Sherren Kerns, MD;  Location: [facility] OR;  Service: Vascular;  Laterality: Left;  . Av  fistula placement Left 04/28/2014    Procedure: CREATION OF LEFT BRACHIOCEPHALIC  ARTERIOVENOUS (AV) FISTULA CREATION;  Surgeon: Fransisco Hertz, MD;  Location: [facility] OR;  Service: Vascular;  Laterality: Left;  . Amputation Left 05/10/2014    Procedure: AMPUTATION BELOW KNEE;  Surgeon: Larina Earthly, MD;  Location: [facility] OR;  Service: Vascular;  Laterality: Left;    Family History  Problem Relation Age of Onset  . Hyperlipidemia Mother   . Hypertension Mother   . Hodgkin's  lymphoma Father   . Heart disease Sister     Social History:  reports that she has never smoked. She has never used smokeless tobacco. She reports that she does not drink alcohol or use illicit drugs.  Allergies: No Known Allergies  Medications: Scheduled: Continuous:  Results for orders placed or performed during the hospital encounter of 06/12/14 (from the past 24 hour(s))  CBC with Differential     Status: Abnormal   Collection Time: 06/12/14 12:17 PM  Result Value Ref Range   WBC 9.4 4.0 - 10.5 K/uL   RBC 5.32 (H) 3.87 - 5.11 MIL/uL   Hemoglobin 14.0 12.0 - 15.0 g/dL   HCT 54.0 08.6 - 76.1 %   MCV 83.5 78.0 - 100.0 fL   MCH 26.3 26.0 - 34.0 pg   MCHC 31.5 30.0 - 36.0 g/dL   RDW 95.0 (H) 93.2 - 67.1 %   Platelets 168 150 - 400 K/uL   Neutrophils Relative % 88 (H) 43 - 77 %   Neutro Abs 8.3 (H) 1.7 - 7.7 K/uL   Lymphocytes Relative 6 (L) 12 - 46 %   Lymphs Abs 0.6 (L) 0.7 - 4.0 K/uL   Monocytes Relative 5 3 - 12 %   Monocytes Absolute 0.5 0.1 - 1.0 K/uL   Eosinophils Relative 1 0 - 5 %   Eosinophils Absolute 0.1 0.0 - 0.7 K/uL   Basophils Relative 0 0 - 1 %   Basophils Absolute 0.0 0.0 - 0.1 K/uL  POC occult blood, ED Provider will collect     Status: None   Collection Time: 06/12/14 12:56 PM  Result Value Ref Range   Fecal Occult Bld NEGATIVE NEGATIVE  Urinalysis, Routine w reflex microscopic     Status: Abnormal   Collection Time: 06/12/14  1:00 PM  Result Value Ref Range   Color, Urine YELLOW YELLOW   APPearance TURBID (A) CLEAR   Specific Gravity, Urine 1.013 1.005 - 1.030   pH 7.0 5.0 - 8.0   Glucose, UA NEGATIVE NEGATIVE mg/dL   Hgb urine dipstick MODERATE (A) NEGATIVE   Bilirubin Urine NEGATIVE NEGATIVE   Ketones, ur NEGATIVE NEGATIVE mg/dL   Protein, ur 245 (A) NEGATIVE mg/dL   Urobilinogen, UA 0.2 0.0 - 1.0 mg/dL   Nitrite NEGATIVE NEGATIVE   Leukocytes, UA LARGE (A) NEGATIVE  Urine microscopic-add on     Status: Abnormal   Collection Time: 06/12/14   1:00 PM  Result Value Ref Range   Squamous Epithelial / LPF FEW (A) RARE   WBC, UA TOO NUMEROUS TO COUNT <3 WBC/hpf   RBC / HPF 3-6 <3 RBC/hpf   Bacteria, UA MANY (A) RARE   Casts GRANULAR CAST (A) NEGATIVE   Urine-Other MANY YEAST   Occult bld gastric/duodenum (cup to lab)     Status: Abnormal   Collection Time: 06/12/14  1:08 PM  Result Value Ref Range   pH, Gastric NOT DONE    Occult Blood, Gastric POSITIVE (A) NEGATIVE  Comprehensive  metabolic panel     Status: Abnormal   Collection Time: 06/12/14  3:41 PM  Result Value Ref Range   Sodium 136 135 - 145 mmol/L   Potassium 5.4 (H) 3.5 - 5.1 mmol/L   Chloride 101 96 - 112 mmol/L   CO2 24 19 - 32 mmol/L   Glucose, Bld 172 (H) 70 - 99 mg/dL   BUN 73 (H) 6 - 23 mg/dL   Creatinine, Ser 1.61 (H) 0.50 - 1.10 mg/dL   Calcium 8.1 (L) 8.4 - 10.5 mg/dL   Total Protein 6.2 6.0 - 8.3 g/dL   Albumin 2.3 (L) 3.5 - 5.2 g/dL   AST 31 0 - 37 U/L   ALT 36 (H) 0 - 35 U/L   Alkaline Phosphatase 138 (H) 39 - 117 U/L   Total Bilirubin 0.6 0.3 - 1.2 mg/dL   GFR calc non Af Amer 10 (L) >90 mL/min   GFR calc Af Amer 12 (L) >90 mL/min   Anion gap 11 5 - 15  Type and screen     Status: None (Preliminary result)   Collection Time: 06/12/14  4:29 PM  Result Value Ref Range   ABO/RH(D) O POS    Antibody Screen PENDING    Sample Expiration 06/15/2014      Dg Abd Acute W/chest  06/12/2014   CLINICAL DATA:  Acute nausea, vomiting and diarrhea.  EXAM: DG ABDOMEN ACUTE W/ 1V CHEST  COMPARISON:  May 09, 2014.  FINDINGS: There is no evidence of dilated bowel loops or free intraperitoneal air. No radiopaque calculi or other significant radiographic abnormality is seen. Stable cardiomediastinal silhouette. Right internal jugular dialysis catheter is unchanged in position. Moderate right pleural effusion is noted which is increased compared to prior exam. Mild diffuse interstitial densities are noted consistent with pulmonary edema.  IMPRESSION: No evidence of  bowel obstruction or ileus. Mild bilateral pulmonary edema is noted with moderate right pleural effusion.   Electronically Signed   By: Lupita Raider, M.D.   On: 06/12/2014 13:55    ROS:  As stated above in the HPI otherwise negative.  Blood pressure 129/77, pulse 116, temperature 97.8 F (36.6 C), temperature source Oral, resp. rate 15, height  (1.6 m), weight 37.195 kg (82 lb), SpO2 100 %.    PE: Gen: NAD, Alert and Oriented HEENT:  [place]/AT, EOMI Neck: Supple, no LAD Lungs: CTA Bilaterally CV: RRR without M/G/R ABM: Soft, epigastric tenderness, +BS Ext: No C/C/E  Assessment/Plan: 1) Coffee-ground emesis. 2) N/V. 3) Diarrhea. 4) ESRD.   The exam did elicit epigastric pain, but it appears that the pain is secondary to the vomiting.  I will pursue further work up with an EGD.  Plan: 1) EGD tomorrow. 2) Follow HGB and transfuse if necessary.  Chaundra Abreu D 06/12/2014, 5:29 PM

## 2014-06-12 NOTE — H&P (Signed)
[facility] History and Physical  Betsey Scharnhorst TLX:[id number] DOB: 1945/12/18 DOA: 06/12/2014  Referring physician: Dr. Rennis Chris PCP: [name], MD   Chief Complaint: Nausea Dimas Chyle Barron Alvine  HPI: Elizabeth [name] is a 69 y.o. female  With history of end-stage renal disease on hemodialysis Tuesdays Thursdays and Saturdays, history of peripheral vascular disease status post left BKA 05/10/2014, urinary retention with indwelling Foley catheter, coronary artery disease, hypertension, insulin-dependent diabetes mellitus who was at rehabilitation at [facility] presenting to the ED with a one-day history of nausea and coffee-ground emesis, diarrhea. Patient stated his symptoms started at night prior to admission. Patient stated that the emesis started as origin: Then subsequently Dr. coffee ground. Patient stated that emesis and stools were checked and emesis was positive for blood and a such patient was sent to the emergency room. Patient does endorse aspirin however denies any NSAID use. Patient denies any prior history of peptic ulcer disease. Patient does endorse a history of hemorrhoids. Patient denies any fever, no chills, no cough, no constipation, no hematochezia, no chest pain 0.0 shortness of breath. Patient does have some abdominal pain associated with emesis per patient. Patient doesn't dose some intermittent dysuria ongoing for months that comes and goes at this site of catheter insertion per patient. Patient denies any melanotic stools. Patient denies any hematochezia. Patient was seen in the emergency room noted to be slightly tachycardic. Gastric occult was positive. Comprehensive metabolic profile with a potassium of 5.4 BUN of 73 creatinine of 4.00 for status of 138 albumin of 2.3 ALT of 36 otherwise was within normal limits. Urinalysis was turbid, nitrite negative, large leukocytes, many yeast, too numerous to count WBCs, 3-6 RBCs, many bacteria, granular casts. CBC done had  a white count of 9.4 hemoglobin of 14 platelet count of 168 otherwise was within normal limits. Acute abdominal series which was done showed no evidence of bowel obstruction or ileus. Mild bilateral pulmonary edema was noted with moderate right pleural effusion. Patient was given PPI IV 1. Nephrology was consulted. GI was also consulted per ED physician. [facility] were called to admit the patient for further evaluation and management.   Review of Systems: As per history of present illness otherwise rest of review of systems is negative. Constitutional:  No weight loss, night sweats, Fevers, chills, fatigue.  HEENT:  No headaches, Difficulty swallowing,Tooth/dental problems,Sore throat,  No sneezing, itching, ear ache, nasal congestion, post nasal drip,  Cardio-vascular:  No chest pain, Orthopnea, PND, swelling in lower extremities, anasarca, dizziness, palpitations  GI:  No heartburn, indigestion, abdominal pain, nausea, vomiting, diarrhea, change in bowel habits, loss of appetite  Resp:  No shortness of breath with exertion or at rest. No excess mucus, no productive cough, No non-productive cough, No coughing up of blood.No change in color of mucus.No wheezing.No chest wall deformity  Skin:  no rash or lesions.  GU:  no dysuria, change in color of urine, no urgency or frequency. No flank pain.  Musculoskeletal:  No joint pain or swelling. No decreased range of motion. No back pain.  Psych:  No change in mood or affect. No depression or anxiety. No memory loss.   Past Medical History  Diagnosis Date  . Hyperlipidemia   . Urinary retention     DX NEUROGENIC BLADDER--HAS INDWELLING FOLEY CATHETER  . Arthritis   . Anemia   . CAD (coronary artery disease)   . Neuropathic pain   . Stroke     patient not aware.  . Foley catheter in  place     for urinary retention  . Critical lower limb ischemia   . Hypertension   . Peripheral vascular disease   . Hypoglycemia 08/19/2013    . Hypokalemia 08/19/2013  . Diabetes mellitus     TAKES INSULIN   . ESRD (end stage renal disease)     [address] [facility], TH, S   Past Surgical History  Procedure Laterality Date  . Insertion of suprapubic catheter N/A 05/16/2012    Procedure: INSERTION OF SUPRAPUBIC CATHETER;  Surgeon: Sebastian Ache, MD;  Location: [facility] ORS;  Service: Urology;  Laterality: N/A;  . Bascilic vein transposition Left 10/30/2012    Procedure: LEFT 1ST STAGE BASCILIC VEIN TRANSPOSITION;  Surgeon: Chuck Hint, MD;  Location: [facility] OR;  Service: Vascular;  Laterality: Left;  . Nm myocar perf wall motion  04/19/2012    low risk study-EF 44%,mild inferolateral hypkinesis  . Bascilic vein transposition Left 02/05/2013    Procedure: BASCILIC VEIN TRANSPOSITION 2ND STAGE;  Surgeon: Chuck Hint, MD;  Location: [facility] OR;  Service: Vascular;  Laterality: Left;  . Radiology with anesthesia N/A 10/25/2013    Procedure: RADIOLOGY WITH ANESTHESIA;  Surgeon: Medication Radiologist, MD;  Location: [facility] OR;  Service: Radiology;  Laterality: N/A;  . Fistulogram Left 04/29/2013    Procedure: FISTULOGRAM;  Surgeon: Chuck Hint, MD;  Location: [facility] CATH LAB;  Service: Cardiovascular;  Laterality: Left;  . Angioplasty Left 04/29/2013    Procedure: ANGIOPLASTY;  Surgeon: Chuck Hint, MD;  Location: [facility] CATH LAB;  Service: Cardiovascular;  Laterality: Left;  . Abdominal aortagram N/A 03/19/2014    Procedure: ABDOMINAL Ronny Flurry;  Surgeon: Sherren Kerns, MD;  Location: [facility] CATH LAB;  Service: Cardiovascular;  Laterality: N/A;  . Left and right heart catheterization with coronary angiogram N/A 03/24/2014    Procedure: LEFT AND RIGHT HEART CATHETERIZATION WITH CORONARY ANGIOGRAM;  Surgeon: Marykay Lex, MD;  Location: [facility] CATH LAB;  Service: Cardiovascular;  Laterality: N/A;  . Endarterectomy popliteal Left 03/26/2014    Procedure: Left popliteal and peroneal artery endarterectomy with vein patch angioplasty;  Surgeon:  Sherren Kerns, MD;  Location: [facility] OR;  Service: Vascular;  Laterality: Left;  . Amputation Left 03/26/2014    Procedure: AMPUTATION SECOND LEFT TOE;  Surgeon: Sherren Kerns, MD;  Location: [facility] OR;  Service: Vascular;  Laterality: Left;  . Av fistula placement Left 04/28/2014    Procedure: CREATION OF LEFT BRACHIOCEPHALIC  ARTERIOVENOUS (AV) FISTULA CREATION;  Surgeon: Fransisco Hertz, MD;  Location: [facility] OR;  Service: Vascular;  Laterality: Left;  . Amputation Left 05/10/2014    Procedure: AMPUTATION BELOW KNEE;  Surgeon: Larina Earthly, MD;  Location: [facility] OR;  Service: Vascular;  Laterality: Left;   Social History:  reports that she has never smoked. She has never used smokeless tobacco. She reports that she does not drink alcohol or use illicit drugs.  No Known Allergies  Family History  Problem Relation Age of Onset  . Hyperlipidemia Mother   . Hypertension Mother   . Hodgkin's lymphoma Father   . Heart disease Sister    family history has been reviewed.  Prior to Admission medications   Medication Sig Start Date End Date Taking? Authorizing Provider  acetaminophen-codeine (TYLENOL #3) 300-30 MG per tablet Take 1 tablet by mouth every 6 (six) hours as needed for moderate pain. 05/13/14  Yes Joseph Art, DO  Amino Acids-Protein Hydrolys (FEEDING SUPPLEMENT, PRO-STAT SUGAR FREE 64,) LIQD Take 30 mLs by mouth  3 (three) times daily with meals.   Yes Historical Provider, MD  aspirin EC 81 MG tablet Take 81 mg by mouth daily.   Yes Historical Provider, MD  carvedilol (COREG) 6.25 MG tablet take 1 tablet by mouth twice a day 04/17/14  Yes Chrystie Nose, MD  cetirizine (ZYRTEC) 10 MG tablet Take 10 mg by mouth as needed for allergies.   Yes Historical Provider, MD  Darbepoetin Alfa (ARANESP) 200 MCG/0.4ML SOSY injection Inject 0.4 mLs (200 mcg total) into the vein every Thursday with hemodialysis. 05/15/14  Yes Joseph Art, DO  docusate sodium (COLACE) 100 MG capsule Take 1 capsule (100 mg total)  by mouth daily. 03/28/14  Yes Starleen Arms, MD  doxercalciferol (HECTOROL) 4 MCG/2ML injection Inject 0.5 mLs (1 mcg total) into the vein Every Tuesday,Thursday,and Saturday with dialysis. 05/13/14  Yes Joseph Art, DO  glucose blood (ONETOUCH VERIO) test strip Use as instructed to check blood sugar 4 times per day dx code E13.10 02/17/14  Yes [name], MD  insulin aspart (NOVOLOG) 100 UNIT/ML injection Inject 0-15 Units into the skin 3 (three) times daily with meals. Patient taking differently: Inject 5 Units into the skin 3 (three) times daily as needed for high blood sugar (CBG >150).  03/28/14  Yes Starleen Arms, MD  insulin glargine (LANTUS) 100 UNIT/ML injection Inject 0.07 mLs (7 Units total) into the skin daily at 10 pm. Patient taking differently: Inject 10 Units into the skin daily at 10 pm.  05/13/14  Yes Joseph Art, DO  Multiple Vitamins-Minerals (DECUBI-VITE) CAPS Take 2 capsules by mouth daily.   Yes Historical Provider, MD  Nutritional Supplements (FEEDING SUPPLEMENT, NEPRO CARB STEADY,) LIQD Take 237 mLs by mouth daily at 2 PM daily at 2 PM. 05/13/14  Yes Joseph Art, DO  ONETOUCH DELICA LANCETS FINE MISC Use to check blood sugar 4 times per day 09/16/13  Yes [name], MD  oxyCODONE-acetaminophen (PERCOCET/ROXICET) 5-325 MG per tablet Take 1 tablet by mouth every 8 (eight) hours as needed for moderate pain or severe pain. 05/13/14  Yes Jessica U Vann, DO  pantoprazole (PROTONIX) 40 MG tablet Take 1 tablet (40 mg total) by mouth daily. 05/13/14  Yes Joseph Art, DO  pravastatin (PRAVACHOL) 20 MG tablet Take 20 mg by mouth daily.   Yes Historical Provider, MD  saccharomyces boulardii (FLORASTOR) 250 MG capsule Take 1 capsule (250 mg total) by mouth 2 (two) times daily. 03/28/14  Yes Starleen Arms, MD  sevelamer carbonate (RENVELA) 800 MG tablet Take 1 tablet (800 mg total) by mouth 3 (three) times daily with meals. 12/28/13  Yes Ripudeep Jenna Luo, MD  Wound Dressings  (HYDROGEL) GEL Apply 1 application topically as needed (for sacrum).   Yes Historical Provider, MD  aspirin EC 81 MG tablet Take 81 mg by mouth daily.    Historical Provider, MD  loratadine (CLARITIN) 10 MG tablet Take 10 mg by mouth daily as needed for allergies.    Historical Provider, MD   Physical Exam: Filed Vitals:   06/12/14 1530 06/12/14 1645 06/12/14 1700 06/12/14 1730  BP: 128/84 129/77 126/78 127/79  Pulse: 111 116 117 119  Temp:      TempSrc:      Resp: 21 15 14 24   Height:      Weight:      SpO2: 100% 100% 100% 100%    Wt Readings from Last 3 Encounters:  06/12/14 37.195 kg (82 lb)  06/03/14  39.463 kg (87 lb)  05/27/14 35.834 kg (79 lb)    General:  Frail elderly female lying on gurney in no acute cardiopulmonary distress. Speaking in full sentences.  Eyes: PERRLA, EOMI, normal lids, irises & conjunctiva ENT: grossly normal hearing, lips & tongue Neck: no LAD, masses or thyromegaly Cardiovascular: Tachycardic, no m/r/g. No LE edema. Telemetry: Sinus tachycardia Respiratory: CTA bilaterally, no w/r/r. Normal respiratory effort. Abdomen: soft, diffuse tenderness to palpation greater in the epigastrium, positive bowel sounds, no rebound, no guarding Skin: no rash or induration seen on limited exam Musculoskeletal: grossly normal tone BUE. Status post left BKA. Stump site wound clean dry and intact. Staples in place. No purulence noted. Psychiatric: grossly normal mood and affect, speech fluent and appropriate Neurologic: Alert and oriented 3. Cranial nerves II through XII are grossly intact. Sensation is intact. Gait not tested. No focal deficits.           Labs on Admission:  Basic Metabolic Panel:  Recent Labs Lab 06/12/14 1541  NA 136  K 5.4*  CL 101  CO2 24  GLUCOSE 172*  BUN 73*  CREATININE 4.00*  CALCIUM 8.1*   Liver Function Tests:  Recent Labs Lab 06/12/14 1541  AST 31  ALT 36*  ALKPHOS 138*  BILITOT 0.6  PROT 6.2  ALBUMIN 2.3*   No  results for input(s): LIPASE, AMYLASE in the last 168 hours. No results for input(s): AMMONIA in the last 168 hours. CBC:  Recent Labs Lab 06/12/14 1217  WBC 9.4  NEUTROABS 8.3*  HGB 14.0  HCT 44.4  MCV 83.5  PLT 168   Cardiac Enzymes: No results for input(s): CKTOTAL, CKMB, CKMBINDEX, TROPONINI in the last 168 hours.  BNP (last 3 results) No results for input(s): BNP in the last 8760 hours.  ProBNP (last 3 results) No results for input(s): PROBNP in the last 8760 hours.  CBG: No results for input(s): GLUCAP in the last 168 hours.  Radiological Exams on Admission: Dg Abd Acute W/chest  06/12/2014   CLINICAL DATA:  Acute nausea, vomiting and diarrhea.  EXAM: DG ABDOMEN ACUTE W/ 1V CHEST  COMPARISON:  May 09, 2014.  FINDINGS: There is no evidence of dilated bowel loops or free intraperitoneal air. No radiopaque calculi or other significant radiographic abnormality is seen. Stable cardiomediastinal silhouette. Right internal jugular dialysis catheter is unchanged in position. Moderate right pleural effusion is noted which is increased compared to prior exam. Mild diffuse interstitial densities are noted consistent with pulmonary edema.  IMPRESSION: No evidence of bowel obstruction or ileus. Mild bilateral pulmonary edema is noted with moderate right pleural effusion.   Electronically Signed   By: Lupita Raider, M.D.   On: 06/12/2014 13:55    EKG: Independently reviewed. Normal sinus rhythm, LVH,  Assessment/Plan Principal Problem:   Hematemesis Active Problems:   HLD (hyperlipidemia)   Anemia of chronic disease   End stage renal disease-on HD   Acute hyperkalemia   Diarrhea   Protein-calorie malnutrition, severe   DM type 2 (diabetes mellitus, type 2)   Peripheral artery disease   S/P BKA (below knee amputation) unilateral   Nausea and vomiting in adult patient   Tachycardia  #1 hematemesis/coffee-ground emesis Patient presented with a one-day history of  coffee-ground emesis. Patient on a daily aspirin. Patient denies any NSAID use. Patient with some epigastric abdominal pain. Hemoglobin currently at 14.0. Check CBCs every 8 hours. Place on PPI. Will place patient on clear liquids. GI has been consulted and patient  will be seen by Dr. Elnoria Howard. Follow.  #2 hyperkalemia Likely secondary to end-stage renal disease. Patient was post dialysis today however did not get dialysis as she had presented to the ED. Patient with no peaked T waves on EKG. Patient currently asymptomatic. Nephrology has been consulted and will be assessing the patient. Patient may go for dialysis. Follow.  #3 nausea emesis diarrhea Likely gastroenteritis. Patient states stools are more soft. Supportive care. Anti-emetics. Follow.  #4 severe protein calorie malnutrition Continue nutritional supplementation.  #5 diabetes type 2 Check a hemoglobin A1c. Place on 5 units of Lantus daily. Sliding scale insulin.  #6 status post BKA left lower extremity Stable. PT/OT. Will need to follow-up with vascular surgery as outpatient.  #7 status post BKA Stable.  #8 tachycardia Likely secondary to problem #1. Place on Lopressor 2.5 mg IV every 8 hours. Follow.  #9 hyperlipidemia Continue statin.  #10 prophylaxis PPI for GI prophylaxis. SCDs for DVT prophylaxis.  Code Status: Full DVT Prophylaxis: SCDs Family Communication: updated patient no family present. Disposition Plan: Admit to telemetry.  Time spent: 45 MINS  [facility] MD [facility] Pager [phone]

## 2014-06-12 NOTE — ED Notes (Signed)
Admitting MD at the bedside.  

## 2014-06-12 NOTE — ED Provider Notes (Signed)
CSN: [id number]     Arrival date & time 06/12/14  1151 History   First MD Initiated Contact with Patient 06/12/14 1158     Chief Complaint  Patient presents with  . Nausea     (Consider location/radiation/quality/duration/timing/severity/associated sxs/prior Treatment) HPI Complains of vomiting approximately 3 times, dark material and diarrhea 4 times since last night. No fever. Complains of diffuse abdominal pain with vomiting only no pain presently. She is presently mildly hungry. No treatment prior to coming here. No other associated symptoms. Last hemodialysis was 2 days ago. Denies chest pain. No nausea present since treatment with IV Zofran prior to my exam. Past Medical History  Diagnosis Date  . Hyperlipidemia   . Urinary retention     DX NEUROGENIC BLADDER--HAS INDWELLING FOLEY CATHETER  . Arthritis   . Anemia   . CAD (coronary artery disease)   . Neuropathic pain   . Stroke     patient not aware.  . Foley catheter in place     for urinary retention  . Critical lower limb ischemia   . Hypertension   . Peripheral vascular disease   . Hypoglycemia 08/19/2013  . Hypokalemia 08/19/2013  . Diabetes mellitus     TAKES INSULIN   . ESRD (end stage renal disease)     [address] T, TH, S   Past Surgical History  Procedure Laterality Date  . Insertion of suprapubic catheter N/A 05/16/2012    Procedure: INSERTION OF SUPRAPUBIC CATHETER;  Surgeon: Sebastian Ache, MD;  Location: [facility] ORS;  Service: Urology;  Laterality: N/A;  . Bascilic vein transposition Left 10/30/2012    Procedure: LEFT 1ST STAGE BASCILIC VEIN TRANSPOSITION;  Surgeon: Chuck Hint, MD;  Location: [facility] OR;  Service: Vascular;  Laterality: Left;  . Nm myocar perf wall motion  04/19/2012    low risk study-EF 44%,mild inferolateral hypkinesis  . Bascilic vein transposition Left 02/05/2013    Procedure: BASCILIC VEIN TRANSPOSITION 2ND STAGE;  Surgeon: Chuck Hint, MD;  Location: [facility] OR;  Service:  Vascular;  Laterality: Left;  . Radiology with anesthesia N/A 10/25/2013    Procedure: RADIOLOGY WITH ANESTHESIA;  Surgeon: Medication Radiologist, MD;  Location: [facility] OR;  Service: Radiology;  Laterality: N/A;  . Fistulogram Left 04/29/2013    Procedure: FISTULOGRAM;  Surgeon: Chuck Hint, MD;  Location: [facility] CATH LAB;  Service: Cardiovascular;  Laterality: Left;  . Angioplasty Left 04/29/2013    Procedure: ANGIOPLASTY;  Surgeon: Chuck Hint, MD;  Location: [facility] CATH LAB;  Service: Cardiovascular;  Laterality: Left;  . Abdominal aortagram N/A 03/19/2014    Procedure: ABDOMINAL Ronny Flurry;  Surgeon: Sherren Kerns, MD;  Location: [facility] CATH LAB;  Service: Cardiovascular;  Laterality: N/A;  . Left and right heart catheterization with coronary angiogram N/A 03/24/2014    Procedure: LEFT AND RIGHT HEART CATHETERIZATION WITH CORONARY ANGIOGRAM;  Surgeon: Marykay Lex, MD;  Location: [facility] CATH LAB;  Service: Cardiovascular;  Laterality: N/A;  . Endarterectomy popliteal Left 03/26/2014    Procedure: Left popliteal and peroneal artery endarterectomy with vein patch angioplasty;  Surgeon: Sherren Kerns, MD;  Location: [facility] OR;  Service: Vascular;  Laterality: Left;  . Amputation Left 03/26/2014    Procedure: AMPUTATION SECOND LEFT TOE;  Surgeon: Sherren Kerns, MD;  Location: [facility] OR;  Service: Vascular;  Laterality: Left;  . Av fistula placement Left 04/28/2014    Procedure: CREATION OF LEFT BRACHIOCEPHALIC  ARTERIOVENOUS (AV) FISTULA CREATION;  Surgeon: Fransisco Hertz, MD;  Location: [facility]  OR;  Service: Vascular;  Laterality: Left;  . Amputation Left 05/10/2014    Procedure: AMPUTATION BELOW KNEE;  Surgeon: Larina Earthly, MD;  Location: [facility] OR;  Service: Vascular;  Laterality: Left;   Family History  Problem Relation Age of Onset  . Hyperlipidemia Mother   . Hypertension Mother   . Hodgkin's lymphoma Father   . Heart disease Sister    History  Substance Use Topics  . Smoking status: Never Smoker   .  Smokeless tobacco: Never Used  . Alcohol Use: No   OB History    No data available     Review of Systems  Constitutional: Negative.   HENT: Negative.   Respiratory: Negative.   Cardiovascular: Negative.   Gastrointestinal: Negative.   Genitourinary:       Chronic indwelling Foley catheter. Receives hemodialysis Tuesdays Thursdays Saturdays  Musculoskeletal: Negative.        Right-sided BKA  Skin: Negative.   Neurological: Negative.   Psychiatric/Behavioral: Negative.   All other systems reviewed and are negative.     Allergies  Review of patient's allergies indicates no known allergies.  Home Medications   Prior to Admission medications   Medication Sig Start Date End Date Taking? Authorizing Provider  acetaminophen-codeine (TYLENOL #3) 300-30 MG per tablet Take 1 tablet by mouth every 6 (six) hours as needed for moderate pain. 05/13/14   Joseph Art, DO  Amino Acids-Protein Hydrolys (FEEDING SUPPLEMENT, PRO-STAT SUGAR FREE 64,) LIQD Take 30 mLs by mouth 3 (three) times daily with meals.    Historical Provider, MD  aspirin EC 81 MG tablet Take 81 mg by mouth daily.    Historical Provider, MD  carvedilol (COREG) 6.25 MG tablet take 1 tablet by mouth twice a day 04/17/14   Chrystie Nose, MD  Darbepoetin Alfa (ARANESP) 200 MCG/0.4ML SOSY injection Inject 0.4 mLs (200 mcg total) into the vein every Thursday with hemodialysis. 05/15/14   Joseph Art, DO  docusate sodium (COLACE) 100 MG capsule Take 1 capsule (100 mg total) by mouth daily. Patient not taking: Reported on 06/03/2014 03/28/14   Starleen Arms, MD  doxercalciferol (HECTOROL) 4 MCG/2ML injection Inject 0.5 mLs (1 mcg total) into the vein Every Tuesday,Thursday,and Saturday with dialysis. 05/13/14   Joseph Art, DO  glucose blood (ONETOUCH VERIO) test strip Use as instructed to check blood sugar 4 times per day dx code E13.10 02/17/14   [name], MD  insulin aspart (NOVOLOG) 100 UNIT/ML injection Inject 0-15  Units into the skin 3 (three) times daily with meals. Patient taking differently: Inject 3 Units into the skin 3 (three) times daily as needed for high blood sugar (CBG >150).  03/28/14   Leana Roe Elgergawy, MD  insulin glargine (LANTUS) 100 UNIT/ML injection Inject 0.07 mLs (7 Units total) into the skin daily at 10 pm. 05/13/14   Joseph Art, DO  loratadine (CLARITIN) 10 MG tablet Take 10 mg by mouth daily as needed for allergies.    Historical Provider, MD  Multiple Vitamins-Minerals (DECUBI-VITE) CAPS Take 2 capsules by mouth daily.    Historical Provider, MD  Nutritional Supplements (FEEDING SUPPLEMENT, NEPRO CARB STEADY,) LIQD Take 237 mLs by mouth daily at 2 PM daily at 2 PM. 05/13/14   Joseph Art, DO  ONETOUCH DELICA LANCETS FINE MISC Use to check blood sugar 4 times per day 09/16/13   [name], MD  oxyCODONE-acetaminophen (PERCOCET/ROXICET) 5-325 MG per tablet Take 1 tablet by mouth every 8 (eight)  hours as needed for moderate pain or severe pain. 05/13/14   Joseph Art, DO  pantoprazole (PROTONIX) 40 MG tablet Take 1 tablet (40 mg total) by mouth daily. 05/13/14   Joseph Art, DO  pravastatin (PRAVACHOL) 20 MG tablet Take 20 mg by mouth daily.    Historical Provider, MD  saccharomyces boulardii (FLORASTOR) 250 MG capsule Take 1 capsule (250 mg total) by mouth 2 (two) times daily. 03/28/14   Starleen Arms, MD  sevelamer carbonate (RENVELA) 800 MG tablet Take 1 tablet (800 mg total) by mouth 3 (three) times daily with meals. 12/28/13   Ripudeep K Rai, MD   BP 138/77 mmHg  Pulse 110  Temp(Src) 97.8 F (36.6 C) (Oral)  Resp 21  Ht 5\' 3"  (1.6 m)  Wt 82 lb (37.195 kg)  BMI 14.53 kg/m2  SpO2 100% Physical Exam  Constitutional: She appears well-developed and well-nourished. No distress.  Chronically ill appearing alert appropriate  HENT:  Head: Normocephalic and atraumatic.  Eyes: Conjunctivae are normal. Pupils are equal, round, and reactive to light.  Neck: Neck supple. No  tracheal deviation present. No thyromegaly present.  Cardiovascular: Regular rhythm.   No murmur heard. Mildly tachycardic  Pulmonary/Chest: Effort normal and breath sounds normal.  Abdominal: Soft. Bowel sounds are normal. She exhibits no distension and no mass. There is tenderness. There is no rebound and no guarding.  Mild tenderness across epigastric area  Genitourinary: Guaiac negative stool.  External hemorrhoid Hemoccult negative. Small presacral decubitus ulcer  Musculoskeletal: Normal range of motion. She exhibits no edema or tenderness.  Right-sided BKA with sutured clean-appearing surgical wound at distal stump  Neurological: She is alert. Coordination normal.  Skin: Skin is warm and dry. No rash noted.  Psychiatric: She has a normal mood and affect.  Nursing note and vitals reviewed.  Results for orders placed or performed during the hospital encounter of 06/12/14  CBC with Differential  Result Value Ref Range   WBC 9.4 4.0 - 10.5 K/uL   RBC 5.32 (H) 3.87 - 5.11 MIL/uL   Hemoglobin 14.0 12.0 - 15.0 g/dL   HCT 71.0 62.6 - 94.8 %   MCV 83.5 78.0 - 100.0 fL   MCH 26.3 26.0 - 34.0 pg   MCHC 31.5 30.0 - 36.0 g/dL   RDW 54.6 (H) 27.0 - 35.0 %   Platelets 168 150 - 400 K/uL   Neutrophils Relative % 88 (H) 43 - 77 %   Neutro Abs 8.3 (H) 1.7 - 7.7 K/uL   Lymphocytes Relative 6 (L) 12 - 46 %   Lymphs Abs 0.6 (L) 0.7 - 4.0 K/uL   Monocytes Relative 5 3 - 12 %   Monocytes Absolute 0.5 0.1 - 1.0 K/uL   Eosinophils Relative 1 0 - 5 %   Eosinophils Absolute 0.1 0.0 - 0.7 K/uL   Basophils Relative 0 0 - 1 %   Basophils Absolute 0.0 0.0 - 0.1 K/uL  Urinalysis, Routine w reflex microscopic  Result Value Ref Range   Color, Urine YELLOW YELLOW   APPearance TURBID (A) CLEAR   Specific Gravity, Urine 1.013 1.005 - 1.030   pH 7.0 5.0 - 8.0   Glucose, UA NEGATIVE NEGATIVE mg/dL   Hgb urine dipstick MODERATE (A) NEGATIVE   Bilirubin Urine NEGATIVE NEGATIVE   Ketones, ur NEGATIVE  NEGATIVE mg/dL   Protein, ur 093 (A) NEGATIVE mg/dL   Urobilinogen, UA 0.2 0.0 - 1.0 mg/dL   Nitrite NEGATIVE NEGATIVE   Leukocytes, UA LARGE (  A) NEGATIVE  Comprehensive metabolic panel  Result Value Ref Range   Sodium 136 135 - 145 mmol/L   Potassium 5.4 (H) 3.5 - 5.1 mmol/L   Chloride 101 96 - 112 mmol/L   CO2 24 19 - 32 mmol/L   Glucose, Bld 172 (H) 70 - 99 mg/dL   BUN 73 (H) 6 - 23 mg/dL   Creatinine, Ser 1.61 (H) 0.50 - 1.10 mg/dL   Calcium 8.1 (L) 8.4 - 10.5 mg/dL   Total Protein 6.2 6.0 - 8.3 g/dL   Albumin 2.3 (L) 3.5 - 5.2 g/dL   AST 31 0 - 37 U/L   ALT 36 (H) 0 - 35 U/L   Alkaline Phosphatase 138 (H) 39 - 117 U/L   Total Bilirubin 0.6 0.3 - 1.2 mg/dL   GFR calc non Af Amer 10 (L) >90 mL/min   GFR calc Af Amer 12 (L) >90 mL/min   Anion gap 11 5 - 15  Occult bld gastric/duodenum (cup to lab)  Result Value Ref Range   pH, Gastric NOT DONE    Occult Blood, Gastric POSITIVE (A) NEGATIVE  Urine microscopic-add on  Result Value Ref Range   Squamous Epithelial / LPF FEW (A) RARE   WBC, UA TOO NUMEROUS TO COUNT <3 WBC/hpf   RBC / HPF 3-6 <3 RBC/hpf   Bacteria, UA MANY (A) RARE   Casts GRANULAR CAST (A) NEGATIVE   Urine-Other MANY YEAST   POC occult blood, ED Provider will collect  Result Value Ref Range   Fecal Occult Bld NEGATIVE NEGATIVE   Dg Abd Acute W/chest  06/12/2014   CLINICAL DATA:  Acute nausea, vomiting and diarrhea.  EXAM: DG ABDOMEN ACUTE W/ 1V CHEST  COMPARISON:  May 09, 2014.  FINDINGS: There is no evidence of dilated bowel loops or free intraperitoneal air. No radiopaque calculi or other significant radiographic abnormality is seen. Stable cardiomediastinal silhouette. Right internal jugular dialysis catheter is unchanged in position. Moderate right pleural effusion is noted which is increased compared to prior exam. Mild diffuse interstitial densities are noted consistent with pulmonary edema.  IMPRESSION: No evidence of bowel obstruction or ileus. Mild  bilateral pulmonary edema is noted with moderate right pleural effusion.   Electronically Signed   By: Lupita Raider, M.D.   On: 06/12/2014 13:55     ED Course  Procedures (including critical care time) Labs Review Labs Reviewed  CBC WITH DIFFERENTIAL/PLATELET  COMPREHENSIVE METABOLIC PANEL  URINALYSIS, ROUTINE W REFLEX MICROSCOPIC    Imaging Review No results found.   EKG Interpretation None      MDM  Clinically patient is not in congestive heart failure she is lying flat and denies dyspnea. I did speak with Dr.Schertz will arrange for hemodialysis. Dr.Hung gastroenterology service will consult on patient I also spoke with Dr. Janee Morn from hospital service plan admit medical surgical floor,. Will send urine for culture. No definite uti as pt with indwelling forey catheter, no fever, no leukocytosis Clear liquid diet Diagnoses #1hematemesis #2 mild hyperkalemia Final diagnoses:  None        Doug Sou, MD 06/12/14 1705

## 2014-06-13 ENCOUNTER — Inpatient Hospital Stay (HOSPITAL_COMMUNITY): Payer: Medicare Other | Admitting: Certified Registered"

## 2014-06-13 ENCOUNTER — Encounter (HOSPITAL_COMMUNITY)
Admission: EM | Disposition: A | Discharge status: Skilled Nursing Facility | Payer: Self-pay | Source: Home / Self Care | Attending: Internal Medicine

## 2014-06-13 ENCOUNTER — Encounter: Payer: Medicare Other | Admitting: Vascular Surgery

## 2014-06-13 ENCOUNTER — Encounter (HOSPITAL_COMMUNITY): Payer: Self-pay

## 2014-06-13 DIAGNOSIS — R197 Diarrhea, unspecified: Secondary | ICD-10-CM

## 2014-06-13 DIAGNOSIS — D638 Anemia in other chronic diseases classified elsewhere: Secondary | ICD-10-CM

## 2014-06-13 HISTORY — PX: ESOPHAGOGASTRODUODENOSCOPY: SHX5428

## 2014-06-13 LAB — BASIC METABOLIC PANEL WITH GFR
Anion gap: 13 (ref 5–15)
BUN: 85 mg/dL — ABNORMAL HIGH (ref 6–23)
CO2: 24 mmol/L (ref 19–32)
Calcium: 8.5 mg/dL (ref 8.4–10.5)
Chloride: 99 mmol/L (ref 96–112)
Creatinine, Ser: 4.72 mg/dL — ABNORMAL HIGH (ref 0.50–1.10)
GFR calc Af Amer: 10 mL/min — ABNORMAL LOW (ref >90)
GFR calc non Af Amer: 9 mL/min — ABNORMAL LOW (ref >90)
Glucose, Bld: 245 mg/dL — ABNORMAL HIGH (ref 70–99)
Potassium: 5.4 mmol/L — ABNORMAL HIGH (ref 3.5–5.1)
Sodium: 136 mmol/L (ref 135–145)

## 2014-06-13 LAB — HEPATIC FUNCTION PANEL
ALT: 33 U/L (ref 0–35)
AST: 28 U/L (ref 0–37)
Albumin: 2.2 g/dL — ABNORMAL LOW (ref 3.5–5.2)
Alkaline Phosphatase: 132 U/L — ABNORMAL HIGH (ref 39–117)
Bilirubin, Direct: <0.1 mg/dL (ref 0.0–0.5)
Total Bilirubin: 0.4 mg/dL (ref 0.3–1.2)
Total Protein: 6.4 g/dL (ref 6.0–8.3)

## 2014-06-13 LAB — CBC
HCT: 37 % (ref 36.0–46.0)
HCT: 39.6 % (ref 36.0–46.0)
HEMATOCRIT: 36.5 % (ref 36.0–46.0)
Hemoglobin: 11.7 g/dL — ABNORMAL LOW (ref 12.0–15.0)
Hemoglobin: 11.6 g/dL — ABNORMAL LOW (ref 12.0–15.0)
Hemoglobin: 12.6 g/dL (ref 12.0–15.0)
MCH: 26 pg (ref 26.0–34.0)
MCH: 25.8 pg — ABNORMAL LOW (ref 26.0–34.0)
MCH: 26.4 pg (ref 26.0–34.0)
MCHC: 31.6 g/dL (ref 30.0–36.0)
MCHC: 31.8 g/dL (ref 30.0–36.0)
MCHC: 31.8 g/dL (ref 30.0–36.0)
MCV: 82.2 fL (ref 78.0–100.0)
MCV: 81.1 fL (ref 78.0–100.0)
MCV: 82.8 fL (ref 78.0–100.0)
PLATELETS: 141 10*3/uL — AB (ref 150–400)
Platelets: 151 10*3/uL (ref 150–400)
Platelets: UNDETERMINED K/uL (ref 150–400)
RBC: 4.5 MIL/uL (ref 3.87–5.11)
RBC: 4.5 MIL/uL (ref 3.87–5.11)
RBC: 4.78 MIL/uL (ref 3.87–5.11)
RDW: 18.7 % — AB (ref 11.5–15.5)
RDW: 18.6 % — AB (ref 11.5–15.5)
RDW: 18.8 % — ABNORMAL HIGH (ref 11.5–15.5)
WBC: 9.2 10*3/uL (ref 4.0–10.5)
WBC: 7.1 10*3/uL (ref 4.0–10.5)
WBC: 5.6 K/uL (ref 4.0–10.5)

## 2014-06-13 LAB — URINE CULTURE

## 2014-06-13 LAB — GLUCOSE, CAPILLARY
GLUCOSE-CAPILLARY: 171 mg/dL — AB (ref 70–99)
Glucose-Capillary: 182 mg/dL — ABNORMAL HIGH (ref 70–99)
Glucose-Capillary: 130 mg/dL — ABNORMAL HIGH (ref 70–99)

## 2014-06-13 LAB — HEMOGLOBIN A1C
Hgb A1c MFr Bld: 6.5 % — ABNORMAL HIGH (ref 4.8–5.6)
Mean Plasma Glucose: 140 mg/dL

## 2014-06-13 LAB — PROTIME-INR
INR: 1.15 (ref 0.00–1.49)
Prothrombin Time: 14.8 s (ref 11.6–15.2)

## 2014-06-13 LAB — MAGNESIUM: MAGNESIUM: 2.2 mg/dL (ref 1.5–2.5)

## 2014-06-13 SURGERY — EGD (ESOPHAGOGASTRODUODENOSCOPY)
Anesthesia: Monitor Anesthesia Care

## 2014-06-13 MED ORDER — SODIUM CHLORIDE 0.9 % IV SOLN
INTRAVENOUS | Status: DC
  Administered 2014-06-13: 11:00:00 via INTRAVENOUS

## 2014-06-13 MED ORDER — PHENYLEPHRINE HCL 10 MG/ML IJ SOLN
INTRAMUSCULAR | Status: DC | PRN
  Administered 2014-06-13 (×2): 40 ug via INTRAVENOUS

## 2014-06-13 MED ORDER — PROPOFOL 10 MG/ML IV BOLUS
INTRAVENOUS | Status: DC | PRN
  Administered 2014-06-13 (×2): 10 mg via INTRAVENOUS
  Administered 2014-06-13 (×2): 20 mg via INTRAVENOUS
  Administered 2014-06-13: 10 mg via INTRAVENOUS

## 2014-06-13 MED ORDER — DOXERCALCIFEROL 4 MCG/2ML IV SOLN
INTRAVENOUS | Status: AC
  Administered 2014-06-13: 4 ug
  Filled 2014-06-13: qty 2

## 2014-06-13 MED ORDER — DARBEPOETIN ALFA 200 MCG/0.4ML IJ SOSY
PREFILLED_SYRINGE | INTRAMUSCULAR | Status: AC
  Administered 2014-06-13: 200 ug
  Filled 2014-06-13: qty 0.4

## 2014-06-13 MED ORDER — LACTATED RINGERS IV SOLN: INTRAVENOUS | Status: DC

## 2014-06-13 NOTE — Procedures (Signed)
I was present at this session.  I have reviewed the session itself and made appropriate changes.  HD via L arm AVF.  bp 80-100  Maudean Hoffmann L 4/22/20164:15 PM

## 2014-06-13 NOTE — Anesthesia Procedure Notes (Signed)
Procedure Name: MAC Date/Time: 06/13/2014 12:10 PM Performed by: De Nurse Pre-anesthesia Checklist: Patient identified, Emergency Drugs available, Suction available, Patient being monitored and Timeout performed Patient Re-evaluated:Patient Re-evaluated prior to inductionOxygen Delivery Method: Nasal cannula Preoxygenation: Pre-oxygenation with 100% oxygen

## 2014-06-13 NOTE — Anesthesia Preprocedure Evaluation (Addendum)
Anesthesia Evaluation  Patient identified by MRN, date of birth, ID band Patient awake    Reviewed: Allergy & Precautions, NPO status , Patient's Chart, lab work & pertinent test results  History of Anesthesia Complications (+) DIFFICULT AIRWAY  Airway Mallampati: I  TM Distance: >3 FB Neck ROM: Full    Dental  (+) Missing, Poor Dentition, Partial Upper, Partial Lower, Dental Advisory Given   Pulmonary    Pulmonary exam normal       Cardiovascular hypertension, Pt. on medications + CAD     Neuro/Psych    GI/Hepatic   Endo/Other  diabetes, Type 2, Insulin Dependent  Renal/GU ESRF and DialysisRenal disease     Musculoskeletal   Abdominal   Peds  Hematology   Anesthesia Other Findings   Reproductive/Obstetrics                            Anesthesia Physical Anesthesia Plan  ASA: III  Anesthesia Plan: MAC   Post-op Pain Management:    Induction: Intravenous  Airway Management Planned: Natural Airway  Additional Equipment:   Intra-op Plan:   Post-operative Plan:   Informed Consent: I have reviewed the patients History and Physical, chart, labs and discussed the procedure including the risks, benefits and alternatives for the proposed anesthesia with the patient or authorized representative who has indicated his/her understanding and acceptance.     Plan Discussed with: CRNA and Surgeon  Anesthesia Plan Comments:         Anesthesia Quick Evaluation

## 2014-06-13 NOTE — Progress Notes (Signed)
[facility]                                                                              Patient Demographics  Elizabeth [name], is a 69 y.o. female, DOB - 1945-10-01, ZOX:[id number]  Admit date - 06/12/2014   Admitting Physician Rodolph Bong, MD  Outpatient Primary MD for the patient is [name], MD  LOS - 1   Chief Complaint  Patient presents with  . Nausea       Brief HPI   The patient is a 69 year old female with ESRD on hemodialysis TTS, PVD status post left BKA 05/10/2014, urinary retention with indwelling Foley catheter, cad, HTN, insulin-dependent DM who was at rehabilitation at [place] living presented to the ED with a one-day history of nausea and coffee-ground emesis, diarrhea, symptoms started at night before the admission. Patient reports taking aspirin but no NSAIDs. Patient denied any prior history of peptic ulcer disease. Patient reported some abdominal pain associated with emesis per patient. Patient reported intermittent dysuria ongoing for months that comes and goes at this site of catheter insertion per patient. Patient denied any hematochezia or melena.  Patient was seen in the emergency room noted to be slightly tachycardic. Gastric occult was positive. Comprehensive metabolic profile with a potassium of 5.4 BUN of 73 creatinine of 4.00 for status of 138 albumin of 2.3 ALT of 36 otherwise was within normal limits. Urinalysis was turbid, nitrite negative, large leukocytes, many yeast, too numerous to count WBCs, 3-6 RBCs, many bacteria, granular casts. CBC done had a white count of 9.4 hemoglobin of 14 platelet count of 168 otherwise was within normal limits. Acute abdominal series which was done showed no evidence of bowel obstruction or ileus. Mild bilateral pulmonary edema was noted with moderate right pleural effusion. Patient was given PPI IV 1. Nephrology was consulted. GI was also consulted per ED physician.    Assessment & Plan     Principal Problem:  hematemesis/coffee-ground emesis - Continue to hold aspirin, denies any NSAID use  - Hemoglobin stable at 11.7 today, GI consulted, planning endoscopy today, continue clear liquid diet, serial H&H e  ESRD on HD, TTS with hyperkalemia Likely secondary to end-stage renal disease. Patient was supposed to be a dialysis on the day of admission, nephrology consulted, likely will have dialysis after EGD.   Nausea or vomiting and diarrhea likely due to gastroenteritis. Improving, continue supportive care, IV fluids discontinued  severe protein calorie malnutrition Continue nutritional supplementation.   diabetes type 2 Check a hemoglobin A1c. - Continue 5 units of Lantus daily. Sliding scale insulin.   status post BKA left lower extremity Stable. PT/OT, follow-up patient with [place] for surgery.  tachycardia likely due to #1 Continue beta blocker.    hyperlipidemia Continue statin.  Code Status: Full code  Family Communication: Discussed in detail with the patient, all imaging results, lab results explained to the patient    Disposition Plan: pending GI evaluation  Time Spent in minutes   25 minutes  Procedures  EGD  Consults   Gastroenterology  DVT Prophylaxis SCD's  Medications  Scheduled Meds: . [START ON 06/19/2014] Darbepoetin Alfa  200 mcg Intravenous Q Thu-HD  . docusate sodium  100 mg Oral BID  . [START ON 06/14/2014] doxercalciferol  1 mcg Intravenous Q T,Th,Sa-HD  . feeding supplement (NEPRO CARB STEADY)  237 mL Oral Q1400  . feeding supplement (PRO-STAT SUGAR FREE 64)  30 mL Oral TID WC  . insulin aspart  0-9 Units Subcutaneous TID WC  . insulin glargine  5 Units Subcutaneous Daily  . metoprolol  2.5 mg Intravenous 3 times per day  . multivitamin  1 tablet Oral QHS  . pantoprazole (PROTONIX) IV  40 mg Intravenous Q12H  . pravastatin  20 mg Oral q1800  . saccharomyces boulardii  250 mg Oral BID  . sevelamer carbonate  800 mg Oral TID WC   . sodium chloride  3 mL Intravenous Q12H  . sodium chloride  3 mL Intravenous Q12H   Continuous Infusions: . sodium chloride 20 mL/hr at 06/13/14 1123  . lactated ringers     PRN Meds:.sodium chloride, acetaminophen **OR** acetaminophen, acetaminophen-codeine, albuterol, HYDROcodone-acetaminophen, ipratropium, loratadine, ondansetron **OR** ondansetron (ZOFRAN) IV, oxyCODONE-acetaminophen, sodium chloride, sorbitol, zolpidem   Antibiotics   Anti-infectives    None        Subjective:   Elizabeth [name] was seen and examined today.  Patient denies dizziness, chest pain, shortness of breath, abdominal pain, N/V/D/C, new weakness, numbess, tingling. No acute events overnight.    Objective:   Blood pressure 92/53, pulse 103, temperature 97.7 F (36.5 C), temperature source Oral, resp. rate 17, height  (1.6 m), weight 40.7 kg (89 lb 11.6 oz), SpO2 99 %.  Wt Readings from Last 3 Encounters:  06/13/14 40.7 kg (89 lb 11.6 oz)  06/03/14 39.463 kg (87 lb)  05/27/14 35.834 kg (79 lb)     Intake/Output Summary (Last 24 hours) at 06/13/14 1248 Last data filed at 06/13/14 1221  Gross per 24 hour  Intake    600 ml  Output      0 ml  Net    600 ml    Exam  General: Alert and oriented x 3, NAD  HEENT:  PERRLA, EOMI, Anicteic Sclera, mucous membranes moist.   Neck: Supple, no JVD, no masses  CVS: S1 S2 auscultated, no rubs, murmurs or gallops. Regular rate and rhythm.  Respiratory: Clear to auscultation bilaterally, no wheezing, rales or rhonchi  Abdomen: Soft, nontender, nondistended, + bowel sounds  Ext: no cyanosis clubbing or edema, left BKA   Neuro: AAOx3, Cr N's II- XII.   Skin: No rashes  Psych: Normal affect and demeanor, alert and oriented x3    Data Review   Micro Results Recent Results (from the past 240 hour(s))  MRSA PCR Screening     Status: None   Collection Time: 06/12/14  7:16 PM  Result Value Ref Range Status   MRSA by PCR NEGATIVE  NEGATIVE Final    Comment:        The GeneXpert MRSA Assay (FDA approved for NASAL specimens only), is one component of a comprehensive MRSA colonization surveillance program. It is not intended to diagnose MRSA infection nor to guide or monitor treatment for MRSA infections.     Radiology Reports Dg Abd Acute W/chest  06/12/2014   CLINICAL DATA:  Acute nausea, vomiting and diarrhea.  EXAM: DG ABDOMEN ACUTE W/ 1V CHEST  COMPARISON:  May 09, 2014.  FINDINGS: There is no evidence of dilated bowel loops or free intraperitoneal air. No radiopaque calculi or other significant radiographic abnormality is seen. Stable cardiomediastinal silhouette. Right  internal jugular dialysis catheter is unchanged in position. Moderate right pleural effusion is noted which is increased compared to prior exam. Mild diffuse interstitial densities are noted consistent with pulmonary edema.  IMPRESSION: No evidence of bowel obstruction or ileus. Mild bilateral pulmonary edema is noted with moderate right pleural effusion.   Electronically Signed   By: Lupita Raider, M.D.   On: 06/12/2014 13:55    CBC  Recent Labs Lab 06/12/14 1217 06/12/14 2143 06/13/14 0531  WBC 9.4 14.3* 9.2  HGB 14.0 12.1 11.7*  HCT 44.4 39.0 37.0  PLT 168 166 141*  MCV 83.5 82.8 82.2  MCH 26.3 25.7* 26.0  MCHC 31.5 31.0 31.6  RDW 19.0* 18.5* 18.7*  LYMPHSABS 0.6*  --   --   MONOABS 0.5  --   --   EOSABS 0.1  --   --   BASOSABS 0.0  --   --     Chemistries   Recent Labs Lab 06/12/14 1541 06/13/14 0531  NA 136 136  K 5.4* 5.4*  CL 101 99  CO2 24 24  GLUCOSE 172* 245*  BUN 73* 85*  CREATININE 4.00* 4.72*  CALCIUM 8.1* 8.5  MG  --  2.2  AST 31 28  ALT 36* 33  ALKPHOS 138* 132*  BILITOT 0.6 0.4   ------------------------------------------------------------------------------------------------------------------ estimated creatinine clearance is 7.1 mL/min (by C-G formula based on Cr of  4.72). ------------------------------------------------------------------------------------------------------------------  Recent Labs  06/12/14 1217  HGBA1C 6.5*   ------------------------------------------------------------------------------------------------------------------ No results for input(s): CHOL, HDL, LDLCALC, TRIG, CHOLHDL, LDLDIRECT in the last 72 hours. ------------------------------------------------------------------------------------------------------------------ No results for input(s): TSH, T4TOTAL, T3FREE, THYROIDAB in the last 72 hours.  Invalid input(s): FREET3 ------------------------------------------------------------------------------------------------------------------ No results for input(s): VITAMINB12, FOLATE, FERRITIN, TIBC, IRON, RETICCTPCT in the last 72 hours.  Coagulation profile  Recent Labs Lab 06/12/14 2143  INR 1.15    No results for input(s): DDIMER in the last 72 hours.  Cardiac Enzymes No results for input(s): CKMB, TROPONINI, MYOGLOBIN in the last 168 hours.  Invalid input(s): CK ------------------------------------------------------------------------------------------------------------------ Invalid input(s): POCBNP   Recent Labs  06/12/14 2134 06/13/14 0736  GLUCAP 162* 182*     RAI,RIPUDEEP M.D. [facility] 06/13/2014, 12:48 PM  Pager: [phone]   Between 7am to 7pm - call Pager - [phone]  After 7pm go to [email or web address] - password [email or web address]  Call night coverage person covering after 7pm

## 2014-06-13 NOTE — Anesthesia Postprocedure Evaluation (Signed)
Anesthesia Post Note  Patient: Elizabeth [name]  Procedure(s) Performed: Procedure(s) (LRB): ESOPHAGOGASTRODUODENOSCOPY (EGD) (N/A)  Anesthesia type: general  Patient location: PACU  Post pain: Pain level controlled  Post assessment: Patient's Cardiovascular Status Stable  Last Vitals:  Filed Vitals:   06/13/14 1240  BP: 92/53  Pulse: 103  Temp:   Resp: 17    Post vital signs: Reviewed and stable  Level of consciousness: sedated  Complications: No apparent anesthesia complications

## 2014-06-13 NOTE — Progress Notes (Signed)
Chaplain responded to consult, pt not in room.  Chaplain to follow up.  Erroll Luna, Chaplain 06/13/2014 10:57 AM

## 2014-06-13 NOTE — Op Note (Signed)
[facility] [address] [place], [address]   ENDOSCOPY PROCEDURE REPORT  PATIENT: Elizabeth, [name]  MR#: [id number] BIRTHDATE: 1945-11-23 , 69  yrs. old GENDER: female ENDOSCOPIST:Idell Hissong Elnoria Howard, MD REFERRED BY: PROCEDURE DATE:  2014/07/10 PROCEDURE:   EGD, diagnostic ASA CLASS:    Class III INDICATIONS: hematemesis. MEDICATION: Monitored anesthesia care TOPICAL ANESTHETIC:   none  DESCRIPTION OF PROCEDURE:   After the risks and benefits of the procedure were explained, informed consent was obtained.  The Pentax Gastroscope [id number]  endoscope was introduced through the mouth  and advanced to the second portion of the duodenum .  The instrument was slowly withdrawn as the mucosa was fully examined. Estimated blood loss is zero unless otherwise noted in this procedure report.   FINDINGS: The proximal and mid esophagus were dilated.  The Z-line was sharp and subjectively it was narrowed. The endoscope was able to pass through without any difficulty.  Retroflexion did not reveal any masses to suggest an extrinsic compression.  No other abnormalities were noted in the gastric lumen or the duodenum. Retroflexed views revealed as previously described and Retroflexed views revealed .    The scope was then withdrawn from the patient and the procedure completed.  COMPLICATIONS: There were no immediate complications.  ENDOSCOPIC IMPRESSION: 1) ? Achalasia. RECOMMENDATIONS: 1) Esophagram. _______________________________ eSignedJeani Hawking, MD Jul 10, 2014 12:32 PM    cc:  CPT CODES: ICD CODES:  The ICD and CPT codes recommended by this software are interpretations from the data that the clinical staff has captured with the software.  The verification of the translation of this report to the ICD and CPT codes and modifiers is the sole responsibility of the health care institution and practicing physician where this report was generated.  [employer] will not be held responsible for the validity of the ICD and CPT codes included on this report.  AMA assumes no liability for data contained or not contained herein. CPT is a Publishing rights manager of the [employer].

## 2014-06-13 NOTE — Progress Notes (Signed)
Nursing just reported that she is in dialysis.  As a result the esophagram cannot be performed today.  I will order this test as an outpatient.  This was an incidental finding on the EGD and she denies any dysphagia symptoms.  It can help to explain her epigastric pain.  I will arrange for her follow up.

## 2014-06-13 NOTE — Progress Notes (Signed)
OT Cancellation Note  Patient Details Name: Elizabeth [name] MRN: [id number] DOB: 23-Jul-1945   Cancelled Treatment:    Reason Eval/Treat Not Completed: Other (comment). Pt off floor for EGD. Pt has Medicare and current D/C plan is SNF (from [facility]). No apparent immediate acute care OT needs, therefore will defer OT to SNF. If OT eval is needed please call Acute Rehab Dept. at [phone] or text page OT at [phone].    Nena Jordan M 06/13/2014, 11:59 AM

## 2014-06-13 NOTE — Interval H&P Note (Signed)
History and Physical Interval Note:  06/13/2014 11:49 AM  Elizabeth [name]  has presented today for surgery, with the diagnosis of Coffee-ground emesis.  The various methods of treatment have been discussed with the patient and family. After consideration of risks, benefits and other options for treatment, the patient has consented to  Procedure(s): ESOPHAGOGASTRODUODENOSCOPY (EGD) (N/A) as a surgical intervention .  The patient's history has been reviewed, patient examined, no change in status, stable for surgery.  I have reviewed the patient's chart and labs.  Questions were answered to the patient's satisfaction.     Ryland Tungate D

## 2014-06-13 NOTE — Progress Notes (Signed)
   Daily Progress Note  Will check on maturation of L BC AVF tomorrow.  Would get L access duplex also to check size and depth of fistula.  Leonides Sake, MD [facility] Office: [phone] Pager: [phone]  06/13/2014, 3:42 PM

## 2014-06-13 NOTE — Progress Notes (Signed)
INITIAL NUTRITION ASSESSMENT  DOCUMENTATION CODES Per approved criteria  -Severe malnutrition in the context of chronic illness -Underweight   INTERVENTION: Continue Nepro Shake po daily, each supplement provides 425 kcal and 19 grams protein  30 ml Prostat TID  NUTRITION DIAGNOSIS: Malnutrition related to chronic illness as evidenced by severe fat and muscle depletion.   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   Per pt she has been taking her Nepro and Prostat and is willing to consume these once diet advanced. Reports diarrhea and nausea new for her.   Nutrition Focused Physical Exam:  Subcutaneous Fat:  Orbital Region: severe depletion Upper Arm Region: severe depletion Thoracic and Lumbar Region: severe depletion  Muscle:  Temple Region: severe depletion Clavicle Bone Region: severe depletion Clavicle and Acromion Bone Region: severe depletion Scapular Bone Region: severe depletion Dorsal Hand: severe depletion Patellar Region: severe depletion Anterior Thigh Region: severe depletion Posterior Calf Region: severe depletion  Edema: not present    Labs reviewed: Potassium, BUN/Cr elevated CBG's: 182-245  Height: Ht Readings from Last 1 Encounters:  06/12/14 5\' 3"  (1.6 m)    Weight: Wt Readings from Last 1 Encounters:  06/13/14 89 lb 11.6 oz (40.7 kg)    Ideal Body Weight: 49 kg   % Ideal Body Weight: 83%  Wt Readings from Last 10 Encounters:  06/13/14 89 lb 11.6 oz (40.7 kg)  06/03/14 87 lb (39.463 kg)  05/27/14 79 lb (35.834 kg)  05/13/14 74 lb 1.2 oz (33.6 kg)  05/08/14 81 lb  (36.741 kg)  04/28/14 84 lb (38.102 kg)  04/18/14 84 lb (38.102 kg)  04/09/14 84 lb (38.102 kg)  04/02/14 84 lb (38.102 kg)  03/28/14 85 lb 8.6 oz (38.8 kg)    Usual Body Weight: 80-89 lb   % Usual Body Weight: 100%  BMI:  Body mass index is 15.9 kg/(m^2).  Estimated Nutritional Needs: Kcal: 1450-1750 Protein: 65-80 grams Fluid: 1.2 L/day  Skin:  Stage III sacrum   Diet Order: Diet NPO time specified  EDUCATION NEEDS: -No education needs identified at this time   Intake/Output Summary (Last 24 hours) at 06/13/14 1119 Last data filed at 06/12/14 1400  Gross per 24 hour  Intake    500 ml  Output      0 ml  Net    500 ml    Last BM: PTA   Labs:   Recent Labs Lab 06/12/14 1541 06/13/14 0531  NA 136 136  K 5.4* 5.4*  CL 101 99  CO2 24 24  BUN 73* 85*  CREATININE 4.00* 4.72*  CALCIUM 8.1* 8.5  MG  --  2.2  GLUCOSE 172* 245*    CBG (last 3)   Recent Labs  06/12/14 2134 06/13/14 0736  GLUCAP 162* 182*    Scheduled Meds: . [START ON 06/19/2014] Darbepoetin Alfa  200 mcg Intravenous Q Thu-HD  . docusate sodium  100 mg Oral BID  . [START ON 06/14/2014] doxercalciferol  1 mcg Intravenous Q T,Th,Sa-HD  . feeding supplement (NEPRO CARB STEADY)  237 mL Oral Q1400  . feeding supplement (PRO-STAT SUGAR FREE 64)  30 mL Oral TID WC  . insulin aspart  0-9 Units Subcutaneous TID WC  . insulin glargine  5 Units Subcutaneous Daily  . metoprolol  2.5 mg Intravenous 3 times per day  . multivitamin  1 tablet Oral QHS  . pantoprazole (PROTONIX) IV  40 mg Intravenous Q12H  . pravastatin  20 mg Oral q1800  . saccharomyces boulardii  250 mg Oral BID  . sevelamer carbonate  800 mg Oral TID WC  . sodium chloride  3 mL Intravenous Q12H  . sodium chloride  3 mL Intravenous Q12H    Continuous Infusions:   Kendell Bane RD, LDN, CNSC [phone] Pager [phone] After Hours Pager

## 2014-06-13 NOTE — H&P (View-Only) (Signed)
Unassigned Consult  Reason for Consult: Coffee-ground emesis, nausea, vomiting, and diarrhea Referring Physician: [facility]  Elizabeth [name] HPI: This is a 69 year old female with a PMH of ESRD on dialysis, CAD, hyperlipidemia, HTN, PVD, and DM admitted for complaints of acute nausea, vomiting, diarrhea, and diffuse abdominal pain.  On a baseline she has an HGB that ranges from 8-10 g/dL.  She reports having intermittent vomiting.  Last month she vomited, but it was clear.  Also, she has issues with intermittent diarrhea and this is not a new issue for her.  She denies any history of hematemesis, melena, or hematochezia.  Many years ago she states having a similar event, but no further work up was pursued.  The patient denies any history of GERD, PUD, or NSAID use.  Past Medical History  Diagnosis Date  . Hyperlipidemia   . Urinary retention     DX NEUROGENIC BLADDER--HAS INDWELLING FOLEY CATHETER  . Arthritis   . Anemia   . CAD (coronary artery disease)   . Neuropathic pain   . Stroke     patient not aware.  . Foley catheter in place     for urinary retention  . Critical lower limb ischemia   . Hypertension   . Peripheral vascular disease   . Hypoglycemia 08/19/2013  . Hypokalemia 08/19/2013  . Diabetes mellitus     TAKES INSULIN   . ESRD (end stage renal disease)     [address], TH, S    Past Surgical History  Procedure Laterality Date  . Insertion of suprapubic catheter N/A 05/16/2012    Procedure: INSERTION OF SUPRAPUBIC CATHETER;  Surgeon: Theodore Manny, MD;  Location: [facility];  Service: Urology;  Laterality: N/A;  . Bascilic vein transposition Left 10/30/2012    Procedure: LEFT 1ST STAGE BASCILIC VEIN TRANSPOSITION;  Surgeon: Christopher S Dickson, MD;  Location: [facility] OR;  Service: Vascular;  Laterality: Left;  . Nm myocar perf wall motion  04/19/2012    low risk study-EF 44%,mild inferolateral hypkinesis  . Bascilic vein transposition Left 02/05/2013   Procedure: BASCILIC VEIN TRANSPOSITION 2ND STAGE;  Surgeon: Christopher S Dickson, MD;  Location: [facility] OR;  Service: Vascular;  Laterality: Left;  . Radiology with anesthesia N/A 10/25/2013    Procedure: RADIOLOGY WITH ANESTHESIA;  Surgeon: Medication Radiologist, MD;  Location: [facility] OR;  Service: Radiology;  Laterality: N/A;  . Fistulogram Left 04/29/2013    Procedure: FISTULOGRAM;  Surgeon: Christopher S Dickson, MD;  Location: [facility];  Service: Cardiovascular;  Laterality: Left;  . Angioplasty Left 04/29/2013    Procedure: ANGIOPLASTY;  Surgeon: Christopher S Dickson, MD;  Location: [facility];  Service: Cardiovascular;  Laterality: Left;  . Abdominal aortagram N/A 03/19/2014    Procedure: ABDOMINAL AORTAGRAM;  Surgeon: Charles E Fields, MD;  Location: [facility];  Service: Cardiovascular;  Laterality: N/A;  . Left and right heart catheterization with coronary angiogram N/A 03/24/2014    Procedure: LEFT AND RIGHT HEART CATHETERIZATION WITH CORONARY ANGIOGRAM;  Surgeon: David W Harding, MD;  Location: [facility];  Service: Cardiovascular;  Laterality: N/A;  . Endarterectomy popliteal Left 03/26/2014    Procedure: Left popliteal and peroneal artery endarterectomy with vein patch angioplasty;  Surgeon: Charles E Fields, MD;  Location: [facility] OR;  Service: Vascular;  Laterality: Left;  . Amputation Left 03/26/2014    Procedure: AMPUTATION SECOND LEFT TOE;  Surgeon: Charles E Fields, MD;  Location: [facility] OR;  Service: Vascular;  Laterality: Left;  . Av   fistula placement Left 04/28/2014    Procedure: CREATION OF LEFT BRACHIOCEPHALIC  ARTERIOVENOUS (AV) FISTULA CREATION;  Surgeon: Brian L Chen, MD;  Location: [facility] OR;  Service: Vascular;  Laterality: Left;  . Amputation Left 05/10/2014    Procedure: AMPUTATION BELOW KNEE;  Surgeon: Todd F Early, MD;  Location: [facility] OR;  Service: Vascular;  Laterality: Left;    Family History  Problem Relation Age of Onset  . Hyperlipidemia Mother   . Hypertension Mother   . Hodgkin's  lymphoma Father   . Heart disease Sister     Social History:  reports that she has never smoked. She has never used smokeless tobacco. She reports that she does not drink alcohol or use illicit drugs.  Allergies: No Known Allergies  Medications: Scheduled: Continuous:  Results for orders placed or performed during the hospital encounter of 06/12/14 (from the past 24 hour(s))  CBC with Differential     Status: Abnormal   Collection Time: 06/12/14 12:17 PM  Result Value Ref Range   WBC 9.4 4.0 - 10.5 K/uL   RBC 5.32 (H) 3.87 - 5.11 MIL/uL   Hemoglobin 14.0 12.0 - 15.0 g/dL   HCT 44.4 36.0 - 46.0 %   MCV 83.5 78.0 - 100.0 fL   MCH 26.3 26.0 - 34.0 pg   MCHC 31.5 30.0 - 36.0 g/dL   RDW 19.0 (H) 11.5 - 15.5 %   Platelets 168 150 - 400 K/uL   Neutrophils Relative % 88 (H) 43 - 77 %   Neutro Abs 8.3 (H) 1.7 - 7.7 K/uL   Lymphocytes Relative 6 (L) 12 - 46 %   Lymphs Abs 0.6 (L) 0.7 - 4.0 K/uL   Monocytes Relative 5 3 - 12 %   Monocytes Absolute 0.5 0.1 - 1.0 K/uL   Eosinophils Relative 1 0 - 5 %   Eosinophils Absolute 0.1 0.0 - 0.7 K/uL   Basophils Relative 0 0 - 1 %   Basophils Absolute 0.0 0.0 - 0.1 K/uL  POC occult blood, ED Provider will collect     Status: None   Collection Time: 06/12/14 12:56 PM  Result Value Ref Range   Fecal Occult Bld NEGATIVE NEGATIVE  Urinalysis, Routine w reflex microscopic     Status: Abnormal   Collection Time: 06/12/14  1:00 PM  Result Value Ref Range   Color, Urine YELLOW YELLOW   APPearance TURBID (A) CLEAR   Specific Gravity, Urine 1.013 1.005 - 1.030   pH 7.0 5.0 - 8.0   Glucose, UA NEGATIVE NEGATIVE mg/dL   Hgb urine dipstick MODERATE (A) NEGATIVE   Bilirubin Urine NEGATIVE NEGATIVE   Ketones, ur NEGATIVE NEGATIVE mg/dL   Protein, ur 100 (A) NEGATIVE mg/dL   Urobilinogen, UA 0.2 0.0 - 1.0 mg/dL   Nitrite NEGATIVE NEGATIVE   Leukocytes, UA LARGE (A) NEGATIVE  Urine microscopic-add on     Status: Abnormal   Collection Time: 06/12/14   1:00 PM  Result Value Ref Range   Squamous Epithelial / LPF FEW (A) RARE   WBC, UA TOO NUMEROUS TO COUNT <3 WBC/hpf   RBC / HPF 3-6 <3 RBC/hpf   Bacteria, UA MANY (A) RARE   Casts GRANULAR CAST (A) NEGATIVE   Urine-Other MANY YEAST   Occult bld gastric/duodenum (cup to lab)     Status: Abnormal   Collection Time: 06/12/14  1:08 PM  Result Value Ref Range   pH, Gastric NOT DONE    Occult Blood, Gastric POSITIVE (A) NEGATIVE  Comprehensive   metabolic panel     Status: Abnormal   Collection Time: 06/12/14  3:41 PM  Result Value Ref Range   Sodium 136 135 - 145 mmol/L   Potassium 5.4 (H) 3.5 - 5.1 mmol/L   Chloride 101 96 - 112 mmol/L   CO2 24 19 - 32 mmol/L   Glucose, Bld 172 (H) 70 - 99 mg/dL   BUN 73 (H) 6 - 23 mg/dL   Creatinine, Ser 4.00 (H) 0.50 - 1.10 mg/dL   Calcium 8.1 (L) 8.4 - 10.5 mg/dL   Total Protein 6.2 6.0 - 8.3 g/dL   Albumin 2.3 (L) 3.5 - 5.2 g/dL   AST 31 0 - 37 U/L   ALT 36 (H) 0 - 35 U/L   Alkaline Phosphatase 138 (H) 39 - 117 U/L   Total Bilirubin 0.6 0.3 - 1.2 mg/dL   GFR calc non Af Amer 10 (L) >90 mL/min   GFR calc Af Amer 12 (L) >90 mL/min   Anion gap 11 5 - 15  Type and screen     Status: None (Preliminary result)   Collection Time: 06/12/14  4:29 PM  Result Value Ref Range   ABO/RH(D) O POS    Antibody Screen PENDING    Sample Expiration 06/15/2014      Dg Abd Acute W/chest  06/12/2014   CLINICAL DATA:  Acute nausea, vomiting and diarrhea.  EXAM: DG ABDOMEN ACUTE W/ 1V CHEST  COMPARISON:  May 09, 2014.  FINDINGS: There is no evidence of dilated bowel loops or free intraperitoneal air. No radiopaque calculi or other significant radiographic abnormality is seen. Stable cardiomediastinal silhouette. Right internal jugular dialysis catheter is unchanged in position. Moderate right pleural effusion is noted which is increased compared to prior exam. Mild diffuse interstitial densities are noted consistent with pulmonary edema.  IMPRESSION: No evidence of  bowel obstruction or ileus. Mild bilateral pulmonary edema is noted with moderate right pleural effusion.   Electronically Signed   By: James  Green Jr, M.D.   On: 06/12/2014 13:55    ROS:  As stated above in the HPI otherwise negative.  Blood pressure 129/77, pulse 116, temperature 97.8 F (36.6 C), temperature source Oral, resp. rate 15, height 5' 3" (1.6 m), weight 37.195 kg (82 lb), SpO2 100 %.    PE: Gen: NAD, Alert and Oriented HEENT:  [place]/AT, EOMI Neck: Supple, no LAD Lungs: CTA Bilaterally CV: RRR without M/G/R ABM: Soft, epigastric tenderness, +BS Ext: No C/C/E  Assessment/Plan: 1) Coffee-ground emesis. 2) N/V. 3) Diarrhea. 4) ESRD.   The exam did elicit epigastric pain, but it appears that the pain is secondary to the vomiting.  I will pursue further work up with an EGD.  Plan: 1) EGD tomorrow. 2) Follow HGB and transfuse if necessary.  Birda Didonato D 06/12/2014, 5:29 PM      

## 2014-06-13 NOTE — Consult Note (Signed)
[facility] Renal Consultation Note    Indication for Consultation:  Management of ESRD/hemodialysis; anemia, hypertension/volume and secondary hyperparathyroidism PCP: Dr. Lucianne Muss  HPI: Elizabeth [name] is a 69 y.o. female with ESRD secondary to DM on HD since July 2015 on TTS dialysis schedule who resides at [facility]. PMHx also significant for PCM, PVD, left BKA, sacaral decub 04/2014.  She presented to the ED with Hematemesis yesterday and subsequently missed her last HD tmt.   She has been above her EDW and unable to UF more than 1.5 [name] at a time. Most recent outpt Hgb was 12.5 on 4/14  She denies abdominal pain, N, V, D, fever chills or cough. She is weak but not SOB. Her appetite is always poor. She makes a small amount of urine. She said her sacral decub is almost healed. Her left hand is always cold and she is wearing a glove. (It was cold before she got her AVF). She said the endoscopy showed something with her esophagus.  Past Medical History  Diagnosis Date  . Hyperlipidemia   . Urinary retention     DX NEUROGENIC BLADDER--HAS INDWELLING FOLEY CATHETER  . Anemia   . CAD (coronary artery disease)   . Neuropathic pain   . Foley catheter in place     for urinary retention  . Critical lower limb ischemia   . Hypertension   . Peripheral vascular disease   . Hypoglycemia 08/19/2013  . Hypokalemia 08/19/2013  . IDDM (insulin dependent diabetes mellitus)   . Hematemesis 06/12/2014 hospitalized  . Stroke     "minor one"  . Arthritis     "right knee" (06/12/2014)  . Gout     "was in my LLE"  . ESRD (end stage renal disease)     [address], [place], S (06/12/2014)   Past Surgical History  Procedure Laterality Date  . Insertion of suprapubic catheter N/A 05/16/2012    Procedure: INSERTION OF SUPRAPUBIC CATHETER;  Surgeon: Sebastian Ache, MD;  Location: [facility] ORS;  Service: Urology;  Laterality: N/A;  . Bascilic vein transposition Left 10/30/2012    Procedure: LEFT 1ST  STAGE BASCILIC VEIN TRANSPOSITION;  Surgeon: Chuck Hint, MD;  Location: [facility] OR;  Service: Vascular;  Laterality: Left;  . Nm myocar perf wall motion  04/19/2012    low risk study-EF 44%,mild inferolateral hypkinesis  . Bascilic vein transposition Left 02/05/2013    Procedure: BASCILIC VEIN TRANSPOSITION 2ND STAGE;  Surgeon: Chuck Hint, MD;  Location: [facility] OR;  Service: Vascular;  Laterality: Left;  . Radiology with anesthesia N/A 10/25/2013    Procedure: RADIOLOGY WITH ANESTHESIA;  Surgeon: Medication Radiologist, MD;  Location: [facility] OR;  Service: Radiology;  Laterality: N/A;  . Fistulogram Left 04/29/2013    Procedure: FISTULOGRAM;  Surgeon: Chuck Hint, MD;  Location: [facility] CATH LAB;  Service: Cardiovascular;  Laterality: Left;  . Angioplasty Left 04/29/2013    Procedure: ANGIOPLASTY;  Surgeon: Chuck Hint, MD;  Location: [facility] CATH LAB;  Service: Cardiovascular;  Laterality: Left;  . Abdominal aortagram N/A 03/19/2014    Procedure: ABDOMINAL Ronny Flurry;  Surgeon: Sherren Kerns, MD;  Location: [facility] CATH LAB;  Service: Cardiovascular;  Laterality: N/A;  . Left and right heart catheterization with coronary angiogram N/A 03/24/2014    Procedure: LEFT AND RIGHT HEART CATHETERIZATION WITH CORONARY ANGIOGRAM;  Surgeon: Marykay Lex, MD;  Location: [facility] CATH LAB;  Service: Cardiovascular;  Laterality: N/A;  . Endarterectomy popliteal Left 03/26/2014    Procedure: Left  popliteal and peroneal artery endarterectomy with vein patch angioplasty;  Surgeon: Sherren Kerns, MD;  Location: [facility] OR;  Service: Vascular;  Laterality: Left;  . Amputation Left 03/26/2014    Procedure: AMPUTATION SECOND LEFT TOE;  Surgeon: Sherren Kerns, MD;  Location: [facility] OR;  Service: Vascular;  Laterality: Left;  . Av fistula placement Left 04/28/2014    Procedure: CREATION OF LEFT BRACHIOCEPHALIC  ARTERIOVENOUS (AV) FISTULA CREATION;  Surgeon: Fransisco Hertz, MD;  Location: [facility] OR;  Service: Vascular;   Laterality: Left;  . Amputation Left 05/10/2014    Procedure: AMPUTATION BELOW KNEE;  Surgeon: Larina Earthly, MD;  Location: [facility] OR;  Service: Vascular;  Laterality: Left;   Family History  Problem Relation Age of Onset  . Hyperlipidemia Mother   . Hypertension Mother   . Hodgkin's lymphoma Father   . Heart disease Sister    Social History:  reports that she has never smoked. She has never used smokeless tobacco. She reports that she drinks alcohol. She reports that she does not use illicit drugs. No Known Allergies Prior to Admission medications   Medication Sig Start Date End Date Taking? Authorizing Provider  acetaminophen-codeine (TYLENOL #3) 300-30 MG per tablet Take 1 tablet by mouth every 6 (six) hours as needed for moderate pain. 05/13/14  Yes Joseph Art, DO  Amino Acids-Protein Hydrolys (FEEDING SUPPLEMENT, PRO-STAT SUGAR FREE 64,) LIQD Take 30 mLs by mouth 3 (three) times daily with meals.   Yes Historical Provider, MD  aspirin EC 81 MG tablet Take 81 mg by mouth daily.   Yes Historical Provider, MD  carvedilol (COREG) 6.25 MG tablet take 1 tablet by mouth twice a day 04/17/14  Yes Chrystie Nose, MD  cetirizine (ZYRTEC) 10 MG tablet Take 10 mg by mouth as needed for allergies.   Yes Historical Provider, MD  Darbepoetin Alfa (ARANESP) 200 MCG/0.4ML SOSY injection Inject 0.4 mLs (200 mcg total) into the vein every Thursday with hemodialysis. 05/15/14  Yes Joseph Art, DO  docusate sodium (COLACE) 100 MG capsule Take 1 capsule (100 mg total) by mouth daily. 03/28/14  Yes Starleen Arms, MD  doxercalciferol (HECTOROL) 4 MCG/2ML injection Inject 0.5 mLs (1 mcg total) into the vein Every Tuesday,Thursday,and Saturday with dialysis. 05/13/14  Yes Joseph Art, DO  glucose blood (ONETOUCH VERIO) test strip Use as instructed to check blood sugar 4 times per day dx code E13.10 02/17/14  Yes [name], MD  insulin aspart (NOVOLOG) 100 UNIT/ML injection Inject 0-15 Units into the skin 3  (three) times daily with meals. Patient taking differently: Inject 5 Units into the skin 3 (three) times daily as needed for high blood sugar (CBG >150).  03/28/14  Yes Starleen Arms, MD  insulin glargine (LANTUS) 100 UNIT/ML injection Inject 0.07 mLs (7 Units total) into the skin daily at 10 pm. Patient taking differently: Inject 10 Units into the skin daily at 10 pm.  05/13/14  Yes Joseph Art, DO  Multiple Vitamins-Minerals (DECUBI-VITE) CAPS Take 2 capsules by mouth daily.   Yes Historical Provider, MD  Nutritional Supplements (FEEDING SUPPLEMENT, NEPRO CARB STEADY,) LIQD Take 237 mLs by mouth daily at 2 PM daily at 2 PM. 05/13/14  Yes Joseph Art, DO  ONETOUCH DELICA LANCETS FINE MISC Use to check blood sugar 4 times per day 09/16/13  Yes [name], MD  oxyCODONE-acetaminophen (PERCOCET/ROXICET) 5-325 MG per tablet Take 1 tablet by mouth every 8 (eight) hours as needed for moderate  pain or severe pain. 05/13/14  Yes Jessica U Vann, DO  pantoprazole (PROTONIX) 40 MG tablet Take 1 tablet (40 mg total) by mouth daily. 05/13/14  Yes Joseph Art, DO  pravastatin (PRAVACHOL) 20 MG tablet Take 20 mg by mouth daily.   Yes Historical Provider, MD  saccharomyces boulardii (FLORASTOR) 250 MG capsule Take 1 capsule (250 mg total) by mouth 2 (two) times daily. 03/28/14  Yes Starleen Arms, MD  sevelamer carbonate (RENVELA) 800 MG tablet Take 1 tablet (800 mg total) by mouth 3 (three) times daily with meals. 12/28/13  Yes Ripudeep Jenna Luo, MD  Wound Dressings (HYDROGEL) GEL Apply 1 application topically as needed (for sacrum).   Yes Historical Provider, MD  aspirin EC 81 MG tablet Take 81 mg by mouth daily.    Historical Provider, MD  loratadine (CLARITIN) 10 MG tablet Take 10 mg by mouth daily as needed for allergies.    Historical Provider, MD   Current Facility-Administered Medications  Medication Dose Route Frequency Provider Last Rate Last Dose  . 0.9 %  sodium chloride infusion   Intravenous  Continuous Jeani Hawking, MD 20 mL/hr at 06/13/14 1123    . 0.9 %  sodium chloride infusion  250 mL Intravenous PRN Rodolph Bong, MD      . acetaminophen (TYLENOL) tablet 650 mg  650 mg Oral Q6H PRN Rodolph Bong, MD       Or  . acetaminophen (TYLENOL) suppository 650 mg  650 mg Rectal Q6H PRN Rodolph Bong, MD      . acetaminophen-codeine (TYLENOL #3) 300-30 MG per tablet 1 tablet  1 tablet Oral Q6H PRN Rodolph Bong, MD      . albuterol (PROVENTIL) (2.5 MG/3ML) 0.083% nebulizer solution 2.5 mg  2.5 mg Nebulization Q2H PRN Rodolph Bong, MD      . Melene Muller ON 06/19/2014] Darbepoetin Alfa (ARANESP) injection 200 mcg  200 mcg Intravenous Q Thu-HD Ramiro Harvest V, MD      . docusate sodium (COLACE) capsule 100 mg  100 mg Oral BID Rodolph Bong, MD   100 mg at 06/13/14 0917  . [START ON 06/14/2014] doxercalciferol (HECTOROL) injection 1 mcg  1 mcg Intravenous Q T,Th,Sa-HD Ramiro Harvest V, MD      . feeding supplement (NEPRO CARB STEADY) liquid 237 mL  237 mL Oral Q1400 Rodolph Bong, MD   237 mL at 06/12/14 2346  . feeding supplement (PRO-STAT SUGAR FREE 64) liquid 30 mL  30 mL Oral TID WC Rodolph Bong, MD   30 mL at 06/13/14 0842  . HYDROcodone-acetaminophen (NORCO/VICODIN) 5-325 MG per tablet 1-2 tablet  1-2 tablet Oral Q4H PRN Ramiro Harvest V, MD      . insulin aspart (novoLOG) injection 0-9 Units  0-9 Units Subcutaneous TID WC Rodolph Bong, MD   0 Units at 06/13/14 [phone]  . insulin glargine (LANTUS) injection 5 Units  5 Units Subcutaneous Daily Ramiro Harvest V, MD      . ipratropium (ATROVENT) nebulizer solution 0.5 mg  0.5 mg Nebulization Q2H PRN Rodolph Bong, MD      . lactated ringers infusion   Intravenous Continuous Jeani Hawking, MD      . loratadine (CLARITIN) tablet 10 mg  10 mg Oral Daily PRN Ramiro Harvest V, MD      . metoprolol (LOPRESSOR) injection 2.5 mg  2.5 mg Intravenous 3 times per day Rodolph Bong, MD   2.5 mg at 06/13/14 1332   .  multivitamin (RENA-VIT) tablet 1 tablet  1 tablet Oral QHS Rodolph Bong, MD      . ondansetron [facility]) tablet 4 mg  4 mg Oral Q6H PRN Rodolph Bong, MD       Or  . ondansetron [facility]) injection 4 mg  4 mg Intravenous Q6H PRN Rodolph Bong, MD   4 mg at 06/13/14 0516  . oxyCODONE-acetaminophen (PERCOCET/ROXICET) 5-325 MG per tablet 1 tablet  1 tablet Oral Q8H PRN Rodolph Bong, MD      . pantoprazole (PROTONIX) injection 40 mg  40 mg Intravenous Q12H Rodolph Bong, MD   40 mg at 06/13/14 0918  . pravastatin (PRAVACHOL) tablet 20 mg  20 mg Oral q1800 Rodolph Bong, MD   20 mg at 06/12/14 2346  . saccharomyces boulardii (FLORASTOR) capsule 250 mg  250 mg Oral BID Rodolph Bong, MD   250 mg at 06/13/14 [phone]  . sevelamer carbonate (RENVELA) tablet 800 mg  800 mg Oral TID WC Rodolph Bong, MD   800 mg at 06/13/14 1332  . sodium chloride 0.9 % injection 3 mL  3 mL Intravenous Q12H Rodolph Bong, MD   3 mL at 06/13/14 0920  . sodium chloride 0.9 % injection 3 mL  3 mL Intravenous Q12H Rodolph Bong, MD   3 mL at 06/13/14 0920  . sodium chloride 0.9 % injection 3 mL  3 mL Intravenous PRN Rodolph Bong, MD      . sorbitol 70 % solution 30 mL  30 mL Oral Daily PRN Rodolph Bong, MD      . zolpidem [facility]) tablet 5 mg  5 mg Oral QHS PRN Rodolph Bong, MD       Labs: Basic Metabolic Panel:  Recent Labs Lab 06/12/14 1541 06/13/14 0531  NA 136 136  K 5.4* 5.4*  CL 101 99  CO2 24 24  GLUCOSE 172* 245*  BUN 73* 85*  CREATININE 4.00* 4.72*  CALCIUM 8.1* 8.5   Liver Function Tests:  Recent Labs Lab 06/12/14 1541 06/13/14 0531  AST 31 28  ALT 36* 33  ALKPHOS 138* 132*  BILITOT 0.6 0.4  PROT 6.2 6.4  ALBUMIN 2.3* 2.2*  CBC:  Recent Labs Lab 06/12/14 1217 06/12/14 2143 06/13/14 0531  WBC 9.4 14.3* 9.2  NEUTROABS 8.3*  --   --   HGB 14.0 12.1 11.7*  HCT 44.4 39.0 37.0  MCV 83.5 82.8 82.2  PLT 168 166 141*   CBG:  Recent  Labs Lab 06/12/14 2134 06/13/14 0736  GLUCAP 162* 182*  Studies/Results: Dg Abd Acute W/chest  06/12/2014   CLINICAL DATA:  Acute nausea, vomiting and diarrhea.  EXAM: DG ABDOMEN ACUTE W/ 1V CHEST  COMPARISON:  May 09, 2014.  FINDINGS: There is no evidence of dilated bowel loops or free intraperitoneal air. No radiopaque calculi or other significant radiographic abnormality is seen. Stable cardiomediastinal silhouette. Right internal jugular dialysis catheter is unchanged in position. Moderate right pleural effusion is noted which is increased compared to prior exam. Mild diffuse interstitial densities are noted consistent with pulmonary edema.  IMPRESSION: No evidence of bowel obstruction or ileus. Mild bilateral pulmonary edema is noted with moderate right pleural effusion.   Electronically Signed   By: Lupita Raider, M.D.   On: 06/12/2014 13:55    ROS: As per HPI otherwise negative. Endocrine No DM.  No Thyroid disease.  No Adrenal disease.  Physical Exam: Filed Vitals:  06/13/14 1228 06/13/14 1230 06/13/14 1240 06/13/14 1331  BP: 112/69  Pulse: 104 102 103 107  Temp:  97.7 F (36.5 C)  97.5 F (36.4 C)  TempSrc:  Oral  Oral  Resp: Height:      Weight:      SpO2: 100% 100% 99% 96%     General: emaciated AA female NAD Head: Normocephalic, atraumatic, sclera non-icteric, mucus membranes are moist scarring in both retinas Neck: Supple PCL Lungs: dim right base bilateral crackles  Heart: tachy reg Gr 2/6 M Abdomen: Soft, non-tender, non-distended with normoactive bowel sounds. No rebound/guarding. No obvious abdominal masses. Liver down 4 cm M-S:  Strength and tone appear normal for age. Lower extremities: right LEwithout edema or ischemic changes, no open wounds left BKA healed Neuro: Alert and oriented X 3. Moves all extremities spontaneously. Psych:  Responds to questions appropriately with a normal affect. Dialysis Access:left upper AVF +  bruit (04/28/14)and right IJ cath  Dialysis Orders: Center: [facility] TTS 3.5 hr EDW 35 2K 2.25 Ca profile 4 temp 36 left upper AVF 16 gu Qb 300 DFR 800 hectorol 1 heparin 3700 no ESA or Fe - Last received 200 Aranesp 3/31  Assessment/Plan: 1. Hematemesis - HOLD heparin with HD - no acute drop in Hgb yet.  Endo by Dr. Elnoria Howard showed ? achalasia - for esophagram. 2. ESRD -  TTS - missed Thursday this week - plan HD today and Saturday due to volume issues (still using catheter)effusion and Pulm edema on CXR, suspect up 3-5 [name].  Get VVS to see re avf and devel 3. Hypotension/volume  - pulmonary edema on CXR - unable to get volume off without BP drops at outpt unit - net UF usual 1.5L max- EF 35% 03/2014; she has been on coreg 6.25 bid - may need to add midodrine for BP support; BB will limit BP and volume removal. O2 sats ok on room air 4. Anemia  - Hgb 11.7 - no ESA for now 5. Metabolic bone disease -  Continue Hectorol +renvela 6. PC Malnutrition - RD consult - needs small freq feedings  Elizabeth Slider, PA-C [facility] Beeper [phone] 06/13/2014, 1:34 PM I have seen and examined this patient and agree with the plan of care seen, examined, counseled, eval . Discussed with staff. .  Elizabeth [name] 06/13/2014, 2:41 PM

## 2014-06-13 NOTE — Transfer of Care (Signed)
Immediate Anesthesia Transfer of Care Note  Patient: Elizabeth [name]  Procedure(s) Performed: Procedure(s): ESOPHAGOGASTRODUODENOSCOPY (EGD) (N/A)  Patient Location: Endoscopy Unit  Anesthesia Type:MAC  Level of Consciousness: lethargic  Airway & Oxygen Therapy: Patient Spontanous Breathing and Patient connected to nasal cannula oxygen  Post-op Assessment: Report given to RN  Post vital signs: Reviewed and stable  Last Vitals:  Filed Vitals:   06/13/14 1228  BP: 88/56  Pulse: 104  Temp:   Resp: 15    Complications: No apparent anesthesia complications

## 2014-06-13 NOTE — Evaluation (Signed)
Physical Therapy Evaluation Patient Details Name: Elizabeth [name] MRN: [id number] DOB: Oct 11, 1945 Today's Date: 06/13/2014   History of Present Illness  Pt is a 69 y.o. Female adm with hematemesis; to undergo EGD 4/22; PMH  L BKA on 05/01/14, urinary retention with indwelling foley cath, CAD, neuropathic pain, CVA, PVD, HTN, DM, ESRD on HD T/Th/S     Clinical Impression  Pt admitted with above diagnosis. Pt currently with functional limitations due to the deficits listed below (see PT Problem List). Pt currently limited by nausea, however is motivated to work with PT and "get my prosthesis soon." Pt will benefit from skilled PT to increase their independence and safety with mobility to allow discharge to the venue listed below.       Follow Up Recommendations SNF    Equipment Recommendations  None recommended by PT    Recommendations for Other Services       Precautions / Restrictions Precautions Precautions: Fall      Mobility  Bed Mobility               General bed mobility comments: deferred due to incr nausea with ROM, exercises  Transfers                    Ambulation/Gait                Stairs            Wheelchair Mobility    Modified Rankin (Stroke Patients Only)       Balance                                             Pertinent Vitals/Pain Nausea (pre-medicated)  5/10 at rest; incr 7/10 with bed level activities; pt requested to defer EOB  Pain Assessment: No/denies pain    Home Living Family/patient expects to be discharged to:: Skilled nursing facility                 Additional Comments: from SNF (doing rehab post BKA)    Prior Function Level of Independence: Needs assistance   Gait / Transfers Assistance Needed: walking with walker in halls with assist; propels w/c with supervision           Hand Dominance   Dominant Hand: Right    Extremity/Trunk Assessment   Upper Extremity  Assessment: Generalized weakness           Lower Extremity Assessment: Generalized weakness;LLE deficits/detail   LLE Deficits / Details: Lt BKA; AROM Lt knee 0-110 flexion     Communication   Communication: No difficulties  Cognition Arousal/Alertness: Awake/alert Behavior During Therapy: WFL for tasks assessed/performed Overall Cognitive Status: Within Functional Limits for tasks assessed                      General Comments      Exercises Amputee Exercises Quad Sets: AROM;Both;10 reps Hip Extension: AROM;Both;10 reps;Supine (bolster under distal thighs/bridging) Hip ABduction/ADduction: AROM;Left;10 reps;Supine Hip Flexion/Marching: AROM;Left;10 reps;Supine Knee Flexion: AROM;Left;10 reps;Supine Other Exercises Other Exercises: Rt ankle pumps, circles and heelcord stretches Other Exercises: issued level 3 (green) theraband for UE exercises. Pt too nauseous to attempt. PT demonstrated bil UE shoulder and elbow extension      Assessment/Plan    PT Assessment Patient needs continued PT services  PT Diagnosis Generalized weakness   PT  Problem List Decreased strength;Decreased activity tolerance;Decreased balance;Decreased mobility;Decreased knowledge of use of DME  PT Treatment Interventions DME instruction;Gait training;Functional mobility training;Therapeutic activities;Therapeutic exercise;Balance training;Patient/family education   PT Goals (Current goals can be found in the Care Plan section) Acute Rehab PT Goals Patient Stated Goal: agrees wants to maintain strength and resume activity PT Goal Formulation: With patient Time For Goal Achievement: 06/20/14 Potential to Achieve Goals: Good    Frequency Min 2X/week   Barriers to discharge        Co-evaluation               End of Session   Activity Tolerance: Treatment limited secondary to medical complications (Comment) (increasing nausea despite meds) Patient left: in bed;with call  bell/phone within reach           Time: 0835-0857 PT Time Calculation (min) (ACUTE ONLY): 22 min   Charges:   PT Evaluation $Initial PT Evaluation Tier I: 1 Procedure     PT G Codes:        Franklin Baumbach Jun 18, 2014, 9:16 AM Pager [phone]

## 2014-06-14 DIAGNOSIS — N186 End stage renal disease: Secondary | ICD-10-CM

## 2014-06-14 LAB — RENAL FUNCTION PANEL
ALBUMIN: 2.2 g/dL — AB (ref 3.5–5.2)
ANION GAP: 9 (ref 5–15)
BUN: 32 mg/dL — AB (ref 6–23)
CHLORIDE: 99 mmol/L (ref 96–112)
CO2: 27 mmol/L (ref 19–32)
Calcium: 8.2 mg/dL — ABNORMAL LOW (ref 8.4–10.5)
Creatinine, Ser: 2.99 mg/dL — ABNORMAL HIGH (ref 0.50–1.10)
GFR calc Af Amer: 17 mL/min — ABNORMAL LOW (ref >90)
GFR, EST NON AFRICAN AMERICAN: 15 mL/min — AB (ref >90)
GLUCOSE: 166 mg/dL — AB (ref 70–99)
PHOSPHORUS: 4.5 mg/dL (ref 2.3–4.6)
Potassium: 4.2 mmol/L (ref 3.5–5.1)
SODIUM: 135 mmol/L (ref 135–145)

## 2014-06-14 LAB — CBC
HCT: 38.1 % (ref 36.0–46.0)
HEMATOCRIT: 38.2 % (ref 36.0–46.0)
HEMOGLOBIN: 11.8 g/dL — AB (ref 12.0–15.0)
Hemoglobin: 12 g/dL (ref 12.0–15.0)
MCH: 25.8 pg — AB (ref 26.0–34.0)
MCH: 25.5 pg — AB (ref 26.0–34.0)
MCHC: 31.4 g/dL (ref 30.0–36.0)
MCHC: 31 g/dL (ref 30.0–36.0)
MCV: 82.2 fL (ref 78.0–100.0)
MCV: 82.5 fL (ref 78.0–100.0)
PLATELETS: 119 10*3/uL — AB (ref 150–400)
Platelets: 139 10*3/uL — ABNORMAL LOW (ref 150–400)
RBC: 4.65 MIL/uL (ref 3.87–5.11)
RBC: 4.62 MIL/uL (ref 3.87–5.11)
RDW: 18.7 % — ABNORMAL HIGH (ref 11.5–15.5)
RDW: 18.6 % — ABNORMAL HIGH (ref 11.5–15.5)
WBC: 5.3 10*3/uL (ref 4.0–10.5)
WBC: 4.8 10*3/uL (ref 4.0–10.5)

## 2014-06-14 LAB — GLUCOSE, CAPILLARY
GLUCOSE-CAPILLARY: 149 mg/dL — AB (ref 70–99)
GLUCOSE-CAPILLARY: 164 mg/dL — AB (ref 70–99)
GLUCOSE-CAPILLARY: 169 mg/dL — AB (ref 70–99)
Glucose-Capillary: 124 mg/dL — ABNORMAL HIGH (ref 70–99)

## 2014-06-14 LAB — HEMOGLOBIN A1C
Hgb A1c MFr Bld: 6.7 % — ABNORMAL HIGH (ref 4.8–5.6)
MEAN PLASMA GLUCOSE: 146 mg/dL

## 2014-06-14 LAB — HEPATITIS B SURFACE ANTIGEN: Hepatitis B Surface Ag: NEGATIVE

## 2014-06-14 MED ORDER — INSULIN ASPART 100 UNIT/ML ~~LOC~~ SOLN: 0.0000 [IU] | SUBCUTANEOUS | Status: DC

## 2014-06-14 MED ORDER — PENTAFLUOROPROP-TETRAFLUOROETH EX AERO: 1.0000 "application " | INHALATION_SPRAY | CUTANEOUS | Status: DC | PRN

## 2014-06-14 MED ORDER — NEPRO/CARBSTEADY PO LIQD: 237.0000 mL | ORAL | Status: DC | PRN

## 2014-06-14 MED ORDER — SODIUM CHLORIDE 0.9 % IV SOLN: 100.0000 mL | INTRAVENOUS | Status: DC | PRN

## 2014-06-14 MED ORDER — ONDANSETRON HCL 4 MG/2ML IJ SOLN
INTRAMUSCULAR | Status: AC
  Filled 2014-06-14: qty 2

## 2014-06-14 MED ORDER — ALTEPLASE 2 MG IJ SOLR
2.0000 mg | Freq: Once | INTRAMUSCULAR | Status: DC | PRN
Start: 2014-06-14 — End: 2014-06-14

## 2014-06-14 MED ORDER — LIDOCAINE HCL (PF) 1 % IJ SOLN: 5.0000 mL | INTRAMUSCULAR | Status: DC | PRN

## 2014-06-14 MED ORDER — LIDOCAINE-PRILOCAINE 2.5-2.5 % EX CREA: 1.0000 "application " | TOPICAL_CREAM | CUTANEOUS | Status: DC | PRN

## 2014-06-14 MED ORDER — DOXERCALCIFEROL 4 MCG/2ML IV SOLN
INTRAVENOUS | Status: AC
  Filled 2014-06-14: qty 2

## 2014-06-14 MED ORDER — HEPARIN SODIUM (PORCINE) 1000 UNIT/ML DIALYSIS
1000.0000 [IU] | INTRAMUSCULAR | Status: DC | PRN
  Filled 2014-06-14: qty 1

## 2014-06-14 MED ORDER — INSULIN GLARGINE 100 UNIT/ML ~~LOC~~ SOLN
7.0000 [IU] | Freq: Every day | SUBCUTANEOUS | Status: DC
  Administered 2014-06-15: 7 [IU] via SUBCUTANEOUS
  Filled 2014-06-14 (×2): qty 0.07

## 2014-06-14 MED ORDER — DEXTROSE-NACL 5-0.45 % IV SOLN
INTRAVENOUS | Status: DC
  Administered 2014-06-14: 16:00:00 via INTRAVENOUS

## 2014-06-14 MED ORDER — ALTEPLASE 2 MG IJ SOLR
2.0000 mg | Freq: Once | INTRAMUSCULAR | Status: DC | PRN
  Filled 2014-06-14: qty 2

## 2014-06-14 MED ORDER — HEPARIN SODIUM (PORCINE) 1000 UNIT/ML DIALYSIS: 1000.0000 [IU] | INTRAMUSCULAR | Status: DC | PRN

## 2014-06-14 NOTE — Clinical Social Work Note (Signed)
Clinical Social Work Assessment  Patient Details  Name: Elizabeth [name] MRN: [id number] Date of Birth: August 14, 1945  Date of referral:  06/13/14               Reason for consult:  Facility Placement                Permission sought to share information with:  Family Supports Permission granted to share information::  Yes, Verbal Permission Granted  Name::     Hillis Range  Agency::     Relationship::  [employer] Information:  Patient could not remember  Housing/Transportation Living arrangements for the past 2 months:  Post-Acute Facility (Since March) Source of Information:  Patient Patient Interpreter Needed:  None Criminal Activity/Legal Involvement Pertinent to Current Situation/Hospitalization:  No - Comment as needed Significant Relationships:  Siblings Lives with:  Self Do you feel safe going back to the place where you live?  Yes Need for family participation in patient care:  Yes (Comment)  Care giving concerns: None   Social Worker assessment / plan: Patient to return to [facility] for ongoing SNF care. Patient states that she will be there for about 1 more week and then will return home once she has her prosthesis. If prosthesis not ready by then, she will go home and continue home health, according to patient.   Employment status:  Retired Health and safety inspector:  Armed forces operational officer, [employer] [place], [employer] PT Recommendations:  Skilled Nursing Facility Information / Referral to community resources:  Acute Rehab  Patient/Family's Response to care:  Patient agreeable to returning to facility for ongoing care.   Patient/Family's Understanding of and Emotional Response to Diagnosis, Current Treatment, and Prognosis:  Patient's affect was appropriate for potential transition.   Emotional Assessment Appearance:  Appears stated age Attitude/Demeanor/Rapport:   (Appropriate) Affect (typically observed):  Accepting Orientation:  Oriented to Self,  Oriented to Place, Oriented to  Time, Oriented to Situation Alcohol / Substance use:    Psych involvement (Current and /or in the community):  No (Comment)  Discharge Needs  Concerns to be addressed:  Denies Needs/Concerns at this time Readmission within the last 30 days:  No Current discharge risk:  None Barriers to Discharge:  No Barriers Identified   Beverly Sessions, LCSW 06/14/2014, 5:59 PM

## 2014-06-14 NOTE — Progress Notes (Addendum)
[facility]  Subjective  - Feels bad over all. No new complaints about the L AV fistula.   Objective 108/63 87 97.9 F (36.6 C) (Oral) 16 98%  Intake/Output Summary (Last 24 hours) at 06/14/14 0851 Last data filed at 06/13/14 2218  Gross per 24 hour  Intake    103 ml  Output   1863 ml  Net  -1760 ml   Palpable left BC thrill, visible BC AVF Radial pulse palpable Grip 5/5, patient wears glove on left hand states it feels cold without it    Assessment/Planning: 04/28/2014 creation of left BC fistula  Pending duplex of fistula Continue HD via right diatek for now.     Cl[facility] 06/14/2014 8:51 AM --  Laboratory Lab Results:  Recent Labs  06/13/14 2113 06/14/14 0533  WBC 5.6 5.3  HGB 12.6 12.0  HCT 39.6 38.2  PLT PLATELET CLUMPS NOTED ON SMEAR, UNABLE TO ESTIMATE 119*   BMET  Recent Labs  06/12/14 1541 06/13/14 0531  NA 136 136  K 5.4* 5.4*  CL 101 99  CO2 24 24  GLUCOSE 172* 245*  BUN 73* 85*  CREATININE 4.00* 4.72*  CALCIUM 8.1* 8.5    COAG Lab Results  Component Value Date   INR 1.15 06/12/2014   INR 1.21 03/24/2014   INR 1.07 03/18/2014   No results found for: PTT    Addendum  I have independently interviewed and examined the patient, and I agree with the physician assistant's findings.  Access duplex pending.  Regardless, the fistula is visible, so I would probably make an attempt at cannulation even without verification of maturation with duplex.  Leonides Sake, MD [facility] [place] Office: [phone] Pager: [phone]  06/14/2014, 9:24 AM

## 2014-06-14 NOTE — Progress Notes (Signed)
[facility]                                                                              Patient Demographics  Elizabeth [name], is a 69 y.o. female, DOB - 1945/03/31, WUJ:[id number]  Admit date - 06/12/2014   Admitting Physician Rodolph Bong, MD  Outpatient Primary MD for the patient is [name], MD  LOS - 2   Chief Complaint  Patient presents with  . 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 Patient reports taking aspirin but no NSAIDs. Patient denied any prior history of peptic ulcer disease. Patient reported some abdominal pain associated with emesis per patient. Patient reported intermittent dysuria ongoing for months that comes and goes at this site of catheter insertion per patient. Patient denied any hematochezia or melena.  Patient was seen in the emergency room noted to be slightly tachycardic. Gastric occult was positive. Comprehensive metabolic profile with a potassium of 5.4 BUN of 73 creatinine of 4.00 for status of 138 albumin of 2.3 ALT of 36 otherwise was within normal limits. Urinalysis was turbid, nitrite negative, large leukocytes, many yeast, too numerous to count WBCs, 3-6 RBCs, many bacteria, granular casts. CBC done had a white count of 9.4 hemoglobin of 14 platelet count of 168 otherwise was within normal limits. Acute abdominal series which was done showed no evidence of bowel obstruction or ileus. Mild bilateral pulmonary edema was noted with moderate right pleural effusion. Patient was given PPI IV 1. Nephrology was consulted. GI was also consulted per ED physician.    Assessment & Plan     Principal Problem:  hematemesis/coffee-ground emesis - Continue to hold aspirin, denies any NSAID use  - Hemoglobin stable  - Endoscopy 4/22 showed Achalasia, GI following, recommended esophagogram  ESRD on HD, TTS with hyperkalemia Likely secondary to end-stage renal disease.  Nephrology consulted, patient underwent hemodialysis 4/22, continue per schedule   Nausea or vomiting and diarrhea likely due to achalasia - Currently NPO, due to achalsia, esophagram pending - will place on gentle hydration   severe protein calorie malnutrition Continue nutritional supplementation.   diabetes type 2 - HbA1c 6.7  Placed on sliding scale insulin q4hrs, Lantus 7 units    status post BKA left lower extremity Stable. PT/OT, follow-up   tachycardia likely due to #1 Continue beta blocker.    hyperlipidemia Continue statin.  Code Status: Full code  Family Communication: Discussed in detail with the patient, all imaging results, lab results explained to the patient    Disposition Plan: pending GI evaluation  Time Spent in minutes   25 minutes  Procedures  EGD  Consults   Gastroenterology  DVT Prophylaxis SCD's  Medications  Scheduled Meds: . [START ON 06/19/2014] Darbepoetin Alfa  200 mcg Intravenous Q Thu-HD  . docusate sodium  100  mg Oral BID  . doxercalciferol  1 mcg Intravenous Q T,Th,Sa-HD  . feeding supplement (NEPRO CARB STEADY)  237 mL Oral Q1400  . feeding supplement (PRO-STAT SUGAR FREE 64)  30 mL Oral TID WC  . insulin aspart  0-9 Units Subcutaneous TID WC  . insulin glargine  5 Units Subcutaneous Daily  . metoprolol  2.5 mg Intravenous 3 times per day  . multivitamin  1 tablet Oral QHS  . pantoprazole (PROTONIX) IV  40 mg Intravenous Q12H  . pravastatin  20 mg Oral q1800  . saccharomyces boulardii  250 mg Oral BID  . sevelamer carbonate  800 mg Oral TID WC  . sodium chloride  3 mL Intravenous Q12H  . sodium chloride  3 mL Intravenous Q12H   Continuous  Infusions:   PRN Meds:.sodium chloride, sodium chloride, sodium chloride, acetaminophen **OR** acetaminophen, acetaminophen-codeine, albuterol, alteplase, feeding supplement (NEPRO CARB STEADY), heparin, HYDROcodone-acetaminophen, ipratropium, lidocaine (PF), lidocaine-prilocaine, loratadine, ondansetron **OR** ondansetron (ZOFRAN) IV, oxyCODONE-acetaminophen, pentafluoroprop-tetrafluoroeth, sodium chloride, sorbitol, zolpidem   Antibiotics   Anti-infectives    None        Subjective:   Evie Kates was seen and examined today.  Patient denies dizziness, chest pain, shortness of breath, abdominal pain, N/V/D/C, new weakness, numbess, tingling. Asking when she can eat. No nausea or vomiting at this time, no fevers or chills   Objective:   Blood pressure 121/76, pulse 97, temperature 97.1 F (36.2 C), temperature source Oral, resp. rate 85, height  (1.6 m), weight 37.6 kg (82 lb 14.3 oz), SpO2 97 %.  Wt Readings from Last 3 Encounters:  06/14/14 37.6 kg (82 lb 14.3 oz)  06/03/14 39.463 kg (87 lb)  05/27/14 35.834 kg (79 lb)     Intake/Output Summary (Last 24 hours) at 06/14/14 1227 Last data filed at 06/14/14 0900  Gross per 24 hour  Intake      3 ml  Output   2013 ml  Net  -2010 ml    Exam  General: Alert and oriented x 3, NAD  HEENT:  PERRLA, EOMI, Anicteic Sclera, mucous membranes moist.   Neck: Supple, no JVD, no masses  CVS: S1 Sclear, RRR  Chest: CTAB  Abdomen: Soft, NT, ND, NBS  Ext: no cyanosis clubbing or edema, left BKA   Neuro: AAOx3, Cr N's II- XII.   Skin: No rashes  Psych: Normal affect and demeanor, Ax Ox 3    Data Review   Micro Results Recent Results (from the past 240 hour(s))  Culture, Urine     Status: None   Collection Time: 06/12/14  1:00 PM  Result Value Ref Range Status   Specimen Description URINE, RANDOM  Final   Special Requests NONE  Final   Colony Count   Final    >=100,000 COLONIES/ML Performed at [facility]    Culture   Final    Multiple bacterial morphotypes present, none predominant. Suggest appropriate recollection if clinically indicated. Performed at [facility]    Report Status 06/13/2014 FINAL  Final  MRSA PCR Screening     Status: None   Collection Time: 06/12/14  7:16 PM  Result Value Ref Range Status   MRSA by PCR NEGATIVE NEGATIVE Final    Comment:        The GeneXpert MRSA Assay (FDA approved for NASAL specimens only), is one component of a comprehensive MRSA colonization surveillance program. It is not intended to diagnose MRSA infection nor to guide or monitor treatment for  MRSA infections.     Radiology Reports Dg Abd Acute W/chest  06/12/2014   CLINICAL DATA:  Acute nausea, vomiting and diarrhea.  EXAM: DG ABDOMEN ACUTE W/ 1V CHEST  COMPARISON:  May 09, 2014.  FINDINGS: There is no evidence of dilated bowel loops or free intraperitoneal air. No radiopaque calculi or other significant radiographic abnormality is seen. Stable cardiomediastinal silhouette. Right internal jugular dialysis catheter is unchanged in position. Moderate right pleural effusion is noted which is increased compared to prior exam. Mild diffuse interstitial densities are noted consistent with pulmonary edema.  IMPRESSION: No evidence of bowel obstruction or ileus. Mild bilateral pulmonary edema is noted with moderate right pleural effusion.   Electronically Signed   By: Lupita Raider, M.D.   On: 06/12/2014 13:55    CBC  Recent Labs Lab 06/12/14 1217  06/13/14 0531 06/13/14 1315 06/13/14 2113 06/14/14 0533 06/14/14 1015  WBC 9.4  < > 9.2 7.1 5.6 5.3 4.8  HGB 14.0  < > 11.7* 11.6* 12.6 12.0 11.8*  HCT 44.4  < > 37.0 36.5 39.6 38.2 38.1  PLT 168  < > 141* 151 PLATELET CLUMPS NOTED ON SMEAR, UNABLE TO ESTIMATE 119* 139*  MCV 83.5  < > 82.2 81.1 82.8 82.2 82.5  MCH 26.3  < > 26.0 25.8* 26.4 25.8* 25.5*  MCHC 31.5  < > 31.6 31.8 31.8 31.4 31.0  RDW 19.0*  < > 18.7* 18.6*  18.8* 18.7* 18.6*  LYMPHSABS 0.6*  --   --   --   --   --   --   MONOABS 0.5  --   --   --   --   --   --   EOSABS 0.1  --   --   --   --   --   --   BASOSABS 0.0  --   --   --   --   --   --   < > = values in this interval not displayed.  Chemistries   Recent Labs Lab 06/12/14 1541 06/13/14 0531  NA 136 136  K 5.4* 5.4*  CL 101 99  CO2 24 24  GLUCOSE 172* 245*  BUN 73* 85*  CREATININE 4.00* 4.72*  CALCIUM 8.1* 8.5  MG  --  2.2  AST 31 28  ALT 36* 33  ALKPHOS 138* 132*  BILITOT 0.6 0.4   ------------------------------------------------------------------------------------------------------------------ estimated creatinine clearance is 6.6 mL/min (by C-G formula based on Cr of 4.72). ------------------------------------------------------------------------------------------------------------------  Recent Labs  06/12/14 1217 06/12/14 2143  HGBA1C 6.5* 6.7*   ------------------------------------------------------------------------------------------------------------------ No results for input(s): CHOL, HDL, LDLCALC, TRIG, CHOLHDL, LDLDIRECT in the last 72 hours. ------------------------------------------------------------------------------------------------------------------ No results for input(s): TSH, T4TOTAL, T3FREE, THYROIDAB in the last 72 hours.  Invalid input(s): FREET3 ------------------------------------------------------------------------------------------------------------------ No results for input(s): VITAMINB12, FOLATE, FERRITIN, TIBC, IRON, RETICCTPCT in the last 72 hours.  Coagulation profile  Recent Labs Lab 06/12/14 2143  INR 1.15    No results for input(s): DDIMER in the last 72 hours.  Cardiac Enzymes No results for input(s): CKMB, TROPONINI, MYOGLOBIN in the last 168 hours.  Invalid input(s): CK ------------------------------------------------------------------------------------------------------------------ Invalid input(s):  POCBNP   Recent Labs  06/12/14 2134 06/13/14 0736 06/13/14 2041 06/13/14 2129 06/14/14 0816  GLUCAP 162* 182* 130* 171* 149*     RAI,RIPUDEEP M.D. [facility] 06/14/2014, 12:27 PM  Pager: [phone]   Between 7am to 7pm - call Pager - [phone]  After 7pm go to [email or web address] - password [email or web address]  Call night  coverage person covering after 7pm

## 2014-06-14 NOTE — Progress Notes (Signed)
Confirmed with GI, no esophagram today. Will do outpatient. Started patient on Full Liquid diet per Dr Isidoro Donning. Madelin Rear, MSN, RN, [employer]

## 2014-06-14 NOTE — Progress Notes (Signed)
  [facility] Progress Note   Subjective: No problems  Filed Vitals:   06/13/14 1830 06/13/14 2131 06/13/14 2213 06/14/14 0509  BP: 112/68 96/51 108/64 108/63  Pulse: 98 85 87 87  Temp: 97.6 F (36.4 C) 98 F (36.7 C)  97.9 F (36.6 C)  TempSrc: Oral Oral  Oral  Resp:  15  16  Height:      Weight: 38.4 kg (84 lb 10.5 oz)   38.9 kg (85 lb 12.1 oz)  SpO2: 95% 96% 96% 98%   Exam: Alert, no distress No jvd Chest clear bilat RRR 2/6 SEM Abd soft, NTND, no ascites L BKA healed, no LE edema Neuro is alert, Ox 3 LUA AVF +bruit and R IJ cath   HD: [facility] TTS 3.5h   2/2.25 bath  35kg   P4  LUA AVF  300/800  Heparin 3700 Hectorol 1 tiw , no other meds        Assessment: 1. Hematemesis - no hep HD, EGD showed dilated esophagus, for esophagram 2. ESRD on HD 3. Pulm edema/ effusion / vol excess - still 2-3 kg up 4. 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  Plan - HD today    Vinson Moselle MD  pager [phone]    cell [phone]  06/14/2014, 10:20 AM     Recent Labs Lab 06/12/14 1541 06/13/14 0531  NA 136 136  K 5.4* 5.4*  CL 101 99  CO2 24 24  GLUCOSE 172* 245*  BUN 73* 85*  CREATININE 4.00* 4.72*  CALCIUM 8.1* 8.5    Recent Labs Lab 06/12/14 1541 06/13/14 0531  AST 31 28  ALT 36* 33  ALKPHOS 138* 132*  BILITOT 0.6 0.4  PROT 6.2 6.4  ALBUMIN 2.3* 2.2*    Recent Labs Lab 06/12/14 1217  06/13/14 1315 06/13/14 2113 06/14/14 0533  WBC 9.4  < > 7.1 5.6 5.3  NEUTROABS 8.3*  --   --   --   --   HGB 14.0  < > 11.6* 12.6 12.0  HCT 44.4  < > 36.5 39.6 38.2  MCV 83.5  < > 81.1 82.8 82.2  PLT 168  < > 151 PLATELET CLUMPS NOTED ON SMEAR, UNABLE TO ESTIMATE 119*  < > = values in this interval not displayed. Melene Muller ON 06/19/2014] Darbepoetin Alfa  200 mcg Intravenous Q Thu-HD  . docusate sodium  100 mg Oral BID  . doxercalciferol  1 mcg Intravenous Q T,Th,Sa-HD  . feeding supplement (NEPRO CARB STEADY)  237 mL Oral Q1400  . feeding supplement  (PRO-STAT SUGAR FREE 64)  30 mL Oral TID WC  . insulin aspart  0-9 Units Subcutaneous TID WC  . insulin glargine  5 Units Subcutaneous Daily  . metoprolol  2.5 mg Intravenous 3 times per day  . multivitamin  1 tablet Oral QHS  . pantoprazole (PROTONIX) IV  40 mg Intravenous Q12H  . pravastatin  20 mg Oral q1800  . saccharomyces boulardii  250 mg Oral BID  . sevelamer carbonate  800 mg Oral TID WC  . sodium chloride  3 mL Intravenous Q12H  . sodium chloride  3 mL Intravenous Q12H     sodium chloride, acetaminophen **OR** acetaminophen, acetaminophen-codeine, albuterol, HYDROcodone-acetaminophen, ipratropium, loratadine, ondansetron **OR** ondansetron (ZOFRAN) IV, oxyCODONE-acetaminophen, sodium chloride, sorbitol, zolpidem

## 2014-06-14 NOTE — Progress Notes (Signed)
Left Upper Extremity AV Fistula Duplex    Cephalic  Segment Diameter Depth Comment  1. Axilla 4.25mm 1.43mm 134/80 velocity  2. Mid upper arm 7.12mm 1.24mm 367/218 velocity  3. Above AC 8.75mm 1.77mm   4. In [facility] 7.52mm 1.21mm 163/92 velocity

## 2014-06-15 LAB — GLUCOSE, CAPILLARY
GLUCOSE-CAPILLARY: 194 mg/dL — AB (ref 70–99)
GLUCOSE-CAPILLARY: 151 mg/dL — AB (ref 70–99)
Glucose-Capillary: 176 mg/dL — ABNORMAL HIGH (ref 70–99)
Glucose-Capillary: 182 mg/dL — ABNORMAL HIGH (ref 70–99)
Glucose-Capillary: 120 mg/dL — ABNORMAL HIGH (ref 70–99)

## 2014-06-15 NOTE — Progress Notes (Signed)
[facility]                                                                              Patient Demographics  Elizabeth [name], is a 69 y.o. female, DOB - 1945/06/10, PQA:[id number]  Admit date - 06/12/2014   Admitting Physician Rodolph Bong, MD  Outpatient Primary MD for the patient is [name], MD  LOS - 3   Chief Complaint  Patient presents with  . 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 Patient reports taking aspirin but no NSAIDs. Patient denied any prior history of peptic ulcer disease. Patient reported some abdominal pain associated with emesis per patient. Patient reported intermittent dysuria ongoing for months that comes and goes at this site of catheter insertion per patient. Patient denied any hematochezia or melena.  Patient was seen in the emergency room noted to be slightly tachycardic. Gastric occult was positive. Comprehensive metabolic profile with a potassium of 5.4 BUN of 73 creatinine of 4.00 for status of 138 albumin of 2.3 ALT of 36 otherwise was within normal limits. Urinalysis was turbid, nitrite negative, large leukocytes, many yeast, too numerous to count WBCs, 3-6 RBCs, many bacteria, granular casts. CBC done had a white count of 9.4 hemoglobin of 14 platelet count of 168 otherwise was within normal limits. Acute abdominal series which was done showed no evidence of bowel obstruction or ileus. Mild bilateral pulmonary edema was noted with moderate right pleural effusion. Patient was given PPI IV 1. Nephrology was consulted. GI was also consulted per ED physician.    Assessment & Plan     Principal Problem:  hematemesis/coffee-ground emesis - Continue to hold aspirin, denies any NSAID use  - Hemoglobin stable  - Endoscopy 4/22 showed Achalasia, GI following, recommended esophagogram. Even though Dr. Elnoria Howard had recommended possible outpatient study, patient wants to get it done here. NPO after midnight for esophaghagogram in am  ESRD on HD, TTS with hyperkalemia Likely secondary to end-stage renal disease.  Nephrology consulted, patient underwent hemodialysis 4/22, continue per schedule   Nausea or vomiting and diarrhea likely due to achalasia - Currently tolerating full liquid diet, discontinue IV fluids  - esophagram tomorrow   severe protein calorie malnutrition Continue nutritional supplementation.   diabetes type 2 - HbA1c 6.7  Placed on sliding scale insulin q4hrs, Lantus 7 units    status post BKA left lower extremity Stable. PT/OT, follow-up   tachycardia likely due to #1 Continue beta blocker.    hyperlipidemia Continue statin.  Code Status: Full code  Family Communication: Discussed in detail with the patient, all imaging results, lab results explained to the patient    Disposition Plan: pending esophagogram  Time Spent in minutes   25 minutes  Procedures  EGD  Consults   Gastroenterology  DVT Prophylaxis SCD's  Medications  Scheduled Meds: . [START ON 06/19/2014] Darbepoetin Alfa  200 mcg Intravenous Q Thu-HD  . docusate sodium  100 mg Oral BID  . doxercalciferol  1 mcg Intravenous Q T,Th,Sa-HD  . feeding supplement (NEPRO CARB STEADY)  237 mL Oral Q1400  . feeding supplement (PRO-STAT SUGAR FREE 64)  30 mL Oral TID WC  . insulin aspart  0-9 Units Subcutaneous 6 times per day  . insulin glargine  7 Units Subcutaneous Daily  . metoprolol  2.5 mg Intravenous 3 times per day  . multivitamin  1 tablet Oral QHS  . pantoprazole (PROTONIX) IV  40 mg Intravenous Q12H  . pravastatin  20 mg Oral q1800  . saccharomyces boulardii  250 mg  Oral BID  . sevelamer carbonate  800 mg Oral TID WC  . sodium chloride  3 mL Intravenous Q12H  . sodium chloride  3 mL Intravenous Q12H   Continuous Infusions:   PRN Meds:.sodium chloride, acetaminophen **OR** acetaminophen, acetaminophen-codeine, albuterol, HYDROcodone-acetaminophen, ipratropium, loratadine, ondansetron **OR** ondansetron (ZOFRAN) IV, oxyCODONE-acetaminophen, sodium chloride, sorbitol, zolpidem   Antibiotics   Anti-infectives    None        Subjective:   Elizabeth [name] was seen and examined today.  Patient denies dizziness, chest pain, shortness of breath, abdominal pain, N/V/D/C, new weakness, numbess, tingling. Explained about achalasia in detail, patient does not want to have the study outpatient. She is currently tolerating full liquid diet.  Objective:   Blood pressure 125/75, pulse 101, temperature 98.1 F (36.7 C), temperature source Oral, resp. rate 14, height  (1.6 m), weight 37.2 kg (82 lb 0.2 oz), SpO2 97 %.  Wt Readings from Last 3 Encounters:  06/15/14 37.2 kg (82 lb 0.2 oz)  06/03/14 39.463 kg (87 lb)  05/27/14 35.834 kg (79 lb)     Intake/Output Summary (Last 24 hours) at 06/15/14 1209 Last data filed at 06/15/14 0600  Gross per 24 hour  Intake 604.67 ml  Output   1585 ml  Net -980.33 ml    Exam  General: Alert and oriented x 3, NAD  HEENT:  PERRLA, EOMI, Anicteic Sclera, mucous membranes moist.   Neck: Supple, no JVD,   CVS: S1 Sclear, RRR  Chest: CTAB  Abdomen: Soft, nontender, nondistended, normal bowel sounds   Ext: no cyanosis clubbing or edema, left BKA   Neuro: AAOx3, Cr N's II- XII.   Skin: No rashes  Psych: Normal affect and demeanor, Ax Ox 3    Data Review   Micro Results Recent Results (from the past 240 hour(s))  Culture, Urine     Status: None   Collection Time: 06/12/14  1:00 PM  Result Value Ref Range Status   Specimen Description URINE, RANDOM  Final   Special Requests NONE  Final    Colony Count   Final    >=100,000 COLONIES/ML Performed at [facility]    Culture   Final    Multiple bacterial morphotypes present, none predominant. Suggest appropriate recollection if clinically indicated. Performed at [facility]    Report Status 06/13/2014 FINAL  Final  MRSA PCR Screening     Status: None   Collection Time: 06/12/14  7:16 PM  Result Value Ref Range Status   MRSA by PCR NEGATIVE NEGATIVE Final    Comment:        The GeneXpert MRSA Assay (FDA approved for NASAL specimens only), is one component of a comprehensive MRSA colonization surveillance program. It is not intended to diagnose MRSA infection  nor to guide or monitor treatment for MRSA infections.     Radiology Reports Dg Abd Acute W/chest  06/12/2014   CLINICAL DATA:  Acute nausea, vomiting and diarrhea.  EXAM: DG ABDOMEN ACUTE W/ 1V CHEST  COMPARISON:  May 09, 2014.  FINDINGS: There is no evidence of dilated bowel loops or free intraperitoneal air. No radiopaque calculi or other significant radiographic abnormality is seen. Stable cardiomediastinal silhouette. Right internal jugular dialysis catheter is unchanged in position. Moderate right pleural effusion is noted which is increased compared to prior exam. Mild diffuse interstitial densities are noted consistent with pulmonary edema.  IMPRESSION: No evidence of bowel obstruction or ileus. Mild bilateral pulmonary edema is noted with moderate right pleural effusion.   Electronically Signed   By: Lupita Raider, M.D.   On: 06/12/2014 13:55    CBC  Recent Labs Lab 06/12/14 1217  06/13/14 0531 06/13/14 1315 06/13/14 2113 06/14/14 0533 06/14/14 1015  WBC 9.4  < > 9.2 7.1 5.6 5.3 4.8  HGB 14.0  < > 11.7* 11.6* 12.6 12.0 11.8*  HCT 44.4  < > 37.0 36.5 39.6 38.2 38.1  PLT 168  < > 141* 151 PLATELET CLUMPS NOTED ON SMEAR, UNABLE TO ESTIMATE 119* 139*  MCV 83.5  < > 82.2 81.1 82.8 82.2 82.5  MCH 26.3  < > 26.0 25.8* 26.4 25.8* 25.5*   MCHC 31.5  < > 31.6 31.8 31.8 31.4 31.0  RDW 19.0*  < > 18.7* 18.6* 18.8* 18.7* 18.6*  LYMPHSABS 0.6*  --   --   --   --   --   --   MONOABS 0.5  --   --   --   --   --   --   EOSABS 0.1  --   --   --   --   --   --   BASOSABS 0.0  --   --   --   --   --   --   < > = values in this interval not displayed.  Chemistries   Recent Labs Lab 06/12/14 1541 06/13/14 0531 06/14/14 1015  NA 136 136 135  K 5.4* 5.4* 4.2  CL 101 99 99  CO2 24 24 27   GLUCOSE 172* 245* 166*  BUN 73* 85* 32*  CREATININE 4.00* 4.72* 2.99*  CALCIUM 8.1* 8.5 8.2*  MG  --  2.2  --   AST 31 28  --   ALT 36* 33  --   ALKPHOS 138* 132*  --   BILITOT 0.6 0.4  --    ------------------------------------------------------------------------------------------------------------------ estimated creatinine clearance is 10.3 mL/min (by C-G formula based on Cr of 2.99). ------------------------------------------------------------------------------------------------------------------  Recent Labs  06/12/14 1217 06/12/14 2143  HGBA1C 6.5* 6.7*   ------------------------------------------------------------------------------------------------------------------ No results for input(s): CHOL, HDL, LDLCALC, TRIG, CHOLHDL, LDLDIRECT in the last 72 hours. ------------------------------------------------------------------------------------------------------------------ No results for input(s): TSH, T4TOTAL, T3FREE, THYROIDAB in the last 72 hours.  Invalid input(s): FREET3 ------------------------------------------------------------------------------------------------------------------ No results for input(s): VITAMINB12, FOLATE, FERRITIN, TIBC, IRON, RETICCTPCT in the last 72 hours.  Coagulation profile  Recent Labs Lab 06/12/14 2143  INR 1.15    No results for input(s): DDIMER in the last 72 hours.  Cardiac Enzymes No results for input(s): CKMB, TROPONINI, MYOGLOBIN in the last 168 hours.  Invalid input(s):  CK ------------------------------------------------------------------------------------------------------------------ Invalid input(s): POCBNP   Recent Labs  06/14/14 1606 06/14/14 2035 06/14/14 2350 06/15/14 0348 06/15/14 0805 06/15/14 1143  GLUCAP 124* 164* 169* 176* 194* 182*  Elizabeth [name] M.D. [facility] 06/15/2014, 12:09 PM  Pager: [phone]   Between 7am to 7pm - call Pager - [phone]  After 7pm go to [email or web address] - password [email or web address]  Call night coverage person covering after 7pm

## 2014-06-15 NOTE — Progress Notes (Signed)
   Daily Progress Note  Assessment/Planning: S/p L BC AVF   Maturation of L BC AVF confirmed on duplex today  May start using L BC AVF  Subjective  - 2 Days Post-Op  No complaints  Objective Filed Vitals:   06/14/14 1609 06/14/14 2100 06/15/14 0500 06/15/14 0531  BP: 97/42 119/60  125/75  Pulse: 97 86  101  Temp: 98.2 F (36.8 C) 98.1 F (36.7 C)  98.1 F (36.7 C)  TempSrc: Oral   Oral  Resp: 16 16  14   Height:      Weight:   82 lb 0.2 oz (37.2 kg)   SpO2: 100% 99%  97%    Intake/Output Summary (Last 24 hours) at 06/15/14 0849 Last data filed at 06/15/14 0600  Gross per 24 hour  Intake 604.67 ml  Output   1735 ml  Net -1130.33 ml    VASC  Visible L BC AVF with good thrill and bruit, Hand grip 5/5, cool hands B, no palpable radial pulse  Laboratory CBC    Component Value Date/Time   WBC 4.8 06/14/2014 1015   HGB 11.8* 06/14/2014 1015   HCT 38.1 06/14/2014 1015   PLT 139* 06/14/2014 1015    BMET    Component Value Date/Time   NA 135 06/14/2014 1015   K 4.2 06/14/2014 1015   CL 99 06/14/2014 1015   CO2 27 06/14/2014 1015   GLUCOSE 166* 06/14/2014 1015   BUN 32* 06/14/2014 1015   CREATININE 2.99* 06/14/2014 1015   CALCIUM 8.2* 06/14/2014 1015   GFRNONAA 15* 06/14/2014 1015   GFRAA 17* 06/14/2014 1015    Leonides Sake, MD [facility] Office: [phone] Pager: [phone]  06/15/2014, 8:49 AM

## 2014-06-15 NOTE — Progress Notes (Signed)
[name] Progress Note  Assessment/Plan: 1. Hematemesis - no acute drop in Hgb, heparin held initially, can use low dose I think. Endo by Dr. Elnoria Howard showed ? Achalasia, for esophagram. 2. ESRD - TTS HD.  AVF ok to use per Dr. Imogene Burn - will start per protocol on 4/26- duplex showed adequate maturation. Next HD Tues 3. BP/volume - pulmonary edema on initial CXR, down now 41 > 36 kg yest. Asymptomatic 4. Anemia - Hgb 11.8 - no ESA for now 5. Metabolic bone disease - Continue Hectorol +renvela 6. PCMalnutrition - RD consult - needs small freq feedings on FL- needs strict fluid restriction given small size and low EF, makes UF difficult 7. Thrombocytopenia - mild - watch 8. DM - per primary  Elizabeth [name] pager [phone]    cell [phone] 06/15/2014, 11:04 AM    Subjective:   abd pain better, nausea better.   Objective Filed Vitals:   06/14/14 1609 06/14/14 2100 06/15/14 0500 06/15/14 0531  BP: 97/42 119/60  125/75  Pulse: 97 86  101  Temp: 98.2 F (36.8 C) 98.1 F (36.7 C)  98.1 F (36.7 C)  TempSrc: Oral   Oral  Resp: Height:      Weight:   37.2 kg (82 lb 0.2 oz)   SpO2: 100% 99%  97%   Physical Exam General: NAD supine in bed Heart: RRR - tachy 90 - 100 Lungs: occ crackles at bases Abdomen: soft NT Extremities: left BKA, no LE edema Dialysis Access: right IJ and left upper AVF + bruit  Dialysis: [facility] TTS  3.5 hr    35kg   2K/2.25 bath   P4 temp 36 left upper AVF 16 ga 300/ 800 heparin 3700 hectorol 1  no ESA or Fe - Last received 200 Aranesp 3/31   Additional Objective Labs: Basic Metabolic Panel:  Recent Labs Lab 06/12/14 1541 06/13/14 0531 06/14/14 1015  NA 136 136 135  K 5.4* 5.4* 4.2  CL 101 99 99  CO2 GLUCOSE 172* 245* 166*  BUN 73* 85* 32*  CREATININE 4.00* 4.72* 2.99*  CALCIUM 8.1* 8.5 8.2*  PHOS  --   --  4.5   Liver Function Tests:  Recent Labs Lab 06/12/14 1541 06/13/14 0531 06/14/14 1015   AST 31 28  --   ALT 36* 33  --   ALKPHOS 138* 132*  --   BILITOT 0.6 0.4  --   PROT 6.2 6.4  --   ALBUMIN 2.3* 2.2* 2.2*   CBC:  Recent Labs Lab 06/12/14 1217  06/13/14 0531 06/13/14 1315 06/13/14 2113 06/14/14 0533 06/14/14 1015  WBC 9.4  < > 9.2 7.1 5.6 5.3 4.8  NEUTROABS 8.3*  --   --   --   --   --   --   HGB 14.0  < > 11.7* 11.6* 12.6 12.0 11.8*  HCT 44.4  < > 37.0 36.5 39.6 38.2 38.1  MCV 83.5  < > 82.2 81.1 82.8 82.2 82.5  PLT 168  < > 141* 151 PLATELET CLUMPS NOTED ON SMEAR, UNABLE TO ESTIMATE 119* 139*  < > = values in this interval not displayed. Blood Culture    Component Value Date/Time   SDES URINE, RANDOM 06/12/2014 1300   SPECREQUEST NONE 06/12/2014 1300   CULT  06/12/2014 1300    Multiple bacterial morphotypes present, none predominant. Suggest appropriate recollection if clinically indicated. Performed at [facility]  REPTSTATUS 06/13/2014 FINAL 06/12/2014 1300   CBG:  Recent Labs Lab 06/14/14 1606 06/14/14 2035 06/14/14 2350 06/15/14 0348 06/15/14 0805  GLUCAP 124* 164* 169* 176* 194*  Medications:   . [START ON 06/19/2014] Darbepoetin Alfa  200 mcg Intravenous Q Thu-HD  . docusate sodium  100 mg Oral BID  . doxercalciferol  1 mcg Intravenous Q T,Th,Sa-HD  . feeding supplement (NEPRO CARB STEADY)  237 mL Oral Q1400  . feeding supplement (PRO-STAT SUGAR FREE 64)  30 mL Oral TID WC  . insulin aspart  0-9 Units Subcutaneous 6 times per day  . insulin glargine  7 Units Subcutaneous Daily  . metoprolol  2.5 mg Intravenous 3 times per day  . multivitamin  1 tablet Oral QHS  . pantoprazole (PROTONIX) IV  40 mg Intravenous Q12H  . pravastatin  20 mg Oral q1800  . saccharomyces boulardii  250 mg Oral BID  . sevelamer carbonate  800 mg Oral TID WC  . sodium chloride  3 mL Intravenous Q12H  . sodium chloride  3 mL Intravenous Q12H

## 2014-06-16 ENCOUNTER — Inpatient Hospital Stay (HOSPITAL_COMMUNITY): Payer: Medicare Other

## 2014-06-16 LAB — GLUCOSE, CAPILLARY
GLUCOSE-CAPILLARY: 102 mg/dL — AB (ref 70–99)
GLUCOSE-CAPILLARY: 87 mg/dL (ref 70–99)
Glucose-Capillary: 101 mg/dL — ABNORMAL HIGH (ref 70–99)
Glucose-Capillary: 105 mg/dL — ABNORMAL HIGH (ref 70–99)
Glucose-Capillary: 117 mg/dL — ABNORMAL HIGH (ref 70–99)

## 2014-06-16 MED ORDER — INSULIN ASPART 100 UNIT/ML ~~LOC~~ SOLN: 0.0000 [IU] | Freq: Three times a day (TID) | SUBCUTANEOUS | Status: DC

## 2014-06-16 MED ORDER — OXYCODONE-ACETAMINOPHEN 5-325 MG PO TABS
1.0000 | ORAL_TABLET | Freq: Three times a day (TID) | ORAL | Status: DC | PRN
Start: 2014-06-16 — End: 2014-06-28

## 2014-06-16 NOTE — Progress Notes (Signed)
Subjective:  "I'm hungry but need that esophogram " HD for am to use AVF 1st use  Objective Vital signs in last 24 hours: Filed Vitals:   06/15/14 1424 06/15/14 2306 06/16/14 0051 06/16/14 0528  BP: 102/70 102/67 104/67 103/65  Pulse: 101 103 97 98  Temp: 98.3 F (36.8 C) 97.7 F (36.5 C)  97.5 F (36.4 C)  TempSrc: Oral Oral  Oral  Resp: Height:      Weight:    37.5 kg (82 lb 10.8 oz)  SpO2: 99% 98%  98%   Weight change: -0.1 kg (-3.5 oz)  Physical Exam: General: alert /  NAD / Appropriate  Heart: RRR  Soft 1/6 sem  lsb , no rub Lungs:  decrs bs at base otherwise CTA Abdomen: BS  = nl , ND/ soft NT Extremities: left BKA, no LE edema Dialysis Access: right IJ and left upper Arm AVF + bruit   OP Dialysis: [facility] TTS  3.5 hr 35kg 2K/2.25 bath P4 temp 36 left upper AVF 16 ga 300/ 800 heparin 3700 hectorol 1 no ESA or Fe - Last received 200 Aranesp 3/31  Problem/Plan: 1. Hematemesis - HGB 11.8/ no acute drop in Hgb, heparin held initially /will  use low dose /. Endo by Dr. Elnoria Howard showed ? Achalasia, this am  Esophagram pending  2. ESRD - TTS HD. AVF ok to use per Dr. Imogene Burn - will start per protocol  In  Am Tues  4/26- duplex showed adequate maturation.  3. BP/volume - pulmonary edema/ mod  Pl effusion /  on initial CXR,  Wt down now 41 > 36 kg yest. Asymptomatic ho low ef  Am bp 103/65 stable for her /  4. Anemia - Hgb 11.8 - no ESA for now 5. Metabolic bone disease - Continue Hectorol +renvela 6. PCMalnutrition -  ALB 2.2 ,RD consult - needs small freq feedings on FL-with  low EF=  needing  strict fluid restriction given  Her small size  7. DM - per primary  Lenny Pastel, PA-C [facility] Beeper [phone] 06/16/2014,9:35 AM  LOS: 4 days   Labs: Basic Metabolic Panel:  Recent Labs Lab 06/12/14 1541 06/13/14 0531 06/14/14 1015  NA 136 136 135  K 5.4* 5.4* 4.2  CL 101 99 99  CO2 GLUCOSE 172* 245* 166*  BUN 73* 85* 32*   CREATININE 4.00* 4.72* 2.99*  CALCIUM 8.1* 8.5 8.2*  PHOS  --   --  4.5   Liver Function Tests:  Recent Labs Lab 06/12/14 1541 06/13/14 0531 06/14/14 1015  AST 31 28  --   ALT 36* 33  --   ALKPHOS 138* 132*  --   BILITOT 0.6 0.4  --   PROT 6.2 6.4  --   ALBUMIN 2.3* 2.2* 2.2*   CBC:  Recent Labs Lab 06/12/14 1217  06/13/14 0531 06/13/14 1315 06/13/14 2113 06/14/14 0533 06/14/14 1015  WBC 9.4  < > 9.2 7.1 5.6 5.3 4.8  NEUTROABS 8.3*  --   --   --   --   --   --   HGB 14.0  < > 11.7* 11.6* 12.6 12.0 11.8*  HCT 44.4  < > 37.0 36.5 39.6 38.2 38.1  MCV 83.5  < > 82.2 81.1 82.8 82.2 82.5  PLT 168  < > 141* 151 PLATELET CLUMPS NOTED ON SMEAR, UNABLE TO ESTIMATE 119* 139*  < > = values in this interval not displayed.  Cardiac Enzymes: No results for input(s): CKTOTAL, CKMB, CKMBINDEX, TROPONINI in the last 168 hours. CBG:  Recent Labs Lab 06/15/14 1648 06/15/14 2035 06/16/14 0017 06/16/14 0415 06/16/14 0743  GLUCAP 120* 151* 117* 101* 102*    Studies/Results: No results found. Medications:   . docusate sodium  100 mg Oral BID  . doxercalciferol  1 mcg Intravenous Q T,Th,Sa-HD  . feeding supplement (NEPRO CARB STEADY)  237 mL Oral Q1400  . feeding supplement (PRO-STAT SUGAR FREE 64)  30 mL Oral TID WC  . insulin aspart  0-9 Units Subcutaneous 6 times per day  . insulin glargine  7 Units Subcutaneous Daily  . metoprolol  2.5 mg Intravenous 3 times per day  . multivitamin  1 tablet Oral QHS  . pantoprazole (PROTONIX) IV  40 mg Intravenous Q12H  . pravastatin  20 mg Oral q1800  . saccharomyces boulardii  250 mg Oral BID  . sevelamer carbonate  800 mg Oral TID WC  . sodium chloride  3 mL Intravenous Q12H  . sodium chloride  3 mL Intravenous Q12H

## 2014-06-16 NOTE — Care Management Note (Unsigned)
    Page 1 of 1   06/16/2014     12:50:08 PM CARE MANAGEMENT NOTE 06/16/2014  Patient:  Elizabeth [name],Elizabeth [name]   Account Number:  [id number]  Date Initiated:  06/16/2014  Documentation initiated by:  Letha Cape  Subjective/Objective Assessment:   dx hematemesis  admit- from [facility]     Action/Plan:   Anticipated DC Date:  06/17/2014   Anticipated DC Plan:  SKILLED NURSING FACILITY  In-house referral  Clinical Social Worker      DC Planning Services  CM consult      Choice offered to / List presented to:             Status of service:  In process, will continue to follow Medicare Important Message given?  YES (If response is "NO", the following Medicare IM given date fields will be blank) Date Medicare IM given:  06/16/2014 Medicare IM given by:  Letha Cape Date Additional Medicare IM given:   Additional Medicare IM given by:    Discharge Disposition:    Per UR Regulation:    If discussed at Long Length of Stay Meetings, dates discussed:    Comments:

## 2014-06-16 NOTE — Progress Notes (Signed)
Report called to [facility], answered all question, pt stable, no complaints or concerns stated

## 2014-06-16 NOTE — Clinical Documentation Improvement (Signed)
  Patient admitted with "hematemesis", also with N/V/D. EGD showed Achalasia and Notes link N/V/D with Achalasia. Please clarify if there is a relationship between the "hematemesis" and Achalasia.  Thank you,  Beverley Fiedler ,RN Clinical Documentation Specialist:  [phone]  [facility]- Health Information Management

## 2014-06-16 NOTE — Discharge Summary (Signed)
Physician Discharge Summary   Patient ID: Elizabeth [name] MRN: [id number] DOB/AGE: 1946/02/21 69 y.o.  Admit date: 06/12/2014 Discharge date: 06/16/2014  Primary Care Physician:  [name], MD  Discharge Diagnoses:    . Hematemesis  Achalasia- new diagnosis . HLD (hyperlipidemia) . Anemia of chronic disease . End stage renal disease-on HD . Acute hyperkalemia . Protein-calorie malnutrition, severe . Peripheral artery disease   Consults:  Gastroenterology Nephrology   Recommendations for Outpatient Follow-up:  Please make a follow-up appointment with Dr. Elnoria Howard, gastroenterology in 2 weeks  Aspirin has been discontinued  DIET: Full liquid diet or very soft solids, thoroughly chew and small bites    Allergies:  No Known Allergies   Discharge Medications:   Medication List    STOP taking these medications        acetaminophen-codeine 300-30 MG per tablet  Commonly known as:  TYLENOL #3     aspirin EC 81 MG tablet      TAKE these medications        carvedilol 6.25 MG tablet  Commonly known as:  COREG  take 1 tablet by mouth twice a day     cetirizine 10 MG tablet  Commonly known as:  ZYRTEC  Take 10 mg by mouth as needed for allergies.     Darbepoetin Alfa 200 MCG/0.4ML Sosy injection  Commonly known as:  ARANESP  Inject 0.4 mLs (200 mcg total) into the vein every Thursday with hemodialysis.     DECUBI-VITE Caps  Take 2 capsules by mouth daily.     docusate sodium 100 MG capsule  Commonly known as:  COLACE  Take 1 capsule (100 mg total) by mouth daily.     doxercalciferol 4 MCG/2ML injection  Commonly known as:  HECTOROL  Inject 0.5 mLs (1 mcg total) into the vein Every Tuesday,Thursday,and Saturday with dialysis.     feeding supplement (NEPRO CARB STEADY) Liqd  Take 237 mLs by mouth daily at 2 PM daily at 2 PM.     feeding supplement (PRO-STAT SUGAR FREE 64) Liqd  Take 30 mLs by mouth 3 (three) times daily with meals.     glucose blood test  strip  Commonly known as:  ONETOUCH VERIO  Use as instructed to check blood sugar 4 times per day dx code E13.10     HYDROGEL Gel  Apply 1 application topically as needed (for sacrum).     insulin aspart 100 UNIT/ML injection  Commonly known as:  novoLOG  Inject 0-9 Units into the skin 3 (three) times daily with meals. Sliding scale CBG 70 - 120: 0 units CBG 121 - 150: 1 unit,  CBG 151 - 200: 2 units,  CBG 201 - 250: 3 units,  CBG 251 - 300: 5 units,  CBG 301 - 350: 7 units,  CBG 351 - 400: 9 units   CBG > 400: 9 units and notify your MD     insulin glargine 100 UNIT/ML injection  Commonly known as:  LANTUS  Inject 0.07 mLs (7 Units total) into the skin daily at 10 pm.     loratadine 10 MG tablet  Commonly known as:  CLARITIN  Take 10 mg by mouth daily as needed for allergies.     ONETOUCH DELICA LANCETS FINE Misc  Use to check blood sugar 4 times per day     oxyCODONE-acetaminophen 5-325 MG per tablet  Commonly known as:  PERCOCET/ROXICET  Take 1 tablet by mouth every 8 (eight) hours as needed for moderate pain  or severe pain.     pantoprazole 40 MG tablet  Commonly known as:  PROTONIX  Take 1 tablet (40 mg total) by mouth daily.     pravastatin 20 MG tablet  Commonly known as:  PRAVACHOL  Take 20 mg by mouth daily.     saccharomyces boulardii 250 MG capsule  Commonly known as:  FLORASTOR  Take 1 capsule (250 mg total) by mouth 2 (two) times daily.     sevelamer carbonate 800 MG tablet  Commonly known as:  RENVELA  Take 1 tablet (800 mg total) by mouth 3 (three) times daily with meals.         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 Patient reports taking aspirin but no NSAIDs. Patient denied any prior history of peptic ulcer disease. Patient reported some abdominal pain associated with emesis per patient. Patient reported intermittent dysuria ongoing for months that comes and goes at this site of catheter insertion per patient. Patient denied any hematochezia or melena.  Patient was seen in the emergency room noted to be slightly tachycardic. Gastric occult was positive. Comprehensive metabolic profile with a potassium of 5.4 BUN of 73 creatinine of 4.00 for status of 138 albumin of 2.3 ALT of 36 otherwise was within normal limits. Urinalysis was turbid, nitrite negative, large leukocytes, many yeast, too numerous to count WBCs, 3-6 RBCs, many bacteria, granular casts. CBC done had a white count of 9.4 hemoglobin of 14 platelet count of 168 otherwise was within normal limits. Acute abdominal series which was done showed no evidence of bowel obstruction or ileus. Mild bilateral pulmonary edema was noted with moderate right pleural effusion. Patient was given PPI IV 1. Nephrology was consulted. GI was also consulted per ED physician.  Hospital Course:   hematemesis/coffee-ground emesis, upper GI bleed- likely due to gastritis/esophagitis resolved Aspirin was placed on hold. Hemoglobin remained stable. Gastroenterology was consulted, patient was seen by Dr. Elnoria Howard. Patient underwent endoscopy on 4/22 which showed Achalasia, GI following, recommended esophagogram.  Esophagogram showed long segment narrowing/stricture of the distal esophagus, associated dilatation of the proximal/mid esophagus with esophageal dysmotility suggesting aclasia. This was discussed in detail with Dr. Elnoria Howard. Dr. Elnoria Howard recommended for now full liquid diet or soft solids, small bites and thoroughly chew. He will follow up patient in 10-14 days for further workup. Please make the appointment with Dr.  Elnoria Howard, gastroenterology.  Continue Protonix.  ESRD on HD, TTS with hyperkalemia Likely secondary to end-stage renal disease. Nephrology was consulted, patient underwent hemodialysis 4/22, continue per schedule   Nausea or vomiting  likely due to achalasia Resolved, continue full liquid dietor very soft solid diet, small bites and thoroughly chew, follow outpatient with GI.  severe protein calorie malnutrition Continue nutritional supplementation.  diabetes type 2 - HbA1c 6.7 , continue sliding scale insulin  ESRD on hemodialysis - Patient will need dialysis tomorrow per her schedule  status post BKA left lower extremity Stable. PT/OT, follow-up   tachycardia likely due to #1 Continue beta blocker.   hyperlipidemia Continue statin.   Day of Discharge BP 119/78 mmHg  Pulse 100  Temp(Src) 97.6 F (  36.4 C) (Oral)  Resp 16  Ht  (1.6 m)  Wt 37.5 kg (82 lb 10.8 oz)  BMI 14.65 kg/m2  SpO2 99%  Physical Exam: General: Alert and awake oriented x3 not in any acute distress. HEENT: anicteric sclera, pupils reactive to light and accommodation CVS: S1-S2 clear no murmur rubs or gallops Chest: clear to auscultation bilaterally, no wheezing rales or rhonchi Abdomen: soft nontender, nondistended, normal bowel sounds Extremities: no cyanosis, clubbing or edema, left BKA Neuro: Cranial nerves II-XII intact, no focal neurological deficits   The results of significant diagnostics from this hospitalization (including imaging, microbiology, ancillary and laboratory) are listed below for reference.    LAB RESULTS: Basic Metabolic Panel:  Recent Labs Lab 06/13/14 0531 06/14/14 1015  NA 136 135  K 5.4* 4.2  CL 99 99  CO2 24 27  GLUCOSE 245* 166*  BUN 85* 32*  CREATININE 4.72* 2.99*  CALCIUM 8.5 8.2*  MG 2.2  --   PHOS  --  4.5   Liver Function Tests:  Recent Labs Lab 06/12/14 1541 06/13/14 0531 06/14/14 1015  AST 31 28  --   ALT 36* 33  --   ALKPHOS 138*  132*  --   BILITOT 0.6 0.4  --   PROT 6.2 6.4  --   ALBUMIN 2.3* 2.2* 2.2*   No results for input(s): LIPASE, AMYLASE in the last 168 hours. No results for input(s): AMMONIA in the last 168 hours. CBC:  Recent Labs Lab 06/12/14 1217  06/14/14 0533 06/14/14 1015  WBC 9.4  < > 5.3 4.8  NEUTROABS 8.3*  --   --   --   HGB 14.0  < > 12.0 11.8*  HCT 44.4  < > 38.2 38.1  MCV 83.5  < > 82.2 82.5  PLT 168  < > 119* 139*  < > = values in this interval not displayed. Cardiac Enzymes: No results for input(s): CKTOTAL, CKMB, CKMBINDEX, TROPONINI in the last 168 hours. BNP: Invalid input(s): POCBNP CBG:  Recent Labs Lab 06/16/14 0743 06/16/14 1152  GLUCAP 102* 87    Significant Diagnostic Studies:  Dg Abd Acute W/chest  06/12/2014   CLINICAL DATA:  Acute nausea, vomiting and diarrhea.  EXAM: DG ABDOMEN ACUTE W/ 1V CHEST  COMPARISON:  May 09, 2014.  FINDINGS: There is no evidence of dilated bowel loops or free intraperitoneal air. No radiopaque calculi or other significant radiographic abnormality is seen. Stable cardiomediastinal silhouette. Right internal jugular dialysis catheter is unchanged in position. Moderate right pleural effusion is noted which is increased compared to prior exam. Mild diffuse interstitial densities are noted consistent with pulmonary edema.  IMPRESSION: No evidence of bowel obstruction or ileus. Mild bilateral pulmonary edema is noted with moderate right pleural effusion.   Electronically Signed   By: Lupita Raider, M.D.   On: 06/12/2014 13:55    2D ECHO:   Disposition and Follow-up:    DISPOSITION: Skilled nursing facility  DISCHARGE FOLLOW-UP Follow-up Information    Follow up with HUNG,PATRICK D, MD. Schedule an appointment as soon as possible for a visit in 2 weeks.   Specialty:  Gastroenterology   Why:  for hospital follow-up   Contact information:   [address] [place] [address] [phone]       Follow up with  [facility], MD. Schedule an appointment as soon as possible for a visit in 2 weeks.   Specialty:  Endocrinology   Why:  for hospital follow-up  Contact information:   [address] [place] [address] [phone]        Time spent on Discharge: 40 minutes  Signed:   RAI,RIPUDEEP M.D. [facility] 06/16/2014, 2:09 PM Pager: [phone]

## 2014-06-16 NOTE — Progress Notes (Signed)
PT Cancellation Note  Patient Details Name: Elizabeth [name] MRN: [id number] DOB: [birth year]   Cancelled Treatment:    Reason Eval/Treat Not Completed: Patient declined, no reason specified;Other (comment) (States she is leaving imminently and will wait for SNF)   Ivar Drape 06/16/2014, 5:03 PM   Samul Dada, PT MS Acute Rehab Dept. Number: [phone]

## 2014-06-16 NOTE — Progress Notes (Signed)
[facility]                                                                              Patient Demographics  Elizabeth [name], is a 69 y.o. female, DOB - 04/27/1945, ZOX:[id number]  Admit date - 06/12/2014   Admitting Physician Rodolph Bong, MD  Outpatient Primary MD for the patient is [name], MD  LOS - 4   Chief Complaint  Patient presents with  . 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 Patient reports taking aspirin but no NSAIDs. Patient denied any prior history of peptic ulcer disease. Patient reported some abdominal pain associated with emesis per patient. Patient reported intermittent dysuria ongoing for months that comes and goes at this site of catheter insertion per patient. Patient denied any hematochezia or melena.  Patient was seen in the emergency room noted to be slightly tachycardic. Gastric occult was positive. Comprehensive metabolic profile with a potassium of 5.4 BUN of 73 creatinine of 4.00 for status of 138 albumin of 2.3 ALT of 36 otherwise was within normal limits. Urinalysis was turbid, nitrite negative, large leukocytes, many yeast, too numerous to count WBCs, 3-6 RBCs, many bacteria, granular casts. CBC done had a white count of 9.4 hemoglobin of 14 platelet count of 168 otherwise was within normal limits. Acute abdominal series which was done showed no evidence of bowel obstruction or ileus. Mild bilateral pulmonary edema was noted with moderate right pleural effusion. Patient was given PPI IV 1. Nephrology was consulted. GI was also consulted per ED physician.    Assessment & Plan     Principal Problem:  hematemesis/coffee-ground emesis - Continue to hold aspirin, denies any NSAID use  - Hemoglobin stable  - Endoscopy 4/22 showed Achalasia, GI following, recommended esophagogram.  - Per patient's request, esophagogram to be done inpatient, pending today. Following the results, will discuss with gastroenterology for further management.   ESRD on HD, TTS with hyperkalemia Likely secondary to end-stage renal disease.  Nephrology consulted, patient underwent hemodialysis 4/22, continue per schedule   Nausea or vomiting and diarrhea likely due to achalasia - nothing by mouth for the test, will restart full liquid diet after the test   severe protein calorie malnutrition Continue nutritional supplementation.   diabetes type 2 - HbA1c 6.7  Placed on sliding scale insulin q4hrs, hold Lantus while nothing by mouth   status post BKA left lower extremity Stable. PT/OT, follow-up   tachycardia likely due to #1 Continue beta blocker.    hyperlipidemia Continue statin.  Code Status: Full code  Family Communication: Discussed in detail with the patient, all imaging results, lab results explained to the patient    Disposition Plan: pending esophagogram, further management depending on the results  Time Spent in minutes   25 minutes  Procedures  EGD  Consults   Gastroenterology  DVT Prophylaxis SCD's  Medications  Scheduled Meds: . docusate sodium  100 mg Oral BID  . doxercalciferol  1 mcg Intravenous Q T,Th,Sa-HD  . feeding supplement (NEPRO CARB STEADY)  237 mL Oral Q1400  . feeding supplement (PRO-STAT SUGAR FREE 64)  30 mL Oral TID WC  . insulin aspart  0-9 Units Subcutaneous 6 times per day  . insulin glargine  7 Units Subcutaneous Daily  . metoprolol  2.5 mg Intravenous 3 times per day  . multivitamin  1 tablet Oral QHS  . pantoprazole (PROTONIX) IV  40 mg Intravenous Q12H  . pravastatin  20 mg Oral q1800  . saccharomyces boulardii  250 mg  Oral BID  . sevelamer carbonate  800 mg Oral TID WC  . sodium chloride  3 mL Intravenous Q12H  . sodium chloride  3 mL Intravenous Q12H   Continuous Infusions:   PRN Meds:.sodium chloride, acetaminophen **OR** acetaminophen, acetaminophen-codeine, albuterol, HYDROcodone-acetaminophen, ipratropium, loratadine, ondansetron **OR** ondansetron (ZOFRAN) IV, oxyCODONE-acetaminophen, sodium chloride, sorbitol, zolpidem   Antibiotics   Anti-infectives    None        Subjective:   Porche Wrigley was seen and examined today. Feels hungry, wants to eat otherwise denies any nausea or vomiting or abdominal pain. She was tolerating full liquid diet. Patient denies dizziness, chest pain, shortness of breath, abdominal pain, N/V/D/C, new weakness, numbess, tingling.  Objective:   Blood pressure 103/65, pulse 98, temperature 97.5 F (36.4 C), temperature source Oral, resp. rate 18, height 5\' 3"  (1.6 m), weight 37.5 kg (82 lb 10.8 oz), SpO2 98 %.  Wt Readings from Last 3 Encounters:  06/16/14 37.5 kg (82 lb 10.8 oz)  06/03/14 39.463 kg (87 lb)  05/27/14 35.834 kg (79 lb)     Intake/Output Summary (Last 24 hours) at 06/16/14 1224 Last data filed at 06/16/14 1026  Gross per 24 hour  Intake    598 ml  Output    325 ml  Net    273 ml    Exam  General: Alert and oriented x 3, NAD  HEENT:  PERRLA, EOMI,   Neck: Supple, no JVD,   CVS: S1 S2 clear, regular rate and rhythm, no MRG   Chest:Clear to auscultation bilaterally   Abdomen: Soft, nontender, nondistended, normal bowel sounds   Ext: no cyanosis clubbing or edema, left BKA   Neuro: AAOx3, Cr N's II- XII.   Skin: No rashes  Psych: Normal affect and demeanor, Ax Ox 3    Data Review   Micro Results Recent Results (from the past 240 hour(s))  Culture, Urine     Status: None   Collection Time: 06/12/14  1:00 PM  Result Value Ref Range Status   Specimen Description URINE, RANDOM  Final   Special Requests NONE  Final    Colony Count   Final    >=100,000 COLONIES/ML Performed at [facility]    Culture   Final    Multiple bacterial morphotypes present, none predominant. Suggest appropriate recollection if clinically indicated. Performed at [facility]    Report Status 06/13/2014 FINAL  Final  MRSA PCR Screening     Status: None   Collection Time: 06/12/14  7:16 PM  Result Value Ref Range Status   MRSA by PCR NEGATIVE NEGATIVE Final    Comment:        The GeneXpert MRSA Assay (FDA approved for NASAL specimens only), is one component of a comprehensive MRSA colonization surveillance program.  It is not intended to diagnose MRSA infection nor to guide or monitor treatment for MRSA infections.     Radiology Reports Dg Abd Acute W/chest  06/12/2014   CLINICAL DATA:  Acute nausea, vomiting and diarrhea.  EXAM: DG ABDOMEN ACUTE W/ 1V CHEST  COMPARISON:  May 09, 2014.  FINDINGS: There is no evidence of dilated bowel loops or free intraperitoneal air. No radiopaque calculi or other significant radiographic abnormality is seen. Stable cardiomediastinal silhouette. Right internal jugular dialysis catheter is unchanged in position. Moderate right pleural effusion is noted which is increased compared to prior exam. Mild diffuse interstitial densities are noted consistent with pulmonary edema.  IMPRESSION: No evidence of bowel obstruction or ileus. Mild bilateral pulmonary edema is noted with moderate right pleural effusion.   Electronically Signed   By: Lupita Raider, M.D.   On: 06/12/2014 13:55    CBC  Recent Labs Lab 06/12/14 1217  06/13/14 0531 06/13/14 1315 06/13/14 2113 06/14/14 0533 06/14/14 1015  WBC 9.4  < > 9.2 7.1 5.6 5.3 4.8  HGB 14.0  < > 11.7* 11.6* 12.6 12.0 11.8*  HCT 44.4  < > 37.0 36.5 39.6 38.2 38.1  PLT 168  < > 141* 151 PLATELET CLUMPS NOTED ON SMEAR, UNABLE TO ESTIMATE 119* 139*  MCV 83.5  < > 82.2 81.1 82.8 82.2 82.5  MCH 26.3  < > 26.0 25.8* 26.4 25.8* 25.5*   MCHC 31.5  < > 31.6 31.8 31.8 31.4 31.0  RDW 19.0*  < > 18.7* 18.6* 18.8* 18.7* 18.6*  LYMPHSABS 0.6*  --   --   --   --   --   --   MONOABS 0.5  --   --   --   --   --   --   EOSABS 0.1  --   --   --   --   --   --   BASOSABS 0.0  --   --   --   --   --   --   < > = values in this interval not displayed.  Chemistries   Recent Labs Lab 06/12/14 1541 06/13/14 0531 06/14/14 1015  NA 136 136 135  K 5.4* 5.4* 4.2  CL 101 99 99  CO2 GLUCOSE 172* 245* 166*  BUN 73* 85* 32*  CREATININE 4.00* 4.72* 2.99*  CALCIUM 8.1* 8.5 8.2*  MG  --  2.2  --   AST 31 28  --   ALT 36* 33  --   ALKPHOS 138* 132*  --   BILITOT 0.6 0.4  --    ------------------------------------------------------------------------------------------------------------------ estimated creatinine clearance is 10.4 mL/min (by C-G formula based on Cr of 2.99). ------------------------------------------------------------------------------------------------------------------ No results for input(s): HGBA1C in the last 72 hours. ------------------------------------------------------------------------------------------------------------------ No results for input(s): CHOL, HDL, LDLCALC, TRIG, CHOLHDL, LDLDIRECT in the last 72 hours. ------------------------------------------------------------------------------------------------------------------ No results for input(s): TSH, T4TOTAL, T3FREE, THYROIDAB in the last 72 hours.  Invalid input(s): FREET3 ------------------------------------------------------------------------------------------------------------------ No results for input(s): VITAMINB12, FOLATE, FERRITIN, TIBC, IRON, RETICCTPCT in the last 72 hours.  Coagulation profile  Recent Labs Lab 06/12/14 2143  INR 1.15    No results for input(s): DDIMER in the last 72 hours.  Cardiac Enzymes No results for input(s): CKMB, TROPONINI, MYOGLOBIN in the last 168 hours.  Invalid input(s):  CK ------------------------------------------------------------------------------------------------------------------ Invalid input(s): POCBNP   Recent Labs  06/15/14 1648 06/15/14 2035 06/16/14 0017 06/16/14 0415 06/16/14 0743 06/16/14 1152  GLUCAP 120* 151* 117* 101*  102* 87     RAI,RIPUDEEP M.D. [facility] 06/16/2014, 12:24 PM  Pager: [phone]   Between 7am to 7pm - call Pager - [phone]  After 7pm go to [email or web address] - password [email or web address]  Call night coverage person covering after 7pm

## 2014-06-17 ENCOUNTER — Encounter (HOSPITAL_COMMUNITY): Payer: Self-pay | Admitting: Gastroenterology

## 2014-06-20 ENCOUNTER — Non-Acute Institutional Stay (SKILLED_NURSING_FACILITY): Payer: Medicare Other | Admitting: Adult Health

## 2014-06-20 DIAGNOSIS — E1129 Type 2 diabetes mellitus with other diabetic kidney complication: Secondary | ICD-10-CM

## 2014-06-20 DIAGNOSIS — Z89512 Acquired absence of left leg below knee: Secondary | ICD-10-CM | POA: Diagnosis not present

## 2014-06-20 DIAGNOSIS — I5022 Chronic systolic (congestive) heart failure: Secondary | ICD-10-CM

## 2014-06-20 DIAGNOSIS — N186 End stage renal disease: Secondary | ICD-10-CM

## 2014-06-20 DIAGNOSIS — I998 Other disorder of circulatory system: Secondary | ICD-10-CM | POA: Diagnosis not present

## 2014-06-20 DIAGNOSIS — N058 Unspecified nephritic syndrome with other morphologic changes: Secondary | ICD-10-CM

## 2014-06-20 DIAGNOSIS — I70229 Atherosclerosis of native arteries of extremities with rest pain, unspecified extremity: Secondary | ICD-10-CM

## 2014-06-23 ENCOUNTER — Encounter: Payer: Self-pay | Admitting: Endocrinology

## 2014-06-23 ENCOUNTER — Ambulatory Visit (INDEPENDENT_AMBULATORY_CARE_PROVIDER_SITE_OTHER): Payer: Medicare Other | Admitting: Endocrinology

## 2014-06-23 VITALS — BP 118/72 | HR 106 | Temp 98.1°F | Resp 14 | Ht 63.0 in

## 2014-06-23 DIAGNOSIS — IMO0002 Reserved for concepts with insufficient information to code with codable children: Secondary | ICD-10-CM

## 2014-06-23 DIAGNOSIS — I2510 Atherosclerotic heart disease of native coronary artery without angina pectoris: Secondary | ICD-10-CM

## 2014-06-23 DIAGNOSIS — E1165 Type 2 diabetes mellitus with hyperglycemia: Secondary | ICD-10-CM

## 2014-06-23 DIAGNOSIS — Z89512 Acquired absence of left leg below knee: Secondary | ICD-10-CM

## 2014-06-23 DIAGNOSIS — E119 Type 2 diabetes mellitus without complications: Secondary | ICD-10-CM | POA: Diagnosis not present

## 2014-06-23 LAB — POCT GLUCOSE (DEVICE FOR HOME USE): GLUCOSE FASTING, POC: 299 mg/dL — AB (ref 70–99)

## 2014-06-23 NOTE — Patient Instructions (Signed)
Please check blood sugars BEFORE EACH MEAL and some about 2 hours after any meal    Please bring blood sugar monitor to each visit. Recommended blood sugar levels about 2 hours after meal is 140-180 and on waking up 90-130  NOVOLOG: Take 2 units at breakfast and 2-3 at lunch and supper unless not eating a starch  CBG 151 - 200: 1 units, CBG 201 - 250: 2 units, CBG 251 - 300: 4 units, CBG 301 - 350: 5 units

## 2014-06-23 NOTE — Progress Notes (Signed)
Patient ID: Elizabeth [name], female   DOB: 1945-11-16, 69 y.o.   MRN: [id number]    Reason for Appointment: Follow-up for Diabetes  Referring physician: Lerry Liner  History of Present Illness:          Diagnosis: Type 2 diabetes mellitus, date of diagnosis: 1996        Past history:  She was initially treated with metformin and glyburide and her detailed history or level of control is not available She was switched to insulin probably in 2013 She had been taking a regimen of Lantus at night and NovoLog before meals prior to her initial consultation  Recent history:   INSULIN regimen is described as: Lantus 7 units at bedtime, NovoLog prn  She has not been seen in follow-up since 02/2014 because of recent hospitalization On her last visit she was told to start taking at least a minimum amount of NovoLog to cover her meals However since her discharge from the hospital she is taking NovoLog only on a sliding scale She has however continued taking Lantus consistently as directed Her A1c tends to be lower than expected possibly because of her anemia and end-stage renal disease  Current blood sugar patterns and problems with management:  She did not bring her monitor for download and not clear how when she is checking them.  She thinks she is checking sugars after eating, sometimes 15 minutes later and then she will take her insulin based on the blood sugar  She does not remember her blood sugar readings and not clear if she is actually checking her sugar with every meal  She believes her blood sugars are fairly good in the morning, relatively higher today at 164  She did not understand the need for covering her meals with NOVOLOG and has poor understanding on meal planning; she had a starchy breakfast today and did not take any NovoLog, blood sugar is nearly 300 in the office today  No hypoglycemia recently   Compliance with the medical regimen: Inadequate     Glucose monitoring:        Glucometer: ? Verio  Blood sugars by recall:  PRE-MEAL Breakfast Lunch Dinner Bedtime Overall  Glucose range: 120-150 ? ? 160s   Mean/median:        Glycemic control:    Lab Results  Component Value Date   HGBA1C 6.7* 06/12/2014   HGBA1C 6.5* 06/12/2014   HGBA1C 7.0* 05/09/2014   Lab Results  Component Value Date   LDLCALC 76 03/21/2014   CREATININE 2.99* 06/14/2014     Self-care: The diet that the patient has been following is: None       Meals: 3 meals per day. Breakfast is variable, today had grits, lunch is a sandwich and ginger ale. Mixed meal at dinner, snacks are sugar-free desserts, fruit or applesauce         Exercise: None           Dietician visit: Most recent: Several years ago.  consultation with CDE in 09/2013              Weight history:  Wt Readings from Last 3 Encounters:  06/16/14 82 lb 10.8 oz (37.5 kg)  06/03/14 87 lb (39.463 kg)  05/27/14 79 lb (35.834 kg)       Medication List       This list is accurate as of: 06/23/14  1:05 PM.  Always use your most recent med list.  carvedilol 6.25 MG tablet  Commonly known as:  COREG  take 1 tablet by mouth twice a day     cetirizine 10 MG tablet  Commonly known as:  ZYRTEC  Take 10 mg by mouth as needed for allergies.     Darbepoetin Alfa 200 MCG/0.4ML Sosy injection  Commonly known as:  ARANESP  Inject 0.4 mLs (200 mcg total) into the vein every Thursday with hemodialysis.     DECUBI-VITE Caps  Take 2 capsules by mouth daily.     docusate sodium 100 MG capsule  Commonly known as:  COLACE  Take 1 capsule (100 mg total) by mouth daily.     doxercalciferol 4 MCG/2ML injection  Commonly known as:  HECTOROL  Inject 0.5 mLs (1 mcg total) into the vein Every Tuesday,Thursday,and Saturday with dialysis.     feeding supplement (NEPRO CARB STEADY) Liqd  Take 237 mLs by mouth daily at 2 PM daily at 2 PM.     feeding supplement (PRO-STAT SUGAR FREE 64)  Liqd  Take 30 mLs by mouth 3 (three) times daily with meals.     glucose blood test strip  Commonly known as:  ONETOUCH VERIO  Use as instructed to check blood sugar 4 times per day dx code E13.10     HYDROGEL Gel  Apply 1 application topically as needed (for sacrum).     insulin aspart 100 UNIT/ML injection  Commonly known as:  novoLOG  Inject 0-9 Units into the skin 3 (three) times daily with meals. Sliding scale CBG 70 - 120: 0 units CBG 121 - 150: 1 unit,  CBG 151 - 200: 2 units,  CBG 201 - 250: 3 units,  CBG 251 - 300: 5 units,  CBG 301 - 350: 7 units,  CBG 351 - 400: 9 units   CBG > 400: 9 units and notify your MD     insulin glargine 100 UNIT/ML injection  Commonly known as:  LANTUS  Inject 0.07 mLs (7 Units total) into the skin daily at 10 pm.     lidocaine-prilocaine cream  Commonly known as:  EMLA  Apply topically once.     loratadine 10 MG tablet  Commonly known as:  CLARITIN  Take 10 mg by mouth daily as needed for allergies.     ONETOUCH DELICA LANCETS FINE Misc  Use to check blood sugar 4 times per day     oxyCODONE-acetaminophen 5-325 MG per tablet  Commonly known as:  PERCOCET/ROXICET  Take 1 tablet by mouth every 8 (eight) hours as needed for moderate pain or severe pain.     pantoprazole 40 MG tablet  Commonly known as:  PROTONIX  Take 1 tablet (40 mg total) by mouth daily.     pravastatin 20 MG tablet  Commonly known as:  PRAVACHOL  Take 20 mg by mouth daily.     saccharomyces boulardii 250 MG capsule  Commonly known as:  FLORASTOR  Take 1 capsule (250 mg total) by mouth 2 (two) times daily.     sevelamer carbonate 800 MG tablet  Commonly known as:  RENVELA  Take 1 tablet (800 mg total) by mouth 3 (three) times daily with meals.        Allergies: No Known Allergies  Past Medical History  Diagnosis Date  . Hyperlipidemia   . Urinary retention     DX NEUROGENIC BLADDER--HAS INDWELLING FOLEY CATHETER  . Anemia   . CAD (coronary artery  disease)   . Neuropathic pain   .  Foley catheter in place     for urinary retention  . Critical lower limb ischemia   . Hypertension   . Peripheral vascular disease   . Hypoglycemia 08/19/2013  . Hypokalemia 08/19/2013  . IDDM (insulin dependent diabetes mellitus)   . Hematemesis 06/12/2014 hospitalized  . Stroke     "minor one"  . Arthritis     "right knee" (06/12/2014)  . Gout     "was in my LLE"  . ESRD (end stage renal disease)     [address], [place], [place] (06/12/2014)    Past Surgical History  Procedure Laterality Date  . Insertion of suprapubic catheter N/A 05/16/2012    Procedure: INSERTION OF SUPRAPUBIC CATHETER;  Surgeon: Sebastian Ache, MD;  Location: [facility] ORS;  Service: Urology;  Laterality: N/A;  . Bascilic vein transposition Left 10/30/2012    Procedure: LEFT 1ST STAGE BASCILIC VEIN TRANSPOSITION;  Surgeon: Chuck Hint, MD;  Location: [facility] OR;  Service: Vascular;  Laterality: Left;  . Nm myocar perf wall motion  04/19/2012    low risk study-EF 44%,mild inferolateral hypkinesis  . Bascilic vein transposition Left 02/05/2013    Procedure: BASCILIC VEIN TRANSPOSITION 2ND STAGE;  Surgeon: Chuck Hint, MD;  Location: [facility] OR;  Service: Vascular;  Laterality: Left;  . Radiology with anesthesia N/A 10/25/2013    Procedure: RADIOLOGY WITH ANESTHESIA;  Surgeon: Medication Radiologist, MD;  Location: [facility] OR;  Service: Radiology;  Laterality: N/A;  . Fistulogram Left 04/29/2013    Procedure: FISTULOGRAM;  Surgeon: Chuck Hint, MD;  Location: [facility] CATH LAB;  Service: Cardiovascular;  Laterality: Left;  . Angioplasty Left 04/29/2013    Procedure: ANGIOPLASTY;  Surgeon: Chuck Hint, MD;  Location: [facility] CATH LAB;  Service: Cardiovascular;  Laterality: Left;  . Abdominal aortagram N/A 03/19/2014    Procedure: ABDOMINAL Ronny Flurry;  Surgeon: Sherren Kerns, MD;  Location: [facility] CATH LAB;  Service: Cardiovascular;  Laterality: N/A;  . Left and right heart catheterization  with coronary angiogram N/A 03/24/2014    Procedure: LEFT AND RIGHT HEART CATHETERIZATION WITH CORONARY ANGIOGRAM;  Surgeon: Marykay Lex, MD;  Location: [facility] CATH LAB;  Service: Cardiovascular;  Laterality: N/A;  . Endarterectomy popliteal Left 03/26/2014    Procedure: Left popliteal and peroneal artery endarterectomy with vein patch angioplasty;  Surgeon: Sherren Kerns, MD;  Location: [facility] OR;  Service: Vascular;  Laterality: Left;  . Amputation Left 03/26/2014    Procedure: AMPUTATION SECOND LEFT TOE;  Surgeon: Sherren Kerns, MD;  Location: [facility] OR;  Service: Vascular;  Laterality: Left;  . Av fistula placement Left 04/28/2014    Procedure: CREATION OF LEFT BRACHIOCEPHALIC  ARTERIOVENOUS (AV) FISTULA CREATION;  Surgeon: Fransisco Hertz, MD;  Location: [facility] OR;  Service: Vascular;  Laterality: Left;  . Amputation Left 05/10/2014    Procedure: AMPUTATION BELOW KNEE;  Surgeon: Larina Earthly, MD;  Location: [facility] OR;  Service: Vascular;  Laterality: Left;  . Esophagogastroduodenoscopy N/A 06/13/2014    Procedure: ESOPHAGOGASTRODUODENOSCOPY (EGD);  Surgeon: Jeani Hawking, MD;  Location: [facility] ENDOSCOPY;  Service: Endoscopy;  Laterality: N/A;    Family History  Problem Relation Age of Onset  . Hyperlipidemia Mother   . Hypertension Mother   . Hodgkin's lymphoma Father   . Heart disease Sister     Social History:  reports that she has never smoked. She has never used smokeless tobacco. She reports that she drinks alcohol. She reports that she does not use illicit drugs.    Review of  Systems    She recently had left below-knee amputation  Eyes: She has poor vision for unknown reasons       Lipids: . Taking pravastatin 20 mg       Lab Results  Component Value Date   CHOL 152 03/21/2014   HDL 54 03/21/2014   LDLCALC 76 03/21/2014   TRIG 109 03/21/2014   CHOLHDL 2.8 03/21/2014                  The blood pressure has been high previously for several years but currently only on low-dose carvedilol,  followed by nephrologist      She has had end-stage kidney disease on dialysis    Previous foot exam had shown pulses are absent in the feet. Has mild decrease in monofilament sensation in all toes.  Physical Examination:  BP 118/72 mmHg  Pulse 106  Temp(Src) 98.1 F (36.7 C)  Resp 14  Ht 5\' 3"  (1.6 m)  Wt   SpO2 98%     ASSESSMENT:  Diabetes type 2, insulin requiring, nonobese See history of present illness for current management, problems identified in blood sugar patterns Difficult to assess her control as she is probably not following her glucose monitoring and insulin instructions  She does not bring her monitor for download She does not always have balanced meals and discussed needing to add protein to each meal especially breakfast Currently not taking mealtime insulin as directed before eating and does not understand the need to cover all meals especially with carbohydrates with rapid acting insulin Does not understand the timing of insulin and glucose monitoring  Hyperlipidemia:  controlled with pravastatin, will repeat levels on the next visit   PLAN:   Discussed need to cover all meals with NovoLog before eating to cover her postprandial rise in blood sugar especially when eating carbohydrates.  She will try to take at least 2 units with every meal including breakfast  She will need to bring her blood glucose monitor for review on each visit  Discussed timing of glucose monitoring to be done before eating and also some readings about 2 hours after one of her meals especially supper  She will be given additional insulin 5 sugars and explained the need to take mealtime coverage along with correction doses together before eating  Follow-up with nurse educator in 2 weeks to reinforce today's instructions and review of her progress  She will take her Lantus dose of 7 units consistently regardless of dialysis days, may consider adjusting this if fasting readings are  consistently high  Patient Instructions  Please check blood sugars BEFORE EACH MEAL and some about 2 hours after any meal    Please bring blood sugar monitor to each visit. Recommended blood sugar levels about 2 hours after meal is 140-180 and on waking up 90-130  NOVOLOG: Take 2 units at breakfast and 2-3 at lunch and supper unless not eating a starch  CBG 151 - 200: 1 units, CBG 201 - 250: 2 units, CBG 251 - 300: 4 units, CBG 301 - 350: 5 units   Counseling time over 50% of today's 25 minute visit  Dulse Rutan 06/23/2014, 1:05 PM   Note: This office note was prepared with Insurance underwriter. Any transcriptional errors that result from this process are unintentional.

## 2014-06-24 ENCOUNTER — Telehealth: Payer: Self-pay | Admitting: Endocrinology

## 2014-06-24 NOTE — Telephone Encounter (Signed)
[facility] medical POA  for patient asked is patient able to sit through a graduation, please advise

## 2014-06-25 ENCOUNTER — Inpatient Hospital Stay (HOSPITAL_COMMUNITY)
Admission: EM | Admit: 2014-06-25 | Discharge: 2014-06-28 | DRG: 871 | Disposition: A | Discharge status: Skilled Nursing Facility | Payer: Medicare Other | Attending: Internal Medicine | Admitting: Internal Medicine

## 2014-06-25 ENCOUNTER — Emergency Department (HOSPITAL_COMMUNITY): Payer: Medicare Other

## 2014-06-25 ENCOUNTER — Encounter (HOSPITAL_COMMUNITY): Payer: Self-pay

## 2014-06-25 DIAGNOSIS — I739 Peripheral vascular disease, unspecified: Secondary | ICD-10-CM | POA: Diagnosis present

## 2014-06-25 DIAGNOSIS — N2581 Secondary hyperparathyroidism of renal origin: Secondary | ICD-10-CM | POA: Diagnosis present

## 2014-06-25 DIAGNOSIS — D696 Thrombocytopenia, unspecified: Secondary | ICD-10-CM | POA: Diagnosis present

## 2014-06-25 DIAGNOSIS — E872 Acidosis, unspecified: Secondary | ICD-10-CM | POA: Diagnosis present

## 2014-06-25 DIAGNOSIS — T68XXXD Hypothermia, subsequent encounter: Secondary | ICD-10-CM | POA: Diagnosis not present

## 2014-06-25 DIAGNOSIS — Z8673 Personal history of transient ischemic attack (TIA), and cerebral infarction without residual deficits: Secondary | ICD-10-CM | POA: Diagnosis not present

## 2014-06-25 DIAGNOSIS — Z89512 Acquired absence of left leg below knee: Secondary | ICD-10-CM

## 2014-06-25 DIAGNOSIS — Z992 Dependence on renal dialysis: Secondary | ICD-10-CM | POA: Diagnosis not present

## 2014-06-25 DIAGNOSIS — T68XXXA Hypothermia, initial encounter: Secondary | ICD-10-CM | POA: Diagnosis present

## 2014-06-25 DIAGNOSIS — A419 Sepsis, unspecified organism: Secondary | ICD-10-CM | POA: Diagnosis present

## 2014-06-25 DIAGNOSIS — I501 Left ventricular failure: Secondary | ICD-10-CM | POA: Diagnosis present

## 2014-06-25 DIAGNOSIS — E785 Hyperlipidemia, unspecified: Secondary | ICD-10-CM | POA: Diagnosis present

## 2014-06-25 DIAGNOSIS — R6510 Systemic inflammatory response syndrome (SIRS) of non-infectious origin without acute organ dysfunction: Secondary | ICD-10-CM | POA: Insufficient documentation

## 2014-06-25 DIAGNOSIS — I120 Hypertensive chronic kidney disease with stage 5 chronic kidney disease or end stage renal disease: Secondary | ICD-10-CM | POA: Diagnosis present

## 2014-06-25 DIAGNOSIS — Z794 Long term (current) use of insulin: Secondary | ICD-10-CM

## 2014-06-25 DIAGNOSIS — N186 End stage renal disease: Secondary | ICD-10-CM

## 2014-06-25 DIAGNOSIS — Z89519 Acquired absence of unspecified leg below knee: Secondary | ICD-10-CM

## 2014-06-25 DIAGNOSIS — E43 Unspecified severe protein-calorie malnutrition: Secondary | ICD-10-CM | POA: Diagnosis present

## 2014-06-25 DIAGNOSIS — E1122 Type 2 diabetes mellitus with diabetic chronic kidney disease: Secondary | ICD-10-CM | POA: Diagnosis not present

## 2014-06-25 DIAGNOSIS — I429 Cardiomyopathy, unspecified: Secondary | ICD-10-CM | POA: Diagnosis present

## 2014-06-25 DIAGNOSIS — M1990 Unspecified osteoarthritis, unspecified site: Secondary | ICD-10-CM | POA: Diagnosis present

## 2014-06-25 DIAGNOSIS — E119 Type 2 diabetes mellitus without complications: Secondary | ICD-10-CM

## 2014-06-25 DIAGNOSIS — D649 Anemia, unspecified: Secondary | ICD-10-CM | POA: Diagnosis present

## 2014-06-25 DIAGNOSIS — L89154 Pressure ulcer of sacral region, stage 4: Secondary | ICD-10-CM | POA: Diagnosis present

## 2014-06-25 DIAGNOSIS — I5189 Other ill-defined heart diseases: Secondary | ICD-10-CM | POA: Diagnosis present

## 2014-06-25 DIAGNOSIS — I2510 Atherosclerotic heart disease of native coronary artery without angina pectoris: Secondary | ICD-10-CM | POA: Diagnosis present

## 2014-06-25 DIAGNOSIS — K219 Gastro-esophageal reflux disease without esophagitis: Secondary | ICD-10-CM | POA: Diagnosis present

## 2014-06-25 DIAGNOSIS — E11649 Type 2 diabetes mellitus with hypoglycemia without coma: Secondary | ICD-10-CM | POA: Diagnosis present

## 2014-06-25 DIAGNOSIS — Z681 Body mass index (BMI) 19 or less, adult: Secondary | ICD-10-CM

## 2014-06-25 DIAGNOSIS — M109 Gout, unspecified: Secondary | ICD-10-CM | POA: Diagnosis present

## 2014-06-25 DIAGNOSIS — I519 Heart disease, unspecified: Secondary | ICD-10-CM | POA: Diagnosis present

## 2014-06-25 DIAGNOSIS — E162 Hypoglycemia, unspecified: Secondary | ICD-10-CM | POA: Diagnosis present

## 2014-06-25 DIAGNOSIS — I5022 Chronic systolic (congestive) heart failure: Secondary | ICD-10-CM | POA: Insufficient documentation

## 2014-06-25 LAB — URINALYSIS, ROUTINE W REFLEX MICROSCOPIC
BILIRUBIN URINE: NEGATIVE
Glucose, UA: 250 mg/dL — AB
KETONES UR: NEGATIVE mg/dL
NITRITE: NEGATIVE
Protein, ur: >300 mg/dL — AB
SPECIFIC GRAVITY, URINE: 1.017 (ref 1.005–1.030)
UROBILINOGEN UA: 0.2 mg/dL (ref 0.0–1.0)
pH: 6 (ref 5.0–8.0)

## 2014-06-25 LAB — APTT: aPTT: 27 seconds (ref 24–37)

## 2014-06-25 LAB — COMPREHENSIVE METABOLIC PANEL
ALT: 36 U/L (ref 14–54)
AST: 51 U/L — ABNORMAL HIGH (ref 15–41)
Albumin: 3.4 g/dL — ABNORMAL LOW (ref 3.5–5.0)
Alkaline Phosphatase: 141 U/L — ABNORMAL HIGH (ref 38–126)
Anion gap: 17 — ABNORMAL HIGH (ref 5–15)
BUN: 40 mg/dL — ABNORMAL HIGH (ref 6–20)
CALCIUM: 9.3 mg/dL (ref 8.9–10.3)
CHLORIDE: 95 mmol/L — AB (ref 101–111)
CO2: 23 mmol/L (ref 22–32)
Creatinine, Ser: 3.64 mg/dL — ABNORMAL HIGH (ref 0.44–1.00)
GFR calc Af Amer: 14 mL/min — ABNORMAL LOW (ref >60)
GFR, EST NON AFRICAN AMERICAN: 12 mL/min — AB (ref >60)
GLUCOSE: 170 mg/dL — AB (ref 70–99)
Potassium: 5.1 mmol/L (ref 3.5–5.1)
SODIUM: 135 mmol/L (ref 135–145)
Total Bilirubin: 1 mg/dL (ref 0.3–1.2)
Total Protein: 8.7 g/dL — ABNORMAL HIGH (ref 6.5–8.1)

## 2014-06-25 LAB — CBC WITH DIFFERENTIAL/PLATELET
Basophils Absolute: 0 10*3/uL (ref 0.0–0.1)
Basophils Relative: 0 % (ref 0–1)
EOS ABS: 0 10*3/uL (ref 0.0–0.7)
Eosinophils Relative: 0 % (ref 0–5)
HCT: 49.9 % — ABNORMAL HIGH (ref 36.0–46.0)
Hemoglobin: 16.1 g/dL — ABNORMAL HIGH (ref 12.0–15.0)
Lymphocytes Relative: 8 % — ABNORMAL LOW (ref 12–46)
Lymphs Abs: 0.6 10*3/uL — ABNORMAL LOW (ref 0.7–4.0)
MCH: 26.9 pg (ref 26.0–34.0)
MCHC: 32.3 g/dL (ref 30.0–36.0)
MCV: 83.4 fL (ref 78.0–100.0)
MONOS PCT: 3 % (ref 3–12)
Monocytes Absolute: 0.2 10*3/uL (ref 0.1–1.0)
NEUTROS ABS: 6.4 10*3/uL (ref 1.7–7.7)
NEUTROS PCT: 89 % — AB (ref 43–77)
PLATELETS: 86 10*3/uL — AB (ref 150–400)
RBC: 5.98 MIL/uL — ABNORMAL HIGH (ref 3.87–5.11)
RDW: 20.5 % — ABNORMAL HIGH (ref 11.5–15.5)
WBC: 7.2 10*3/uL (ref 4.0–10.5)

## 2014-06-25 LAB — I-STAT TROPONIN, ED: Troponin i, poc: 0.02 ng/mL (ref 0.00–0.08)

## 2014-06-25 LAB — LACTIC ACID, PLASMA
Lactic Acid, Venous: 2.1 mmol/L (ref 0.5–2.0)
Lactic Acid, Venous: 2.3 mmol/L (ref 0.5–2.0)

## 2014-06-25 LAB — URINE MICROSCOPIC-ADD ON

## 2014-06-25 LAB — PROTIME-INR
INR: 1.13 (ref 0.00–1.49)
Prothrombin Time: 14.7 seconds (ref 11.6–15.2)

## 2014-06-25 LAB — I-STAT CG4 LACTIC ACID, ED
Lactic Acid, Venous: 3.64 mmol/L (ref 0.5–2.0)
Lactic Acid, Venous: 2.17 mmol/L (ref 0.5–2.0)

## 2014-06-25 LAB — GLUCOSE, CAPILLARY
Glucose-Capillary: 177 mg/dL — ABNORMAL HIGH (ref 70–99)
Glucose-Capillary: 184 mg/dL — ABNORMAL HIGH (ref 70–99)
Glucose-Capillary: 219 mg/dL — ABNORMAL HIGH (ref 70–99)

## 2014-06-25 LAB — CBG MONITORING, ED
GLUCOSE-CAPILLARY: 214 mg/dL — AB (ref 70–99)
Glucose-Capillary: 200 mg/dL — ABNORMAL HIGH (ref 70–99)

## 2014-06-25 LAB — PROCALCITONIN: Procalcitonin: 0.51 ng/mL

## 2014-06-25 MED ORDER — ACETAMINOPHEN 650 MG RE SUPP: 650.0000 mg | Freq: Four times a day (QID) | RECTAL | Status: DC | PRN

## 2014-06-25 MED ORDER — ASPIRIN 81 MG PO CHEW
81.0000 mg | CHEWABLE_TABLET | Freq: Every day | ORAL | Status: DC
  Administered 2014-06-25 – 2014-06-28 (×4): 81 mg via ORAL
  Filled 2014-06-25 (×4): qty 1

## 2014-06-25 MED ORDER — ALBUTEROL SULFATE (2.5 MG/3ML) 0.083% IN NEBU: 2.5000 mg | INHALATION_SOLUTION | RESPIRATORY_TRACT | Status: DC | PRN

## 2014-06-25 MED ORDER — PIPERACILLIN-TAZOBACTAM IN DEX 2-0.25 GM/50ML IV SOLN
2.2500 g | Freq: Four times a day (QID) | INTRAVENOUS | Status: DC
  Filled 2014-06-25 (×3): qty 50

## 2014-06-25 MED ORDER — SODIUM CHLORIDE 0.9 % IV SOLN
INTRAVENOUS | Status: AC
  Administered 2014-06-25: 20:00:00 via INTRAVENOUS

## 2014-06-25 MED ORDER — ONDANSETRON HCL 4 MG/2ML IJ SOLN
4.0000 mg | Freq: Four times a day (QID) | INTRAMUSCULAR | Status: DC | PRN
  Administered 2014-06-27 – 2014-06-28 (×3): 4 mg via INTRAVENOUS
  Filled 2014-06-25 (×2): qty 2

## 2014-06-25 MED ORDER — SODIUM CHLORIDE 0.9 % IV BOLUS (SEPSIS)
1200.0000 mL | Freq: Once | INTRAVENOUS | Status: AC
Start: 2014-06-25 — End: 2014-06-25
  Administered 2014-06-25: 1200 mL via INTRAVENOUS

## 2014-06-25 MED ORDER — VANCOMYCIN HCL IN DEXTROSE 1-5 GM/200ML-% IV SOLN
1000.0000 mg | Freq: Once | INTRAVENOUS | Status: AC
  Administered 2014-06-25: 1000 mg via INTRAVENOUS
  Filled 2014-06-25 (×2): qty 200

## 2014-06-25 MED ORDER — VANCOMYCIN HCL IN DEXTROSE 1-5 GM/200ML-% IV SOLN: 1000.0000 mg | Freq: Once | INTRAVENOUS | Status: DC

## 2014-06-25 MED ORDER — PANTOPRAZOLE SODIUM 40 MG PO TBEC
40.0000 mg | DELAYED_RELEASE_TABLET | Freq: Every day | ORAL | Status: DC
  Administered 2014-06-25 – 2014-06-28 (×4): 40 mg via ORAL
  Filled 2014-06-25 (×4): qty 1

## 2014-06-25 MED ORDER — ONDANSETRON HCL 4 MG PO TABS: 4.0000 mg | ORAL_TABLET | Freq: Four times a day (QID) | ORAL | Status: DC | PRN

## 2014-06-25 MED ORDER — SODIUM CHLORIDE 0.9 % IV BOLUS (SEPSIS)
500.0000 mL | Freq: Once | INTRAVENOUS | Status: AC
  Administered 2014-06-25: 500 mL via INTRAVENOUS

## 2014-06-25 MED ORDER — SODIUM CHLORIDE 0.9 % IJ SOLN
3.0000 mL | Freq: Two times a day (BID) | INTRAMUSCULAR | Status: DC
  Administered 2014-06-26 – 2014-06-27 (×4): 3 mL via INTRAVENOUS

## 2014-06-25 MED ORDER — VANCOMYCIN HCL 500 MG IV SOLR
500.0000 mg | INTRAVENOUS | Status: DC
  Filled 2014-06-25: qty 500

## 2014-06-25 MED ORDER — OXYCODONE-ACETAMINOPHEN 5-325 MG PO TABS: 1.0000 | ORAL_TABLET | Freq: Three times a day (TID) | ORAL | Status: DC | PRN

## 2014-06-25 MED ORDER — PIPERACILLIN-TAZOBACTAM IN DEX 2-0.25 GM/50ML IV SOLN
2.2500 g | Freq: Four times a day (QID) | INTRAVENOUS | Status: DC
  Administered 2014-06-26: 2.25 g via INTRAVENOUS
  Filled 2014-06-25 (×2): qty 50

## 2014-06-25 MED ORDER — SEVELAMER CARBONATE 800 MG PO TABS
800.0000 mg | ORAL_TABLET | Freq: Three times a day (TID) | ORAL | Status: DC
  Administered 2014-06-26 – 2014-06-28 (×6): 800 mg via ORAL
  Filled 2014-06-25 (×10): qty 1

## 2014-06-25 MED ORDER — PRAVASTATIN SODIUM 20 MG PO TABS
20.0000 mg | ORAL_TABLET | Freq: Every day | ORAL | Status: DC
  Administered 2014-06-25 – 2014-06-28 (×4): 20 mg via ORAL
  Filled 2014-06-25 (×4): qty 1

## 2014-06-25 MED ORDER — SACCHAROMYCES BOULARDII 250 MG PO CAPS
250.0000 mg | ORAL_CAPSULE | Freq: Two times a day (BID) | ORAL | Status: DC
  Administered 2014-06-25 – 2014-06-28 (×6): 250 mg via ORAL
  Filled 2014-06-25 (×8): qty 1

## 2014-06-25 MED ORDER — DEXTROSE 5 % IV SOLN
1.0000 g | Freq: Two times a day (BID) | INTRAVENOUS | Status: DC
  Administered 2014-06-25: 1 g via INTRAVENOUS
  Filled 2014-06-25: qty 1

## 2014-06-25 MED ORDER — ACETAMINOPHEN 325 MG PO TABS: 650.0000 mg | ORAL_TABLET | Freq: Four times a day (QID) | ORAL | Status: DC | PRN

## 2014-06-25 MED ORDER — ASPIRIN 81 MG PO TABS: 81.0000 mg | ORAL_TABLET | Freq: Every day | ORAL | Status: DC

## 2014-06-25 MED ORDER — NEPRO/CARBSTEADY PO LIQD
237.0000 mL | Freq: Every day | ORAL | Status: DC
  Administered 2014-06-25: 237 mL via ORAL

## 2014-06-25 NOTE — Progress Notes (Signed)
ANTIBIOTIC CONSULT NOTE - INITIAL  Pharmacy Consult for Vancomycin and Zosyn Indication: rule out sepsis  No Known Allergies  Patient Measurements:   Wt 37.5 kg  Vital Signs: Temp: 98.6 [name] (37 C) (05/04 1610) Temp Source: Rectal (05/04 1610) BP: 93/53 mmHg (05/04 1615) Pulse Rate: 107 (05/04 1615) Intake/Output from previous day:   Intake/Output from this shift: Total I/O In: -  Out: 175 [Urine:175]  Labs:  Recent Labs  06/25/14 1348  WBC 7.2  HGB 16.1*  PLT 86*  CREATININE 3.Elizabeth [name]*   Estimated Creatinine Clearance: 8.5 mL/min (by C-G formula based on Cr of 3.Elizabeth [name]). No results for input(s): VANCOTROUGH, VANCOPEAK, VANCORANDOM, GENTTROUGH, GENTPEAK, GENTRANDOM, TOBRATROUGH, TOBRAPEAK, TOBRARND, AMIKACINPEAK, AMIKACINTROU, AMIKACIN in the last 72 hours.   Microbiology: Recent Results (from the past 720 hour(s))  Culture, Urine     Status: None   Collection Time: 06/12/14  1:00 PM  Result Value Ref Range Status   Specimen Description URINE, RANDOM  Final   Special Requests NONE  Final   Colony Count   Final    >=100,000 COLONIES/ML Performed at [facility]    Culture   Final    Multiple bacterial morphotypes present, none predominant. Suggest appropriate recollection if clinically indicated. Performed at [facility]    Report Status 06/13/2014 FINAL  Final  MRSA PCR Screening     Status: None   Collection Time: 06/12/14  7:16 PM  Result Value Ref Range Status   MRSA by PCR NEGATIVE NEGATIVE Final    Comment:        The GeneXpert MRSA Assay (FDA approved for NASAL specimens only), is one component of a comprehensive MRSA colonization surveillance program. It is not intended to diagnose MRSA infection nor to guide or monitor treatment for MRSA infections.     Medical History: Past Medical History  Diagnosis Date  . Hyperlipidemia   . Urinary retention     DX NEUROGENIC BLADDER--HAS INDWELLING FOLEY CATHETER  . Anemia   . CAD  (coronary artery disease)   . Neuropathic pain   . Foley catheter in place     for urinary retention  . Critical lower limb ischemia   . Hypertension   . Peripheral vascular disease   . Hypoglycemia 08/19/2013  . Hypokalemia 08/19/2013  . IDDM (insulin dependent diabetes mellitus)   . Hematemesis 06/12/2014 hospitalized  . Stroke     "minor one"  . Arthritis     "right knee" (06/12/2014)  . Gout     "was in my LLE"  . ESRD (end stage renal disease)     [address], [place], S (06/12/2014)    Medications:  Scheduled:   Infusions:  . ceFEPime (MAXIPIME) IV Stopped (06/25/14 1525)  . vancomycin     Assessment: 69 yo [name] presenting at 06/25/2014 with recent left BKA to begin treatment with vancomycin and zosyn for suspected sepsis. Patient given 1 dose of cefepime 1 gm IV in ED. SCr elevated at 3.Elizabeth [name] (CrCl ~8 ml/min), tmax 98.6, WBC wnl. Blood and urine cultures sent.   Goal of Therapy:  Vancomycin trough level 15-20 mcg/ml  Plan:  - Vancomycin 1000 mg IV x 1, followed by 500 mg IV q48h - Zosyn 2.25 gm IV q6h  - Monitor renal function, temp, WBC, C&S, levels as indicated  Forbes Loll K. Bonnye Fava, PharmD, BCPS Clinical Pharmacist - Resident Pager: [phone] Pharmacy: [phone] 06/25/2014 5:11 PM

## 2014-06-25 NOTE — ED Notes (Signed)
Spoke to Dr. Rhunette Croft. Ok to feed pt.

## 2014-06-25 NOTE — H&P (Signed)
[facility] History and Physical  Elizabeth [name] MOQ:[id number] DOB: 09/20/1945 DOA: 06/25/2014   PCP: Elayne Snare, MD  Specialists: Followed by nephrology  Chief Complaint: Low blood sugar  HPI: Elizabeth [name] is a 69 y.o. female with a past medical history of diabetes, on insulin, end-stage renal disease on hemodialysis on Tuesday, Thursday, Saturday, peripheral vascular disease, recent left below-knee amputation in March who came home from a skilled nursing facility this past Saturday. She also had a recent hospitalization for issues related to dysphagia and was found to have esophageal achalasia. She is supposed to follow-up with gastroenterologist, Dr. Benson Norway. Apparently last night she slipped off of her bed onto the floor. She may have passed out, but she is not sure. She was found this morning by her home health nurse and was brought into the hospital for further evaluation. She was found to be hypoglycemic by the EMS with blood sugars in the 30s. She was given D50. She was brought into the hospital. She was found to have a temperature of 60F. Her mentation improved here. Blood sugar also improved. She denies any recent fever, chills. Denies any cough, nausea, vomiting, diarrhea, dysuria. Denies any joint pains. Her left stump is healing well. She tells me that while she was at the skilled nursing facility at on many occasion, she was found to have low blood sugar. Then she went to see her primary care physician who is also her endocrinologist. Her insulin regimen was changed. She thinks that she may have given herself too much insulin yesterday.  Home Medications: Prior to Admission medications   Medication Sig Start Date End Date Taking? Authorizing Provider  aspirin 81 MG tablet Take 81 mg by mouth daily.   Yes Historical Provider, MD  carvedilol (COREG) 6.25 MG tablet take 1 tablet by mouth twice a day 04/17/14  Yes Pixie Casino, MD  Darbepoetin Alfa (ARANESP) 200 MCG/0.4ML SOSY  injection Inject 0.4 mLs (200 mcg total) into the vein every Thursday with hemodialysis. 05/15/14  Yes Geradine Girt, DO  docusate sodium (COLACE) 100 MG capsule Take 1 capsule (100 mg total) by mouth daily. Patient taking differently: Take 100 mg by mouth daily as needed.  03/28/14  Yes Albertine Patricia, MD  doxercalciferol (HECTOROL) 4 MCG/2ML injection Inject 0.5 mLs (1 mcg total) into the vein Every Tuesday,Thursday,and Saturday with dialysis. 05/13/14  Yes Geradine Girt, DO  glucose blood (ONETOUCH VERIO) test strip Use as instructed to check blood sugar 4 times per day dx code E13.10 02/17/14  Yes Elayne Snare, MD  insulin aspart (NOVOLOG) 100 UNIT/ML injection Inject 0-9 Units into the skin 3 (three) times daily with meals. Sliding scale CBG 70 - 120: 0 units CBG 121 - 150: 1 unit,  CBG 151 - 200: 2 units,  CBG 201 - 250: 3 units,  CBG 251 - 300: 5 units,  CBG 301 - 350: 7 units,  CBG 351 - 400: 9 units   CBG > 400: 9 units and notify your MD 06/16/14  Yes Ripudeep Krystal Eaton, MD  insulin glargine (LANTUS) 100 UNIT/ML injection Inject 0.07 mLs (7 Units total) into the skin daily at 10 pm. Patient taking differently: Inject 10 Units into the skin daily at 10 pm.  05/13/14  Yes Geradine Girt, DO  Nutritional Supplements (FEEDING SUPPLEMENT, NEPRO CARB STEADY,) LIQD Take 237 mLs by mouth daily at 2 PM daily at 2 PM. 05/13/14  Yes Geradine Girt, DO  [place]  Use to check blood sugar 4 times per day 09/16/13  Yes Elayne Snare, MD  oxyCODONE-acetaminophen (PERCOCET/ROXICET) 5-325 MG per tablet Take 1 tablet by mouth every 8 (eight) hours as needed for moderate pain or severe pain. 06/16/14  Yes Ripudeep Krystal Eaton, MD  pantoprazole (PROTONIX) 40 MG tablet Take 1 tablet (40 mg total) by mouth daily. 05/13/14  Yes Geradine Girt, DO  saccharomyces boulardii (FLORASTOR) 250 MG capsule Take 1 capsule (250 mg total) by mouth 2 (two) times daily. 03/28/14  Yes Albertine Patricia, MD  sevelamer carbonate  (RENVELA) 800 MG tablet Take 1 tablet (800 mg total) by mouth 3 (three) times daily with meals. 12/28/13  Yes Ripudeep Krystal Eaton, MD  Amino Acids-Protein Hydrolys (FEEDING SUPPLEMENT, PRO-STAT SUGAR FREE 64,) LIQD Take 30 mLs by mouth 3 (three) times daily with meals.    Historical Provider, MD  cetirizine (ZYRTEC) 10 MG tablet Take 10 mg by mouth as needed for allergies.    Historical Provider, MD  lidocaine-prilocaine (EMLA) cream Apply topically once.    Historical Provider, MD  loratadine (CLARITIN) 10 MG tablet Take 10 mg by mouth daily as needed for allergies.    Historical Provider, MD  Multiple Vitamins-Minerals (DECUBI-VITE) CAPS Take 2 capsules by mouth daily.    Historical Provider, MD  pravastatin (PRAVACHOL) 20 MG tablet Take 20 mg by mouth daily.    Historical Provider, MD  Wound Dressings (HYDROGEL) GEL Apply 1 application topically as needed (for sacrum).    Historical Provider, MD    Allergies: No Known Allergies  Past Medical History: Past Medical History  Diagnosis Date  . Hyperlipidemia   . Urinary retention     DX NEUROGENIC BLADDER--HAS INDWELLING FOLEY CATHETER  . Anemia   . CAD (coronary artery disease)   . Neuropathic pain   . Foley catheter in place     for urinary retention  . Critical lower limb ischemia   . Hypertension   . Peripheral vascular disease   . Hypoglycemia 08/19/2013  . Hypokalemia 08/19/2013  . IDDM (insulin dependent diabetes mellitus)   . Hematemesis 06/12/2014 hospitalized  . Stroke     "minor one"  . Arthritis     "right knee" (06/12/2014)  . Gout     "was in my LLE"  . ESRD (end stage renal disease)     [address], [place], S (06/12/2014)    Past Surgical History  Procedure Laterality Date  . Insertion of suprapubic catheter N/A 05/16/2012    Procedure: INSERTION OF SUPRAPUBIC CATHETER;  Surgeon: Alexis Frock, MD;  Location: [facility] ORS;  Service: Urology;  Laterality: N/A;  . Bascilic vein transposition Left 10/30/2012    Procedure: LEFT  1ST STAGE BASCILIC VEIN TRANSPOSITION;  Surgeon: Angelia Mould, MD;  Location: [facility] OR;  Service: Vascular;  Laterality: Left;  . Nm myocar perf wall motion  04/19/2012    low risk study-EF 44%,mild inferolateral hypkinesis  . Bascilic vein transposition Left 02/05/2013    Procedure: BASCILIC VEIN TRANSPOSITION 2ND STAGE;  Surgeon: Angelia Mould, MD;  Location: [place];  Service: Vascular;  Laterality: Left;  . Radiology with anesthesia N/A 10/25/2013    Procedure: RADIOLOGY WITH ANESTHESIA;  Surgeon: Medication Radiologist, MD;  Location: [place];  Service: Radiology;  Laterality: N/A;  . Fistulogram Left 04/29/2013    Procedure: FISTULOGRAM;  Surgeon: Angelia Mould, MD;  Location: [facility] CATH LAB;  Service: Cardiovascular;  Laterality: Left;  . Angioplasty Left 04/29/2013    Procedure:  ANGIOPLASTY;  Surgeon: Angelia Mould, MD;  Location: [facility] CATH LAB;  Service: Cardiovascular;  Laterality: Left;  . Abdominal aortagram N/A 03/19/2014    Procedure: ABDOMINAL Maxcine Ham;  Surgeon: Elam Dutch, MD;  Location: [facility] CATH LAB;  Service: Cardiovascular;  Laterality: N/A;  . Left and right heart catheterization with coronary angiogram N/A 03/24/2014    Procedure: LEFT AND RIGHT HEART CATHETERIZATION WITH CORONARY ANGIOGRAM;  Surgeon: Leonie Man, MD;  Location: [facility] CATH LAB;  Service: Cardiovascular;  Laterality: N/A;  . Endarterectomy popliteal Left 03/26/2014    Procedure: Left popliteal and peroneal artery endarterectomy with vein patch angioplasty;  Surgeon: Elam Dutch, MD;  Location: [facility] OR;  Service: Vascular;  Laterality: Left;  . Amputation Left 03/26/2014    Procedure: AMPUTATION SECOND LEFT TOE;  Surgeon: Elam Dutch, MD;  Location: [place];  Service: Vascular;  Laterality: Left;  . Av fistula placement Left 04/28/2014    Procedure: CREATION OF LEFT BRACHIOCEPHALIC  ARTERIOVENOUS (AV) FISTULA CREATION;  Surgeon: Conrad McCall, MD;  Location: [place];  Service: Vascular;   Laterality: Left;  . Amputation Left 05/10/2014    Procedure: AMPUTATION BELOW KNEE;  Surgeon: Rosetta Posner, MD;  Location: [facility] OR;  Service: Vascular;  Laterality: Left;  . Esophagogastroduodenoscopy N/A 06/13/2014    Procedure: ESOPHAGOGASTRODUODENOSCOPY (EGD);  Surgeon: Carol Ada, MD;  Location: [facility] ENDOSCOPY;  Service: Endoscopy;  Laterality: N/A;    Social History: She is currently living in her house. No smoking, alcohol use or illicit drug use.  Family History:  Family History  Problem Relation Age of Onset  . Hyperlipidemia Mother   . Hypertension Mother   . Hodgkin's lymphoma Father   . Heart disease Sister      Review of Systems - History obtained from the patient General ROS: positive for  - fatigue Psychological ROS: negative Ophthalmic ROS: negative ENT ROS: negative Allergy and Immunology ROS: negative Hematological and Lymphatic ROS: negative Endocrine ROS: as in  hpi Respiratory ROS: no cough, shortness of breath, or wheezing Cardiovascular ROS: no chest pain or dyspnea on exertion Gastrointestinal ROS: no abdominal pain, change in bowel habits, or black or bloody stools Genito-Urinary ROS: no dysuria, trouble voiding, or hematuria Musculoskeletal ROS: negative Neurological ROS: no TIA or stroke symptoms Dermatological ROS: negative  Physical Examination  Filed Vitals:   06/25/14 1545 06/25/14 1600 06/25/14 1610 06/25/14 1615  BP: 84/72 95/55  93/53  Pulse: 109 112  107  Temp:   98.6 F (37 C)   TempSrc:   Rectal   Resp:    15  SpO2: 95% 97%  96%    BP 93/53 mmHg  Pulse 107  Temp(Src) 98.6 F (37 C) (Rectal)  Resp 15  SpO2 96%  General appearance: alert, cooperative, appears stated age and no distress Head: Normocephalic, without obvious abnormality, atraumatic Eyes: conjunctivae/corneas clear. PERRL, EOM's intact.  Throat: lips, mucosa, and tongue normal; teeth and gums normal Neck: no adenopathy, no carotid bruit, no JVD, supple, symmetrical,  trachea midline and thyroid not enlarged, symmetric, no tenderness/mass/nodules Resp: clear to auscultation bilaterally Cardio: regular rate and rhythm, S1, S2 normal, no murmur, click, rub or gallop.  Right-sided dialysis catheter site looks clean. GI: soft, non-tender; bowel sounds normal; no masses,  no organomegaly Extremities: Left lower extremity stump did not reveal any obvious infection. Incision site is healing well. Pulses: 2+ and symmetric Skin: Skin color, texture, turgor normal. No rashes or lesions Lymph nodes: Cervical, supraclavicular, and axillary  nodes normal. Neurologic: No focal deficits.  Laboratory Data: Results for orders placed or performed during the hospital encounter of 06/25/14 (from the past 48 hour(s))  CBG monitoring, ED     Status: Abnormal   Collection Time: 06/25/14 12:17 PM  Result Value Ref Range   Glucose-Capillary 214 (H) 70 - 99 mg/dL  CBC with Differential     Status: Abnormal   Collection Time: 06/25/14  1:48 PM  Result Value Ref Range   WBC 7.2 4.0 - 10.5 K/uL   RBC 5.98 (H) 3.87 - 5.11 MIL/uL   Hemoglobin 16.1 (H) 12.0 - 15.0 g/dL   HCT 49.9 (H) 36.0 - 46.0 %   MCV 83.4 78.0 - 100.0 fL   MCH 26.9 26.0 - 34.0 pg   MCHC 32.3 30.0 - 36.0 g/dL   RDW 20.5 (H) 11.5 - 15.5 %   Platelets 86 (L) 150 - 400 K/uL    Comment: PLATELET COUNT CONFIRMED BY SMEAR   Neutrophils Relative % 89 (H) 43 - 77 %   Neutro Abs 6.4 1.7 - 7.7 K/uL   Lymphocytes Relative 8 (L) 12 - 46 %   Lymphs Abs 0.6 (L) 0.7 - 4.0 K/uL   Monocytes Relative 3 3 - 12 %   Monocytes Absolute 0.2 0.1 - 1.0 K/uL   Eosinophils Relative 0 0 - 5 %   Eosinophils Absolute 0.0 0.0 - 0.7 K/uL   Basophils Relative 0 0 - 1 %   Basophils Absolute 0.0 0.0 - 0.1 K/uL   RBC Morphology POLYCHROMASIA PRESENT   Comprehensive metabolic panel     Status: Abnormal   Collection Time: 06/25/14  1:48 PM  Result Value Ref Range   Sodium 135 135 - 145 mmol/L   Potassium 5.1 3.5 - 5.1 mmol/L   Chloride  95 (L) 101 - 111 mmol/L   CO2 23 22 - 32 mmol/L   Glucose, Bld 170 (H) 70 - 99 mg/dL   BUN 40 (H) 6 - 20 mg/dL   Creatinine, Ser 3.64 (H) 0.44 - 1.00 mg/dL   Calcium 9.3 8.9 - 10.3 mg/dL   Total Protein 8.7 (H) 6.5 - 8.1 g/dL   Albumin 3.4 (L) 3.5 - 5.0 g/dL   AST 51 (H) 15 - 41 U/L   ALT 36 14 - 54 U/L   Alkaline Phosphatase 141 (H) 38 - 126 U/L   Total Bilirubin 1.0 0.3 - 1.2 mg/dL   GFR calc non Af Amer 12 (L) >60 mL/min   GFR calc Af Amer 14 (L) >60 mL/min    Comment: (NOTE) The eGFR has been calculated using the CKD EPI equation. This calculation has not been validated in all clinical situations. eGFR's persistently <90 mL/min signify possible Chronic Kidney Disease.    Anion gap 17 (H) 5 - 15  I-Stat Troponin, ED (not at [facility])     Status: None   Collection Time: 06/25/14  1:58 PM  Result Value Ref Range   Troponin i, poc 0.02 0.00 - 0.08 ng/mL   Comment 3            Comment: Due to the release kinetics of cTnI, a negative result within the first hours of the onset of symptoms does not rule out myocardial infarction with certainty. If myocardial infarction is still suspected, repeat the test at appropriate intervals.   I-Stat CG4 Lactic Acid, ED     Status: Abnormal   Collection Time: 06/25/14  1:59 PM  Result Value Ref Range  Lactic Acid, Venous 3.64 (HH) 0.5 - 2.0 mmol/L  Urinalysis, Routine w reflex microscopic     Status: Abnormal   Collection Time: 06/25/14  2:36 PM  Result Value Ref Range   Color, Urine YELLOW YELLOW   APPearance TURBID (A) CLEAR   Specific Gravity, Urine 1.017 1.005 - 1.030   pH 6.0 5.0 - 8.0   Glucose, UA 250 (A) NEGATIVE mg/dL   Hgb urine dipstick LARGE (A) NEGATIVE   Bilirubin Urine NEGATIVE NEGATIVE   Ketones, ur NEGATIVE NEGATIVE mg/dL   Protein, ur >300 (A) NEGATIVE mg/dL   Urobilinogen, UA 0.2 0.0 - 1.0 mg/dL   Nitrite NEGATIVE NEGATIVE   Leukocytes, UA LARGE (A) NEGATIVE  Urine microscopic-add on     Status: Abnormal    Collection Time: 06/25/14  2:36 PM  Result Value Ref Range   Squamous Epithelial / LPF FEW (A) RARE   WBC, UA TOO NUMEROUS TO COUNT <3 WBC/hpf   RBC / HPF 3-6 <3 RBC/hpf   Bacteria, UA MANY (A) RARE    Comment: MANY   Casts GRANULAR CAST (A) NEGATIVE   Urine-Other FEW YEAST   POC CBG, ED     Status: Abnormal   Collection Time: 06/25/14  4:08 PM  Result Value Ref Range   Glucose-Capillary 200 (H) 70 - 99 mg/dL  I-Stat CG4 Lactic Acid, ED     Status: Abnormal   Collection Time: 06/25/14  4:33 PM  Result Value Ref Range   Lactic Acid, Venous 2.17 (HH) 0.5 - 2.0 mmol/L   Comment NOTIFIED PHYSICIAN     Radiology Reports: Dg Chest Port 1 View  06/25/2014   CLINICAL DATA:  69 year old female with cough and hypoglycemia. Initial encounter.  EXAM: PORTABLE CHEST - 1 VIEW  COMPARISON:  Chest radiographs 06/12/2014 and earlier.  FINDINGS: Portable AP semi upright view at 1604 hours. Interval regressed bilateral pleural effusions and improved bibasilar ventilation. Stable cardiomegaly and mediastinal contours. Stable tunneled right chest dual lumen dialysis type catheter. No pneumothorax. No acute pulmonary edema. No consolidation.  IMPRESSION: Interval regressed pleural effusions and improved bibasilar ventilation. No new cardiopulmonary abnormality.   Electronically Signed   By: Genevie Ann M.D.   On: 06/25/2014 16:10    Electrocardiogram: Personally reviewed. Sinus rhythm in the 80s. Left axis deviation. Q waves in anterior leads. Similar to previous EKG.  Problem List  Principal Problem:   Hypothermia Active Problems:   Hypoglycemia   DM type 2 (diabetes mellitus, type 2)   LVF 30%   Moderate 3V CAD at cath 03/24/14   ESRD on dialysis   S/P BKA (below knee amputation) unilateral   Lactic acidosis   Assessment: This is a 69 year old African-American female who presents after a hypoglycemic episode at home. She is noted to be hypothermic as well. She has elevated lactic acid level. There is a  suspicion for sepsis, although most of this could be explained by hypoglycemia. UA is noted to be abnormal. She's also noted to be thrombocytopenic etiology of which is unclear.  Plan: #1 Hypothermia: Possible sepsis, though no clear source identified. She is noted to have abnormal UA but denies any symptoms. Follow urine culture. Follow-up blood cultures. Follow-up lactic acid levels, which is already improving. Temperature has improved with the warming blankets. Continue to monitor closely in step down unit.  #2 Possible sepsis with abnormal UA: See above. Chest x-ray actually showed improvement from previous films. Urine culture pending. Blood cultures pending. Monitor vital signs closely. Sepsis protocol  initiated. Continue broad-spectrum antibiotic coverage with vancomycin and Zosyn. De-escalate depending on test results and clinical progression.  #3 Thrombocytopenia: Etiology is unclear. No overt bleeding. Continue to monitor counts.  #4 hypoglycemia in the setting of known diabetes: It's quite possible that her insulin requirements have reduced significantly. Hypoglycemia could be due to increased insulin use. Her HbA1c was 6.7 back in April. We will repeat. We will check her CBGs closely for now. Hold all her insulin agents.  #5 history of nonischemic cardiomyopathy: EF is around 30%. She had a cardiac catheterization in February, which did not show any significant disease. Volume status being managed through dialysis.  #6 history of end-stage renal disease on hemodialysis: She gets dialyzed on Tuesday, Thursday, Saturday. Nephrology has been notified.  #7 Recent left below-knee amputation due to peripheral vascular disease: Stable. Left LE stump shows no evidence for infection. She supposed to have a prosthesis fitting in the near future. PT and OT to see once patient is more stable.   DVT Prophylaxis: SCDs Code Status: Full code Family Communication: Discussed with the patient    Disposition Plan: Admit to stepdown   Further management decisions will depend on results of further testing and patient's response to treatment.   [facility]  [facility] Pager [phone]  If 7PM-7AM, please contact night-coverage [email or web address] Password [email or web address]  06/25/2014, 5:22 PM

## 2014-06-25 NOTE — ED Notes (Signed)
Dr. Rhunette Croft made aware of pt's temp and placed on bear hugger. No further orders received at this time.

## 2014-06-25 NOTE — ED Notes (Signed)
Spoke with Elizabeth [name], Case manager concerning pt's condition and what she may be eligible for. She will call Elizabeth [name] and find out. Danielle, Trauma Charge made aware and will inform Selena Batten, primary RN

## 2014-06-25 NOTE — ED Notes (Signed)
Elizabeth [name] ,RN FOR PATIENTS LAB RESULTS OF CG4+LACTIC ACID , I-STAT TROPONIN @14 :10PM ,06/25/2014.

## 2014-06-25 NOTE — ED Notes (Signed)
Dr. nanavati at the bedside.  

## 2014-06-25 NOTE — ED Notes (Signed)
Per [facility], pt was released from [facility] on 4/30 and was supposed to have a home health aid come out and help do ADLs. A [facility] RN was there today for admission and found pt between couch and wheelchair and alert at 1030 and called 911. Noticed some blood to sacrum where she has a decubitus but denies any pain. Pt also recently had a Left BKA and was supposed to go today to [facility] and be fitted for a prosthetic. Initial CBG at home was 38, given juice and donut but wasn't chewing the donut and the juice was diet, rechecked CBG and it was 30. Started IV and gave D50. Came up to 256. Had her insulin changed yesterday.

## 2014-06-25 NOTE — Progress Notes (Signed)
Pt arrived from ED CHG bath done Monitor placed pt assessed. Has stage 3 decub on buttocks/sacral area. Cleansed and packed lightly with NS wet to dry 2x2 area measured 2cm x 1 cm 4 cm deep with tunneling noted Pt is noted to have very poor hygeine. When CHG bvathed cloths were black. Has 5 teeth on top of mouth - no dentures.

## 2014-06-25 NOTE — ED Notes (Signed)
Meal tray ordered for patient and given some crackers and peanut butter in the mean time.

## 2014-06-25 NOTE — Progress Notes (Addendum)
LCSW spoke with most recent SNF that patient was admitted to: [facility]. Tammy reports patient was discharged from in April 2016.  She was set up with Home Health: [facility] and they are currently active. Tammy reports patient was the one requesting to leave as she did not want or have funds to pay her patient monthly liability. Patient reported at time of DC with SNF that her granddaughter was home with her for assistance. Tammy reports patient has no SNF days left and her PML (patient monthly liability) is 1900. Reports patient made arrangements to hire PCS services in the home.    [facility] went out to home and found patient (currently this admission) patient was home alone with no family present. Thus patient does have home health, but appears family is not around 24/7.   Deretha Emory, MSW Clinical Social Work: Emergency Room [phone]

## 2014-06-26 ENCOUNTER — Encounter: Payer: Self-pay | Admitting: Vascular Surgery

## 2014-06-26 ENCOUNTER — Telehealth: Payer: Self-pay | Admitting: Endocrinology

## 2014-06-26 DIAGNOSIS — T68XXXD Hypothermia, subsequent encounter: Secondary | ICD-10-CM

## 2014-06-26 DIAGNOSIS — E872 Acidosis: Secondary | ICD-10-CM

## 2014-06-26 DIAGNOSIS — N189 Chronic kidney disease, unspecified: Secondary | ICD-10-CM

## 2014-06-26 DIAGNOSIS — Z89512 Acquired absence of left leg below knee: Secondary | ICD-10-CM

## 2014-06-26 DIAGNOSIS — N186 End stage renal disease: Secondary | ICD-10-CM

## 2014-06-26 DIAGNOSIS — N390 Urinary tract infection, site not specified: Secondary | ICD-10-CM

## 2014-06-26 DIAGNOSIS — Z992 Dependence on renal dialysis: Secondary | ICD-10-CM

## 2014-06-26 DIAGNOSIS — E1122 Type 2 diabetes mellitus with diabetic chronic kidney disease: Secondary | ICD-10-CM

## 2014-06-26 DIAGNOSIS — I519 Heart disease, unspecified: Secondary | ICD-10-CM

## 2014-06-26 DIAGNOSIS — A419 Sepsis, unspecified organism: Principal | ICD-10-CM

## 2014-06-26 DIAGNOSIS — E162 Hypoglycemia, unspecified: Secondary | ICD-10-CM

## 2014-06-26 LAB — LACTIC ACID, PLASMA: Lactic Acid, Venous: 1.6 mmol/L (ref 0.5–2.0)

## 2014-06-26 LAB — GLUCOSE, CAPILLARY
GLUCOSE-CAPILLARY: 153 mg/dL — AB (ref 70–99)
Glucose-Capillary: 174 mg/dL — ABNORMAL HIGH (ref 70–99)
Glucose-Capillary: 200 mg/dL — ABNORMAL HIGH (ref 70–99)
Glucose-Capillary: 238 mg/dL — ABNORMAL HIGH (ref 70–99)
Glucose-Capillary: 240 mg/dL — ABNORMAL HIGH (ref 70–99)
Glucose-Capillary: 90 mg/dL (ref 70–99)

## 2014-06-26 LAB — COMPREHENSIVE METABOLIC PANEL WITH GFR
ALT: 25 U/L (ref 14–54)
AST: 23 U/L (ref 15–41)
Albumin: 2.2 g/dL — ABNORMAL LOW (ref 3.5–5.0)
Alkaline Phosphatase: 105 U/L (ref 38–126)
Anion gap: 10 (ref 5–15)
BUN: 47 mg/dL — ABNORMAL HIGH (ref 6–20)
CO2: 26 mmol/L (ref 22–32)
Calcium: 8.1 mg/dL — ABNORMAL LOW (ref 8.9–10.3)
Chloride: 98 mmol/L — ABNORMAL LOW (ref 101–111)
Creatinine, Ser: 3.84 mg/dL — ABNORMAL HIGH (ref 0.44–1.00)
GFR calc Af Amer: 13 mL/min — ABNORMAL LOW
GFR calc non Af Amer: 11 mL/min — ABNORMAL LOW
Glucose, Bld: 180 mg/dL — ABNORMAL HIGH (ref 70–99)
Potassium: 5.1 mmol/L (ref 3.5–5.1)
Sodium: 134 mmol/L — ABNORMAL LOW (ref 135–145)
Total Bilirubin: 0.3 mg/dL (ref 0.3–1.2)
Total Protein: 5.9 g/dL — ABNORMAL LOW (ref 6.5–8.1)

## 2014-06-26 LAB — CBC
HCT: 37.6 % (ref 36.0–46.0)
Hemoglobin: 12.1 g/dL (ref 12.0–15.0)
MCH: 26.5 pg (ref 26.0–34.0)
MCHC: 32.2 g/dL (ref 30.0–36.0)
MCV: 82.3 fL (ref 78.0–100.0)
Platelets: 83 K/uL — ABNORMAL LOW (ref 150–400)
RBC: 4.57 MIL/uL (ref 3.87–5.11)
RDW: 20 % — ABNORMAL HIGH (ref 11.5–15.5)
WBC: 9.8 K/uL (ref 4.0–10.5)

## 2014-06-26 MED ORDER — HEPARIN SODIUM (PORCINE) 1000 UNIT/ML DIALYSIS: 1000.0000 [IU] | INTRAMUSCULAR | Status: DC | PRN

## 2014-06-26 MED ORDER — SODIUM CHLORIDE 0.9 % IV SOLN: 100.0000 mL | INTRAVENOUS | Status: DC | PRN

## 2014-06-26 MED ORDER — INSULIN ASPART 100 UNIT/ML ~~LOC~~ SOLN
0.0000 [IU] | Freq: Three times a day (TID) | SUBCUTANEOUS | Status: DC
  Administered 2014-06-26: 3 [IU] via SUBCUTANEOUS
  Administered 2014-06-27: 1 [IU] via SUBCUTANEOUS
  Administered 2014-06-27: 3 [IU] via SUBCUTANEOUS
  Administered 2014-06-27: 5 [IU] via SUBCUTANEOUS

## 2014-06-26 MED ORDER — PRO-STAT SUGAR FREE PO LIQD
30.0000 mL | Freq: Three times a day (TID) | ORAL | Status: DC
  Administered 2014-06-27 – 2014-06-28 (×4): 30 mL via ORAL
  Filled 2014-06-26 (×9): qty 30

## 2014-06-26 MED ORDER — NEPRO/CARBSTEADY PO LIQD: 237.0000 mL | Freq: Three times a day (TID) | ORAL | Status: DC

## 2014-06-26 MED ORDER — NEPRO/CARBSTEADY PO LIQD: 237.0000 mL | ORAL | Status: DC | PRN

## 2014-06-26 MED ORDER — LIDOCAINE-PRILOCAINE 2.5-2.5 % EX CREA: 1.0000 "application " | TOPICAL_CREAM | CUTANEOUS | Status: DC | PRN

## 2014-06-26 MED ORDER — NEPRO/CARBSTEADY PO LIQD
237.0000 mL | Freq: Every day | ORAL | Status: DC
  Administered 2014-06-27 – 2014-06-28 (×2): 237 mL via ORAL

## 2014-06-26 MED ORDER — HEPARIN SODIUM (PORCINE) 1000 UNIT/ML DIALYSIS: 2000.0000 [IU] | Freq: Once | INTRAMUSCULAR | Status: DC

## 2014-06-26 MED ORDER — LIDOCAINE HCL (PF) 1 % IJ SOLN: 5.0000 mL | INTRAMUSCULAR | Status: DC | PRN

## 2014-06-26 MED ORDER — PIPERACILLIN-TAZOBACTAM IN DEX 2-0.25 GM/50ML IV SOLN
2.2500 g | Freq: Three times a day (TID) | INTRAVENOUS | Status: DC
  Administered 2014-06-26 – 2014-06-28 (×6): 2.25 g via INTRAVENOUS
  Filled 2014-06-26 (×9): qty 50

## 2014-06-26 MED ORDER — ALTEPLASE 2 MG IJ SOLR: 2.0000 mg | Freq: Once | INTRAMUSCULAR | Status: DC | PRN

## 2014-06-26 MED ORDER — PENTAFLUOROPROP-TETRAFLUOROETH EX AERO: 1.0000 "application " | INHALATION_SPRAY | CUTANEOUS | Status: DC | PRN

## 2014-06-26 NOTE — Consult Note (Signed)
Renal Service Consult Note [facility]  Elizabeth [name] 06/26/2014 Elizabeth [name] D Requesting Physician:  Dr Gwenlyn Perking  Reason for Consult:  ESRD pt with hypoglycemic episode HPI: The patient is a 69 y.o. year-old with hx of DM, ESRD, NGB w indwelling foley sent from SNF for AMS and hypoglycemia. Hypothermic in ED. BS was 38.  Admitted. UA abnormal, on emp abx , awaiting cx's. Today no complaints and BS and temp are up.    Has NGB and changes her foley out every day, currently the SNF is taking care of this.  No chills, fevers. HD fistula left arm was stuck w/o good results so they are now using one needle in the fistula and using the cath for the other limb.    ROS  no cp, sob, no nv/d  no jt pain  no rash   no confusion  Past Medical History  Past Medical History  Diagnosis Date  . Hyperlipidemia   . Urinary retention     DX NEUROGENIC BLADDER--HAS INDWELLING FOLEY CATHETER  . Anemia   . CAD (coronary artery disease)   . Neuropathic pain   . Foley catheter in place     for urinary retention  . Critical lower limb ischemia   . Hypertension   . Peripheral vascular disease   . Hypoglycemia 08/19/2013  . Hypokalemia 08/19/2013  . IDDM (insulin dependent diabetes mellitus)   . Hematemesis 06/12/2014 hospitalized  . Stroke     "minor one"  . Arthritis     "right knee" (06/12/2014)  . Gout     "was in my LLE"  . ESRD (end stage renal disease)     [address], [place], [place] (06/12/2014)   Past Surgical History  Past Surgical History  Procedure Laterality Date  . Insertion of suprapubic catheter N/A 05/16/2012    Procedure: INSERTION OF SUPRAPUBIC CATHETER;  Surgeon: Sebastian Ache, MD;  Location: [facility] ORS;  Service: Urology;  Laterality: N/A;  . Bascilic vein transposition Left 10/30/2012    Procedure: LEFT 1ST STAGE BASCILIC VEIN TRANSPOSITION;  Surgeon: Chuck Hint, MD;  Location: [facility] OR;  Service: Vascular;  Laterality: Left;  . Nm myocar perf wall motion   04/19/2012    low risk study-EF 44%,mild inferolateral hypkinesis  . Bascilic vein transposition Left 02/05/2013    Procedure: BASCILIC VEIN TRANSPOSITION 2ND STAGE;  Surgeon: Chuck Hint, MD;  Location: [facility] OR;  Service: Vascular;  Laterality: Left;  . Radiology with anesthesia N/A 10/25/2013    Procedure: RADIOLOGY WITH ANESTHESIA;  Surgeon: Medication Radiologist, MD;  Location: [facility] OR;  Service: Radiology;  Laterality: N/A;  . Fistulogram Left 04/29/2013    Procedure: FISTULOGRAM;  Surgeon: Chuck Hint, MD;  Location: [facility] CATH LAB;  Service: Cardiovascular;  Laterality: Left;  . Angioplasty Left 04/29/2013    Procedure: ANGIOPLASTY;  Surgeon: Chuck Hint, MD;  Location: [facility] CATH LAB;  Service: Cardiovascular;  Laterality: Left;  . Abdominal aortagram N/A 03/19/2014    Procedure: ABDOMINAL Ronny Flurry;  Surgeon: Sherren Kerns, MD;  Location: [facility] CATH LAB;  Service: Cardiovascular;  Laterality: N/A;  . Left and right heart catheterization with coronary angiogram N/A 03/24/2014    Procedure: LEFT AND RIGHT HEART CATHETERIZATION WITH CORONARY ANGIOGRAM;  Surgeon: Marykay Lex, MD;  Location: [facility] CATH LAB;  Service: Cardiovascular;  Laterality: N/A;  . Endarterectomy popliteal Left 03/26/2014    Procedure: Left popliteal and peroneal artery endarterectomy with vein patch angioplasty;  Surgeon: Sherren Kerns, MD;  Location: [facility] OR;  Service: Vascular;  Laterality: Left;  . Amputation Left 03/26/2014    Procedure: AMPUTATION SECOND LEFT TOE;  Surgeon: Sherren Kerns, MD;  Location: [facility] OR;  Service: Vascular;  Laterality: Left;  . Av fistula placement Left 04/28/2014    Procedure: CREATION OF LEFT BRACHIOCEPHALIC  ARTERIOVENOUS (AV) FISTULA CREATION;  Surgeon: Fransisco Hertz, MD;  Location: [facility] OR;  Service: Vascular;  Laterality: Left;  . Amputation Left 05/10/2014    Procedure: AMPUTATION BELOW KNEE;  Surgeon: Larina Earthly, MD;  Location: [facility] OR;  Service: Vascular;  Laterality: Left;   . Esophagogastroduodenoscopy N/A 06/13/2014    Procedure: ESOPHAGOGASTRODUODENOSCOPY (EGD);  Surgeon: Jeani Hawking, MD;  Location: [facility] ENDOSCOPY;  Service: Endoscopy;  Laterality: N/A;   Family History  Family History  Problem Relation Age of Onset  . Hyperlipidemia Mother   . Hypertension Mother   . Hodgkin's lymphoma Father   . Heart disease Sister    Social History  reports that she has never smoked. She has never used smokeless tobacco. She reports that she drinks alcohol. She reports that she does not use illicit drugs. Allergies No Known Allergies Home medications Prior to Admission medications   Medication Sig Start Date End Date Taking? Authorizing Provider  aspirin 81 MG tablet Take 81 mg by mouth daily.   Yes Historical Provider, MD  carvedilol (COREG) 6.25 MG tablet take 1 tablet by mouth twice a day 04/17/14  Yes Chrystie Nose, MD  Darbepoetin Alfa (ARANESP) 200 MCG/0.4ML SOSY injection Inject 0.4 mLs (200 mcg total) into the vein every Thursday with hemodialysis. 05/15/14  Yes Joseph Art, DO  docusate sodium (COLACE) 100 MG capsule Take 1 capsule (100 mg total) by mouth daily. Patient taking differently: Take 100 mg by mouth daily as needed.  03/28/14  Yes Starleen Arms, MD  doxercalciferol (HECTOROL) 4 MCG/2ML injection Inject 0.5 mLs (1 mcg total) into the vein Every Tuesday,Thursday,and Saturday with dialysis. 05/13/14  Yes Joseph Art, DO  glucose blood (ONETOUCH VERIO) test strip Use as instructed to check blood sugar 4 times per day dx code E13.10 02/17/14  Yes [name], MD  insulin aspart (NOVOLOG) 100 UNIT/ML injection Inject 0-9 Units into the skin 3 (three) times daily with meals. Sliding scale CBG 70 - 120: 0 units CBG 121 - 150: 1 unit,  CBG 151 - 200: 2 units,  CBG 201 - 250: 3 units,  CBG 251 - 300: 5 units,  CBG 301 - 350: 7 units,  CBG 351 - 400: 9 units   CBG > 400: 9 units and notify your MD 06/16/14  Yes Ripudeep Jenna Luo, MD  insulin glargine (LANTUS)  100 UNIT/ML injection Inject 0.07 mLs (7 Units total) into the skin daily at 10 pm. Patient taking differently: Inject 10 Units into the skin daily at 10 pm.  05/13/14  Yes Joseph Art, DO  Nutritional Supplements (FEEDING SUPPLEMENT, NEPRO CARB STEADY,) LIQD Take 237 mLs by mouth daily at 2 PM daily at 2 PM. 05/13/14  Yes Joseph Art, DO  ONETOUCH DELICA LANCETS FINE MISC Use to check blood sugar 4 times per day 09/16/13  Yes [name], MD  oxyCODONE-acetaminophen (PERCOCET/ROXICET) 5-325 MG per tablet Take 1 tablet by mouth every 8 (eight) hours as needed for moderate pain or severe pain. 06/16/14  Yes Ripudeep Jenna Luo, MD  pantoprazole (PROTONIX) 40 MG tablet Take 1 tablet (40 mg total) by mouth daily. 05/13/14  Yes Shanda Bumps  U Vann, DO  saccharomyces boulardii (FLORASTOR) 250 MG capsule Take 1 capsule (250 mg total) by mouth 2 (two) times daily. 03/28/14  Yes Starleen Arms, MD  sevelamer carbonate (RENVELA) 800 MG tablet Take 1 tablet (800 mg total) by mouth 3 (three) times daily with meals. 12/28/13  Yes Ripudeep Jenna Luo, MD  Amino Acids-Protein Hydrolys (FEEDING SUPPLEMENT, PRO-STAT SUGAR FREE 64,) LIQD Take 30 mLs by mouth 3 (three) times daily with meals.    Historical Provider, MD  cetirizine (ZYRTEC) 10 MG tablet Take 10 mg by mouth as needed for allergies.    Historical Provider, MD  lidocaine-prilocaine (EMLA) cream Apply topically once.    Historical Provider, MD  loratadine (CLARITIN) 10 MG tablet Take 10 mg by mouth daily as needed for allergies.    Historical Provider, MD  Multiple Vitamins-Minerals (DECUBI-VITE) CAPS Take 2 capsules by mouth daily.    Historical Provider, MD  pravastatin (PRAVACHOL) 20 MG tablet Take 20 mg by mouth daily.    Historical Provider, MD  Wound Dressings (HYDROGEL) GEL Apply 1 application topically as needed (for sacrum).    Historical Provider, MD   Liver Function Tests  Recent Labs Lab 06/25/14 1348 06/26/14 0258  AST 51* 23  ALT 36 25  ALKPHOS  141* 105  BILITOT 1.0 0.3  PROT 8.7* 5.9*  ALBUMIN 3.4* 2.2*   No results for input(s): LIPASE, AMYLASE in the last 168 hours. CBC  Recent Labs Lab 06/25/14 1348 06/26/14 0258  WBC 7.2 9.8  NEUTROABS 6.4  --   HGB 16.1* 12.1  HCT 49.9* 37.6  MCV 83.4 82.3  PLT 86* 83*   Basic Metabolic Panel  Recent Labs Lab 06/25/14 1348 06/26/14 0258  NA 135 134*  K 5.1 5.1  CL 95* 98*  CO2 23 26  GLUCOSE 170* 180*  BUN 40* 47*  CREATININE 3.64* 3.84*  CALCIUM 9.3 8.1*    Filed Vitals:   06/26/14 0900 06/26/14 1000 06/26/14 1100 06/26/14 1135  BP: 107/68   97/55  Pulse: 117 112 116 115  Temp:    98 F (36.7 C)  TempSrc:    Oral  Resp: Height:      Weight:      SpO2: 98% 98% 99% 98%   Exam Alert, thin female in no distress No rash, cyanosis or gangrene Sclera anicteric, throat clear No jvd Chest clear bilat RRR no MRG ABd soft ntnd no ascites L bka, R leg trace edema LUA avf +bruit Neuro is alert and ox 3  HD: [facility] 3.5[facility]  F160   34kg  2/2.25 bath  P4  LUA AVF and R IJ cath (using 1 and 1) Heparin 2000 Hect 1 ug tiw   Assessment: 1. Hypoglycemia - improved 2. Hypothermia awaiting culture results 3. ESRD on HD TTS 4. Hypotension, chronic issue not on midodrine 5. Vol excess mild up 4kg 6. Thrombocytopenia 7. L BKA 8. DM on insulin 9. NG bladder 10. Sacral decub   Plan- HD today upstairs   Vinson Moselle MD (pgr) [phone]    ([phone] 06/26/2014, 12:54 PM

## 2014-06-26 NOTE — Telephone Encounter (Signed)
Elizabeth [name] from [facility] would like a letter from Dr. Lucianne Muss pertaining patients overall health appearance Patient was sent to hospital with wounds   Main Office: [phone] Fax: [phone]   Please advise

## 2014-06-26 NOTE — Telephone Encounter (Signed)
Please see below.

## 2014-06-26 NOTE — Progress Notes (Signed)
CRITICAL VALUE ALERT  Critical value received:  Positive blood cultures. 1 aerobic bottle with gram positive cocci in clusters.  Date of notification:  06/26/14  Time of notification:  1836  Critical value read back:Yes.    Nurse who received alert:  Hortense Ramal, RN  MD notified (1st page):  Dr. Gwenlyn Perking  Time of first page:  1840  MD notified (2nd page):  Time of second page:  Responding MD:  Dr. Gwenlyn Perking  Time MD responded:  [phone]

## 2014-06-26 NOTE — Progress Notes (Signed)
Admission note:   LATE ENTRY  Patient transferred to 6E29 from [facility] via wheelchair at 1430. Patient went immediately to dialysis. Unable to complete an assessment at this time.   Leanna Battles, RN.

## 2014-06-26 NOTE — Progress Notes (Signed)
Chaplain responded to spiritual care consult for pt requesting prayer. Pt requested prayer in decision making and concern for her granddaughter. Pt hopes to transition from rehab, to her apartment, and then to an assisted living facility. Chaplain offered prayer and emotional support. Pt also being spiritually supported by her home congregation. Page chaplain as needed.    06/26/14 1000  Clinical Encounter Type  Visited With Patient  Visit Type Initial;Spiritual support  Referral From Nurse  Consult/Referral To Chaplain  Spiritual Encounters  Spiritual Needs Emotional;Prayer  Stress Factors  Patient Stress Factors Major life changes  Ghazal Pevey, Mayer Masker, Chaplain 06/26/2014 10:06 AM

## 2014-06-26 NOTE — Consult Note (Addendum)
WOC wound consult note Reason for Consult: Consult requested for sacral wound.  Pt familiar to WOC from previous admission on 3/21.  Wound has decreased in size since that time. 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 Dressing procedure/placement/frequency: Alginate packing  to absorb drainage and promote healing. She states she is trying to eat more protein and has a foam cushion on her wheelchair to reduce pressure. Discussed plan of care with patient and she verbalizes understanding. Please re-consult if further assistance is needed.  Thank-you,  Cammie Mcgee MSN, RN, CWOCN, [place], CNS [phone]

## 2014-06-26 NOTE — Clinical Social Work Placement (Signed)
   CLINICAL SOCIAL WORK PLACEMENT  NOTE  Date:  06/26/2014  Patient Details  Name: Sheelah Partin MRN: [id number] Date of Birth: 1945/09/01  Clinical Social Work is seeking post-discharge placement for this patient at the Skilled  Nursing Facility level of care (*CSW will initial, date and re-position this form in  chart as items are completed):  Yes   Patient/family provided with [facility] Clinical Social Work Department's list of facilities offering this level of care within the geographic area requested by the patient (or if unable, by the patient's family).  Yes   Patient/family informed of their freedom to choose among providers that offer the needed level of care, that participate in Medicare, Medicaid or managed care program needed by the patient, have an available bed and are willing to accept the patient.  Yes   Patient/family informed of [facility]'s ownership interest in [facility] and [facility], as well as of the fact that they are under no obligation to receive care at these facilities.  PASRR submitted to EDS on       PASRR number received on       Existing PASRR number confirmed on 06/26/14     FL2 transmitted to all facilities in geographic area requested by pt/family on 06/26/14     FL2 transmitted to all facilities within larger geographic area on       Patient informed that his/her managed care company has contracts with or will negotiate with certain facilities, including the following:        Yes   Patient/family informed of bed offers received.  Patient chooses bed at [facility]     Physician recommends and patient chooses bed at [facility]    Patient to be transferred to   on  .  Patient to be transferred to facility by       Patient family notified on   of transfer.  Name of family member notified:        PHYSICIAN Please sign FL2     Additional Comment:     _______________________________________________ Raye Sorrow, LCSW 06/26/2014, 12:58 PM

## 2014-06-26 NOTE — Evaluation (Signed)
Physical Therapy Evaluation Patient Details Name: Elizabeth [name] MRN: [id number] DOB: 09-11-1945 Today's Date: 06/26/2014   History of Present Illness  Pt admitted from home after found down by her [facility] with blood sugar of 30 and hypothermic.  Pt with L BKA in April, h/o PVD and DM.  Clinical Impression  Patient presents with problems listed below.  Will benefit from acute PT to maximize independence prior to discharge.     Follow Up Recommendations SNF    Equipment Recommendations  None recommended by PT    Recommendations for Other Services       Precautions / Restrictions Precautions Precautions: Fall Restrictions Weight Bearing Restrictions: No      Mobility  Bed Mobility Overal bed mobility: Needs Assistance Bed Mobility: Supine to Sit     Supine to sit: Supervision;HOB elevated     General bed mobility comments: HOB up, use of rail  Transfers Overall transfer level: Needs assistance Equipment used: None Transfers: Squat Pivot Transfers     Squat pivot transfers: Min assist     General transfer comment: Patient able to reach across for opposite armrest with RUE and pivot to chair.  Assist for balance/safety  Ambulation/Gait                Stairs            Wheelchair Mobility    Modified Rankin (Stroke Patients Only)       Balance Overall balance assessment: Needs assistance   Sitting balance-Leahy Scale: Good                                       Pertinent Vitals/Pain Pain Assessment: No/denies pain    Home Living Family/patient expects to be discharged to:: Skilled nursing facility                 Additional Comments: pt is agreeable to return to SNF and progress to ALF    Prior Function Level of Independence: Needs assistance   Gait / Transfers Assistance Needed: pt was independent propelling her manual w/c and in pivot transfers to w/c  ADL's / Homemaking Assistance Needed: Pt reports she could  sponge bathe, dress and prepare a simple meal with the microwave prior to this admission.        Hand Dominance   Dominant Hand: Right    Extremity/Trunk Assessment   Upper Extremity Assessment: Defer to OT evaluation           Lower Extremity Assessment: Generalized weakness;LLE deficits/detail   LLE Deficits / Details: Lt BKA;  for prosthetic fitting soon  Cervical / Trunk Assessment: Normal  Communication   Communication: No difficulties  Cognition Arousal/Alertness: Awake/alert Behavior During Therapy: WFL for tasks assessed/performed Overall Cognitive Status: Within Functional Limits for tasks assessed                      General Comments      Exercises        Assessment/Plan    PT Assessment Patient needs continued PT services  PT Diagnosis Generalized weakness   PT Problem List Decreased strength;Decreased activity tolerance;Decreased balance;Decreased mobility  PT Treatment Interventions DME instruction;Functional mobility training;Therapeutic activities;Therapeutic exercise;Patient/family education   PT Goals (Current goals can be found in the Care Plan section) Acute Rehab PT Goals Patient Stated Goal: walk with a prosthesis, eventually return home PT Goal Formulation: With patient  Time For Goal Achievement: 07/10/14 Potential to Achieve Goals: Good    Frequency Min 2X/week   Barriers to discharge Decreased caregiver support Patient does not have 24 hour assist.    Co-evaluation PT/OT/SLP Co-Evaluation/Treatment: Yes Reason for Co-Treatment: For patient/therapist safety PT goals addressed during session: Mobility/safety with mobility OT goals addressed during session: ADL's and self-care       End of Session   Activity Tolerance: Patient tolerated treatment well Patient left: in chair;with call bell/phone within reach Nurse Communication: Mobility status         Time: 1020-1041 PT Time Calculation (min) (ACUTE ONLY): 21  min   Charges:   PT Evaluation $Initial PT Evaluation Tier I: 1 Procedure     PT G CodesVena Austria Jul 04, 2014, 12:44 PM Durenda Hurt. Renaldo Fiddler, [facility] Acute Rehab Services Pager [phone]

## 2014-06-26 NOTE — Clinical Social Work Note (Addendum)
Clinical Social Work Assessment  Patient Details  Name: Elizabeth [name] MRN: [id number] Date of Birth: 07-08-1945  Date of referral:  06/26/14               Reason for consult:  Facility Placement, Family Concerns                Permission sought to share information with:  Case Freight forwarder, Customer service manager, Family Supports Permission granted to share information::  Yes, Verbal Permission Granted  Name::     Granddaughter: Museum/gallery exhibitions officer  Agency::  [employer] Living  Relationship::  Tammy: facility rep, family, Engineer, mining Information:  see above  Housing/Transportation Living arrangements for the past 2 months:  [place], [place] of Information:  Patient, Medical Team, Facility, Other (Comment Required) (family: granddaughter) Patient Interpreter Needed:  None Criminal Activity/Legal Involvement Pertinent to Current Situation/Hospitalization:  No - Comment as needed Significant Relationships:  Other Family Members Lives with:  Facility Resident, Relatives Do you feel safe going back to the place where you live?    Need for family participation in patient care:  Yes (Comment) (patient can no longer live safely at home.  Granddaughter involved.)  Care giving concerns:  Facility and granddaughter called to discuss safety concerns of patient at home. Granddaughter has been allowing patient to live with her, however she is unable to provide 24/7 care.  Reports patient is need of supervision and long term care.  Patient was in a facility: [facility] per Tammy at facility, but she left because she did not want to give up her check (PML was 1900) thus she went home.  Granddaughter very involved in care, wants placement in SNF, working with patient to address needs as patient does not want to give up her check.   Social Worker assessment / plan:  LCSW consulted first in ED in which she met with patient and then on 2C as this writer is also covering the  unit. Patient from home living with granddaughter and has Home health coming.  Due to safety concerns and 24/7 supervision unable to be completed at this time, patient requires to return back to SNF.    Granddaughter went to [facility] and asked for patient to return as she cannot care for patient. Working with patient to understand needs and have patient placed long term. LCSW completed clinicals and faxed via CFP to [facility] for review. Family wants her to return there and pending assessment for readmission.  [facility] reports to send clinicals and room will be ready for her when she is ready to DC.   Employment status:  Retired Forensic scientist:  Information systems manager, [employer] PT Recommendations:  [place] / Referral to community resources:  [place]  Patient/Family's Response to care:  All agreeable to plan  Patient/Family's Understanding of and Emotional Response to Diagnosis, Current Treatment, and Prognosis:  Granddaughter aware of plan and understanding that patient needs more care than she is able to give. Patient at times agreeable, upset about her check and coming to understand her independence is limited due to physical abilities.  Patient disappointed, however was cooperative and agreeable at this time.    Emotional Assessment Appearance:  Appears stated age Attitude/Demeanor/Rapport:  Other (Approrpiate and cooperative) Affect (typically observed):  Accepting, Pleasant Orientation:  Oriented to Self, Oriented to Place, Oriented to Situation Alcohol / Substance use:  Not Applicable Psych involvement (Current and /or in the community):  No (Comment)  Discharge Needs  Concerns to be addressed:  Adjustment to Illness, Basic Needs, Care Coordination, Discharge Planning Concerns Readmission within the last 30 days:  Yes Current discharge risk:  Lack of support system Barriers to Discharge:  Continued Medical Work up   Marshell Garfinkel 06/26/2014, 12:44  PM

## 2014-06-26 NOTE — Progress Notes (Signed)
Dx hypothermia/hypoglycemia; lives alone, active with [facility] for nursing.  Pt now agrees to ST-SNF for rehab and is interested in ALF after d/c from SNF

## 2014-06-26 NOTE — Progress Notes (Addendum)
Initial Nutrition Assessment  DOCUMENTATION CODES:  Severe malnutrition in context of chronic illness, Underweight  INTERVENTION: Nepro Shake po daily, each supplement provides 425 kcal and 19 grams protein Prostat liquid protein po 30 ml TID with meals, each supplement provides 100 kcal, 15 grams protein  NUTRITION DIAGNOSIS:  Increased nutrient needs related to wound healing, other (see comment) (ESRD on HD) as evidenced by estimated needs.  GOAL:  Patient will meet greater than or equal to 90% of their needs  MONITOR:  PO intake, Supplement acceptance, Labs, Weight trends, I & O's  REASON FOR ASSESSMENT:  Consult Assessment of nutrition requirement/status  ASSESSMENT: 69 y.o. 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   Patient currently in HEMODIALYSIS.  Pt seen per Clinical Nutrition during previous hospitalizations.  Identified with severe malnutrition which is ongoing.  Stage IV sacral ulcer present.  CWOCN note 5/5 reviewed.  Weight has been stable.  Pt takes Nepro Shake daily and Prostat liquid protein TID with meals at SNF.  RD to order during hospitalization.  Height:  Ht Readings from Last 1 Encounters:  06/25/14 5\' 3"  (1.6 m)    Weight:  Wt Readings from Last 1 Encounters:  06/26/14 83 lb 1.8 oz (37.7 kg)    Ideal Body Weight:  49 kg -- adjusted for BKA  Wt Readings from Last 15 Encounters:  06/26/14 83 lb 1.8 oz (37.7 kg)  06/16/14 82 lb 10.8 oz (37.5 kg)  06/03/14 87 lb (39.463 kg)  05/27/14 79 lb (35.834 kg)  05/13/14 74 lb 1.2 oz (33.6 kg)  05/08/14 81 lb (36.741 kg)  04/28/14 84 lb (38.102 kg)  04/18/14 84 lb (38.102 kg)  04/09/14 84 lb (38.102 kg)  04/02/14 84 lb (38.102 kg)  03/28/14 85 lb 8.6 oz (38.8 kg)  02/18/14 86 lb (39.009 kg)  12/28/13 82 lb 14.3 oz (37.6 kg)   12/27/13 84 lb (38.102 kg)  10/24/13 90 lb 6.2 oz (41 kg)    BMI:  15.8 kg/m2 -- adjusted for BKA  Estimated Nutritional Needs:  Kcal:  1300-1500  Protein:  65-75 gm  Fluid:  1200 ml  Skin:  Wound (see comment) (Stage IV sacral wound)  Diet Order:  Diet Carb Modified Fluid consistency:: Thin; Room service appropriate?: Yes  EDUCATION NEEDS:  No education needs identified at this time   Intake/Output Summary (Last 24 hours) at 06/26/14 1525 Last data filed at 06/26/14 0600  Gross per 24 hour  Intake 1641.17 ml  Output     75 ml  Net 1566.17 ml    Last BM:  unknown  Maureen Chatters, RD, LDN Pager #: [phone] After-Hours Pager #: [phone]

## 2014-06-26 NOTE — Evaluation (Signed)
Occupational Therapy Evaluation Patient Details Name: Elizabeth [name] MRN: [id number] DOB: 01-15-1946 Today's Date: 06/26/2014    History of Present Illness Pt admitted from home after found down by her [facility] with blood sugar of 30 and hypothermic.  Pt with L BKA in April, h/o PVD and DM.   Clinical Impression    Pt reports being able to sponge bathe, dress, toilet and prepare a simple meal and propel her own w/c prior to admission.  She pulled herself to stand using cabinets/sinks and slept on her sofa.  Pt presents with impaired balance and generalized weakness.  She needs 24 hour supervision at discharge which her granddaughter cannot provide.  Pt is agreeable to return to SNF.  Pt is eager to have a prosthesis and learn to walk.  Will defer OT to SNF.         Follow Up Recommendations  SNF;Supervision/Assistance - 24 hour    Equipment Recommendations       Recommendations for Other Services       Precautions / Restrictions Precautions Precautions: Fall Restrictions Weight Bearing Restrictions: No      Mobility Bed Mobility Overal bed mobility: Needs Assistance Bed Mobility: Supine to Sit     Supine to sit: Supervision;HOB elevated     General bed mobility comments: HOB up, use of rail  Transfers Overall transfer level: Needs assistance   Transfers: Squat Pivot Transfers     Squat pivot transfers: Min assist     General transfer comment: did not stand upright    Balance Overall balance assessment: Needs assistance   Sitting balance-Leahy Scale: Good                                      ADL Overall ADL's : Needs assistance/impaired Eating/Feeding: Independent   Grooming: Set up;Sitting   Upper Body Bathing: Set up;Sitting   Lower Body Bathing: Minimal assistance;Sitting/lateral leans   Upper Body Dressing : Set up;Sitting   Lower Body Dressing: Minimal assistance;Sitting/lateral leans Lower Body Dressing Details (indicate cue  type and reason): able to donn and doff R sock in sitting Toilet Transfer: Minimal assistance;Squat-pivot Toilet Transfer Details (indicate cue type and reason): simulated to chair Toileting- Clothing Manipulation and Hygiene: Supervision/safety;Sitting/lateral lean               Vision     Perception     Praxis      Pertinent Vitals/Pain Pain Assessment: No/denies pain     Hand Dominance Right   Extremity/Trunk Assessment Upper Extremity Assessment Upper Extremity Assessment: Overall WFL for tasks assessed   Lower Extremity Assessment Lower Extremity Assessment: Defer to PT evaluation   Cervical / Trunk Assessment Cervical / Trunk Assessment: Normal   Communication Communication Communication: No difficulties   Cognition Arousal/Alertness: Awake/alert Behavior During Therapy: WFL for tasks assessed/performed Overall Cognitive Status: Within Functional Limits for tasks assessed                     General Comments       Exercises       Shoulder Instructions      Home Living Family/patient expects to be discharged to:: Skilled nursing facility                                 Additional Comments: pt is agreeable to return to  SNF and progress to ALF      Prior Functioning/Environment Level of Independence: Needs assistance  Gait / Transfers Assistance Needed: pt was independent propelling her manual w/c and in pivot transfers to w/c ADL's / Homemaking Assistance Needed: Pt reports she could sponge bathe, dress and prepare a simple meal with the microwave prior to this admission.        OT Diagnosis: Generalized weakness   OT Problem List:     OT Treatment/Interventions:      OT Goals(Current goals can be found in the care plan section) Acute Rehab OT Goals Patient Stated Goal: walk with a prosthesis, eventually return home  OT Frequency:     Barriers to D/C:            Co-evaluation PT/OT/SLP Co-Evaluation/Treatment:  Yes Reason for Co-Treatment: For patient/therapist safety   OT goals addressed during session: ADL's and self-care      End of Session    Activity Tolerance: Patient tolerated treatment well Patient left: in chair;with call bell/phone within reach   Time: 1020-1041 OT Time Calculation (min): 21 min Charges:  OT General Charges $OT Visit: 1 Procedure OT Evaluation $Initial OT Evaluation Tier I: 1 Procedure G-Codes:    Evern Bio 06/26/2014, 11:35 AM  [phone]

## 2014-06-26 NOTE — Procedures (Signed)
I was present at this dialysis session, have reviewed the session itself and made  appropriate changes  Vinson Moselle MD (pgr) [phone]    [phone] 06/26/2014, 6:16 PM

## 2014-06-26 NOTE — Telephone Encounter (Signed)
Noted, she is aware. 

## 2014-06-26 NOTE — Telephone Encounter (Signed)
This has to be done by her PCP.  I'm only taking care of her diabetes

## 2014-06-26 NOTE — Progress Notes (Signed)
Inpatient Diabetes Program Recommendations  AACE/ADA: New Consensus Statement on Inpatient Glycemic Control (2013)  Target Ranges:  Prepandial:   less than 140 mg/dL      Peak postprandial:   less than 180 mg/dL (1-2 hours)      Critically ill patients:  140 - 180 mg/dL   Results for SWAN, WERNE (MRN [id number]) as of 06/26/2014 11:43  Ref. Range 06/25/2014 12:17 06/25/2014 16:08 06/25/2014 18:49 06/25/2014 20:10 06/25/2014 21:58 06/26/2014 00:42 06/26/2014 04:15 06/26/2014 07:50  Glucose-Capillary Latest Ref Range: 70-99 mg/dL 150 (H) 569 (H) 794 (H) 184 (H) 219 (H) 200 (H) 153 (H) 238 (H)    Reason for Visit: Hypothermia/Hypoglycemia  Diabetes history: DM 2 (sees Dr. Lucianne Muss Endocrinology) Outpatient Diabetes medications: Lantus 10 units QHS, Novolog 0-9 units TID Current orders for Inpatient glycemic control: Novolog 0-9 units TID  Inpatient Diabetes Program Recommendations Insulin - Basal: Patient takes Lantus 10 units at home. Patient came in with hypoglycemia. Patient's glucose has now increased to 238 mg/dl this am. Please consider ordering Half of basal insulin home dose, Lantus 5 units Q24 hrs.   Thanks,  Christena Deem RN, MSN, [facility] Inpatient Diabetes Coordinator Team Pager [phone]

## 2014-06-26 NOTE — Progress Notes (Signed)
[facility] PROGRESS NOTE  Elizabeth [name] WJX:[id number] DOB: [birth year] DOA: 06/25/2014 PCP: [name], MD  Assessment/Plan: #1 Hypothermia: Possible sepsis, though no clear source identified. She is noted to have abnormal UA but denies any symptoms. Other consideration, could be hypoglycemia -will Follow urine culture and Follow-up blood cultures. -Lactic acid level WNL after IVF's -no further hypoglycemia -temp back to normal with warming blankets -will move out of step down -will continue current antibiotics for another 24 hours and deescalate/discontinue if appropriate  #2 Possible sepsis with abnormal UA: no dysuria -cultures pending -patient procalcitonin 0.5 (borderline to suggest systemic bacterial infection) -will continue current antibiotics as patient has improved dramatically to sepsis protocol -if no features of infection present by tomorrow will discontinue abx's  #3 Thrombocytopenia: Etiology is unclear. No overt bleeding.  -Continue to monitor counts. Questionable if related to presumed sepsis vs heparin use during HD treatments  #4 hypoglycemia in the setting of known diabetes: brittle diabetes -most likely her insuline needs has now declines after been on HD and significant decrease in weight -last HbA1c was 6.7 back in April.  -follow repeated A1C -CBG's now with glucose in 200-230 range; will start SSI  #5 history of nonischemic cardiomyopathy: EF is around 30%. She had a cardiac catheterization in February, which did not show any significant disease.  -Volume status being managed through dialysis.  #6 history of end-stage renal disease on hemodialysis and secondary hyperparathyroidism: She gets dialyzed on Tuesday, Thursday, Saturday.  -Nephrology has been notified to continue HD treatments while inpatient -continue sevelamer   #7 Recent left below-knee amputation due to peripheral vascular disease: Stable.  -Left LE stump shows no evidence of  infection.  -She supposed to have a prosthesis fitting in the near future.  -PT and OT to see and assess capacity to perform ADL's.  #8 severe protein calorie malnutrition -Body mass index is 15 kg/(m^2). -will start nepro TID -will consult dietitian  #9 GERD: continue PPI  #10 decubitus ulcer: will ask wound care nurse to assess and provide rec's  #11 HLD: continue statins   Code Status: Full Family Communication: no family at bedside  Disposition Plan: will transfer out of step down and ask PT/OT to evaluate patient   Consultants:  Renal service  Procedures: See below for x-ray reports   Antibiotics:  Vancomycin 5/4  Cefepime 5/4  HPI/Subjective: No fever, no further episodes of hypothermia; patient is AAOX3 and without any acute complaints. CBG's now in the 200-230 range. Still slightly tachycardic   Objective: Filed Vitals:   06/26/14 0751  BP: 101/62  Pulse: 101  Temp: 98.4 F (36.9 C)  Resp: 21    Intake/Output Summary (Last 24 hours) at 06/26/14 0934 Last data filed at 06/26/14 0600  Gross per 24 hour  Intake 2191.17 ml  Output    250 ml  Net 1941.17 ml   Filed Weights   06/25/14 1739  Weight: 38.4 kg (84 lb 10.5 oz)    Exam:   General:  Frail, elderly and underweight woman; Afebrile, no CP, no SOB; AAOX3 and without acute complaints.  Cardiovascular: sinus tachycardia, no rubs or gallops; no JVD  Respiratory: no wheezing, no crackles  Abdomen: soft, NT, ND, positive BS  Musculoskeletal: trace edema bilaterally; patient with left BKA  Skin: decubitus ulcer stage 3; no erythema around wound and no purulent discharge   Data Reviewed: Basic Metabolic Panel:  Recent Labs Lab 06/25/14 1348 06/26/14 0258  NA 135 134*  K 5.1 5.1  CL  95* 98*  CO2 23 26  GLUCOSE 170* 180*  BUN 40* 47*  CREATININE 3.64* 3.84*  CALCIUM 9.3 8.1*   Liver Function Tests:  Recent Labs Lab 06/25/14 1348 06/26/14 0258  AST 51* 23  ALT 36 25   ALKPHOS 141* 105  BILITOT 1.0 0.3  PROT 8.7* 5.9*  ALBUMIN 3.4* 2.2*   CBC:  Recent Labs Lab 06/25/14 1348 06/26/14 0258  WBC 7.2 9.8  NEUTROABS 6.4  --   HGB 16.1* 12.1  HCT 49.9* 37.6  MCV 83.4 82.3  PLT 86* 83*   ProBNP (last 3 results) No results for input(s): PROBNP in the last 8760 hours.  CBG:  Recent Labs Lab 06/25/14 2010 06/25/14 2158 06/26/14 0042 06/26/14 0415 06/26/14 0750  GLUCAP 184* 219* 200* 153* 238*    Recent Results (from the past 240 hour(s))  Blood culture (routine x 2)     Status: None (Preliminary result)   Collection Time: 06/25/14  1:36 PM  Result Value Ref Range Status   Specimen Description BLOOD ARM RIGHT  Final   Special Requests BOTTLES DRAWN AEROBIC ONLY 5CC  Final   Culture   Final           BLOOD CULTURE RECEIVED NO GROWTH TO DATE CULTURE WILL BE HELD FOR 5 DAYS BEFORE ISSUING A FINAL NEGATIVE REPORT Performed at [facility]    Report Status PENDING  Incomplete  Blood culture (routine x 2)     Status: None (Preliminary result)   Collection Time: 06/25/14  1:48 PM  Result Value Ref Range Status   Specimen Description BLOOD RIGHT FOREARM  Final   Special Requests BOTTLES DRAWN AEROBIC AND ANAEROBIC 3CC  Final   Culture   Final           BLOOD CULTURE RECEIVED NO GROWTH TO DATE CULTURE WILL BE HELD FOR 5 DAYS BEFORE ISSUING A FINAL NEGATIVE REPORT Performed at [facility]    Report Status PENDING  Incomplete     Studies: Dg Chest Port 1 View  06/25/2014   CLINICAL DATA:  69 year old female with cough and hypoglycemia. Initial encounter.  EXAM: PORTABLE CHEST - 1 VIEW  COMPARISON:  Chest radiographs 06/12/2014 and earlier.  FINDINGS: Portable AP semi upright view at 1604 hours. Interval regressed bilateral pleural effusions and improved bibasilar ventilation. Stable cardiomegaly and mediastinal contours. Stable tunneled right chest dual lumen dialysis type catheter. No pneumothorax. No acute pulmonary edema. No  consolidation.  IMPRESSION: Interval regressed pleural effusions and improved bibasilar ventilation. No new cardiopulmonary abnormality.   Electronically Signed   By: Odessa Fleming M.D.   On: 06/25/2014 16:10    Scheduled Meds: . aspirin  81 mg Oral Daily  . feeding supplement (NEPRO CARB STEADY)  237 mL Oral Q1400  . insulin aspart  0-9 Units Subcutaneous TID WC  . pantoprazole  40 mg Oral Daily  . piperacillin-tazobactam (ZOSYN)  IV  2.25 g Intravenous 4 times per day  . pravastatin  20 mg Oral Daily  . saccharomyces boulardii  250 mg Oral BID  . sevelamer carbonate  800 mg Oral TID WC  . sodium chloride  3 mL Intravenous Q12H  . 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 If 7PM-7AM, please contact night-coverage at [email or web address], password [facility] 06/26/2014, 9:34 AM  LOS: 1 day

## 2014-06-27 ENCOUNTER — Encounter: Payer: Medicare Other | Admitting: Vascular Surgery

## 2014-06-27 LAB — RENAL FUNCTION PANEL
ALBUMIN: 2.1 g/dL — AB (ref 3.5–5.0)
ANION GAP: 9 (ref 5–15)
BUN: 30 mg/dL — ABNORMAL HIGH (ref 6–20)
CHLORIDE: 100 mmol/L — AB (ref 101–111)
CO2: 25 mmol/L (ref 22–32)
CREATININE: 2.89 mg/dL — AB (ref 0.44–1.00)
Calcium: 8 mg/dL — ABNORMAL LOW (ref 8.9–10.3)
GFR, EST AFRICAN AMERICAN: 18 mL/min — AB (ref >60)
GFR, EST NON AFRICAN AMERICAN: 15 mL/min — AB (ref >60)
Glucose, Bld: 233 mg/dL — ABNORMAL HIGH (ref 70–99)
POTASSIUM: 5.2 mmol/L — AB (ref 3.5–5.1)
Phosphorus: 3.8 mg/dL (ref 2.5–4.6)
SODIUM: 134 mmol/L — AB (ref 135–145)

## 2014-06-27 LAB — HEMOGLOBIN A1C
Hgb A1c MFr Bld: 6.7 % — ABNORMAL HIGH (ref 4.8–5.6)
Mean Plasma Glucose: 146 mg/dL

## 2014-06-27 LAB — CBC
HEMATOCRIT: 37.4 % (ref 36.0–46.0)
Hemoglobin: 11.9 g/dL — ABNORMAL LOW (ref 12.0–15.0)
MCH: 26.5 pg (ref 26.0–34.0)
MCHC: 31.8 g/dL (ref 30.0–36.0)
MCV: 83.3 fL (ref 78.0–100.0)
PLATELETS: 84 10*3/uL — AB (ref 150–400)
RBC: 4.49 MIL/uL (ref 3.87–5.11)
RDW: 19.8 % — ABNORMAL HIGH (ref 11.5–15.5)
WBC: 7.1 10*3/uL (ref 4.0–10.5)

## 2014-06-27 LAB — URINE CULTURE: Colony Count: >=100000

## 2014-06-27 LAB — CULTURE, BLOOD (ROUTINE X 2)

## 2014-06-27 LAB — GLUCOSE, CAPILLARY
GLUCOSE-CAPILLARY: 148 mg/dL — AB (ref 70–99)
Glucose-Capillary: 242 mg/dL — ABNORMAL HIGH (ref 70–99)
Glucose-Capillary: 254 mg/dL — ABNORMAL HIGH (ref 70–99)
Glucose-Capillary: 128 mg/dL — ABNORMAL HIGH (ref 70–99)

## 2014-06-27 MED ORDER — VANCOMYCIN HCL 500 MG IV SOLR: 500.0000 mg | INTRAVENOUS | Status: DC

## 2014-06-27 MED ORDER — HEPARIN SODIUM (PORCINE) 1000 UNIT/ML DIALYSIS: 2000.0000 [IU] | Freq: Once | INTRAMUSCULAR | Status: DC

## 2014-06-27 MED ORDER — SODIUM CHLORIDE 0.9 % IV SOLN: 100.0000 mL | INTRAVENOUS | Status: DC | PRN

## 2014-06-27 MED ORDER — NEPRO/CARBSTEADY PO LIQD: 237.0000 mL | ORAL | Status: DC | PRN

## 2014-06-27 MED ORDER — ALTEPLASE 2 MG IJ SOLR: 2.0000 mg | Freq: Once | INTRAMUSCULAR | Status: AC | PRN

## 2014-06-27 MED ORDER — HEPARIN SODIUM (PORCINE) 1000 UNIT/ML DIALYSIS: 1000.0000 [IU] | INTRAMUSCULAR | Status: DC | PRN

## 2014-06-27 MED ORDER — DOXERCALCIFEROL 4 MCG/2ML IV SOLN
1.0000 ug | INTRAVENOUS | Status: DC
  Administered 2014-06-28: 1 ug via INTRAVENOUS
  Filled 2014-06-27: qty 2

## 2014-06-27 MED ORDER — LIDOCAINE-PRILOCAINE 2.5-2.5 % EX CREA: 1.0000 "application " | TOPICAL_CREAM | CUTANEOUS | Status: DC | PRN

## 2014-06-27 MED ORDER — PENTAFLUOROPROP-TETRAFLUOROETH EX AERO: 1.0000 "application " | INHALATION_SPRAY | CUTANEOUS | Status: DC | PRN

## 2014-06-27 MED ORDER — LIDOCAINE HCL (PF) 1 % IJ SOLN: 5.0000 mL | INTRAMUSCULAR | Status: DC | PRN

## 2014-06-27 NOTE — Progress Notes (Signed)
This is a late entry.  During patient's skin assessment this RN noted patient to have 3 reddened wounds with scabs around her left BKA incision. All wounds were clean, dry, and intact. No drainage noted. BKA incision is well healed and approximated.  Leanna Battles, RN.

## 2014-06-27 NOTE — Progress Notes (Signed)
Subjective:   Reports Feeling much better, says she took too much lantus when she was at home. [facility].   Objective Filed Vitals:   06/26/14 1807 06/26/14 2037 06/27/14 0441 06/27/14 0809  BP: 101/62 98/57 100/61 107/65  Pulse: 96 106 109 100  Temp: 97.9 F (36.6 C) 98.6 F (37 C) 97.8 F (36.6 C) 97.9 F (36.6 C)  TempSrc: Oral Oral Oral Oral  Resp: 17 18 17 16   Height:      Weight:      SpO2: 99% 99% 98% 100%   Physical Exam General: alert and oriented. No acute distress  Heart: RRR  Lungs: CTA, unlabored.  Abdomen: soft, nontender. Thin +BS  Extremities: L BKA. No edema  Dialysis Access: L AVF +b/t   HD[facility] F160 34kg 2/2.25 bath P4 LUA AVF and R IJ cath (using 1 and 1) Heparin 2000 Hect 1 ug tiw  Assessment/Plan: 1. Hypoglycemia- reports taking too much insulin at home and not eating. AM glucose 148  A1c 6.7 2. Sepsis- Vanc and zosyn 1/2 + gram + cocci. Abnormal UA with foley cath- culture pending. Afebrile.  2. ESRD - TTS [place]. HD tomorrow.  3. Anemia -  hgb 12.1 no meds, watch CBC 4. Secondary hyperparathyroidism - corrected Ca+ 9.5 cont hectotol and renvela 5. HTN/volume - 107/65 no meds. no edema 6. Nutrition -  Alb 2.2 supplements. Renal diet 7. Decub ulcer- per wound care 8. Cardiomyopathy- EF 30% 9  Dispo- [facility] then to [facility] - had been living at home by herself after Dcd from SNF last week  Jetty Duhamel, NP [employer] [phone] 06/27/2014,9:52 AM  LOS: 2 days   Pt seen, examined and agree w A/P as above.  Vinson Moselle MD pager [phone]    cell [phone] 06/27/2014, 11:17 AM    Additional Objective Labs: Basic Metabolic Panel:  Recent Labs Lab 06/25/14 1348 06/26/14 0258  NA 135 134*  K 5.1 5.1  CL 95* 98*  CO2 23 26  GLUCOSE 170* 180*  BUN 40* 47*  CREATININE 3.64* 3.84*  CALCIUM 9.3 8.1*   Liver Function Tests:  Recent  Labs Lab 06/25/14 1348 06/26/14 0258  AST 51* 23  ALT 36 25  ALKPHOS 141* 105  BILITOT 1.0 0.3  PROT 8.7* 5.9*  ALBUMIN 3.4* 2.2*   No results for input(s): LIPASE, AMYLASE in the last 168 hours. CBC:  Recent Labs Lab 06/25/14 1348 06/26/14 0258  WBC 7.2 9.8  NEUTROABS 6.4  --   HGB 16.1* 12.1  HCT 49.9* 37.6  MCV 83.4 82.3  PLT 86* 83*   Blood Culture    Component Value Date/Time   SDES URINE, CATHETERIZED 06/25/2014 1436   SPECREQUEST NONE 06/25/2014 1436   CULT  06/25/2014 1436    Culture reincubated for better growth Performed at [facility]    REPTSTATUS PENDING 06/25/2014 1436    Cardiac Enzymes: No results for input(s): CKTOTAL, CKMB, CKMBINDEX, TROPONINI in the last 168 hours. CBG:  Recent Labs Lab 06/26/14 0750 06/26/14 1134 06/26/14 1802 06/26/14 2036 06/27/14 0730  GLUCAP 238* 240* 90 174* 148*   Iron Studies: No results for input(s): IRON, TIBC, TRANSFERRIN, FERRITIN in the last 72 hours. @lablastinr3 @ Studies/Results: Dg Chest Port 1 View  06/25/2014   CLINICAL DATA:  69 year old female with cough and hypoglycemia. Initial encounter.  EXAM: PORTABLE CHEST - 1 VIEW  COMPARISON:  Chest radiographs 06/12/2014 and earlier.  FINDINGS: Portable AP semi upright view at 1604 hours. Interval regressed bilateral pleural effusions and improved bibasilar ventilation. Stable cardiomegaly and mediastinal contours. Stable tunneled right chest dual lumen dialysis type catheter. No pneumothorax. No acute pulmonary edema. No consolidation.  IMPRESSION: Interval regressed pleural effusions and improved bibasilar ventilation. No new cardiopulmonary abnormality.   Electronically Signed   By: Odessa Fleming M.D.   On: 06/25/2014 16:10   Medications:   . aspirin  81 mg Oral Daily  . feeding supplement (NEPRO CARB STEADY)  237 mL Oral Q1400  . feeding supplement (PRO-STAT SUGAR FREE 64)  30 mL Oral TID WC  . insulin aspart  0-9 Units Subcutaneous TID WC  .  pantoprazole  40 mg Oral Daily  . piperacillin-tazobactam (ZOSYN)  IV  2.25 g Intravenous 3 times per day  . pravastatin  20 mg Oral Daily  . saccharomyces boulardii  250 mg Oral BID  . sevelamer carbonate  800 mg Oral TID WC  . sodium chloride  3 mL Intravenous Q12H  . vancomycin  500 mg Intravenous Q48H

## 2014-06-27 NOTE — Clinical Social Work Note (Signed)
CSW talked with [facility] [place] regarding patient and advised not ready for d/c today. Per Tammy, patient had been at their facility, [facility], but went home because she did not want to pay the patient monthly liability (turn her check over to facility). Per Tammy patient used all of her Medicare days. She learned that home health personnel found patient down in her home and she was brought to hospital. Per Tammy, patient can return to [facility] when she is ready for d/c. CSW advised by Tammy that patient does have Medicaid.  Genelle Bal, MSW, LCSW Licensed Clinical Social Worker Clinical Social Work Department [employer] [phone]

## 2014-06-27 NOTE — Progress Notes (Signed)
[facility] PROGRESS NOTE  Elizabeth [name] OIP:[id number] DOB: Jun 16, 1945 DOA: 06/25/2014 PCP: [name], MD  Assessment/Plan: #1 Hypothermia: Possible sepsis, though no clear source identified. She is noted to have abnormal UA but denies any symptoms. Other consideration, could be hypoglycemia -will Follow urine culture  -Lactic acid level WNL after IVF's -no further hypoglycemia -temp back to normal with warming blankets; no further hypothermia episodes -will need SNF at discharge; hopefully in 1-2 days -will continue zosyn for potential UTI  #2 Possible sepsis with abnormal UA: no dysuria. Patient with 1/2 coagulase neg staph -will wait for urine cx's -disocntinue vancomycin, patient is non-toxic and blood cx's representing a contaminant -will continue zosyn for now -if no features of infection and neg urine cx's will discontinue abx's  #3 Thrombocytopenia: Etiology is unclear. No overt bleeding.  -Continue to monitor counts. Questionable if related to presumed sepsis vs heparin use during HD treatments  #4 hypoglycemia in the setting of known diabetes: brittle diabetes -most likely her insuline needs has now declines after been on HD and significant decrease in weight -last HbA1c was 6.7 back in April.  -repeated A1C 6.7 -CBG's now with glucose in 130-140 -will continue SSI  #5 history of nonischemic cardiomyopathy: EF is around 30%. She had a cardiac catheterization in February, which did not show any significant disease.  -Volume status being managed through dialysis.  #6 history of end-stage renal disease on hemodialysis and secondary hyperparathyroidism:  -continue HD on Tuesday, Thursday, Saturday.  -Nephrology has been consulted. 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  -Left LE stump shows no evidence of infection.  -She supposed to have a prosthesis fitting in the near future; this  can be readdressed as an outpatient.  -per PT/OT rec's patient will benefit of SNF at discharge  #8 severe protein calorie malnutrition -Body mass index is 14.22 kg/(m^2). -will continue nepro TID -will follow dietitian rec's  #9 GERD: continue PPI  #10 decubitus ulcer: will follow wound care nurse recommendations   #11 HLD: continue statins   Code Status: Full Family Communication: no family at bedside  Disposition Plan: will transfer out of step down and ask PT/OT to evaluate patient   Consultants:  Renal service  Procedures: See below for x-ray reports   Antibiotics:  Vancomycin 5/4>>5/6  Zosyn 5/4  HPI/Subjective: No fever, no further episodes of hypothermia or hypoglycemia. Feeling good and w/o acute complaints.  Objective: Filed Vitals:   06/27/14 0809  BP: 107/65  Pulse: 100  Temp: 97.9 F (36.6 C)  Resp: 16    Intake/Output Summary (Last 24 hours) at 06/27/14 1407 Last data filed at 06/27/14 1349  Gross per 24 hour  Intake    919 ml  Output   1611 ml  Net   -692 ml   Filed Weights   06/25/14 1739 06/26/14 1430 06/26/14 1742  Weight: 38.4 kg (84 lb 10.5 oz) 37.7 kg (83 lb 1.8 oz) 36.4 kg (80 lb 4 oz)    Exam:   General:  Frail, elderly and underweight woman; Afebrile, no CP, no SOB; AAOX3 and without acute complaints.  Cardiovascular: mild sinus tachycardia, no rubs or gallops; no JVD  Respiratory: no wheezing, no crackles  Abdomen: soft, NT, ND, positive BS  Musculoskeletal: trace edema bilaterally; patient with left BKA  Skin: decubitus ulcer stage 3; no erythema around wound and no purulent discharge   Data Reviewed: Basic Metabolic Panel:  Recent Labs Lab  06/25/14 1348 06/26/14 0258 06/27/14 1044  NA 135 134* 134*  K 5.1 5.1 5.2*  CL 95* 98* 100*  CO2 GLUCOSE 170* 180* 233*  BUN 40* 47* 30*  CREATININE 3.64* 3.84* 2.89*  CALCIUM 9.3 8.1* 8.0*  PHOS  --   --  3.8   Liver Function Tests:  Recent Labs Lab  06/25/14 1348 06/26/14 0258 06/27/14 1044  AST 51* 23  --   ALT 36 25  --   ALKPHOS 141* 105  --   BILITOT 1.0 0.3  --   PROT 8.7* 5.9*  --   ALBUMIN 3.4* 2.2* 2.1*   CBC:  Recent Labs Lab 06/25/14 1348 06/26/14 0258 06/27/14 1044  WBC 7.2 9.8 7.1  NEUTROABS 6.4  --   --   HGB 16.1* 12.1 11.9*  HCT 49.9* 37.6 37.4  MCV 83.4 82.3 83.3  PLT 86* 83* 84*   CBG:  Recent Labs Lab 06/26/14 1134 06/26/14 1802 06/26/14 2036 06/27/14 0730 06/27/14 1122  GLUCAP 240* 90 174* 148* 242*    Recent Results (from the past 240 hour(s))  Blood culture (routine x 2)     Status: None   Collection Time: 06/25/14  1:36 PM  Result Value Ref Range Status   Specimen Description BLOOD ARM RIGHT  Final   Special Requests BOTTLES DRAWN AEROBIC ONLY 5CC  Final   Culture   Final    STAPHYLOCOCCUS SPECIES (COAGULASE NEGATIVE) Note: THE SIGNIFICANCE OF ISOLATING THIS ORGANISM FROM A SINGLE SET OF BLOOD CULTURES WHEN MULTIPLE SETS ARE DRAWN IS UNCERTAIN. PLEASE NOTIFY THE MICROBIOLOGY DEPARTMENT WITHIN ONE WEEK IF SPECIATION AND SENSITIVITIES ARE REQUIRED. Note: Gram Stain Report Called to,Read Back By and Verified With: CYBILL ECKELMANN ON 5.5.16 AT 6:34P BY WILEJ Performed at [facility]    Report Status 06/27/2014 FINAL  Final  Blood culture (routine x 2)     Status: None (Preliminary result)   Collection Time: 06/25/14  1:48 PM  Result Value Ref Range Status   Specimen Description BLOOD RIGHT FOREARM  Final   Special Requests BOTTLES DRAWN AEROBIC AND ANAEROBIC 3CC  Final   Culture   Final           BLOOD CULTURE RECEIVED NO GROWTH TO DATE CULTURE WILL BE HELD FOR 5 DAYS BEFORE ISSUING A FINAL NEGATIVE REPORT Performed at [facility]    Report Status PENDING  Incomplete  Urine culture     Status: None (Preliminary result)   Collection Time: 06/25/14  2:36 PM  Result Value Ref Range Status   Specimen Description URINE, CATHETERIZED  Final   Special Requests NONE   Final   Culture   Final    Culture reincubated for better growth Performed at [facility]    Report Status PENDING  Incomplete     Studies: Dg Chest Port 1 View  06/25/2014   CLINICAL DATA:  69 year old female with cough and hypoglycemia. Initial encounter.  EXAM: PORTABLE CHEST - 1 VIEW  COMPARISON:  Chest radiographs 06/12/2014 and earlier.  FINDINGS: Portable AP semi upright view at 1604 hours. Interval regressed bilateral pleural effusions and improved bibasilar ventilation. Stable cardiomegaly and mediastinal contours. Stable tunneled right chest dual lumen dialysis type catheter. No pneumothorax. No acute pulmonary edema. No consolidation.  IMPRESSION: Interval regressed pleural effusions and improved bibasilar ventilation. No new cardiopulmonary abnormality.   Electronically Signed   By: Odessa Fleming M.D.   On: 06/25/2014 16:10    Scheduled  Meds: . aspirin  81 mg Oral Daily  . [START ON 06/28/2014] doxercalciferol  1 mcg Intravenous Q T,Th,Sa-HD  . feeding supplement (NEPRO CARB STEADY)  237 mL Oral Q1400  . feeding supplement (PRO-STAT SUGAR FREE 64)  30 mL Oral TID WC  . [START ON 06/28/2014] heparin  2,000 Units Dialysis Once in dialysis  . insulin aspart  0-9 Units Subcutaneous TID WC  . pantoprazole  40 mg Oral Daily  . piperacillin-tazobactam (ZOSYN)  IV  2.25 g Intravenous 3 times per day  . pravastatin  20 mg Oral Daily  . saccharomyces boulardii  250 mg Oral BID  . sevelamer carbonate  800 mg Oral TID WC  . sodium chloride  3 mL Intravenous Q12H  . [START ON 06/28/2014] vancomycin  500 mg Intravenous Q T,Th,Sa-HD   Continuous Infusions:   Principal Problem:   Hypothermia Active Problems:   Hypoglycemia   DM type 2 (diabetes mellitus, type 2)   LVF 30%   Moderate 3V CAD at cath 03/24/14   ESRD on dialysis   S/P BKA (below knee amputation) unilateral   Lactic acidosis    Time spent: 30 minutes     Vassie Loll  [facility] Pager [phone]. If 7PM-7AM,  please contact night-coverage at [email or web address], password [email or web address] 06/27/2014, 2:07 PM  LOS: 2 days

## 2014-06-27 NOTE — Progress Notes (Addendum)
ANTIBIOTIC CONSULT NOTE - FOLLOW UP  Pharmacy Consult for Vancomycin + Zosyn Indication: r/o sepsis  No Known Allergies  Patient Measurements: Height:  (160 cm) Weight: 80 lb 4 oz (36.4 kg) IBW/kg (Calculated) : 52.4  Vital Signs: Temp: 97.9 F (36.6 C) (05/06 0809) Temp Source: Oral (05/06 0809) BP: 107/65 mmHg (05/06 0809) Pulse Rate: 100 (05/06 0809) Intake/Output from previous day: 05/05 0701 - 05/06 0700 In: 322 [P.O.:222; IV Piggyback:100] Out: 1610 [Urine:110] Intake/Output from this shift: Total I/O In: 477 [P.O.:477] Out: -   Labs:  Recent Labs  06/25/14 1348 06/26/14 0258  WBC 7.2 9.8  HGB 16.1* 12.1  PLT 86* 83*  CREATININE 3.64* 3.84*   Estimated Creatinine Clearance: 7.8 mL/min (by C-G formula based on Cr of 3.84). No results for input(s): VANCOTROUGH, VANCOPEAK, VANCORANDOM, GENTTROUGH, GENTPEAK, GENTRANDOM, TOBRATROUGH, TOBRAPEAK, TOBRARND, AMIKACINPEAK, AMIKACINTROU, AMIKACIN in the last 72 hours.   Microbiology: Recent Results (from the past 720 hour(s))  Culture, Urine     Status: None   Collection Time: 06/12/14  1:00 PM  Result Value Ref Range Status   Specimen Description URINE, RANDOM  Final   Special Requests NONE  Final   Colony Count   Final    >=100,000 COLONIES/ML Performed at [facility]    Culture   Final    Multiple bacterial morphotypes present, none predominant. Suggest appropriate recollection if clinically indicated. Performed at [facility]    Report Status 06/13/2014 FINAL  Final  MRSA PCR Screening     Status: None   Collection Time: 06/12/14  7:16 PM  Result Value Ref Range Status   MRSA by PCR NEGATIVE NEGATIVE Final    Comment:        The GeneXpert MRSA Assay (FDA approved for NASAL specimens only), is one component of a comprehensive MRSA colonization surveillance program. It is not intended to diagnose MRSA infection nor to guide or monitor treatment for MRSA infections.   Blood  culture (routine x 2)     Status: None   Collection Time: 06/25/14  1:36 PM  Result Value Ref Range Status   Specimen Description BLOOD ARM RIGHT  Final   Special Requests BOTTLES DRAWN AEROBIC ONLY 5CC  Final   Culture   Final    STAPHYLOCOCCUS SPECIES (COAGULASE NEGATIVE) Note: THE SIGNIFICANCE OF ISOLATING THIS ORGANISM FROM A SINGLE SET OF BLOOD CULTURES WHEN MULTIPLE SETS ARE DRAWN IS UNCERTAIN. PLEASE NOTIFY THE MICROBIOLOGY DEPARTMENT WITHIN ONE WEEK IF SPECIATION AND SENSITIVITIES ARE REQUIRED. Note: Gram Stain Report Called to,Read Back By and Verified With: CYBILL ECKELMANN ON 5.5.16 AT 6:34P BY WILEJ Performed at [facility]    Report Status 06/27/2014 FINAL  Final  Blood culture (routine x 2)     Status: None (Preliminary result)   Collection Time: 06/25/14  1:48 PM  Result Value Ref Range Status   Specimen Description BLOOD RIGHT FOREARM  Final   Special Requests BOTTLES DRAWN AEROBIC AND ANAEROBIC 3CC  Final   Culture   Final           BLOOD CULTURE RECEIVED NO GROWTH TO DATE CULTURE WILL BE HELD FOR 5 DAYS BEFORE ISSUING A FINAL NEGATIVE REPORT Performed at [facility]    Report Status PENDING  Incomplete  Urine culture     Status: None (Preliminary result)   Collection Time: 06/25/14  2:36 PM  Result Value Ref Range Status   Specimen Description URINE, CATHETERIZED  Final   Special Requests  NONE  Final   Culture   Final    Culture reincubated for better growth Performed at [facility]    Report Status PENDING  Incomplete    Anti-infectives    Start     Dose/Rate Route Frequency Ordered Stop   06/28/14 1200  vancomycin (VANCOCIN) 500 mg in sodium chloride 0.9 % 100 mL IVPB     500 mg 100 mL/hr over 60 Minutes Intravenous Every T-Th-Sa (Hemodialysis) 06/27/14 1013     06/27/14 1700  vancomycin (VANCOCIN) 500 mg in sodium chloride 0.9 % 100 mL IVPB  Status:  Discontinued     500 mg 100 mL/hr over 60 Minutes Intravenous Every 48 hours  06/25/14 1720 06/27/14 1012   06/26/14 2000  piperacillin-tazobactam (ZOSYN) IVPB 2.25 g     2.25 g 100 mL/hr over 30 Minutes Intravenous 3 times per day 06/26/14 1338     06/26/14 1200  piperacillin-tazobactam (ZOSYN) IVPB 2.25 g  Status:  Discontinued     2.25 g 100 mL/hr over 30 Minutes Intravenous 4 times per day 06/25/14 1719 06/26/14 1338   06/25/14 1800  piperacillin-tazobactam (ZOSYN) IVPB 2.25 g  Status:  Discontinued     2.25 g 100 mL/hr over 30 Minutes Intravenous 4 times per day 06/25/14 1718 06/25/14 1719   06/25/14 1800  vancomycin (VANCOCIN) IVPB 1000 mg/200 mL premix     1,000 mg 200 mL/hr over 60 Minutes Intravenous  Once 06/25/14 1722 06/25/14 2052   06/25/14 1730  vancomycin (VANCOCIN) 500 mg in sodium chloride 0.9 % 100 mL IVPB  Status:  Discontinued     500 mg 100 mL/hr over 60 Minutes Intravenous Every 48 hours 06/25/14 1718 06/25/14 1720   06/25/14 1445  ceFEPIme (MAXIPIME) 1 g in dextrose 5 % 50 mL IVPB  Status:  Discontinued     1 g 100 mL/hr over 30 Minutes Intravenous Every 12 hours 06/25/14 1442 06/25/14 1717   06/25/14 1445  vancomycin (VANCOCIN) IVPB 1000 mg/200 mL premix  Status:  Discontinued     1,000 mg 200 mL/hr over 60 Minutes Intravenous  Once 06/25/14 1442 06/25/14 1722      Assessment: 69 YOF with ESRD-TTS who presented on 5/4 with hypothermia concerned for sepsis and was started on empiric Vancomycin + Zosyn.  The patient was loaded with Vancomycin 1g on 5/4, received hemodialysis on 5/5 evening with no maintenance dose given afterwards. Given the patient's low body weight and high loading dose - estimated levels remain appropriate for now. Noted 1/2 blood cultures are now growing GPC in clusters. Will plan to check a pre-HD Vancomycin level on 5/7 to determine appropriateness of doses.  Given the patient's low body weight - the patient may need to alternate between 250 mg and 500 mg maintenance doses to avoid getting levels too high. Will monitor  HD tolerance and check levels as needed to help with this assessment.   Goal of Therapy:  Pre-HD Vancomycin level of 15-25 mcg/ml  Plan:  1. Adjust Vancomycin to 500 mg IV post HD-TTS (for now - may need further adjustment later) 2. Obtain pre-HD Vancomycin level on 5/7 3. Continue Zosyn 2.25g IV every 8 hours 4. Will continue to follow HD schedule/duration, culture results, LOT, and antibiotic de-escalation plans   Georgina Pillion, PharmD, BCPS Clinical Pharmacist Pager: [phone] 06/27/2014 10:30 AM

## 2014-06-28 DIAGNOSIS — I5022 Chronic systolic (congestive) heart failure: Secondary | ICD-10-CM

## 2014-06-28 DIAGNOSIS — R6510 Systemic inflammatory response syndrome (SIRS) of non-infectious origin without acute organ dysfunction: Secondary | ICD-10-CM | POA: Insufficient documentation

## 2014-06-28 LAB — CBC
HEMATOCRIT: 37.7 % (ref 36.0–46.0)
Hemoglobin: 11.8 g/dL — ABNORMAL LOW (ref 12.0–15.0)
MCH: 26.3 pg (ref 26.0–34.0)
MCHC: 31.3 g/dL (ref 30.0–36.0)
MCV: 84.2 fL (ref 78.0–100.0)
Platelets: 103 10*3/uL — ABNORMAL LOW (ref 150–400)
RBC: 4.48 MIL/uL (ref 3.87–5.11)
RDW: 19.9 % — AB (ref 11.5–15.5)
WBC: 5.9 10*3/uL (ref 4.0–10.5)

## 2014-06-28 LAB — RENAL FUNCTION PANEL
Albumin: 2.2 g/dL — ABNORMAL LOW (ref 3.5–5.0)
Anion gap: 11 (ref 5–15)
BUN: 52 mg/dL — ABNORMAL HIGH (ref 6–20)
CO2: 26 mmol/L (ref 22–32)
CREATININE: 3.74 mg/dL — AB (ref 0.44–1.00)
Calcium: 8.3 mg/dL — ABNORMAL LOW (ref 8.9–10.3)
Chloride: 97 mmol/L — ABNORMAL LOW (ref 101–111)
GFR, EST AFRICAN AMERICAN: 13 mL/min — AB (ref >60)
GFR, EST NON AFRICAN AMERICAN: 11 mL/min — AB (ref >60)
Glucose, Bld: 86 mg/dL (ref 70–99)
POTASSIUM: 5.5 mmol/L — AB (ref 3.5–5.1)
Phosphorus: 5.4 mg/dL — ABNORMAL HIGH (ref 2.5–4.6)
SODIUM: 134 mmol/L — AB (ref 135–145)

## 2014-06-28 LAB — GLUCOSE, CAPILLARY
Glucose-Capillary: 88 mg/dL (ref 70–99)
Glucose-Capillary: 132 mg/dL — ABNORMAL HIGH (ref 70–99)

## 2014-06-28 LAB — VANCOMYCIN, TROUGH: Vancomycin Tr: 14 ug/mL (ref 10.0–20.0)

## 2014-06-28 MED ORDER — DOCUSATE SODIUM 50 MG PO CAPS: 50.0000 mg | ORAL_CAPSULE | Freq: Two times a day (BID) | ORAL | Status: DC

## 2014-06-28 MED ORDER — OXYCODONE-ACETAMINOPHEN 5-325 MG PO TABS: 1.0000 | ORAL_TABLET | Freq: Three times a day (TID) | ORAL | Status: DC | PRN

## 2014-06-28 MED ORDER — DOXERCALCIFEROL 4 MCG/2ML IV SOLN
INTRAVENOUS | Status: AC
  Administered 2014-06-28: 1 ug via INTRAVENOUS
  Filled 2014-06-28: qty 2

## 2014-06-28 MED ORDER — INSULIN GLARGINE 100 UNIT/ML ~~LOC~~ SOLN: 3.0000 [IU] | Freq: Every day | SUBCUTANEOUS | Status: DC

## 2014-06-28 MED ORDER — NEPRO/CARBSTEADY PO LIQD: 237.0000 mL | Freq: Two times a day (BID) | ORAL | Status: DC

## 2014-06-28 MED ORDER — ONDANSETRON HCL 4 MG/2ML IJ SOLN
INTRAMUSCULAR | Status: AC
  Administered 2014-06-28: 4 mg via INTRAVENOUS
  Filled 2014-06-28: qty 2

## 2014-06-28 NOTE — Progress Notes (Signed)
Subjective:   On HD , stable  Objective Filed Vitals:   06/28/14 0547 06/28/14 0750 06/28/14 0800 06/28/14 0830  BP: 90/56  Pulse: 105 105 100 95  Temp: 97.7 F (36.5 C) 98.4 F (36.9 C)    TempSrc:  Oral    Resp: 17 18    Height:      Weight:  38.5 kg (84 lb 14 oz)    SpO2: 97% 98%     Physical Exam General: alert and oriented. No acute distress  Heart: RRR  Lungs: CTA, unlabored.  Abdomen: soft, nontender. Thin +BS  Extremities: L BKA. No edema  Dialysis Access: L AVF +b/t   [facility] 3.5h F160 34kg 2/2.25 bath P4 LUA AVF and R IJ cath (using 1 and 1) Heparin 2000 Hect 1 ug tiw  Assessment/Plan: 1. Hypoglycemia- reports taking too much insulin at home and not eating. AM glucose 148  A1c 6.7 2. Sepsis- onzosyn 1/2 + gram + cocci, ?switch to po abx. UCx > 100k 2. ESRD - T[facility] [place] 3. Anemia -  hgb 12.1 4. Secondary hyperparathyroidism - corrected Ca+ 9.5 cont hectotol and renvela 5. HTN/volume - 107/65 no meds. no edema 6. Nutrition -  Alb 2.2 supplements. Renal diet 7. Decub ulcer- per wound care 8. Cardiomyopathy- EF 30% 9  Dispo - PT/OT seeing, back to SNF when ready. Stable for dc from renal standpoint   Vinson Moselle MD pager [phone]    cell [phone] 06/28/2014, 8:36 AM    Additional Objective Labs: Basic Metabolic Panel:  Recent Labs Lab 06/25/14 1348 06/26/14 0258 06/27/14 1044  NA 135 134* 134*  K 5.1 5.1 5.2*  CL 95* 98* 100*  CO2 GLUCOSE 170* 180* 233*  BUN 40* 47* 30*  CREATININE 3.64* 3.84* 2.89*  CALCIUM 9.3 8.1* 8.0*  PHOS  --   --  3.8   Liver Function Tests:  Recent Labs Lab 06/25/14 1348 06/26/14 0258 06/27/14 1044  AST 51* 23  --   ALT 36 25  --   ALKPHOS 141* 105  --   BILITOT 1.0 0.3  --   PROT 8.7* 5.9*  --   ALBUMIN 3.4* 2.2* 2.1*   No results for input(s): LIPASE, AMYLASE in the last 168 hours. CBC:  Recent Labs Lab 06/25/14 1348 06/26/14 0258 06/27/14 1044  WBC 7.2  9.8 7.1  NEUTROABS 6.4  --   --   HGB 16.1* 12.1 11.9*  HCT 49.9* 37.6 37.4  MCV 83.4 82.3 83.3  PLT 86* 83* 84*   Blood Culture    Component Value Date/Time   SDES URINE, CATHETERIZED 06/25/2014 1436   SPECREQUEST NONE 06/25/2014 1436   CULT  06/25/2014 1436    Multiple bacterial morphotypes present, none predominant. Suggest appropriate recollection if clinically indicated. Performed at [facility]    REPTSTATUS 06/27/2014 FINAL 06/25/2014 1436    Cardiac Enzymes: No results for input(s): CKTOTAL, CKMB, CKMBINDEX, TROPONINI in the last 168 hours. CBG:  Recent Labs Lab 06/26/14 2036 06/27/14 0730 06/27/14 1122 06/27/14 1622 06/27/14 2109  GLUCAP 174* 148* 242* 254* 128*   Iron Studies: No results for input(s): IRON, TIBC, TRANSFERRIN, FERRITIN in the last 72 hours. @ Studies/Results: No results found. Medications:   . aspirin  81 mg Oral Daily  . doxercalciferol  1 mcg Intravenous Q T,Th,Sa-HD  . feeding supplement (NEPRO CARB STEADY)  237 mL Oral Q1400  . feeding supplement (PRO-STAT SUGAR FREE 64)  30 mL Oral TID WC  . heparin  2,000 Units Dialysis Once in dialysis  . insulin aspart  0-9 Units Subcutaneous TID WC  . pantoprazole  40 mg Oral Daily  . piperacillin-tazobactam (ZOSYN)  IV  2.25 g Intravenous 3 times per day  . pravastatin  20 mg Oral Daily  . saccharomyces boulardii  250 mg Oral BID  . sevelamer carbonate  800 mg Oral TID WC  . sodium chloride  3 mL Intravenous Q12H

## 2014-06-28 NOTE — Progress Notes (Signed)
Karmela Nancarrow to be D/C'd Skilled nursing facility per MD order. Called report to facility RN.     Medication List    STOP taking these medications        carvedilol 6.25 MG tablet  Commonly known as:  COREG     HYDROGEL Gel     loratadine 10 MG tablet  Commonly known as:  CLARITIN      TAKE these medications        aspirin 81 MG tablet  Take 81 mg by mouth daily.     cetirizine 10 MG tablet  Commonly known as:  ZYRTEC  Take 10 mg by mouth as needed for allergies.     Darbepoetin Alfa 200 MCG/0.4ML Sosy injection  Commonly known as:  ARANESP  Inject 0.4 mLs (200 mcg total) into the vein every Thursday with hemodialysis.     DECUBI-VITE Caps  Take 2 capsules by mouth daily.     docusate sodium 50 MG capsule  Commonly known as:  COLACE  Take 1 capsule (50 mg total) by mouth 2 (two) times daily. Hold for diarrhea     doxercalciferol 4 MCG/2ML injection  Commonly known as:  HECTOROL  Inject 0.5 mLs (1 mcg total) into the vein Every Tuesday,Thursday,and Saturday with dialysis.     feeding supplement (NEPRO CARB STEADY) Liqd  Take 237 mLs by mouth 2 (two) times daily between meals.     feeding supplement (PRO-STAT SUGAR FREE 64) Liqd  Take 30 mLs by mouth 3 (three) times daily with meals.     glucose blood test strip  Commonly known as:  ONETOUCH VERIO  Use as instructed to check blood sugar 4 times per day dx code E13.10     insulin aspart 100 UNIT/ML injection  Commonly known as:  novoLOG  Inject 0-9 Units into the skin 3 (three) times daily with meals. Sliding scale CBG 70 - 120: 0 units CBG 121 - 150: 1 unit,  CBG 151 - 200: 2 units,  CBG 201 - 250: 3 units,  CBG 251 - 300: 5 units,  CBG 301 - 350: 7 units,  CBG 351 - 400: 9 units   CBG > 400: 9 units and notify your MD     insulin glargine 100 UNIT/ML injection  Commonly known as:  LANTUS  Inject 0.03 mLs (3 Units total) into the skin daily.  Start taking on:  06/29/2014     lidocaine-prilocaine cream   Commonly known as:  EMLA  Apply topically once.     ONETOUCH DELICA LANCETS FINE Misc  Use to check blood sugar 4 times per day     oxyCODONE-acetaminophen 5-325 MG per tablet  Commonly known as:  PERCOCET/ROXICET  Take 1 tablet by mouth every 8 (eight) hours as needed for moderate pain or severe pain.     pantoprazole 40 MG tablet  Commonly known as:  PROTONIX  Take 1 tablet (40 mg total) by mouth daily.     pravastatin 20 MG tablet  Commonly known as:  PRAVACHOL  Take 20 mg by mouth daily.     saccharomyces boulardii 250 MG capsule  Commonly known as:  FLORASTOR  Take 1 capsule (250 mg total) by mouth 2 (two) times daily.     sevelamer carbonate 800 MG tablet  Commonly known as:  RENVELA  Take 1 tablet (800 mg total) by mouth 3 (three) times daily with meals.        Filed Vitals:   06/28/14 1616  BP: 110/60  Pulse: 90  Temp: 97.9 F (36.6 C)  Resp: 18   IV catheter discontinued intact. Site without signs and symptoms of complications. Dressing and pressure applied. Pt denies pain at this time. No complaints noted.   Patient escorted via [facility] to [facility] 06/28/2014 4:45 PM

## 2014-06-28 NOTE — Discharge Instructions (Signed)
Wound care: stage 3 decubitus ulcer Cut small piece of Alginate ([place] # [id number]) and tuck into sacrum wound every day day, using swab to fill.  Cover with foam dressing.  (Change foam dressing Q 5 days or PRN soiling.) Moisten with NS to remove previous dressing.

## 2014-06-28 NOTE — Discharge Summary (Signed)
Physician Discharge Summary  Elizabeth [name] ZOX:[id number] DOB: [birth year] DOA: 06/25/2014  PCP: [name], MD  Admit date: 06/25/2014 Discharge date: 06/28/2014  Time spent: >30 minutes  Recommendations for Outpatient Follow-up:  Repeat CBC to follow platelets transient Reassess blood pressure and adjust medication as needed Please repeat a basic metabolic panel to follow patient's electrolytes Will require close follow-up of her CBGs and further adjustment to current hypoglycemic regimen  Discharge Diagnoses:  Principal Problem:   Hypothermia Active Problems:   Hypoglycemia   DM type 2 (diabetes mellitus, type 2)   LVF 30%   Moderate 3V CAD at cath 03/24/14   ESRD on dialysis   S/P BKA (below knee amputation) unilateral   Lactic acidosis   Discharge Condition: Stable and improved. Patient has been discharge for reconditioning and further care to [facility] facility.  Diet recommendation: Modified carbohydrates/renal diet  Filed Weights   06/27/14 2113 06/28/14 0750 06/28/14 1129  Weight: 38.1 kg (83 lb 15.9 oz) 38.5 kg (84 lb 14 oz) 37.5 kg (82 lb 10.8 oz)    History of present illness:  69 y.o. female with a past medical history of diabetes, on insulin, end-stage renal disease on hemodialysis on Tuesday, Thursday, Saturday, peripheral vascular disease, recent left below-knee amputation in March who came home from a skilled nursing facility this past Saturday. She also had a recent hospitalization for issues related to dysphagia and was found to have esophageal achalasia. She is supposed to follow-up with gastroenterologist, Dr. Elnoria Howard. Apparently last night she slipped off of her bed onto the floor. She may have passed out, but she is not sure. She was found this morning by her home health nurse and was brought into the hospital for further evaluation. She was found to be hypoglycemic by the EMS with blood sugars in the 30s. She was given D50. She was brought into the  hospital. She was found to have a temperature of 3F. Her mentation improved here. Blood sugar also improved. She denies any recent fever, chills. Denies any cough, nausea, vomiting, diarrhea, dysuria. Denies any joint pains. Her left stump is healing well. She tells me that while she was at the skilled nursing facility at on many occasion, she was found to have low blood sugar. Then she went to see her primary care physician who is also her endocrinologist. Her insulin regimen was changed. She thinks that she may have given herself too much insulin yesterday.  Hospital Course:  #1 Hypothermia: Possible sepsis, though no clear source identified. She is noted to have abnormal UA but denies any symptoms. Other consideration and most likely cause for her symptoms was hypoglycemia. -Lactic acid level WNL after IVF's -no further hypoglycemia appreciated with insulin dose adjustments -temp back to normal with warming blankets; no further hypothermic episodes -will discharge to SNF and no further antibiotics will be provided at this point -she received a total of 2 days of vancomycin and 3 days of zosyn   #2 Possible sepsis with abnormal UA: no dysuria. Patient with 1/2 coagulase neg staph which was categorized as contaminant -She received 2 days of IV vancomycin and also 3 days of Zosyn  -Patient Foley catheter was exchanged this admission  -At discharge no further antibiotics needed   #3 Thrombocytopenia: Etiology is unclear. No overt bleeding appreciated during admission.  -Continue to monitor counts. Questionable if related to presumed sepsis vs heparin use during HD treatments -Continue close follow-up of platelets trend in outpatient setting  #4 hypoglycemia in  the setting of known diabetes: brittle diabetes -most likely her insuline needs has now declines after been on HD and significant decrease in weight -last HbA1c was 6.7 -CBG's now with glucose in 130-140 -will continue SSI and low dose  Lantus  #5 history of nonischemic cardiomyopathy: EF is around 30%. She had a cardiac catheterization in February,2016;  which did not show any significant disease.  -Volume status being managed through dialysis. -EF 30-35% on 2-D echo January 2016 -Compensated and a stable -continue use of b-blocker and low sodium diet -will need daily weight checks   #6 history of end-stage renal disease on hemodialysis and secondary hyperparathyroidism:  -continue HD on Tuesday, Thursday, Saturday.  -Nephrology will continue actively following patient as an outpatient during hemodialysis treatment -continue sevelamer and Hectorol  #7 Recent left below-knee amputation due to peripheral vascular disease: Stable.  -Left LE stump shows no evidence of infection.  -She supposed to have a prosthesis fitting in the near future; this can be readdressed as an outpatient.  -per PT/OT rec's patient will benefit of SNF at discharge; she will be discharged to [facility] facility  #8 severe protein calorie malnutrition -Body mass index is 14.22 kg/(m^2). -will continue nepro and pro-stat  -Patient encouraged to improve by mouth intake for better nutrition and hydration.  #9 GERD: continue PPI  #10 decubitus ulcer: will follow wound care nurse recommendations and prevention measures  #11 HLD: continue statins   Procedures:  See below for x-ray reports  Consultations:  Renal service  Discharge Exam: Filed Vitals:   06/28/14 1159  BP: 106/61  Pulse: 99  Temp: 97.8 F (36.6 C)  Resp: 18    General: Frail, elderly and underweight woman; Afebrile, no CP, no SOB; AAOX3 and without acute complaints at discharge.   Cardiovascular:S1 and S2, no rubs or gallops; no JVD  Respiratory: no wheezing, no crackles And with good oxygen saturation on room air   Abdomen: soft, NT, ND, positive BS  Musculoskeletal: trace edema bilaterally; patient with left BKA  Skin: decubitus ulcer  stage 3; no erythema around wound and no purulent discharge   Discharge Instructions   Discharge Instructions    Diet - low sodium heart healthy    Complete by:  As directed      Discharge instructions    Complete by:  As directed   Please make sure patient had adequate hydration Modified carbohydrates/renal diet Patient foley needs to be exchanged once a month (every 3-4 weeks); last time exchanged 06/28/14 Close follow up to CBG's Patient to follow with PCP in 10 days Wound care follow up at facility Physical rehabilitation as per facility protocol     Discharge wound care:    Complete by:  As directed   Cut small piece of Alginate [place] # [phone]) and tuck into sacrum wound every day day, using swab to fill.  Cover with foam dressing.  (Change foam dressing Q 5 days or PRN soiling.) Moisten with NS to remove previous dressing.          Current Discharge Medication List    CONTINUE these medications which have CHANGED   Details  docusate sodium (COLACE) 50 MG capsule Take 1 capsule (50 mg total) by mouth 2 (two) times daily. Hold for diarrhea    insulin glargine (LANTUS) 100 UNIT/ML injection Inject 0.03 mLs (3 Units total) into the skin daily. Qty: 10 mL, Refills: 11    Nutritional Supplements (FEEDING SUPPLEMENT, NEPRO CARB STEADY,) LIQD Take  237 mLs by mouth 2 (two) times daily between meals.    oxyCODONE-acetaminophen (PERCOCET/ROXICET) 5-325 MG per tablet Take 1 tablet by mouth every 8 (eight) hours as needed for moderate pain or severe pain. Qty: 10 tablet, Refills: 0      CONTINUE these medications which have NOT CHANGED   Details  aspirin 81 MG tablet Take 81 mg by mouth daily.    Darbepoetin Alfa (ARANESP) 200 MCG/0.4ML SOSY injection Inject 0.4 mLs (200 mcg total) into the vein every Thursday with hemodialysis. Qty: 1.68 mL    doxercalciferol (HECTOROL) 4 MCG/2ML injection Inject 0.5 mLs (1 mcg total) into the vein Every Tuesday,Thursday,and Saturday with  dialysis. Qty: 2 mL    glucose blood (ONETOUCH VERIO) test strip Use as instructed to check blood sugar 4 times per day dx code E13.10 Qty: 150 each, Refills: 3    insulin aspart (NOVOLOG) 100 UNIT/ML injection Inject 0-9 Units into the skin 3 (three) times daily with meals. Sliding scale CBG 70 - 120: 0 units CBG 121 - 150: 1 unit,  CBG 151 - 200: 2 units,  CBG 201 - 250: 3 units,  CBG 251 - 300: 5 units,  CBG 301 - 350: 7 units,  CBG 351 - 400: 9 units   CBG > 400: 9 units and notify your MD Qty: 10 mL, Refills: 11    ONETOUCH DELICA LANCETS FINE MISC Use to check blood sugar 4 times per day Qty: 200 each, Refills: 3    pantoprazole (PROTONIX) 40 MG tablet Take 1 tablet (40 mg total) by mouth daily.    saccharomyces boulardii (FLORASTOR) 250 MG capsule Take 1 capsule (250 mg total) by mouth 2 (two) times daily.    sevelamer carbonate (RENVELA) 800 MG tablet Take 1 tablet (800 mg total) by mouth 3 (three) times daily with meals.    Amino Acids-Protein Hydrolys (FEEDING SUPPLEMENT, PRO-STAT SUGAR FREE 64,) LIQD Take 30 mLs by mouth 3 (three) times daily with meals.    cetirizine (ZYRTEC) 10 MG tablet Take 10 mg by mouth as needed for allergies.    lidocaine-prilocaine (EMLA) cream Apply topically once.   Associated Diagnoses: Type II diabetes mellitus, uncontrolled    Multiple Vitamins-Minerals (DECUBI-VITE) CAPS Take 2 capsules by mouth daily.    pravastatin (PRAVACHOL) 20 MG tablet Take 20 mg by mouth daily.      STOP taking these medications     carvedilol (COREG) 6.25 MG tablet      loratadine (CLARITIN) 10 MG tablet      Wound Dressings (HYDROGEL) GEL        No Known Allergies Follow-up Information    Follow up with [facility], [place]. Schedule an appointment as soon as possible for a visit in 10 days.   Specialty:  Endocrinology   Contact information:   [address] [place] [address] [phone]       The results of significant diagnostics from  this hospitalization (including imaging, microbiology, ancillary and laboratory) are listed below for reference.    Significant Diagnostic Studies: Dg Esophagus  06/16/2014   CLINICAL DATA:  Difficulty swallowing, achalasia, recent esophageal dilatation with hematemesis  EXAM: ESOPHOGRAM/BARIUM SWALLOW  TECHNIQUE: Single contrast examination was performed using  thin barium.  FLUOROSCOPY TIME:  Radiation Exposure Index (as provided by the fluoroscopic device): Dose area product 99.19  COMPARISON:  None.  FINDINGS: Scout radiograph demonstrates a right IJ dual lumen dialysis catheter.  Long segment narrowing/stricture of the distal esophagus.  Dilated  proximal/mid esophagus with associated esophageal dysmotility, suggesting achalasia.  A 13 mm barium tablet was not administered.  Nondistended stomach is unremarkable.  Contrast did not empty into the small bowel during the course of the evaluation.  IMPRESSION: Long segment narrowing/stricture of the distal esophagus.  Associated dilatation of the proximal/mid esophagus with esophageal dysmotility, suggesting achalasia.   Electronically Signed   By: Charline Bills M.D.   On: 06/16/2014 14:31   Dg Chest Port 1 View  06/25/2014   CLINICAL DATA:  69 year old female with cough and hypoglycemia. Initial encounter.  EXAM: PORTABLE CHEST - 1 VIEW  COMPARISON:  Chest radiographs 06/12/2014 and earlier.  FINDINGS: Portable AP semi upright view at 1604 hours. Interval regressed bilateral pleural effusions and improved bibasilar ventilation. Stable cardiomegaly and mediastinal contours. Stable tunneled right chest dual lumen dialysis type catheter. No pneumothorax. No acute pulmonary edema. No consolidation.  IMPRESSION: Interval regressed pleural effusions and improved bibasilar ventilation. No new cardiopulmonary abnormality.   Electronically Signed   By: Odessa Fleming M.D.   On: 06/25/2014 16:10   Dg Abd Acute W/chest  06/12/2014   CLINICAL DATA:  Acute nausea, vomiting  and diarrhea.  EXAM: DG ABDOMEN ACUTE W/ 1V CHEST  COMPARISON:  May 09, 2014.  FINDINGS: There is no evidence of dilated bowel loops or free intraperitoneal air. No radiopaque calculi or other significant radiographic abnormality is seen. Stable cardiomediastinal silhouette. Right internal jugular dialysis catheter is unchanged in position. Moderate right pleural effusion is noted which is increased compared to prior exam. Mild diffuse interstitial densities are noted consistent with pulmonary edema.  IMPRESSION: No evidence of bowel obstruction or ileus. Mild bilateral pulmonary edema is noted with moderate right pleural effusion.   Electronically Signed   By: Lupita Raider, M.D.   On: 06/12/2014 13:55    Microbiology: Recent Results (from the past 240 hour(s))  Blood culture (routine x 2)     Status: None   Collection Time: 06/25/14  1:36 PM  Result Value Ref Range Status   Specimen Description BLOOD ARM RIGHT  Final   Special Requests BOTTLES DRAWN AEROBIC ONLY 5CC  Final   Culture   Final    STAPHYLOCOCCUS SPECIES (COAGULASE NEGATIVE) Note: THE SIGNIFICANCE OF ISOLATING THIS ORGANISM FROM A SINGLE SET OF BLOOD CULTURES WHEN MULTIPLE SETS ARE DRAWN IS UNCERTAIN. PLEASE NOTIFY THE MICROBIOLOGY DEPARTMENT WITHIN ONE WEEK IF SPECIATION AND SENSITIVITIES ARE REQUIRED. Note: Gram Stain Report Called to,Read Back By and Verified With: CYBILL ECKELMANN ON 5.5.16 AT 6:34P BY WILEJ Performed at [facility]    Report Status 06/27/2014 FINAL  Final  Blood culture (routine x 2)     Status: None (Preliminary result)   Collection Time: 06/25/14  1:48 PM  Result Value Ref Range Status   Specimen Description BLOOD RIGHT FOREARM  Final   Special Requests BOTTLES DRAWN AEROBIC AND ANAEROBIC 3CC  Final   Culture   Final           BLOOD CULTURE RECEIVED NO GROWTH TO DATE CULTURE WILL BE HELD FOR 5 DAYS BEFORE ISSUING A FINAL NEGATIVE REPORT Performed at [facility]    Report Status  PENDING  Incomplete  Urine culture     Status: None   Collection Time: 06/25/14  2:36 PM  Result Value Ref Range Status   Specimen Description URINE, CATHETERIZED  Final   Special Requests NONE  Final   Colony Count   Final    >=100,000 COLONIES/ML Performed at  [facility] [employer]   Final    Multiple bacterial morphotypes present, none predominant. Suggest appropriate recollection if clinically indicated. Performed at [facility]    Report Status 06/27/2014 FINAL  Final     Labs: Basic Metabolic Panel:  Recent Labs Lab 06/25/14 1348 06/26/14 0258 06/27/14 1044 06/28/14 0811  NA 135 134* 134* 134*  K 5.1 5.1 5.2* 5.5*  CL 95* 98* 100* 97*  CO2 GLUCOSE 170* 180* 233* 86  BUN 40* 47* 30* 52*  CREATININE 3.64* 3.84* 2.89* 3.74*  CALCIUM 9.3 8.1* 8.0* 8.3*  PHOS  --   --  3.8 5.4*   Liver Function Tests:  Recent Labs Lab 06/25/14 1348 06/26/14 0258 06/27/14 1044 06/28/14 0811  AST 51* 23  --   --   ALT 36 25  --   --   ALKPHOS 141* 105  --   --   BILITOT 1.0 0.3  --   --   PROT 8.7* 5.9*  --   --   ALBUMIN 3.4* 2.2* 2.1* 2.2*   CBC:  Recent Labs Lab 06/25/14 1348 06/26/14 0258 06/27/14 1044 06/28/14 0811  WBC 7.2 9.8 7.1 5.9  NEUTROABS 6.4  --   --   --   HGB 16.1* 12.1 11.9* 11.8*  HCT 49.9* 37.6 37.4 37.7  MCV 83.4 82.3 83.3 84.2  PLT 86* 83* 84* 103*   CBG:  Recent Labs Lab 06/27/14 0730 06/27/14 1122 06/27/14 1622 06/27/14 2109 06/28/14 1158  GLUCAP 148* 242* 254* 128* 88   Signed:  Vassie Loll  [facility] 06/28/2014, 12:34 PM

## 2014-06-28 NOTE — Clinical Social Work Placement (Signed)
   CLINICAL SOCIAL WORK PLACEMENT  NOTE  Date:  06/28/2014  Patient Details  Name: Elizabeth [name] MRN: [id number] Date of Birth: [birth year]  Clinical Social Work is seeking post-discharge placement for this patient at the Skilled  Nursing Facility level of care (*CSW will initial, date and re-position this form in  chart as items are completed):  Yes   Patient/family provided with [place] Clinical Social Work Department's list of facilities offering this level of care within the geographic area requested by the patient (or if unable, by the patient's family).  Yes   Patient/family informed of their freedom to choose among providers that offer the needed level of care, that participate in Medicare, Medicaid or managed care program needed by the patient, have an available bed and are willing to accept the patient.  Yes   Patient/family informed of [facility]'s ownership interest in [facility] and [facility], as well as of the fact that they are under no obligation to receive care at these facilities.  PASRR submitted to EDS on       PASRR number received on       Existing PASRR number confirmed on 06/26/14     FL2 transmitted to all facilities in geographic area requested by pt/family on 06/26/14     FL2 transmitted to all facilities within larger geographic area on       Patient informed that his/her managed care company has contracts with or will negotiate with certain facilities, including the following:        Yes   Patient/family informed of bed offers received.  Patient chooses bed at [facility]     Physician recommends and patient chooses bed at [facility]    Patient to be transferred to   on  .  Patient to be transferred to facility by       Patient family notified on   of transfer.  Name of family member notified:        PHYSICIAN Please sign FL2     Additional Comment:     _______________________________________________ Liliana Cline, LCSW 06/28/2014, 1:36 PM

## 2014-06-28 NOTE — Progress Notes (Signed)
Patient for d/c today to SNF bed at [facility]. Family [facility]) and patient agreeable to this plan- plan transfer via EMS. Reece Levy, MSW, LCSWA [phone]/weekend coverage

## 2014-06-30 DIAGNOSIS — E1129 Type 2 diabetes mellitus with other diabetic kidney complication: Secondary | ICD-10-CM | POA: Insufficient documentation

## 2014-06-30 NOTE — Progress Notes (Signed)
Patient ID: Elizabeth [name], female   DOB: [birth year], 69 y.o.   MRN: [id number]  [name] living [place]     No Known Allergies     Chief Complaint  Patient presents with  . Discharge Note    HPI:  She is being discharged to home after begin hospitalized for left bka; and GIB. She will need home health for pt/ot/rn/cna for balance; gait; endurance; adl training; adl care; diabetes teaching wound care and medication management. She will not need dme. She will need her prescriptions written and follow up with her pcp    Past Medical History  Diagnosis Date  . Hyperlipidemia   . Urinary retention     DX NEUROGENIC BLADDER--HAS INDWELLING FOLEY CATHETER  . Anemia   . CAD (coronary artery disease)   . Neuropathic pain   . Foley catheter in place     for urinary retention  . Critical lower limb ischemia   . Hypertension   . Peripheral vascular disease   . Hypoglycemia 08/19/2013  . Hypokalemia 08/19/2013  . IDDM (insulin dependent diabetes mellitus)   . Hematemesis 06/12/2014 hospitalized  . Stroke     "minor one"  . Arthritis     "right knee" (06/12/2014)  . Gout     "was in my LLE"  . ESRD (end stage renal disease)     [address], [place], [place] (06/12/2014)    Past Surgical History  Procedure Laterality Date  . Insertion of suprapubic catheter N/A 05/16/2012    Procedure: INSERTION OF SUPRAPUBIC CATHETER;  Surgeon: Sebastian Ache, MD;  Location: [facility] ORS;  Service: Urology;  Laterality: N/A;  . Bascilic vein transposition Left 10/30/2012    Procedure: LEFT 1ST STAGE BASCILIC VEIN TRANSPOSITION;  Surgeon: Chuck Hint, MD;  Location: [facility] OR;  Service: Vascular;  Laterality: Left;  . Nm myocar perf wall motion  04/19/2012    low risk study-EF 44%,mild inferolateral hypkinesis  . Bascilic vein transposition Left 02/05/2013    Procedure: BASCILIC VEIN TRANSPOSITION 2ND STAGE;  Surgeon: Chuck Hint, MD;  Location: [facility] OR;  Service: Vascular;  Laterality: Left;    . Radiology with anesthesia N/A 10/25/2013    Procedure: RADIOLOGY WITH ANESTHESIA;  Surgeon: Medication Radiologist, MD;  Location: [facility] OR;  Service: Radiology;  Laterality: N/A;  . Fistulogram Left 04/29/2013    Procedure: FISTULOGRAM;  Surgeon: Chuck Hint, MD;  Location: [facility] CATH LAB;  Service: Cardiovascular;  Laterality: Left;  . Angioplasty Left 04/29/2013    Procedure: ANGIOPLASTY;  Surgeon: Chuck Hint, MD;  Location: [facility] CATH LAB;  Service: Cardiovascular;  Laterality: Left;  . Abdominal aortagram N/A 03/19/2014    Procedure: ABDOMINAL Ronny Flurry;  Surgeon: Sherren Kerns, MD;  Location: [facility] CATH LAB;  Service: Cardiovascular;  Laterality: N/A;  . Left and right heart catheterization with coronary angiogram N/A 03/24/2014    Procedure: LEFT AND RIGHT HEART CATHETERIZATION WITH CORONARY ANGIOGRAM;  Surgeon: Marykay Lex, MD;  Location: [facility] CATH LAB;  Service: Cardiovascular;  Laterality: N/A;  . Endarterectomy popliteal Left 03/26/2014    Procedure: Left popliteal and peroneal artery endarterectomy with vein patch angioplasty;  Surgeon: Sherren Kerns, MD;  Location: [facility] OR;  Service: Vascular;  Laterality: Left;  . Amputation Left 03/26/2014    Procedure: AMPUTATION SECOND LEFT TOE;  Surgeon: Sherren Kerns, MD;  Location: [facility] OR;  Service: Vascular;  Laterality: Left;  . Av fistula placement Left 04/28/2014    Procedure: CREATION OF LEFT BRACHIOCEPHALIC  ARTERIOVENOUS (  AV) FISTULA CREATION;  Surgeon: Fransisco Hertz, MD;  Location: [facility] OR;  Service: Vascular;  Laterality: Left;  . Amputation Left 05/10/2014    Procedure: AMPUTATION BELOW KNEE;  Surgeon: Larina Earthly, MD;  Location: [facility] OR;  Service: Vascular;  Laterality: Left;  . Esophagogastroduodenoscopy N/A 06/13/2014    Procedure: ESOPHAGOGASTRODUODENOSCOPY (EGD);  Surgeon: Jeani Hawking, MD;  Location: [facility] ENDOSCOPY;  Service: Endoscopy;  Laterality: N/A;    VITAL SIGNS BP 117/70 mmHg  Pulse 100  Ht 5\' 3"  (1.6 m)  Wt 81 lb  (36.741 kg)  BMI 14.35 kg/m2   Outpatient Encounter Prescriptions as of 06/20/2014  Medication Sig   . Amino Acids-Protein Hydrolys (FEEDING SUPPLEMENT, PRO-STAT SUGAR FREE 64,) LIQD Take 30 mLs by mouth 3 (three) times daily with meals.   Coreg 6.25 mg  6.25 mg twice daily    Colace 100 mg  Take 1 daily    novolog  5 units ac meals for cbg >=150   lantus  10 units nightly    Percocet 5/325 mg  1 every 8 hours as needed    claritin 10 mg  10 mg daily as needed   . Darbepoetin Alfa (ARANESP) 200 MCG/0.4ML SOSY injection Inject 0.4 mLs (200 mcg total) into the vein every Thursday with hemodialysis.  Marland Kitchen doxercalciferol (HECTOROL) 4 MCG/2ML injection Inject 0.5 mLs (1 mcg total) into the vein Every Tuesday,Thursday,and Saturday with dialysis.  . Multiple Vitamins-Minerals (DECUBI-VITE) CAPS Take 2 capsules by mouth daily.  . pantoprazole (PROTONIX) 40 MG tablet Take 1 tablet (40 mg total) by mouth daily.  . pravastatin (PRAVACHOL) 20 MG tablet Take 20 mg by mouth daily.  Marland Kitchen saccharomyces boulardii (FLORASTOR) 250 MG capsule Take 1 capsule (250 mg total) by mouth 2 (two) times daily.  . sevelamer carbonate (RENVELA) 800 MG tablet Take 1 tablet (800 mg total) by mouth 3 (three) times daily with meals.      SIGNIFICANT DIAGNOSTIC EXAMS  05-04-14: chest x-ray: mild chf with bilateral air space disease and small bilateral pleural effusion   05-09-14: chest x-ray: 1. Stable mild cardiomegaly. 2. Decreased pleural effusions and improved aeration.  05-09-14: left foot x-ray: Acute osteomyelitis involving in the residual 2nd metatarsal, 3rd metatarsal head, and 3rd proximal phalanx.Associated pathologic fracture of the 3rd proximal phalanx.     LABS REVIEWED:   04-01-14: wbc 12.1; hgb 9.6; hct 34.7; mcv 92.4; plt 434 05-02-14: urine culture: neg  05-09-14: wbc 8.5; hgb 11.;4 hct 37.1; mcv 87.1 plt 252; glucose 299; bun 43; creat 3.49; k+ 3.7; na++135; liver normal albumin 2.1; hgb a1c 7.0; tsh  1.242 05-11-14: wbc 9.7; hgb 10.8; hct 37.1; mcv 90.3; plt 213; glucose 114; bun 24; creat 3.12; k+4.5; na++136  05-12-14: wbc 11.9; hgb 10.4; hct 33.8; mcv 87.3 plt 225; glucose 151; bun 52; creat 4.52; k+5.0; na++135        ROS Constitutional: Negative for malaise/fatigue.  Respiratory: Negative for cough and shortness of breath.   Cardiovascular: Negative for chest pain.  Gastrointestinal: Negative for heartburn and abdominal pain.  Musculoskeletal: Negative for myalgias and joint pain.  Skin:       Is status post left bka  Psychiatric/Behavioral: The patient is not nervous/anxious.       Physical Exam Constitutional: No distress.  Frail   Neck: Neck supple. No JVD present.  Cardiovascular: Normal rate, regular rhythm and intact distal pulses.   Murmur heard. Respiratory: Effort normal and breath sounds normal. No respiratory distress.  GI: Soft. Bowel  sounds are normal. She exhibits no distension. There is no tenderness.  Has foley  Musculoskeletal: She exhibits no edema.  Is able to move extremities   Neurological: She is alert.  Skin: Skin is warm and dry. She is not diaphoretic.  Left arm A/V fistula: +thril +bruitright upper chest dialysis access  Status post left bka; no signs of infection present.         ASSESSMENT/ PLAN:  Will discharge to home with home health for rn/ot/rn/cna for balance; gait; endurance; adl retraining; diabetes teaching and medication management and adl care; wound care. She will not need any dme. Her prescriptions have written for a 30 days supply of medications with #30 percocet 5/325 mg tabs. Has follow up with Dr. [name] on 06-23-14 at 9:30 am  Time spent with patient 40 minutes.    Synthia Innocent NP [facility] Adult Medicine  Contact [phone] Monday through Friday 8am- 5pm  After hours call [phone]

## 2014-06-30 NOTE — Progress Notes (Signed)
Patient ID: Elizabeth [name], female   DOB: [birth year], 69 y.o.   MRN: [id number]  Elizabeth [name] living [place]     No Known Allergies     Chief Complaint  Patient presents with  . Hospitalization Follow-up    HPI:  She has been hospitalized due to left foot osteomyelitis and under went a left bka. She is here for short term rehab with her goal to return home at this time. She denies any pain stating that she is feeling better.    Past Medical History  Diagnosis Date  . Hyperlipidemia   . Urinary retention     DX NEUROGENIC BLADDER--HAS INDWELLING FOLEY CATHETER  . Anemia   . CAD (coronary artery disease)   . Neuropathic pain   . Foley catheter in place     for urinary retention  . Critical lower limb ischemia   . Hypertension   . Peripheral vascular disease   . Hypoglycemia 08/19/2013  . Hypokalemia 08/19/2013  . IDDM (insulin dependent diabetes mellitus)   . Stroke     "minor one"  . Arthritis     "right knee" (06/12/2014)  . Gout     "was in my LLE"  . ESRD (end stage renal disease)     [address], [place], [place] (06/12/2014)    Past Surgical History  Procedure Laterality Date  . Insertion of suprapubic catheter N/A 05/16/2012    Procedure: INSERTION OF SUPRAPUBIC CATHETER;  Surgeon: Sebastian Ache, MD;  Location: [facility] ORS;  Service: Urology;  Laterality: N/A;  . Bascilic vein transposition Left 10/30/2012    Procedure: LEFT 1ST STAGE BASCILIC VEIN TRANSPOSITION;  Surgeon: Chuck Hint, MD;  Location: [facility] OR;  Service: Vascular;  Laterality: Left;  . Nm myocar perf wall motion  04/19/2012    low risk study-EF 44%,mild inferolateral hypkinesis  . Bascilic vein transposition Left 02/05/2013    Procedure: BASCILIC VEIN TRANSPOSITION 2ND STAGE;  Surgeon: Chuck Hint, MD;  Location: [facility] OR;  Service: Vascular;  Laterality: Left;  . Radiology with anesthesia N/A 10/25/2013    Procedure: RADIOLOGY WITH ANESTHESIA;  Surgeon: Medication Radiologist, MD;  Location: [facility]  OR;  Service: Radiology;  Laterality: N/A;  . Fistulogram Left 04/29/2013    Procedure: FISTULOGRAM;  Surgeon: Chuck Hint, MD;  Location: [facility] CATH LAB;  Service: Cardiovascular;  Laterality: Left;  . Angioplasty Left 04/29/2013    Procedure: ANGIOPLASTY;  Surgeon: Chuck Hint, MD;  Location: [facility] CATH LAB;  Service: Cardiovascular;  Laterality: Left;  . Abdominal aortagram N/A 03/19/2014    Procedure: ABDOMINAL Ronny Flurry;  Surgeon: Sherren Kerns, MD;  Location: [facility] CATH LAB;  Service: Cardiovascular;  Laterality: N/A;  . Left and right heart catheterization with coronary angiogram N/A 03/24/2014    Procedure: LEFT AND RIGHT HEART CATHETERIZATION WITH CORONARY ANGIOGRAM;  Surgeon: Marykay Lex, MD;  Location: [facility] CATH LAB;  Service: Cardiovascular;  Laterality: N/A;  . Endarterectomy popliteal Left 03/26/2014    Procedure: Left popliteal and peroneal artery endarterectomy with vein patch angioplasty;  Surgeon: Sherren Kerns, MD;  Location: [facility] OR;  Service: Vascular;  Laterality: Left;  . Amputation Left 03/26/2014    Procedure: AMPUTATION SECOND LEFT TOE;  Surgeon: Sherren Kerns, MD;  Location: [facility] OR;  Service: Vascular;  Laterality: Left;  . Av fistula placement Left 04/28/2014    Procedure: CREATION OF LEFT BRACHIOCEPHALIC  ARTERIOVENOUS (AV) FISTULA CREATION;  Surgeon: Fransisco Hertz, MD;  Location: [facility] OR;  Service: Vascular;  Laterality:  Left;  . Amputation Left 05/10/2014    Procedure: AMPUTATION BELOW KNEE;  Surgeon: Larina Earthly, MD;  Location: [facility] OR;  Service: Vascular;  Laterality: Left;    VITAL SIGNS BP 105/60 mmHg  Pulse 98  Ht  (1.6 m)  Wt 77 lb (34.927 kg)  BMI 13.64 kg/m2  SpO2 95%   Outpatient Encounter Prescriptions as of 05/20/2014  Medication Sig  . Amino Acids-Protein Hydrolys (FEEDING SUPPLEMENT, PRO-STAT SUGAR FREE 64,) LIQD Take 30 mLs by mouth 3 (three) times daily with meals.   Tylenol #3 1 every 6 hours as needed    Coreg 6.25 mg  6.25 mg twice  daily    Colace 100 mg  Take 1 daily    novolog  5 units ac meals for cbg >=150   lantus  10 units nightly    Percocet 5/325 mg  1 every 8 hours as needed    claritin 10 mg  10 mg daily as needed   . Darbepoetin Alfa (ARANESP) 200 MCG/0.4ML SOSY injection Inject 0.4 mLs (200 mcg total) into the vein every Thursday with hemodialysis.  Marland Kitchen doxercalciferol (HECTOROL) 4 MCG/2ML injection Inject 0.5 mLs (1 mcg total) into the vein Every Tuesday,Thursday,and Saturday with dialysis.  . Multiple Vitamins-Minerals (DECUBI-VITE) CAPS Take 2 capsules by mouth daily.  . pantoprazole (PROTONIX) 40 MG tablet Take 1 tablet (40 mg total) by mouth daily.  . pravastatin (PRAVACHOL) 20 MG tablet Take 20 mg by mouth daily.  Marland Kitchen saccharomyces boulardii (FLORASTOR) 250 MG capsule Take 1 capsule (250 mg total) by mouth 2 (two) times daily.  . sevelamer carbonate (RENVELA) 800 MG tablet Take 1 tablet (800 mg total) by mouth 3 (three) times daily with meals.      SIGNIFICANT DIAGNOSTIC EXAMS  05-04-14: chest x-ray: mild chf with bilateral air space disease and small bilateral pleural effusion   05-09-14: chest x-ray: 1. Stable mild cardiomegaly. 2. Decreased pleural effusions and improved aeration.  05-09-14: left foot x-ray: Acute osteomyelitis involving in the residual 2nd metatarsal, 3rd metatarsal head, and 3rd proximal phalanx.Associated pathologic fracture of the 3rd proximal phalanx.     LABS REVIEWED:   04-01-14: wbc 12.1; hgb 9.6; hct 34.7; mcv 92.4; plt 434 05-02-14: urine culture: neg  05-09-14: wbc 8.5; hgb 11.;4 hct 37.1; mcv 87.1 plt 252; glucose 299; bun 43; creat 3.49; k+ 3.7; na++135; liver normal albumin 2.1; hgb a1c 7.0; tsh 1.242 05-11-14: wbc 9.7; hgb 10.8; hct 37.1; mcv 90.3; plt 213; glucose 114; bun 24; creat 3.12; k+4.5; na++136  05-12-14: wbc 11.9; hgb 10.4; hct 33.8; mcv 87.3 plt 225; glucose 151; bun 52; creat 4.52; k+5.0; na++135       ROS Constitutional: Negative for malaise/fatigue.    Respiratory: Negative for cough and shortness of breath.   Cardiovascular: Negative for chest pain.  Gastrointestinal: Negative for heartburn and abdominal pain.  Musculoskeletal: Negative for myalgias and joint pain.  Skin:       Is status post left bka  Psychiatric/Behavioral: The patient is not nervous/anxious.       Physical Exam Constitutional: No distress.  Frail   Neck: Neck supple. No JVD present.  Cardiovascular: Normal rate, regular rhythm and intact distal pulses.   Murmur heard. Respiratory: Effort normal and breath sounds normal. No respiratory distress.  GI: Soft. Bowel sounds are normal. She exhibits no distension. There is no tenderness.  Has foley  Musculoskeletal: She exhibits no edema.  Is able to move extremities   Neurological: She is  alert.  Skin: Skin is warm and dry. She is not diaphoretic.  Left arm A/V fistula: +thril +bruitright upper chest dialysis access  Status post left bka; no signs of infection present.          ASSESSMENT/ PLAN:  1.  ESRD: is followed by nephrology; is on hemodialysis three days per week; is on sevelamer 800 mg with meals; has 1200 cc fluid restriction  2. Critical lower extremity ischemia: is status post left bka; has tylenol #3 every 6 hours as needed for pain and percocet 5.325 mg every 8 hours as needed  Will continue therapy as directed.   3. Chronic systolic heart failure: will continue coreg 6.25 mg twice daily asa 81 mg daily will monitor  4. Diabetes: will continue lantus 10 units nightly and novolog 5 units prior to meals for cbg >=150her hgb a1c is 7.0   5. Dyslipidemia: will continue pravachol 20 mg daily   6. Gerd: will continue protonix 40 mg daily   7. Protein calorie malnutrition: will continue prostat 30 cc three times daily her albumin is 2.1   Time spent with patient 50 minutes.     Synthia Innocent NP [facility] Adult Medicine  Contact [phone] Monday through Friday 8am- 5pm  After hours call  [phone]

## 2014-07-01 LAB — CULTURE, BLOOD (ROUTINE X 2): Culture: NO GROWTH

## 2014-07-05 NOTE — ED Provider Notes (Signed)
CSN: [id number]     Arrival date & time 06/25/14  1208 History   First MD Initiated Contact with Patient 06/25/14 1223     Chief Complaint  Patient presents with  . Hypoglycemia     (Consider location/radiation/quality/duration/timing/severity/associated sxs/prior Treatment) HPI Comments: Pt comes in with cc of altered mental status. 69 y.o. 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  She was found this morning by her home health nurse and was brought into the hospital for further evaluation. Pt was found slumped by her wheel chair. She was found to be hypoglycemic by the EMS with blood sugars in the 30s. She was given D50. She was brought into the hospital. She was found to have a temperature of 71F. She denies any recent fever, chills. Denies any cough, nausea, vomiting, diarrhea, dysuria. Denies any joint pains. Her left stump is healing well. Pt denies taking too much insulin, or poor po intake.   ROS 10 Systems reviewed and are negative for acute change except as noted in the HPI.     The history is provided by the patient.    Past Medical History  Diagnosis Date  . Hyperlipidemia   . Urinary retention     DX NEUROGENIC BLADDER--HAS INDWELLING FOLEY CATHETER  . Anemia   . CAD (coronary artery disease)   . Neuropathic pain   . Foley catheter in place     for urinary retention  . Critical lower limb ischemia   . Hypertension   . Peripheral vascular disease   . Hypoglycemia 08/19/2013  . Hypokalemia 08/19/2013  . IDDM (insulin dependent diabetes mellitus)   . Hematemesis 06/12/2014 hospitalized  . Stroke     "minor one"  . Arthritis     "right knee" (06/12/2014)  . Gout     "was in my LLE"  . ESRD (end stage renal disease)     [address], [place], [place] (06/12/2014)   Past Surgical History  Procedure Laterality Date  .  Insertion of suprapubic catheter N/A 05/16/2012    Procedure: INSERTION OF SUPRAPUBIC CATHETER;  Surgeon: Sebastian Ache, MD;  Location: [facility] ORS;  Service: Urology;  Laterality: N/A;  . Bascilic vein transposition Left 10/30/2012    Procedure: LEFT 1ST STAGE BASCILIC VEIN TRANSPOSITION;  Surgeon: Chuck Hint, MD;  Location: [facility] OR;  Service: Vascular;  Laterality: Left;  . Nm myocar perf wall motion  04/19/2012    low risk study-EF 44%,mild inferolateral hypkinesis  . Bascilic vein transposition Left 02/05/2013    Procedure: BASCILIC VEIN TRANSPOSITION 2ND STAGE;  Surgeon: Chuck Hint, MD;  Location: [facility] OR;  Service: Vascular;  Laterality: Left;  . Radiology with anesthesia N/A 10/25/2013    Procedure: RADIOLOGY WITH ANESTHESIA;  Surgeon: Medication Radiologist, MD;  Location: [facility] OR;  Service: Radiology;  Laterality: N/A;  . Fistulogram Left 04/29/2013    Procedure: FISTULOGRAM;  Surgeon: Chuck Hint, MD;  Location: [facility] CATH LAB;  Service: Cardiovascular;  Laterality: Left;  . Angioplasty Left 04/29/2013    Procedure: ANGIOPLASTY;  Surgeon: Chuck Hint, MD;  Location: [facility] CATH LAB;  Service: Cardiovascular;  Laterality: Left;  . Abdominal aortagram N/A 03/19/2014    Procedure: ABDOMINAL Ronny Flurry;  Surgeon: Sherren Kerns, MD;  Location: [facility] CATH LAB;  Service: Cardiovascular;  Laterality: N/A;  . Left and right heart catheterization with coronary angiogram N/A 03/24/2014  Procedure: LEFT AND RIGHT HEART CATHETERIZATION WITH CORONARY ANGIOGRAM;  Surgeon: Marykay Lex, MD;  Location: [facility] CATH LAB;  Service: Cardiovascular;  Laterality: N/A;  . Endarterectomy popliteal Left 03/26/2014    Procedure: Left popliteal and peroneal artery endarterectomy with vein patch angioplasty;  Surgeon: Sherren Kerns, MD;  Location: [facility] OR;  Service: Vascular;  Laterality: Left;  . Amputation Left 03/26/2014    Procedure: AMPUTATION SECOND LEFT TOE;  Surgeon: Sherren Kerns, MD;   Location: [facility] OR;  Service: Vascular;  Laterality: Left;  . Av fistula placement Left 04/28/2014    Procedure: CREATION OF LEFT BRACHIOCEPHALIC  ARTERIOVENOUS (AV) FISTULA CREATION;  Surgeon: Fransisco Hertz, MD;  Location: [facility] OR;  Service: Vascular;  Laterality: Left;  . Amputation Left 05/10/2014    Procedure: AMPUTATION BELOW KNEE;  Surgeon: Larina Earthly, MD;  Location: [facility] OR;  Service: Vascular;  Laterality: Left;  . Esophagogastroduodenoscopy N/A 06/13/2014    Procedure: ESOPHAGOGASTRODUODENOSCOPY (EGD);  Surgeon: Jeani Hawking, MD;  Location: [facility] ENDOSCOPY;  Service: Endoscopy;  Laterality: N/A;   Family History  Problem Relation Age of Onset  . Hyperlipidemia Mother   . Hypertension Mother   . Hodgkin's lymphoma Father   . Heart disease Sister    History  Substance Use Topics  . Smoking status: Never Smoker   . Smokeless tobacco: Never Used  . Alcohol Use: 0.0 oz/week    0 Standard drinks or equivalent per week     Comment: "stopped drinking in 2013 drank a couple beers/day, 2 days/wk"   OB History    No data available     Review of Systems  All other systems reviewed and are negative.     Allergies  Review of patient's allergies indicates no known allergies.  Home Medications   Prior to Admission medications   Medication Sig Start Date End Date Taking? Authorizing Provider  aspirin 81 MG tablet Take 81 mg by mouth daily.   Yes Historical Provider, MD  Darbepoetin Alfa (ARANESP) 200 MCG/0.4ML SOSY injection Inject 0.4 mLs (200 mcg total) into the vein every Thursday with hemodialysis. 05/15/14  Yes Joseph Art, DO  doxercalciferol (HECTOROL) 4 MCG/2ML injection Inject 0.5 mLs (1 mcg total) into the vein Every Tuesday,Thursday,and Saturday with dialysis. 05/13/14  Yes Joseph Art, DO  glucose blood (ONETOUCH VERIO) test strip Use as instructed to check blood sugar 4 times per day dx code E13.10 02/17/14  Yes [name], MD  insulin aspart (NOVOLOG) 100 UNIT/ML injection  Inject 0-9 Units into the skin 3 (three) times daily with meals. Sliding scale CBG 70 - 120: 0 units CBG 121 - 150: 1 unit,  CBG 151 - 200: 2 units,  CBG 201 - 250: 3 units,  CBG 251 - 300: 5 units,  CBG 301 - 350: 7 units,  CBG 351 - 400: 9 units   CBG > 400: 9 units and notify your MD 06/16/14  Yes Ripudeep Jenna Luo, MD  [facility] DELICA LANCETS FINE MISC Use to check blood sugar 4 times per day 09/16/13  Yes [name], MD  pantoprazole (PROTONIX) 40 MG tablet Take 1 tablet (40 mg total) by mouth daily. 05/13/14  Yes Joseph Art, DO  saccharomyces boulardii (FLORASTOR) 250 MG capsule Take 1 capsule (250 mg total) by mouth 2 (two) times daily. 03/28/14  Yes Starleen Arms, MD  sevelamer carbonate (RENVELA) 800 MG tablet Take 1 tablet (800 mg total) by mouth 3 (three) times daily with meals.  12/28/13  Yes Ripudeep Jenna Luo, MD  Amino Acids-Protein Hydrolys (FEEDING SUPPLEMENT, PRO-STAT SUGAR FREE 64,) LIQD Take 30 mLs by mouth 3 (three) times daily with meals.    Historical Provider, MD  cetirizine (ZYRTEC) 10 MG tablet Take 10 mg by mouth as needed for allergies.    Historical Provider, MD  docusate sodium (COLACE) 50 MG capsule Take 1 capsule (50 mg total) by mouth 2 (two) times daily. Hold for diarrhea 06/28/14   Vassie Loll, MD  insulin glargine (LANTUS) 100 UNIT/ML injection Inject 0.03 mLs (3 Units total) into the skin daily. 06/29/14   Vassie Loll, MD  lidocaine-prilocaine (EMLA) cream Apply topically once.    Historical Provider, MD  Multiple Vitamins-Minerals (DECUBI-VITE) CAPS Take 2 capsules by mouth daily.    Historical Provider, MD  Nutritional Supplements (FEEDING SUPPLEMENT, NEPRO CARB STEADY,) LIQD Take 237 mLs by mouth 2 (two) times daily between meals. 06/28/14   Vassie Loll, MD  oxyCODONE-acetaminophen (PERCOCET/ROXICET) 5-325 MG per tablet Take 1 tablet by mouth every 8 (eight) hours as needed for moderate pain or severe pain. 06/28/14   Vassie Loll, MD  pravastatin (PRAVACHOL) 20 MG  tablet Take 20 mg by mouth daily.    Historical Provider, MD   BP 110/60 mmHg  Pulse 90  Temp(Src) 97.9 F (36.6 C) (Oral)  Resp 18  Ht  (1.6 m)  Wt 82 lb 10.8 oz (37.5 kg)  BMI 14.65 kg/m2  SpO2 100% Physical Exam  Constitutional: She is oriented to person, place, and time. She appears well-developed and well-nourished.  HENT:  Head: Normocephalic and atraumatic.  Eyes: EOM are normal. Pupils are equal, round, and reactive to light.  Neck: Neck supple.  Cardiovascular: Normal rate, regular rhythm and normal heart sounds.   No murmur heard. Pulmonary/Chest: Effort normal. No respiratory distress.  Abdominal: Soft. She exhibits no distension. There is no tenderness. There is no rebound and no guarding.  Neurological: She is alert and oriented to person, place, and time.  Skin: Skin is warm and dry.  Nursing note and vitals reviewed.   ED Course  Procedures (including critical care time) Labs Review Labs Reviewed  CBC WITH DIFFERENTIAL/PLATELET - Abnormal; Notable for the following:    RBC 5.98 (*)    Hemoglobin 16.1 (*)    HCT 49.9 (*)    RDW 20.5 (*)    Platelets 86 (*)    Neutrophils Relative % 89 (*)    Lymphocytes Relative 8 (*)    Lymphs Abs 0.6 (*)    All other components within normal limits  COMPREHENSIVE METABOLIC PANEL - Abnormal; Notable for the following:    Chloride 95 (*)    Glucose, Bld 170 (*)    BUN 40 (*)    Creatinine, Ser 3.64 (*)    Total Protein 8.7 (*)    Albumin 3.4 (*)    AST 51 (*)    Alkaline Phosphatase 141 (*)    GFR calc non Af Amer 12 (*)    GFR calc Af Amer 14 (*)    Anion gap 17 (*)    All other components within normal limits  URINALYSIS, ROUTINE W REFLEX MICROSCOPIC - Abnormal; Notable for the following:    APPearance TURBID (*)    Glucose, UA 250 (*)    Hgb urine dipstick LARGE (*)    Protein, ur >300 (*)    Leukocytes, UA LARGE (*)    All other components within normal limits  URINE MICROSCOPIC-ADD ON - Abnormal;  Notable for the following:    Squamous Epithelial / LPF FEW (*)    Bacteria, UA MANY (*)    Casts GRANULAR CAST (*)    All other components within normal limits  HEMOGLOBIN A1C - Abnormal; Notable for the following:    Hgb A1c MFr Bld 6.7 (*)    All other components within normal limits  LACTIC ACID, PLASMA - Abnormal; Notable for the following:    Lactic Acid, Venous 2.1 (*)    All other components within normal limits  LACTIC ACID, PLASMA - Abnormal; Notable for the following:    Lactic Acid, Venous 2.3 (*)    All other components within normal limits  COMPREHENSIVE METABOLIC PANEL - Abnormal; Notable for the following:    Sodium 134 (*)    Chloride 98 (*)    Glucose, Bld 180 (*)    BUN 47 (*)    Creatinine, Ser 3.84 (*)    Calcium 8.1 (*)    Total Protein 5.9 (*)    Albumin 2.2 (*)    GFR calc non Af Amer 11 (*)    GFR calc Af Amer 13 (*)    All other components within normal limits  CBC - Abnormal; Notable for the following:    RDW 20.0 (*)    Platelets 83 (*)    All other components within normal limits  GLUCOSE, CAPILLARY - Abnormal; Notable for the following:    Glucose-Capillary 177 (*)    All other components within normal limits  GLUCOSE, CAPILLARY - Abnormal; Notable for the following:    Glucose-Capillary 184 (*)    All other components within normal limits  GLUCOSE, CAPILLARY - Abnormal; Notable for the following:    Glucose-Capillary 219 (*)    All other components within normal limits  GLUCOSE, CAPILLARY - Abnormal; Notable for the following:    Glucose-Capillary 200 (*)    All other components within normal limits  GLUCOSE, CAPILLARY - Abnormal; Notable for the following:    Glucose-Capillary 153 (*)    All other components within normal limits  GLUCOSE, CAPILLARY - Abnormal; Notable for the following:    Glucose-Capillary 238 (*)    All other components within normal limits  GLUCOSE, CAPILLARY - Abnormal; Notable for the following:     Glucose-Capillary 240 (*)    All other components within normal limits  GLUCOSE, CAPILLARY - Abnormal; Notable for the following:    Glucose-Capillary 174 (*)    All other components within normal limits  GLUCOSE, CAPILLARY - Abnormal; Notable for the following:    Glucose-Capillary 148 (*)    All other components within normal limits  RENAL FUNCTION PANEL - Abnormal; Notable for the following:    Sodium 134 (*)    Potassium 5.2 (*)    Chloride 100 (*)    Glucose, Bld 233 (*)    BUN 30 (*)    Creatinine, Ser 2.89 (*)    Calcium 8.0 (*)    Albumin 2.1 (*)    GFR calc non Af Amer 15 (*)    GFR calc Af Amer 18 (*)    All other components within normal limits  CBC - Abnormal; Notable for the following:    Hemoglobin 11.9 (*)    RDW 19.8 (*)    Platelets 84 (*)    All other components within normal limits  GLUCOSE, CAPILLARY - Abnormal; Notable for the following:    Glucose-Capillary 242 (*)    All other components within normal limits  GLUCOSE, CAPILLARY - Abnormal; Notable for the following:    Glucose-Capillary 254 (*)    All other components within normal limits  GLUCOSE, CAPILLARY - Abnormal; Notable for the following:    Glucose-Capillary 128 (*)    All other components within normal limits  RENAL FUNCTION PANEL - Abnormal; Notable for the following:    Sodium 134 (*)    Potassium 5.5 (*)    Chloride 97 (*)    BUN 52 (*)    Creatinine, Ser 3.74 (*)    Calcium 8.3 (*)    Phosphorus 5.4 (*)    Albumin 2.2 (*)    GFR calc non Af Amer 11 (*)    GFR calc Af Amer 13 (*)    All other components within normal limits  CBC - Abnormal; Notable for the following:    Hemoglobin 11.8 (*)    RDW 19.9 (*)    Platelets 103 (*)    All other components within normal limits  GLUCOSE, CAPILLARY - Abnormal; Notable for the following:    Glucose-Capillary 132 (*)    All other components within normal limits  CBG MONITORING, ED - Abnormal; Notable for the following:     Glucose-Capillary 214 (*)    All other components within normal limits  CBG MONITORING, ED - Abnormal; Notable for the following:    Glucose-Capillary 200 (*)    All other components within normal limits  I-STAT CG4 LACTIC ACID, ED - Abnormal; Notable for the following:    Lactic Acid, Venous 3.64 (*)    All other components within normal limits  I-STAT CG4 LACTIC ACID, ED - Abnormal; Notable for the following:    Lactic Acid, Venous 2.17 (*)    All other components within normal limits  CULTURE, BLOOD (ROUTINE X 2)  CULTURE, BLOOD (ROUTINE X 2)  URINE CULTURE  PROCALCITONIN  PROTIME-INR  APTT  LACTIC ACID, PLASMA  GLUCOSE, CAPILLARY  VANCOMYCIN, TROUGH  GLUCOSE, CAPILLARY  I-STAT TROPOININ, ED  CBG MONITORING, ED    Imaging Review No results found.   EKG Interpretation   Date/Time:  Wednesday Jun 25 2014 12:19:44 EDT Ventricular Rate:  83 PR Interval:  149 QRS Duration: 91 QT Interval:  429 QTC Calculation: 504 R Axis:   -59 Text Interpretation:  Sinus rhythm Probable left atrial enlargement Left  anterior fascicular block Left ventricular hypertrophy Anterior Q waves,  possibly due to LVH No significant change since last tracing Confirmed by  Lulubelle Simcoe, MD, Janey Genta [id number]) on 06/25/2014 12:27:45 PM      MDM   Final diagnoses:  ESRD on dialysis  SIRS (systemic inflammatory response syndrome)  Hypothermia, initial encounter    Pt on dialysis comes in with AMS. She had hypoglycemia, and hypothermia. No clear source of infection on exam. Doubt adrenal insufficiency. Will admit for the hypothermia.   Derwood Kaplan, MD 07/05/14 [phone]

## 2014-07-07 ENCOUNTER — Non-Acute Institutional Stay (SKILLED_NURSING_FACILITY): Payer: Medicare Other | Admitting: Adult Health

## 2014-07-07 DIAGNOSIS — N058 Unspecified nephritic syndrome with other morphologic changes: Secondary | ICD-10-CM | POA: Diagnosis not present

## 2014-07-07 DIAGNOSIS — E1129 Type 2 diabetes mellitus with other diabetic kidney complication: Secondary | ICD-10-CM | POA: Diagnosis not present

## 2014-07-07 DIAGNOSIS — I739 Peripheral vascular disease, unspecified: Secondary | ICD-10-CM

## 2014-07-07 DIAGNOSIS — N186 End stage renal disease: Secondary | ICD-10-CM

## 2014-07-07 DIAGNOSIS — Z992 Dependence on renal dialysis: Secondary | ICD-10-CM | POA: Diagnosis not present

## 2014-07-07 DIAGNOSIS — E785 Hyperlipidemia, unspecified: Secondary | ICD-10-CM | POA: Diagnosis not present

## 2014-07-07 DIAGNOSIS — Z89512 Acquired absence of left leg below knee: Secondary | ICD-10-CM | POA: Diagnosis not present

## 2014-07-07 DIAGNOSIS — I5022 Chronic systolic (congestive) heart failure: Secondary | ICD-10-CM | POA: Diagnosis not present

## 2014-07-07 DIAGNOSIS — E43 Unspecified severe protein-calorie malnutrition: Secondary | ICD-10-CM | POA: Diagnosis not present

## 2014-07-08 ENCOUNTER — Encounter: Payer: Self-pay | Admitting: Cardiology

## 2014-07-08 ENCOUNTER — Ambulatory Visit: Payer: Medicare Other | Admitting: Nutrition

## 2014-07-08 NOTE — Progress Notes (Signed)
PATIENTJaelynne [name] MRN: [id number] DOB: 04/07/1945 PCP: [name], MD  Clinic Note: No chief complaint on file.   HPI: Elizabeth [name] is a 69 y.o. female with a PMH below who presents today for 3 month f/u - was last seen by Wilburt Finlay, PA-C. She has Dr. Rennis Golden listed as her cardiologist. She was also seen in consultation in the hospital by Dr. Olga Millers. She has a history of recent stroke as well as PAD (followed by Dr. Fabienne Bruns), hypertension and type 2 diabetes mellitus. Diagnosed with esophageal achalasia recently.  She was admitted to the hospital in late January with a left toe wound infection that had progressively worsening for several years. In the development of dry gangrene.   PV Angiogram 03/19/2014: Bilateral AP and PT artery occlusions, 90% Left peroneal artery with single-vessel Right peroneal and flow  Myoview Nuclear Stress Test 03/21/2014: EF 35%. Mild hypokinesis of the inferior wall with fixed inferior defect suggesting scar.  Echo 03/22/2014: EF 30-35%, mild LVH. Severe inferior hypokinesis. Mild MR.  Right Left Heart Catheterization: Moderate disease in p-mLAD & Ostial OM1; normal right heart pressures. Wedge pressure 29 mmHg. --No lesion to explain inferior defect on Myoview and echocardiogram.  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 Profoundly hypoglycemic and codominant. Had altered mental status. (Thought to be related to unintentional overdose on insulin)  Also noted to have severe protein calorie malnutrition, decubitus ulcers  Possible sepsis with hypothermia - short-term antibiotics treatment   Interval History:   The remainder of Cardiovascular ROS is  as follows:    Past Medical History  Diagnosis Date  . Coronary artery disease, non-occlusive January 2016    Moderate p&mLAD & Ostial OM; Myoview Nuclear Stress Test 03/21/2014: EF 35%. Mild hypokinesis of the inferior wall with fixed inferior defect suggesting scar. (no culprit lesion on CATH)  . 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 Critical lower limb ischemia     Status post left BKA following left popliteal and peroneal artery endarterectomy with patch angioplasty.  . Neuropathic pain   . Foley catheter in place   . Critical lower limb ischemia   . Hypertension   . Peripheral vascular disease   . Hypoglycemia 08/19/2013  . Hypokalemia 08/19/2013  . IDDM (insulin dependent diabetes mellitus)     Brittle  . Hematemesis 06/12/2014 hospitalized  . Stroke     "minor one"  . Arthritis     "right knee" (06/12/2014)  . Gout     "was in my LLE"  . ESRD (end stage renal disease)     [address], [place], [place] (06/12/2014)  . Urinary retention     DX NEUROGENIC BLADDER--HAS INDWELLING FOLEY CATHETER  . Foley catheter in place     for urinary retention  . Anemia     Prior Cardiac Evaluation and Past Surgical History: Past Surgical History  Procedure Laterality Date  . Insertion of suprapubic catheter N/A 05/16/2012    Procedure: INSERTION OF SUPRAPUBIC CATHETER;  Surgeon: Sebastian Ache, MD;  Location: [facility] ORS;  Service: Urology;  Laterality: N/A;  .  Bascilic vein transposition Left 10/30/2012    Procedure: LEFT 1ST STAGE BASCILIC VEIN TRANSPOSITION;  Surgeon: Chuck Hint, MD;  Location: [facility] OR;  Service: Vascular;  Laterality: Left;  . Nm myocar perf wall motion  04/19/2012/ 02/2014    a) low risk study-EF 44%,mild inferolateral hypkinesis; b) EF 35%. Mild hypokinesis of the inferior wall with fixed  inferior defect suggesting scar.  . Bascilic vein transposition Left 02/05/2013    Procedure: BASCILIC VEIN TRANSPOSITION 2ND STAGE;  Surgeon: Chuck Hint, MD;  Location: [facility] OR;  Service: Vascular;  Laterality: Left;  . Radiology with anesthesia N/A 10/25/2013    Procedure: RADIOLOGY WITH ANESTHESIA;  Surgeon: Medication Radiologist, MD;  Location: [facility] OR;  Service: Radiology;  Laterality: N/A;  . Fistulogram Left 04/29/2013    Procedure: FISTULOGRAM;  Surgeon: Chuck Hint, MD;  Location: [facility] CATH LAB;  Service: Cardiovascular;  Laterality: Left;  . Angioplasty Left 04/29/2013    Procedure: ANGIOPLASTY;  Surgeon: Chuck Hint, MD;  Location: [facility] CATH LAB;  Service: Cardiovascular;  Laterality: Left;  . Abdominal aortagram N/A 03/19/2014    Procedure: ABDOMINAL Ronny Flurry;  Surgeon: Sherren Kerns, MD;  Location: [facility] CATH LAB;  Service: Cardiovascular;  Laterality: N/A;  . Left and right heart catheterization with coronary angiogram N/A 03/24/2014    Procedure: LEFT AND RIGHT HEART CATHETERIZATION WITH CORONARY ANGIOGRAM;  Surgeon: Marykay Lex, MD;  Location: [facility] CATH LAB;  Service: Cardiovascular; Moderate disease in p-mLAD & Ostial OM1; normal right heart pressures. Wedge pressure 29 mmHg. --No lesion to explain inferior defect on Myoview and echocardiogram.   . Endarterectomy popliteal Left 03/26/2014    Procedure: Left popliteal and peroneal artery endarterectomy with vein patch angioplasty;  Surgeon: Sherren Kerns, MD;  Location: [facility] OR;  Service: Vascular;  Laterality: Left;  . Amputation Left 03/26/2014    Procedure: AMPUTATION SECOND LEFT TOE;  Surgeon: Sherren Kerns, MD;  Location: [facility] OR;  Service: Vascular;  Laterality: Left;  . Av fistula placement Left 04/28/2014    Procedure: CREATION OF LEFT BRACHIOCEPHALIC  ARTERIOVENOUS (AV) FISTULA CREATION;  Surgeon: Fransisco Hertz, MD;  Location: [facility] OR;  Service: Vascular;  Laterality: Left;  . Amputation Left 05/10/2014     Procedure: AMPUTATION BELOW KNEE;  Surgeon: Larina Earthly, MD;  Location: [facility] OR;  Service: Vascular;  Laterality: Left;  . Esophagogastroduodenoscopy N/A 06/13/2014    Procedure: ESOPHAGOGASTRODUODENOSCOPY (EGD);  Surgeon: Jeani Hawking, MD;  Location: [facility] ENDOSCOPY;  Service: Endoscopy;  Laterality: N/A;  . Transthoracic echocardiogram  03/22/2014    EF 30-35%, mild LVH. Severe inferior hypokinesis. Mild MR.    No Known Allergies  Current Outpatient Prescriptions  Medication Sig Dispense Refill  . Amino Acids-Protein Hydrolys (FEEDING SUPPLEMENT, PRO-STAT SUGAR FREE 64,) LIQD Take 30 mLs by mouth 3 (three) times daily with meals.    Marland Kitchen aspirin 81 MG tablet Take 81 mg by mouth daily.    . cetirizine (ZYRTEC) 10 MG tablet Take 10 mg by mouth as needed for allergies.    . Darbepoetin Alfa (ARANESP) 200 MCG/0.4ML SOSY injection Inject 0.4 mLs (200 mcg total) into the vein every Thursday with hemodialysis. 1.68 mL   . docusate sodium (COLACE) 50 MG capsule Take 1 capsule (50 mg total) by mouth 2 (two) times daily. Hold for diarrhea    . doxercalciferol (HECTOROL) 4 MCG/2ML injection Inject 0.5 mLs (1 mcg total) into the vein Every Tuesday,Thursday,and Saturday with dialysis. 2 mL   . glucose blood (ONETOUCH  VERIO) test strip Use as instructed to check blood sugar 4 times per day dx code E13.10 150 each 3  . insulin aspart (NOVOLOG) 100 UNIT/ML injection Inject 0-9 Units into the skin 3 (three) times daily with meals. Sliding scale CBG 70 - 120: 0 units CBG 121 - 150: 1 unit,  CBG 151 - 200: 2 units,  CBG 201 - 250: 3 units,  CBG 251 - 300: 5 units,  CBG 301 - 350: 7 units,  CBG 351 - 400: 9 units   CBG > 400: 9 units and notify your MD 10 mL 11  . insulin glargine (LANTUS) 100 UNIT/ML injection Inject 0.03 mLs (3 Units total) into the skin daily. 10 mL 11  . lidocaine-prilocaine (EMLA) cream Apply topically once.    . Multiple Vitamins-Minerals (DECUBI-VITE) CAPS Take 2 capsules by mouth daily.    .  Nutritional Supplements (FEEDING SUPPLEMENT, NEPRO CARB STEADY,) LIQD Take 237 mLs by mouth 2 (two) times daily between meals.    Letta Pate DELICA LANCETS FINE MISC Use to check blood sugar 4 times per day 200 each 3  . oxyCODONE-acetaminophen (PERCOCET/ROXICET) 5-325 MG per tablet Take 1 tablet by mouth every 8 (eight) hours as needed for moderate pain or severe pain. 10 tablet 0  . pantoprazole (PROTONIX) 40 MG tablet Take 1 tablet (40 mg total) by mouth daily.    . pravastatin (PRAVACHOL) 20 MG tablet Take 20 mg by mouth daily.    Marland Kitchen saccharomyces boulardii (FLORASTOR) 250 MG capsule Take 1 capsule (250 mg total) by mouth 2 (two) times daily.    . sevelamer carbonate (RENVELA) 800 MG tablet Take 1 tablet (800 mg total) by mouth 3 (three) times daily with meals.     No current facility-administered medications for this visit.   History  Substance Use Topics  . Smoking status: Never Smoker   . Smokeless tobacco: Never Used  . Alcohol Use: 0.0 oz/week    0 Standard drinks or equivalent per week     Comment: "stopped drinking in 2013 drank a couple beers/day, 2 days/wk"    family history includes Heart disease in her sister; Hodgkin's lymphoma in her father; Hyperlipidemia in her mother; Hypertension in her mother.  ROS: A comprehensive Review of Systems - was performed ROS   This encounter was created in error - please disregard.

## 2014-07-09 ENCOUNTER — Encounter: Payer: Medicare Other | Admitting: Cardiology

## 2014-07-12 DIAGNOSIS — L98429 Non-pressure chronic ulcer of back with unspecified severity: Secondary | ICD-10-CM | POA: Insufficient documentation

## 2014-07-22 ENCOUNTER — Ambulatory Visit: Payer: Medicare Other | Admitting: Endocrinology

## 2014-07-23 ENCOUNTER — Ambulatory Visit: Payer: Medicare Other | Admitting: Endocrinology

## 2014-07-23 ENCOUNTER — Telehealth: Payer: Self-pay | Admitting: Endocrinology

## 2014-07-23 NOTE — Telephone Encounter (Signed)
Patient no showed today's appt. Please advise on how to follow up. °A. No follow up necessary. °B. Follow up urgent. Contact patient immediately. °C. Follow up necessary. Contact patient and schedule visit in ___ days. °D. Follow up advised. Contact patient and schedule visit in ____weeks. ° °

## 2014-08-04 ENCOUNTER — Ambulatory Visit (HOSPITAL_COMMUNITY): Admission: RE | Admit: 2014-08-04 | Payer: Medicare Other | Source: Ambulatory Visit | Admitting: Gastroenterology

## 2014-08-04 ENCOUNTER — Encounter (HOSPITAL_COMMUNITY): Admission: RE | Payer: Self-pay | Source: Ambulatory Visit

## 2014-08-04 SURGERY — MANOMETRY, ESOPHAGUS

## 2014-08-08 ENCOUNTER — Non-Acute Institutional Stay (SKILLED_NURSING_FACILITY): Payer: Medicare Other | Admitting: Adult Health

## 2014-08-08 DIAGNOSIS — K5900 Constipation, unspecified: Secondary | ICD-10-CM

## 2014-08-08 DIAGNOSIS — E43 Unspecified severe protein-calorie malnutrition: Secondary | ICD-10-CM

## 2014-08-08 DIAGNOSIS — I5022 Chronic systolic (congestive) heart failure: Secondary | ICD-10-CM

## 2014-08-08 DIAGNOSIS — N186 End stage renal disease: Secondary | ICD-10-CM | POA: Diagnosis not present

## 2014-08-08 DIAGNOSIS — K5909 Other constipation: Secondary | ICD-10-CM

## 2014-08-08 DIAGNOSIS — I739 Peripheral vascular disease, unspecified: Secondary | ICD-10-CM | POA: Diagnosis not present

## 2014-08-08 DIAGNOSIS — N058 Unspecified nephritic syndrome with other morphologic changes: Secondary | ICD-10-CM | POA: Diagnosis not present

## 2014-08-08 DIAGNOSIS — E1129 Type 2 diabetes mellitus with other diabetic kidney complication: Secondary | ICD-10-CM | POA: Diagnosis not present

## 2014-08-08 DIAGNOSIS — Z89512 Acquired absence of left leg below knee: Secondary | ICD-10-CM

## 2014-08-13 ENCOUNTER — Encounter (HOSPITAL_COMMUNITY): Payer: Self-pay | Admitting: Emergency Medicine

## 2014-08-13 ENCOUNTER — Encounter (HOSPITAL_BASED_OUTPATIENT_CLINIC_OR_DEPARTMENT_OTHER): Payer: Medicare Other | Attending: Surgery

## 2014-08-13 ENCOUNTER — Emergency Department (HOSPITAL_COMMUNITY)
Admission: EM | Admit: 2014-08-13 | Discharge: 2014-08-13 | Disposition: A | Discharge status: Home / Self Care | Payer: Medicare Other | Attending: Emergency Medicine | Admitting: Emergency Medicine

## 2014-08-13 DIAGNOSIS — E1140 Type 2 diabetes mellitus with diabetic neuropathy, unspecified: Secondary | ICD-10-CM | POA: Insufficient documentation

## 2014-08-13 DIAGNOSIS — N186 End stage renal disease: Secondary | ICD-10-CM | POA: Diagnosis not present

## 2014-08-13 DIAGNOSIS — Z7982 Long term (current) use of aspirin: Secondary | ICD-10-CM | POA: Diagnosis not present

## 2014-08-13 DIAGNOSIS — M1711 Unilateral primary osteoarthritis, right knee: Secondary | ICD-10-CM | POA: Diagnosis not present

## 2014-08-13 DIAGNOSIS — D649 Anemia, unspecified: Secondary | ICD-10-CM | POA: Diagnosis not present

## 2014-08-13 DIAGNOSIS — E785 Hyperlipidemia, unspecified: Secondary | ICD-10-CM | POA: Insufficient documentation

## 2014-08-13 DIAGNOSIS — Z8719 Personal history of other diseases of the digestive system: Secondary | ICD-10-CM | POA: Insufficient documentation

## 2014-08-13 DIAGNOSIS — E11649 Type 2 diabetes mellitus with hypoglycemia without coma: Secondary | ICD-10-CM | POA: Diagnosis not present

## 2014-08-13 DIAGNOSIS — I120 Hypertensive chronic kidney disease with stage 5 chronic kidney disease or end stage renal disease: Secondary | ICD-10-CM | POA: Diagnosis not present

## 2014-08-13 DIAGNOSIS — E162 Hypoglycemia, unspecified: Secondary | ICD-10-CM

## 2014-08-13 DIAGNOSIS — E875 Hyperkalemia: Secondary | ICD-10-CM | POA: Diagnosis not present

## 2014-08-13 DIAGNOSIS — I2510 Atherosclerotic heart disease of native coronary artery without angina pectoris: Secondary | ICD-10-CM | POA: Diagnosis not present

## 2014-08-13 DIAGNOSIS — Z79899 Other long term (current) drug therapy: Secondary | ICD-10-CM | POA: Diagnosis not present

## 2014-08-13 DIAGNOSIS — Z8673 Personal history of transient ischemic attack (TIA), and cerebral infarction without residual deficits: Secondary | ICD-10-CM | POA: Insufficient documentation

## 2014-08-13 DIAGNOSIS — R112 Nausea with vomiting, unspecified: Secondary | ICD-10-CM | POA: Diagnosis present

## 2014-08-13 DIAGNOSIS — Z9889 Other specified postprocedural states: Secondary | ICD-10-CM | POA: Insufficient documentation

## 2014-08-13 DIAGNOSIS — R410 Disorientation, unspecified: Secondary | ICD-10-CM | POA: Diagnosis not present

## 2014-08-13 DIAGNOSIS — Z794 Long term (current) use of insulin: Secondary | ICD-10-CM | POA: Diagnosis not present

## 2014-08-13 DIAGNOSIS — Z992 Dependence on renal dialysis: Secondary | ICD-10-CM | POA: Insufficient documentation

## 2014-08-13 LAB — COMPREHENSIVE METABOLIC PANEL
ALT: 101 U/L — AB (ref 14–54)
AST: 77 U/L — ABNORMAL HIGH (ref 15–41)
Albumin: 3.4 g/dL — ABNORMAL LOW (ref 3.5–5.0)
Alkaline Phosphatase: 171 U/L — ABNORMAL HIGH (ref 38–126)
Anion gap: 13 (ref 5–15)
BILIRUBIN TOTAL: 0.9 mg/dL (ref 0.3–1.2)
BUN: 88 mg/dL — ABNORMAL HIGH (ref 6–20)
CALCIUM: 8.5 mg/dL — AB (ref 8.9–10.3)
CHLORIDE: 97 mmol/L — AB (ref 101–111)
CO2: 26 mmol/L (ref 22–32)
Creatinine, Ser: 5.5 mg/dL — ABNORMAL HIGH (ref 0.44–1.00)
GFR calc Af Amer: 8 mL/min — ABNORMAL LOW (ref >60)
GFR, EST NON AFRICAN AMERICAN: 7 mL/min — AB (ref >60)
Glucose, Bld: 28 mg/dL — CL (ref 65–99)
Potassium: 5.8 mmol/L — ABNORMAL HIGH (ref 3.5–5.1)
SODIUM: 136 mmol/L (ref 135–145)
Total Protein: 7.8 g/dL (ref 6.5–8.1)

## 2014-08-13 LAB — CBC WITH DIFFERENTIAL/PLATELET
BASOS ABS: 0 10*3/uL (ref 0.0–0.1)
BASOS PCT: 0 % (ref 0–1)
EOS ABS: 0.2 10*3/uL (ref 0.0–0.7)
Eosinophils Relative: 4 % (ref 0–5)
HCT: 43.1 % (ref 36.0–46.0)
Hemoglobin: 13.8 g/dL (ref 12.0–15.0)
LYMPHS PCT: 22 % (ref 12–46)
Lymphs Abs: 1.3 10*3/uL (ref 0.7–4.0)
MCH: 26.6 pg (ref 26.0–34.0)
MCHC: 32 g/dL (ref 30.0–36.0)
MCV: 83 fL (ref 78.0–100.0)
Monocytes Absolute: 0.5 10*3/uL (ref 0.1–1.0)
Monocytes Relative: 8 % (ref 3–12)
NEUTROS ABS: 3.7 10*3/uL (ref 1.7–7.7)
Neutrophils Relative %: 65 % (ref 43–77)
PLATELETS: 156 10*3/uL (ref 150–400)
RBC: 5.19 MIL/uL — AB (ref 3.87–5.11)
RDW: 19.3 % — ABNORMAL HIGH (ref 11.5–15.5)
WBC: 5.6 10*3/uL (ref 4.0–10.5)

## 2014-08-13 LAB — BASIC METABOLIC PANEL
ANION GAP: 14 (ref 5–15)
BUN: 86 mg/dL — ABNORMAL HIGH (ref 6–20)
CHLORIDE: 99 mmol/L — AB (ref 101–111)
CO2: 19 mmol/L — ABNORMAL LOW (ref 22–32)
CREATININE: 5.49 mg/dL — AB (ref 0.44–1.00)
Calcium: 8.2 mg/dL — ABNORMAL LOW (ref 8.9–10.3)
GFR, EST AFRICAN AMERICAN: 8 mL/min — AB (ref >60)
GFR, EST NON AFRICAN AMERICAN: 7 mL/min — AB (ref >60)
Glucose, Bld: 140 mg/dL — ABNORMAL HIGH (ref 65–99)
Potassium: 5.5 mmol/L — ABNORMAL HIGH (ref 3.5–5.1)
Sodium: 132 mmol/L — ABNORMAL LOW (ref 135–145)

## 2014-08-13 LAB — LIPASE, BLOOD: Lipase: 17 U/L — ABNORMAL LOW (ref 22–51)

## 2014-08-13 LAB — CBG MONITORING, ED
GLUCOSE-CAPILLARY: 22 mg/dL — AB (ref 65–99)
GLUCOSE-CAPILLARY: 165 mg/dL — AB (ref 65–99)

## 2014-08-13 MED ORDER — DEXTROSE 50 % IV SOLN
1.0000 | Freq: Once | INTRAVENOUS | Status: AC
  Administered 2014-08-13: 50 mL via INTRAVENOUS
  Filled 2014-08-13: qty 50

## 2014-08-13 NOTE — Discharge Instructions (Signed)
Hyperkalemia  Hyperkalemia is when you have too much potassium in your blood. This can be a life-threatening condition. Potassium is normally removed (excreted) from the body by the kidneys.  CAUSES   The potassium level in your body can become too high for the following reasons:   You take in too much potassium. You can do this by:   Using salt substitutes. They contain large amounts of potassium.   Taking potassium supplements from your caregiver. The dose may be too high for you.   Eating foods or taking nutritional products with potassium.   You excrete too little potassium. This can happen if:   Your kidneys are not functioning properly. Kidney (renal) disease is a very common cause of hyperkalemia.   You are taking medicines that lower your excretion of potassium, such as certain diuretic medicines.   You have an adrenal gland disease called Addison's disease.   You have a urinary tract obstruction, such as kidney stones.   You are on treatment to mechanically clean your blood (dialysis) and you skip a treatment.   You release a high amount of potassium from your cells into your blood. You may have a condition that causes potassium to move from your cells to your bloodstream. This can happen with:   Injury to muscles or other tissues. Most potassium is stored in the muscles.   Severe burns or infections.   Acidic blood plasma (acidosis). Acidosis can result from many diseases, such as uncontrolled diabetes.  SYMPTOMS   Usually, there are no symptoms unless the potassium is dangerously high or has risen very quickly. Symptoms may include:   Irregular or very slow heartbeat.   Feeling sick to your stomach (nauseous).   Tiredness (fatigue).   Nerve problems such as tingling of the skin, numbness of the hands or feet, weakness, or paralysis.  DIAGNOSIS   A simple blood test can measure the amount of potassium in your body. An electrocardiogram test of the heart can also help make the diagnosis.  The heart may beat dangerously fast or slow down and stop beating with severe hyperkalemia.   TREATMENT   Treatment depends on how bad the condition is and on the underlying cause.   If the hyperkalemia is an emergency (causing heart problems or paralysis), many different medicines can be used alone or together to lower the potassium level briefly. This may include an insulin injection even if you are not diabetic. Emergency dialysis may be needed to remove potassium from the body.   If the hyperkalemia is less severe or dangerous, the underlying cause is treated. This can include taking medicines if needed. Your prescription medicines may be changed. You may also need to take a medicine to help your body get rid of potassium. You may need to eat a diet low in potassium.  HOME CARE INSTRUCTIONS    Take medicines and supplements as directed by your caregiver.   Do not take any over-the-counter medicines, supplements, natural products, herbs, or vitamins without reviewing them with your caregiver. Certain supplements and natural food products can have high amounts of potassium. Other products (such as ibuprofen) can damage weak kidneys and raise your potassium.   You may be asked to do repeat lab tests. Be sure to follow these directions.   If you have kidney disease, you may need to follow a low potassium diet.  SEEK MEDICAL CARE IF:    You notice an irregular or very slow heartbeat.   You feel lightheaded.     these instructions.  Will watch your condition.  Will get help right away if you are not doing well or get worse. Document Released: 01/28/2002 Document Revised: 05/02/2011 Document Reviewed: 05/15/2013 [facility] Patient Information 2015 [place], [place]. This information is not intended to replace  advice given to you by your health care provider. Make sure you discuss any questions you have with your health care provider.  Hypoglycemia Hypoglycemia occurs when the glucose in your blood is too low. Glucose is a type of sugar that is your body's main energy source. Hormones, such as insulin and glucagon, control the level of glucose in the blood. Insulin lowers blood glucose and glucagon increases blood glucose. Having too much insulin in your blood stream, or not eating enough food containing sugar, can result in hypoglycemia. Hypoglycemia can happen to people with or without diabetes. It can develop quickly and can be a medical emergency.  CAUSES   Missing or delaying meals.  Not eating enough carbohydrates at meals.  Taking too much diabetes medicine.  Not timing your oral diabetes medicine or insulin doses with meals, snacks, and exercise.  Nausea and vomiting.  Certain medicines.  Severe illnesses, such as hepatitis, kidney disorders, and certain eating disorders.  Increased activity or exercise without eating something extra or adjusting medicines.  Drinking too much alcohol.  A nerve disorder that affects body functions like your heart rate, blood pressure, and digestion (autonomic neuropathy).  A condition where the stomach muscles do not function properly (gastroparesis). Therefore, medicines and food may not absorb properly.  Rarely, a tumor of the pancreas can produce too much insulin. SYMPTOMS   Hunger.  Sweating (diaphoresis).  Change in body temperature.  Shakiness.  Headache.  Anxiety.  Lightheadedness.  Irritability.  Difficulty concentrating.  Dry mouth.  Tingling or numbness in the hands or feet.  Restless sleep or sleep disturbances.  Altered speech and coordination.  Change in mental status.  Seizures or prolonged convulsions.  Combativeness.  Drowsiness (lethargic).  Weakness.  Increased heart rate or  palpitations.  Confusion.  Pale, gray skin color.  Blurred or double vision.  Fainting. DIAGNOSIS  A physical exam and medical history will be performed. Your caregiver may make a diagnosis based on your symptoms. Blood tests and other lab tests may be performed to confirm a diagnosis. Once the diagnosis is made, your caregiver will see if your signs and symptoms go away once your blood glucose is raised.  TREATMENT  Usually, you can easily treat your hypoglycemia when you notice symptoms.  Check your blood glucose. If it is less than 70 mg/dl, take one of the following:   3-4 glucose tablets.    cup juice.    cup regular soda.   1 cup skim milk.   -1 tube of glucose gel.   5-6 hard candies.   Avoid high-fat drinks or food that may delay a rise in blood glucose levels.  Do not take more than the recommended amount of sugary foods, drinks, gel, or tablets. Doing so will cause your blood glucose to go too high.   Wait 10-15 minutes and recheck your blood glucose. If it is still less than 70 mg/dl or below your target range, repeat treatment.   Eat a snack if it is more than 1 hour until your next meal.  There may be a time when your blood glucose may go so low that you are unable to treat yourself at home when you start to notice symptoms. You may need someone  to help you. You may even faint or be unable to swallow. If you cannot treat yourself, someone will need to bring you to the hospital.  HOME CARE INSTRUCTIONS  If you have diabetes, follow your diabetes management plan by:  Taking your medicines as directed.  Following your exercise plan.  Following your meal plan. Do not skip meals. Eat on time.  Testing your blood glucose regularly. Check your blood glucose before and after exercise. If you exercise longer or different than usual, be sure to check blood glucose more frequently.  Wearing your medical alert jewelry that says you have  diabetes.  Identify the cause of your hypoglycemia. Then, develop ways to prevent the recurrence of hypoglycemia.  Do not take a hot bath or shower right after an insulin shot.  Always carry treatment with you. Glucose tablets are the easiest to carry.  If you are going to drink alcohol, drink it only with meals.  Tell friends or family members ways to keep you safe during a seizure. This may include removing hard or sharp objects from the area or turning you on your side.  Maintain a healthy weight. SEEK MEDICAL CARE IF:   You are having problems keeping your blood glucose in your target range.  You are having frequent episodes of hypoglycemia.  You feel you might be having side effects from your medicines.  You are not sure why your blood glucose is dropping so low.  You notice a change in vision or a new problem with your vision. SEEK IMMEDIATE MEDICAL CARE IF:   Confusion develops.  A change in mental status occurs.  The inability to swallow develops.  Fainting occurs. Document Released: 02/07/2005 Document Revised: 02/12/2013 Document Reviewed: 06/06/2011 [facility] Patient Information 2015 [place], [place]. This information is not intended to replace advice given to you by your health care provider. Make sure you discuss any questions you have with your health care provider.

## 2014-08-13 NOTE — ED Notes (Signed)
Upon assessment pt found very confused, unable to answer questions, slow to respond, checked CBG it was 22. PIV started and D50 IV given. Within minutes pt was able to answer questions, sts she is currently residing at [facility] rehab facility  And was brought to wound care center by [facility]. Pt sts she didn't have anything to eat this morning and felt nauseous and vomited once at the wound care center, so someone there helped her to wheelchair and dropped her off in the ED waiting room.

## 2014-08-13 NOTE — ED Notes (Signed)
Pt now reports she doesn't need an ambulance for transport, sts she called transportation company and they are coming to pick her up. Pt asked to be wheeled to waiting room.

## 2014-08-13 NOTE — ED Notes (Signed)
Pt states she was on her way to the wound care center to have the wound on her rectum examined when she got nauseous and vomited a small amount. Pt denies any pain

## 2014-08-13 NOTE — ED Provider Notes (Signed)
CSN: [id number]     Arrival date & time 08/13/14  1102 History   First MD Initiated Contact with Patient 08/13/14 1148     Chief Complaint  Patient presents with  . Nausea  . Emesis     (Consider location/radiation/quality/duration/timing/severity/associated sxs/prior Treatment) Patient is a 69 y.o. female presenting with vomiting. The history is provided by the patient.  Emesis Associated symptoms: no abdominal pain, no diarrhea and no headaches    patient presented with nausea and vomiting. Had reportedly been of wound care center and then was sent here. Is a dialysis patient is due to be dialyzed tomorrow. The wound care visit was to see if she could be cleared to go to assisted living. Initially confused and having some difficulty speaking. Initial CBG was checked here and found to be 22. Patient drove herself to the ER. Patient's mental status improved and said she's been feeling fine otherwise. States that she was not given enough to eat today and was given too much insulin for what she ate. No headache. No chest pain.  Past Medical History  Diagnosis Date  . Coronary artery disease, non-occlusive January 2016    Moderate p&mLAD & Ostial OM; Myoview Nuclear Stress Test 03/21/2014: EF 35%. Mild hypokinesis of the inferior wall with fixed inferior defect suggesting scar. (no culprit lesion on CATH)  . 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 Critical lower limb ischemia     Status post left BKA following left popliteal and peroneal artery endarterectomy with patch angioplasty.  . Diabetes mellitus, insulin dependent (IDDM), uncontrolled     Brittle; has h/o Hypo & Hyperglycemic episodes  . Diabetic neuropathy   . Essential hypertension   . Hyperlipidemia with target LDL less than 70   . History of CVA  (cerebrovascular accident) 08/19/2013    "minor one"  . ESRD (end stage renal disease)     [address], [place], S (06/12/2014)  . Urinary retention     DX NEUROGENIC BLADDER--HAS INDWELLING FOLEY CATHETER  . Foley catheter in place     for urinary retention  . Anemia   . Hematemesis April 2016    Hospitalized  . Arthritis of knee, right     "right knee" (06/12/2014)  . Gout     "was in my LLE"   Past Surgical History  Procedure Laterality Date  . Insertion of suprapubic catheter N/A 05/16/2012    Procedure: INSERTION OF SUPRAPUBIC CATHETER;  Surgeon: Sebastian Ache, MD;  Location: [facility] ORS;  Service: Urology;  Laterality: N/A;  . Bascilic vein transposition Left 10/30/2012    Procedure: LEFT 1ST STAGE BASCILIC VEIN TRANSPOSITION;  Surgeon: Chuck Hint, MD;  [facility] OR;  Service: Vascular;  Laterality: Left;  . Nm myocar perf wall motion  04/19/2012/ 02/2014    a) low risk study-EF 44%,mild inferolateral hypkinesis; b) EF 35%. Mild hypokinesis of the inferior wall with fixed inferior defect suggesting scar.  . Bascilic vein transposition Left 02/05/2013    Procedure: BASCILIC VEIN TRANSPOSITION 2ND STAGE;  Surgeon: Chuck Hint, MD;  Location: [facility] OR;  Service: Vascular;  Laterality: Left;  . Radiology with anesthesia N/A 10/25/2013    Procedure: RADIOLOGY WITH ANESTHESIA;  Surgeon: Medication Radiologist, MD;  Location: [facility] OR;  Service: Radiology;  Laterality: N/A;  . Fistulogram Left 04/29/2013    Procedure:  FISTULOGRAM;  Surgeon: Chuck Hint, MD;  Location: [facility] CATH LAB;  Service: Cardiovascular;  Laterality: Left;  . Angioplasty Left 04/29/2013    Procedure: ANGIOPLASTY;  Surgeon: Chuck Hint, MD;  Location: [facility] CATH LAB;  Service: Cardiovascular;  Laterality: Left;  . Abdominal aortagram N/A 03/19/2014    Procedure: ABDOMINAL Ronny Flurry;  Surgeon: Sherren Kerns, MD;  Location: [facility] CATH LAB;  Service: Cardiovascular;  Laterality: N/A;  . Left and right  heart catheterization with coronary angiogram N/A 03/24/2014    Procedure: LEFT AND RIGHT HEART CATHETERIZATION WITH CORONARY ANGIOGRAM;  Surgeon: Marykay Lex, MD;  Location: [facility] CATH LAB;  Service: Cardiovascular; Moderate disease in p-mLAD & Ostial OM1; normal right heart pressures. Wedge pressure 29 mmHg. --No lesion to explain inferior defect on Myoview and echocardiogram.   . Endarterectomy popliteal Left 03/26/2014    Procedure: Left popliteal and peroneal artery endarterectomy with vein patch angioplasty;  Surgeon: Sherren Kerns, MD;  Location: [facility] OR;  Service: Vascular;  Laterality: Left;  . Amputation Left 03/26/2014    Procedure: AMPUTATION SECOND LEFT TOE;  Surgeon: Sherren Kerns, MD;  Location: [facility] OR;  Service: Vascular;  Laterality: Left;  . Av fistula placement Left 04/28/2014    Procedure: CREATION OF LEFT BRACHIOCEPHALIC  ARTERIOVENOUS (AV) FISTULA CREATION;  Surgeon: Fransisco Hertz, MD;  Location: [facility] OR;  Service: Vascular;  Laterality: Left;  . Amputation Left 05/10/2014    Procedure: AMPUTATION BELOW KNEE;  Surgeon: Larina Earthly, MD;  Location: [facility] OR;  Service: Vascular;  Laterality: Left;  . Esophagogastroduodenoscopy N/A 06/13/2014    Procedure: ESOPHAGOGASTRODUODENOSCOPY (EGD);  Surgeon: Jeani Hawking, MD;  Location: [facility] ENDOSCOPY;  Service: Endoscopy;  Laterality: N/A;  . Transthoracic echocardiogram  03/22/2014    EF 30-35%, mild LVH. Severe inferior hypokinesis. Mild MR.   Family History  Problem Relation Age of Onset  . Hyperlipidemia Mother   . Hypertension Mother   . Hodgkin's lymphoma Father   . Heart disease Sister    History  Substance Use Topics  . Smoking status: Never Smoker   . Smokeless tobacco: Never Used  . Alcohol Use: 0.0 oz/week    0 Standard drinks or equivalent per week     Comment: "stopped drinking in 2013 drank a couple beers/day, 2 days/wk"   OB History    No data available     Review of Systems  Constitutional: Negative for activity change  and appetite change.  Eyes: Negative for pain.  Respiratory: Negative for chest tightness and shortness of breath.   Cardiovascular: Negative for chest pain and leg swelling.  Gastrointestinal: Positive for vomiting. Negative for nausea, abdominal pain and diarrhea.  Genitourinary: Negative for flank pain.  Musculoskeletal: Negative for back pain and neck stiffness.  Skin: Positive for wound. Negative for rash.  Neurological: Negative for weakness, numbness and headaches.  Psychiatric/Behavioral: Positive for confusion. Negative for behavioral problems.      Allergies  Review of patient's allergies indicates no known allergies.  Home Medications   Prior to Admission medications   Medication Sig Start Date End Date Taking? Authorizing Provider  Amino Acids-Protein Hydrolys (FEEDING SUPPLEMENT, PRO-STAT SUGAR FREE 64,) LIQD Take 30 mLs by mouth 3 (three) times daily with meals.   Yes Historical Provider, MD  aspirin 81 MG tablet Take 81 mg by mouth daily.   Yes Historical Provider, MD  cetirizine (ZYRTEC) 10 MG tablet Take 10 mg by mouth as needed for allergies.   Yes Historical Provider, MD  doxercalciferol (HECTOROL) 4 MCG/2ML injection Inject 0.5 mLs (1 mcg total) into the vein Every Tuesday,Thursday,and Saturday with dialysis. 05/13/14  Yes Joseph Art, DO  glucose blood (ONETOUCH VERIO) test strip Use as instructed to check blood sugar 4 times per day dx code E13.10 02/17/14  Yes [name], MD  insulin aspart (NOVOLOG) 100 UNIT/ML injection Inject 0-9 Units into the skin 3 (three) times daily with meals. Sliding scale CBG 70 - 120: 0 units CBG 121 - 150: 1 unit,  CBG 151 - 200: 2 units,  CBG 201 - 250: 3 units,  CBG 251 - 300: 5 units,  CBG 301 - 350: 7 units,  CBG 351 - 400: 9 units   CBG > 400: 9 units and notify your MD Patient taking differently: Inject 0-5 Units into the skin 3 (three) times daily with meals. Sliding Scale: 0 - 149 = 0 units, 150+ = 5 units 06/16/14  Yes Ripudeep  K Rai, MD  insulin glargine (LANTUS) 100 UNIT/ML injection Inject 0.03 mLs (3 Units total) into the skin daily. Patient taking differently: Inject 3 Units into the skin at bedtime.  06/29/14  Yes Vassie Loll, MD  lidocaine-prilocaine (EMLA) cream Apply 1 application topically as needed.    Yes Historical Provider, MD  Multiple Vitamins-Minerals (DECUBI-VITE) CAPS Take 2 capsules by mouth daily.   Yes Historical Provider, MD  Nutritional Supplements (FEEDING SUPPLEMENT, NEPRO CARB STEADY,) LIQD Take 237 mLs by mouth 2 (two) times daily between meals. 06/28/14  Yes Vassie Loll, MD  [facility] DELICA LANCETS FINE MISC Use to check blood sugar 4 times per day 09/16/13  Yes [name], MD  oxyCODONE-acetaminophen (PERCOCET/ROXICET) 5-325 MG per tablet Take 1 tablet by mouth every 8 (eight) hours as needed for moderate pain or severe pain. 06/28/14  Yes Vassie Loll, MD  pantoprazole (PROTONIX) 40 MG tablet Take 1 tablet (40 mg total) by mouth daily. Patient taking differently: Take 40 mg by mouth daily with breakfast.  05/13/14  Yes Joseph Art, DO  pravastatin (PRAVACHOL) 20 MG tablet Take 20 mg by mouth every evening.    Yes Historical Provider, MD  promethazine (PHENERGAN) 25 MG tablet Take 25 mg by mouth every 6 (six) hours as needed for nausea or vomiting.   Yes Historical Provider, MD  saccharomyces boulardii (FLORASTOR) 250 MG capsule Take 1 capsule (250 mg total) by mouth 2 (two) times daily. 03/28/14  Yes Starleen Arms, MD  senna-docusate (SENOKOT-S) 8.6-50 MG per tablet Take 1 tablet by mouth 2 (two) times daily.   Yes Historical Provider, MD  sevelamer carbonate (RENVELA) 800 MG tablet Take 1 tablet (800 mg total) by mouth 3 (three) times daily with meals. 12/28/13  Yes Ripudeep Jenna Luo, MD  Darbepoetin Alfa (ARANESP) 200 MCG/0.4ML SOSY injection Inject 0.4 mLs (200 mcg total) into the vein every Thursday with hemodialysis. Patient not taking: Reported on 08/13/2014 05/15/14   Joseph Art, DO   docusate sodium (COLACE) 50 MG capsule Take 1 capsule (50 mg total) by mouth 2 (two) times daily. Hold for diarrhea Patient not taking: Reported on 08/13/2014 06/28/14   Vassie Loll, MD   BP 133/78 mmHg  Pulse 94  Temp(Src) 97.7 F (36.5 C) (Oral)  Resp 16  SpO2 99% Physical Exam  Constitutional: She is oriented to person, place, and time. She appears well-developed and well-nourished.  Neck: Neck supple.  Cardiovascular: Normal rate and regular rhythm.   Abdominal: Soft.  Neurological: She is alert and oriented to  person, place, and time.  Skin: Skin is warm.    ED Course  Procedures (including critical care time) Labs Review Labs Reviewed  COMPREHENSIVE METABOLIC PANEL - Abnormal; Notable for the following:    Potassium 5.8 (*)    Chloride 97 (*)    Glucose, Bld 28 (*)    BUN 88 (*)    Creatinine, Ser 5.50 (*)    Calcium 8.5 (*)    Albumin 3.4 (*)    AST 77 (*)    ALT 101 (*)    Alkaline Phosphatase 171 (*)    GFR calc non Af Amer 7 (*)    GFR calc Af Amer 8 (*)    All other components within normal limits  LIPASE, BLOOD - Abnormal; Notable for the following:    Lipase 17 (*)    All other components within normal limits  CBC WITH DIFFERENTIAL/PLATELET - Abnormal; Notable for the following:    RBC 5.19 (*)    RDW 19.3 (*)    All other components within normal limits  BASIC METABOLIC PANEL - Abnormal; Notable for the following:    Sodium 132 (*)    Potassium 5.5 (*)    Chloride 99 (*)    CO2 19 (*)    Glucose, Bld 140 (*)    BUN 86 (*)    Creatinine, Ser 5.49 (*)    Calcium 8.2 (*)    GFR calc non Af Amer 7 (*)    GFR calc Af Amer 8 (*)    All other components within normal limits  CBG MONITORING, ED - Abnormal; Notable for the following:    Glucose-Capillary 22 (*)    All other components within normal limits  CBG MONITORING, ED - Abnormal; Notable for the following:    Glucose-Capillary 165 (*)    All other components within normal limits    Imaging  Review No results found.   EKG Interpretation   Date/Time:  Wednesday August 13 2014 15:53:03 EDT Ventricular Rate:  97 PR Interval:  142 QRS Duration: 86 QT Interval:  391 QTC Calculation: 497 R Axis:   -62 Text Interpretation:  Sinus rhythm Left anterior fascicular block Probable  anteroseptal infarct, old Confirmed by Nyeemah Jennette  MD, Harrold Donath [phone]) on  08/13/2014 4:00:27 PM      MDM   Final diagnoses:  Hypoglycemia  End stage renal disease on dialysis  Hyperkalemia    Patient with altered mental status and vomiting likely came from her hypoglycemia. She is a dialysis patient is due for dialysis tomorrow. Potassium is minimally elevated. Due for dialysis in the morning and will not give Kayexalate. Patient feels better and will be discharged home. No EKG changes of hyperkalemia.    Benjiman Core, MD 08/13/14 [phone]

## 2014-08-13 NOTE — ED Notes (Signed)
CBG - 28 per lab  Notified primary nurse

## 2014-08-13 NOTE — ED Notes (Addendum)
Report given to [facility], nurse at [facility] paged from transport

## 2014-08-22 ENCOUNTER — Encounter (HOSPITAL_BASED_OUTPATIENT_CLINIC_OR_DEPARTMENT_OTHER): Payer: Medicare Other | Attending: Internal Medicine

## 2014-08-22 DIAGNOSIS — I120 Hypertensive chronic kidney disease with stage 5 chronic kidney disease or end stage renal disease: Secondary | ICD-10-CM | POA: Diagnosis not present

## 2014-08-22 DIAGNOSIS — L89153 Pressure ulcer of sacral region, stage 3: Secondary | ICD-10-CM | POA: Insufficient documentation

## 2014-08-22 DIAGNOSIS — E11621 Type 2 diabetes mellitus with foot ulcer: Secondary | ICD-10-CM | POA: Diagnosis present

## 2014-08-22 DIAGNOSIS — E0851 Diabetes mellitus due to underlying condition with diabetic peripheral angiopathy without gangrene: Secondary | ICD-10-CM | POA: Insufficient documentation

## 2014-08-22 DIAGNOSIS — I2510 Atherosclerotic heart disease of native coronary artery without angina pectoris: Secondary | ICD-10-CM | POA: Diagnosis not present

## 2014-08-22 DIAGNOSIS — M1990 Unspecified osteoarthritis, unspecified site: Secondary | ICD-10-CM | POA: Diagnosis not present

## 2014-08-22 DIAGNOSIS — L97511 Non-pressure chronic ulcer of other part of right foot limited to breakdown of skin: Secondary | ICD-10-CM | POA: Diagnosis not present

## 2014-08-22 DIAGNOSIS — E1140 Type 2 diabetes mellitus with diabetic neuropathy, unspecified: Secondary | ICD-10-CM | POA: Diagnosis not present

## 2014-08-22 DIAGNOSIS — N186 End stage renal disease: Secondary | ICD-10-CM | POA: Diagnosis not present

## 2014-08-25 NOTE — Progress Notes (Signed)
Patient ID: Elizabeth [name], female   DOB: [birth year], 69 y.o.   MRN: [id number]  Elizabeth [name] living [place]     No Known Allergies     Chief Complaint  Patient presents with  . Acute Visit    follow up transfer     HPI:  She had been discharged to home; but was unsuccessful. She was seen in the ED for hypoglycemia. Her placement here is more than likely long term. She is not voicing any complaints or concerns. There are no nursing concerns at this time.    Past Medical History  Diagnosis Date  . Coronary artery disease, non-occlusive January 2016    Moderate p&mLAD & Ostial OM; Myoview Nuclear Stress Test 03/21/2014: EF 35%. Mild hypokinesis of the inferior wall with fixed inferior defect suggesting scar. (no culprit lesion on CATH)  . 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 Critical lower limb ischemia     Status post left BKA following left popliteal and peroneal artery endarterectomy with patch angioplasty.  . Diabetes mellitus, insulin dependent (IDDM), uncontrolled     Brittle; has h/o Hypo & Hyperglycemic episodes  . Diabetic neuropathy   . Essential hypertension   . Hyperlipidemia with target LDL less than 70   . History of CVA (cerebrovascular accident) 08/19/2013    "minor one"  . ESRD (end stage renal disease)     [address], [place], [place] (06/12/2014)  . Urinary retention     DX NEUROGENIC BLADDER--HAS INDWELLING FOLEY CATHETER  . Foley catheter in place     for urinary retention  . Anemia   . Hematemesis April 2016    Hospitalized  . Arthritis of knee, right     "right knee" (06/12/2014)  . Gout     "was in my LLE"    Past Surgical History  Procedure Laterality Date  . Insertion of suprapubic catheter N/A 05/16/2012    Procedure: INSERTION OF SUPRAPUBIC CATHETER;  Surgeon: Elizabeth Ache, [name];  Location: [facility] ORS;  Service: Urology;  Laterality: N/A;  . Bascilic vein transposition Left 10/30/2012    Procedure: LEFT 1ST STAGE BASCILIC VEIN TRANSPOSITION;  Surgeon: Elizabeth Hint, [name];  Location: [facility] OR;  Service: Vascular;  Laterality: Left;  . Nm myocar perf wall motion  04/19/2012/ 02/2014    a) low risk study-EF 44%,mild inferolateral hypkinesis; b) EF 35%. Mild hypokinesis of the inferior wall with fixed inferior defect suggesting scar.  . Bascilic vein transposition Left 02/05/2013    Procedure: BASCILIC VEIN TRANSPOSITION 2ND STAGE;  Surgeon: Elizabeth Hint, [name];  Location: [facility] OR;  Service: Vascular;  Laterality: Left;  . Radiology with anesthesia N/A 10/25/2013    Procedure: RADIOLOGY WITH ANESTHESIA;  Surgeon: Elizabeth [name];  Location: [facility] OR;  Service: Radiology;  Laterality: N/A;  . Fistulogram Left 04/29/2013    Procedure: FISTULOGRAM;  Surgeon: Elizabeth Hint, [name];  Location: [facility] CATH LAB;  Service: Cardiovascular;  Laterality: Left;  . Angioplasty Left 04/29/2013    Procedure: ANGIOPLASTY;  Surgeon: Elizabeth Hint, [name];  Location: [facility] CATH LAB;  Service: Cardiovascular;  Laterality: Left;  . Abdominal aortagram N/A 03/19/2014    Procedure: ABDOMINAL Ronny Flurry;  Surgeon: Elizabeth Kerns, [name];  Location: [facility] CATH LAB;  Service: Cardiovascular;  Laterality: N/A;  . Left and right heart catheterization with coronary angiogram N/A 03/24/2014  Procedure: LEFT AND RIGHT HEART CATHETERIZATION WITH CORONARY ANGIOGRAM;  Surgeon: Elizabeth Lex, [name];  Location: [facility] CATH LAB;  Service: Cardiovascular; Moderate disease in p-mLAD & Ostial OM1; normal right heart pressures. Wedge pressure 29 mmHg. --No lesion to explain inferior defect on Myoview and echocardiogram.   . Endarterectomy popliteal Left 03/26/2014    Procedure: Left popliteal and peroneal artery endarterectomy with vein patch angioplasty;  Surgeon: Elizabeth Kerns, [name];  Location: [facility] OR;  Service:  Vascular;  Laterality: Left;  . Amputation Left 03/26/2014    Procedure: AMPUTATION SECOND LEFT TOE;  Surgeon: Elizabeth Kerns, [name];  Location: [facility] OR;  Service: Vascular;  Laterality: Left;  . Av fistula placement Left 04/28/2014    Procedure: CREATION OF LEFT BRACHIOCEPHALIC  ARTERIOVENOUS (AV) FISTULA CREATION;  Surgeon: Elizabeth Hertz, [name];  Location: [facility] OR;  Service: Vascular;  Laterality: Left;  . Amputation Left 05/10/2014    Procedure: AMPUTATION BELOW KNEE;  Surgeon: Elizabeth Earthly, [name];  Location: [facility] OR;  Service: Vascular;  Laterality: Left;  . Esophagogastroduodenoscopy N/A 06/13/2014    Procedure: ESOPHAGOGASTRODUODENOSCOPY (EGD);  Surgeon: Elizabeth Hawking, [name];  Location: [facility] ENDOSCOPY;  Service: Endoscopy;  Laterality: N/A;  . Transthoracic echocardiogram  03/22/2014    EF 30-35%, mild LVH. Severe inferior hypokinesis. Mild MR.    VITAL SIGNS BP 107/65 mmHg  Pulse 100  Ht 5\' 3"  (1.6 m)  Wt 81 lb (36.741 kg)  BMI 14.35 kg/m2  SpO2 96%   Outpatient Encounter Prescriptions as of 07/07/2014  Elizabeth Sig   . Amino Acids-Protein Hydrolys (FEEDING SUPPLEMENT, PRO-STAT SUGAR FREE 64,) LIQD Take 30 mLs by mouth 3 (three) times daily with meals.   Coreg 6.25 mg  6.25 mg twice daily    Colace 100 mg  Take 1 daily    novolog  5 units ac meals for cbg >=150   lantus  10 units nightly    Percocet 5/325 mg  1 every 8 hours as needed    claritin 10 mg  10 mg daily as needed   . Darbepoetin Alfa (ARANESP) 200 MCG/0.4ML SOSY injection Inject 0.4 mLs (200 mcg total) into the vein every Thursday with hemodialysis.  Marland Kitchen doxercalciferol (HECTOROL) 4 MCG/2ML injection Inject 0.5 mLs (1 mcg total) into the vein Every Tuesday,Thursday,and Saturday with dialysis.  . Multiple Vitamins-Minerals (DECUBI-VITE) CAPS Take 2 capsules by mouth daily.  . pantoprazole (PROTONIX) 40 MG tablet Take 1 tablet (40 mg total) by mouth daily.  . pravastatin (PRAVACHOL) 20 MG tablet Take 20 mg by mouth daily.  Marland Kitchen saccharomyces  boulardii (FLORASTOR) 250 MG capsule Take 1 capsule (250 mg total) by mouth 2 (two) times daily.  . sevelamer carbonate (RENVELA) 800 MG tablet Take 1 tablet (800 mg total) by mouth 3 (three) times daily with meals.       SIGNIFICANT DIAGNOSTIC EXAMS  05-04-14: chest x-ray: mild chf with bilateral air space disease and small bilateral pleural effusion   05-09-14: chest x-ray: 1. Stable mild cardiomegaly. 2. Decreased pleural effusions and improved aeration.  05-09-14: left foot x-ray: Acute osteomyelitis involving in the residual 2nd metatarsal, 3rd metatarsal head, and 3rd proximal phalanx.Associated pathologic fracture of the 3rd proximal phalanx.     LABS REVIEWED:   04-01-14: wbc 12.1; hgb 9.6; hct 34.7; mcv 92.4; plt 434 05-02-14: urine culture: neg  05-09-14: wbc 8.5; hgb 11.;4 hct 37.1; mcv 87.1 plt 252; glucose 299; bun 43; creat 3.49; k+ 3.7; na++135; liver normal albumin 2.1; hgb a1c 7.0; tsh  1.242 05-11-14: wbc 9.7; hgb 10.8; hct 37.1; mcv 90.3; plt 213; glucose 114; bun 24; creat 3.12; k+4.5; na++136  05-12-14: wbc 11.9; hgb 10.4; hct 33.8; mcv 87.3 plt 225; glucose 151; bun 52; creat 4.52; k+5.0; na++135  06-28-14: wbc 5.9; hgb 11.8; hct 37.7; mcv 84.2; plt 103; glucose 86; bun 52; creat 3.74; k+5.5; na++134; albumin 2.2; phos 5.4      ROS Constitutional: Negative for malaise/fatigue.  Respiratory: Negative for cough and shortness of breath.   Cardiovascular: Negative for chest pain.  Gastrointestinal: Negative for heartburn and abdominal pain.  Musculoskeletal: Negative for myalgias and joint pain.  Skin:       Is status post left bka  Psychiatric/Behavioral: The patient is not nervous/anxious.       Physical Exam Constitutional: No distress.  Frail   Neck: Neck supple. No JVD present.  Cardiovascular: Normal rate, regular rhythm and intact distal pulses.   Murmur heard. Respiratory: Effort normal and breath sounds normal. No respiratory distress.  GI: Soft. Bowel  sounds are normal. She exhibits no distension. There is no tenderness.  Has foley  Musculoskeletal: She exhibits no edema.  Is able to move extremities   Neurological: She is alert.  Skin: Skin is warm and dry. She is not diaphoretic.  Left arm A/V fistula: +thril +bruitright upper chest dialysis access  Status post left bka; no signs of infection present.         ASSESSMENT/ PLAN:  1.  ESRD: is followed by nephrology; is on hemodialysis three days per week; is on sevelamer 800 mg with meals; has 1200 cc fluid restriction  2. PAD is status post left bka; has percocet 5/325 mg every 8 hours as needed for pain and percocet 5.325 mg every 8 hours as needed  Will continue therapy as directed.   3. Chronic systolic heart failure: will continue coreg 6.25 mg twice daily asa 81 mg daily will monitor  4. Diabetes: will continue lantus 10 units nightly and novolog 5 units prior to meals for cbg >=150 her hgb a1c is 7.0   5. Dyslipidemia: will continue pravachol 20 mg daily   6. Gerd: will continue protonix 40 mg daily   7. Protein calorie malnutrition: will continue prostat 30 cc three times daily her albumin is 2.1   Time spent with patient 50 minutes.    Synthia Innocent NP [facility]  Contact [phone] Monday through Friday 8am- 5pm  After hours call [phone]

## 2014-08-29 DIAGNOSIS — E11621 Type 2 diabetes mellitus with foot ulcer: Secondary | ICD-10-CM | POA: Diagnosis not present

## 2014-08-29 DIAGNOSIS — E0851 Diabetes mellitus due to underlying condition with diabetic peripheral angiopathy without gangrene: Secondary | ICD-10-CM | POA: Diagnosis not present

## 2014-08-29 DIAGNOSIS — L97511 Non-pressure chronic ulcer of other part of right foot limited to breakdown of skin: Secondary | ICD-10-CM | POA: Diagnosis not present

## 2014-08-29 DIAGNOSIS — L89153 Pressure ulcer of sacral region, stage 3: Secondary | ICD-10-CM | POA: Diagnosis not present

## 2014-09-02 ENCOUNTER — Other Ambulatory Visit: Payer: Self-pay | Admitting: *Deleted

## 2014-09-02 MED ORDER — OXYCODONE-ACETAMINOPHEN 5-325 MG PO TABS: ORAL_TABLET | ORAL | Status: DC

## 2014-09-02 NOTE — Telephone Encounter (Signed)
[facility] GL GBO 

## 2014-09-03 ENCOUNTER — Encounter: Payer: Self-pay | Admitting: Endocrinology

## 2014-09-03 ENCOUNTER — Ambulatory Visit (INDEPENDENT_AMBULATORY_CARE_PROVIDER_SITE_OTHER): Payer: Medicare Other | Admitting: Endocrinology

## 2014-09-03 VITALS — BP 122/70 | HR 110 | Temp 98.3°F | Resp 14 | Ht 63.0 in

## 2014-09-03 DIAGNOSIS — E1065 Type 1 diabetes mellitus with hyperglycemia: Secondary | ICD-10-CM | POA: Diagnosis not present

## 2014-09-03 DIAGNOSIS — I2510 Atherosclerotic heart disease of native coronary artery without angina pectoris: Secondary | ICD-10-CM

## 2014-09-03 DIAGNOSIS — IMO0002 Reserved for concepts with insufficient information to code with codable children: Secondary | ICD-10-CM

## 2014-09-03 NOTE — Progress Notes (Signed)
Patient ID: Elizabeth [name], female   DOB: 1945/05/22, 69 y.o.   MRN: [id number]    Reason for Appointment: Follow-up for Diabetes  Referring physician: Lerry Liner  History of Present Illness:          Diagnosis: Type 2 diabetes mellitus, date of diagnosis: 1996        Past history:  She was initially treated with metformin and glyburide and her detailed history or level of control is not available She was switched to insulin probably in 2013 She had been taking a regimen of Lantus at night and NovoLog before meals prior to her initial consultation  Recent history:   INSULIN regimen is described as: Lantus 7 units at bedtime, NovoLog 5 units at meals if blood sugar over 150  She has not been seen in follow-up since 06/2014 She was again in the hospital and has had a left below-knee amputation She currently in a rehabilitation nursing facility  On her last visit she was taking 7 units of Lantus at night but not she is taking only 3 units Also she is not taking any Novolog insulin when blood sugars are below 150 at mealtimes Also does not have a correction factor for high blood sugars  Her A1c tends to be lower than expected possibly because of her anemia and end-stage renal disease  Current blood sugar patterns and problems with management:  She is having her blood sugar checked about 3-4 times a day to nursing home but difficult to analyze her blood sugars as they are not organized by time of day; also appears to sometimes they are entered late in the day and not at the time it was taken  She thinks that she is not getting her Novolog before eating and sometimes this is given about half an hour after eating especially at night  Fasting blood sugars: These are recently mostly high although she had a couple of readings near normal about a week ago  Her highest blood sugars are AFTER supper at night, recently consistently over 200  Sometimes has high readings at  suppertime also but not consistently  Blood sugars are relatively better before lunch  No hypoglycemia recently although she thinks she may have had a low sugar of 22 sometime ago, this is not recorded at the nursing home but was low on her labs last month  Unclear what kind of diet she is getting   Glucose monitoring: 3-4 times a day Glucometer:  unknown  Blood sugars by nursing home record:  PRE-MEAL Breakfast Lunch Dinner Bedtime Overall  Glucose range:  76-214   129-192   139-265   159-298    Mean/median:        Glycemic control:    Lab Results  Component Value Date   HGBA1C 6.7* 06/26/2014   HGBA1C 6.7* 06/12/2014   HGBA1C 6.5* 06/12/2014   Lab Results  Component Value Date   LDLCALC 76 03/21/2014   CREATININE 5.49* 08/13/2014     Self-care: The diet that the patient has been following is: None       Meals: 3 meals per day. Breakfast is variable, today had grits, lunch is a sandwich and ginger ale. Mixed meal at dinner, snacks are sugar-free desserts, fruit or applesauce         Exercise: None           Dietician visit: Most recent: Several years ago.  consultation with CDE in 09/2013  Weight history:  Wt Readings from Last 3 Encounters:  07/07/14 81 lb (36.741 kg)  06/28/14 82 lb 10.8 oz (37.5 kg)  06/20/14 81 lb (36.741 kg)       Medication List       This list is accurate as of: 09/03/14  8:17 PM.  Always use your most recent med list.               amoxicillin-clavulanate 875-125 MG per tablet  Commonly known as:  AUGMENTIN  Take 1 tablet by mouth 2 (two) times daily.     aspirin 81 MG tablet  Take 81 mg by mouth daily.     cetirizine 10 MG tablet  Commonly known as:  ZYRTEC  Take 10 mg by mouth as needed for allergies.     Darbepoetin Alfa 200 MCG/0.4ML Sosy injection  Commonly known as:  ARANESP  Inject 0.4 mLs (200 mcg total) into the vein every Thursday with hemodialysis.     DECUBI-VITE Caps  Take 2 capsules by mouth  daily.     docusate sodium 50 MG capsule  Commonly known as:  COLACE  Take 1 capsule (50 mg total) by mouth 2 (two) times daily. Hold for diarrhea     doxercalciferol 4 MCG/2ML injection  Commonly known as:  HECTOROL  Inject 0.5 mLs (1 mcg total) into the vein Every Tuesday,Thursday,and Saturday with dialysis.     feeding supplement (NEPRO CARB STEADY) Liqd  Take 237 mLs by mouth 2 (two) times daily between meals.     feeding supplement (PRO-STAT SUGAR FREE 64) Liqd  Take 30 mLs by mouth 3 (three) times daily with meals.     glucose blood test strip  Commonly known as:  ONETOUCH VERIO  Use as instructed to check blood sugar 4 times per day dx code E13.10     insulin aspart 100 UNIT/ML injection  Commonly known as:  novoLOG  Inject 0-9 Units into the skin 3 (three) times daily with meals. Sliding scale CBG 70 - 120: 0 units CBG 121 - 150: 1 unit,  CBG 151 - 200: 2 units,  CBG 201 - 250: 3 units,  CBG 251 - 300: 5 units,  CBG 301 - 350: 7 units,  CBG 351 - 400: 9 units   CBG > 400: 9 units and notify your MD     insulin glargine 100 UNIT/ML injection  Commonly known as:  LANTUS  Inject 0.03 mLs (3 Units total) into the skin daily.     lidocaine-prilocaine cream  Commonly known as:  EMLA  Apply 1 application topically as needed.     ONETOUCH DELICA LANCETS FINE Misc  Use to check blood sugar 4 times per day     oxyCODONE-acetaminophen 5-325 MG per tablet  Commonly known as:  PERCOCET/ROXICET  Take one tablet by mouth every 8 hours as needed for pain. Not to exceed 3gm APAP from all sources/24 hours     pantoprazole 40 MG tablet  Commonly known as:  PROTONIX  Take 1 tablet (40 mg total) by mouth daily.     pravastatin 20 MG tablet  Commonly known as:  PRAVACHOL  Take 20 mg by mouth every evening.     promethazine 25 MG tablet  Commonly known as:  PHENERGAN  Take 25 mg by mouth every 6 (six) hours as needed for nausea or vomiting.     saccharomyces boulardii 250 MG  capsule  Commonly known as:  FLORASTOR  Take 1 capsule (250  mg total) by mouth 2 (two) times daily.     senna-docusate 8.6-50 MG per tablet  Commonly known as:  Senokot-S  Take 1 tablet by mouth 2 (two) times daily.     sevelamer carbonate 800 MG tablet  Commonly known as:  RENVELA  Take 1 tablet (800 mg total) by mouth 3 (three) times daily with meals.        Allergies: No Known Allergies  Past Medical History  Diagnosis Date  . Coronary artery disease, non-occlusive January 2016    Moderate p&mLAD & Ostial OM; Myoview Nuclear Stress Test 03/21/2014: EF 35%. Mild hypokinesis of the inferior wall with fixed inferior defect suggesting scar. (no culprit lesion on CATH)  . 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 Critical lower limb ischemia     Status post left BKA following left popliteal and peroneal artery endarterectomy with patch angioplasty.  . Diabetes mellitus, insulin dependent (IDDM), uncontrolled     Brittle; has h/o Hypo & Hyperglycemic episodes  . Diabetic neuropathy   . Essential hypertension   . Hyperlipidemia with target LDL less than 70   . History of CVA (cerebrovascular accident) 08/19/2013    "minor one"  . ESRD (end stage renal disease)     [address], [place], [place] (06/12/2014)  . Urinary retention     DX NEUROGENIC BLADDER--HAS INDWELLING FOLEY CATHETER  . Foley catheter in place     for urinary retention  . Anemia   . Hematemesis April 2016    Hospitalized  . Arthritis of knee, right     "right knee" (06/12/2014)  . Gout     "was in my LLE"    Past Surgical History  Procedure Laterality Date  . Insertion of suprapubic catheter N/A 05/16/2012    Procedure: INSERTION OF SUPRAPUBIC CATHETER;  Surgeon: Sebastian Ache, MD;  Location: [facility] ORS;  Service: Urology;  Laterality: N/A;    . Bascilic vein transposition Left 10/30/2012    Procedure: LEFT 1ST STAGE BASCILIC VEIN TRANSPOSITION;  Surgeon: Chuck Hint, MD;  Location: [facility] OR;  Service: Vascular;  Laterality: Left;  . Nm myocar perf wall motion  04/19/2012/ 02/2014    a) low risk study-EF 44%,mild inferolateral hypkinesis; b) EF 35%. Mild hypokinesis of the inferior wall with fixed inferior defect suggesting scar.  . Bascilic vein transposition Left 02/05/2013    Procedure: BASCILIC VEIN TRANSPOSITION 2ND STAGE;  Surgeon: Chuck Hint, MD;  Location: [facility] OR;  Service: Vascular;  Laterality: Left;  . Radiology with anesthesia N/A 10/25/2013    Procedure: RADIOLOGY WITH ANESTHESIA;  Surgeon: Medication Radiologist, MD;  Location: [facility] OR;  Service: Radiology;  Laterality: N/A;  . Fistulogram Left 04/29/2013    Procedure: FISTULOGRAM;  Surgeon: Chuck Hint, MD;  Location: [facility] CATH LAB;  Service: Cardiovascular;  Laterality: Left;  . Angioplasty Left 04/29/2013    Procedure: ANGIOPLASTY;  Surgeon: Chuck Hint, MD;  Location: [facility] CATH LAB;  Service: Cardiovascular;  Laterality: Left;  . Abdominal aortagram N/A 03/19/2014    Procedure: ABDOMINAL Ronny Flurry;  Surgeon: Sherren Kerns, MD;  Location: [facility] CATH LAB;  Service: Cardiovascular;  Laterality: N/A;  . Left and right heart catheterization with coronary angiogram N/A 03/24/2014    Procedure: LEFT AND RIGHT HEART CATHETERIZATION WITH CORONARY ANGIOGRAM;  Surgeon: Marykay Lex, MD;  Location: [facility]  CATH LAB;  Service: Cardiovascular; Moderate disease in p-mLAD & Ostial OM1; normal right heart pressures. Wedge pressure 29 mmHg. --No lesion to explain inferior defect on Myoview and echocardiogram.   . Endarterectomy popliteal Left 03/26/2014    Procedure: Left popliteal and peroneal artery endarterectomy with vein patch angioplasty;  Surgeon: Sherren Kerns, MD;  Location: [facility] OR;  Service: Vascular;  Laterality: Left;  . Amputation Left 03/26/2014     Procedure: AMPUTATION SECOND LEFT TOE;  Surgeon: Sherren Kerns, MD;  Location: [facility] OR;  Service: Vascular;  Laterality: Left;  . Av fistula placement Left 04/28/2014    Procedure: CREATION OF LEFT BRACHIOCEPHALIC  ARTERIOVENOUS (AV) FISTULA CREATION;  Surgeon: Fransisco Hertz, MD;  Location: [facility] OR;  Service: Vascular;  Laterality: Left;  . Amputation Left 05/10/2014    Procedure: AMPUTATION BELOW KNEE;  Surgeon: Larina Earthly, MD;  Location: [facility] OR;  Service: Vascular;  Laterality: Left;  . Esophagogastroduodenoscopy N/A 06/13/2014    Procedure: ESOPHAGOGASTRODUODENOSCOPY (EGD);  Surgeon: Jeani Hawking, MD;  Location: [facility] ENDOSCOPY;  Service: Endoscopy;  Laterality: N/A;  . Transthoracic echocardiogram  03/22/2014    EF 30-35%, mild LVH. Severe inferior hypokinesis. Mild MR.    Family History  Problem Relation Age of Onset  . Hyperlipidemia Mother   . Hypertension Mother   . Hodgkin's lymphoma Father   . Heart disease Sister     Social History:  reports that she has never smoked. She has never used smokeless tobacco. She reports that she drinks alcohol. She reports that she does not use illicit drugs.    Review of Systems   She recently had left below-knee amputation  Eyes: She has poor vision for unknown reasons       Lipids: . Taking pravastatin 20 mg       Lab Results  Component Value Date   CHOL 152 03/21/2014   HDL 54 03/21/2014   LDLCALC 76 03/21/2014   TRIG 109 03/21/2014   CHOLHDL 2.8 03/21/2014                  The blood pressure has been high previously for several years but currently only on low-dose carvedilol, followed by nephrologist     She has had end-stage kidney disease on dialysis    Previous foot exam had shown pulses are absent in the feet. Has mild decrease in monofilament sensation in  Toes.  She thinks she has an ulcer on her right foot which is healing She is asking for diabetic shoes but is still not walking and has not obtained a  prosthesis.  Physical Examination:  BP 122/70 mmHg  Pulse 110  Temp(Src) 98.3 F (36.8 C)  Resp 14  Ht 5\' 3"  (1.6 m)  Wt   SpO2 92%     ASSESSMENT:  Diabetes type 2, insulin requiring, nonobese See history of present illness for current management, problems identified in blood sugar patterns Difficult to assess her control as her blood sugar readings are not organized on her nursing home record  She thinks that some of her difficulties with nursing home management is getting her Novolog after eating rather than before Also not clear if she is getting a consistent balanced diet Currently has a relatively high reading in the morning despite taking Lantus at night; however blood sugars being highest at bedtime indicates Lantus is not lasting 24 hours  Hyperlipidemia:  controlled with pravastatin, will repeat levels on the next visit   PLAN:   Written  instructions given in detail for nursing home as follows, will need to try taking her Lantus in the morning instead of evening for better 24 hour control especially around mealtimes  She does need some insulin for mealtime coverage regardless of blood sugar unless below 90  Will need follow-up fructosamine and A1c to evaluate overall control  Will defer any diabetic shoes until she has a prosthesis for her left leg amputation  Patient Instructions  INSULIN instructions:  NOVOLOG: This must be taken right before or right after eating and not later  NOVOLOG insulin doses:   Mealtime coverage to be given regardless of blood sugar except may hold the insulin if blood sugar is below 90  At breakfast time give 2 units, at lunchtime give 4 units and at suppertime give 6 units  HIGH blood sugars: Additional insulin to be given as follows: For blood sugars over 200 give extra 1 unit, over 250 give extra 2 units in addition to the mealtime coverage  LANTUS insulin: Change this to the morning from tonight and start giving 5 units  daily   Counseling time on subjects discussed above is over 50% of today's 25 minute visit   Rejina Odle 09/03/2014, 8:17 PM   Note: This office note was prepared with Dragon voice recognition system technology. Any transcriptional errors that result from this process are unintentional.

## 2014-09-03 NOTE — Patient Instructions (Signed)
INSULIN instructions:  NOVOLOG: This must be taken right before or right after eating and not later  NOVOLOG insulin doses:   Mealtime coverage to be given regardless of blood sugar except may hold the insulin if blood sugar is below 90  At breakfast time give 2 units, at lunchtime give 4 units and at suppertime give 6 units  HIGH blood sugars: Additional insulin to be given as follows: For blood sugars over 200 give extra 1 unit, over 250 give extra 2 units in addition to the mealtime coverage  LANTUS insulin: Change this to the morning from tonight and start giving 5 units daily

## 2014-09-12 DIAGNOSIS — L89153 Pressure ulcer of sacral region, stage 3: Secondary | ICD-10-CM | POA: Diagnosis not present

## 2014-09-12 DIAGNOSIS — E0851 Diabetes mellitus due to underlying condition with diabetic peripheral angiopathy without gangrene: Secondary | ICD-10-CM | POA: Diagnosis not present

## 2014-09-12 DIAGNOSIS — E11621 Type 2 diabetes mellitus with foot ulcer: Secondary | ICD-10-CM | POA: Diagnosis not present

## 2014-09-12 DIAGNOSIS — L97511 Non-pressure chronic ulcer of other part of right foot limited to breakdown of skin: Secondary | ICD-10-CM | POA: Diagnosis not present

## 2014-09-19 ENCOUNTER — Non-Acute Institutional Stay (SKILLED_NURSING_FACILITY): Payer: Medicare Other | Admitting: Adult Health

## 2014-09-19 DIAGNOSIS — Z992 Dependence on renal dialysis: Secondary | ICD-10-CM | POA: Diagnosis not present

## 2014-09-19 DIAGNOSIS — I739 Peripheral vascular disease, unspecified: Secondary | ICD-10-CM | POA: Diagnosis not present

## 2014-09-19 DIAGNOSIS — K5900 Constipation, unspecified: Secondary | ICD-10-CM

## 2014-09-19 DIAGNOSIS — L89154 Pressure ulcer of sacral region, stage 4: Secondary | ICD-10-CM | POA: Diagnosis not present

## 2014-09-19 DIAGNOSIS — N186 End stage renal disease: Secondary | ICD-10-CM | POA: Diagnosis not present

## 2014-09-19 DIAGNOSIS — E1122 Type 2 diabetes mellitus with diabetic chronic kidney disease: Secondary | ICD-10-CM

## 2014-09-19 DIAGNOSIS — L98429 Non-pressure chronic ulcer of back with unspecified severity: Secondary | ICD-10-CM

## 2014-09-19 DIAGNOSIS — Z794 Long term (current) use of insulin: Secondary | ICD-10-CM

## 2014-09-19 DIAGNOSIS — I5022 Chronic systolic (congestive) heart failure: Secondary | ICD-10-CM

## 2014-09-19 DIAGNOSIS — E785 Hyperlipidemia, unspecified: Secondary | ICD-10-CM | POA: Diagnosis not present

## 2014-09-19 DIAGNOSIS — K5909 Other constipation: Secondary | ICD-10-CM

## 2014-09-26 ENCOUNTER — Encounter (HOSPITAL_BASED_OUTPATIENT_CLINIC_OR_DEPARTMENT_OTHER): Payer: Medicare Other | Attending: Internal Medicine

## 2014-09-26 DIAGNOSIS — L89153 Pressure ulcer of sacral region, stage 3: Secondary | ICD-10-CM | POA: Diagnosis not present

## 2014-09-26 DIAGNOSIS — N186 End stage renal disease: Secondary | ICD-10-CM | POA: Insufficient documentation

## 2014-09-26 DIAGNOSIS — L97511 Non-pressure chronic ulcer of other part of right foot limited to breakdown of skin: Secondary | ICD-10-CM | POA: Insufficient documentation

## 2014-09-26 DIAGNOSIS — M869 Osteomyelitis, unspecified: Secondary | ICD-10-CM | POA: Diagnosis not present

## 2014-09-26 DIAGNOSIS — I739 Peripheral vascular disease, unspecified: Secondary | ICD-10-CM | POA: Diagnosis not present

## 2014-09-26 DIAGNOSIS — M1990 Unspecified osteoarthritis, unspecified site: Secondary | ICD-10-CM | POA: Diagnosis not present

## 2014-09-26 DIAGNOSIS — M109 Gout, unspecified: Secondary | ICD-10-CM | POA: Diagnosis not present

## 2014-09-26 DIAGNOSIS — I10 Essential (primary) hypertension: Secondary | ICD-10-CM | POA: Insufficient documentation

## 2014-09-26 DIAGNOSIS — E1151 Type 2 diabetes mellitus with diabetic peripheral angiopathy without gangrene: Secondary | ICD-10-CM | POA: Diagnosis not present

## 2014-09-26 DIAGNOSIS — E1140 Type 2 diabetes mellitus with diabetic neuropathy, unspecified: Secondary | ICD-10-CM | POA: Diagnosis not present

## 2014-09-26 DIAGNOSIS — Z992 Dependence on renal dialysis: Secondary | ICD-10-CM | POA: Diagnosis not present

## 2014-09-26 DIAGNOSIS — E11621 Type 2 diabetes mellitus with foot ulcer: Secondary | ICD-10-CM | POA: Insufficient documentation

## 2014-09-26 DIAGNOSIS — D649 Anemia, unspecified: Secondary | ICD-10-CM | POA: Insufficient documentation

## 2014-09-26 DIAGNOSIS — I2510 Atherosclerotic heart disease of native coronary artery without angina pectoris: Secondary | ICD-10-CM | POA: Insufficient documentation

## 2014-10-02 ENCOUNTER — Encounter: Payer: Self-pay | Admitting: Vascular Surgery

## 2014-10-03 ENCOUNTER — Encounter: Payer: Self-pay | Admitting: Vascular Surgery

## 2014-10-03 ENCOUNTER — Other Ambulatory Visit: Payer: Self-pay

## 2014-10-03 ENCOUNTER — Ambulatory Visit (INDEPENDENT_AMBULATORY_CARE_PROVIDER_SITE_OTHER): Payer: Medicare Other | Admitting: Vascular Surgery

## 2014-10-03 VITALS — BP 118/68 | HR 102 | Temp 97.9°F | Ht 63.0 in | Wt 81.0 lb

## 2014-10-03 DIAGNOSIS — I2510 Atherosclerotic heart disease of native coronary artery without angina pectoris: Secondary | ICD-10-CM | POA: Diagnosis not present

## 2014-10-03 DIAGNOSIS — Z992 Dependence on renal dialysis: Secondary | ICD-10-CM | POA: Diagnosis not present

## 2014-10-03 DIAGNOSIS — N186 End stage renal disease: Secondary | ICD-10-CM

## 2014-10-03 NOTE — Progress Notes (Signed)
  Established Dialysis Access  History of Present Illness  Elizabeth [name] is a 69 y.o. (11/13/1945) female who presents for re-evaluation of L BC AVF.  Pt is s/p L BC AVF 04/28/14 done by myself.  Reportedly Dr. Lin did a L arm fistulogram which demonstrated proximal cephalic vein stenosis.  He sent her for evaluation for a cephalic vein turndown.  No steal sx to date.  Pt reportedly dialyzing via L BC AVF.  Past Medical History  Diagnosis Date  . Coronary artery disease, non-occlusive January 2016    Moderate p&mLAD & Ostial OM; Myoview Nuclear Stress Test 03/21/2014: EF 35%. Mild hypokinesis of the inferior wall with fixed inferior defect suggesting scar. (no culprit lesion on CATH)  . 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 Critical lower limb ischemia     Status post left BKA following left popliteal and peroneal artery endarterectomy with patch angioplasty.  . Diabetes mellitus, insulin dependent (IDDM), uncontrolled     Brittle; has h/o Hypo & Hyperglycemic episodes  . Diabetic neuropathy   . Essential hypertension   . Hyperlipidemia with target LDL less than 70   . History of CVA (cerebrovascular accident) 08/19/2013    "minor one"  . ESRD (end stage renal disease)     [address] T, TH, S (06/12/2014)  . Urinary retention     DX NEUROGENIC BLADDER--HAS INDWELLING FOLEY CATHETER  . Foley catheter in place     for urinary retention  . Anemia   . Hematemesis April 2016    Hospitalized  . Arthritis of knee, right     "right knee" (06/12/2014)  . Gout     "was in my LLE"    Past Surgical History  Procedure Laterality Date  . Insertion of suprapubic catheter N/A 05/16/2012    Procedure: INSERTION OF SUPRAPUBIC CATHETER;  Surgeon: Theodore Manny, MD;  Location: [facility];  Service: Urology;   Laterality: N/A;  . Bascilic vein transposition Left 10/30/2012    Procedure: LEFT 1ST STAGE BASCILIC VEIN TRANSPOSITION;  Surgeon: Christopher S Dickson, MD;  Location: [facility];  Service: Vascular;  Laterality: Left;  . Nm myocar perf wall motion  04/19/2012/ 02/2014    a) low risk study-EF 44%,mild inferolateral hypkinesis; b) EF 35%. Mild hypokinesis of the inferior wall with fixed inferior defect suggesting scar.  . Bascilic vein transposition Left 02/05/2013    Procedure: BASCILIC VEIN TRANSPOSITION 2ND STAGE;  Surgeon: Christopher S Dickson, MD;  Location: [facility];  Service: Vascular;  Laterality: Left;  . Radiology with anesthesia N/A 10/25/2013    Procedure: RADIOLOGY WITH ANESTHESIA;  Surgeon: Medication Radiologist, MD;  Location: [facility];  Service: Radiology;  Laterality: N/A;  . Fistulogram Left 04/29/2013    Procedure: FISTULOGRAM;  Surgeon: Christopher S Dickson, MD;  Location: [facility];  Service: Cardiovascular;  Laterality: Left;  . Angioplasty Left 04/29/2013    Procedure: ANGIOPLASTY;  Surgeon: Christopher S Dickson, MD;  Location: [facility];  Service: Cardiovascular;  Laterality: Left;  . Abdominal aortagram N/A 03/19/2014    Procedure: ABDOMINAL AORTAGRAM;  Surgeon: Charles E Fields, MD;  Location: [facility];  Service: Cardiovascular;  Laterality: N/A;  . Left and right heart catheterization with coronary angiogram N/A 03/24/2014    Procedure: LEFT AND RIGHT HEART CATHETERIZATION WITH CORONARY ANGIOGRAM;  Surgeon: David W Harding, MD;    Location: [facility];  Service: Cardiovascular; Moderate disease in p-mLAD & Ostial OM1; normal right heart pressures. Wedge pressure 29 mmHg. --No lesion to explain inferior defect on Myoview and echocardiogram.   . Endarterectomy popliteal Left 03/26/2014    Procedure: Left popliteal and peroneal artery endarterectomy with vein patch angioplasty;  Surgeon: Charles E Fields, MD;  Location: [facility];  Service: Vascular;  Laterality: Left;  . Amputation Left  03/26/2014    Procedure: AMPUTATION SECOND LEFT TOE;  Surgeon: Charles E Fields, MD;  Location[facility];  Service: Vascular;  Laterality: Left;  . Av fistula placement Left 04/28/2014    Procedure: CREATION OF LEFT BRACHIOCEPHALIC  ARTERIOVENOUS (AV) FISTULA CREATION;  Surgeon: Neda Willenbring L Elyssia Strausser, MD;  Location: [facility];  Service: Vascular;  Laterality: Left;  . Amputation Left 05/10/2014    Procedure: AMPUTATION BELOW KNEE;  Surgeon: Todd F Early, MD;  Location: [facility];  Service: Vascular;  Laterality: Left;  . Esophagogastroduodenoscopy N/A 06/13/2014    Procedure: ESOPHAGOGASTRODUODENOSCOPY (EGD);  Surgeon: Patrick Hung, MD;  Location: [facility];  Service: Endoscopy;  Laterality: N/A;  . Transthoracic echocardiogram  03/22/2014    EF 30-35%, mild LVH. Severe inferior hypokinesis. Mild MR.    Social History   Social History  . Marital Status: Single    Spouse Name: N/A  . Number of Children: N/A  . Years of Education: N/A   Occupational History  . Not on file.   Social History Main Topics  . Smoking status: Never Smoker   . Smokeless tobacco: Never Used  . Alcohol Use: 0.0 oz/week    0 Standard drinks or equivalent per week     Comment: "stopped drinking in 2013 drank a couple beers/day, 2 days/wk"  . Drug Use: No  . Sexual Activity: No   Other Topics Concern  . Not on file   Social History Narrative    Family History  Problem Relation Age of Onset  . Hyperlipidemia Mother   . Hypertension Mother   . Hodgkin's lymphoma Father   . Heart disease Sister      Current Outpatient Prescriptions  Medication Sig Dispense Refill  . Amino Acids-Protein Hydrolys (FEEDING SUPPLEMENT, PRO-STAT SUGAR FREE 64,) LIQD Take 30 mLs by mouth 3 (three) times daily with meals.    . aspirin 81 MG tablet Take 81 mg by mouth daily.    . Besifloxacin HCl (BESIVANCE) 0.6 % SUSP Apply 1 drop to eye 3 (three) times daily.    . cetirizine (ZYRTEC) 10 MG tablet Take 10 mg by mouth as needed for allergies.      . Difluprednate (DUREZOL OP) Apply 1 drop to eye 3 (three) times daily.    . insulin aspart (NOVOLOG) 100 UNIT/ML injection Inject 0-9 Units into the skin 3 (three) times daily with meals. Sliding scale CBG 70 - 120: 0 units CBG 121 - 150: 1 unit,  CBG 151 - 200: 2 units,  CBG 201 - 250: 3 units,  CBG 251 - 300: 5 units,  CBG 301 - 350: 7 units,  CBG 351 - 400: 9 units   CBG > 400: 9 units and notify your MD (Patient taking differently: Inject 0-5 Units into the skin 3 (three) times daily with meals. Sliding Scale: 0 - 149 = 0 units, 150+ = 5 units) 10 mL 11  . insulin glargine (LANTUS) 100 UNIT/ML injection Inject 0.03 mLs (3 Units total) into the skin daily. (Patient taking differently: Inject 3 Units into the skin at   bedtime. ) 10 mL 11  . lidocaine-prilocaine (EMLA) cream Apply 1 application topically as needed.     . Multiple Vitamins-Minerals (DECUBI-VITE) CAPS Take 2 capsules by mouth daily.    . NEPAFENAC OP Apply 1 drop to eye at bedtime and may repeat dose one time if needed.    . Nutritional Supplements (FEEDING SUPPLEMENT, NEPRO CARB STEADY,) LIQD Take 237 mLs by mouth 2 (two) times daily between meals.    . pantoprazole (PROTONIX) 40 MG tablet Take 1 tablet (40 mg total) by mouth daily. (Patient taking differently: Take 40 mg by mouth daily with breakfast. )    . pravastatin (PRAVACHOL) 20 MG tablet Take 20 mg by mouth every evening.     . promethazine (PHENERGAN) 25 MG tablet Take 25 mg by mouth every 6 (six) hours as needed for nausea or vomiting.    . saccharomyces boulardii (FLORASTOR) 250 MG capsule Take 1 capsule (250 mg total) by mouth 2 (two) times daily.    . senna-docusate (SENOKOT-S) 8.6-50 MG per tablet Take 1 tablet by mouth 2 (two) times daily.    . sevelamer carbonate (RENVELA) 800 MG tablet Take 1 tablet (800 mg total) by mouth 3 (three) times daily with meals.    . amoxicillin-clavulanate (AUGMENTIN) 875-125 MG per tablet Take 1 tablet by mouth 2 (two) times daily.    .  Darbepoetin Alfa (ARANESP) 200 MCG/0.4ML SOSY injection Inject 0.4 mLs (200 mcg total) into the vein every Thursday with hemodialysis. (Patient not taking: Reported on 10/03/2014) 1.68 mL   . docusate sodium (COLACE) 50 MG capsule Take 1 capsule (50 mg total) by mouth 2 (two) times daily. Hold for diarrhea (Patient not taking: Reported on 10/03/2014)    . doxercalciferol (HECTOROL) 4 MCG/2ML injection Inject 0.5 mLs (1 mcg total) into the vein Every Tuesday,Thursday,and Saturday with dialysis. (Patient not taking: Reported on 10/03/2014) 2 mL   . glucose blood (ONETOUCH VERIO) test strip Use as instructed to check blood sugar 4 times per day dx code E13.10 (Patient not taking: Reported on 10/03/2014) 150 each 3  . LANTUS SOLOSTAR 100 UNIT/ML Solostar Pen     . ONETOUCH DELICA LANCETS FINE MISC Use to check blood sugar 4 times per day (Patient not taking: Reported on 10/03/2014) 200 each 3  . oxyCODONE-acetaminophen (PERCOCET/ROXICET) 5-325 MG per tablet Take one tablet by mouth every 8 hours as needed for pain. Not to exceed 3gm APAP from all sources/24 hours (Patient not taking: Reported on 10/03/2014) 90 tablet 0   No current facility-administered medications for this visit.     No Known Allergies   REVIEW OF SYSTEMS:  (Positives checked otherwise negative)  CARDIOVASCULAR:   [ ] chest pain,  [ ] chest pressure,  [ ] palpitations,  [ ] shortness of breath when laying flat,  [ ] shortness of breath with exertion,   [ ] pain in feet when walking,  [ ] pain in feet when laying flat, [ ] history of blood clot in veins (DVT),  [ ] history of phlebitis,  [ ] swelling in legs,  [ ] varicose veins  PULMONARY:   [ ] productive cough,  [ ] asthma,  [ ] wheezing  NEUROLOGIC:   [ ] weakness in arms or legs,  [ ] numbness in arms or legs,  [ ] difficulty speaking or slurred speech,  [ ] temporary loss of vision in one eye,  [ ] dizziness  HEMATOLOGIC:   [ ] bleeding problems,  [ ]   problems  with blood clotting too easily  MUSCULOSKEL:   [ ] joint pain, [ ] joint swelling  GASTROINTEST:   [ ] vomiting blood,  [ ] blood in stool     GENITOURINARY:   [ ] burning with urination,  [ ] blood in urine [x] ESRD-HD: T-R-S  PSYCHIATRIC:   [ ] history of major depression  INTEGUMENTARY:   [ ] rashes,  [ ] ulcers  CONSTITUTIONAL:   [ ] fever,  [ ] chills   Physical Examination  Filed Vitals:   10/03/14 1348  BP: 118/68  Pulse: 102  Temp: 97.9 F (36.6 C)  TempSrc: Oral  Height: 5' 3" (1.6 m)  Weight: 81 lb (36.741 kg)  SpO2: 100%   Body mass index is 14.35 kg/(m^2).  General: A&O x 3, WD, thin  Pulmonary: Sym exp, good air movt, CTAB, no rales, rhonchi, & wheezing  Cardiac: RRR, Nl S1, S2, no Murmurs, rubs or gallops  Vascular: Vessel Right Left  Radial Palpable Faintly Palpable  Ulnar Palpable Not Palpable  Brachial Palpable Palpable   Gastrointestinal: soft, NTND, no G/R, bo HSM, no masses, no CVAT B  Musculoskeletal: M/S 5/5 throughout , Extremities without  ischemic changes ,  palpable thrill in access in L UA, + bruit in access  Neurologic: Pain and light touch intact in extremities , Motor exam as listed above   Medical Decision Making  Sanjna Porro is a 69 y.o. female who presents with ESRD requiring hemodialysis. Reported L proximal cephalic vein stenosis   No images are available, so I can't determine if she really has a L proximal vein stenosis.  I discussed with the patient doing a L arm fistulogram and immediately proceeding into a cephalic vein turndown if the procedure is indicated.  Risk, benefits, and alternatives to access surgery were discussed.  The patient is aware the risks include but are not limited to: bleeding, infection, steal syndrome, nerve damage, ischemic monomelic neuropathy, failure to mature, need for additional procedures, death and stroke.    I discussed with the patient the nature of angiographic  procedures, especially the limited patencies of any endovascular intervention.  The patient is aware of that the risks of an angiographic procedure include but are not limited to: bleeding, infection, access site complications, renal failure, embolization, rupture of vessel, dissection, possible need for emergent surgical intervention, possible need for surgical procedures to treat the patient's pathology, anaphylactic reaction to contrast, and stroke and death.    The patient is aware of the risks and agrees to proceed.  She is scheduled for 17 AUG 16.  Lucan Riner, MD [facility] Office: [phone] Pager: [phone]  10/03/2014, 2:58 PM   

## 2014-10-06 ENCOUNTER — Encounter (HOSPITAL_COMMUNITY): Payer: Self-pay | Admitting: *Deleted

## 2014-10-06 DIAGNOSIS — L89153 Pressure ulcer of sacral region, stage 3: Secondary | ICD-10-CM | POA: Diagnosis not present

## 2014-10-06 DIAGNOSIS — E11621 Type 2 diabetes mellitus with foot ulcer: Secondary | ICD-10-CM | POA: Diagnosis not present

## 2014-10-06 DIAGNOSIS — L97511 Non-pressure chronic ulcer of other part of right foot limited to breakdown of skin: Secondary | ICD-10-CM | POA: Diagnosis not present

## 2014-10-06 DIAGNOSIS — E1151 Type 2 diabetes mellitus with diabetic peripheral angiopathy without gangrene: Secondary | ICD-10-CM | POA: Diagnosis not present

## 2014-10-06 NOTE — Progress Notes (Signed)
I called patients number prior to seeing that she is at [facility], she returned my call and we updated history.  I called [facility] and spoke with DONToniann Fail, she will fax [facility] to [facility] pharmacy.  I will call [facility] on  10/07/14 to speak with patients nurse and give instructions.

## 2014-10-07 MED ORDER — DEXTROSE 5 % IV SOLN
1.5000 g | INTRAVENOUS | Status: AC
  Administered 2014-10-08: 1.5 g via INTRAVENOUS
  Filled 2014-10-07: qty 1.5

## 2014-10-07 MED ORDER — CHLORHEXIDINE GLUCONATE CLOTH 2 % EX PADS: 6.0000 | MEDICATED_PAD | Freq: Once | CUTANEOUS | Status: DC

## 2014-10-07 MED ORDER — SODIUM CHLORIDE 0.9 % IV SOLN
INTRAVENOUS | Status: DC
  Administered 2014-10-08 (×2): via INTRAVENOUS

## 2014-10-08 ENCOUNTER — Telehealth: Payer: Self-pay

## 2014-10-08 ENCOUNTER — Encounter (HOSPITAL_COMMUNITY): Payer: Self-pay | Admitting: *Deleted

## 2014-10-08 ENCOUNTER — Ambulatory Visit (HOSPITAL_COMMUNITY): Payer: Medicare Other | Admitting: Anesthesiology

## 2014-10-08 ENCOUNTER — Ambulatory Visit (HOSPITAL_COMMUNITY)
Admission: RE | Admit: 2014-10-08 | Discharge: 2014-10-08 | Disposition: A | Discharge status: Skilled Nursing Facility | Payer: Medicare Other | Source: Ambulatory Visit | Attending: Vascular Surgery | Admitting: Vascular Surgery

## 2014-10-08 ENCOUNTER — Encounter (HOSPITAL_COMMUNITY)
Admission: RE | Disposition: A | Discharge status: Skilled Nursing Facility | Payer: Self-pay | Source: Ambulatory Visit | Attending: Vascular Surgery

## 2014-10-08 ENCOUNTER — Other Ambulatory Visit: Payer: Self-pay | Admitting: *Deleted

## 2014-10-08 ENCOUNTER — Telehealth: Payer: Self-pay | Admitting: Vascular Surgery

## 2014-10-08 DIAGNOSIS — I871 Compression of vein: Secondary | ICD-10-CM | POA: Diagnosis not present

## 2014-10-08 DIAGNOSIS — N186 End stage renal disease: Secondary | ICD-10-CM

## 2014-10-08 DIAGNOSIS — Z4931 Encounter for adequacy testing for hemodialysis: Secondary | ICD-10-CM

## 2014-10-08 DIAGNOSIS — E1140 Type 2 diabetes mellitus with diabetic neuropathy, unspecified: Secondary | ICD-10-CM | POA: Diagnosis not present

## 2014-10-08 DIAGNOSIS — I120 Hypertensive chronic kidney disease with stage 5 chronic kidney disease or end stage renal disease: Secondary | ICD-10-CM | POA: Diagnosis not present

## 2014-10-08 DIAGNOSIS — T82510A Breakdown (mechanical) of surgically created arteriovenous fistula, initial encounter: Secondary | ICD-10-CM | POA: Insufficient documentation

## 2014-10-08 DIAGNOSIS — I2510 Atherosclerotic heart disease of native coronary artery without angina pectoris: Secondary | ICD-10-CM | POA: Diagnosis not present

## 2014-10-08 DIAGNOSIS — Z89422 Acquired absence of other left toe(s): Secondary | ICD-10-CM | POA: Diagnosis not present

## 2014-10-08 DIAGNOSIS — Z7982 Long term (current) use of aspirin: Secondary | ICD-10-CM | POA: Insufficient documentation

## 2014-10-08 DIAGNOSIS — Z794 Long term (current) use of insulin: Secondary | ICD-10-CM | POA: Insufficient documentation

## 2014-10-08 DIAGNOSIS — Z992 Dependence on renal dialysis: Secondary | ICD-10-CM | POA: Insufficient documentation

## 2014-10-08 DIAGNOSIS — Z79899 Other long term (current) drug therapy: Secondary | ICD-10-CM | POA: Insufficient documentation

## 2014-10-08 DIAGNOSIS — Z792 Long term (current) use of antibiotics: Secondary | ICD-10-CM | POA: Insufficient documentation

## 2014-10-08 DIAGNOSIS — T82898A Other specified complication of vascular prosthetic devices, implants and grafts, initial encounter: Secondary | ICD-10-CM | POA: Diagnosis not present

## 2014-10-08 DIAGNOSIS — E1122 Type 2 diabetes mellitus with diabetic chronic kidney disease: Secondary | ICD-10-CM | POA: Insufficient documentation

## 2014-10-08 DIAGNOSIS — M109 Gout, unspecified: Secondary | ICD-10-CM | POA: Diagnosis not present

## 2014-10-08 DIAGNOSIS — E1151 Type 2 diabetes mellitus with diabetic peripheral angiopathy without gangrene: Secondary | ICD-10-CM | POA: Diagnosis not present

## 2014-10-08 DIAGNOSIS — Z89512 Acquired absence of left leg below knee: Secondary | ICD-10-CM | POA: Diagnosis not present

## 2014-10-08 DIAGNOSIS — Z8673 Personal history of transient ischemic attack (TIA), and cerebral infarction without residual deficits: Secondary | ICD-10-CM | POA: Insufficient documentation

## 2014-10-08 DIAGNOSIS — N319 Neuromuscular dysfunction of bladder, unspecified: Secondary | ICD-10-CM | POA: Insufficient documentation

## 2014-10-08 HISTORY — PX: VENOPLASTY: SHX6195

## 2014-10-08 HISTORY — PX: FISTULOGRAM: SHX5832

## 2014-10-08 LAB — GLUCOSE, CAPILLARY
GLUCOSE-CAPILLARY: 126 mg/dL — AB (ref 65–99)
Glucose-Capillary: 135 mg/dL — ABNORMAL HIGH (ref 65–99)
Glucose-Capillary: 119 mg/dL — ABNORMAL HIGH (ref 65–99)

## 2014-10-08 LAB — POCT I-STAT 4, (NA,K, GLUC, HGB,HCT)
GLUCOSE: 152 mg/dL — AB (ref 65–99)
HCT: 39 % (ref 36.0–46.0)
Hemoglobin: 13.3 g/dL (ref 12.0–15.0)
Potassium: 4.7 mmol/L (ref 3.5–5.1)
Sodium: 132 mmol/L — ABNORMAL LOW (ref 135–145)

## 2014-10-08 SURGERY — FISTULOGRAM
Anesthesia: Monitor Anesthesia Care | Site: Arm Upper | Laterality: Left

## 2014-10-08 MED ORDER — STERILE WATER FOR INJECTION IJ SOLN
INTRAMUSCULAR | Status: AC
  Filled 2014-10-08: qty 10

## 2014-10-08 MED ORDER — SODIUM CHLORIDE 0.9 % IJ SOLN
INTRAMUSCULAR | Status: AC
  Filled 2014-10-08: qty 10

## 2014-10-08 MED ORDER — HEPARIN SODIUM (PORCINE) 1000 UNIT/ML IJ SOLN
INTRAMUSCULAR | Status: AC
  Filled 2014-10-08: qty 3

## 2014-10-08 MED ORDER — MIDAZOLAM HCL 5 MG/5ML IJ SOLN
INTRAMUSCULAR | Status: DC | PRN
  Administered 2014-10-08: 2 mg via INTRAVENOUS

## 2014-10-08 MED ORDER — ONDANSETRON HCL 4 MG/2ML IJ SOLN: 4.0000 mg | Freq: Once | INTRAMUSCULAR | Status: DC | PRN

## 2014-10-08 MED ORDER — NEOSTIGMINE METHYLSULFATE 10 MG/10ML IV SOLN
INTRAVENOUS | Status: AC
  Filled 2014-10-08: qty 1

## 2014-10-08 MED ORDER — PHENYLEPHRINE HCL 10 MG/ML IJ SOLN
INTRAMUSCULAR | Status: DC | PRN
  Administered 2014-10-08 (×4): 80 ug via INTRAVENOUS

## 2014-10-08 MED ORDER — HEPARIN SODIUM (PORCINE) 1000 UNIT/ML IJ SOLN
INTRAMUSCULAR | Status: AC
  Filled 2014-10-08: qty 1

## 2014-10-08 MED ORDER — FENTANYL CITRATE (PF) 250 MCG/5ML IJ SOLN
INTRAMUSCULAR | Status: AC
  Filled 2014-10-08: qty 5

## 2014-10-08 MED ORDER — GLYCOPYRROLATE 0.2 MG/ML IJ SOLN
INTRAMUSCULAR | Status: AC
  Filled 2014-10-08: qty 4

## 2014-10-08 MED ORDER — HEPARIN SODIUM (PORCINE) 5000 UNIT/ML IJ SOLN
INTRAMUSCULAR | Status: DC | PRN
  Administered 2014-10-08: 500 mL

## 2014-10-08 MED ORDER — IODIXANOL 320 MG/ML IV SOLN
INTRAVENOUS | Status: DC | PRN
  Administered 2014-10-08: 45 mL via INTRAVENOUS

## 2014-10-08 MED ORDER — HEPARIN SODIUM (PORCINE) 1000 UNIT/ML IJ SOLN
INTRAMUSCULAR | Status: DC | PRN
  Administered 2014-10-08: 3000 [IU] via INTRAVENOUS

## 2014-10-08 MED ORDER — PROPOFOL 10 MG/ML IV BOLUS
INTRAVENOUS | Status: AC
  Filled 2014-10-08: qty 20

## 2014-10-08 MED ORDER — ONDANSETRON HCL 4 MG/2ML IJ SOLN
INTRAMUSCULAR | Status: AC
  Filled 2014-10-08: qty 4

## 2014-10-08 MED ORDER — PROTAMINE SULFATE 10 MG/ML IV SOLN
INTRAVENOUS | Status: AC
  Filled 2014-10-08: qty 5

## 2014-10-08 MED ORDER — 0.9 % SODIUM CHLORIDE (POUR BTL) OPTIME
TOPICAL | Status: DC | PRN
  Administered 2014-10-08: 1000 mL

## 2014-10-08 MED ORDER — ROCURONIUM BROMIDE 50 MG/5ML IV SOLN
INTRAVENOUS | Status: AC
Start: 2014-10-08 — End: 2014-10-08
  Filled 2014-10-08: qty 1

## 2014-10-08 MED ORDER — SUCCINYLCHOLINE CHLORIDE 20 MG/ML IJ SOLN
INTRAMUSCULAR | Status: AC
  Filled 2014-10-08: qty 1

## 2014-10-08 MED ORDER — MIDAZOLAM HCL 2 MG/2ML IJ SOLN
INTRAMUSCULAR | Status: AC
  Filled 2014-10-08: qty 4

## 2014-10-08 MED ORDER — PROPOFOL 10 MG/ML IV BOLUS
INTRAVENOUS | Status: DC | PRN
  Administered 2014-10-08: 100 mg via INTRAVENOUS

## 2014-10-08 MED ORDER — LIDOCAINE HCL (CARDIAC) 20 MG/ML IV SOLN
INTRAVENOUS | Status: AC
  Filled 2014-10-08: qty 10

## 2014-10-08 MED ORDER — EPHEDRINE SULFATE 50 MG/ML IJ SOLN
INTRAMUSCULAR | Status: DC | PRN
  Administered 2014-10-08: 5 mg via INTRAVENOUS

## 2014-10-08 MED ORDER — EPHEDRINE SULFATE 50 MG/ML IJ SOLN
INTRAMUSCULAR | Status: AC
  Filled 2014-10-08: qty 1

## 2014-10-08 MED ORDER — LIDOCAINE HCL (CARDIAC) 20 MG/ML IV SOLN
INTRAVENOUS | Status: DC | PRN
  Administered 2014-10-08: 40 mg via INTRAVENOUS

## 2014-10-08 MED ORDER — FENTANYL CITRATE (PF) 100 MCG/2ML IJ SOLN: 25.0000 ug | INTRAMUSCULAR | Status: DC | PRN

## 2014-10-08 SURGICAL SUPPLY — 55 items
BAG BANDED W/RUBBER/TAPE 36X54 (MISCELLANEOUS) ×4 IMPLANT
BALLN MUSTANG 6X60X75 (BALLOONS) ×4
BALLN MUSTANG 8X60X75 (BALLOONS) ×4
BALLOON MUSTANG 6X60X75 (BALLOONS) ×2 IMPLANT
BALLOON MUSTANG 8X60X75 (BALLOONS) ×2 IMPLANT
CANISTER SUCTION 2500CC (MISCELLANEOUS) ×4 IMPLANT
CLIP TI MEDIUM 6 (CLIP) ×4 IMPLANT
CLIP TI WIDE RED SMALL 6 (CLIP) ×4 IMPLANT
COVER DOME SNAP 22 D (MISCELLANEOUS) ×4 IMPLANT
COVER PROBE W GEL 5X96 (DRAPES) ×4 IMPLANT
DECANTER SPIKE VIAL GLASS SM (MISCELLANEOUS) IMPLANT
DRAIN PENROSE 1/2X12 LTX STRL (WOUND CARE) IMPLANT
DRSG TEGADERM 2-3/8X2-3/4 SM (GAUZE/BANDAGES/DRESSINGS) ×4 IMPLANT
DRSG TEGADERM 4X4.75 (GAUZE/BANDAGES/DRESSINGS) ×4 IMPLANT
ELECT REM PT RETURN 9FT ADLT (ELECTROSURGICAL) ×4
ELECTRODE REM PT RTRN 9FT ADLT (ELECTROSURGICAL) ×2 IMPLANT
GAUZE SPONGE 2X2 8PLY STRL LF (GAUZE/BANDAGES/DRESSINGS) ×2 IMPLANT
GEL ULTRASOUND 20GR AQUASONIC (MISCELLANEOUS) IMPLANT
GLOVE BIO SURGEON STRL SZ7 (GLOVE) ×8 IMPLANT
GLOVE BIOGEL PI IND STRL 6.5 (GLOVE) ×2 IMPLANT
GLOVE BIOGEL PI IND STRL 7.5 (GLOVE) ×4 IMPLANT
GLOVE BIOGEL PI INDICATOR 6.5 (GLOVE) ×2
GLOVE BIOGEL PI INDICATOR 7.5 (GLOVE) ×4
GLOVE SS BIOGEL STRL SZ 6.5 (GLOVE) ×2 IMPLANT
GLOVE SUPERSENSE BIOGEL SZ 6.5 (GLOVE) ×2
GOWN STRL REUS W/ TWL LRG LVL3 (GOWN DISPOSABLE) ×4 IMPLANT
GOWN STRL REUS W/TWL LRG LVL3 (GOWN DISPOSABLE) ×4
INTRODUCER COOK 11FR (CATHETERS) IMPLANT
INTRODUCER SET COOK 14FR (MISCELLANEOUS) IMPLANT
KIT BASIN OR (CUSTOM PROCEDURE TRAY) ×4 IMPLANT
KIT ENCORE 26 ADVANTAGE (KITS) ×4 IMPLANT
KIT ROOM TURNOVER OR (KITS) ×4 IMPLANT
LIQUID BAND (GAUZE/BANDAGES/DRESSINGS) IMPLANT
NEEDLE PERC 18GX7CM (NEEDLE) IMPLANT
NS IRRIG 1000ML POUR BTL (IV SOLUTION) ×4 IMPLANT
PACK CV ACCESS (CUSTOM PROCEDURE TRAY) ×4 IMPLANT
PAD ARMBOARD 7.5X6 YLW CONV (MISCELLANEOUS) ×8 IMPLANT
SET INTRODUCER 12FR PACEMAKER (SHEATH) IMPLANT
SET MICROPUNCTURE 5F STIFF (MISCELLANEOUS) ×4 IMPLANT
SHEATH AVANTI 11CM 5FR (MISCELLANEOUS) ×4 IMPLANT
SHEATH PINNACLE R/O II 6F 4CM (SHEATH) ×4 IMPLANT
SPONGE GAUZE 2X2 STER 10/PKG (GAUZE/BANDAGES/DRESSINGS) ×2
SPONGE SURGIFOAM ABS GEL 100 (HEMOSTASIS) IMPLANT
STOPCOCK MORSE 400PSI 3WAY (MISCELLANEOUS) ×4 IMPLANT
SUT MNCRL AB 4-0 PS2 18 (SUTURE) ×4 IMPLANT
SUT PROLENE 6 0 BV (SUTURE) IMPLANT
SUT PROLENE 7 0 BV 1 (SUTURE) IMPLANT
SUT SILK 2 0 FS (SUTURE) IMPLANT
SUT VIC AB 3-0 SH 27 (SUTURE)
SUT VIC AB 3-0 SH 27X BRD (SUTURE) IMPLANT
SYR 20CC LL (SYRINGE) ×4 IMPLANT
TUBING CIL FLEX 10 FLL-RA (TUBING) ×4 IMPLANT
UNDERPAD 30X30 INCONTINENT (UNDERPADS AND DIAPERS) ×4 IMPLANT
WATER STERILE IRR 1000ML POUR (IV SOLUTION) IMPLANT
WIRE BENTSON .035X145CM (WIRE) ×4 IMPLANT

## 2014-10-08 NOTE — Anesthesia Procedure Notes (Signed)
Procedure Name: LMA Insertion Date/Time: 10/08/2014 2:18 PM Performed by: Carmela Rima Pre-anesthesia Checklist: Timeout performed, Patient being monitored, Suction available, Emergency Drugs available and Patient identified Patient Re-evaluated:Patient Re-evaluated prior to inductionOxygen Delivery Method: Circle system utilized Preoxygenation: Pre-oxygenation with 100% oxygen Intubation Type: IV induction Ventilation: Mask ventilation without difficulty LMA: LMA inserted LMA Size: 3.0 Placement Confirmation: positive ETCO2 Dental Injury: Teeth and Oropharynx as per pre-operative assessment

## 2014-10-08 NOTE — Op Note (Signed)
OPERATIVE NOTE   PROCEDURE: 1.  left brachiocephalic arteriovenous fistula cannulation under ultrasound guidance 2.  left arm fistulogram 3.  Venoplasty of cephalic vein x 2 (6 mm x 60 mm, 8 mm x 60 mm)  PRE-OPERATIVE DIAGNOSIS: Malfunctioning left brachiocephalic arteriovenous fistula  POST-OPERATIVE DIAGNOSIS: same as above   SURGEON: Adele Barthel, MD  ANESTHESIA: local  ESTIMATED BLOOD LOSS: 5 cc  FINDING(S): 1. Patent fistula with two cephalic vein stenoses 43-88% proximally: resolved after serial venoplasty 2. Distal perianastomotic stenosis 50-75%: within 1 cm of cannulation site 3. Patent left subclavian vein, left innominate vein, and superior vena cava  4. Occluded distal axillary vein: no adequate distal vein for turndown  SPECIMEN(S):  None  CONTRAST: 30 cc  INDICATIONS: Elizabeth [name] is a 69 y.o. female who presents with malfunctioning left brachiocephalic arteriovenous fistula.  The Interventional Nephrology recommended evaluation for a cephalic vein turndown.  His images were not available, so I recommended to the patient we proceed with left arm fistulogram, possible cephalic vein turndown.  The patient is aware the risks include but are not limited to: bleeding, infection, thrombosis of the cannulated access, and possible anaphylactic reaction to the contrast.  The patient is aware of the risks of the procedure and elects to proceed forward.  DESCRIPTION: After full informed written consent was obtained, the patient was brought back to the angiography suite and placed supine upon the angiography table.  The patient was connected to monitoring equipment.  The left arm was prepped and draped in the standard fashion for a percutaneous access intervention.  Under ultrasound guidance, the left brachiocephalic arteriovenous fistula was cannulated with a micropuncture needle.  The microwire was advanced into the fistula and the needle was exchanged for the a  microsheath, which was lodged 2 cm into the access.  The wire was removed and the sheath was connected to the IV extension tubing.  Hand injections were completed to image the access from the antecubitum up to the level of axilla.  The central venous structures were also imaged by hand injections.  Based on the images, this patient will need: serial venoplasty of proximal cephalic vein.  The patient does NOT have an distal axillary vein for a cephalic vein turndown.  A Benson wire was advanced into the subclavian vein, crossing the two serial stenoses, and the sheath was exchanged for a short 6-Fr sheath.  Based on the imaging, a 6 mm x 60 mm angioplasty balloon was selected.  The balloon was centered around the two stenoses and inflated to 18 ATM for 4 minutes.  On completion imaging, ~30% residual stenosis was present.  At this point, the balloon was exchanged for a 8 mm x 60 mm angioplasty balloon.  The balloon was centered around the stenoses and inflated to 12 ATM for 2 minutes.  I deflated the balloon to 4 atm for 2 minutes.  I did a reflux injection which demonstrated a swing segment stenosis unfortunately within 1 cm of the cannulation site.  I did a completion injection which demonstrated: minimal residual stenosis in proximal cephalic vein.  Based on the completion imaging, no further intervention was possible.  The thrill in the fistula was improved.  After the cannulation site has healed, the patient can be evaluated for revision of the fistula if the inflow continues to be inadequate.  It has been my experience that immediately trying to revise the distal fistula adjacent to the cannulation site frequently results in uncontrolled bleeding.  The wire and balloon were removed from the sheath.  A 4-0 Monocryl purse-string suture was sewn around the sheath.  The sheath was removed while tying down the suture.  A sterile bandage was applied to the puncture site.   COMPLICATIONS: none  CONDITION:  stable   Adele Barthel, MD [facility] Office: [phone] Pager: [phone]  10/08/2014 3:20 PM

## 2014-10-08 NOTE — Telephone Encounter (Signed)
Pt is currently residing at [facility] and is under the care of Synthia Innocent, NP.  Per note in chart, pt is on Coreg 6.25 mg twice daily for CHF, however, the same was not noted on medication list.  Therefore, Synthia Innocent, NP was called to verify that patient was indeed on this medication.   Gavin Pound confirmed that patient is on Coreg 6.25 mg twice daily for CHF.  Pt's medication list was updated to reflect the same.

## 2014-10-08 NOTE — Transfer of Care (Signed)
Immediate Anesthesia Transfer of Care Note  Patient: Elizabeth [name]  Procedure(s) Performed: Procedure(s): LEFT ARM FISTULOGRAM (Left) LEFT CEPHALIC VEIN VENOPLASTY (Left)  Patient Location: PACU  Anesthesia Type:General  Level of Consciousness: sedated, patient cooperative and responds to stimulation  Airway & Oxygen Therapy: Patient Spontanous Breathing and Patient connected to face mask oxygen  Post-op Assessment: Report given to RN, Post -op Vital signs reviewed and stable, Patient moving all extremities X 4 and Patient able to stick tongue midline  Post vital signs: stable  Last Vitals:  Filed Vitals:   10/08/14 1114  BP: 110/69  Pulse: 104  Temp: 36.9 C  Resp: 18    Complications: No apparent anesthesia complications

## 2014-10-08 NOTE — Anesthesia Preprocedure Evaluation (Addendum)
Anesthesia Evaluation  Patient identified by MRN, date of birth, ID band Patient awake    Reviewed: Allergy & Precautions, H&P , NPO status , Patient's Chart, lab work & pertinent test results  History of Anesthesia Complications Negative for: history of anesthetic complications  Airway Mallampati: II  TM Distance: >3 FB Neck ROM: full    Dental no notable dental hx. (+) Missing, Poor Dentition, Partial Upper, Partial Lower, Dental Advisory Given, Dental Advidsory Given   Pulmonary neg pulmonary ROS,  breath sounds clear to auscultation  Pulmonary exam normal       Cardiovascular hypertension, Pt. on medications + CAD and + Peripheral Vascular Disease Normal cardiovascular examRhythm:regular Rate:Normal     Neuro/Psych negative neurological ROS     GI/Hepatic negative GI ROS, Neg liver ROS,   Endo/Other  diabetes, Type 2, Insulin Dependent  Renal/GU ESRF and DialysisRenal disease     Musculoskeletal   Abdominal   Peds  Hematology  (+) anemia ,   Anesthesia Other Findings Pulmonary at baseline, denies cardiac symptoms, POC labs today are wnml  Reproductive/Obstetrics negative OB ROS                           Anesthesia Physical  Anesthesia Plan  ASA: III  Anesthesia Plan: General and MAC   Post-op Pain Management:    Induction: Intravenous  Airway Management Planned: LMA  Additional Equipment:   Intra-op Plan:   Post-operative Plan: Extubation in OR  Informed Consent: I have reviewed the patients History and Physical, chart, labs and discussed the procedure including the risks, benefits and alternatives for the proposed anesthesia with the patient or authorized representative who has indicated his/her understanding and acceptance.   Dental Advisory Given  Plan Discussed with: Anesthesiologist, CRNA and Surgeon  Anesthesia Plan Comments: (LMA vs MAC depending surgical intent  and length of procedure)      Anesthesia Quick Evaluation

## 2014-10-08 NOTE — Anesthesia Postprocedure Evaluation (Signed)
  Anesthesia Post-op Note  Patient: Elizabeth [name]  Procedure(s) Performed: Procedure(s) (LRB): LEFT ARM FISTULOGRAM (Left) LEFT CEPHALIC VEIN VENOPLASTY (Left)  Patient Location: PACU  Anesthesia Type: General  Level of Consciousness: awake and alert   Airway and Oxygen Therapy: Patient Spontanous Breathing  Post-op Pain: mild  Post-op Assessment: Post-op Vital signs reviewed, Patient's Cardiovascular Status Stable, Respiratory Function Stable, Patent Airway and No signs of Nausea or vomiting  Last Vitals:  Filed Vitals:   10/08/14 1530  BP: 121/61  Pulse: 98  Temp: 36.6 C  Resp: 18    Post-op Vital Signs: stable   Complications: No apparent anesthesia complications

## 2014-10-08 NOTE — H&P (View-Only) (Signed)
Established Dialysis Access  History of Present Illness  Elizabeth [name] is a 69 y.o. ([birth year]) female who presents for re-evaluation of L BC AVF.  Pt is s/p L BC AVF 04/28/14 done by myself.  Reportedly Dr. Juel Burrow did a L arm fistulogram which demonstrated proximal cephalic vein stenosis.  He sent her for evaluation for a cephalic vein turndown.  No steal sx to date.  Pt reportedly dialyzing via L BC AVF.  Past Medical History  Diagnosis Date  . Coronary artery disease, non-occlusive January 2016    Moderate p&mLAD & Ostial OM; Myoview Nuclear Stress Test 03/21/2014: EF 35%. Mild hypokinesis of the inferior wall with fixed inferior defect suggesting scar. (no culprit lesion on CATH)  . 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 Critical lower limb ischemia     Status post left BKA following left popliteal and peroneal artery endarterectomy with patch angioplasty.  . Diabetes mellitus, insulin dependent (IDDM), uncontrolled     Brittle; has h/o Hypo & Hyperglycemic episodes  . Diabetic neuropathy   . Essential hypertension   . Hyperlipidemia with target LDL less than 70   . History of CVA (cerebrovascular accident) 08/19/2013    "minor one"  . ESRD (end stage renal disease)     [address], [place], [place] (06/12/2014)  . Urinary retention     DX NEUROGENIC BLADDER--HAS INDWELLING FOLEY CATHETER  . Foley catheter in place     for urinary retention  . Anemia   . Hematemesis April 2016    Hospitalized  . Arthritis of knee, right     "right knee" (06/12/2014)  . Gout     "was in my LLE"    Past Surgical History  Procedure Laterality Date  . Insertion of suprapubic catheter N/A 05/16/2012    Procedure: INSERTION OF SUPRAPUBIC CATHETER;  Surgeon: Sebastian Ache, MD;  Location: [facility] ORS;  Service: Urology;   Laterality: N/A;  . Bascilic vein transposition Left 10/30/2012    Procedure: LEFT 1ST STAGE BASCILIC VEIN TRANSPOSITION;  Surgeon: Chuck Hint, MD;  Location: [facility] OR;  Service: Vascular;  Laterality: Left;  . Nm myocar perf wall motion  04/19/2012/ 02/2014    a) low risk study-EF 44%,mild inferolateral hypkinesis; b) EF 35%. Mild hypokinesis of the inferior wall with fixed inferior defect suggesting scar.  . Bascilic vein transposition Left 02/05/2013    Procedure: BASCILIC VEIN TRANSPOSITION 2ND STAGE;  Surgeon: Chuck Hint, MD;  Location: [facility] OR;  Service: Vascular;  Laterality: Left;  . Radiology with anesthesia N/A 10/25/2013    Procedure: RADIOLOGY WITH ANESTHESIA;  Surgeon: Medication Radiologist, MD;  Location: [facility] OR;  Service: Radiology;  Laterality: N/A;  . Fistulogram Left 04/29/2013    Procedure: FISTULOGRAM;  Surgeon: Chuck Hint, MD;  Location: [facility] CATH LAB;  Service: Cardiovascular;  Laterality: Left;  . Angioplasty Left 04/29/2013    Procedure: ANGIOPLASTY;  Surgeon: Chuck Hint, MD;  Location: [facility] CATH LAB;  Service: Cardiovascular;  Laterality: Left;  . Abdominal aortagram N/A 03/19/2014    Procedure: ABDOMINAL Ronny Flurry;  Surgeon: Sherren Kerns, MD;  Location: [facility] CATH LAB;  Service: Cardiovascular;  Laterality: N/A;  . Left and right heart catheterization with coronary angiogram N/A 03/24/2014    Procedure: LEFT AND RIGHT HEART CATHETERIZATION WITH CORONARY ANGIOGRAM;  Surgeon: Marykay Lex, MD;  Location: [facility] CATH LAB;  Service: Cardiovascular; Moderate disease in p-mLAD & Ostial OM1; normal right heart pressures. Wedge pressure 29 mmHg. --No lesion to explain inferior defect on Myoview and echocardiogram.   . Endarterectomy popliteal Left 03/26/2014    Procedure: Left popliteal and peroneal artery endarterectomy with vein patch angioplasty;  Surgeon: Sherren Kerns, MD;  Location: [facility] OR;  Service: Vascular;  Laterality: Left;  . Amputation Left  03/26/2014    Procedure: AMPUTATION SECOND LEFT TOE;  Surgeon: Sherren Kerns, MD;  Location: [facility] OR;  Service: Vascular;  Laterality: Left;  . Av fistula placement Left 04/28/2014    Procedure: CREATION OF LEFT BRACHIOCEPHALIC  ARTERIOVENOUS (AV) FISTULA CREATION;  Surgeon: Fransisco Hertz, MD;  Location: [facility] OR;  Service: Vascular;  Laterality: Left;  . Amputation Left 05/10/2014    Procedure: AMPUTATION BELOW KNEE;  Surgeon: Larina Earthly, MD;  Location: [facility] OR;  Service: Vascular;  Laterality: Left;  . Esophagogastroduodenoscopy N/A 06/13/2014    Procedure: ESOPHAGOGASTRODUODENOSCOPY (EGD);  Surgeon: Jeani Hawking, MD;  Location: [facility] ENDOSCOPY;  Service: Endoscopy;  Laterality: N/A;  . Transthoracic echocardiogram  03/22/2014    EF 30-35%, mild LVH. Severe inferior hypokinesis. Mild MR.    Social History   Social History  . Marital Status: Single    Spouse Name: N/A  . Number of Children: N/A  . Years of Education: N/A   Occupational History  . Not on file.   Social History Main Topics  . Smoking status: Never Smoker   . Smokeless tobacco: Never Used  . Alcohol Use: 0.0 oz/week    0 Standard drinks or equivalent per week     Comment: "stopped drinking in 2013 drank a couple beers/day, 2 days/wk"  . Drug Use: No  . Sexual Activity: No   Other Topics Concern  . Not on file   Social History Narrative    Family History  Problem Relation Age of Onset  . Hyperlipidemia Mother   . Hypertension Mother   . Hodgkin's lymphoma Father   . Heart disease Sister      Current Outpatient Prescriptions  Medication Sig Dispense Refill  . Amino Acids-Protein Hydrolys (FEEDING SUPPLEMENT, PRO-STAT SUGAR FREE 64,) LIQD Take 30 mLs by mouth 3 (three) times daily with meals.    Marland Kitchen aspirin 81 MG tablet Take 81 mg by mouth daily.    Marland Kitchen Besifloxacin HCl (BESIVANCE) 0.6 % SUSP Apply 1 drop to eye 3 (three) times daily.    . cetirizine (ZYRTEC) 10 MG tablet Take 10 mg by mouth as needed for allergies.      . Difluprednate (DUREZOL OP) Apply 1 drop to eye 3 (three) times daily.    . insulin aspart (NOVOLOG) 100 UNIT/ML injection Inject 0-9 Units into the skin 3 (three) times daily with meals. Sliding scale CBG 70 - 120: 0 units CBG 121 - 150: 1 unit,  CBG 151 - 200: 2 units,  CBG 201 - 250: 3 units,  CBG 251 - 300: 5 units,  CBG 301 - 350: 7 units,  CBG 351 - 400: 9 units   CBG > 400: 9 units and notify your MD (Patient taking differently: Inject 0-5 Units into the skin 3 (three) times daily with meals. Sliding Scale: 0 - 149 = 0 units, 150+ = 5 units) 10 mL 11  . insulin glargine (LANTUS) 100 UNIT/ML injection Inject 0.03 mLs (3 Units total) into the skin daily. (Patient taking differently: Inject 3 Units into the skin at  bedtime. ) 10 mL 11  . lidocaine-prilocaine (EMLA) cream Apply 1 application topically as needed.     . Multiple Vitamins-Minerals (DECUBI-VITE) CAPS Take 2 capsules by mouth daily.    Marland Kitchen NEPAFENAC OP Apply 1 drop to eye at bedtime and may repeat dose one time if needed.    . Nutritional Supplements (FEEDING SUPPLEMENT, NEPRO CARB STEADY,) LIQD Take 237 mLs by mouth 2 (two) times daily between meals.    . pantoprazole (PROTONIX) 40 MG tablet Take 1 tablet (40 mg total) by mouth daily. (Patient taking differently: Take 40 mg by mouth daily with breakfast. )    . pravastatin (PRAVACHOL) 20 MG tablet Take 20 mg by mouth every evening.     . promethazine (PHENERGAN) 25 MG tablet Take 25 mg by mouth every 6 (six) hours as needed for nausea or vomiting.    . saccharomyces boulardii (FLORASTOR) 250 MG capsule Take 1 capsule (250 mg total) by mouth 2 (two) times daily.    Marland Kitchen senna-docusate (SENOKOT-S) 8.6-50 MG per tablet Take 1 tablet by mouth 2 (two) times daily.    . sevelamer carbonate (RENVELA) 800 MG tablet Take 1 tablet (800 mg total) by mouth 3 (three) times daily with meals.    Marland Kitchen amoxicillin-clavulanate (AUGMENTIN) 875-125 MG per tablet Take 1 tablet by mouth 2 (two) times daily.    .  Darbepoetin Alfa (ARANESP) 200 MCG/0.4ML SOSY injection Inject 0.4 mLs (200 mcg total) into the vein every Thursday with hemodialysis. (Patient not taking: Reported on 10/03/2014) 1.68 mL   . docusate sodium (COLACE) 50 MG capsule Take 1 capsule (50 mg total) by mouth 2 (two) times daily. Hold for diarrhea (Patient not taking: Reported on 10/03/2014)    . doxercalciferol (HECTOROL) 4 MCG/2ML injection Inject 0.5 mLs (1 mcg total) into the vein Every Tuesday,Thursday,and Saturday with dialysis. (Patient not taking: Reported on 10/03/2014) 2 mL   . glucose blood (ONETOUCH VERIO) test strip Use as instructed to check blood sugar 4 times per day dx code E13.10 (Patient not taking: Reported on 10/03/2014) 150 each 3  . LANTUS SOLOSTAR 100 UNIT/ML Solostar Pen     . ONETOUCH DELICA LANCETS FINE MISC Use to check blood sugar 4 times per day (Patient not taking: Reported on 10/03/2014) 200 each 3  . oxyCODONE-acetaminophen (PERCOCET/ROXICET) 5-325 MG per tablet Take one tablet by mouth every 8 hours as needed for pain. Not to exceed 3gm APAP from all sources/24 hours (Patient not taking: Reported on 10/03/2014) 90 tablet 0   No current facility-administered medications for this visit.     No Known Allergies   REVIEW OF SYSTEMS:  (Positives checked otherwise negative)  CARDIOVASCULAR:    chest pain,   chest pressure,   palpitations,   shortness of breath when laying flat,   shortness of breath with exertion,    pain in feet when walking,   pain in feet when laying flat,  history of blood clot in veins (DVT),   history of phlebitis,   swelling in legs,   varicose veins  PULMONARY:    productive cough,   asthma,   wheezing  NEUROLOGIC:    weakness in arms or legs,   numbness in arms or legs,   difficulty speaking or slurred speech,   temporary loss of vision in one eye,   dizziness  HEMATOLOGIC:    bleeding problems,    problems  with blood clotting too easily  MUSCULOSKEL:   [ ]  joint pain, [ ]  joint swelling  GASTROINTEST:   [ ]  vomiting blood,  [ ]  blood in stool     GENITOURINARY:   [ ]  burning with urination,  [ ]  blood in urine [x]  ESRD-HD: T-R-S  PSYCHIATRIC:   [ ]  history of major depression  INTEGUMENTARY:   [ ]  rashes,  [ ]  ulcers  CONSTITUTIONAL:   [ ]  fever,  [ ]  chills   Physical Examination  Filed Vitals:   10/03/14 1348  BP: 118/68  Pulse: 102  Temp: 97.9 F (36.6 C)  TempSrc: Oral  Height: 5\' 3"  (1.6 m)  Weight: 81 lb (36.741 kg)  SpO2: 100%   Body mass index is 14.35 kg/(m^2).  General: A&O x 3, WD, thin  Pulmonary: Sym exp, good air movt, CTAB, no rales, rhonchi, & wheezing  Cardiac: RRR, Nl S1, S2, no Murmurs, rubs or gallops  Vascular: Vessel Right Left  Radial Palpable Faintly Palpable  Ulnar Palpable Not Palpable  Brachial Palpable Palpable   Gastrointestinal: soft, NTND, no G/R, bo HSM, no masses, no CVAT B  Musculoskeletal: M/S 5/5 throughout , Extremities without  ischemic changes ,  palpable thrill in access in L UA, + bruit in access  Neurologic: Pain and light touch intact in extremities , Motor exam as listed above   Medical Decision Making  Branisha Stanish is a 69 y.o. female who presents with ESRD requiring hemodialysis. Reported L proximal cephalic vein stenosis   No images are available, so I can't determine if she really has a L proximal vein stenosis.  I discussed with the patient doing a L arm fistulogram and immediately proceeding into a cephalic vein turndown if the procedure is indicated.  Risk, benefits, and alternatives to access surgery were discussed.  The patient is aware the risks include but are not limited to: bleeding, infection, steal syndrome, nerve damage, ischemic monomelic neuropathy, failure to mature, need for additional procedures, death and stroke.    I discussed with the patient the nature of angiographic  procedures, especially the limited patencies of any endovascular intervention.  The patient is aware of that the risks of an angiographic procedure include but are not limited to: bleeding, infection, access site complications, renal failure, embolization, rupture of vessel, dissection, possible need for emergent surgical intervention, possible need for surgical procedures to treat the patient's pathology, anaphylactic reaction to contrast, and stroke and death.    The patient is aware of the risks and agrees to proceed.  She is scheduled for 17 AUG 16.  Leonides Sake, MD [facility] Office: [phone] Pager: [phone]  10/03/2014, 2:58 PM

## 2014-10-08 NOTE — Telephone Encounter (Signed)
[facility] dropped off cd at 11:38 today.

## 2014-10-08 NOTE — Interval H&P Note (Signed)
[facility]  History and Physical Update  The patient was interviewed and re-examined.  The patient's previous History and Physical has been reviewed and is unchanged from my consult.  There is no change in the plan of care: L arm fistulogram, possible L cephalic vein turndown.  Leonides Sake, MD [facility] [place] Office: [phone] Pager: [phone]  10/08/2014, 11:12 AM

## 2014-10-09 ENCOUNTER — Ambulatory Visit: Payer: Medicare Other | Admitting: Endocrinology

## 2014-10-09 ENCOUNTER — Encounter (HOSPITAL_COMMUNITY): Payer: Self-pay | Admitting: Vascular Surgery

## 2014-10-13 ENCOUNTER — Encounter: Payer: Self-pay | Admitting: Endocrinology

## 2014-10-13 ENCOUNTER — Ambulatory Visit (INDEPENDENT_AMBULATORY_CARE_PROVIDER_SITE_OTHER): Payer: Medicare Other | Admitting: Endocrinology

## 2014-10-13 ENCOUNTER — Telehealth: Payer: Self-pay | Admitting: Vascular Surgery

## 2014-10-13 VITALS — BP 118/60 | HR 120 | Temp 98.1°F | Wt 84.1 lb

## 2014-10-13 DIAGNOSIS — E1065 Type 1 diabetes mellitus with hyperglycemia: Secondary | ICD-10-CM

## 2014-10-13 DIAGNOSIS — I2510 Atherosclerotic heart disease of native coronary artery without angina pectoris: Secondary | ICD-10-CM | POA: Diagnosis not present

## 2014-10-13 DIAGNOSIS — IMO0002 Reserved for concepts with insufficient information to code with codable children: Secondary | ICD-10-CM

## 2014-10-13 NOTE — Patient Instructions (Signed)
Instructions for nursing home:  ALWAYS have patient bring blood sugar records to the office.  Keep these in the diary that was given today.  LANTUS insulin: change the dosage to 6 units in the morning and 3 units at bedtime  NOVOLOG insulin: This must be given BEFORE eating not after Increase the dose to 8 units before supper time instead of 7, continue 3 units at breakfast NOVOLOG needs to be specified as 5 units BEFORE lunch

## 2014-10-13 NOTE — Progress Notes (Signed)
Patient ID: Elizabeth [name], female   DOB: [birth year], 69 y.o.   MRN: [id number]    Reason for Appointment: Follow-up for Diabetes  Referring physician: Lerry Liner  History of Present Illness:          Diagnosis: Type 2 diabetes mellitus, date of diagnosis: 1996        Past history:  She was initially treated with metformin and glyburide and her detailed history or level of control is not available She was switched to insulin probably in 2013 She had been taking a regimen of Lantus at night and NovoLog before meals prior to her initial consultation  Recent history:   INSULIN regimen is described as: Lantus 8 units at bedtime, NovoLog 3-5-7 units at meals  She currently in a rehabilitation nursing facility  On her last visit she was taking relatively small dose of Lantus compared to a previous dose and instructions were given to nursing home to change her Lantus to the morning However her nursing home has changed her directions to Lantus at bedtime again and the dose is now increased from 5 up to 8 units Her mealtime Novolog coverage also been adjusted with higher doses at suppertime  Her A1c tends to be lower than expected possibly because of her anemia and end-stage renal disease  Current blood sugar patterns and problems with management:  She is having her blood sugar checked very erratically at the nursing home and not consistently to 3 times a day  Again he does difficult to analyze her blood sugars as they are not organized by time of day; also appears to sometimes they are entered late in the day and not at the time it was taken  She again says she is not getting her Novolog before eating and frequently this is given about half an hour after eating   Fasting blood sugars: These are recently mostly fairly good without hypoglycemia, has  high readings also but not recently  Her HIGHEST blood sugars are after supper at night and mostly over 200 and sometimes  over 300  She has very few blood sugars being checked around supper time and only a few at lunchtime  No consistent pattern seen with her blood sugars at lunch or supper time  No hypoglycemia recently  Unclear what kind of diet she is getting   Glucose monitoring: 3-4 times a day Glucometer:  unknown  Blood sugars by nursing home record as discussed above: Glycemic control:    Lab Results  Component Value Date   HGBA1C 6.7* 06/26/2014   HGBA1C 6.7* 06/12/2014   HGBA1C 6.5* 06/12/2014   Lab Results  Component Value Date   LDLCALC 76 03/21/2014   CREATININE 5.49* 08/13/2014     Self-care: The diet that the patient has been following is: None       Meals: 3 meals per day. Breakfast is variable,  lunch is a sandwich and ginger ale. Mixed meal at dinner, snacks are sugar-free desserts, fruit or applesauce         Exercise: None           Dietician visit: Most recent: Several years ago.  consultation with CDE in 09/2013              Weight history:  Wt Readings from Last 3 Encounters:  10/13/14 84 lb 2 oz (38.159 kg)  10/08/14 81 lb (36.741 kg)  10/03/14 81 lb (36.741 kg)       Medication List  This list is accurate as of: 10/13/14 11:59 PM.  Always use your most recent med list.               aspirin EC 81 MG tablet  Take 81 mg by mouth daily.     BESIVANCE 0.6 % Susp  Generic drug:  Besifloxacin HCl  Place 1 drop into the right eye See admin instructions. Instill one drop into right eye 3 times daily for 3 days starting 10/06/14     carvedilol 6.25 MG tablet  Commonly known as:  COREG  Take 6.25 mg by mouth 2 (two) times daily with a meal.     cetirizine 10 MG tablet  Commonly known as:  ZYRTEC  Take 10 mg by mouth daily as needed for allergies.     Darbepoetin Alfa 200 MCG/0.4ML Sosy injection  Commonly known as:  ARANESP  Inject 0.4 mLs (200 mcg total) into the vein every Thursday with hemodialysis.     DECUBI-VITE Caps  Take 2 capsules by mouth  daily.     docusate sodium 50 MG capsule  Commonly known as:  COLACE  Take 1 capsule (50 mg total) by mouth 2 (two) times daily. Hold for diarrhea     doxercalciferol 4 MCG/2ML injection  Commonly known as:  HECTOROL  Inject 0.5 mLs (1 mcg total) into the vein Every Tuesday,Thursday,and Saturday with dialysis.     DUREZOL 0.05 % Emul  Generic drug:  Difluprednate  Place 1 drop into the right eye See admin instructions. Instill one drop into right eye 3 times daily on 10/08/14 (one day)     feeding supplement (NEPRO CARB STEADY) Liqd  Take 237 mLs by mouth 2 (two) times daily between meals.     feeding supplement (PRO-STAT SUGAR FREE 64) Liqd  Take 30 mLs by mouth 3 (three) times daily with meals.     glucose blood test strip  Commonly known as:  ONETOUCH VERIO  Use as instructed to check blood sugar 4 times per day dx code E13.10     insulin aspart 100 UNIT/ML injection  Commonly known as:  novoLOG  Inject 0-9 Units into the skin 3 (three) times daily with meals. Sliding scale CBG 70 - 120: 0 units CBG 121 - 150: 1 unit,  CBG 151 - 200: 2 units,  CBG 201 - 250: 3 units,  CBG 251 - 300: 5 units,  CBG 301 - 350: 7 units,  CBG 351 - 400: 9 units   CBG > 400: 9 units and notify your MD     insulin glargine 100 UNIT/ML injection  Commonly known as:  LANTUS  Inject 0.03 mLs (3 Units total) into the skin daily.     lidocaine-prilocaine cream  Commonly known as:  EMLA  Apply 1 application topically 3 (three) times a week. apply to dialysis site every Tuesday, Thursday, Saturday prior to dialysis     nepafenac 0.3 % ophthalmic suspension  Commonly known as:  ILEVRO  Place 1 drop into the right eye See admin instructions. Instill one drop into right eye at bedtime for 3 days starting 10/06/14     [facility] DELICA LANCETS FINE Misc  Use to check blood sugar 4 times per day     oxyCODONE-acetaminophen 5-325 MG per tablet  Commonly known as:  PERCOCET/ROXICET  Take one tablet by mouth  every 8 hours as needed for pain. Not to exceed 3gm APAP from all sources/24 hours     pantoprazole 40 MG  tablet  Commonly known as:  PROTONIX  Take 1 tablet (40 mg total) by mouth daily.     pravastatin 20 MG tablet  Commonly known as:  PRAVACHOL  Take 20 mg by mouth every evening.     promethazine 25 MG tablet  Commonly known as:  PHENERGAN  Take 25 mg by mouth every 6 (six) hours as needed for nausea or vomiting.     saccharomyces boulardii 250 MG capsule  Commonly known as:  FLORASTOR  Take 1 capsule (250 mg total) by mouth 2 (two) times daily.     senna-docusate 8.6-50 MG per tablet  Commonly known as:  Senokot-S  Take 1 tablet by mouth 2 (two) times daily.     sevelamer carbonate 800 MG tablet  Commonly known as:  RENVELA  Take 1 tablet (800 mg total) by mouth 3 (three) times daily with meals.        Allergies: No Known Allergies  Past Medical History  Diagnosis Date  . Coronary artery disease, non-occlusive January 2016    Moderate p&mLAD & Ostial OM; Myoview Nuclear Stress Test 03/21/2014: EF 35%. Mild hypokinesis of the inferior wall with fixed inferior defect suggesting scar. (no culprit lesion on CATH)  . 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 Critical lower limb ischemia     Status post left BKA following left popliteal and peroneal artery endarterectomy with patch angioplasty.  . Diabetic neuropathy   . Essential hypertension   . Hyperlipidemia with target LDL less than 70   . History of CVA (cerebrovascular accident) 08/19/2013    "minor one"  . ESRD (end stage renal disease)     [address], [place], [place] (06/12/2014)  . Urinary retention     DX NEUROGENIC BLADDER--HAS INDWELLING FOLEY CATHETER  . Foley catheter in place     for urinary retention  . Anemia   . Hematemesis April  2016    Hospitalized  . Arthritis of knee, right     "right knee" (06/12/2014)  . Gout     "was in my LLE"  . Diabetes mellitus, insulin dependent (IDDM), uncontrolled     Brittle; has h/o Hypo & Hyperglycemic episodes, Type II    Past Surgical History  Procedure Laterality Date  . Insertion of suprapubic catheter N/A 05/16/2012    Procedure: INSERTION OF SUPRAPUBIC CATHETER;  Surgeon: Sebastian Ache, MD;  Location: [facility] ORS;  Service: Urology;  Laterality: N/A;  . Bascilic vein transposition Left 10/30/2012    Procedure: LEFT 1ST STAGE BASCILIC VEIN TRANSPOSITION;  Surgeon: Chuck Hint, MD;  Location: [facility] OR;  Service: Vascular;  Laterality: Left;  . Nm myocar perf wall motion  04/19/2012/ 02/2014    a) low risk study-EF 44%,mild inferolateral hypkinesis; b) EF 35%. Mild hypokinesis of the inferior wall with fixed inferior defect suggesting scar.  . Bascilic vein transposition Left 02/05/2013    Procedure: BASCILIC VEIN TRANSPOSITION 2ND STAGE;  Surgeon: Chuck Hint, MD;  Location: [facility] OR;  Service: Vascular;  Laterality: Left;  . Radiology with anesthesia N/A 10/25/2013    Procedure: RADIOLOGY WITH ANESTHESIA;  Surgeon: Medication Radiologist, MD;  Location: [facility] OR;  Service: Radiology;  Laterality: N/A;  . Fistulogram Left 04/29/2013    Procedure: FISTULOGRAM;  Surgeon: Chuck Hint, MD;  Location: [facility] CATH LAB;  Service: Cardiovascular;  Laterality:  Left;  . Angioplasty Left 04/29/2013    Procedure: ANGIOPLASTY;  Surgeon: Chuck Hint, MD;  Location: [facility] CATH LAB;  Service: Cardiovascular;  Laterality: Left;  . Abdominal aortagram N/A 03/19/2014    Procedure: ABDOMINAL Ronny Flurry;  Surgeon: Sherren Kerns, MD;  Location: [facility] CATH LAB;  Service: Cardiovascular;  Laterality: N/A;  . Left and right heart catheterization with coronary angiogram N/A 03/24/2014    Procedure: LEFT AND RIGHT HEART CATHETERIZATION WITH CORONARY ANGIOGRAM;  Surgeon: Marykay Lex, MD;   Location: [facility] CATH LAB;  Service: Cardiovascular; Moderate disease in p-mLAD & Ostial OM1; normal right heart pressures. Wedge pressure 29 mmHg. --No lesion to explain inferior defect on Myoview and echocardiogram.   . Endarterectomy popliteal Left 03/26/2014    Procedure: Left popliteal and peroneal artery endarterectomy with vein patch angioplasty;  Surgeon: Sherren Kerns, MD;  Location: [facility] OR;  Service: Vascular;  Laterality: Left;  . Amputation Left 03/26/2014    Procedure: AMPUTATION SECOND LEFT TOE;  Surgeon: Sherren Kerns, MD;  Location: [facility] OR;  Service: Vascular;  Laterality: Left;  . Av fistula placement Left 04/28/2014    Procedure: CREATION OF LEFT BRACHIOCEPHALIC  ARTERIOVENOUS (AV) FISTULA CREATION;  Surgeon: Fransisco Hertz, MD;  Location: [facility] OR;  Service: Vascular;  Laterality: Left;  . Amputation Left 05/10/2014    Procedure: AMPUTATION BELOW KNEE;  Surgeon: Larina Earthly, MD;  Location: [facility] OR;  Service: Vascular;  Laterality: Left;  . Esophagogastroduodenoscopy N/A 06/13/2014    Procedure: ESOPHAGOGASTRODUODENOSCOPY (EGD);  Surgeon: Jeani Hawking, MD;  Location: [facility] ENDOSCOPY;  Service: Endoscopy;  Laterality: N/A;  . Transthoracic echocardiogram  03/22/2014    EF 30-35%, mild LVH. Severe inferior hypokinesis. Mild MR.  Marland Kitchen Below knee leg amputation Left   . Fistulogram Left 10/08/2014    Procedure: LEFT ARM FISTULOGRAM;  Surgeon: Fransisco Hertz, MD;  Location: [facility] OR;  Service: Vascular;  Laterality: Left;  . Venoplasty Left 10/08/2014    Procedure: LEFT CEPHALIC VEIN VENOPLASTY;  Surgeon: Fransisco Hertz, MD;  Location: [facility] OR;  Service: Vascular;  Laterality: Left;    Family History  Problem Relation Age of Onset  . Hyperlipidemia Mother   . Hypertension Mother   . Hodgkin's lymphoma Father   . Heart disease Sister     Social History:  reports that she has never smoked. She has never used smokeless tobacco. She reports that she does not drink alcohol or use illicit drugs.    Review of  Systems   Eyes: She has poor vision for unknown reasons       Lipids: . Taking pravastatin 20 mg       Lab Results  Component Value Date   CHOL 152 03/21/2014   HDL 54 03/21/2014   LDLCALC 76 03/21/2014   TRIG 109 03/21/2014   CHOLHDL 2.8 03/21/2014                  The blood pressure has been high previously for several years but currently only on low-dose carvedilol, followed by nephrologist     She has had end-stage kidney disease on dialysis    Previous foot exam had shown pulses are absent in the feet. Has mild decrease in monofilament sensation in her toes  She is asking for diabetic shoes but comes in a wheelchair.  She does have a previous diabetic shoe on her right foot.  She thinks this is worn out and she thinks she has a prosthesis and is  trying to walk to  Physical Examination:  BP 118/60 mmHg  Pulse 120  Temp(Src) 98.1 F (36.7 C) (Oral)  Wt 84 lb 2 oz (38.159 kg)  SpO2 98%     ASSESSMENT:  Diabetes type 2, insulin requiring, nonobese Her diabetes currently is behaving like a type I  See history of present illness for current management, problems identified in blood sugar patterns Difficult to assess her control as her blood sugar readings are not organized on her nursing home record and not all her readings appear to be recorded especially at lunch and suppertime Also records are not usually sent with the patient and have to be requested by fax causing delay in patient's evaluation in the office She thinks that she is not getting her Novolog before eating especially for her supper and this may be possibly causing higher readings However since blood sugars are better with Lantus at bedtime in the morning and higher in the evenings is unlikely that her Lantus is lasting 24 hours Also not clear if she is getting a consistent balanced diet Appears to be getting 7 units of Novolog at dinnertime but this is not appearing to be enough  A1c to be done on the next  visit  Hyperlipidemia:  controlled with pravastatin, will repeat levels on the next visit   PLAN:   Written instructions given in detail for nursing home as follows, will need to try taking her Lantus twice a day for better 24 hour control   especially around mealtimes  She will have instructions for checking her blood sugar and insulin doses at mealtimes to be given before eating  Will defer any diabetic shoes until she is walking regularly  Patient Instructions  Instructions for nursing home:  ALWAYS have patient bring blood sugar records to the office.  Keep these in the diary that was given today.  LANTUS insulin: change the dosage to 6 units in the morning and 3 units at bedtime  NOVOLOG insulin: This must be given BEFORE eating not after Increase the dose to 8 units before supper time instead of 7, continue 3 units at breakfast NOVOLOG needs to be specified as 5 units BEFORE lunch      Counseling time on subjects discussed above is over 50% of today's 25 minute visit   Breelle Hollywood 10/14/2014, 8:00 AM   Note: This office note was prepared with Insurance underwriter. Any transcriptional errors that result from this process are unintentional.

## 2014-10-13 NOTE — Progress Notes (Signed)
Pre visit review using our clinic review tool, if applicable. No additional management support is needed unless otherwise documented below in the visit note. [phone]

## 2014-10-13 NOTE — Telephone Encounter (Signed)
Spoke with Chip Boer at [facility], dpm

## 2014-10-13 NOTE — Telephone Encounter (Signed)
-----   Message from Sharee Pimple, RN sent at 10/08/2014  4:40 PM EDT ----- Regarding: Schedule   ----- Message -----    From: Fransisco Hertz, MD    Sent: 10/08/2014   3:30 PM      To: [facility] Charge [facility] [place] [phone] 18-Mar-1945  PROCEDURE: 1.  left brachiocephalic arteriovenous fistula cannulation under ultrasound guidance 2.  left arm fistulogram 3.  Venoplasty of cephalic vein x 2 (6 mm x 60 mm, 8 mm x 60 mm)   Follow-up: 4-6 weeks  Orders(s) for follow-up: L access duplex

## 2014-10-14 DIAGNOSIS — K5909 Other constipation: Secondary | ICD-10-CM | POA: Insufficient documentation

## 2014-10-14 NOTE — Progress Notes (Signed)
Patient ID: Elizabeth [name], female   DOB: [birth year], 69 y.o.   MRN: [id number]    Facility: [facility]      No Known Allergies  Chief Complaint  Patient presents with  . Medical Management of Chronic Issues    HPI:  She is a long term resident of this facility being seen for the management of her chronic illnesses. She is complaining of N/V for the past 3-4 days. Staff reports that she has had "runny" stools at times. She states that she cannot go to dialysis today as she is too sick.     Past Medical History  Diagnosis Date  . Coronary artery disease, non-occlusive January 2016    Moderate p&mLAD & Ostial OM; Myoview Nuclear Stress Test 03/21/2014: EF 35%. Mild hypokinesis of the inferior wall with fixed inferior defect suggesting scar. (no culprit lesion on CATH)  . 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 Critical lower limb ischemia     Status post left BKA following left popliteal and peroneal artery endarterectomy with patch angioplasty.  . Diabetic neuropathy   . Essential hypertension   . Hyperlipidemia with target LDL less than 70   . History of CVA (cerebrovascular accident) 08/19/2013    "minor one"  . ESRD (end stage renal disease)     [address], [place], [place] (06/12/2014)  . Urinary retention     DX NEUROGENIC BLADDER--HAS INDWELLING FOLEY CATHETER  . Foley catheter in place     for urinary retention  . Anemia   . Hematemesis April 2016    Hospitalized  . Arthritis of knee, right     "right knee" (06/12/2014)  . Gout     "was in my LLE"  . Diabetes mellitus, insulin dependent (IDDM), uncontrolled     Brittle; has h/o Hypo & Hyperglycemic episodes, Type II    Past Surgical History  Procedure Laterality Date  . Insertion of suprapubic catheter N/A 05/16/2012   Procedure: INSERTION OF SUPRAPUBIC CATHETER;  Surgeon: Sebastian Ache, MD;  Location: [facility] ORS;  Service: Urology;  Laterality: N/A;  . Bascilic vein transposition Left 10/30/2012    Procedure: LEFT 1ST STAGE BASCILIC VEIN TRANSPOSITION;  Surgeon: Chuck Hint, MD;  [facility] OR;  Service: Vascular;  Laterality: Left;  . Nm myocar perf wall motion  04/19/2012/ 02/2014    a) low risk study-EF 44%,mild inferolateral hypkinesis; b) EF 35%. Mild hypokinesis of the inferior wall with fixed inferior defect suggesting scar.  . Bascilic vein transposition Left 02/05/2013    Procedure: BASCILIC VEIN TRANSPOSITION 2ND STAGE;  Surgeon: Chuck Hint, MD;  Location: [facility] OR;  Service: Vascular;  Laterality: Left;  . Radiology with anesthesia N/A 10/25/2013    Procedure: RADIOLOGY WITH ANESTHESIA;  Surgeon: Medication Radiologist, MD;  Location: [facility] OR;  Service: Radiology;  Laterality: N/A;  . Fistulogram Left 04/29/2013    Procedure: FISTULOGRAM;  Surgeon: Chuck Hint, MD;  Location: [facility] CATH LAB;  Service: Cardiovascular;  Laterality: Left;  . Angioplasty Left 04/29/2013    Procedure: ANGIOPLASTY;  Surgeon: Chuck Hint, MD;  Location: [facility] CATH LAB;  Service: Cardiovascular;  Laterality: Left;  . Abdominal aortagram N/A 03/19/2014    Procedure: ABDOMINAL Ronny Flurry;  Surgeon: Sherren Kerns, MD;  Location: [facility] CATH LAB;  Service: Cardiovascular;  Laterality: N/A;  . Left and  right heart catheterization with coronary angiogram N/A 03/24/2014    Procedure: LEFT AND RIGHT HEART CATHETERIZATION WITH CORONARY ANGIOGRAM;  Surgeon: Marykay Lex, MD;  Location: [facility] CATH LAB;  Service: Cardiovascular; Moderate disease in p-mLAD & Ostial OM1; normal right heart pressures. Wedge pressure 29 mmHg. --No lesion to explain inferior defect on Myoview and echocardiogram.   . Endarterectomy popliteal Left 03/26/2014    Procedure: Left popliteal and peroneal artery endarterectomy with vein patch  angioplasty;  Surgeon: Sherren Kerns, MD;  Location: [facility] OR;  Service: Vascular;  Laterality: Left;  . Amputation Left 03/26/2014    Procedure: AMPUTATION SECOND LEFT TOE;  Surgeon: Sherren Kerns, MD;  Location: [facility] OR;  Service: Vascular;  Laterality: Left;  . Av fistula placement Left 04/28/2014    Procedure: CREATION OF LEFT BRACHIOCEPHALIC  ARTERIOVENOUS (AV) FISTULA CREATION;  Surgeon: Fransisco Hertz, MD;  Location: [facility] OR;  Service: Vascular;  Laterality: Left;  . Amputation Left 05/10/2014    Procedure: AMPUTATION BELOW KNEE;  Surgeon: Larina Earthly, MD;  Location: [facility] OR;  Service: Vascular;  Laterality: Left;  . Esophagogastroduodenoscopy N/A 06/13/2014    Procedure: ESOPHAGOGASTRODUODENOSCOPY (EGD);  Surgeon: Jeani Hawking, MD;  Location: [facility] ENDOSCOPY;  Service: Endoscopy;  Laterality: N/A;  . Transthoracic echocardiogram  03/22/2014    EF 30-35%, mild LVH. Severe inferior hypokinesis. Mild MR.  Marland Kitchen Below knee leg amputation Left     VITAL SIGNS BP 104/56 mmHg  Pulse 100  Ht 5\' 3"  (1.6 m)  Wt 79 lb (35.834 kg)  BMI 14.00 kg/m2  Patient's Medications  New Prescriptions   No medications on file  Previous Medications   AMINO ACIDS-PROTEIN HYDROLYS (FEEDING SUPPLEMENT, PRO-STAT SUGAR FREE 64,) LIQD    Take 30 mLs by mouth 3 (three) times daily with meals.   ASPIRIN EC 81 MG TABLET    Take 81 mg by mouth daily.   CARVEDILOL (COREG) 6.25 MG TABLET    Take 6.25 mg by mouth 2 (two) times daily with a meal.   CETIRIZINE (ZYRTEC) 10 MG TABLET    Take 10 mg by mouth daily as needed for allergies.    Colace 100 mg  Take twice daily    DARBEPOETIN ALFA (ARANESP) 200 MCG/0.4ML SOSY INJECTION    Inject 0.4 mLs (200 mcg total) into the vein every Thursday with hemodialysis.   DOCUSATE SODIUM (COLACE) 50 MG CAPSULE    Take 1 capsule (50 mg total) by mouth 2 (two) times daily. Hold for diarrhea   DOXERCALCIFEROL (HECTOROL) 4 MCG/2ML INJECTION    Inject 0.5 mLs (1 mcg total) into the vein Every  Tuesday,Thursday,and Saturday with dialysis.   INSULIN ASPART (NOVOLOG) 100 UNIT/ML INJECTION    5 units prior to meals for cbg >=150   INSULIN GLARGINE (LANTUS) 100 UNIT/ML INJECTION    Inject 0.03 mLs (3 Units total) into the skin daily.   LIDOCAINE-PRILOCAINE (EMLA) CREAM    Apply 1 application topically 3 (three) times a week. apply to dialysis site every Tuesday, Thursday, Saturday prior to dialysis   MULTIPLE VITAMINS-MINERALS (DECUBI-VITE) CAPS    Take 2 capsules by mouth daily.   NUTRITIONAL SUPPLEMENTS (FEEDING SUPPLEMENT, NEPRO CARB STEADY,) LIQD    Take 237 mLs by mouth 2 (two) times daily between meals.   OXYCODONE-ACETAMINOPHEN (PERCOCET/ROXICET) 5-325 MG PER TABLET    Take one tablet by mouth every 8 hours as needed for pain. Not to exceed 3gm APAP from all sources/24 hours   PANTOPRAZOLE (PROTONIX) 40 MG  TABLET    Take 1 tablet (40 mg total) by mouth daily.   PRAVASTATIN (PRAVACHOL) 20 MG TABLET    Take 20 mg by mouth every evening.    PROMETHAZINE (PHENERGAN) 25 MG TABLET    Take 25 mg by mouth every 6 (six) hours as needed for nausea or vomiting.   SACCHAROMYCES BOULARDII (FLORASTOR) 250 MG CAPSULE    Take 1 capsule (250 mg total) by mouth 2 (two) times daily.   SEVELAMER CARBONATE (RENVELA) 800 MG TABLET    Take 1 tablet (800 mg total) by mouth 3 (three) times daily with meals.  Modified Medications   No medications on file  Discontinued Medications   No medications on file     SIGNIFICANT DIAGNOSTIC EXAMS  05-04-14: chest x-ray: mild chf with bilateral air space disease and small bilateral pleural effusion   05-09-14: chest x-ray: 1. Stable mild cardiomegaly. 2. Decreased pleural effusions and improved aeration.  05-09-14: left foot x-ray: Acute osteomyelitis involving in the residual 2nd metatarsal, 3rd metatarsal head, and 3rd proximal phalanx.Associated pathologic fracture of the 3rd proximal phalanx.     LABS REVIEWED:   04-01-14: wbc 12.1; hgb 9.6; hct 34.7; mcv  92.4; plt 434 05-02-14: urine culture: neg  05-09-14: wbc 8.5; hgb 11.;4 hct 37.1; mcv 87.1 plt 252; glucose 299; bun 43; creat 3.49; k+ 3.7; na++135; liver normal albumin 2.1; hgb a1c 7.0; tsh 1.242 05-11-14: wbc 9.7; hgb 10.8; hct 37.1; mcv 90.3; plt 213; glucose 114; bun 24; creat 3.12; k+4.5; na++136  05-12-14: wbc 11.9; hgb 10.4; hct 33.8; mcv 87.3 plt 225; glucose 151; bun 52; creat 4.52; k+5.0; na++135  06-28-14: wbc 5.9; hgb 11.8; hct 37.7; mcv 84.2; plt 103; glucose 86; bun 52; creat 3.74; k+5.5; na++134; albumin 2.2; phos 5.4     Review of Systems  Constitutional: Negative for appetite change and fatigue.  HENT: Negative for congestion.   Respiratory: Negative for cough, chest tightness and shortness of breath.   Cardiovascular: Negative for chest pain, palpitations and leg swelling.  Gastrointestinal: Positive for nausea, vomiting and diarrhea.  Musculoskeletal: Negative for myalgias and arthralgias.  Skin: Negative for pallor.  Neurological: Negative for dizziness.  Psychiatric/Behavioral: The patient is not nervous/anxious.       Physical Exam  Constitutional: No distress.  Frail   Eyes: Conjunctivae are normal.  Neck: Neck supple. No JVD present. No thyromegaly present.  Cardiovascular: Normal rate, regular rhythm and intact distal pulses.   Respiratory: Effort normal and breath sounds normal. No respiratory distress. She has no wheezes.  GI: Soft. Bowel sounds are normal. She exhibits no distension. There is no tenderness.  Rectal exam neg guaiac Hard stool present in rectum   Musculoskeletal: She exhibits no edema.  Able to move all extremities  Left bka   Lymphadenopathy:    She has no cervical adenopathy.  Neurological: She is alert.  Skin: Skin is warm and dry. She is not diaphoretic.  Psychiatric: She has a normal mood and affect.     ASSESSMENT/ PLAN:  1.  ESRD: is followed by nephrology; is on hemodialysis three days per week; is on sevelamer 800 mg with  meals; has 1200 cc fluid restriction  2. PAD is status post left bka; has percocet 5/325 mg every 8 hours as needed for pain   3. Chronic systolic heart failure: will continue coreg 6.25 mg twice daily asa 81 mg daily will monitor  4. Diabetes: will continue lantus 3 units nightly and novolog 5 units prior  to meals for cbg >=150 her hgb a1c is 7.0   5. Dyslipidemia: will continue pravachol 20 mg daily   6. Gerd: will continue protonix 40 mg daily   7. Protein calorie malnutrition: will continue prostat 30 cc three times daily her albumin is 2.1   8. Constipation Is worse; will give dulcolax supp today; will check kub; will stop the colace and will begin senna s 2 tabs twice daily    Will check cbc; cmp; lipids; hgb a1c    Synthia Innocent NP [facility]  Contact [phone] Monday through Friday 8am- 5pm  After hours call [phone]

## 2014-10-19 ENCOUNTER — Other Ambulatory Visit: Payer: Self-pay | Admitting: Plastic Surgery

## 2014-10-20 DIAGNOSIS — L97511 Non-pressure chronic ulcer of other part of right foot limited to breakdown of skin: Secondary | ICD-10-CM | POA: Diagnosis not present

## 2014-10-26 ENCOUNTER — Other Ambulatory Visit: Payer: Self-pay | Admitting: Plastic Surgery

## 2014-10-28 NOTE — Progress Notes (Signed)
Pt resides at [facility], [address]. She will be transported via [employer] transportation.

## 2014-10-29 ENCOUNTER — Ambulatory Visit (HOSPITAL_COMMUNITY)
Admission: RE | Admit: 2014-10-29 | Discharge: 2014-10-29 | Disposition: A | Discharge status: Skilled Nursing Facility | Payer: Medicare Other | Source: Ambulatory Visit | Attending: Plastic Surgery | Admitting: Plastic Surgery

## 2014-10-29 ENCOUNTER — Ambulatory Visit (HOSPITAL_COMMUNITY): Payer: Medicare Other | Admitting: Anesthesiology

## 2014-10-29 ENCOUNTER — Encounter (HOSPITAL_COMMUNITY)
Admission: RE | Disposition: A | Discharge status: Skilled Nursing Facility | Payer: Self-pay | Source: Ambulatory Visit | Attending: Plastic Surgery

## 2014-10-29 ENCOUNTER — Encounter (HOSPITAL_COMMUNITY): Payer: Self-pay | Admitting: *Deleted

## 2014-10-29 DIAGNOSIS — E1140 Type 2 diabetes mellitus with diabetic neuropathy, unspecified: Secondary | ICD-10-CM | POA: Diagnosis not present

## 2014-10-29 DIAGNOSIS — N186 End stage renal disease: Secondary | ICD-10-CM | POA: Insufficient documentation

## 2014-10-29 DIAGNOSIS — Z9889 Other specified postprocedural states: Secondary | ICD-10-CM | POA: Diagnosis not present

## 2014-10-29 DIAGNOSIS — I739 Peripheral vascular disease, unspecified: Secondary | ICD-10-CM | POA: Insufficient documentation

## 2014-10-29 DIAGNOSIS — Z8249 Family history of ischemic heart disease and other diseases of the circulatory system: Secondary | ICD-10-CM | POA: Diagnosis not present

## 2014-10-29 DIAGNOSIS — Z794 Long term (current) use of insulin: Secondary | ICD-10-CM | POA: Diagnosis not present

## 2014-10-29 DIAGNOSIS — N319 Neuromuscular dysfunction of bladder, unspecified: Secondary | ICD-10-CM | POA: Diagnosis not present

## 2014-10-29 DIAGNOSIS — E785 Hyperlipidemia, unspecified: Secondary | ICD-10-CM | POA: Insufficient documentation

## 2014-10-29 DIAGNOSIS — Z7982 Long term (current) use of aspirin: Secondary | ICD-10-CM | POA: Insufficient documentation

## 2014-10-29 DIAGNOSIS — I2510 Atherosclerotic heart disease of native coronary artery without angina pectoris: Secondary | ICD-10-CM | POA: Insufficient documentation

## 2014-10-29 DIAGNOSIS — L97519 Non-pressure chronic ulcer of other part of right foot with unspecified severity: Secondary | ICD-10-CM | POA: Insufficient documentation

## 2014-10-29 DIAGNOSIS — I120 Hypertensive chronic kidney disease with stage 5 chronic kidney disease or end stage renal disease: Secondary | ICD-10-CM | POA: Diagnosis not present

## 2014-10-29 DIAGNOSIS — Z8673 Personal history of transient ischemic attack (TIA), and cerebral infarction without residual deficits: Secondary | ICD-10-CM | POA: Insufficient documentation

## 2014-10-29 DIAGNOSIS — Z807 Family history of other malignant neoplasms of lymphoid, hematopoietic and related tissues: Secondary | ICD-10-CM | POA: Insufficient documentation

## 2014-10-29 DIAGNOSIS — L89159 Pressure ulcer of sacral region, unspecified stage: Secondary | ICD-10-CM | POA: Insufficient documentation

## 2014-10-29 DIAGNOSIS — Z8489 Family history of other specified conditions: Secondary | ICD-10-CM | POA: Diagnosis not present

## 2014-10-29 DIAGNOSIS — Z89512 Acquired absence of left leg below knee: Secondary | ICD-10-CM | POA: Diagnosis not present

## 2014-10-29 DIAGNOSIS — R339 Retention of urine, unspecified: Secondary | ICD-10-CM | POA: Insufficient documentation

## 2014-10-29 DIAGNOSIS — Z79899 Other long term (current) drug therapy: Secondary | ICD-10-CM | POA: Insufficient documentation

## 2014-10-29 HISTORY — PX: INCISION AND DRAINAGE OF WOUND: SHX1803

## 2014-10-29 HISTORY — PX: APPLICATION OF A-CELL OF EXTREMITY: SHX6303

## 2014-10-29 LAB — POCT I-STAT 4, (NA,K, GLUC, HGB,HCT)
GLUCOSE: 212 mg/dL — AB (ref 65–99)
HCT: 48 % — ABNORMAL HIGH (ref 36.0–46.0)
Hemoglobin: 16.3 g/dL — ABNORMAL HIGH (ref 12.0–15.0)
POTASSIUM: 4.6 mmol/L (ref 3.5–5.1)
SODIUM: 130 mmol/L — AB (ref 135–145)

## 2014-10-29 LAB — GLUCOSE, CAPILLARY
GLUCOSE-CAPILLARY: 199 mg/dL — AB (ref 65–99)
GLUCOSE-CAPILLARY: 180 mg/dL — AB (ref 65–99)
GLUCOSE-CAPILLARY: 186 mg/dL — AB (ref 65–99)

## 2014-10-29 SURGERY — IRRIGATION AND DEBRIDEMENT WOUND
Anesthesia: General

## 2014-10-29 MED ORDER — PHENYLEPHRINE HCL 10 MG/ML IJ SOLN
INTRAMUSCULAR | Status: AC
  Filled 2014-10-29: qty 1

## 2014-10-29 MED ORDER — CEFAZOLIN SODIUM-DEXTROSE 2-3 GM-% IV SOLR
2.0000 g | INTRAVENOUS | Status: AC
  Administered 2014-10-29: 2 g via INTRAVENOUS

## 2014-10-29 MED ORDER — PHENYLEPHRINE HCL 10 MG/ML IJ SOLN
10.0000 mg | INTRAVENOUS | Status: DC | PRN
  Administered 2014-10-29: 40 ug/min via INTRAVENOUS
  Administered 2014-10-29: 100 ug/min via INTRAVENOUS

## 2014-10-29 MED ORDER — PROPOFOL 10 MG/ML IV BOLUS
INTRAVENOUS | Status: DC | PRN
  Administered 2014-10-29: 120 mg via INTRAVENOUS

## 2014-10-29 MED ORDER — ONDANSETRON HCL 4 MG/2ML IJ SOLN
INTRAMUSCULAR | Status: AC
Start: 2014-10-29 — End: 2014-10-29
  Filled 2014-10-29: qty 2

## 2014-10-29 MED ORDER — FENTANYL CITRATE (PF) 250 MCG/5ML IJ SOLN
INTRAMUSCULAR | Status: AC
  Filled 2014-10-29: qty 5

## 2014-10-29 MED ORDER — MIDAZOLAM HCL 5 MG/5ML IJ SOLN
INTRAMUSCULAR | Status: DC | PRN
  Administered 2014-10-29: 2 mg via INTRAVENOUS

## 2014-10-29 MED ORDER — MIDAZOLAM HCL 2 MG/2ML IJ SOLN
INTRAMUSCULAR | Status: AC
  Filled 2014-10-29: qty 4

## 2014-10-29 MED ORDER — ONDANSETRON HCL 4 MG/2ML IJ SOLN
INTRAMUSCULAR | Status: DC | PRN
  Administered 2014-10-29: 4 mg via INTRAVENOUS

## 2014-10-29 MED ORDER — LIDOCAINE-EPINEPHRINE 1 %-1:100000 IJ SOLN
INTRAMUSCULAR | Status: DC | PRN
  Administered 2014-10-29: 5 mL

## 2014-10-29 MED ORDER — PHENYLEPHRINE HCL 10 MG/ML IJ SOLN
INTRAMUSCULAR | Status: DC | PRN
  Administered 2014-10-29 (×2): 80 ug via INTRAVENOUS
  Administered 2014-10-29 (×2): 120 ug via INTRAVENOUS

## 2014-10-29 MED ORDER — 0.9 % SODIUM CHLORIDE (POUR BTL) OPTIME
TOPICAL | Status: DC | PRN
  Administered 2014-10-29: 1000 mL

## 2014-10-29 MED ORDER — SODIUM CHLORIDE 0.9 % IV SOLN
Freq: Once | INTRAVENOUS | Status: AC
  Administered 2014-10-29: 13:00:00 via INTRAVENOUS

## 2014-10-29 MED ORDER — SODIUM CHLORIDE 0.9 % IR SOLN
Status: DC | PRN
  Administered 2014-10-29: 500 mL

## 2014-10-29 MED ORDER — SUCCINYLCHOLINE CHLORIDE 20 MG/ML IJ SOLN
INTRAMUSCULAR | Status: DC | PRN
  Administered 2014-10-29: 100 mg via INTRAVENOUS

## 2014-10-29 MED ORDER — SODIUM CHLORIDE 0.9 % IV SOLN
INTRAVENOUS | Status: DC | PRN
  Administered 2014-10-29: 15:00:00 via INTRAVENOUS

## 2014-10-29 MED ORDER — LIDOCAINE HCL (CARDIAC) 20 MG/ML IV SOLN
INTRAVENOUS | Status: DC | PRN
  Administered 2014-10-29: 100 mg via INTRAVENOUS

## 2014-10-29 MED ORDER — LIDOCAINE-EPINEPHRINE 1 %-1:100000 IJ SOLN
INTRAMUSCULAR | Status: AC
  Filled 2014-10-29: qty 1

## 2014-10-29 MED ORDER — FENTANYL CITRATE (PF) 100 MCG/2ML IJ SOLN
INTRAMUSCULAR | Status: DC | PRN
  Administered 2014-10-29: 100 ug via INTRAVENOUS

## 2014-10-29 MED ORDER — CEFAZOLIN SODIUM-DEXTROSE 2-3 GM-% IV SOLR
INTRAVENOUS | Status: AC
  Filled 2014-10-29: qty 50

## 2014-10-29 SURGICAL SUPPLY — 51 items
APPLICATOR COTTON TIP 6IN STRL (MISCELLANEOUS) ×3 IMPLANT
BAG DECANTER FOR FLEXI CONT (MISCELLANEOUS) IMPLANT
BENZOIN TINCTURE PRP APPL 2/3 (GAUZE/BANDAGES/DRESSINGS) ×3 IMPLANT
BNDG GAUZE ELAST 4 BULKY (GAUZE/BANDAGES/DRESSINGS) ×3 IMPLANT
CANISTER SUCTION 2500CC (MISCELLANEOUS) ×3 IMPLANT
CONT SPEC STER OR (MISCELLANEOUS) IMPLANT
COVER SURGICAL LIGHT HANDLE (MISCELLANEOUS) ×3 IMPLANT
DRAPE IMP U-DRAPE 54X76 (DRAPES) ×3 IMPLANT
DRAPE INCISE IOBAN 66X45 STRL (DRAPES) IMPLANT
DRAPE LAPAROSCOPIC ABDOMINAL (DRAPES) IMPLANT
DRAPE PED LAPAROTOMY (DRAPES) ×3 IMPLANT
DRAPE PROXIMA HALF (DRAPES) IMPLANT
DRSG ADAPTIC 3X8 NADH LF (GAUZE/BANDAGES/DRESSINGS) ×3 IMPLANT
DRSG PAD ABDOMINAL 8X10 ST (GAUZE/BANDAGES/DRESSINGS) ×3 IMPLANT
DRSG VAC ATS LRG SENSATRAC (GAUZE/BANDAGES/DRESSINGS) IMPLANT
DRSG VAC ATS MED SENSATRAC (GAUZE/BANDAGES/DRESSINGS) IMPLANT
DRSG VAC ATS SM SENSATRAC (GAUZE/BANDAGES/DRESSINGS) IMPLANT
ELECT CAUTERY BLADE 6.4 (BLADE) ×3 IMPLANT
ELECT REM PT RETURN 9FT ADLT (ELECTROSURGICAL) ×3
ELECTRODE REM PT RTRN 9FT ADLT (ELECTROSURGICAL) ×1 IMPLANT
GAUZE SPONGE 4X4 12PLY STRL (GAUZE/BANDAGES/DRESSINGS) ×3 IMPLANT
GLOVE BIO SURGEON STRL SZ 6.5 (GLOVE) ×6 IMPLANT
GLOVE BIO SURGEONS STRL SZ 6.5 (GLOVE) ×3
GOWN STRL REUS W/ TWL LRG LVL3 (GOWN DISPOSABLE) ×3 IMPLANT
GOWN STRL REUS W/TWL LRG LVL3 (GOWN DISPOSABLE) ×6
KIT BASIN OR (CUSTOM PROCEDURE TRAY) ×3 IMPLANT
KIT ROOM TURNOVER OR (KITS) ×3 IMPLANT
MATRIX SURGICAL PSM 7X10CM (Tissue) ×3 IMPLANT
MICROMATRIX 1000MG (Tissue) ×3 IMPLANT
NEEDLE HYPO 25GX1X1/2 BEV (NEEDLE) ×3 IMPLANT
NS IRRIG 1000ML POUR BTL (IV SOLUTION) ×3 IMPLANT
PACK GENERAL/GYN (CUSTOM PROCEDURE TRAY) ×3 IMPLANT
PACK ORTHO EXTREMITY (CUSTOM PROCEDURE TRAY) ×3 IMPLANT
PACK UNIVERSAL I (CUSTOM PROCEDURE TRAY) ×3 IMPLANT
PAD ARMBOARD 7.5X6 YLW CONV (MISCELLANEOUS) ×6 IMPLANT
SOLUTION PARTIC MCRMTRX 1000MG (Tissue) ×1 IMPLANT
STAPLER VISISTAT 35W (STAPLE) ×3 IMPLANT
STOCKINETTE IMPERVIOUS 9X36 MD (GAUZE/BANDAGES/DRESSINGS) IMPLANT
STOCKINETTE IMPERVIOUS LG (DRAPES) IMPLANT
SURGILUBE 2OZ TUBE FLIPTOP (MISCELLANEOUS) IMPLANT
SUT SILK 4 0 PS 2 (SUTURE) ×3 IMPLANT
SUT VIC AB 5-0 PS2 18 (SUTURE) ×3 IMPLANT
SWAB COLLECTION DEVICE MRSA (MISCELLANEOUS) IMPLANT
SYRINGE 10CC LL (SYRINGE) ×3 IMPLANT
TOWEL OR 17X24 6PK STRL BLUE (TOWEL DISPOSABLE) ×3 IMPLANT
TOWEL OR 17X26 10 PK STRL BLUE (TOWEL DISPOSABLE) ×3 IMPLANT
TUBE ANAEROBIC SPECIMEN COL (MISCELLANEOUS) IMPLANT
TUBE CONNECTING 12'X1/4 (SUCTIONS) ×2
TUBE CONNECTING 12X1/4 (SUCTIONS) ×4 IMPLANT
UNDERPAD 30X30 INCONTINENT (UNDERPADS AND DIAPERS) ×3 IMPLANT
YANKAUER SUCT BULB TIP NO VENT (SUCTIONS) ×3 IMPLANT

## 2014-10-29 NOTE — H&P (Signed)
Elizabeth [name] is an 69 y.o. female.   Chief Complaint: sacral ulcer and right foot ulcer HPI: The patient is a 69 yrs old female with multiple medical problems.  Here for treatment of her sacral ulcer and right foot ulcer.  She has been dealing with the wounds for months and we plan to do a debridement.  Past Medical History  Diagnosis Date  . Coronary artery disease, non-occlusive January 2016    Moderate p&mLAD & Ostial OM; Myoview Nuclear Stress Test 03/21/2014: EF 35%. Mild hypokinesis of the inferior wall with fixed inferior defect suggesting scar. (no culprit lesion on CATH)  . 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 Critical lower limb ischemia     Status post left BKA following left popliteal and peroneal artery endarterectomy with patch angioplasty.  . Diabetic neuropathy   . Essential hypertension   . Hyperlipidemia with target LDL less than 70   . History of CVA (cerebrovascular accident) 08/19/2013    "minor one"  . ESRD (end stage renal disease)     [address], [place], [place] (06/12/2014)  . Urinary retention     DX NEUROGENIC BLADDER--HAS INDWELLING FOLEY CATHETER  . Foley catheter in place     for urinary retention  . Anemia   . Hematemesis April 2016    Hospitalized  . Arthritis of knee, right     "right knee" (06/12/2014)  . Gout     "was in my LLE"  . Diabetes mellitus, insulin dependent (IDDM), uncontrolled     Brittle; has h/o Hypo & Hyperglycemic episodes, Type II    Past Surgical History  Procedure Laterality Date  . Insertion of suprapubic catheter N/A 05/16/2012    Procedure: INSERTION OF SUPRAPUBIC CATHETER;  Surgeon: Sebastian Ache, MD;  Location: [facility] ORS;  Service: Urology;  Laterality: N/A;  . Bascilic vein transposition Left 10/30/2012    Procedure: LEFT 1ST STAGE BASCILIC VEIN  TRANSPOSITION;  Surgeon: Chuck Hint, MD;  Location: [facility] OR;  Service: Vascular;  Laterality: Left;  . Nm myocar perf wall motion  04/19/2012/ 02/2014    a) low risk study-EF 44%,mild inferolateral hypkinesis; b) EF 35%. Mild hypokinesis of the inferior wall with fixed inferior defect suggesting scar.  . Bascilic vein transposition Left 02/05/2013    Procedure: BASCILIC VEIN TRANSPOSITION 2ND STAGE;  Surgeon: Chuck Hint, MD;  Location: [facility] OR;  Service: Vascular;  Laterality: Left;  . Radiology with anesthesia N/A 10/25/2013    Procedure: RADIOLOGY WITH ANESTHESIA;  Surgeon: Medication Radiologist, MD;  Location: [facility] OR;  Service: Radiology;  Laterality: N/A;  . Fistulogram Left 04/29/2013    Procedure: FISTULOGRAM;  Surgeon: Chuck Hint, MD;  Location: [facility] CATH LAB;  Service: Cardiovascular;  Laterality: Left;  . Angioplasty Left 04/29/2013    Procedure: ANGIOPLASTY;  Surgeon: Chuck Hint, MD;  Location: [facility] CATH LAB;  Service: Cardiovascular;  Laterality: Left;  . Abdominal aortagram N/A 03/19/2014    Procedure: ABDOMINAL Ronny Flurry;  Surgeon: Sherren Kerns, MD;  Location: [facility] CATH LAB;  Service: Cardiovascular;  Laterality: N/A;  . Left and right heart catheterization with coronary angiogram N/A 03/24/2014    Procedure: LEFT AND RIGHT HEART CATHETERIZATION WITH CORONARY ANGIOGRAM;  Surgeon: Marykay Lex, MD;  Location: [facility] CATH LAB;  Service: Cardiovascular; Moderate disease in p-mLAD & Ostial OM1; normal right  heart pressures. Wedge pressure 29 mmHg. --No lesion to explain inferior defect on Myoview and echocardiogram.   . Endarterectomy popliteal Left 03/26/2014    Procedure: Left popliteal and peroneal artery endarterectomy with vein patch angioplasty;  Surgeon: Sherren Kerns, MD;  Location: [facility] OR;  Service: Vascular;  Laterality: Left;  . Amputation Left 03/26/2014    Procedure: AMPUTATION SECOND LEFT TOE;  Surgeon: Sherren Kerns, MD;  Location: [facility] OR;   Service: Vascular;  Laterality: Left;  . Av fistula placement Left 04/28/2014    Procedure: CREATION OF LEFT BRACHIOCEPHALIC  ARTERIOVENOUS (AV) FISTULA CREATION;  Surgeon: Fransisco Hertz, MD;  Location: [facility] OR;  Service: Vascular;  Laterality: Left;  . Amputation Left 05/10/2014    Procedure: AMPUTATION BELOW KNEE;  Surgeon: Larina Earthly, MD;  Location: [facility] OR;  Service: Vascular;  Laterality: Left;  . Esophagogastroduodenoscopy N/A 06/13/2014    Procedure: ESOPHAGOGASTRODUODENOSCOPY (EGD);  Surgeon: Jeani Hawking, MD;  Location: [facility] ENDOSCOPY;  Service: Endoscopy;  Laterality: N/A;  . Transthoracic echocardiogram  03/22/2014    EF 30-35%, mild LVH. Severe inferior hypokinesis. Mild MR.  Marland Kitchen Below knee leg amputation Left   . Fistulogram Left 10/08/2014    Procedure: LEFT ARM FISTULOGRAM;  Surgeon: Fransisco Hertz, MD;  Location: [facility] OR;  Service: Vascular;  Laterality: Left;  . Venoplasty Left 10/08/2014    Procedure: LEFT CEPHALIC VEIN VENOPLASTY;  Surgeon: Fransisco Hertz, MD;  Location: [facility] OR;  Service: Vascular;  Laterality: Left;    Family History  Problem Relation Age of Onset  . Hyperlipidemia Mother   . Hypertension Mother   . Hodgkin's lymphoma Father   . Heart disease Sister    Social History:  reports that she has never smoked. She has never used smokeless tobacco. She reports that she does not drink alcohol or use illicit drugs.  Allergies: No Known Allergies  Medications Prior to Admission  Medication Sig Dispense Refill  . aspirin EC 81 MG tablet Take 81 mg by mouth daily.    Marland Kitchen Besifloxacin HCl (BESIVANCE) 0.6 % SUSP Place 1 drop into the right eye 3 (three) times daily.     . Difluprednate (DUREZOL) 0.05 % EMUL Place 1 drop into the right eye See admin instructions. Instill one drop into right eye 3 times daily on 10/08/14 (one day)    . glucose blood (ONETOUCH VERIO) test strip Use as instructed to check blood sugar 4 times per day dx code E13.10 150 each 3  . insulin aspart (NOVOLOG)  100 UNIT/ML injection Inject 0-9 Units into the skin 3 (three) times daily with meals. Sliding scale CBG 70 - 120: 0 units CBG 121 - 150: 1 unit,  CBG 151 - 200: 2 units,  CBG 201 - 250: 3 units,  CBG 251 - 300: 5 units,  CBG 301 - 350: 7 units,  CBG 351 - 400: 9 units   CBG > 400: 9 units and notify your MD (Patient taking differently: Inject 3-6 Units into the skin See admin instructions. Pt injects 3 units in the morning, 5 units before lunch and 6 units in the evening) 10 mL 11  . insulin glargine (LANTUS) 100 UNIT/ML injection Inject 0.03 mLs (3 Units total) into the skin daily. (Patient taking differently: Inject 3-6 Units into the skin See admin instructions. Pt injects 6 units in the morning and 3 units at bedtime) 10 mL 11  . Multiple Vitamins-Minerals (DECUBI-VITE) CAPS Take 2 capsules by mouth daily.    Marland Kitchen  nepafenac (ILEVRO) 0.3 % ophthalmic suspension Place 1 drop into both eyes See admin instructions. Pt instills one drop into left at bedtime every Monday, Tuesday and Wednesday - pt instills one drop into right eye every night at bedtime    . ONETOUCH DELICA LANCETS FINE MISC Use to check blood sugar 4 times per day 200 each 3  . pantoprazole (PROTONIX) 40 MG tablet Take 1 tablet (40 mg total) by mouth daily.    . pravastatin (PRAVACHOL) 20 MG tablet Take 20 mg by mouth every evening.     . promethazine (PHENERGAN) 25 MG tablet Take 25 mg by mouth every 6 (six) hours as needed for nausea or vomiting.    . sevelamer carbonate (RENVELA) 800 MG tablet Take 1 tablet (800 mg total) by mouth 3 (three) times daily with meals.    . Amino Acids-Protein Hydrolys (FEEDING SUPPLEMENT, PRO-STAT SUGAR FREE 64,) LIQD Take 30 mLs by mouth 3 (three) times daily with meals.    . cetirizine (ZYRTEC) 10 MG tablet Take 10 mg by mouth daily as needed for allergies.     . Darbepoetin Alfa (ARANESP) 200 MCG/0.4ML SOSY injection Inject 0.4 mLs (200 mcg total) into the vein every Thursday with hemodialysis. 1.68 mL    . docusate sodium (COLACE) 50 MG capsule Take 1 capsule (50 mg total) by mouth 2 (two) times daily. Hold for diarrhea    . doxercalciferol (HECTOROL) 4 MCG/2ML injection Inject 0.5 mLs (1 mcg total) into the vein Every Tuesday,Thursday,and Saturday with dialysis. 2 mL   . lidocaine-prilocaine (EMLA) cream Apply 1 application topically 3 (three) times a week. apply to dialysis site every Tuesday, Thursday, Saturday prior to dialysis    . Nutritional Supplements (FEEDING SUPPLEMENT, NEPRO CARB STEADY,) LIQD Take 237 mLs by mouth 2 (two) times daily between meals.    Marland Kitchen oxyCODONE-acetaminophen (PERCOCET/ROXICET) 5-325 MG per tablet Take one tablet by mouth every 8 hours as needed for pain. Not to exceed 3gm APAP from all sources/24 hours (Patient taking differently: Take 1 tablet by mouth every 8 (eight) hours as needed for moderate pain or severe pain. . Not to exceed 3gm APAP from all sources/24 hours) 90 tablet 0  . saccharomyces boulardii (FLORASTOR) 250 MG capsule Take 1 capsule (250 mg total) by mouth 2 (two) times daily.    Marland Kitchen senna-docusate (SENOKOT-S) 8.6-50 MG per tablet Take 1 tablet by mouth 2 (two) times daily.      Results for orders placed or performed during the hospital encounter of 10/29/14 (from the past 48 hour(s))  Glucose, capillary     Status: Abnormal   Collection Time: 10/29/14 11:28 AM  Result Value Ref Range   Glucose-Capillary 199 (H) 65 - 99 mg/dL  I-STAT 4, (NA,K, GLUC, HGB,HCT)     Status: Abnormal   Collection Time: 10/29/14 12:04 PM  Result Value Ref Range   Sodium 130 (L) 135 - 145 mmol/L   Potassium 4.6 3.5 - 5.1 mmol/L   Glucose, Bld 212 (H) 65 - 99 mg/dL   HCT 16.1 (H) 09.6 - 04.5 %   Hemoglobin 16.3 (H) 12.0 - 15.0 g/dL   No results found.  Review of Systems  Constitutional: Negative.   HENT: Negative.   Eyes: Negative.   Respiratory: Negative.   Cardiovascular: Negative.   Genitourinary: Negative.   Musculoskeletal: Negative.   Skin: Negative.      Blood pressure 124/64, pulse 109, temperature 97.7 F (36.5 C), temperature source Oral, resp. rate 18, height  (  1.6 m), weight 38.159 kg (84 lb 2 oz), SpO2 100 %. Physical Exam  Constitutional: She appears well-developed and well-nourished.  HENT:  Head: Normocephalic and atraumatic.  Eyes: Conjunctivae are normal. Pupils are equal, round, and reactive to light.  Cardiovascular: Normal rate.   Respiratory: Effort normal.  GI: Soft.  Neurological: She is alert.  Skin: Skin is warm.  Psychiatric: She has a normal mood and affect. Her behavior is normal. Judgment and thought content normal.     Assessment/Plan Plan for debridement with acell placement.  Risks and complications were reviewed.  SANGER,Tashona Calk 10/29/2014, 1:17 PM

## 2014-10-29 NOTE — Anesthesia Procedure Notes (Signed)
Procedure Name: Intubation Date/Time: 10/29/2014 3:35 PM Performed by: Charm Barges, Caya Soberanis R Pre-anesthesia Checklist: Patient identified, Emergency Drugs available, Suction available, Patient being monitored and Timeout performed Patient Re-evaluated:Patient Re-evaluated prior to inductionOxygen Delivery Method: Circle system utilized Preoxygenation: Pre-oxygenation with 100% oxygen Intubation Type: IV induction Ventilation: Mask ventilation without difficulty Laryngoscope Size: Mac and 3 Grade View: Grade II Tube type: Oral Tube size: 7.5 mm Number of attempts: 1 Airway Equipment and Method: Stylet Placement Confirmation: ETT inserted through vocal cords under direct vision Secured at: 20 cm Tube secured with: Tape Dental Injury: Teeth and Oropharynx as per pre-operative assessment

## 2014-10-29 NOTE — Anesthesia Postprocedure Evaluation (Signed)
Anesthesia Post Note  Patient: Scientist, product/process development  Procedure(s) Performed: Procedure(s) (LRB): IRRIGATION AND DEBRIDEMENT OF SACRAL WOUND, PLACEMENT OF A-CELL (N/A) PLACEMENT OF APPLICATION OF A-CELL (N/A)  Anesthesia type: General  Patient location: PACU  Post pain: Pain level controlled and Adequate analgesia  Post assessment: Post-op Vital signs reviewed, Patient's Cardiovascular Status Stable, Respiratory Function Stable, Patent Airway and Pain level controlled  Last Vitals:  Filed Vitals:   10/29/14 1802  BP:   Pulse:   Temp: 36.4 C  Resp:     Post vital signs: Reviewed and stable  Level of consciousness: awake, alert  and oriented  Complications: No apparent anesthesia complications

## 2014-10-29 NOTE — Transfer of Care (Signed)
Immediate Anesthesia Transfer of Care Note  Patient: Elizabeth [name]  Procedure(s) Performed: Procedure(s): IRRIGATION AND DEBRIDEMENT OF SACRAL WOUND, PLACEMENT OF A-CELL (N/A) PLACEMENT OF APPLICATION OF A-CELL (N/A)  Patient Location: PACU  Anesthesia Type:General  Level of Consciousness: sedated and patient cooperative  Airway & Oxygen Therapy: Patient Spontanous Breathing and Patient connected to nasal cannula oxygen  Post-op Assessment: Report given to RN, Post -op Vital signs reviewed and stable and Patient moving all extremities  Post vital signs: Reviewed and stable  Last Vitals:  Filed Vitals:   10/29/14 1136  BP: 124/64  Pulse: 109  Temp: 36.5 C  Resp: 18    Complications: No apparent anesthesia complications

## 2014-10-29 NOTE — Anesthesia Preprocedure Evaluation (Signed)
Anesthesia Evaluation  Patient identified by MRN, date of birth, ID band Patient awake    Reviewed: Allergy & Precautions, H&P , NPO status , Patient's Chart, lab work & pertinent test results  Airway Mallampati: II  TM Distance: >3 FB Neck ROM: Full    Dental no notable dental hx. (+) Partial Upper, Edentulous Lower, Dental Advisory Given   Pulmonary neg pulmonary ROS,    Pulmonary exam normal breath sounds clear to auscultation       Cardiovascular hypertension, Pt. on medications + CAD and + Peripheral Vascular Disease   Rhythm:Regular Rate:Normal     Neuro/Psych negative neurological ROS  negative psych ROS   GI/Hepatic negative GI ROS, Neg liver ROS,   Endo/Other  diabetes, Type 1, Insulin Dependent  Renal/GU ESRF and DialysisRenal disease  negative genitourinary   Musculoskeletal   Abdominal   Peds  Hematology negative hematology ROS (+)   Anesthesia Other Findings   Reproductive/Obstetrics negative OB ROS                             Anesthesia Physical Anesthesia Plan  ASA: III  Anesthesia Plan: General   Post-op Pain Management:    Induction: Intravenous, Rapid sequence and Cricoid pressure planned  Airway Management Planned: Oral ETT  Additional Equipment:   Intra-op Plan:   Post-operative Plan: Extubation in OR  Informed Consent: I have reviewed the patients History and Physical, chart, labs and discussed the procedure including the risks, benefits and alternatives for the proposed anesthesia with the patient or authorized representative who has indicated his/her understanding and acceptance.   Dental advisory given  Plan Discussed with: CRNA  Anesthesia Plan Comments:         Anesthesia Quick Evaluation

## 2014-10-29 NOTE — Op Note (Signed)
Operative Note   DATE OF OPERATION: 10/29/2014  LOCATION: [facility]  SURGICAL DIVISION: Plastic Surgery  PREOPERATIVE DIAGNOSES:  Sacral ulcer 2 x 2 x 1 cm with undermining of 8 x 10 cm  POSTOPERATIVE DIAGNOSES:  same  PROCEDURE:  Preparation of sacral ulcer for placement of acell (powder 1 gm and sheet 7 x 10 cm)  SURGEON: Wayland Denis, DO  ASSISTANT: Shawn Rayburn, PA  ANESTHESIA:  General.   COMPLICATIONS: None.   INDICATIONS FOR PROCEDURE:  The patient, [name] is a 69 y.o. female born on 19-Nov-1945, is here for treatment of a sacral ulcer. MRN: [id number]  CONSENT:  Informed consent was obtained directly from the patient. Risks, benefits and alternatives were fully discussed. Specific risks including but not limited to bleeding, infection, hematoma, seroma, scarring, pain, infection, contracture, asymmetry, wound healing problems, and need for further surgery were all discussed. The patient did have an ample opportunity to have questions answered to satisfaction.   DESCRIPTION OF PROCEDURE:  The patient was taken to the operating room. SCDs were placed and IV antibiotics were given. The patient's operative site was prepped and draped in a sterile fashion. A time out was performed and all information was confirmed to be correct.  General anesthesia was administered.  The patient was in the prone position.  The area was irrigated with antibiotic solution.  The #15 blade was used to debride the skin and soft tissue.  There was significant undermining with a rine type scar tissue.  This was debrided sharply.  The bovie was used for electrocautery.  The area was then packed with all the Acell and half the sheet applied.  An adaptic was placed and secured with 4-0 Silk. KY gel and gauze was placed. The patient tolerated the procedure well.  There were no complications. The patient was allowed to wake from anesthesia, extubated and taken to the recovery room in  satisfactory condition.

## 2014-10-29 NOTE — Brief Op Note (Signed)
10/29/2014  4:23 PM  PATIENT:  Elizabeth [name]  69 y.o. female  PRE-OPERATIVE DIAGNOSIS:  RIGHT FOOT ULCHER,SACRO ULCER  POST-OPERATIVE DIAGNOSIS:  * No post-op diagnosis entered *  PROCEDURE:  Procedure(s): IRRIGATION AND DEBRIDEMENT EXTREMITY RIGHT FOOT WOUND AND SACRAL WOUND (Right) PLACMENT OF APPLICATION OF A-CELL (Right)  SURGEON:  Surgeon(s) and Role:    * Irfan Veal Sanger, DO - Primary  PHYSICIAN ASSISTANT: none  ASSISTANTS: none   ANESTHESIA:   general  EBL:     BLOOD ADMINISTERED:none  DRAINS: none   LOCAL MEDICATIONS USED:  LIDOCAINE   SPECIMEN:  No Specimen  DISPOSITION OF SPECIMEN:  N/A  COUNTS:  YES  TOURNIQUET:  * No tourniquets in log *  DICTATION: .Dragon Dictation  PLAN OF CARE: Discharge to home after PACU  PATIENT DISPOSITION:  PACU - hemodynamically stable.   Delay start of Pharmacological VTE agent (>24hrs) due to surgical blood loss or risk of bleeding: no

## 2014-10-30 ENCOUNTER — Encounter (HOSPITAL_COMMUNITY): Payer: Self-pay | Admitting: Plastic Surgery

## 2014-10-31 ENCOUNTER — Non-Acute Institutional Stay (SKILLED_NURSING_FACILITY): Payer: Medicare Other | Admitting: Adult Health

## 2014-10-31 DIAGNOSIS — Z794 Long term (current) use of insulin: Secondary | ICD-10-CM

## 2014-10-31 DIAGNOSIS — I5022 Chronic systolic (congestive) heart failure: Secondary | ICD-10-CM

## 2014-10-31 DIAGNOSIS — N186 End stage renal disease: Secondary | ICD-10-CM | POA: Diagnosis not present

## 2014-10-31 DIAGNOSIS — K5909 Other constipation: Secondary | ICD-10-CM

## 2014-10-31 DIAGNOSIS — E1122 Type 2 diabetes mellitus with diabetic chronic kidney disease: Secondary | ICD-10-CM

## 2014-10-31 DIAGNOSIS — E785 Hyperlipidemia, unspecified: Secondary | ICD-10-CM | POA: Diagnosis not present

## 2014-10-31 DIAGNOSIS — L98429 Non-pressure chronic ulcer of back with unspecified severity: Secondary | ICD-10-CM

## 2014-10-31 DIAGNOSIS — L89154 Pressure ulcer of sacral region, stage 4: Secondary | ICD-10-CM

## 2014-10-31 DIAGNOSIS — Z992 Dependence on renal dialysis: Secondary | ICD-10-CM | POA: Diagnosis not present

## 2014-10-31 DIAGNOSIS — K5900 Constipation, unspecified: Secondary | ICD-10-CM | POA: Diagnosis not present

## 2014-11-03 ENCOUNTER — Non-Acute Institutional Stay (SKILLED_NURSING_FACILITY): Payer: Medicare Other | Admitting: Adult Health

## 2014-11-03 DIAGNOSIS — L89154 Pressure ulcer of sacral region, stage 4: Secondary | ICD-10-CM

## 2014-11-03 DIAGNOSIS — L98429 Non-pressure chronic ulcer of back with unspecified severity: Secondary | ICD-10-CM

## 2014-11-05 ENCOUNTER — Encounter: Payer: Self-pay | Admitting: Vascular Surgery

## 2014-11-07 ENCOUNTER — Ambulatory Visit (INDEPENDENT_AMBULATORY_CARE_PROVIDER_SITE_OTHER): Payer: Self-pay | Admitting: Vascular Surgery

## 2014-11-07 ENCOUNTER — Ambulatory Visit (HOSPITAL_COMMUNITY)
Admission: RE | Admit: 2014-11-07 | Discharge: 2014-11-07 | Disposition: A | Discharge status: Home / Self Care | Payer: Medicare Other | Source: Ambulatory Visit | Attending: Vascular Surgery | Admitting: Vascular Surgery

## 2014-11-07 ENCOUNTER — Encounter: Payer: Self-pay | Admitting: Vascular Surgery

## 2014-11-07 VITALS — BP 106/68 | HR 95 | Temp 97.7°F | Resp 16 | Ht 63.0 in | Wt 84.0 lb

## 2014-11-07 DIAGNOSIS — Z4931 Encounter for adequacy testing for hemodialysis: Secondary | ICD-10-CM | POA: Diagnosis not present

## 2014-11-07 DIAGNOSIS — N186 End stage renal disease: Secondary | ICD-10-CM | POA: Diagnosis not present

## 2014-11-07 NOTE — Progress Notes (Signed)
    Postoperative Access Visit   History of Present Illness  Kery Oaxaca is a 69 y.o. year old female who presents for postoperative follow-up for: L arm fistulogram, PTA cephalic vein (Date: 10/08/14).  The patient was done in hybrid OR to facilitate a cephalic vein turndown, but on the fistulogram, the only axillary vein present was a proximal segment that is not easily accessible surgically.  I elected to venoplasty the fistula: there was near complete resolution with serial venoplasty  For VQI Use Only  PRE-ADM LIVING: Home  AMB STATUS: Ambulatory  Physical Examination Filed Vitals:   11/07/14 1207  BP: 106/68  Pulse: 95  Temp: 97.7 F (36.5 C)  Resp: 16    LUE: strong thrill and bruit, DNVI  Medical Decision Making  Tessia Luevanos is a 69 y.o. year old female who presents s/p L BC AVF venoplasty.  Will continue with q3 month access duplex given the likelihood of recurrent venous stenosis.  Thank you for allowing [place] to participate in this patient's care.  Leonides Sake, MD [facility] Office: [phone] Pager: [phone]  11/07/2014, 12:34 PM

## 2014-11-07 NOTE — Addendum Note (Signed)
Addended by: Adria Dill L on: 11/07/2014 02:26 PM   Modules accepted: Orders

## 2014-11-10 ENCOUNTER — Encounter (HOSPITAL_BASED_OUTPATIENT_CLINIC_OR_DEPARTMENT_OTHER): Payer: Medicare Other | Attending: Plastic Surgery

## 2014-11-10 DIAGNOSIS — L97511 Non-pressure chronic ulcer of other part of right foot limited to breakdown of skin: Secondary | ICD-10-CM | POA: Insufficient documentation

## 2014-11-10 DIAGNOSIS — M869 Osteomyelitis, unspecified: Secondary | ICD-10-CM | POA: Insufficient documentation

## 2014-11-10 DIAGNOSIS — L89153 Pressure ulcer of sacral region, stage 3: Secondary | ICD-10-CM | POA: Diagnosis not present

## 2014-11-10 DIAGNOSIS — N186 End stage renal disease: Secondary | ICD-10-CM | POA: Diagnosis not present

## 2014-11-10 DIAGNOSIS — E1151 Type 2 diabetes mellitus with diabetic peripheral angiopathy without gangrene: Secondary | ICD-10-CM | POA: Diagnosis not present

## 2014-11-10 DIAGNOSIS — M109 Gout, unspecified: Secondary | ICD-10-CM | POA: Diagnosis not present

## 2014-11-10 DIAGNOSIS — I120 Hypertensive chronic kidney disease with stage 5 chronic kidney disease or end stage renal disease: Secondary | ICD-10-CM | POA: Insufficient documentation

## 2014-11-10 DIAGNOSIS — I739 Peripheral vascular disease, unspecified: Secondary | ICD-10-CM | POA: Insufficient documentation

## 2014-11-10 DIAGNOSIS — E11621 Type 2 diabetes mellitus with foot ulcer: Secondary | ICD-10-CM | POA: Insufficient documentation

## 2014-11-10 DIAGNOSIS — E1140 Type 2 diabetes mellitus with diabetic neuropathy, unspecified: Secondary | ICD-10-CM | POA: Insufficient documentation

## 2014-11-10 DIAGNOSIS — I2510 Atherosclerotic heart disease of native coronary artery without angina pectoris: Secondary | ICD-10-CM | POA: Diagnosis not present

## 2014-11-10 DIAGNOSIS — M1990 Unspecified osteoarthritis, unspecified site: Secondary | ICD-10-CM | POA: Diagnosis not present

## 2014-11-17 ENCOUNTER — Ambulatory Visit (HOSPITAL_COMMUNITY)
Admission: RE | Admit: 2014-11-17 | Discharge: 2014-11-17 | Disposition: A | Discharge status: Home / Self Care | Payer: Medicare Other | Source: Ambulatory Visit | Attending: Gastroenterology | Admitting: Gastroenterology

## 2014-11-19 ENCOUNTER — Other Ambulatory Visit: Payer: Medicare Other

## 2014-11-24 ENCOUNTER — Encounter: Payer: Self-pay | Admitting: Endocrinology

## 2014-11-24 ENCOUNTER — Ambulatory Visit (INDEPENDENT_AMBULATORY_CARE_PROVIDER_SITE_OTHER): Payer: Medicare Other | Admitting: Endocrinology

## 2014-11-24 ENCOUNTER — Encounter (HOSPITAL_COMMUNITY)
Admission: RE | Disposition: A | Discharge status: Home / Self Care | Payer: Self-pay | Source: Ambulatory Visit | Attending: Gastroenterology

## 2014-11-24 ENCOUNTER — Ambulatory Visit (HOSPITAL_COMMUNITY)
Admission: RE | Admit: 2014-11-24 | Discharge: 2014-11-24 | Disposition: A | Discharge status: Home / Self Care | Payer: Medicare Other | Source: Ambulatory Visit | Attending: Gastroenterology | Admitting: Gastroenterology

## 2014-11-24 DIAGNOSIS — M109 Gout, unspecified: Secondary | ICD-10-CM | POA: Insufficient documentation

## 2014-11-24 DIAGNOSIS — E1069 Type 1 diabetes mellitus with other specified complication: Secondary | ICD-10-CM

## 2014-11-24 DIAGNOSIS — R1310 Dysphagia, unspecified: Secondary | ICD-10-CM | POA: Diagnosis present

## 2014-11-24 DIAGNOSIS — E1140 Type 2 diabetes mellitus with diabetic neuropathy, unspecified: Secondary | ICD-10-CM | POA: Insufficient documentation

## 2014-11-24 DIAGNOSIS — I120 Hypertensive chronic kidney disease with stage 5 chronic kidney disease or end stage renal disease: Secondary | ICD-10-CM | POA: Insufficient documentation

## 2014-11-24 DIAGNOSIS — Z89422 Acquired absence of other left toe(s): Secondary | ICD-10-CM | POA: Diagnosis not present

## 2014-11-24 DIAGNOSIS — IMO0002 Reserved for concepts with insufficient information to code with codable children: Secondary | ICD-10-CM

## 2014-11-24 DIAGNOSIS — Z8673 Personal history of transient ischemic attack (TIA), and cerebral infarction without residual deficits: Secondary | ICD-10-CM | POA: Insufficient documentation

## 2014-11-24 DIAGNOSIS — Z89512 Acquired absence of left leg below knee: Secondary | ICD-10-CM | POA: Diagnosis not present

## 2014-11-24 DIAGNOSIS — Z794 Long term (current) use of insulin: Secondary | ICD-10-CM | POA: Insufficient documentation

## 2014-11-24 DIAGNOSIS — I739 Peripheral vascular disease, unspecified: Secondary | ICD-10-CM | POA: Insufficient documentation

## 2014-11-24 DIAGNOSIS — E785 Hyperlipidemia, unspecified: Secondary | ICD-10-CM | POA: Diagnosis not present

## 2014-11-24 DIAGNOSIS — E1065 Type 1 diabetes mellitus with hyperglycemia: Secondary | ICD-10-CM

## 2014-11-24 DIAGNOSIS — I2510 Atherosclerotic heart disease of native coronary artery without angina pectoris: Secondary | ICD-10-CM | POA: Diagnosis not present

## 2014-11-24 DIAGNOSIS — N186 End stage renal disease: Secondary | ICD-10-CM | POA: Diagnosis not present

## 2014-11-24 DIAGNOSIS — M1990 Unspecified osteoarthritis, unspecified site: Secondary | ICD-10-CM | POA: Insufficient documentation

## 2014-11-24 DIAGNOSIS — K220 Achalasia of cardia: Secondary | ICD-10-CM | POA: Diagnosis not present

## 2014-11-24 HISTORY — PX: ESOPHAGEAL MANOMETRY: SHX5429

## 2014-11-24 LAB — POCT GLYCOSYLATED HEMOGLOBIN (HGB A1C): Hemoglobin A1C: 7

## 2014-11-24 SURGERY — MANOMETRY, ESOPHAGUS

## 2014-11-24 MED ORDER — LIDOCAINE VISCOUS 2 % MT SOLN
OROMUCOSAL | Status: AC
  Filled 2014-11-24: qty 15

## 2014-11-24 SURGICAL SUPPLY — 2 items
FACESHIELD LNG OPTICON STERILE (SAFETY) IMPLANT
GLOVE BIO SURGEON STRL SZ8 (GLOVE) ×6 IMPLANT

## 2014-11-24 NOTE — H&P (Signed)
Elizabeth [name] HPI: The patient was evaluated for chest pain earlier in the year and identified to have radiographic and endoscopic evidence of Achalasia.  She did not follow up as scheduled and now she is here for a manometry.  Past Medical History  Diagnosis Date  . Coronary artery disease, non-occlusive January 2016    Moderate p&mLAD & Ostial OM; Myoview Nuclear Stress Test 03/21/2014: EF 35%. Mild hypokinesis of the inferior wall with fixed inferior defect suggesting scar. (no culprit lesion on CATH)  . 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 Critical lower limb ischemia     Status post left BKA following left popliteal and peroneal artery endarterectomy with patch angioplasty.  . Diabetic neuropathy (HCC)   . Essential hypertension   . Hyperlipidemia with target LDL less than 70   . History of CVA (cerebrovascular accident) 08/19/2013    "minor one"  . ESRD (end stage renal disease) (HCC)     [address], [place], [place] (06/12/2014)  . Urinary retention     DX NEUROGENIC BLADDER--HAS INDWELLING FOLEY CATHETER  . Foley catheter in place     for urinary retention  . Anemia   . Hematemesis April 2016    Hospitalized  . Arthritis of knee, right     "right knee" (06/12/2014)  . Gout     "was in my LLE"  . Diabetes mellitus, insulin dependent (IDDM), uncontrolled (HCC)     Brittle; has h/o Hypo & Hyperglycemic episodes, Type II    Past Surgical History  Procedure Laterality Date  . Insertion of suprapubic catheter N/A 05/16/2012    Procedure: INSERTION OF SUPRAPUBIC CATHETER;  Surgeon: Sebastian Ache, MD;  Location: [facility] ORS;  Service: Urology;  Laterality: N/A;  . Bascilic vein transposition Left 10/30/2012    Procedure: LEFT 1ST STAGE BASCILIC VEIN TRANSPOSITION;  Surgeon: Chuck Hint, MD;   Location: [facility] OR;  Service: Vascular;  Laterality: Left;  . Nm myocar perf wall motion  04/19/2012/ 02/2014    a) low risk study-EF 44%,mild inferolateral hypkinesis; b) EF 35%. Mild hypokinesis of the inferior wall with fixed inferior defect suggesting scar.  . Bascilic vein transposition Left 02/05/2013    Procedure: BASCILIC VEIN TRANSPOSITION 2ND STAGE;  Surgeon: Chuck Hint, MD;  Location: [facility] OR;  Service: Vascular;  Laterality: Left;  . Radiology with anesthesia N/A 10/25/2013    Procedure: RADIOLOGY WITH ANESTHESIA;  Surgeon: Medication Radiologist, MD;  Location: [facility] OR;  Service: Radiology;  Laterality: N/A;  . Fistulogram Left 04/29/2013    Procedure: FISTULOGRAM;  Surgeon: Chuck Hint, MD;  Location: [facility] CATH LAB;  Service: Cardiovascular;  Laterality: Left;  . Angioplasty Left 04/29/2013    Procedure: ANGIOPLASTY;  Surgeon: Chuck Hint, MD;  Location: [facility] CATH LAB;  Service: Cardiovascular;  Laterality: Left;  . Abdominal aortagram N/A 03/19/2014    Procedure: ABDOMINAL Ronny Flurry;  Surgeon: Sherren Kerns, MD;  Location: [facility] CATH LAB;  Service: Cardiovascular;  Laterality: N/A;  . Left and right heart catheterization with coronary angiogram N/A 03/24/2014    Procedure: LEFT AND RIGHT HEART CATHETERIZATION WITH CORONARY ANGIOGRAM;  Surgeon: Marykay Lex, MD;  Location: [facility] CATH LAB;  Service: Cardiovascular; Moderate disease in p-mLAD & Ostial OM1; normal right heart pressures. Wedge pressure 29 mmHg. --No lesion to explain inferior defect on Myoview and  echocardiogram.   . Endarterectomy popliteal Left 03/26/2014    Procedure: Left popliteal and peroneal artery endarterectomy with vein patch angioplasty;  Surgeon: Sherren Kerns, MD;  Location: [facility] OR;  Service: Vascular;  Laterality: Left;  . Amputation Left 03/26/2014    Procedure: AMPUTATION SECOND LEFT TOE;  Surgeon: Sherren Kerns, MD;  Location: [facility] OR;  Service: Vascular;  Laterality: Left;  . Av fistula  placement Left 04/28/2014    Procedure: CREATION OF LEFT BRACHIOCEPHALIC  ARTERIOVENOUS (AV) FISTULA CREATION;  Surgeon: Fransisco Hertz, MD;  Location: [facility] OR;  Service: Vascular;  Laterality: Left;  . Amputation Left 05/10/2014    Procedure: AMPUTATION BELOW KNEE;  Surgeon: Larina Earthly, MD;  Location: [facility] OR;  Service: Vascular;  Laterality: Left;  . Esophagogastroduodenoscopy N/A 06/13/2014    Procedure: ESOPHAGOGASTRODUODENOSCOPY (EGD);  Surgeon: Jeani Hawking, MD;  Location: [facility] ENDOSCOPY;  Service: Endoscopy;  Laterality: N/A;  . Transthoracic echocardiogram  03/22/2014    EF 30-35%, mild LVH. Severe inferior hypokinesis. Mild MR.  Marland Kitchen Below knee leg amputation Left   . Fistulogram Left 10/08/2014    Procedure: LEFT ARM FISTULOGRAM;  Surgeon: Fransisco Hertz, MD;  Location: [facility] OR;  Service: Vascular;  Laterality: Left;  . Venoplasty Left 10/08/2014    Procedure: LEFT CEPHALIC VEIN VENOPLASTY;  Surgeon: Fransisco Hertz, MD;  Location: [facility] OR;  Service: Vascular;  Laterality: Left;  . Incision and drainage of wound N/A 10/29/2014    Procedure: IRRIGATION AND DEBRIDEMENT OF SACRAL WOUND, PLACEMENT OF A-CELL;  Surgeon: Wayland Denis, DO;  Location: [facility] OR;  Service: Plastics;  Laterality: N/A;  . Application of a-cell of extremity N/A 10/29/2014    Procedure: PLACEMENT OF APPLICATION OF A-CELL;  Surgeon: Wayland Denis, DO;  Location: [facility] OR;  Service: Plastics;  Laterality: N/A;    Family History  Problem Relation Age of Onset  . Hyperlipidemia Mother   . Hypertension Mother   . Hodgkin's lymphoma Father   . Heart disease Sister     Social History:  reports that she has never smoked. She has never used smokeless tobacco. She reports that she does not drink alcohol or use illicit drugs.  Allergies: No Known Allergies  Medications: Scheduled: Continuous:  Results for orders placed or performed in visit on 11/24/14 (from the past 24 hour(s))  POCT HgB A1C     Status: Abnormal   Collection Time: 11/24/14  11:58 AM  Result Value Ref Range   Hemoglobin A1C 7      No results found.  ROS:  As stated above in the HPI otherwise negative.  Height  (1.6 m), weight 38.102 kg (84 lb).    PE: Nursing procedure  Assessment/Plan: 1) Achlasia - Manometry.  Courage Biglow D 11/24/2014, 12:52 PM

## 2014-11-24 NOTE — Patient Instructions (Signed)
IMPORTANT: Please document all the blood sugar that you are checking on the patient and have them sorted by time of day. Please send all blood sugars with the patient on each visit to my office  LANTUS insulin: Need to give this every morning at the same time and not hold it on the days of dialysis  Please make sure  blood sugars are being checked every other day at 8 PM  Continue doses of insulin unchanged

## 2014-11-24 NOTE — Progress Notes (Signed)
Patient ID: Elizabeth [name], female   DOB: Jul 08, 1945, 69 y.o.   MRN: [id number]    Reason for Appointment: Follow-up for Diabetes  Referring physician: Lerry Liner  History of Present Illness:          Diagnosis: Type 2 diabetes mellitus, date of diagnosis: 1996        Past history:  She was initially treated with metformin and glyburide and her detailed history or level of control is not available She was switched to insulin probably in 2013 She had been taking a regimen of Lantus at night and NovoLog before meals prior to her initial consultation  Recent history:   INSULIN regimen is described as: Lantus 6 units in the morning, NovoLog 3-5-8 units at meals  She still is in a rehabilitation nursing facility  On her last visit she was taking Lantus twice a day with 3 units at night but now appears that she is getting this only in the morning Because of higher blood sugars after supper her Novolog was increased to 8 units at suppertime  Her A1c tends to be lower than expected possibly because of her anemia and end-stage renal disease  Current blood sugar patterns and problems with management:  Not clear how often her blood sugars are being checked.  She thinks it is being checked consistently daily but her records do not have blood sugar increase on a daily basis  She appears to have periodic high readings over 200 as follows: 3 times last month, once late morning  Has only one low sugar documented last month which was 37 at 8 AM; glucose was 64 at 8 PM once otherwise sugars have not been low later in the day  She thinks that her Lantus is not being given to her in the mornings on the day of dialysis even though she leaves at 10:30 AM  None of the blood sugars being monitored are being checked after supper  Again he does difficult to analyze her blood sugars as they are not organized by time of day; also appears to sometimes they are entered late in the day and  not at the time it was taken  Her nursing home was instructed on giving her insulin before eating rather than postprandially and not clear if this instruction is being followed  Unclear what kind of diet she is getting  She is still relatively inactive, came in a wheelchair again today  She complained of feeling hypoglycemic in the office but her blood sugar was 164  Glucose monitoring: 3-4 times a day Glucometer:  unknown  Recent blood sugar ranged 37-281 from printout from nursing home, checked at various times but mostly in the mornings, no readings after supper  Glycemic control:    Lab Results  Component Value Date   HGBA1C 7 11/24/2014   HGBA1C 6.7* 06/26/2014   HGBA1C 6.7* 06/12/2014   Lab Results  Component Value Date   LDLCALC 76 03/21/2014   CREATININE 5.49* 08/13/2014     Self-care: The diet that the patient has been following is: None       Meals: 3 meals per day. Breakfast is variable,  lunch is a sandwich and ginger ale. Mixed meal at dinner, snacks are sugar-free desserts, fruit or applesauce         Exercise: None           Dietician visit: Most recent: Several years ago.  consultation with CDE in 09/2013  Weight history:  Wt Readings from Last 3 Encounters:  11/24/14 84 lb (38.102 kg)  11/07/14 84 lb (38.102 kg)  10/29/14 84 lb 2 oz (38.159 kg)       Medication List    Notice    This visit is during an admission. Changes to the med list made in this visit will be reflected in the After Visit Summary of the admission.      Allergies: No Known Allergies  Past Medical History  Diagnosis Date  . Coronary artery disease, non-occlusive January 2016    Moderate p&mLAD & Ostial OM; Myoview Nuclear Stress Test 03/21/2014: EF 35%. Mild hypokinesis of the inferior wall with fixed inferior defect suggesting scar. (no culprit lesion on CATH)  . 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 Critical lower limb ischemia     Status post left BKA following left popliteal and peroneal artery endarterectomy with patch angioplasty.  . Diabetic neuropathy (HCC)   . Essential hypertension   . Hyperlipidemia with target LDL less than 70   . History of CVA (cerebrovascular accident) 08/19/2013    "minor one"  . ESRD (end stage renal disease) (HCC)     [address], [place], [place] (06/12/2014)  . Urinary retention     DX NEUROGENIC BLADDER--HAS INDWELLING FOLEY CATHETER  . Foley catheter in place     for urinary retention  . Anemia   . Hematemesis April 2016    Hospitalized  . Arthritis of knee, right     "right knee" (06/12/2014)  . Gout     "was in my LLE"  . Diabetes mellitus, insulin dependent (IDDM), uncontrolled (HCC)     Brittle; has h/o Hypo & Hyperglycemic episodes, Type II    Past Surgical History  Procedure Laterality Date  . Insertion of suprapubic catheter N/A 05/16/2012    Procedure: INSERTION OF SUPRAPUBIC CATHETER;  Surgeon: Sebastian Ache, MD;  Location: [facility] ORS;  Service: Urology;  Laterality: N/A;  . Bascilic vein transposition Left 10/30/2012    Procedure: LEFT 1ST STAGE BASCILIC VEIN TRANSPOSITION;  Surgeon: Chuck Hint, MD;  Location: [facility] OR;  Service: Vascular;  Laterality: Left;  . Nm myocar perf wall motion  04/19/2012/ 02/2014    a) low risk study-EF 44%,mild inferolateral hypkinesis; b) EF 35%. Mild hypokinesis of the inferior wall with fixed inferior defect suggesting scar.  . Bascilic vein transposition Left 02/05/2013    Procedure: BASCILIC VEIN TRANSPOSITION 2ND STAGE;  Surgeon: Chuck Hint, MD;  Location: [facility] OR;  Service: Vascular;  Laterality: Left;  . Radiology with anesthesia N/A 10/25/2013    Procedure: RADIOLOGY WITH ANESTHESIA;  Surgeon: Medication Radiologist, MD;  Location: [facility] OR;  Service: Radiology;   Laterality: N/A;  . Fistulogram Left 04/29/2013    Procedure: FISTULOGRAM;  Surgeon: Chuck Hint, MD;  Location: [facility] CATH LAB;  Service: Cardiovascular;  Laterality: Left;  . Angioplasty Left 04/29/2013    Procedure: ANGIOPLASTY;  Surgeon: Chuck Hint, MD;  Location: [facility] CATH LAB;  Service: Cardiovascular;  Laterality: Left;  . Abdominal aortagram N/A 03/19/2014    Procedure: ABDOMINAL Ronny Flurry;  Surgeon: Sherren Kerns, MD;  Location: [facility] CATH LAB;  Service: Cardiovascular;  Laterality: N/A;  . Left and right heart catheterization with coronary angiogram N/A 03/24/2014    Procedure: LEFT AND RIGHT HEART CATHETERIZATION WITH  CORONARY ANGIOGRAM;  Surgeon: Marykay Lex, MD;  Location: [facility] CATH LAB;  Service: Cardiovascular; Moderate disease in p-mLAD & Ostial OM1; normal right heart pressures. Wedge pressure 29 mmHg. --No lesion to explain inferior defect on Myoview and echocardiogram.   . Endarterectomy popliteal Left 03/26/2014    Procedure: Left popliteal and peroneal artery endarterectomy with vein patch angioplasty;  Surgeon: Sherren Kerns, MD;  Location: [facility] OR;  Service: Vascular;  Laterality: Left;  . Amputation Left 03/26/2014    Procedure: AMPUTATION SECOND LEFT TOE;  Surgeon: Sherren Kerns, MD;  Location: [facility] OR;  Service: Vascular;  Laterality: Left;  . Av fistula placement Left 04/28/2014    Procedure: CREATION OF LEFT BRACHIOCEPHALIC  ARTERIOVENOUS (AV) FISTULA CREATION;  Surgeon: Fransisco Hertz, MD;  Location: [facility] OR;  Service: Vascular;  Laterality: Left;  . Amputation Left 05/10/2014    Procedure: AMPUTATION BELOW KNEE;  Surgeon: Larina Earthly, MD;  Location: [facility] OR;  Service: Vascular;  Laterality: Left;  . Esophagogastroduodenoscopy N/A 06/13/2014    Procedure: ESOPHAGOGASTRODUODENOSCOPY (EGD);  Surgeon: Jeani Hawking, MD;  Location: [facility] ENDOSCOPY;  Service: Endoscopy;  Laterality: N/A;  . Transthoracic echocardiogram  03/22/2014    EF 30-35%, mild LVH. Severe inferior  hypokinesis. Mild MR.  Marland Kitchen Below knee leg amputation Left   . Fistulogram Left 10/08/2014    Procedure: LEFT ARM FISTULOGRAM;  Surgeon: Fransisco Hertz, MD;  Location: [facility] OR;  Service: Vascular;  Laterality: Left;  . Venoplasty Left 10/08/2014    Procedure: LEFT CEPHALIC VEIN VENOPLASTY;  Surgeon: Fransisco Hertz, MD;  Location: [facility] OR;  Service: Vascular;  Laterality: Left;  . Incision and drainage of wound N/A 10/29/2014    Procedure: IRRIGATION AND DEBRIDEMENT OF SACRAL WOUND, PLACEMENT OF A-CELL;  Surgeon: Wayland Denis, DO;  Location: [facility] OR;  Service: Plastics;  Laterality: N/A;  . Application of a-cell of extremity N/A 10/29/2014    Procedure: PLACEMENT OF APPLICATION OF A-CELL;  Surgeon: Wayland Denis, DO;  Location: [facility] OR;  Service: Plastics;  Laterality: N/A;    Family History  Problem Relation Age of Onset  . Hyperlipidemia Mother   . Hypertension Mother   . Hodgkin's lymphoma Father   . Heart disease Sister     Social History:  reports that she has never smoked. She has never used smokeless tobacco. She reports that she does not drink alcohol or use illicit drugs.    Review of Systems   Eyes: She has poor vision for unknown reasons       Lipids: . Taking pravastatin 20 mg with good control       Lab Results  Component Value Date   CHOL 152 03/21/2014   HDL 54 03/21/2014   LDLCALC 76 03/21/2014   TRIG 109 03/21/2014   CHOLHDL 2.8 03/21/2014                  The blood pressure has been high previously for several years but currently only on low-dose carvedilol, followed by nephrologist     She has had end-stage kidney disease on dialysis    Previous foot exam had shown pulses are absent. Has mild decrease in monofilament sensation in her toes Has left-sided amputation  She is asking for diabetic shoes but comes in a wheelchair.  She does have a previous diabetic shoe on her right foot.  She thinks this is worn out and she thinks she has a prosthesis and is trying to walk  to  Physical  Examination:  BP 110/62 mmHg  Pulse 104  Temp(Src) 98.5 F (36.9 C)  Resp 14  Ht 5\' 3"  (1.6 m)  Wt   SpO2 97%     ASSESSMENT:  Diabetes, insulin requiring, nonobese  See history of present illness for current management, problems identified in blood sugar patterns Difficult to assess her control as her blood sugar readings are not organized on her nursing home record and not all her readings appear to be recorded; no blood sugars documented after supper According to patient she is getting her Lantus inconsistently and will get it in the afternoon on the day she goes to dialysis which probably affects her overnight blood sugars and maybe occasionally causing hypoglycemia in the morning Previously needed Lantus twice a day to keep fasting blood sugars under control and these are variable  Hyperlipidemia:  controlled with pravastatin, will repeat levels on the next visit   PLAN:   Written instructions given in detail for nursing home as follows, will need to try taking her Lantus consistently in the morning at the same time.  Instructions for monitoring, insulin and documentation given to the nurse supervisor at the nursing home  Will need to review her blood sugars after supper also to adjust her evening meal coverage  She will need to have her Novolog consistently before eating  Her fasting blood sugars start going up again will consider twice a day Lantus  To check blood sugars 4 times a day  Will defer any diabetic shoes until she is walking regularly  Patient Instructions  IMPORTANT: Please document all the blood sugar that you are checking on the patient and have them sorted by time of day. Please send all blood sugars with the patient on each visit to my office  LANTUS insulin: Need to give this every morning at the same time and not hold it on the days of dialysis  Please make sure  blood sugars are being checked every other day at 8 PM  Continue  doses of insulin unchanged   Total face-to-face time for management and coordination of care = 25 minutes   Oswin Johal 11/24/2014, 1:02 PM   Note: This office note was prepared with Insurance underwriter. Any transcriptional errors that result from this process are unintentional.

## 2014-11-25 ENCOUNTER — Encounter (HOSPITAL_COMMUNITY): Payer: Self-pay | Admitting: Gastroenterology

## 2014-12-09 ENCOUNTER — Other Ambulatory Visit: Payer: Self-pay | Admitting: Surgery

## 2014-12-09 NOTE — H&P (Signed)
Elizabeth [name]. Martello 12/09/2014 10:39 AM Location: [facility] Patient #: [id number] DOB: Dec 14, 1945 Single / Language: [place] / Race: Black or African American Female  History of Present Illness Elizabeth Sportsman MD; 12/09/2014 11:46 AM) The patient is a 69 year old female who presents with gastroesophageal reflux disease. Patient sent for surgical consultation by Dr. Jeani Hawking for concern of worsening achalasia.  Woman with worsening issues of dysphagia. Insulin-requiring diabetes. 8 end-stage renal disease on dialysis Tuesday Thursday Saturday. Peripheral vascular disease. LEFT below the knee amputation. Had an episode of emesis. Possibly coffee ground. Found to have very dilated esophagus by endoscopy. No obstructing mass nor tumor. Barium swallow suspicious for achalasia in April 2014. Dilated esophagus but not sigmoid shaped yet.. Had held off traveling to consider pneumatic dilatation. Recently had manometry that showed type II achalasia with no peristalsis. She does have a history of nonischemic cardiomyopathy. Last ejection fraction 30%. Normal mole Cardy catheterization November 2016. Worsening peripheral vascular disease status post below the knee amputation on LEFT side earlier in the year.  She comes in today by herself in a wheelchair. She lives at a skilled nursing facility. [facility] She gets hemodialysis Tuesday Thursday Saturday. [facility].     Other Problems Gilmer Mor, CMA; 12/09/2014 10:39 AM) Arthritis Cerebrovascular Accident Chronic Renal Failure Syndrome Diabetes Mellitus Other disease, cancer, significant illness  Past Surgical History Elizabeth Sportsman, MD; 12/09/2014 11:42 AM) Cataract Surgery Bilateral. Dialysis Shunt / Fistula TUBAL LIGATION, LAPAROSCOPIC (78295) ~69 y/o  Diagnostic Studies History Gilmer Mor, CMA; 12/09/2014 10:39 AM) Colonoscopy never Mammogram >3 years  ago Pap Smear >5 years ago  Allergies Gilmer Mor, CMA; 12/09/2014 10:41 AM) No Known Drug Allergies 12/09/2014  Medication History (Sonya Bynum, CMA; 12/09/2014 10:44 AM) BD Pen Needle Mini U/F (31G X 5 MM Misc,) Active. Besivance (0.6% Suspension, Ophthalmic) Active. Durezol (0.05% Emulsion, Ophthalmic) Active. Lantus SoloStar (100UNIT/ML Soln Pen-inj, Subcutaneous) Active. NovoLOG FlexPen (100UNIT/ML Soln Pen-inj, Subcutaneous) Active. Pantoprazole Sodium (  Tablet DR, Oral) Active. Pravastatin Sodium (  Tablet, Oral) Active. Renagel (  Tablet, Oral) Active. Ilevro (0.3% Suspension, Ophthalmic) Active. Carvedilol (6.25MG  Tablet, Oral) Active. Oxycodone-Acetaminophen (5-325MG  Tablet, Oral as needed) Active. Aspirin (  Tablet Chewable, Oral) Active. Medications Reconciled  Social History Gilmer Mor, CMA; 12/09/2014 10:39 AM) Alcohol use Remotely quit alcohol use. No caffeine use Tobacco use Never smoker.  Family History Gilmer Mor, CMA; 12/09/2014 10:39 AM) Alcohol Abuse Son. Arthritis Sister. Breast Cancer Family Members In General. Cerebrovascular Accident Family Members In General. Diabetes Mellitus Family Members In General. Hypertension Mother. Kidney Disease Family Members In General. Malignant Neoplasm Of Pancreas Family Members In General. Prostate Cancer Brother. Rectal Cancer Family Members In General.  Pregnancy / Birth History Gilmer Mor, CMA; 12/09/2014 10:39 AM) Age at menarche 13 years. Age of menopause 19-50 Gravida 2 Maternal age 23-30 Para 2     Review of Systems Lamar Laundry Bynum CMA; 12/09/2014 10:39 AM) General Present- Weight Loss. Not Present- Appetite Loss, Chills, Fatigue, Fever, Night Sweats and Weight Gain. Respiratory Present- Chronic Cough. Not Present- Bloody sputum, Difficulty Breathing, Snoring and Wheezing. Breast Not Present- Breast Mass, Breast Pain, Nipple Discharge and Skin  Changes. Cardiovascular Not Present- Chest Pain, Difficulty Breathing Lying Down, Leg Cramps, Palpitations, Rapid Heart Rate, Shortness of Breath and Swelling of Extremities. Gastrointestinal Present- Chronic diarrhea, Gets full quickly at meals and Nausea. Not Present- Abdominal Pain, Bloating, Bloody Stool, Change in Bowel Habits, Constipation, Difficulty Swallowing, Excessive gas, Hemorrhoids, Indigestion, Rectal Pain and Vomiting.  Female Genitourinary Not Present- Frequency, Nocturia, Painful Urination, Pelvic Pain and Urgency. Musculoskeletal Present- Joint Pain and Joint Stiffness. Not Present- Back Pain, Muscle Pain, Muscle Weakness and Swelling of Extremities. Neurological Present- Trouble walking. Not Present- Decreased Memory, Fainting, Headaches, Numbness, Seizures, Tingling, Tremor and Weakness. Psychiatric Not Present- Anxiety, Bipolar, Change in Sleep Pattern, Depression, Fearful and Frequent crying. Hematology Not Present- Easy Bruising, Excessive bleeding, Gland problems, HIV and Persistent Infections.  Vitals (Sonya Bynum CMA; 12/09/2014 10:40 AM) 12/09/2014 10:39 AM Weight: 87 lb Height: 63in Body Surface Area: 1.36 m Body Mass Index: 15.41 kg/m  Temp.: 83F(Temporal)  Pulse: 75 (Regular)  BP: 110/70 (Sitting, Left Arm, Standard)      Physical Exam Elizabeth Sportsman MD; 12/09/2014 11:41 AM)  General Mental Status-Alert. General Appearance-Not in acute distress, Not Sickly. Orientation-Oriented X3. Hydration-Well hydrated. Voice-Normal. Note: Alert. Cachectic.  Integumentary Global Assessment Upon inspection and palpation of skin surfaces of the - Axillae: non-tender, no inflammation or ulceration, no drainage. and Distribution of scalp and body hair is normal. General Characteristics Temperature - normal warmth is noted.  Head and Neck Head-normocephalic, atraumatic with no lesions or palpable masses. Face Global Assessment -  atraumatic, no absence of expression. Neck Global Assessment - no abnormal movements, no bruit auscultated on the right, no bruit auscultated on the left, no decreased range of motion, non-tender. Trachea-midline. Thyroid Gland Characteristics - non-tender.  Eye Eyeball - Left-Extraocular movements intact, No Nystagmus. Eyeball - Right-Extraocular movements intact, No Nystagmus. Cornea - Left-No Hazy. Cornea - Right-No Hazy. Sclera/Conjunctiva - Left-No scleral icterus, No Discharge. Sclera/Conjunctiva - Right-No scleral icterus, No Discharge. Pupil - Left-Direct reaction to light normal. Pupil - Right-Direct reaction to light normal.  ENMT Ears Pinna - Left - no drainage observed, no generalized tenderness observed. Right - no drainage observed, no generalized tenderness observed. Nose and Sinuses External Inspection of the Nose - no destructive lesion observed. Inspection of the nares - Left - quiet respiration. Right - quiet respiration. Mouth and Throat Lips - Upper Lip - no fissures observed, no pallor noted. Lower Lip - no fissures observed, no pallor noted. Nasopharynx - no discharge present. Oral Cavity/Oropharynx - Tongue - no dryness observed. Oral Mucosa - no cyanosis observed. Hypopharynx - no evidence of airway distress observed.  Chest and Lung Exam Inspection Movements - Normal and Symmetrical. Accessory muscles - No use of accessory muscles in breathing. Palpation Palpation of the chest reveals - Non-tender. Auscultation Breath sounds - Normal and Clear.  Cardiovascular Auscultation Rhythm - Regular. Murmurs & Other Heart Sounds - Auscultation of the heart reveals - No Murmurs and No Systolic Clicks.  Abdomen Inspection Inspection of the abdomen reveals - No Visible peristalsis and No Abnormal pulsations. Umbilicus - No Bleeding, No Urine drainage. Palpation/Percussion Palpation and Percussion of the abdomen reveal - Soft, Non Tender, No  Rebound tenderness, No Rigidity (guarding) and No Cutaneous hyperesthesia. Note: Thin and flat. No diastases. No incisions except small area at bellybutton consistent with prior tubal ligation. No umbilical hernia.  Female Genitourinary Sexual Maturity Tanner 5 - Adult hair pattern. Note: No vaginal bleeding nor discharge  Peripheral Vascular Upper Extremity Inspection - Left - No Cyanotic nailbeds, Not Ischemic. Right - No Cyanotic nailbeds, Not Ischemic.  Neurologic Neurologic evaluation reveals -normal attention span and ability to concentrate, able to name objects and repeat phrases. Appropriate fund of knowledge , normal sensation and normal coordination. Mental Status Affect - not angry, not paranoid. Cranial Nerves-Normal Bilaterally. Gait-Normal.  Neuropsychiatric Mental status  exam performed with findings of-able to articulate well with normal speech/language, rate, volume and coherence, thought content normal with ability to perform basic computations and apply abstract reasoning and no evidence of hallucinations, delusions, obsessions or homicidal/suicidal ideation.  Musculoskeletal Global Assessment Spine, Ribs and Pelvis - no instability, subluxation or laxity. Right Upper Extremity - no instability, subluxation or laxity. Note: Status post LEFT below the knee amputation. Sitting in wheelchair. Calm. Significant atrophy/cachexia of RIGHT lower extremity but can bear weight.  Lymphatic Head & Neck  General Head & Neck Lymphatics: Bilateral - Description - No Localized lymphadenopathy. Axillary  General Axillary Region: Bilateral - Description - No Localized lymphadenopathy. Femoral & Inguinal  Generalized Femoral & Inguinal Lymphatics: Left - Description - No Localized lymphadenopathy. Right - Description - No Localized lymphadenopathy.    Assessment & Plan Elizabeth Sportsman MD; 12/09/2014 11:40 AM)  ACHALASIA (K22.0) Impression: Classic history and  physical findings for achalasia. Confirmed by barium swallow, upper endoscopy, and manometry.  She is losing weight and becoming cachectic. She would benefit from surgery to palliate her achalasia. Botox a short-term solution. She is not interested in pneumatic dilatation. Certainly from an anatomic standpoint she's not primitive. She's not had prior dilations nor Botox injections which should make the myotomy not too technically difficult.  However her numerous comorbidities including her renal failure with hemodialysis dependence and insulin-requiring diabetes increase her operative risks. She wishes to be aggressive and proceed with surgery since she is tolerated general anesthesia was numerous times this year.  I will plan to do this robot[facility] with admission to [facility] postoperatively for management of dialysis and other issues.  Current Plans You are being scheduled for surgery - Our schedulers will call you.  You should hear from our office's scheduling department within 5 working days about the location, date, and time of surgery. We try to make accommodations for patient's preferences in scheduling surgery, but sometimes the OR schedule or the surgeon's schedule prevents [place] from making those accommodations.  If you have not heard from our office [phone]) in 5 working days, call the office and ask for your surgeon's nurse.  If you have other questions about your diagnosis, plan, or surgery, call the office and ask for your surgeon's nurse.  The anatomy & physiology of the foregut and anti-reflux mechanism was discussed. The pathophysiology of achalasia was discussed. Natural history risks without surgery was discussed. The patient's symptoms are not adequately controlled by non-operative treatments. I feel the risks of no intervention will lead to serious problems that outweigh the operative risks; therefore, I recommended surgery to relax the fibrotic LES &  rebuild the anti-reflux valve to control reflux. Need for a thorough workup to rule out the differential diagnosis and plan treatment was explained. I explained laparoscopic techniques with possible need for an open approach.  Risks such as bleeding, infection, abscess, leak, need for further treatment, heart attack, death, and other risks were discussed. I noted a good likelihood this will help address the problem. Goals of post-operative recovery were discussed as well. Possibility that this will not correct all symptoms was explained. Post-operative dysphagia, need for short-term liquid & pureed diet, inability to vomit, possibility of recurrence needing further treatment, possible need for medicines to help control symptoms in addition to surgery were discussed. We will work to minimize complications. Educational handouts further explaining the pathology, treatment options, and dysphagia diet was given as well. Questions were answered. The patient expresses understanding &  wishes to proceed with surgery.  Pt Education - [facility] Achalasia HCI (Yulanda Diggs) Pt Education - [facility] Laparosopic Post Op HCI (Junell Cullifer) Pt Education - [facility] Esophageal Surgery Diet HCI (Dymphna Wadley): discussed with patient and provided information.  Elizabeth [name], M.D., F.A.C.S. Gastrointestinal and Minimally Invasive Surgery [facility] [address], [address] [place], [place] [address] [phone] Main / Paging

## 2014-12-10 ENCOUNTER — Telehealth: Payer: Self-pay | Admitting: *Deleted

## 2014-12-10 NOTE — Telephone Encounter (Signed)
1. Type of surgery: Robotic Heller cardiomyotomy & partial Toupet Funcoplication  2. Date of surgery: TBD 3. Surgeon: Dr. Michaell Cowing  4. Medications that need to be held & how long: none specified 5. Fax and/or Phone: (p) [phone]      (f) [phone] (Attn: Ethlyn Gallery, CMA)  Per MD - patient is cleared for surgery at an INTERMEDIATE RISK due to low EF  Routed via EPIC to Dr. Michaell Cowing & Ethlyn Gallery, CMA

## 2014-12-15 ENCOUNTER — Non-Acute Institutional Stay (SKILLED_NURSING_FACILITY): Payer: Medicare Other | Admitting: Adult Health

## 2014-12-15 DIAGNOSIS — I5022 Chronic systolic (congestive) heart failure: Secondary | ICD-10-CM | POA: Diagnosis not present

## 2014-12-15 DIAGNOSIS — I739 Peripheral vascular disease, unspecified: Secondary | ICD-10-CM | POA: Diagnosis not present

## 2014-12-15 DIAGNOSIS — K5900 Constipation, unspecified: Secondary | ICD-10-CM | POA: Diagnosis not present

## 2014-12-15 DIAGNOSIS — K5909 Other constipation: Secondary | ICD-10-CM

## 2014-12-15 DIAGNOSIS — E1122 Type 2 diabetes mellitus with diabetic chronic kidney disease: Secondary | ICD-10-CM | POA: Diagnosis not present

## 2014-12-15 DIAGNOSIS — E785 Hyperlipidemia, unspecified: Secondary | ICD-10-CM | POA: Diagnosis not present

## 2014-12-15 DIAGNOSIS — N186 End stage renal disease: Secondary | ICD-10-CM | POA: Diagnosis not present

## 2014-12-15 DIAGNOSIS — Z992 Dependence on renal dialysis: Secondary | ICD-10-CM

## 2014-12-15 DIAGNOSIS — Z794 Long term (current) use of insulin: Secondary | ICD-10-CM | POA: Diagnosis not present

## 2014-12-15 DIAGNOSIS — L89154 Pressure ulcer of sacral region, stage 4: Secondary | ICD-10-CM | POA: Diagnosis not present

## 2014-12-15 DIAGNOSIS — Z89512 Acquired absence of left leg below knee: Secondary | ICD-10-CM

## 2014-12-15 DIAGNOSIS — L98429 Non-pressure chronic ulcer of back with unspecified severity: Secondary | ICD-10-CM

## 2014-12-22 ENCOUNTER — Encounter: Payer: Self-pay | Admitting: Adult Health

## 2014-12-22 NOTE — Progress Notes (Signed)
Patient ID: Elizabeth [name], female   DOB: 10-Jan-1946, 69 y.o.   MRN: [id number]    Facility: [facility]      No Known Allergies  Chief Complaint  Patient presents with  . Medical Management of Chronic Issues    HPI:  She is a long term resident of this facility being seen for the management of her chronic illnesses. Overall her status remains without significant change. She will decline dialysis treatments at times. She is not voicing any complaints or concerns at this time. There are no nursing concerns at this time.    Past Medical History  Diagnosis Date  . Coronary artery disease, non-occlusive January 2016    Moderate p&mLAD & Ostial OM; Myoview Nuclear Stress Test 03/21/2014: EF 35%. Mild hypokinesis of the inferior wall with fixed inferior defect suggesting scar. (no culprit lesion on CATH)  . Peripheral vascular disease of lower extremity with ulceration ([place]) January-March 2016    PV Angiogram 03/19/2014: Bilateral AP and PT artery occlusions, 90% Left peroneal artery with single-vessel Right peroneal and flow  . Critical lower limb ischemia     Status post left BKA following left popliteal and peroneal artery endarterectomy with patch angioplasty.  . Diabetic neuropathy ([place])   . Essential hypertension   . Hyperlipidemia with target LDL less than 70   . History of CVA (cerebrovascular accident) 08/19/2013    "minor one"  . ESRD (end stage renal disease) ([place])     [address], [place], [address] (06/12/2014)  . Urinary retention     DX NEUROGENIC BLADDER--HAS INDWELLING FOLEY CATHETER  . Foley catheter in place     for urinary retention  . Anemia   . Hematemesis April 2016    Hospitalized  . Arthritis of knee, right     "right knee" (06/12/2014)  . Gout     "was in my LLE"  . Diabetes mellitus, insulin dependent (IDDM), uncontrolled ([place])     [place]; has h/o Hypo & Hyperglycemic episodes, Type II    Past Surgical History  Procedure Laterality Date  .  Insertion of suprapubic catheter N/A 05/16/2012    Procedure: INSERTION OF SUPRAPUBIC CATHETER;  Surgeon: Alexis Frock, MD;  Location: [facility];  Service: Urology;  Laterality: N/A;  . Bascilic vein transposition Left 10/30/2012    Procedure: LEFT 1ST STAGE BASCILIC VEIN TRANSPOSITION;  Surgeon: Angelia Mould, MD;  Location: [place];  Service: Vascular;  Laterality: Left;  . Nm myocar perf wall motion  04/19/2012/ 02/2014    a) low risk study-EF 44%,mild inferolateral hypkinesis; b) EF 35%. Mild hypokinesis of the inferior wall with fixed inferior defect suggesting scar.  . Bascilic vein transposition Left 02/05/2013    Procedure: BASCILIC VEIN TRANSPOSITION 2ND STAGE;  Surgeon: Angelia Mould, MD;  Location: [place];  Service: Vascular;  Laterality: Left;  . Radiology with anesthesia N/A 10/25/2013    Procedure: RADIOLOGY WITH ANESTHESIA;  Surgeon: Medication Radiologist, MD;  Location: [place];  Service: Radiology;  Laterality: N/A;  . Fistulogram Left 04/29/2013    Procedure: FISTULOGRAM;  Surgeon: Angelia Mould, MD;  Location: [facility] CATH LAB;  Service: Cardiovascular;  Laterality: Left;  . Angioplasty Left 04/29/2013    Procedure: ANGIOPLASTY;  Surgeon: Angelia Mould, MD;  Location: [facility] CATH LAB;  Service: Cardiovascular;  Laterality: Left;  . Abdominal aortagram N/A 03/19/2014    Procedure: ABDOMINAL Maxcine Ham;  Surgeon: Elam Dutch, MD;  Location: [facility] CATH LAB;  Service: Cardiovascular;  Laterality: N/A;  .  Left and right heart catheterization with coronary angiogram N/A 03/24/2014    Procedure: LEFT AND RIGHT HEART CATHETERIZATION WITH CORONARY ANGIOGRAM;  Surgeon: Leonie Man, MD;  Location: [facility] CATH LAB;  Service: Cardiovascular; Moderate disease in p-mLAD & Ostial OM1; normal right heart pressures. Wedge pressure 29 mmHg. --No lesion to explain inferior defect on Myoview and echocardiogram.   . Endarterectomy popliteal Left 03/26/2014    Procedure: Left popliteal and  peroneal artery endarterectomy with vein patch angioplasty;  Surgeon: Elam Dutch, MD;  Location: [place];  Service: Vascular;  Laterality: Left;  . Amputation Left 03/26/2014    Procedure: AMPUTATION SECOND LEFT TOE;  Surgeon: Elam Dutch, MD;  Location: [place];  Service: Vascular;  Laterality: Left;  . Av fistula placement Left 04/28/2014    Procedure: CREATION OF LEFT BRACHIOCEPHALIC  ARTERIOVENOUS (AV) FISTULA CREATION;  Surgeon: Conrad Dumas, MD;  Location: [place];  Service: Vascular;  Laterality: Left;  . Amputation Left 05/10/2014    Procedure: AMPUTATION BELOW KNEE;  Surgeon: Rosetta Posner, MD;  Location: [facility] OR;  Service: Vascular;  Laterality: Left;  . Esophagogastroduodenoscopy N/A 06/13/2014    Procedure: ESOPHAGOGASTRODUODENOSCOPY (EGD);  Surgeon: Carol Ada, MD;  Location: [facility] ENDOSCOPY;  Service: Endoscopy;  Laterality: N/A;  . Transthoracic echocardiogram  03/22/2014    EF 30-35%, mild LVH. Severe inferior hypokinesis. Mild MR.  Marland Kitchen Below knee leg amputation Left   . Fistulogram Left 10/08/2014    Procedure: LEFT ARM FISTULOGRAM;  Surgeon: Conrad La Bolt, MD;  Location: [place];  Service: Vascular;  Laterality: Left;  . Venoplasty Left 10/08/2014    Procedure: LEFT CEPHALIC VEIN VENOPLASTY;  Surgeon: Conrad Unalakleet, MD;  Location: [place];  Service: Vascular;  Laterality: Left;  . Incision and drainage of wound N/A 10/29/2014    Procedure: IRRIGATION AND DEBRIDEMENT OF SACRAL WOUND, PLACEMENT OF A-CELL;  Surgeon: Theodoro Kos, DO;  Location: [place];  Service: Plastics;  Laterality: N/A;  . Application of a-cell of extremity N/A 10/29/2014    Procedure: PLACEMENT OF APPLICATION OF A-CELL;  Surgeon: Theodoro Kos, DO;  Location: [place];  Service: Plastics;  Laterality: N/A;  . Esophageal manometry N/A 11/24/2014    Procedure: ESOPHAGEAL MANOMETRY (EM);  Surgeon: Carol Ada, MD;  Location: [facility];  Service: Endoscopy;  Laterality: N/A;    VITAL SIGNS BP 110/67 mmHg  Pulse 100  Ht _0   (1.6 m)  Wt 77 lb (34.927 kg)  BMI 13.64 kg/m2  Patient's Medications  New Prescriptions   No medications on file  Previous Medications   Asa 81 mg  Take 81 mg daily    Zyrtec 10 mg  Take 10 mg daily prn   decubi-vite  Take 2 capsules daily   lantus insulin 5 units nightly   novolog insulin 2 units with breakfast 4 units with lunch and 6 units with supper PLUS SSI: 200-249=1u; 250-450=2 units   Percocet 5/325 mg  Every 8 hours as needed   pravachol 20 mg  Take 20 mg daily    protonix 40 mg  Take 40 mg daily    florastor  Take 1 capsule twice daily    Senna s  Take 1 twice daily    sevelamer 800 mg  Take 800 mg with meals  Modified Medications   No medications on file  Discontinued Medications   No medications on file     SIGNIFICANT DIAGNOSTIC EXAMS   05-04-14: chest x-ray: mild chf with bilateral air  space disease and small bilateral pleural effusion   05-09-14: chest x-ray: 1. Stable mild cardiomegaly. 2. Decreased pleural effusions and improved aeration.  05-09-14: left foot x-ray: Acute osteomyelitis involving in the residual 2nd metatarsal, 3rd metatarsal head, and 3rd proximal phalanx.Associated pathologic fracture of the 3rd proximal phalanx.     LABS REVIEWED:   04-01-14: wbc 12.1; hgb 9.6; hct 34.7; mcv 92.4; plt 434 05-02-14: urine culture: neg  05-09-14: wbc 8.5; hgb 11.;4 hct 37.1; mcv 87.1 plt 252; glucose 299; bun 43; creat 3.49; k+ 3.7; na++135; liver normal albumin 2.1; hgb a1c 7.0; tsh 1.242 05-11-14: wbc 9.7; hgb 10.8; hct 37.1; mcv 90.3; plt 213; glucose 114; bun 24; creat 3.12; k+4.5; na++136  05-12-14: wbc 11.9; hgb 10.4; hct 33.8; mcv 87.3 plt 225; glucose 151; bun 52; creat 4.52; k+5.0; na++135  06-28-14: wbc 5.9; hgb 11.8; hct 37.7; mcv 84.2; plt 103; glucose 86; bun 52; creat 3.74; k+5.5; na++134; albumin 2.2; phos 5.4  08-13-14: wbc 5.7 hgb 12.5 ;hct 40.5; mcv 84.6; plt 153; glucose 126; bun 80; creat 5.22; k+ 5.3; na++134; alk phos 145; ast 60; alt 83;  albumin 3.0; hgb a1c 7.7; chol 146; ;ldl 44; trig 74; hdl 40      Review of Systems Constitutional: Negative for appetite change and fatigue.  HENT: Negative for congestion.   Respiratory: Negative for cough, chest tightness and shortness of breath.   Cardiovascular: Negative for chest pain, palpitations and leg swelling.  Gastrointestinal: negative for nausea, vomiting, diarrhea   Musculoskeletal: Negative for myalgias and arthralgias.  Skin: Negative for pallor.  Neurological: Negative for dizziness.  Psychiatric/Behavioral: The patient is not nervous/anxious.   Physical Exam Constitutional: No distress.  Frail   Eyes: Conjunctivae are normal.  Neck: Neck supple. No JVD present. No thyromegaly present.  Cardiovascular: Normal rate, regular rhythm and intact distal pulses.   Respiratory: Effort normal and breath sounds normal. No respiratory distress. She has no wheezes.  GI: Soft. Bowel sounds are normal. She exhibits no distension. There is no tenderness.    Musculoskeletal: She exhibits no edema.  Able to move all extremities  Left bka   Lymphadenopathy:    She has no cervical adenopathy.  Neurological: She is alert.  Skin: Skin is warm and dry. She is not diaphoretic.  Psychiatric: She has a normal mood and affect.  Sacral wound being followed by wound clinic     ASSESSMENT/ PLAN:  1.  ESRD: is followed by nephrology; is on hemodialysis three days per week; is on sevelamer 800 mg with meals; has 1200 cc fluid restriction  2. PAD is status post left bka; has percocet 5/325 mg every 8 hours as needed for pain   3. Chronic systolic heart failure: will  asa 81 mg daily will monitor is on 1200 cc fluid restriction   4. Diabetes:  her hgb a1c is 7.7  Her readings are variable. Will increase lantus to 8 units nightly will change novolog to 3 units with breakfast; 5 units with lunch and 7 units with supper   5. Dyslipidemia: will continue pravachol 20 mg daily ldl is 44    6. Gerd: will continue protonix 40 mg daily   7. Protein calorie malnutrition: will continue prostat 30 cc three times daily her albumin is 2.1   8. Constipation will continue senna s twice daily takes florastor twice daily       Ok Edwards NP [facility] Adult [facility]  Contact [phone] Monday through Friday 8am- 5pm  After  hours call [phone]

## 2014-12-23 ENCOUNTER — Encounter: Payer: Self-pay | Admitting: Adult Health

## 2014-12-23 NOTE — Progress Notes (Signed)
Patient ID: Elizabeth [name], female   DOB: 06-28-1945, 69 y.o.   MRN: [id number]    Facility: [facility]      No Known Allergies  Chief Complaint  Patient presents with  . Medical Management of Chronic Issues    HPI:  Elizabeth [name] is a long term resident of this facility being seen for the management of her chronic illnesses. There is little change in her overall status. Staff reports that Elizabeth [name] is not going to dialysis on a consistent basis. Elizabeth [name] tells me that Elizabeth [name] has been sick and missed a couple of treatments over the past month; but is feeling better now.     Past Medical History  Diagnosis Date  . Coronary artery disease, non-occlusive January 2016    Moderate p&mLAD & Ostial OM; Myoview Nuclear Stress Test 03/21/2014: EF 35%. Mild hypokinesis of the inferior wall with fixed inferior defect suggesting scar. (no culprit lesion on CATH)  . 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 Critical lower limb ischemia     Status post left BKA following left popliteal and peroneal artery endarterectomy with patch angioplasty.  . Diabetic neuropathy ([place])   . Essential hypertension   . Hyperlipidemia with target LDL less than 70   . History of CVA (cerebrovascular accident) 08/19/2013    "minor one"  . ESRD (end stage renal disease) ([place])     [address], [place], [place] (06/12/2014)  . Urinary retention     DX NEUROGENIC BLADDER--HAS INDWELLING FOLEY CATHETER  . Foley catheter in place     for urinary retention  . Anemia   . Hematemesis April 2016    Hospitalized  . Arthritis of knee, right     "right knee" (06/12/2014)  . Gout     "was in my LLE"  . Diabetes mellitus, insulin dependent (IDDM), uncontrolled ([place])     Brittle; has h/o Hypo & Hyperglycemic episodes, Type II    Past Surgical History    Procedure Laterality Date  . Insertion of suprapubic catheter N/A 05/16/2012    Procedure: INSERTION OF SUPRAPUBIC CATHETER;  Surgeon: Alexis Frock, MD;  Location: [facility];  Service: Urology;  Laterality: N/A;  . Bascilic vein transposition Left 10/30/2012    Procedure: LEFT 1ST STAGE BASCILIC VEIN TRANSPOSITION;  Surgeon: Angelia Mould, MD;  Location: [place];  Service: Vascular;  Laterality: Left;  . Nm myocar perf wall motion  04/19/2012/ 02/2014    a) low risk study-EF 44%,mild inferolateral hypkinesis; b) EF 35%. Mild hypokinesis of the inferior wall with fixed inferior defect suggesting scar.  . Bascilic vein transposition Left 02/05/2013    Procedure: BASCILIC VEIN TRANSPOSITION 2ND STAGE;  Surgeon: Angelia Mould, MD;  Location: [place];  Service: Vascular;  Laterality: Left;  . Radiology with anesthesia N/A 10/25/2013    Procedure: RADIOLOGY WITH ANESTHESIA;  Surgeon: Medication Radiologist, MD;  Location: [place];  Service: Radiology;  Laterality: N/A;  . Fistulogram Left 04/29/2013    Procedure: FISTULOGRAM;  Surgeon: Angelia Mould, MD;  Location: [facility] CATH LAB;  Service: Cardiovascular;  Laterality: Left;  . Angioplasty Left 04/29/2013    Procedure: ANGIOPLASTY;  Surgeon: Angelia Mould, MD;  Location: [facility] CATH LAB;  Service: Cardiovascular;  Laterality: Left;  . Abdominal aortagram N/A 03/19/2014    Procedure: ABDOMINAL Maxcine Ham;  Surgeon: Jessy Oto  Fields, MD;  Location: [place] CATH LAB;  Service: Cardiovascular;  Laterality: N/A;  . Left and right heart catheterization with coronary angiogram N/A 03/24/2014    Procedure: LEFT AND RIGHT HEART CATHETERIZATION WITH CORONARY ANGIOGRAM;  Surgeon: Leonie Man, MD;  Location: [facility] CATH LAB;  Service: Cardiovascular; Moderate disease in p-mLAD & Ostial OM1; normal right heart pressures. Wedge pressure 29 mmHg. --No lesion to explain inferior defect on Myoview and echocardiogram.   . Endarterectomy popliteal Left 03/26/2014     Procedure: Left popliteal and peroneal artery endarterectomy with vein patch angioplasty;  Surgeon: Elam Dutch, MD;  Location: [place];  Service: Vascular;  Laterality: Left;  . Amputation Left 03/26/2014    Procedure: AMPUTATION SECOND LEFT TOE;  Surgeon: Elam Dutch, MD;  Location: [place];  Service: Vascular;  Laterality: Left;  . Av fistula placement Left 04/28/2014    Procedure: CREATION OF LEFT BRACHIOCEPHALIC  ARTERIOVENOUS (AV) FISTULA CREATION;  Surgeon: Conrad [place], MD;  Location: [place];  Service: Vascular;  Laterality: Left;  . Amputation Left 05/10/2014    Procedure: AMPUTATION BELOW KNEE;  Surgeon: Rosetta Posner, MD;  Location: [facility] OR;  Service: Vascular;  Laterality: Left;  . Esophagogastroduodenoscopy N/A 06/13/2014    Procedure: ESOPHAGOGASTRODUODENOSCOPY (EGD);  Surgeon: Carol Ada, MD;  Location: [facility] ENDOSCOPY;  Service: Endoscopy;  Laterality: N/A;  . Transthoracic echocardiogram  03/22/2014    EF 30-35%, mild LVH. Severe inferior hypokinesis. Mild MR.  Marland Kitchen Below knee leg amputation Left   . Fistulogram Left 10/08/2014    Procedure: LEFT ARM FISTULOGRAM;  Surgeon: Conrad Allardt, MD;  Location: [place];  Service: Vascular;  Laterality: Left;  . Venoplasty Left 10/08/2014    Procedure: LEFT CEPHALIC VEIN VENOPLASTY;  Surgeon: Conrad Fairmount, MD;  Location: [place];  Service: Vascular;  Laterality: Left;  . Incision and drainage of wound N/A 10/29/2014    Procedure: IRRIGATION AND DEBRIDEMENT OF SACRAL WOUND, PLACEMENT OF A-CELL;  Surgeon: Theodoro Kos, DO;  Location: [place];  Service: Plastics;  Laterality: N/A;  . Application of a-cell of extremity N/A 10/29/2014    Procedure: PLACEMENT OF APPLICATION OF A-CELL;  Surgeon: Theodoro Kos, DO;  Location: [place];  Service: Plastics;  Laterality: N/A;  . Esophageal manometry N/A 11/24/2014    Procedure: ESOPHAGEAL MANOMETRY (EM);  Surgeon: Carol Ada, MD;  Location: [facility];  Service: Endoscopy;  Laterality: N/A;    VITAL SIGNS BP 96/50  mmHg  Pulse 90  Ht 5' 3"  (1.6 m)  Wt 84 lb (38.102 kg)  BMI 14.88 kg/m2  Patient's Medications  New Prescriptions   No medications on file  Previous Medications   AMINO ACIDS-PROTEIN HYDROLYS (FEEDING SUPPLEMENT, PRO-STAT SUGAR FREE 64,) LIQD    Take 30 mLs by mouth 3 (three) times daily with meals.   ASPIRIN EC 81 MG TABLET    Take 81 mg by mouth daily.   BESIFLOXACIN HCL (BESIVANCE) 0.6 % SUSP    Place 1 drop into the right eye 3 (three) times daily.    CETIRIZINE (ZYRTEC) 10 MG TABLET    Take 10 mg by mouth daily as needed for allergies.    DARBEPOETIN ALFA (ARANESP) 200 MCG/0.4ML SOSY INJECTION    Inject 0.4 mLs (200 mcg total) into the vein every Thursday with hemodialysis.   DIFLUPREDNATE (DUREZOL) 0.05 % EMUL    Place 1 drop into the right eye See admin instructions. Instill one drop into right eye 3 times daily on 10/08/14 (one  day)   DOCUSATE SODIUM (COLACE) 50 MG CAPSULE    Take 1 capsule (50 mg total) by mouth 2 (two) times daily. Hold for diarrhea   DOXERCALCIFEROL (HECTOROL) 4 MCG/2ML INJECTION    Inject 0.5 mLs (1 mcg total) into the vein Every Tuesday,Thursday,and Saturday with dialysis.   INSULIN ASPART (NOVOLOG) 100 UNIT/ML INJECTION    3 units with breakfast; 5 units with lunch 8 units with supper   INSULIN GLARGINE (LANTUS) 100 UNIT/ML INJECTION    Inject 0.03 mLs (3 Units total) into the skin daily.   LIDOCAINE-PRILOCAINE (EMLA) CREAM    Apply 1 application topically 3 (three) times a week. apply to dialysis site every Tuesday, Thursday, Saturday prior to dialysis   MULTIPLE VITAMINS-MINERALS (DECUBI-VITE) CAPS    Take 2 capsules by mouth daily.   NEPAFENAC (ILEVRO) 0.3 % OPHTHALMIC SUSPENSION    Place 1 drop into both eyes See admin instructions. Pt instills one drop into left at bedtime every Monday, Tuesday and Wednesday - pt instills one drop into right eye every night at bedtime   NUTRITIONAL SUPPLEMENTS (FEEDING SUPPLEMENT, NEPRO CARB STEADY,) LIQD    Take 237 mLs by  mouth 2 (two) times daily between meals.   OXYCODONE-ACETAMINOPHEN (PERCOCET/ROXICET) 5-325 MG PER TABLET    Take one tablet by mouth every 8 hours as needed for pain. Not to exceed 3gm APAP from all sources/24 hours   PANTOPRAZOLE (PROTONIX) 40 MG TABLET    Take 1 tablet (40 mg total) by mouth daily.   PRAVASTATIN (PRAVACHOL) 20 MG TABLET    Take 20 mg by mouth every evening.    PROMETHAZINE (PHENERGAN) 25 MG TABLET    Take 25 mg by mouth every 6 (six) hours as needed for nausea or vomiting.   SACCHAROMYCES BOULARDII (FLORASTOR) 250 MG CAPSULE    Take 1 capsule (250 mg total) by mouth 2 (two) times daily.   SENNA-DOCUSATE (SENOKOT-S) 8.6-50 MG PER TABLET    Take 1 tablet by mouth 2 (two) times daily.   SEVELAMER CARBONATE (RENVELA) 800 MG TABLET    Take 1 tablet (800 mg total) by mouth 3 (three) times daily with meals.  Modified Medications   No medications on file  Discontinued Medications   No medications on file     SIGNIFICANT DIAGNOSTIC EXAMS  05-04-14: chest x-ray: mild chf with bilateral air space disease and small bilateral pleural effusion   05-09-14: chest x-ray: 1. Stable mild cardiomegaly. 2. Decreased pleural effusions and improved aeration.  05-09-14: left foot x-ray: Acute osteomyelitis involving in the residual 2nd metatarsal, 3rd metatarsal head, and 3rd proximal phalanx.Associated pathologic fracture of the 3rd proximal phalanx.     LABS REVIEWED:   04-01-14: wbc 12.1; hgb 9.6; hct 34.7; mcv 92.4; plt 434 05-02-14: urine culture: neg  05-09-14: wbc 8.5; hgb 11.;4 hct 37.1; mcv 87.1 plt 252; glucose 299; bun 43; creat 3.49; k+ 3.7; na++135; liver normal albumin 2.1; hgb a1c 7.0; tsh 1.242 05-11-14: wbc 9.7; hgb 10.8; hct 37.1; mcv 90.3; plt 213; glucose 114; bun 24; creat 3.12; k+4.5; na++136  05-12-14: wbc 11.9; hgb 10.4; hct 33.8; mcv 87.3 plt 225; glucose 151; bun 52; creat 4.52; k+5.0; na++135  06-28-14: wbc 5.9; hgb 11.8; hct 37.7; mcv 84.2; plt 103; glucose 86; bun 52;  creat 3.74; k+5.5; na++134; albumin 2.2; phos 5.4  08-13-14: wbc 5.7 hgb 12.5 ;hct 40.5; mcv 84.6; plt 153; glucose 126; bun 80; creat 5.22; k+ 5.3; na++134; alk phos 145; ast 60; alt 83; albumin  3.0; hgb a1c 7.7; chol 146; ;ldl 44; trig 74; hdl 40      Review of Systems Constitutional: Negative for appetite change and fatigue.  HENT: Negative for congestion.   Respiratory: Negative for cough, chest tightness and shortness of breath.   Cardiovascular: Negative for chest pain, palpitations and leg swelling.  Gastrointestinal: negative for nausea, vomiting, diarrhea   Musculoskeletal: Negative for myalgias and arthralgias.  Skin: Negative for pallor.  Neurological: Negative for dizziness.  Psychiatric/Behavioral: The patient is not nervous/anxious.    Physical Exam Constitutional: No distress.  Frail   Eyes: Conjunctivae are normal.  Neck: Neck supple. No JVD present. No thyromegaly present.  Cardiovascular: Normal rate, regular rhythm and intact distal pulses.   Respiratory: Effort normal and breath sounds normal. No respiratory distress. Elizabeth [name] has no wheezes.  GI: Soft. Bowel sounds are normal. Elizabeth [name] exhibits no distension. There is no tenderness.    Musculoskeletal: Elizabeth [name] exhibits no edema.  Able to move all extremities  Left bka   Lymphadenopathy:    Elizabeth [name] has no cervical adenopathy.  Neurological: Elizabeth [name] is alert.  Skin: Skin is warm and dry. Elizabeth [name] is not diaphoretic.  Psychiatric: Elizabeth [name] has a normal mood and affect.  Sacral wound being followed by wound clinic     ASSESSMENT/ PLAN:  1.  ESRD: is followed by nephrology; is on hemodialysis three days per week; is on sevelamer 800 mg with meals; has 1200 cc fluid restriction  2. PAD is status post left bka; has percocet 5/325 mg every 8 hours as needed for pain   3. Chronic systolic heart failure: will  asa 81 mg daily will monitor is on 1200 cc fluid restriction   4. Diabetes:  her hgb a1c is 7.7  Her readings are variable. Will  continue  lantus 3 units nightly will continue novolog to 3 units with breakfast; 5 units with lunch and 7 units with supper   5. Dyslipidemia: will continue pravachol 20 mg daily ldl is 44   6. Gerd: will continue protonix 40 mg daily   7. Protein calorie malnutrition: will continue prostat 30 cc three times daily her albumin is 2.1   8. Constipation will continue senna s twice daily takes florastor twice daily    Ok Edwards NP [facility]  Contact [phone] Monday through Friday 8am- 5pm  After hours call [phone]

## 2014-12-28 ENCOUNTER — Encounter: Payer: Self-pay | Admitting: Adult Health

## 2014-12-28 NOTE — Progress Notes (Signed)
Patient ID: Elizabeth [name], female   DOB: 1945/05/14, 69 y.o.   MRN: [id number]    Facility: [facility]      No Known Allergies  Chief Complaint  Patient presents with  . Acute Visit    wound management     HPI:  She has seen Dr. Migdalia Dk this past Wednesday for her sacral wound for debridement. There is a dressing in place with sutures over the wound bed. There are two large fluid filled bullae present and are tender to touch. The staff has asked me to come and see her wound. There are no reports of fevers present.      Past Medical History  Diagnosis Date  . Coronary artery disease, non-occlusive January 2016    Moderate p&mLAD & Ostial OM; Myoview Nuclear Stress Test 03/21/2014: EF 35%. Mild hypokinesis of the inferior wall with fixed inferior defect suggesting scar. (no culprit lesion on CATH)  . 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 Critical lower limb ischemia     Status post left BKA following left popliteal and peroneal artery endarterectomy with patch angioplasty.  . Diabetic neuropathy ([place])   . Essential hypertension   . Hyperlipidemia with target LDL less than 70   . History of CVA (cerebrovascular accident) 08/19/2013    "minor one"  . ESRD (end stage renal disease) ([place])     [address], [place], [place] (06/12/2014)  . Urinary retention     DX NEUROGENIC BLADDER--HAS INDWELLING FOLEY CATHETER  . Foley catheter in place     for urinary retention  . Anemia   . Hematemesis April 2016    Hospitalized  . Arthritis of knee, right     "right knee" (06/12/2014)  . Gout     "was in my LLE"  . Diabetes mellitus, insulin dependent (IDDM), uncontrolled ([place])     Brittle; has h/o Hypo & Hyperglycemic episodes, Type II    Past Surgical History  Procedure Laterality Date  .  Insertion of suprapubic catheter N/A 05/16/2012    Procedure: INSERTION OF SUPRAPUBIC CATHETER;  Surgeon: Alexis Frock, MD;  Location: [facility];  Service: Urology;  Laterality: N/A;  . Bascilic vein transposition Left 10/30/2012    Procedure: LEFT 1ST STAGE BASCILIC VEIN TRANSPOSITION;  Surgeon: Angelia Mould, MD;  Location: [place];  Service: Vascular;  Laterality: Left;  . Nm myocar perf wall motion  04/19/2012/ 02/2014    a) low risk study-EF 44%,mild inferolateral hypkinesis; b) EF 35%. Mild hypokinesis of the inferior wall with fixed inferior defect suggesting scar.  . Bascilic vein transposition Left 02/05/2013    Procedure: BASCILIC VEIN TRANSPOSITION 2ND STAGE;  Surgeon: Angelia Mould, MD;  Location: [place];  Service: Vascular;  Laterality: Left;  . Radiology with anesthesia N/A 10/25/2013    Procedure: RADIOLOGY WITH ANESTHESIA;  Surgeon: Medication Radiologist, MD;  Location: [place];  Service: Radiology;  Laterality: N/A;  . Fistulogram Left 04/29/2013    Procedure: FISTULOGRAM;  Surgeon: Angelia Mould, MD;  Location: [facility] CATH LAB;  Service: Cardiovascular;  Laterality: Left;  . Angioplasty Left 04/29/2013    Procedure: ANGIOPLASTY;  Surgeon: Angelia Mould, MD;  Location: [facility] CATH LAB;  Service: Cardiovascular;  Laterality: Left;  . Abdominal aortagram N/A 03/19/2014    Procedure: ABDOMINAL Maxcine Ham;  Surgeon: Elam Dutch, MD;  Location: [place] CATH LAB;  Service: Cardiovascular;  Laterality: N/A;  . Left and right heart catheterization with coronary angiogram N/A 03/24/2014    Procedure: LEFT AND RIGHT HEART CATHETERIZATION WITH CORONARY ANGIOGRAM;  Surgeon: Leonie Man, MD;  Location: [facility] CATH LAB;  Service: Cardiovascular; Moderate disease in p-mLAD & Ostial OM1; normal right heart pressures. Wedge pressure 29 mmHg. --No lesion to explain inferior defect on Myoview and echocardiogram.   . Endarterectomy popliteal Left 03/26/2014    Procedure: Left popliteal and  peroneal artery endarterectomy with vein patch angioplasty;  Surgeon: Elam Dutch, MD;  Location: [place];  Service: Vascular;  Laterality: Left;  . Amputation Left 03/26/2014    Procedure: AMPUTATION SECOND LEFT TOE;  Surgeon: Elam Dutch, MD;  Location: [place];  Service: Vascular;  Laterality: Left;  . Av fistula placement Left 04/28/2014    Procedure: CREATION OF LEFT BRACHIOCEPHALIC  ARTERIOVENOUS (AV) FISTULA CREATION;  Surgeon: Conrad Wright, MD;  Location: [place];  Service: Vascular;  Laterality: Left;  . Amputation Left 05/10/2014    Procedure: AMPUTATION BELOW KNEE;  Surgeon: Rosetta Posner, MD;  Location: [facility] OR;  Service: Vascular;  Laterality: Left;  . Esophagogastroduodenoscopy N/A 06/13/2014    Procedure: ESOPHAGOGASTRODUODENOSCOPY (EGD);  Surgeon: Carol Ada, MD;  Location: [facility] ENDOSCOPY;  Service: Endoscopy;  Laterality: N/A;  . Transthoracic echocardiogram  03/22/2014    EF 30-35%, mild LVH. Severe inferior hypokinesis. Mild MR.  Marland Kitchen Below knee leg amputation Left   . Fistulogram Left 10/08/2014    Procedure: LEFT ARM FISTULOGRAM;  Surgeon: Conrad Iroquois, MD;  Location: [place];  Service: Vascular;  Laterality: Left;  . Venoplasty Left 10/08/2014    Procedure: LEFT CEPHALIC VEIN VENOPLASTY;  Surgeon: Conrad New Morgan, MD;  Location: [place];  Service: Vascular;  Laterality: Left;  . Incision and drainage of wound N/A 10/29/2014    Procedure: IRRIGATION AND DEBRIDEMENT OF SACRAL WOUND, PLACEMENT OF A-CELL;  Surgeon: Theodoro Kos, DO;  Location: [place];  Service: Plastics;  Laterality: N/A;  . Application of a-cell of extremity N/A 10/29/2014    Procedure: PLACEMENT OF APPLICATION OF A-CELL;  Surgeon: Theodoro Kos, DO;  Location: [place];  Service: Plastics;  Laterality: N/A;  . Esophageal manometry N/A 11/24/2014    Procedure: ESOPHAGEAL MANOMETRY (EM);  Surgeon: Carol Ada, MD;  Location: [facility];  Service: Endoscopy;  Laterality: N/A;    VITAL SIGNS BP 105/57 mmHg  Pulse 100  Ht 5' 3"   (1.6 m)  Wt 84 lb (38.102 kg)  BMI 14.88 kg/m2  Patient's Medications  New Prescriptions   No medications on file  Previous Medications   AMINO ACIDS-PROTEIN HYDROLYS (FEEDING SUPPLEMENT, PRO-STAT SUGAR FREE 64,) LIQD    Take 30 mLs by mouth 3 (three) times daily with meals.   ASPIRIN EC 81 MG TABLET    Take 81 mg by mouth daily.   BESIFLOXACIN HCL (BESIVANCE) 0.6 % SUSP    Place 1 drop into the right eye 3 (three) times daily.    CETIRIZINE (ZYRTEC) 10 MG TABLET    Take 10 mg by mouth daily as needed for allergies.    DARBEPOETIN ALFA (ARANESP) 200 MCG/0.4ML SOSY INJECTION    Inject 0.4 mLs (200 mcg total) into the vein every Thursday with hemodialysis.   DIFLUPREDNATE (DUREZOL) 0.05 % EMUL    Place 1 drop into the right eye See admin instructions. Instill one drop into right eye 3 times daily on 10/08/14 (one day)  DOCUSATE SODIUM (COLACE) 50 MG CAPSULE    Take 1 capsule (50 mg total) by mouth 2 (two) times daily. Hold for diarrhea   DOXERCALCIFEROL (HECTOROL) 4 MCG/2ML INJECTION    Inject 0.5 mLs (1 mcg total) into the vein Every Tuesday,Thursday,and Saturday with dialysis.   INSULIN ASPART (NOVOLOG) 100 UNIT/ML INJECTION    Inject 0-9 Units into the skin 3 (three) times daily with meals. Sliding scale CBG 70 - 120: 0 units CBG 121 - 150: 1 unit,  CBG 151 - 200: 2 units,  CBG 201 - 250: 3 units,  CBG 251 - 300: 5 units,  CBG 301 - 350: 7 units,  CBG 351 - 400: 9 units   CBG > 400: 9 units and notify your MD   INSULIN GLARGINE (LANTUS) 100 UNIT/ML INJECTION    Inject 0.03 mLs (3 Units total) into the skin daily.   LIDOCAINE-PRILOCAINE (EMLA) CREAM    Apply 1 application topically 3 (three) times a week. apply to dialysis site every Tuesday, Thursday, Saturday prior to dialysis   MULTIPLE VITAMINS-MINERALS (DECUBI-VITE) CAPS    Take 2 capsules by mouth daily.   NEPAFENAC (ILEVRO) 0.3 % OPHTHALMIC SUSPENSION    Place 1 drop into both eyes See admin instructions. Pt instills one drop into left at  bedtime every Monday, Tuesday and Wednesday - pt instills one drop into right eye every night at bedtime   NUTRITIONAL SUPPLEMENTS (FEEDING SUPPLEMENT, NEPRO CARB STEADY,) LIQD    Take 237 mLs by mouth 2 (two) times daily between meals.   OXYCODONE-ACETAMINOPHEN (PERCOCET/ROXICET) 5-325 MG PER TABLET    Take one tablet by mouth every 8 hours as needed for pain. Not to exceed 3gm APAP from all sources/24 hours   PANTOPRAZOLE (PROTONIX) 40 MG TABLET    Take 1 tablet (40 mg total) by mouth daily.   PRAVASTATIN (PRAVACHOL) 20 MG TABLET    Take 20 mg by mouth every evening.    PROMETHAZINE (PHENERGAN) 25 MG TABLET    Take 25 mg by mouth every 6 (six) hours as needed for nausea or vomiting.   SACCHAROMYCES BOULARDII (FLORASTOR) 250 MG CAPSULE    Take 1 capsule (250 mg total) by mouth 2 (two) times daily.   SENNA-DOCUSATE (SENOKOT-S) 8.6-50 MG PER TABLET    Take 1 tablet by mouth 2 (two) times daily.   SEVELAMER CARBONATE (RENVELA) 800 MG TABLET    Take 1 tablet (800 mg total) by mouth 3 (three) times daily with meals.  Modified Medications   No medications on file  Discontinued Medications   No medications on file     SIGNIFICANT DIAGNOSTIC EXAMS   05-04-14: chest x-ray: mild chf with bilateral air space disease and small bilateral pleural effusion   05-09-14: chest x-ray: 1. Stable mild cardiomegaly. 2. Decreased pleural effusions and improved aeration.  05-09-14: left foot x-ray: Acute osteomyelitis involving in the residual 2nd metatarsal, 3rd metatarsal head, and 3rd proximal phalanx.Associated pathologic fracture of the 3rd proximal phalanx.     LABS REVIEWED:   04-01-14: wbc 12.1; hgb 9.6; hct 34.7; mcv 92.4; plt 434 05-02-14: urine culture: neg  05-09-14: wbc 8.5; hgb 11.;4 hct 37.1; mcv 87.1 plt 252; glucose 299; bun 43; creat 3.49; k+ 3.7; na++135; liver normal albumin 2.1; hgb a1c 7.0; tsh 1.242 05-11-14: wbc 9.7; hgb 10.8; hct 37.1; mcv 90.3; plt 213; glucose 114; bun 24; creat 3.12;  k+4.5; na++136  05-12-14: wbc 11.9; hgb 10.4; hct 33.8; mcv 87.3 plt 225; glucose 151; bun  52; creat 4.52; k+5.0; na++135  06-28-14: wbc 5.9; hgb 11.8; hct 37.7; mcv 84.2; plt 103; glucose 86; bun 52; creat 3.74; k+5.5; na++134; albumin 2.2; phos 5.4  08-13-14: wbc 5.7 hgb 12.5 ;hct 40.5; mcv 84.6; plt 153; glucose 126; bun 80; creat 5.22; k+ 5.3; na++134; alk phos 145; ast 60; alt 83; albumin 3.0; hgb a1c 7.7; chol 146; ;ldl 44; trig 74; hdl 40      Review of Systems Constitutional: Negative for appetite change and fatigue.  HENT: Negative for congestion.   Respiratory: Negative for cough, chest tightness and shortness of breath.   Cardiovascular: Negative for chest pain, palpitations and leg swelling.  Gastrointestinal: negative for nausea, vomiting, diarrhea   Musculoskeletal: Negative for myalgias and arthralgias.  Skin: Negative for pallor.  Neurological: Negative for dizziness.  Psychiatric/Behavioral: The patient is not nervous/anxious.   Physical Exam Constitutional: No distress.  Frail   Eyes: Conjunctivae are normal.  Neck: Neck supple. No JVD present. No thyromegaly present.  Cardiovascular: Normal rate, regular rhythm and intact distal pulses.   Respiratory: Effort normal and breath sounds normal. No respiratory distress. She has no wheezes.  GI: Soft. Bowel sounds are normal. She exhibits no distension. There is no tenderness.    Musculoskeletal: She exhibits no edema.  Able to move all extremities  Left bka   Lymphadenopathy:    She has no cervical adenopathy.  Neurological: She is alert.  Skin: Skin is warm and dry. She is not diaphoretic.  Psychiatric: She has a normal mood and affect.  Sacral wound being followed by wound clinic has sutured on wound dressing.  No signs of infection present in the wound bed 2 large fluid filled bullae present tender to touch.       ASSESSMENT/ PLAN:  1. Sacral wound: will use skin prep to the bullae. Will continue to be  followed by Dr. Migdalia Dk. I have placed out a call to Dr. Migdalia Dk regarding her wound and am awaiting a return call. Will continue to monitor her status.     Ok Edwards NP [facility]  Contact [phone] Monday through Friday 8am- 5pm  After hours call [phone]

## 2015-01-09 ENCOUNTER — Telehealth: Payer: Self-pay | Admitting: Endocrinology

## 2015-01-09 NOTE — Telephone Encounter (Signed)
Please see below.

## 2015-01-09 NOTE — Telephone Encounter (Signed)
Patient ask if the paper work have been filled out for her diabetic shoes, its been about a month now, please advise

## 2015-01-12 NOTE — Telephone Encounter (Signed)
done

## 2015-01-23 ENCOUNTER — Encounter: Payer: Self-pay | Admitting: Endocrinology

## 2015-01-23 ENCOUNTER — Ambulatory Visit (INDEPENDENT_AMBULATORY_CARE_PROVIDER_SITE_OTHER): Payer: Medicare Other | Admitting: Endocrinology

## 2015-01-23 VITALS — BP 116/72 | HR 120 | Temp 98.3°F | Resp 14

## 2015-01-23 DIAGNOSIS — E1042 Type 1 diabetes mellitus with diabetic polyneuropathy: Secondary | ICD-10-CM

## 2015-01-23 DIAGNOSIS — I2510 Atherosclerotic heart disease of native coronary artery without angina pectoris: Secondary | ICD-10-CM

## 2015-01-23 DIAGNOSIS — E1065 Type 1 diabetes mellitus with hyperglycemia: Secondary | ICD-10-CM | POA: Diagnosis not present

## 2015-01-23 DIAGNOSIS — Z89512 Acquired absence of left leg below knee: Secondary | ICD-10-CM | POA: Diagnosis not present

## 2015-01-23 NOTE — Patient Instructions (Signed)
Novolog 4 at bfst

## 2015-01-23 NOTE — Progress Notes (Signed)
Patient ID: Elizabeth [name], female   DOB: 05-14-1945, 69 y.o.   MRN: [id number]    Reason for Appointment: Follow-up for Diabetes  Referring physician: Lerry Liner  History of Present Illness:          Diagnosis: Type 2 diabetes mellitus, date of diagnosis: 1996        Past history:  She was initially treated with metformin and glyburide and her detailed history or level of control is not available She was switched to insulin probably in 2013 She had been taking a regimen of Lantus at night and NovoLog before meals prior to her initial consultation  Recent history:   INSULIN regimen is described as: Lantus 6 units in the morning, 3 units at bedtime NovoLog 3-5-8 units at meals  She is in a rehabilitation nursing facility  On her last visit her blood sugars were not assessed because of difficulty in interpreting her nursing home record which were not sorted out  Previously she was not taking Lantus twice a day with 3 units at night but now this appears to have been restarted Her Novolog was continued unchanged and takes relatively large doses at suppertime  Her A1c tends to be lower than expected possibly because of her anemia and end-stage renal disease  Current blood sugar patterns and problems with management:  Not clear if her blood sugar is being checked after supper but patient thinks it is being done  On her previous visit she was stating that her Lantus was not being given at consistent times but now it is better  Blood sugars are better controlled now with increasing Lantus to twice a day regimen  FASTING blood sugars are still quite variable although recently not consistently high; No hypoglycemia with only one at sugar in the 60s in the last month  She does tend to have somewhat higher readings at lunchtime and is not being checked consistently because of going for dialysis on some days  Sugar at suppertime are in general fairly good but has some  relatively high readings and a couple of readings over 200 in the last 2 weeks  No hypoglycemia recorded on her nursing home record  Unclear what kind of diet she is getting  She is still  came in a wheelchair again today although she thinks she is starting to walk with a prosthesis  Glucose monitoring: 3-4 times a day Glucometer:  unknown  Blood sugars reviewed from nursing home MAR, some readings are not legible  PRE-MEAL Fasting Lunch Dinner Bedtime Overall  Glucose range:  64-210   131-288   70-306  ?     Mean/median:     ?       Glycemic control:    Lab Results  Component Value Date   HGBA1C 7 11/24/2014   HGBA1C 6.7* 06/26/2014   HGBA1C 6.7* 06/12/2014   Lab Results  Component Value Date   LDLCALC 76 03/21/2014   CREATININE 5.49* 08/13/2014     Self-care: The diet that the patient has been following is: None       Meals: 3 meals per day. Breakfast is variable,  lunch is a sandwich and ginger ale. Mixed meal at dinner, snacks are sugar-free desserts, fruit or applesauce         Exercise: None           Dietician visit: Most recent: Several years ago.  consultation with CDE in 09/2013  Weight history:  Wt Readings from Last 3 Encounters:  11/24/14 84 lb (38.102 kg)  11/07/14 84 lb (38.102 kg)  11/03/14 84 lb (38.102 kg)       Medication List       This list is accurate as of: 01/23/15 11:59 PM.  Always use your most recent med list.               aspirin EC 81 MG tablet  Take 81 mg by mouth daily.     BESIVANCE 0.6 % Susp  Generic drug:  Besifloxacin HCl  Place 1 drop into the right eye 3 (three) times daily.     cetirizine 10 MG tablet  Commonly known as:  ZYRTEC  Take 10 mg by mouth daily as needed for allergies.     Darbepoetin Alfa 200 MCG/0.4ML Sosy injection  Commonly known as:  ARANESP  Inject 0.4 mLs (200 mcg total) into the vein every Thursday with hemodialysis.     DECUBI-VITE Caps  Take 2 capsules by mouth daily.      docusate sodium 50 MG capsule  Commonly known as:  COLACE  Take 1 capsule (50 mg total) by mouth 2 (two) times daily. Hold for diarrhea     doxercalciferol 4 MCG/2ML injection  Commonly known as:  HECTOROL  Inject 0.5 mLs (1 mcg total) into the vein Every Tuesday,Thursday,and Saturday with dialysis.     DUREZOL 0.05 % Emul  Generic drug:  Difluprednate  Place 1 drop into the right eye See admin instructions. Instill one drop into right eye 3 times daily on 10/08/14 (one day)     feeding supplement (NEPRO CARB STEADY) Liqd  Take 237 mLs by mouth 2 (two) times daily between meals.     feeding supplement (PRO-STAT SUGAR FREE 64) Liqd  Take 30 mLs by mouth 3 (three) times daily with meals.     glucose blood test strip  Commonly known as:  ONETOUCH VERIO  Use as instructed to check blood sugar 4 times per day dx code E13.10     insulin aspart 100 UNIT/ML injection  Commonly known as:  novoLOG  Inject 0-9 Units into the skin 3 (three) times daily with meals. Sliding scale CBG 70 - 120: 0 units CBG 121 - 150: 1 unit,  CBG 151 - 200: 2 units,  CBG 201 - 250: 3 units,  CBG 251 - 300: 5 units,  CBG 301 - 350: 7 units,  CBG 351 - 400: 9 units   CBG > 400: 9 units and notify your MD     insulin glargine 100 UNIT/ML injection  Commonly known as:  LANTUS  Inject 0.03 mLs (3 Units total) into the skin daily.     lidocaine-prilocaine cream  Commonly known as:  EMLA  Apply 1 application topically 3 (three) times a week. apply to dialysis site every Tuesday, Thursday, Saturday prior to dialysis     nepafenac 0.3 % ophthalmic suspension  Commonly known as:  ILEVRO  Place 1 drop into both eyes See admin instructions. Pt instills one drop into left at bedtime every Monday, Tuesday and Wednesday - pt instills one drop into right eye every night at bedtime     ONETOUCH DELICA LANCETS FINE Misc  Use to check blood sugar 4 times per day     oxyCODONE-acetaminophen 5-325 MG tablet  Commonly known as:   PERCOCET/ROXICET  Take one tablet by mouth every 8 hours as needed for pain. Not to exceed 3gm APAP  from all sources/24 hours     pantoprazole 40 MG tablet  Commonly known as:  PROTONIX  Take 1 tablet (40 mg total) by mouth daily.     pravastatin 20 MG tablet  Commonly known as:  PRAVACHOL  Take 20 mg by mouth every evening.     promethazine 25 MG tablet  Commonly known as:  PHENERGAN  Take 25 mg by mouth every 6 (six) hours as needed for nausea or vomiting.     saccharomyces boulardii 250 MG capsule  Commonly known as:  FLORASTOR  Take 1 capsule (250 mg total) by mouth 2 (two) times daily.     senna-docusate 8.6-50 MG tablet  Commonly known as:  Senokot-S  Take 1 tablet by mouth 2 (two) times daily.     sevelamer carbonate 800 MG tablet  Commonly known as:  RENVELA  Take 1 tablet (800 mg total) by mouth 3 (three) times daily with meals.        Allergies: No Known Allergies  Past Medical History  Diagnosis Date  . Coronary artery disease, non-occlusive January 2016    Moderate p&mLAD & Ostial OM; Myoview Nuclear Stress Test 03/21/2014: EF 35%. Mild hypokinesis of the inferior wall with fixed inferior defect suggesting scar. (no culprit lesion on CATH)  . 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 Critical lower limb ischemia     Status post left BKA following left popliteal and peroneal artery endarterectomy with patch angioplasty.  . Diabetic neuropathy (HCC)   . Essential hypertension   . Hyperlipidemia with target LDL less than 70   . History of CVA (cerebrovascular accident) 08/19/2013    "minor one"  . ESRD (end stage renal disease) (HCC)     [address], [place], [place] (06/12/2014)  . Urinary retention     DX NEUROGENIC BLADDER--HAS INDWELLING FOLEY CATHETER  . Foley catheter in place       for urinary retention  . Anemia   . Hematemesis April 2016    Hospitalized  . Arthritis of knee, right     "right knee" (06/12/2014)  . Gout     "was in my LLE"  . Diabetes mellitus, insulin dependent (IDDM), uncontrolled (HCC)     Brittle; has h/o Hypo & Hyperglycemic episodes, Type II    Past Surgical History  Procedure Laterality Date  . Insertion of suprapubic catheter N/A 05/16/2012    Procedure: INSERTION OF SUPRAPUBIC CATHETER;  Surgeon: Sebastian Ache, MD;  Location: [facility] ORS;  Service: Urology;  Laterality: N/A;  . Bascilic vein transposition Left 10/30/2012    Procedure: LEFT 1ST STAGE BASCILIC VEIN TRANSPOSITION;  Surgeon: Chuck Hint, MD;  Location: [facility] OR;  Service: Vascular;  Laterality: Left;  . Nm myocar perf wall motion  04/19/2012/ 02/2014    a) low risk study-EF 44%,mild inferolateral hypkinesis; b) EF 35%. Mild hypokinesis of the inferior wall with fixed inferior defect suggesting scar.  . Bascilic vein transposition Left 02/05/2013    Procedure: BASCILIC VEIN TRANSPOSITION 2ND STAGE;  Surgeon: Chuck Hint, MD;  Location: [facility] OR;  Service: Vascular;  Laterality: Left;  . Radiology with anesthesia N/A 10/25/2013    Procedure: RADIOLOGY WITH ANESTHESIA;  Surgeon: Medication Radiologist, MD;  Location: [facility] OR;  Service: Radiology;  Laterality: N/A;  . Fistulogram Left 04/29/2013    Procedure: FISTULOGRAM;  Surgeon: Chuck Hint, MD;  Location: [facility] CATH LAB;  Service: Cardiovascular;  Laterality: Left;  . Angioplasty Left 04/29/2013    Procedure: ANGIOPLASTY;  Surgeon: Chuck Hint, MD;  Location: [facility] CATH LAB;  Service: Cardiovascular;  Laterality: Left;  . Abdominal aortagram N/A 03/19/2014    Procedure: ABDOMINAL Ronny Flurry;  Surgeon: Sherren Kerns, MD;  Location: [facility] CATH LAB;  Service: Cardiovascular;  Laterality: N/A;  . Left and right heart catheterization with coronary angiogram N/A 03/24/2014    Procedure: LEFT AND RIGHT HEART  CATHETERIZATION WITH CORONARY ANGIOGRAM;  Surgeon: Marykay Lex, MD;  Location: [facility] CATH LAB;  Service: Cardiovascular; Moderate disease in p-mLAD & Ostial OM1; normal right heart pressures. Wedge pressure 29 mmHg. --No lesion to explain inferior defect on Myoview and echocardiogram.   . Endarterectomy popliteal Left 03/26/2014    Procedure: Left popliteal and peroneal artery endarterectomy with vein patch angioplasty;  Surgeon: Sherren Kerns, MD;  Location: [facility] OR;  Service: Vascular;  Laterality: Left;  . Amputation Left 03/26/2014    Procedure: AMPUTATION SECOND LEFT TOE;  Surgeon: Sherren Kerns, MD;  Location: [facility] OR;  Service: Vascular;  Laterality: Left;  . Av fistula placement Left 04/28/2014    Procedure: CREATION OF LEFT BRACHIOCEPHALIC  ARTERIOVENOUS (AV) FISTULA CREATION;  Surgeon: Fransisco Hertz, MD;  Location: [facility] OR;  Service: Vascular;  Laterality: Left;  . Amputation Left 05/10/2014    Procedure: AMPUTATION BELOW KNEE;  Surgeon: Larina Earthly, MD;  Location: [facility] OR;  Service: Vascular;  Laterality: Left;  . Esophagogastroduodenoscopy N/A 06/13/2014    Procedure: ESOPHAGOGASTRODUODENOSCOPY (EGD);  Surgeon: Jeani Hawking, MD;  Location: [facility] ENDOSCOPY;  Service: Endoscopy;  Laterality: N/A;  . Transthoracic echocardiogram  03/22/2014    EF 30-35%, mild LVH. Severe inferior hypokinesis. Mild MR.  Marland Kitchen Below knee leg amputation Left   . Fistulogram Left 10/08/2014    Procedure: LEFT ARM FISTULOGRAM;  Surgeon: Fransisco Hertz, MD;  Location: [facility] OR;  Service: Vascular;  Laterality: Left;  . Venoplasty Left 10/08/2014    Procedure: LEFT CEPHALIC VEIN VENOPLASTY;  Surgeon: Fransisco Hertz, MD;  Location: [facility] OR;  Service: Vascular;  Laterality: Left;  . Incision and drainage of wound N/A 10/29/2014    Procedure: IRRIGATION AND DEBRIDEMENT OF SACRAL WOUND, PLACEMENT OF A-CELL;  Surgeon: Wayland Denis, DO;  Location: [facility] OR;  Service: Plastics;  Laterality: N/A;  . Application of a-cell of extremity N/A 10/29/2014     Procedure: PLACEMENT OF APPLICATION OF A-CELL;  Surgeon: Wayland Denis, DO;  Location: [facility] OR;  Service: Plastics;  Laterality: N/A;  . Esophageal manometry N/A 11/24/2014    Procedure: ESOPHAGEAL MANOMETRY (EM);  Surgeon: Jeani Hawking, MD;  Location: [facility] ENDOSCOPY;  Service: Endoscopy;  Laterality: N/A;    Family History  Problem Relation Age of Onset  . Hyperlipidemia Mother   . Hypertension Mother   . Hodgkin's lymphoma Father   . Heart disease Sister     Social History:  reports that she has never smoked. She has never used smokeless tobacco. She reports that she does not drink alcohol or use illicit drugs.    Review of Systems   Eyes: She has poor vision for unknown reasons       Lipids: . Taking pravastatin 20 mg and followed by PCP, no recent labs available       Lab Results  Component Value Date   CHOL 152 03/21/2014   HDL 54 03/21/2014   LDLCALC 76 03/21/2014  TRIG 109 03/21/2014   CHOLHDL 2.8 03/21/2014                  The blood pressure has been high previously for several years but currently not on medications     She has had end-stage kidney disease on dialysis    Has left-sided leg amputation  She is asking for diabetic shoes again but comes in a wheelchair.  She does have a previous diabetic shoe on her right foot.   She claims she has a prosthesis and is trying to walk around at the nursing home  Physical Examination:  BP 116/72 mmHg  Pulse 120  Temp(Src) 98.3 F (36.8 C)  Resp 14  Ht   Wt   SpO2 95%    Left below-knee amputation present Monofilament sensation decreased in the right distal foot.  Pedal pulse is not palpable.   2 + ankle edema present  ASSESSMENT:  Diabetes, insulin requiring, nonobese  See history of present illness for current management, problems identified in blood sugar patterns Blood sugars are better controlled now with increasing Lantus to twice a day regimen However blood sugars are not being assessed after evening  meal, no records available today She does have some variability in blood sugars at all times especially supper Blood sugars are mostly higher at lunch  NEUROPATHY and history of diabetic foot ulcer as well as peripheral vascular disease leading to amputation   PLAN:   Written instructions given in detail for nursing home for monitoring blood sugars and sending records in a legible form  Increased breakfast Novolog by 1 unit for now  No change in Lantus  To check blood sugars 4 times a day  Will prescribe diabetic shoes after determining if she is walking more than occasionally at the nursing home  Advised her to make sure to have mealtime insulin right before eating consistently as well as Protein at each meal including breakfast  Patient Instructions  Novolog 4 at bfst   Counseling time on subjects discussed above is over 50% of today's 25 minute visit   Bertrum Helmstetter 01/25/2015, 7:27 PM   Note: This office note was prepared with Dragon voice recognition system technology. Any transcriptional errors that result from this process are unintentional.

## 2015-01-30 ENCOUNTER — Encounter: Payer: Self-pay | Admitting: Vascular Surgery

## 2015-02-06 ENCOUNTER — Ambulatory Visit (INDEPENDENT_AMBULATORY_CARE_PROVIDER_SITE_OTHER): Payer: Medicare Other | Admitting: Vascular Surgery

## 2015-02-06 ENCOUNTER — Ambulatory Visit (HOSPITAL_COMMUNITY)
Admission: RE | Admit: 2015-02-06 | Discharge: 2015-02-06 | Disposition: A | Discharge status: Home / Self Care | Payer: Medicare Other | Source: Ambulatory Visit | Attending: Vascular Surgery | Admitting: Vascular Surgery

## 2015-02-06 ENCOUNTER — Encounter: Payer: Self-pay | Admitting: Vascular Surgery

## 2015-02-06 ENCOUNTER — Non-Acute Institutional Stay (SKILLED_NURSING_FACILITY): Payer: Medicare Other | Admitting: Adult Health

## 2015-02-06 VITALS — BP 117/65 | HR 78 | Temp 97.5°F | Resp 16 | Ht 63.0 in | Wt 84.0 lb

## 2015-02-06 DIAGNOSIS — L98429 Non-pressure chronic ulcer of back with unspecified severity: Secondary | ICD-10-CM

## 2015-02-06 DIAGNOSIS — L89154 Pressure ulcer of sacral region, stage 4: Secondary | ICD-10-CM | POA: Diagnosis not present

## 2015-02-06 DIAGNOSIS — I5022 Chronic systolic (congestive) heart failure: Secondary | ICD-10-CM | POA: Diagnosis not present

## 2015-02-06 DIAGNOSIS — Z992 Dependence on renal dialysis: Secondary | ICD-10-CM

## 2015-02-06 DIAGNOSIS — D638 Anemia in other chronic diseases classified elsewhere: Secondary | ICD-10-CM

## 2015-02-06 DIAGNOSIS — I519 Heart disease, unspecified: Secondary | ICD-10-CM

## 2015-02-06 DIAGNOSIS — N186 End stage renal disease: Secondary | ICD-10-CM | POA: Insufficient documentation

## 2015-02-06 DIAGNOSIS — I739 Peripheral vascular disease, unspecified: Secondary | ICD-10-CM

## 2015-02-06 DIAGNOSIS — Z794 Long term (current) use of insulin: Secondary | ICD-10-CM

## 2015-02-06 DIAGNOSIS — E1122 Type 2 diabetes mellitus with diabetic chronic kidney disease: Secondary | ICD-10-CM

## 2015-02-06 DIAGNOSIS — I871 Compression of vein: Secondary | ICD-10-CM

## 2015-02-06 DIAGNOSIS — I2510 Atherosclerotic heart disease of native coronary artery without angina pectoris: Secondary | ICD-10-CM | POA: Diagnosis not present

## 2015-02-06 DIAGNOSIS — E43 Unspecified severe protein-calorie malnutrition: Secondary | ICD-10-CM

## 2015-02-06 DIAGNOSIS — E785 Hyperlipidemia, unspecified: Secondary | ICD-10-CM

## 2015-02-06 DIAGNOSIS — Z89512 Acquired absence of left leg below knee: Secondary | ICD-10-CM

## 2015-02-06 NOTE — Progress Notes (Signed)
Established Dialysis Access  History of Present Illness  Elizabeth [name] is a 69 y.o. (08/23/1945) female who presents for re-evaluation of left brachiocephalic arteriovenous fistula.  This fistula has had repeated venoplasty of the proximal cephalic vein.  Most recently IN reportedly performed a venoplasty two days ago.  The patient denies any steal sx.  She has not gone to HD since last Saturday  The patient's PMH, PSH, SH, and FamHx are unchanged from 11/07/14.  Current Outpatient Prescriptions  Medication Sig Dispense Refill  . aspirin EC 81 MG tablet Take 81 mg by mouth daily.    . cetirizine (ZYRTEC) 10 MG tablet Take 10 mg by mouth daily as needed for allergies.     Marland Kitchen insulin aspart (NOVOLOG) 100 UNIT/ML injection Inject 0-9 Units into the skin 3 (three) times daily with meals. Sliding scale CBG 70 - 120: 0 units CBG 121 - 150: 1 unit,  CBG 151 - 200: 2 units,  CBG 201 - 250: 3 units,  CBG 251 - 300: 5 units,  CBG 301 - 350: 7 units,  CBG 351 - 400: 9 units   CBG > 400: 9 units and notify your MD (Patient taking differently: Inject 3-6 Units into the skin See admin instructions. Pt injects 3 units in the morning, 5 units before lunch and 6 units in the evening) 10 mL 11  . insulin glargine (LANTUS) 100 UNIT/ML injection Inject 0.03 mLs (3 Units total) into the skin daily. (Patient taking differently: Inject 3-6 Units into the skin See admin instructions. Pt injects 6 units in the morning and 3 units at bedtime) 10 mL 11  . Multiple Vitamins-Minerals (DECUBI-VITE) CAPS Take 2 capsules by mouth daily.    Marland Kitchen oxyCODONE-acetaminophen (PERCOCET/ROXICET) 5-325 MG per tablet Take one tablet by mouth every 8 hours as needed for pain. Not to exceed 3gm APAP from all sources/24 hours (Patient taking differently: Take 1 tablet by mouth every 8 (eight) hours as needed for moderate pain or severe pain. . Not to exceed 3gm APAP from all sources/24 hours) 90 tablet 0  . pantoprazole (PROTONIX) 40 MG tablet  Take 1 tablet (40 mg total) by mouth daily.    . pravastatin (PRAVACHOL) 20 MG tablet Take 20 mg by mouth every evening.     . promethazine (PHENERGAN) 25 MG tablet Take 25 mg by mouth every 6 (six) hours as needed for nausea or vomiting.    . senna-docusate (SENOKOT-S) 8.6-50 MG per tablet Take 1 tablet by mouth 2 (two) times daily.    . sevelamer carbonate (RENVELA) 800 MG tablet Take 1 tablet (800 mg total) by mouth 3 (three) times daily with meals.    . Amino Acids-Protein Hydrolys (FEEDING SUPPLEMENT, PRO-STAT SUGAR FREE 64,) LIQD Take 30 mLs by mouth 3 (three) times daily with meals. Reported on 02/06/2015    . Besifloxacin HCl (BESIVANCE) 0.6 % SUSP Place 1 drop into the right eye 3 (three) times daily. Reported on 02/06/2015    . Darbepoetin Alfa (ARANESP) 200 MCG/0.4ML SOSY injection Inject 0.4 mLs (200 mcg total) into the vein every Thursday with hemodialysis. (Patient not taking: Reported on 02/06/2015) 1.68 mL   . Difluprednate (DUREZOL) 0.05 % EMUL Place 1 drop into the right eye See admin instructions. Reported on 02/06/2015    . docusate sodium (COLACE) 50 MG capsule Take 1 capsule (50 mg total) by mouth 2 (two) times daily. Hold for diarrhea (Patient not taking: Reported on 02/06/2015)    . doxercalciferol (HECTOROL)  4 MCG/2ML injection Inject 0.5 mLs (1 mcg total) into the vein Every Tuesday,Thursday,and Saturday with dialysis. (Patient not taking: Reported on 02/06/2015) 2 mL   . glucose blood (ONETOUCH VERIO) test strip Use as instructed to check blood sugar 4 times per day dx code E13.10 (Patient not taking: Reported on 02/06/2015) 150 each 3  . lidocaine-prilocaine (EMLA) cream Apply 1 application topically 3 (three) times a week. Reported on 02/06/2015    . nepafenac (ILEVRO) 0.3 % ophthalmic suspension Place 1 drop into both eyes See admin instructions. Reported on 02/06/2015    . Nutritional Supplements (FEEDING SUPPLEMENT, NEPRO CARB STEADY,) LIQD Take 237 mLs by mouth 2 (two)  times daily between meals. (Patient not taking: Reported on 02/06/2015)    . ONETOUCH DELICA LANCETS FINE MISC Use to check blood sugar 4 times per day (Patient not taking: Reported on 02/06/2015) 200 each 3  . saccharomyces boulardii (FLORASTOR) 250 MG capsule Take 1 capsule (250 mg total) by mouth 2 (two) times daily. (Patient not taking: Reported on 02/06/2015)     No current facility-administered medications for this visit.    No Known Allergies  On ROS today: no steal, recent sacral decubitus ulcer debridement   Physical Examination  Filed Vitals:   02/06/15 1232  BP: 117/65  Pulse: 78  Temp: 97.5 F (36.4 C)  TempSrc: Oral  Resp: 16  Height: 5\' 3"  (1.6 m)  Weight: 84 lb (38.102 kg)  SpO2: 100%   Body mass index is 14.88 kg/(m^2).  General: A&O x 3, WD, Cachectic   Pulmonary: Sym exp, good air movt, CTAB, no rales, rhonchi, & wheezing  Cardiac: RRR, Nl S1, S2, no Murmurs, rubs or gallops  Vascular: Vessel Right Left  Radial Palpable Faintly Palpable  Ulnar Not Palpable Not Palpable  Brachial Palpable Palpable   Gastrointestinal: soft, NTND, no G/R, bo HSM, no masses, no CVAT B  Musculoskeletal: M/S 5/5 throughout , Extremities without  ischemic changes , palpable thrill in access in L upper arm, + bruit in access, access site evident in distal upper arm  Neurologic: Pain and light touch intact in extremities Motor exam as listed above  Non-Invasive Vascular Imaging  left arm Access Duplex  (Date: 02/06/2015):   Diameters:  1.4-10.0 mcm  Depth:  1.2-8.9 mm  PSV:  540 c/s at area of distal stenosis (perianastomotic)  Mobile thrombus visualized in proximal fistula   Medical Decision Making  Elizabeth [name] is a 69 y.o. female who presents with ESRD requiring hemodialysis, distal cephalic vein thrombus.    Patient need percutaneous thrombolysis vs open thrombectomy of the left brachiocephalic arteriovenous fistula.  IR unable to accommodate the  patient today.  Will refer patient back to Interventional Nephrology for management of mobile thrombus  If IN unable to accommodate the patient, will plan on surgical thrombectomy later today.   Leonides Sake, MD [facility] Office: [phone] Pager: [phone]  02/06/2015, 12:54 PM  Addendum  IN has agreed to see that patient today.  Leonides Sake, MD [facility] Office: [phone] Pager: [phone]  02/06/2015, 1:32 PM

## 2015-02-09 ENCOUNTER — Encounter (HOSPITAL_BASED_OUTPATIENT_CLINIC_OR_DEPARTMENT_OTHER): Payer: Medicare Other | Attending: Plastic Surgery

## 2015-02-09 DIAGNOSIS — E1151 Type 2 diabetes mellitus with diabetic peripheral angiopathy without gangrene: Secondary | ICD-10-CM | POA: Insufficient documentation

## 2015-02-09 DIAGNOSIS — Z872 Personal history of diseases of the skin and subcutaneous tissue: Secondary | ICD-10-CM | POA: Diagnosis not present

## 2015-02-09 DIAGNOSIS — L89153 Pressure ulcer of sacral region, stage 3: Secondary | ICD-10-CM | POA: Insufficient documentation

## 2015-02-16 ENCOUNTER — Encounter: Payer: Self-pay | Admitting: Adult Health

## 2015-02-16 NOTE — Progress Notes (Signed)
Patient ID: Elizabeth [name], female   DOB: 01-17-1946, 69 y.o.   MRN: [id number]    Facility: [facility]      No Known Allergies  Chief Complaint  Patient presents with  . Medical Management of Chronic Issues    HPI:  She is a long term resident of this facility being seen for the management of her chronic illnesses. Her status is without change. Staff reports that she will decline dialysis at least one time weekly; but she is telling me that she is not missing any treatments. We did discuss the importance of the treatments. She has seen GI for achalasia and she tells me that she will be scheduled for myotomy.     Past Medical History  Diagnosis Date  . Coronary artery disease, non-occlusive January 2016    Moderate p&mLAD & Ostial OM; Myoview Nuclear Stress Test 03/21/2014: EF 35%. Mild hypokinesis of the inferior wall with fixed inferior defect suggesting scar. (no culprit lesion on CATH)  . 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 Critical lower limb ischemia     Status post left BKA following left popliteal and peroneal artery endarterectomy with patch angioplasty.  . Diabetic neuropathy ([place])   . Essential hypertension   . Hyperlipidemia with target LDL less than 70   . History of CVA (cerebrovascular accident) 08/19/2013    "minor one"  . ESRD (end stage renal disease) ([place])     [address], [place], S (06/12/2014)  . Urinary retention     DX NEUROGENIC BLADDER--HAS INDWELLING FOLEY CATHETER  . Foley catheter in place     for urinary retention  . Anemia   . Hematemesis April 2016    Hospitalized  . Arthritis of knee, right     "right knee" (06/12/2014)  . Gout     "was in my LLE"  . Diabetes mellitus, insulin dependent (IDDM), uncontrolled ([place])     Brittle; has h/o Hypo &  Hyperglycemic episodes, Type II    Past Surgical History  Procedure Laterality Date  . Insertion of suprapubic catheter N/A 05/16/2012    Procedure: INSERTION OF SUPRAPUBIC CATHETER;  Surgeon: Alexis Frock, MD;  [facility];  Service: Urology;  Laterality: N/A;  . Bascilic vein transposition Left 10/30/2012    Procedure: LEFT 1ST STAGE BASCILIC VEIN TRANSPOSITION;  Surgeon: Angelia Mould, MD;  Location: [place];  Service: Vascular;  Laterality: Left;  . Nm myocar perf wall motion  04/19/2012/ 02/2014    a) low risk study-EF 44%,mild inferolateral hypkinesis; b) EF 35%. Mild hypokinesis of the inferior wall with fixed inferior defect suggesting scar.  . Bascilic vein transposition Left 02/05/2013    Procedure: BASCILIC VEIN TRANSPOSITION 2ND STAGE;  Surgeon: Angelia Mould, MD;  Location: [place];  Service: Vascular;  Laterality: Left;  . Radiology with anesthesia N/A 10/25/2013    Procedure: RADIOLOGY WITH ANESTHESIA;  Surgeon: Medication Radiologist, MD;  Location: [place];  Service: Radiology;  Laterality: N/A;  . Fistulogram Left 04/29/2013    Procedure: FISTULOGRAM;  Surgeon: Angelia Mould, MD;  Location: [facility] CATH LAB;  Service: Cardiovascular;  Laterality: Left;  . Angioplasty Left 04/29/2013    Procedure: ANGIOPLASTY;  Surgeon: Angelia Mould, MD;  Location: [facility] CATH LAB;  Service: Cardiovascular;  Laterality: Left;  . Abdominal aortagram N/A 03/19/2014  Procedure: ABDOMINAL AORTAGRAM;  Surgeon: Elam Dutch, MD;  Location: [facility] CATH LAB;  Service: Cardiovascular;  Laterality: N/A;  . Left and right heart catheterization with coronary angiogram N/A 03/24/2014    Procedure: LEFT AND RIGHT HEART CATHETERIZATION WITH CORONARY ANGIOGRAM;  Surgeon: Leonie Man, MD;  Location: [facility] CATH LAB;  Service: Cardiovascular; Moderate disease in p-mLAD & Ostial OM1; normal right heart pressures. Wedge pressure 29 mmHg. --No lesion to explain inferior defect on Myoview and  echocardiogram.   . Endarterectomy popliteal Left 03/26/2014    Procedure: Left popliteal and peroneal artery endarterectomy with vein patch angioplasty;  Surgeon: Elam Dutch, MD;  Location: [place];  Service: Vascular;  Laterality: Left;  . Amputation Left 03/26/2014    Procedure: AMPUTATION SECOND LEFT TOE;  Surgeon: Elam Dutch, MD;  Location: [place];  Service: Vascular;  Laterality: Left;  . Av fistula placement Left 04/28/2014    Procedure: CREATION OF LEFT BRACHIOCEPHALIC  ARTERIOVENOUS (AV) FISTULA CREATION;  Surgeon: Conrad Pana, MD;  Location: [place];  Service: Vascular;  Laterality: Left;  . Amputation Left 05/10/2014    Procedure: AMPUTATION BELOW KNEE;  Surgeon: Rosetta Posner, MD;  Location: [facility] OR;  Service: Vascular;  Laterality: Left;  . Esophagogastroduodenoscopy N/A 06/13/2014    Procedure: ESOPHAGOGASTRODUODENOSCOPY (EGD);  Surgeon: Carol Ada, MD;  Location: [facility] ENDOSCOPY;  Service: Endoscopy;  Laterality: N/A;  . Transthoracic echocardiogram  03/22/2014    EF 30-35%, mild LVH. Severe inferior hypokinesis. Mild MR.  Marland Kitchen Below knee leg amputation Left   . Fistulogram Left 10/08/2014    Procedure: LEFT ARM FISTULOGRAM;  Surgeon: Conrad [place], MD;  Location: [place];  Service: Vascular;  Laterality: Left;  . Venoplasty Left 10/08/2014    Procedure: LEFT CEPHALIC VEIN VENOPLASTY;  Surgeon: Conrad [place], MD;  Location: [place];  Service: Vascular;  Laterality: Left;  . Incision and drainage of wound N/A 10/29/2014    Procedure: IRRIGATION AND DEBRIDEMENT OF SACRAL WOUND, PLACEMENT OF A-CELL;  Surgeon: Theodoro Kos, DO;  Location: [place];  Service: Plastics;  Laterality: N/A;  . Application of a-cell of extremity N/A 10/29/2014    Procedure: PLACEMENT OF APPLICATION OF A-CELL;  Surgeon: Theodoro Kos, DO;  Location: [place];  Service: Plastics;  Laterality: N/A;  . Esophageal manometry N/A 11/24/2014    Procedure: ESOPHAGEAL MANOMETRY (EM);  Surgeon: Carol Ada, MD;  [facility];   Service: Endoscopy;  Laterality: N/A;    VITAL SIGNS BP 99/62 mmHg  Pulse 100  Ht 5' 3"  (1.6 m)  Wt 81 lb 1.6 oz (36.787 kg)  BMI 14.37 kg/m2  Patient's Medications  New Prescriptions   No medications on file  Previous Medications   AMINO ACIDS-PROTEIN HYDROLYS (FEEDING SUPPLEMENT, PRO-STAT SUGAR FREE 64,) LIQD    Take 30 mLs by mouth 3 (three) times daily with meals.    ASPIRIN EC 81 MG TABLET    Take 81 mg by mouth daily.   CETIRIZINE (ZYRTEC) 10 MG TABLET    Take 10 mg by mouth daily as needed for allergies.    DARBEPOETIN ALFA (ARANESP) 200 MCG/0.4ML SOSY INJECTION    Inject 0.4 mLs (200 mcg total) into the vein every Thursday with hemodialysis.   DOCUSATE SODIUM (COLACE) 50 MG CAPSULE    Take 1 capsule (50 mg total) by mouth 2 (two) times daily. Hold for diarrhea   DOXERCALCIFEROL (HECTOROL) 4 MCG/2ML INJECTION    Inject 0.5 mLs (1 mcg total) into the vein  Every Tuesday,Thursday,and Saturday with dialysis.   INSULIN ASPART (NOVOLOG) 100 UNIT/ML INJECTION    3 units breakfast 5 units lunch 8 units supper    INSULIN GLARGINE (LANTUS) 100 UNIT/ML INJECTION    Inject 0.03 mLs (3 Units total) into the skin daily.   LIDOCAINE-PRILOCAINE (EMLA) CREAM    Apply 1 application topically 3 (three) times a week. Reported on 02/06/2015   MULTIPLE VITAMINS-MINERALS (DECUBI-VITE) CAPS    Take 2 capsules by mouth daily.   NUTRITIONAL SUPPLEMENTS (FEEDING SUPPLEMENT, NEPRO CARB STEADY,) LIQD    Take 237 mLs by mouth 2 (two) times daily between meals.   OXYCODONE-ACETAMINOPHEN (PERCOCET/ROXICET) 5-325 MG PER TABLET    Take one tablet by mouth every 8 hours as needed for pain. Not to exceed 3gm APAP from all sources/24 hours   PANTOPRAZOLE (PROTONIX) 40 MG TABLET    Take 1 tablet (40 mg total) by mouth daily.   PRAVASTATIN (PRAVACHOL) 20 MG TABLET    Take 20 mg by mouth every evening.    PROMETHAZINE (PHENERGAN) 25 MG TABLET    Take 25 mg by mouth every 6 (six) hours as needed for nausea or vomiting.     SACCHAROMYCES BOULARDII (FLORASTOR) 250 MG CAPSULE    Take 1 capsule (250 mg total) by mouth 2 (two) times daily.   SENNA-DOCUSATE (SENOKOT-S) 8.6-50 MG PER TABLET    Take 1 tablet by mouth 2 (two) times daily.   SEVELAMER CARBONATE (RENVELA) 800 MG TABLET    Take 1 tablet (800 mg total) by mouth 3 (three) times daily with meals.  Modified Medications   No medications on file  Discontinued Medications   No medications on file     SIGNIFICANT DIAGNOSTIC EXAMS   05-04-14: chest x-ray: mild chf with bilateral air space disease and small bilateral pleural effusion   05-09-14: chest x-ray: 1. Stable mild cardiomegaly. 2. Decreased pleural effusions and improved aeration.  05-09-14: left foot x-ray: Acute osteomyelitis involving in the residual 2nd metatarsal, 3rd metatarsal head, and 3rd proximal phalanx.Associated pathologic fracture of the 3rd proximal phalanx.     LABS REVIEWED:   04-01-14: wbc 12.1; hgb 9.6; hct 34.7; mcv 92.4; plt 434 05-02-14: urine culture: neg  05-09-14: wbc 8.5; hgb 11.;4 hct 37.1; mcv 87.1 plt 252; glucose 299; bun 43; creat 3.49; k+ 3.7; na++135; liver normal albumin 2.1; hgb a1c 7.0; tsh 1.242 05-11-14: wbc 9.7; hgb 10.8; hct 37.1; mcv 90.3; plt 213; glucose 114; bun 24; creat 3.12; k+4.5; na++136  05-12-14: wbc 11.9; hgb 10.4; hct 33.8; mcv 87.3 plt 225; glucose 151; bun 52; creat 4.52; k+5.0; na++135  06-28-14: wbc 5.9; hgb 11.8; hct 37.7; mcv 84.2; plt 103; glucose 86; bun 52; creat 3.74; k+5.5; na++134; albumin 2.2; phos 5.4  08-13-14: wbc 5.7 hgb 12.5 ;hct 40.5; mcv 84.6; plt 153; glucose 126; bun 80; creat 5.22; k+ 5.3; na++134; alk phos 145; ast 60; alt 83; albumin 3.0; hgb a1c 7.7; chol 146; ;ldl 44; trig 74; hdl 40      Review of Systems Constitutional: Negative for appetite change and fatigue.  HENT: Negative for congestion.   Respiratory: Negative for cough, chest tightness and shortness of breath.   Cardiovascular: Negative for chest pain, palpitations  and leg swelling.  Gastrointestinal: negative for nausea, vomiting, diarrhea   Musculoskeletal: Negative for myalgias and arthralgias.  Skin: Negative for pallor.  Neurological: Negative for dizziness.  Psychiatric/Behavioral: The patient is not nervous/anxious.    Physical Exam Constitutional: No distress.  Frail  Eyes: Conjunctivae are normal.  Neck: Neck supple. No JVD present. No thyromegaly present.  Cardiovascular: Normal rate, regular rhythm and intact distal pulses.   Respiratory: Effort normal and breath sounds normal. No respiratory distress. She has no wheezes.  GI: Soft. Bowel sounds are normal. She exhibits no distension. There is no tenderness.    Musculoskeletal: She exhibits no edema.  Able to move all extremities  Left bka   Lymphadenopathy:    She has no cervical adenopathy.  Neurological: She is alert.  Skin: Skin is warm and dry. She is not diaphoretic.  Psychiatric: She has a normal mood and affect.  Sacral wound being followed by wound clinic     ASSESSMENT/ PLAN:  1.  ESRD: is followed by nephrology; is on hemodialysis three days per week; is on sevelamer 800 mg with meals; has 1200 cc fluid restriction  2. PAD is status post left bka; has percocet 5/325 mg every 8 hours as needed for pain   3. Chronic systolic heart failure: will  asa 81 mg daily will monitor is on 1200 cc fluid restriction   4. Diabetes:  her hgb a1c is 7.7  Her readings are variable. Will continue  lantus 3 units nightly will continue novolog to 3 units with breakfast; 5 units with lunch and 8 units with supper   5. Dyslipidemia: will continue pravachol 20 mg daily ldl is 44   6. Gerd: will continue protonix 40 mg daily   7. Protein calorie malnutrition: will continue prostat 30 cc three times daily her albumin is 2.1   8. Constipation will continue senna s twice daily takes florastor twice daily          Ok Edwards NP [facility]  Contact [phone]  Monday through Friday 8am- 5pm  After hours call [phone]

## 2015-02-21 ENCOUNTER — Encounter: Payer: Self-pay | Admitting: Adult Health

## 2015-02-21 NOTE — Progress Notes (Signed)
Patient ID: Elizabeth [name], female   DOB: Sep 01, 1945, 69 y.o.   MRN: [id number]    [facility]      No Known Allergies  Chief Complaint  Patient presents with  . Medical Management of Chronic Issues    HPI:  She is a long term resident of this facility being seen for the management of her chronic illnesses. Her status remains stable. She is not voicing any complaints today. She did tell me that she is going to dialysis on a consistent basis. There are no nursing concerns at this time.  She has been seen by endocrinology on 01-23-15 She has been seen today by vein and vascular     Past Medical History  Diagnosis Date  . Coronary artery disease, non-occlusive January 2016    Moderate p&mLAD & Ostial OM; Myoview Nuclear Stress Test 03/21/2014: EF 35%. Mild hypokinesis of the inferior wall with fixed inferior defect suggesting scar. (no culprit lesion on CATH)  . 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 Critical lower limb ischemia     Status post left BKA following left popliteal and peroneal artery endarterectomy with patch angioplasty.  . Diabetic neuropathy ([place])   . Essential hypertension   . Hyperlipidemia with target LDL less than 70   . History of CVA (cerebrovascular accident) 08/19/2013    "minor one"  . ESRD (end stage renal disease) ([place])     [address], [place], [place] (06/12/2014)  . Urinary retention     DX NEUROGENIC BLADDER--HAS INDWELLING FOLEY CATHETER  . Foley catheter in place     for urinary retention  . Anemia   . Hematemesis April 2016    Hospitalized  . Arthritis of knee, right     "right knee" (06/12/2014)  . Gout     "was in my LLE"  . Diabetes mellitus, insulin dependent (IDDM), uncontrolled ([place])     Brittle; has h/o Hypo & Hyperglycemic episodes, Type II     Past Surgical History  Procedure Laterality Date  . Insertion of suprapubic catheter N/A 05/16/2012    Procedure: INSERTION OF SUPRAPUBIC CATHETER;  Surgeon: Alexis Frock, MD;  [facility];  Service: Urology;  Laterality: N/A;  . Bascilic vein transposition Left 10/30/2012    Procedure: LEFT 1ST STAGE BASCILIC VEIN TRANSPOSITION;  Surgeon: Angelia Mould, MD;  Location: [place];  Service: Vascular;  Laterality: Left;  . Nm myocar perf wall motion  04/19/2012/ 02/2014    a) low risk study-EF 44%,mild inferolateral hypkinesis; b) EF 35%. Mild hypokinesis of the inferior wall with fixed inferior defect suggesting scar.  . Bascilic vein transposition Left 02/05/2013    Procedure: BASCILIC VEIN TRANSPOSITION 2ND STAGE;  Surgeon: Angelia Mould, MD;  Location: [place];  Service: Vascular;  Laterality: Left;  . Radiology with anesthesia N/A 10/25/2013    Procedure: RADIOLOGY WITH ANESTHESIA;  Surgeon: Medication Radiologist, MD;  Location: [place];  Service: Radiology;  Laterality: N/A;  . Fistulogram Left 04/29/2013    Procedure: FISTULOGRAM;  Surgeon: Angelia Mould, MD;  Location: [facility] CATH LAB;  Service: Cardiovascular;  Laterality: Left;  . Angioplasty Left 04/29/2013    Procedure: ANGIOPLASTY;  Surgeon: Angelia Mould, MD;  Location: [facility] CATH LAB;  Service: Cardiovascular;  Laterality: Left;  . Abdominal aortagram N/A 03/19/2014    Procedure: ABDOMINAL  Maxcine Ham;  Surgeon: Elam Dutch, MD;  Location: [facility] CATH LAB;  Service: Cardiovascular;  Laterality: N/A;  . Left and right heart catheterization with coronary angiogram N/A 03/24/2014    Procedure: LEFT AND RIGHT HEART CATHETERIZATION WITH CORONARY ANGIOGRAM;  Surgeon: Leonie Man, MD;  Location: [facility] CATH LAB;  Service: Cardiovascular; Moderate disease in p-mLAD & Ostial OM1; normal right heart pressures. Wedge pressure 29 mmHg. --No lesion to explain inferior defect on Myoview and echocardiogram.   . Endarterectomy  popliteal Left 03/26/2014    Procedure: Left popliteal and peroneal artery endarterectomy with vein patch angioplasty;  Surgeon: Elam Dutch, MD;  Location: [place];  Service: Vascular;  Laterality: Left;  . Amputation Left 03/26/2014    Procedure: AMPUTATION SECOND LEFT TOE;  Surgeon: Elam Dutch, MD;  Location: [place];  Service: Vascular;  Laterality: Left;  . Av fistula placement Left 04/28/2014    Procedure: CREATION OF LEFT BRACHIOCEPHALIC  ARTERIOVENOUS (AV) FISTULA CREATION;  Surgeon: Conrad [place], MD;  Location: [place];  Service: Vascular;  Laterality: Left;  . Amputation Left 05/10/2014    Procedure: AMPUTATION BELOW KNEE;  Surgeon: Rosetta Posner, MD;  Location: [facility] OR;  Service: Vascular;  Laterality: Left;  . Esophagogastroduodenoscopy N/A 06/13/2014    Procedure: ESOPHAGOGASTRODUODENOSCOPY (EGD);  Surgeon: Carol Ada, MD;  Location: [facility] ENDOSCOPY;  Service: Endoscopy;  Laterality: N/A;  . Transthoracic echocardiogram  03/22/2014    EF 30-35%, mild LVH. Severe inferior hypokinesis. Mild MR.  Marland Kitchen Below knee leg amputation Left   . Fistulogram Left 10/08/2014    Procedure: LEFT ARM FISTULOGRAM;  Surgeon: Conrad [place], MD;  Location: [place];  Service: Vascular;  Laterality: Left;  . Venoplasty Left 10/08/2014    Procedure: LEFT CEPHALIC VEIN VENOPLASTY;  Surgeon: Conrad [place], MD;  Location: [place];  Service: Vascular;  Laterality: Left;  . Incision and drainage of wound N/A 10/29/2014    Procedure: IRRIGATION AND DEBRIDEMENT OF SACRAL WOUND, PLACEMENT OF A-CELL;  Surgeon: Theodoro Kos, DO;  Location: [place];  Service: Plastics;  Laterality: N/A;  . Application of a-cell of extremity N/A 10/29/2014    Procedure: PLACEMENT OF APPLICATION OF A-CELL;  Surgeon: Theodoro Kos, DO;  Location: [place];  Service: Plastics;  Laterality: N/A;  . Esophageal manometry N/A 11/24/2014    Procedure: ESOPHAGEAL MANOMETRY (EM);  Surgeon: Carol Ada, MD;  [facility];  Service: Endoscopy;  Laterality: N/A;     VITAL SIGNS BP 109/65 mmHg  Pulse 90  Ht 5' 3"  (1.6 m)  Wt 83 lb (37.649 kg)  BMI 14.71 kg/m2  Patient's Medications  New Prescriptions   No medications on file  Previous Medications   ASPIRIN EC 81 MG TABLET    Take 81 mg by mouth daily.   CETIRIZINE (ZYRTEC) 10 MG TABLET    Take 10 mg by mouth daily as needed for allergies.    INSULIN ASPART (NOVOLOG) 100 UNIT/ML INJECTION 3 units with breakfast 5 units with lunch 8 units with supper    INSULIN GLARGINE (LANTUS) 100 UNIT/ML INJECTION    Inject 0.03 mLs (3 Units total) into the skin daily.   LIDOCAINE-PRILOCAINE (EMLA) CREAM    Apply 1 application topically Every Tuesday,Thursday,and Saturday with dialysis.    MULTIPLE VITAMINS-MINERALS (DECUBI-VITE) CAPS    Take 2 capsules by mouth daily.   OXYCODONE-ACETAMINOPHEN (PERCOCET/ROXICET) 5-325 MG PER TABLET    Take one tablet by mouth every 8 hours as needed for pain. Not to exceed  3gm APAP from all sources/24 hours   PANTOPRAZOLE (PROTONIX) 40 MG TABLET    Take 1 tablet (40 mg total) by mouth daily.   PRAVASTATIN (PRAVACHOL) 20 MG TABLET    Take 20 mg by mouth every evening.    PROMETHAZINE (PHENERGAN) 25 MG TABLET    Take 25 mg by mouth every 6 (six) hours as needed for nausea or vomiting.   SENNA (SENOKOT) 8.6 MG TABS TABLET    Take 1 tablet by mouth 2 (two) times daily.   SEVELAMER CARBONATE (RENVELA) 800 MG TABLET    Take 1 tablet (800 mg total) by mouth 3 (three) times daily with meals.  Modified Medications   No medications on file  Discontinued Medications   No medications on file     SIGNIFICANT DIAGNOSTIC EXAMS   05-04-14: chest x-ray: mild chf with bilateral air space disease and small bilateral pleural effusion   05-09-14: chest x-ray: 1. Stable mild cardiomegaly. 2. Decreased pleural effusions and improved aeration.  05-09-14: left foot x-ray: Acute osteomyelitis involving in the residual 2nd metatarsal, 3rd metatarsal head, and 3rd proximal phalanx.Associated  pathologic fracture of the 3rd proximal phalanx.     LABS REVIEWED:   04-01-14: wbc 12.1; hgb 9.6; hct 34.7; mcv 92.4; plt 434 05-02-14: urine culture: neg  05-09-14: wbc 8.5; hgb 11.;4 hct 37.1; mcv 87.1 plt 252; glucose 299; bun 43; creat 3.49; k+ 3.7; na++135; liver normal albumin 2.1; hgb a1c 7.0; tsh 1.242 05-11-14: wbc 9.7; hgb 10.8; hct 37.1; mcv 90.3; plt 213; glucose 114; bun 24; creat 3.12; k+4.5; na++136  05-12-14: wbc 11.9; hgb 10.4; hct 33.8; mcv 87.3 plt 225; glucose 151; bun 52; creat 4.52; k+5.0; na++135  06-28-14: wbc 5.9; hgb 11.8; hct 37.7; mcv 84.2; plt 103; glucose 86; bun 52; creat 3.74; k+5.5; na++134; albumin 2.2; phos 5.4  08-13-14: wbc 5.7 hgb 12.5 ;hct 40.5; mcv 84.6; plt 153; glucose 126; bun 80; creat 5.22; k+ 5.3; na++134; alk phos 145; ast 60; alt 83; albumin 3.0; hgb a1c 7.7; chol 146; ;ldl 44; trig 74; hdl 40 11-24-14: hgb a1c 7.0       Review of Systems Constitutional: Negative for appetite change and fatigue.  HENT: Negative for congestion.   Respiratory: Negative for cough, chest tightness and shortness of breath.   Cardiovascular: Negative for chest pain, palpitations and leg swelling.  Gastrointestinal: negative for nausea, vomiting, diarrhea   Musculoskeletal: Negative for myalgias and arthralgias.  Skin: Negative for pallor.  Neurological: Negative for dizziness.  Psychiatric/Behavioral: The patient is not nervous/anxious.    Physical Exam Constitutional: No distress.  Frail   Eyes: Conjunctivae are normal.  Neck: Neck supple. No JVD present. No thyromegaly present.  Cardiovascular: Normal rate, regular rhythm and intact distal pulses.   Respiratory: Effort normal and breath sounds normal. No respiratory distress. She has no wheezes.  GI: Soft. Bowel sounds are normal. She exhibits no distension. There is no tenderness.    Musculoskeletal: She exhibits no edema.  Able to move all extremities  Left bka   Lymphadenopathy:    She has no cervical  adenopathy.  Neurological: She is alert.  Skin: Skin is warm and dry. She is not diaphoretic.  Psychiatric: She has a normal mood and affect.  Sacral wound 1.5 x 1.0 x 0.4 cm with 0.9 cm tunnel  being followed by wound clinic Has left upper extremity A/V fistula      ASSESSMENT/ PLAN:  1.  ESRD: is followed by nephrology; is on hemodialysis  three days per week; is on sevelamer 800 mg with meals; has 1200 cc fluid restriction  2. PAD is status post left bka; has percocet 5/325 mg every 8 hours as needed for pain   3. Chronic systolic heart failure: will  asa 81 mg daily will monitor is on 1200 cc fluid restriction   4. Diabetes:  her hgb a1c is 7.0  Her readings are variable. Will continue  lantus 3 units nightly will continue novolog to 3 units with breakfast; 5 units with lunch and 8 units with supper   5. Dyslipidemia: will continue pravachol 20 mg daily ldl is 44   6. Gerd: will continue protonix 40 mg daily   7. Protein calorie malnutrition: will continue prostat 30 cc three times daily her albumin is 2.1   8. Constipation will continue senna s twice daily takes florastor twice daily     Will check cbc; cmp; lipids and hgb a1c     Time spent with patient  45  minutes >50% time spent counseling; reviewing medical record; tests; labs; and developing future plan of care    Ok Edwards NP [facility]  Contact [phone] Monday through Friday 8am- 5pm  After hours call [phone]

## 2015-02-25 ENCOUNTER — Non-Acute Institutional Stay (SKILLED_NURSING_FACILITY): Payer: Medicare Other | Admitting: Adult Health

## 2015-02-25 ENCOUNTER — Encounter (HOSPITAL_COMMUNITY): Payer: Self-pay | Admitting: *Deleted

## 2015-02-25 DIAGNOSIS — L89154 Pressure ulcer of sacral region, stage 4: Secondary | ICD-10-CM

## 2015-02-25 DIAGNOSIS — L98429 Non-pressure chronic ulcer of back with unspecified severity: Secondary | ICD-10-CM

## 2015-02-26 ENCOUNTER — Other Ambulatory Visit: Payer: Self-pay | Admitting: Surgery

## 2015-02-26 NOTE — H&P (Signed)
Elizabeth [name] 12/09/2014 10:39 AM Location: [facility] Patient #: [id number] DOB: 04/20/1945 Single / Language: English / Race: Black or African American Female   History of Present Illness  The patient is a 69 year old female. Patient sent for surgical consultation by Dr. Patrick Hung for concern of worsening achalasia.  Woman with worsening issues of dysphagia. Insulin-requiring diabetes. 8 end-stage renal disease on dialysis Tuesday Thursday Saturday. Peripheral vascular disease. LEFT below the knee amputation. Had an episode of emesis. Possibly coffee ground. Found to have very dilated esophagus by endoscopy. No obstructing mass nor tumor. Barium swallow suspicious for achalasia in April 2014. Dilated esophagus but not sigmoid shaped yet.. Had held off traveling to consider pneumatic dilatation. Recently had manometry that showed type II achalasia with no peristalsis. She does have a history of nonischemic cardiomyopathy. Last ejection fraction 30%. Normal mole Cardy catheterization November 2016. Worsening peripheral vascular disease status post below the knee amputation on LEFT side earlier in the year.  She comes in today by herself in a wheelchair. She lives at a skilled nursing facility. [facility]  She gets hemodialysis Tuesday Thursday Saturday. [facility].  Cardiac clearance done     Other Problems (Sonya Bynum, CMA; 12/09/2014 10:39 AM) Arthritis Cerebrovascular Accident Chronic Renal Failure Syndrome Diabetes Mellitus Other disease, cancer, significant illness  Past Surgical History (Sruthi Maurer C Islam Villescas, MD; 12/09/2014 11:42 AM) Cataract Surgery Bilateral. Dialysis Shunt / Fistula TUBAL LIGATION, LAPAROSCOPIC (58670) ~70 y/o  Diagnostic Studies History (Sonya Bynum, CMA; 12/09/2014 10:39 AM) Colonoscopy never Mammogram >3 years ago Pap Smear >5 years ago  Allergies (Sonya Bynum, CMA;  12/09/2014 10:41 AM) No Known Drug Allergies10/18/2016  Medication History (Sonya Bynum, CMA; 12/09/2014 10:44 AM) BD Pen Needle Mini U/F (31G X 5 MM Misc,) Active. Besivance (0.6% Suspension, Ophthalmic) Active. Durezol (0.05% Emulsion, Ophthalmic) Active. Lantus SoloStar (100UNIT/ML Soln Pen-inj, Subcutaneous) Active. NovoLOG FlexPen (100UNIT/ML Soln Pen-inj, Subcutaneous) Active. Pantoprazole Sodium (40MG Tablet DR, Oral) Active. Pravastatin Sodium (20MG Tablet, Oral) Active. Renagel (800MG Tablet, Oral) Active. Ilevro (0.3% Suspension, Ophthalmic) Active. Carvedilol (6.25MG Tablet, Oral) Active. Oxycodone-Acetaminophen (5-325MG Tablet, Oral as needed) Active. Aspirin (81MG Tablet Chewable, Oral) Active. Medications Reconciled  Social History (Sonya Bynum, CMA; 12/09/2014 10:39 AM) Alcohol use Remotely quit alcohol use. No caffeine use Tobacco use Never smoker.  Family History (Sonya Bynum, CMA; 12/09/2014 10:39 AM) Alcohol Abuse Son. Arthritis Sister. Breast Cancer Family Members In General. Cerebrovascular Accident Family Members In General. Diabetes Mellitus Family Members In General. Hypertension Mother. Kidney Disease Family Members In General. Malignant Neoplasm Of Pancreas Family Members In General. Prostate Cancer Brother. Rectal Cancer Family Members In General.  Pregnancy / Birth History (Sonya Bynum, CMA; 12/09/2014 10:39 AM) Age at menarche 13 years. Age of menopause 46-50 Gravida 2 Maternal age 26-30 Para 2    Review of Systems (Sonya Bynum CMA; 12/09/2014 10:39 AM) General Present- Weight Loss. Not Present- Appetite Loss, Chills, Fatigue, Fever, Night Sweats and Weight Gain. Respiratory Present- Chronic Cough. Not Present- Bloody sputum, Difficulty Breathing, Snoring and Wheezing. Breast Not Present- Breast Mass, Breast Pain, Nipple Discharge and Skin Changes. Cardiovascular Not Present- Chest Pain, Difficulty Breathing  Lying Down, Leg Cramps, Palpitations, Rapid Heart Rate, Shortness of Breath and Swelling of Extremities. Gastrointestinal Present- Chronic diarrhea, Gets full quickly at meals and Nausea. Not Present- Abdominal Pain, Bloating, Bloody Stool, Change in Bowel Habits, Constipation, Difficulty Swallowing, Excessive gas, Hemorrhoids, Indigestion, Rectal Pain and Vomiting. Female Genitourinary Not Present- Frequency, Nocturia, Painful Urination, Pelvic   Pain and Urgency. Musculoskeletal Present- Joint Pain and Joint Stiffness. Not Present- Back Pain, Muscle Pain, Muscle Weakness and Swelling of Extremities. Neurological Present- Trouble walking. Not Present- Decreased Memory, Fainting, Headaches, Numbness, Seizures, Tingling, Tremor and Weakness. Psychiatric Not Present- Anxiety, Bipolar, Change in Sleep Pattern, Depression, Fearful and Frequent crying. Hematology Not Present- Easy Bruising, Excessive bleeding, Gland problems, HIV and Persistent Infections.  Vitals (Sonya Bynum CMA; 12/09/2014 10:40 AM) 12/09/2014 10:39 AM Weight: 87 lb Height: 63in Body Surface Area: 1.36 m Body Mass Index: 15.41 kg/m  Temp.: 98F(Temporal)  Pulse: 75 (Regular)  BP: 110/70 (Sitting, Left Arm, Standard)       Physical Exam (Tessie Ordaz C. Chantz Montefusco MD; 12/09/2014 11:41 AM) General Mental Status-Alert. General Appearance-Not in acute distress, Not Sickly. Orientation-Oriented X3. Hydration-Well hydrated. Voice-Normal. Note: Alert. Cachectic.   Integumentary Global Assessment Upon inspection and palpation of skin surfaces of the - Axillae: non-tender, no inflammation or ulceration, no drainage. and Distribution of scalp and body hair is normal. General Characteristics Temperature - normal warmth is noted.  Head and Neck Head-normocephalic, atraumatic with no lesions or palpable masses. Face Global Assessment - atraumatic, no absence of expression. Neck Global Assessment - no abnormal  movements, no bruit auscultated on the right, no bruit auscultated on the left, no decreased range of motion, non-tender. Trachea-midline. Thyroid Gland Characteristics - non-tender.  Eye Eyeball - Left-Extraocular movements intact, No Nystagmus. Eyeball - Right-Extraocular movements intact, No Nystagmus. Cornea - Left-No Hazy. Cornea - Right-No Hazy. Sclera/Conjunctiva - Left-No scleral icterus, No Discharge. Sclera/Conjunctiva - Right-No scleral icterus, No Discharge. Pupil - Left-Direct reaction to light normal. Pupil - Right-Direct reaction to light normal.  ENMT Ears Pinna - Left - no drainage observed, no generalized tenderness observed. Right - no drainage observed, no generalized tenderness observed. Nose and Sinuses External Inspection of the Nose - no destructive lesion observed. Inspection of the nares - Left - quiet respiration. Right - quiet respiration. Mouth and Throat Lips - Upper Lip - no fissures observed, no pallor noted. Lower Lip - no fissures observed, no pallor noted. Nasopharynx - no discharge present. Oral Cavity/Oropharynx - Tongue - no dryness observed. Oral Mucosa - no cyanosis observed. Hypopharynx - no evidence of airway distress observed.  Chest and Lung Exam Inspection Movements - Normal and Symmetrical. Accessory muscles - No use of accessory muscles in breathing. Palpation Palpation of the chest reveals - Non-tender. Auscultation Breath sounds - Normal and Clear.  Cardiovascular Auscultation Rhythm - Regular. Murmurs & Other Heart Sounds - Auscultation of the heart reveals - No Murmurs and No Systolic Clicks.  Abdomen Inspection Inspection of the abdomen reveals - No Visible peristalsis and No Abnormal pulsations. Umbilicus - No Bleeding, No Urine drainage. Palpation/Percussion Palpation and Percussion of the abdomen reveal - Soft, Non Tender, No Rebound tenderness, No Rigidity (guarding) and No Cutaneous  hyperesthesia. Note: Thin and flat. No diastases. No incisions except small area at bellybutton consistent with prior tubal ligation. No umbilical hernia.   Female Genitourinary Sexual Maturity Tanner 5 - Adult hair pattern. Note: No vaginal bleeding nor discharge   Peripheral Vascular Upper Extremity Inspection - Left - No Cyanotic nailbeds, Not Ischemic. Right - No Cyanotic nailbeds, Not Ischemic.  Neurologic Neurologic evaluation reveals -normal attention span and ability to concentrate, able to name objects and repeat phrases. Appropriate fund of knowledge , normal sensation and normal coordination. Mental Status Affect - not angry, not paranoid. Cranial Nerves-Normal Bilaterally. Gait-Normal.  Neuropsychiatric Mental status exam performed with findings of-able to   articulate well with normal speech/language, rate, volume and coherence, thought content normal with ability to perform basic computations and apply abstract reasoning and no evidence of hallucinations, delusions, obsessions or homicidal/suicidal ideation.  Musculoskeletal Global Assessment Spine, Ribs and Pelvis - no instability, subluxation or laxity. Right Upper Extremity - no instability, subluxation or laxity. Note: Status post LEFT below the knee amputation. Sitting in wheelchair. Calm. Significant atrophy/cachexia of RIGHT lower extremity but can bear weight.   Lymphatic Head & Neck  General Head & Neck Lymphatics: Bilateral - Description - No Localized lymphadenopathy. Axillary  General Axillary Region: Bilateral - Description - No Localized lymphadenopathy. Femoral & Inguinal  Generalized Femoral & Inguinal Lymphatics: Left - Description - No Localized lymphadenopathy. Right - Description - No Localized lymphadenopathy.    Assessment & Plan  ACHALASIA (K22.0) Impression: Classic history and physical findings for achalasia. Confirmed by barium swallow, upper endoscopy, and  manometry.  She is losing weight and becoming cachectic. She would benefit from surgery to palliate her achalasia. Botox a short-term solution. She is not interested in pneumatic dilatation. Certainly from an anatomic standpoint she's not primitive. She's not had prior dilations nor Botox injections which should make the myotomy not too technically difficult.  However her numerous comorbidities including her renal failure with hemodialysis dependence and insulin-requiring diabetes increase her operative risks. She wishes to be aggressive and proceed with surgery since she is tolerated general anesthesia was numerous times this year.  I will plan to do this robotically at [place] with admission to [facility] postoperatively for management of dialysis and other issues.  Current Plans You are being scheduled for surgery - Our schedulers will call you.  You should hear from our office's scheduling department within 5 working days about the location, date, and time of surgery. We try to make accommodations for patient's preferences in scheduling surgery, but sometimes the OR schedule or the surgeon's schedule prevents us from making those accommodations.  If you have not heard from our office ([phone]) in 5 working days, call the office and ask for your surgeon's nurse.  If you have other questions about your diagnosis, plan, or surgery, call the office and ask for your surgeon's nurse.  The anatomy & physiology of the foregut and anti-reflux mechanism was discussed. The pathophysiology of achalasia was discussed. Natural history risks without surgery was discussed. The patient's symptoms are not adequately controlled by non-operative treatments. I feel the risks of no intervention will lead to serious problems that outweigh the operative risks; therefore, I recommended surgery to relax the fibrotic LES & rebuild the anti-reflux valve to control reflux. Need for a thorough workup to  rule out the differential diagnosis and plan treatment was explained. I explained laparoscopic techniques with possible need for an open approach.  Risks such as bleeding, infection, abscess, leak, need for further treatment, heart attack, death, and other risks were discussed. I noted a good likelihood this will help address the problem. Goals of post-operative recovery were discussed as well. Possibility that this will not correct all symptoms was explained. Post-operative dysphagia, need for short-term liquid & pureed diet, inability to vomit, possibility of recurrence needing further treatment, possible need for medicines to help control symptoms in addition to surgery were discussed. We will work to minimize complications. Educational handouts further explaining the pathology, treatment options, and dysphagia diet was given as well. Questions were answered. The patient expresses understanding & wishes to proceed with surgery.  Pt Education - [facility] Achalasia HCI (  Breshae Belcher) Pt Education - [facility] Laparosopic Post Op HCI (Via Rosado) Pt Education - [facility] Esophageal Surgery Diet HCI (Izamar Linden): discussed with patient and provided information. I recommended obtaining preoperative cardiac clearance. I am concerned about the health of the patient and the ability to tolerate the operation. Therefore, we will request clearance by cardiology to better assess operative risk & see if a reevaluation, further workup, etc is needed. Also recommendations on how medications such as for anticoagulation and blood pressure should be managed/held/restarted after surgery.  Harue Pribble C. Enez Monahan, M.D., F.A.C.S. Gastrointestinal and Minimally Invasive Surgery [facility] [address], [address] [place], [place] [address] [phone] Main / Paging   

## 2015-03-02 NOTE — Progress Notes (Signed)
Spoke with Gwynneth Macleod LPN at [facility] all faxed pre op instructions for 03-06-15 surgery received by fax and understood

## 2015-03-03 ENCOUNTER — Other Ambulatory Visit: Payer: Self-pay | Admitting: Surgery

## 2015-03-05 NOTE — Anesthesia Preprocedure Evaluation (Signed)
Anesthesia Evaluation  Patient identified by MRN, date of birth, ID band Patient awake    Reviewed: Allergy & Precautions, H&P , NPO status , Patient's Chart, lab work & pertinent test results  Airway Mallampati: II  TM Distance: >3 FB Neck ROM: Full    Dental no notable dental hx. (+) Partial Upper, Edentulous Lower, Dental Advisory Given   Pulmonary neg pulmonary ROS,    Pulmonary exam normal breath sounds clear to auscultation       Cardiovascular hypertension, Pt. on medications + CAD and + Peripheral Vascular Disease   Rhythm:Regular Rate:Normal  EF 30-35%. Moderate CAD on cath from 03/2014. No stents or angioplasty performed.   Neuro/Psych CVA negative psych ROS   GI/Hepatic negative GI ROS, Neg liver ROS,   Endo/Other  diabetes, Type 1, Insulin Dependent  Renal/GU ESRF and DialysisRenal disease  negative genitourinary   Musculoskeletal   Abdominal   Peds  Hematology negative hematology ROS (+)   Anesthesia Other Findings   Reproductive/Obstetrics negative OB ROS                             Lab Results  Component Value Date   WBC 5.6 08/13/2014   HGB 16.3* 10/29/2014   HCT 48.0* 10/29/2014   MCV 83.0 08/13/2014   PLT 156 08/13/2014   Lab Results  Component Value Date   CREATININE 5.49* 08/13/2014   BUN 86* 08/13/2014   NA 130* 10/29/2014   K 4.6 10/29/2014   CL 99* 08/13/2014   CO2 19* 08/13/2014    Anesthesia Physical  Anesthesia Plan  ASA: III  Anesthesia Plan: General   Post-op Pain Management:    Induction: Intravenous  Airway Management Planned: Oral ETT  Additional Equipment: Arterial line  Intra-op Plan:   Post-operative Plan: Extubation in OR and Possible Post-op intubation/ventilation  Informed Consent: I have reviewed the patients History and Physical, chart, labs and discussed the procedure including the risks, benefits and alternatives for the  proposed anesthesia with the patient or authorized representative who has indicated his/her understanding and acceptance.   Dental advisory given  Plan Discussed with: CRNA  Anesthesia Plan Comments:         Anesthesia Quick Evaluation

## 2015-03-06 ENCOUNTER — Ambulatory Visit (HOSPITAL_COMMUNITY): Payer: Medicare Other | Admitting: Anesthesiology

## 2015-03-06 ENCOUNTER — Encounter (HOSPITAL_COMMUNITY): Payer: Self-pay | Admitting: *Deleted

## 2015-03-06 ENCOUNTER — Inpatient Hospital Stay (HOSPITAL_COMMUNITY)
Admission: RE | Admit: 2015-03-06 | Discharge: 2015-03-13 | DRG: 326 | Disposition: A | Discharge status: Skilled Nursing Facility | Payer: Medicare Other | Source: Ambulatory Visit | Attending: Surgery | Admitting: Surgery

## 2015-03-06 ENCOUNTER — Encounter (HOSPITAL_COMMUNITY)
Admission: RE | Disposition: A | Discharge status: Skilled Nursing Facility | Payer: Self-pay | Source: Ambulatory Visit | Attending: Surgery

## 2015-03-06 DIAGNOSIS — Z992 Dependence on renal dialysis: Secondary | ICD-10-CM | POA: Diagnosis not present

## 2015-03-06 DIAGNOSIS — Z8673 Personal history of transient ischemic attack (TIA), and cerebral infarction without residual deficits: Secondary | ICD-10-CM

## 2015-03-06 DIAGNOSIS — I5022 Chronic systolic (congestive) heart failure: Secondary | ICD-10-CM | POA: Diagnosis present

## 2015-03-06 DIAGNOSIS — E11649 Type 2 diabetes mellitus with hypoglycemia without coma: Secondary | ICD-10-CM | POA: Diagnosis present

## 2015-03-06 DIAGNOSIS — D638 Anemia in other chronic diseases classified elsewhere: Secondary | ICD-10-CM | POA: Diagnosis present

## 2015-03-06 DIAGNOSIS — J690 Pneumonitis due to inhalation of food and vomit: Secondary | ICD-10-CM

## 2015-03-06 DIAGNOSIS — E1151 Type 2 diabetes mellitus with diabetic peripheral angiopathy without gangrene: Secondary | ICD-10-CM | POA: Diagnosis present

## 2015-03-06 DIAGNOSIS — N186 End stage renal disease: Secondary | ICD-10-CM | POA: Diagnosis present

## 2015-03-06 DIAGNOSIS — E43 Unspecified severe protein-calorie malnutrition: Secondary | ICD-10-CM | POA: Diagnosis present

## 2015-03-06 DIAGNOSIS — R1310 Dysphagia, unspecified: Secondary | ICD-10-CM | POA: Diagnosis present

## 2015-03-06 DIAGNOSIS — E1122 Type 2 diabetes mellitus with diabetic chronic kidney disease: Secondary | ICD-10-CM | POA: Diagnosis present

## 2015-03-06 DIAGNOSIS — B3749 Other urogenital candidiasis: Secondary | ICD-10-CM | POA: Diagnosis present

## 2015-03-06 DIAGNOSIS — L89154 Pressure ulcer of sacral region, stage 4: Secondary | ICD-10-CM | POA: Diagnosis present

## 2015-03-06 DIAGNOSIS — Z681 Body mass index (BMI) 19 or less, adult: Secondary | ICD-10-CM

## 2015-03-06 DIAGNOSIS — I255 Ischemic cardiomyopathy: Secondary | ICD-10-CM | POA: Diagnosis present

## 2015-03-06 DIAGNOSIS — G629 Polyneuropathy, unspecified: Secondary | ICD-10-CM

## 2015-03-06 DIAGNOSIS — R64 Cachexia: Secondary | ICD-10-CM | POA: Diagnosis present

## 2015-03-06 DIAGNOSIS — I2510 Atherosclerotic heart disease of native coronary artery without angina pectoris: Secondary | ICD-10-CM | POA: Diagnosis present

## 2015-03-06 DIAGNOSIS — K220 Achalasia of cardia: Principal | ICD-10-CM | POA: Diagnosis present

## 2015-03-06 DIAGNOSIS — I739 Peripheral vascular disease, unspecified: Secondary | ICD-10-CM | POA: Diagnosis present

## 2015-03-06 DIAGNOSIS — L98429 Non-pressure chronic ulcer of back with unspecified severity: Secondary | ICD-10-CM | POA: Diagnosis present

## 2015-03-06 DIAGNOSIS — E785 Hyperlipidemia, unspecified: Secondary | ICD-10-CM | POA: Diagnosis present

## 2015-03-06 DIAGNOSIS — Z794 Long term (current) use of insulin: Secondary | ICD-10-CM

## 2015-03-06 DIAGNOSIS — E1129 Type 2 diabetes mellitus with other diabetic kidney complication: Secondary | ICD-10-CM | POA: Diagnosis present

## 2015-03-06 DIAGNOSIS — E1140 Type 2 diabetes mellitus with diabetic neuropathy, unspecified: Secondary | ICD-10-CM | POA: Diagnosis present

## 2015-03-06 DIAGNOSIS — Z89519 Acquired absence of unspecified leg below knee: Secondary | ICD-10-CM

## 2015-03-06 DIAGNOSIS — L8990 Pressure ulcer of unspecified site, unspecified stage: Secondary | ICD-10-CM | POA: Insufficient documentation

## 2015-03-06 DIAGNOSIS — Z89512 Acquired absence of left leg below knee: Secondary | ICD-10-CM

## 2015-03-06 DIAGNOSIS — R509 Fever, unspecified: Secondary | ICD-10-CM

## 2015-03-06 HISTORY — DX: Unspecified coma: R40.20

## 2015-03-06 HISTORY — DX: Local infection of the skin and subcutaneous tissue, unspecified: L08.9

## 2015-03-06 HISTORY — DX: Urinary tract infection, site not specified: N39.0

## 2015-03-06 HISTORY — PX: UPPER GI ENDOSCOPY: SHX6162

## 2015-03-06 HISTORY — DX: Hyperkalemia: E87.5

## 2015-03-06 LAB — CBC
HEMATOCRIT: 35.7 % — AB (ref 36.0–46.0)
HEMOGLOBIN: 11.2 g/dL — AB (ref 12.0–15.0)
MCH: 26.7 pg (ref 26.0–34.0)
MCHC: 31.4 g/dL (ref 30.0–36.0)
MCV: 85 fL (ref 78.0–100.0)
Platelets: 190 10*3/uL (ref 150–400)
RBC: 4.2 MIL/uL (ref 3.87–5.11)
RDW: 19.1 % — ABNORMAL HIGH (ref 11.5–15.5)
WBC: 5.2 10*3/uL (ref 4.0–10.5)

## 2015-03-06 LAB — GLUCOSE, CAPILLARY
GLUCOSE-CAPILLARY: 103 mg/dL — AB (ref 65–99)
GLUCOSE-CAPILLARY: 212 mg/dL — AB (ref 65–99)
GLUCOSE-CAPILLARY: 178 mg/dL — AB (ref 65–99)
Glucose-Capillary: 239 mg/dL — ABNORMAL HIGH (ref 65–99)
Glucose-Capillary: 127 mg/dL — ABNORMAL HIGH (ref 65–99)

## 2015-03-06 LAB — BASIC METABOLIC PANEL
ANION GAP: 13 (ref 5–15)
BUN: 37 mg/dL — ABNORMAL HIGH (ref 6–20)
CHLORIDE: 100 mmol/L — AB (ref 101–111)
CO2: 26 mmol/L (ref 22–32)
CREATININE: 3.31 mg/dL — AB (ref 0.44–1.00)
Calcium: 8.2 mg/dL — ABNORMAL LOW (ref 8.9–10.3)
GFR calc non Af Amer: 13 mL/min — ABNORMAL LOW (ref >60)
GFR, EST AFRICAN AMERICAN: 15 mL/min — AB (ref >60)
Glucose, Bld: 107 mg/dL — ABNORMAL HIGH (ref 65–99)
POTASSIUM: 4.6 mmol/L (ref 3.5–5.1)
SODIUM: 139 mmol/L (ref 135–145)

## 2015-03-06 SURGERY — FUNDOPLICATION, NISSEN, ROBOT-ASSISTED, LAPAROSCOPIC
Anesthesia: General | Site: Esophagus

## 2015-03-06 MED ORDER — BUPIVACAINE-EPINEPHRINE (PF) 0.25% -1:200000 IJ SOLN
INTRAMUSCULAR | Status: AC
  Filled 2015-03-06: qty 30

## 2015-03-06 MED ORDER — METOCLOPRAMIDE HCL 5 MG/ML IJ SOLN: 5.0000 mg | Freq: Four times a day (QID) | INTRAMUSCULAR | Status: DC | PRN

## 2015-03-06 MED ORDER — METRONIDAZOLE IN NACL 5-0.79 MG/ML-% IV SOLN
INTRAVENOUS | Status: AC
Start: 2015-03-06 — End: 2015-03-06
  Filled 2015-03-06: qty 100

## 2015-03-06 MED ORDER — ADULT MULTIVITAMIN W/MINERALS CH
1.0000 | ORAL_TABLET | Freq: Every day | ORAL | Status: DC
  Administered 2015-03-08 – 2015-03-11 (×4): 1 via ORAL
  Filled 2015-03-06 (×6): qty 1

## 2015-03-06 MED ORDER — PROPOFOL 10 MG/ML IV BOLUS
INTRAVENOUS | Status: AC
  Filled 2015-03-06: qty 20

## 2015-03-06 MED ORDER — CISATRACURIUM BESYLATE 20 MG/10ML IV SOLN
INTRAVENOUS | Status: AC
  Filled 2015-03-06: qty 10

## 2015-03-06 MED ORDER — DIPHENHYDRAMINE HCL 50 MG/ML IJ SOLN: 12.5000 mg | Freq: Four times a day (QID) | INTRAMUSCULAR | Status: DC | PRN

## 2015-03-06 MED ORDER — SODIUM CHLORIDE 0.9 % IJ SOLN
3.0000 mL | Freq: Two times a day (BID) | INTRAMUSCULAR | Status: DC
  Administered 2015-03-06 – 2015-03-13 (×8): 3 mL via INTRAVENOUS

## 2015-03-06 MED ORDER — SODIUM CHLORIDE 0.9 % IJ SOLN
3.0000 mL | INTRAMUSCULAR | Status: DC | PRN
Start: 2015-03-06 — End: 2015-03-13

## 2015-03-06 MED ORDER — METRONIDAZOLE IN NACL 5-0.79 MG/ML-% IV SOLN
500.0000 mg | INTRAVENOUS | Status: AC
  Administered 2015-03-06: 500 mg via INTRAVENOUS

## 2015-03-06 MED ORDER — SODIUM CHLORIDE 0.9 % IJ SOLN
INTRAMUSCULAR | Status: AC
Start: 2015-03-06 — End: 2015-03-06
  Filled 2015-03-06: qty 50

## 2015-03-06 MED ORDER — SODIUM CHLORIDE 0.9 % IJ SOLN
INTRAMUSCULAR | Status: AC
  Filled 2015-03-06: qty 10

## 2015-03-06 MED ORDER — DEXAMETHASONE SODIUM PHOSPHATE 10 MG/ML IJ SOLN
INTRAMUSCULAR | Status: DC | PRN
  Administered 2015-03-06: 10 mg via INTRAVENOUS

## 2015-03-06 MED ORDER — ASPIRIN EC 81 MG PO TBEC
81.0000 mg | DELAYED_RELEASE_TABLET | Freq: Every day | ORAL | Status: DC
  Administered 2015-03-06 – 2015-03-11 (×5): 81 mg via ORAL
  Filled 2015-03-06 (×6): qty 1

## 2015-03-06 MED ORDER — OXYCODONE HCL 5 MG PO TABS: 5.0000 mg | ORAL_TABLET | ORAL | Status: DC | PRN

## 2015-03-06 MED ORDER — FENTANYL CITRATE (PF) 100 MCG/2ML IJ SOLN
INTRAMUSCULAR | Status: AC
  Filled 2015-03-06: qty 2

## 2015-03-06 MED ORDER — ETOMIDATE 2 MG/ML IV SOLN
INTRAVENOUS | Status: DC | PRN
  Administered 2015-03-06: 8 mg via INTRAVENOUS

## 2015-03-06 MED ORDER — LIDOCAINE HCL (CARDIAC) 20 MG/ML IV SOLN
INTRAVENOUS | Status: AC
  Filled 2015-03-06: qty 5

## 2015-03-06 MED ORDER — LIP MEDEX EX OINT
1.0000 | TOPICAL_OINTMENT | Freq: Two times a day (BID) | CUTANEOUS | Status: DC
Start: 2015-03-06 — End: 2015-03-13
  Administered 2015-03-08 – 2015-03-11 (×7): 1 via TOPICAL
  Filled 2015-03-06: qty 7

## 2015-03-06 MED ORDER — ONDANSETRON HCL 4 MG/2ML IJ SOLN
INTRAMUSCULAR | Status: AC
  Filled 2015-03-06: qty 2

## 2015-03-06 MED ORDER — HEPARIN SODIUM (PORCINE) 5000 UNIT/ML IJ SOLN
5000.0000 [IU] | Freq: Three times a day (TID) | INTRAMUSCULAR | Status: DC
  Administered 2015-03-07 – 2015-03-12 (×14): 5000 [IU] via SUBCUTANEOUS
  Filled 2015-03-06 (×11): qty 1

## 2015-03-06 MED ORDER — LIDOCAINE HCL (CARDIAC) 20 MG/ML IV SOLN
INTRAVENOUS | Status: DC | PRN
  Administered 2015-03-06: 60 mg via INTRAVENOUS

## 2015-03-06 MED ORDER — PHENOL 1.4 % MT LIQD: 2.0000 | OROMUCOSAL | Status: DC | PRN

## 2015-03-06 MED ORDER — EPHEDRINE SULFATE 50 MG/ML IJ SOLN
INTRAMUSCULAR | Status: AC
  Filled 2015-03-06: qty 1

## 2015-03-06 MED ORDER — SODIUM CHLORIDE 0.9 % IV SOLN
INTRAVENOUS | Status: DC | PRN
  Administered 2015-03-06: 07:00:00 via INTRAVENOUS

## 2015-03-06 MED ORDER — 0.9 % SODIUM CHLORIDE (POUR BTL) OPTIME
TOPICAL | Status: DC | PRN
  Administered 2015-03-06: 1000 mL

## 2015-03-06 MED ORDER — SODIUM CHLORIDE 0.9 % IV SOLN
10.0000 mg | INTRAVENOUS | Status: DC | PRN
  Administered 2015-03-06: 25 ug/min via INTRAVENOUS

## 2015-03-06 MED ORDER — INSULIN ASPART 100 UNIT/ML ~~LOC~~ SOLN: 0.0000 [IU] | Freq: Every day | SUBCUTANEOUS | Status: DC

## 2015-03-06 MED ORDER — EPHEDRINE SULFATE 50 MG/ML IJ SOLN
INTRAMUSCULAR | Status: DC | PRN
  Administered 2015-03-06 (×2): 10 mg via INTRAVENOUS

## 2015-03-06 MED ORDER — BUPIVACAINE-EPINEPHRINE 0.25% -1:200000 IJ SOLN
INTRAMUSCULAR | Status: DC | PRN
  Administered 2015-03-06: 15 mL

## 2015-03-06 MED ORDER — SENNA 8.6 MG PO TABS
1.0000 | ORAL_TABLET | Freq: Two times a day (BID) | ORAL | Status: DC
  Administered 2015-03-06 – 2015-03-09 (×4): 8.6 mg via ORAL
  Filled 2015-03-06 (×5): qty 1

## 2015-03-06 MED ORDER — PHENYLEPHRINE HCL 10 MG/ML IJ SOLN
INTRAMUSCULAR | Status: AC
  Filled 2015-03-06: qty 1

## 2015-03-06 MED ORDER — LORATADINE 10 MG PO TABS
10.0000 mg | ORAL_TABLET | Freq: Every day | ORAL | Status: DC
  Administered 2015-03-08 – 2015-03-11 (×4): 10 mg via ORAL
  Filled 2015-03-06 (×7): qty 1

## 2015-03-06 MED ORDER — FENTANYL CITRATE (PF) 100 MCG/2ML IJ SOLN
25.0000 ug | INTRAMUSCULAR | Status: DC | PRN
  Administered 2015-03-06 (×2): 25 ug via INTRAVENOUS

## 2015-03-06 MED ORDER — PROMETHAZINE HCL 25 MG/ML IJ SOLN
6.2500 mg | INTRAMUSCULAR | Status: DC | PRN
  Administered 2015-03-06: 6.25 mg via INTRAVENOUS

## 2015-03-06 MED ORDER — CISATRACURIUM BESYLATE (PF) 10 MG/5ML IV SOLN
INTRAVENOUS | Status: DC | PRN
  Administered 2015-03-06: 4 mg via INTRAVENOUS
  Administered 2015-03-06: 2 mg via INTRAVENOUS
  Administered 2015-03-06: 8 mg via INTRAVENOUS
  Administered 2015-03-06: 2 mg via INTRAVENOUS

## 2015-03-06 MED ORDER — BUPIVACAINE LIPOSOME 1.3 % IJ SUSP
20.0000 mL | Freq: Once | INTRAMUSCULAR | Status: DC
  Filled 2015-03-06: qty 20

## 2015-03-06 MED ORDER — DEXAMETHASONE SODIUM PHOSPHATE 10 MG/ML IJ SOLN
INTRAMUSCULAR | Status: AC
  Filled 2015-03-06: qty 1

## 2015-03-06 MED ORDER — LIDOCAINE-PRILOCAINE 2.5-2.5 % EX CREA: TOPICAL_CREAM | CUTANEOUS | Status: DC

## 2015-03-06 MED ORDER — FENTANYL CITRATE (PF) 250 MCG/5ML IJ SOLN
INTRAMUSCULAR | Status: AC
  Filled 2015-03-06: qty 5

## 2015-03-06 MED ORDER — FENTANYL CITRATE (PF) 100 MCG/2ML IJ SOLN
INTRAMUSCULAR | Status: DC | PRN
  Administered 2015-03-06 (×2): 25 ug via INTRAVENOUS
  Administered 2015-03-06: 50 ug via INTRAVENOUS
  Administered 2015-03-06: 25 ug via INTRAVENOUS

## 2015-03-06 MED ORDER — GLYCOPYRROLATE 0.2 MG/ML IJ SOLN
INTRAMUSCULAR | Status: AC
  Filled 2015-03-06: qty 2

## 2015-03-06 MED ORDER — SUCCINYLCHOLINE CHLORIDE 20 MG/ML IJ SOLN
INTRAMUSCULAR | Status: DC | PRN
  Administered 2015-03-06: 70 mg via INTRAVENOUS

## 2015-03-06 MED ORDER — LACTATED RINGERS IV BOLUS (SEPSIS): 1000.0000 mL | Freq: Three times a day (TID) | INTRAVENOUS | Status: AC | PRN

## 2015-03-06 MED ORDER — GLYCOPYRROLATE 0.2 MG/ML IJ SOLN
INTRAMUSCULAR | Status: DC | PRN
  Administered 2015-03-06: 0.4 mg via INTRAVENOUS

## 2015-03-06 MED ORDER — CHLORHEXIDINE GLUCONATE 4 % EX LIQD: 1.0000 "application " | Freq: Once | CUTANEOUS | Status: DC

## 2015-03-06 MED ORDER — MAGIC MOUTHWASH: 15.0000 mL | Freq: Four times a day (QID) | ORAL | Status: DC | PRN

## 2015-03-06 MED ORDER — PRAVASTATIN SODIUM 10 MG PO TABS
20.0000 mg | ORAL_TABLET | Freq: Every evening | ORAL | Status: DC
  Administered 2015-03-06 – 2015-03-11 (×5): 20 mg via ORAL
  Filled 2015-03-06 (×7): qty 2

## 2015-03-06 MED ORDER — HYDROMORPHONE HCL 1 MG/ML IJ SOLN: 0.5000 mg | INTRAMUSCULAR | Status: DC | PRN

## 2015-03-06 MED ORDER — MENTHOL 3 MG MT LOZG: 1.0000 | LOZENGE | OROMUCOSAL | Status: DC | PRN

## 2015-03-06 MED ORDER — LIP MEDEX EX OINT
TOPICAL_OINTMENT | CUTANEOUS | Status: AC
  Filled 2015-03-06: qty 7

## 2015-03-06 MED ORDER — DECUBI-VITE PO CAPS: 2.0000 | ORAL_CAPSULE | Freq: Every day | ORAL | Status: DC

## 2015-03-06 MED ORDER — ONDANSETRON HCL 4 MG/2ML IJ SOLN
INTRAMUSCULAR | Status: DC | PRN
  Administered 2015-03-06: 4 mg via INTRAVENOUS

## 2015-03-06 MED ORDER — LACTATED RINGERS IR SOLN
Status: DC | PRN
  Administered 2015-03-06: 1000 mL

## 2015-03-06 MED ORDER — SODIUM CHLORIDE 0.9 % IV SOLN: 10.0000 mg | INTRAVENOUS | Status: DC | PRN

## 2015-03-06 MED ORDER — PROMETHAZINE HCL 25 MG PO TABS
25.0000 mg | ORAL_TABLET | Freq: Four times a day (QID) | ORAL | Status: DC | PRN
  Filled 2015-03-06: qty 1

## 2015-03-06 MED ORDER — DEXTROSE 5 % IV SOLN
INTRAVENOUS | Status: AC
  Filled 2015-03-06: qty 2

## 2015-03-06 MED ORDER — SODIUM CHLORIDE 0.9 % IV SOLN: 250.0000 mL | INTRAVENOUS | Status: DC | PRN

## 2015-03-06 MED ORDER — INSULIN GLARGINE 100 UNIT/ML ~~LOC~~ SOLN
3.0000 [IU] | Freq: Two times a day (BID) | SUBCUTANEOUS | Status: DC
  Administered 2015-03-07 – 2015-03-11 (×3): 3 [IU] via SUBCUTANEOUS
  Filled 2015-03-06 (×13): qty 0.03

## 2015-03-06 MED ORDER — BISACODYL 10 MG RE SUPP: 10.0000 mg | Freq: Two times a day (BID) | RECTAL | Status: DC | PRN

## 2015-03-06 MED ORDER — DEXTROSE 5 % IV SOLN
2.0000 g | INTRAVENOUS | Status: AC
  Administered 2015-03-06: 2 g via INTRAVENOUS

## 2015-03-06 MED ORDER — HYDROCODONE-ACETAMINOPHEN 7.5-325 MG PO TABS: 1.0000 | ORAL_TABLET | Freq: Once | ORAL | Status: DC | PRN

## 2015-03-06 MED ORDER — ETOMIDATE 2 MG/ML IV SOLN
INTRAVENOUS | Status: AC
  Filled 2015-03-06: qty 10

## 2015-03-06 MED ORDER — METOPROLOL TARTRATE 1 MG/ML IV SOLN: 5.0000 mg | Freq: Four times a day (QID) | INTRAVENOUS | Status: DC | PRN

## 2015-03-06 MED ORDER — PANTOPRAZOLE SODIUM 40 MG PO TBEC
40.0000 mg | DELAYED_RELEASE_TABLET | Freq: Every day | ORAL | Status: DC
  Administered 2015-03-06 – 2015-03-11 (×5): 40 mg via ORAL
  Filled 2015-03-06 (×7): qty 1

## 2015-03-06 MED ORDER — GLYCOPYRROLATE 0.2 MG/ML IJ SOLN
INTRAMUSCULAR | Status: AC
Start: 2015-03-06 — End: 2015-03-06
  Filled 2015-03-06: qty 1

## 2015-03-06 MED ORDER — INSULIN ASPART 100 UNIT/ML ~~LOC~~ SOLN
0.0000 [IU] | Freq: Three times a day (TID) | SUBCUTANEOUS | Status: DC
  Administered 2015-03-06: 2 [IU] via SUBCUTANEOUS
  Administered 2015-03-07: 1 [IU] via SUBCUTANEOUS
  Administered 2015-03-08: 2 [IU] via SUBCUTANEOUS

## 2015-03-06 MED ORDER — LIDOCAINE-PRILOCAINE 2.5-2.5 % EX KIT: 1.0000 "application " | PACK | CUTANEOUS | Status: DC

## 2015-03-06 MED ORDER — ACETAMINOPHEN 500 MG PO TABS
1000.0000 mg | ORAL_TABLET | Freq: Three times a day (TID) | ORAL | Status: DC
  Administered 2015-03-06 – 2015-03-08 (×3): 1000 mg via ORAL
  Filled 2015-03-06 (×4): qty 2

## 2015-03-06 MED ORDER — PHENYLEPHRINE HCL 10 MG/ML IJ SOLN
INTRAMUSCULAR | Status: DC | PRN
  Administered 2015-03-06 (×2): 80 ug via INTRAVENOUS

## 2015-03-06 MED ORDER — PROMETHAZINE HCL 25 MG/ML IJ SOLN
INTRAMUSCULAR | Status: AC
  Filled 2015-03-06: qty 1

## 2015-03-06 MED ORDER — POLYETHYLENE GLYCOL 3350 17 G PO PACK: 17.0000 g | PACK | Freq: Two times a day (BID) | ORAL | Status: DC | PRN

## 2015-03-06 MED ORDER — INSULIN ASPART 100 UNIT/ML ~~LOC~~ SOLN
SUBCUTANEOUS | Status: AC
  Administered 2015-03-06: 3 [IU]
  Filled 2015-03-06: qty 1

## 2015-03-06 MED ORDER — NEOSTIGMINE METHYLSULFATE 10 MG/10ML IV SOLN
INTRAVENOUS | Status: DC | PRN
  Administered 2015-03-06: 3 mg via INTRAVENOUS

## 2015-03-06 SURGICAL SUPPLY — 54 items
APPLICATOR COTTON TIP 6IN STRL (MISCELLANEOUS) ×12 IMPLANT
APPLIER CLIP 5 13 M/L LIGAMAX5 (MISCELLANEOUS)
APPLIER CLIP ROT 10 11.4 M/L (STAPLE)
BLADE SURG SZ11 CARB STEEL (BLADE) ×4 IMPLANT
CHLORAPREP W/TINT 26ML (MISCELLANEOUS) ×8 IMPLANT
CLIP APPLIE 5 13 M/L LIGAMAX5 (MISCELLANEOUS) IMPLANT
CLIP APPLIE ROT 10 11.4 M/L (STAPLE) IMPLANT
COVER SURGICAL LIGHT HANDLE (MISCELLANEOUS) IMPLANT
COVER TIP SHEARS 8 DVNC (MISCELLANEOUS) ×2 IMPLANT
COVER TIP SHEARS 8MM DA VINCI (MISCELLANEOUS) ×2
DECANTER SPIKE VIAL GLASS SM (MISCELLANEOUS) ×4 IMPLANT
DEVICE TROCAR PUNCTURE CLOSURE (ENDOMECHANICALS) IMPLANT
DRAIN PENROSE 18X1/2 LTX STRL (DRAIN) ×4 IMPLANT
DRAPE ARM DVNC X/XI (DISPOSABLE) ×10 IMPLANT
DRAPE COLUMN DVNC XI (DISPOSABLE) ×2 IMPLANT
DRAPE DA VINCI XI ARM (DISPOSABLE) ×10
DRAPE DA VINCI XI COLUMN (DISPOSABLE) ×2
DRSG TEGADERM 2-3/8X2-3/4 SM (GAUZE/BANDAGES/DRESSINGS) ×24 IMPLANT
ELECT PENCIL ROCKER SW 15FT (MISCELLANEOUS) ×4 IMPLANT
ELECT REM PT RETURN 9FT ADLT (ELECTROSURGICAL) ×4
ELECTRODE REM PT RTRN 9FT ADLT (ELECTROSURGICAL) ×2 IMPLANT
ENDOLOOP SUT PDS II  0 18 (SUTURE)
ENDOLOOP SUT PDS II 0 18 (SUTURE) IMPLANT
GAUZE SPONGE 2X2 8PLY STRL LF (GAUZE/BANDAGES/DRESSINGS) ×2 IMPLANT
GLOVE ECLIPSE 8.0 STRL XLNG CF (GLOVE) ×8 IMPLANT
GLOVE INDICATOR 8.0 STRL GRN (GLOVE) ×8 IMPLANT
GOWN STRL REUS W/TWL XL LVL3 (GOWN DISPOSABLE) ×12 IMPLANT
KIT BASIN OR (CUSTOM PROCEDURE TRAY) ×4 IMPLANT
L-HOOK LAP DISP 36CM (ELECTROSURGICAL) ×4
LHOOK LAP DISP 36CM (ELECTROSURGICAL) ×2 IMPLANT
MARKER SKIN DUAL TIP RULER LAB (MISCELLANEOUS) ×4 IMPLANT
NEEDLE HYPO 22GX1.5 SAFETY (NEEDLE) ×4 IMPLANT
NEEDLE INSUFFLATION 14GA 120MM (NEEDLE) ×4 IMPLANT
PACK CARDIOVASCULAR III (CUSTOM PROCEDURE TRAY) ×4 IMPLANT
PAD POSITIONING PINK XL (MISCELLANEOUS) ×4 IMPLANT
SCISSORS LAP 5X35 DISP (ENDOMECHANICALS) ×4 IMPLANT
SEAL CANN UNIV 5-8 DVNC XI (MISCELLANEOUS) ×6 IMPLANT
SEAL XI 5MM-8MM UNIVERSAL (MISCELLANEOUS) ×6
SEALER VESSEL DA VINCI XI (MISCELLANEOUS) ×2
SEALER VESSEL EXT DVNC XI (MISCELLANEOUS) ×2 IMPLANT
SET BI-LUMEN FLTR TB AIRSEAL (TUBING) ×4 IMPLANT
SLEEVE XCEL OPT CAN 5 100 (ENDOMECHANICALS) IMPLANT
SOLUTION ELECTROLUBE (MISCELLANEOUS) ×4 IMPLANT
SPONGE GAUZE 2X2 STER 10/PKG (GAUZE/BANDAGES/DRESSINGS) ×2
SPONGE LAP 18X18 X RAY DECT (DISPOSABLE) ×4 IMPLANT
SUT ETHIBOND 0 36 GRN (SUTURE) ×36 IMPLANT
SUT MNCRL AB 4-0 PS2 18 (SUTURE) ×4 IMPLANT
SYR 20CC LL (SYRINGE) ×4 IMPLANT
SYRINGE 10CC LL (SYRINGE) ×4 IMPLANT
TOWEL OR 17X26 10 PK STRL BLUE (TOWEL DISPOSABLE) ×4 IMPLANT
TOWEL OR NON WOVEN STRL DISP B (DISPOSABLE) ×4 IMPLANT
TRAY FOLEY W/METER SILVER 14FR (SET/KITS/TRAYS/PACK) ×4 IMPLANT
TRAY FOLEY W/METER SILVER 16FR (SET/KITS/TRAYS/PACK) IMPLANT
TROCAR BLADELESS OPT 5 100 (ENDOMECHANICALS) ×4 IMPLANT

## 2015-03-06 NOTE — Transfer of Care (Signed)
Immediate Anesthesia Transfer of Care Note  Patient: Elizabeth [name]  Procedure(s) Performed: Procedure(s): XI ROBOTIC HELLER ESOPHAGOCARDIOMYOTOMY AND PARTIAL TOUPET FUNDOPLICATION  (N/A) UPPER GI ENDOSCOPY  Patient Location: PACU  Anesthesia Type:General  Level of Consciousness:  sedated, patient cooperative and responds to stimulation  Airway & Oxygen Therapy:Patient Spontanous Breathing and Patient connected to face mask oxgen  Post-op Assessment:  Report given to PACU RN and Post -op Vital signs reviewed and stable  Post vital signs:  Reviewed and stable  Last Vitals: There were no vitals filed for this visit.  Complications: No apparent anesthesia complications

## 2015-03-06 NOTE — Progress Notes (Deleted)
Utilization review completed.  L. J. Amyla Heffner RN, BSN, CM 

## 2015-03-06 NOTE — H&P (View-Only) (Signed)
Elizabeth [name]. Coldren 12/09/2014 10:39 AM Location: [facility] Patient #: [id number] DOB: 1945-04-02 Single / Language: [place] / Race: Black or African American Female   History of Present Illness  The patient is a 70 year old female. Patient sent for surgical consultation by Dr. Jeani Hawking for concern of worsening achalasia.  Woman with worsening issues of dysphagia. Insulin-requiring diabetes. 8 end-stage renal disease on dialysis Tuesday Thursday Saturday. Peripheral vascular disease. LEFT below the knee amputation. Had an episode of emesis. Possibly coffee ground. Found to have very dilated esophagus by endoscopy. No obstructing mass nor tumor. Barium swallow suspicious for achalasia in April 2014. Dilated esophagus but not sigmoid shaped yet.. Had held off traveling to consider pneumatic dilatation. Recently had manometry that showed type II achalasia with no peristalsis. She does have a history of nonischemic cardiomyopathy. Last ejection fraction 30%. Normal mole Cardy catheterization November 2016. Worsening peripheral vascular disease status post below the knee amputation on LEFT side earlier in the year.  She comes in today by herself in a wheelchair. She lives at a skilled nursing facility. [facility] She gets hemodialysis Tuesday Thursday Saturday. [facility].  Cardiac clearance done     Other Problems Lamar Laundry Bynum, CMA; 12/09/2014 10:39 AM) Arthritis Cerebrovascular Accident Chronic Renal Failure Syndrome Diabetes Mellitus Other disease, cancer, significant illness  Past Surgical History Ardeth Sportsman, MD; 12/09/2014 11:42 AM) Cataract Surgery Bilateral. Dialysis Shunt / Fistula TUBAL LIGATION, LAPAROSCOPIC (91478) ~70 y/o  Diagnostic Studies History Gilmer Mor, CMA; 12/09/2014 10:39 AM) Colonoscopy never Mammogram >3 years ago Pap Smear >5 years ago  Allergies Gilmer Mor, CMA;  12/09/2014 10:41 AM) No Known Drug Allergies10/18/2016  Medication History (Sonya Bynum, CMA; 12/09/2014 10:44 AM) BD Pen Needle Mini U/F (31G X 5 MM Misc,) Active. Besivance (0.6% Suspension, Ophthalmic) Active. Durezol (0.05% Emulsion, Ophthalmic) Active. Lantus SoloStar (100UNIT/ML Soln Pen-inj, Subcutaneous) Active. NovoLOG FlexPen (100UNIT/ML Soln Pen-inj, Subcutaneous) Active. Pantoprazole Sodium (  Tablet DR, Oral) Active. Pravastatin Sodium (  Tablet, Oral) Active. Renagel (  Tablet, Oral) Active. Ilevro (0.3% Suspension, Ophthalmic) Active. Carvedilol (6.25MG  Tablet, Oral) Active. Oxycodone-Acetaminophen (5-325MG  Tablet, Oral as needed) Active. Aspirin (  Tablet Chewable, Oral) Active. Medications Reconciled  Social History Gilmer Mor, CMA; 12/09/2014 10:39 AM) Alcohol use Remotely quit alcohol use. No caffeine use Tobacco use Never smoker.  Family History Gilmer Mor, CMA; 12/09/2014 10:39 AM) Alcohol Abuse Son. Arthritis Sister. Breast Cancer Family Members In General. Cerebrovascular Accident Family Members In General. Diabetes Mellitus Family Members In General. Hypertension Mother. Kidney Disease Family Members In General. Malignant Neoplasm Of Pancreas Family Members In General. Prostate Cancer Brother. Rectal Cancer Family Members In General.  Pregnancy / Birth History Gilmer Mor, CMA; 12/09/2014 10:39 AM) Age at menarche 13 years. Age of menopause 2-50 Gravida 2 Maternal age 10-30 Para 2    Review of Systems Lamar Laundry Bynum CMA; 12/09/2014 10:39 AM) General Present- Weight Loss. Not Present- Appetite Loss, Chills, Fatigue, Fever, Night Sweats and Weight Gain. Respiratory Present- Chronic Cough. Not Present- Bloody sputum, Difficulty Breathing, Snoring and Wheezing. Breast Not Present- Breast Mass, Breast Pain, Nipple Discharge and Skin Changes. Cardiovascular Not Present- Chest Pain, Difficulty Breathing  Lying Down, Leg Cramps, Palpitations, Rapid Heart Rate, Shortness of Breath and Swelling of Extremities. Gastrointestinal Present- Chronic diarrhea, Gets full quickly at meals and Nausea. Not Present- Abdominal Pain, Bloating, Bloody Stool, Change in Bowel Habits, Constipation, Difficulty Swallowing, Excessive gas, Hemorrhoids, Indigestion, Rectal Pain and Vomiting. Female Genitourinary Not Present- Frequency, Nocturia, Painful Urination, Pelvic  Pain and Urgency. Musculoskeletal Present- Joint Pain and Joint Stiffness. Not Present- Back Pain, Muscle Pain, Muscle Weakness and Swelling of Extremities. Neurological Present- Trouble walking. Not Present- Decreased Memory, Fainting, Headaches, Numbness, Seizures, Tingling, Tremor and Weakness. Psychiatric Not Present- Anxiety, Bipolar, Change in Sleep Pattern, Depression, Fearful and Frequent crying. Hematology Not Present- Easy Bruising, Excessive bleeding, Gland problems, HIV and Persistent Infections.  Vitals (Sonya Bynum CMA; 12/09/2014 10:40 AM) 12/09/2014 10:39 AM Weight: 87 lb Height: 63in Body Surface Area: 1.36 m Body Mass Index: 15.41 kg/m  Temp.: 44F(Temporal)  Pulse: 75 (Regular)  BP: 110/70 (Sitting, Left Arm, Standard)       Physical Exam Ardeth Sportsman MD; 12/09/2014 11:41 AM) General Mental Status-Alert. General Appearance-Not in acute distress, Not Sickly. Orientation-Oriented X3. Hydration-Well hydrated. Voice-Normal. Note: Alert. Cachectic.   Integumentary Global Assessment Upon inspection and palpation of skin surfaces of the - Axillae: non-tender, no inflammation or ulceration, no drainage. and Distribution of scalp and body hair is normal. General Characteristics Temperature - normal warmth is noted.  Head and Neck Head-normocephalic, atraumatic with no lesions or palpable masses. Face Global Assessment - atraumatic, no absence of expression. Neck Global Assessment - no abnormal  movements, no bruit auscultated on the right, no bruit auscultated on the left, no decreased range of motion, non-tender. Trachea-midline. Thyroid Gland Characteristics - non-tender.  Eye Eyeball - Left-Extraocular movements intact, No Nystagmus. Eyeball - Right-Extraocular movements intact, No Nystagmus. Cornea - Left-No Hazy. Cornea - Right-No Hazy. Sclera/Conjunctiva - Left-No scleral icterus, No Discharge. Sclera/Conjunctiva - Right-No scleral icterus, No Discharge. Pupil - Left-Direct reaction to light normal. Pupil - Right-Direct reaction to light normal.  ENMT Ears Pinna - Left - no drainage observed, no generalized tenderness observed. Right - no drainage observed, no generalized tenderness observed. Nose and Sinuses External Inspection of the Nose - no destructive lesion observed. Inspection of the nares - Left - quiet respiration. Right - quiet respiration. Mouth and Throat Lips - Upper Lip - no fissures observed, no pallor noted. Lower Lip - no fissures observed, no pallor noted. Nasopharynx - no discharge present. Oral Cavity/Oropharynx - Tongue - no dryness observed. Oral Mucosa - no cyanosis observed. Hypopharynx - no evidence of airway distress observed.  Chest and Lung Exam Inspection Movements - Normal and Symmetrical. Accessory muscles - No use of accessory muscles in breathing. Palpation Palpation of the chest reveals - Non-tender. Auscultation Breath sounds - Normal and Clear.  Cardiovascular Auscultation Rhythm - Regular. Murmurs & Other Heart Sounds - Auscultation of the heart reveals - No Murmurs and No Systolic Clicks.  Abdomen Inspection Inspection of the abdomen reveals - No Visible peristalsis and No Abnormal pulsations. Umbilicus - No Bleeding, No Urine drainage. Palpation/Percussion Palpation and Percussion of the abdomen reveal - Soft, Non Tender, No Rebound tenderness, No Rigidity (guarding) and No Cutaneous  hyperesthesia. Note: Thin and flat. No diastases. No incisions except small area at bellybutton consistent with prior tubal ligation. No umbilical hernia.   Female Genitourinary Sexual Maturity Tanner 5 - Adult hair pattern. Note: No vaginal bleeding nor discharge   Peripheral Vascular Upper Extremity Inspection - Left - No Cyanotic nailbeds, Not Ischemic. Right - No Cyanotic nailbeds, Not Ischemic.  Neurologic Neurologic evaluation reveals -normal attention span and ability to concentrate, able to name objects and repeat phrases. Appropriate fund of knowledge , normal sensation and normal coordination. Mental Status Affect - not angry, not paranoid. Cranial Nerves-Normal Bilaterally. Gait-Normal.  Neuropsychiatric Mental status exam performed with findings of-able to  articulate well with normal speech/language, rate, volume and coherence, thought content normal with ability to perform basic computations and apply abstract reasoning and no evidence of hallucinations, delusions, obsessions or homicidal/suicidal ideation.  Musculoskeletal Global Assessment Spine, Ribs and Pelvis - no instability, subluxation or laxity. Right Upper Extremity - no instability, subluxation or laxity. Note: Status post LEFT below the knee amputation. Sitting in wheelchair. Calm. Significant atrophy/cachexia of RIGHT lower extremity but can bear weight.   Lymphatic Head & Neck  General Head & Neck Lymphatics: Bilateral - Description - No Localized lymphadenopathy. Axillary  General Axillary Region: Bilateral - Description - No Localized lymphadenopathy. Femoral & Inguinal  Generalized Femoral & Inguinal Lymphatics: Left - Description - No Localized lymphadenopathy. Right - Description - No Localized lymphadenopathy.    Assessment & Plan  ACHALASIA (K22.0) Impression: Classic history and physical findings for achalasia. Confirmed by barium swallow, upper endoscopy, and  manometry.  She is losing weight and becoming cachectic. She would benefit from surgery to palliate her achalasia. Botox a short-term solution. She is not interested in pneumatic dilatation. Certainly from an anatomic standpoint she's not primitive. She's not had prior dilations nor Botox injections which should make the myotomy not too technically difficult.  However her numerous comorbidities including her renal failure with hemodialysis dependence and insulin-requiring diabetes increase her operative risks. She wishes to be aggressive and proceed with surgery since she is tolerated general anesthesia was numerous times this year.  I will plan to do this robotically at [facility] long with admission to [facility] postoperatively for management of dialysis and other issues.  Current Plans You are being scheduled for surgery - Our schedulers will call you.  You should hear from our office's scheduling department within 5 working days about the location, date, and time of surgery. We try to make accommodations for patient's preferences in scheduling surgery, but sometimes the OR schedule or the surgeon's schedule prevents [place] from making those accommodations.  If you have not heard from our office [phone]) in 5 working days, call the office and ask for your surgeon's nurse.  If you have other questions about your diagnosis, plan, or surgery, call the office and ask for your surgeon's nurse.  The anatomy & physiology of the foregut and anti-reflux mechanism was discussed. The pathophysiology of achalasia was discussed. Natural history risks without surgery was discussed. The patient's symptoms are not adequately controlled by non-operative treatments. I feel the risks of no intervention will lead to serious problems that outweigh the operative risks; therefore, I recommended surgery to relax the fibrotic LES & rebuild the anti-reflux valve to control reflux. Need for a thorough workup to  rule out the differential diagnosis and plan treatment was explained. I explained laparoscopic techniques with possible need for an open approach.  Risks such as bleeding, infection, abscess, leak, need for further treatment, heart attack, death, and other risks were discussed. I noted a good likelihood this will help address the problem. Goals of post-operative recovery were discussed as well. Possibility that this will not correct all symptoms was explained. Post-operative dysphagia, need for short-term liquid & pureed diet, inability to vomit, possibility of recurrence needing further treatment, possible need for medicines to help control symptoms in addition to surgery were discussed. We will work to minimize complications. Educational handouts further explaining the pathology, treatment options, and dysphagia diet was given as well. Questions were answered. The patient expresses understanding & wishes to proceed with surgery.  Pt Education - [facility] Achalasia HCI (  Allexa Acoff) Pt Education - [facility] Laparosopic Post Op HCI (Anaira Seay) Pt Education - [facility] Esophageal Surgery Diet HCI (Esbeydi Manago): discussed with patient and provided information. I recommended obtaining preoperative cardiac clearance. I am concerned about the health of the patient and the ability to tolerate the operation. Therefore, we will request clearance by cardiology to better assess operative risk & see if a reevaluation, further workup, etc is needed. Also recommendations on how medications such as for anticoagulation and blood pressure should be managed/held/restarted after surgery.  Ardeth Sportsman, M.D., F.A.C.S. Gastrointestinal and Minimally Invasive Surgery [facility] [address], [address] [place], [place] [address] [phone] Main / Paging

## 2015-03-06 NOTE — Progress Notes (Signed)
Utilization review completed.  L. J. Matyas Baisley RN, BSN, CM 

## 2015-03-06 NOTE — Op Note (Signed)
03/06/2015  12:12 PM  PATIENT:  Elizabeth [name]  70 y.o. female  Patient Care Team: Kirt Boys, DO as PCP - General (Internal Medicine) Primitivo Gauze, MD as Consulting Physician (Nephrology) Chrystie Nose, MD as Consulting Physician (Cardiology) Jeani Hawking, MD as Consulting Physician (Gastroenterology) Karie Soda, MD as Consulting Physician (General Surgery) Fransisco Hertz, MD as Consulting Physician (Vascular Surgery) Sharee Holster, NP as Nurse Practitioner (Geriatric Medicine) [facility] (Skilled Nursing Facility)  PRE-OPERATIVE DIAGNOSIS:  achalasia  POST-OPERATIVE DIAGNOSIS:  Achalasia  PROCEDURE:  Procedure(s): XI ROBOTIC HELLER ESOPHAGOCARDIOMYOTOMY PARTIAL POSTERIOR TOUPET FUNDOPLICATION  UPPER GI ENDOSCOPY  PROCEDURE PERFORMED: 1. Robotic Heller Anterior Esophagocardiomyotomy 2. Toupet (posterior 180deg) fundoplication x 4cm. 3. Anterior & posterior gastropexy 4. Type 2 mediastinal dissection. 5. EGD  SURGEON: Karie Soda, MD  Assist:  Luretha Murphy, MD  ANESTHESIA:   local and general  EBL:  Total I/O In: 450 [I.V.:400; IV Piggyback:50] Out: 5 [Urine:5]  Delay start of Pharmacological VTE agent (>24hrs) due to surgical blood loss or risk of bleeding:  no  ANESTHESIA: 1. General anesthesia. 2. Local anesthetic in a field block around all port sites.  SPECIMEN:  Mediastinal hernia sac (not sent).  DRAINS: None  COUNTS:  YES  PLAN OF CARE: Admit to inpatient   PATIENT DISPOSITION:  PACU - hemodynamically stable.  INDICATION:   Patient with symptomatic Achalasia.  Complex past history includes peripheral vascular disease and renal failure on dialysis.  Cardiac clearance done.  The patient has had extensive work-up & is felt to benefit from repair:  The anatomy & physiology of the foregut and anti-reflux mechanism was discussed.  The pathophysiology of achalasia was discussed.  Natural history risks without surgery  was discussed.   The patient's symptoms are not adequately controlled by non-operative treatments.  I feel the risks of no intervention will lead to serious problems that outweigh the operative risks; therefore, I recommended surgery to relax the fibrotic LES &  rebuild the anti-reflux valve to control reflux.  Need for a thorough workup to rule out the differential diagnosis and plan treatment was explained.  I explained laparoscopic techniques with possible need for an open approach.  Risks such as bleeding, infection, abscess, leak, need for further treatment, heart attack, death, and other risks were discussed.  I noted a good likelihood this will help address the problem.   Goals of post-operative recovery were discussed as well.  Possibility that this will not correct all symptoms was explained.  Post-operative dysphagia, need for short-term liquid & pureed diet, inability to vomit, possibility of recurrence needing further treatment, possible need for medicines to help control symptoms in addition to surgery were discussed.  We will work to minimize complications.   Educational handouts further explaining the pathology, treatment options, and dysphagia diet was given as well.  Questions were answered.  The patient expresses understanding & wishes to proceed with surgery.  OR FINDINGS:   No PEH hiatal hernia  It is a primary crural repair x  3   The myotomy is 7.5 cm long on the distal esophagus & 3 cm long on the cardia/anterior stomach.  The patient has a 4 cm Toupet (posterior 180 deg) fundoplication.  The patient has had bilateral anterior and posterior gastropexies.  DESCRIPTION:   Informed consent was confirmed.  The patient received IV antibiotics prior to incision.  The underwent general anesthesia without difficulty.  A Foley catheter was sterilely placed.  The patient was positioned  in split leg with arms tucked.  The abdomen was prepped and draped in the sterile fashion.  Surgical  time-out confirmed our plan.  I used a Varess technique in the left subcostal region.  No blood & flushed well.  Induced capnoperitoneum.  After 15 mmHg pressure, an 8mm Xi port was placed in the left lateral upper quadrant.  Entry was clean.  Inspection revealed no injury.  Additional 8 mm robotic ports were placed in the abdomen.  Did have to relocate the robotic ports the operative and she had a small abdominal cavity and a large left lateral sector of the liver that limited visualization at first.  I also placed a 5 mm port in the left subxiphoid region under direct visualization.  I removed that and placed an Omega-shaped rigid Nathanson liver retractor to lift the left lateral sector of the liver anteriorly to expose the esophageal hiatus.  This was secured to the bed using the iron man system.   I entered through the thin hepatogastric ligament.  Patient had a replaced right hepatic artery.  This was left intact.  Skeletonized for some mobility.  I could get into the anterior mediastinum between the right crus and esophagus.  I used primarily focused gentle blunt dissection as well as focused vessel sealer dissection.  I transected phrenoesophageal attachments to the inner right crus sac until I found the base of the crura.  I then came around anteriorly on the left side and freed up the phrenoesophageal attachments on the medial part of the left crus on the superior half.   We ligated the short gastrics along the lesser curvature of the stomach about a third way down and then came up over the fundus.  We released the attachments of the stomach to the retroperitoneum until we were able to connect with the prior dissection on the left crus.  We completed the release of phrenoesophageal attachments to the medial part of the left crus down to its base.  With this, we had circumferential mobilization.    We placed the stomach and esophagus on axial tension.  I then did a type 2 mediastinal dissection  where I freed the esophagus from its attachments to the aorta, pleura, and pericardium using primarily gentle blunt as well as focused ultrasonic dissection.  We saw the anterior vagus nerve & preserved if anteriorly.  The posterior vagus nerve was intact.  We preserved it at all times.  With that I could straighten out the esophagus and get 4 cm of intra-abdominal length of the esophagus at a best estimation.  The esophagus rather dilated in the mediastinum consistent with chronic inflammation & achalasia.  I mobilized the anterior vagus nerve off the esophagus as it crossed over more towards the left more proximally.  This help expose the anterior esophagus and anterior cardia.  Patient did not have any significant epiphrenic pads of this were left alone.  I could identify the esophagogastric junction.  I brought the fundus of the stomach posterior to the esophagus over to the right side.  The wrap was mobile with the classic shoe shinebmaneuver.  Posterior wrap became together gently.  We then proceeded with myotomy using focused blunt dissection and hook cautery.  I carefully split the longitudinal fibers of the cardia and distal esophagus.  I came underneath the circular ring fibers of the esophagus.  They were rather fibrotic.  I freed off the mucosa.  Transected across them using some gentle avulsion technique as well as  focused hook dissection with the blade up.  We continued more proximally up above the esophageal hiatus.  This took some time as there was some moderate scarring in the distal esophagus for 3 cm.  I ended up extending the myotomy more proximally until I came to more soft and pink normal circular fibers, implying I was above the most strictured and fibrotic area.  Esophagus had dilated out as well consistent with proximal to the fibrotic region of achalasia.  This is around 7.5 cm from the esophagogastric junction.  Then redirected and proceeded to free off the fibers off the anterior  cardia of the stomach, taking care to avoid entry into the mucosa.  Skeletonized and freed off oblique fibers.  Measure the length of myotomy and it was 7.5 cm on the esophagus and 3 cm onto the cardia.    I meticulously reinspected.  There is no evidence of perforation.  Branching vessels on the mucosa were carefully elevated and transected.  I direction visualization with good robotic camera work.  Dr. Daphine Deutscher did upper endoscopy.  No significant retained food in the dilated esophagus.  Stable to dilate the soft tissue seen come across the myotomy.  Was good anterior 120-180 degree myotomy.  There is no leak of air on inspection arguing against any leak.  Gas was aspirated.  We then reflected the stomach left laterally and closed the esophageal hiatus using 0 Ethibond stitch.  I did that x2 stitches, Incorporating the posterior part of the wrap for a good posterior gastropexy 2. The crura had good substance and they came together well without any tension.  We avoided making it too snug.  I then did a left anterior gastropexy taking the apex of the left side of the wrap and tacked it to the left anterior crura just posterior to the phrenic vein.  Also a bite of the left anterior side of the myotomy.  I tied that down.  I did a similar stitch on the right anterior aspect in a mirror-image fashion.  This helped the wrap for completion.  I did 3 more pairs of stitches distally, taking a bite of the side of the wrap with a myotomy for a posterior Toupet fundoplication.  The last stitch was actually on the cardia of the stomach to help keep the myotomy open.  I measured the length of the fundoplication.  It was 4 cm.  This help to allow the mucosa to be well exposed and the myotomy to stay wide open.  Again no evidence of injury to the mucosa.  I did irrigation and ensured hemostasis on the liver.  I saw no evidence of any leak or perforation or other abnormality.  Esophageal hiatus was wide enough to allow a  large blunt grasper through the closure anteriorly, arguing against a tight closure.  No evidence of any mucosal gastric or bowel injury.  we removed the [facility] liver retractor under direct visualization.  Hemostasis was good.I evacuated carbon dioxide and removed the ports.  The skin was closed with Monocryl and sterile dressings applied.  The patient is being extubated and brought back to the recovery room.  I had discussed postop care in detail with the patient in in the office.  Discussed again with the patient in the holding area.  Instructions are written. The patient has no one here.  I did cause granddaughter and discussed with her Elizabeth [name] [phone]).  Explaining the surgery and postop recovery.  Questions answered.  She expressed understanding  and appreciation.  We will transfer the patient to [facility] for needs hemodialysis.  She can set on Tuesday, Thursday, Saturday.  Discussed with [facility].  Dr. Arlean Hopping.  They will see the patient and help get dialysis going tomorrow.    Ardeth Sportsman, M.D., F.A.C.S. Gastrointestinal and Minimally Invasive Surgery [facility] [address], [address] [place], [place] [address] [phone] Main / Paging

## 2015-03-06 NOTE — Anesthesia Procedure Notes (Signed)
Procedure Name: Intubation Date/Time: 03/06/2015 7:45 AM Performed by: Orest Dikes Pre-anesthesia Checklist: Patient identified, Emergency Drugs available, Suction available and Patient being monitored Patient Re-evaluated:Patient Re-evaluated prior to inductionOxygen Delivery Method: Circle System Utilized Preoxygenation: Pre-oxygenation with 100% oxygen Intubation Type: IV induction, Cricoid Pressure applied and Rapid sequence Laryngoscope Size: Mac and 4 Grade View: Grade I Tube type: Oral Tube size: 7.0 mm Number of attempts: 1 Airway Equipment and Method: Stylet Placement Confirmation: ETT inserted through vocal cords under direct vision,  positive ETCO2 and breath sounds checked- equal and bilateral Secured at: 21 cm Tube secured with: Tape Dental Injury: Teeth and Oropharynx as per pre-operative assessment

## 2015-03-06 NOTE — Interval H&P Note (Signed)
History and Physical Interval Note:  03/06/2015 7:27 AM  Elizabeth [name]  has presented today for surgery, with the diagnosis of achalasia  The various methods of treatment have been discussed with the patient and family. After consideration of risks, benefits and other options for treatment, the patient has consented to  Procedure(s): XI ROBOTIC HELLER CARDIOMYOTOMY AND PARTIAL TOUPET FUNDOPLICATION EGD (N/A) as a surgical intervention .  The patient's history has been reviewed, patient examined, no change in status, stable for surgery.  I have reviewed the patient's chart and labs.  Questions were answered to the patient's satisfaction.     [name]

## 2015-03-06 NOTE — Consult Note (Signed)
Renal Service Consult Note [facility]  Elizabeth [name] 03/06/2015 Eagan Shifflett D Requesting Physician:  Dr Michaell Cowing  Reason for Consult:  ESRD s/p surgery for achalasia HPI: The patient is a 70 y.o. year-old with hx of CAD, HTN, L BKA, CVA and ESRD on HD who presents for elective surgery for achalasia.  Recent manometry showed type II achalasia w no peristalsis.  Last EF 35%.  Nomral heart cath last year. Lives in SNF, WC mostly, occ ambulates w prosthetic leg.    Asked to see for her ESRD on HD TTS.    ROS  no joint pain, no HA or rash  no swelling  no cough of SOB  no abd pain    Past Medical History  Past Medical History  Diagnosis Date  . Coronary artery disease, non-occlusive January 2016    Moderate p&mLAD & Ostial OM; Myoview Nuclear Stress Test 03/21/2014: EF 35%. Mild hypokinesis of the inferior wall with fixed inferior defect suggesting scar. (no culprit lesion on CATH)  . 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 Critical lower limb ischemia     Status post left BKA following left popliteal and peroneal artery endarterectomy with patch angioplasty.  . Diabetic neuropathy (HCC)   . Essential hypertension   . Hyperlipidemia with target LDL less than 70   . History of CVA (cerebrovascular accident) 08/19/2013    "minor one"  . ESRD (end stage renal disease) (HCC)     [address], [place], S (06/12/2014)  . Urinary retention     DX NEUROGENIC BLADDER--HAS INDWELLING FOLEY CATHETER  . Foley catheter in place     for urinary retention  . Anemia   . Hematemesis April 2016    Hospitalized  . Arthritis of knee, right     "right knee" (06/12/2014)  . Gout     "was in my LLE"  . Diabetes mellitus, insulin dependent (IDDM), uncontrolled (HCC)     Brittle; has h/o Hypo &  Hyperglycemic episodes, Type II  . Foot infection   . Loss of consciousness 08/19/2013  . UTI (urinary tract infection) 10/17/2013  . Acute hyperkalemia 12/24/2013   Past Surgical History  Past Surgical History  Procedure Laterality Date  . Insertion of suprapubic catheter N/A 05/16/2012    Procedure: INSERTION OF SUPRAPUBIC CATHETER;  Surgeon: Sebastian Ache, MD;  Location: [facility] ORS;  Service: Urology;  Laterality: N/A;  . Bascilic vein transposition Left 10/30/2012    Procedure: LEFT 1ST STAGE BASCILIC VEIN TRANSPOSITION;  Surgeon: Chuck Hint, MD;  Location: [facility] OR;  Service: Vascular;  Laterality: Left;  . Nm myocar perf wall motion  04/19/2012/ 02/2014    a) low risk study-EF 44%,mild inferolateral hypkinesis; b) EF 35%. Mild hypokinesis of the inferior wall with fixed inferior defect suggesting scar.  . Bascilic vein transposition Left 02/05/2013    Procedure: BASCILIC VEIN TRANSPOSITION 2ND STAGE;  Surgeon: Chuck Hint, MD;  Location: [facility] OR;  Service: Vascular;  Laterality: Left;  . Radiology with anesthesia N/A 10/25/2013    Procedure: RADIOLOGY WITH ANESTHESIA;  Surgeon: Medication Radiologist, MD;  Location: [facility] OR;  Service: Radiology;  Laterality: N/A;  . Fistulogram Left 04/29/2013    Procedure: FISTULOGRAM;  Surgeon: Chuck Hint, MD;  Location: [facility] CATH LAB;  Service: Cardiovascular;  Laterality: Left;  . Angioplasty Left 04/29/2013  Procedure: ANGIOPLASTY;  Surgeon: Chuck Hint, MD;  Location: [facility] CATH LAB;  Service: Cardiovascular;  Laterality: Left;  . Abdominal aortagram N/A 03/19/2014    Procedure: ABDOMINAL Ronny Flurry;  Surgeon: Sherren Kerns, MD;  Location: [facility] CATH LAB;  Service: Cardiovascular;  Laterality: N/A;  . Left and right heart catheterization with coronary angiogram N/A 03/24/2014    Procedure: LEFT AND RIGHT HEART CATHETERIZATION WITH CORONARY ANGIOGRAM;  Surgeon: Marykay Lex, MD;  Location: [facility] CATH LAB;  Service: Cardiovascular;  Moderate disease in p-mLAD & Ostial OM1; normal right heart pressures. Wedge pressure 29 mmHg. --No lesion to explain inferior defect on Myoview and echocardiogram.   . Endarterectomy popliteal Left 03/26/2014    Procedure: Left popliteal and peroneal artery endarterectomy with vein patch angioplasty;  Surgeon: Sherren Kerns, MD;  Location: [facility] OR;  Service: Vascular;  Laterality: Left;  . Amputation Left 03/26/2014    Procedure: AMPUTATION SECOND LEFT TOE;  Surgeon: Sherren Kerns, MD;  Location: [facility] OR;  Service: Vascular;  Laterality: Left;  . Av fistula placement Left 04/28/2014    Procedure: CREATION OF LEFT BRACHIOCEPHALIC  ARTERIOVENOUS (AV) FISTULA CREATION;  Surgeon: Fransisco Hertz, MD;  Location: [facility] OR;  Service: Vascular;  Laterality: Left;  . Amputation Left 05/10/2014    Procedure: AMPUTATION BELOW KNEE;  Surgeon: Larina Earthly, MD;  Location: [facility] OR;  Service: Vascular;  Laterality: Left;  . Esophagogastroduodenoscopy N/A 06/13/2014    Procedure: ESOPHAGOGASTRODUODENOSCOPY (EGD);  Surgeon: Jeani Hawking, MD;  Location: [facility] ENDOSCOPY;  Service: Endoscopy;  Laterality: N/A;  . Transthoracic echocardiogram  03/22/2014    EF 30-35%, mild LVH. Severe inferior hypokinesis. Mild MR.  Marland Kitchen Below knee leg amputation Left   . Fistulogram Left 10/08/2014    Procedure: LEFT ARM FISTULOGRAM;  Surgeon: Fransisco Hertz, MD;  Location: [facility] OR;  Service: Vascular;  Laterality: Left;  . Venoplasty Left 10/08/2014    Procedure: LEFT CEPHALIC VEIN VENOPLASTY;  Surgeon: Fransisco Hertz, MD;  Location: [facility] OR;  Service: Vascular;  Laterality: Left;  . Incision and drainage of wound N/A 10/29/2014    Procedure: IRRIGATION AND DEBRIDEMENT OF SACRAL WOUND, PLACEMENT OF A-CELL;  Surgeon: Wayland Denis, DO;  Location: [facility] OR;  Service: Plastics;  Laterality: N/A;  . Application of a-cell of extremity N/A 10/29/2014    Procedure: PLACEMENT OF APPLICATION OF A-CELL;  Surgeon: Wayland Denis, DO;  Location: [facility] OR;  Service: Plastics;   Laterality: N/A;  . Esophageal manometry N/A 11/24/2014    Procedure: ESOPHAGEAL MANOMETRY (EM);  Surgeon: Jeani Hawking, MD;  Location: [facility] ENDOSCOPY;  Service: Endoscopy;  Laterality: N/A;   Family History  Family History  Problem Relation Age of Onset  . Hyperlipidemia Mother   . Hypertension Mother   . Hodgkin's lymphoma Father   . Heart disease Sister    Social History  reports that she has never smoked. She has never used smokeless tobacco. She reports that she does not drink alcohol or use illicit drugs. Allergies No Known Allergies Home medications Prior to Admission medications   Medication Sig Start Date End Date Taking? Authorizing Provider  aspirin EC 81 MG tablet Take 81 mg by mouth daily.   Yes Historical Provider, MD  cetirizine (ZYRTEC) 10 MG tablet Take 10 mg by mouth daily as needed for allergies.    Yes Historical Provider, MD  insulin glargine (LANTUS) 100 UNIT/ML injection Inject 0.03 mLs (3 Units total) into the skin daily. Patient taking differently: Inject 3-6 Units into  the skin See admin instructions. Pt injects 6 units in the morning and 3 units at bedtime 06/29/14  Yes Vassie Loll, MD  lidocaine-prilocaine (EMLA) cream Apply 1 application topically Every Tuesday,Thursday,and Saturday with dialysis.    Yes Historical Provider, MD  Multiple Vitamins-Minerals (DECUBI-VITE) CAPS Take 2 capsules by mouth daily.   Yes Historical Provider, MD  oxyCODONE-acetaminophen (PERCOCET/ROXICET) 5-325 MG per tablet Take one tablet by mouth every 8 hours as needed for pain. Not to exceed 3gm APAP from all sources/24 hours Patient taking differently: Take 1 tablet by mouth every 8 (eight) hours as needed for moderate pain or severe pain. . Not to exceed 3gm APAP from all sources/24 hours 09/02/14  Yes Sharon Seller, NP  pantoprazole (PROTONIX) 40 MG tablet Take 1 tablet (40 mg total) by mouth daily. 05/13/14  Yes Joseph Art, DO  pravastatin (PRAVACHOL) 20 MG tablet Take 20 mg by  mouth every evening.    Yes Historical Provider, MD  promethazine (PHENERGAN) 25 MG tablet Take 25 mg by mouth every 6 (six) hours as needed for nausea or vomiting.   Yes Historical Provider, MD  senna (SENOKOT) 8.6 MG TABS tablet Take 1 tablet by mouth 2 (two) times daily.   Yes Historical Provider, MD  sevelamer carbonate (RENVELA) 800 MG tablet Take 1 tablet (800 mg total) by mouth 3 (three) times daily with meals. 12/28/13  Yes Ripudeep Jenna Luo, MD  glucose blood (ONETOUCH VERIO) test strip Use as instructed to check blood sugar 4 times per day dx code E13.10 02/17/14   [name], MD  insulin aspart (NOVOLOG) 100 UNIT/ML injection Inject 0-9 Units into the skin 3 (three) times daily with meals. Sliding scale CBG 70 - 120: 0 units CBG 121 - 150: 1 unit,  CBG 151 - 200: 2 units,  CBG 201 - 250: 3 units,  CBG 251 - 300: 5 units,  CBG 301 - 350: 7 units,  CBG 351 - 400: 9 units   CBG > 400: 9 units and notify your MD Patient taking differently: Inject 5-8 Units into the skin See admin instructions. Pt injects 8 units in the morning, 5 units before lunch and 8 units in the evening 06/16/14   Ripudeep Jenna Luo, MD  [facility] DELICA LANCETS FINE MISC Use to check blood sugar 4 times per day Patient not taking: Reported on 02/06/2015 09/16/13   [name], MD  oxyCODONE (OXY IR/ROXICODONE) 5 MG immediate release tablet Take 1-2 tablets (5-10 mg total) by mouth every 4 (four) hours as needed for moderate pain, severe pain or breakthrough pain. 03/06/15   Karie Soda, MD   Liver Function Tests No results for input(s): AST, ALT, ALKPHOS, BILITOT, PROT, ALBUMIN in the last 168 hours. No results for input(s): LIPASE, AMYLASE in the last 168 hours. CBC  Recent Labs Lab 03/06/15 0530  WBC 5.2  HGB 11.2*  HCT 35.7*  MCV 85.0  PLT 190   Basic Metabolic Panel  Recent Labs Lab 03/06/15 0530  NA 139  K 4.6  CL 100*  CO2 26  GLUCOSE 107*  BUN 37*  CREATININE 3.31*  CALCIUM 8.2*    Filed Vitals:    03/06/15 1500 03/06/15 1530 03/06/15 1545 03/06/15 1600  BP: 110/63   105/60  Pulse: 99 112 110 110  Temp:  97.7 F (36.5 C)    Resp: Height:      Weight:      SpO2: 99% 100% 99% 97%  Exam Thin adult female, no distress post up No rash, cyanosis or gangrene Sclera anicteric, throat clear No jvd Chest clear bilat no rales RRR no mrg Abd soft ntnd no mass or ascite GU indwelling foley cath Ext no LE or UE edema LUA AVF +bruit Neuro is alert, Ox 3    TTS [place]  3.5h  F160   350/500  37kg   2//25 bath LUA AVF  Heparin 2000 Hect 3 ug Hb 13.7  tfs 18%  Alb 2.9  ipth 435  P 5-8   Assessment: 1 SP surgery (myotomy/ partial fundoplication) 2 ESRD  3 Vol up <1kg 4 PAD w L BKA, hx CVA 5 DM 2     Plan - HD tomorrow at [facility] MD [facility] pager [phone]    cell [phone] 03/06/2015, 4:23 PM

## 2015-03-06 NOTE — Progress Notes (Signed)
Elizabeth [name] is a 70 y.o. female patient admitted from ED awake, alert - oriented  X 4 - no acute distress noted.  VSS - Blood pressure 107/74, pulse 120, temperature 97.5 F (36.4 C), temperature source Oral, resp. rate 16, height 5\' 3"  (1.6 m), weight 40.3 kg (88 lb 13.5 oz), SpO2 100 %.    IV in place, occlusive dsg intact without redness.  Orientation to room, and floor completed with information packet given to patient/family.  Patient declined safety video at this time.  Admission INP armband ID verified with patient/family, and in place.   SR up x 2, fall assessment complete, with patient and family able to verbalize understanding of risk associated with falls, and verbalized understanding to call nsg before up out of bed.  Call light within reach, patient able to voice, and demonstrate understanding.    Abdominal incision dressings clean dry and intact. Pressure sore noted on sacrum.   Will cont to eval and treat per MD orders.  Kendall Flack, RN 03/06/2015 6:35 PM

## 2015-03-06 NOTE — Progress Notes (Signed)
Report received for patient to be admitted to [facility] [phone]

## 2015-03-06 NOTE — Anesthesia Postprocedure Evaluation (Signed)
Anesthesia Post Note  Patient: Elizabeth [name]  Procedure(s) Performed: Procedure(s) (LRB): XI ROBOTIC HELLER ESOPHAGOCARDIOMYOTOMY AND PARTIAL TOUPET FUNDOPLICATION  (N/A) UPPER GI ENDOSCOPY  Patient location during evaluation: PACU Anesthesia Type: General Level of consciousness: awake and alert Pain management: pain level controlled Vital Signs Assessment: post-procedure vital signs reviewed and stable Respiratory status: spontaneous breathing Cardiovascular status: blood pressure returned to baseline Anesthetic complications: no    Last Vitals:  Filed Vitals:   03/06/15 1545 03/06/15 1600  BP:  105/60  Pulse: 110 110  Temp:    Resp: 14 16    Last Pain:  Filed Vitals:   03/06/15 1602  PainSc: Ardean Larsen

## 2015-03-07 ENCOUNTER — Inpatient Hospital Stay (HOSPITAL_COMMUNITY): Payer: Medicare Other

## 2015-03-07 ENCOUNTER — Encounter (HOSPITAL_COMMUNITY): Payer: Self-pay | Admitting: Nephrology

## 2015-03-07 LAB — BASIC METABOLIC PANEL
Anion gap: 13 (ref 5–15)
BUN: 41 mg/dL — ABNORMAL HIGH (ref 6–20)
CALCIUM: 8 mg/dL — AB (ref 8.9–10.3)
CO2: 25 mmol/L (ref 22–32)
CREATININE: 4.28 mg/dL — AB (ref 0.44–1.00)
Chloride: 100 mmol/L — ABNORMAL LOW (ref 101–111)
GFR calc Af Amer: 11 mL/min — ABNORMAL LOW (ref >60)
GFR calc non Af Amer: 10 mL/min — ABNORMAL LOW (ref >60)
GLUCOSE: 146 mg/dL — AB (ref 65–99)
Potassium: 5.5 mmol/L — ABNORMAL HIGH (ref 3.5–5.1)
Sodium: 138 mmol/L (ref 135–145)

## 2015-03-07 LAB — CBC
HCT: 33.6 % — ABNORMAL LOW (ref 36.0–46.0)
Hemoglobin: 10.6 g/dL — ABNORMAL LOW (ref 12.0–15.0)
MCH: 27.1 pg (ref 26.0–34.0)
MCHC: 31.5 g/dL (ref 30.0–36.0)
MCV: 85.9 fL (ref 78.0–100.0)
Platelets: 159 10*3/uL (ref 150–400)
RBC: 3.91 MIL/uL (ref 3.87–5.11)
RDW: 19.1 % — AB (ref 11.5–15.5)
WBC: 9.2 10*3/uL (ref 4.0–10.5)

## 2015-03-07 LAB — HEMOGLOBIN A1C
HEMOGLOBIN A1C: 7.5 % — AB (ref 4.8–5.6)
Mean Plasma Glucose: 169 mg/dL

## 2015-03-07 LAB — GLUCOSE, CAPILLARY
GLUCOSE-CAPILLARY: 123 mg/dL — AB (ref 65–99)
GLUCOSE-CAPILLARY: 84 mg/dL (ref 65–99)
GLUCOSE-CAPILLARY: 141 mg/dL — AB (ref 65–99)
Glucose-Capillary: 59 mg/dL — ABNORMAL LOW (ref 65–99)
Glucose-Capillary: 36 mg/dL — CL (ref 65–99)
Glucose-Capillary: 148 mg/dL — ABNORMAL HIGH (ref 65–99)

## 2015-03-07 MED ORDER — ALTEPLASE 2 MG IJ SOLR: 2.0000 mg | Freq: Once | INTRAMUSCULAR | Status: DC | PRN

## 2015-03-07 MED ORDER — SODIUM CHLORIDE 0.9 % IV SOLN: 100.0000 mL | INTRAVENOUS | Status: DC | PRN

## 2015-03-07 MED ORDER — HEPARIN SODIUM (PORCINE) 1000 UNIT/ML DIALYSIS: 1000.0000 [IU] | INTRAMUSCULAR | Status: DC | PRN

## 2015-03-07 MED ORDER — PENTAFLUOROPROP-TETRAFLUOROETH EX AERO: 1.0000 "application " | INHALATION_SPRAY | CUTANEOUS | Status: DC | PRN

## 2015-03-07 MED ORDER — LIDOCAINE HCL (PF) 1 % IJ SOLN: 5.0000 mL | INTRAMUSCULAR | Status: DC | PRN

## 2015-03-07 MED ORDER — DEXTROSE 50 % IV SOLN
INTRAVENOUS | Status: AC
  Administered 2015-03-07: 50 mL
  Filled 2015-03-07: qty 50

## 2015-03-07 MED ORDER — HEPARIN SODIUM (PORCINE) 1000 UNIT/ML DIALYSIS: 2000.0000 [IU] | Freq: Once | INTRAMUSCULAR | Status: DC

## 2015-03-07 MED ORDER — LIDOCAINE-PRILOCAINE 2.5-2.5 % EX CREA: 1.0000 "application " | TOPICAL_CREAM | CUTANEOUS | Status: DC | PRN

## 2015-03-07 NOTE — Progress Notes (Signed)
Radiology called me concerning UGI that was ordered for today.  It will be done tomorrow am.

## 2015-03-07 NOTE — Progress Notes (Signed)
Physical Therapy Note  Patient taken to hemodialysis. Will check back for physical therapy evaluation.  [address] [place], [place] [phone]   03/07/2015 12:45

## 2015-03-07 NOTE — Progress Notes (Signed)
Patient ID: Elizabeth [name], female   DOB: Nov 11, 1945, 70 y.o.   MRN: [id number]  General Surgery [facility]  POD#: 1  Subjective: Patient in bed, awakens to voice, comfortable.  Tolerating full liquid diet (?).  Objective: Vital signs in last 24 hours: Temp:  [97.4 F (36.3 C)-98.2 F (36.8 C)] 98.2 F (36.8 C) (01/14 6578) Pulse Rate:  [95-131] 114 (01/14 0608) Resp:  [8-18] 16 (01/14 0608) BP: (86-123)/(46-74) 94/61 mmHg (01/14 0608) SpO2:  [96 %-100 %] 97 % (01/14 4696) Arterial Line BP: (110-123)/(46-54) 110/46 mmHg (01/13 1500) Weight:  [40.3 kg (88 lb 13.5 oz)] 40.3 kg (88 lb 13.5 oz) (01/14 0900) Last BM Date: 03/05/15  Intake/Output from previous day: 01/13 0701 - 01/14 0700 In: 1150 [P.O.:150; I.V.:950; IV Piggyback:50] Out: 73 [Urine:73] Intake/Output this shift: Total I/O In: 121 [P.O.:118; I.V.:3] Out: -   Physical Exam: HEENT - sclerae clear, mucous membranes moist Neck - soft Chest - clear bilaterally Cor - RRR Abdomen - soft without distension; dressings dry and intact; mild tenderness LUQ Ext - no edema, non-tender Neuro - alert & oriented, no focal deficits  Lab Results:   Recent Labs  03/06/15 0530 03/07/15 0455  WBC 5.2 9.2  HGB 11.2* 10.6*  HCT 35.7* 33.6*  PLT 190 159   BMET  Recent Labs  03/06/15 0530 03/07/15 0455  NA 139 138  K 4.6 5.5*  CL 100* 100*  CO2 26 25  GLUCOSE 107* 146*  BUN 37* 41*  CREATININE 3.31* 4.28*  CALCIUM 8.2* 8.0*   PT/INR No results for input(s): LABPROT, INR in the last 72 hours. Comprehensive Metabolic Panel:    Component Value Date/Time   NA 138 03/07/2015 0455   NA 139 03/06/2015 0530   K 5.5* 03/07/2015 0455   K 4.6 03/06/2015 0530   CL 100* 03/07/2015 0455   CL 100* 03/06/2015 0530   CO2 25 03/07/2015 0455   CO2 26 03/06/2015 0530   BUN 41* 03/07/2015 0455   BUN 37* 03/06/2015 0530   CREATININE 4.28* 03/07/2015 0455   CREATININE 3.31* 03/06/2015 0530   GLUCOSE  146* 03/07/2015 0455   GLUCOSE 107* 03/06/2015 0530   CALCIUM 8.0* 03/07/2015 0455   CALCIUM 8.2* 03/06/2015 0530   AST 77* 08/13/2014 1218   AST 23 06/26/2014 0258   ALT 101* 08/13/2014 1218   ALT 25 06/26/2014 0258   ALKPHOS 171* 08/13/2014 1218   ALKPHOS 105 06/26/2014 0258   BILITOT 0.9 08/13/2014 1218   BILITOT 0.3 06/26/2014 0258   PROT 7.8 08/13/2014 1218   PROT 5.9* 06/26/2014 0258   ALBUMIN 3.4* 08/13/2014 1218   ALBUMIN 2.2* 06/28/2014 2952    Studies/Results: No results found.  Anti-infectives: Anti-infectives    Start     Dose/Rate Route Frequency Ordered Stop   03/06/15 0514  cefTRIAXone (ROCEPHIN) 2 g in dextrose 5 % 50 mL IVPB     2 g 100 mL/hr over 30 Minutes Intravenous On call to O.R. 03/06/15 8413 03/06/15 0810   03/06/15 0514  metroNIDAZOLE (FLAGYL) IVPB 500 mg     500 mg 100 mL/hr over 60 Minutes Intravenous On call to O.R. 03/06/15 0514 03/06/15 0755      Assessment & Plans: Status post Heller myotomy and fundoplication  Gastrograffin UGI series this AM  Already on diet - ?orders  Will advance to pureed diet if UGI study without complication ESRD  For HD later today  Velora Heckler, MD, [facility] Office: [phone]   [name] Judie Petit 03/07/2015

## 2015-03-07 NOTE — Progress Notes (Signed)
  [facility] Progress Note   Subjective: no complaints, no abd pain or SOB  Filed Vitals:   03/06/15 2219 03/07/15 0136 03/07/15 0608 03/07/15 0900  BP: 100/58 100/59 94/61   Pulse:  131 114   Temp:  98.2 F (36.8 C) 98.2 F (36.8 C)   TempSrc:  Oral Oral   Resp:  17 16   Height:    5\' 3"  (1.6 m)  Weight:    40.3 kg (88 lb 13.5 oz)  SpO2:  97% 97%     Inpatient medications: . acetaminophen  1,000 mg Oral TID  . aspirin EC  81 mg Oral Daily  . heparin subcutaneous  5,000 Units Subcutaneous 3 times per day  . insulin aspart  0-5 Units Subcutaneous QHS  . insulin aspart  0-9 Units Subcutaneous TID WC  . insulin glargine  3 Units Subcutaneous BID  . lidocaine-prilocaine   Topical Q T,Th,Sa-HD  . lip balm  1 application Topical BID  . loratadine  10 mg Oral Daily  . multivitamin with minerals  1 tablet Oral Daily  . pantoprazole  40 mg Oral Daily  . pravastatin  20 mg Oral QPM  . senna  1 tablet Oral BID  . sodium chloride  3 mL Intravenous Q12H     sodium chloride, bisacodyl, diphenhydrAMINE, HYDROmorphone (DILAUDID) injection, lactated ringers, magic mouthwash, menthol-cetylpyridinium, metoCLOPramide (REGLAN) injection, metoprolol, oxyCODONE, phenol, polyethylene glycol, promethazine, sodium chloride  Exam: Thin adult female, no distress, calm, moderately frail, a bit tremulous Sclera anicteric, throat clear No jvd Chest clear bilat no rales RRR no mrg Abd soft ntnd no mass or ascite GU indwelling foley cath Ext no LE or UE edema LUA AVF +bruit Neuro is alert, Ox 3    TTS [place] 3.5h F160 350/500 37kg 2//25 bath LUA AVF Heparin 2000 Hect 3 ug Hb 13.7 tfs 18% Alb 2.9 ipth 435 P 5-8   Assessment: 1 SP surgery (myotomy/ partial fundoplication)- POD #1 2 ESRD HD tts 3 Vol up 3kg by wt 4 PAD / L BKA/ hx CVA 5 DM 2 on insulin   Plan - HD today, UF 1-2 kg as Caryl Pina MD [facility] pager [phone]    cell  [phone] 03/07/2015, 9:55 AM    Recent Labs Lab 03/06/15 0530 03/07/15 0455  NA 139 138  K 4.6 5.5*  CL 100* 100*  CO2 26 25  GLUCOSE 107* 146*  BUN 37* 41*  CREATININE 3.31* 4.28*  CALCIUM 8.2* 8.0*   No results for input(s): AST, ALT, ALKPHOS, BILITOT, PROT, ALBUMIN in the last 168 hours.  Recent Labs Lab 03/06/15 0530 03/07/15 0455  WBC 5.2 9.2  HGB 11.2* 10.6*  HCT 35.7* 33.6*  MCV 85.0 85.9  PLT 190 159

## 2015-03-07 NOTE — Evaluation (Addendum)
Physical Therapy Evaluation and Discharge Patient Details Name: Elizabeth [name] MRN: [id number] DOB: 03-29-1945 Today's Date: 03/07/2015   History of Present Illness  70 y.o. female with hx of left BKA, s/p XI ROBOTIC HELLER ESOPHAGOCARDIOMYOTOMY AND PARTIAL TOUPET FUNDOPLICATION. PMH of CVA, chronic renal failure syndrome, and DM.  Clinical Impression  Patient evaluated by Physical Therapy with no further acute PT needs identified. All education has been completed and the patient has no further questions. Reports she is at her baseline with ability to transfer unassisted which she performed several times today, demonstrating a squat pivot and stand pivot transfer. Pt does use a prosthesis at ALF but leaves this device at her facility. States she feels back to baseline with mobility and has no further concerns. Denies any falls in the past 6 months. States she does not receive physical therapy at her living facility nor does she need this service currently. PT is signing off. Thank you for this referral.     Follow Up Recommendations No PT follow up (Return to ALF)    Equipment Recommendations  None recommended by PT    Recommendations for Other Services       Precautions / Restrictions Precautions Precautions: None Restrictions Weight Bearing Restrictions: No      Mobility  Bed Mobility Overal bed mobility: Independent                Transfers Overall transfer level: Modified independent Equipment used: Rolling walker (2 wheeled);None Transfers: Sit to/from [employer] Transfers Sit to Stand: Modified independent (Device/Increase time) Stand pivot transfers: Modified independent (Device/Increase time) Squat pivot transfers: Modified independent (Device/Increase time)     General transfer comment: Patient was able to demonstrate safe transfer to and from bed/recliner with squat pivot transfer, sit to stand, and stand pivot transfer using a  rolling walker. She does not have her prosthesis. Maneuvers RW appropriately. No buckling noted. States she feels at her baseline.   Ambulation/Gait                Stairs            Wheelchair Mobility    Modified Rankin (Stroke Patients Only)       Balance Overall balance assessment: Needs assistance Sitting-balance support: No upper extremity supported Sitting balance-Leahy Scale: Normal     Standing balance support: Bilateral upper extremity supported Standing balance-Leahy Scale: Poor                               Pertinent Vitals/Pain Pain Assessment: No/denies pain    Home Living Family/patient expects to be discharged to:: Skilled nursing facility [facility]) Living Arrangements: Other (Comment) (SNF) Available Help at Discharge: Other (Comment);Available 24 hours/day (ALF) Type of Home: Assisted living Home Access: Level entry     Home Layout: One level Home Equipment: Grab bars - tub/shower;Wheelchair - Proofreader - 2 wheels (Prosthesis) Additional Comments: States she is in the ALF section of [facility]    Prior Function Level of Independence: Needs assistance   Gait / Transfers Assistance Needed: States she ambulates with prosthesis or uses w/c at times. about 50/50 with RW vs w/c. Denies any falls  ADL's / Homemaking Assistance Needed: States ALF prepares food.        Hand Dominance   Dominant Hand: Right    Extremity/Trunk Assessment               Lower  Extremity Assessment: LLE deficits/detail   LLE Deficits / Details: Hx of Lt BKA. Wears a prosthesis but does not have it here today.     Communication   Communication: No difficulties  Cognition Arousal/Alertness: Awake/alert Behavior During Therapy: WFL for tasks assessed/performed Overall Cognitive Status: Within Functional Limits for tasks assessed                      General Comments General comments (skin integrity,  edema, etc.): Pt reports she does not feel weak and is apparently at baseline with her ability to transfer. She does not have her prosthesis with her.    Exercises        Assessment/Plan    PT Assessment Patent does not need any further PT services  PT Diagnosis Difficulty walking (pt without prosthesis)   PT Problem List    PT Treatment Interventions     PT Goals (Current goals can be found in the Care Plan section) Acute Rehab PT Goals Patient Stated Goal: none stated PT Goal Formulation: All assessment and education complete, DC therapy    Frequency     Barriers to discharge        Co-evaluation               End of Session   Activity Tolerance: Patient tolerated treatment well Patient left: in bed;with call bell/phone within reach           Time: 1610-9604 PT Time Calculation (min) (ACUTE ONLY): 15 min   Charges:   PT Evaluation $PT Eval Low Complexity: 1 Procedure     PT G CodesBerton Mount 03/07/2015, 5:03 PM  Charlsie Merles, [place] [phone]

## 2015-03-07 NOTE — Progress Notes (Signed)
Hypoglycemic Event  CBG: 36  Treatment:  1/2 50% dextrose ,ice cream, o.j  Symptoms:  shaky  Follow-up CBG: Time:2235 CBG Result:141  Possible Reasons for Event:  cl. Liq diet  Comments/MD notified:CCS    Arbutus Leas A

## 2015-03-08 ENCOUNTER — Inpatient Hospital Stay (HOSPITAL_COMMUNITY): Payer: Medicare Other

## 2015-03-08 ENCOUNTER — Encounter: Payer: Self-pay | Admitting: Adult Health

## 2015-03-08 LAB — GLUCOSE, CAPILLARY
GLUCOSE-CAPILLARY: 110 mg/dL — AB (ref 65–99)
GLUCOSE-CAPILLARY: 112 mg/dL — AB (ref 65–99)
GLUCOSE-CAPILLARY: 187 mg/dL — AB (ref 65–99)
GLUCOSE-CAPILLARY: 110 mg/dL — AB (ref 65–99)
GLUCOSE-CAPILLARY: 98 mg/dL (ref 65–99)
Glucose-Capillary: 120 mg/dL — ABNORMAL HIGH (ref 65–99)
Glucose-Capillary: 87 mg/dL (ref 65–99)
Glucose-Capillary: 87 mg/dL (ref 65–99)

## 2015-03-08 MED ORDER — DEXTROSE 50 % IV SOLN
INTRAVENOUS | Status: AC
  Filled 2015-03-08: qty 50

## 2015-03-08 MED ORDER — GLUCOSE 40 % PO GEL
ORAL | Status: AC
  Filled 2015-03-08: qty 1

## 2015-03-08 MED ORDER — IOHEXOL 300 MG/ML  SOLN
150.0000 mL | Freq: Once | INTRAMUSCULAR | Status: AC | PRN
  Administered 2015-03-08: 59 mL via ORAL

## 2015-03-08 NOTE — Progress Notes (Signed)
  [facility] Progress Note   Subjective: no complaints, no abd pain or SOB. Had ice cream and liquids so far. For UGI series today  Filed Vitals:   03/07/15 1530 03/07/15 1825 03/07/15 2220 03/08/15 0520  BP: 106/61  95/54 97/60  Pulse: 125 130 131 117  Temp: 100.2 F (37.9 C) 101 F (38.3 C) 99.3 F (37.4 C) 98.2 F (36.8 C)  TempSrc: Oral Oral Oral   Resp: 18  16 16   Height:      Weight:      SpO2: 100%  97% 96%    Inpatient medications: . acetaminophen  1,000 mg Oral TID  . aspirin EC  81 mg Oral Daily  . heparin subcutaneous  5,000 Units Subcutaneous 3 times per day  . insulin aspart  0-5 Units Subcutaneous QHS  . insulin aspart  0-9 Units Subcutaneous TID WC  . insulin glargine  3 Units Subcutaneous BID  . lidocaine-prilocaine   Topical Q T,Th,Sa-HD  . lip balm  1 application Topical BID  . loratadine  10 mg Oral Daily  . multivitamin with minerals  1 tablet Oral Daily  . pantoprazole  40 mg Oral Daily  . pravastatin  20 mg Oral QPM  . senna  1 tablet Oral BID  . sodium chloride  3 mL Intravenous Q12H     sodium chloride, bisacodyl, diphenhydrAMINE, HYDROmorphone (DILAUDID) injection, lactated ringers, magic mouthwash, menthol-cetylpyridinium, metoCLOPramide (REGLAN) injection, metoprolol, oxyCODONE, phenol, polyethylene glycol, promethazine, sodium chloride  Exam: Alert , calm, no distress Sclera anicteric, throat clear No jvd Chest clear bilat no rales RRR no mrg Abd soft ntnd no mass or ascite GU indwelling foley cath Ext no LE or UE edema LUA AVF +bruit Neuro is alert, Ox 3  TTS [place] 3.5h F160 350/500 37kg 2//25 bath LUA AVF Heparin 2000 Hect 3 ug Hb 13.7 tfs 18% Alb 2.9 ipth 435 P 5-8   Assessment: 1 SP achalasia surgery (myotomy/ partial fundoplication)- POD #2 2 ESRD HD tts 3 Vol at dry wt 4 PAD L BKA  5 Hx CVA 6 Chron indwelling foley 7 DM 2 on insulin  Plan - HD Tuesday, UGI series today w  gastrograffin   Vinson Moselle MD [facility] pager [phone]    cell [phone] 03/08/2015, 12:04 PM    Recent Labs Lab 03/06/15 0530 03/07/15 0455  NA 139 138  K 4.6 5.5*  CL 100* 100*  CO2 26 25  GLUCOSE 107* 146*  BUN 37* 41*  CREATININE 3.31* 4.28*  CALCIUM 8.2* 8.0*   No results for input(s): AST, ALT, ALKPHOS, BILITOT, PROT, ALBUMIN in the last 168 hours.  Recent Labs Lab 03/06/15 0530 03/07/15 0455  WBC 5.2 9.2  HGB 11.2* 10.6*  HCT 35.7* 33.6*  MCV 85.0 85.9  PLT 190 159

## 2015-03-08 NOTE — Progress Notes (Signed)
Patient ID: Elizabeth [name], female   DOB: December 22, 1945, 70 y.o.   MRN: [id number]  General Surgery [facility]  POD#: 2  Subjective: Patient in bed, comfortable.  Tolerating soft diet.  Anticipating UGI series in radiology this morning.  Not performed yesterday (?) due to conflict with hemodialysis.  Objective: Vital signs in last 24 hours: Temp:  [98.2 F (36.8 C)-101 F (38.3 C)] 98.2 F (36.8 C) (01/15 0520) Pulse Rate:  [93-131] 117 (01/15 0520) Resp:  [15-18] 16 (01/15 0520) BP: (92-106)/(52-63) 97/60 mmHg (01/15 0520) SpO2:  [95 %-100 %] 96 % (01/15 0520) Weight:  [37.7 kg (83 lb 1.8 oz)-38.8 kg (85 lb 8.6 oz)] 37.7 kg (83 lb 1.8 oz) (01/14 1421) Last BM Date: 03/05/15  Intake/Output from previous day: 01/14 0701 - 01/15 0700 In: 481 [P.O.:478; I.V.:3] Out: 1178 [Urine:225] Intake/Output this shift: Total I/O In: 3 [I.V.:3] Out: -   Physical Exam: HEENT - sclerae clear, mucous membranes moist Neck - soft Abdomen - soft without distension; non-tender; dressings dry and intact  Lab Results:   Recent Labs  03/06/15 0530 03/07/15 0455  WBC 5.2 9.2  HGB 11.2* 10.6*  HCT 35.7* 33.6*  PLT 190 159   BMET  Recent Labs  03/06/15 0530 03/07/15 0455  NA 139 138  K 4.6 5.5*  CL 100* 100*  CO2 26 25  GLUCOSE 107* 146*  BUN 37* 41*  CREATININE 3.31* 4.28*  CALCIUM 8.2* 8.0*   PT/INR No results for input(s): LABPROT, INR in the last 72 hours. Comprehensive Metabolic Panel:    Component Value Date/Time   NA 138 03/07/2015 0455   NA 139 03/06/2015 0530   K 5.5* 03/07/2015 0455   K 4.6 03/06/2015 0530   CL 100* 03/07/2015 0455   CL 100* 03/06/2015 0530   CO2 25 03/07/2015 0455   CO2 26 03/06/2015 0530   BUN 41* 03/07/2015 0455   BUN 37* 03/06/2015 0530   CREATININE 4.28* 03/07/2015 0455   CREATININE 3.31* 03/06/2015 0530   GLUCOSE 146* 03/07/2015 0455   GLUCOSE 107* 03/06/2015 0530   CALCIUM 8.0* 03/07/2015 0455   CALCIUM 8.2*  03/06/2015 0530   AST 77* 08/13/2014 1218   AST 23 06/26/2014 0258   ALT 101* 08/13/2014 1218   ALT 25 06/26/2014 0258   ALKPHOS 171* 08/13/2014 1218   ALKPHOS 105 06/26/2014 0258   BILITOT 0.9 08/13/2014 1218   BILITOT 0.3 06/26/2014 0258   PROT 7.8 08/13/2014 1218   PROT 5.9* 06/26/2014 0258   ALBUMIN 3.4* 08/13/2014 1218   ALBUMIN 2.2* 06/28/2014 8453    Studies/Results: No results found.  Anti-infectives: Anti-infectives    Start     Dose/Rate Route Frequency Ordered Stop   03/06/15 0514  cefTRIAXone (ROCEPHIN) 2 g in dextrose 5 % 50 mL IVPB     2 g 100 mL/hr over 30 Minutes Intravenous On call to O.R. 03/06/15 6468 03/06/15 0810   03/06/15 0514  metroNIDAZOLE (FLAGYL) IVPB 500 mg     500 mg 100 mL/hr over 60 Minutes Intravenous On call to O.R. 03/06/15 0514 03/06/15 0755      Assessment & Plans: Status post Heller myotomy and fundoplication Gastrograffin UGI series this AM Already on diet ESRD HD yesterday  Per renal team  Velora Heckler, MD, [facility] Office: [phone]   Ardie Mclennan Judie Petit 03/08/2015

## 2015-03-08 NOTE — Progress Notes (Signed)
Patient ID: Elizabeth [name], female   DOB: January 05, 1946, 70 y.o.   MRN: [id number]   Facility: [facility]      No Known Allergies  Chief Complaint  Patient presents with  . Acute Visit    wound management     HPI:  She has a chronic sacral ulceration which is being followed by the wound clinic. I have been asked to see her for a review of her wound. Her wound is slowly resolving. There are no signs of infection present. She is not complaining of pain.   Past Medical History  Diagnosis Date  . Coronary artery disease, non-occlusive January 2016    Moderate p&mLAD & Ostial OM; Myoview Nuclear Stress Test 03/21/2014: EF 35%. Mild hypokinesis of the inferior wall with fixed inferior defect suggesting scar. (no culprit lesion on CATH)  . 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 Critical lower limb ischemia     Status post left BKA following left popliteal and peroneal artery endarterectomy with patch angioplasty.  . Diabetic neuropathy (HCC)   . Hyperlipidemia with target LDL less than 70   . History of CVA (cerebrovascular accident) 08/19/2013    "minor one"  . ESRD (end stage renal disease) (HCC)     [address], [place], [place] (06/12/2014)  . Urinary retention     DX NEUROGENIC BLADDER--HAS INDWELLING FOLEY CATHETER  . Foley catheter in place     for urinary retention  . Anemia   . Hematemesis April 2016    Hospitalized  . Arthritis of knee, right     "right knee" (06/12/2014)  . Gout     "was in my LLE"  . Diabetes mellitus, insulin dependent (IDDM), uncontrolled (HCC)     Brittle; has h/o Hypo & Hyperglycemic episodes, Type II  . Foot infection   . Loss of consciousness 08/19/2013  . UTI (urinary tract infection) 10/17/2013  . Acute hyperkalemia 12/24/2013    Past Surgical History  Procedure  Laterality Date  . Insertion of suprapubic catheter N/A 05/16/2012    Procedure: INSERTION OF SUPRAPUBIC CATHETER;  Surgeon: Sebastian Ache, MD;  Location: [facility];  Service: Urology;  Laterality: N/A;  . Bascilic vein transposition Left 10/30/2012    Procedure: LEFT 1ST STAGE BASCILIC VEIN TRANSPOSITION;  Surgeon: Chuck Hint, MD;  Location: [facility] OR;  Service: Vascular;  Laterality: Left;  . Nm myocar perf wall motion  04/19/2012/ 02/2014    a) low risk study-EF 44%,mild inferolateral hypkinesis; b) EF 35%. Mild hypokinesis of the inferior wall with fixed inferior defect suggesting scar.  . Bascilic vein transposition Left 02/05/2013    Procedure: BASCILIC VEIN TRANSPOSITION 2ND STAGE;  Surgeon: Chuck Hint, MD;  Location: [facility] OR;  Service: Vascular;  Laterality: Left;  . Radiology with anesthesia N/A 10/25/2013    Procedure: RADIOLOGY WITH ANESTHESIA;  Surgeon: Medication Radiologist, MD;  Location: [facility] OR;  Service: Radiology;  Laterality: N/A;  . Fistulogram Left 04/29/2013    Procedure: FISTULOGRAM;  Surgeon: Chuck Hint, MD;  Location: [facility] CATH LAB;  Service: Cardiovascular;  Laterality: Left;  . Angioplasty Left 04/29/2013    Procedure: ANGIOPLASTY;  Surgeon: Chuck Hint, MD;  Location: [facility] CATH LAB;  Service: Cardiovascular;  Laterality: Left;  . Abdominal aortagram N/A 03/19/2014    Procedure: ABDOMINAL Ronny Flurry;  Surgeon: Sherren Kerns,  MD;  Location: [place] CATH LAB;  Service: Cardiovascular;  Laterality: N/A;  . Left and right heart catheterization with coronary angiogram N/A 03/24/2014    Procedure: LEFT AND RIGHT HEART CATHETERIZATION WITH CORONARY ANGIOGRAM;  Surgeon: Leonie Man, MD;  Location: [facility] CATH LAB;  Service: Cardiovascular; Moderate disease in p-mLAD & Ostial OM1; normal right heart pressures. Wedge pressure 29 mmHg. --No lesion to explain inferior defect on Myoview and echocardiogram.   . Endarterectomy popliteal Left 03/26/2014    Procedure: Left  popliteal and peroneal artery endarterectomy with vein patch angioplasty;  Surgeon: Elam Dutch, MD;  Location: [place];  Service: Vascular;  Laterality: Left;  . Amputation Left 03/26/2014    Procedure: AMPUTATION SECOND LEFT TOE;  Surgeon: Elam Dutch, MD;  Location: [place];  Service: Vascular;  Laterality: Left;  . Av fistula placement Left 04/28/2014    Procedure: CREATION OF LEFT BRACHIOCEPHALIC  ARTERIOVENOUS (AV) FISTULA CREATION;  Surgeon: Conrad Ava, MD;  Location: [place];  Service: Vascular;  Laterality: Left;  . Amputation Left 05/10/2014    Procedure: AMPUTATION BELOW KNEE;  Surgeon: Rosetta Posner, MD;  Location: [facility] OR;  Service: Vascular;  Laterality: Left;  . Esophagogastroduodenoscopy N/A 06/13/2014    Procedure: ESOPHAGOGASTRODUODENOSCOPY (EGD);  Surgeon: Carol Ada, MD;  Location: [facility] ENDOSCOPY;  Service: Endoscopy;  Laterality: N/A;  . Transthoracic echocardiogram  03/22/2014    EF 30-35%, mild LVH. Severe inferior hypokinesis. Mild MR.  Marland Kitchen Below knee leg amputation Left   . Fistulogram Left 10/08/2014    Procedure: LEFT ARM FISTULOGRAM;  Surgeon: Conrad Bella Villa, MD;  Location: [place];  Service: Vascular;  Laterality: Left;  . Venoplasty Left 10/08/2014    Procedure: LEFT CEPHALIC VEIN VENOPLASTY;  Surgeon: Conrad Judsonia, MD;  Location: [place];  Service: Vascular;  Laterality: Left;  . Incision and drainage of wound N/A 10/29/2014    Procedure: IRRIGATION AND DEBRIDEMENT OF SACRAL WOUND, PLACEMENT OF A-CELL;  Surgeon: Theodoro Kos, DO;  Location: [place];  Service: Plastics;  Laterality: N/A;  . Application of a-cell of extremity N/A 10/29/2014    Procedure: PLACEMENT OF APPLICATION OF A-CELL;  Surgeon: Theodoro Kos, DO;  Location: [place];  Service: Plastics;  Laterality: N/A;  . Esophageal manometry N/A 11/24/2014    Procedure: ESOPHAGEAL MANOMETRY (EM);  Surgeon: Carol Ada, MD;  Location: [facility] ENDOSCOPY;  Service: Endoscopy;  Laterality: N/A;    VITAL SIGNS BP 116/67 mmHg  Pulse  100  Ht '5\' 3"'$  (1.6 m)  Wt 83 lb (37.649 kg)  BMI 14.71 kg/m2  Patient's Medications  New Prescriptions   No medications on file  Previous Medications  ASPIRIN EC 81 MG TABLET    Take 81 mg by mouth daily.  CETIRIZINE (ZYRTEC) 10 MG TABLET    Take 10 mg by mouth daily as needed for allergies.   INSULIN ASPART (NOVOLOG) 100 UNIT/ML INJECTION 3 units with breakfast 5 units with lunch 8 units with supper   INSULIN GLARGINE (LANTUS) 100 UNIT/ML INJECTION    Inject 0.03 mLs (3 Units total) into the skin daily.  LIDOCAINE-PRILOCAINE (EMLA) CREAM    Apply 1 application topically Every Tuesday,Thursday,and Saturday with dialysis.   MULTIPLE VITAMINS-MINERALS (DECUBI-VITE) CAPS    Take 2 capsules by mouth daily.  OXYCODONE-ACETAMINOPHEN (PERCOCET/ROXICET) 5-325 MG PER TABLET    Take one tablet by mouth every 8 hours as needed for pain. Not to exceed 3gm APAP from all sources/24 hours  PANTOPRAZOLE (PROTONIX) 40 MG TABLET  Take 1 tablet (40 mg total) by mouth daily.  PRAVASTATIN (PRAVACHOL) 20 MG TABLET    Take 20 mg by mouth every evening.   PROMETHAZINE (PHENERGAN) 25 MG TABLET    Take 25 mg by mouth every 6 (six) hours as needed for nausea or vomiting.  SENNA (SENOKOT) 8.6 MG TABS TABLET    Take 1 tablet by mouth 2 (two) times daily.  SEVELAMER CARBONATE (RENVELA) 800 MG TABLET    Take 1 tablet (800 mg total) by mouth 3 (three) times daily with meals.    Modified Medications   No medications on file  Discontinued Medications     SIGNIFICANT DIAGNOSTIC EXAMS   05-04-14: chest x-ray: mild chf with bilateral air space disease and small bilateral pleural effusion   05-09-14: chest x-ray: 1. Stable mild cardiomegaly. 2. Decreased pleural effusions and improved aeration.  05-09-14: left foot x-ray: Acute osteomyelitis involving in the residual 2nd metatarsal, 3rd metatarsal head, and 3rd proximal phalanx.Associated pathologic fracture of the 3rd proximal phalanx.     LABS REVIEWED:    04-01-14: wbc 12.1; hgb 9.6; hct 34.7; mcv 92.4; plt 434 05-02-14: urine culture: neg  05-09-14: wbc 8.5; hgb 11.;4 hct 37.1; mcv 87.1 plt 252; glucose 299; bun 43; creat 3.49; k+ 3.7; na++135; liver normal albumin 2.1; hgb a1c 7.0; tsh 1.242 05-11-14: wbc 9.7; hgb 10.8; hct 37.1; mcv 90.3; plt 213; glucose 114; bun 24; creat 3.12; k+4.5; na++136  05-12-14: wbc 11.9; hgb 10.4; hct 33.8; mcv 87.3 plt 225; glucose 151; bun 52; creat 4.52; k+5.0; na++135  06-28-14: wbc 5.9; hgb 11.8; hct 37.7; mcv 84.2; plt 103; glucose 86; bun 52; creat 3.74; k+5.5; na++134; albumin 2.2; phos 5.4  08-13-14: wbc 5.7 hgb 12.5 ;hct 40.5; mcv 84.6; plt 153; glucose 126; bun 80; creat 5.22; k+ 5.3; na++134; alk phos 145; ast 60; alt 83; albumin 3.0; hgb a1c 7.7; chol 146; ;ldl 44; trig 74; hdl 40 11-24-14: hgb a1c 7.0       Review of Systems Constitutional: Negative for appetite change and fatigue.  HENT: Negative for congestion.   Respiratory: Negative for cough, chest tightness and shortness of breath.   Cardiovascular: Negative for chest pain, palpitations and leg swelling.  Gastrointestinal: negative for nausea, vomiting, diarrhea   Musculoskeletal: Negative for myalgias and arthralgias.  Skin: Negative for pallor.  Neurological: Negative for dizziness.  Psychiatric/Behavioral: The patient is not nervous/anxious.    Physical Exam Constitutional: No distress.  Frail   Eyes: Conjunctivae are normal.  Neck: Neck supple. No JVD present. No thyromegaly present.  Cardiovascular: Normal rate, regular rhythm and intact distal pulses.   Respiratory: Effort normal and breath sounds normal. No respiratory distress. She has no wheezes.  GI: Soft. Bowel sounds are normal. She exhibits no distension. There is no tenderness.    Musculoskeletal: She exhibits no edema.  Able to move all extremities  Left bka   Lymphadenopathy:    She has no cervical adenopathy.  Neurological: She is alert.  Skin: Skin is warm and dry.  She is not diaphoretic.  Psychiatric: She has a normal mood and affect.  Sacral wound 0.4 x  0.5 cm being followed by wound clinic Has left upper extremity A/V fistula      ASSESSMENT/ PLAN:  1. Stage IV sacral wound: will continue to be followed by Dr. Migdalia Dk at the wound clinic. Will monitor her status.    Ok Edwards NP [facility]  Contact [phone] Monday through Friday 8am- 5pm  After  hours call [phone]

## 2015-03-09 ENCOUNTER — Inpatient Hospital Stay (HOSPITAL_COMMUNITY): Payer: Medicare Other

## 2015-03-09 DIAGNOSIS — L8990 Pressure ulcer of unspecified site, unspecified stage: Secondary | ICD-10-CM | POA: Insufficient documentation

## 2015-03-09 LAB — URINALYSIS, ROUTINE W REFLEX MICROSCOPIC
BILIRUBIN URINE: NEGATIVE
GLUCOSE, UA: NEGATIVE mg/dL
KETONES UR: 15 mg/dL — AB
Nitrite: NEGATIVE
PH: 7 (ref 5.0–8.0)
PROTEIN: 100 mg/dL — AB
Specific Gravity, Urine: 1.016 (ref 1.005–1.030)

## 2015-03-09 LAB — GLUCOSE, CAPILLARY
GLUCOSE-CAPILLARY: 130 mg/dL — AB (ref 65–99)
GLUCOSE-CAPILLARY: 116 mg/dL — AB (ref 65–99)
Glucose-Capillary: 62 mg/dL — ABNORMAL LOW (ref 65–99)
Glucose-Capillary: 98 mg/dL (ref 65–99)
Glucose-Capillary: 83 mg/dL (ref 65–99)
Glucose-Capillary: 83 mg/dL (ref 65–99)
Glucose-Capillary: 116 mg/dL — ABNORMAL HIGH (ref 65–99)

## 2015-03-09 LAB — URINE MICROSCOPIC-ADD ON

## 2015-03-09 LAB — HEMOGLOBIN A1C
HEMOGLOBIN A1C: 7.6 % — AB (ref 4.8–5.6)
MEAN PLASMA GLUCOSE: 171 mg/dL

## 2015-03-09 MED ORDER — LOPERAMIDE HCL 2 MG PO CAPS
2.0000 mg | ORAL_CAPSULE | Freq: Once | ORAL | Status: AC
  Administered 2015-03-09: 2 mg via ORAL
  Filled 2015-03-09: qty 1

## 2015-03-09 MED ORDER — CEFAZOLIN SODIUM 1-5 GM-% IV SOLN
1.0000 g | INTRAVENOUS | Status: DC
Start: 2015-03-10 — End: 2015-03-13
  Administered 2015-03-10 – 2015-03-12 (×2): 1 g via INTRAVENOUS
  Filled 2015-03-09 (×4): qty 50

## 2015-03-09 MED ORDER — DEXTROSE 50 % IV SOLN
INTRAVENOUS | Status: AC
  Administered 2015-03-09: 50 mL
  Filled 2015-03-09: qty 50

## 2015-03-09 MED ORDER — PRO-STAT SUGAR FREE PO LIQD
30.0000 mL | Freq: Two times a day (BID) | ORAL | Status: DC
  Administered 2015-03-09: 30 mL via ORAL
  Filled 2015-03-09 (×2): qty 30

## 2015-03-09 MED ORDER — ACETAMINOPHEN 650 MG RE SUPP: 650.0000 mg | Freq: Four times a day (QID) | RECTAL | Status: DC | PRN

## 2015-03-09 MED ORDER — BOOST / RESOURCE BREEZE PO LIQD
1.0000 | Freq: Three times a day (TID) | ORAL | Status: DC
  Administered 2015-03-09 – 2015-03-10 (×4): 1 via ORAL

## 2015-03-09 MED ORDER — LOPERAMIDE HCL 2 MG PO CAPS
2.0000 mg | ORAL_CAPSULE | Freq: Two times a day (BID) | ORAL | Status: DC | PRN
  Administered 2015-03-11: 2 mg via ORAL
  Filled 2015-03-09 (×2): qty 1

## 2015-03-09 MED ORDER — ACETAMINOPHEN 325 MG PO TABS
325.0000 mg | ORAL_TABLET | Freq: Four times a day (QID) | ORAL | Status: DC | PRN
  Administered 2015-03-09: 650 mg via ORAL
  Filled 2015-03-09: qty 2

## 2015-03-09 MED ORDER — POLYETHYLENE GLYCOL 3350 17 G PO PACK
17.0000 g | PACK | Freq: Every day | ORAL | Status: DC
  Filled 2015-03-09: qty 1

## 2015-03-09 NOTE — Consult Note (Addendum)
WOC wound consult note Reason for Consult: Consult requested for sacrum wound.  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  Pt is very emaciated and sacral bone protrudes. She states she is followed by the outpatient wound care center prior to admission and has been using collagen dressings.  This topical treatment is not available in the [facility] formulary. Pressure Ulcer POA: Yes Measurement: 1X1X.5cm, bone visible Wound bed: Pale red wound bed Drainage (amount, consistency, odor) No odor, small amt yellow drainage  Periwound: Intact skin surrounding Dressing procedure/placement/frequency: Substitute Aquacel for collagen; pt can resume previous plan of care after discharge and follow-up with the outpatient wound care center.  Air mattress to reduce pressure, nutrition consult to optimize protein intake. Please re-consult if further assistance is needed.  Thank-you,  Cammie Mcgee MSN, RN, CWOCN, [place], CNS [phone]

## 2015-03-09 NOTE — Progress Notes (Signed)
Hypoglycemic Event  CBG: 62  Treatment: 1/2 50% dextrose   Symptoms: Shaky  Follow-up CBG: Time: 0715 CBG Result: 98  Possible Reasons for Event: Inadequate meal intake  Comments/MD notified: notes written

## 2015-03-09 NOTE — Progress Notes (Addendum)
Subjective:  Alert ," that broth went down okay too greasy   And vomited / ice cream for lunch is better"HD for tomor.  tmax 100.8 last night   Objective Vital signs in last 24 hours: Filed Vitals:   03/08/15 0520 03/08/15 1304 03/08/15 2111 03/09/15 0445  BP: 97/60 97/64 87/47  94/54  Pulse: 117 115 112 96  Temp: 98.2 F (36.8 C) 99.3 F (37.4 C) 100.8 F (38.2 C) 98.7 F (37.1 C)  TempSrc:  Oral Oral   Resp: 16 17 16 16   Height:      Weight:      SpO2: 96% 96% 95% 96%   Weight change:   Physical Exam: General: alert NAD , sl withdrawn  But about baseline  Heart: RRR no m,r g Lungs: CTA  bilat  Abdomen: BS pos, soft , nt, nd Extremities: no pedal edema Dialysis Access: Pos. Bruit LUA AVF    OP HD=TTS [place] [facility] 3.5h F160 350/500 37kg 2//25 bath LUA AVF Heparin 2000 Hect 3 ug Hb 13.7 tfs 18% Alb 2.9 ipth 435 P 5-8  Problem/Plan: 1. SP achalasia surgery (myotomy/ partial fundoplication)- Per Surg advancing diet  As tolerates 2 ESRD HD tts on schedule  3 Vol/ HTN= at edw  , no excess vol  bp stable no meds 4. MBD  On renvela , hect  check labs pre hd  5. Anemia= hgb  10.6 , no esa monitor hgb   6 PAD L BKA - has been up in chair 7 Hx CVA= at snh 8 Chron indwelling foley 9  DM 2 on insulin   Lenny Pastel, PA-C [facility] Beeper [phone] 03/09/2015,8:56 AM  LOS: 3 days    Patient seen and examined, agree with above note with above modifications. No c/o's today - just low grade temp- plan for regular  HD treatment tomorrow  Annie Sable, MD 03/09/2015     Labs: Basic Metabolic Panel:  Recent Labs Lab 03/06/15 0530 03/07/15 0455  NA 139 138  K 4.6 5.5*  CL 100* 100*  CO2 26 25  GLUCOSE 107* 146*  BUN 37* 41*  CREATININE 3.31* 4.28*  CALCIUM 8.2* 8.0*  CBC:  Recent Labs Lab 03/06/15 0530 03/07/15 0455  WBC 5.2 9.2  HGB 11.2* 10.6*  HCT 35.7* 33.6*  MCV 85.0 85.9  PLT 190 159   Cardiac Enzymes: No results for  input(s): CKTOTAL, CKMB, CKMBINDEX, TROPONINI in the last 168 hours. CBG:  Recent Labs Lab 03/08/15 2124 03/08/15 2152 03/09/15 0644 03/09/15 0717 03/09/15 0802  GLUCAP 87 87 62* 98 83    Studies/Results: Dg Esophagus W/water Sol Cm  03/08/2015  CLINICAL DATA:  Patient status post Heller myotomy and fundoplication. History of achalasia. EXAM: ESOPHOGRAM/BARIUM SWALLOW TECHNIQUE: Single contrast examination was performed using  water-soluble. FLUOROSCOPY TIME:  Radiation Exposure Index (as provided by the fluoroscopic device): If the device does not provide the exposure index: Fluoroscopy Time:  1 minutes 50 seconds Number of Acquired Images:  8 COMPARISON:  Upper GI 06/16/2014 FINDINGS: The patient was examined in a semi-upright supine position. Patient swallowed a small moderate volume of oral contrast (59 mL). The contrast flowed through the esophagus into the stomach. There is narrowed stricturing at the GE junction with only a thin stream ofcontrast passing into stomach. There is significant retention of the oral contrast within the esophagus which is patulous. There is indentation along the lower third of the esophagus over a 9 cm segment. The these findings are similar to the  pre surgical esophagram. No evidence of leak. IMPRESSION: 1. Narrow but patent gastroesophageal junction. Contrast does flows into the stomach but a large portion of the contrast remains within the patulous esophagus. Recommend caution in advancing diet. 2. No esophageal leak. Findings conveyed toFloor nurseon 03/08/2015 at13:16. ON call surgeon paged. Electronically Signed   By: Genevive Bi M.D.   On: 03/08/2015 13:17   Medications:   . aspirin EC  81 mg Oral Daily  . heparin subcutaneous  5,000 Units Subcutaneous 3 times per day  . insulin aspart  0-5 Units Subcutaneous QHS  . insulin aspart  0-9 Units Subcutaneous TID WC  . insulin glargine  3 Units Subcutaneous BID  . lidocaine-prilocaine   Topical Q  T,Th,Sa-HD  . lip balm  1 application Topical BID  . loperamide  2 mg Oral Once  . loratadine  10 mg Oral Daily  . multivitamin with minerals  1 tablet Oral Daily  . pantoprazole  40 mg Oral Daily  . polyethylene glycol  17 g Oral Daily  . pravastatin  20 mg Oral QPM  . senna  1 tablet Oral BID  . sodium chloride  3 mL Intravenous Q12H    called from [facility] end of day concerning need  to continue Ancef 1gm qhd through Jan 26 for prior Streptococcus agalactiae Blood cultures from isolated temp post hd jan 5  /I put hard copy in chart  Will continue  And reck BC.

## 2015-03-09 NOTE — Evaluation (Signed)
Occupational Therapy Evaluation Patient Details Name: Elizabeth [name] MRN: [id number] DOB: 04-11-1945 Today's Date: 03/09/2015    History of Present Illness 70 y.o. female with hx of left BKA, s/p XI ROBOTIC HELLER ESOPHAGOCARDIOMYOTOMY AND PARTIAL TOUPET FUNDOPLICATION. PMH of CVA, chronic renal failure syndrome, and DM.   Clinical Impression   Pt currently at baseline level with ADLs and functional mobility. Pt reports she plans to return to [facility]); agree with discharge disposition. Pt with no further acute OT needs; signing off at this time. Thank you for this referral.     Follow Up Recommendations  SNF;Supervision/Assistance - 24 hour ([facility])    Equipment Recommendations  None recommended by OT    Recommendations for Other Services       Precautions / Restrictions Precautions Precautions: None Restrictions Weight Bearing Restrictions: No      Mobility Bed Mobility Overal bed mobility: Independent                Transfers Overall transfer level: Needs assistance Equipment used: 1 person hand held assist Transfers: Sit to/from [employer] Sit to Stand: Min assist Stand pivot transfers: Min assist       General transfer comment: Pt reported that she preferred to use hand held assist; pt overall min assist for sit to stand and stand pivot transfer from EOB to chair.    Balance Overall balance assessment: Needs assistance Sitting-balance support: Feet supported;No upper extremity supported Sitting balance-Leahy Scale: Normal     Standing balance support: Bilateral upper extremity supported Standing balance-Leahy Scale: Poor                              ADL Overall ADL's : Needs assistance/impaired;At baseline                                       General ADL Comments: Pt currenlty at baseline level for ADLs and functional mobility. Pt requires assist with bathing, tub transfers. Pt able to  dress self with set up and assist to stand without prosthesis.     Vision     Perception     Praxis      Pertinent Vitals/Pain Pain Assessment: No/denies pain     Hand Dominance Right   Extremity/Trunk Assessment Upper Extremity Assessment Upper Extremity Assessment: Overall WFL for tasks assessed   Lower Extremity Assessment Lower Extremity Assessment: Defer to PT evaluation       Communication Communication Communication: No difficulties   Cognition Arousal/Alertness: Awake/alert Behavior During Therapy: WFL for tasks assessed/performed Overall Cognitive Status: Within Functional Limits for tasks assessed                     General Comments       Exercises       Shoulder Instructions      Home Living Family/patient expects to be discharged to:: Skilled nursing facility [facility])     Type of Home: Assisted living Home Access: Level entry     Home Layout: One level     Bathroom Shower/Tub: Tub/shower unit Shower/tub characteristics: Engineer, building services: Standard Bathroom Accessibility: Yes How Accessible: Accessible via walker Home Equipment: Grab bars - tub/shower;Wheelchair - manual;Hospital bed;Walker - 2 wheels (prosthesis)   Additional Comments: Per PT eval: States she is in the ALF section of [facility]      Prior Functioning/Environment Level of Independence: Needs assistance  Gait / Transfers Assistance Needed: States she ambulates with prosthesis or uses w/c at times. about 50/50 with RW vs w/c. Denies any falls ADL's / Homemaking Assistance Needed: Needs assistance with transfers when she does not have her prosthesis. Does not perform toilet transfers: wears depends for BM        OT Diagnosis: Generalized weakness   OT Problem List:     OT Treatment/Interventions:      OT Goals(Current goals can be found in the care plan section) Acute Rehab OT Goals Patient Stated Goal: none stated OT Goal  Formulation: All assessment and education complete, DC therapy  OT Frequency:     Barriers to D/C:            Co-evaluation              End of Session Equipment Utilized During Treatment: Gait belt  Activity Tolerance: Patient tolerated treatment well Patient left: in chair;with call bell/phone within reach   Time: 1139-1150 OT Time Calculation (min): 11 min Charges:  OT General Charges $OT Visit: 1 Procedure OT Evaluation $OT Eval Low Complexity: 1 Procedure G-Codes:     Gaye Alken M.S., OTR/L Pager: [phone]  03/09/2015, 12:00 PM

## 2015-03-09 NOTE — Care Management Important Message (Signed)
Important Message  Patient Details  Name: Elizabeth [name] MRN: [id number] Date of Birth: 1945-07-03   Medicare Important Message Given:  Yes    Oralia Rud Adalei Novell 03/09/2015, 4:23 PM

## 2015-03-09 NOTE — Progress Notes (Signed)
Initial Nutrition Assessment  DOCUMENTATION CODES:   Underweight, Severe malnutrition in context of chronic illness  INTERVENTION:   -Boost Breeze po TID, each supplement provides 250 kcal and 9 grams of protein -30 ml Prostat BID. Each supplement provides 100 kcals and 15 grams protein -48 hour Calorie Count initiated by MD; RD will follow-up with results on 03/10/15  NUTRITION DIAGNOSIS:   Malnutrition related to chronic illness as evidenced by severe depletion of body fat, severe depletion of muscle mass.  GOAL:   Patient will meet greater than or equal to 90% of their needs  MONITOR:   PO intake, Supplement acceptance, Diet advancement, Labs, Weight trends, Skin, I & O's  REASON FOR ASSESSMENT:   Consult Calorie Count (wound healing)  ASSESSMENT:   The patient is a 70 year old female. Patient sent for surgical consultation by Dr. Jeani Hawking for concern of worsening achalasia.  S/p Procedure(s) 03/06/15: XI ROBOTIC HELLER CARDIOMYOTOMY AND PARTIAL TOUPET FUNDOPLICATION EGD (N/A)   Pt sitting on recliner at time of visit. She reports fair tolerance of clear liquid diet; complains that the broth made her nauseous. She has poor appetite PTA due to difficulty swallowing, but pt notes this has been much improved since procedure. She was consuming 3 meals per day PTA, with ground meats due to swallowing difficulty. Pt also shares that she recently started receiving Nepro supplements, which she likes.   Pt estimates that she has lost 3-5 pounds, however, wt hx reveals wt stability over the past year.   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 She confirms she is followed by outpatient wound center. COWRN ordered air mattress to help assist with healing.   Discussed importance of good meal and supplement intake to support healing. She is amenable to [employer] supplement until diet is advanced.   Nutrition-Focused physical exam  completed. Findings are severe fat depletion, severe muscle depletion, and no edema.   Labs reviewed: CBGS: 62-98.   Diet Order:  Diet - low sodium heart healthy Diet clear liquid Room service appropriate?: Yes; Fluid consistency:: Thin; Fluid restriction:: 2000 mL Fluid  Skin:  Wound (see comment) (stage IV sacrum, closed abdominal incision)  Last BM:  03/08/15  Height:   Ht Readings from Last 1 Encounters:  03/07/15 5\' 3"  (1.6 m)    Weight:   Wt Readings from Last 1 Encounters:  03/07/15 83 lb 1.8 oz (37.7 kg)    Ideal Body Weight:  48.9 kg  BMI:  Body mass index is 14.73 kg/(m^2).  Estimated Nutritional Needs:   Kcal:  1300-1500  Protein:  55-70 grams  Fluid:  1.0-1.5 L  EDUCATION NEEDS:   Education needs addressed  Arieon Corcoran A. Mayford Knife, RD, LDN, CDE Pager: [phone] After hours Pager: [phone]

## 2015-03-09 NOTE — Progress Notes (Signed)
[facility]  [address]  [place], [place] [address] Phone: [phone] FAX: [phone]   Elizabeth [name] [id number] August 07, 1945   Assessment  Dysphagia - prob tight wrap with retained food  Problem List:   Principal Problem:   Achalasia, esophageal Active Problems:   HLD (hyperlipidemia)   Anemia of chronic disease   ESRD on hemodialysis (HCC)   Protein-calorie malnutrition, severe (HCC)   Neuropathy (HCC)   Cardiomyopathy, ischemic   Moderate 3V CAD at cath 03/24/14   Peripheral artery disease (HCC)   S/P BKA (below knee amputation) unilateral (HCC)   Chronic systolic heart failure (HCC)   DM (diabetes mellitus) type II controlled with renal manifestation (HCC)   Achalasia of esophagus   Pressure ulcer   3 Days Post-Op  03/06/2015  Procedure(s): XI ROBOTIC HELLER ESOPHAGOCARDIOMYOTOMY AND PARTIAL TOUPET FUNDOPLICATION  UPPER GI ENDOSCOPY    Plan:  -liquids today -may adv to fulls tomorrow -cal counts -ESRD - HD per nephrology -skin care -bowel regimen -stays in bed mostly - baseline per PT -VTE prophylaxis- SCDs, etc -mobilize as tolerated to help recovery -Dispo - [facility] when better  D/C patient from hospital when patient meets criteria (anticipate in 1-2 day(s)):  Tolerating oral intake well (pureed) Transferring well Adequate pain control without IV medications Urinating  Having flatus Disposition planning in place   Ardeth Sportsman, M.D., F.A.C.S. Gastrointestinal and Minimally Invasive Surgery [facility] [address] [place], [place] [address] [phone] Main / Paging   03/09/2015  Subjective:  Mild soreness with deep breaths Spit up broth "maybe it's too greasy"  Objective:  Vital signs:  Filed Vitals:   03/08/15 0520 03/08/15 1304 03/08/15 2111 03/09/15 0445  BP: 94/54  Pulse: 117 115 112 96  Temp: 98.2 F (36.8  C) 99.3 F (37.4 C) 100.8 F (38.2 C) 98.7 F (37.1 C)  TempSrc:  Oral Oral   Resp: Height:      Weight:      SpO2: 96% 96% 95% 96%    Last BM Date: 03/08/15  Intake/Output   Yesterday:  01/15 0701 - 01/16 0700 In: 1039 [P.O.:1036; I.V.:3] Out: 100 [Urine:100] This shift:     Bowel function:  Flatus: y  BM: loose  Drain: n/a  Physical Exam:  General: Pt awake/alert/oriented x4 in no acute distress Eyes: PERRL, normal EOM.  Sclera clear.  No icterus Neuro: CN II-XII intact w/o focal sensory/motor deficits. Lymph: No head/neck/groin lymphadenopathy Psych:  No delerium/psychosis/paranoia HENT: Normocephalic, Mucus membranes moist.  No thrush Neck: Supple, No tracheal deviation Chest: No chest wall pain w good excursion CV:  Pulses intact.  Regular rhythm MS: Normal AROM mjr joints.  No obvious deformity Abdomen: Soft.  Nondistended.  Mildly tender at incisions only.  No evidence of peritonitis.  No incarcerated hernias. Ext:  SCDs BLE.  L AKA stable.  No mjr edema.  No cyanosis Skin: No petechiae / purpura  Results:   Labs: Results for orders placed or performed during the hospital encounter of 03/06/15 (from the past 48 hour(s))  Glucose, capillary     Status: Abnormal   Collection Time: 03/07/15  7:45 AM  Result Value Ref Range   Glucose-Capillary 123 (H) 65 - 99 mg/dL  Glucose, capillary     Status: Abnormal   Collection Time: 03/07/15  4:53 PM  Result Value Ref Range   Glucose-Capillary 59 (L) 65 -  99 mg/dL  Glucose, capillary     Status: None   Collection Time: 03/07/15  5:25 PM  Result Value Ref Range   Glucose-Capillary 84 65 - 99 mg/dL  Glucose, capillary     Status: Abnormal   Collection Time: 03/07/15 10:19 PM  Result Value Ref Range   Glucose-Capillary 36 (LL) 65 - 99 mg/dL   Comment 1 Notify RN    Comment 2 Document in Chart   Glucose, capillary     Status: Abnormal   Collection Time: 03/07/15 10:34 PM  Result Value Ref Range    Glucose-Capillary 141 (H) 65 - 99 mg/dL  Glucose, capillary     Status: Abnormal   Collection Time: 03/07/15 11:13 PM  Result Value Ref Range   Glucose-Capillary 148 (H) 65 - 99 mg/dL   Comment 1 Notify RN    Comment 2 Document in Chart   Glucose, capillary     Status: Abnormal   Collection Time: 03/08/15 12:20 AM  Result Value Ref Range   Glucose-Capillary 120 (H) 65 - 99 mg/dL  Glucose, capillary     Status: Abnormal   Collection Time: 03/08/15  3:43 AM  Result Value Ref Range   Glucose-Capillary 110 (H) 65 - 99 mg/dL  Glucose, capillary     Status: Abnormal   Collection Time: 03/08/15  7:44 AM  Result Value Ref Range   Glucose-Capillary 112 (H) 65 - 99 mg/dL  Glucose, capillary     Status: Abnormal   Collection Time: 03/08/15 11:59 AM  Result Value Ref Range   Glucose-Capillary 187 (H) 65 - 99 mg/dL  Glucose, capillary     Status: Abnormal   Collection Time: 03/08/15  5:24 PM  Result Value Ref Range   Glucose-Capillary 110 (H) 65 - 99 mg/dL  Glucose, capillary     Status: None   Collection Time: 03/08/15  7:59 PM  Result Value Ref Range   Glucose-Capillary 98 65 - 99 mg/dL   Comment 1 Notify RN    Comment 2 Document in Chart   Glucose, capillary     Status: None   Collection Time: 03/08/15  9:24 PM  Result Value Ref Range   Glucose-Capillary 87 65 - 99 mg/dL  Glucose, capillary     Status: None   Collection Time: 03/08/15  9:52 PM  Result Value Ref Range   Glucose-Capillary 87 65 - 99 mg/dL   Comment 1 Notify RN    Comment 2 Document in Chart   Glucose, capillary     Status: Abnormal   Collection Time: 03/09/15  6:44 AM  Result Value Ref Range   Glucose-Capillary 62 (L) 65 - 99 mg/dL  Glucose, capillary     Status: None   Collection Time: 03/09/15  7:17 AM  Result Value Ref Range   Glucose-Capillary 98 65 - 99 mg/dL    Imaging / Studies: Dg Esophagus W/water Sol Cm  03/08/2015  CLINICAL DATA:  Patient status post Heller myotomy and fundoplication. History  of achalasia. EXAM: ESOPHOGRAM/BARIUM SWALLOW TECHNIQUE: Single contrast examination was performed using  water-soluble. FLUOROSCOPY TIME:  Radiation Exposure Index (as provided by the fluoroscopic device): If the device does not provide the exposure index: Fluoroscopy Time:  1 minutes 50 seconds Number of Acquired Images:  8 COMPARISON:  Upper GI 06/16/2014 FINDINGS: The patient was examined in a semi-upright supine position. Patient swallowed a small moderate volume of oral contrast (59 mL). The contrast flowed through the esophagus into the stomach. There is narrowed stricturing  at the GE junction with only a thin stream ofcontrast passing into stomach. There is significant retention of the oral contrast within the esophagus which is patulous. There is indentation along the lower third of the esophagus over a 9 cm segment. The these findings are similar to the pre surgical esophagram. No evidence of leak. IMPRESSION: 1. Narrow but patent gastroesophageal junction. Contrast does flows into the stomach but a large portion of the contrast remains within the patulous esophagus. Recommend caution in advancing diet. 2. No esophageal leak. Findings conveyed toFloor nurseon 03/08/2015 at13:16. ON call surgeon paged. Electronically Signed   By: Genevive Bi M.D.   On: 03/08/2015 13:17    Medications / Allergies: per chart  Antibiotics: Anti-infectives    Start     Dose/Rate Route Frequency Ordered Stop   03/06/15 0514  cefTRIAXone (ROCEPHIN) 2 g in dextrose 5 % 50 mL IVPB     2 g 100 mL/hr over 30 Minutes Intravenous On call to O.R. 03/06/15 1610 03/06/15 0810   03/06/15 0514  metroNIDAZOLE (FLAGYL) IVPB 500 mg     500 mg 100 mL/hr over 60 Minutes Intravenous On call to O.R. 03/06/15 0514 03/06/15 0755        Note: Portions of this report may have been transcribed using voice recognition software. Every effort was made to ensure accuracy; however, inadvertent computerized transcription errors may be  present.   Any transcriptional errors that result from this process are unintentional.     Ardeth Sportsman, M.D., F.A.C.S. Gastrointestinal and Minimally Invasive Surgery [facility] [address] [place], [place] [address] [phone] Main / Paging   03/09/2015  CARE TEAM:  PCP: Kirt Boys, DO  Outpatient Care Team: Patient Care Team: Kirt Boys, DO as PCP - General (Internal Medicine) Primitivo Gauze, MD as Consulting Physician (Nephrology) Chrystie Nose, MD as Consulting Physician (Cardiology) Jeani Hawking, MD as Consulting Physician (Gastroenterology) Karie Soda, MD as Consulting Physician (General Surgery) Fransisco Hertz, MD as Consulting Physician (Vascular Surgery) Sharee Holster, NP as Nurse Practitioner (Geriatric Medicine) [facility] (Skilled Nursing Facility)  Inpatient Treatment Team: Treatment Team: Attending Provider: Karie Soda, MD; Consulting Physician: Delano Metz, MD; Technician: Ellender Hose, NT; Registered Nurse: Peter Garter, RN; Registered Nurse: Allene Pyo, RN; Physician Assistant: Weston Settle, PA-C; Technician: Margaretha Seeds, NT; Occupational Therapist: Gaye Alken, OT

## 2015-03-10 LAB — RENAL FUNCTION PANEL
ALBUMIN: 2.1 g/dL — AB (ref 3.5–5.0)
Anion gap: 11 (ref 5–15)
BUN: 44 mg/dL — ABNORMAL HIGH (ref 6–20)
CHLORIDE: 98 mmol/L — AB (ref 101–111)
CO2: 25 mmol/L (ref 22–32)
CREATININE: 5.62 mg/dL — AB (ref 0.44–1.00)
Calcium: 7.1 mg/dL — ABNORMAL LOW (ref 8.9–10.3)
GFR, EST AFRICAN AMERICAN: 8 mL/min — AB (ref >60)
GFR, EST NON AFRICAN AMERICAN: 7 mL/min — AB (ref >60)
Glucose, Bld: 127 mg/dL — ABNORMAL HIGH (ref 65–99)
PHOSPHORUS: 6.5 mg/dL — AB (ref 2.5–4.6)
POTASSIUM: 5.7 mmol/L — AB (ref 3.5–5.1)
Sodium: 134 mmol/L — ABNORMAL LOW (ref 135–145)

## 2015-03-10 LAB — CBC
HEMATOCRIT: 32.8 % — AB (ref 36.0–46.0)
Hemoglobin: 10.3 g/dL — ABNORMAL LOW (ref 12.0–15.0)
MCH: 26.9 pg (ref 26.0–34.0)
MCHC: 31.4 g/dL (ref 30.0–36.0)
MCV: 85.6 fL (ref 78.0–100.0)
Platelets: 121 10*3/uL — ABNORMAL LOW (ref 150–400)
RBC: 3.83 MIL/uL — AB (ref 3.87–5.11)
RDW: 19.1 % — ABNORMAL HIGH (ref 11.5–15.5)
WBC: 10.3 10*3/uL (ref 4.0–10.5)

## 2015-03-10 LAB — GLUCOSE, CAPILLARY
GLUCOSE-CAPILLARY: 80 mg/dL (ref 65–99)
GLUCOSE-CAPILLARY: 97 mg/dL (ref 65–99)
GLUCOSE-CAPILLARY: 163 mg/dL — AB (ref 65–99)
Glucose-Capillary: 115 mg/dL — ABNORMAL HIGH (ref 65–99)

## 2015-03-10 MED ORDER — SEVELAMER CARBONATE 800 MG PO TABS
800.0000 mg | ORAL_TABLET | Freq: Three times a day (TID) | ORAL | Status: DC
  Administered 2015-03-10 – 2015-03-11 (×3): 800 mg via ORAL
  Filled 2015-03-10 (×3): qty 1

## 2015-03-10 MED ORDER — NEPRO/CARBSTEADY PO LIQD
237.0000 mL | Freq: Three times a day (TID) | ORAL | Status: DC
  Administered 2015-03-11: 237 mL via ORAL
  Filled 2015-03-10 (×9): qty 237

## 2015-03-10 MED ORDER — SACCHAROMYCES BOULARDII 250 MG PO CAPS
250.0000 mg | ORAL_CAPSULE | Freq: Two times a day (BID) | ORAL | Status: DC
  Administered 2015-03-10 – 2015-03-11 (×4): 250 mg via ORAL
  Filled 2015-03-10 (×5): qty 1

## 2015-03-10 NOTE — Progress Notes (Signed)
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 Active Problems:   HLD (hyperlipidemia)   Anemia of chronic disease   ESRD on hemodialysis (HCC)   Protein-calorie malnutrition, severe (HCC)   Neuropathy (HCC)   Cardiomyopathy, ischemic   Moderate 3V CAD at cath 03/24/14   Peripheral artery disease (HCC)   S/P BKA (below knee amputation) unilateral (HCC)   Chronic systolic heart failure (HCC)   DM (diabetes mellitus) type II controlled with renal manifestation (HCC)   Pressure ulcer   4 Days Post-Op  03/06/2015  Procedure(s): XI ROBOTIC HELLER ESOPHAGOCARDIOMYOTOMY AND PARTIAL TOUPET FUNDOPLICATION  UPPER GI ENDOSCOPY    Plan:  -adv to full liquids today -fever of unknown origin.  CXR with mild RLL opacification but no cough, hypoxia, wheezing nor inc WBC = doubt PNA - follow.  F/u urine C&S ? Need for foley?  Had +BC from 2 weeks ago - getting ancef a little longer. -diarrhea - check stool studies.  D/c laxatives  -cal counts -ESRD - HD per nephrology -skin care -bowel regimen -stays in bed mostly - baseline per PT -VTE prophylaxis- SCDs, etc -mobilize as tolerated to help recovery -Dispo - [facility] when better  D/C patient from hospital when patient meets criteria (anticipate in 1-2 day(s)):  Tolerating oral intake well (pureed) Transferring well Adequate pain control without IV medications Urinating  Having flatus Disposition planning in place   Adin Hector, M.D., F.A.C.S. Gastrointestinal and Minimally Invasive Surgery [facility] [address] [place], [place] [address] [phone] Main /  Paging   03/10/2015  Subjective:  Mild soreness with deep breaths - no cough.  Not SOB In HD. Dr Florene Glen at bedside Spit up broth "maybe it's too greasy"  Objective:  Vital signs:  Filed Vitals:   03/10/15 0712 03/10/15 0730 03/10/15 0800 03/10/15 0830  BP: 91/59 92/52 100/58 106/58  Pulse: 107 100 116 112  Temp:      TempSrc:      Resp:      Height:      Weight:      SpO2:        Last BM Date: 03/09/15  Intake/Output   Yesterday:  01/16 0701 - 01/17 0700 In: 820 [P.O.:720; IV Piggyback:100] Out: 300 [Urine:300] This shift:     Bowel function:  Flatus: y  BM: loose  Drain: n/a  Physical Exam:  General: Pt awake/alert/oriented x4 in no acute distress Eyes: PERRL, normal EOM.  Sclera clear.  No icterus Neuro: CN II-XII intact w/o focal sensory/motor deficits. Lymph: No head/neck/groin lymphadenopathy Psych:  No delerium/psychosis/paranoia HENT: Normocephalic, Mucus membranes moist.  No thrush Neck: Supple, No tracheal deviation Chest: No chest wall pain w good excursion.  Lungs clear w mild dec BS at bases CV:  Pulses intact.  Regular rhythm MS: Normal AROM mjr joints.  No obvious deformity Abdomen: Soft.  Nondistended.  Nontender at incisions.  No evidence of peritonitis.  No incarcerated hernias. Ext:  SCDs BLE.  L AKA stable.  No mjr edema.  No cyanosis Skin: No petechiae / purpura  Results:   Labs: Results for orders placed or performed during the hospital encounter of 03/06/15 (from the past 48 hour(s))  Glucose, capillary     Status: Abnormal   Collection Time: 03/08/15 11:59 AM  Result Value Ref Range   Glucose-Capillary 187 (H) 65 - 99 mg/dL  Glucose, capillary     Status: Abnormal   Collection Time: 03/08/15  5:24 PM  Result Value Ref Range   Glucose-Capillary 110 (H) 65 - 99 mg/dL  Glucose, capillary     Status: None   Collection Time: 03/08/15  7:59 PM  Result Value Ref Range   Glucose-Capillary 98 65 - 99 mg/dL   Comment 1 Notify RN     Comment 2 Document in Chart   Glucose, capillary     Status: None   Collection Time: 03/08/15  9:24 PM  Result Value Ref Range   Glucose-Capillary 87 65 - 99 mg/dL  Glucose, capillary     Status: None   Collection Time: 03/08/15  9:52 PM  Result Value Ref Range   Glucose-Capillary 87 65 - 99 mg/dL   Comment 1 Notify RN    Comment 2 Document in Chart   Glucose, capillary     Status: Abnormal   Collection Time: 03/09/15  6:44 AM  Result Value Ref Range   Glucose-Capillary 62 (L) 65 - 99 mg/dL  Glucose, capillary     Status: None   Collection Time: 03/09/15  7:17 AM  Result Value Ref Range   Glucose-Capillary 98 65 - 99 mg/dL  Glucose, capillary     Status: None   Collection Time: 03/09/15  8:02 AM  Result Value Ref Range   Glucose-Capillary 83 65 - 99 mg/dL   Comment 1 Notify RN   Glucose, capillary     Status: None   Collection Time: 03/09/15 12:19 PM  Result Value Ref Range   Glucose-Capillary 83 65 - 99 mg/dL   Comment 1 Notify RN   Glucose, capillary     Status: Abnormal   Collection Time: 03/09/15  2:50 PM  Result Value Ref Range   Glucose-Capillary 130 (H) 65 - 99 mg/dL   Comment 1 Notify RN   Glucose, capillary     Status: Abnormal   Collection Time: 03/09/15  5:27 PM  Result Value Ref Range   Glucose-Capillary 116 (H) 65 - 99 mg/dL   Comment 1 Notify RN   Urinalysis, Routine w reflex microscopic (not at [facility])     Status: Abnormal   Collection Time: 03/09/15  6:47 PM  Result Value Ref Range   Color, Urine YELLOW YELLOW   APPearance CLOUDY (A) CLEAR   Specific Gravity, Urine 1.016 1.005 - 1.030   pH 7.0 5.0 - 8.0   Glucose, UA NEGATIVE NEGATIVE mg/dL   Hgb urine dipstick MODERATE (A) NEGATIVE   Bilirubin Urine NEGATIVE NEGATIVE   Ketones, ur 15 (A) NEGATIVE mg/dL   Protein, ur 100 (A) NEGATIVE mg/dL   Nitrite NEGATIVE NEGATIVE   Leukocytes, UA LARGE (A) NEGATIVE  Urine microscopic-add on     Status: Abnormal   Collection Time: 03/09/15  6:47 PM  Result  Value Ref Range   Squamous Epithelial / LPF 6-30 (A) NONE SEEN   WBC, UA TOO NUMEROUS TO COUNT 0 - 5 WBC/hpf   RBC / HPF 0-5 0 - 5 RBC/hpf   Bacteria, UA FEW (A) NONE SEEN   Urine-Other MUCOUS PRESENT     Comment: YEAST PRESENT  Glucose, capillary     Status: Abnormal   Collection Time: 03/09/15  9:56 PM  Result Value Ref Range   Glucose-Capillary 116 (H) 65 -  99 mg/dL   Comment 1 Notify RN   CBC     Status: Abnormal   Collection Time: 03/10/15  7:37 AM  Result Value Ref Range   WBC 10.3 4.0 - 10.5 K/uL   RBC 3.83 (L) 3.87 - 5.11 MIL/uL   Hemoglobin 10.3 (L) 12.0 - 15.0 g/dL   HCT 32.8 (L) 36.0 - 46.0 %   MCV 85.6 78.0 - 100.0 fL   MCH 26.9 26.0 - 34.0 pg   MCHC 31.4 30.0 - 36.0 g/dL   RDW 19.1 (H) 11.5 - 15.5 %   Platelets 121 (L) 150 - 400 K/uL  Renal function panel     Status: Abnormal   Collection Time: 03/10/15  7:37 AM  Result Value Ref Range   Sodium 134 (L) 135 - 145 mmol/L   Potassium 5.7 (H) 3.5 - 5.1 mmol/L   Chloride 98 (L) 101 - 111 mmol/L   CO2 25 22 - 32 mmol/L   Glucose, Bld 127 (H) 65 - 99 mg/dL   BUN 44 (H) 6 - 20 mg/dL   Creatinine, Ser 5.62 (H) 0.44 - 1.00 mg/dL   Calcium 7.1 (L) 8.9 - 10.3 mg/dL   Phosphorus 6.5 (H) 2.5 - 4.6 mg/dL   Albumin 2.1 (L) 3.5 - 5.0 g/dL   GFR calc non Af Amer 7 (L) >60 mL/min   GFR calc Af Amer 8 (L) >60 mL/min    Comment: (NOTE) The eGFR has been calculated using the CKD EPI equation. This calculation has not been validated in all clinical situations. eGFR's persistently <60 mL/min signify possible Chronic Kidney Disease.    Anion gap 11 5 - 15    Imaging / Studies: Dg Chest 2 View  03/09/2015  CLINICAL DATA:  Rule out aspiration pneumonia. EXAM: CHEST  2 VIEW COMPARISON:  06/25/2014 FINDINGS: There has been interval removal of dual lumen central venous catheter. 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 The cardiac silhouette is enlarged. Mediastinal contours appear intact. There is  no evidence of pneumothorax. There are bilateral small pleural effusions. Airspace consolidation is seen in the right cardiophrenic angle. Osseous structures are without acute abnormality. IMPRESSION: Enlarged cardiac silhouette. Small bilateral pleural effusions. Airspace opacity in the right cardiophrenic angle, which likely represents focal airspace consolidation. Electronically Signed   By: Fidela Salisbury M.D.   On: 03/09/2015 17:24   Dg Abd 2 Views  03/09/2015  CLINICAL DATA:  Abdominal pain for 4 days, fever EXAM: ABDOMEN - 2 VIEW COMPARISON:  06/12/2014 FINDINGS: There is normal small bowel gas pattern. Degenerative changes are noted lumbar spine. Contrast material noted throughout the colon from recent esophagram study. IMPRESSION: Normal small bowel gas pattern. Residual contrast material throughout the colon. No colonic distension. Degenerative changes lumbar spine. Electronically Signed   By: Lahoma Crocker M.D.   On: 03/09/2015 16:07   Dg Esophagus W/water Sol Cm  03/08/2015  CLINICAL DATA:  Patient status post Heller myotomy and fundoplication. History of achalasia. EXAM: ESOPHOGRAM/BARIUM SWALLOW TECHNIQUE: Single contrast examination was performed using  water-soluble. FLUOROSCOPY TIME:  Radiation Exposure Index (as provided by the fluoroscopic device): If the device does not provide the exposure index: Fluoroscopy Time:  1 minutes 50 seconds Number of Acquired Images:  8 COMPARISON:  Upper GI 06/16/2014 FINDINGS: The patient was examined in a semi-upright supine position. Patient swallowed a small moderate volume of oral contrast (59 mL). The contrast flowed through the esophagus into the stomach. There is narrowed stricturing  at the [place] junction with only a thin stream ofcontrast passing into stomach. There is significant retention of the oral contrast within the esophagus which is patulous. There is indentation along the lower third of the esophagus over a 9 cm segment. The these findings are  similar to the pre surgical esophagram. No evidence of leak. IMPRESSION: 1. Narrow but patent gastroesophageal junction. Contrast does flows into the stomach but a large portion of the contrast remains within the patulous esophagus. Recommend caution in advancing diet. 2. No esophageal leak. Findings conveyed toFloor nurseon 03/08/2015 at13:16. ON call surgeon paged. Electronically Signed   By: Suzy Bouchard M.D.   On: 03/08/2015 13:17    Medications / Allergies: per chart  Antibiotics: Anti-infectives    Start     Dose/Rate Route Frequency Ordered Stop   03/10/15 1200  ceFAZolin (ANCEF) IVPB 1 g/50 mL premix    Comments:  Ends on sat  Jan 26 ,2016  For outpt BC [facility]  See hard copy in chart   1 g 100 mL/hr over 30 Minutes Intravenous Every T-Th-Sa (Hemodialysis) 03/09/15 2353 03/21/15 1159   03/06/15 0514  cefTRIAXone (ROCEPHIN) 2 g in dextrose 5 % 50 mL IVPB     2 g 100 mL/hr over 30 Minutes Intravenous On call to O.R. 03/06/15 4287 03/06/15 0810   03/06/15 0514  metroNIDAZOLE (FLAGYL) IVPB 500 mg     500 mg 100 mL/hr over 60 Minutes Intravenous On call to O.R. 03/06/15 0514 03/06/15 0755        Note: Portions of this report may have been transcribed using voice recognition software. Every effort was made to ensure accuracy; however, inadvertent computerized transcription errors may be present.   Any transcriptional errors that result from this process are unintentional.     Adin Hector, M.D., F.A.C.S. Gastrointestinal and Minimally Invasive Surgery [facility] [address] [place] [address] [place], [place] [address] [phone] Main / Paging   03/10/2015  CARE TEAM:  PCP: Gildardo Cranker, DO  Outpatient Care Team: Patient Care Team: Gildardo Cranker, DO as PCP - General (Internal Medicine) Fleet Contras, MD as Consulting Physician (Nephrology) Pixie Casino, MD as Consulting Physician (Cardiology) Carol Ada, MD as  Consulting Physician (Gastroenterology) Michael Boston, MD as Consulting Physician (General Surgery) Conrad [place]ee, MD as Consulting Physician (Vascular Surgery) Gerlene Fee, NP as Nurse Practitioner (Geriatric Medicine) [place] [facility]  Inpatient Treatment Team: Treatment Team: Attending Provider: Michael Boston, MD; Consulting Physician: Roney Jaffe, MD; Registered Nurse: Willa Frater, RN; Registered Nurse: Ainsley Spinner, RN; Physician Assistant: Alric Seton, PA-C; Technician: Paula Compton, NT; Registered Nurse: Westly Pam, RN; Registered Nurse: Candida Peeling, RN

## 2015-03-10 NOTE — Procedures (Signed)
Problem/Plan: 1. 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 Anemia= hgb 10.6 , no esa monitor hgb  6 PAD L BKA - has been up in chair 7 Hx CVA= at snh 8 Chron indwelling foley 9DM 2 on insulin  10 Pos Blood cultures for strepagalactiae on 02/26/15, treated 1/10 with vancomycin 750mg  IV once and ancef 1gm x2  Tolerating HD. Goal 1500cc. Alert and appropriate.  BFR 400cc/min.  Will resume Ancef 4 mote doses although no clinical evidence of bacteremia.

## 2015-03-10 NOTE — Progress Notes (Signed)
Calorie Count Note  48 hour calorie count ordered.  Diet: clear liquid/ Dysphagia 1 Supplements: Boost Breeze po TID, each supplement provides 250 kcal and 9 grams of protein; 30 ml Prostat BID  Calorie count data incomplete. No meal tickets included in envelope. Reviewed meal completion prior to calorie count; pt averaging 50% of meals. Pt was on a clear liquid diet from Breakfast 03/09/15 until Breakfast 03/10/15. Pt unable to meet nutritional needs on a clear liquid diet, due to limitations of diet. She was advanced to a dysphagia 1 diet at lunch today, which will hopefully allow her to better meet her nutritional goals, due to wider variety of food selections and availability of more nutrient dense foods. Pt is not accepting current supplements well, however, this RD will add Nepro supplement (pt was amenable to this supplement, as she was consuming PTA).   Day 1 Breakfast: 195 kcals, 3 grams protein Lunch: 389 kcals, 3 grams protein Dinner: 299 kcals, 4 grams protein Supplements: Refused Prostat, Boost Breeze BID (500 kcals and 18 grams protein)  Day 2 Breakfast: 195 kcals, 3 grams protein Lunch: 405 kcals, 32 grams protein (if consumed 100%) Dinner: n/a Supplements: 30 ml Prostat (1 refused), Boost Breeze BID (600 kcals, 33 grams protein)  Total intake: 942 kcal (72% of minimum estimated needs)  23 protein (42% of minimum estimated needs)  Nutrition Dx: Malnutrition related to chronic illness as evidenced by severe depletion of body fat, severe depletion of muscle mass; ongoing  Goal: Patient will meet greater than or equal to 90% of their needs; unmet  Intervention:  -D/c Boost Breeze po TID, each supplement provides 250 kcal and 9 grams of protein -D/c 30 ml Prostat BID -Nepro Shake po TID, each supplement provides 425 kcal and 19 grams protein  Nicolaas Savo A. Mayford Knife, RD, LDN, CDE Pager: [phone] After hours Pager: [phone]

## 2015-03-11 LAB — URINE CULTURE: SPECIAL REQUESTS: NORMAL

## 2015-03-11 LAB — GLUCOSE, CAPILLARY
GLUCOSE-CAPILLARY: 78 mg/dL (ref 65–99)
GLUCOSE-CAPILLARY: 87 mg/dL (ref 65–99)
GLUCOSE-CAPILLARY: 173 mg/dL — AB (ref 65–99)
GLUCOSE-CAPILLARY: 30 mg/dL — AB (ref 65–99)
GLUCOSE-CAPILLARY: 57 mg/dL — AB (ref 65–99)
GLUCOSE-CAPILLARY: 169 mg/dL — AB (ref 65–99)
Glucose-Capillary: 61 mg/dL — ABNORMAL LOW (ref 65–99)

## 2015-03-11 LAB — C DIFFICILE QUICK SCREEN W PCR REFLEX
C DIFFICILE (CDIFF) TOXIN: NEGATIVE
C DIFFICLE (CDIFF) ANTIGEN: NEGATIVE
C Diff interpretation: NEGATIVE

## 2015-03-11 MED ORDER — DARBEPOETIN ALFA 60 MCG/0.3ML IJ SOSY
60.0000 ug | PREFILLED_SYRINGE | INTRAMUSCULAR | Status: DC
  Administered 2015-03-12: 60 ug via INTRAVENOUS
  Filled 2015-03-11: qty 0.3

## 2015-03-11 MED ORDER — DOXERCALCIFEROL 4 MCG/2ML IV SOLN
3.0000 ug | INTRAVENOUS | Status: DC
  Administered 2015-03-12: 3 ug via INTRAVENOUS
  Filled 2015-03-11: qty 2

## 2015-03-11 MED ORDER — DEXTROSE 50 % IV SOLN
INTRAVENOUS | Status: AC
  Administered 2015-03-11: 50 mL
  Filled 2015-03-11: qty 50

## 2015-03-11 MED ORDER — SEVELAMER CARBONATE 800 MG PO TABS: 1600.0000 mg | ORAL_TABLET | Freq: Three times a day (TID) | ORAL | Status: DC

## 2015-03-11 MED ORDER — GLUCOSE 40 % PO GEL
ORAL | Status: AC
  Filled 2015-03-11: qty 1

## 2015-03-11 MED ORDER — SEVELAMER CARBONATE 800 MG PO TABS
800.0000 mg | ORAL_TABLET | Freq: Three times a day (TID) | ORAL | Status: DC
  Filled 2015-03-11 (×3): qty 1

## 2015-03-11 NOTE — Progress Notes (Signed)
Patient CBG 30. Dextrose given. CBG rechecked 15 mins later 178. Dr. Michaell Cowing notified

## 2015-03-11 NOTE — Discharge Summary (Addendum)
Physician Discharge Summary  Patient ID: Elizabeth [name] MRN: [id number] DOB/AGE: 71-12-1945 71 y.o.  Admit date: 03/06/2015 Discharge date: 03/13/2015  Patient Care Team: Gildardo Cranker, DO as PCP - General (Internal Medicine) Fleet Contras, MD as Consulting Physician (Nephrology) Pixie Casino, MD as Consulting Physician (Cardiology) Carol Ada, MD as Consulting Physician (Gastroenterology) Michael Boston, MD as Consulting Physician (General Surgery) Conrad [place], MD as Consulting Physician (Vascular Surgery) Gerlene Fee, NP as Nurse Practitioner (Geriatric Medicine) [place] ([place])  Admission Diagnoses: Principal Problem:   Achalasia of esophagus s/p Heller/Toupet 03/06/2015 Active Problems:   HLD (hyperlipidemia)   Anemia of chronic disease   ESRD on hemodialysis ([place])   Protein-calorie malnutrition, severe (HCC)   Neuropathy ([place])   Cardiomyopathy, ischemic   Moderate 3V CAD at cath 03/24/14   Peripheral artery disease ([place])   S/P BKA (below knee amputation) unilateral (HCC)   Chronic systolic heart failure ([place])   DM (diabetes mellitus) type II controlled with renal manifestation ([place])   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 ([place])   Pressure ulcer   Yeast UTI   Discharge Diagnoses:  Principal Problem:   Achalasia of esophagus s/p Heller/Toupet 03/06/2015 Active Problems:   HLD (hyperlipidemia)   Anemia of chronic disease   ESRD on hemodialysis ([place])   Protein-calorie malnutrition, severe (HCC)   Neuropathy ([place])   Cardiomyopathy, ischemic   Moderate 3V CAD at cath 03/24/14   Peripheral artery disease ([place])   S/P BKA (below knee amputation) unilateral (HCC)   Chronic systolic heart failure (HCC)   DM (diabetes mellitus) type II controlled with renal manifestation ([place])   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  Robotic Heller Anterior Esophagocardiomyotomy 2. Toupet (posterior [id number]) fundoplication x 4cm. 3. Anterior & posterior gastropexy 4. Type 2 mediastinal dissection. 5. EGD  SURGEON: Michael Boston, MD  Assist: Johnathan Hausen, MD  Consults: nephrology  PT [facility] Course:   The patient is a nonoliguric renal failure dialysis-dependent patient with worsening dysphagia to the point of intolerance to even liquids.  Workup consistent with achalasia.  She underwent surgical therapy surgery above.  She had inadvertently received some solid food POD#1.  UGI study showed some retained food.  She was kept on liquids..  She had mild episodes of spitting up at first.  Gradually dysphasia decreased and she was tolerating pureed diet.  She noted her swallowing tolerance was much improved from preoperatively.  Followed by nutrition.  Education for period diet given.  Calorie counts done.  Intermittently fair oral intake.  She was followed closely by nephrology.  She had inpatient dialysis on her usual Tuesday/Thursday/Saturday schedule.  She did have a rather old Foley catheter that was exchanged and cleaned up.  Initial urinalysis suspicious for urinary tract infection.  Urine culture came back greater than 100,000 yeast.  Given fluconazole.  Again Foley catheter was exchanged at admission.  Efforts were made for therapy.  Patient with left above-the-knee amputation and tends to be bedridden but some mobility done.  She did have an episode of fever to 101.8.  She had had some positive blood cultures 2 weeks ago with staff.  She had receiving IV vancomycin.  She received a few more doses of IV cefazolin under recommendation.  Chest x-ray showed some mild stranding in the right lung but no evidence  of any productive sputum and no leukocytosis.  Patient denies any aspiration or choking event.  Not consistent with aspiration pneumonia/pneumonitis.   Patient did have many loose bowel movements.  It  improved after holding off on her usual anticonstipation regimen.  C. difficile was checked & was negative.  Controlled with Imodium low-dose.  By the time of discharge, the patient was eating pureed food with her usual fair appetite, having flatus.  Taking some supplemental shakes.  Pain was well-controlled on an oral medications.  Based on meeting discharge criteria and continuing to recover, I felt it was safe for the patient to be discharged from the hospital to further recover with close followup.  Most likely can advance to a mechanically soft diet in a week or so once dysphagia goes down.  Therapy consultations as well as case management consultations done.  Arrangements made to allow her to return back to her usual residence at [facility]- SNF Postoperative recommendations were discussed in detail.  They are written as well.   Significant Diagnostic Studies:  Results for orders placed or performed during the hospital encounter of 03/06/15 (from the past 72 hour(s))  Glucose, capillary     Status: Abnormal   Collection Time: 03/08/15 11:59 AM  Result Value Ref Range   Glucose-Capillary 187 (H) 65 - 99 mg/dL  Glucose, capillary     Status: Abnormal   Collection Time: 03/08/15  5:24 PM  Result Value Ref Range   Glucose-Capillary 110 (H) 65 - 99 mg/dL  Glucose, capillary     Status: None   Collection Time: 03/08/15  7:59 PM  Result Value Ref Range   Glucose-Capillary 98 65 - 99 mg/dL   Comment 1 Notify RN    Comment 2 Document in Chart   Glucose, capillary     Status: None   Collection Time: 03/08/15  9:24 PM  Result Value Ref Range   Glucose-Capillary 87 65 - 99 mg/dL  Glucose, capillary     Status: None   Collection Time: 03/08/15  9:52 PM  Result Value Ref Range   Glucose-Capillary 87 65 - 99 mg/dL   Comment 1 Notify RN    Comment 2 Document in Chart   Glucose, capillary     Status: Abnormal   Collection Time: 03/09/15  6:44 AM  Result Value Ref Range   Glucose-Capillary 62  (L) 65 - 99 mg/dL  Glucose, capillary     Status: None   Collection Time: 03/09/15  7:17 AM  Result Value Ref Range   Glucose-Capillary 98 65 - 99 mg/dL  Glucose, capillary     Status: None   Collection Time: 03/09/15  8:02 AM  Result Value Ref Range   Glucose-Capillary 83 65 - 99 mg/dL   Comment 1 Notify RN   Glucose, capillary     Status: None   Collection Time: 03/09/15 12:19 PM  Result Value Ref Range   Glucose-Capillary 83 65 - 99 mg/dL   Comment 1 Notify RN   Glucose, capillary     Status: Abnormal   Collection Time: 03/09/15  2:50 PM  Result Value Ref Range   Glucose-Capillary 130 (H) 65 - 99 mg/dL   Comment 1 Notify RN   Glucose, capillary     Status: Abnormal   Collection Time: 03/09/15  5:27 PM  Result Value Ref Range   Glucose-Capillary 116 (H) 65 - 99 mg/dL   Comment 1 Notify RN   Urinalysis, Routine w reflex microscopic (not at [facility])  Status: Abnormal   Collection Time: 03/09/15  6:47 PM  Result Value Ref Range   Color, Urine YELLOW YELLOW   APPearance CLOUDY (A) CLEAR   Specific Gravity, Urine 1.016 1.005 - 1.030   pH 7.0 5.0 - 8.0   Glucose, UA NEGATIVE NEGATIVE mg/dL   Hgb urine dipstick MODERATE (A) NEGATIVE   Bilirubin Urine NEGATIVE NEGATIVE   Ketones, ur 15 (A) NEGATIVE mg/dL   Protein, ur 100 (A) NEGATIVE mg/dL   Nitrite NEGATIVE NEGATIVE   Leukocytes, UA LARGE (A) NEGATIVE  Urine microscopic-add on     Status: Abnormal   Collection Time: 03/09/15  6:47 PM  Result Value Ref Range   Squamous Epithelial / LPF 6-30 (A) NONE SEEN   WBC, UA TOO NUMEROUS TO COUNT 0 - 5 WBC/hpf   RBC / HPF 0-5 0 - 5 RBC/hpf   Bacteria, UA FEW (A) NONE SEEN   Urine-Other MUCOUS PRESENT     Comment: YEAST PRESENT  Urine culture     Status: None (Preliminary result)   Collection Time: 03/09/15  6:48 PM  Result Value Ref Range   Specimen Description URINE, CATHETERIZED    Special Requests Normal    Culture TOO YOUNG TO READ    Report Status PENDING   Culture, blood  (Routine X 2) w Reflex to ID Panel     Status: None (Preliminary result)   Collection Time: 03/09/15  7:00 PM  Result Value Ref Range   Specimen Description BLOOD LEFT HAND    Special Requests IN PEDIATRIC BOTTLE 3CC    Culture NO GROWTH < 24 HOURS    Report Status PENDING   Culture, blood (Routine X 2) w Reflex to ID Panel     Status: None (Preliminary result)   Collection Time: 03/09/15  7:07 PM  Result Value Ref Range   Specimen Description BLOOD LEFT ANTECUBITAL    Special Requests BOTTLES DRAWN AEROBIC AND ANAEROBIC 5CC    Culture NO GROWTH < 24 HOURS    Report Status PENDING   Glucose, capillary     Status: Abnormal   Collection Time: 03/09/15  9:56 PM  Result Value Ref Range   Glucose-Capillary 116 (H) 65 - 99 mg/dL   Comment 1 Notify RN   Glucose, capillary     Status: None   Collection Time: 03/10/15  7:17 AM  Result Value Ref Range   Glucose-Capillary 80 65 - 99 mg/dL  CBC     Status: Abnormal   Collection Time: 03/10/15  7:37 AM  Result Value Ref Range   WBC 10.3 4.0 - 10.5 K/uL   RBC 3.83 (L) 3.87 - 5.11 MIL/uL   Hemoglobin 10.3 (L) 12.0 - 15.0 g/dL   HCT 32.8 (L) 36.0 - 46.0 %   MCV 85.6 78.0 - 100.0 fL   MCH 26.9 26.0 - 34.0 pg   MCHC 31.4 30.0 - 36.0 g/dL   RDW 19.1 (H) 11.5 - 15.5 %   Platelets 121 (L) 150 - 400 K/uL  Renal function panel     Status: Abnormal   Collection Time: 03/10/15  7:37 AM  Result Value Ref Range   Sodium 134 (L) 135 - 145 mmol/L   Potassium 5.7 (H) 3.5 - 5.1 mmol/L   Chloride 98 (L) 101 - 111 mmol/L   CO2 25 22 - 32 mmol/L   Glucose, Bld 127 (H) 65 - 99 mg/dL   BUN 44 (H) 6 - 20 mg/dL   Creatinine, Ser 5.62 (H) 0.44 -  1.00 mg/dL   Calcium 7.1 (L) 8.9 - 10.3 mg/dL   Phosphorus 6.5 (H) 2.5 - 4.6 mg/dL   Albumin 2.1 (L) 3.5 - 5.0 g/dL   GFR calc non Af Amer 7 (L) >60 mL/min   GFR calc Af Amer 8 (L) >60 mL/min    Comment: (NOTE) The eGFR has been calculated using the CKD EPI equation. This calculation has not been validated in  all clinical situations. eGFR's persistently <60 mL/min signify possible Chronic Kidney Disease.    Anion gap 11 5 - 15  Glucose, capillary     Status: None   Collection Time: 03/10/15 12:46 PM  Result Value Ref Range   Glucose-Capillary 97 65 - 99 mg/dL  Glucose, capillary     Status: Abnormal   Collection Time: 03/10/15  5:00 PM  Result Value Ref Range   Glucose-Capillary 115 (H) 65 - 99 mg/dL  Glucose, capillary     Status: Abnormal   Collection Time: 03/10/15  9:36 PM  Result Value Ref Range   Glucose-Capillary 163 (H) 65 - 99 mg/dL  Glucose, capillary     Status: None   Collection Time: 03/11/15  7:40 AM  Result Value Ref Range   Glucose-Capillary 78 65 - 99 mg/dL   Comment 1 Notify RN     Dg Chest 2 View  03/09/2015  CLINICAL DATA:  Rule out aspiration pneumonia. EXAM: CHEST  2 VIEW COMPARISON:  06/25/2014 FINDINGS: There has been interval removal of dual lumen central venous catheter. 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 The cardiac silhouette is enlarged. Mediastinal contours appear intact. There is no evidence of pneumothorax. There are bilateral small pleural effusions. Airspace consolidation is seen in the right cardiophrenic angle. Osseous structures are without acute abnormality. IMPRESSION: Enlarged cardiac silhouette. Small bilateral pleural effusions. Airspace opacity in the right cardiophrenic angle, which likely represents focal airspace consolidation. Electronically Signed   By: Fidela Salisbury M.D.   On: 03/09/2015 17:24   Dg Abd 2 Views  03/09/2015  CLINICAL DATA:  Abdominal pain for 4 days, fever EXAM: ABDOMEN - 2 VIEW COMPARISON:  06/12/2014 FINDINGS: There is normal small bowel gas pattern. Degenerative changes are noted lumbar spine. Contrast material noted throughout the colon from recent esophagram study. IMPRESSION: Normal small bowel gas pattern. Residual contrast material throughout the colon. No colonic distension.  Degenerative changes lumbar spine. Electronically Signed   By: Lahoma Crocker M.D.   On: 03/09/2015 16:07   Dg Esophagus W/water Sol Cm  03/08/2015  CLINICAL DATA:  Patient status post Heller myotomy and fundoplication. History of achalasia. EXAM: ESOPHOGRAM/BARIUM SWALLOW TECHNIQUE: Single contrast examination was performed using  water-soluble. FLUOROSCOPY TIME:  Radiation Exposure Index (as provided by the fluoroscopic device): If the device does not provide the exposure index: Fluoroscopy Time:  1 minutes 50 seconds Number of Acquired Images:  8 COMPARISON:  Upper GI 06/16/2014 FINDINGS: The patient was examined in a semi-upright supine position. Patient swallowed a small moderate volume of oral contrast (59 mL). The contrast flowed through the esophagus into the stomach. There is narrowed stricturing at the GE junction with only a thin stream ofcontrast passing into stomach. There is significant retention of the oral contrast within the esophagus which is patulous. There is indentation along the lower third of the esophagus over a 9 cm segment. The these findings are similar to the pre surgical esophagram. No evidence of leak. IMPRESSION: 1. Narrow but patent gastroesophageal junction. Contrast does flows  into the stomach but a large portion of the contrast remains within the patulous esophagus. Recommend caution in advancing diet. 2. No esophageal leak. Findings conveyed toFloor nurseon 03/08/2015 at13:16. ON call surgeon paged. Electronically Signed   By: Suzy Bouchard M.D.   On: 03/08/2015 13:17    Discharge Exam: Blood pressure 91/56, pulse 98, temperature 97.9 F (36.6 C), temperature source Oral, resp. rate 16, height 5' 3"  (1.6 m), weight 37.2 kg (82 lb 0.2 oz), SpO2 99 %.  General: Pt awake/alert/oriented x4 in no acute distress Eyes: PERRL, normal EOM. Sclera clear. No icterus Neuro: CN II-XII intact w/o focal sensory/motor deficits. Lymph: No head/neck/groin lymphadenopathy Psych: No  delerium/psychosis/paranoia HENT: Normocephalic, Mucus membranes moist. No thrush Neck: Supple, No tracheal deviation Chest: No chest wall pain w good excursion. Lungs clear w mild dec BS at bases. Mild lateral chest wall soreness CV: Pulses intact. Regular rhythm MS: Normal AROM mjr joints. No obvious deformity Abdomen: Soft. Nondistended. Nontender at incisions. No evidence of peritonitis. No incarcerated hernias. Ext: SCDs BLE. L AKA stable. No mjr edema. No cyanosis Skin: No petechiae / purpura.  Chronic sacral decubitus stable  Discharged Condition: fair but stable   Past Medical History  Diagnosis Date  . Coronary artery disease, non-occlusive January 2016    Moderate p&mLAD & Ostial OM; Myoview Nuclear Stress Test 03/21/2014: EF 35%. Mild hypokinesis of the inferior wall with fixed inferior defect suggesting scar. (no culprit lesion on CATH)  . Peripheral vascular disease of lower extremity with ulceration ([place]) January-March 2016    PV Angiogram 03/19/2014: Bilateral AP and PT artery occlusions, 90% Left peroneal artery with single-vessel Right peroneal and flow  . Critical lower limb ischemia     Status post left BKA following left popliteal and peroneal artery endarterectomy with patch angioplasty.  . Diabetic neuropathy ([place])   . Hyperlipidemia with target LDL less than 70   . History of CVA (cerebrovascular accident) 08/19/2013    "minor one"  . ESRD (end stage renal disease) ([place])     [address], [place], S (06/12/2014)  . Urinary retention     DX NEUROGENIC BLADDER--HAS INDWELLING FOLEY CATHETER  . Foley catheter in place     for urinary retention  . Anemia   . Hematemesis April 2016    Hospitalized  . Arthritis of knee, right     "right knee" (06/12/2014)  . Gout     "was in my LLE"  . Diabetes mellitus, insulin dependent (IDDM), uncontrolled ([place])     [place]; has h/o Hypo & Hyperglycemic episodes, Type II  . Foot infection   . Loss of  consciousness 08/19/2013  . UTI (urinary tract infection) 10/17/2013  . Acute hyperkalemia 12/24/2013    Past Surgical History  Procedure Laterality Date  . Insertion of suprapubic catheter N/A 05/16/2012    Procedure: INSERTION OF SUPRAPUBIC CATHETER;  Surgeon: Alexis Frock, MD;  Location: [facility];  Service: Urology;  Laterality: N/A;  . Bascilic vein transposition Left 10/30/2012    Procedure: LEFT 1ST STAGE BASCILIC VEIN TRANSPOSITION;  Surgeon: Angelia Mould, MD;  Location: [place];  Service: Vascular;  Laterality: Left;  . Nm myocar perf wall motion  04/19/2012/ 02/2014    a) low risk study-EF 44%,mild inferolateral hypkinesis; b) EF 35%. Mild hypokinesis of the inferior wall with fixed inferior defect suggesting scar.  . Bascilic vein transposition Left 02/05/2013    Procedure: BASCILIC VEIN TRANSPOSITION 2ND STAGE;  Surgeon: Angelia Mould, MD;  Location:  [place] OR;  Service: Vascular;  Laterality: Left;  . Radiology with anesthesia N/A 10/25/2013    Procedure: RADIOLOGY WITH ANESTHESIA;  Surgeon: Medication Radiologist, MD;  Location: [place];  Service: Radiology;  Laterality: N/A;  . Fistulogram Left 04/29/2013    Procedure: FISTULOGRAM;  Surgeon: Angelia Mould, MD;  Location[facility] C[facility] LAB;  Service: Cardiovascular;  Laterality: Left;  . Angioplasty Left 04/29/2013    Procedure: ANGIOPLASTY;  Surgeon: Angelia Mould, MD;  Location: [facility] C[facility] LAB;  Service: Cardiovascular;  Laterality: Left;  . Abdominal aortagram N/A 03/19/2014    Procedure: ABDOMINAL Maxcine Ham;  Surgeon: Elam Dutch, MD;  Location: [facility] CATH LAB;  Service: Cardiovascular;  Laterality: N/A;  . Left and right heart catheterization with coronary angiogram N/A 03/24/2014    Procedure: LEFT AND RIGHT HEART CATHETERIZATION WITH CORONARY ANGIOGRAM;  Surgeon: Leonie Man, MD;  Location: [facility] CATH LAB;  Service: Cardiovascular; Moderate disease in p-mLAD & Ostial OM1; normal right heart pressures. Wedge pressure 29  mmHg. --No lesion to explain inferior defect on Myoview and echocardiogram.   . Endarterectomy popliteal Left 03/26/2014    Procedure: Left popliteal and peroneal artery endarterectomy with vein patch angioplasty;  Surgeon: Elam Dutch, MD;  Location: [place];  Service: Vascular;  Laterality: Left;  . Amputation Left 03/26/2014    Procedure: AMPUTATION SECOND LEFT TOE;  Surgeon: Elam Dutch, MD;  Location: [place];  Service: Vascular;  Laterality: Left;  . Av fistula placement Left 04/28/2014    Procedure: CREATION OF LEFT BRACHIOCEPHALIC  ARTERIOVENOUS (AV) FISTULA CREATION;  Surgeon: Conrad Auberry, MD;  Location: [place];  Service: Vascular;  Laterality: Left;  . Amputation Left 05/10/2014    Procedure: AMPUTATION BELOW KNEE;  Surgeon: Rosetta Posner, MD;  Location[facility] OR;  Service: Vascular;  Laterality: Left;  . Esophagogastroduodenoscopy N/A 06/13/2014    Procedure: ESOPHAGOGASTRODUODENOSCOPY (EGD);  Surgeon: Carol Ada, MD;  Location: [facility] ENDOSCOPY;  Service: Endoscopy;  Laterality: N/A;  . Transthoracic echocardiogram  03/22/2014    EF 30-35%, mild LVH. Severe inferior hypokinesis. Mild MR.  Marland Kitchen Below knee leg amputation Left   . Fistulogram Left 10/08/2014    Procedure: LEFT ARM FISTULOGRAM;  Surgeon: Conrad Koshkonong, MD;  Location: [place];  Service: Vascular;  Laterality: Left;  . Venoplasty Left 10/08/2014    Procedure: LEFT CEPHALIC VEIN VENOPLASTY;  Surgeon: Conrad Providence, MD;  Location: [place];  Service: Vascular;  Laterality: Left;  . Incision and drainage of wound N/A 10/29/2014    Procedure: IRRIGATION AND DEBRIDEMENT OF SACRAL WOUND, PLACEMENT OF A-CELL;  Surgeon: Theodoro Kos, DO;  Location: [place];  Service: Plastics;  Laterality: N/A;  . Application of a-cell of extremity N/A 10/29/2014    Procedure: PLACEMENT OF APPLICATION OF A-CELL;  Surgeon: Theodoro Kos, DO;  Location: [place];  Service: Plastics;  Laterality: N/A;  . Esophageal manometry N/A 11/24/2014    Procedure: ESOPHAGEAL MANOMETRY  (EM);  Surgeon: Carol Ada, MD;  [facility];  Service: Endoscopy;  Laterality: N/A;  . Upper gi endoscopy  03/06/2015    Procedure: UPPER GI ENDOSCOPY;  Surgeon: Michael Boston, MD;  Location: [facility];  Service: General;;    Social History   Social History  . Marital Status: Single    Spouse Name: N/A  . Number of Children: N/A  . Years of Education: N/A   Occupational History  . Not on file.   Social History Main Topics  . Smoking status: Never  Smoker   . Smokeless tobacco: Never Used  . Alcohol Use: No     Comment: "stopped drinking in 2013 drank a couple beers/day, 2 days/wk"  . Drug Use: No  . Sexual Activity: No   Other Topics Concern  . Not on file   Social History Narrative   Granddaughter is contact    Family History  Problem Relation Age of Onset  . Hyperlipidemia Mother   . Hypertension Mother   . Hodgkin's lymphoma Father   . Heart disease Sister     Current Facility-Administered Medications  Medication Dose Route Frequency Provider Last Rate Last Dose  . 0.9 %  sodium chloride infusion  250 mL Intravenous PRN Michael Boston, MD      . acetaminophen (TYLENOL) tablet 325-650 mg  325-650 mg Oral Q6H PRN Michael Boston, MD   650 mg at 03/09/15 1516  . aspirin EC tablet 81 mg  81 mg Oral Daily Michael Boston, MD   81 mg at 03/10/15 1121  . ceFAZolin (ANCEF) IVPB 1 g/50 mL premix  1 g Intravenous Q T,Th,Sa-HD Ernest Haber, PA-C   1 g at 03/10/15 1029  . diphenhydrAMINE (BENADRYL) injection 12.5-25 mg  12.5-25 mg Intravenous Q6H PRN Michael Boston, MD      . feeding supplement (NEPRO CARB STEADY) liquid 237 mL  237 mL Oral TID BM Jenifer A Williams, RD   237 mL at 03/10/15 2011  . heparin injection 5,000 Units  5,000 Units Subcutaneous 3 times per day Michael Boston, MD   5,000 Units at 03/11/15 0535  . HYDROmorphone (DILAUDID) injection 0.5-2 mg  0.5-2 mg Intravenous Q2H PRN Michael Boston, MD      . insulin aspart (novoLOG) injection 0-5 Units  0-5 Units  Subcutaneous QHS Michael Boston, MD   0 Units at 03/06/15 2200  . insulin aspart (novoLOG) injection 0-9 Units  0-9 Units Subcutaneous TID WC Michael Boston, MD   2 Units at 03/08/15 1309  . insulin glargine (LANTUS) injection 3 Units  3 Units Subcutaneous BID Michael Boston, MD   3 Units at 03/10/15 2149  . lidocaine-prilocaine (EMLA) cream   Topical Q T,Th,Sa-HD Michael Boston, MD      . lip balm (CARMEX) ointment 1 application  1 application Topical BID Michael Boston, MD   1 application at 00/86/76 2200  . loperamide (IMODIUM) capsule 2 mg  2 mg Oral Q12H PRN Michael Boston, MD      . loratadine (CLARITIN) tablet 10 mg  10 mg Oral Daily Michael Boston, MD   10 mg at 03/10/15 1121  . magic mouthwash  15 mL Oral QID PRN Michael Boston, MD      . menthol-cetylpyridinium (CEPACOL) lozenge 3 mg  1 lozenge Oral PRN Michael Boston, MD      . metoCLOPramide (REGLAN) injection 5-10 mg  5-10 mg Intravenous Q6H PRN Michael Boston, MD      . metoprolol (LOPRESSOR) injection 5 mg  5 mg Intravenous Q6H PRN Michael Boston, MD      . multivitamin with minerals tablet 1 tablet  1 tablet Oral Daily Michael Boston, MD   1 tablet at 03/10/15 1121  . oxyCODONE (Oxy IR/ROXICODONE) immediate release tablet 5-10 mg  5-10 mg Oral Q4H PRN Michael Boston, MD      . pantoprazole (PROTONIX) EC tablet 40 mg  40 mg Oral Daily Michael Boston, MD   40 mg at 03/10/15 1123  . phenol (CHLORASEPTIC) mouth spray 2 spray  2 spray Mouth/Throat PRN Remo Lipps  Hutton Pellicane, MD      . pravastatin (PRAVACHOL) tablet 20 mg  20 mg Oral QPM Michael Boston, MD   20 mg at 03/10/15 1756  . promethazine (PHENERGAN) tablet 25 mg  25 mg Oral Q6H PRN Michael Boston, MD      . saccharomyces boulardii (FLORASTOR) capsule 250 mg  250 mg Oral BID Michael Boston, MD   250 mg at 03/10/15 2150  . sevelamer carbonate (RENVELA) tablet 800 mg  800 mg Oral TID WC Michael Boston, MD   800 mg at 03/10/15 1756  . sodium chloride 0.9 % injection 3 mL  3 mL Intravenous Q12H Michael Boston, MD   3 mL at  03/09/15 2123  . sodium chloride 0.9 % injection 3 mL  3 mL Intravenous PRN Michael Boston, MD         No Known Allergies  Disposition: 01-Home or Self Care   PATIENT SHOULD BE ON A DYSPHAGIA 1 ("pureed") DIET UNTIL 03/16/2015, THEN ADVANCE AS TOLERATED TO SOFT DIET  Discharge Instructions    Call MD for:  extreme fatigue    Complete by:  As directed      Call MD for:  extreme fatigue    Complete by:  As directed      Call MD for:  hives    Complete by:  As directed      Call MD for:  hives    Complete by:  As directed      Call MD for:  persistant nausea and vomiting    Complete by:  As directed      Call MD for:  persistant nausea and vomiting    Complete by:  As directed      Call MD for:  redness, tenderness, or signs of infection (pain, swelling, redness, odor or green/yellow discharge around incision site)    Complete by:  As directed      Call MD for:  redness, tenderness, or signs of infection (pain, swelling, redness, odor or green/yellow discharge around incision site)    Complete by:  As directed      Call MD for:  severe uncontrolled pain    Complete by:  As directed      Call MD for:  severe uncontrolled pain    Complete by:  As directed      Call MD for:    Complete by:  As directed   Temperature > 101.6F     Call MD for:    Complete by:  As directed   Temperature > 101.6F     Discharge instructions    Complete by:  As directed   Please see discharge instruction sheets.  Also refer to handout given an office.  Please call our office if you have any questions or concerns [phone]     Discharge instructions    Complete by:  As directed   Please see discharge instruction sheets.  Also refer to handout given an office.  Please call our office if you have any questions or concerns [phone]     Discharge wound care:    Complete by:  As directed   If you have closed incisions, shower and bathe over these incisions with soap and water every day.  Remove all  surgical dressings on postoperative day #3.  You do not need to replace dressings over the closed incisions unless you feel more comfortable with a Band-Aid covering it.   If you have an open wound that requires  packing, please see wound care instructions.  In general, remove all dressings, wash wound with soap and water and then replace with saline moistened gauze.  Do the dressing change at least every day.  Please call our office [phone] if you have further questions.     Discharge wound care:    Complete by:  As directed   If you have closed incisions, shower and bathe over these incisions with soap and water every day.  Remove all surgical dressings on postoperative day #3.  You do not need to replace dressings over the closed incisions unless you feel more comfortable with a Band-Aid covering it.   If you have an open wound that requires packing, please see wound care instructions.  In general, remove all dressings, wash wound with soap and water and then replace with saline moistened gauze.  Do the dressing change at least every day.  Please call our office [phone] if you have further questions.     Driving Restrictions    Complete by:  As directed   No driving until off narcotics and can safely swerve away without pain during an emergency     Driving Restrictions    Complete by:  As directed   No driving until off narcotics and can safely swerve away without pain during an emergency     Increase activity slowly    Complete by:  As directed   Walk an hour a day.  Use 20-30 minute walks.  When you can walk 30 minutes without difficulty, it is fine to restart low impact/moderate activities such as biking, jogging, swimming, sexual activity, etc.  Eventually you can increase to unrestricted activity when not feeling pain.  If you feel pain: STOP!Marland Kitchen   Let pain protect you from overdoing it.  Use ice/heat & over-the-counter pain medications to help minimize soreness.  If that is not  enough, then use your narcotic pain prescription as needed to remain active.  It is better to take extra pain medications and be more active than to stay bedridden to avoid all pain medications.     Increase activity slowly    Complete by:  As directed   Walk an hour a day.  Use 20-30 minute walks.  When you can walk 30 minutes without difficulty, it is fine to restart low impact/moderate activities such as biking, jogging, swimming, sexual activity, etc.  Eventually you can increase to unrestricted activity when not feeling pain.  If you feel pain: STOP!Marland Kitchen   Let pain protect you from overdoing it.  Use ice/heat & over-the-counter pain medications to help minimize soreness.  If that is not enough, then use your narcotic pain prescription as needed to remain active.  It is better to take extra pain medications and be more active than to stay bedridden to avoid all pain medications.     Lifting restrictions    Complete by:  As directed   Avoid heavy lifting initially.  Do not push through pain.  You have no specific weight limit - if it hurts to do, DON'T DO IT.   If you feel no pain, you are not injuring anything.  Pain will protect you from injury.  Coughing and sneezing are far more stressful to your incision than any lifting.  Avoid resuming heavy lifting / intense activity until off all narcotic pain medications.  When ready to exercise more, give yourself 2 weeks to gradually get back to full intense exercise/activity.     Lifting restrictions  Complete by:  As directed   Avoid heavy lifting initially.  Do not push through pain.  You have no specific weight limit - if it hurts to do, DON'T DO IT.   If you feel no pain, you are not injuring anything.  Pain will protect you from injury.  Coughing and sneezing are far more stressful to your incision than any lifting.  Avoid resuming heavy lifting / intense activity until off all narcotic pain medications.  When ready to exercise more, give yourself 2  weeks to gradually get back to full intense exercise/activity.     May shower / Bathe    Complete by:  As directed      May shower / Bathe    Complete by:  As directed      May walk up steps    Complete by:  As directed      May walk up steps    Complete by:  As directed      Sexual Activity Restrictions    Complete by:  As directed   Sexual activity as tolerated.  Do not push through pain.  Pain will protect you from injury.     Sexual Activity Restrictions    Complete by:  As directed   Sexual activity as tolerated.  Do not push through pain.  Pain will protect you from injury.     Walk with assistance    Complete by:  As directed   Walk over an hour a day.  May use a walker/cane/companion to help with balance and stamina.     Walk with assistance    Complete by:  As directed   Walk over an hour a day.  May use a walker/cane/companion to help with balance and stamina.            Medication List    STOP taking these medications        oxyCODONE-acetaminophen 5-325 MG tablet  Commonly known as:  PERCOCET/ROXICET      TAKE these medications        aspirin EC 81 MG tablet  Take 81 mg by mouth daily.     cetirizine 10 MG tablet  Commonly known as:  ZYRTEC  Take 10 mg by mouth daily as needed for allergies.     DECUBI-VITE Caps  Take 2 capsules by mouth daily.     glucose blood test strip  Commonly known as:  ONETOUCH VERIO  Use as instructed to check blood sugar 4 times per day dx code E13.10     insulin aspart 100 UNIT/ML injection  Commonly known as:  novoLOG  Inject 0-9 Units into the skin 3 (three) times daily with meals. Sliding scale CBG 70 - 120: 0 units CBG 121 - 150: 1 unit,  CBG 151 - 200: 2 units,  CBG 201 - 250: 3 units,  CBG 251 - 300: 5 units,  CBG 301 - 350: 7 units,  CBG 351 - 400: 9 units   CBG > 400: 9 units and notify your MD     insulin glargine 100 UNIT/ML injection  Commonly known as:  LANTUS  Inject 0.03 mLs (3 Units total) into the skin  daily.     lidocaine-prilocaine cream  Commonly known as:  EMLA  Apply 1 application topically Every Tuesday,Thursday,and Saturday with dialysis.     ONETOUCH DELICA LANCETS FINE Misc  Use to check blood sugar 4 times per day     oxyCODONE 5 MG immediate release  tablet  Commonly known as:  Oxy IR/ROXICODONE  Take 1-2 tablets (5-10 mg total) by mouth every 4 (four) hours as needed for moderate pain, severe pain or breakthrough pain.     pantoprazole 40 MG tablet  Commonly known as:  PROTONIX  Take 1 tablet (40 mg total) by mouth daily.     pravastatin 20 MG tablet  Commonly known as:  PRAVACHOL  Take 20 mg by mouth every evening.     promethazine 25 MG tablet  Commonly known as:  PHENERGAN  Take 25 mg by mouth every 6 (six) hours as needed for nausea or vomiting.     senna 8.6 MG Tabs tablet  Commonly known as:  SENOKOT  Take 1 tablet by mouth 2 (two) times daily.     sevelamer carbonate 800 MG tablet  Commonly known as:  RENVELA  Take 1 tablet (800 mg total) by mouth 3 (three) times daily with meals.           Follow-up Information    Follow up with Juline Sanderford C., MD. Schedule an appointment as soon as possible for a visit in 2 weeks.   Specialty:  General Surgery   Why:  To follow up after your operation, To follow up after your hospital stay   Contact information:   [address] [place] [address] [phone]        Signed: Morton Peters, M.D., F.A.C.S. Gastrointestinal and Minimally Invasive Surgery [facility] [address] [place], [place] [address] [phone] [address] / Paging   03/11/2015, 8:34 AM

## 2015-03-11 NOTE — Progress Notes (Signed)
CBG 61 at 1200 Orange juice given CBG 87

## 2015-03-11 NOTE — Progress Notes (Addendum)
Calorie Count Note  48 hour calorie completed. RD also consulted for diet education on pureed diet. Per GI notes, plan is to discharge on pureed diet for 1-2 weeks and then advance diet slowly. Note that pt was on a mechanical soft diet with ground meats PTA.   Intake has been very poor. Pt refused Nepro supplement. She only consumed 25% of orange juice on her breakfast tray this morning. Noted multiple food items (yogurt, Nepro shake, applesauce, grits) were unattempted.  Attempted to arouse pt, however, pt continued to sleep when her name was being called. Left "Dysphagia Level 1: Pureed" handout from [facility] Nutrition Care Manual at bedside with RD contact information.  Diet:  Dysphagia 1 Supplements: Nepro Shake po TID, each supplement provides 425 kcal and 19 grams protein  Day 1 Breakfast: 195 kcals, 3 grams protein Lunch: 389 kcals, 3 grams protein Dinner: 299 kcals, 4 grams protein Supplements: Refused Prostat, Boost Breeze BID (500 kcals and 18 grams protein  Day 2 Breakfast: 195 kcals, 3 grams protein Lunch: 405 kcals, 32 grams protein (if consumed 100%) Dinner: 14 kcals, 0 grams protein Supplements: 30 ml Prostat (1 refused), Boost Breeze BID (600 kcals, 33 grams protein)  Average Total intake: 929 kcal (71% of minimum estimated needs)  27 protein (49% of minimum estimated needs)  Nutrition Dx: Malnutrition related to chronic illness as evidenced by severe depletion of body fat, severe depletion of muscle mass; ongoing  Goal: Patient will meet greater than or equal to 90% of their needs; unmet  Intervention:  -Continue Nepro Shake po TID, each supplement provides 425 kcal and 19 grams protein -RD will follow-up to review pureed diet guidelines on 03/12/15  Acacia Latorre A. Mayford Knife, RD, LDN, CDE Pager: [phone] After hours Pager: [phone]

## 2015-03-11 NOTE — Progress Notes (Signed)
[facility] Progress Note  Assessment/Plan: 1. SP achalasia surgery (myotomy/ partial fundoplication)-1/13  2 ESRD HD K 5.7 high yesterday - repeat with HD on Thursday 3 Vol/ BP= net UF 500 1/17 with post weight 37.8 ; would expect her to lose wt with poor intake here- BPs chronically low at outpt HD unit 4. 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 Anemia= hgb 10.trending down from 13.7 as outpt   ,resume ESA Thursday  Aranesp 59 s/p recent course of Fe for tsat of 18% 6 Hx CVA=- residing in SNF PTA 7 Chron indwelling foley 8DM -BS ok 9 Positive Blood cultures for strep agalactiae on 02/26/15, treated 1/10 with vancomycin 750mg  IV once and ancef 1gm x 2 prior to admission; repeat Urine and BC here 1/16 no growth so far. 10. Protein calorie malnutrition - on D1 (pureed diet) until edema resolves, then soft alb 2.1- has nepro suppl and vitamin; calorie count in process; very poor intake  Sheffield Slider, PA-C [facility] Beeper [phone] 03/11/2015,8:47 AM  LOS: 5 days   Renal Panel: Diarrhea upsetting to her. For HD in AM [facility]  Subjective:  Intermittent diarrhea.  Some N and V this am. Hasn't eaten any breakfast. Can't tolerate things with fat. No problems with HD yesterday. Too weak to stand    Objective Filed Vitals:   03/10/15 1600 03/10/15 2220 03/10/15 2230 03/11/15 0449  BP: 121/55 88/57 94/62  91/56  Pulse: 101 98  98  Temp: 98 F (36.7 C) 98.2 F (36.8 C)  97.9 F (36.6 C)  TempSrc: Oral Oral    Resp: 18 15  16   Height:      Weight:      SpO2: 96% 100%  99%   Physical Exam General: weak, emaciated Heart: RRR Lungs: grossly clear Abdomen: soft left LQ tenderness Extremities: no sig edema Dialysis Access: left upper AVF + bruit  Dialysis Orders:  [facility] 3.5h F160 350/500 37kg 2//25 bath LUA AVF Heparin 2000 Hect 3 ug;  previously on Mircera 75 in November Hb 13.7 tfs 18% Alb 2.9 ipth 435 P 5-8  Additional Objective Labs: Basic Metabolic Panel:  Recent Labs Lab 03/06/15 0530 03/07/15 0455 03/10/15 0737  NA 139 138 134*  K 4.6 5.5* 5.7*  CL 100* 100* 98*  CO2 26 25 25   GLUCOSE 107* 146* 127*  BUN 37* 41* 44*  CREATININE 3.31* 4.28* 5.62*  CALCIUM 8.2* 8.0* 7.1*  PHOS  --   --  6.5*   Liver Function Tests:  Recent Labs Lab 03/10/15 0737  ALBUMIN 2.1*   Recent Labs Lab 03/06/15 0530 03/07/15 0455 03/10/15 0737  WBC 5.2 9.2 10.3  HGB 11.2* 10.6* 10.3*  HCT 35.7* 33.6* 32.8*  MCV 85.0 85.9 85.6  PLT 190 159 121*   Blood Culture    Component Value Date/Time   SDES BLOOD LEFT ANTECUBITAL 03/09/2015 1907   SPECREQUEST BOTTLES DRAWN AEROBIC AND ANAEROBIC 5CC 03/09/2015 1907   CULT NO GROWTH < 24 HOURS 03/09/2015 1907   REPTSTATUS PENDING 03/09/2015 1907    CBG:  Recent Labs Lab 03/10/15 0717 03/10/15 1246 03/10/15 1700 03/10/15 2136 03/11/15 0740  GLUCAP 80 97 115* 163* 78    Studies/Results: Dg Chest 2 View  03/09/2015  CLINICAL DATA:  Rule out aspiration pneumonia. EXAM: CHEST  2 VIEW COMPARISON:  06/25/2014 FINDINGS: There has been  interval removal of dual lumen central venous catheter. 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 The cardiac silhouette is enlarged. Mediastinal contours appear intact. There is no evidence of pneumothorax. There are bilateral small pleural effusions. Airspace consolidation is seen in the right cardiophrenic angle. Osseous structures are without acute abnormality. IMPRESSION: Enlarged cardiac silhouette. Small bilateral pleural effusions. Airspace opacity in the right cardiophrenic angle, which likely represents focal airspace consolidation. Electronically Signed   By: Ted Mcalpine M.D.   On: 03/09/2015 17:24   Dg Abd 2 Views  03/09/2015  CLINICAL DATA:  Abdominal pain for 4 days, fever EXAM:  ABDOMEN - 2 VIEW COMPARISON:  06/12/2014 FINDINGS: There is normal small bowel gas pattern. Degenerative changes are noted lumbar spine. Contrast material noted throughout the colon from recent esophagram study. IMPRESSION: Normal small bowel gas pattern. Residual contrast material throughout the colon. No colonic distension. Degenerative changes lumbar spine. Electronically Signed   By: Natasha Mead M.D.   On: 03/09/2015 16:07   Medications:   . aspirin EC  81 mg Oral Daily  .  ceFAZolin (ANCEF) IV  1 g Intravenous Q T,Th,Sa-HD  . feeding supplement (NEPRO CARB STEADY)  237 mL Oral TID BM  . heparin subcutaneous  5,000 Units Subcutaneous 3 times per day  . insulin aspart  0-5 Units Subcutaneous QHS  . insulin aspart  0-9 Units Subcutaneous TID WC  . insulin glargine  3 Units Subcutaneous BID  . lidocaine-prilocaine   Topical Q T,Th,Sa-HD  . lip balm  1 application Topical BID  . loratadine  10 mg Oral Daily  . multivitamin with minerals  1 tablet Oral Daily  . pantoprazole  40 mg Oral Daily  . pravastatin  20 mg Oral QPM  . saccharomyces boulardii  250 mg Oral BID  . sevelamer carbonate  800 mg Oral TID WC  . sodium chloride  3 mL Intravenous Q12H

## 2015-03-11 NOTE — Progress Notes (Signed)
[facility]  [address] [place], [place] [address] Phone: [phone] FAX: [facility] [phone] [birth year]   Assessment  Dysphagia - prob tight wrap with retained food improving  Problem List:   Principal Problem:   Achalasia of esophagus s/p Heller/Toupet 03/06/2015 Active Problems:   HLD (hyperlipidemia)   Anemia of chronic disease   ESRD on hemodialysis (HCC)   Protein-calorie malnutrition, severe (HCC)   Neuropathy (HCC)   Cardiomyopathy, ischemic   Moderate 3V CAD at cath 03/24/14   Peripheral artery disease (HCC)   S/P BKA (below knee amputation) unilateral (HCC)   Chronic systolic heart failure (HCC)   DM (diabetes mellitus) type II controlled with renal manifestation (HCC)   Pressure ulcer   5 Days Post-Op  03/06/2015  Procedure(s): XI ROBOTIC HELLER ESOPHAGOCARDIOMYOTOMY AND PARTIAL TOUPET FUNDOPLICATION  UPPER GI ENDOSCOPY    Plan:  -pureed diet for next week until edema resolves, then proabable soft diet -fever of unknown origin.  CXR with mild RLL opacification but no cough, hypoxia, wheezing nor inc WBC = doubt PNA - follow.  F/u urine C&S ? Need for foley?  Had +BC from 2 weeks ago - getting ancef a little longer. -diarrhea - check stool studies.  Awaiting sample.  Less BMs with holding Miralax & PRN laxative.    -cal counts - remind RN/CNAs to do -ESRD - HD per nephrology -skin care -bowel regimen - probitioc for now -stays in bed mostly - baseline per PT -VTE prophylaxis- SCDs, etc -mobilize as tolerated to help recovery -Dispo - [facility] when better  D/C patient from hospital when patient meets criteria (anticipate in 0-1 day(s)):  Tolerating oral intake well (pureed) Transferring well Adequate pain control without IV medications Urinating  Having flatus Disposition planning in place   Adin Hector, M.D., F.A.C.S. Gastrointestinal and Minimally Invasive  Surgery [facility] [address], [place], [place] [address] [phone] Main / Paging   03/11/2015  Subjective:  Mild soreness with deep breaths stable - no cough.  Not SOB No more spitting up  Objective:  Vital signs:  Filed Vitals:   03/10/15 1600 03/10/15 2220 03/10/15 2230 03/11/15 0449  BP: 121/55 88/57 94/62  91/56  Pulse: 101 98  98  Temp: 98 F (36.7 C) 98.2 F (36.8 C)  97.9 F (36.6 C)  TempSrc: Oral Oral    Resp: 18 15  16   Height:      Weight:      SpO2: 96% 100%  99%    Last BM Date: 03/09/15  Intake/Output   Yesterday:  01/17 0701 - 01/18 0700 In: -  Out: 625 [Urine:125] This shift:  Total I/O In: -  Out: 125 [Urine:125]  Bowel function:  Flatus: y  BM: loose  Drain: n/a  Physical Exam:  General: Pt awake/alert/oriented x4 in no acute distress Eyes: PERRL, normal EOM.  Sclera clear.  No icterus Neuro: CN II-XII intact w/o focal sensory/motor deficits. Lymph: No head/neck/groin lymphadenopathy Psych:  No delerium/psychosis/paranoia HENT: Normocephalic, Mucus membranes moist.  No thrush Neck: Supple, No tracheal deviation Chest: No chest wall pain w good excursion.  Lungs clear w mild dec BS at bases.  Mild lateral chest wall soreness CV:  Pulses intact.  Regular rhythm MS: Normal AROM mjr joints.  No obvious deformity Abdomen: Soft.  Nondistended.  Nontender at incisions.  No evidence of peritonitis.  No incarcerated hernias. Ext:  SCDs BLE.  L AKA stable.  No mjr edema.  No cyanosis Skin: No petechiae / purpura  Results:   Labs: Results for orders placed or performed during the hospital encounter of 03/06/15 (from the past 48 hour(s))  Glucose, capillary     Status: None   Collection Time: 03/09/15  7:17 AM  Result Value Ref Range   Glucose-Capillary 98 65 - 99 mg/dL  Glucose, capillary     Status: None   Collection Time: 03/09/15  8:02 AM  Result Value Ref Range   Glucose-Capillary 83 65 -  99 mg/dL   Comment 1 Notify RN   Glucose, capillary     Status: None   Collection Time: 03/09/15 12:19 PM  Result Value Ref Range   Glucose-Capillary 83 65 - 99 mg/dL   Comment 1 Notify RN   Glucose, capillary     Status: Abnormal   Collection Time: 03/09/15  2:50 PM  Result Value Ref Range   Glucose-Capillary 130 (H) 65 - 99 mg/dL   Comment 1 Notify RN   Glucose, capillary     Status: Abnormal   Collection Time: 03/09/15  5:27 PM  Result Value Ref Range   Glucose-Capillary 116 (H) 65 - 99 mg/dL   Comment 1 Notify RN   Urinalysis, Routine w reflex microscopic (not at [facility])     Status: Abnormal   Collection Time: 03/09/15  6:47 PM  Result Value Ref Range   Color, Urine YELLOW YELLOW   APPearance CLOUDY (A) CLEAR   Specific Gravity, Urine 1.016 1.005 - 1.030   pH 7.0 5.0 - 8.0   Glucose, UA NEGATIVE NEGATIVE mg/dL   Hgb urine dipstick MODERATE (A) NEGATIVE   Bilirubin Urine NEGATIVE NEGATIVE   Ketones, ur 15 (A) NEGATIVE mg/dL   Protein, ur 100 (A) NEGATIVE mg/dL   Nitrite NEGATIVE NEGATIVE   Leukocytes, UA LARGE (A) NEGATIVE  Urine microscopic-add on     Status: Abnormal   Collection Time: 03/09/15  6:47 PM  Result Value Ref Range   Squamous Epithelial / LPF 6-30 (A) NONE SEEN   WBC, UA TOO NUMEROUS TO COUNT 0 - 5 WBC/hpf   RBC / HPF 0-5 0 - 5 RBC/hpf   Bacteria, UA FEW (A) NONE SEEN   Urine-Other MUCOUS PRESENT     Comment: YEAST PRESENT  Urine culture     Status: None (Preliminary result)   Collection Time: 03/09/15  6:48 PM  Result Value Ref Range   Specimen Description URINE, CATHETERIZED    Special Requests Normal    Culture TOO YOUNG TO READ    Report Status PENDING   Culture, blood (Routine X 2) w Reflex to ID Panel     Status: None (Preliminary result)   Collection Time: 03/09/15  7:00 PM  Result Value Ref Range   Specimen Description BLOOD LEFT HAND    Special Requests IN PEDIATRIC BOTTLE 3CC    Culture NO GROWTH < 24 HOURS    Report Status PENDING    Culture, blood (Routine X 2) w Reflex to ID Panel     Status: None (Preliminary result)   Collection Time: 03/09/15  7:07 PM  Result Value Ref Range   Specimen Description BLOOD LEFT ANTECUBITAL    Special Requests BOTTLES DRAWN AEROBIC AND ANAEROBIC 5CC    Culture NO GROWTH < 24 HOURS    Report Status PENDING   Glucose, capillary     Status: Abnormal   Collection Time: 03/09/15  9:56 PM  Result Value Ref  Range   Glucose-Capillary 116 (H) 65 - 99 mg/dL   Comment 1 Notify RN   Glucose, capillary     Status: None   Collection Time: 03/10/15  7:17 AM  Result Value Ref Range   Glucose-Capillary 80 65 - 99 mg/dL  CBC     Status: Abnormal   Collection Time: 03/10/15  7:37 AM  Result Value Ref Range   WBC 10.3 4.0 - 10.5 K/uL   RBC 3.83 (L) 3.87 - 5.11 MIL/uL   Hemoglobin 10.3 (L) 12.0 - 15.0 g/dL   HCT 32.8 (L) 36.0 - 46.0 %   MCV 85.6 78.0 - 100.0 fL   MCH 26.9 26.0 - 34.0 pg   MCHC 31.4 30.0 - 36.0 g/dL   RDW 19.1 (H) 11.5 - 15.5 %   Platelets 121 (L) 150 - 400 K/uL  Renal function panel     Status: Abnormal   Collection Time: 03/10/15  7:37 AM  Result Value Ref Range   Sodium 134 (L) 135 - 145 mmol/L   Potassium 5.7 (H) 3.5 - 5.1 mmol/L   Chloride 98 (L) 101 - 111 mmol/L   CO2 25 22 - 32 mmol/L   Glucose, Bld 127 (H) 65 - 99 mg/dL   BUN 44 (H) 6 - 20 mg/dL   Creatinine, Ser 5.62 (H) 0.44 - 1.00 mg/dL   Calcium 7.1 (L) 8.9 - 10.3 mg/dL   Phosphorus 6.5 (H) 2.5 - 4.6 mg/dL   Albumin 2.1 (L) 3.5 - 5.0 g/dL   GFR calc non Af Amer 7 (L) >60 mL/min   GFR calc Af Amer 8 (L) >60 mL/min    Comment: (NOTE) The eGFR has been calculated using the CKD EPI equation. This calculation has not been validated in all clinical situations. eGFR's persistently <60 mL/min signify possible Chronic Kidney Disease.    Anion gap 11 5 - 15  Glucose, capillary     Status: None   Collection Time: 03/10/15 12:46 PM  Result Value Ref Range   Glucose-Capillary 97 65 - 99 mg/dL  Glucose, capillary      Status: Abnormal   Collection Time: 03/10/15  5:00 PM  Result Value Ref Range   Glucose-Capillary 115 (H) 65 - 99 mg/dL  Glucose, capillary     Status: Abnormal   Collection Time: 03/10/15  9:36 PM  Result Value Ref Range   Glucose-Capillary 163 (H) 65 - 99 mg/dL    Imaging / Studies: Dg Chest 2 View  03/09/2015  CLINICAL DATA:  Rule out aspiration pneumonia. EXAM: CHEST  2 VIEW COMPARISON:  06/25/2014 FINDINGS: There has been interval removal of dual lumen central venous catheter. 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 The cardiac silhouette is enlarged. Mediastinal contours appear intact. There is no evidence of pneumothorax. There are bilateral small pleural effusions. Airspace consolidation is seen in the right cardiophrenic angle. Osseous structures are without acute abnormality. IMPRESSION: Enlarged cardiac silhouette. Small bilateral pleural effusions. Airspace opacity in the right cardiophrenic angle, which likely represents focal airspace consolidation. Electronically Signed   By: Fidela Salisbury M.D.   On: 03/09/2015 17:24   Dg Abd 2 Views  03/09/2015  CLINICAL DATA:  Abdominal pain for 4 days, fever EXAM: ABDOMEN - 2 VIEW COMPARISON:  06/12/2014 FINDINGS: There is normal small bowel gas pattern. Degenerative changes are noted lumbar spine. Contrast material noted throughout the colon from recent esophagram study. IMPRESSION: Normal small bowel gas pattern. Residual contrast material throughout the colon.  No colonic distension. Degenerative changes lumbar spine. Electronically Signed   By: Lahoma Crocker M.D.   On: 03/09/2015 16:07    Medications / Allergies: per chart  Antibiotics: Anti-infectives    Start     Dose/Rate Route Frequency Ordered Stop   03/10/15 1200  ceFAZolin (ANCEF) IVPB 1 g/50 mL premix    Comments:  Ends on sat  Jan 26 ,2016  For outpt BC positive from [facility]  See hard copy in chart   1 g 100 mL/hr over 30 Minutes  Intravenous Every T-Th-Sa (Hemodialysis) 03/09/15 2353 03/21/15 1159   03/06/15 0514  cefTRIAXone (ROCEPHIN) 2 g in dextrose 5 % 50 mL IVPB     2 g 100 mL/hr over 30 Minutes Intravenous On call to O.R. 03/06/15 0141 03/06/15 0810   03/06/15 0514  metroNIDAZOLE (FLAGYL) IVPB 500 mg     500 mg 100 mL/hr over 60 Minutes Intravenous On call to O.R. 03/06/15 0514 03/06/15 0755        Note: Portions of this report may have been transcribed using voice recognition software. Every effort was made to ensure accuracy; however, inadvertent computerized transcription errors may be present.   Any transcriptional errors that result from this process are unintentional.     Adin Hector, M.D., F.A.C.S. Gastrointestinal and Minimally Invasive Surgery [facility] [address], [place] [place], [place] [address] [phone] Main / Paging   03/11/2015  CARE TEAM:  PCP: Gildardo Cranker, DO  Outpatient Care Team: Patient Care Team: Gildardo Cranker, DO as PCP - General (Internal Medicine) Fleet Contras, MD as Consulting Physician (Nephrology) Pixie Casino, MD as Consulting Physician (Cardiology) Carol Ada, MD as Consulting Physician (Gastroenterology) Michael Boston, MD as Consulting Physician (General Surgery) Conrad Des Peres, MD as Consulting Physician (Vascular Surgery) Gerlene Fee, NP as Nurse Practitioner (Geriatric Medicine) [facility] ([place])  Inpatient Treatment Team: Treatment Team: Attending Provider: Michael Boston, MD; Consulting Physician: Roney Jaffe, MD; Registered Nurse: Willa Frater, RN; Registered Nurse: Ainsley Spinner, RN; Physician Assistant: Alric Seton, PA-C; Technician: Paula Compton, NT; Registered Nurse: Jacqulyn Cane Rasing, RN; Registered Nurse: Candida Peeling, RN; Technician: Jackalyn Lombard, NT; Technician: Shelda Jakes, NT

## 2015-03-12 ENCOUNTER — Encounter (HOSPITAL_COMMUNITY): Payer: Self-pay | Admitting: Surgery

## 2015-03-12 ENCOUNTER — Encounter: Payer: Self-pay | Admitting: Adult Health

## 2015-03-12 DIAGNOSIS — B3749 Other urogenital candidiasis: Secondary | ICD-10-CM | POA: Diagnosis present

## 2015-03-12 LAB — CBC
HCT: 31.3 % — ABNORMAL LOW (ref 36.0–46.0)
Hemoglobin: 9.8 g/dL — ABNORMAL LOW (ref 12.0–15.0)
MCH: 26.6 pg (ref 26.0–34.0)
MCHC: 31.3 g/dL (ref 30.0–36.0)
MCV: 85.1 fL (ref 78.0–100.0)
Platelets: 144 10*3/uL — ABNORMAL LOW (ref 150–400)
RBC: 3.68 MIL/uL — ABNORMAL LOW (ref 3.87–5.11)
RDW: 18.7 % — ABNORMAL HIGH (ref 11.5–15.5)
WBC: 5.3 10*3/uL (ref 4.0–10.5)

## 2015-03-12 LAB — GLUCOSE, CAPILLARY
GLUCOSE-CAPILLARY: 106 mg/dL — AB (ref 65–99)
GLUCOSE-CAPILLARY: 123 mg/dL — AB (ref 65–99)
GLUCOSE-CAPILLARY: 77 mg/dL (ref 65–99)
Glucose-Capillary: 187 mg/dL — ABNORMAL HIGH (ref 65–99)
Glucose-Capillary: 142 mg/dL — ABNORMAL HIGH (ref 65–99)
Glucose-Capillary: 118 mg/dL — ABNORMAL HIGH (ref 65–99)
Glucose-Capillary: 147 mg/dL — ABNORMAL HIGH (ref 65–99)

## 2015-03-12 LAB — RENAL FUNCTION PANEL
Albumin: 2 g/dL — ABNORMAL LOW (ref 3.5–5.0)
Anion gap: 12 (ref 5–15)
BUN: 28 mg/dL — ABNORMAL HIGH (ref 6–20)
CO2: 24 mmol/L (ref 22–32)
Calcium: 7.2 mg/dL — ABNORMAL LOW (ref 8.9–10.3)
Chloride: 94 mmol/L — ABNORMAL LOW (ref 101–111)
Creatinine, Ser: 4.17 mg/dL — ABNORMAL HIGH (ref 0.44–1.00)
GFR calc Af Amer: 11 mL/min — ABNORMAL LOW (ref >60)
GFR calc non Af Amer: 10 mL/min — ABNORMAL LOW (ref >60)
Glucose, Bld: 144 mg/dL — ABNORMAL HIGH (ref 65–99)
Phosphorus: 3.7 mg/dL (ref 2.5–4.6)
Potassium: 4.5 mmol/L (ref 3.5–5.1)
Sodium: 130 mmol/L — ABNORMAL LOW (ref 135–145)

## 2015-03-12 MED ORDER — DARBEPOETIN ALFA 60 MCG/0.3ML IJ SOSY
PREFILLED_SYRINGE | INTRAMUSCULAR | Status: AC
  Filled 2015-03-12: qty 0.3

## 2015-03-12 MED ORDER — PSYLLIUM 95 % PO PACK
1.0000 | PACK | Freq: Every day | ORAL | Status: DC
  Filled 2015-03-12 (×2): qty 1

## 2015-03-12 MED ORDER — FLUCONAZOLE 10 MG/ML PO SUSR
200.0000 mg | Freq: Once | ORAL | Status: DC
  Filled 2015-03-12: qty 5

## 2015-03-12 MED ORDER — SENNA 8.6 MG PO TABS: 1.0000 | ORAL_TABLET | Freq: Every day | ORAL | Status: DC | PRN

## 2015-03-12 MED ORDER — ALTEPLASE 2 MG IJ SOLR
2.0000 mg | Freq: Once | INTRAMUSCULAR | Status: DC | PRN
  Filled 2015-03-12: qty 2

## 2015-03-12 MED ORDER — LIDOCAINE HCL (PF) 1 % IJ SOLN: 5.0000 mL | INTRAMUSCULAR | Status: DC | PRN

## 2015-03-12 MED ORDER — LOPERAMIDE HCL 2 MG PO CAPS
2.0000 mg | ORAL_CAPSULE | Freq: Every day | ORAL | Status: DC
Start: 2015-03-12 — End: 2015-03-13

## 2015-03-12 MED ORDER — LIDOCAINE-PRILOCAINE 2.5-2.5 % EX CREA
1.0000 "application " | TOPICAL_CREAM | CUTANEOUS | Status: DC | PRN
  Filled 2015-03-12: qty 5

## 2015-03-12 MED ORDER — NYSTATIN 100000 UNIT/GM EX POWD: 1.0000 | Freq: Two times a day (BID) | CUTANEOUS | Status: DC

## 2015-03-12 MED ORDER — PRO-STAT SUGAR FREE PO LIQD: 30.0000 mL | Freq: Every day | ORAL | Status: DC

## 2015-03-12 MED ORDER — NYSTATIN 100000 UNIT/GM EX POWD
Freq: Every day | CUTANEOUS | Status: DC
  Filled 2015-03-12: qty 15

## 2015-03-12 MED ORDER — NEPRO/CARBSTEADY PO LIQD
237.0000 mL | ORAL | Status: DC
  Filled 2015-03-12 (×2): qty 237

## 2015-03-12 MED ORDER — PENTAFLUOROPROP-TETRAFLUOROETH EX AERO: 1.0000 "application " | INHALATION_SPRAY | CUTANEOUS | Status: DC | PRN

## 2015-03-12 MED ORDER — BISMUTH SUBSALICYLATE 262 MG/15ML PO SUSP: 30.0000 mL | Freq: Three times a day (TID) | ORAL | Status: DC | PRN

## 2015-03-12 MED ORDER — HEPARIN SODIUM (PORCINE) 1000 UNIT/ML DIALYSIS
1000.0000 [IU] | INTRAMUSCULAR | Status: DC | PRN
  Filled 2015-03-12: qty 1

## 2015-03-12 MED ORDER — DOXERCALCIFEROL 4 MCG/2ML IV SOLN
INTRAVENOUS | Status: AC
  Filled 2015-03-12: qty 2

## 2015-03-12 MED ORDER — HEPARIN SODIUM (PORCINE) 1000 UNIT/ML DIALYSIS
20.0000 [IU]/kg | INTRAMUSCULAR | Status: DC | PRN
  Filled 2015-03-12: qty 1

## 2015-03-12 MED ORDER — SODIUM CHLORIDE 0.9 % IV SOLN: 100.0000 mL | INTRAVENOUS | Status: DC | PRN

## 2015-03-12 MED ORDER — BOOST / RESOURCE BREEZE PO LIQD: 1.0000 | ORAL | Status: DC

## 2015-03-12 NOTE — Progress Notes (Addendum)
[facility]  [address] [place], [place] [address] Phone: [phone] FAX: [place] [phone] 1945-12-30   Assessment  Dysphagia with chronic achalasia esophageal amotility - improving  Problem List:   Principal Problem:   Achalasia of esophagus s/p Heller/Toupet 03/06/2015 Active Problems:   HLD (hyperlipidemia)   Anemia of chronic disease   ESRD on hemodialysis (HCC)   Protein-calorie malnutrition, severe (HCC)   Neuropathy (HCC)   Cardiomyopathy, ischemic   Moderate 3V CAD at cath 03/24/14   Peripheral artery disease (HCC)   S/P BKA (below knee amputation) unilateral (HCC)   Chronic systolic heart failure (HCC)   DM (diabetes mellitus) type II controlled with renal manifestation (HCC)   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  Supp shakes.  Challenge in pt with severe malnutrition & poor appetite  -fever of unknown origin - resolved.  CXR with mild RLL opacification but no cough, hypoxia, wheezing nor inc WBC = doubt PNA - follow.  Had +BC from 2 weeks ago - getting ancef a little longer.  Yeast in urine - switched Foley & fluconazole given  -diarrhea - Cdiff negative Less BMs with holding Miralax & PRN laxative.   Probiotic inpatient & psyllium outpatient  -ESRD - HD per nephrology -Foley - per Nephrology.   H/o recurrent urinary retention   -hypoglycemia - held lantus.  Better.  Per Int Med back at SNF  -skin care -VTE prophylaxis- SCDs, etc -mobilize as tolerated to help recovery.  She stays in bed mostly - baseline per PT  -[facility] when better.  She doesn't love the place "but I'm used to it".  Declines looking at any  other place  D/C patient from hospital back to [facility] Friday AM:  Tolerating oral intake well (pureed) Transferring well Adequate pain control without IV medications Urinating  Having flatus Disposition planning in place   Adin Hector, M.D., F.A.C.S. Gastrointestinal and Minimally Invasive Surgery [facility] [address] [place], [place] [address] [phone] Main / Paging   03/12/2015  Subjective:  Mild soreness Left flank at one port site Not SOB.  No cough No more spitting up.  Tol pureed diet.  Appetite still fair but better Wanting to stay for several more days - prefers the food here  Objective:  Vital signs:  Filed Vitals:   03/12/15 0930 03/12/15 1000 03/12/15 1030 03/12/15 1045  BP: 110/65 104/63 108/60 111/62  Pulse: 88 93 90 88  Temp:    97.5 F (36.4 C)  TempSrc:    Oral  Resp: 18 15 18 18   Height:      Weight:    34 kg (74 lb 15.3 oz)  SpO2:    100%    Last BM Date: 03/11/15  Intake/Output   Yesterday:  01/18 0701 - 01/19 0700 In: 22 [P.O.:580] Out: 125 [Urine:125] This shift:  Total I/O In: 0  Out: 845 [Urine:1; Other:844]  Bowel function:  Flatus: y  BM: less loose  Drain: n/a  Physical Exam:  General: Pt awake/alert/oriented x4 in no acute distress Eyes: PERRL, normal EOM.  Sclera clear.  No icterus Neuro: CN II-XII  intact w/o focal sensory/motor deficits. Lymph: No head/neck/groin lymphadenopathy Psych:  No delerium/psychosis/paranoia.  More alert today HENT: Normocephalic, Mucus membranes moist.  No thrush Neck: Supple, No tracheal deviation Chest: No chest wall pain w good excursion.  Lungs clear w mild dec BS at bases.  Mild lateral chest wall soreness CV:  Pulses intact.  Regular rhythm MS: Normal AROM mjr joints.  No obvious deformity Abdomen: Soft.  Nondistended.  Nontender at incisions.  No evidence of peritonitis.  No incarcerated hernias. GU:  Clean.  Urine clear yellow Ext:   SCDs BLE.  L AKA stable.  No mjr edema.  No cyanosis Skin: No petechiae / purpura  Results:   Labs: Results for orders placed or performed during the hospital encounter of 03/06/15 (from the past 48 hour(s))  Glucose, capillary     Status: None   Collection Time: 03/10/15 12:46 PM  Result Value Ref Range   Glucose-Capillary 97 65 - 99 mg/dL  Glucose, capillary     Status: Abnormal   Collection Time: 03/10/15  5:00 PM  Result Value Ref Range   Glucose-Capillary 115 (H) 65 - 99 mg/dL  Glucose, capillary     Status: Abnormal   Collection Time: 03/10/15  9:36 PM  Result Value Ref Range   Glucose-Capillary 163 (H) 65 - 99 mg/dL  C difficile quick scan w PCR reflex     Status: None   Collection Time: 03/11/15  6:51 AM  Result Value Ref Range   C Diff antigen NEGATIVE NEGATIVE   C Diff toxin NEGATIVE NEGATIVE   C Diff interpretation Negative for toxigenic C. difficile   Glucose, capillary     Status: None   Collection Time: 03/11/15  7:40 AM  Result Value Ref Range   Glucose-Capillary 78 65 - 99 mg/dL   Comment 1 Notify RN   Glucose, capillary     Status: Abnormal   Collection Time: 03/11/15 12:40 PM  Result Value Ref Range   Glucose-Capillary 61 (L) 65 - 99 mg/dL   Comment 1 Notify RN   Glucose, capillary     Status: None   Collection Time: 03/11/15  2:56 PM  Result Value Ref Range   Glucose-Capillary 87 65 - 99 mg/dL  Glucose, capillary     Status: Abnormal   Collection Time: 03/11/15  5:33 PM  Result Value Ref Range   Glucose-Capillary 30 (LL) 65 - 99 mg/dL   Comment 1 Notify RN   Glucose, capillary     Status: Abnormal   Collection Time: 03/11/15  5:59 PM  Result Value Ref Range   Glucose-Capillary 173 (H) 65 - 99 mg/dL  Glucose, capillary     Status: Abnormal   Collection Time: 03/11/15  8:53 PM  Result Value Ref Range   Glucose-Capillary 57 (L) 65 - 99 mg/dL   Comment 1 Notify RN   Glucose, capillary     Status: Abnormal   Collection Time: 03/11/15  9:54 PM   Result Value Ref Range   Glucose-Capillary 169 (H) 65 - 99 mg/dL   Comment 1 Notify RN   Glucose, capillary     Status: Abnormal   Collection Time: 03/12/15  1:12 AM  Result Value Ref Range   Glucose-Capillary 187 (H) 65 - 99 mg/dL  Glucose, capillary     Status: Abnormal   Collection Time: 03/12/15  2:36 AM  Result Value Ref Range   Glucose-Capillary 142 (H) 65 - 99 mg/dL  Renal function panel     Status:  Abnormal   Collection Time: 03/12/15  7:43 AM  Result Value Ref Range   Sodium 130 (L) 135 - 145 mmol/L   Potassium 4.5 3.5 - 5.1 mmol/L   Chloride 94 (L) 101 - 111 mmol/L   CO2 24 22 - 32 mmol/L   Glucose, Bld 144 (H) 65 - 99 mg/dL   BUN 28 (H) 6 - 20 mg/dL   Creatinine, Ser 4.17 (H) 0.44 - 1.00 mg/dL   Calcium 7.2 (L) 8.9 - 10.3 mg/dL   Phosphorus 3.7 2.5 - 4.6 mg/dL   Albumin 2.0 (L) 3.5 - 5.0 g/dL   GFR calc non Af Amer 10 (L) >60 mL/min   GFR calc Af Amer 11 (L) >60 mL/min    Comment: (NOTE) The eGFR has been calculated using the CKD EPI equation. This calculation has not been validated in all clinical situations. eGFR's persistently <60 mL/min signify possible Chronic Kidney Disease.    Anion gap 12 5 - 15  CBC     Status: Abnormal   Collection Time: 03/12/15  7:43 AM  Result Value Ref Range   WBC 5.3 4.0 - 10.5 K/uL   RBC 3.68 (L) 3.87 - 5.11 MIL/uL   Hemoglobin 9.8 (L) 12.0 - 15.0 g/dL   HCT 31.3 (L) 36.0 - 46.0 %   MCV 85.1 78.0 - 100.0 fL   MCH 26.6 26.0 - 34.0 pg   MCHC 31.3 30.0 - 36.0 g/dL   RDW 18.7 (H) 11.5 - 15.5 %   Platelets 144 (L) 150 - 400 K/uL    Imaging / Studies: No results found.  Medications / Allergies: per chart  Antibiotics: Anti-infectives    Start     Dose/Rate Route Frequency Ordered Stop   03/12/15 1200  fluconazole (DIFLUCAN) 10 MG/ML suspension 200 mg     200 mg Oral  Once 03/12/15 0736     03/10/15 1200  ceFAZolin (ANCEF) IVPB 1 g/50 mL premix    Comments:  Ends on sat  Jan 26 ,2016  For outpt BC positive from [facility]  See  hard copy in chart   1 g 100 mL/hr over 30 Minutes Intravenous Every T-Th-Sa (Hemodialysis) 03/09/15 2353 03/21/15 1159   03/06/15 0514  cefTRIAXone (ROCEPHIN) 2 g in dextrose 5 % 50 mL IVPB     2 g 100 mL/hr over 30 Minutes Intravenous On call to O.R. 03/06/15 7169 03/06/15 0810   03/06/15 0514  metroNIDAZOLE (FLAGYL) IVPB 500 mg     500 mg 100 mL/hr over 60 Minutes Intravenous On call to O.R. 03/06/15 0514 03/06/15 0755        Note: Portions of this report may have been transcribed using voice recognition software. Every effort was made to ensure accuracy; however, inadvertent computerized transcription errors may be present.   Any transcriptional errors that result from this process are unintentional.     Adin Hector, M.D., F.A.C.S. Gastrointestinal and Minimally Invasive Surgery [facility] [address], [place], [place] [address] [phone] Main / Paging   03/12/2015  CARE TEAM:  PCP: Gildardo Cranker, DO  Outpatient Care Team: Patient Care Team: Gildardo Cranker, DO as PCP - General (Internal Medicine) Fleet Contras, MD as Consulting Physician (Nephrology) Pixie Casino, MD as Consulting Physician (Cardiology) Carol Ada, MD as Consulting Physician (Gastroenterology) Michael Boston, MD as Consulting Physician (General Surgery) Conrad Treasure, MD as Consulting Physician (Vascular Surgery) Gerlene Fee, NP as Nurse Practitioner (Geriatric Medicine) [facility] ([place])  Inpatient Treatment Team: Treatment Team: Attending Provider: Michael Boston, MD; Consulting Physician: Roney Jaffe, MD; Registered Nurse: Willa Frater, RN; Physician Assistant: Alric Seton, PA-C; Technician: Paula Compton, NT; Registered Nurse: Candida Peeling, RN; Technician: Shelda Jakes, NT; Registered Nurse: Kennith Center, RN; Technician: Lindajo Royal, NT

## 2015-03-12 NOTE — Discharge Instructions (Signed)
EATING AFTER YOUR ESOPHAGEAL SURGERY (Stomach Fundoplication & Achalasia surgery, etc)  After your esophageal surgery, expect some sticking with swallowing over the next 1-2 months.    If food sticks when you eat, it is called "dysphagia".  This is due to swelling around your esophagus at the wrap & hiatal diaphragm repair.  It will gradually ease off over the next few months.  To help you through this temporary phase, we start you out on a pureed (blenderized) diet.  Your first meal in the hospital was thin liquids.   You should have been given a pureed diet by the time you left the hospital.    We ask patients to stay on a pureed diet for the first 2-3 weeks to avoid anything getting "stuck" near your recent surgery.  Don't be alarmed if your ability to swallow doesn't progress according to this plan.  Everyone is different and some diets can advance more or less quickly.     Some BASIC RULES to follow are:  Maintain an upright position whenever eating or drinking.  Take small bites - just a teaspoon size bite at a time.  Eat slowly.  It may also help to eat only one food at a time.  Consider nibbling through smaller, more frequent meals & avoid the urge to eat BIG meals  Do not push through feelings of fullness, nausea, or bloatedness  Do not mix solid foods and liquids in the same mouthful  Try not to "wash foods down" with large gulps of liquids.  Avoid carbonated (bubbly/fizzy) drinks.    Avoid foods that make you feel gassy or bloated.  Start with bland foods first.  Wait on trying greasy, fried, or spicy meals until you are tolerating more bland solids well.  Understand that it will be hard to burp and belch at first.  This gradually improves with time.  Expect to be more gassy/flatulent/bloated initially.  Walking will help your body manage it better.  Consider using medications for bloating that contain simethicone such as  Maalox or Gas-X   Eat in a relaxed  atmosphere & minimize distractions.  Avoid talking while eating.    Do not use straws.  Following each meal, sit in an upright position (90 degree angle) for 60 to 90 minutes.  Going for a short walk can help as well  If food does stick, don't panic.  Try to relax and let the food pass on its own.  Sipping WARM LIQUID such as strong hot black tea can also help slide it down.   Be gradual in changes & use common sense:  -If you easily tolerating a certain "level" of foods, advance to the next level gradually -If you are having trouble swallowing a particular food, then avoid it.   -If food is sticking when you advance your diet, go back to thinner previous diet (the lower LEVEL) for 1-2 days.  LEVEL 1 = PUREED DIET  Do for the first 2 WEEKS AFTER SURGERY  -Foods in this group are pureed or blenderized to a smooth, mashed potato-like consistency.  -If necessary, the pureed foods can keep their shape with the addition of a thickening agent.   -Meat should be pureed to a smooth, pasty consistency.  Hot broth or gravy may be added to the pureed meat, approximately 1 oz. of liquid per 3 oz. serving of meat. -CAUTION:  If any foods do not puree into a smooth consistency, swallowing will be more difficult.  (For  example, nuts or seeds sometimes do not blend well.)  Hot Foods Cold Foods  Pureed scrambled eggs and cheese Pureed cottage cheese  Baby cereals Thickened juices and nectars  Thinned cooked cereals (no lumps) Thickened milk or eggnog  Pureed [place] toast or pancakes Ensure  Mashed potatoes Ice cream  Pureed parsley, au gratin, scalloped potatoes, candied sweet potatoes Fruit or [place] ice, sherbet  Pureed buttered or alfredo noodles Plain yogurt  Pureed vegetables (no corn or peas) Instant breakfast  Pureed soups and creamed soups Smooth pudding, mousse, custard  Pureed scalloped apples Whipped gelatin  Gravies Sugar, syrup, honey, jelly  Sauces, cheese, tomato, barbecue,  white, creamed Cream  Any baby food Creamer  Alcohol in moderation (not beer or champagne) Margarine  Coffee or tea Mayonnaise   Ketchup, mustard   Apple sauce   SAMPLE MENU:  PUREED DIET Breakfast Lunch Dinner   Orange juice, 1/2 cup  Cream of wheat, 1/2 cup  Pineapple juice, 1/2 cup  Pureed [place], barley soup, 3/4 cup  Pureed Hawaiian chicken, 3 oz   Scrambled eggs, mashed or blended with cheese, 1/2 cup  Tea or coffee, 1 cup   Whole milk, 1 cup   Non-dairy creamer, 2 Tbsp.  Mashed potatoes, 1/2 cup  Pureed cooled broccoli, 1/2 cup  Apple sauce, 1/2 cup  Coffee or tea  Mashed potatoes, 1/2 cup  Pureed spinach, 1/2 cup  Frozen yogurt, 1/2 cup  Tea or coffee      LEVEL 2 = SOFT DIET  After your first 2 weeks, you can advance to a soft diet.   Keep on this diet until everything goes down easily.  Hot Foods Cold Foods  White fish Cottage cheese  Stuffed fish Junior baby fruit  Baby food meals Semi thickened juices  Minced soft cooked, scrambled, poached eggs nectars  Souffle & omelets Ripe mashed bananas  Cooked cereals Canned fruit, pineapple sauce, milk  potatoes Milkshake  Buttered or Alfredo noodles Custard  Cooked cooled vegetable Puddings, including tapioca  Sherbet Yogurt  Vegetable soup or alphabet soup Fruit ice, [place] ice  Gravies Whipped gelatin  Sugar, syrup, honey, jelly Junior baby desserts  Sauces:  Cheese, creamed, barbecue, tomato, white Cream  Coffee or tea Margarine   SAMPLE MENU:  LEVEL 2 Breakfast Lunch Dinner   Orange juice, 1/2 cup  Oatmeal, 1/2 cup  Scrambled eggs with cheese, 1/2 cup  Decaffeinated tea, 1 cup  Whole milk, 1 cup  Non-dairy creamer, 2 Tbsp  Pineapple juice, 1/2 cup  Minced beef, 3 oz  Gravy, 2 Tbsp  Mashed potatoes, 1/2 cup  Minced fresh broccoli, 1/2 cup  Applesauce, 1/2 cup  Coffee, 1 cup  [place], barley soup, 3/4 cup  Minced Hawaiian chicken, 3 oz  Mashed potatoes, 1/2  cup  Cooked spinach, 1/2 cup  Frozen yogurt, 1/2 cup  Non-dairy creamer, 2 Tbsp      LEVEL 3 = CHOPPED DIET  -After all the foods in level 2 (soft diet) are passing through well you should advance up to more chopped foods.  -It is still important to cut these foods into small pieces and eat slowly.  Hot Foods Cold Foods  Poultry Cottage cheese  Chopped Swedish meatballs Yogurt  Meat salads (ground or flaked meat) Milk  Flaked fish (tuna) Milkshakes  Poached or scrambled eggs Soft, cold, dry cereal  Souffles and omelets Fruit juices or nectars  Cooked cereals Chopped canned fruit  Chopped [place] toast or pancakes Canned fruit cocktail  Noodles or pasta (no rice) Pudding, mousse, custard  Cooked vegetables (no frozen peas, corn, or mixed vegetables) Green salad  Canned small sweet peas Ice cream  Creamed soup or vegetable soup Fruit ice, [place] ice  Pureed vegetable soup or alphabet soup Non-dairy creamer  Ground scalloped apples Margarine  Gravies Mayonnaise  Sauces:  Cheese, creamed, barbecue, tomato, white Ketchup  Coffee or tea Mustard   SAMPLE MENU:  LEVEL 3 Breakfast Lunch Dinner   Orange juice, 1/2 cup  Oatmeal, 1/2 cup  Scrambled eggs with cheese, 1/2 cup  Decaffeinated tea, 1 cup  Whole milk, 1 cup  Non-dairy creamer, 2 Tbsp  Ketchup, 1 Tbsp  Margarine, 1 tsp  Salt, 1/4 tsp  Sugar, 2 tsp  Pineapple juice, 1/2 cup  Ground beef, 3 oz  Gravy, 2 Tbsp  Mashed potatoes, 1/2 cup  Cooked spinach, 1/2 cup  Applesauce, 1/2 cup  Decaffeinated coffee  Whole milk  Non-dairy creamer, 2 Tbsp  Margarine, 1 tsp  Salt, 1/4 tsp  Pureed [place], barley soup, 3/4 cup  Barbecue chicken, 3 oz  Mashed potatoes, 1/2 cup  Ground fresh broccoli, 1/2 cup  Frozen yogurt, 1/2 cup  Decaffeinated tea, 1 cup  Non-dairy creamer, 2 Tbsp  Margarine, 1 tsp  Salt, 1/4 tsp  Sugar, 1 tsp    LEVEL 4:  REGULAR FOODS  -Foods in this group are soft,  moist, regularly textured foods.   -This level includes meat and breads, which tend to be the hardest things to swallow.   -Eat very slowly, chew well and continue to avoid carbonated drinks. -most people are at this level in 4-6 weeks  Hot Foods Cold Foods  Baked fish or skinned Soft cheeses - cottage cheese  Souffles and omelets Cream cheese  Eggs Yogurt  Stuffed shells Milk  Spaghetti with meat sauce Milkshakes  Cooked cereal Cold dry cereals (no nuts, dried fruit, coconut)  [place] toast or pancakes Crackers  Buttered toast Fruit juices or nectars  Noodles or pasta (no rice) Canned fruit  Potatoes (all types) Ripe bananas  Soft, cooked vegetables (no corn, lima, or baked beans) Peeled, ripe, fresh fruit  Creamed soups or vegetable soup Cakes (no nuts, dried fruit, coconut)  Canned chicken noodle soup Plain doughnuts  Gravies Ice cream  Bacon dressing Pudding, mousse, custard  Sauces:  Cheese, creamed, barbecue, tomato, white Fruit ice, [place] ice, sherbet  Decaffeinated tea or coffee Whipped gelatin  Pork chops Regular gelatin   Canned fruited gelatin molds   Sugar, syrup, honey, jam, jelly   Cream   Non-dairy   Margarine   Oil   Mayonnaise   Ketchup   Mustard   TROUBLESHOOTING IRREGULAR BOWELS  1) Avoid extremes of bowel movements (no bad constipation/diarrhea)  2) Miralax 17gm mixed in 8oz. water or juice-daily. May use BID as needed.  3) Gas-x,Phazyme, etc. as needed for gas & bloating.  4) Soft,bland diet. No spicy,greasy,fried foods.  5) Prilosec over-the-counter as needed  6) May hold gluten/wheat products from diet to see if symptoms improve.  7) May try probiotics (Align, Activa, etc) to help calm the bowels down  7) If symptoms become worse call back immediately.    If you have any questions please call our office at [facility]: [phone].  LAPAROSCOPIC SURGERY: POST OP INSTRUCTIONS  1. DIET: Follow a light bland diet the first 24 hours  after arrival home, such as soup, liquids, crackers, etc.  Be sure to include lots of fluids daily.  Avoid fast food or heavy meals as your are more likely to get nauseated.  Eat a low fat the next few days after surgery.   2. Take your usually prescribed home medications unless otherwise directed. 3. PAIN CONTROL: a. Pain is best controlled by a usual combination of three different methods TOGETHER: i. Ice/Heat ii. Over the counter pain medication iii. Prescription pain medication b. Most patients will experience some swelling and bruising around the incisions.  Ice packs or heating pads (30-60 minutes up to 6 times a day) will help. Use ice for the first few days to help decrease swelling and bruising, then switch to heat to help relax tight/sore spots and speed recovery.  Some people prefer to use ice alone, heat alone, alternating between ice & heat.  Experiment to what works for you.  Swelling and bruising can take several weeks to resolve.   c. It is helpful to take an over-the-counter pain medication regularly for the first few weeks.  Choose one of the following that works best for you: i. Naproxen (Aleve, etc)  Two 220mg  tabs twice a day ii. Ibuprofen (Advil, etc) Three 200mg  tabs four times a day (every meal & bedtime) iii. Acetaminophen (Tylenol, etc) 500-650mg  four times a day (every meal & bedtime) d. A  prescription for pain medication (such as oxycodone, hydrocodone, etc) should be given to you upon discharge.  Take your pain medication as prescribed.  i. If you are having problems/concerns with the prescription medicine (does not control pain, nausea, vomiting, rash, itching, etc), please call us [phone] to see if we need to switch you to a different pain medicine that will work better for you and/or control your side effect better. ii. If you need a refill on your pain medication, please contact your pharmacy.  They will contact our office to request authorization. Prescriptions  will not be filled after 5 pm or on week-ends. 4. Avoid getting constipated.  Between the surgery and the pain medications, it is common to experience some constipation.  Increasing fluid intake and taking a fiber supplement (such as Metamucil, Citrucel, FiberCon, MiraLax, etc) 1-2 times a day regularly will usually help prevent this problem from occurring.  A mild laxative (prune juice, Milk of Magnesia, MiraLax, etc) should be taken according to package directions if there are no bowel movements after 48 hours.   5. Watch out for diarrhea.  If you have many loose bowel movements, simplify your diet to bland foods & liquids for a few days.  Stop any stool softeners and decrease your fiber supplement.  Switching to mild anti-diarrheal medications (Kayopectate, Pepto Bismol) can help.  If this worsens or does not improve, please call us. 6. Wash / shower every day.  You may shower over the dressings as they are waterproof.  Continue to shower over incision(s) after the dressing is off. 7. Remove your waterproof bandages 5 days after surgery.  You may leave the incision open to air.  You may replace a dressing/Band-Aid to cover the incision for comfort if you wish.  8. ACTIVITIES as tolerated:   a. You may resume regular (light) daily activities beginning the next day--such as daily self-care, walking, climbing stairs--gradually increasing activities as tolerated.  If you can walk 30 minutes without difficulty, it is safe to try more intense activity such as jogging, treadmill, bicycling, low-impact aerobics, swimming, etc. b. Save the most intensive and strenuous activity for last such as sit-ups, heavy lifting, contact sports, etc  Refrain  from any heavy lifting or straining until you are off narcotics for pain control.   c. DO NOT PUSH THROUGH PAIN.  Let pain be your guide: If it hurts to do something, don't do it.  Pain is your body warning you to avoid that activity for another week until the pain goes  down. d. You may drive when you are no longer taking prescription pain medication, you can comfortably wear a seatbelt, and you can safely maneuver your car and apply brakes. e. Bonita Quin may have sexual intercourse when it is comfortable.  9. FOLLOW UP in our office a. Please call [facility] at [phone] to set up an appointment to see your surgeon in the office for a follow-up appointment approximately 2-3 weeks after your surgery. b. Make sure that you call for this appointment the day you arrive home to insure a convenient appointment time. 10. IF YOU HAVE DISABILITY OR FAMILY LEAVE FORMS, BRING THEM TO THE OFFICE FOR PROCESSING.  DO NOT GIVE THEM TO YOUR DOCTOR.   WHEN TO CALL us [phone]: 1. Poor pain control 2. Reactions / problems with new medications (rash/itching, nausea, etc)  3. Fever over 101.5 F (38.5 C) 4. Inability to urinate 5. Nausea and/or vomiting 6. Worsening swelling or bruising 7. Continued bleeding from incision. 8. Increased pain, redness, or drainage from the incision   The clinic staff is available to answer your questions during regular business hours (8:30am-5pm).  Please dont hesitate to call and ask to speak to one of our nurses for clinical concerns.   If you have a medical emergency, go to the nearest emergency room or call 911.  A surgeon from [facility] is always on call at the [facility], [place] [address], [place], [place]  [address] ? MAIN: [phone] ? TOLL FREE: [phone] ?  FAX [phone] [email or web address]  Managing Pain  Pain after surgery or related to activity is often due to strain/injury to muscle, tendon, nerves and/or incisions.  This pain is usually short-term and will improve in a few months.   Many people find it helpful to do the following things TOGETHER to help speed the process of healing and to get back to regular activity more quickly:  1. Avoid  heavy physical activity at first a. No lifting greater than 20 pounds at first, then increase to lifting as tolerated over the next few weeks b. Do not push through the pain.  Listen to your body and avoid positions and maneuvers than reproduce the pain.  Wait a few days before trying something more intense c. Walking is okay as tolerated, but go slowly and stop when getting sore.  If you can walk 30 minutes without stopping or pain, you can try more intense activity (running, jogging, aerobics, cycling, swimming, treadmill, sex, sports, weightlifting, etc ) d. Remember: If it hurts to do it, then dont do it!  2. Take Acetaminophen Anti-inflammatory medication i. Acetaminophen 500mg  tabs (Tylenol) 1-2 pills with every meal and just before bedtime (avoid if you have liver problems) ii. Take with food/snack around the clock for 1-2 weeks iii. This helps the muscle and nerve tissues become less irritable and calm down faster  3. Use a Heating pad or Ice/Cold Pack a. 4-6 times a day b. May use warm bath/hottub  or showers  4. Try Gentle Massage and/or Stretching  a. at the area of pain many times a day b. stop if  you feel pain - do not overdo it  Try these steps together to help you body heal faster and avoid making things get worse.  Doing just one of these things may not be enough.    If you are not getting better after two weeks or are noticing you are getting worse, contact our office for further advice; we may need to re-evaluate you & see what other things we can do to help.  GETTING TO GOOD BOWEL HEALTH. Irregular bowel habits such as constipation and diarrhea can lead to many problems over time.  Having one soft bowel movement a day is the most important way to prevent further problems.  The anorectal canal is designed to handle stretching and feces to safely manage our ability to get rid of solid waste (feces, poop, stool) out of our body.  BUT, hard constipated stools can act like  ripping concrete bricks and diarrhea can be a burning fire to this very sensitive area of our body, causing inflamed hemorrhoids, anal fissures, increasing risk is perirectal abscesses, abdominal pain/bloating, an making irritable bowel worse.      The goal: ONE SOFT BOWEL MOVEMENT A DAY!  To have soft, regular bowel movements:   Drink plenty of fluids, consider 4-6 tall glasses of water a day.    Take plenty of fiber.  Fiber is the undigested part of plant food that passes into the colon, acting s natures broom to encourage bowel motility and movement.  Fiber can absorb and hold large amounts of water. This results in a larger, bulkier stool, which is soft and easier to pass. Work gradually over several weeks up to 6 servings a day of fiber (25g a day even more if needed) in the form of: o Vegetables -- Root (potatoes, carrots, turnips), leafy green (lettuce, salad greens, celery, spinach), or cooked high residue (cabbage, broccoli, etc) o Fruit -- Fresh (unpeeled skin & pulp), Dried (prunes, apricots, cherries, etc ),  or stewed ( applesauce)  o Whole grain breads, pasta, etc (whole wheat)  o Bran cereals   Bulking Agents -- This type of water-retaining fiber generally is easily obtained each day by one of the following:  o Psyllium bran -- The psyllium plant is remarkable because its ground seeds can retain so much water. This product is available as Metamucil, Konsyl, Effersyllium, Per Diem Fiber, or the less expensive generic preparation in drug and health food stores. Although labeled a laxative, it really is not a laxative.  o Methylcellulose -- This is another fiber derived from wood which also retains water. It is available as Citrucel. o Polyethylene Glycol - and artificial fiber commonly called Miralax or Glycolax.  It is helpful for people with gassy or bloated feelings with regular fiber o Flax Seed - a less gassy fiber than psyllium  No reading or other relaxing activity while on  the toilet. If bowel movements take longer than 5 minutes, you are too constipated  AVOID CONSTIPATION.  High fiber and water intake usually takes care of this.  Sometimes a laxative is needed to stimulate more frequent bowel movements, but   Laxatives are not a good long-term solution as it can wear the colon out.  They can help jump-start bowels if constipated, but should be relied on constantly without discussing with your doctor o Osmotics (Milk of Magnesia, Fleets phosphosoda, Magnesium citrate, MiraLax, GoLytely) are safer than  o Stimulants (Senokot, Castor Oil, Dulcolax, Ex Lax)    o Avoid taking laxatives for  more than 7 days in a row.   IF SEVERELY CONSTIPATED, try a Bowel Retraining Program: o Do not use laxatives.  o Eat a diet high in roughage, such as bran cereals and leafy vegetables.  o Drink six (6) ounces of prune or apricot juice each morning.  o Eat two (2) large servings of stewed fruit each day.  o Take one (1) heaping tablespoon of a psyllium-based bulking agent twice a day. Use sugar-free sweetener when possible to avoid excessive calories.  o Eat a normal breakfast.  o Set aside 15 minutes after breakfast to sit on the toilet, but do not strain to have a bowel movement.  o If you do not have a bowel movement by the third day, use an enema and repeat the above steps.   Controlling diarrhea o Switch to liquids and simpler foods for a few days to avoid stressing your intestines further. o Avoid dairy products (especially milk & ice cream) for a short time.  The intestines often can lose the ability to digest lactose when stressed. o Avoid foods that cause gassiness or bloating.  Typical foods include beans and other legumes, cabbage, broccoli, and dairy foods.  Every person has some sensitivity to other foods, so listen to our body and avoid those foods that trigger problems for you. o Adding fiber (Citrucel, Metamucil, psyllium, Miralax) gradually can help thicken stools  by absorbing excess fluid and retrain the intestines to act more normally.  Slowly increase the dose over a few weeks.  Too much fiber too soon can backfire and cause cramping & bloating. o Probiotics (such as active yogurt, Align, etc) may help repopulate the intestines and colon with normal bacteria and calm down a sensitive digestive tract.  Most studies show it to be of mild help, though, and such products can be costly. o Medicines: - Bismuth subsalicylate (ex. Kayopectate, Pepto Bismol) every 30 minutes for up to 6 doses can help control diarrhea.  Avoid if pregnant. - Loperamide (Immodium) can slow down diarrhea.  Start with two tablets (4mg  total) first and then try one tablet every 6 hours.  Avoid if you are having fevers or severe pain.  If you are not better or start feeling worse, stop all medicines and call your doctor for advice o Call your doctor if you are getting worse or not better.  Sometimes further testing (cultures, endoscopy, X-ray studies, bloodwork, etc) may be needed to help diagnose and treat the cause of the diarrhea.  TROUBLESHOOTING IRREGULAR BOWELS 1) Avoid extremes of bowel movements (no bad constipation/diarrhea) 2) Miralax 17gm mixed in 8oz. water or juice-daily. May use BID as needed.  3) Gas-x,Phazyme, etc. as needed for gas & bloating.  4) Soft,bland diet. No spicy,greasy,fried foods.  5) Prilosec over-the-counter as needed  6) May hold gluten/wheat products from diet to see if symptoms improve.  7)  May try probiotics (Align, Activa, etc) to help calm the bowels down 7) If symptoms become worse call back immediately.

## 2015-03-12 NOTE — Progress Notes (Signed)
Nutrition Follow-up  DOCUMENTATION CODES:   Underweight, Severe malnutrition in context of chronic illness  INTERVENTION:   -Decrease Nepro Shake to daily -Boost Breeze po daily, each supplement provides 250 kcal and 9 grams of protein -30 ml Prostat daily, each supplement provides 100 kcals and 15 grams protein -Consider adding appetite stimulant or nutrition support if pt's appetite continues to be poor. Recommend:  Initiate Nepro @ 20 ml/hr via NGT and increase by 10 ml every 12 hours to goal rate of 35 ml/hr.   Tube feeding regimen provides 1512 kcal (100% of needs), 68 grams of protein, and 676 ml of H2O.   NUTRITION DIAGNOSIS:   Malnutrition related to chronic illness as evidenced by severe depletion of body fat, severe depletion of muscle mass.  Ongoing  GOAL:   Patient will meet greater than or equal to 90% of their needs  Unmet  MONITOR:   PO intake, Supplement acceptance, Diet advancement, Labs, Weight trends, Skin, I & O's  REASON FOR ASSESSMENT:   Consult Diet education  ASSESSMENT:   The patient is a 70 year old female. Patient sent for surgical consultation by Dr. Jeani Hawking for concern of worsening achalasia.  S/p Procedure(s) 03/06/15: XI ROBOTIC HELLER CARDIOMYOTOMY AND PARTIAL TOUPET FUNDOPLICATION EGD (N/A)   Pt out of room at time of visit.   Calorie count completed on 03/11/15. Intake continues to be minimal; pt consuming 0-25% of meals, per doc flowsheets. She is also refusing supplements. Noted multiple unopened cans of Nepro in room. Noted hypoglycemic events in chart; being treated with orange juice and dextrose.   Handouts on pureed diet with RD contact information remain at bedside. Per GI note, plan is to discharge on pureed diet for 1-2 weeks and then advance diet slowly. Note that pt was on a mechanical soft diet with ground meats PTA.   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 She confirms  she is followed by outpatient wound center. COWRN ordered air mattress to help assist with healing.   CSW following. Plan to d/c back to SNF one medically stable.   Labs reviewed: Na: 130, CBGS: 57-187. K and Phos WDL.   Diet Order:  Diet - low sodium heart healthy DIET - DYS 1 Room service appropriate?: Yes; Fluid consistency:: Thin  Skin:  Wound (see comment) (stage IV sacrum, closed abdominal incision)  Last BM:  03/11/15  Height:   Ht Readings from Last 1 Encounters:  03/07/15 5\' 3"  (1.6 m)    Weight:   Wt Readings from Last 1 Encounters:  03/12/15 76 lb 11.5 oz (34.8 kg)    Ideal Body Weight:  48.9 kg  BMI:  Body mass index is 13.59 kg/(m^2).  Estimated Nutritional Needs:   Kcal:  1300-1500  Protein:  55-70 grams  Fluid:  1.0-1.5 L  EDUCATION NEEDS:   Education needs addressed  Keshara Kiger A. Mayford Knife, RD, LDN, CDE Pager: [phone] After hours Pager: [phone]

## 2015-03-12 NOTE — Progress Notes (Signed)
Resident of [facility]- SNF level of care. Plan return to facility when stable.  Bed is available per RN Liaison- [facility]. SW Psychosocial assessment to follow.  Lorri Frederick. Jaci Lazier, [place] [phone]

## 2015-03-12 NOTE — Procedures (Signed)
Tolerating HD. Goal 1500cc. Alert and appropriate. BFR 400cc/min.  Will reduce goal.  Clinically dry.  Hast lost some weight with surgery, decreased intake and diarrhea which is better.  Problem/Plan: 1. 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

## 2015-03-13 LAB — GLUCOSE, CAPILLARY
GLUCOSE-CAPILLARY: 114 mg/dL — AB (ref 65–99)
GLUCOSE-CAPILLARY: 125 mg/dL — AB (ref 65–99)
Glucose-Capillary: 129 mg/dL — ABNORMAL HIGH (ref 65–99)
Glucose-Capillary: 98 mg/dL (ref 65–99)

## 2015-03-13 NOTE — Progress Notes (Signed)
CSW contacted the Pt's grand daughter to let her know that she will be leaving and returning to the [facility].   Pt's grand daughter was aware and appreciative for the update.   Leron Croak LCSWA  [facility] [phone]

## 2015-03-13 NOTE — Clinical Social Work Placement (Signed)
   CLINICAL SOCIAL WORK PLACEMENT  NOTE  Date:  03/13/2015  Patient Details  Name: Elizabeth [name] MRN: [id number] Date of Birth: November 24, 1945  Clinical Social Work is seeking post-discharge placement for this patient at the Skilled  Nursing Facility level of care (*CSW will initial, date and re-position this form in  chart as items are completed):  Yes   Patient/family provided with [place] Clinical Social Work Department's list of facilities offering this level of care within the geographic area requested by the patient (or if unable, by the patient's family).  Yes   Patient/family informed of their freedom to choose among providers that offer the needed level of care, that participate in Medicare, Medicaid or managed care program needed by the patient, have an available bed and are willing to accept the patient.  Yes   Patient/family informed of [place]'s ownership interest in [facility] and [facility], as well as of the fact that they are under no obligation to receive care at these facilities.  PASRR submitted to EDS on       PASRR number received on       Existing PASRR number confirmed on 03/13/15     FL2 transmitted to all facilities in geographic area requested by pt/family on       FL2 transmitted to all facilities within larger geographic area on       Patient informed that his/her managed care company has contracts with or will negotiate with certain facilities, including the following:        No   Patient/family informed of bed offers received.  Patient chooses bed at  [facility] )     Physician recommends and patient chooses bed at      Patient to be transferred to  [facility] ) on 03/13/15.  Patient to be transferred to facility by  [facility])     Patient family notified on 03/13/15 of transfer.  Name of family member notified:  Made on attempt to contact Pt's grand-daughter and will continue to attempt to inform family of transport./d/c.       PHYSICIAN       Additional Comment:    _______________________________________________ Leron Croak 03/13/2015, 11:24 AM

## 2015-03-13 NOTE — Progress Notes (Signed)
Report called to Okey Regal at [facility].

## 2015-03-13 NOTE — Progress Notes (Signed)
Pt to be d/c today to [facility].   Pt and family agreeable. Confirmed plans with facility.  Plan transfer via EMS.    Leron Croak LCSWA  [facility]  [phone]

## 2015-03-13 NOTE — Care Management Important Message (Signed)
Important Message  Patient Details  Name: Elizabeth [name] MRN: [id number] Date of Birth: 11/25/1945   Medicare Important Message Given:  Yes    [name] 03/13/2015, 11:54 AM

## 2015-03-13 NOTE — Progress Notes (Signed)
[facility] Progress Note  Assessment/Plan: 1. SP achalasia surgery (myotomy/ partial fundoplication)-1/13  2.  ESRD on HD [facility]. Next HD 03/14/15 3 Vol/ BP=BPs chronically low at outpt HD unit. HD 03/12/15 Net UF 844 Post 34 Kg.  4. MBD: C Ca 8.8 03/12/15. Continue binders, hectoral 5. Anemia= hgb 9.8. ESA restart 03/12/15. Follow HGB,  6 Hx CVA=- residing in SNF PTA 7 Chronic indwelling foley: No issues 8DM -per primary 9 Positive Blood cultures for strep agalactiae on 02/26/15, treated 1/10 with vancomycin 750mg  IV once and ancef 1gm x 2 prior to admission; repeat Urine and BC here 1/16 no growth so far.  10. Protein calorie malnutrition - on D1 (pureed diet) until edema resolves, then soft alb 2.1- has nepro suppl and vitamin; calorie count in process; very poor intake  Disposition: For DC today. Will have HD in center tomorrow.   Rita H. Brown NP-C 03/13/2015, 10:41 AM  [facility] [phone]  Renal Attending: For discharge today.  As nutrition increases we will need to be sensitive about adjusting EDW upward. Makale Pindell C   Subjective: "Help me get up in the chair" Denies pain/discomfort.    Objective Filed Vitals:   03/12/15 1045 03/12/15 1431 03/12/15 2141 03/13/15 0418  BP: 111/62 106/62 88/50 99/61   Pulse: 88 98 101 121  Temp: 97.5 F (36.4 C) 97.7 F (36.5 C) 98 F (36.7 C) 98.2 F (36.8 C)  TempSrc: Oral Oral Oral Oral  Resp: 18 18 18 18   Height:      Weight: 34 kg (74 lb 15.3 oz)     SpO2: 100% 100% 100% 99%   Physical Exam General: thin, chronically Ill appearing female in NAD Heart: S1, S2, No M/G/R  Lungs: bilateral breath sounds dec in bases CTA A/P Abdomen: soft, tender LUQ No distention. Chronic indwelling foley catheter with cloudy urine Extremities: L AKA No LE edema Dialysis Access: LUA AVF + bruit  Dialysis Orders: [facility] TueThuSat, 3 hrs 30 min, 160NRe Optiflux, BFR 350, DFR Manual 500 mL/min, EDW 37 (kg),  Dialysate 2.0 K, 2.25 Ca, UFR Profile: Profile 4, Sodium Model: Linear, Access: LUA AV Fistula Heparin: 2000 unit Q treatment Hectoral: 3 mcg q TTS    Additional Objective Labs: Basic Metabolic Panel:  Recent Labs Lab 03/07/15 0455 03/10/15 0737 03/12/15 0743  NA 138 134* 130*  K 5.5* 5.7* 4.5  CL 100* 98* 94*  CO2 25 25 24   GLUCOSE 146* 127* 144*  BUN 41* 44* 28*  CREATININE 4.28* 5.62* 4.17*  CALCIUM 8.0* 7.1* 7.2*  PHOS  --  6.5* 3.7   Liver Function Tests:  Recent Labs Lab 03/10/15 0737 03/12/15 0743  ALBUMIN 2.1* 2.0*   No results for input(s): LIPASE, AMYLASE in the last 168 hours. CBC:  Recent Labs Lab 03/07/15 0455 03/10/15 0737 03/12/15 0743  WBC 9.2 10.3 5.3  HGB 10.6* 10.3* 9.8*  HCT 33.6* 32.8* 31.3*  MCV 85.9 85.6 85.1  PLT 159 121* 144*   Blood Culture    Component Value Date/Time   SDES BLOOD LEFT ANTECUBITAL 03/09/2015 1907   SPECREQUEST BOTTLES DRAWN AEROBIC AND ANAEROBIC 5CC 03/09/2015 1907   CULT NO GROWTH 3 DAYS 03/09/2015 1907   REPTSTATUS PENDING 03/09/2015 1907    Cardiac Enzymes: No results for input(s): CKTOTAL, CKMB, CKMBINDEX, TROPONINI in the last 168 hours. CBG:  Recent Labs Lab 03/12/15 1932 03/12/15 2126 03/12/15 2330 03/13/15 0123 03/13/15 0753  GLUCAP 147* 123* 106* 114* 129*   Iron Studies: No  results for input(s): IRON, TIBC, TRANSFERRIN, FERRITIN in the last 72 hours. @ Studies/Results: No results found. Medications:   . aspirin EC  81 mg Oral Daily  .  ceFAZolin (ANCEF) IV  1 g Intravenous Q T,Th,Sa-HD  . darbepoetin (ARANESP) injection - DIALYSIS  60 mcg Intravenous Q Thu-HD  . doxercalciferol  3 mcg Intravenous Q T,Th,Sa-HD  . feeding supplement  1 Container Oral Q24H  . feeding supplement (NEPRO CARB STEADY)  237 mL Oral Q24H  . feeding supplement (PRO-STAT SUGAR FREE 64)  30 mL Oral Daily  . fluconazole  200 mg Oral Once  . heparin subcutaneous  5,000 Units Subcutaneous 3 times per  day  . insulin aspart  0-5 Units Subcutaneous QHS  . insulin aspart  0-9 Units Subcutaneous TID WC  . lidocaine-prilocaine   Topical Q T,Th,Sa-HD  . lip balm  1 application Topical BID  . loperamide  2 mg Oral QHS  . loratadine  10 mg Oral Daily  . multivitamin with minerals  1 tablet Oral Daily  . nystatin   Topical QHS  . pantoprazole  40 mg Oral Daily  . pravastatin  20 mg Oral QPM  . psyllium  1 packet Oral Daily  . saccharomyces boulardii  250 mg Oral BID  . sevelamer carbonate  800 mg Oral TID WC  . sodium chloride  3 mL Intravenous Q12H

## 2015-03-13 NOTE — NC FL2 (Signed)
[place] MEDICAID FL2 LEVEL OF CARE SCREENING TOOL     IDENTIFICATION  Patient Name: Elizabeth [name] Birthdate: 1945/04/18 Sex: female Admission Date (Current Location): 03/06/2015  [facility] and [place] Number:  Producer, television/film/video and Address:  The . [facility], [address] [place], [place] [address]      Provider Number: [id number]  Attending Physician Name and Address:  Karie Soda, MD  Relative Name and Phone Number:       Current Level of Care: Hospital Recommended Level of Care: Skilled Nursing Facility Prior Approval Number:    Date Approved/Denied:   PASRR Number:    Discharge Plan: SNF    Current Diagnoses: Patient Active Problem List   Diagnosis Date Noted  . Yeast UTI 03/12/2015  . Pressure ulcer 03/09/2015  . 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 Venous stenosis of left upper extremity 02/06/2015  . 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 DM (diabetes mellitus) type II controlled with renal manifestation (HCC) 06/30/2014  . Chronic systolic heart failure (HCC)   . S/P BKA (below knee amputation) unilateral (HCC) 05/28/2014  . Osteomyelitis (HCC) 05/09/2014  . Peripheral artery disease (HCC) 04/16/2014  . Moderate 3V CAD at cath 03/24/14   . Cardiomyopathy, ischemic   . LVF 30%   . Neuropathy (HCC) 03/17/2014  . Protein-calorie malnutrition, severe (HCC) 12/27/2013  . Ulcer of left knee (HCC) 12/25/2013  . Diarrhea   . ESRD on hemodialysis (HCC) 10/17/2013  . Left leg weakness 10/17/2013  . Other complications due to renal dialysis device, implant, and graft 10/02/2013  . Azotemia 08/19/2013  . Critical lower limb ischemia 11/16/2012  . HLD (hyperlipidemia) 04/24/2012  . Anemia of chronic disease 04/24/2012    Orientation RESPIRATION BLADDER Height & Weight    Self, Time, Situation, Place  Normal Indwelling catheter (Chronic foley- Hemodialysis pt) 5\' 3"  (160 cm) 74 lbs.   BEHAVIORAL SYMPTOMS/MOOD NEUROLOGICAL BOWEL NUTRITION STATUS      Incontinent Diet (Dysphagia 1)  AMBULATORY STATUS COMMUNICATION OF NEEDS Skin   Extensive Assist Verbally PU Stage and Appropriate Care (Stage 4 sacrum- full thickness tissue loss. Change dressing Q 5 days.)                       Personal Care Assistance Level of Assistance  Dressing, Bathing Bathing Assistance: Maximum assistance   Dressing Assistance: Maximum assistance     Functional Limitations Info             SPECIAL CARE FACTORS FREQUENCY  PT (By licensed PT)     PT Frequency: 5              Contractures Contractures Info: Not present    Additional Factors Info  Code Status, Allergies, Insulin Sliding Scale Code Status Info: Full Allergies Info: NKA   Insulin Sliding Scale Info: 4 times day       Current Medications (03/13/2015):  This is the current hospital active medication list Current Facility-Administered Medications  Medication Dose Route Frequency Provider Last Rate Last Dose  . 0.9 %  sodium chloride infusion  250 mL Intravenous PRN Karie Soda, MD      . acetaminophen (TYLENOL) tablet 325-650 mg  325-650 mg Oral Q6H PRN Karie Soda, MD   650 mg at 03/09/15 1516  . aspirin EC tablet 81 mg  81 mg Oral Daily Karie Soda, MD   81 mg at 03/11/15 1057  . bismuth  subsalicylate (PEPTO BISMOL) 262 MG/15ML suspension 30 mL  30 mL Oral Q8H PRN Karie Soda, MD      . ceFAZolin (ANCEF) IVPB 1 g/50 mL premix  1 g Intravenous Q T,Th,Sa-HD Lenny Pastel, PA-C   1 g at 03/12/15 1015  . Darbepoetin Alfa (ARANESP) injection 60 mcg  60 mcg Intravenous Q Thu-HD Weston Settle, PA-C   60 mcg at 03/12/15 0754  . diphenhydrAMINE (BENADRYL) injection 12.5-25 mg  12.5-25 mg Intravenous Q6H PRN Karie Soda, MD      . doxercalciferol (HECTOROL) injection 3 mcg  3 mcg Intravenous Q T,Th,Sa-HD Weston Settle, PA-C   3 mcg at 03/12/15 0755  . feeding supplement (BOOST / RESOURCE BREEZE) liquid 1  Container  1 Container Oral Q24H Stana Bunting, RD   1 Container at 03/12/15 1715  . feeding supplement (NEPRO CARB STEADY) liquid 237 mL  237 mL Oral Q24H Jenifer A Williams, RD      . feeding supplement (PRO-STAT SUGAR FREE 64) liquid 30 mL  30 mL Oral Daily Jenifer A Williams, RD   30 mL at 03/12/15 1147  . fluconazole (DIFLUCAN) 10 MG/ML suspension 200 mg  200 mg Oral Once Karie Soda, MD   200 mg at 03/12/15 1401  . heparin injection 5,000 Units  5,000 Units Subcutaneous 3 times per day Karie Soda, MD   5,000 Units at 03/12/15 0609  . HYDROmorphone (DILAUDID) injection 0.5-2 mg  0.5-2 mg Intravenous Q2H PRN Karie Soda, MD      . insulin aspart (novoLOG) injection 0-5 Units  0-5 Units Subcutaneous QHS Karie Soda, MD   0 Units at 03/06/15 2200  . insulin aspart (novoLOG) injection 0-9 Units  0-9 Units Subcutaneous TID WC Karie Soda, MD   2 Units at 03/08/15 1309  . lidocaine-prilocaine (EMLA) cream   Topical Q T,Th,Sa-HD Karie Soda, MD      . lip balm (CARMEX) ointment 1 application  1 application Topical BID Karie Soda, MD   1 application at 03/11/15 2200  . loperamide (IMODIUM) capsule 2 mg  2 mg Oral Q12H PRN Karie Soda, MD   2 mg at 03/11/15 1817  . loperamide (IMODIUM) capsule 2 mg  2 mg Oral QHS Karie Soda, MD   2 mg at 03/12/15 2140  . loratadine (CLARITIN) tablet 10 mg  10 mg Oral Daily Karie Soda, MD   10 mg at 03/11/15 1057  . magic mouthwash  15 mL Oral QID PRN Karie Soda, MD      . menthol-cetylpyridinium (CEPACOL) lozenge 3 mg  1 lozenge Oral PRN Karie Soda, MD      . metoCLOPramide (REGLAN) injection 5-10 mg  5-10 mg Intravenous Q6H PRN Karie Soda, MD      . metoprolol (LOPRESSOR) injection 5 mg  5 mg Intravenous Q6H PRN Karie Soda, MD      . multivitamin with minerals tablet 1 tablet  1 tablet Oral Daily Karie Soda, MD   1 tablet at 03/11/15 1058  . nystatin (MYCOSTATIN/NYSTOP) topical powder   Topical QHS Karie Soda, MD      . oxyCODONE (Oxy  IR/ROXICODONE) immediate release tablet 5-10 mg  5-10 mg Oral Q4H PRN Karie Soda, MD      . pantoprazole (PROTONIX) EC tablet 40 mg  40 mg Oral Daily Karie Soda, MD   40 mg at 03/11/15 1057  . phenol (CHLORASEPTIC) mouth spray 2 spray  2 spray Mouth/Throat PRN Karie Soda, MD      . pravastatin (PRAVACHOL)  tablet 20 mg  20 mg Oral QPM Karie Soda, MD   20 mg at 03/11/15 2123  . promethazine (PHENERGAN) tablet 25 mg  25 mg Oral Q6H PRN Karie Soda, MD      . psyllium (HYDROCIL/METAMUCIL) packet 1 packet  1 packet Oral Daily Karie Soda, MD   1 packet at 03/12/15 1402  . saccharomyces boulardii (FLORASTOR) capsule 250 mg  250 mg Oral BID Karie Soda, MD   250 mg at 03/11/15 2122  . sevelamer carbonate (RENVELA) tablet 800 mg  800 mg Oral TID WC Weston Settle, PA-C   800 mg at 03/11/15 1200  . sodium chloride 0.9 % injection 3 mL  3 mL Intravenous Q12H Karie Soda, MD   3 mL at 03/11/15 2123  . sodium chloride 0.9 % injection 3 mL  3 mL Intravenous PRN Karie Soda, MD         Discharge Medications: Please see discharge summary for a list of discharge medications.  Relevant Imaging Results:  Relevant Lab Results:   Additional Information L BKA, Hemodialysis  T, Th Sat       Lovette Cliche T, LCSW

## 2015-03-13 NOTE — Clinical Social Work Note (Signed)
Clinical Social Work Assessment  Patient Details  Name: Elizabeth [name] MRN: [id number] Date of Birth: 10/13/1945  Date of referral:  03/13/15               Reason for consult:  Facility Placement (Return to SNF- [name])                Permission sought to share information with:  Facility Medical sales representative, Family Supports Permission granted to share information::  Yes, Verbal Permission Granted  Name::     Elizabeth [name], Granddaughter Paramedic  Agency::     Relationship::     Contact Information:     Housing/Transportation Living arrangements for Elizabeth past 2 months:  Skilled Building surveyor of Information:  Patient Patient Interpreter Needed:  None Criminal Activity/Legal Involvement Pertinent to Current Situation/Hospitalization:  No - Comment as needed Significant Relationships:  Other Family Members Lives with:  Facility Resident Elizabeth [name]) Do you feel safe going back to [facility] place where you live?  Yes Need for family participation in patient care:  No (Coment)  Care giving concerns:  "I was having trouble breathing; I knew I had to come to [facility].  I have been sick."   Office manager / plan:  70 year old female- resident of [facility]. Patient states that she is fairly satisfied with current care arrangement and indicates that she wants to return there rather than change facilities.  Per MD- patient is medically stable for d/c today. CSW notified Elizabeth [name], Admissions for [facility] [name] who indicated that be is available for patient.  Fl2 completed and placed on chart for MD's signature.  Employment status:  Retired Health and safety inspector:  Medicare PT Recommendations:  No Follow Up Information / Referral to community resources:  Skilled Nursing Facility  Patient/Family's Response to care:  Patient indicates that she is feeling better and is agreeable to return to facility. Denies any questions or concerns.       Patient/Family's Understanding of and Emotional Response to Diagnosis, Current Treatment, and Prognosis:  Patient is alert, oriented and very pleasant. She is able to verbalize a fairly good understanding of her current diagnosis and prognosis; she does not indicate any emotional turmoil or concerns about her care needs.   Emotional Assessment Appearance:  Appears stated age Attitude/Demeanor/Rapport:   (Attitude appropriate for age and situation) Affect (typically observed):   (Affect appropropriate for age and situation) Orientation:  Oriented to Self, Oriented to Place, Oriented to  Time, Oriented to Situation Alcohol / Substance use:  Alcohol Use (Alcohol use by hx. Not current) Psych involvement (Current and /or in [facility] community):  No (Comment)  Discharge Needs  Concerns to be addressed:  Care Coordination Readmission within Elizabeth last 30 days:  No Current discharge risk:  None Barriers to Discharge:  Continued Medical Work up   Elizabeth [name] 03/13/2015, 9:41 PM

## 2015-03-14 LAB — CULTURE, BLOOD (ROUTINE X 2)
Culture: NO GROWTH
Culture: NO GROWTH

## 2015-03-16 ENCOUNTER — Non-Acute Institutional Stay (SKILLED_NURSING_FACILITY): Payer: Medicare Other | Admitting: Adult Health

## 2015-03-16 ENCOUNTER — Encounter: Payer: Self-pay | Admitting: Adult Health

## 2015-03-16 DIAGNOSIS — E785 Hyperlipidemia, unspecified: Secondary | ICD-10-CM

## 2015-03-16 DIAGNOSIS — E43 Unspecified severe protein-calorie malnutrition: Secondary | ICD-10-CM | POA: Diagnosis not present

## 2015-03-16 DIAGNOSIS — I5022 Chronic systolic (congestive) heart failure: Secondary | ICD-10-CM | POA: Diagnosis not present

## 2015-03-16 DIAGNOSIS — Z992 Dependence on renal dialysis: Secondary | ICD-10-CM

## 2015-03-16 DIAGNOSIS — E1169 Type 2 diabetes mellitus with other specified complication: Secondary | ICD-10-CM | POA: Diagnosis not present

## 2015-03-16 DIAGNOSIS — N186 End stage renal disease: Secondary | ICD-10-CM

## 2015-03-16 DIAGNOSIS — Z794 Long term (current) use of insulin: Secondary | ICD-10-CM

## 2015-03-16 DIAGNOSIS — D638 Anemia in other chronic diseases classified elsewhere: Secondary | ICD-10-CM

## 2015-03-16 DIAGNOSIS — E1122 Type 2 diabetes mellitus with diabetic chronic kidney disease: Secondary | ICD-10-CM | POA: Diagnosis not present

## 2015-03-16 DIAGNOSIS — L89154 Pressure ulcer of sacral region, stage 4: Secondary | ICD-10-CM

## 2015-03-16 DIAGNOSIS — L98429 Non-pressure chronic ulcer of back with unspecified severity: Secondary | ICD-10-CM

## 2015-03-16 DIAGNOSIS — K220 Achalasia of cardia: Secondary | ICD-10-CM

## 2015-03-16 NOTE — Progress Notes (Signed)
This encounter was created in error - please disregard.

## 2015-03-16 NOTE — Progress Notes (Signed)
Patient ID: Elizabeth [name], female   DOB: 05/08/1945, 70 y.o.   MRN: [id number]    Facility: [place], [place]    No Known Allergies  Chief Complaint  Patient presents with  . Hospitalization Follow-up    HPI:  She is a long term resident of this facility who has been hospitalized for her achalasia of her esophagus. She did have a Heller/Toupet on 03-06-15. She is on pureed diet; states that she is better able to tolerate her food. She is not voicing any complaints or concerns at this time. There are no nursing concerns at this time.   Past Medical History  Diagnosis Date  . Coronary artery disease, non-occlusive January 2016    Moderate p&mLAD & Ostial OM; Myoview Nuclear Stress Test 03/21/2014: EF 35%. Mild hypokinesis of the inferior wall with fixed inferior defect suggesting scar. (no culprit lesion on CATH)  . 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 Critical lower limb ischemia     Status post left BKA following left popliteal and peroneal artery endarterectomy with patch angioplasty.  . Diabetic neuropathy ([place])   . Hyperlipidemia with target LDL less than 70   . History of CVA (cerebrovascular accident) 08/19/2013    "minor one"  . ESRD (end stage renal disease) ([place])     [address], [place], [place] (06/12/2014)  . Urinary retention     DX NEUROGENIC BLADDER--HAS INDWELLING FOLEY CATHETER  . Foley catheter in place     for urinary retention  . Anemia   . Hematemesis April 2016    Hospitalized  . Arthritis of knee, right     "right knee" (06/12/2014)  . Gout     "was in my LLE"  . Diabetes mellitus, insulin dependent (IDDM), uncontrolled ([place])     [place]; has h/o Hypo & Hyperglycemic episodes, Type II  . Foot infection   . Loss of consciousness 08/19/2013  . UTI  (urinary tract infection) 10/17/2013  . Acute hyperkalemia 12/24/2013    Past Surgical History  Procedure Laterality Date  . Insertion of suprapubic catheter N/A 05/16/2012    Procedure: INSERTION OF SUPRAPUBIC CATHETER;  Surgeon: Alexis Frock, MD;  Location: [facility];  Service: Urology;  Laterality: N/A;  . Bascilic vein transposition Left 10/30/2012    Procedure: LEFT 1ST STAGE BASCILIC VEIN TRANSPOSITION;  Surgeon: Angelia Mould, MD;  Location: [place];  Service: Vascular;  Laterality: Left;  . Nm myocar perf wall motion  04/19/2012/ 02/2014    a) low risk study-EF 44%,mild inferolateral hypkinesis; b) EF 35%. Mild hypokinesis of the inferior wall with fixed inferior defect suggesting scar.  . Bascilic vein transposition Left 02/05/2013    Procedure: BASCILIC VEIN TRANSPOSITION 2ND STAGE;  Surgeon: Angelia Mould, MD;  Location: [place];  Service: Vascular;  Laterality: Left;  . Radiology with anesthesia N/A 10/25/2013    Procedure: RADIOLOGY WITH ANESTHESIA;  Surgeon: Medication Radiologist, MD;  Location: [place];  Service: Radiology;  Laterality: N/A;  . Fistulogram Left 04/29/2013    Procedure: FISTULOGRAM;  Surgeon: Angelia Mould, MD;  Location: [facility] CATH LAB;  Service: Cardiovascular;  Laterality: Left;  . Angioplasty Left 04/29/2013    Procedure: ANGIOPLASTY;  Surgeon: Angelia Mould, MD;  Location: [facility] CATH LAB;  Service: Cardiovascular;  Laterality: Left;  .  Abdominal aortagram N/A 03/19/2014    Procedure: ABDOMINAL Maxcine Ham;  Surgeon: Elam Dutch, MD;  Location: [facility] CATH LAB;  Service: Cardiovascular;  Laterality: N/A;  . Left and right heart catheterization with coronary angiogram N/A 03/24/2014    Procedure: LEFT AND RIGHT HEART CATHETERIZATION WITH CORONARY ANGIOGRAM;  Surgeon: Leonie Man, MD;  Location: [facility] CATH LAB;  Service: Cardiovascular; Moderate disease in p-mLAD & Ostial OM1; normal right heart pressures. Wedge pressure 29 mmHg. --No lesion to explain  inferior defect on Myoview and echocardiogram.   . Endarterectomy popliteal Left 03/26/2014    Procedure: Left popliteal and peroneal artery endarterectomy with vein patch angioplasty;  Surgeon: Elam Dutch, MD;  Location: [place];  Service: Vascular;  Laterality: Left;  . Amputation Left 03/26/2014    Procedure: AMPUTATION SECOND LEFT TOE;  Surgeon: Elam Dutch, MD;  Location: [place];  Service: Vascular;  Laterality: Left;  . Av fistula placement Left 04/28/2014    Procedure: CREATION OF LEFT BRACHIOCEPHALIC  ARTERIOVENOUS (AV) FISTULA CREATION;  Surgeon: Conrad Panama, MD;  Location: [place];  Service: Vascular;  Laterality: Left;  . Amputation Left 05/10/2014    Procedure: AMPUTATION BELOW KNEE;  Surgeon: Rosetta Posner, MD;  Location: [facility] OR;  Service: Vascular;  Laterality: Left;  . Esophagogastroduodenoscopy N/A 06/13/2014    Procedure: ESOPHAGOGASTRODUODENOSCOPY (EGD);  Surgeon: Carol Ada, MD;  Location: [facility] ENDOSCOPY;  Service: Endoscopy;  Laterality: N/A;  . Transthoracic echocardiogram  03/22/2014    EF 30-35%, mild LVH. Severe inferior hypokinesis. Mild MR.  Marland Kitchen Below knee leg amputation Left   . Fistulogram Left 10/08/2014    Procedure: LEFT ARM FISTULOGRAM;  Surgeon: Conrad Stonewall, MD;  Location: [place];  Service: Vascular;  Laterality: Left;  . Venoplasty Left 10/08/2014    Procedure: LEFT CEPHALIC VEIN VENOPLASTY;  Surgeon: Conrad Freer, MD;  Location: [place];  Service: Vascular;  Laterality: Left;  . Incision and drainage of wound N/A 10/29/2014    Procedure: IRRIGATION AND DEBRIDEMENT OF SACRAL WOUND, PLACEMENT OF A-CELL;  Surgeon: Theodoro Kos, DO;  Location: [place];  Service: Plastics;  Laterality: N/A;  . Application of a-cell of extremity N/A 10/29/2014    Procedure: PLACEMENT OF APPLICATION OF A-CELL;  Surgeon: Theodoro Kos, DO;  Location: [place];  Service: Plastics;  Laterality: N/A;  . Esophageal manometry N/A 11/24/2014    Procedure: ESOPHAGEAL MANOMETRY (EM);  Surgeon: Carol Ada,  MD;  [facility];  Service: Endoscopy;  Laterality: N/A;  . Upper gi endoscopy  03/06/2015    Procedure: UPPER GI ENDOSCOPY;  Surgeon: Michael Boston, MD;  Location: [facility];  Service: General;;    VITAL SIGNS BP 99/59 mmHg  Pulse 100  Temp(Src) 97.7 F (36.5 C)  Resp 16  Ht 5' 3"  (1.6 m)  Wt 83 lb (37.649 kg)  BMI 14.71 kg/m2  SpO2 95%  Patient's Medications  New Prescriptions   No medications on file  Previous Medications   ASPIRIN EC 81 MG TABLET    Take 81 mg by mouth daily.   CETIRIZINE (ZYRTEC) 10 MG TABLET    Take 10 mg by mouth daily as needed for allergies.    INSULIN ASPART (NOVOLOG) 100 UNIT/ML INJECTION    Inject 5-8 Units into the skin 3 (three) times daily before meals. 7 units with breakfast; 5 units with lunch; 8 units with supper   INSULIN GLARGINE (LANTUS) 100 UNIT/ML INJECTION    Inject 3 units at bedtime and 6 units  in the AM .   LIDOCAINE-PRILOCAINE (EMLA) CREAM    Apply 1 application topically Every Tuesday,Thursday,and Saturday with dialysis.    MULTIPLE VITAMINS-MINERALS (DECUBI-VITE) CAPS    Take 2 capsules by mouth daily.   OXYCODONE-ACETAMINOPHEN (PERCOCET/ROXICET) 5-325 MG TABLET    Take 1 tablet by mouth every 8 (eight) hours as needed for severe pain.   PANTOPRAZOLE (PROTONIX) 40 MG TABLET    Take 1 tablet (40 mg total) by mouth daily.   PRAVASTATIN (PRAVACHOL) 20 MG TABLET    Take 20 mg by mouth every evening.    PROMETHAZINE (PHENERGAN) 25 MG TABLET    Take 25 mg by mouth every 6 (six) hours as needed for nausea or vomiting.   SENNA (SENOKOT) 8.6 MG TABS TABLET    Take 1 tablet (8.6 mg total) by mouth daily as needed for mild constipation or moderate constipation.   SEVELAMER CARBONATE (RENVELA) 800 MG TABLET    Take 1 tablet (800 mg total) by mouth 3 (three) times daily with meals.  Modified Medications   No medications on file  Discontinued Medications     SIGNIFICANT DIAGNOSTIC EXAMS  03-22-14: 2-d echo: - Left ventricle: The cavity  size was normal. Wall thickness was increased in a pattern of mild LVH. Systolic function was moderately to severely reduced. The estimated ejection fraction was in the range of 30% to 35%. Severe hypokinesis of the inferior myocardium. - Mitral valve: There was mild regurgitation. - Left atrium: The atrium was mildly dilated. - Pulmonary arteries: PA peak pressure: 31 mm Hg (S). - Pericardium, extracardiac: A trivial pericardial effusion was identified. There was a left pleural effusion.  05-04-14: chest x-ray: mild chf with bilateral air space disease and small bilateral pleural effusion   05-09-14: chest x-ray: 1. Stable mild cardiomegaly. 2. Decreased pleural effusions and improved aeration.  05-09-14: left foot x-ray: Acute osteomyelitis involving in the residual 2nd metatarsal, 3rd metatarsal head, and 3rd proximal phalanx.Associated pathologic fracture of the 3rd proximal phalanx.  03-08-15: barium swallow: 1. Narrow but patent gastroesophageal junction. Contrast does flows into the stomach but a large portion of the contrast remains within the patulous esophagus. Recommend caution in advancing diet. 2. No esophageal leak  03-09-15: kub: Normal small bowel gas pattern. Residual contrast material throughout the colon. No colonic distension. Degenerative changes lumbar spine  03-09-15: chest x-ray: Enlarged cardiac silhouette. Small bilateral pleural effusions. Airspace opacity in the right cardiophrenic angle, which likely represents focal airspace consolidation.      LABS REVIEWED:   04-01-14: wbc 12.1; hgb 9.6; hct 34.7; mcv 92.4; plt 434 05-02-14: urine culture: neg  05-09-14: wbc 8.5; hgb 11.;4 hct 37.1; mcv 87.1 plt 252; glucose 299; bun 43; creat 3.49; k+ 3.7; na++135; liver normal albumin 2.1; hgb a1c 7.0; tsh 1.242 05-11-14: wbc 9.7; hgb 10.8; hct 37.1; mcv 90.3; plt 213; glucose 114; bun 24; creat 3.12; k+4.5; na++136  05-12-14: wbc 11.9; hgb 10.4; hct 33.8; mcv 87.3 plt 225; glucose  151; bun 52; creat 4.52; k+5.0; na++135  06-28-14: wbc 5.9; hgb 11.8; hct 37.7; mcv 84.2; plt 103; glucose 86; bun 52; creat 3.74; k+5.5; na++134; albumin 2.2; phos 5.4  08-13-14: wbc 5.7 hgb 12.5 ;hct 40.5; mcv 84.6; plt 153; glucose 126; bun 80; creat 5.22; k+ 5.3; na++134; alk phos 145; ast 60; alt 83; albumin 3.0; hgb a1c 7.7; chol 146; ;ldl 44; trig 74; hdl 40 11-24-14: hgb a1c 7.0   03-06-15: wbc 5.2; hgb 11.2; hct 35.7; mcv 85.0; plt 190;  glucose 107; bun 37; creat 3.31; k+ 4.6; na++139 hgb a1c 7.5  03-09-15: urine culture: yeast; blood culture: no growth 03-11-15: stool for c-diff: neg 03-12-15: wbc 5.3; hgb 9.8; hct 31.1; mcv 85.1; plt 144; glucose 144; bun 28; creat 4.17; k+ 4.5; na++130; phos 3.7; albumin 2.0      Review of Systems Constitutional: Negative for appetite change and fatigue.  HENT: Negative for congestion.   Respiratory: Negative for cough, chest tightness and shortness of breath.   Cardiovascular: Negative for chest pain, palpitations and leg swelling.  Gastrointestinal: negative for nausea, vomiting, diarrhea   Musculoskeletal: Negative for myalgias and arthralgias.  Skin: Negative for pallor.  Neurological: Negative for dizziness.  Psychiatric/Behavioral: The patient is not nervous/anxious.    Physical Exam Constitutional: No distress.  Frail   Eyes: Conjunctivae are normal.  Neck: Neck supple. No JVD present. No thyromegaly present.  Cardiovascular: Normal rate, regular rhythm and intact distal pulses.   Respiratory: Effort normal and breath sounds normal. No respiratory distress. She has no wheezes.  GI: Soft. Bowel sounds are normal. She exhibits no distension. There is no tenderness.    Musculoskeletal: She exhibits no edema.  Able to move all extremities  Left bka   Lymphadenopathy:    She has no cervical adenopathy.  Neurological: She is alert.  Skin: Skin is warm and dry. She is not diaphoretic.  Psychiatric: She has a normal mood and affect.    Sacral wound 0.4 x  0.5 cm being followed by wound clinic Has left upper extremity A/V fistula      ASSESSMENT/ PLAN:  1. ESRD: followed by nephrology; is on hemodialysis three days per week. Is on 1200 cc fluid restriction; will continue renvela 800 mg three times daily will monitor  2. Diabetes; hgb a1c is 7.5 will continue her lantus 6 units in the AM and 3 units nightly; will continue novolog 7 units with breakfast; 5 units with lunch and 8 units with supper. Will monitor  3. achalasia of esophagus status post Heller/Toupet on 03-06-15: will continue protonix 40 mg daily; is current on pureed diet will monitor  4. Dyslipidemia: will continue pravachol 20 mg daily   5. Anemia of chronic disease: hgb is 9.8; will monitor   6. CAD: has 3 vessel disease is status post cath on 03-24-14: no complaint of chest pain present; will continue asa 81 mg daily   7. Chronic systolic heart failure: is presently stable her EF is 30%  8. Stage IV sacral ulceration: will continue current treatment is followed by wound clinic  9. PAD: will continue asa 81 mg daily is status post left bka  10. Protein calorie malnutrition: will continue supplements per facility protocol her current weight is 83 pounds; her albumin is 2.0     Will check cmp and lipids next draw   Time spent with patient 50   minutes >50% time spent counseling; reviewing medical record; tests; labs; and developing future plan of care   Ok Edwards NP [facility]  Contact [phone] Monday through Friday 8am- 5pm  After hours call [phone]

## 2015-03-18 LAB — HEPATIC FUNCTION PANEL
ALK PHOS: 140 U/L — AB (ref 25–125)
ALT: 3 U/L — AB (ref 7–35)
AST: 15 U/L (ref 13–35)

## 2015-03-18 LAB — LIPID PANEL
Cholesterol: 130 mg/dL (ref 0–200)
HDL: 52 mg/dL (ref 35–70)
LDL Cholesterol: 60 mg/dL
Triglycerides: 90 mg/dL (ref 40–160)

## 2015-03-18 LAB — BASIC METABOLIC PANEL
BUN: 22 mg/dL — AB (ref 4–21)
Creatinine: 3.2 mg/dL — AB (ref 0.5–1.1)
Glucose: 66 mg/dL
Potassium: 4.5 mmol/L (ref 3.4–5.3)
Sodium: 137 mmol/L (ref 137–147)

## 2015-03-30 ENCOUNTER — Ambulatory Visit: Payer: Medicare Other | Admitting: Internal Medicine

## 2015-03-31 ENCOUNTER — Encounter: Payer: Self-pay | Admitting: *Deleted

## 2015-04-07 ENCOUNTER — Encounter: Payer: Self-pay | Admitting: Internal Medicine

## 2015-04-07 ENCOUNTER — Non-Acute Institutional Stay (SKILLED_NURSING_FACILITY): Payer: Medicare Other | Admitting: Internal Medicine

## 2015-04-07 DIAGNOSIS — E785 Hyperlipidemia, unspecified: Secondary | ICD-10-CM

## 2015-04-07 DIAGNOSIS — Z992 Dependence on renal dialysis: Secondary | ICD-10-CM

## 2015-04-07 DIAGNOSIS — Z89512 Acquired absence of left leg below knee: Secondary | ICD-10-CM

## 2015-04-07 DIAGNOSIS — I5022 Chronic systolic (congestive) heart failure: Secondary | ICD-10-CM | POA: Diagnosis not present

## 2015-04-07 DIAGNOSIS — L89154 Pressure ulcer of sacral region, stage 4: Secondary | ICD-10-CM

## 2015-04-07 DIAGNOSIS — K220 Achalasia of cardia: Secondary | ICD-10-CM | POA: Diagnosis not present

## 2015-04-07 DIAGNOSIS — I739 Peripheral vascular disease, unspecified: Secondary | ICD-10-CM

## 2015-04-07 DIAGNOSIS — Z794 Long term (current) use of insulin: Secondary | ICD-10-CM

## 2015-04-07 DIAGNOSIS — E43 Unspecified severe protein-calorie malnutrition: Secondary | ICD-10-CM

## 2015-04-07 DIAGNOSIS — E1122 Type 2 diabetes mellitus with diabetic chronic kidney disease: Secondary | ICD-10-CM

## 2015-04-07 DIAGNOSIS — N186 End stage renal disease: Secondary | ICD-10-CM | POA: Diagnosis not present

## 2015-04-07 DIAGNOSIS — E1169 Type 2 diabetes mellitus with other specified complication: Secondary | ICD-10-CM | POA: Diagnosis not present

## 2015-04-07 DIAGNOSIS — D638 Anemia in other chronic diseases classified elsewhere: Secondary | ICD-10-CM | POA: Diagnosis not present

## 2015-04-07 DIAGNOSIS — L98429 Non-pressure chronic ulcer of back with unspecified severity: Secondary | ICD-10-CM

## 2015-04-07 NOTE — Progress Notes (Signed)
Patient ID: Elizabeth [name], female   DOB: 01/26/1946, 70 y.o.   MRN: [id number]    HISTORY AND PHYSICAL   DATE: 04/07/15  Location:  [place] of Service: SNF (31)   Extended Emergency Contact Information Primary Emergency Contact: Gunnar Bulla, [place] of [place] Phone: [phone] Relation: Grandaughter Secondary Emergency Contact: [place] of [place] Mobile Phone: [phone] Relation: Niece  Advanced Directive information  FULL CODE  Chief Complaint  Patient presents with  . Readmit To SNF    HPI:  70 yo female long term resident seen today for readmission into SNF following hospital stay for  She reports AM nausea but no emesis. She takes promethazine prn. Dysphagia has improved but no resolved completely. Appetite is poor. No f/c. No nursing issues. No trouble sleeping.  ESRD - has HD on TThsa via left arm AVG. She is on a 1200 cc fluid restriction. Takes  Renvela. 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 Last A1c 7.5%. CBG 306 yesterday. 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 takes protonix daily. Diet is pureed. Uses prn phenergan  Dyslipidemia - stable on pravachol  Anemia of chronic disease - stable. last hgb is 9.8   CAD - 3 vessel disease. She had a cath on 03-24-14. No CP at this time. Takes asa daily  Chronic systolic heart failure - stable. 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 Takes percocet prn for pain and decubi-vite  PAD - stable on ASA daily. She is s/p left bka  Protein calorie malnutrition - stable. She is getting supplements per facility protocol.  her current weight is 83 pounds; her albumin is 2.0  She also takes prn zyrtec for seasonal allergy  Past Medical History  Diagnosis Date  . Coronary artery disease, non-occlusive January 2016    Moderate  p&mLAD & Ostial OM; Myoview Nuclear Stress Test 03/21/2014: EF 35%. Mild hypokinesis of the inferior wall with fixed inferior defect suggesting scar. (no culprit lesion on CATH)  . Peripheral vascular disease of lower extremity with ulceration ([place]) January-March 2016    PV Angiogram 03/19/2014: Bilateral AP and PT artery occlusions, 90% Left peroneal artery with single-vessel Right peroneal and flow  . Critical lower limb ischemia     Status post left BKA following left popliteal and peroneal artery endarterectomy with patch angioplasty.  . Diabetic neuropathy ([place])   . Hyperlipidemia with target LDL less than 70   . History of CVA (cerebrovascular accident) 08/19/2013    "minor one"  . ESRD (end stage renal disease) ([place])     [address], [place], S (06/12/2014)  . Urinary retention     DX NEUROGENIC BLADDER--HAS INDWELLING FOLEY CATHETER  . Foley catheter in place     for urinary retention  . Anemia   . Hematemesis April 2016    Hospitalized  . Arthritis of knee, right     "right knee" (06/12/2014)  . Gout     "was in my LLE"  . Diabetes mellitus, insulin dependent (IDDM), uncontrolled ([place])     Brittle; has h/o Hypo & Hyperglycemic episodes, Type II  . Foot infection   . Loss of consciousness 08/19/2013  . UTI (urinary tract infection) 10/17/2013  . Acute hyperkalemia 12/24/2013    Past Surgical History  Procedure Laterality Date  .  Insertion of suprapubic catheter N/A 05/16/2012    Procedure: INSERTION OF SUPRAPUBIC CATHETER;  Surgeon: Alexis Frock, MD;  Location: [facility];  Service: Urology;  Laterality: N/A;  . Bascilic vein transposition Left 10/30/2012    Procedure: LEFT 1ST STAGE BASCILIC VEIN TRANSPOSITION;  Surgeon: Angelia Mould, MD;  Location: [place];  Service: Vascular;  Laterality: Left;  . Nm myocar perf wall motion  04/19/2012/ 02/2014    a) low risk study-EF 44%,mild inferolateral hypkinesis; b) EF 35%. Mild hypokinesis of the inferior wall with fixed inferior  defect suggesting scar.  . Bascilic vein transposition Left 02/05/2013    Procedure: BASCILIC VEIN TRANSPOSITION 2ND STAGE;  Surgeon: Angelia Mould, MD;  Location: [place];  Service: Vascular;  Laterality: Left;  . Radiology with anesthesia N/A 10/25/2013    Procedure: RADIOLOGY WITH ANESTHESIA;  Surgeon: Medication Radiologist, MD;  Location: [place];  Service: Radiology;  Laterality: N/A;  . Fistulogram Left 04/29/2013    Procedure: FISTULOGRAM;  Surgeon: Angelia Mould, MD;  Location: [facility] CATH LAB;  Service: Cardiovascular;  Laterality: Left;  . Angioplasty Left 04/29/2013    Procedure: ANGIOPLASTY;  Surgeon: Angelia Mould, MD;  Location: [facility] CATH LAB;  Service: Cardiovascular;  Laterality: Left;  . Abdominal aortagram N/A 03/19/2014    Procedure: ABDOMINAL Maxcine Ham;  Surgeon: Elam Dutch, MD;  Location: [facility] CATH LAB;  Service: Cardiovascular;  Laterality: N/A;  . Left and right heart catheterization with coronary angiogram N/A 03/24/2014    Procedure: LEFT AND RIGHT HEART CATHETERIZATION WITH CORONARY ANGIOGRAM;  Surgeon: Leonie Man, MD;  Location: [facility] CATH LAB;  Service: Cardiovascular; Moderate disease in p-mLAD & Ostial OM1; normal right heart pressures. Wedge pressure 29 mmHg. --No lesion to explain inferior defect on Myoview and echocardiogram.   . Endarterectomy popliteal Left 03/26/2014    Procedure: Left popliteal and peroneal artery endarterectomy with vein patch angioplasty;  Surgeon: Elam Dutch, MD;  Location: [place];  Service: Vascular;  Laterality: Left;  . Amputation Left 03/26/2014    Procedure: AMPUTATION SECOND LEFT TOE;  Surgeon: Elam Dutch, MD;  Location: [place];  Service: Vascular;  Laterality: Left;  . Av fistula placement Left 04/28/2014    Procedure: CREATION OF LEFT BRACHIOCEPHALIC  ARTERIOVENOUS (AV) FISTULA CREATION;  Surgeon: Conrad Rio Hondo, MD;  Location: [place];  Service: Vascular;  Laterality: Left;  . Amputation Left 05/10/2014    Procedure:  AMPUTATION BELOW KNEE;  Surgeon: Rosetta Posner, MD;  Location: [facility] OR;  Service: Vascular;  Laterality: Left;  . Esophagogastroduodenoscopy N/A 06/13/2014    Procedure: ESOPHAGOGASTRODUODENOSCOPY (EGD);  Surgeon: Carol Ada, MD;  Location: [facility] ENDOSCOPY;  Service: Endoscopy;  Laterality: N/A;  . Transthoracic echocardiogram  03/22/2014    EF 30-35%, mild LVH. Severe inferior hypokinesis. Mild MR.  Marland Kitchen Below knee leg amputation Left   . Fistulogram Left 10/08/2014    Procedure: LEFT ARM FISTULOGRAM;  Surgeon: Conrad Reliance, MD;  Location: [place];  Service: Vascular;  Laterality: Left;  . Venoplasty Left 10/08/2014    Procedure: LEFT CEPHALIC VEIN VENOPLASTY;  Surgeon: Conrad Marysville, MD;  Location: [place];  Service: Vascular;  Laterality: Left;  . Incision and drainage of wound N/A 10/29/2014    Procedure: IRRIGATION AND DEBRIDEMENT OF SACRAL WOUND, PLACEMENT OF A-CELL;  Surgeon: Theodoro Kos, DO;  Location: [place];  Service: Plastics;  Laterality: N/A;  . Application of a-cell of extremity N/A 10/29/2014    Procedure: PLACEMENT OF APPLICATION OF A-CELL;  Surgeon: Theodoro Kos, DO;  Location: [place];  Service: Plastics;  Laterality: N/A;  . Esophageal manometry N/A 11/24/2014    Procedure: ESOPHAGEAL MANOMETRY (EM);  Surgeon: Carol Ada, MD;  [facility];  Service: Endoscopy;  Laterality: N/A;  . Upper gi endoscopy  03/06/2015    Procedure: UPPER GI ENDOSCOPY;  Surgeon: Michael Boston, MD;  [facility];  Service: General;;    Patient Care Team: Gildardo Cranker, DO as PCP - General (Internal Medicine) Fleet Contras, MD as Consulting Physician (Nephrology) Pixie Casino, MD as Consulting Physician (Cardiology) Carol Ada, MD as Consulting Physician (Gastroenterology) Michael Boston, MD as Consulting Physician (General Surgery) Conrad Poteet, MD as Consulting Physician (Vascular Surgery) Gerlene Fee, NP as Nurse Practitioner (Geriatric Medicine) [place] ([place])  Social History   Social History  . Marital Status: Single    Spouse Name: N/A  . Number of Children: N/A  . Years of Education: N/A   Occupational History  . Not on file.   Social History Main Topics  . Smoking status: Never Smoker   . Smokeless tobacco: Never Used  . Alcohol Use: No     Comment: "stopped drinking in 2013 drank a couple beers/day, 2 days/wk"  . Drug Use: No  . Sexual Activity: No   Other Topics Concern  . Not on file   Social History Narrative   Granddaughter is contact      Resident of [place]      reports that she has never smoked. She has never used smokeless tobacco. She reports that she does not drink alcohol or use illicit drugs.  Family History  Problem Relation Age of Onset  . Hyperlipidemia Mother   . Hypertension Mother   . Hodgkin's lymphoma Father   . Heart disease Sister    Family Status  Relation Status Death Age  . Mother Deceased 75  . Father Deceased 6  . Sister Deceased   . Child Alive   . Child Alive     Immunization History  Administered Date(s) Administered  . Influenza-Unspecified 11/21/2012, 12/22/2013, 11/22/2014  . Pneumococcal Polysaccharide-23 10/21/2013    No Known Allergies  Medications: Patient's Medications  New Prescriptions   No medications on file  Previous Medications   ASPIRIN EC 81 MG TABLET    Take 81 mg by mouth daily.   CETIRIZINE (ZYRTEC) 10 MG TABLET    Take 10 mg by mouth daily as needed for allergies.    INSULIN ASPART (NOVOLOG) 100 UNIT/ML INJECTION    Inject 5-8 Units into the skin 3 (three) times daily before meals. 7 units with breakfast; 5 units with lunch; 8 units with supper   INSULIN GLARGINE (LANTUS) 100 UNIT/ML INJECTION    Inject 0.03 mLs (3 Units total) into the skin daily.   LIDOCAINE-PRILOCAINE (EMLA) CREAM    Apply 1 application topically Every Tuesday,Thursday,and Saturday with dialysis.    MULTIPLE VITAMINS-MINERALS (DECUBI-VITE) CAPS    Take 2  capsules by mouth daily.   OXYCODONE-ACETAMINOPHEN (PERCOCET/ROXICET) 5-325 MG TABLET    Take 1 tablet by mouth every 8 (eight) hours as needed for severe pain.   PANTOPRAZOLE (PROTONIX) 40 MG TABLET    Take 1 tablet (40 mg total) by mouth daily.   PRAVASTATIN (PRAVACHOL) 20 MG TABLET    Take 20 mg by mouth every evening.    PROMETHAZINE (PHENERGAN) 25 MG TABLET    Take 25 mg by mouth every 6 (  six) hours as needed for nausea or vomiting.   SENNA (SENOKOT) 8.6 MG TABS TABLET    Take 1 tablet (8.6 mg total) by mouth daily as needed for mild constipation or moderate constipation.   SEVELAMER CARBONATE (RENVELA) 800 MG TABLET    Take 1 tablet (800 mg total) by mouth 3 (three) times daily with meals.  Modified Medications   No medications on file  Discontinued Medications   No medications on file    Review of Systems  HENT: Positive for trouble swallowing (occasionally).   Gastrointestinal: Positive for nausea.  Musculoskeletal: Positive for gait problem.  All other systems reviewed and are negative.   Filed Vitals:   04/07/15 1100  BP: 110/66  Pulse: 99  Temp: 96.9 F (36.1 C)  Weight: 83 lb (37.649 kg)  SpO2: 94%   Body mass index is 14.71 kg/(m^2).  Physical Exam  Constitutional: She is oriented to person, place, and time. She appears well-developed.  Frail appearing in NAD. Sitting in w/c at bathroom sink  HENT:  Mouth/Throat: Oropharynx is clear and moist. No oropharyngeal exudate.  MMM  Eyes: Pupils are equal, round, and reactive to light. No scleral icterus.  Neck: Neck supple. Carotid bruit is not present. No tracheal deviation present. No thyromegaly present.  Cardiovascular: Normal rate, regular rhythm and intact distal pulses.  Exam reveals no gallop and no friction rub.   Murmur (1/6 SEM) heard. No RLE edema, DP/PT pulse weakly palpable. No right calf TTP. Left BKA. Left arm AVG with palpable thrill and audible bruit  Pulmonary/Chest: Effort normal and breath sounds  normal. No stridor. No respiratory distress. She has no wheezes. She has no rales.  Abdominal: Soft. Bowel sounds are normal. She exhibits no distension and no mass. There is no hepatomegaly. There is no tenderness. There is no rebound and no guarding.  Musculoskeletal:  Left BKA  Lymphadenopathy:    She has no cervical adenopathy.  Neurological: She is alert and oriented to person, place, and time.  Skin: Skin is warm and dry. No rash noted.  Sacral wound per wound care  Psychiatric: She has a normal mood and affect. Her behavior is normal. Judgment and thought content normal.     Labs reviewed: Admission on 03/06/2015, Discharged on 03/13/2015  Component Date Value Ref Range Status  . Glucose-Capillary 03/06/2015 103* 65 - 99 mg/dL Final  . Comment 1 03/06/2015 Notify RN   Final  . WBC 03/06/2015 5.2  4.0 - 10.5 K/uL Final  . RBC 03/06/2015 4.20  3.87 - 5.11 MIL/uL Final  . Hemoglobin 03/06/2015 11.2* 12.0 - 15.0 g/dL Final  . HCT 03/06/2015 35.7* 36.0 - 46.0 % Final  . MCV 03/06/2015 85.0  78.0 - 100.0 fL Final  . MCH 03/06/2015 26.7  26.0 - 34.0 pg Final  . MCHC 03/06/2015 31.4  30.0 - 36.0 g/dL Final  . RDW 03/06/2015 19.1* 11.5 - 15.5 % Final  . Platelets 03/06/2015 190  150 - 400 K/uL Final  . Sodium 03/06/2015 139  135 - 145 mmol/L Final  . Potassium 03/06/2015 4.6  3.5 - 5.1 mmol/L Final  . Chloride 03/06/2015 100* 101 - 111 mmol/L Final  . CO2 03/06/2015 26  22 - 32 mmol/L Final  . Glucose, Bld 03/06/2015 107* 65 - 99 mg/dL Final  . BUN 03/06/2015 37* 6 - 20 mg/dL Final  . Creatinine, Ser 03/06/2015 3.31* 0.44 - 1.00 mg/dL Final  . Calcium 03/06/2015 8.2* 8.9 - 10.3 mg/dL Final  .  GFR calc non Af Amer 03/06/2015 13* >60 mL/min Final  . GFR calc Af Amer 03/06/2015 15* >60 mL/min Final   Comment: (NOTE) The eGFR has been calculated using the CKD EPI equation. This calculation has not been validated in all clinical situations. eGFR's persistently <60 mL/min signify  possible Chronic Kidney Disease.   . Anion gap 03/06/2015 13  5 - 15 Final  . Hgb A1c MFr Bld 03/06/2015 7.5* 4.8 - 5.6 % Final   Comment: (NOTE)         Pre-diabetes: 5.7 - 6.4         Diabetes: >6.4         Glycemic control for adults with diabetes: <7.0   . Mean Plasma Glucose 03/06/2015 169   Final   Comment: (NOTE) Performed At: [facility] [place], [place] [id number] Lindon Romp MD [id number]   . Glucose-Capillary 03/06/2015 239* 65 - 99 mg/dL Final  . Comment 1 03/06/2015 Document in Chart   Final  . Glucose-Capillary 03/06/2015 212* 65 - 99 mg/dL Final  . Comment 1 03/06/2015 Notify RN   Final  . Comment 2 03/06/2015 Document in Chart   Final  . Glucose-Capillary 03/06/2015 178* 65 - 99 mg/dL Final  . Comment 1 03/06/2015 Notify RN   Final  . Comment 2 03/06/2015 Document in Chart   Final  . WBC 03/07/2015 9.2  4.0 - 10.5 K/uL Final  . RBC 03/07/2015 3.91  3.87 - 5.11 MIL/uL Final  . Hemoglobin 03/07/2015 10.6* 12.0 - 15.0 g/dL Final  . HCT 03/07/2015 33.6* 36.0 - 46.0 % Final  . MCV 03/07/2015 85.9  78.0 - 100.0 fL Final  . MCH 03/07/2015 27.1  26.0 - 34.0 pg Final  . MCHC 03/07/2015 31.5  30.0 - 36.0 g/dL Final  . RDW 03/07/2015 19.1* 11.5 - 15.5 % Final  . Platelets 03/07/2015 159  150 - 400 K/uL Final  . Sodium 03/07/2015 138  135 - 145 mmol/L Final  . Potassium 03/07/2015 5.5* 3.5 - 5.1 mmol/L Final  . Chloride 03/07/2015 100* 101 - 111 mmol/L Final  . CO2 03/07/2015 25  22 - 32 mmol/L Final  . Glucose, Bld 03/07/2015 146* 65 - 99 mg/dL Final  . BUN 03/07/2015 41* 6 - 20 mg/dL Final  . Creatinine, Ser 03/07/2015 4.28* 0.44 - 1.00 mg/dL Final  . Calcium 03/07/2015 8.0* 8.9 - 10.3 mg/dL Final  . GFR calc non Af Amer 03/07/2015 10* >60 mL/min Final  . GFR calc Af Amer 03/07/2015 11* >60 mL/min Final   Comment: (NOTE) The eGFR has been calculated using the CKD EPI equation. This calculation has not been validated in all clinical  situations. eGFR's persistently <60 mL/min signify possible Chronic Kidney Disease.   . Anion gap 03/07/2015 13  5 - 15 Final  . Hgb A1c MFr Bld 03/07/2015 7.6* 4.8 - 5.6 % Final   Comment: (NOTE)         Pre-diabetes: 5.7 - 6.4         Diabetes: >6.4         Glycemic control for adults with diabetes: <7.0   . Mean Plasma Glucose 03/07/2015 171   Final   Comment: (NOTE) Performed At: [facility] [place], [place] [id number] Lindon Romp MD PI:[id number]   . Glucose-Capillary 03/06/2015 127* 65 - 99 mg/dL Final  . Glucose-Capillary 03/07/2015 123* 65 - 99 mg/dL Final  . Glucose-Capillary 03/07/2015 59* 65 - 99 mg/dL  Final  . Glucose-Capillary 03/07/2015 84  65 - 99 mg/dL Final  . Glucose-Capillary 03/07/2015 36* 65 - 99 mg/dL Final  . Comment 1 03/07/2015 Notify RN   Final  . Comment 2 03/07/2015 Document in Chart   Final  . Glucose-Capillary 03/07/2015 141* 65 - 99 mg/dL Final  . Glucose-Capillary 03/07/2015 148* 65 - 99 mg/dL Final  . Comment 1 03/07/2015 Notify RN   Final  . Comment 2 03/07/2015 Document in Chart   Final  . Glucose-Capillary 03/08/2015 120* 65 - 99 mg/dL Final  . Glucose-Capillary 03/08/2015 110* 65 - 99 mg/dL Final  . Glucose-Capillary 03/08/2015 112* 65 - 99 mg/dL Final  . Glucose-Capillary 03/08/2015 187* 65 - 99 mg/dL Final  . Glucose-Capillary 03/08/2015 110* 65 - 99 mg/dL Final  . Glucose-Capillary 03/08/2015 98  65 - 99 mg/dL Final  . Comment 1 03/08/2015 Notify RN   Final  . Comment 2 03/08/2015 Document in Chart   Final  . Glucose-Capillary 03/08/2015 87  65 - 99 mg/dL Final  . Glucose-Capillary 03/08/2015 87  65 - 99 mg/dL Final  . Comment 1 03/08/2015 Notify RN   Final  . Comment 2 03/08/2015 Document in Chart   Final  . Glucose-Capillary 03/09/2015 62* 65 - 99 mg/dL Final  . Glucose-Capillary 03/09/2015 98  65 - 99 mg/dL Final  . Glucose-Capillary 03/09/2015 83  65 - 99 mg/dL Final  . Comment 1 03/09/2015 Notify RN    Final  . Glucose-Capillary 03/09/2015 83  65 - 99 mg/dL Final  . Comment 1 03/09/2015 Notify RN   Final  . Glucose-Capillary 03/09/2015 130* 65 - 99 mg/dL Final  . Comment 1 03/09/2015 Notify RN   Final  . Specimen Description 03/09/2015 URINE, CATHETERIZED   Final  . Special Requests 03/09/2015 Normal   Final  . Culture 03/09/2015 >=100,000 COLONIES/mL YEAST   Final  . Report Status 03/09/2015 03/11/2015 FINAL   Final  . Color, Urine 03/09/2015 YELLOW  YELLOW Final  . APPearance 03/09/2015 CLOUDY* CLEAR Final  . Specific Gravity, Urine 03/09/2015 1.016  1.005 - 1.030 Final  . pH 03/09/2015 7.0  5.0 - 8.0 Final  . Glucose, UA 03/09/2015 NEGATIVE  NEGATIVE mg/dL Final  . Hgb urine dipstick 03/09/2015 MODERATE* NEGATIVE Final  . Bilirubin Urine 03/09/2015 NEGATIVE  NEGATIVE Final  . Ketones, ur 03/09/2015 15* NEGATIVE mg/dL Final  . Protein, ur 03/09/2015 100* NEGATIVE mg/dL Final  . Nitrite 03/09/2015 NEGATIVE  NEGATIVE Final  . Leukocytes, UA 03/09/2015 LARGE* NEGATIVE Final  . Glucose-Capillary 03/09/2015 116* 65 - 99 mg/dL Final  . Comment 1 03/09/2015 Notify RN   Final  . Specimen Description 03/09/2015 BLOOD LEFT HAND   Final  . Special Requests 03/09/2015 IN PEDIATRIC BOTTLE 3CC   Final  . Culture 03/09/2015 NO GROWTH 5 DAYS   Final  . Report Status 03/09/2015 03/14/2015 FINAL   Final  . Specimen Description 03/09/2015 BLOOD LEFT ANTECUBITAL   Final  . Special Requests 03/09/2015 BOTTLES DRAWN AEROBIC AND ANAEROBIC 5CC   Final  . Culture 03/09/2015 NO GROWTH 5 DAYS   Final  . Report Status 03/09/2015 03/14/2015 FINAL   Final  . Squamous Epithelial / LPF 03/09/2015 6-30* NONE SEEN Final  . WBC, UA 03/09/2015 TOO NUMEROUS TO COUNT  0 - 5 WBC/hpf Final  . RBC / HPF 03/09/2015 0-5  0 - 5 RBC/hpf Final  . Bacteria, UA 03/09/2015 FEW* NONE SEEN Final  . Urine-Other 03/09/2015 MUCOUS PRESENT   Final  YEAST PRESENT  . Glucose-Capillary 03/09/2015 116* 65 - 99 mg/dL Final  . Comment  1 03/09/2015 Notify RN   Final  . WBC 03/10/2015 10.3  4.0 - 10.5 K/uL Final  . RBC 03/10/2015 3.83* 3.87 - 5.11 MIL/uL Final  . Hemoglobin 03/10/2015 10.3* 12.0 - 15.0 g/dL Final  . HCT 03/10/2015 32.8* 36.0 - 46.0 % Final  . MCV 03/10/2015 85.6  78.0 - 100.0 fL Final  . MCH 03/10/2015 26.9  26.0 - 34.0 pg Final  . MCHC 03/10/2015 31.4  30.0 - 36.0 g/dL Final  . RDW 03/10/2015 19.1* 11.5 - 15.5 % Final  . Platelets 03/10/2015 121* 150 - 400 K/uL Final  . Sodium 03/10/2015 134* 135 - 145 mmol/L Final  . Potassium 03/10/2015 5.7* 3.5 - 5.1 mmol/L Final  . Chloride 03/10/2015 98* 101 - 111 mmol/L Final  . CO2 03/10/2015 25  22 - 32 mmol/L Final  . Glucose, Bld 03/10/2015 127* 65 - 99 mg/dL Final  . BUN 03/10/2015 44* 6 - 20 mg/dL Final  . Creatinine, Ser 03/10/2015 5.62* 0.44 - 1.00 mg/dL Final  . Calcium 03/10/2015 7.1* 8.9 - 10.3 mg/dL Final  . Phosphorus 03/10/2015 6.5* 2.5 - 4.6 mg/dL Final  . Albumin 03/10/2015 2.1* 3.5 - 5.0 g/dL Final  . GFR calc non Af Amer 03/10/2015 7* >60 mL/min Final  . GFR calc Af Amer 03/10/2015 8* >60 mL/min Final   Comment: (NOTE) The eGFR has been calculated using the CKD EPI equation. This calculation has not been validated in all clinical situations. eGFR's persistently <60 mL/min signify possible Chronic Kidney Disease.   . Anion gap 03/10/2015 11  5 - 15 Final  . C Diff antigen 03/11/2015 NEGATIVE  NEGATIVE Final  . C Diff toxin 03/11/2015 NEGATIVE  NEGATIVE Final  . C Diff interpretation 03/11/2015 Negative for toxigenic C. difficile   Final  . Glucose-Capillary 03/10/2015 80  65 - 99 mg/dL Final  . Glucose-Capillary 03/10/2015 97  65 - 99 mg/dL Final  . Glucose-Capillary 03/10/2015 115* 65 - 99 mg/dL Final  . Glucose-Capillary 03/10/2015 163* 65 - 99 mg/dL Final  . Glucose-Capillary 03/11/2015 78  65 - 99 mg/dL Final  . Comment 1 03/11/2015 Notify RN   Final  . Glucose-Capillary 03/11/2015 61* 65 - 99 mg/dL Final  . Comment 1 03/11/2015  Notify RN   Final  . Glucose-Capillary 03/11/2015 87  65 - 99 mg/dL Final  . Glucose-Capillary 03/11/2015 30* 65 - 99 mg/dL Final  . Comment 1 03/11/2015 Notify RN   Final  . Glucose-Capillary 03/11/2015 173* 65 - 99 mg/dL Final  . Glucose-Capillary 03/11/2015 57* 65 - 99 mg/dL Final  . Comment 1 03/11/2015 Notify RN   Final  . Glucose-Capillary 03/11/2015 169* 65 - 99 mg/dL Final  . Comment 1 03/11/2015 Notify RN   Final  . Glucose-Capillary 03/12/2015 187* 65 - 99 mg/dL Final  . Glucose-Capillary 03/12/2015 142* 65 - 99 mg/dL Final  . Sodium 03/12/2015 130* 135 - 145 mmol/L Final  . Potassium 03/12/2015 4.5  3.5 - 5.1 mmol/L Final  . Chloride 03/12/2015 94* 101 - 111 mmol/L Final  . CO2 03/12/2015 24  22 - 32 mmol/L Final  . Glucose, Bld 03/12/2015 144* 65 - 99 mg/dL Final  . BUN 03/12/2015 28* 6 - 20 mg/dL Final  . Creatinine, Ser 03/12/2015 4.17* 0.44 - 1.00 mg/dL Final  . Calcium 03/12/2015 7.2* 8.9 - 10.3 mg/dL Final  . Phosphorus 03/12/2015 3.7  2.5 - 4.6  mg/dL Final  . Albumin 03/12/2015 2.0* 3.5 - 5.0 g/dL Final  . GFR calc non Af Amer 03/12/2015 10* >60 mL/min Final  . GFR calc Af Amer 03/12/2015 11* >60 mL/min Final   Comment: (NOTE) The eGFR has been calculated using the CKD EPI equation. This calculation has not been validated in all clinical situations. eGFR's persistently <60 mL/min signify possible Chronic Kidney Disease.   . Anion gap 03/12/2015 12  5 - 15 Final  . WBC 03/12/2015 5.3  4.0 - 10.5 K/uL Final  . RBC 03/12/2015 3.68* 3.87 - 5.11 MIL/uL Final  . Hemoglobin 03/12/2015 9.8* 12.0 - 15.0 g/dL Final  . HCT 03/12/2015 31.3* 36.0 - 46.0 % Final  . MCV 03/12/2015 85.1  78.0 - 100.0 fL Final  . MCH 03/12/2015 26.6  26.0 - 34.0 pg Final  . MCHC 03/12/2015 31.3  30.0 - 36.0 g/dL Final  . RDW 03/12/2015 18.7* 11.5 - 15.5 % Final  . Platelets 03/12/2015 144* 150 - 400 K/uL Final  . Glucose-Capillary 03/12/2015 77  65 - 99 mg/dL Final  . Glucose-Capillary  03/12/2015 118* 65 - 99 mg/dL Final  . Glucose-Capillary 03/12/2015 147* 65 - 99 mg/dL Final  . Glucose-Capillary 03/12/2015 123* 65 - 99 mg/dL Final  . Glucose-Capillary 03/12/2015 106* 65 - 99 mg/dL Final  . Glucose-Capillary 03/13/2015 114* 65 - 99 mg/dL Final  . Glucose-Capillary 03/13/2015 129* 65 - 99 mg/dL Final  . Comment 1 03/13/2015 Notify RN   Final  . Glucose-Capillary 03/13/2015 125* 65 - 99 mg/dL Final  . Comment 1 03/13/2015 Notify RN   Final  . Glucose-Capillary 03/13/2015 98  65 - 99 mg/dL Final  . Comment 1 03/13/2015 Notify RN   Final    Dg Chest 2 View  03/09/2015  CLINICAL DATA:  Rule out aspiration pneumonia. EXAM: CHEST  2 VIEW COMPARISON:  06/25/2014 FINDINGS: There has been interval removal of dual lumen central venous catheter. 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 The cardiac silhouette is enlarged. Mediastinal contours appear intact. There is no evidence of pneumothorax. There are bilateral small pleural effusions. Airspace consolidation is seen in the right cardiophrenic angle. Osseous structures are without acute abnormality. IMPRESSION: Enlarged cardiac silhouette. Small bilateral pleural effusions. Airspace opacity in the right cardiophrenic angle, which likely represents focal airspace consolidation. Electronically Signed   By: Fidela Salisbury M.D.   On: 03/09/2015 17:24   Dg Abd 2 Views  03/09/2015  CLINICAL DATA:  Abdominal pain for 4 days, fever EXAM: ABDOMEN - 2 VIEW COMPARISON:  06/12/2014 FINDINGS: There is normal small bowel gas pattern. Degenerative changes are noted lumbar spine. Contrast material noted throughout the colon from recent esophagram study. IMPRESSION: Normal small bowel gas pattern. Residual contrast material throughout the colon. No colonic distension. Degenerative changes lumbar spine. Electronically Signed   By: Lahoma Crocker M.D.   On: 03/09/2015 16:07   Dg Esophagus W/water Sol Cm  03/08/2015   CLINICAL DATA:  Patient status post Heller myotomy and fundoplication. History of achalasia. EXAM: ESOPHOGRAM/BARIUM SWALLOW TECHNIQUE: Single contrast examination was performed using  water-soluble. FLUOROSCOPY TIME:  Radiation Exposure Index (as provided by the fluoroscopic device): If the device does not provide the exposure index: Fluoroscopy Time:  1 minutes 50 seconds Number of Acquired Images:  8 COMPARISON:  Upper GI 06/16/2014 FINDINGS: The patient was examined in a semi-upright supine position. Patient swallowed a small moderate volume of oral contrast (59 mL). The contrast flowed through  the esophagus into the stomach. There is narrowed stricturing at the GE junction with only a thin stream ofcontrast passing into stomach. There is significant retention of the oral contrast within the esophagus which is patulous. There is indentation along the lower third of the esophagus over a 9 cm segment. The these findings are similar to the pre surgical esophagram. No evidence of leak. IMPRESSION: 1. Narrow but patent gastroesophageal junction. Contrast does flows into the stomach but a large portion of the contrast remains within the patulous esophagus. Recommend caution in advancing diet. 2. No esophageal leak. Findings conveyed toFloor nurseon 03/08/2015 at13:16. ON call surgeon paged. Electronically Signed   By: Suzy Bouchard M.D.   On: 03/08/2015 13:17     Assessment/Plan   ICD-9-CM ICD-10-CM   1. 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 Controlled type 2 diabetes mellitus with chronic kidney disease on chronic dialysis, with long-term current use of insulin (HCC) 250.40 E11.22    585.9 N18.6    V58.67 Z79.4    V45.11 Z99.2   3. 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 ESRD on hemodialysis (HCC) 585.6 N18.6    V45.11 Z99.2   5. Chronic systolic heart failure (HCC) 428.22 I50.22   6. Peripheral artery disease (HCC) 443.9 I73.9   7. S/P  BKA (below knee amputation) unilateral, left (HCC) V49.75 Z89.512   8. Protein-calorie malnutrition, severe ([place]) 262 E43   9. Anemia of chronic disease 285.29 D63.8   10. Dyslipidemia associated with type 2 diabetes mellitus ([place]) 250.80 E11.69    272.4 E78.5     Cont nutritional supplements as ordered  HD as scheduled  F/u with GI as schedu;led  Fluid restriction 1200cc per day  Wound care as ordered  PT/OT/ST as ordered  GOAL: long term resident. Communicated with pt and nursing.  Will follow  Kimberely Mccannon S. Perlie Gold  [facility] [address] [place], [place] [address] [phone] Cell (Monday-Friday 8 AM - 5 PM) [phone] After 5 PM and follow prompts

## 2015-04-20 ENCOUNTER — Other Ambulatory Visit: Payer: Medicare Other

## 2015-04-20 ENCOUNTER — Other Ambulatory Visit: Payer: Self-pay | Admitting: *Deleted

## 2015-04-20 DIAGNOSIS — N186 End stage renal disease: Principal | ICD-10-CM

## 2015-04-20 DIAGNOSIS — Z992 Dependence on renal dialysis: Principal | ICD-10-CM

## 2015-04-20 DIAGNOSIS — E1122 Type 2 diabetes mellitus with diabetic chronic kidney disease: Secondary | ICD-10-CM

## 2015-04-24 ENCOUNTER — Ambulatory Visit: Payer: Medicare Other | Admitting: Endocrinology

## 2015-04-30 ENCOUNTER — Non-Acute Institutional Stay (SKILLED_NURSING_FACILITY): Payer: Medicare Other | Admitting: Adult Health

## 2015-04-30 ENCOUNTER — Encounter: Payer: Self-pay | Admitting: Adult Health

## 2015-04-30 DIAGNOSIS — J111 Influenza due to unidentified influenza virus with other respiratory manifestations: Secondary | ICD-10-CM | POA: Diagnosis not present

## 2015-05-01 NOTE — Progress Notes (Signed)
Patient ID: Elizabeth [name], female   DOB: Aug 19, 1945, 70 y.o.   MRN: [id number]   Facility: [facility]       No Known Allergies  Chief Complaint  Patient presents with  . Acute Visit    HPI:  She is complaining of cough; congestion; sputum production; sore throat and fatigue. There are no reports of fever. She did tell me that her appetite is worse. There is influenza in the facility and will need to test for this and treat.    Past Medical History  Diagnosis Date  . Coronary artery disease, non-occlusive January 2016    Moderate p&mLAD & Ostial OM; Myoview Nuclear Stress Test 03/21/2014: EF 35%. Mild hypokinesis of the inferior wall with fixed inferior defect suggesting scar. (no culprit lesion on CATH)  . Peripheral vascular disease of lower extremity with ulceration ([place]) January-March 2016    PV Angiogram 03/19/2014: Bilateral AP and PT artery occlusions, 90% Left peroneal artery with single-vessel Right peroneal and flow  . Critical lower limb ischemia     Status post left BKA following left popliteal and peroneal artery endarterectomy with patch angioplasty.  . Diabetic neuropathy ([place])   . Hyperlipidemia with target LDL less than 70   . History of CVA (cerebrovascular accident) 08/19/2013    "minor one"  . ESRD (end stage renal disease) ([employer])     [address], [place], [place] (06/12/2014)  . Urinary retention     DX NEUROGENIC BLADDER--HAS INDWELLING FOLEY CATHETER  . Foley catheter in place     for urinary retention  . Anemia   . Hematemesis April 2016    Hospitalized  . Arthritis of knee, right     "right knee" (06/12/2014)  . Gout     "was in my LLE"  . Diabetes mellitus, insulin dependent (IDDM), uncontrolled ([place])     [place]; has h/o Hypo & Hyperglycemic episodes, Type II  . Foot infection   . Loss of consciousness 08/19/2013  . UTI (urinary tract infection) 10/17/2013  . Acute hyperkalemia 12/24/2013    Past Surgical History  Procedure Laterality Date  .  Insertion of suprapubic catheter N/A 05/16/2012    Procedure: INSERTION OF SUPRAPUBIC CATHETER;  Surgeon: Alexis Frock, MD;  Location: [facility];  Service: Urology;  Laterality: N/A;  . Bascilic vein transposition Left 10/30/2012    Procedure: LEFT 1ST STAGE BASCILIC VEIN TRANSPOSITION;  Surgeon: Angelia Mould, MD;  Location: [place];  Service: Vascular;  Laterality: Left;  . Nm myocar perf wall motion  04/19/2012/ 02/2014    a) low risk study-EF 44%,mild inferolateral hypkinesis; b) EF 35%. Mild hypokinesis of the inferior wall with fixed inferior defect suggesting scar.  . Bascilic vein transposition Left 02/05/2013    Procedure: BASCILIC VEIN TRANSPOSITION 2ND STAGE;  Surgeon: Angelia Mould, MD;  Location: [place];  Service: Vascular;  Laterality: Left;  . Radiology with anesthesia N/A 10/25/2013    Procedure: RADIOLOGY WITH ANESTHESIA;  Surgeon: Medication Radiologist, MD;  Location: [place];  Service: Radiology;  Laterality: N/A;  . Fistulogram Left 04/29/2013    Procedure: FISTULOGRAM;  Surgeon: Angelia Mould, MD;  Location: [facility] CATH LAB;  Service: Cardiovascular;  Laterality: Left;  . Angioplasty Left 04/29/2013    Procedure: ANGIOPLASTY;  Surgeon: Angelia Mould, MD;  Location: [facility] CATH LAB;  Service: Cardiovascular;  Laterality: Left;  . Abdominal aortagram N/A 03/19/2014    Procedure: ABDOMINAL Maxcine Ham;  Surgeon: Elam Dutch, MD;  Location: [facility] CATH LAB;  Service: Cardiovascular;  Laterality: N/A;  . Left and right heart catheterization with coronary angiogram N/A 03/24/2014    Procedure: LEFT AND RIGHT HEART CATHETERIZATION WITH CORONARY ANGIOGRAM;  Surgeon: Leonie Man, MD;  Location: [facility] CATH LAB;  Service: Cardiovascular; Moderate disease in p-mLAD & Ostial OM1; normal right heart pressures. Wedge pressure 29 mmHg. --No lesion to explain inferior defect on Myoview and echocardiogram.   . Endarterectomy popliteal Left 03/26/2014    Procedure: Left popliteal and  peroneal artery endarterectomy with vein patch angioplasty;  Surgeon: Elam Dutch, MD;  Location: [place];  Service: Vascular;  Laterality: Left;  . Amputation Left 03/26/2014    Procedure: AMPUTATION SECOND LEFT TOE;  Surgeon: Elam Dutch, MD;  Location: [place];  Service: Vascular;  Laterality: Left;  . Av fistula placement Left 04/28/2014    Procedure: CREATION OF LEFT BRACHIOCEPHALIC  ARTERIOVENOUS (AV) FISTULA CREATION;  Surgeon: Conrad Cayuga, MD;  Location: [place];  Service: Vascular;  Laterality: Left;  . Amputation Left 05/10/2014    Procedure: AMPUTATION BELOW KNEE;  Surgeon: Rosetta Posner, MD;  Location: [facility] OR;  Service: Vascular;  Laterality: Left;  . Esophagogastroduodenoscopy N/A 06/13/2014    Procedure: ESOPHAGOGASTRODUODENOSCOPY (EGD);  Surgeon: Carol Ada, MD;  Location: [facility] ENDOSCOPY;  Service: Endoscopy;  Laterality: N/A;  . Transthoracic echocardiogram  03/22/2014    EF 30-35%, mild LVH. Severe inferior hypokinesis. Mild MR.  Marland Kitchen Below knee leg amputation Left   . Fistulogram Left 10/08/2014    Procedure: LEFT ARM FISTULOGRAM;  Surgeon: Conrad Dillon Beach, MD;  Location: [place];  Service: Vascular;  Laterality: Left;  . Venoplasty Left 10/08/2014    Procedure: LEFT CEPHALIC VEIN VENOPLASTY;  Surgeon: Conrad Enoch, MD;  Location: [place];  Service: Vascular;  Laterality: Left;  . Incision and drainage of wound N/A 10/29/2014    Procedure: IRRIGATION AND DEBRIDEMENT OF SACRAL WOUND, PLACEMENT OF A-CELL;  Surgeon: Theodoro Kos, DO;  Location: [place];  Service: Plastics;  Laterality: N/A;  . Application of a-cell of extremity N/A 10/29/2014    Procedure: PLACEMENT OF APPLICATION OF A-CELL;  Surgeon: Theodoro Kos, DO;  Location: [place];  Service: Plastics;  Laterality: N/A;  . Esophageal manometry N/A 11/24/2014    Procedure: ESOPHAGEAL MANOMETRY (EM);  Surgeon: Carol Ada, MD;  [facility];  Service: Endoscopy;  Laterality: N/A;  . Upper gi endoscopy  03/06/2015    Procedure: UPPER GI  ENDOSCOPY;  Surgeon: Michael Boston, MD;  Location: [facility];  Service: General;;    VITAL SIGNS BP 100/50 mmHg  Pulse 82  Temp(Src) 98.3 F (36.8 C) (Oral)  Resp 20  Ht 5' 3"  (1.6 m)  Wt 84 lb (38.102 kg)  BMI 14.88 kg/m2  SpO2 95%  Patient's Medications  New Prescriptions   No medications on file  Previous Medications   ASPIRIN EC 81 MG TABLET    Take 81 mg by mouth daily.   CETIRIZINE (ZYRTEC) 10 MG TABLET    Take 10 mg by mouth daily as needed for allergies.    INSULIN ASPART (NOVOLOG) 100 UNIT/ML INJECTION    Inject 5-8 Units into the skin 3 (three) times daily before meals. 7 units with breakfast; 5 units with lunch; 8 units with supper   INSULIN GLARGINE (LANTUS) 100 UNIT/ML INJECTION    Inject 6 Units into the skin every morning.   LIDOCAINE-PRILOCAINE (EMLA) CREAM    Apply 1 application topically Every Tuesday,Thursday,and Saturday with dialysis.    MULTIPLE  VITAMINS-MINERALS (DECUBI-VITE) CAPS    Take 2 capsules by mouth daily.   OSELTAMIVIR (TAMIFLU) 30 MG CAPSULE    Take 30 mg by mouth daily.   OXYCODONE-ACETAMINOPHEN (PERCOCET/ROXICET) 5-325 MG TABLET    Take 1 tablet by mouth every 8 (eight) hours as needed for severe pain.   PANTOPRAZOLE (PROTONIX) 40 MG TABLET    Take 1 tablet (40 mg total) by mouth daily.   PRAVASTATIN (PRAVACHOL) 20 MG TABLET    Take 20 mg by mouth every evening.    PROMETHAZINE (PHENERGAN) 25 MG TABLET    Take 25 mg by mouth every 6 (six) hours as needed for nausea or vomiting.   SENNA (SENOKOT) 8.6 MG TABS TABLET    Take 1 tablet (8.6 mg total) by mouth daily as needed for mild constipation or moderate constipation.   SEVELAMER CARBONATE (RENVELA) 800 MG TABLET    Take 1 tablet (800 mg total) by mouth 3 (three) times daily with meals.   ZINC SULFATE 220 MG CAPSULE    Take 220 mg by mouth daily. X 45 days until 05/03/15  Modified Medications   No medications on file  Discontinued Medications   INSULIN GLARGINE (LANTUS) 100 UNIT/ML INJECTION    Inject  0.03 mLs (3 Units total) into the skin daily.     SIGNIFICANT DIAGNOSTIC EXAMS  03-22-14: 2-d echo: - Left ventricle: The cavity size was normal. Wall thickness was increased in a pattern of mild LVH. Systolic function was moderately to severely reduced. The estimated ejection fraction was in the range of 30% to 35%. Severe hypokinesis of the inferior myocardium. - Mitral valve: There was mild regurgitation. - Left atrium: The atrium was mildly dilated. - Pulmonary arteries: PA peak pressure: 31 mm Hg (S). - Pericardium, extracardiac: A trivial pericardial effusion was identified. There was a left pleural effusion.  05-04-14: chest x-ray: mild chf with bilateral air space disease and small bilateral pleural effusion   05-09-14: chest x-ray: 1. Stable mild cardiomegaly. 2. Decreased pleural effusions and improved aeration.  05-09-14: left foot x-ray: Acute osteomyelitis involving in the residual 2nd metatarsal, 3rd metatarsal head, and 3rd proximal phalanx.Associated pathologic fracture of the 3rd proximal phalanx.  03-08-15: barium swallow: 1. Narrow but patent gastroesophageal junction. Contrast does flows into the stomach but a large portion of the contrast remains within the patulous esophagus. Recommend caution in advancing diet. 2. No esophageal leak  03-09-15: kub: Normal small bowel gas pattern. Residual contrast material throughout the colon. No colonic distension. Degenerative changes lumbar spine  03-09-15: chest x-ray: Enlarged cardiac silhouette. Small bilateral pleural effusions. Airspace opacity in the right cardiophrenic angle, which likely represents focal airspace consolidation.      LABS REVIEWED:   04-01-14: wbc 12.1; hgb 9.6; hct 34.7; mcv 92.4; plt 434 05-02-14: urine culture: neg  05-09-14: wbc 8.5; hgb 11.;4 hct 37.1; mcv 87.1 plt 252; glucose 299; bun 43; creat 3.49; k+ 3.7; na++135; liver normal albumin 2.1; hgb a1c 7.0; tsh 1.242 05-11-14: wbc 9.7; hgb 10.8; hct 37.1;  mcv 90.3; plt 213; glucose 114; bun 24; creat 3.12; k+4.5; na++136  05-12-14: wbc 11.9; hgb 10.4; hct 33.8; mcv 87.3 plt 225; glucose 151; bun 52; creat 4.52; k+5.0; na++135  06-28-14: wbc 5.9; hgb 11.8; hct 37.7; mcv 84.2; plt 103; glucose 86; bun 52; creat 3.74; k+5.5; na++134; albumin 2.2; phos 5.4  08-13-14: wbc 5.7 hgb 12.5 ;hct 40.5; mcv 84.6; plt 153; glucose 126; bun 80; creat 5.22; k+ 5.3; na++134; alk phos 145; ast  60; alt 83; albumin 3.0; hgb a1c 7.7; chol 146; ;ldl 44; trig 74; hdl 40 11-24-14: hgb a1c 7.0   03-06-15: wbc 5.2; hgb 11.2; hct 35.7; mcv 85.0; plt 190; glucose 107; bun 37; creat 3.31; k+ 4.6; na++139 hgb a1c 7.5  03-09-15: urine culture: yeast; blood culture: no growth 03-11-15: stool for c-diff: neg 03-12-15: wbc 5.3; hgb 9.8; hct 31.1; mcv 85.1; plt 144; glucose 144; bun 28; creat 4.17; k+ 4.5; na++130; phos 3.7; albumin 2.0    Review of Systems  Constitutional: Positive for malaise/fatigue. Negative for fever.  HENT: Positive for congestion and sore throat.   Respiratory: Positive for cough and sputum production. Negative for shortness of breath.   Cardiovascular: Negative for chest pain, palpitations and leg swelling.  Gastrointestinal: Negative for heartburn, abdominal pain and constipation.  Musculoskeletal: Negative for myalgias, back pain and joint pain.  Skin: Negative.   Neurological: Negative for dizziness.  Psychiatric/Behavioral: The patient is not nervous/anxious.        Physical Exam Constitutional: No distress.  Frail   Eyes: Conjunctivae are normal.  Neck: Neck supple. No JVD present. No thyromegaly present.  Cardiovascular: Normal rate, regular rhythm and intact distal pulses.   Respiratory: Effort normal and breath sounds normal. No respiratory distress. She has no wheezes.  GI: Soft. Bowel sounds are normal. She exhibits no distension. There is no tenderness.    Musculoskeletal: She exhibits no edema.  Able to move all extremities  Left bka     Lymphadenopathy:    She has no cervical adenopathy.  Neurological: She is alert.  Skin: Skin is warm and dry. She is not diaphoretic.  Psychiatric: She has a normal mood and affect.  Sacral wound 0.4 x  0.5 cm being followed by wound clinic Has left upper extremity A/V fistula      ASSESSMENT/ PLAN:  Possible influenza: will get a rapid flu swab; will being tamiflu 30 mg daily for 5 days and will begin flonase twice daily for her congestion and will monitor her status.     Ok Edwards NP [facility]  Contact [phone] Monday through Friday 8am- 5pm  After hours call [phone]

## 2015-05-06 ENCOUNTER — Non-Acute Institutional Stay (SKILLED_NURSING_FACILITY): Payer: Medicare Other | Admitting: Adult Health

## 2015-05-06 ENCOUNTER — Encounter: Payer: Self-pay | Admitting: Adult Health

## 2015-05-06 DIAGNOSIS — L89154 Pressure ulcer of sacral region, stage 4: Secondary | ICD-10-CM | POA: Diagnosis not present

## 2015-05-06 DIAGNOSIS — E1169 Type 2 diabetes mellitus with other specified complication: Secondary | ICD-10-CM | POA: Diagnosis not present

## 2015-05-06 DIAGNOSIS — E1122 Type 2 diabetes mellitus with diabetic chronic kidney disease: Secondary | ICD-10-CM | POA: Diagnosis not present

## 2015-05-06 DIAGNOSIS — I5022 Chronic systolic (congestive) heart failure: Secondary | ICD-10-CM | POA: Diagnosis not present

## 2015-05-06 DIAGNOSIS — Z89512 Acquired absence of left leg below knee: Secondary | ICD-10-CM

## 2015-05-06 DIAGNOSIS — N186 End stage renal disease: Secondary | ICD-10-CM | POA: Diagnosis not present

## 2015-05-06 DIAGNOSIS — K220 Achalasia of cardia: Secondary | ICD-10-CM

## 2015-05-06 DIAGNOSIS — E785 Hyperlipidemia, unspecified: Secondary | ICD-10-CM

## 2015-05-06 DIAGNOSIS — L98429 Non-pressure chronic ulcer of back with unspecified severity: Secondary | ICD-10-CM

## 2015-05-06 DIAGNOSIS — Z992 Dependence on renal dialysis: Secondary | ICD-10-CM | POA: Diagnosis not present

## 2015-05-06 NOTE — Progress Notes (Signed)
Patient ID: Elizabeth [name], female   DOB: 1945/11/07, 70 y.o.   MRN: [id number]   Facility: [facility]       No Known Allergies  Chief Complaint  Patient presents with  . Medical Management of Chronic Issues    Follow-up    HPI:  She is a long term resident of this facility being seen for the management of her chronic illnesses. She tells me that she is feeling better; but still has a cough with clear sputum production. She tells me that the cough is interfering with her ability to keep her food down.    Past Medical History  Diagnosis Date  . Coronary artery disease, non-occlusive January 2016    Moderate p&mLAD & Ostial OM; Myoview Nuclear Stress Test 03/21/2014: EF 35%. Mild hypokinesis of the inferior wall with fixed inferior defect suggesting scar. (no culprit lesion on CATH)  . 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 Critical lower limb ischemia     Status post left BKA following left popliteal and peroneal artery endarterectomy with patch angioplasty.  . Diabetic neuropathy ([place])   . Hyperlipidemia with target LDL less than 70   . History of CVA (cerebrovascular accident) 08/19/2013    "minor one"  . ESRD (end stage renal disease) ([place])     [address], [place], [place] (06/12/2014)  . Urinary retention     DX NEUROGENIC BLADDER--HAS INDWELLING FOLEY CATHETER  . Foley catheter in place     for urinary retention  . Anemia   . Hematemesis April 2016    Hospitalized  . Arthritis of knee, right     "right knee" (06/12/2014)  . Gout     "was in my LLE"  . Diabetes mellitus, insulin dependent (IDDM), uncontrolled ([place])     [place]; has h/o Hypo & Hyperglycemic episodes, Type II  . Foot infection   . Loss of consciousness 08/19/2013  . UTI (urinary tract infection) 10/17/2013  . Acute  hyperkalemia 12/24/2013    Past Surgical History  Procedure Laterality Date  . Insertion of suprapubic catheter N/A 05/16/2012    Procedure: INSERTION OF SUPRAPUBIC CATHETER;  Surgeon: Alexis Frock, MD;  Location: [facility];  Service: Urology;  Laterality: N/A;  . Bascilic vein transposition Left 10/30/2012    Procedure: LEFT 1ST STAGE BASCILIC VEIN TRANSPOSITION;  Surgeon: Angelia Mould, MD;  Location: [place];  Service: Vascular;  Laterality: Left;  . Nm myocar perf wall motion  04/19/2012/ 02/2014    a) low risk study-EF 44%,mild inferolateral hypkinesis; b) EF 35%. Mild hypokinesis of the inferior wall with fixed inferior defect suggesting scar.  . Bascilic vein transposition Left 02/05/2013    Procedure: BASCILIC VEIN TRANSPOSITION 2ND STAGE;  Surgeon: Angelia Mould, MD;  Location: [place];  Service: Vascular;  Laterality: Left;  . Radiology with anesthesia N/A 10/25/2013    Procedure: RADIOLOGY WITH ANESTHESIA;  Surgeon: Medication Radiologist, MD;  Location: [place];  Service: Radiology;  Laterality: N/A;  . Fistulogram Left 04/29/2013    Procedure: FISTULOGRAM;  Surgeon: Angelia Mould, MD;  Location: [facility] CATH LAB;  Service: Cardiovascular;  Laterality: Left;  . Angioplasty Left 04/29/2013    Procedure: ANGIOPLASTY;  Surgeon: Angelia Mould, MD;  Location: [facility] CATH LAB;  Service: Cardiovascular;  Laterality: Left;  . Abdominal aortagram N/A 03/19/2014  Procedure: ABDOMINAL AORTAGRAM;  Surgeon: Elam Dutch, MD;  Location: [facility] CATH LAB;  Service: Cardiovascular;  Laterality: N/A;  . Left and right heart catheterization with coronary angiogram N/A 03/24/2014    Procedure: LEFT AND RIGHT HEART CATHETERIZATION WITH CORONARY ANGIOGRAM;  Surgeon: Leonie Man, MD;  Location: [facility] CATH LAB;  Service: Cardiovascular; Moderate disease in p-mLAD & Ostial OM1; normal right heart pressures. Wedge pressure 29 mmHg. --No lesion to explain inferior defect on Myoview and echocardiogram.     . Endarterectomy popliteal Left 03/26/2014    Procedure: Left popliteal and peroneal artery endarterectomy with vein patch angioplasty;  Surgeon: Elam Dutch, MD;  Location: [place];  Service: Vascular;  Laterality: Left;  . Amputation Left 03/26/2014    Procedure: AMPUTATION SECOND LEFT TOE;  Surgeon: Elam Dutch, MD;  Location: [place];  Service: Vascular;  Laterality: Left;  . Av fistula placement Left 04/28/2014    Procedure: CREATION OF LEFT BRACHIOCEPHALIC  ARTERIOVENOUS (AV) FISTULA CREATION;  Surgeon: Conrad [place], MD;  Location: [place];  Service: Vascular;  Laterality: Left;  . Amputation Left 05/10/2014    Procedure: AMPUTATION BELOW KNEE;  Surgeon: Rosetta Posner, MD;  Location: [facility] OR;  Service: Vascular;  Laterality: Left;  . Esophagogastroduodenoscopy N/A 06/13/2014    Procedure: ESOPHAGOGASTRODUODENOSCOPY (EGD);  Surgeon: Carol Ada, MD;  Location: [facility] ENDOSCOPY;  Service: Endoscopy;  Laterality: N/A;  . Transthoracic echocardiogram  03/22/2014    EF 30-35%, mild LVH. Severe inferior hypokinesis. Mild MR.  Marland Kitchen Below knee leg amputation Left   . Fistulogram Left 10/08/2014    Procedure: LEFT ARM FISTULOGRAM;  Surgeon: Conrad [place], MD;  Location: [place];  Service: Vascular;  Laterality: Left;  . Venoplasty Left 10/08/2014    Procedure: LEFT CEPHALIC VEIN VENOPLASTY;  Surgeon: Conrad [place], MD;  Location: [place];  Service: Vascular;  Laterality: Left;  . Incision and drainage of wound N/A 10/29/2014    Procedure: IRRIGATION AND DEBRIDEMENT OF SACRAL WOUND, PLACEMENT OF A-CELL;  Surgeon: Theodoro Kos, DO;  Location: [place];  Service: Plastics;  Laterality: N/A;  . Application of a-cell of extremity N/A 10/29/2014    Procedure: PLACEMENT OF APPLICATION OF A-CELL;  Surgeon: Theodoro Kos, DO;  Location: [place];  Service: Plastics;  Laterality: N/A;  . Esophageal manometry N/A 11/24/2014    Procedure: ESOPHAGEAL MANOMETRY (EM);  Surgeon: Carol Ada, MD;  Location: [facility];  Service: Endoscopy;   Laterality: N/A;  . Upper gi endoscopy  03/06/2015    Procedure: UPPER GI ENDOSCOPY;  Surgeon: Michael Boston, MD;  Location: [facility];  Service: General;;    VITAL SIGNS BP 99/63 mmHg  Pulse 107  Temp(Src) 96.8 F (36 C) (Oral)  Resp 19  Ht 5' 3"  (1.6 m)  Wt 84 lb (38.102 kg)  BMI 14.88 kg/m2  SpO2 96%  Patient's Medications  New Prescriptions   No medications on file  Previous Medications   ASPIRIN EC 81 MG TABLET    Take 81 mg by mouth daily.   CETIRIZINE (ZYRTEC) 10 MG TABLET    Take 10 mg by mouth daily as needed for allergies.    FLUTICASONE (FLONASE) 50 MCG/ACT NASAL SPRAY    Place 1 spray into both nostrils 2 (two) times daily.   INSULIN ASPART (NOVOLOG) 100 UNIT/ML INJECTION    Inject 5-8 Units into the skin 3 (three) times daily before meals. 7 units with breakfast; 5 units with lunch; 8 units with supper   INSULIN  GLARGINE (LANTUS) 100 UNIT/ML INJECTION    Inject 6 Units into the skin every morning.   LIDOCAINE-PRILOCAINE (EMLA) CREAM    Apply 1 application topically Every Tuesday,Thursday,and Saturday with dialysis.    MULTIPLE VITAMINS-MINERALS (DECUBI-VITE) CAPS    Take 2 capsules by mouth daily.   OSELTAMIVIR (TAMIFLU) 30 MG CAPSULE    Take 30 mg by mouth daily.   OXYCODONE-ACETAMINOPHEN (PERCOCET/ROXICET) 5-325 MG TABLET    Take 1 tablet by mouth every 8 (eight) hours as needed for severe pain.   PANTOPRAZOLE (PROTONIX) 40 MG TABLET    Take 1 tablet (40 mg total) by mouth daily.   PRAVASTATIN (PRAVACHOL) 20 MG TABLET    Take 20 mg by mouth every evening.    PROMETHAZINE (PHENERGAN) 25 MG TABLET    Take 25 mg by mouth every 6 (six) hours as needed for nausea or vomiting.   SENNA (SENOKOT) 8.6 MG TABS TABLET    Take 1 tablet (8.6 mg total) by mouth daily as needed for mild constipation or moderate constipation.   SEVELAMER CARBONATE (RENVELA) 800 MG TABLET    Take 1 tablet (800 mg total) by mouth 3 (three) times daily with meals.  Modified Medications   No medications on  file  Discontinued Medications   ZINC SULFATE 220 MG CAPSULE    Take 220 mg by mouth daily. Reported on 05/06/2015     SIGNIFICANT DIAGNOSTIC EXAMS  03-22-14: 2-d echo: - Left ventricle: The cavity size was normal. Wall thickness was increased in a pattern of mild LVH. Systolic function was moderately to severely reduced. The estimated ejection fraction was in the range of 30% to 35%. Severe hypokinesis of the inferior myocardium. - Mitral valve: There was mild regurgitation. - Left atrium: The atrium was mildly dilated. - Pulmonary arteries: PA peak pressure: 31 mm Hg (S). - Pericardium, extracardiac: A trivial pericardial effusion was identified. There was a left pleural effusion.  05-04-14: chest x-ray: mild chf with bilateral air space disease and small bilateral pleural effusion   05-09-14: chest x-ray: 1. Stable mild cardiomegaly. 2. Decreased pleural effusions and improved aeration.  05-09-14: left foot x-ray: Acute osteomyelitis involving in the residual 2nd metatarsal, 3rd metatarsal head, and 3rd proximal phalanx.Associated pathologic fracture of the 3rd proximal phalanx.  03-08-15: barium swallow: 1. Narrow but patent gastroesophageal junction. Contrast does flows into the stomach but a large portion of the contrast remains within the patulous esophagus. Recommend caution in advancing diet. 2. No esophageal leak  03-09-15: kub: Normal small bowel gas pattern. Residual contrast material throughout the colon. No colonic distension. Degenerative changes lumbar spine  03-09-15: chest x-ray: Enlarged cardiac silhouette. Small bilateral pleural effusions. Airspace opacity in the right cardiophrenic angle, which likely represents focal airspace consolidation.      LABS REVIEWED:   05-09-14: wbc 8.5; hgb 11.;4 hct 37.1; mcv 87.1 plt 252; glucose 299; bun 43; creat 3.49; k+ 3.7; na++135; liver normal albumin 2.1; hgb a1c 7.0; tsh 1.242 05-11-14: wbc 9.7; hgb 10.8; hct 37.1; mcv 90.3; plt  213; glucose 114; bun 24; creat 3.12; k+4.5; na++136  05-12-14: wbc 11.9; hgb 10.4; hct 33.8; mcv 87.3 plt 225; glucose 151; bun 52; creat 4.52; k+5.0; na++135  06-28-14: wbc 5.9; hgb 11.8; hct 37.7; mcv 84.2; plt 103; glucose 86; bun 52; creat 3.74; k+5.5; na++134; albumin 2.2; phos 5.4  08-13-14: wbc 5.7 hgb 12.5 ;hct 40.5; mcv 84.6; plt 153; glucose 126; bun 80; creat 5.22; k+ 5.3; na++134; alk phos 145; ast 60; alt  83; albumin 3.0; hgb a1c 7.7; chol 146; ;ldl 44; trig 74; hdl 40 11-24-14: hgb a1c 7.0   03-06-15: wbc 5.2; hgb 11.2; hct 35.7; mcv 85.0; plt 190; glucose 107; bun 37; creat 3.31; k+ 4.6; na++139 hgb a1c 7.5  03-09-15: urine culture: yeast; blood culture: no growth 03-11-15: stool for c-diff: neg 03-12-15: wbc 5.3; hgb 9.8; hct 31.1; mcv 85.1; plt 144; glucose 144; bun 28; creat 4.17; k+ 4.5; na++130; phos 3.7;  albumin 2.0  03-18-15: glucose 66; bun 22; creat 3.19; k+ 4.5; na++137; alk phos 140; albumin 2.6; ca++ 7.7; chol 130; ldl 60; trig 90;  hdl 52  04-29-25: influenza swab: neg     Review of Systems  Constitutional: Negative for malaise/fatigue.  Respiratory: Positive for cough and sputum production. Negative for shortness of breath.        Clear sputum   Cardiovascular: Negative for chest pain, palpitations and leg swelling.  Gastrointestinal: Negative for heartburn, abdominal pain and constipation.  Musculoskeletal: Negative for myalgias, back pain and joint pain.  Skin: Negative.   Neurological: Negative for dizziness.  Psychiatric/Behavioral: The patient is not nervous/anxious.      Physical Exam Constitutional: No distress.  Frail   Eyes: Conjunctivae are normal.  Neck: Neck supple. No JVD present. No thyromegaly present.  Cardiovascular: Normal rate, regular rhythm and intact distal pulses.   Respiratory: Effort normal and breath sounds normal. No respiratory distress. She has no wheezes.  GI: Soft. Bowel sounds are normal. She exhibits no distension. There is no  tenderness.    Musculoskeletal: She exhibits no edema.  Able to move all extremities  Left bka   Lymphadenopathy:    She has no cervical adenopathy.  Neurological: She is alert.  Skin: Skin is warm and dry. She is not diaphoretic.  Psychiatric: She has a normal mood and affect.  Sacral wound 1 x 1.2 x 0.4 cm is being followed by wound clinic Has left upper extremity A/V fistula      ASSESSMENT/ PLAN:  1. ESRD: followed by nephrology; is on hemodialysis three days per week. Is on 1200 cc fluid restriction; will continue renvela 800 mg three times daily will monitor  2. Diabetes; hgb a1c is 7.5 will continue her lantus 6 units in the AM and 3 units nightly; will continue novolog 7 units with breakfast; 5 units with lunch and 8 units with supper. Will monitor  3. achalasia of esophagus status post Heller/Toupet on 03-06-15: will continue protonix 40 mg daily; will monitor  4. Dyslipidemia: will continue pravachol 20 mg daily  ldl is 60  5. Anemia of chronic disease: hgb is 9.8; will monitor   6. CAD: has 3 vessel disease is status post cath on 03-24-14: no complaint of chest pain present; will continue asa 81 mg daily   7. Chronic systolic heart failure: is presently stable her EF is 30%  8. Stage IV sacral ulceration: will continue current treatment is followed by wound clinic  9. PAD: will continue asa 81 mg daily is status post left bka  10. Protein calorie malnutrition: will continue supplements per facility protocol her current weight is 83 pounds; her albumin is 2.6  11. Allergic rhinitis: will change her zyrtec to 10 mg nightly on a routine basis; will begin mucinex dm twice daily for 10 days then twice daily as needed       Ok Edwards NP [facility]  Contact [phone] Monday through Friday 8am- 5pm  After hours call [phone]

## 2015-05-11 ENCOUNTER — Encounter (HOSPITAL_BASED_OUTPATIENT_CLINIC_OR_DEPARTMENT_OTHER): Payer: No Typology Code available for payment source

## 2015-05-20 ENCOUNTER — Encounter: Payer: Self-pay | Admitting: Internal Medicine

## 2015-05-20 LAB — HM DIABETES EYE EXAM

## 2015-06-11 ENCOUNTER — Encounter: Payer: Self-pay | Admitting: Adult Health

## 2015-06-11 ENCOUNTER — Non-Acute Institutional Stay (SKILLED_NURSING_FACILITY): Payer: Medicare Other | Admitting: Adult Health

## 2015-06-11 DIAGNOSIS — N186 End stage renal disease: Secondary | ICD-10-CM | POA: Diagnosis not present

## 2015-06-11 DIAGNOSIS — Z992 Dependence on renal dialysis: Secondary | ICD-10-CM | POA: Diagnosis not present

## 2015-06-11 DIAGNOSIS — L89154 Pressure ulcer of sacral region, stage 4: Secondary | ICD-10-CM | POA: Diagnosis not present

## 2015-06-11 DIAGNOSIS — K220 Achalasia of cardia: Secondary | ICD-10-CM | POA: Diagnosis not present

## 2015-06-11 DIAGNOSIS — E1122 Type 2 diabetes mellitus with diabetic chronic kidney disease: Secondary | ICD-10-CM | POA: Diagnosis not present

## 2015-06-11 DIAGNOSIS — I2510 Atherosclerotic heart disease of native coronary artery without angina pectoris: Secondary | ICD-10-CM

## 2015-06-11 DIAGNOSIS — L98429 Non-pressure chronic ulcer of back with unspecified severity: Secondary | ICD-10-CM

## 2015-06-11 DIAGNOSIS — E785 Hyperlipidemia, unspecified: Secondary | ICD-10-CM

## 2015-06-11 DIAGNOSIS — Z89512 Acquired absence of left leg below knee: Secondary | ICD-10-CM | POA: Diagnosis not present

## 2015-06-11 DIAGNOSIS — I5022 Chronic systolic (congestive) heart failure: Secondary | ICD-10-CM

## 2015-06-11 DIAGNOSIS — E1169 Type 2 diabetes mellitus with other specified complication: Secondary | ICD-10-CM | POA: Diagnosis not present

## 2015-06-11 LAB — BASIC METABOLIC PANEL: GLUCOSE: 102 mg/dL

## 2015-06-11 NOTE — Progress Notes (Signed)
Patient ID: Elizabeth [name], female   DOB: [birth year], 70 y.o.   MRN: [id number]   Facility: [facility]    CODE STATUS: FULL    No Known Allergies  Chief Complaint  Patient presents with  . Medical Management of Chronic Issues    Follow up    HPI:  She is a long term resident of this facility being seen for her annual exam. Overall she remains stable. Her swallowing is doing well; she is tolerating po without difficulty. She continues with hemodialysis three days per week. She is not voicing any concerns or complaints. There are no nursing concerns at this time.    Past Medical History  Diagnosis Date  . Coronary artery disease, non-occlusive January 2016    Moderate p&mLAD & Ostial OM; Myoview Nuclear Stress Test 03/21/2014: EF 35%. Mild hypokinesis of the inferior wall with fixed inferior defect suggesting scar. (no culprit lesion on CATH)  . 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 Critical lower limb ischemia     Status post left BKA following left popliteal and peroneal artery endarterectomy with patch angioplasty.  . Diabetic neuropathy ([place])   . Hyperlipidemia with target LDL less than 70   . History of CVA (cerebrovascular accident) 08/19/2013    "minor one"  . ESRD (end stage renal disease) ([place])     [address], [place], [place] (06/12/2014)  . Urinary retention     DX NEUROGENIC BLADDER--HAS INDWELLING FOLEY CATHETER  . Foley catheter in place     for urinary retention  . Anemia   . Hematemesis April 2016    Hospitalized  . Arthritis of knee, right     "right knee" (06/12/2014)  . Gout     "was in my LLE"  . Diabetes mellitus, insulin dependent (IDDM), uncontrolled ([place])     [place]; has h/o Hypo & Hyperglycemic episodes, Type II  . Foot infection   . Loss of consciousness 08/19/2013    . UTI (urinary tract infection) 10/17/2013  . Acute hyperkalemia 12/24/2013    Past Surgical History  Procedure Laterality Date  . Insertion of suprapubic catheter N/A 05/16/2012    Procedure: INSERTION OF SUPRAPUBIC CATHETER;  Surgeon: Alexis Frock, MD;  Location: [facility];  Service: Urology;  Laterality: N/A;  . Bascilic vein transposition Left 10/30/2012    Procedure: LEFT 1ST STAGE BASCILIC VEIN TRANSPOSITION;  Surgeon: Angelia Mould, MD;  Location: [place];  Service: Vascular;  Laterality: Left;  . Nm myocar perf wall motion  04/19/2012/ 02/2014    a) low risk study-EF 44%,mild inferolateral hypkinesis; b) EF 35%. Mild hypokinesis of the inferior wall with fixed inferior defect suggesting scar.  . Bascilic vein transposition Left 02/05/2013    Procedure: BASCILIC VEIN TRANSPOSITION 2ND STAGE;  Surgeon: Angelia Mould, MD;  Location: [place];  Service: Vascular;  Laterality: Left;  . Radiology with anesthesia N/A 10/25/2013    Procedure: RADIOLOGY WITH ANESTHESIA;  Surgeon: Medication Radiologist, MD;  Location: [place];  Service: Radiology;  Laterality: N/A;  . Fistulogram Left 04/29/2013    Procedure: FISTULOGRAM;  Surgeon: Angelia Mould, MD;  Location: [facility] CATH LAB;  Service: Cardiovascular;  Laterality: Left;  . Angioplasty Left 04/29/2013    Procedure: ANGIOPLASTY;  Surgeon: Angelia Mould, MD;  Location: [facility] CATH LAB;  Service: Cardiovascular;  Laterality: Left;  .  Abdominal aortagram N/A 03/19/2014    Procedure: ABDOMINAL Maxcine Ham;  Surgeon: Elam Dutch, MD;  Location: [facility] CATH LAB;  Service: Cardiovascular;  Laterality: N/A;  . Left and right heart catheterization with coronary angiogram N/A 03/24/2014    Procedure: LEFT AND RIGHT HEART CATHETERIZATION WITH CORONARY ANGIOGRAM;  Surgeon: Leonie Man, MD;  Location: [facility] LAB;  Service: Cardiovascular; Moderate disease in p-mLAD & Ostial OM1; normal right heart pressures. Wedge pressure 29 mmHg. --No lesion to  explain inferior defect on Myoview and echocardiogram.   . Endarterectomy popliteal Left 03/26/2014    Procedure: Left popliteal and peroneal artery endarterectomy with vein patch angioplasty;  Surgeon: Elam Dutch, MD;  Location: [place];  Service: Vascular;  Laterality: Left;  . Amputation Left 03/26/2014    Procedure: AMPUTATION SECOND LEFT TOE;  Surgeon: Elam Dutch, MD;  Location: [place];  Service: Vascular;  Laterality: Left;  . Av fistula placement Left 04/28/2014    Procedure: CREATION OF LEFT BRACHIOCEPHALIC  ARTERIOVENOUS (AV) FISTULA CREATION;  Surgeon: Conrad [place], MD;  Location: [place];  Service: Vascular;  Laterality: Left;  . Amputation Left 05/10/2014    Procedure: AMPUTATION BELOW KNEE;  Surgeon: Rosetta Posner, MD;  Location: [facility] OR;  Service: Vascular;  Laterality: Left;  . Esophagogastroduodenoscopy N/A 06/13/2014    Procedure: ESOPHAGOGASTRODUODENOSCOPY (EGD);  Surgeon: Carol Ada, MD;  Location: [facility] ENDOSCOPY;  Service: Endoscopy;  Laterality: N/A;  . Transthoracic echocardiogram  03/22/2014    EF 30-35%, mild LVH. Severe inferior hypokinesis. Mild MR.  Marland Kitchen Below knee leg amputation Left   . Fistulogram Left 10/08/2014    Procedure: LEFT ARM FISTULOGRAM;  Surgeon: Conrad Newport, MD;  Location: [place];  Service: Vascular;  Laterality: Left;  . Venoplasty Left 10/08/2014    Procedure: LEFT CEPHALIC VEIN VENOPLASTY;  Surgeon: Conrad , MD;  Location: [place];  Service: Vascular;  Laterality: Left;  . Incision and drainage of wound N/A 10/29/2014    Procedure: IRRIGATION AND DEBRIDEMENT OF SACRAL WOUND, PLACEMENT OF A-CELL;  Surgeon: Theodoro Kos, DO;  Location: [place];  Service: Plastics;  Laterality: N/A;  . Application of a-cell of extremity N/A 10/29/2014    Procedure: PLACEMENT OF APPLICATION OF A-CELL;  Surgeon: Theodoro Kos, DO;  Location: [place];  Service: Plastics;  Laterality: N/A;  . Esophageal manometry N/A 11/24/2014    Procedure: ESOPHAGEAL MANOMETRY (EM);  Surgeon: Carol Ada, MD;  [facility];  Service: Endoscopy;  Laterality: N/A;  . Upper gi endoscopy  03/06/2015    Procedure: UPPER GI ENDOSCOPY;  Surgeon: Michael Boston, MD;  Location: [facility];  Service: General;;    Family History  Problem Relation Age of Onset  . Hyperlipidemia Mother   . Hypertension Mother   . Hodgkin's lymphoma Father   . Heart disease Sister     Social History   Social History  . Marital Status: Single    Spouse Name: N/A  . Number of Children: N/A  . Years of Education: N/A   Occupational History  . Not on file.   Social History Main Topics  . Smoking status: Never Smoker   . Smokeless tobacco: Never Used  . Alcohol Use: No     Comment: "stopped drinking in 2013 drank a couple beers/day, 2 days/wk"  . Drug Use: No  . Sexual Activity: No   Other Topics Concern  . Not on file   Social History Narrative   Granddaughter is contact  Resident of [place]     Immunization History  Administered Date(s) Administered  . Influenza-Unspecified 11/21/2012, 12/22/2013, 11/22/2014  . Pneumococcal Polysaccharide-23 10/21/2013     VITAL SIGNS BP 90/58 mmHg  Pulse 98  Temp(Src) 96.8 F (36 C) (Oral)  Resp 16  Ht _0  (1.6 m)  Wt 82 lb (37.195 kg)  BMI 14.53 kg/m2  SpO2 96%  Patient's Medications  New Prescriptions   No medications on file  Previous Medications   ASCORBIC ACID (VITAMIN C) 500 MG TABLET    Take 500 mg by mouth 2 (two) times daily.   ASPIRIN EC 81 MG TABLET    Take 81 mg by mouth daily.   CETIRIZINE (ZYRTEC) 10 MG TABLET    Take 10 mg by mouth daily as needed for allergies.    FLUTICASONE (FLONASE) 50 MCG/ACT NASAL SPRAY    Place 1 spray into both nostrils 2 (two) times daily.   INSULIN ASPART (NOVOLOG) 100 UNIT/ML INJECTION    Inject 5-8 Units into the skin 3 (three) times daily before meals. 7 units with breakfast; 5 units with lunch; 8 units with supper   INSULIN GLARGINE (LANTUS) 100 UNIT/ML INJECTION    Inject 6 Units  into the skin every morning.   LIDOCAINE-PRILOCAINE (EMLA) CREAM    Apply 1 application topically Every Tuesday,Thursday,and Saturday with dialysis.    MULTIPLE VITAMINS-MINERALS (DECUBI-VITE) CAPS    Take 2 capsules by mouth daily.   OXYCODONE-ACETAMINOPHEN (PERCOCET/ROXICET) 5-325 MG TABLET    Take 1 tablet by mouth every 8 (eight) hours as needed for severe pain.   PANTOPRAZOLE (PROTONIX) 40 MG TABLET    Take 1 tablet (40 mg total) by mouth daily.   PRAVASTATIN (PRAVACHOL) 20 MG TABLET    Take 20 mg by mouth every evening.    PROMETHAZINE (PHENERGAN) 25 MG TABLET    Take 25 mg by mouth every 6 (six) hours as needed for nausea or vomiting.   SENNA (SENOKOT) 8.6 MG TABS TABLET    Take 1 tablet (8.6 mg total) by mouth daily as needed for mild constipation or moderate constipation.   SEVELAMER CARBONATE (RENVELA) 800 MG TABLET    Take 1 tablet (800 mg total) by mouth 3 (three) times daily with meals.   ZINC SULFATE 220 MG CAPSULE    Take 220 mg by mouth daily.  Modified Medications   No medications on file  Discontinued Medications     SIGNIFICANT DIAGNOSTIC EXAMS  03-22-14: 2-d echo: - Left ventricle: The cavity size was normal. Wall thickness was increased in a pattern of mild LVH. Systolic function was moderately to severely reduced. The estimated ejection fraction was in the range of 30% to 35%. Severe hypokinesis of the inferior myocardium. - Mitral valve: There was mild regurgitation. - Left atrium: The atrium was mildly dilated. - Pulmonary arteries: PA peak pressure: 31 mm Hg (S). - Pericardium, extracardiac: A trivial pericardial effusion was identified. There was a left pleural effusion.  05-04-14: chest x-ray: mild chf with bilateral air space disease and small bilateral pleural effusion   05-09-14: chest x-ray: 1. Stable mild cardiomegaly. 2. Decreased pleural effusions and improved aeration.  05-09-14: left foot x-ray: Acute osteomyelitis involving in the residual 2nd metatarsal,  3rd metatarsal head, and 3rd proximal phalanx.Associated pathologic fracture of the 3rd proximal phalanx.  03-08-15: barium swallow: 1. Narrow but patent gastroesophageal junction. Contrast does flows into the stomach but a large portion of the contrast remains within the patulous esophagus. Recommend caution  in advancing diet. 2. No esophageal leak  03-09-15: kub: Normal small bowel gas pattern. Residual contrast material throughout the colon. No colonic distension. Degenerative changes lumbar spine  03-09-15: chest x-ray: Enlarged cardiac silhouette. Small bilateral pleural effusions. Airspace opacity in the right cardiophrenic angle, which likely represents focal airspace consolidation.      LABS REVIEWED:   06-28-14: wbc 5.9; hgb 11.8; hct 37.7; mcv 84.2; plt 103; glucose 86; bun 52; creat 3.74; k+5.5; na++134; albumin 2.2; phos 5.4  08-13-14: wbc 5.7 hgb 12.5 ;hct 40.5; mcv 84.6; plt 153; glucose 126; bun 80; creat 5.22; k+ 5.3; na++134; alk phos 145; ast 60; alt 83; albumin 3.0; hgb a1c 7.7; chol 146; ;ldl 44; trig 74; hdl 40 11-24-14: hgb a1c 7.0   03-06-15: wbc 5.2; hgb 11.2; hct 35.7; mcv 85.0; plt 190; glucose 107; bun 37; creat 3.31; k+ 4.6; na++139 hgb a1c 7.5  03-09-15: urine culture: yeast; blood culture: no growth 03-11-15: stool for c-diff: neg 03-12-15: wbc 5.3; hgb 9.8; hct 31.1; mcv 85.1; plt 144; glucose 144; bun 28; creat 4.17; k+ 4.5; na++130; phos 3.7;  albumin 2.0  03-18-15: glucose 66; bun 22; creat 3.19; k+ 4.5; na++137; alk phos 140; albumin 2.6; ca++ 7.7; chol 130; ldl 60; trig 90; hdl 52  04-29-25: influenza swab: neg  05-25-15: pre-albumin 24     Review of Systems  Constitutional: Negative for malaise/fatigue.  Respiratory: negative for cough . Negative for shortness of breath.    Cardiovascular: Negative for chest pain, palpitations and leg swelling.  Gastrointestinal: Negative for heartburn, abdominal pain and constipation.  Musculoskeletal: Negative for myalgias,  back pain and joint pain.  Skin: Negative.   Neurological: Negative for dizziness.  Psychiatric/Behavioral: The patient is not nervous/anxious.      Physical Exam Constitutional: No distress.  Frail   Eyes: Conjunctivae are normal.  Neck: Neck supple. No JVD present. No thyromegaly present.  Cardiovascular: Normal rate, regular rhythm and intact distal pulses.   Respiratory: Effort normal and breath sounds normal. No respiratory distress. She has no wheezes.  GI: Soft. Bowel sounds are normal. She exhibits no distension. There is no tenderness.    Musculoskeletal: She exhibits no edema.  Able to move all extremities  Left bka   Lymphadenopathy:    She has no cervical adenopathy.  Neurological: She is alert.  Skin: Skin is warm and dry. She is not diaphoretic.  Psychiatric: She has a normal mood and affect.  Sacral wound 1.2 x 2 x 0.5 cm is being followed by wound clinic Has left upper extremity A/V fistula      ASSESSMENT/ PLAN:  1. ESRD: followed by nephrology; is on hemodialysis three days per week. Is on 1200 cc fluid restriction; will continue renvela 800 mg three times daily will monitor  2. Diabetes; hgb a1c is 7.5 will continue her lantus 6 units in the AM and 3 units nightly; will continue novolog 7 units with breakfast; 5 units with lunch and 8 units with supper. Will monitor  3. achalasia of esophagus status post Heller/Toupet on 03-06-15: will continue protonix 40 mg daily; will monitor  4. Dyslipidemia: will continue pravachol 20 mg daily  ldl is 60  5. Anemia of chronic disease: hgb is 9.8; will monitor   6. CAD: has 3 vessel disease is status post cath on 03-24-14: no complaint of chest pain present; will continue asa 81 mg daily   7. Chronic systolic heart failure: is presently stable her EF is 30%-35%; without symptoms  of fluids overload present.   8. Stage IV sacral ulceration: will continue current treatment is followed by wound clinic  9. PAD: will  continue asa 81 mg daily is status post left bka  10. Protein calorie malnutrition: will continue supplements per facility protocol her current weight is 83 pounds; her albumin is 2.6  11. Allergic rhinitis: will continue  zyrtec to 10 mg nightly as needed    Will check hgb a1c  Will setup screening mammogram; dexa and cologuard    Time spent with patient  45 minutes >50% time spent counseling; reviewing medical record; tests; labs; and developing future plan of care       Ok Edwards NP [facility]  Contact [phone] Monday through Friday 8am- 5pm  After hours call [phone]

## 2015-06-12 LAB — HEMOGLOBIN A1C: HEMOGLOBIN A1C: 7.4

## 2015-06-25 ENCOUNTER — Encounter: Payer: Self-pay | Admitting: Adult Health

## 2015-06-25 NOTE — Progress Notes (Signed)
This encounter was created in error - please disregard.

## 2015-07-08 ENCOUNTER — Non-Acute Institutional Stay (SKILLED_NURSING_FACILITY): Payer: Medicare Other | Admitting: Adult Health

## 2015-07-08 ENCOUNTER — Encounter: Payer: Self-pay | Admitting: Adult Health

## 2015-07-08 DIAGNOSIS — E1122 Type 2 diabetes mellitus with diabetic chronic kidney disease: Secondary | ICD-10-CM | POA: Diagnosis not present

## 2015-07-08 DIAGNOSIS — E785 Hyperlipidemia, unspecified: Secondary | ICD-10-CM

## 2015-07-08 DIAGNOSIS — E1169 Type 2 diabetes mellitus with other specified complication: Secondary | ICD-10-CM | POA: Diagnosis not present

## 2015-07-08 DIAGNOSIS — Z89512 Acquired absence of left leg below knee: Secondary | ICD-10-CM

## 2015-07-08 DIAGNOSIS — I2510 Atherosclerotic heart disease of native coronary artery without angina pectoris: Secondary | ICD-10-CM | POA: Diagnosis not present

## 2015-07-08 DIAGNOSIS — L89154 Pressure ulcer of sacral region, stage 4: Secondary | ICD-10-CM | POA: Diagnosis not present

## 2015-07-08 DIAGNOSIS — D638 Anemia in other chronic diseases classified elsewhere: Secondary | ICD-10-CM

## 2015-07-08 DIAGNOSIS — N186 End stage renal disease: Secondary | ICD-10-CM | POA: Diagnosis not present

## 2015-07-08 DIAGNOSIS — K220 Achalasia of cardia: Secondary | ICD-10-CM

## 2015-07-08 DIAGNOSIS — I5022 Chronic systolic (congestive) heart failure: Secondary | ICD-10-CM

## 2015-07-08 DIAGNOSIS — Z992 Dependence on renal dialysis: Secondary | ICD-10-CM

## 2015-07-08 DIAGNOSIS — L98429 Non-pressure chronic ulcer of back with unspecified severity: Secondary | ICD-10-CM

## 2015-07-08 NOTE — Progress Notes (Signed)
Patient ID: Elizabeth [name], female   DOB: [birth year], 70 y.o.   MRN: [id number]    Location:  [facility] Room Number: 103-A Place of Service:  SNF (31)   CODE STATUS: Full Code  No Known Allergies  Chief Complaint  Patient presents with  . Medical Management of Chronic Issues    Follow up    HPI:  She is a long term resident of this facility being seen for the management of her chronic illnesses. Overall there is little change in her status. She will decline her insulin at times when she feels concerned that she will have a low cbg. There are no nursing concerns at this time.    Past Medical History  Diagnosis Date  . Coronary artery disease, non-occlusive January 2016    Moderate p&mLAD & Ostial OM; Myoview Nuclear Stress Test 03/21/2014: EF 35%. Mild hypokinesis of the inferior wall with fixed inferior defect suggesting scar. (no culprit lesion on CATH)  . 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 Critical lower limb ischemia     Status post left BKA following left popliteal and peroneal artery endarterectomy with patch angioplasty.  . Diabetic neuropathy ([place])   . Hyperlipidemia with target LDL less than 70   . History of CVA (cerebrovascular accident) 08/19/2013    "minor one"  . ESRD (end stage renal disease) ([place])     [address], [place], S (06/12/2014)  . Urinary retention     DX NEUROGENIC BLADDER--HAS INDWELLING FOLEY CATHETER  . Foley catheter in place     for urinary retention  . Anemia   . Hematemesis April 2016    Hospitalized  . Arthritis of knee, right     "right knee" (06/12/2014)  . Gout     "was in my LLE"  . Diabetes mellitus, insulin dependent (IDDM), uncontrolled ([place])     Brittle; has h/o Hypo & Hyperglycemic episodes, Type II  . Foot  infection   . Loss of consciousness 08/19/2013  . UTI (urinary tract infection) 10/17/2013  . Acute hyperkalemia 12/24/2013    Past Surgical History  Procedure Laterality Date  . Insertion of suprapubic catheter N/A 05/16/2012    Procedure: INSERTION OF SUPRAPUBIC CATHETER;  Surgeon: Alexis Frock, MD;  Location: [facility];  Service: Urology;  Laterality: N/A;  . Bascilic vein transposition Left 10/30/2012    Procedure: LEFT 1ST STAGE BASCILIC VEIN TRANSPOSITION;  Surgeon: Angelia Mould, MD;  Location: [place];  Service: Vascular;  Laterality: Left;  . Nm myocar perf wall motion  04/19/2012/ 02/2014    a) low risk study-EF 44%,mild inferolateral hypkinesis; b) EF 35%. Mild hypokinesis of the inferior wall with fixed inferior defect suggesting scar.  . Bascilic vein transposition Left 02/05/2013    Procedure: BASCILIC VEIN TRANSPOSITION 2ND STAGE;  Surgeon: Angelia Mould, MD;  Location: [place];  Service: Vascular;  Laterality: Left;  . Radiology with anesthesia N/A 10/25/2013    Procedure: RADIOLOGY WITH ANESTHESIA;  Surgeon: Medication Radiologist, MD;  Location: [place];  Service: Radiology;  Laterality: N/A;  . Fistulogram Left 04/29/2013    Procedure: FISTULOGRAM;  Surgeon: Angelia Mould, MD;  Location: [facility] CATH LAB;  Service: Cardiovascular;  Laterality: Left;  . Angioplasty Left 04/29/2013    Procedure: ANGIOPLASTY;  Surgeon: Angelia Mould, MD;  Location: [place] CATH LAB;  Service: Cardiovascular;  Laterality: Left;  . Abdominal aortagram N/A 03/19/2014    Procedure: ABDOMINAL Maxcine Ham;  Surgeon: Elam Dutch, MD;  Location: [facility] CATH LAB;  Service: Cardiovascular;  Laterality: N/A;  . Left and right heart catheterization with coronary angiogram N/A 03/24/2014    Procedure: LEFT AND RIGHT HEART CATHETERIZATION WITH CORONARY ANGIOGRAM;  Surgeon: Leonie Man, MD;  Location: [facility] CATH LAB;  Service: Cardiovascular; Moderate disease in p-mLAD & Ostial OM1; normal right heart  pressures. Wedge pressure 29 mmHg. --No lesion to explain inferior defect on Myoview and echocardiogram.   . Endarterectomy popliteal Left 03/26/2014    Procedure: Left popliteal and peroneal artery endarterectomy with vein patch angioplasty;  Surgeon: Elam Dutch, MD;  Location: [place];  Service: Vascular;  Laterality: Left;  . Amputation Left 03/26/2014    Procedure: AMPUTATION SECOND LEFT TOE;  Surgeon: Elam Dutch, MD;  Location: [place];  Service: Vascular;  Laterality: Left;  . Av fistula placement Left 04/28/2014    Procedure: CREATION OF LEFT BRACHIOCEPHALIC  ARTERIOVENOUS (AV) FISTULA CREATION;  Surgeon: Conrad [place], MD;  Location: [place];  Service: Vascular;  Laterality: Left;  . Amputation Left 05/10/2014    Procedure: AMPUTATION BELOW KNEE;  Surgeon: Rosetta Posner, MD;  Location: [facility];  Service: Vascular;  Laterality: Left;  . Esophagogastroduodenoscopy N/A 06/13/2014    Procedure: ESOPHAGOGASTRODUODENOSCOPY (EGD);  Surgeon: Carol Ada, MD;  Location[facility] ENDOSCOPY;  Service: Endoscopy;  Laterality: N/A;  . Transthoracic echocardiogram  03/22/2014    EF 30-35%, mild LVH. Severe inferior hypokinesis. Mild MR.  Marland Kitchen Below knee leg amputation Left   . Fistulogram Left 10/08/2014    Procedure: LEFT ARM FISTULOGRAM;  Surgeon: Conrad Seab[place], MD;  Location: [place];  Service: Vascular;  Laterality: Left;  . Venoplasty Left 10/08/2014    Procedure: LEFT CEPHALIC VEIN VENOPLASTY;  Surgeon: Conrad , MD;  Location: [place];  Service: Vascular;  Laterality: Left;  . Incision and drainage of wound N/A 10/29/2014    Procedure: IRRIGATION AND DEBRIDEMENT OF SACRAL WOUND, PLACEMENT OF A-CELL;  Surgeon: Theodoro Kos, DO;  Location: [place];  Service: Plastics;  Laterality: N/A;  . Application of a-cell of extremity N/A 10/29/2014    Procedure: PLACEMENT OF APPLICATION OF A-CELL;  Surgeon: Theodoro Kos, DO;  Location: [place];  Service: Plastics;  Laterality: N/A;  . Esophageal manometry N/A 11/24/2014     Procedure: ESOPHAGEAL MANOMETRY (EM);  Surgeon: Carol Ada, MD;  [facility];  Service: Endoscopy;  Laterality: N/A;  . Upper gi endoscopy  03/06/2015    Procedure: UPPER GI ENDOSCOPY;  Surgeon: Michael [place], MD;  Location: [facility];  Service: General;;    Social History   Social History  . Marital Status: Single    Spouse Name: N/A  . Number of Children: N/A  . Years of Education: N/A   Occupational History  . Not on file.   Social History Main Topics  . Smoking status: Never Smoker   . Smokeless tobacco: Never Used  . Alcohol Use: No     Comment: "stopped drinking in 2013 drank a couple beers/day, 2 days/wk"  . Drug Use: No  . Sexual Activity: No   Other Topics Concern  . Not on file   Social History Narrative   Granddaughter is contact      Resident of [facility]- SNF    Family History  Problem Relation Age of Onset  .  Hyperlipidemia Mother   . Hypertension Mother   . Hodgkin's lymphoma Father   . Heart disease Sister       VITAL SIGNS BP 102/56 mmHg  Pulse 87  Temp(Src) 97 F (36.1 C) (Oral)  Resp 18  Ht _0  (1.6 m)  Wt 83 lb 8 oz (37.875 kg)  BMI 14.79 kg/m2  SpO2 96%  Patient's Medications  New Prescriptions   No medications on file  Previous Medications   ASCORBIC ACID (VITAMIN C) 500 MG TABLET    Take 500 mg by mouth 2 (two) times daily.   ASPIRIN EC 81 MG TABLET    Take 81 mg by mouth daily.   CETIRIZINE (ZYRTEC) 10 MG TABLET    Take 10 mg by mouth daily as needed for allergies.    FLUTICASONE (FLONASE) 50 MCG/ACT NASAL SPRAY    Place 1 spray into both nostrils 2 (two) times daily.   INSULIN ASPART (NOVOLOG) 100 UNIT/ML INJECTION    Inject as per sliding scale: if 0-69 =0 units; 70+ = 7 units, subcutaneously before meals related to DM.   INSULIN GLARGINE (LANTUS) 100 UNIT/ML INJECTION    Inject 6 Units into the skin every morning.   LIDOCAINE-PRILOCAINE (EMLA) CREAM    Apply 1 application topically Every Tuesday,Thursday,and  Saturday with dialysis.    MULTIPLE VITAMINS-MINERALS (DECUBI-VITE) CAPS    Take 2 capsules by mouth daily.   NUTRITIONAL SUPPLEMENTS (NUTRITIONAL SUPPLEMENT PO)    Take by mouth. Prostat 60 cc TID Mech Soft texture   OXYCODONE-ACETAMINOPHEN (PERCOCET/ROXICET) 5-325 MG TABLET    Take 1 tablet by mouth every 8 (eight) hours as needed for severe pain.   PANTOPRAZOLE (PROTONIX) 40 MG TABLET    Take 1 tablet (40 mg total) by mouth daily.   PRAVASTATIN (PRAVACHOL) 20 MG TABLET    Take 20 mg by mouth every evening.    PROMETHAZINE (PHENERGAN) 25 MG TABLET    Take 25 mg by mouth every 6 (six) hours as needed for nausea or vomiting.   SENNA (SENOKOT) 8.6 MG TABS TABLET    Take 1 tablet (8.6 mg total) by mouth daily as needed for mild constipation or moderate constipation.   SEVELAMER CARBONATE (RENVELA) 800 MG TABLET    Take 1 tablet (800 mg total) by mouth 3 (three) times daily with meals.  Modified Medications   No medications on file  Discontinued Medications   No medications on file     SIGNIFICANT DIAGNOSTIC EXAMS  03-22-14: 2-d echo: - Left ventricle: The cavity size was normal. Wall thickness was increased in a pattern of mild LVH. Systolic function was moderately to severely reduced. The estimated ejection fraction was in the range of 30% to 35%. Severe hypokinesis of the inferior myocardium. - Mitral valve: There was mild regurgitation. - Left atrium: The atrium was mildly dilated. - Pulmonary arteries: PA peak pressure: 31 mm Hg (S). - Pericardium, extracardiac: A trivial pericardial effusion was identified. There was a left pleural effusion.  05-04-14: chest x-ray: mild chf with bilateral air space disease and small bilateral pleural effusion   05-09-14: chest x-ray: 1. Stable mild cardiomegaly. 2. Decreased pleural effusions and improved aeration.  05-09-14: left foot x-ray: Acute osteomyelitis involving in the residual 2nd metatarsal, 3rd metatarsal head, and 3rd proximal  phalanx.Associated pathologic fracture of the 3rd proximal phalanx.  03-08-15: barium swallow: 1. Narrow but patent gastroesophageal junction. Contrast does flows into the stomach but a large portion of the contrast remains within the patulous esophagus.  Recommend caution in advancing diet. 2. No esophageal leak  03-09-15: kub: Normal small bowel gas pattern. Residual contrast material throughout the colon. No colonic distension. Degenerative changes lumbar spine  03-09-15: chest x-ray: Enlarged cardiac silhouette. Small bilateral pleural effusions. Airspace opacity in the right cardiophrenic angle, which likely represents focal airspace consolidation.      LABS REVIEWED:   08-13-14: wbc 5.7 hgb 12.5 ;hct 40.5; mcv 84.6; plt 153; glucose 126; bun 80; creat 5.22; k+ 5.3; na++134; alk phos 145; ast 60; alt 83; albumin 3.0; hgb a1c 7.7; chol 146; ;ldl 44; trig 74; hdl 40 11-24-14: hgb a1c 7.0   03-06-15: wbc 5.2; hgb 11.2; hct 35.7; mcv 85.0; plt 190; glucose 107; bun 37; creat 3.31; k+ 4.6; na++139 hgb a1c 7.5  03-09-15: urine culture: yeast; blood culture: no growth 03-11-15: stool for c-diff: neg 03-12-15: wbc 5.3; hgb 9.8; hct 31.1; mcv 85.1; plt 144; glucose 144; bun 28; creat 4.17; k+ 4.5; na++130; phos 3.7;  albumin 2.0  03-18-15: glucose 66; bun 22; creat 3.19; k+ 4.5; na++137; alk phos 140; albumin 2.6; ca++ 7.7; chol 130; ldl 60; trig 90; hdl 52  04-29-25: influenza swab: neg  05-25-15: pre-albumin 24  06-12-15: hgb a1c 7.4     Review of Systems  Constitutional: Negative for malaise/fatigue.  Respiratory: negative for cough . Negative for shortness of breath.    Cardiovascular: Negative for chest pain, palpitations and leg swelling.  Gastrointestinal: Negative for heartburn, abdominal pain and constipation.  Musculoskeletal: Negative for myalgias, back pain and joint pain.  Skin: Negative.   Neurological: Negative for dizziness.  Psychiatric/Behavioral: The patient is not nervous/anxious.       Physical Exam Constitutional: No distress.  Frail   Eyes: Conjunctivae are normal.  Neck: Neck supple. No JVD present. No thyromegaly present.  Cardiovascular: Normal rate, regular rhythm and intact distal pulses.   Respiratory: Effort normal and breath sounds normal. No respiratory distress. She has no wheezes.  GI: Soft. Bowel sounds are normal. She exhibits no distension. There is no tenderness.    Musculoskeletal: She exhibits no edema.  Able to move all extremities  Left bka Has foley    Lymphadenopathy:    She has no cervical adenopathy.  Neurological: She is alert.  Skin: Skin is warm and dry. She is not diaphoretic.  Psychiatric: She has a normal mood and affect.  Sacral wound followed by wound clinic Has left upper extremity A/V fistula +thrill + bruit       ASSESSMENT/ PLAN:  1. ESRD: followed by nephrology; is on hemodialysis three days per week. Is on 1200 cc fluid restriction; will continue renvela 800 mg three times daily will monitor  2. Diabetes; hgb a1c is 7.4 will continue her lantus 6 units in the AM; will continue novolog 7 units with meals . Will monitor  3. achalasia of esophagus status post Heller/Toupet on 03-06-15: will continue protonix 40 mg daily; will monitor  4. Dyslipidemia: will continue pravachol 20 mg daily  ldl is 60  5. Anemia of chronic disease: hgb is 9.8; will monitor   6. CAD: has 3 vessel disease is status post cath on 03-24-14: no complaint of chest pain present; will continue asa 81 mg daily   7. Chronic systolic heart failure: is presently stable her EF is 30%-35%; without symptoms of fluid overload present.   8. Stage IV sacral ulceration: will continue current treatment is followed by wound clinic  9. PAD: will continue asa 81 mg daily is  status post left bka  10. Protein calorie malnutrition: will continue supplements per facility protocol her current weight is 83.5  pounds; her albumin is 2.6  11. Allergic rhinitis: will  continue  zyrtec to 10 mg nightly as needed   12. Urine retention: has long term foley    Will check cbc; cmp; lipids    Ok Edwards NP [facility]  Contact [phone] Monday through Friday 8am- 5pm  After hours call [phone]

## 2015-07-10 LAB — LIPID PANEL
Cholesterol: 186 mg/dL (ref 0–200)
HDL: 76 mg/dL — AB (ref 35–70)
LDL CALC: 90 mg/dL
LDL/HDL RATIO: 2.4
TRIGLYCERIDES: 101 mg/dL (ref 40–160)

## 2015-07-10 LAB — BASIC METABOLIC PANEL
BUN: 18 mg/dL (ref 4–21)
CREATININE: 3 mg/dL — AB (ref 0.5–1.1)
Glucose: 118 mg/dL
POTASSIUM: 3.7 mmol/L (ref 3.4–5.3)
SODIUM: 135 mmol/L — AB (ref 137–147)

## 2015-07-10 LAB — HEPATIC FUNCTION PANEL
ALT: 32 U/L (ref 7–35)
AST: 27 U/L (ref 13–35)
Alkaline Phosphatase: 97 U/L (ref 25–125)
BILIRUBIN, TOTAL: 0.3 mg/dL

## 2015-08-06 ENCOUNTER — Non-Acute Institutional Stay (SKILLED_NURSING_FACILITY): Payer: Medicare Other | Admitting: Adult Health

## 2015-08-06 ENCOUNTER — Encounter: Payer: Self-pay | Admitting: Adult Health

## 2015-08-06 DIAGNOSIS — E1122 Type 2 diabetes mellitus with diabetic chronic kidney disease: Secondary | ICD-10-CM | POA: Diagnosis not present

## 2015-08-06 DIAGNOSIS — Z89512 Acquired absence of left leg below knee: Secondary | ICD-10-CM

## 2015-08-06 DIAGNOSIS — E785 Hyperlipidemia, unspecified: Secondary | ICD-10-CM

## 2015-08-06 DIAGNOSIS — I2510 Atherosclerotic heart disease of native coronary artery without angina pectoris: Secondary | ICD-10-CM

## 2015-08-06 DIAGNOSIS — K220 Achalasia of cardia: Secondary | ICD-10-CM

## 2015-08-06 DIAGNOSIS — I5032 Chronic diastolic (congestive) heart failure: Secondary | ICD-10-CM

## 2015-08-06 DIAGNOSIS — E1169 Type 2 diabetes mellitus with other specified complication: Secondary | ICD-10-CM | POA: Diagnosis not present

## 2015-08-06 DIAGNOSIS — N186 End stage renal disease: Secondary | ICD-10-CM | POA: Diagnosis not present

## 2015-08-06 DIAGNOSIS — L89154 Pressure ulcer of sacral region, stage 4: Secondary | ICD-10-CM

## 2015-08-06 DIAGNOSIS — Z992 Dependence on renal dialysis: Secondary | ICD-10-CM

## 2015-08-06 DIAGNOSIS — I739 Peripheral vascular disease, unspecified: Secondary | ICD-10-CM

## 2015-08-06 DIAGNOSIS — E43 Unspecified severe protein-calorie malnutrition: Secondary | ICD-10-CM | POA: Diagnosis not present

## 2015-08-06 DIAGNOSIS — L98429 Non-pressure chronic ulcer of back with unspecified severity: Secondary | ICD-10-CM

## 2015-08-06 DIAGNOSIS — D638 Anemia in other chronic diseases classified elsewhere: Secondary | ICD-10-CM

## 2015-08-06 NOTE — Progress Notes (Signed)
Patient ID: Elizabeth [name], female   DOB: [birth year], 70 y.o.   MRN: [id number]   Location:  [facility] Room Number: 103-A Place of Service:  SNF (31)   CODE STATUS: Full Code  No Known Allergies  Chief Complaint  Patient presents with  . Medical Management of Chronic Issues    Follow up    HPI:  She is a long term resident of this facility being seen for the management of her chronic illnesses. She is not feeling good today. She tells me that her stomach is upset and has some mild pain present. She tells me that this will happen on occasions; but is better since her procedure in Jan. She says usually resting for a couple of hours will help her significantly. She does not want any further interventions at this time. There no reports of changes in appetite or fevers present.    Past Medical History  Diagnosis Date  . Coronary artery disease, non-occlusive January 2016    Moderate p&mLAD & Ostial OM; Myoview Nuclear Stress Test 03/21/2014: EF 35%. Mild hypokinesis of the inferior wall with fixed inferior defect suggesting scar. (no culprit lesion on CATH)  . 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 Critical lower limb ischemia     Status post left BKA following left popliteal and peroneal artery endarterectomy with patch angioplasty.  . Diabetic neuropathy ([place])   . Hyperlipidemia with target LDL less than 70   . History of CVA (cerebrovascular accident) 08/19/2013    "minor one"  . ESRD (end stage renal disease) ([place])     [address], [place], [place] (06/12/2014)  . Urinary retention     DX NEUROGENIC BLADDER--HAS INDWELLING FOLEY CATHETER  . Foley catheter in place     for urinary retention  . Anemia   . Hematemesis April 2016    Hospitalized  . Arthritis of knee,  right     "right knee" (06/12/2014)  . Gout     "was in my LLE"  . Diabetes mellitus, insulin dependent (IDDM), uncontrolled ([place])     Brittle; has h/o Hypo & Hyperglycemic episodes, Type II  . Foot infection   . Loss of consciousness 08/19/2013  . UTI (urinary tract infection) 10/17/2013  . Acute hyperkalemia 12/24/2013    Past Surgical History  Procedure Laterality Date  . Insertion of suprapubic catheter N/A 05/16/2012    Procedure: INSERTION OF SUPRAPUBIC CATHETER;  Surgeon: Alexis Frock, MD;  Location: [facility];  Service: Urology;  Laterality: N/A;  . Bascilic vein transposition Left 10/30/2012    Procedure: LEFT 1ST STAGE BASCILIC VEIN TRANSPOSITION;  Surgeon: Angelia Mould, MD;  Location: [place];  Service: Vascular;  Laterality: Left;  . Nm myocar perf wall motion  04/19/2012/ 02/2014    a) low risk study-EF 44%,mild inferolateral hypkinesis; b) EF 35%. Mild hypokinesis of the inferior wall with fixed inferior defect suggesting scar.  . Bascilic vein transposition Left 02/05/2013    Procedure: BASCILIC VEIN TRANSPOSITION 2ND STAGE;  Surgeon: Angelia Mould, MD;  Location: [place];  Service: Vascular;  Laterality: Left;  . Radiology with anesthesia N/A 10/25/2013    Procedure: RADIOLOGY WITH ANESTHESIA;  Surgeon: Medication Radiologist, MD;  Location: [place];  Service: Radiology;  Laterality: N/A;  . Fistulogram Left 04/29/2013    Procedure:  FISTULOGRAM;  Surgeon: Angelia Mould, MD;  Location: [facility] CATH LAB;  Service: Cardiovascular;  Laterality: Left;  . Angioplasty Left 04/29/2013    Procedure: ANGIOPLASTY;  Surgeon: Angelia Mould, MD;  Location: [facility] CATH LAB;  Service: Cardiovascular;  Laterality: Left;  . Abdominal aortagram N/A 03/19/2014    Procedure: ABDOMINAL Maxcine Ham;  Surgeon: Elam Dutch, MD;  Location: [facility] CATH LAB;  Service: Cardiovascular;  Laterality: N/A;  . Left and right heart catheterization with coronary angiogram N/A 03/24/2014    Procedure: LEFT  AND RIGHT HEART CATHETERIZATION WITH CORONARY ANGIOGRAM;  Surgeon: Leonie Man, MD;  Location: [facility] CATH LAB;  Service: Cardiovascular; Moderate disease in p-mLAD & Ostial OM1; normal right heart pressures. Wedge pressure 29 mmHg. --No lesion to explain inferior defect on Myoview and echocardiogram.   . Endarterectomy popliteal Left 03/26/2014    Procedure: Left popliteal and peroneal artery endarterectomy with vein patch angioplasty;  Surgeon: Elam Dutch, MD;  Location: [place];  Service: Vascular;  Laterality: Left;  . Amputation Left 03/26/2014    Procedure: AMPUTATION SECOND LEFT TOE;  Surgeon: Elam Dutch, MD;  Location: [place];  Service: Vascular;  Laterality: Left;  . Av fistula placement Left 04/28/2014    Procedure: CREATION OF LEFT BRACHIOCEPHALIC  ARTERIOVENOUS (AV) FISTULA CREATION;  Surgeon: Conrad Beatrice, MD;  Location: [place];  Service: Vascular;  Laterality: Left;  . Amputation Left 05/10/2014    Procedure: AMPUTATION BELOW KNEE;  Surgeon: Rosetta Posner, MD;  Location: [facility] OR;  Service: Vascular;  Laterality: Left;  . Esophagogastroduodenoscopy N/A 06/13/2014    Procedure: ESOPHAGOGASTRODUODENOSCOPY (EGD);  Surgeon: Carol Ada, MD;  Location: [facility] ENDOSCOPY;  Service: Endoscopy;  Laterality: N/A;  . Transthoracic echocardiogram  03/22/2014    EF 30-35%, mild LVH. Severe inferior hypokinesis. Mild MR.  Marland Kitchen Below knee leg amputation Left   . Fistulogram Left 10/08/2014    Procedure: LEFT ARM FISTULOGRAM;  Surgeon: Conrad [place], MD;  Location: [place];  Service: Vascular;  Laterality: Left;  . Venoplasty Left 10/08/2014    Procedure: LEFT CEPHALIC VEIN VENOPLASTY;  Surgeon: Conrad Kim, MD;  Location: [place];  Service: Vascular;  Laterality: Left;  . Incision and drainage of wound N/A 10/29/2014    Procedure: IRRIGATION AND DEBRIDEMENT OF SACRAL WOUND, PLACEMENT OF A-CELL;  Surgeon: Theodoro Kos, DO;  Location: [place];  Service: Plastics;  Laterality: N/A;  . Application of a-cell of extremity  N/A 10/29/2014    Procedure: PLACEMENT OF APPLICATION OF A-CELL;  Surgeon: Theodoro Kos, DO;  Location: [place];  Service: Plastics;  Laterality: N/A;  . Esophageal manometry N/A 11/24/2014    Procedure: ESOPHAGEAL MANOMETRY (EM);  Surgeon: Carol Ada, MD;  [facility];  Service: Endoscopy;  Laterality: N/A;  . Upper gi endoscopy  03/06/2015    Procedure: UPPER GI ENDOSCOPY;  Surgeon: Michael Boston, MD;  Location: [facility];  Service: General;;    Social History   Social History  . Marital Status: Single    Spouse Name: N/A  . Number of Children: N/A  . Years of Education: N/A   Occupational History  . Not on file.   Social History Main Topics  . Smoking status: Never Smoker   . Smokeless tobacco: Never Used  . Alcohol Use: No     Comment: "stopped drinking in 2013 drank a couple beers/day, 2 days/wk"  . Drug Use: No  . Sexual Activity: No   Other Topics Concern  . Not  on file   Social History Narrative   Granddaughter is contact      Resident of [facility]- SNF    Family History  Problem Relation Age of Onset  . Hyperlipidemia Mother   . Hypertension Mother   . Hodgkin's lymphoma Father   . Heart disease Sister       VITAL SIGNS BP 112/60 mmHg  Pulse 82  Temp(Src) 98 F (36.7 C) (Oral)  Resp 16  Ht 5' 3"  (1.6 m)  Wt 84 lb (38.102 kg)  BMI 14.88 kg/m2  SpO2 96%  Patient's Medications  New Prescriptions   No medications on file  Previous Medications   ASCORBIC ACID (VITAMIN C) 500 MG TABLET    Take 500 mg by mouth 2 (two) times daily.   ASPIRIN EC 81 MG TABLET    Take 81 mg by mouth daily.   CETIRIZINE (ZYRTEC) 10 MG TABLET    Take 10 mg by mouth daily as needed for allergies.    FLUTICASONE (FLONASE) 50 MCG/ACT NASAL SPRAY    Place 1 spray into both nostrils 2 (two) times daily.   INSULIN ASPART (NOVOLOG) 100 UNIT/ML INJECTION    Inject as per sliding scale: if 0-69 =0 units; 70+ = 7 units, subcutaneously before meals related to DM.   INSULIN  GLARGINE (LANTUS) 100 UNIT/ML INJECTION    Inject 6 Units into the skin every morning.   LIDOCAINE-PRILOCAINE (EMLA) CREAM    Apply 1 application topically Every Tuesday,Thursday,and Saturday with dialysis.    MULTIPLE VITAMINS-MINERALS (DECUBI-VITE) CAPS    Take 2 capsules by mouth daily.   NUTRITIONAL SUPPLEMENTS (NUTRITIONAL SUPPLEMENT PO)    Take by mouth. Prostat 60 cc TID Mech Soft texture   OXYCODONE-ACETAMINOPHEN (PERCOCET/ROXICET) 5-325 MG TABLET    Take 1 tablet by mouth every 8 (eight) hours as needed for severe pain.   PANTOPRAZOLE (PROTONIX) 40 MG TABLET    Take 1 tablet (40 mg total) by mouth daily.   PRAVASTATIN (PRAVACHOL) 20 MG TABLET    Take 20 mg by mouth every evening.    PROMETHAZINE (PHENERGAN) 25 MG TABLET    Take 25 mg by mouth every 6 (six) hours as needed for nausea or vomiting.   SENNA (SENOKOT) 8.6 MG TABS TABLET    Take 1 tablet (8.6 mg total) by mouth daily as needed for mild constipation or moderate constipation.   SEVELAMER CARBONATE (RENVELA) 800 MG TABLET    Take 1 tablet (800 mg total) by mouth 3 (three) times daily with meals.  Modified Medications   No medications on file  Discontinued Medications   No medications on file     SIGNIFICANT DIAGNOSTIC EXAMS  03-22-14: 2-d echo: - Left ventricle: The cavity size was normal. Wall thickness was increased in a pattern of mild LVH. Systolic function was moderately to severely reduced. The estimated ejection fraction was in the range of 30% to 35%. Severe hypokinesis of the inferior myocardium. - Mitral valve: There was mild regurgitation. - Left atrium: The atrium was mildly dilated. - Pulmonary arteries: PA peak pressure: 31 mm Hg (S). - Pericardium, extracardiac: A trivial pericardial effusion was identified. There was a left pleural effusion.  05-04-14: chest x-ray: mild chf with bilateral air space disease and small bilateral pleural effusion   05-09-14: chest x-ray: 1. Stable mild cardiomegaly. 2. Decreased  pleural effusions and improved aeration.  05-09-14: left foot x-ray: Acute osteomyelitis involving in the residual 2nd metatarsal, 3rd metatarsal head, and 3rd proximal phalanx.Associated pathologic fracture  of the 3rd proximal phalanx.  03-08-15: barium swallow: 1. Narrow but patent gastroesophageal junction. Contrast does flows into the stomach but a large portion of the contrast remains within the patulous esophagus. Recommend caution in advancing diet. 2. No esophageal leak  03-09-15: kub: Normal small bowel gas pattern. Residual contrast material throughout the colon. No colonic distension. Degenerative changes lumbar spine  03-09-15: chest x-ray: Enlarged cardiac silhouette. Small bilateral pleural effusions. Airspace opacity in the right cardiophrenic angle, which likely represents focal airspace consolidation.      LABS REVIEWED:   11-24-14: hgb a1c 7.0   03-06-15: wbc 5.2; hgb 11.2; hct 35.7; mcv 85.0; plt 190; glucose 107; bun 37; creat 3.31; k+ 4.6; na++139 hgb a1c 7.5  03-09-15: urine culture: yeast; blood culture: no growth 03-11-15: stool for c-diff: neg 03-12-15: wbc 5.3; hgb 9.8; hct 31.1; mcv 85.1; plt 144; glucose 144; bun 28; creat 4.17; k+ 4.5; na++130; phos 3.7;  albumin 2.0  03-18-15: glucose 66; bun 22; creat 3.19; k+ 4.5; na++137; alk phos 140; albumin 2.6; ca++ 7.7; chol 130; ldl 60; trig 90; hdl 52  04-29-25: influenza swab: neg  05-25-15: pre-albumin 24  06-12-15: hgb a1c 7.4  07-10-15: wbc 4.5; hgb 11.2; hct 37.1; mcv 89.4; plt 125; glucose 118; bun 18; creat 2.97; k+ 3.7; na++ 135; liver normal albumin 2.9; chol 186; ldl 90; trig 101; hdl 76    Review of Systems  Constitutional: Negative for malaise/fatigue.  Respiratory: negative for cough . Negative for shortness of breath.    Cardiovascular: Negative for chest pain, palpitations and leg swelling.  Gastrointestinal: has upset stomach is mild pain present.  Musculoskeletal: Negative for myalgias, back pain and joint  pain.  Skin: Negative.   Neurological: Negative for dizziness.  Psychiatric/Behavioral: The patient is not nervous/anxious.      Physical Exam Constitutional: No distress.  Frail   Eyes: Conjunctivae are normal.  Neck: Neck supple. No JVD present. No thyromegaly present.  Cardiovascular: Normal rate, regular rhythm and intact distal pulses.   Respiratory: Effort normal and breath sounds normal. No respiratory distress. She has no wheezes.  GI: Soft. Bowel sounds are normal. She exhibits no distension. There is no tenderness.    Musculoskeletal: She exhibits no edema.  Able to move all extremities  Left bka Has foley    Lymphadenopathy:    She has no cervical adenopathy.  Neurological: She is alert.  Skin: Skin is warm and dry. She is not diaphoretic.  Has stage IV sacral wound followed by wound doctor  Psychiatric: She has a normal mood and affect.  Has left upper extremity A/V fistula +thrill + bruit       ASSESSMENT/ PLAN:  1. ESRD: followed by nephrology; is on hemodialysis three days per week. Is on 1200 cc fluid restriction; will continue renvela 800 mg three times daily will monitor  2. Diabetes; hgb a1c is 7.4 will continue her lantus PM units in the AM; will continue novolog 7 units with meals . Will monitor  3. achalasia of esophagus status post Heller/Toupet on 03-06-15: will continue protonix 40 mg daily; will monitor  4. Dyslipidemia: will continue pravachol 20 mg daily  ldl is 90  5. Anemia of chronic disease: hgb is 11.2; will monitor   6. CAD: has 3 vessel disease is status post cath on 03-24-14: no complaint of chest pain present; will continue asa 81 mg daily   7. Chronic systolic heart failure: is presently stable her EF is 30%-35%; without symptoms  of fluid overload present.   8. PAD: will continue asa 81 mg daily is status post left bka  9. Protein calorie malnutrition: will continue supplements per facility protocol her current weight is 84  pounds;  her albumin is 2.9  10. Allergic rhinitis: will continue  zyrtec to 10 mg nightly as needed   11. Urine retention: has long term foley   12. 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      Ok Edwards NP [facility]  Contact [phone] Monday through Friday 8am- 5pm  After hours call [phone]

## 2015-08-31 ENCOUNTER — Non-Acute Institutional Stay (SKILLED_NURSING_FACILITY): Payer: Medicare Other | Admitting: Adult Health

## 2015-08-31 ENCOUNTER — Encounter: Payer: Self-pay | Admitting: Adult Health

## 2015-08-31 DIAGNOSIS — L89154 Pressure ulcer of sacral region, stage 4: Secondary | ICD-10-CM

## 2015-08-31 DIAGNOSIS — L98429 Non-pressure chronic ulcer of back with unspecified severity: Secondary | ICD-10-CM

## 2015-08-31 NOTE — Progress Notes (Signed)
Patient ID: Elizabeth [name], female   DOB: 1945/09/16, 70 y.o.   MRN: [id number]   Location:   [place] Room Number: 103-A Place of Service:  SNF (31)   CODE STATUS: Full Code  No Known Allergies  Chief Complaint  Patient presents with  . Acute Visit    Wound    HPI:  I have been asked to see her chronic sacral wound. She is being followed by the wound doctor at this facility. There are no signs of infection present. She does not have any pain present. She does spend a great deal of time out of bed in wheelchair with a cushion present.    Past Medical History  Diagnosis Date  . Coronary artery disease, non-occlusive January 2016    Moderate p&mLAD & Ostial OM; Myoview Nuclear Stress Test 03/21/2014: EF 35%. Mild hypokinesis of the inferior wall with fixed inferior defect suggesting scar. (no culprit lesion on CATH)  . 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 Critical lower limb ischemia     Status post left BKA following left popliteal and peroneal artery endarterectomy with patch angioplasty.  . Diabetic neuropathy ([place])   . Hyperlipidemia with target LDL less than 70   . History of CVA (cerebrovascular accident) 08/19/2013    "minor one"  . ESRD (end stage renal disease) ([place])     [address], [place], [place] (06/12/2014)  . Urinary retention     DX NEUROGENIC BLADDER--HAS INDWELLING FOLEY CATHETER  . Foley catheter in place     for urinary retention  . Anemia   . Hematemesis April 2016    Hospitalized  . Arthritis of knee, right     "right knee" (06/12/2014)  . Gout     "was in my LLE"  . Diabetes mellitus, insulin dependent (IDDM), uncontrolled ([place])     [place]; has h/o Hypo & Hyperglycemic episodes, Type II  . Foot infection   . Loss of consciousness 08/19/2013  . UTI  (urinary tract infection) 10/17/2013  . Acute hyperkalemia 12/24/2013    Past Surgical History  Procedure Laterality Date  . Insertion of suprapubic catheter N/A 05/16/2012    Procedure: INSERTION OF SUPRAPUBIC CATHETER;  Surgeon: Alexis Frock, MD;  Location: [facility];  Service: Urology;  Laterality: N/A;  . Bascilic vein transposition Left 10/30/2012    Procedure: LEFT 1ST STAGE BASCILIC VEIN TRANSPOSITION;  Surgeon: Angelia Mould, MD;  Location: [place];  Service: Vascular;  Laterality: Left;  . Nm myocar perf wall motion  04/19/2012/ 02/2014    a) low risk study-EF 44%,mild inferolateral hypkinesis; b) EF 35%. Mild hypokinesis of the inferior wall with fixed inferior defect suggesting scar.  . Bascilic vein transposition Left 02/05/2013    Procedure: BASCILIC VEIN TRANSPOSITION 2ND STAGE;  Surgeon: Angelia Mould, MD;  Location: [place];  Service: Vascular;  Laterality: Left;  . Radiology with anesthesia N/A 10/25/2013    Procedure: RADIOLOGY WITH ANESTHESIA;  Surgeon: Medication Radiologist, MD;  Location: [place];  Service: Radiology;  Laterality: N/A;  . Fistulogram Left 04/29/2013    Procedure: FISTULOGRAM;  Surgeon: Angelia Mould, MD;  Location: [facility] CATH LAB;  Service: Cardiovascular;  Laterality: Left;  . Angioplasty Left 04/29/2013    Procedure: ANGIOPLASTY;  Surgeon: Angelia Mould, MD;  Location: [facility] CATH LAB;  Service:  Cardiovascular;  Laterality: Left;  . Abdominal aortagram N/A 03/19/2014    Procedure: ABDOMINAL Maxcine Ham;  Surgeon: Elam Dutch, MD;  Location: [facility] CATH LAB;  Service: Cardiovascular;  Laterality: N/A;  . Left and right heart catheterization with coronary angiogram N/A 03/24/2014    Procedure: LEFT AND RIGHT HEART CATHETERIZATION WITH CORONARY ANGIOGRAM;  Surgeon: Leonie Man, MD;  Location: [facility] CATH LAB;  Service: Cardiovascular; Moderate disease in p-mLAD & Ostial OM1; normal right heart pressures. Wedge pressure 29 mmHg. --No lesion to explain  inferior defect on Myoview and echocardiogram.   . Endarterectomy popliteal Left 03/26/2014    Procedure: Left popliteal and peroneal artery endarterectomy with vein patch angioplasty;  Surgeon: Elam Dutch, MD;  Location: [place];  Service: Vascular;  Laterality: Left;  . Amputation Left 03/26/2014    Procedure: AMPUTATION SECOND LEFT TOE;  Surgeon: Elam Dutch, MD;  Location: [place];  Service: Vascular;  Laterality: Left;  . Av fistula placement Left 04/28/2014    Procedure: CREATION OF LEFT BRACHIOCEPHALIC  ARTERIOVENOUS (AV) FISTULA CREATION;  Surgeon: Conrad Blissfield, MD;  Location: [place];  Service: Vascular;  Laterality: Left;  . Amputation Left 05/10/2014    Procedure: AMPUTATION BELOW KNEE;  Surgeon: Rosetta Posner, MD;  Location: [facility] OR;  Service: Vascular;  Laterality: Left;  . Esophagogastroduodenoscopy N/A 06/13/2014    Procedure: ESOPHAGOGASTRODUODENOSCOPY (EGD);  Surgeon: Carol Ada, MD;  Location: [facility] ENDOSCOPY;  Service: Endoscopy;  Laterality: N/A;  . Transthoracic echocardiogram  03/22/2014    EF 30-35%, mild LVH. Severe inferior hypokinesis. Mild MR.  Marland Kitchen Below knee leg amputation Left   . Fistulogram Left 10/08/2014    Procedure: LEFT ARM FISTULOGRAM;  Surgeon: Conrad [place], MD;  Location: [place];  Service: Vascular;  Laterality: Left;  . Venoplasty Left 10/08/2014    Procedure: LEFT CEPHALIC VEIN VENOPLASTY;  Surgeon: Conrad , MD;  Location: [place];  Service: Vascular;  Laterality: Left;  . Incision and drainage of wound N/A 10/29/2014    Procedure: IRRIGATION AND DEBRIDEMENT OF SACRAL WOUND, PLACEMENT OF A-CELL;  Surgeon: Theodoro Kos, DO;  Location: [place];  Service: Plastics;  Laterality: N/A;  . Application of a-cell of extremity N/A 10/29/2014    Procedure: PLACEMENT OF APPLICATION OF A-CELL;  Surgeon: Theodoro Kos, DO;  Location: [place];  Service: Plastics;  Laterality: N/A;  . Esophageal manometry N/A 11/24/2014    Procedure: ESOPHAGEAL MANOMETRY (EM);  Surgeon: Carol Ada,  MD;  Location: [facility];  Service: Endoscopy;  Laterality: N/A;  . Upper gi endoscopy  03/06/2015    Procedure: UPPER GI ENDOSCOPY;  Surgeon: Michael Boston, MD;  Location: [facility];  Service: General;;    Social History   Social History  . Marital Status: Single    Spouse Name: N/A  . Number of Children: N/A  . Years of Education: N/A   Occupational History  . Not on file.   Social History Main Topics  . Smoking status: Never Smoker   . Smokeless tobacco: Never Used  . Alcohol Use: No     Comment: "stopped drinking in 2013 drank a couple beers/day, 2 days/wk"  . Drug Use: No  . Sexual Activity: No   Other Topics Concern  . Not on file   Social History Narrative   Granddaughter is contact      Resident of [facility]- SNF    Family History  Problem Relation Age of Onset  . Hyperlipidemia Mother   . Hypertension  Mother   . Hodgkin's lymphoma Father   . Heart disease Sister       VITAL SIGNS BP 107/63 mmHg  Pulse 79  Temp(Src) 98.2 F (36.8 C) (Oral)  Resp 18  Ht _0  (1.6 m)  Wt 83 lb 6 oz (37.819 kg)  BMI 14.77 kg/m2  SpO2 97%  Patient's Medications  New Prescriptions   No medications on file  Previous Medications   ASCORBIC ACID (VITAMIN C) 500 MG TABLET    Take 500 mg by mouth 2 (two) times daily.   ASPIRIN EC 81 MG TABLET    Take 81 mg by mouth daily.   CETIRIZINE (ZYRTEC) 10 MG TABLET    Take 10 mg by mouth daily as needed for allergies.    FLUTICASONE (FLONASE) 50 MCG/ACT NASAL SPRAY    Place 1 spray into both nostrils 2 (two) times daily.   INSULIN ASPART (NOVOLOG) 100 UNIT/ML INJECTION    Inject as per sliding scale: if 0-69 =0 units; 70+ = 7 units, subcutaneously before meals related to DM.   INSULIN GLARGINE (LANTUS) 100 UNIT/ML INJECTION    Inject 6 Units into the skin every morning.   LIDOCAINE-PRILOCAINE (EMLA) CREAM    Apply 1 application topically Every Tuesday,Thursday,and Saturday with dialysis.    MULTIPLE VITAMINS-MINERALS  (DECUBI-VITE) CAPS    Take 2 capsules by mouth daily.   NUTRITIONAL SUPPLEMENTS (NUTRITIONAL SUPPLEMENT PO)    Take by mouth. Prostat 60 cc TID Mech Soft texture   OXYCODONE-ACETAMINOPHEN (PERCOCET/ROXICET) 5-325 MG TABLET    Take 1 tablet by mouth every 8 (eight) hours as needed for severe pain.   PANTOPRAZOLE (PROTONIX) 40 MG TABLET    Take 1 tablet (40 mg total) by mouth daily.   PRAVASTATIN (PRAVACHOL) 20 MG TABLET    Take 20 mg by mouth every evening.    PROMETHAZINE (PHENERGAN) 25 MG TABLET    Take 25 mg by mouth every 6 (six) hours as needed for nausea or vomiting.   SENNA (SENOKOT) 8.6 MG TABS TABLET    Take 1 tablet (8.6 mg total) by mouth daily as needed for mild constipation or moderate constipation.   SEVELAMER CARBONATE (RENVELA) 800 MG TABLET    Take 1 tablet (800 mg total) by mouth 3 (three) times daily with meals.  Modified Medications   No medications on file  Discontinued Medications   No medications on file     SIGNIFICANT DIAGNOSTIC EXAMS  03-22-14: 2-d echo: - Left ventricle: The cavity size was normal. Wall thickness was increased in a pattern of mild LVH. Systolic function was moderately to severely reduced. The estimated ejection fraction was in the range of 30% to 35%. Severe hypokinesis of the inferior myocardium. - Mitral valve: There was mild regurgitation. - Left atrium: The atrium was mildly dilated. - Pulmonary arteries: PA peak pressure: 31 mm Hg (S). - Pericardium, extracardiac: A trivial pericardial effusion was identified. There was a left pleural effusion.  05-04-14: chest x-ray: mild chf with bilateral air space disease and small bilateral pleural effusion   05-09-14: chest x-ray: 1. Stable mild cardiomegaly. 2. Decreased pleural effusions and improved aeration.  05-09-14: left foot x-ray: Acute osteomyelitis involving in the residual 2nd metatarsal, 3rd metatarsal head, and 3rd proximal phalanx.Associated pathologic fracture of the 3rd proximal  phalanx.  03-08-15: barium swallow: 1. Narrow but patent gastroesophageal junction. Contrast does flows into the stomach but a large portion of the contrast remains within the patulous esophagus. Recommend caution in advancing diet. 2.  No esophageal leak  03-09-15: kub: Normal small bowel gas pattern. Residual contrast material throughout the colon. No colonic distension. Degenerative changes lumbar spine  03-09-15: chest x-ray: Enlarged cardiac silhouette. Small bilateral pleural effusions. Airspace opacity in the right cardiophrenic angle, which likely represents focal airspace consolidation.      LABS REVIEWED:   11-24-14: hgb a1c 7.0   03-06-15: wbc 5.2; hgb 11.2; hct 35.7; mcv 85.0; plt 190; glucose 107; bun 37; creat 3.31; k+ 4.6; na++139 hgb a1c 7.5  03-09-15: urine culture: yeast; blood culture: no growth 03-11-15: stool for c-diff: neg 03-12-15: wbc 5.3; hgb 9.8; hct 31.1; mcv 85.1; plt 144; glucose 144; bun 28; creat 4.17; k+ 4.5; na++130; phos 3.7;  albumin 2.0  03-18-15: glucose 66; bun 22; creat 3.19; k+ 4.5; na++137; alk phos 140; albumin 2.6; ca++ 7.7; chol 130; ldl 60; trig 90; hdl 52  04-29-25: influenza swab: neg  05-25-15: pre-albumin 24  06-12-15: hgb a1c 7.4  07-10-15: wbc 4.5; hgb 11.2; hct 37.1; mcv 89.4; plt 125; glucose 118; bun 18; creat 2.97; k+ 3.7; na++ 135; liver normal albumin 2.9; chol 186; ldl 90; trig 101; hdl 76    Review of Systems  Constitutional: Negative for malaise/fatigue.  Respiratory: negative for cough . Negative for shortness of breath.    Cardiovascular: Negative for chest pain, palpitations and leg swelling.  Gastrointestinal: has upset stomach is mild pain present.  Musculoskeletal: Negative for myalgias, back pain and joint pain.  Skin: Negative.   Neurological: Negative for dizziness.  Psychiatric/Behavioral: The patient is not nervous/anxious.      Physical Exam Constitutional: No distress.  Frail   Eyes: Conjunctivae are normal.  Neck:  Neck supple. No JVD present. No thyromegaly present.  Cardiovascular: Normal rate, regular rhythm and intact distal pulses.   Respiratory: Effort normal and breath sounds normal. No respiratory distress. She has no wheezes.  GI: Soft. Bowel sounds are normal. She exhibits no distension. There is no tenderness.    Musculoskeletal: She exhibits no edema.  Able to move all extremities  Left bka Has foley    Lymphadenopathy:    She has no cervical adenopathy.  Neurological: She is alert.  Skin: Skin is warm and dry. She is not diaphoretic.  Has stage IV sacral wound 0.8 x 0.3 x 0.05 cm no signs of infection present; treated with dakins three times daily  Psychiatric: She has a normal mood and affect.  Has left upper extremity A/V fistula +thrill + bruit       ASSESSMENT/ PLAN:  1. 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 Will continue her current treatment and will continue to monitor         Ok Edwards NP [facility]  Contact [phone] Monday through Friday 8am- 5pm  After hours call [phone]

## 2015-09-07 ENCOUNTER — Encounter: Payer: Self-pay | Admitting: Adult Health

## 2015-09-07 ENCOUNTER — Non-Acute Institutional Stay (SKILLED_NURSING_FACILITY): Payer: Medicare Other | Admitting: Adult Health

## 2015-09-07 DIAGNOSIS — I5032 Chronic diastolic (congestive) heart failure: Secondary | ICD-10-CM | POA: Diagnosis not present

## 2015-09-07 DIAGNOSIS — N186 End stage renal disease: Secondary | ICD-10-CM

## 2015-09-07 DIAGNOSIS — K220 Achalasia of cardia: Secondary | ICD-10-CM | POA: Diagnosis not present

## 2015-09-07 DIAGNOSIS — Z992 Dependence on renal dialysis: Secondary | ICD-10-CM | POA: Diagnosis not present

## 2015-09-07 DIAGNOSIS — D638 Anemia in other chronic diseases classified elsewhere: Secondary | ICD-10-CM | POA: Diagnosis not present

## 2015-09-07 DIAGNOSIS — E1169 Type 2 diabetes mellitus with other specified complication: Secondary | ICD-10-CM

## 2015-09-07 DIAGNOSIS — I2510 Atherosclerotic heart disease of native coronary artery without angina pectoris: Secondary | ICD-10-CM | POA: Diagnosis not present

## 2015-09-07 DIAGNOSIS — L98429 Non-pressure chronic ulcer of back with unspecified severity: Secondary | ICD-10-CM

## 2015-09-07 DIAGNOSIS — E785 Hyperlipidemia, unspecified: Secondary | ICD-10-CM | POA: Diagnosis not present

## 2015-09-07 DIAGNOSIS — L89154 Pressure ulcer of sacral region, stage 4: Secondary | ICD-10-CM | POA: Diagnosis not present

## 2015-09-07 DIAGNOSIS — E1122 Type 2 diabetes mellitus with diabetic chronic kidney disease: Secondary | ICD-10-CM | POA: Diagnosis not present

## 2015-09-07 NOTE — Progress Notes (Signed)
Patient ID: Elizabeth [name], female   DOB: Dec 09, 1945, 70 y.o.   MRN: [id number]   Location:    [place] Room Number: 103-A Place of Service:  SNF (31)   CODE STATUS: Full Code  No Known Allergies  Chief Complaint  Patient presents with  . Medical Management of Chronic Issues    Follow up    HPI:  She is a long term resident of this facility being seen for the management of her chronic illnesses. Overall her status is without significant change. She continues with hemodialysis three days per week. She tell me that she is feeling good. There are no nursing concerns at this time.   Past Medical History  Diagnosis Date  . Coronary artery disease, non-occlusive January 2016    Moderate p&mLAD & Ostial OM; Myoview Nuclear Stress Test 03/21/2014: EF 35%. Mild hypokinesis of the inferior wall with fixed inferior defect suggesting scar. (no culprit lesion on CATH)  . 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 Critical lower limb ischemia     Status post left BKA following left popliteal and peroneal artery endarterectomy with patch angioplasty.  . Diabetic neuropathy ([place])   . Hyperlipidemia with target LDL less than 70   . History of CVA (cerebrovascular accident) 08/19/2013    "minor one"  . ESRD (end stage renal disease) ([place])     [address], [place], [place] (06/12/2014)  . Urinary retention     DX NEUROGENIC BLADDER--HAS INDWELLING FOLEY CATHETER  . Foley catheter in place     for urinary retention  . Anemia   . Hematemesis April 2016    Hospitalized  . Arthritis of knee, right     "right knee" (06/12/2014)  . Gout     "was in my LLE"  . Diabetes mellitus, insulin dependent (IDDM), uncontrolled ([place])     Brittle; has h/o Hypo & Hyperglycemic episodes, Type II  . Foot infection   .  Loss of consciousness 08/19/2013  . UTI (urinary tract infection) 10/17/2013  . Acute hyperkalemia 12/24/2013    Past Surgical History  Procedure Laterality Date  . Insertion of suprapubic catheter N/A 05/16/2012    Procedure: INSERTION OF SUPRAPUBIC CATHETER;  Surgeon: Alexis Frock, MD;  Location: [facility];  Service: Urology;  Laterality: N/A;  . Bascilic vein transposition Left 10/30/2012    Procedure: LEFT 1ST STAGE BASCILIC VEIN TRANSPOSITION;  Surgeon: Angelia Mould, MD;  Location: [place];  Service: Vascular;  Laterality: Left;  . Nm myocar perf wall motion  04/19/2012/ 02/2014    a) low risk study-EF 44%,mild inferolateral hypkinesis; b) EF 35%. Mild hypokinesis of the inferior wall with fixed inferior defect suggesting scar.  . Bascilic vein transposition Left 02/05/2013    Procedure: BASCILIC VEIN TRANSPOSITION 2ND STAGE;  Surgeon: Angelia Mould, MD;  Location: [place];  Service: Vascular;  Laterality: Left;  . Radiology with anesthesia N/A 10/25/2013    Procedure: RADIOLOGY WITH ANESTHESIA;  Surgeon: Medication Radiologist, MD;  Location: [place];  Service: Radiology;  Laterality: N/A;  . Fistulogram Left 04/29/2013    Procedure: FISTULOGRAM;  Surgeon: Angelia Mould, MD;  Location: [facility] CATH LAB;  Service: Cardiovascular;  Laterality: Left;  . Angioplasty Left 04/29/2013    Procedure: ANGIOPLASTY;  Surgeon: Angelia Mould, MD;  Location: [facility] CATH LAB;  Service: Cardiovascular;  Laterality: Left;  . Abdominal aortagram N/A 03/19/2014    Procedure: ABDOMINAL Maxcine Ham;  Surgeon: Elam Dutch, MD;  Location: [facility] CATH LAB;  Service: Cardiovascular;  Laterality: N/A;  . Left and right heart catheterization with coronary angiogram N/A 03/24/2014    Procedure: LEFT AND RIGHT HEART CATHETERIZATION WITH CORONARY ANGIOGRAM;  Surgeon: Leonie Man, MD;  Location: [facility] CATH LAB;  Service: Cardiovascular; Moderate disease in p-mLAD & Ostial OM1; normal right heart pressures. Wedge  pressure 29 mmHg. --No lesion to explain inferior defect on Myoview and echocardiogram.   . Endarterectomy popliteal Left 03/26/2014    Procedure: Left popliteal and peroneal artery endarterectomy with vein patch angioplasty;  Surgeon: Elam Dutch, MD;  Location: [place];  Service: Vascular;  Laterality: Left;  . Amputation Left 03/26/2014    Procedure: AMPUTATION SECOND LEFT TOE;  Surgeon: Elam Dutch, MD;  Location: [place];  Service: Vascular;  Laterality: Left;  . Av fistula placement Left 04/28/2014    Procedure: CREATION OF LEFT BRACHIOCEPHALIC  ARTERIOVENOUS (AV) FISTULA CREATION;  Surgeon: Conrad Cankton, MD;  Location: [place];  Service: Vascular;  Laterality: Left;  . Amputation Left 05/10/2014    Procedure: AMPUTATION BELOW KNEE;  Surgeon: Rosetta Posner, MD;  Location: [facility] OR;  Service: Vascular;  Laterality: Left;  . Esophagogastroduodenoscopy N/A 06/13/2014    Procedure: ESOPHAGOGASTRODUODENOSCOPY (EGD);  Surgeon: Carol Ada, MD;  Location: [facility] ENDOSCOPY;  Service: Endoscopy;  Laterality: N/A;  . Transthoracic echocardiogram  03/22/2014    EF 30-35%, mild LVH. Severe inferior hypokinesis. Mild MR.  Marland Kitchen Below knee leg amputation Left   . Fistulogram Left 10/08/2014    Procedure: LEFT ARM FISTULOGRAM;  Surgeon: Conrad Winona, MD;  Location: [place];  Service: Vascular;  Laterality: Left;  . Venoplasty Left 10/08/2014    Procedure: LEFT CEPHALIC VEIN VENOPLASTY;  Surgeon: Conrad [place] Harbor, MD;  Location: [place];  Service: Vascular;  Laterality: Left;  . Incision and drainage of wound N/A 10/29/2014    Procedure: IRRIGATION AND DEBRIDEMENT OF SACRAL WOUND, PLACEMENT OF A-CELL;  Surgeon: Theodoro Kos, DO;  Location: [place];  Service: Plastics;  Laterality: N/A;  . Application of a-cell of extremity N/A 10/29/2014    Procedure: PLACEMENT OF APPLICATION OF A-CELL;  Surgeon: Theodoro Kos, DO;  Location: [place];  Service: Plastics;  Laterality: N/A;  . Esophageal manometry N/A 11/24/2014    Procedure: ESOPHAGEAL  MANOMETRY (EM);  Surgeon: Carol Ada, MD;  [facility];  Service: Endoscopy;  Laterality: N/A;  . Upper gi endoscopy  03/06/2015    Procedure: UPPER GI ENDOSCOPY;  Surgeon: Michael Boston, MD;  Location: [facility];  Service: General;;    Social History   Social History  . Marital Status: Single    Spouse Name: N/A  . Number of Children: N/A  . Years of Education: N/A   Occupational History  . Not on file.   Social History Main Topics  . Smoking status: Never Smoker   . Smokeless tobacco: Never Used  . Alcohol Use: No     Comment: "stopped drinking in 2013 drank a couple beers/day, 2 days/wk"  . Drug Use: No  . Sexual Activity: No   Other Topics Concern  . Not on file   Social History Narrative   Granddaughter is contact      Resident of [facility]- SNF    Family History  Problem Relation Age of Onset  . Hyperlipidemia Mother   .  Hypertension Mother   . Hodgkin's lymphoma Father   . Heart disease Sister       VITAL SIGNS BP 107/63 mmHg  Pulse 79  Temp(Src) 98.2 F (36.8 C) (Oral)  Resp 18  Ht 5' 3"  (1.6 m)  Wt 83 lb 6 oz (37.819 kg)  BMI 14.77 kg/m2  SpO2 97%  Patient's Medications  New Prescriptions   No medications on file  Previous Medications   ASCORBIC ACID (VITAMIN C) 500 MG TABLET    Take 500 mg by mouth 2 (two) times daily.   ASPIRIN EC 81 MG TABLET    Take 81 mg by mouth daily.   CETIRIZINE (ZYRTEC) 10 MG TABLET    Take 10 mg by mouth daily as needed for allergies.    FLUTICASONE (FLONASE) 50 MCG/ACT NASAL SPRAY    Place 1 spray into both nostrils 2 (two) times daily.   INSULIN ASPART (NOVOLOG) 100 UNIT/ML INJECTION    Inject as per sliding scale: if 0-69 =0 units; 70+ = 7 units, subcutaneously before meals related to DM.   INSULIN GLARGINE (LANTUS) 100 UNIT/ML INJECTION    Inject 6 Units into the skin every morning.   LIDOCAINE-PRILOCAINE (EMLA) CREAM    Apply 1 application topically Every Tuesday,Thursday,and Saturday with dialysis.     MULTIPLE VITAMINS-MINERALS (DECUBI-VITE) CAPS    Take 2 capsules by mouth daily.   NUTRITIONAL SUPPLEMENTS (NUTRITIONAL SUPPLEMENT PO)    Take by mouth. Prostat 60 cc TID Mech Soft texture   OXYCODONE-ACETAMINOPHEN (PERCOCET/ROXICET) 5-325 MG TABLET    Take 1 tablet by mouth every 8 (eight) hours as needed for severe pain.   PANTOPRAZOLE (PROTONIX) 40 MG TABLET    Take 1 tablet (40 mg total) by mouth daily.   PRAVASTATIN (PRAVACHOL) 20 MG TABLET    Take 20 mg by mouth every evening.    PROMETHAZINE (PHENERGAN) 25 MG TABLET    Take 25 mg by mouth every 6 (six) hours as needed for nausea or vomiting.   SENNA (SENOKOT) 8.6 MG TABS TABLET    Take 1 tablet (8.6 mg total) by mouth daily as needed for mild constipation or moderate constipation.   SEVELAMER CARBONATE (RENVELA) 800 MG TABLET    Take 1 tablet (800 mg total) by mouth 3 (three) times daily with meals.  Modified Medications   No medications on file  Discontinued Medications   No medications on file     SIGNIFICANT DIAGNOSTIC EXAMS  03-22-14: 2-d echo: - Left ventricle: The cavity size was normal. Wall thickness was increased in a pattern of mild LVH. Systolic function was moderately to severely reduced. The estimated ejection fraction was in the range of 30% to 35%. Severe hypokinesis of the inferior myocardium. - Mitral valve: There was mild regurgitation. - Left atrium: The atrium was mildly dilated. - Pulmonary arteries: PA peak pressure: 31 mm Hg (S). - Pericardium, extracardiac: A trivial pericardial effusion was identified. There was a left pleural effusion.  05-04-14: chest x-ray: mild chf with bilateral air space disease and small bilateral pleural effusion   05-09-14: chest x-ray: 1. Stable mild cardiomegaly. 2. Decreased pleural effusions and improved aeration.  05-09-14: left foot x-ray: Acute osteomyelitis involving in the residual 2nd metatarsal, 3rd metatarsal head, and 3rd proximal phalanx.Associated pathologic fracture of  the 3rd proximal phalanx.  03-08-15: barium swallow: 1. Narrow but patent gastroesophageal junction. Contrast does flows into the stomach but a large portion of the contrast remains within the patulous esophagus. Recommend caution in advancing diet.  2. No esophageal leak  03-09-15: kub: Normal small bowel gas pattern. Residual contrast material throughout the colon. No colonic distension. Degenerative changes lumbar spine  03-09-15: chest x-ray: Enlarged cardiac silhouette. Small bilateral pleural effusions. Airspace opacity in the right cardiophrenic angle, which likely represents focal airspace consolidation.      LABS REVIEWED:   11-24-14: hgb a1c 7.0   03-06-15: wbc 5.2; hgb 11.2; hct 35.7; mcv 85.0; plt 190; glucose 107; bun 37; creat 3.31; k+ 4.6; na++139 hgb a1c 7.5  03-09-15: urine culture: yeast; blood culture: no growth 03-11-15: stool for c-diff: neg 03-12-15: wbc 5.3; hgb 9.8; hct 31.1; mcv 85.1; plt 144; glucose 144; bun 28; creat 4.17; k+ 4.5; na++130; phos 3.7;  albumin 2.0  03-18-15: glucose 66; bun 22; creat 3.19; k+ 4.5; na++137; alk phos 140; albumin 2.6; ca++ 7.7; chol 130; ldl 60; trig 90; hdl 52  04-29-25: influenza swab: neg  05-25-15: pre-albumin 24  06-12-15: hgb a1c 7.4  07-10-15: wbc 4.5; hgb 11.2; hct 37.1; mcv 89.4; plt 125; glucose 118; bun 18; creat 2.97; k+ 3.7; na++ 135; liver normal albumin 2.9; chol 186; ldl 90; trig 101; hdl 76    Review of Systems  Constitutional: Negative for malaise/fatigue.  Respiratory: negative for cough . Negative for shortness of breath.    Cardiovascular: Negative for chest pain, palpitations and leg swelling.  Gastrointestinal: no abdominal pain no constipation  Musculoskeletal: Negative for myalgias, back pain and joint pain.  Skin: Negative.   Neurological: Negative for dizziness.  Psychiatric/Behavioral: The patient is not nervous/anxious.      Physical Exam Constitutional: No distress.  Frail   Eyes: Conjunctivae are  normal.  Neck: Neck supple. No JVD present. No thyromegaly present.  Cardiovascular: Normal rate, regular rhythm and intact distal pulses.   Respiratory: Effort normal and breath sounds normal. No respiratory distress. She has no wheezes.  GI: Soft. Bowel sounds are normal. She exhibits no distension. There is no tenderness.    Musculoskeletal: She exhibits no edema.  Able to move all extremities  Left bka Has foley    Lymphadenopathy:    She has no cervical adenopathy.  Neurological: She is alert.  Skin: Skin is warm and dry. She is not diaphoretic.  Has stage IV sacral wound 0.9 x 0.5 x 0.3 cm no signs of infection present; treated with dakins three times daily  Psychiatric: She has a normal mood and affect.  Has left upper extremity A/V fistula +thrill + bruit       ASSESSMENT/ PLAN:   1. ESRD: followed by nephrology; is on hemodialysis three days per week. Is on 1200 cc fluid restriction; will continue renvela 800 mg three times daily will monitor  2. Diabetes; hgb a1c is 7.4 will continue her lantus 7 units in the AM; will continue novolog 7 units with meals . Will monitor  3. achalasia of esophagus status post Heller/Toupet on 03-06-15: will continue protonix 40 mg daily; will monitor  4. Dyslipidemia: will continue pravachol 20 mg daily  ldl is 90  5. Anemia of chronic disease: hgb is 11.2; will monitor   6. CAD: has 3 vessel disease is status post cath on 03-24-14: no complaint of chest pain present; will continue asa 81 mg daily   7. Chronic systolic heart failure: is presently stable her EF is 30%-35%; without symptoms of fluid overload present.   8. PAD: will continue asa 81 mg daily is status post left bka  9. Protein calorie malnutrition: will continue  supplements per facility protocol her current weight is 84  pounds; her albumin is 2.9  10. Allergic rhinitis: will continue  zyrtec to 10 mg nightly as needed   11. Urine retention: has long term foley   12. 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      Ok Edwards NP [facility]  Contact [phone] Monday through Friday 8am- 5pm  After hours call [phone]

## 2015-10-08 ENCOUNTER — Non-Acute Institutional Stay (SKILLED_NURSING_FACILITY): Payer: Medicare Other | Admitting: Adult Health

## 2015-10-08 DIAGNOSIS — E785 Hyperlipidemia, unspecified: Secondary | ICD-10-CM

## 2015-10-08 DIAGNOSIS — L89154 Pressure ulcer of sacral region, stage 4: Secondary | ICD-10-CM | POA: Diagnosis not present

## 2015-10-08 DIAGNOSIS — D638 Anemia in other chronic diseases classified elsewhere: Secondary | ICD-10-CM | POA: Diagnosis not present

## 2015-10-08 DIAGNOSIS — N186 End stage renal disease: Secondary | ICD-10-CM | POA: Diagnosis not present

## 2015-10-08 DIAGNOSIS — I5032 Chronic diastolic (congestive) heart failure: Secondary | ICD-10-CM

## 2015-10-08 DIAGNOSIS — K220 Achalasia of cardia: Secondary | ICD-10-CM | POA: Diagnosis not present

## 2015-10-08 DIAGNOSIS — E1122 Type 2 diabetes mellitus with diabetic chronic kidney disease: Secondary | ICD-10-CM | POA: Diagnosis not present

## 2015-10-08 DIAGNOSIS — Z992 Dependence on renal dialysis: Secondary | ICD-10-CM

## 2015-10-08 DIAGNOSIS — I2510 Atherosclerotic heart disease of native coronary artery without angina pectoris: Secondary | ICD-10-CM | POA: Diagnosis not present

## 2015-10-08 DIAGNOSIS — E1169 Type 2 diabetes mellitus with other specified complication: Secondary | ICD-10-CM

## 2015-10-08 DIAGNOSIS — I739 Peripheral vascular disease, unspecified: Secondary | ICD-10-CM

## 2015-10-08 DIAGNOSIS — L98429 Non-pressure chronic ulcer of back with unspecified severity: Secondary | ICD-10-CM

## 2015-10-21 ENCOUNTER — Encounter: Payer: Self-pay | Admitting: Internal Medicine

## 2015-10-21 LAB — HM DIABETES EYE EXAM

## 2015-11-07 ENCOUNTER — Encounter: Payer: Self-pay | Admitting: Adult Health

## 2015-11-07 NOTE — Progress Notes (Signed)
Location:   Psychiatrist of Service:  SNF (31)   CODE STATUS: full code   No Known Allergies  Chief Complaint  Patient presents with  . Medical Management of Chronic Issues    HPI:  She is a long term resident of this facility being seen for the management of her chronic illnesses. Overall her status is stable. She does continue dialysis three days per week. She is not voicing any concerns or complaints today. There are no nursing concerns at this time.    Past Medical History:  Diagnosis Date  . Acute hyperkalemia 12/24/2013  . Anemia   . Arthritis of knee, right    "right knee" (06/12/2014)  . Coronary artery disease, non-occlusive January 2016   Moderate p&mLAD & Ostial OM; Myoview Nuclear Stress Test 03/21/2014: EF 35%. Mild hypokinesis of the inferior wall with fixed inferior defect suggesting scar. (no culprit lesion on CATH)  . Critical lower limb ischemia    Status post left BKA following left popliteal and peroneal artery endarterectomy with patch angioplasty.  . Diabetes mellitus, insulin dependent (IDDM), uncontrolled ([place])    Brittle; has h/o Hypo & Hyperglycemic episodes, Type II  . Diabetic neuropathy ([place])   . ESRD (end stage renal disease) ([place])    [address], [place], [place] (06/12/2014)  . Foley catheter in place    for urinary retention  . Foot infection   . Gout    "was in my LLE"  . Hematemesis April 2016   Hospitalized  . History of CVA (cerebrovascular accident) 08/19/2013   "minor one"  . Hyperlipidemia with target LDL less than 70   . Loss of consciousness 08/19/2013  . 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 Urinary retention    DX NEUROGENIC BLADDER--HAS INDWELLING FOLEY CATHETER  . UTI (urinary tract infection) 10/17/2013    Past Surgical History:    Procedure Laterality Date  . ABDOMINAL AORTAGRAM N/A 03/19/2014   Procedure: ABDOMINAL Maxcine Ham;  Surgeon: Elam Dutch, MD;  Location: [facility] CATH LAB;  Service: Cardiovascular;  Laterality: N/A;  . AMPUTATION Left 03/26/2014   Procedure: AMPUTATION SECOND LEFT TOE;  Surgeon: Elam Dutch, MD;  Location: [place];  Service: Vascular;  Laterality: Left;  . AMPUTATION Left 05/10/2014   Procedure: AMPUTATION BELOW KNEE;  Surgeon: Rosetta Posner, MD;  Location: [place];  Service: Vascular;  Laterality: Left;  . ANGIOPLASTY Left 04/29/2013   Procedure: ANGIOPLASTY;  Surgeon: Angelia Mould, MD;  Location: [facility] CATH LAB;  Service: Cardiovascular;  Laterality: Left;  . APPLICATION OF A-CELL OF EXTREMITY N/A 10/29/2014   Procedure: PLACEMENT OF APPLICATION OF A-CELL;  Surgeon: Theodoro Kos, DO;  Location: [place];  Service: Plastics;  Laterality: N/A;  . AV FISTULA PLACEMENT Left 04/28/2014   Procedure: CREATION OF LEFT BRACHIOCEPHALIC  ARTERIOVENOUS (AV) FISTULA CREATION;  Surgeon: Conrad [place], MD;  Location: [place];  Service: Vascular;  Laterality: Left;  . BASCILIC VEIN TRANSPOSITION Left 10/30/2012   Procedure: LEFT 1ST STAGE BASCILIC VEIN TRANSPOSITION;  Surgeon: Angelia Mould, MD;  Location: [place];  Service: Vascular;  Laterality: Left;  . BASCILIC VEIN TRANSPOSITION Left 02/05/2013   Procedure: BASCILIC VEIN TRANSPOSITION 2ND STAGE;  Surgeon: Angelia Mould, MD;  Location: [place];  Service: Vascular;  Laterality: Left;  . BELOW  KNEE LEG AMPUTATION Left   . ENDARTERECTOMY POPLITEAL Left 03/26/2014   Procedure: Left popliteal and peroneal artery endarterectomy with vein patch angioplasty;  Surgeon: Elam Dutch, MD;  Location: [place];  Service: Vascular;  Laterality: Left;  . ESOPHAGEAL MANOMETRY N/A 11/24/2014   Procedure: ESOPHAGEAL MANOMETRY (EM);  Surgeon: Carol Ada, MD;  Location: [facility];  Service: Endoscopy;  Laterality: N/A;  . ESOPHAGOGASTRODUODENOSCOPY N/A 06/13/2014    Procedure: ESOPHAGOGASTRODUODENOSCOPY (EGD);  Surgeon: Carol Ada, MD;  Location: [facility] ENDOSCOPY;  Service: Endoscopy;  Laterality: N/A;  . FISTULOGRAM Left 04/29/2013   Procedure: FISTULOGRAM;  Surgeon: Angelia Mould, MD;  Location: [facility] CATH LAB;  Service: Cardiovascular;  Laterality: Left;  . FISTULOGRAM Left 10/08/2014   Procedure: LEFT ARM FISTULOGRAM;  Surgeon: Conrad [place], MD;  Location: [place];  Service: Vascular;  Laterality: Left;  . INCISION AND DRAINAGE OF WOUND N/A 10/29/2014   Procedure: IRRIGATION AND DEBRIDEMENT OF SACRAL WOUND, PLACEMENT OF A-CELL;  Surgeon: Theodoro Kos, DO;  Location: [place];  Service: Plastics;  Laterality: N/A;  . INSERTION OF SUPRAPUBIC CATHETER N/A 05/16/2012   Procedure: INSERTION OF SUPRAPUBIC CATHETER;  Surgeon: Alexis Frock, MD;  Location: [facility];  Service: Urology;  Laterality: N/A;  . LEFT AND RIGHT HEART CATHETERIZATION WITH CORONARY ANGIOGRAM N/A 03/24/2014   Procedure: LEFT AND RIGHT HEART CATHETERIZATION WITH CORONARY ANGIOGRAM;  Surgeon: Leonie Man, MD;  Location: [facility] CATH LAB;  Service: Cardiovascular; Moderate disease in p-mLAD & Ostial OM1; normal right heart pressures. Wedge pressure 29 mmHg. --No lesion to explain inferior defect on Myoview and echocardiogram.   . NM MYOCAR PERF WALL MOTION  04/19/2012/ 02/2014   a) low risk study-EF 44%,mild inferolateral hypkinesis; b) EF 35%. Mild hypokinesis of the inferior wall with fixed inferior defect suggesting scar.  Marland Kitchen RADIOLOGY WITH ANESTHESIA N/A 10/25/2013   Procedure: RADIOLOGY WITH ANESTHESIA;  Surgeon: Medication Radiologist, MD;  Location: [place];  Service: Radiology;  Laterality: N/A;  . TRANSTHORACIC ECHOCARDIOGRAM  03/22/2014   EF 30-35%, mild LVH. Severe inferior hypokinesis. Mild MR.  Marland Kitchen UPPER GI ENDOSCOPY  03/06/2015   Procedure: UPPER GI ENDOSCOPY;  Surgeon: Michael Boston, MD;  Location: [facility];  Service: General;;  . VENOPLASTY Left 10/08/2014   Procedure: LEFT CEPHALIC VEIN VENOPLASTY;   Surgeon: Conrad , MD;  Location: [place];  Service: Vascular;  Laterality: Left;    Social History   Social History  . Marital status: Single    Spouse name: N/A  . Number of children: N/A  . Years of education: N/A   Occupational History  . Not on file.   Social History Main Topics  . Smoking status: Never Smoker  . Smokeless tobacco: Never Used  . Alcohol use No     Comment: "stopped drinking in 2013 drank a couple beers/day, 2 days/wk"  . Drug use: No  . Sexual activity: No   Other Topics Concern  . Not on file   Social History Narrative   Granddaughter is contact      Resident of [facility]- SNF    Family History  Problem Relation Age of Onset  . Hyperlipidemia Mother   . Hypertension Mother   . Hodgkin's lymphoma Father   . Heart disease Sister       VITAL SIGNS BP 109/65   Pulse (!) 106   Ht 5' 3" (1.6 m)   Wt 78 lb 9.6 oz (35.7 kg)   SpO2 97%   BMI 13.92 kg/m  Patient's Medications  New Prescriptions   No medications on file  Previous Medications   ASCORBIC ACID (VITAMIN C) 500 MG TABLET    Take 500 mg by mouth 2 (two) times daily.   ASPIRIN EC 81 MG TABLET    Take 81 mg by mouth daily.   CETIRIZINE (ZYRTEC) 10 MG TABLET    Take 10 mg by mouth daily as needed for allergies.    FLUTICASONE (FLONASE) 50 MCG/ACT NASAL SPRAY    Place 1 spray into both nostrils 2 (two) times daily.   INSULIN ASPART (NOVOLOG) 100 UNIT/ML INJECTION    Inject as per sliding scale: if 0-69 =0 units; 70+ = 7 units, subcutaneously before meals related to DM.   INSULIN GLARGINE (LANTUS) 100 UNIT/ML INJECTION    Inject 6 Units into the skin every morning.   LIDOCAINE-PRILOCAINE (EMLA) CREAM    Apply 1 application topically Every Tuesday,Thursday,and Saturday with dialysis.    MULTIPLE VITAMINS-MINERALS (DECUBI-VITE) CAPS    Take 2 capsules by mouth daily.   NUTRITIONAL SUPPLEMENTS (NUTRITIONAL SUPPLEMENT PO)    Take by mouth. Prostat 60 cc TID Mech Soft texture    OXYCODONE-ACETAMINOPHEN (PERCOCET/ROXICET) 5-325 MG TABLET    Take 1 tablet by mouth every 8 (eight) hours as needed for severe pain.   PANTOPRAZOLE (PROTONIX) 40 MG TABLET    Take 1 tablet (40 mg total) by mouth daily.   PRAVASTATIN (PRAVACHOL) 20 MG TABLET    Take 20 mg by mouth every evening.    PROMETHAZINE (PHENERGAN) 25 MG TABLET    Take 25 mg by mouth every 6 (six) hours as needed for nausea or vomiting.   SENNA (SENOKOT) 8.6 MG TABS TABLET    Take 1 tablet (8.6 mg total) by mouth daily as needed for mild constipation or moderate constipation.   SEVELAMER CARBONATE (RENVELA) 800 MG TABLET    Take 1 tablet (800 mg total) by mouth 3 (three) times daily with meals.  Modified Medications   No medications on file  Discontinued Medications   No medications on file     SIGNIFICANT DIAGNOSTIC EXAMS    03-22-14: 2-d echo: - Left ventricle: The cavity size was normal. Wall thickness was increased in a pattern of mild LVH. Systolic function was moderately to severely reduced. The estimated ejection fraction was in the range of 30% to 35%. Severe hypokinesis of the inferior myocardium. - Mitral valve: There was mild regurgitation. - Left atrium: The atrium was mildly dilated. - Pulmonary arteries: PA peak pressure: 31 mm Hg (S). - Pericardium, extracardiac: A trivial pericardial effusion was identified. There was a left pleural effusion.  05-04-14: chest x-ray: mild chf with bilateral air space disease and small bilateral pleural effusion   05-09-14: chest x-ray: 1. Stable mild cardiomegaly. 2. Decreased pleural effusions and improved aeration.  05-09-14: left foot x-ray: Acute osteomyelitis involving in the residual 2nd metatarsal, 3rd metatarsal head, and 3rd proximal phalanx.Associated pathologic fracture of the 3rd proximal phalanx.  03-08-15: barium swallow: 1. Narrow but patent gastroesophageal junction. Contrast does flows into the stomach but a large portion of the contrast remains  within the patulous esophagus. Recommend caution in advancing diet. 2. No esophageal leak  03-09-15: kub: Normal small bowel gas pattern. Residual contrast material throughout the colon. No colonic distension. Degenerative changes lumbar spine  03-09-15: chest x-ray: Enlarged cardiac silhouette. Small bilateral pleural effusions. Airspace opacity in the right cardiophrenic angle, which likely represents focal airspace consolidation.      LABS REVIEWED:  11-24-14: hgb a1c 7.0   03-06-15: wbc 5.2; hgb 11.2; hct 35.7; mcv 85.0; plt 190; glucose 107; bun 37; creat 3.31; k+ 4.6; na++139 hgb a1c 7.5  03-09-15: urine culture: yeast; blood culture: no growth 03-11-15: stool for c-diff: neg 03-12-15: wbc 5.3; hgb 9.8; hct 31.1; mcv 85.1; plt 144; glucose 144; bun 28; creat 4.17; k+ 4.5; na++130; phos 3.7;  albumin 2.0  03-18-15: glucose 66; bun 22; creat 3.19; k+ 4.5; na++137; alk phos 140; albumin 2.6; ca++ 7.7; chol 130; ldl 60; trig 90; hdl 52  04-29-25: influenza swab: neg  05-25-15: pre-albumin 24  06-12-15: hgb a1c 7.4  07-10-15: wbc 4.5; hgb 11.2; hct 37.1; mcv 89.4; plt 125; glucose 118; bun 18; creat 2.97; k+ 3.7; na++ 135; liver normal albumin 2.9; chol 186; ldl 90; trig 101; hdl 76    Review of Systems  Constitutional: Negative for malaise/fatigue.  Respiratory: negative for cough . Negative for shortness of breath.    Cardiovascular: Negative for chest pain, palpitations and leg swelling.  Gastrointestinal: no abdominal pain no constipation  Musculoskeletal: Negative for myalgias, back pain and joint pain.  Skin: Negative.   Neurological: Negative for dizziness.  Psychiatric/Behavioral: The patient is not nervous/anxious.      Physical Exam Constitutional: No distress.  Frail   Eyes: Conjunctivae are normal.  Neck: Neck supple. No JVD present. No thyromegaly present.  Cardiovascular: Normal rate, regular rhythm and intact distal pulses.   Respiratory: Effort normal and breath sounds  normal. No respiratory distress. She has no wheezes.  GI: Soft. Bowel sounds are normal. She exhibits no distension. There is no tenderness.    Musculoskeletal: She exhibits no edema.  Able to move all extremities  Left bka Has foley    Lymphadenopathy:    She has no cervical adenopathy.  Neurological: She is alert.  Skin: Skin is warm and dry. She is not diaphoretic.  Has stage IV sacral wound 0.3 x 0.9 x 0.4 cm no signs of infection present; treated with dakins three times daily  Psychiatric: She has a normal mood and affect.  Has left upper extremity A/V fistula +thrill + bruit       ASSESSMENT/ PLAN:   1. ESRD: followed by nephrology; is on hemodialysis three days per week. Is on 1200 cc fluid restriction; will continue renvela 800 mg three times daily will monitor  2. Diabetes; hgb a1c is 7.4 will continue her lantus 7 units in the AM; will continue novolog 7 units with meals . Will monitor  3. achalasia of esophagus status post Heller/Toupet on 03-06-15: will continue protonix 40 mg daily; will monitor  4. Dyslipidemia: will continue pravachol 20 mg daily  ldl is 90  5. Anemia of chronic disease: hgb is 11.2; will monitor   6. CAD: has 3 vessel disease is status post cath on 03-24-14: no complaint of chest pain present; will continue asa 81 mg daily   7. Chronic systolic heart failure: is presently stable her EF is 30%-35%; without symptoms of fluid overload present.   8. PAD: will continue asa 81 mg daily is status post left bka  9. Protein calorie malnutrition: will continue supplements per facility protocol her current weight is 78.6  pounds; her albumin is 2.9  10. Allergic rhinitis: will continue  zyrtec to 10 mg nightly as needed   11. Urine retention: has long term foley   12. 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        Deborah Green NP [facility]  Contact [phone] Monday through Friday 8am- 5pm  After hours  call [phone]   

## 2015-11-09 ENCOUNTER — Encounter: Payer: Self-pay | Admitting: Internal Medicine

## 2015-11-09 ENCOUNTER — Non-Acute Institutional Stay (SKILLED_NURSING_FACILITY): Payer: Medicare Other | Admitting: Internal Medicine

## 2015-11-09 DIAGNOSIS — R633 Feeding difficulties: Secondary | ICD-10-CM | POA: Diagnosis not present

## 2015-11-09 DIAGNOSIS — E43 Unspecified severe protein-calorie malnutrition: Secondary | ICD-10-CM | POA: Diagnosis not present

## 2015-11-09 DIAGNOSIS — K220 Achalasia of cardia: Secondary | ICD-10-CM

## 2015-11-09 DIAGNOSIS — N186 End stage renal disease: Secondary | ICD-10-CM | POA: Diagnosis not present

## 2015-11-09 DIAGNOSIS — L89154 Pressure ulcer of sacral region, stage 4: Secondary | ICD-10-CM | POA: Diagnosis not present

## 2015-11-09 DIAGNOSIS — Z89512 Acquired absence of left leg below knee: Secondary | ICD-10-CM

## 2015-11-09 DIAGNOSIS — I5032 Chronic diastolic (congestive) heart failure: Secondary | ICD-10-CM | POA: Diagnosis not present

## 2015-11-09 DIAGNOSIS — L98429 Non-pressure chronic ulcer of back with unspecified severity: Secondary | ICD-10-CM

## 2015-11-09 DIAGNOSIS — E1122 Type 2 diabetes mellitus with diabetic chronic kidney disease: Secondary | ICD-10-CM | POA: Diagnosis not present

## 2015-11-09 DIAGNOSIS — Z992 Dependence on renal dialysis: Secondary | ICD-10-CM | POA: Diagnosis not present

## 2015-11-09 DIAGNOSIS — K0889 Other specified disorders of teeth and supporting structures: Secondary | ICD-10-CM

## 2015-11-09 NOTE — Progress Notes (Signed)
Patient ID: Elizabeth [name], female   DOB: 10/01/1945, 70 y.o.   MRN: [id number]    DATE:  11/09/2015  Location:    [facility] Room Number: 103 A Place of Service: SNF (31)   Extended Emergency Contact Information Primary Emergency Contact: Olegario ShearerMiller,Jasmyne          [place] Mobile Phone: [phone] Relation: Grandaughter Secondary Emergency Contact: Jean Rosenthalarter,Lynn  [place] Mobile Phone: [phone] Relation: Niece  Advanced Directive information Does patient have an advance directive?: No, Would patient like information on creating an advanced directive?: No - patient declined information  Chief Complaint  Patient presents with  . Medical Management of Chronic Issues    Routine Visit    HPI:  70 yo female long term resident seen today for f/u. She c/o poor dentition and would like to see dentist. She also requests more "whole meat". No other concerns. No nursing issues. No falls. She occasionally refuses HD.  ESRD on HD TThsa via left arm AVG. She is on a 1200 cc fluid restriction. Takes  Renvela. Followed by nephrology  DM - A1c 7.4%. Takes lantus 7 units in the AM;  novolog 7 units with meals . 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 LDL 90  Anemia of chronic disease- stable. Hgb 11.2  CAD - she has 3 vessel disease. Last cardiac cath on 03-24-14. No  complaint of chest pain present; takes ASA 81 mg daily   Chronic systolic heart failure - stable. 2D echo revealed EF is 30%-35%; without symptoms of fluid overload present. mx with HD   PAD - s/p left BKA. Takes ASA 81 mg daily  Protein calorie malnutrition - stable on supplements per facility protocol. 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     Past Medical History:  Diagnosis Date  . Acute hyperkalemia 12/24/2013  . Anemia   . Arthritis of knee, right    "right knee" (06/12/2014)  . Coronary artery disease, non-occlusive January 2016   Moderate p&mLAD & Ostial OM; Myoview Nuclear Stress Test 03/21/2014: EF 35%. Mild hypokinesis of the inferior wall with fixed inferior defect suggesting scar. (no culprit lesion on CATH)  . Critical lower limb ischemia    Status post left BKA following left popliteal and peroneal artery endarterectomy with patch angioplasty.  . Diabetes mellitus, insulin dependent (IDDM), uncontrolled (HCC)    Brittle; has h/o Hypo & Hyperglycemic episodes, Type II  . Diabetic neuropathy (HCC)   . ESRD (end stage renal disease) (HCC)    [address] (06/12/2014)  . Foley catheter in place    for urinary retention  . Foot infection   . Gout    "was in my LLE"  . Hematemesis April 2016   Hospitalized  . History of CVA (cerebrovascular accident) 08/19/2013   "minor one"  . Hyperlipidemia with target LDL less than 70   . Loss of consciousness 08/19/2013  . Peripheral vascular disease of lower extremity with ulceration (HCC) January-March 2016   PV Angiogram 03/19/2014: Bilateral AP and PT artery occlusions, 90% Left peroneal artery with single-vessel Right  peroneal and flow  . Urinary retention    DX NEUROGENIC BLADDER--HAS INDWELLING FOLEY CATHETER  . UTI (urinary tract infection) 10/17/2013    Past Surgical History:  Procedure Laterality Date  . ABDOMINAL AORTAGRAM N/A 03/19/2014   Procedure: ABDOMINAL Ronny Flurry;  Surgeon: Sherren Kerns, MD;  Location: [facility] CATH LAB;  Service: Cardiovascular;  Laterality: N/A;  . AMPUTATION Left 03/26/2014   Procedure: AMPUTATION SECOND LEFT TOE;  Surgeon: Sherren Kerns, MD;  Location: [facility] OR;  Service: Vascular;  Laterality: Left;  . AMPUTATION Left 05/10/2014    Procedure: AMPUTATION BELOW KNEE;  Surgeon: Larina Earthly, MD;  Location: [facility] OR;  Service: Vascular;  Laterality: Left;  . ANGIOPLASTY Left 04/29/2013   Procedure: ANGIOPLASTY;  Surgeon: Chuck Hint, MD;  Location: [facility] CATH LAB;  Service: Cardiovascular;  Laterality: Left;  . APPLICATION OF A-CELL OF EXTREMITY N/A 10/29/2014   Procedure: PLACEMENT OF APPLICATION OF A-CELL;  Surgeon: Wayland Denis, DO;  Location: [facility] OR;  Service: Plastics;  Laterality: N/A;  . AV FISTULA PLACEMENT Left 04/28/2014   Procedure: CREATION OF LEFT BRACHIOCEPHALIC  ARTERIOVENOUS (AV) FISTULA CREATION;  Surgeon: Fransisco Hertz, MD;  Location: [facility] OR;  Service: Vascular;  Laterality: Left;  . BASCILIC VEIN TRANSPOSITION Left 10/30/2012   Procedure: LEFT 1ST STAGE BASCILIC VEIN TRANSPOSITION;  Surgeon: Chuck Hint, MD;  Location: [facility] OR;  Service: Vascular;  Laterality: Left;  . BASCILIC VEIN TRANSPOSITION Left 02/05/2013   Procedure: BASCILIC VEIN TRANSPOSITION 2ND STAGE;  Surgeon: Chuck Hint, MD;  Location: [facility] OR;  Service: Vascular;  Laterality: Left;  . BELOW KNEE LEG AMPUTATION Left   . ENDARTERECTOMY POPLITEAL Left 03/26/2014   Procedure: Left popliteal and peroneal artery endarterectomy with vein patch angioplasty;  Surgeon: Sherren Kerns, MD;  Location: [facility] OR;  Service: Vascular;  Laterality: Left;  . ESOPHAGEAL MANOMETRY N/A 11/24/2014   Procedure: ESOPHAGEAL MANOMETRY (EM);  Surgeon: Jeani Hawking, MD;  Location: [facility] ENDOSCOPY;  Service: Endoscopy;  Laterality: N/A;  . ESOPHAGOGASTRODUODENOSCOPY N/A 06/13/2014   Procedure: ESOPHAGOGASTRODUODENOSCOPY (EGD);  Surgeon: Jeani Hawking, MD;  Location: [facility] ENDOSCOPY;  Service: Endoscopy;  Laterality: N/A;  . FISTULOGRAM Left 04/29/2013   Procedure: FISTULOGRAM;  Surgeon: Chuck Hint, MD;  Location: [facility] CATH LAB;  Service: Cardiovascular;  Laterality: Left;  . FISTULOGRAM Left 10/08/2014   Procedure: LEFT ARM FISTULOGRAM;  Surgeon: Fransisco Hertz, MD;   Location: [facility] OR;  Service: Vascular;  Laterality: Left;  . INCISION AND DRAINAGE OF WOUND N/A 10/29/2014   Procedure: IRRIGATION AND DEBRIDEMENT OF SACRAL WOUND, PLACEMENT OF A-CELL;  Surgeon: Wayland Denis, DO;  Location: [facility] OR;  Service: Plastics;  Laterality: N/A;  . INSERTION OF SUPRAPUBIC CATHETER N/A 05/16/2012   Procedure: INSERTION OF SUPRAPUBIC CATHETER;  Surgeon: Sebastian Ache, MD;  Location: [facility] ORS;  Service: Urology;  Laterality: N/A;  . LEFT AND RIGHT HEART CATHETERIZATION WITH CORONARY ANGIOGRAM N/A 03/24/2014   Procedure: LEFT AND RIGHT HEART CATHETERIZATION WITH CORONARY ANGIOGRAM;  Surgeon: Marykay Lex, MD;  Location: [facility] CATH LAB;  Service: Cardiovascular; Moderate disease in p-mLAD & Ostial OM1; normal right heart pressures. Wedge pressure 29 mmHg. --No lesion to explain inferior defect on Myoview and echocardiogram.   . NM MYOCAR PERF WALL MOTION  04/19/2012/ 02/2014   a) low risk study-EF 44%,mild inferolateral hypkinesis; b) EF 35%. Mild hypokinesis of the inferior wall with fixed inferior defect suggesting scar.  Marland Kitchen RADIOLOGY WITH ANESTHESIA N/A 10/25/2013   Procedure: RADIOLOGY WITH ANESTHESIA;  Surgeon:  Medication Radiologist, MD;  Location: [facility] OR;  Service: Radiology;  Laterality: N/A;  . TRANSTHORACIC ECHOCARDIOGRAM  03/22/2014   EF 30-35%, mild LVH. Severe inferior hypokinesis. Mild MR.  Marland Kitchen UPPER GI ENDOSCOPY  03/06/2015   Procedure: UPPER GI ENDOSCOPY;  Surgeon: Karie Soda, MD;  Location: [facility] ORS;  Service: General;;  . VENOPLASTY Left 10/08/2014   Procedure: LEFT CEPHALIC VEIN VENOPLASTY;  Surgeon: Fransisco Hertz, MD;  Location: [facility] OR;  Service: Vascular;  Laterality: Left;    Patient Care Team: Kirt Boys, DO as PCP - General (Internal Medicine) Primitivo Gauze, MD as Consulting Physician (Nephrology) Chrystie Nose, MD as Consulting Physician (Cardiology) Jeani Hawking, MD as Consulting Physician (Gastroenterology) Karie Soda, MD as Consulting Physician (General  Surgery) Fransisco Hertz, MD as Consulting Physician (Vascular Surgery) Sharee Holster, NP as Nurse Practitioner (Geriatric Medicine) [facility]Skilled Nursing Facility)  Social History   Social History  . Marital status: Single    Spouse name: N/A  . Number of children: N/A  . Years of education: N/A   Occupational History  . Not on file.   Social History Main Topics  . Smoking status: Never Smoker  . Smokeless tobacco: Never Used  . Alcohol use No     Comment: "stopped drinking in 2013 drank a couple beers/day, 2 days/wk"  . Drug use: No  . Sexual activity: No   Other Topics Concern  . Not on file   Social History Narrative   Granddaughter is contact      Resident of [facility]      reports that she has never smoked. She has never used smokeless tobacco. She reports that she does not drink alcohol or use drugs.  Family History  Problem Relation Age of Onset  . Hyperlipidemia Mother   . Hypertension Mother   . Hodgkin's lymphoma Father   . Heart disease Sister    Family Status  Relation Status  . Mother Deceased at age 68  . Father Deceased at age 75  . Sister Deceased  . Child Alive  . Child Alive    Immunization History  Administered Date(s) Administered  . Influenza-Unspecified 11/21/2012, 12/22/2013, 11/22/2014  . Pneumococcal Polysaccharide-23 10/21/2013    No Known Allergies  Medications: Patient's Medications  New Prescriptions   No medications on file  Previous Medications   ASCORBIC ACID (VITAMIN C) 500 MG TABLET    Take 500 mg by mouth 2 (two) times daily.   ASPIRIN EC 81 MG TABLET    Take 81 mg by mouth daily.   CETIRIZINE (ZYRTEC) 10 MG TABLET    Take 10 mg by mouth daily as needed for allergies.    FLUTICASONE (FLONASE) 50 MCG/ACT NASAL SPRAY    Place 1 spray into both nostrils 2 (two) times daily.   INSULIN ASPART (NOVOLOG) 100 UNIT/ML INJECTION    Inject as per sliding scale: if 0-69 =0 units; 70+ = 7 units,  subcutaneously before meals related to DM.   INSULIN GLARGINE (LANTUS) 100 UNIT/ML INJECTION    Inject 6 Units into the skin every morning.   LIDOCAINE-PRILOCAINE (EMLA) CREAM    Apply 1 application topically Every Tuesday,Thursday,and Saturday with dialysis.    MULTIPLE VITAMINS-MINERALS (DECUBI-VITE) CAPS    Take 2 capsules by mouth daily.   NUTRITIONAL SUPPLEMENTS (NUTRITIONAL SUPPLEMENT PO)    Take by mouth. Prostat 60 cc TID Mech Soft texture   OXYCODONE-ACETAMINOPHEN (PERCOCET/ROXICET) 5-325 MG TABLET    Take 1 tablet  by mouth every 8 (eight) hours as needed for severe pain.   PANTOPRAZOLE (PROTONIX) 40 MG TABLET    Take 1 tablet (40 mg total) by mouth daily.   PRAVASTATIN (PRAVACHOL) 20 MG TABLET    Take 20 mg by mouth every evening.    PROMETHAZINE (PHENERGAN) 25 MG TABLET    Take 25 mg by mouth every 6 (six) hours as needed for nausea or vomiting.   SENNA (SENOKOT) 8.6 MG TABLET    Take 1 tablet by mouth 2 (two) times daily.   SEVELAMER CARBONATE (RENVELA) 800 MG TABLET    Take 1,600 mg by mouth 3 (three) times daily with meals.  Modified Medications   No medications on file  Discontinued Medications   SENNA (SENOKOT) 8.6 MG TABS TABLET    Take 1 tablet (8.6 mg total) by mouth daily as needed for mild constipation or moderate constipation.   SEVELAMER CARBONATE (RENVELA) 800 MG TABLET    Take 1 tablet (800 mg total) by mouth 3 (three) times daily with meals.    Review of Systems  HENT: Positive for dental problem.   Musculoskeletal: Positive for arthralgias.  Skin: Positive for wound.  All other systems reviewed and are negative.   Vitals:   11/09/15 1258  BP: (!) 126/58  Pulse: 94  Resp: 18  Temp: 98.2 F (36.8 C)  TempSrc: Oral  SpO2: 98%  Weight: 80 lb 3.2 oz (36.4 kg)  Height: 5\' 3"  (1.6 m)   Body mass index is 14.21 kg/m.  Physical Exam  Constitutional: She is oriented to person, place, and time. She appears well-developed.  Frail appearing sitting in w/c in  NAD  HENT:  Mouth/Throat: Oropharynx is clear and moist. No oropharyngeal exudate.  Poor dentition; MMM; no oral thrush  Eyes: Pupils are equal, round, and reactive to light. No scleral icterus.  Neck: Neck supple. Carotid bruit is present (b/l from chest). No tracheal deviation present. No thyromegaly present.  Cardiovascular: Normal rate, regular rhythm and intact distal pulses.   Occasional extrasystoles are present. Exam reveals no gallop and no friction rub.   Murmur (2/6 SEM) heard. No RLE edema, DP/PT pulse weakly palpable. No right calf TTP. Left BKA. Left arm AVF with palpable thrill and audible bruit  Pulmonary/Chest: Effort normal and breath sounds normal. No stridor. No respiratory distress. She has no wheezes. She has no rales.  Abdominal: Soft. Bowel sounds are normal. She exhibits no distension and no mass. There is no hepatomegaly. There is no tenderness. There is no rebound and no guarding.  Genitourinary:  Genitourinary Comments: Foley cath DTG clear urine  Musculoskeletal: She exhibits edema and deformity.  Left BKA  Lymphadenopathy:    She has no cervical adenopathy.  Neurological: She is alert and oriented to person, place, and time.  Skin: Skin is warm and dry. No rash noted.  Sacral wound per wound care  Psychiatric: She has a normal mood and affect. Her behavior is normal. Judgment and thought content normal.  Vitals reviewed.    Labs reviewed: No visits with results within 3 Month(s) from this visit.  Latest known visit with results is:  Nursing Home on 08/06/2015  Component Date Value Ref Range Status  . Glucose 07/10/2015 118  mg/dL Final  . BUN 40/98/1191 18  4 - 21 mg/dL Final  . Creatinine 47/82/9562 3.0* 0.5 - 1.1 mg/dL Final  . Potassium 13/09/6576 3.7  3.4 - 5.3 mmol/L Final  . Sodium 07/10/2015 135* 137 - 147 mmol/L  Final  . LDl/HDL Ratio 07/10/2015 2.4   Final  . Triglycerides 07/10/2015 101  40 - 160 mg/dL Final  . Cholesterol 57/84/6962 186  0  - 200 mg/dL Final  . HDL 95/28/4132 76* 35 - 70 mg/dL Final  . LDL Cholesterol 07/10/2015 90  mg/dL Final  . Alkaline Phosphatase 07/10/2015 97  25 - 125 U/L Final  . ALT 07/10/2015 32  7 - 35 U/L Final  . AST 07/10/2015 27  13 - 35 U/L Final  . Bilirubin, Total 07/10/2015 0.3  mg/dL Final    No results found.   Assessment/Plan   ICD-9-CM ICD-10-CM   1. Difficulty chewing 783.3 R63.3   2. Achalasia of esophagus s/p Heller/Toupet 03/06/2015 530.0 K22.0   3. ESRD on hemodialysis (HCC) 585.6 N18.6    V45.11 Z99.2   4. Controlled type 2 diabetes mellitus with chronic kidney disease on chronic dialysis, unspecified long term insulin use status (HCC) 250.40 E11.22    585.9 N18.6    V45.11    5. Protein-calorie malnutrition, severe (HCC) 262 E43   6. S/P BKA (below knee amputation) unilateral, left (HCC) V49.75 Z89.512   7. Chronic diastolic congestive heart failure (HCC) 428.32 I50.32    428.0    8. 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 [place], [place] [address] [phone] Cell (Monday-Friday 8 AM - 5 PM) [phone] After 5 PM and follow prompts

## 2015-12-07 ENCOUNTER — Encounter: Payer: Self-pay | Admitting: Adult Health

## 2015-12-07 ENCOUNTER — Non-Acute Institutional Stay (SKILLED_NURSING_FACILITY): Payer: Medicare Other | Admitting: Adult Health

## 2015-12-07 DIAGNOSIS — Z992 Dependence on renal dialysis: Secondary | ICD-10-CM | POA: Diagnosis not present

## 2015-12-07 DIAGNOSIS — E1122 Type 2 diabetes mellitus with diabetic chronic kidney disease: Secondary | ICD-10-CM

## 2015-12-07 DIAGNOSIS — K220 Achalasia of cardia: Secondary | ICD-10-CM

## 2015-12-07 DIAGNOSIS — L98429 Non-pressure chronic ulcer of back with unspecified severity: Secondary | ICD-10-CM

## 2015-12-07 DIAGNOSIS — E1169 Type 2 diabetes mellitus with other specified complication: Secondary | ICD-10-CM | POA: Diagnosis not present

## 2015-12-07 DIAGNOSIS — L89154 Pressure ulcer of sacral region, stage 4: Secondary | ICD-10-CM

## 2015-12-07 DIAGNOSIS — I5032 Chronic diastolic (congestive) heart failure: Secondary | ICD-10-CM | POA: Diagnosis not present

## 2015-12-07 DIAGNOSIS — I2510 Atherosclerotic heart disease of native coronary artery without angina pectoris: Secondary | ICD-10-CM | POA: Diagnosis not present

## 2015-12-07 DIAGNOSIS — E785 Hyperlipidemia, unspecified: Secondary | ICD-10-CM

## 2015-12-07 DIAGNOSIS — N186 End stage renal disease: Secondary | ICD-10-CM

## 2015-12-07 DIAGNOSIS — I739 Peripheral vascular disease, unspecified: Secondary | ICD-10-CM

## 2015-12-07 NOTE — Progress Notes (Signed)
Patient ID: Elizabeth [name], female   DOB: 1946/01/03, 70 y.o.   MRN: [id number]   Location:   [place] Room Number: 103-A Place of Service:  SNF (31)   CODE STATUS: Full Code  No Known Allergies  Chief Complaint  Patient presents with  . Medical Management of Chronic Issues    Follow up    HPI:  She is a long term resident of this facility being seen for the management of her chronic illnesses. Overall there is little change in her status. She will at times decline her dialysis. She is not voicing any complaints at this time. There are no nursing concerns.   Past Medical History:  Diagnosis Date  . Acute hyperkalemia 12/24/2013  . Anemia   . Arthritis of knee, right    "right knee" (06/12/2014)  . Coronary artery disease, non-occlusive January 2016   Moderate p&mLAD & Ostial OM; Myoview Nuclear Stress Test 03/21/2014: EF 35%. Mild hypokinesis of the inferior wall with fixed inferior defect suggesting scar. (no culprit lesion on CATH)  . Critical lower limb ischemia    Status post left BKA following left popliteal and peroneal artery endarterectomy with patch angioplasty.  . Diabetes mellitus, insulin dependent (IDDM), uncontrolled ([place])    Brittle; has h/o Hypo & Hyperglycemic episodes, Type II  . Diabetic neuropathy ([place])   . ESRD (end stage renal disease) ([place])    [address], [place], [place] (06/12/2014)  . Foley catheter in place    for urinary retention  . Foot infection   . Gout    "was in my LLE"  . Hematemesis April 2016   Hospitalized  . History of CVA (cerebrovascular accident) 08/19/2013   "minor one"  . Hyperlipidemia with target LDL less than 70   . Loss of consciousness ([place]) 08/19/2013  . 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 Urinary retention    DX NEUROGENIC  BLADDER--HAS INDWELLING FOLEY CATHETER  . UTI (urinary tract infection) 10/17/2013    Past Surgical History:  Procedure Laterality Date  . ABDOMINAL AORTAGRAM N/A 03/19/2014   Procedure: ABDOMINAL Maxcine Ham;  Surgeon: Elam Dutch, MD;  Location: [facility] CATH LAB;  Service: Cardiovascular;  Laterality: N/A;  . AMPUTATION Left 03/26/2014   Procedure: AMPUTATION SECOND LEFT TOE;  Surgeon: Elam Dutch, MD;  Location: [place];  Service: Vascular;  Laterality: Left;  . AMPUTATION Left 05/10/2014   Procedure: AMPUTATION BELOW KNEE;  Surgeon: Rosetta Posner, MD;  Location: [place];  Service: Vascular;  Laterality: Left;  . ANGIOPLASTY Left 04/29/2013   Procedure: ANGIOPLASTY;  Surgeon: Angelia Mould, MD;  Location: [facility] CATH LAB;  Service: Cardiovascular;  Laterality: Left;  . APPLICATION OF A-CELL OF EXTREMITY N/A 10/29/2014   Procedure: PLACEMENT OF APPLICATION OF A-CELL;  Surgeon: Theodoro Kos, DO;  Location: [place];  Service: Plastics;  Laterality: N/A;  . AV FISTULA PLACEMENT Left 04/28/2014   Procedure: CREATION OF LEFT BRACHIOCEPHALIC  ARTERIOVENOUS (AV) FISTULA CREATION;  Surgeon: Conrad West Perrine, MD;  Location: [place];  Service: Vascular;  Laterality: Left;  . BASCILIC VEIN TRANSPOSITION Left 10/30/2012   Procedure: LEFT 1ST STAGE BASCILIC VEIN TRANSPOSITION;  Surgeon: Angelia Mould, MD;  Location: [place];  Service: Vascular;  Laterality: Left;  . BASCILIC VEIN TRANSPOSITION Left 02/05/2013   Procedure: BASCILIC VEIN TRANSPOSITION 2ND  STAGE;  Surgeon: Angelia Mould, MD;  Location: [place];  Service: Vascular;  Laterality: Left;  . BELOW KNEE LEG AMPUTATION Left   . ENDARTERECTOMY POPLITEAL Left 03/26/2014   Procedure: Left popliteal and peroneal artery endarterectomy with vein patch angioplasty;  Surgeon: Elam Dutch, MD;  Location: [place];  Service: Vascular;  Laterality: Left;  . ESOPHAGEAL MANOMETRY N/A 11/24/2014   Procedure: ESOPHAGEAL MANOMETRY (EM);  Surgeon: Carol Ada, MD;   Location: [facility];  Service: Endoscopy;  Laterality: N/A;  . ESOPHAGOGASTRODUODENOSCOPY N/A 06/13/2014   Procedure: ESOPHAGOGASTRODUODENOSCOPY (EGD);  Surgeon: Carol Ada, MD;  Location: [facility] ENDOSCOPY;  Service: Endoscopy;  Laterality: N/A;  . FISTULOGRAM Left 04/29/2013   Procedure: FISTULOGRAM;  Surgeon: Angelia Mould, MD;  Location: [facility] CATH LAB;  Service: Cardiovascular;  Laterality: Left;  . FISTULOGRAM Left 10/08/2014   Procedure: LEFT ARM FISTULOGRAM;  Surgeon: Conrad , MD;  Location: [place];  Service: Vascular;  Laterality: Left;  . INCISION AND DRAINAGE OF WOUND N/A 10/29/2014   Procedure: IRRIGATION AND DEBRIDEMENT OF SACRAL WOUND, PLACEMENT OF A-CELL;  Surgeon: Theodoro Kos, DO;  Location: [place];  Service: Plastics;  Laterality: N/A;  . INSERTION OF SUPRAPUBIC CATHETER N/A 05/16/2012   Procedure: INSERTION OF SUPRAPUBIC CATHETER;  Surgeon: Alexis Frock, MD;  Location: [facility];  Service: Urology;  Laterality: N/A;  . LEFT AND RIGHT HEART CATHETERIZATION WITH CORONARY ANGIOGRAM N/A 03/24/2014   Procedure: LEFT AND RIGHT HEART CATHETERIZATION WITH CORONARY ANGIOGRAM;  Surgeon: Leonie Man, MD;  Location[facility] CATH LAB;  Service: Cardiovascular; Moderate disease in p-mLAD & Ostial OM1; normal right heart pressures. Wedge pressure 29 mmHg. --No lesion to explain inferior defect on Myoview and echocardiogram.   . NM MYOCAR PERF WALL MOTION  04/19/2012/ 02/2014   a) low risk study-EF 44%,mild inferolateral hypkinesis; b) EF 35%. Mild hypokinesis of the inferior wall with fixed inferior defect suggesting scar.  Marland Kitchen RADIOLOGY WITH ANESTHESIA N/A 10/25/2013   Procedure: RADIOLOGY WITH ANESTHESIA;  Surgeon: Medication Radiologist, MD;  Location: [place];  Service: Radiology;  Laterality: N/A;  . TRANSTHORACIC ECHOCARDIOGRAM  03/22/2014   EF 30-35%, mild LVH. Severe inferior hypokinesis. Mild MR.  Marland Kitchen UPPER GI ENDOSCOPY  03/06/2015   Procedure: UPPER GI ENDOSCOPY;  Surgeon: Michael Boston, MD;   Location: [facility];  Service: General;;  . VENOPLASTY Left 10/08/2014   Procedure: LEFT CEPHALIC VEIN VENOPLASTY;  Surgeon: Conrad , MD;  Location: [place];  Service: Vascular;  Laterality: Left;    Social History   Social History  . Marital status: Single    Spouse name: N/A  . Number of children: N/A  . Years of education: N/A   Occupational History  . Not on file.   Social History Main Topics  . Smoking status: Never Smoker  . Smokeless tobacco: Never Used  . Alcohol use No     Comment: "stopped drinking in 2013 drank a couple beers/day, 2 days/wk"  . Drug use: No  . Sexual activity: No   Other Topics Concern  . Not on file   Social History Narrative   Granddaughter is contact      Resident of [facility]- SNF    Family History  Problem Relation Age of Onset  . Hyperlipidemia Mother   . Hypertension Mother   . Hodgkin's lymphoma Father   . Heart disease Sister       VITAL SIGNS BP (!) 126/58   Pulse 94   Temp 98.2 F (36.8  C) (Oral)   Resp 18   Ht 5' 3"  (1.6 m)   Wt 77 lb 6 oz (35.1 kg)   SpO2 98%   BMI 13.71 kg/m   Patient's Medications  New Prescriptions   No medications on file  Previous Medications   ASCORBIC ACID (VITAMIN C) 500 MG TABLET    Take 500 mg by mouth 2 (two) times daily.   ASPIRIN EC 81 MG TABLET    Take 81 mg by mouth daily.   CETIRIZINE (ZYRTEC) 10 MG TABLET    Take 10 mg by mouth daily as needed for allergies.    FLUTICASONE (FLONASE) 50 MCG/ACT NASAL SPRAY    Place 1 spray into both nostrils 2 (two) times daily.   INSULIN ASPART (NOVOLOG) 100 UNIT/ML INJECTION    Inject as per sliding scale: if 0-69 =0 units; 70+ = 7 units, subcutaneously before meals related to DM.   INSULIN GLARGINE (LANTUS) 100 UNIT/ML INJECTION    Inject 6 Units into the skin every morning.   LIDOCAINE-PRILOCAINE (EMLA) CREAM    Apply 1 application topically Every Tuesday,Thursday,and Saturday with dialysis.    MULTIPLE VITAMINS-MINERALS (DECUBI-VITE) CAPS     Take 2 capsules by mouth daily.   NUTRITIONAL SUPPLEMENTS (NUTRITIONAL SUPPLEMENT PO)    Take by mouth. Prostat 120 cc TID Mech Soft texture   OXYCODONE-ACETAMINOPHEN (PERCOCET/ROXICET) 5-325 MG TABLET    Take 1 tablet by mouth every 8 (eight) hours as needed for severe pain.   PANTOPRAZOLE (PROTONIX) 40 MG TABLET    Take 1 tablet (40 mg total) by mouth daily.   PRAVASTATIN (PRAVACHOL) 20 MG TABLET    Take 20 mg by mouth every evening.    PROMETHAZINE (PHENERGAN) 25 MG TABLET    Take 25 mg by mouth every 6 (six) hours as needed for nausea or vomiting.   SENNA (SENOKOT) 8.6 MG TABLET    Take 1 tablet by mouth 2 (two) times daily.   SEVELAMER CARBONATE (RENVELA) 800 MG TABLET    Take 1,600 mg by mouth 3 (three) times daily with meals.  Modified Medications   No medications on file  Discontinued Medications   No medications on file     SIGNIFICANT DIAGNOSTIC EXAMS  03-22-14: 2-d echo: - Left ventricle: The cavity size was normal. Wall thickness was increased in a pattern of mild LVH. Systolic function was moderately to severely reduced. The estimated ejection fraction was in the range of 30% to 35%. Severe hypokinesis of the inferior myocardium. - Mitral valve: There was mild regurgitation. - Left atrium: The atrium was mildly dilated. - Pulmonary arteries: PA peak pressure: 31 mm Hg (S). - Pericardium, extracardiac: A trivial pericardial effusion was identified. There was a left pleural effusion.  05-04-14: chest x-ray: mild chf with bilateral air space disease and small bilateral pleural effusion   05-09-14: chest x-ray: 1. Stable mild cardiomegaly. 2. Decreased pleural effusions and improved aeration.  05-09-14: left foot x-ray: Acute osteomyelitis involving in the residual 2nd metatarsal, 3rd metatarsal head, and 3rd proximal phalanx.Associated pathologic fracture of the 3rd proximal phalanx.  03-08-15: barium swallow: 1. Narrow but patent gastroesophageal junction. Contrast does  flows into the stomach but a large portion of the contrast remains within the patulous esophagus. Recommend caution in advancing diet. 2. No esophageal leak  03-09-15: kub: Normal small bowel gas pattern. Residual contrast material throughout the colon. No colonic distension. Degenerative changes lumbar spine  03-09-15: chest x-ray: Enlarged cardiac silhouette. Small bilateral pleural effusions. Airspace opacity in  the right cardiophrenic angle, which likely represents focal airspace consolidation.      LABS REVIEWED:   11-24-14: hgb a1c 7.0   03-06-15: wbc 5.2; hgb 11.2; hct 35.7; mcv 85.0; plt 190; glucose 107; bun 37; creat 3.31; k+ 4.6; na++139 hgb a1c 7.5  03-09-15: urine culture: yeast; blood culture: no growth 03-11-15: stool for c-diff: neg 03-12-15: wbc 5.3; hgb 9.8; hct 31.1; mcv 85.1; plt 144; glucose 144; bun 28; creat 4.17; k+ 4.5; na++130; phos 3.7;  albumin 2.0  03-18-15: glucose 66; bun 22; creat 3.19; k+ 4.5; na++137; alk phos 140; albumin 2.6; ca++ 7.7; chol 130; ldl 60; trig 90; hdl 52  04-29-25: influenza swab: neg  05-25-15: pre-albumin 24  06-12-15: hgb a1c 7.4  07-10-15: wbc 4.5; hgb 11.2; hct 37.1; mcv 89.4; plt 125; glucose 118; bun 18; creat 2.97; k+ 3.7; na++ 135; liver normal albumin 2.9; chol 186; ldl 90; trig 101; hdl 76 11-30-15: hgb a1c 8.9; pre-albumin 19    Review of Systems  Constitutional: Negative for malaise/fatigue.  Respiratory: negative for cough . Negative for shortness of breath.    Cardiovascular: Negative for chest pain, palpitations and leg swelling.  Gastrointestinal: no abdominal pain no constipation  Musculoskeletal: Negative for myalgias, back pain and joint pain.  Skin: Negative.   Neurological: Negative for dizziness.  Psychiatric/Behavioral: The patient is not nervous/anxious.      Physical Exam Constitutional: No distress.  Frail   Eyes: Conjunctivae are normal.  Neck: Neck supple. No JVD present. No thyromegaly present.   Cardiovascular: Normal rate, regular rhythm and intact distal pulses.   Respiratory: Effort normal and breath sounds normal. No respiratory distress. She has no wheezes.  GI: Soft. Bowel sounds are normal. She exhibits no distension. There is no tenderness.    Musculoskeletal: She exhibits no edema.  Able to move all extremities  Left bka Has foley    Lymphadenopathy:    She has no cervical adenopathy.  Neurological: She is alert.  Skin: Skin is warm and dry. She is not diaphoretic.  Has stage IV sacral wound 0.3 x 0.9 x 0.4 cm no signs of infection present; treated with dakins three times daily  Psychiatric: She has a normal mood and affect.  Has left upper extremity A/V fistula +thrill + bruit       ASSESSMENT/ PLAN:   1. ESRD: followed by nephrology; is on hemodialysis three days per week. Is on 1200 cc fluid restriction; will continue renvela 800 mg three times daily will monitor  2. Diabetes; hgb a1c is 7.4 will continue  novolog 7 units with meals . Will increase lantus to 8 units Will monitor  3. achalasia of esophagus status post Heller/Toupet on 03-06-15: will continue protonix 40 mg daily; will monitor  4. Dyslipidemia: will continue pravachol 20 mg daily  ldl is 90  5. Anemia of chronic disease: hgb is 11.2; will monitor   6. CAD: has 3 vessel disease is status post cath on 03-24-14: no complaint of chest pain present; will continue asa 81 mg daily   7. Chronic systolic heart failure: is presently stable her EF is 30%-35%; without symptoms of fluid overload present.   8. PAD: will continue asa 81 mg daily is status post left bka  9. Protein calorie malnutrition: will continue supplements per facility protocol her current weight is 77  pounds; her albumin is 2.9  10. Allergic rhinitis: will continue  zyrtec to 10 mg nightly as needed   11. Urine retention: has  long term foley   12. 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    Will  setup screening mammogram; dexa scan and will place her on the eye list     MD is aware of resident's narcotic use and is in agreement with current plan of care. We will attempt to wean resident as appropriate.     Ok Edwards NP [facility]  Contact [phone] Monday through Friday 8am- 5pm  After hours call [phone]

## 2015-12-08 ENCOUNTER — Emergency Department (HOSPITAL_COMMUNITY): Payer: Medicare Other

## 2015-12-08 ENCOUNTER — Other Ambulatory Visit: Payer: Self-pay | Admitting: Internal Medicine

## 2015-12-08 ENCOUNTER — Inpatient Hospital Stay (HOSPITAL_COMMUNITY)
Admission: EM | Admit: 2015-12-08 | Discharge: 2015-12-14 | DRG: 698 | Disposition: A | Discharge status: Skilled Nursing Facility | Payer: Medicare Other | Attending: Internal Medicine | Admitting: Internal Medicine

## 2015-12-08 ENCOUNTER — Encounter (HOSPITAL_COMMUNITY): Payer: Self-pay | Admitting: Emergency Medicine

## 2015-12-08 DIAGNOSIS — Z1231 Encounter for screening mammogram for malignant neoplasm of breast: Secondary | ICD-10-CM

## 2015-12-08 DIAGNOSIS — T83518A Infection and inflammatory reaction due to other urinary catheter, initial encounter: Secondary | ICD-10-CM | POA: Diagnosis present

## 2015-12-08 DIAGNOSIS — N2581 Secondary hyperparathyroidism of renal origin: Secondary | ICD-10-CM | POA: Diagnosis present

## 2015-12-08 DIAGNOSIS — I5042 Chronic combined systolic (congestive) and diastolic (congestive) heart failure: Secondary | ICD-10-CM | POA: Diagnosis present

## 2015-12-08 DIAGNOSIS — I4581 Long QT syndrome: Secondary | ICD-10-CM | POA: Diagnosis present

## 2015-12-08 DIAGNOSIS — N319 Neuromuscular dysfunction of bladder, unspecified: Secondary | ICD-10-CM | POA: Diagnosis present

## 2015-12-08 DIAGNOSIS — E785 Hyperlipidemia, unspecified: Secondary | ICD-10-CM | POA: Diagnosis present

## 2015-12-08 DIAGNOSIS — Z7982 Long term (current) use of aspirin: Secondary | ICD-10-CM | POA: Diagnosis not present

## 2015-12-08 DIAGNOSIS — N186 End stage renal disease: Secondary | ICD-10-CM | POA: Diagnosis present

## 2015-12-08 DIAGNOSIS — E1140 Type 2 diabetes mellitus with diabetic neuropathy, unspecified: Secondary | ICD-10-CM | POA: Diagnosis present

## 2015-12-08 DIAGNOSIS — I132 Hypertensive heart and chronic kidney disease with heart failure and with stage 5 chronic kidney disease, or end stage renal disease: Secondary | ICD-10-CM | POA: Diagnosis present

## 2015-12-08 DIAGNOSIS — R64 Cachexia: Secondary | ICD-10-CM | POA: Diagnosis present

## 2015-12-08 DIAGNOSIS — E1129 Type 2 diabetes mellitus with other diabetic kidney complication: Secondary | ICD-10-CM | POA: Diagnosis present

## 2015-12-08 DIAGNOSIS — R627 Adult failure to thrive: Secondary | ICD-10-CM | POA: Diagnosis present

## 2015-12-08 DIAGNOSIS — Z807 Family history of other malignant neoplasms of lymphoid, hematopoietic and related tissues: Secondary | ICD-10-CM

## 2015-12-08 DIAGNOSIS — Z89519 Acquired absence of unspecified leg below knee: Secondary | ICD-10-CM

## 2015-12-08 DIAGNOSIS — D631 Anemia in chronic kidney disease: Secondary | ICD-10-CM | POA: Diagnosis present

## 2015-12-08 DIAGNOSIS — Z66 Do not resuscitate: Secondary | ICD-10-CM | POA: Diagnosis not present

## 2015-12-08 DIAGNOSIS — Z794 Long term (current) use of insulin: Secondary | ICD-10-CM

## 2015-12-08 DIAGNOSIS — A419 Sepsis, unspecified organism: Secondary | ICD-10-CM | POA: Diagnosis present

## 2015-12-08 DIAGNOSIS — R680 Hypothermia, not associated with low environmental temperature: Secondary | ICD-10-CM | POA: Diagnosis present

## 2015-12-08 DIAGNOSIS — I5032 Chronic diastolic (congestive) heart failure: Secondary | ICD-10-CM | POA: Diagnosis present

## 2015-12-08 DIAGNOSIS — Z8673 Personal history of transient ischemic attack (TIA), and cerebral infarction without residual deficits: Secondary | ICD-10-CM | POA: Diagnosis not present

## 2015-12-08 DIAGNOSIS — Z992 Dependence on renal dialysis: Secondary | ICD-10-CM

## 2015-12-08 DIAGNOSIS — E162 Hypoglycemia, unspecified: Secondary | ICD-10-CM | POA: Diagnosis present

## 2015-12-08 DIAGNOSIS — N390 Urinary tract infection, site not specified: Secondary | ICD-10-CM | POA: Diagnosis present

## 2015-12-08 DIAGNOSIS — L89154 Pressure ulcer of sacral region, stage 4: Secondary | ICD-10-CM | POA: Diagnosis present

## 2015-12-08 DIAGNOSIS — E1122 Type 2 diabetes mellitus with diabetic chronic kidney disease: Secondary | ICD-10-CM | POA: Diagnosis present

## 2015-12-08 DIAGNOSIS — I2510 Atherosclerotic heart disease of native coronary artery without angina pectoris: Secondary | ICD-10-CM | POA: Diagnosis present

## 2015-12-08 DIAGNOSIS — Z89512 Acquired absence of left leg below knee: Secondary | ICD-10-CM | POA: Diagnosis not present

## 2015-12-08 DIAGNOSIS — Z8249 Family history of ischemic heart disease and other diseases of the circulatory system: Secondary | ICD-10-CM | POA: Diagnosis not present

## 2015-12-08 DIAGNOSIS — E2839 Other primary ovarian failure: Secondary | ICD-10-CM

## 2015-12-08 DIAGNOSIS — Z515 Encounter for palliative care: Secondary | ICD-10-CM

## 2015-12-08 DIAGNOSIS — T68XXXA Hypothermia, initial encounter: Secondary | ICD-10-CM | POA: Diagnosis present

## 2015-12-08 DIAGNOSIS — E11649 Type 2 diabetes mellitus with hypoglycemia without coma: Secondary | ICD-10-CM | POA: Diagnosis present

## 2015-12-08 DIAGNOSIS — Y846 Urinary catheterization as the cause of abnormal reaction of the patient, or of later complication, without mention of misadventure at the time of the procedure: Secondary | ICD-10-CM | POA: Diagnosis present

## 2015-12-08 DIAGNOSIS — Z7951 Long term (current) use of inhaled steroids: Secondary | ICD-10-CM | POA: Diagnosis not present

## 2015-12-08 DIAGNOSIS — Z681 Body mass index (BMI) 19 or less, adult: Secondary | ICD-10-CM

## 2015-12-08 LAB — PROCALCITONIN: Procalcitonin: 7.04 ng/mL

## 2015-12-08 LAB — COMPREHENSIVE METABOLIC PANEL
ALK PHOS: 123 U/L (ref 38–126)
ALT: 28 U/L (ref 14–54)
ANION GAP: 13 (ref 5–15)
AST: 24 U/L (ref 15–41)
Albumin: 2.8 g/dL — ABNORMAL LOW (ref 3.5–5.0)
BUN: 25 mg/dL — ABNORMAL HIGH (ref 6–20)
CALCIUM: 8.1 mg/dL — AB (ref 8.9–10.3)
CO2: 24 mmol/L (ref 22–32)
CREATININE: 3.09 mg/dL — AB (ref 0.44–1.00)
Chloride: 98 mmol/L — ABNORMAL LOW (ref 101–111)
GFR, EST AFRICAN AMERICAN: 16 mL/min — AB (ref >60)
GFR, EST NON AFRICAN AMERICAN: 14 mL/min — AB (ref >60)
Glucose, Bld: 125 mg/dL — ABNORMAL HIGH (ref 65–99)
Potassium: 3.2 mmol/L — ABNORMAL LOW (ref 3.5–5.1)
SODIUM: 135 mmol/L (ref 135–145)
Total Bilirubin: 0.6 mg/dL (ref 0.3–1.2)
Total Protein: 7.2 g/dL (ref 6.5–8.1)

## 2015-12-08 LAB — LACTIC ACID, PLASMA: Lactic Acid, Venous: 1.9 mmol/L (ref 0.5–1.9)

## 2015-12-08 LAB — CBG MONITORING, ED
GLUCOSE-CAPILLARY: 69 mg/dL (ref 65–99)
Glucose-Capillary: 186 mg/dL — ABNORMAL HIGH (ref 65–99)
Glucose-Capillary: 124 mg/dL — ABNORMAL HIGH (ref 65–99)

## 2015-12-08 LAB — URINE MICROSCOPIC-ADD ON

## 2015-12-08 LAB — PROTIME-INR
INR: 1.21
Prothrombin Time: 15.4 seconds — ABNORMAL HIGH (ref 11.4–15.2)

## 2015-12-08 LAB — URINALYSIS, ROUTINE W REFLEX MICROSCOPIC
GLUCOSE, UA: NEGATIVE mg/dL
KETONES UR: NEGATIVE mg/dL
Nitrite: NEGATIVE
Specific Gravity, Urine: 1.013 (ref 1.005–1.030)
pH: 8.5 — ABNORMAL HIGH (ref 5.0–8.0)

## 2015-12-08 LAB — CBC WITH DIFFERENTIAL/PLATELET
Basophils Absolute: 0 10*3/uL (ref 0.0–0.1)
Basophils Relative: 0 %
EOS ABS: 0.2 10*3/uL (ref 0.0–0.7)
EOS PCT: 1 %
HCT: 34.4 % — ABNORMAL LOW (ref 36.0–46.0)
HEMOGLOBIN: 11.4 g/dL — AB (ref 12.0–15.0)
LYMPHS ABS: 0.4 10*3/uL — AB (ref 0.7–4.0)
LYMPHS PCT: 3 %
MCH: 27.9 pg (ref 26.0–34.0)
MCHC: 33.1 g/dL (ref 30.0–36.0)
MCV: 84.1 fL (ref 78.0–100.0)
MONOS PCT: 5 %
Monocytes Absolute: 0.6 10*3/uL (ref 0.1–1.0)
Neutro Abs: 12.1 10*3/uL — ABNORMAL HIGH (ref 1.7–7.7)
Neutrophils Relative %: 91 %
PLATELETS: 139 10*3/uL — AB (ref 150–400)
RBC: 4.09 MIL/uL (ref 3.87–5.11)
RDW: 16.5 % — ABNORMAL HIGH (ref 11.5–15.5)
WBC: 13.2 10*3/uL — ABNORMAL HIGH (ref 4.0–10.5)

## 2015-12-08 LAB — LIPASE, BLOOD: Lipase: 17 U/L (ref 11–51)

## 2015-12-08 LAB — I-STAT CG4 LACTIC ACID, ED
LACTIC ACID, VENOUS: 2.38 mmol/L — AB (ref 0.5–1.9)
LACTIC ACID, VENOUS: 1.74 mmol/L (ref 0.5–1.9)

## 2015-12-08 LAB — GLUCOSE, CAPILLARY: GLUCOSE-CAPILLARY: 81 mg/dL (ref 65–99)

## 2015-12-08 LAB — APTT: APTT: 34 s (ref 24–36)

## 2015-12-08 LAB — I-STAT TROPONIN, ED: TROPONIN I, POC: 0.03 ng/mL (ref 0.00–0.08)

## 2015-12-08 MED ORDER — DOCUSATE SODIUM 283 MG RE ENEM
1.0000 | ENEMA | RECTAL | Status: DC | PRN
  Filled 2015-12-08: qty 1

## 2015-12-08 MED ORDER — NEPRO/CARBSTEADY PO LIQD: 237.0000 mL | Freq: Three times a day (TID) | ORAL | Status: DC | PRN

## 2015-12-08 MED ORDER — ONDANSETRON HCL 4 MG PO TABS
4.0000 mg | ORAL_TABLET | Freq: Four times a day (QID) | ORAL | Status: DC | PRN
  Administered 2015-12-09: 4 mg via ORAL
  Filled 2015-12-08: qty 1

## 2015-12-08 MED ORDER — SORBITOL 70 % SOLN: 30.0000 mL | Status: DC | PRN

## 2015-12-08 MED ORDER — LIDOCAINE-PRILOCAINE 2.5-2.5 % EX KIT: 1.0000 "application " | PACK | CUTANEOUS | Status: DC

## 2015-12-08 MED ORDER — PROMETHAZINE HCL 25 MG PO TABS
25.0000 mg | ORAL_TABLET | Freq: Four times a day (QID) | ORAL | Status: DC | PRN
  Administered 2015-12-12: 25 mg via ORAL
  Filled 2015-12-08 (×2): qty 1

## 2015-12-08 MED ORDER — DECUBI-VITE PO CAPS: 2.0000 | ORAL_CAPSULE | Freq: Every day | ORAL | Status: DC

## 2015-12-08 MED ORDER — HYDROXYZINE HCL 25 MG PO TABS: 25.0000 mg | ORAL_TABLET | Freq: Three times a day (TID) | ORAL | Status: DC | PRN

## 2015-12-08 MED ORDER — VITAMIN C 500 MG PO TABS
500.0000 mg | ORAL_TABLET | Freq: Two times a day (BID) | ORAL | Status: DC
  Administered 2015-12-12 – 2015-12-14 (×3): 500 mg via ORAL
  Filled 2015-12-08 (×6): qty 1

## 2015-12-08 MED ORDER — CAMPHOR-MENTHOL 0.5-0.5 % EX LOTN
1.0000 "application " | TOPICAL_LOTION | Freq: Three times a day (TID) | CUTANEOUS | Status: DC | PRN
  Administered 2015-12-13: 1 via TOPICAL
  Filled 2015-12-08: qty 222

## 2015-12-08 MED ORDER — PRAVASTATIN SODIUM 20 MG PO TABS
20.0000 mg | ORAL_TABLET | Freq: Every evening | ORAL | Status: DC
  Administered 2015-12-12: 20 mg via ORAL
  Filled 2015-12-08 (×3): qty 1

## 2015-12-08 MED ORDER — DEXTROSE 5 % IV SOLN
1.0000 g | Freq: Every day | INTRAVENOUS | Status: DC
  Administered 2015-12-09 – 2015-12-12 (×4): 1 g via INTRAVENOUS
  Filled 2015-12-08 (×4): qty 1

## 2015-12-08 MED ORDER — RENA-VITE PO TABS
1.0000 | ORAL_TABLET | Freq: Every day | ORAL | Status: DC
  Filled 2015-12-08: qty 1

## 2015-12-08 MED ORDER — ACETAMINOPHEN 650 MG RE SUPP: 650.0000 mg | Freq: Four times a day (QID) | RECTAL | Status: DC | PRN

## 2015-12-08 MED ORDER — ENOXAPARIN SODIUM 30 MG/0.3ML ~~LOC~~ SOLN
20.0000 mg | Freq: Every day | SUBCUTANEOUS | Status: DC
  Filled 2015-12-08 (×3): qty 0.2

## 2015-12-08 MED ORDER — DEXTROSE 5 % IV SOLN: 2.0000 g | Freq: Once | INTRAVENOUS | Status: DC

## 2015-12-08 MED ORDER — DOCUSATE SODIUM 100 MG PO CAPS
100.0000 mg | ORAL_CAPSULE | Freq: Two times a day (BID) | ORAL | Status: DC
  Filled 2015-12-08 (×3): qty 1

## 2015-12-08 MED ORDER — DEXTROSE-NACL 5-0.45 % IV SOLN
INTRAVENOUS | Status: DC
  Administered 2015-12-08 – 2015-12-09 (×2): via INTRAVENOUS

## 2015-12-08 MED ORDER — ACETAMINOPHEN 325 MG PO TABS: 650.0000 mg | ORAL_TABLET | Freq: Four times a day (QID) | ORAL | Status: DC | PRN

## 2015-12-08 MED ORDER — PANTOPRAZOLE SODIUM 40 MG PO TBEC
40.0000 mg | DELAYED_RELEASE_TABLET | Freq: Every day | ORAL | Status: DC
  Administered 2015-12-12 – 2015-12-14 (×2): 40 mg via ORAL
  Filled 2015-12-08 (×5): qty 1

## 2015-12-08 MED ORDER — INSULIN ASPART 100 UNIT/ML ~~LOC~~ SOLN
0.0000 [IU] | Freq: Three times a day (TID) | SUBCUTANEOUS | Status: DC
  Administered 2015-12-09: 3 [IU] via SUBCUTANEOUS

## 2015-12-08 MED ORDER — OXYCODONE-ACETAMINOPHEN 5-325 MG PO TABS
1.0000 | ORAL_TABLET | Freq: Three times a day (TID) | ORAL | Status: DC | PRN
  Administered 2015-12-12: 1 via ORAL
  Filled 2015-12-08: qty 1

## 2015-12-08 MED ORDER — FLUTICASONE PROPIONATE 50 MCG/ACT NA SUSP
1.0000 | Freq: Two times a day (BID) | NASAL | Status: DC
  Filled 2015-12-08: qty 16

## 2015-12-08 MED ORDER — ONDANSETRON HCL 4 MG/2ML IJ SOLN
4.0000 mg | Freq: Four times a day (QID) | INTRAMUSCULAR | Status: DC | PRN
  Administered 2015-12-09 – 2015-12-14 (×13): 4 mg via INTRAVENOUS
  Filled 2015-12-08 (×16): qty 2

## 2015-12-08 MED ORDER — SEVELAMER CARBONATE 800 MG PO TABS
1600.0000 mg | ORAL_TABLET | Freq: Three times a day (TID) | ORAL | Status: DC
  Administered 2015-12-10 – 2015-12-14 (×5): 1600 mg via ORAL
  Filled 2015-12-08 (×7): qty 2

## 2015-12-08 MED ORDER — CALCIUM CARBONATE ANTACID 1250 MG/5ML PO SUSP
500.0000 mg | Freq: Four times a day (QID) | ORAL | Status: DC | PRN
  Filled 2015-12-08: qty 5

## 2015-12-08 MED ORDER — ASPIRIN EC 81 MG PO TBEC
81.0000 mg | DELAYED_RELEASE_TABLET | Freq: Every day | ORAL | Status: DC
  Administered 2015-12-12 – 2015-12-14 (×2): 81 mg via ORAL
  Filled 2015-12-08 (×4): qty 1

## 2015-12-08 MED ORDER — SENNA 8.6 MG PO TABS
1.0000 | ORAL_TABLET | Freq: Two times a day (BID) | ORAL | Status: DC
  Administered 2015-12-12: 8.6 mg via ORAL
  Filled 2015-12-08 (×4): qty 1

## 2015-12-08 MED ORDER — CEFTRIAXONE SODIUM 1 G IJ SOLR
1.0000 g | Freq: Once | INTRAMUSCULAR | Status: AC
  Administered 2015-12-08: 1 g via INTRAVENOUS
  Filled 2015-12-08: qty 10

## 2015-12-08 NOTE — ED Provider Notes (Signed)
[facility]-EMER[facility]ENCY DEPT Provider Note   CSN: [id number] Arrival date & time: 12/08/15  1650     History   Chief Complaint Chief Complaint  Patient presents with  . Hypoglycemia    HPI Elizabeth [name] is a 70 y.o. female.   Hypoglycemia  Initial blood sugar:  36 Blood sugar after intervention:  39 then 300+ Severity:  Moderate Onset quality:  Sudden Duration:  1 hour Timing:  Constant Progression:  Resolved Chronicity:  New Diabetic status:  Controlled with insulin Current diabetic therapy:  Long and short acting insuling. Time since last antidiabetic medication:  10 hours Context: decreased oral intake   Relieved by:  Glucagon and IV glucose Associated symptoms: no altered mental status, no anxiety, no decreased responsiveness, no dizziness, no seizures, no shortness of breath, no vomiting and no weakness     Past Medical History:  Diagnosis Date  . Acute hyperkalemia 12/24/2013  . Anemia   . Arthritis of knee, right    "right knee" (06/12/2014)  . Coronary artery disease, non-occlusive January 2016   Moderate p&mLAD & Ostial OM; Myoview Nuclear Stress Test 03/21/2014: EF 35%. Mild hypokinesis of the inferior wall with fixed inferior defect suggesting scar. (no culprit lesion on CATH)  . Critical lower limb ischemia    Status post left BKA following left popliteal and peroneal artery endarterectomy with patch angioplasty.  . Diabetes mellitus, insulin dependent (IDDM), uncontrolled (HCC)    Brittle; has h/o Hypo & Hyperglycemic episodes, Type II  . Diabetic neuropathy (HCC)   . ESRD (end stage renal disease) (HCC)    [address], [place], [place] (06/12/2014)  . Foley catheter in place    for urinary retention  . Foot infection   . Gout    "was in my LLE"  . Hematemesis April 2016   Hospitalized  . History of CVA (cerebrovascular accident) 08/19/2013   "minor one"  . Hyperlipidemia with target LDL less than 70   . Loss of consciousness (HCC) 08/19/2013  . 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 Urinary retention    DX NEUROGENIC BLADDER--HAS INDWELLING FOLEY CATHETER  . UTI (urinary tract infection) 10/17/2013    Patient Active Problem List   Diagnosis Date Noted  . Chronic diastolic congestive heart failure (HCC) 08/06/2015  . Dyslipidemia associated with type 2 diabetes mellitus (HCC) 03/16/2015  . 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 Venous stenosis of left upper extremity 02/06/2015  . 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 DM (diabetes mellitus) type II controlled with renal manifestation (HCC) 06/30/2014  . S/P BKA (below knee amputation) unilateral (HCC) 05/28/2014  . Peripheral artery disease (HCC) 04/16/2014  . Moderate 3V CAD at cath 03/24/14   . Cardiomyopathy, ischemic   . Neuropathy (HCC) 03/17/2014  . Protein-calorie malnutrition, severe (HCC) 12/27/2013  . ESRD on hemodialysis (HCC) 10/17/2013  . Anemia of chronic disease 04/24/2012    Past Surgical History:  Procedure Laterality Date  . ABDOMINAL AORTAGRAM N/A 03/19/2014   Procedure: ABDOMINAL Ronny Flurry;  Surgeon: Sherren Kerns, MD;  Location: [facility] CATH LAB;  Service: Cardiovascular;  Laterality: N/A;  . AMPUTATION Left 03/26/2014   Procedure: AMPUTATION SECOND LEFT TOE;  Surgeon: Sherren Kerns, MD;  Location: [facility] OR;  Service: Vascular;  Laterality: Left;  . AMPUTATION Left 05/10/2014  Procedure: AMPUTATION BELOW KNEE;  Surgeon: Larina Earthlyodd F Early, MD;  [facility];  Service: Vascular;  Laterality: Left;  . ANGIOPLASTY Left 04/29/2013   Procedure: ANGIOPLASTY;  Surgeon: Chuck Hinthristopher S Dickson, MD;  [facility];  Service: Cardiovascular;  Laterality: Left;  . APPLICATION OF A-CELL OF EXTREMITY N/A 10/29/2014   Procedure: PLACEMENT OF APPLICATION OF  A-CELL;  Surgeon: Wayland Denislaire Sanger, DO;  Location: [facility] OR;  Service: Plastics;  Laterality: N/A;  . AV FISTULA PLACEMENT Left 04/28/2014   Procedure: CREATION OF LEFT BRACHIOCEPHALIC  ARTERIOVENOUS (AV) FISTULA CREATION;  Surgeon: Fransisco HertzBrian L Chen, MD;  Location: [facility] OR;  Service: Vascular;  Laterality: Left;  . BASCILIC VEIN TRANSPOSITION Left 10/30/2012   Procedure: LEFT 1ST STAGE BASCILIC VEIN TRANSPOSITION;  Surgeon: Chuck Hinthristopher S Dickson, MD;  [facility];  Service: Vascular;  Laterality: Left;  . BASCILIC VEIN TRANSPOSITION Left 02/05/2013   Procedure: BASCILIC VEIN TRANSPOSITION 2ND STAGE;  Surgeon: Chuck Hinthristopher S Dickson, MD;  [facility];  Service: Vascular;  Laterality: Left;  . BELOW KNEE LEG AMPUTATION Left   . ENDARTERECTOMY POPLITEAL Left 03/26/2014   Procedure: Left popliteal and peroneal artery endarterectomy with vein patch angioplasty;  Surgeon: Sherren Kernsharles E Fields, MD;  [facility];  Service: Vascular;  Laterality: Left;  . ESOPHAGEAL MANOMETRY N/A 11/24/2014   Procedure: ESOPHAGEAL MANOMETRY (EM);  Surgeon: Jeani HawkingPatrick Hung, MD;  Location: [facility] ENDOSCOPY;  Service: Endoscopy;  Laterality: N/A;  . ESOPHAGOGASTRODUODENOSCOPY N/A 06/13/2014   Procedure: ESOPHAGOGASTRODUODENOSCOPY (EGD);  Surgeon: Jeani HawkingPatrick Hung, MD;  [facility];  Service: Endoscopy;  Laterality: N/A;  . FISTULOGRAM Left 04/29/2013   Procedure: FISTULOGRAM;  Surgeon: Chuck Hinthristopher S Dickson, MD;  [facility];  Service: Cardiovascular;  Laterality: Left;  . FISTULOGRAM Left 10/08/2014   Procedure: LEFT ARM FISTULOGRAM;  Surgeon: Fransisco HertzBrian L Chen, MD;  [facility];  Service: Vascular;  Laterality: Left;  . INCISION AND DRAINAGE OF WOUND N/A 10/29/2014   Procedure: IRRIGATION AND DEBRIDEMENT OF SACRAL WOUND, PLACEMENT OF A-CELL;  Surgeon: Wayland Denislaire Sanger, DO;  Location: [facility] OR;  Service: Plastics;  Laterality: N/A;  . INSERTION OF SUPRAPUBIC CATHETER N/A 05/16/2012   Procedure: INSERTION OF SUPRAPUBIC CATHETER;  Surgeon:  Sebastian Acheheodore Manny, MD;  Location: [facility] ORS;  Service: Urology;  Laterality: N/A;  . LEFT AND RIGHT HEART CATHETERIZATION WITH CORONARY ANGIOGRAM N/A 03/24/2014   Procedure: LEFT AND RIGHT HEART CATHETERIZATION WITH CORONARY ANGIOGRAM;  Surgeon: Marykay Lexavid W Harding, MD;  [facility];  Service: Cardiovascular; Moderate disease in p-mLAD & Ostial OM1; normal right heart pressures. Wedge pressure 29 mmHg. --No lesion to explain inferior defect on Myoview and echocardiogram.   . NM MYOCAR PERF WALL MOTION  04/19/2012/ 02/2014   a) low risk study-EF 44%,mild inferolateral hypkinesis; b) EF 35%. Mild hypokinesis of the inferior wall with fixed inferior defect suggesting scar.  Marland Kitchen. RADIOLOGY WITH ANESTHESIA N/A 10/25/2013   Procedure: RADIOLOGY WITH ANESTHESIA;  Surgeon: Medication Radiologist, MD;  Location: [facility] OR;  Service: Radiology;  Laterality: N/A;  . TRANSTHORACIC ECHOCARDIOGRAM  03/22/2014   EF 30-35%, mild LVH. Severe inferior hypokinesis. Mild MR.  Marland Kitchen. UPPER GI ENDOSCOPY  03/06/2015   Procedure: UPPER GI ENDOSCOPY;  Surgeon: Karie SodaSteven Gross, MD;  Location: [facility] ORS;  Service: General;;  . VENOPLASTY Left 10/08/2014   Procedure: LEFT CEPHALIC VEIN VENOPLASTY;  Surgeon: Fransisco HertzBrian L Chen, MD;  [facility];  Service: Vascular;  Laterality: Left;    OB History    No data available  Home Medications    Prior to Admission medications   Medication Sig Start Date End Date Taking? Authorizing Provider  ascorbic acid (VITAMIN C) 500 MG tablet Take 500 mg by mouth 2 (two) times daily.    Historical Provider, MD  aspirin EC 81 MG tablet Take 81 mg by mouth daily.    Historical Provider, MD  cetirizine (ZYRTEC) 10 MG tablet Take 10 mg by mouth daily as needed for allergies.     Historical Provider, MD  fluticasone (FLONASE) 50 MCG/ACT nasal spray Place 1 spray into both nostrils 2 (two) times daily.    Historical Provider, MD  insulin aspart (NOVOLOG) 100 UNIT/ML injection Inject as per sliding scale: if 0-69  =0 units; 70+ = 7 units, subcutaneously before meals related to DM.    Historical Provider, MD  insulin glargine (LANTUS) 100 UNIT/ML injection Inject 6 Units into the skin every morning.    Historical Provider, MD  lidocaine-prilocaine (EMLA) cream Apply 1 application topically Every Tuesday,Thursday,and Saturday with dialysis.     Historical Provider, MD  Multiple Vitamins-Minerals (DECUBI-VITE) CAPS Take 2 capsules by mouth daily.    Historical Provider, MD  Nutritional Supplements (NUTRITIONAL SUPPLEMENT PO) Take by mouth. Prostat 120 cc TID Mech Soft texture    Historical Provider, MD  oxyCODONE-acetaminophen (PERCOCET/ROXICET) 5-325 MG tablet Take 1 tablet by mouth every 8 (eight) hours as needed for severe pain.    Historical Provider, MD  pantoprazole (PROTONIX) 40 MG tablet Take 1 tablet (40 mg total) by mouth daily. 05/13/14   Joseph Art, DO  pravastatin (PRAVACHOL) 20 MG tablet Take 20 mg by mouth every evening.     Historical Provider, MD  promethazine (PHENERGAN) 25 MG tablet Take 25 mg by mouth every 6 (six) hours as needed for nausea or vomiting.    Historical Provider, MD  senna (SENOKOT) 8.6 MG tablet Take 1 tablet by mouth 2 (two) times daily.    Historical Provider, MD  sevelamer carbonate (RENVELA) 800 MG tablet Take 1,600 mg by mouth 3 (three) times daily with meals.    Historical Provider, MD    Family History Family History  Problem Relation Age of Onset  . Hyperlipidemia Mother   . Hypertension Mother   . Hodgkin's lymphoma Father   . Heart disease Sister     Social History Social History  Substance Use Topics  . Smoking status: Never Smoker  . Smokeless tobacco: Never Used  . Alcohol use No     Comment: "stopped drinking in 2013 drank a couple beers/day, 2 days/wk"     Allergies   Review of patient's allergies indicates no known allergies.   Review of Systems Review of Systems  Constitutional: Negative for chills, decreased responsiveness and fever.    HENT: Negative for ear pain and sore throat.   Eyes: Negative for pain and visual disturbance.  Respiratory: Negative for cough and shortness of breath.   Cardiovascular: Negative for chest pain and palpitations.  Gastrointestinal: Negative for abdominal pain and vomiting.  Genitourinary: Negative for dysuria and hematuria.  Musculoskeletal: Negative for arthralgias and back pain.  Skin: Negative for color change and rash.  Neurological: Negative for dizziness, seizures, syncope and weakness.  Psychiatric/Behavioral: The patient is not nervous/anxious.   All other systems reviewed and are negative.    Physical Exam Updated Vital Signs BP 146/71   Pulse 81   Resp 20   SpO2 100%   Physical Exam  Constitutional: She is oriented to person, place, and time.  She appears well-developed and well-nourished.  HENT:  Head: Normocephalic and atraumatic.  Eyes: Conjunctivae and EOM are normal. Pupils are equal, round, and reactive to light.  Neck: Normal range of motion. Neck supple.  Cardiovascular: Normal rate and regular rhythm.   No murmur heard. Pulmonary/Chest: Effort normal and breath sounds normal. No respiratory distress.  Abdominal: Soft. There is no tenderness.  Genitourinary:  Genitourinary Comments: Indwelling foley  Musculoskeletal: She exhibits no edema.  Neurological: She is alert and oriented to person, place, and time.  Skin: Skin is warm and dry.  Psychiatric: She has a normal mood and affect.  Nursing note and vitals reviewed.    ED Treatments / Results  Labs (all labs ordered are listed, but only abnormal results are displayed) Labs Reviewed  CBC WITH DIFFERENTIAL/PLATELET - Abnormal; Notable for the following:       Result Value   WBC 13.2 (*)    Hemoglobin 11.4 (*)    HCT 34.4 (*)    RDW 16.5 (*)    Platelets 139 (*)    Neutro Abs 12.1 (*)    Lymphs Abs 0.4 (*)    All other components within normal limits  COMPREHENSIVE METABOLIC PANEL - Abnormal;  Notable for the following:    Potassium 3.2 (*)    Chloride 98 (*)    Glucose, Bld 125 (*)    BUN 25 (*)    Creatinine, Ser 3.09 (*)    Calcium 8.1 (*)    Albumin 2.8 (*)    GFR calc non Af Amer 14 (*)    GFR calc Af Amer 16 (*)    All other components within normal limits  URINALYSIS, ROUTINE W REFLEX MICROSCOPIC (NOT AT [facility]) - Abnormal; Notable for the following:    Color, Urine RED (*)    APPearance TURBID (*)    pH 8.5 (*)    Hgb urine dipstick LARGE (*)    Bilirubin Urine SMALL (*)    Protein, ur >300 (*)    Leukocytes, UA LARGE (*)    All other components within normal limits  URINE MICROSCOPIC-ADD ON - Abnormal; Notable for the following:    Squamous Epithelial / LPF 6-30 (*)    Bacteria, UA MANY (*)    Crystals TRIPLE PHOSPHATE CRYSTALS (*)    All other components within normal limits  PROTIME-INR - Abnormal; Notable for the following:    Prothrombin Time 15.4 (*)    All other components within normal limits  CBG MONITORING, ED - Abnormal; Notable for the following:    Glucose-Capillary 186 (*)    All other components within normal limits  I-STAT CG4 LACTIC ACID, ED - Abnormal; Notable for the following:    Lactic Acid, Venous 2.38 (*)    All other components within normal limits  CBG MONITORING, ED - Abnormal; Notable for the following:    Glucose-Capillary 124 (*)    All other components within normal limits  CULTURE, BLOOD (ROUTINE X 2)  CULTURE, BLOOD (ROUTINE X 2)  URINE CULTURE  MRSA PCR SCREENING  LIPASE, BLOOD  LACTIC ACID, PLASMA  PROCALCITONIN  APTT  GLUCOSE, CAPILLARY  LACTIC ACID, PLASMA  BASIC METABOLIC PANEL  CBC  I-STAT TROPOININ, ED  CBG MONITORING, ED  CBG MONITORING, ED  I-STAT CG4 LACTIC ACID, ED  CBG MONITORING, ED    EKG  EKG Interpretation  Date/Time:  Tuesday December 08 2015 17:43:03 EDT Ventricular Rate:  78 PR Interval:    QRS Duration: 100 QT Interval:  458 QTC Calculation: 522 R Axis:   -38 Text Interpretation:  Age  not entered, assumed to be  70 years old for purpose of ECG interpretation Sinus rhythm Left ventricular hypertrophy Anterior Q waves, possibly due to LVH Prolonged QT interval No significant change since last tracing Confirmed by [facility] MD, JASON [phone]) on 12/08/2015 5:53:25 PM       Radiology Dg Chest Port 1 View  Result Date: 12/08/2015 CLINICAL DATA:  Sepsis and hypothermia EXAM: PORTABLE CHEST 1 VIEW COMPARISON:  Chest radiograph 03/09/2015 FINDINGS: There is calcification within the aortic arch. Small bilateral pleural effusions. The right-sided effusion has increased in size. There diffusely increased pulmonary markings throughout both lungs. No focal area of consolidation. No pneumothorax. IMPRESSION: Slightly increased size of right pleural effusion. Unchanged left pleural effusion. No focal airspace consolidation. Electronically Signed   By: Deatra Robinson M.D.   On: 12/08/2015 18:04    Procedures Procedures (including critical care time)  Medications Ordered in ED Medications  dextrose 5 %-0.45 % sodium chloride infusion ( Intravenous Transfusing/Transfer 12/08/15 2204)  senna (SENOKOT) tablet 8.6 mg (not administered)  sevelamer carbonate (RENVELA) tablet 1,600 mg (not administered)  vitamin C (ASCORBIC ACID) tablet 500 mg (not administered)  fluticasone (FLONASE) 50 MCG/ACT nasal spray 1 spray (not administered)  oxyCODONE-acetaminophen (PERCOCET/ROXICET) 5-325 MG per tablet 1 tablet (not administered)  aspirin EC tablet 81 mg (not administered)  promethazine (PHENERGAN) tablet 25 mg (not administered)  lidocaine-prilocaine (EMLA) cream 1 application (not administered)  pantoprazole (PROTONIX) EC tablet 40 mg (not administered)  pravastatin (PRAVACHOL) tablet 20 mg (not administered)  acetaminophen (TYLENOL) tablet 650 mg (not administered)    Or  acetaminophen (TYLENOL) suppository 650 mg (not administered)  docusate sodium (COLACE) capsule 100 mg (not administered)    ondansetron (ZOFRAN) tablet 4 mg (not administered)    Or  ondansetron (ZOFRAN) injection 4 mg (not administered)  enoxaparin (LOVENOX) injection 20 mg (not administered)  insulin aspart (novoLOG) injection 0-9 Units (not administered)  sorbitol 70 % solution 30 mL (not administered)  docusate sodium (ENEMEEZ) enema 283 mg (not administered)  camphor-menthol (SARNA) lotion 1 application (not administered)    And  hydrOXYzine (ATARAX/VISTARIL) tablet 25 mg (not administered)  calcium carbonate (dosed in mg elemental calcium) suspension 500 mg of elemental calcium (not administered)  feeding supplement (NEPRO CARB STEADY) liquid 237 mL (not administered)  multivitamin (RENA-VIT) tablet 1 tablet (not administered)  ceFEPIme (MAXIPIME) 1 g in dextrose 5 % 50 mL IVPB (not administered)  cefTRIAXone (ROCEPHIN) 1 g in dextrose 5 % 50 mL IVPB (1 g Intravenous New Bag/Given 12/08/15 2113)     Initial Impression / Assessment and Plan / ED Course  I have reviewed the triage vital signs and the nursing notes.  Pertinent labs & imaging results that were available during my care of the patient were reviewed by me and considered in my medical decision making (see chart for details).  Clinical Course    Ms Lipton is a 70 year old female with PMH significant for CAD, dialysis dependent ESRD, diabetes, below the knee amputation who presents for hypoglycemic episode.  She took her insulin as normal in the morning, went to dialysis and after returning felt unwell.  FSBG was low (30's). EMS was called and glucagon given followed by D10 infusion.  On arrival, patient is well appearing and without complaint.  Noted to be hypothermic. Rewarming started with Bair hugger.  EKG obtained demonstrates sinus rhythm with LVH, no change from prior.  Labs ordered, including blood glucose monitoring, urine studies, troponin, lactate, blood cultures, CBC, CMP and lipase.  Results significant for mildly elevated  lactate, normal/high glucose, urine with large leukocytes and many bacteria.  D5 1/2 NS infusion started.  Rocephin given for urosepsis.  CXR obtained, personally reviewed by me, demonstrates sligh increase of right pleural effusion without consolidation. Doubt PNA.  Patient admitted for further management of urosepsis.  Final Clinical Impressions(s) / ED Diagnoses   Final diagnoses:  Hypoglycemia  Sepsis, due to unspecified organism (HCC)  Hypothermia, initial encounter    New Prescriptions New Prescriptions   No medications on file     Garey Ham, MD 12/09/15 1610    Marily Memos, MD 12/10/15 1529

## 2015-12-08 NOTE — Progress Notes (Signed)
Pharmacy Antibiotic Note  Lezlee Ekberg is a 70 y.o. female admitted on 12/08/2015 with hypoglycemia, ESRD HD TTSat, indwelling foley in place, with possible UTI.  Pharmacy has been consulted for Cefepime dosing.  Plan: Cefepime 1 g IV q24h  Height: 5\' 3"  (160 cm) Weight: 79 lb (35.8 kg) IBW/kg (Calculated) : 52.4  Temp (24hrs), Avg:96.1 F (35.6 C), Min:93.6 F (34.2 C), Max:98.6 F (37 C)   Recent Labs Lab 12/08/15 1752 12/08/15 1802 12/08/15 2035  WBC 13.2*  --   --   CREATININE 3.09*  --   --   LATICACIDVEN  --  2.38* 1.74    Estimated Creatinine Clearance: 9.4 mL/min (by C-G formula based on SCr of 3.09 mg/dL (H)).    No Known Allergies   Eddie Candle 12/08/2015 10:50 PM

## 2015-12-08 NOTE — ED Notes (Signed)
Patient provided meal.

## 2015-12-08 NOTE — ED Triage Notes (Signed)
Pt arrives via [facility] from [facility], pt returned back to [facility] after dialysis and was found to have a CBG of 36, received 1mg  glucagon which brought CBG to 39. Pt received 250cc of D10,  CBG 306 10 mins pta. A/ox4. Pt has no other c/o.

## 2015-12-08 NOTE — ED Notes (Signed)
MD at bedside. 

## 2015-12-08 NOTE — H&P (Signed)
History and Physical    Elizabeth [name] WUJ:[id number] DOB: 1945-08-03 DOA: 12/08/2015  PCP: Kirt Boys, DO Consultants:  [facility] - ophthalmology Patient coming from: [facility]; NOK: granddaughter  Chief Complaint: hypoglycemia  HPI: Elizabeth [name] is a 70 y.o. female with medical history significant of ESRD on Tu/Th/Sat HD, HLD, h/o CVA, chronic indwelling foley, DM, PVD s/p amputation presenting with hypoglycemia.  Patient reports that her blood sugar dropped to 33 after dialysis today.  Did eat breakfast, did not eat lunch - sugar low when she got back to facility and so she came in.  Had been feeling well up to that point including at dialysis.  After she finished HD, she started feeling bad.  Dizziness.  Has a foley despite HD, still makes some urine.  Has not noticed urinary symptoms.  No fevers.     ED Course: Per Dr. Clayborne Dana: 70 yo F here with hypoglycemia. She thinks it is because she got both long and short acting insulins last night. However hypoglycemia was this morning. Improved with glucose and d50 to >300, came back down to mid 100's here. On exam, she has a LLE amputation , catheter in place. No wounds. Lungs clear. Heart normal. Hypothermic and tachypneic though.  Bare hugger started. Will eval for sepsis and myxedema. Likely needs admission.   Review of Systems: As per HPI; otherwise 10 point review of systems reviewed and negative.   Ambulatory Status: uses wheelchair  Past Medical History:  Diagnosis Date  . Acute hyperkalemia 12/24/2013  . Anemia   . Arthritis of knee, right    "right knee" (06/12/2014)  . Coronary artery disease, non-occlusive January 2016   Moderate p&mLAD & Ostial OM; Myoview Nuclear Stress Test 03/21/2014: EF 35%. Mild hypokinesis of the inferior wall with fixed inferior defect suggesting scar. (no culprit lesion on CATH)  . Critical lower limb ischemia    Status post left BKA following left popliteal and peroneal artery  endarterectomy with patch angioplasty.  . Diabetes mellitus, insulin dependent (IDDM), uncontrolled (HCC)    Brittle; has h/o Hypo & Hyperglycemic episodes, Type II  . Diabetic neuropathy (HCC)   . ESRD (end stage renal disease) (HCC)    [address], [place], [place] (06/12/2014)  . Foley catheter in place    for urinary retention  . Foot infection   . Gout    "was in my LLE"  . Hematemesis April 2016   Hospitalized  . History of CVA (cerebrovascular accident) 08/19/2013   "minor one"  . Hyperlipidemia with target LDL less than 70   . Loss of consciousness (HCC) 08/19/2013  . 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 Urinary retention    DX NEUROGENIC BLADDER--HAS INDWELLING FOLEY CATHETER  . UTI (urinary tract infection) 10/17/2013    Past Surgical History:  Procedure Laterality Date  . ABDOMINAL AORTAGRAM N/A 03/19/2014   Procedure: ABDOMINAL Ronny Flurry;  Surgeon: Sherren Kerns, MD;  Location: [facility] CATH LAB;  Service: Cardiovascular;  Laterality: N/A;  . AMPUTATION Left 03/26/2014   Procedure: AMPUTATION SECOND LEFT TOE;  Surgeon: Sherren Kerns, MD;  Location: [facility] OR;  Service: Vascular;  Laterality: Left;  . AMPUTATION Left 05/10/2014   Procedure: AMPUTATION BELOW KNEE;  Surgeon: Larina Earthly, MD;  Location: [facility] OR;  Service: Vascular;  Laterality: Left;  . ANGIOPLASTY Left 04/29/2013  Procedure: ANGIOPLASTY;  Surgeon: Chuck Hint, MD;  Location: [facility] CATH LAB;  Service: Cardiovascular;  Laterality: Left;  . APPLICATION OF A-CELL OF EXTREMITY N/A 10/29/2014   Procedure: PLACEMENT OF APPLICATION OF A-CELL;  Surgeon: Wayland Denis, DO;  Location: [facility] OR;  Service: Plastics;  Laterality: N/A;  . AV FISTULA PLACEMENT Left 04/28/2014   Procedure: CREATION OF LEFT BRACHIOCEPHALIC  ARTERIOVENOUS (AV) FISTULA  CREATION;  Surgeon: Fransisco Hertz, MD;  Location: [facility] OR;  Service: Vascular;  Laterality: Left;  . BASCILIC VEIN TRANSPOSITION Left 10/30/2012   Procedure: LEFT 1ST STAGE BASCILIC VEIN TRANSPOSITION;  Surgeon: Chuck Hint, MD;  Location: [facility] OR;  Service: Vascular;  Laterality: Left;  . BASCILIC VEIN TRANSPOSITION Left 02/05/2013   Procedure: BASCILIC VEIN TRANSPOSITION 2ND STAGE;  Surgeon: Chuck Hint, MD;  Location: [facility] OR;  Service: Vascular;  Laterality: Left;  . BELOW KNEE LEG AMPUTATION Left   . ENDARTERECTOMY POPLITEAL Left 03/26/2014   Procedure: Left popliteal and peroneal artery endarterectomy with vein patch angioplasty;  Surgeon: Sherren Kerns, MD;  Location: [facility] OR;  Service: Vascular;  Laterality: Left;  . ESOPHAGEAL MANOMETRY N/A 11/24/2014   Procedure: ESOPHAGEAL MANOMETRY (EM);  Surgeon: Jeani Hawking, MD;  Location: [facility] ENDOSCOPY;  Service: Endoscopy;  Laterality: N/A;  . ESOPHAGOGASTRODUODENOSCOPY N/A 06/13/2014   Procedure: ESOPHAGOGASTRODUODENOSCOPY (EGD);  Surgeon: Jeani Hawking, MD;  Location: [facility] ENDOSCOPY;  Service: Endoscopy;  Laterality: N/A;  . FISTULOGRAM Left 04/29/2013   Procedure: FISTULOGRAM;  Surgeon: Chuck Hint, MD;  Location: [facility] CATH LAB;  Service: Cardiovascular;  Laterality: Left;  . FISTULOGRAM Left 10/08/2014   Procedure: LEFT ARM FISTULOGRAM;  Surgeon: Fransisco Hertz, MD;  Location: [facility] OR;  Service: Vascular;  Laterality: Left;  . INCISION AND DRAINAGE OF WOUND N/A 10/29/2014   Procedure: IRRIGATION AND DEBRIDEMENT OF SACRAL WOUND, PLACEMENT OF A-CELL;  Surgeon: Wayland Denis, DO;  Location: [facility] OR;  Service: Plastics;  Laterality: N/A;  . INSERTION OF SUPRAPUBIC CATHETER N/A 05/16/2012   Procedure: INSERTION OF SUPRAPUBIC CATHETER;  Surgeon: Sebastian Ache, MD;  Location: [facility] ORS;  Service: Urology;  Laterality: N/A;  . LEFT AND RIGHT HEART CATHETERIZATION WITH CORONARY ANGIOGRAM N/A 03/24/2014   Procedure: LEFT AND RIGHT HEART CATHETERIZATION  WITH CORONARY ANGIOGRAM;  Surgeon: Marykay Lex, MD;  Location: [facility] CATH LAB;  Service: Cardiovascular; Moderate disease in p-mLAD & Ostial OM1; normal right heart pressures. Wedge pressure 29 mmHg. --No lesion to explain inferior defect on Myoview and echocardiogram.   . NM MYOCAR PERF WALL MOTION  04/19/2012/ 02/2014   a) low risk study-EF 44%,mild inferolateral hypkinesis; b) EF 35%. Mild hypokinesis of the inferior wall with fixed inferior defect suggesting scar.  Marland Kitchen RADIOLOGY WITH ANESTHESIA N/A 10/25/2013   Procedure: RADIOLOGY WITH ANESTHESIA;  Surgeon: Medication Radiologist, MD;  Location: [facility] OR;  Service: Radiology;  Laterality: N/A;  . TRANSTHORACIC ECHOCARDIOGRAM  03/22/2014   EF 30-35%, mild LVH. Severe inferior hypokinesis. Mild MR.  Marland Kitchen UPPER GI ENDOSCOPY  03/06/2015   Procedure: UPPER GI ENDOSCOPY;  Surgeon: Karie Soda, MD;  Location: [facility] ORS;  Service: General;;  . VENOPLASTY Left 10/08/2014   Procedure: LEFT CEPHALIC VEIN VENOPLASTY;  Surgeon: Fransisco Hertz, MD;  Location: [facility] OR;  Service: Vascular;  Laterality: Left;    Social History   Social History  . Marital status: Single    Spouse name: N/A  . Number of children: N/A  . Years of education: N/A   Occupational History  . disabled  Social History Main Topics  . Smoking status: Never Smoker  . Smokeless tobacco: Never Used  . Alcohol use No     Comment: "stopped drinking in 2013 drank a couple beers/day, 2 days/wk"  . Drug use: No  . Sexual activity: No   Other Topics Concern  . Not on file   Social History Narrative   Granddaughter is contact      Resident of [facility]     No Known Allergies  Family History  Problem Relation Age of Onset  . Hyperlipidemia Mother   . Hypertension Mother   . Hodgkin's lymphoma Father   . Heart disease Sister     Prior to Admission medications   Medication Sig Start Date End Date Taking? Authorizing Provider  ascorbic acid (VITAMIN C) 500 MG tablet Take 500  mg by mouth 2 (two) times daily.    Historical Provider, MD  aspirin EC 81 MG tablet Take 81 mg by mouth daily.    Historical Provider, MD  cetirizine (ZYRTEC) 10 MG tablet Take 10 mg by mouth daily as needed for allergies.     Historical Provider, MD  fluticasone (FLONASE) 50 MCG/ACT nasal spray Place 1 spray into both nostrils 2 (two) times daily.    Historical Provider, MD  insulin aspart (NOVOLOG) 100 UNIT/ML injection Inject as per sliding scale: if 0-69 =0 units; 70+ = 7 units, subcutaneously before meals related to DM.    Historical Provider, MD  insulin glargine (LANTUS) 100 UNIT/ML injection Inject 6 Units into the skin every morning.    Historical Provider, MD  lidocaine-prilocaine (EMLA) cream Apply 1 application topically Every Tuesday,Thursday,and Saturday with dialysis.     Historical Provider, MD  Multiple Vitamins-Minerals (DECUBI-VITE) CAPS Take 2 capsules by mouth daily.    Historical Provider, MD  Nutritional Supplements (NUTRITIONAL SUPPLEMENT PO) Take by mouth. Prostat 120 cc TID Mech Soft texture    Historical Provider, MD  oxyCODONE-acetaminophen (PERCOCET/ROXICET) 5-325 MG tablet Take 1 tablet by mouth every 8 (eight) hours as needed for severe pain.    Historical Provider, MD  pantoprazole (PROTONIX) 40 MG tablet Take 1 tablet (40 mg total) by mouth daily. 05/13/14   Joseph Art, DO  pravastatin (PRAVACHOL) 20 MG tablet Take 20 mg by mouth every evening.     Historical Provider, MD  promethazine (PHENERGAN) 25 MG tablet Take 25 mg by mouth every 6 (six) hours as needed for nausea or vomiting.    Historical Provider, MD  senna (SENOKOT) 8.6 MG tablet Take 1 tablet by mouth 2 (two) times daily.    Historical Provider, MD  sevelamer carbonate (RENVELA) 800 MG tablet Take 1,600 mg by mouth 3 (three) times daily with meals.    Historical Provider, MD    Physical Exam: Vitals:   12/08/15 2000 12/08/15 2130 12/08/15 2236 12/08/15 2237  BP: 91/59 (!) 94/44  (!) 102/52  Pulse:  93 91  88  Resp: 18 18  17   Temp:    98.6 F (37 C)  TempSrc:    Oral  SpO2: 100% 100%  100%  Weight:   35.9 kg (79 lb 1.6 oz) 35.8 kg (79 lb)  Height:   5\' 3"  (1.6 m) 5\' 3"  (1.6 m)     General:  Appears calm and comfortable and is NAD, bair hugger in place Eyes:  PERRL, EOMI, normal lids, iris ENT:  grossly normal hearing, lips & tongue, mmm Neck:  no LAD, masses or thyromegaly Cardiovascular:  RRR, no  m/r/g. No LE edema.  Respiratory:  CTA bilaterally, no w/r/r. Normal respiratory effort. Abdomen:  soft, ntnd, NABS Skin:  no rash or induration seen on limited exam Musculoskeletal:  grossly normal tone BUE/BLE, good ROM, no bony abnormality, s/p L BKA Psychiatric:  grossly normal mood and affect, speech fluent and appropriate, AOx3 Neurologic:  CN 2-12 grossly intact, moves all extremities in coordinated fashion, sensation intact  Labs on Admission: I have personally reviewed following labs and imaging studies  CBC:  Recent Labs Lab 12/08/15 1752 12/09/15 0149  WBC 13.2* 7.8  NEUTROABS 12.1*  --   HGB 11.4* 9.7*  HCT 34.4* 29.5*  MCV 84.1 82.2  PLT 139* 148*   Basic Metabolic Panel:  Recent Labs Lab 12/08/15 1752  NA 135  K 3.2*  CL 98*  CO2 24  GLUCOSE 125*  BUN 25*  CREATININE 3.09*  CALCIUM 8.1*   GFR: Estimated Creatinine Clearance: 9.4 mL/min (by C-G formula based on SCr of 3.09 mg/dL (H)). Liver Function Tests:  Recent Labs Lab 12/08/15 1752  AST 24  ALT 28  ALKPHOS 123  BILITOT 0.6  PROT 7.2  ALBUMIN 2.8*    Recent Labs Lab 12/08/15 1752  LIPASE 17   No results for input(s): AMMONIA in the last 168 hours. Coagulation Profile:  Recent Labs Lab 12/08/15 2302  INR 1.21   Cardiac Enzymes: No results for input(s): CKTOTAL, CKMB, CKMBINDEX, TROPONINI in the last 168 hours. BNP (last 3 results) No results for input(s): PROBNP in the last 8760 hours. HbA1C: No results for input(s): HGBA1C in the last 72 hours. CBG:  Recent  Labs Lab 12/08/15 1702 12/08/15 1949 12/08/15 2140 12/08/15 2243 12/09/15 0145  GLUCAP 186* 124* 69 81 158*   Lipid Profile: No results for input(s): CHOL, HDL, LDLCALC, TRIG, CHOLHDL, LDLDIRECT in the last 72 hours. Thyroid Function Tests: No results for input(s): TSH, T4TOTAL, FREET4, T3FREE, THYROIDAB in the last 72 hours. Anemia Panel: No results for input(s): VITAMINB12, FOLATE, FERRITIN, TIBC, IRON, RETICCTPCT in the last 72 hours. Urine analysis:    Component Value Date/Time   COLORURINE RED (A) 12/08/2015 1746   APPEARANCEUR TURBID (A) 12/08/2015 1746   LABSPEC 1.013 12/08/2015 1746   PHURINE 8.5 (H) 12/08/2015 1746   GLUCOSEU NEGATIVE 12/08/2015 1746   HGBUR LARGE (A) 12/08/2015 1746   BILIRUBINUR SMALL (A) 12/08/2015 1746   KETONESUR NEGATIVE 12/08/2015 1746   PROTEINUR >300 (A) 12/08/2015 1746   UROBILINOGEN 0.2 06/25/2014 1436   NITRITE NEGATIVE 12/08/2015 1746   LEUKOCYTESUR LARGE (A) 12/08/2015 1746    Creatinine Clearance: Estimated Creatinine Clearance: 9.4 mL/min (by C-G formula based on SCr of 3.09 mg/dL (H)).  Sepsis Labs: @LABRCNTIP (procalcitonin:4,lacticidven:4) )No results found for this or any previous visit (from the past 240 hour(s)).   Radiological Exams on Admission: Dg Chest Port 1 View  Result Date: 12/08/2015 CLINICAL DATA:  Sepsis and hypothermia EXAM: PORTABLE CHEST 1 VIEW COMPARISON:  Chest radiograph 03/09/2015 FINDINGS: There is calcification within the aortic arch. Small bilateral pleural effusions. The right-sided effusion has increased in size. There diffusely increased pulmonary markings throughout both lungs. No focal area of consolidation. No pneumothorax. IMPRESSION: Slightly increased size of right pleural effusion. Unchanged left pleural effusion. No focal airspace consolidation. Electronically Signed   By: Deatra Robinson M.D.   On: 12/08/2015 18:04    EKG: Independently reviewed.  NSR with rate 78; LVH with no evidence of acute  ischemia  Assessment/Plan Principal Problem:   Hypoglycemia Active Problems:  Hypothermia   ESRD on hemodialysis (HCC)   S/P BKA (below knee amputation) unilateral (HCC)   DM (diabetes mellitus) type II controlled with renal manifestation (HCC)   Chronic diastolic congestive heart failure (HCC)   Sepsis (HCC)   Hypoglycemia -Patient with fairly profound and prolonged hypoglycemia -Her hypothermia, elevated lactate, and other SIRS criteria may be elevated as a result of the prolonged hypoglycemia -Patient given glucose and started on D5 drip -Will admit to Med Surg and continue to follow for this issue  Sepsis -Elevated WBC count, hypothermia with elevated lactate to 2.38 and borderline hypotension -Sepsis protocol initiated -Suspect urinary source - has indwelling foley so could also be asymptomatic bacteriuria -Blood and urine cultures pending -Will admit and continue to monitor -Based on SNF, she needs treatment for hospital-associated UTI - will treat with Maxepime as per protocol -Will trend lactate to ensure improvement  ESRD -Tu/Th/Sat HD -Will defer to day team to call for maintenance HD tomorrow, as she will not need it again until Wednesday  DM -Hold Lantus, consider discontinuation based on need for admission for hypoglycemia  S/p BKA, PVD -Stable  CHF -Appears to be compensated -Last echo was in 02/2014 and showed EF 30-35%  DVT prophylaxis: Lovenox Code Status:  Full - confirmed with patient Family Communication: None present  Disposition Plan: Back to SNF once clinically improved Consults called: None, will need nephrology assuming still inpatient on 10/19  Admission status: Admit - It is my clinical opinion that admission to INPATIENT is reasonable and necessary because this patient will require at least 2 midnights in the hospital to treat this condition based on the medical complexity of the problems presented.  Given the aforementioned information, the  predictability of an adverse outcome is felt to be significant.    Jonah BlueJennifer Takoda Janowiak MD [facility]  If 7PM-7AM, please contact night-coverage [email or web address] Password [email or web address]  12/09/2015, 2:22 AM

## 2015-12-08 NOTE — ED Notes (Signed)
This RN inquried about abx and fluids for patient with Dr. Alfonso Patten and Dr. Clayborne Dana.

## 2015-12-08 NOTE — ED Notes (Signed)
Activated @ 17:41

## 2015-12-08 NOTE — ED Notes (Signed)
Updated Dr. Ophelia Charter on pt CGB, fluids and vitals

## 2015-12-08 NOTE — ED Notes (Addendum)
Pt ate most of dinner, continues to utilize bear warmer.  No complaints at this time.

## 2015-12-08 NOTE — ED Provider Notes (Signed)
I saw and evaluated the patient, reviewed the resident's note and I agree with the findings and plan with the following exceptions.   70 yo F here with hypoglycemia. She thinks it is because she got both long and short acting insulins last night. However hypoglycemia was this morning. Improved with glucose and d50 to >300, came back down to mid 100's here. On exam, she has a LLE amputation , catheter in place. No wounds. Lungs clear. Heart normal. Hypothermic and tachypneic though.  Bare hugger started. Will eval for sepsis and myxedema. Likely needs admission.   CRITICAL CARE Performed by: Marily Memos Total critical care time: 40 minutes Critical care time was exclusive of separately billable procedures and treating other patients. Critical care was necessary to treat or prevent imminent or life-threatening deterioration. Critical care was time spent personally by me on the following activities: development of treatment plan with patient and/or surrogate as well as nursing, discussions with consultants, evaluation of patient's response to treatment, examination of patient, obtaining history from patient or surrogate, ordering and performing treatments and interventions, ordering and review of laboratory studies, ordering and review of radiographic studies, pulse oximetry and re-evaluation of patient's condition.    EKG Interpretation  Date/Time:  Tuesday December 08 2015 17:43:03 EDT Ventricular Rate:  78 PR Interval:    QRS Duration: 100 QT Interval:  458 QTC Calculation: 522 R Axis:   -38 Text Interpretation:  Age not entered, assumed to be  70 years old for purpose of ECG interpretation Sinus rhythm Left ventricular hypertrophy Anterior Q waves, possibly due to LVH Prolonged QT interval No significant change since last tracing Confirmed by [facility] MD, Sabrea Sankey [id number]) on 12/08/2015 5:53:25 PM         Marily Memos, MD 12/10/15 1529

## 2015-12-08 NOTE — ED Notes (Signed)
CBg 69. Rn notified

## 2015-12-09 DIAGNOSIS — N186 End stage renal disease: Secondary | ICD-10-CM

## 2015-12-09 DIAGNOSIS — A419 Sepsis, unspecified organism: Secondary | ICD-10-CM

## 2015-12-09 DIAGNOSIS — Z89519 Acquired absence of unspecified leg below knee: Secondary | ICD-10-CM

## 2015-12-09 DIAGNOSIS — Z992 Dependence on renal dialysis: Secondary | ICD-10-CM

## 2015-12-09 DIAGNOSIS — E162 Hypoglycemia, unspecified: Secondary | ICD-10-CM

## 2015-12-09 DIAGNOSIS — E1122 Type 2 diabetes mellitus with diabetic chronic kidney disease: Secondary | ICD-10-CM

## 2015-12-09 LAB — GLUCOSE, CAPILLARY
GLUCOSE-CAPILLARY: 158 mg/dL — AB (ref 65–99)
GLUCOSE-CAPILLARY: 234 mg/dL — AB (ref 65–99)
GLUCOSE-CAPILLARY: 232 mg/dL — AB (ref 65–99)
GLUCOSE-CAPILLARY: 62 mg/dL — AB (ref 65–99)
GLUCOSE-CAPILLARY: 82 mg/dL (ref 65–99)
GLUCOSE-CAPILLARY: 114 mg/dL — AB (ref 65–99)
Glucose-Capillary: 208 mg/dL — ABNORMAL HIGH (ref 65–99)

## 2015-12-09 LAB — URINE CULTURE

## 2015-12-09 LAB — BASIC METABOLIC PANEL
Anion gap: 12 (ref 5–15)
BUN: 31 mg/dL — AB (ref 6–20)
CALCIUM: 8.1 mg/dL — AB (ref 8.9–10.3)
CHLORIDE: 92 mmol/L — AB (ref 101–111)
CO2: 27 mmol/L (ref 22–32)
CREATININE: 3.59 mg/dL — AB (ref 0.44–1.00)
GFR, EST AFRICAN AMERICAN: 14 mL/min — AB (ref >60)
GFR, EST NON AFRICAN AMERICAN: 12 mL/min — AB (ref >60)
Glucose, Bld: 141 mg/dL — ABNORMAL HIGH (ref 65–99)
Potassium: 4.4 mmol/L (ref 3.5–5.1)
SODIUM: 131 mmol/L — AB (ref 135–145)

## 2015-12-09 LAB — CBC
HCT: 29.5 % — ABNORMAL LOW (ref 36.0–46.0)
Hemoglobin: 9.7 g/dL — ABNORMAL LOW (ref 12.0–15.0)
MCH: 27 pg (ref 26.0–34.0)
MCHC: 32.9 g/dL (ref 30.0–36.0)
MCV: 82.2 fL (ref 78.0–100.0)
PLATELETS: 148 10*3/uL — AB (ref 150–400)
RBC: 3.59 MIL/uL — AB (ref 3.87–5.11)
RDW: 16.4 % — AB (ref 11.5–15.5)
WBC: 7.8 10*3/uL (ref 4.0–10.5)

## 2015-12-09 LAB — MRSA PCR SCREENING: MRSA BY PCR: NEGATIVE

## 2015-12-09 LAB — LACTIC ACID, PLASMA: LACTIC ACID, VENOUS: 1.3 mmol/L (ref 0.5–1.9)

## 2015-12-09 MED ORDER — DOXERCALCIFEROL 4 MCG/2ML IV SOLN
4.0000 ug | INTRAVENOUS | Status: DC
  Administered 2015-12-10 – 2015-12-12 (×2): 4 ug via INTRAVENOUS
  Filled 2015-12-09 (×2): qty 2

## 2015-12-09 MED ORDER — DARBEPOETIN ALFA 60 MCG/0.3ML IJ SOSY
60.0000 ug | PREFILLED_SYRINGE | INTRAMUSCULAR | Status: DC
  Administered 2015-12-10: 60 ug via INTRAVENOUS
  Filled 2015-12-09: qty 0.3

## 2015-12-09 MED ORDER — NEPRO/CARBSTEADY PO LIQD
237.0000 mL | Freq: Two times a day (BID) | ORAL | Status: DC
  Administered 2015-12-09 – 2015-12-14 (×7): 237 mL via ORAL

## 2015-12-09 NOTE — Plan of Care (Signed)
Problem: Nutrition: Goal: Adequate nutrition will be maintained Outcome: Not Progressing Patient not eating snack or meal offered.

## 2015-12-09 NOTE — Consult Note (Signed)
[name] Renal Consultation Note    Indication for Consultation:  Management of ESRD/hemodialysis, anemia, hypertension/volume, and secondary hyperparathyroidism. PCP:  HPI: Elizabeth [name] is a 70 y.o. female with ESRD, DM, Hx CVA, PVD, Hx neurogenic bladder (indwelling foley) who was admitted with hypoglycemia. Had her usual HD yesterday, afterwards felt dizzy and weak. Glucose was found to be 33; EMS called. Denies CP or dyspnea. Denies dysuria. + N/V. Previously had long-standing diarrhea, she reports that this has improved.  In ED, glucagon and D10 infusion started. Labs fine (post-HD K 3.2) except urine suggestive of UTI and WBC 13. CXR with B pleural effusions (R>L). Pro-calcitonin = 7, lactic acid 2.38. She was started on abx and admitted for further evaluation.  From renal standpoint, she is TTS dialysis patient. Last HD 10/17 which she completed in entirety. Volume on CXR, but breathing comfortably and labs ok.  Past Medical History:  Diagnosis Date  . Acute hyperkalemia 12/24/2013  . Anemia   . Arthritis of knee, right    "right knee" (06/12/2014)  . Coronary artery disease, non-occlusive January 2016   Moderate p&mLAD & Ostial OM; Myoview Nuclear Stress Test 03/21/2014: EF 35%. Mild hypokinesis of the inferior wall with fixed inferior defect suggesting scar. (no culprit lesion on CATH)  . Critical lower limb ischemia    Status post left BKA following left popliteal and peroneal artery endarterectomy with patch angioplasty.  . Diabetes mellitus, insulin dependent (IDDM), uncontrolled (HCC)    Brittle; has h/o Hypo & Hyperglycemic episodes, Type II  . Diabetic neuropathy (HCC)   . ESRD (end stage renal disease) (HCC)    [address], [place], [place] (06/12/2014)  . Foley catheter in place    for urinary retention  . Foot infection   . Gout    "was in my LLE"  . Hematemesis April 2016   Hospitalized  . History of CVA (cerebrovascular accident) 08/19/2013   "minor  one"  . Hyperlipidemia with target LDL less than 70   . Loss of consciousness (HCC) 08/19/2013  . 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 Urinary retention    DX NEUROGENIC BLADDER--HAS INDWELLING FOLEY CATHETER  . UTI (urinary tract infection) 10/17/2013   Past Surgical History:  Procedure Laterality Date  . ABDOMINAL AORTAGRAM N/A 03/19/2014   Procedure: ABDOMINAL Ronny Flurry;  Surgeon: Sherren Kerns, MD;  Location: [facility] CATH LAB;  Service: Cardiovascular;  Laterality: N/A;  . AMPUTATION Left 03/26/2014   Procedure: AMPUTATION SECOND LEFT TOE;  Surgeon: Sherren Kerns, MD;  Location: [facility] OR;  Service: Vascular;  Laterality: Left;  . AMPUTATION Left 05/10/2014   Procedure: AMPUTATION BELOW KNEE;  Surgeon: Larina Earthly, MD;  Location: [facility] OR;  Service: Vascular;  Laterality: Left;  . ANGIOPLASTY Left 04/29/2013   Procedure: ANGIOPLASTY;  Surgeon: Chuck Hint, MD;  Location: [facility] CATH LAB;  Service: Cardiovascular;  Laterality: Left;  . APPLICATION OF A-CELL OF EXTREMITY N/A 10/29/2014   Procedure: PLACEMENT OF APPLICATION OF A-CELL;  Surgeon: Wayland Denis, DO;  Location: [facility] OR;  Service: Plastics;  Laterality: N/A;  . AV FISTULA PLACEMENT Left 04/28/2014   Procedure: CREATION OF LEFT BRACHIOCEPHALIC  ARTERIOVENOUS (AV) FISTULA CREATION;  Surgeon: Fransisco Hertz, MD;  Location: [facility] OR;  Service: Vascular;  Laterality: Left;  . BASCILIC VEIN TRANSPOSITION Left 10/30/2012   Procedure:  LEFT 1ST STAGE BASCILIC VEIN TRANSPOSITION;  Surgeon: Chuck Hint, MD;  Location: [facility] OR;  Service: Vascular;  Laterality: Left;  . BASCILIC VEIN TRANSPOSITION Left 02/05/2013   Procedure: BASCILIC VEIN TRANSPOSITION 2ND STAGE;  Surgeon: Chuck Hint, MD;  Location: [facility] OR;  Service: Vascular;  Laterality: Left;  . BELOW  KNEE LEG AMPUTATION Left   . ENDARTERECTOMY POPLITEAL Left 03/26/2014   Procedure: Left popliteal and peroneal artery endarterectomy with vein patch angioplasty;  Surgeon: Sherren Kerns, MD;  Location: [facility] OR;  Service: Vascular;  Laterality: Left;  . ESOPHAGEAL MANOMETRY N/A 11/24/2014   Procedure: ESOPHAGEAL MANOMETRY (EM);  Surgeon: Jeani Hawking, MD;  Location: [facility] ENDOSCOPY;  Service: Endoscopy;  Laterality: N/A;  . ESOPHAGOGASTRODUODENOSCOPY N/A 06/13/2014   Procedure: ESOPHAGOGASTRODUODENOSCOPY (EGD);  Surgeon: Jeani Hawking, MD;  Location: [facility] ENDOSCOPY;  Service: Endoscopy;  Laterality: N/A;  . FISTULOGRAM Left 04/29/2013   Procedure: FISTULOGRAM;  Surgeon: Chuck Hint, MD;  Location: [facility] CATH LAB;  Service: Cardiovascular;  Laterality: Left;  . FISTULOGRAM Left 10/08/2014   Procedure: LEFT ARM FISTULOGRAM;  Surgeon: Fransisco Hertz, MD;  Location: [facility] OR;  Service: Vascular;  Laterality: Left;  . INCISION AND DRAINAGE OF WOUND N/A 10/29/2014   Procedure: IRRIGATION AND DEBRIDEMENT OF SACRAL WOUND, PLACEMENT OF A-CELL;  Surgeon: Wayland Denis, DO;  Location: [facility] OR;  Service: Plastics;  Laterality: N/A;  . INSERTION OF SUPRAPUBIC CATHETER N/A 05/16/2012   Procedure: INSERTION OF SUPRAPUBIC CATHETER;  Surgeon: Sebastian Ache, MD;  Location: [facility] ORS;  Service: Urology;  Laterality: N/A;  . LEFT AND RIGHT HEART CATHETERIZATION WITH CORONARY ANGIOGRAM N/A 03/24/2014   Procedure: LEFT AND RIGHT HEART CATHETERIZATION WITH CORONARY ANGIOGRAM;  Surgeon: Marykay Lex, MD;  Location: [facility] CATH LAB;  Service: Cardiovascular; Moderate disease in p-mLAD & Ostial OM1; normal right heart pressures. Wedge pressure 29 mmHg. --No lesion to explain inferior defect on Myoview and echocardiogram.   . NM MYOCAR PERF WALL MOTION  04/19/2012/ 02/2014   a) low risk study-EF 44%,mild inferolateral hypkinesis; b) EF 35%. Mild hypokinesis of the inferior wall with fixed inferior defect suggesting scar.  [facility] RADIOLOGY WITH  ANESTHESIA N/A 10/25/2013   Procedure: RADIOLOGY WITH ANESTHESIA;  Surgeon: Medication Radiologist, MD;  Location: [facility] OR;  Service: Radiology;  Laterality: N/A;  . TRANSTHORACIC ECHOCARDIOGRAM  03/22/2014   EF 30-35%, mild LVH. Severe inferior hypokinesis. Mild MR.  Elizabeth [facility] UPPER GI ENDOSCOPY  03/06/2015   Procedure: UPPER GI ENDOSCOPY;  Surgeon: Karie Soda, MD;  Location: [facility] ORS;  Service: General;;  . VENOPLASTY Left 10/08/2014   Procedure: LEFT CEPHALIC VEIN VENOPLASTY;  Surgeon: Fransisco Hertz, MD;  Location: [facility] OR;  Service: Vascular;  Laterality: Left;   Family History  Problem Relation Age of Onset  . Hyperlipidemia Mother   . Hypertension Mother   . Hodgkin's lymphoma Father   . Heart disease Sister    Social History:  reports that she has never smoked. She has never used smokeless tobacco. She reports that she does not drink alcohol or use drugs.  ROS: As per HPI otherwise negative.  Physical Exam: Vitals:   12/08/15 2130 12/08/15 2236 12/08/15 2237 12/09/15 0511  BP: (!) 94/44  (!) 102/52 (!) 108/58  Pulse: 91  88 95  Resp: 18  17 18   Temp:   98.6 F (37 C) 98.9 F (37.2 C)  TempSrc:   Oral Oral  SpO2: 100%  100% 94%  Weight:  35.9 kg (79 lb 1.6 oz) 35.8  kg (79 lb)   Height:  5\' 3"  (1.6 m) 5\' 3"  (1.6 m)      General: Frail, elderly female, in no acute distress. Head: Normocephalic, atraumatic, sclera non-icteric, mucus membranes are moist. Neck: Supple without lymphadenopathy/masses. JVD not elevated. Lungs: Clear bilaterally in upper lobes, ?dull in bases. Poor inspiratory effort. Breathing is unlabored. Heart: RRR; no murmur. Abdomen: Soft, non-tender, non-distended with normoactive bowel sounds. Musculoskeletal:  Strength and tone appear normal for age. Lower extremities: No LE edema, L BKA. Neuro: Alert and oriented X 3. Moves all extremities spontaneously. Psych:  Responds to questions appropriately with a normal affect. Dialysis Access: AVF + thrill  No Known  Allergies Prior to Admission medications   Medication Sig Start Date End Date Taking? Authorizing Provider  ascorbic acid (VITAMIN C) 500 MG tablet Take 500 mg by mouth 2 (two) times daily.    Historical Provider, MD  aspirin EC 81 MG tablet Take 81 mg by mouth daily.    Historical Provider, MD  cetirizine (ZYRTEC) 10 MG tablet Take 10 mg by mouth daily as needed for allergies.     Historical Provider, MD  fluticasone (FLONASE) 50 MCG/ACT nasal spray Place 1 spray into both nostrils 2 (two) times daily.    Historical Provider, MD  insulin aspart (NOVOLOG) 100 UNIT/ML injection Inject as per sliding scale: if 0-69 =0 units; 70+ = 7 units, subcutaneously before meals related to DM.    Historical Provider, MD  insulin glargine (LANTUS) 100 UNIT/ML injection Inject 6 Units into the skin every morning.    Historical Provider, MD  lidocaine-prilocaine (EMLA) cream Apply 1 application topically Every Tuesday,Thursday,and Saturday with dialysis.     Historical Provider, MD  Multiple Vitamins-Minerals (DECUBI-VITE) CAPS Take 2 capsules by mouth daily.    Historical Provider, MD  Nutritional Supplements (NUTRITIONAL SUPPLEMENT PO) Take by mouth. Prostat 120 cc TID Mech Soft texture    Historical Provider, MD  oxyCODONE-acetaminophen (PERCOCET/ROXICET) 5-325 MG tablet Take 1 tablet by mouth every 8 (eight) hours as needed for severe pain.    Historical Provider, MD  pantoprazole (PROTONIX) 40 MG tablet Take 1 tablet (40 mg total) by mouth daily. 05/13/14   Joseph Art, DO  pravastatin (PRAVACHOL) 20 MG tablet Take 20 mg by mouth every evening.     Historical Provider, MD  promethazine (PHENERGAN) 25 MG tablet Take 25 mg by mouth every 6 (six) hours as needed for nausea or vomiting.    Historical Provider, MD  senna (SENOKOT) 8.6 MG tablet Take 1 tablet by mouth 2 (two) times daily.    Historical Provider, MD  sevelamer carbonate (RENVELA) 800 MG tablet Take 1,600 mg by mouth 3 (three) times daily with  meals.    Historical Provider, MD   Current Facility-Administered Medications  Medication Dose Route Frequency Provider Last Rate Last Dose  . acetaminophen (TYLENOL) tablet 650 mg  650 mg Oral Q6H PRN Jonah Blue, MD       Or  . acetaminophen (TYLENOL) suppository 650 mg  650 mg Rectal Q6H PRN Jonah Blue, MD      . aspirin EC tablet 81 mg  81 mg Oral Daily Jonah Blue, MD      . calcium carbonate (dosed in mg elemental calcium) suspension 500 mg of elemental calcium  500 mg of elemental calcium Oral Q6H PRN Jonah Blue, MD      . camphor-menthol [facility]) lotion 1 application  1 application Topical Q8H PRN Jonah Blue, MD  And  . hydrOXYzine (ATARAX/VISTARIL) tablet 25 mg  25 mg Oral Q8H PRN Jonah Blue, MD      . ceFEPIme (MAXIPIME) 1 g in dextrose 5 % 50 mL IVPB  1 g Intravenous Daily Jonah Blue, MD   1 g at 12/09/15 0615  . docusate sodium (COLACE) capsule 100 mg  100 mg Oral BID Jonah Blue, MD      . docusate sodium [facility]) enema 283 mg  1 enema Rectal PRN Jonah Blue, MD      . enoxaparin (LOVENOX) injection 20 mg  20 mg Subcutaneous QHS Jonah Blue, MD      . feeding supplement (NEPRO CARB STEADY) liquid 237 mL  237 mL Oral TID PRN Jonah Blue, MD      . fluticasone (FLONASE) 50 MCG/ACT nasal spray 1 spray  1 spray Each Nare BID Jonah Blue, MD      . insulin aspart (novoLOG) injection 0-9 Units  0-9 Units Subcutaneous TID WC Jonah Blue, MD      . Melene Muller ON 12/10/2015] lidocaine-prilocaine (EMLA) cream 1 application  1 application Topical Q T,Th,Sa-HD Jonah Blue, MD      . multivitamin (RENA-VIT) tablet 1 tablet  1 tablet Oral QHS Jonah Blue, MD      . ondansetron [facility]) tablet 4 mg  4 mg Oral Q6H PRN Jonah Blue, MD       Or  . ondansetron [facility]) injection 4 mg  4 mg Intravenous Q6H PRN Jonah Blue, MD   4 mg at 12/09/15 0110  . oxyCODONE-acetaminophen (PERCOCET/ROXICET) 5-325 MG per tablet 1 tablet  1 tablet Oral Q8H  PRN Jonah Blue, MD      . pantoprazole (PROTONIX) EC tablet 40 mg  40 mg Oral Daily Jonah Blue, MD      . pravastatin (PRAVACHOL) tablet 20 mg  20 mg Oral QPM Jonah Blue, MD      . promethazine (PHENERGAN) tablet 25 mg  25 mg Oral Q6H PRN Jonah Blue, MD      . senna [facility]) tablet 8.6 mg  1 tablet Oral BID Jonah Blue, MD      . sevelamer carbonate (RENVELA) tablet 1,600 mg  1,600 mg Oral TID WC Jonah Blue, MD      . sorbitol 70 % solution 30 mL  30 mL Oral PRN Jonah Blue, MD      . vitamin C (ASCORBIC ACID) tablet 500 mg  500 mg Oral BID Jonah Blue, MD       Labs: Basic Metabolic Panel:  Recent Labs Lab 12/08/15 1752 12/09/15 0149  NA 135 131*  K 3.2* 4.4  CL 98* 92*  CO2 24 27  GLUCOSE 125* 141*  BUN 25* 31*  CREATININE 3.09* 3.59*  CALCIUM 8.1* 8.1*   Liver Function Tests:  Recent Labs Lab 12/08/15 1752  AST 24  ALT 28  ALKPHOS 123  BILITOT 0.6  PROT 7.2  ALBUMIN 2.8*    Recent Labs Lab 12/08/15 1752  LIPASE 17   CBC:  Recent Labs Lab 12/08/15 1752 12/09/15 0149  WBC 13.2* 7.8  NEUTROABS 12.1*  --   HGB 11.4* 9.7*  HCT 34.4* 29.5*  MCV 84.1 82.2  PLT 139* 148*   CBG:  Recent Labs Lab 12/08/15 2140 12/08/15 2243 12/09/15 0145 12/09/15 0519 12/09/15 0740  GLUCAP 69 81 158* 208* 234*   Studies/Results: Dg Chest Port 1 View  Result Date: 12/08/2015 CLINICAL DATA:  Sepsis and hypothermia EXAM: PORTABLE CHEST 1 VIEW COMPARISON:  Chest  radiograph 03/09/2015 FINDINGS: There is calcification within the aortic arch. Small bilateral pleural effusions. The right-sided effusion has increased in size. There diffusely increased pulmonary markings throughout both lungs. No focal area of consolidation. No pneumothorax. IMPRESSION: Slightly increased size of right pleural effusion. Unchanged left pleural effusion. No focal airspace consolidation. Electronically Signed   By: Deatra RobinsonKevin  Herman M.D.   On: 12/08/2015 18:04   Dialysis  Orders[facility]  [facility]. 3:30 hours, EDW 35kg, AVF, BFR 350/DFR 500, 2K/2.25Ca bath - Heparin 1500 units IV bolus q HD - Mircera 75mcg q 4 weeks - Hectoral 4mcg IV q HD  Assessment/Plan: 1.  Hypoglycemia: Improving s/p D10, then D5W infusion. Per primary. 2.  Possible UTI: Indwelling foley. On Cefepime. BCx and UCx 10/17 pending. 3.  ESRD:  Continue TTS schedule, next HD 10/19. 4.  Hypertension/volume: BP controlled. CXR with pleural effusions. Will attempt increased UF with HD. 5.  Anemia: Hgb 9.7. Start Aranesp 60mcg with next HD. 6.  Metabolic bone disease: Ca 8.1. Continue Renvela with meals and will resume VDRA. Check Phos tomorrow. 7.  Nutrition: Alb 2.8; will start Nepro supplements.  Ozzie HoyleKatie Stovall, PA-C 12/09/2015, 10:21 AM  [facility] Pager: [phone]  Pt seen, examined and agree w A/P as above.  Vinson Moselleob Keliah Harned MD [facility] pager [phone]   12/09/2015, 4:58 PM

## 2015-12-09 NOTE — Progress Notes (Signed)
PROGRESS NOTE        PATIENT DETAILS Name: Elizabeth [name] Age: 70 y.o. Sex: female Date of Birth: 09/16/1945 Admit Date: 12/08/2015 Admitting Physician Jonah BlueJennifer Yates, MD ZOX:[id number]:Carter, Maxine GlennMonica, DO  Brief Narrative: Patient is a 70 y.o. female resident of a skilled nursing facility, with history of ESRD on hemodialysis, diabetes, left BKA, chronic indwelling Foley catheter admitted for evaluation and treatment of persistent hypoglycemia.  Subjective: Slightly nauseous this morning. But CBGs are much better. Claims Foley catheter has not been changed for more than a month-room smells of urine.  No chest pain No shortness of breath   Assessment/Plan: Principal Problem:  Hypoglycemia: Resolved, stop D5 infusion. Await A1c, may need adjustment of her home insulin regimen. May need to allow mild permissive hyperglycemia, in order to prevent severe hypoglycemia.  Active Problems: Sepsis likely secondary to catheter related UTI: Mild sepsis pathophysiology evident on admission, this has since resolved. Unfortunately, apparently foley catheter has not been changed over 1 month. Foul-smelling urine odor when I entered the room this morning. I have asked RN to change Foley catheter, continue empiric antibiotics and await culture data.   History of insulin-dependent diabetes: Admitted with hypoglycemia-apparently on both Lantus and SSI prior to this admission-await A1c-we will need adjustment of her insulin regimen prior to discharge. See above.  ESRD: Nephrology consulted for hemodialysis.  Chronic systolic heart failure (EF 30-35% by TTE on January 2016): Clinically compensated, volume removal with hemodialysis. 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 Reason-? Due to stage  IV sacral decubitus ulcer-she still makes urine-have asked RN to change Foley catheter. Defer to patient's PCP whether or not to continue it long-term  Anemia: Suspect secondary to chronic kidney disease- Hemoglobin stable-darbepoetin being dosed by nephrology  History of peripheral vascular disease-status post BKA  DVT Prophylaxis: Prophylactic Lovenox   Code Status: Full code   Family Communication: None at bedside  Disposition Plan: Remain inpatient-SNF on discharge  Antimicrobial agents: See below  Procedures: None  CONSULTS:  nephrology  Time spent: 25 minutes-Greater than 50% of this time was spent in counseling, explanation of diagnosis, planning of further management, and coordination of care.  MEDICATIONS: Anti-infectives    Start     Dose/Rate Route Frequency Ordered Stop   12/09/15 0600  ceFEPIme (MAXIPIME) 1 g in dextrose 5 % 50 mL IVPB     1 g 100 mL/hr over 30 Minutes Intravenous Daily 12/08/15 2257     12/08/15 2245  ceFEPIme (MAXIPIME) 2 g in dextrose 5 % 50 mL IVPB  Status:  Discontinued     2 g 100 mL/hr over 30 Minutes Intravenous  Once 12/08/15 2240 12/08/15 2251   12/08/15 1900  cefTRIAXone (ROCEPHIN) 1 g in dextrose 5 % 50 mL IVPB     1 g 100 mL/hr over 30 Minutes Intravenous  Once 12/08/15 1858 12/08/15 2143      Scheduled Meds: . aspirin EC  81 mg Oral Daily  . ceFEPime (MAXIPIME) IV  1 g Intravenous Daily  . [START ON 12/10/2015] darbepoetin (ARANESP) injection - DIALYSIS  60 mcg Intravenous Q Thu-HD  . docusate sodium  100 mg Oral BID  . [START ON 12/10/2015] doxercalciferol  4  mcg Intravenous Q T,Th,Sa-HD  . enoxaparin (LOVENOX) injection  20 mg Subcutaneous QHS  . feeding supplement (NEPRO CARB STEADY)  237 mL Oral BID BM  . fluticasone  1 spray Each Nare BID  . insulin aspart  0-9 Units Subcutaneous TID WC  . [START ON 12/10/2015] lidocaine-prilocaine  1 application Topical Q T,Th,Sa-HD  . multivitamin  1 tablet Oral QHS  .  pantoprazole  40 mg Oral Daily  . pravastatin  20 mg Oral QPM  . senna  1 tablet Oral BID  . sevelamer carbonate  1,600 mg Oral TID WC  . ascorbic acid  500 mg Oral BID   Continuous Infusions:  PRN Meds:.acetaminophen **OR** acetaminophen, calcium carbonate (dosed in mg elemental calcium), camphor-menthol **AND** hydrOXYzine, docusate sodium, feeding supplement (NEPRO CARB STEADY), ondansetron **OR** ondansetron (ZOFRAN) IV, oxyCODONE-acetaminophen, promethazine, sorbitol   PHYSICAL EXAM: Vital signs: Vitals:   12/08/15 2236 12/08/15 2237 12/09/15 0511 12/09/15 1000  BP:  (!) 102/52 (!) 108/58 (!) 104/57  Pulse:  88 95 100  Resp:  17 18 18   Temp:  98.6 F (37 C) 98.9 F (37.2 C) 98.7 F (37.1 C)  TempSrc:  Oral Oral Oral  SpO2:  100% 94% 98%  Weight: 35.9 kg (79 lb 1.6 oz) 35.8 kg (79 lb)    Height: 5\' 3"  (1.6 m) 5\' 3"  (1.6 m)     Filed Weights   12/08/15 2236 12/08/15 2237  Weight: 35.9 kg (79 lb 1.6 oz) 35.8 kg (79 lb)   Body mass index is 13.99 kg/m.   General appearance :Awake, alert, not in any distress. Speech Clear. Chronically sick appearing Eyes:, pupils equally reactive to light and accomodation,no scleral icterus.Pink conjunctiva HEENT: Atraumatic and Normocephalic Neck: supple, no JVD. No cervical lymphadenopathy. No thyromegaly Resp:Good air entry bilaterally, no added sounds  CVS: S1 S2 regular, no murmurs.  GI: Bowel sounds present, Non tender and not distended with no gaurding, rigidity or rebound.No organomegaly Extremities: Left BKA-stump without any acute issues Neurology:  speech clear,Non focal Psychiatric: Normal judgment and insight. Alert and oriented x 3. Normal mood. Musculoskeletal:No digital cyanosis Skin:No Rash, warm and dry Wounds: Stage IV sacral ulcer  I have personally reviewed following labs and imaging studies  LABORATORY DATA: CBC:  Recent Labs Lab 12/08/15 1752 12/09/15 0149  WBC 13.2* 7.8  NEUTROABS 12.1*  --   HGB 11.4*  9.7*  HCT 34.4* 29.5*  MCV 84.1 82.2  PLT 139* 148*    Basic Metabolic Panel:  Recent Labs Lab 12/08/15 1752 12/09/15 0149  NA 135 131*  K 3.2* 4.4  CL 98* 92*  CO2 24 27  GLUCOSE 125* 141*  BUN 25* 31*  CREATININE 3.09* 3.59*  CALCIUM 8.1* 8.1*    GFR: Estimated Creatinine Clearance: 8.1 mL/min (by C-G formula based on SCr of 3.59 mg/dL (H)).  Liver Function Tests:  Recent Labs Lab 12/08/15 1752  AST 24  ALT 28  ALKPHOS 123  BILITOT 0.6  PROT 7.2  ALBUMIN 2.8*    Recent Labs Lab 12/08/15 1752  LIPASE 17   No results for input(s): AMMONIA in the last 168 hours.  Coagulation Profile:  Recent Labs Lab 12/08/15 2302  INR 1.21    Cardiac Enzymes: No results for input(s): CKTOTAL, CKMB, CKMBINDEX, TROPONINI in the last 168 hours.  BNP (last 3 results) No results for input(s): PROBNP in the last 8760 hours.  HbA1C: No results for input(s): HGBA1C in the last 72 hours.  CBG:  Recent Labs Lab  12/08/15 2243 12/09/15 0145 12/09/15 0519 12/09/15 0740 12/09/15 1158  GLUCAP 81 158* 208* 234* 232*    Lipid Profile: No results for input(s): CHOL, HDL, LDLCALC, TRIG, CHOLHDL, LDLDIRECT in the last 72 hours.  Thyroid Function Tests: No results for input(s): TSH, T4TOTAL, FREET4, T3FREE, THYROIDAB in the last 72 hours.  Anemia Panel: No results for input(s): VITAMINB12, FOLATE, FERRITIN, TIBC, IRON, RETICCTPCT in the last 72 hours.  Urine analysis:    Component Value Date/Time   COLORURINE RED (A) 12/08/2015 1746   APPEARANCEUR TURBID (A) 12/08/2015 1746   LABSPEC 1.013 12/08/2015 1746   PHURINE 8.5 (H) 12/08/2015 1746   GLUCOSEU NEGATIVE 12/08/2015 1746   HGBUR LARGE (A) 12/08/2015 1746   BILIRUBINUR SMALL (A) 12/08/2015 1746   KETONESUR NEGATIVE 12/08/2015 1746   PROTEINUR >300 (A) 12/08/2015 1746   UROBILINOGEN 0.2 06/25/2014 1436   NITRITE NEGATIVE 12/08/2015 1746   LEUKOCYTESUR LARGE (A) 12/08/2015 1746    Sepsis Labs: Lactic  Acid, Venous    Component Value Date/Time   LATICACIDVEN 1.3 12/09/2015 0149    MICROBIOLOGY: Recent Results (from the past 240 hour(s))  Urine culture     Status: Abnormal   Collection Time: 12/08/15  5:46 PM  Result Value Ref Range Status   Specimen Description URINE, RANDOM  Final   Special Requests NONE  Final   Culture MULTIPLE SPECIES PRESENT, SUGGEST RECOLLECTION (A)  Final   Report Status 12/09/2015 FINAL  Final  Blood Culture (routine x 2)     Status: None (Preliminary result)   Collection Time: 12/08/15  7:20 PM  Result Value Ref Range Status   Specimen Description BLOOD RIGHT ARM  Final   Special Requests BOTTLES DRAWN AEROBIC AND ANAEROBIC 5CC  Final   Culture NO GROWTH < 24 HOURS  Final   Report Status PENDING  Incomplete  Blood Culture (routine x 2)     Status: None (Preliminary result)   Collection Time: 12/08/15  7:20 PM  Result Value Ref Range Status   Specimen Description BLOOD RIGHT HAND  Final   Special Requests BOTTLES DRAWN AEROBIC AND ANAEROBIC 5CC  Final   Culture NO GROWTH < 24 HOURS  Final   Report Status PENDING  Incomplete  MRSA PCR Screening     Status: None   Collection Time: 12/08/15 11:56 PM  Result Value Ref Range Status   MRSA by PCR NEGATIVE NEGATIVE Final    Comment:        The GeneXpert MRSA Assay (FDA approved for NASAL specimens only), is one component of a comprehensive MRSA colonization surveillance program. It is not intended to diagnose MRSA infection nor to guide or monitor treatment for MRSA infections.     RADIOLOGY STUDIES/RESULTS: Dg Chest Port 1 View  Result Date: 12/08/2015 CLINICAL DATA:  Sepsis and hypothermia EXAM: PORTABLE CHEST 1 VIEW COMPARISON:  Chest radiograph 03/09/2015 FINDINGS: There is calcification within the aortic arch. Small bilateral pleural effusions. The right-sided effusion has increased in size. There diffusely increased pulmonary markings throughout both lungs. No focal area of consolidation. No  pneumothorax. IMPRESSION: Slightly increased size of right pleural effusion. Unchanged left pleural effusion. No focal airspace consolidation. Electronically Signed   By: Deatra Robinson M.D.   On: 12/08/2015 18:04     LOS: 1 day   Jeoffrey Massed, MD  [facility] Pager:[phone]  If 7PM-7AM, please contact night-coverage [email or web address] Password [email or web address] 12/09/2015, 4:07 PM

## 2015-12-09 NOTE — Plan of Care (Signed)
Problem: Skin Integrity: Goal: Risk for impaired skin integrity will decrease Outcome: Not Progressing Admitted with stage IV sacral ulcer and necrotic toes (3rd and 4th)  right foot

## 2015-12-09 NOTE — Consult Note (Addendum)
WOC Nurse wound consult note Reason for Consult: Chronic Stage 4 Pressure Injury/ Sacrum Wound type:Chronic Stage 4 Pressure Injury Pressure Ulcer POA: Yes Measurement:1.1x1.6x0.9 Wound bed:Adherent [place], yellow Drainage (amount, consistency, odor) small serosangenous Periwound:Intact Dressing procedure/placement/frequency:Applied Calcium alginate to wound bed, cover with foam border, change 3 times a week or prn soiled.  Amparo Bristol WOC student Please re-consult if further assistance is needed.  Thank-you,  Cammie Mcgee MSN, RN, CWOCN, [place], CNS [phone]

## 2015-12-10 DIAGNOSIS — I5032 Chronic diastolic (congestive) heart failure: Secondary | ICD-10-CM

## 2015-12-10 LAB — GLUCOSE, CAPILLARY
GLUCOSE-CAPILLARY: 70 mg/dL (ref 65–99)
GLUCOSE-CAPILLARY: 123 mg/dL — AB (ref 65–99)
Glucose-Capillary: 102 mg/dL — ABNORMAL HIGH (ref 65–99)
Glucose-Capillary: 162 mg/dL — ABNORMAL HIGH (ref 65–99)

## 2015-12-10 LAB — CBC
HCT: 29.3 % — ABNORMAL LOW (ref 36.0–46.0)
Hemoglobin: 9.6 g/dL — ABNORMAL LOW (ref 12.0–15.0)
MCH: 27.3 pg (ref 26.0–34.0)
MCHC: 32.8 g/dL (ref 30.0–36.0)
MCV: 83.2 fL (ref 78.0–100.0)
PLATELETS: 189 10*3/uL (ref 150–400)
RBC: 3.52 MIL/uL — ABNORMAL LOW (ref 3.87–5.11)
RDW: 16.6 % — AB (ref 11.5–15.5)
WBC: 8.9 10*3/uL (ref 4.0–10.5)

## 2015-12-10 LAB — RENAL FUNCTION PANEL
Albumin: 2.5 g/dL — ABNORMAL LOW (ref 3.5–5.0)
Anion gap: 13 (ref 5–15)
BUN: 42 mg/dL — AB (ref 6–20)
CHLORIDE: 92 mmol/L — AB (ref 101–111)
CO2: 24 mmol/L (ref 22–32)
CREATININE: 4.94 mg/dL — AB (ref 0.44–1.00)
Calcium: 9 mg/dL (ref 8.9–10.3)
GFR calc Af Amer: 9 mL/min — ABNORMAL LOW (ref >60)
GFR calc non Af Amer: 8 mL/min — ABNORMAL LOW (ref >60)
GLUCOSE: 172 mg/dL — AB (ref 65–99)
Phosphorus: 6.7 mg/dL — ABNORMAL HIGH (ref 2.5–4.6)
Potassium: 4.8 mmol/L (ref 3.5–5.1)
Sodium: 129 mmol/L — ABNORMAL LOW (ref 135–145)

## 2015-12-10 LAB — HEMOGLOBIN A1C
HEMOGLOBIN A1C: 8.8 % — AB (ref 4.8–5.6)
MEAN PLASMA GLUCOSE: 206 mg/dL

## 2015-12-10 MED ORDER — DOXERCALCIFEROL 4 MCG/2ML IV SOLN
INTRAVENOUS | Status: AC
  Filled 2015-12-10: qty 2

## 2015-12-10 MED ORDER — DARBEPOETIN ALFA 60 MCG/0.3ML IJ SOSY
PREFILLED_SYRINGE | INTRAMUSCULAR | Status: AC
  Administered 2015-12-10: 60 ug via INTRAVENOUS
  Filled 2015-12-10: qty 0.3

## 2015-12-10 NOTE — Care Management Note (Signed)
Case Management Note  Patient Details  Name: Elizabeth [name] MRN: [id number] Date of Birth: 03-22-1945  Subjective/Objective:       CM following for progression and d/c planning.              Action/Plan: 12/10/2015 Spoke with pt POA , [name] , who states that the family would like the pt to be d/c to home with hospice services. This was also discussed with [name]'s aunt, [name], who will take this pt into her home with hospice services. [facility] referral made and request meeting with family on 12/11/2015 afternoon to plan for d/c. The family will discuss stopping HD with the pt prior to meeting with hospice.   Expected Discharge Date:      12/11/2015            Expected Discharge Plan:  Home w Hospice Care  In-House Referral:  NA  Discharge planning Services  CM Consult  Post Acute Care Choice:  Durable Medical Equipment, Hospice Choice offered to:     DME Arranged:    DME Agency:   [facility]  HH Arranged:   RN, SW and aide. HH Agency:   [facility].  Status of Service:  In process, will continue to follow  If discussed at Long Length of Stay Meetings, dates discussed:    Additional Comments:  [name], RN 12/10/2015, 5:17 PM

## 2015-12-10 NOTE — NC FL2 (Signed)
[place] MEDICAID FL2 LEVEL OF CARE SCREENING TOOL     IDENTIFICATION  Patient Name: Elizabeth [name] Birthdate: 1945-07-01 Sex: female Admission Date (Current Location): 12/08/2015  [facility] and [place] Number:  Producer, television/film/video and Address:  [facility], [address], [place], [place] [address]      Provider Number: [id number]  Attending Physician Name and Address:  Maretta Bees, MD  Relative Name and Phone Number:  Donny Pique April (daughter) - [phone]    Current Level of Care: Hospital Recommended Level of Care: Skilled Nursing Facility Prior Approval Number:    Date Approved/Denied:   PASRR Number: [id number]  Discharge Plan: SNF    Current Diagnoses: Patient Active Problem List   Diagnosis Date Noted  . Sepsis (HCC) 12/08/2015  . Chronic diastolic congestive heart failure (HCC) 08/06/2015  . Dyslipidemia associated with type 2 diabetes mellitus (HCC) 03/16/2015  . 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 Venous stenosis of left upper extremity 02/06/2015  . 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 DM (diabetes mellitus) type II controlled with renal manifestation (HCC) 06/30/2014  . S/P BKA (below knee amputation) unilateral (HCC) 05/28/2014  . Peripheral artery disease (HCC) 04/16/2014  . Moderate 3V CAD at cath 03/24/14   . Cardiomyopathy, ischemic   . Neuropathy (HCC) 03/17/2014  . Protein-calorie malnutrition, severe (HCC) 12/27/2013  . ESRD on hemodialysis (HCC) 10/17/2013  . Hypoglycemia 08/19/2013  . Hypothermia 04/24/2012  . Anemia of chronic disease 04/24/2012    Orientation RESPIRATION BLADDER Height & Weight     Self, Time, Situation, Place  Normal Indwelling catheter (Urethral catheter for urinary rentention (Chronic foley- Hemodialysis pt)) Weight: 80 lb 11 oz (36.6 kg) Height:  5\' 3"  (160 cm)  BEHAVIORAL SYMPTOMS/MOOD NEUROLOGICAL BOWEL NUTRITION STATUS   Continent Diet (Renal/Carb Modified)  AMBULATORY STATUS COMMUNICATION OF NEEDS Skin   Extensive Assist Verbally Other (Comment) (Skin: Dry;Flaky; Necrotic toes; Stage IV Pressure ulcer on sacrum)                       Personal Care Assistance Level of Assistance  Bathing, Feeding, Dressing Bathing Assistance: Maximum assistance Feeding assistance: Limited assistance Dressing Assistance: Maximum assistance     Functional Limitations Info  Sight, Hearing, Speech Sight Info: Adequate Hearing Info: Adequate Speech Info: Adequate    SPECIAL CARE FACTORS FREQUENCY                       Contractures Contractures Info: Not present    Additional Factors Info  Code Status, Allergies, Insulin Sliding Scale Code Status Info: Full Allergies Info: NKDA   Insulin Sliding Scale Info: insulin aspart 0-9 UnitsSubcutaneousTID WC (currently being held effective today)       Current Medications (12/10/2015):  This is the current hospital active medication list Current Facility-Administered Medications  Medication Dose Route Frequency Provider Last Rate Last Dose  . acetaminophen (TYLENOL) tablet 650 mg  650 mg Oral Q6H PRN Jonah Blue, MD       Or  . acetaminophen (TYLENOL) suppository 650 mg  650 mg Rectal Q6H PRN Jonah Blue, MD      . aspirin EC tablet 81 mg  81 mg Oral Daily Jonah Blue, MD      . calcium carbonate (dosed in mg elemental calcium) suspension 500 mg of elemental calcium  500 mg of elemental calcium Oral Q6H PRN Jonah Blue, MD      .  camphor-menthol (SARNA) lotion 1 application  1 application Topical Q8H PRN Jonah BlueJennifer Yates, MD       And  . hydrOXYzine (ATARAX/VISTARIL) tablet 25 mg  25 mg Oral Q8H PRN Jonah BlueJennifer Yates, MD      . ceFEPIme (MAXIPIME) 1 g in dextrose 5 % 50 mL IVPB  1 g Intravenous Daily Jonah BlueJennifer Yates, MD   1 g at 12/09/15 0615  . Darbepoetin Alfa (ARANESP) injection 60 mcg  60 mcg Intravenous Q Thu-HD Wille CelesteKathryn R Stovall, PA-C      .  docusate sodium (COLACE) capsule 100 mg  100 mg Oral BID Jonah BlueJennifer Yates, MD      . docusate sodium [facility](ENEMEEZ) enema 283 mg  1 enema Rectal PRN Jonah BlueJennifer Yates, MD      . doxercalciferol (HECTOROL) injection 4 mcg  4 mcg Intravenous Q T,Th,Sa-HD Wille CelesteKathryn R Stovall, PA-C      . enoxaparin (LOVENOX) injection 20 mg  20 mg Subcutaneous QHS Jonah BlueJennifer Yates, MD      . feeding supplement (NEPRO CARB STEADY) liquid 237 mL  237 mL Oral TID PRN Jonah BlueJennifer Yates, MD      . feeding supplement (NEPRO CARB STEADY) liquid 237 mL  237 mL Oral BID BM Wille CelesteKathryn R Stovall, PA-C   237 mL at 12/09/15 1126  . fluticasone (FLONASE) 50 MCG/ACT nasal spray 1 spray  1 spray Each Nare BID Jonah BlueJennifer Yates, MD      . lidocaine-prilocaine (EMLA) cream 1 application  1 application Topical Q T,Th,Sa-HD Jonah BlueJennifer Yates, MD      . multivitamin (RENA-VIT) tablet 1 tablet  1 tablet Oral QHS Jonah BlueJennifer Yates, MD      . ondansetron [facility](ZOFRAN) tablet 4 mg  4 mg Oral Q6H PRN Jonah BlueJennifer Yates, MD   4 mg at 12/09/15 1639   Or  . ondansetron (ZOFRAN) injection 4 mg  4 mg Intravenous Q6H PRN Jonah BlueJennifer Yates, MD   4 mg at 12/10/15 1004  . oxyCODONE-acetaminophen (PERCOCET/ROXICET) 5-325 MG per tablet 1 tablet  1 tablet Oral Q8H PRN Jonah BlueJennifer Yates, MD      . pantoprazole (PROTONIX) EC tablet 40 mg  40 mg Oral Daily Jonah BlueJennifer Yates, MD      . pravastatin (PRAVACHOL) tablet 20 mg  20 mg Oral QPM Jonah BlueJennifer Yates, MD      . promethazine (PHENERGAN) tablet 25 mg  25 mg Oral Q6H PRN Jonah BlueJennifer Yates, MD      . senna [facility](SENOKOT) tablet 8.6 mg  1 tablet Oral BID Jonah BlueJennifer Yates, MD      . sevelamer carbonate (RENVELA) tablet 1,600 mg  1,600 mg Oral TID WC Jonah BlueJennifer Yates, MD   1,600 mg at 12/10/15 0856  . sorbitol 70 % solution 30 mL  30 mL Oral PRN Jonah BlueJennifer Yates, MD      . vitamin C (ASCORBIC ACID) tablet 500 mg  500 mg Oral BID Jonah BlueJennifer Yates, MD         Discharge Medications: Please see discharge summary for a list of discharge medications.  Relevant Imaging  Results:  Relevant Lab Results:   Additional Information SSN:  [id number]; [facility] on Tuesday, Thursday, Saturday; Left BKA  Renard HamperLecretia Vlad Mayberry, Nathen MayStudent-Social Work [phone]

## 2015-12-10 NOTE — Progress Notes (Signed)
PROGRESS NOTE        PATIENT DETAILS Name: Elizabeth [name] Age: 70 y.o. Sex: female Date of Birth: 04/28/1945 Admit Date: 12/08/2015 Admitting Physician Jonah Blue, MD NWG:NFAOZH, Maxine Glenn, DO  Brief Narrative: Patient is a 70 y.o. female resident of a skilled nursing facility, with history of ESRD on hemodialysis, diabetes, left BKA, chronic indwelling Foley catheter admitted for evaluation and treatment of persistent hypoglycemia.  Subjective: No nausea. Seems to have more appetite today. CBGs better.   No chest pain No shortness of breath   Assessment/Plan: Principal Problem: Hypoglycemia: Resolved. No longer on D5 infusion. Continue to monitor off all insulin products for now, A1c 8.8.I suspect some amount of hypoglycemia is due to her erratic oral intake-she seems very picky about what she eats   Active Problems: Sepsis likely secondary to catheter related UTI: Mild sepsis pathophysiology evident on admission,  has since resolved. Unfortunately, apparently foley catheter has not been changed over 1 month-changed on 10/18. Urine culture growing multiple bacterial morphotypes-continue empiric cefepime for now.  History of insulin-dependent diabetes: Admitted with hypoglycemia-apparently on both Lantus and SSI prior to this admission-will plan on resuming sliding scale insulin over the next few days-continue to hold off on Lantus.   ESRD: Nephrology consulted for hemodialysis.  Chronic systolic heart failure (EF 30-35% by TTE on January 2016): Clinically compensated, volume removal with hemodialysis. 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 Reason-? Due to stage IV sacral decubitus ulcer-she still makes urine-have asked RN to change Foley catheter. Defer to  patient's PCP whether or not to continue it long-term  Anemia: Suspect secondary to chronic kidney disease- Hemoglobin stable-darbepoetin being dosed by nephrology  History of peripheral vascular disease-status post BKA  DVT Prophylaxis: Prophylactic Lovenox   Code Status: Full code   Family Communication: Multiple family members at bedside-they're indicated that patient will now go home with them and not back to her SNF.  Disposition Plan: Remain inpatient- family members are keen on taking patient home and not to SNF. Hopefully home on 10/20  Antimicrobial agents: See below  Procedures: None  CONSULTS:  nephrology  Time spent: 25 minutes-Greater than 50% of this time was spent in counseling, explanation of diagnosis, planning of further management, and coordination of care.  MEDICATIONS: Anti-infectives    Start     Dose/Rate Route Frequency Ordered Stop   12/09/15 0600  ceFEPIme (MAXIPIME) 1 g in dextrose 5 % 50 mL IVPB     1 g 100 mL/hr over 30 Minutes Intravenous Daily 12/08/15 2257     12/08/15 2245  ceFEPIme (MAXIPIME) 2 g in dextrose 5 % 50 mL IVPB  Status:  Discontinued     2 g 100 mL/hr over 30 Minutes Intravenous  Once 12/08/15 2240 12/08/15 2251   12/08/15 1900  cefTRIAXone (ROCEPHIN) 1 g in dextrose 5 % 50 mL IVPB     1 g 100 mL/hr over 30 Minutes Intravenous  Once 12/08/15 1858 12/08/15 2143      Scheduled Meds: . doxercalciferol      . aspirin EC  81 mg Oral Daily  . ceFEPime (MAXIPIME) IV  1 g Intravenous Daily  . darbepoetin (ARANESP) injection - DIALYSIS  60  mcg Intravenous Q Thu-HD  . docusate sodium  100 mg Oral BID  . doxercalciferol  4 mcg Intravenous Q T,Th,Sa-HD  . enoxaparin (LOVENOX) injection  20 mg Subcutaneous QHS  . feeding supplement (NEPRO CARB STEADY)  237 mL Oral BID BM  . fluticasone  1 spray Each Nare BID  . lidocaine-prilocaine  1 application Topical Q T,Th,Sa-HD  . multivitamin  1 tablet Oral QHS  . pantoprazole  40 mg  Oral Daily  . pravastatin  20 mg Oral QPM  . senna  1 tablet Oral BID  . sevelamer carbonate  1,600 mg Oral TID WC  . ascorbic acid  500 mg Oral BID   Continuous Infusions:  PRN Meds:.acetaminophen **OR** acetaminophen, calcium carbonate (dosed in mg elemental calcium), camphor-menthol **AND** hydrOXYzine, docusate sodium, feeding supplement (NEPRO CARB STEADY), ondansetron **OR** ondansetron (ZOFRAN) IV, oxyCODONE-acetaminophen, promethazine, sorbitol   PHYSICAL EXAM: Vital signs: Vitals:   12/10/15 1300 12/10/15 1330 12/10/15 1400 12/10/15 1427  BP: 116/69 (!) 102/41 (!) 114/50 116/61  Pulse: 88 87 80 93  Resp: 16 16 16 19   Temp:    98 F (36.7 C)  TempSrc:    Oral  SpO2:      Weight:    34.7 kg (76 lb 8 oz)  Height:       Filed Weights   12/09/15 2102 12/10/15 1051 12/10/15 1427  Weight: 36.8 kg (81 lb 3.2 oz) 36.6 kg (80 lb 11 oz) 34.7 kg (76 lb 8 oz)   Body mass index is 13.55 kg/m.   General appearance :Awake, alert, not in any distress. Speech Clear. Chronically sick appearing Eyes:, pupils equally reactive to light and accomodation,no scleral icterus.Pink conjunctiva HEENT: Atraumatic and Normocephalic Neck: supple, no JVD. No cervical lymphadenopathy. No thyromegaly Resp:Good air entry bilaterally, no added sounds  CVS: S1 S2 regular, no murmurs.  GI: Bowel sounds present, Non tender and not distended with no gaurding, rigidity or rebound.No organomegaly Extremities: Left BKA-stump without any acute issues Neurology:  speech clear,Non focal Psychiatric: Normal judgment and insight. Alert and oriented x 3. Normal mood. Musculoskeletal:No digital cyanosis Skin:No Rash, warm and dry Wounds: Stage IV sacral ulcer  I have personally reviewed following labs and imaging studies  LABORATORY DATA: CBC:  Recent Labs Lab 12/08/15 1752 12/09/15 0149 12/10/15 1045  WBC 13.2* 7.8 8.9  NEUTROABS 12.1*  --   --   HGB 11.4* 9.7* 9.6*  HCT 34.4* 29.5* 29.3*  MCV 84.1  82.2 83.2  PLT 139* 148* 189    Basic Metabolic Panel:  Recent Labs Lab 12/08/15 1752 12/09/15 0149 12/10/15 1045  NA 135 131* 129*  K 3.2* 4.4 4.8  CL 98* 92* 92*  CO2 24 27 24   GLUCOSE 125* 141* 172*  BUN 25* 31* 42*  CREATININE 3.09* 3.59* 4.94*  CALCIUM 8.1* 8.1* 9.0  PHOS  --   --  6.7*    GFR: Estimated Creatinine Clearance: 5.7 mL/min (by C-G formula based on SCr of 4.94 mg/dL (H)).  Liver Function Tests:  Recent Labs Lab 12/08/15 1752 12/10/15 1045  AST 24  --   ALT 28  --   ALKPHOS 123  --   BILITOT 0.6  --   PROT 7.2  --   ALBUMIN 2.8* 2.5*    Recent Labs Lab 12/08/15 1752  LIPASE 17   No results for input(s): AMMONIA in the last 168 hours.  Coagulation Profile:  Recent Labs Lab 12/08/15 2302  INR 1.21    Cardiac Enzymes:  No results for input(s): CKTOTAL, CKMB, CKMBINDEX, TROPONINI in the last 168 hours.  BNP (last 3 results) No results for input(s): PROBNP in the last 8760 hours.  HbA1C:  Recent Labs  12/09/15 0812  HGBA1C 8.8*    CBG:  Recent Labs Lab 12/09/15 1748 12/09/15 1840 12/09/15 2041 12/10/15 0501 12/10/15 0715  GLUCAP 62* 82 114* 70 123*    Lipid Profile: No results for input(s): CHOL, HDL, LDLCALC, TRIG, CHOLHDL, LDLDIRECT in the last 72 hours.  Thyroid Function Tests: No results for input(s): TSH, T4TOTAL, FREET4, T3FREE, THYROIDAB in the last 72 hours.  Anemia Panel: No results for input(s): VITAMINB12, FOLATE, FERRITIN, TIBC, IRON, RETICCTPCT in the last 72 hours.  Urine analysis:    Component Value Date/Time   COLORURINE RED (A) 12/08/2015 1746   APPEARANCEUR TURBID (A) 12/08/2015 1746   LABSPEC 1.013 12/08/2015 1746   PHURINE 8.5 (H) 12/08/2015 1746   GLUCOSEU NEGATIVE 12/08/2015 1746   HGBUR LARGE (A) 12/08/2015 1746   BILIRUBINUR SMALL (A) 12/08/2015 1746   KETONESUR NEGATIVE 12/08/2015 1746   PROTEINUR >300 (A) 12/08/2015 1746   UROBILINOGEN 0.2 06/25/2014 1436   NITRITE NEGATIVE  12/08/2015 1746   LEUKOCYTESUR LARGE (A) 12/08/2015 1746    Sepsis Labs: Lactic Acid, Venous    Component Value Date/Time   LATICACIDVEN 1.3 12/09/2015 0149    MICROBIOLOGY: Recent Results (from the past 240 hour(s))  Urine culture     Status: Abnormal   Collection Time: 12/08/15  5:46 PM  Result Value Ref Range Status   Specimen Description URINE, RANDOM  Final   Special Requests NONE  Final   Culture MULTIPLE SPECIES PRESENT, SUGGEST RECOLLECTION (A)  Final   Report Status 12/09/2015 FINAL  Final  Blood Culture (routine x 2)     Status: None (Preliminary result)   Collection Time: 12/08/15  7:20 PM  Result Value Ref Range Status   Specimen Description BLOOD RIGHT ARM  Final   Special Requests BOTTLES DRAWN AEROBIC AND ANAEROBIC 5CC  Final   Culture NO GROWTH < 24 HOURS  Final   Report Status PENDING  Incomplete  Blood Culture (routine x 2)     Status: None (Preliminary result)   Collection Time: 12/08/15  7:20 PM  Result Value Ref Range Status   Specimen Description BLOOD RIGHT HAND  Final   Special Requests BOTTLES DRAWN AEROBIC AND ANAEROBIC 5CC  Final   Culture NO GROWTH < 24 HOURS  Final   Report Status PENDING  Incomplete  MRSA PCR Screening     Status: None   Collection Time: 12/08/15 11:56 PM  Result Value Ref Range Status   MRSA by PCR NEGATIVE NEGATIVE Final    Comment:        The GeneXpert MRSA Assay (FDA approved for NASAL specimens only), is one component of a comprehensive MRSA colonization surveillance program. It is not intended to diagnose MRSA infection nor to guide or monitor treatment for MRSA infections.     RADIOLOGY STUDIES/RESULTS: Dg Chest Port 1 View  Result Date: 12/08/2015 CLINICAL DATA:  Sepsis and hypothermia EXAM: PORTABLE CHEST 1 VIEW COMPARISON:  Chest radiograph 03/09/2015 FINDINGS: There is calcification within the aortic arch. Small bilateral pleural effusions. The right-sided effusion has increased in size. There diffusely  increased pulmonary markings throughout both lungs. No focal area of consolidation. No pneumothorax. IMPRESSION: Slightly increased size of right pleural effusion. Unchanged left pleural effusion. No focal airspace consolidation. Electronically Signed   By: Chrisandra Netters.D.  On: 12/08/2015 18:04     LOS: 2 days   Jeoffrey Massed, MD  [facility] Pager:[phone]  If 7PM-7AM, please contact night-coverage [email or web address] Password [email or web address] 12/10/2015, 3:07 PM

## 2015-12-10 NOTE — Progress Notes (Signed)
Subjective:  No cos/" I'm going to hemodialysis this am " am bs 70, 123  Objective Vital signs in last 24 hours: Vitals:   12/09/15 1000 12/09/15 1805 12/09/15 2102 12/10/15 0455  BP: (!) 104/57 (!) 98/51 (!) 100/58 108/61  Pulse: 100 98 (!) 101 95  Resp: 18 18 17 18   Temp: 98.7 F (37.1 C) 97.8 F (36.6 C) 99.7 F (37.6 C) 98.6 F (37 C)  TempSrc: Oral Oral Oral Oral  SpO2: 98% 98% 100% 100%  Weight:   36.8 kg (81 lb 3.2 oz)   Height:       Weight change: 0.953 kg (2 lb 1.6 oz)  Physical Exam: General:Thin / Frail, elderly female, NAD . Baseline MS Lungs: CTA bilat Breathing is unlabored. Heart: RRR; no murmur or rub Abdomen: Soft, non-tender, non-distended / Bs +  Lower extremities: No LE edema, L BKA./R 3rd 4th toes dry black  Skin  Dialysis Access: LUA  AVF + thrill and bruit    OP Dialysis Orders:  [facility] on TTS schedule. 3:30 hours, EDW 35kg, AVF, BFR 350/DFR 500, 2K/2.25Ca bath - Heparin 1500 units IV bolus q HD - Mircera 75mcg q 4 weeks - Hectoral 4mcg IV q HD  :Problem/Plan 1.  Hypoglycemia: Improving s/p D10, then D5W infusion.Taking pos now / plan Per primary 2. .Possible UTI/ hypothermia: temp's better, chronic Indwelling foley( ho Neurogenic bladder). On Cefepime. BCx and UCx 10/17 pending. 3.  ESRD:  Continue TTS schedule, next HD 10/19. 4.  Hypertension/volume: BP controlled. Per bed wt 1.8 >edw / CXR with pleural effusions. Will attempt increased UF with HD./ on no bp meds  5. 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  Anemia: Hgb 9.7. Start Aranesp 60mcg with next HD. 7.  Metabolic bone disease: Corec Ca = 9.1. Continue Renvela with meals and will resume VDRA. Pending hd today Phos level 8. DM Type 2 =per admit 9. PVD = Ho L BKA / R toe dry gang. Per admit  10.  Nutrition: Alb 2.8; will start Nepro supplements./ renal vit. / Renal Mariane Duval/carb mod  Diet   [name], PA-C [facility] Beeper [phone] 12/10/2015,8:50 AM  LOS: 2 days   Pt seen, examined and agree w A/P as above.  Vinson Moselleob Korah Hufstedler MD [facility] pager [phone]   12/10/2015, 12:44 PM    Labs: Basic Metabolic Panel:  Recent Labs Lab 12/08/15 1752 12/09/15 0149  NA 135 131*  K 3.2* 4.4  CL 98* 92*  CO2 24 27  GLUCOSE 125* 141*  BUN 25* 31*  CREATININE 3.09* 3.59*  CALCIUM 8.1* 8.1*   Liver Function Tests:  Recent Labs Lab 12/08/15 1752  AST 24  ALT 28  ALKPHOS 123  BILITOT 0.6  PROT 7.2  ALBUMIN 2.8*    Recent Labs Lab 12/08/15 1752  LIPASE 17   No results for input(s): AMMONIA in the last 168 hours. CBC:  Recent Labs Lab 12/08/15 1752 12/09/15 0149  WBC 13.2* 7.8  NEUTROABS 12.1*  --   HGB 11.4* 9.7*  HCT 34.4* 29.5*  MCV 84.1 82.2  PLT 139* 148*   Cardiac Enzymes: No results for input(s): CKTOTAL, CKMB, CKMBINDEX, TROPONINI in the last 168 hours. CBG:  Recent Labs Lab 12/09/15 1748 12/09/15 1840 12/09/15 2041 12/10/15 0501 12/10/15 0715  GLUCAP 62* 82 114* 70 123*    Studies/Results: Dg Chest Port 1 View  Result Date: 12/08/2015  CLINICAL DATA:  Sepsis and hypothermia EXAM: PORTABLE CHEST 1 VIEW COMPARISON:  Chest radiograph 03/09/2015 FINDINGS: There is calcification within the aortic arch. Small bilateral pleural effusions. The right-sided effusion has increased in size. There diffusely increased pulmonary markings throughout both lungs. No focal area of consolidation. No pneumothorax. IMPRESSION: Slightly increased size of right pleural effusion. Unchanged left pleural effusion. No focal airspace consolidation. Electronically Signed   By: Deatra Robinson M.D.   On: 12/08/2015 18:04   Medications:   . aspirin EC  81 mg Oral Daily  . ceFEPime (MAXIPIME) IV  1 g Intravenous Daily  . darbepoetin (ARANESP) injection - DIALYSIS  60 mcg Intravenous Q Thu-HD  . docusate sodium  100 mg Oral BID  . doxercalciferol  4 mcg Intravenous  Q T,Th,Sa-HD  . enoxaparin (LOVENOX) injection  20 mg Subcutaneous QHS  . feeding supplement (NEPRO CARB STEADY)  237 mL Oral BID BM  . fluticasone  1 spray Each Nare BID  . insulin aspart  0-9 Units Subcutaneous TID WC  . lidocaine-prilocaine  1 application Topical Q T,Th,Sa-HD  . multivitamin  1 tablet Oral QHS  . pantoprazole  40 mg Oral Daily  . pravastatin  20 mg Oral QPM  . senna  1 tablet Oral BID  . sevelamer carbonate  1,600 mg Oral TID WC  . ascorbic acid  500 mg Oral BID

## 2015-12-10 NOTE — Progress Notes (Signed)
PT Cancellation Note  Patient Details Name: Elizabeth [name] MRN: [id number] DOB: 1945-05-23   Cancelled Treatment:     PT order received. Pt is currently in HD and unavailable for evaluation. Will attempt again tomorrow.   Greggory Stallion 12/10/2015, 11:05 AM

## 2015-12-10 NOTE — Consult Note (Signed)
[employer] CM Primary Care Navigator  12/10/2015  Saul Nyquist 10-Aug-1945 [id number]  Met with patient at the bedside to identify possible discharge needs. Patient has flat affect and was able to respond during conversation. Patient confirms that Gildardo Cranker, DO [facility]) had been seeing her at [facility]. She states being in that facility for couple years which provides her care needs.  MD note states remains inpatient and SNF on discharge.  She mentioned going to dialysis on Tuesday-Thursday-Saturday at [facility]. Patient stated she will be going back to [facility] upon discharge.         For additional questions please contact:  Edwena Felty A. Raza Bayless, BSN, RN-BC [facility] PRIMARY CARE Navigator Cell: [phone]

## 2015-12-10 NOTE — Progress Notes (Cosign Needed)
CSW Intern and CSW were informed by Lurena Joiner, beside nurse that patient's family has decided to take patient home at discharge versus returning to [facility].  CSW signing off, however please re-consult if any other social work needs arise prior to discharge.    Renard Hamper, CSW Intern  **Note completed by CSW Intern, Renard Hamper. Progress note reviewed and approved by CSW as written.  Genelle Bal, MSW, LCSW Licensed Clinical Social Worker Clinical Social Work Department [employer] [phone]

## 2015-12-11 DIAGNOSIS — Z515 Encounter for palliative care: Secondary | ICD-10-CM

## 2015-12-11 LAB — GLUCOSE, CAPILLARY
GLUCOSE-CAPILLARY: 178 mg/dL — AB (ref 65–99)
Glucose-Capillary: 99 mg/dL (ref 65–99)
Glucose-Capillary: 168 mg/dL — ABNORMAL HIGH (ref 65–99)
Glucose-Capillary: 151 mg/dL — ABNORMAL HIGH (ref 65–99)

## 2015-12-11 NOTE — Progress Notes (Signed)
Subjective:  Pt and family are in discussion about possibility of stopping dialysis, pt tired of dialysis.  Disposition options are being actively discussed (home w hospice, hospice facility, SNF w hospice, etc.).   Objective Vital signs in last 24 hours: Vitals:   12/10/15 1700 12/10/15 2054 12/11/15 0444 12/11/15 1017  BP: (!) 112/52 (!) 121/57 (!) 106/46 (!) 112/51  Pulse: 88 97 93 90  Resp: 18 17 18 18   Temp: 98.2 F (36.8 C) 99.7 F (37.6 C) 98.8 F (37.1 C) 98 F (36.7 C)  TempSrc: Oral Oral Oral Oral  SpO2: 98% 99% 99% 99%  Weight:  36.3 kg (80 lb)    Height:       Weight change: -0.232 kg (-8.2 oz)  Physical Exam: General:Thin / Frail, elderly female, NAD . Baseline MS Lungs: CTA bilat Breathing is unlabored. Heart: RRR; no murmur or rub Abdomen: Soft, non-tender, non-distended / Bs +  Lower extremities: No LE edema, L BKA./R 3rd 4th toes dry black  Skin  Dialysis Access: LUA  AVF + thrill and bruit    [facility]  [facility]. 3:30 hours, EDW 35kg, AVF, BFR 350/DFR 500, 2K/2.25Ca bath - Heparin 1500 units IV bolus q HD - Mircera 75mcg q 4 weeks - Hectoral 4mcg IV q HD  :Problem/Plan 1.  Hypoglycemia: resolved 2. .Possible UTI/ hypothermia: on abx 3.  ESRD:  hold HD for now as pt  considering withdrawal from dialysis 4.  Hypertension/volume: stable 5.  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  Anemia: Hgb 9.7. Start Aranesp 60mcg with next HD. 7.  Metabolic bone disease: Corec Ca = 9.1. Continue Renvela with meals and will resume VDRA. Pending hd today Phos level 8. DM Type 2 =per admit  Vinson Moselleob Naamah Boggess MD [facility] pager [phone]   12/11/2015, 4:24 PM    Labs: Basic Metabolic Panel:  Recent Labs Lab 12/08/15 1752 12/09/15 0149 12/10/15 1045  NA 135 131* 129*  K 3.2* 4.4 4.8  CL 98* 92* 92*  CO2 24 27 24   GLUCOSE 125* 141* 172*  BUN 25* 31* 42*   CREATININE 3.09* 3.59* 4.94*  CALCIUM 8.1* 8.1* 9.0  PHOS  --   --  6.7*   Liver Function Tests:  Recent Labs Lab 12/08/15 1752 12/10/15 1045  AST 24  --   ALT 28  --   ALKPHOS 123  --   BILITOT 0.6  --   PROT 7.2  --   ALBUMIN 2.8* 2.5*    Recent Labs Lab 12/08/15 1752  LIPASE 17   No results for input(s): AMMONIA in the last 168 hours. CBC:  Recent Labs Lab 12/08/15 1752 12/09/15 0149 12/10/15 1045  WBC 13.2* 7.8 8.9  NEUTROABS 12.1*  --   --   HGB 11.4* 9.7* 9.6*  HCT 34.4* 29.5* 29.3*  MCV 84.1 82.2 83.2  PLT 139* 148* 189   Cardiac Enzymes: No results for input(s): CKTOTAL, CKMB, CKMBINDEX, TROPONINI in the last 168 hours. CBG:  Recent Labs Lab 12/10/15 1739 12/10/15 2125 12/11/15 0724 12/11/15 1120 12/11/15 1616  GLUCAP 102* 162* 99 178* 168*    Studies/Results: No results found. Medications:   . aspirin EC  81 mg Oral Daily  . ceFEPime (MAXIPIME) IV  1 g Intravenous Daily  . darbepoetin (ARANESP) injection - DIALYSIS  60 mcg Intravenous Q Thu-HD  . docusate sodium  100 mg Oral  BID  . doxercalciferol  4 mcg Intravenous Q T,Th,Sa-HD  . enoxaparin (LOVENOX) injection  20 mg Subcutaneous QHS  . feeding supplement (NEPRO CARB STEADY)  237 mL Oral BID BM  . fluticasone  1 spray Each Nare BID  . lidocaine-prilocaine  1 application Topical Q T,Th,Sa-HD  . multivitamin  1 tablet Oral QHS  . pantoprazole  40 mg Oral Daily  . pravastatin  20 mg Oral QPM  . senna  1 tablet Oral BID  . sevelamer carbonate  1,600 mg Oral TID WC  . ascorbic acid  500 mg Oral BID

## 2015-12-11 NOTE — Progress Notes (Signed)
PT Cancellation Note  Patient Details Name: Elizabeth [name] MRN: [id number] DOB: [birth year]   Cancelled Treatment:    Reason Eval/Treat Not Completed: Patient declined, no reason specified. Pt reports she is returning to SNF, although notes indicate she is going home with family and hospice with meeting this afternoon.  Will check back tomorrow and clarify d/c plan to determine if family education and DME assessment is needed if she does indeed plan on going home with family.     Kadyn Guild LUBECK 12/11/2015, 9:40 AM

## 2015-12-11 NOTE — Progress Notes (Signed)
[facility] RN Note   Received request from [facility] of patient/family interest in hospice services at home after discharge .   Visited with patient and granddaughter . Lengthy discussion with patient and granddaughter to review differences between going home with Hospice versus  Residential hospice.  Patient did indicate that she is interested in residential hospice.    Patient seems unclear as to what she wishes to do. She did express that she was planning to stop dialysis; however this was followed by "I know my time is limited, and I don't want to do anything to speed things up".    [facility] Brochure left with patient.  She is going to think about her decision overnight and [facility] will follow up with her again tomorrow.    RNCM [employer] made aware.   Lavone Neri, RN  [facility] Liaison  [phone]

## 2015-12-11 NOTE — Care Management Important Message (Signed)
Important Message  Patient Details  Name: Elizabeth [name] MRN: [id number] Date of Birth: [birth year]   Medicare Important Message Given:  Yes    Elizabeth [name] 12/11/2015, 4:07 PM

## 2015-12-11 NOTE — Progress Notes (Signed)
PROGRESS NOTE        PATIENT DETAILS Name: Elizabeth [name] Age: 70 y.o. Sex: female Date of Birth: 17-Jun-1945 Admit Date: 12/08/2015 Admitting Physician Jonah Blue, MD ZOX:[id number], Maxine Glenn, DO  Brief Narrative: Patient is a 70 y.o. female resident of a skilled nursing facility, with history of ESRD on hemodialysis, diabetes, left BKA, chronic indwelling Foley catheter admitted for evaluation and treatment of persistent hypoglycemia.  Subjective: No nausea. Seems to have more appetite today. CBGs better.   No chest pain No shortness of breath   Assessment/Plan: Principal Problem: Hypoglycemia: Resolved. No longer on D5 infusion. Continue to monitor off all insulin products for now, A1c 8.8.I suspect some amount of hypoglycemia is due to her erratic oral intake-she seems very picky about what she eats, she also likely has very brittle diabetes at baseline.  Active Problems: Sepsis likely secondary to catheter related UTI: Mild sepsis pathophysiology evident on admission,  has since resolved. Unfortunately, apparently foley catheter has not been changed over 1 month-changed on 10/18. Urine culture growing multiple bacterial morphotypes-continue empiric cefepime for now.  History of insulin-dependent diabetes: Admitted with hypoglycemia-apparently on both Lantus and SSI prior to this admission-will plan on resuming sliding scale insulin over the next few days-continue to hold off on Lantus.   ESRD: Nephrology consulted for hemodialysis.  Chronic systolic heart failure (EF 30-35% by TTE on January 2016): Clinically compensated, volume removal with hemodialysis. 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 Reason-? Due to stage IV sacral decubitus ulcer-she still makes  urine-have asked RN to change Foley catheter. Defer to patient's PCP whether or not to continue it long-term  Anemia: Suspect secondary to chronic kidney disease- Hemoglobin stable-darbepoetin being dosed by nephrology  History of peripheral vascular disease-status post BKA  Other: Granddaughter and patient's niece keen on taking patient to her house with home hospice-this morning patient keen on going back to SNF. We will await arrival of family for discussion later today before deciding on appropriate disposition.  DVT Prophylaxis: Prophylactic Lovenox   Code Status: Full code   Family Communication: None at bedside-spoke briefly to the grand-daughter-who was on the way to the hospital  Disposition Plan: Remain inpatient- await family discussion/decision regarding home with hospice or SNF  Antimicrobial agents: See below  Procedures: None  CONSULTS:  nephrology  Time spent: 25 minutes-Greater than 50% of this time was spent in counseling, explanation of diagnosis, planning of further management, and coordination of care.  MEDICATIONS: Anti-infectives    Start     Dose/Rate Route Frequency Ordered Stop   12/09/15 0600  ceFEPIme (MAXIPIME) 1 g in dextrose 5 % 50 mL IVPB     1 g 100 mL/hr over 30 Minutes Intravenous Daily 12/08/15 2257     12/08/15 2245  ceFEPIme (MAXIPIME) 2 g in dextrose 5 % 50 mL IVPB  Status:  Discontinued     2 g 100 mL/hr over 30 Minutes Intravenous  Once 12/08/15 2240 12/08/15 2251   12/08/15 1900  cefTRIAXone (ROCEPHIN) 1 g in dextrose 5 % 50 mL IVPB     1 g 100 mL/hr over 30 Minutes Intravenous  Once 12/08/15 1858 12/08/15 2143      Scheduled Meds: .  aspirin EC  81 mg Oral Daily  . ceFEPime (MAXIPIME) IV  1 g Intravenous Daily  . darbepoetin (ARANESP) injection - DIALYSIS  60 mcg Intravenous Q Thu-HD  . docusate sodium  100 mg Oral BID  . doxercalciferol  4 mcg Intravenous Q T,Th,Sa-HD  . enoxaparin (LOVENOX) injection  20 mg Subcutaneous  QHS  . feeding supplement (NEPRO CARB STEADY)  237 mL Oral BID BM  . fluticasone  1 spray Each Nare BID  . lidocaine-prilocaine  1 application Topical Q T,Th,Sa-HD  . multivitamin  1 tablet Oral QHS  . pantoprazole  40 mg Oral Daily  . pravastatin  20 mg Oral QPM  . senna  1 tablet Oral BID  . sevelamer carbonate  1,600 mg Oral TID WC  . ascorbic acid  500 mg Oral BID   Continuous Infusions:  PRN Meds:.acetaminophen **OR** acetaminophen, calcium carbonate (dosed in mg elemental calcium), camphor-menthol **AND** hydrOXYzine, docusate sodium, feeding supplement (NEPRO CARB STEADY), ondansetron **OR** ondansetron (ZOFRAN) IV, oxyCODONE-acetaminophen, promethazine, sorbitol   PHYSICAL EXAM: Vital signs: Vitals:   12/10/15 1700 12/10/15 2054 12/11/15 0444 12/11/15 1017  BP: (!) 112/52 (!) 121/57 (!) 106/46 (!) 112/51  Pulse: 88 97 93 90  Resp: 18 17 18 18   Temp: 98.2 F (36.8 C) 99.7 F (37.6 C) 98.8 F (37.1 C) 98 F (36.7 C)  TempSrc: Oral Oral Oral Oral  SpO2: 98% 99% 99% 99%  Weight:  36.3 kg (80 lb)    Height:       Filed Weights   12/10/15 1051 12/10/15 1427 12/10/15 2054  Weight: 36.6 kg (80 lb 11 oz) 34.7 kg (76 lb 8 oz) 36.3 kg (80 lb)   Body mass index is 14.17 kg/m.   General appearance :Awake, alert, not in any distress. Speech Clear. Chronically sick appearing Eyes:, pupils equally reactive to light and accomodation,no scleral icterus.Pink conjunctiva HEENT: Atraumatic and Normocephalic Neck: supple, no JVD. No cervical lymphadenopathy. No thyromegaly Resp:Good air entry bilaterally, no added sounds  CVS: S1 S2 regular, no murmurs.  GI: Bowel sounds present, Non tender and not distended with no gaurding, rigidity or rebound.No organomegaly Extremities: Left BKA-stump without any acute issues Neurology:  speech clear,Non focal Psychiatric: Normal judgment and insight. Alert and oriented x 3. Normal mood. Musculoskeletal:No digital cyanosis Skin:No Rash, warm  and dry Wounds: Stage IV sacral ulcer  I have personally reviewed following labs and imaging studies  LABORATORY DATA: CBC:  Recent Labs Lab 12/08/15 1752 12/09/15 0149 12/10/15 1045  WBC 13.2* 7.8 8.9  NEUTROABS 12.1*  --   --   HGB 11.4* 9.7* 9.6*  HCT 34.4* 29.5* 29.3*  MCV 84.1 82.2 83.2  PLT 139* 148* 189    Basic Metabolic Panel:  Recent Labs Lab 12/08/15 1752 12/09/15 0149 12/10/15 1045  NA 135 131* 129*  K 3.2* 4.4 4.8  CL 98* 92* 92*  CO2 24 27 24   GLUCOSE 125* 141* 172*  BUN 25* 31* 42*  CREATININE 3.09* 3.59* 4.94*  CALCIUM 8.1* 8.1* 9.0  PHOS  --   --  6.7*    GFR: Estimated Creatinine Clearance: 6 mL/min (by C-G formula based on SCr of 4.94 mg/dL (H)).  Liver Function Tests:  Recent Labs Lab 12/08/15 1752 12/10/15 1045  AST 24  --   ALT 28  --   ALKPHOS 123  --   BILITOT 0.6  --   PROT 7.2  --   ALBUMIN 2.8* 2.5*    Recent Labs Lab 12/08/15  1752  LIPASE 17   No results for input(s): AMMONIA in the last 168 hours.  Coagulation Profile:  Recent Labs Lab 12/08/15 2302  INR 1.21    Cardiac Enzymes: No results for input(s): CKTOTAL, CKMB, CKMBINDEX, TROPONINI in the last 168 hours.  BNP (last 3 results) No results for input(s): PROBNP in the last 8760 hours.  HbA1C:  Recent Labs  12/09/15 0812  HGBA1C 8.8*    CBG:  Recent Labs Lab 12/10/15 0715 12/10/15 1739 12/10/15 2125 12/11/15 0724 12/11/15 1120  GLUCAP 123* 102* 162* 99 178*    Lipid Profile: No results for input(s): CHOL, HDL, LDLCALC, TRIG, CHOLHDL, LDLDIRECT in the last 72 hours.  Thyroid Function Tests: No results for input(s): TSH, T4TOTAL, FREET4, T3FREE, THYROIDAB in the last 72 hours.  Anemia Panel: No results for input(s): VITAMINB12, FOLATE, FERRITIN, TIBC, IRON, RETICCTPCT in the last 72 hours.  Urine analysis:    Component Value Date/Time   COLORURINE RED (A) 12/08/2015 1746   APPEARANCEUR TURBID (A) 12/08/2015 1746   LABSPEC 1.013  12/08/2015 1746   PHURINE 8.5 (H) 12/08/2015 1746   GLUCOSEU NEGATIVE 12/08/2015 1746   HGBUR LARGE (A) 12/08/2015 1746   BILIRUBINUR SMALL (A) 12/08/2015 1746   KETONESUR NEGATIVE 12/08/2015 1746   PROTEINUR >300 (A) 12/08/2015 1746   UROBILINOGEN 0.2 06/25/2014 1436   NITRITE NEGATIVE 12/08/2015 1746   LEUKOCYTESUR LARGE (A) 12/08/2015 1746    Sepsis Labs: Lactic Acid, Venous    Component Value Date/Time   LATICACIDVEN 1.3 12/09/2015 0149    MICROBIOLOGY: Recent Results (from the past 240 hour(s))  Urine culture     Status: Abnormal   Collection Time: 12/08/15  5:46 PM  Result Value Ref Range Status   Specimen Description URINE, RANDOM  Final   Special Requests NONE  Final   Culture MULTIPLE SPECIES PRESENT, SUGGEST RECOLLECTION (A)  Final   Report Status 12/09/2015 FINAL  Final  Blood Culture (routine x 2)     Status: None (Preliminary result)   Collection Time: 12/08/15  7:20 PM  Result Value Ref Range Status   Specimen Description BLOOD RIGHT ARM  Final   Special Requests BOTTLES DRAWN AEROBIC AND ANAEROBIC 5CC  Final   Culture NO GROWTH 3 DAYS  Final   Report Status PENDING  Incomplete  Blood Culture (routine x 2)     Status: None (Preliminary result)   Collection Time: 12/08/15  7:20 PM  Result Value Ref Range Status   Specimen Description BLOOD RIGHT HAND  Final   Special Requests BOTTLES DRAWN AEROBIC AND ANAEROBIC 5CC  Final   Culture NO GROWTH 3 DAYS  Final   Report Status PENDING  Incomplete  MRSA PCR Screening     Status: None   Collection Time: 12/08/15 11:56 PM  Result Value Ref Range Status   MRSA by PCR NEGATIVE NEGATIVE Final    Comment:        The GeneXpert MRSA Assay (FDA approved for NASAL specimens only), is one component of a comprehensive MRSA colonization surveillance program. It is not intended to diagnose MRSA infection nor to guide or monitor treatment for MRSA infections.     RADIOLOGY STUDIES/RESULTS: Dg Chest Port 1  View  Result Date: 12/08/2015 CLINICAL DATA:  Sepsis and hypothermia EXAM: PORTABLE CHEST 1 VIEW COMPARISON:  Chest radiograph 03/09/2015 FINDINGS: There is calcification within the aortic arch. Small bilateral pleural effusions. The right-sided effusion has increased in size. There diffusely increased pulmonary markings throughout both lungs. No focal  area of consolidation. No pneumothorax. IMPRESSION: Slightly increased size of right pleural effusion. Unchanged left pleural effusion. No focal airspace consolidation. Electronically Signed   By: Deatra Robinson M.D.   On: 12/08/2015 18:04     LOS: 3 days   Jeoffrey Massed, MD  [facility] Pager:[phone]  If 7PM-7AM, please contact night-coverage [email or web address] Password [email or web address] 12/11/2015, 1:48 PM

## 2015-12-11 NOTE — Progress Notes (Signed)
Met with  granddaughter and patient at bedside, there are still in discussion about appropriate disposition. They are in discussion about stopping hemodialysis-it seems that the patient is just tired of continuing dialysis, being in and out of hospitals. Different options including home with hospice, home with home health services, back to SNF or residential hospice discussed. Family still in discussion. We'll ask palliative care to evaluate as well. We will touch base with family tomorrow.

## 2015-12-11 NOTE — Progress Notes (Signed)
Due to high volume of referrals there maybe a delay in Palliative Medicine initial consult. Thank you for your patience, Eduard Roux, ANP

## 2015-12-11 NOTE — Progress Notes (Signed)
Responded to grand-daughter's request for help with advanced directive forms for pt. Pt. sleeping. Form was comprehensively filled out, but signed already, so procured another blank form and explained pt would have to sign in presence of notary and witnesses Monday. Provided emotional/spirieutal support. Grand-daughter will request nurse to page chaplain to complete form on Monday.    12/11/15 1600  Clinical Encounter Type  Visited With Patient and family together  Visit Type Initial  Referral From Family  Spiritual Encounters  Spiritual Needs Literature;Brochure;Emotional  Stress Factors  Patient Stress Factors Health changes;Loss of control  Family Stress Factors Exhausted;Family relationships;Health changes;Loss of control  Chaplain available for follow-up.

## 2015-12-12 LAB — GLUCOSE, CAPILLARY
GLUCOSE-CAPILLARY: 74 mg/dL (ref 65–99)
GLUCOSE-CAPILLARY: 144 mg/dL — AB (ref 65–99)
GLUCOSE-CAPILLARY: 120 mg/dL — AB (ref 65–99)

## 2015-12-12 MED ORDER — SODIUM CHLORIDE 0.9 % IV SOLN: 100.0000 mL | INTRAVENOUS | Status: DC | PRN

## 2015-12-12 MED ORDER — DOXERCALCIFEROL 4 MCG/2ML IV SOLN
INTRAVENOUS | Status: AC
  Administered 2015-12-12: 4 ug via INTRAVENOUS
  Filled 2015-12-12: qty 2

## 2015-12-12 MED ORDER — LIDOCAINE-PRILOCAINE 2.5-2.5 % EX CREA: 1.0000 "application " | TOPICAL_CREAM | CUTANEOUS | Status: DC | PRN

## 2015-12-12 MED ORDER — ALTEPLASE 2 MG IJ SOLR: 2.0000 mg | Freq: Once | INTRAMUSCULAR | Status: DC | PRN

## 2015-12-12 MED ORDER — HEPARIN SODIUM (PORCINE) 1000 UNIT/ML DIALYSIS: 1000.0000 [IU] | INTRAMUSCULAR | Status: DC | PRN

## 2015-12-12 MED ORDER — PENTAFLUOROPROP-TETRAFLUOROETH EX AERO: 1.0000 "application " | INHALATION_SPRAY | CUTANEOUS | Status: DC | PRN

## 2015-12-12 MED ORDER — LIDOCAINE HCL (PF) 1 % IJ SOLN: 5.0000 mL | INTRAMUSCULAR | Status: DC | PRN

## 2015-12-12 NOTE — Progress Notes (Signed)
Subjective:  Pt and family are in discussion about possibility of stopping dialysis, pt tired of dialysis.  Disposition options are being actively discussed (home w hospice, hospice facility, SNF w hospice, etc.).   Objective Vital signs in last 24 hours: Vitals:   12/12/15 0800 12/12/15 0830 12/12/15 0900 12/12/15 0930  BP: (!) 111/55 (!) 117/51 (!) 120/58 (!) 114/54  Pulse: 75 73 (!) 102 75  Resp:      Temp:      TempSrc:      SpO2:      Weight:      Height:       Weight change: -0.1 kg (-3.5 oz)  Physical Exam: General:Thin / Frail, elderly female, NAD . Baseline MS Lungs: CTA bilat Breathing is unlabored. Heart: RRR; no murmur or rub Abdomen: Soft, non-tender, non-distended / Bs +  Lower extremities: No LE edema, L BKA./R 3rd 4th toes dry black  Skin  Dialysis Access: LUA  AVF + thrill and bruit    OP Dialysis Orders:  [facility] on TTS schedule. 3:30 hours, EDW 35kg, AVF, BFR 350/DFR 500, 2K/2.25Ca bath - Heparin 1500 units IV bolus q HD - Mircera q 4 weeks - Hectoral IV q HD  :Problem/Plan 1.  Hypoglycemia: resolved 2. .Possible UTI/ hypothermia: on abx 3.  ESRD:  HD today, active consideration of HD withdrawal, awaiting pall care consult 4.  Hypertension/volume: stable, at dry wt, bp's normal 5.  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  Anemia: Hgb 9.7. Start Aranesp with next HD. 7.  Metabolic bone disease: Corec Ca = 9.1. Continue Renvela with meals and will resume VDRA. Pending hd today Phos level 8. DM Type 2 =per admit  Vinson Moselle MD [facility] pager [phone]   12/12/2015, 9:57 AM    Labs: Basic Metabolic Panel:  Recent Labs Lab 12/08/15 1752 12/09/15 0149 12/10/15 1045  NA 135 131* 129*  K 3.2* 4.4 4.8  CL 98* 92* 92*  CO2 24 27 24   GLUCOSE 125* 141* 172*  BUN 25* 31* 42*  CREATININE 3.09* 3.59* 4.94*  CALCIUM 8.1* 8.1* 9.0  PHOS  --   --  6.7*    Liver Function Tests:  Recent Labs Lab 12/08/15 1752 12/10/15 1045  AST 24  --   ALT 28  --   ALKPHOS 123  --   BILITOT 0.6  --   PROT 7.2  --   ALBUMIN 2.8* 2.5*    Recent Labs Lab 12/08/15 1752  LIPASE 17   No results for input(s): AMMONIA in the last 168 hours. CBC:  Recent Labs Lab 12/08/15 1752 12/09/15 0149 12/10/15 1045  WBC 13.2* 7.8 8.9  NEUTROABS 12.1*  --   --   HGB 11.4* 9.7* 9.6*  HCT 34.4* 29.5* 29.3*  MCV 84.1 82.2 83.2  PLT 139* 148* 189   Cardiac Enzymes: No results for input(s): CKTOTAL, CKMB, CKMBINDEX, TROPONINI in the last 168 hours. CBG:  Recent Labs Lab 12/10/15 2125 12/11/15 0724 12/11/15 1120 12/11/15 1616 12/11/15 2046  GLUCAP 162* 99 178* 168* 151*    Studies/Results: No results found. Medications:   . aspirin EC  81 mg Oral Daily  . ceFEPime (MAXIPIME) IV  1 g Intravenous Daily  . darbepoetin (ARANESP) injection - DIALYSIS  60 mcg Intravenous Q Thu-HD  . docusate sodium  100 mg Oral BID  . doxercalciferol      .  doxercalciferol  4 mcg Intravenous Q T,Th,Sa-HD  . enoxaparin (LOVENOX) injection  20 mg Subcutaneous QHS  . feeding supplement (NEPRO CARB STEADY)  237 mL Oral BID BM  . fluticasone  1 spray Each Nare BID  . lidocaine-prilocaine  1 application Topical Q T,Th,Sa-HD  . multivitamin  1 tablet Oral QHS  . pantoprazole  40 mg Oral Daily  . pravastatin  20 mg Oral QPM  . senna  1 tablet Oral BID  . sevelamer carbonate  1,600 mg Oral TID WC  . ascorbic acid  500 mg Oral BID

## 2015-12-12 NOTE — Progress Notes (Signed)
PT Cancellation Note  Patient Details Name: Elizabeth [name] MRN: [id number] DOB: 06-Jul-1945   Cancelled Treatment:    Reason Eval/Treat Not Completed: Patient at procedure or test/unavailable. Will check back.   Kinda Pottle LUBECK 12/12/2015, 9:53 AM

## 2015-12-12 NOTE — Progress Notes (Signed)
PT Cancellation Note & discharge  Patient Details Name: Elizabeth [name] MRN: [id number] DOB: 15-Jul-1945   Cancelled Treatment:    Reason Eval/Treat Not Completed: Patient declined, no reason specified. Pt making no eye contact with PT and states she does not want therapy while in hospital.  Per notes, no decision has been made about d/c plans.  Will d/c PT order at this time.  If PT evaluation needs to occur based on d/c disposition, then please re-order at this time.   Chaz Ronning LUBECK 12/12/2015, 12:33 PM

## 2015-12-12 NOTE — Progress Notes (Signed)
PROGRESS NOTE        PATIENT DETAILS Name: Elizabeth [name] Age: 70 y.o. Sex: female Date of Birth: [birth year] Admit Date: 12/08/2015 Admitting Physician Jonah Blue, MD ZOX:[id number], Maxine Glenn, DO  Brief Narrative: Patient is a 70 y.o. female resident of a skilled nursing facility, with history of ESRD on hemodialysis, diabetes, left BKA, chronic indwelling Foley catheter admitted for evaluation and treatment of persistent hypoglycemia.  Subjective: Seen at hemodialysis earlier this morning-she seems to be leaning towards stopping dialysis and going to residential hospice. She thinks she wants to be closer to her granddaughter who lives in [place].  No chest pain No shortness of breath   Assessment/Plan: Principal Problem: Hypoglycemia: Resolved. No longer on D5 infusion. Continue to monitor off all insulin products for now, A1c 8.8.I suspect some amount of hypoglycemia is due to her erratic oral intake-she seems very picky about what she eats, she also likely has very brittle diabetes at baseline.  Active Problems: Sepsis likely secondary to catheter related UTI: Mild sepsis pathophysiology evident on admission,  has since resolved. Unfortunately, apparently foley catheter not changed over 1 month-changed on 10/18. Urine culture growing multiple bacterial morphotypes-has completed 5 days of empiric cefepime-suspect does not require any further antimicrobial therapy. Monitor off antibiotics.  History of insulin-dependent diabetes: Admitted with hypoglycemia-apparently on both Lantus and SSI prior to this admission-will plan on resuming sliding scale insulin over the next few days-continue to hold off on Lantus.   ESRD: Nephrology consulted for hemodialysis.  Chronic systolic heart failure (EF 30-35% by TTE on January 2016): Clinically compensated, volume removal with hemodialysis. 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 Reason-? Due to stage IV sacral decubitus ulcer-she still makes urine-have asked RN to change Foley catheter. Defer to patient's PCP whether or not to continue it long-term  Anemia: Suspect secondary to chronic kidney disease- Hemoglobin stable-darbepoetin being dosed by nephrology  History of peripheral vascular disease-status post BKA  Goals of care/ethics: Unfortunate 70 year old female with very brittle diabetes, chronic stage IV sacral decubitus ulcer, ESRD, status post left BKA-admitted with hypoglycemia and UTI. She and her family currently contemplating stopping dialysis-she definitely does not want to go back to skilled nursing facility at this time. She is tired of being in and out of hospitals and is tired of dialysis. Palliative care consulted, I had a discussion with her again today-she is agreeable to a DO NOT RESUSCITATE order. I subsequently spoke with her granddaughter, who is aware and agreeable with the DO NOT RESUSCITATE. Await formal palliative care evaluation, await further discussion with family and patient regarding appropriate disposition.  DVT Prophylaxis: Prophylactic Lovenox   Code Status: DNR  Family Communication: None at bedside-spoke briefly to the grand-daughter-who was on the way to the hospital  Disposition Plan: Remain inpatient- await family discussion/decision regarding home with hospice or residential hospice  Antimicrobial agents: See below  Procedures: None  CONSULTS:  nephrology  Time spent: 25 minutes-Greater than 50% of this time was spent in counseling, explanation of diagnosis, planning of further management, and coordination of care.  MEDICATIONS: Anti-infectives    Start     Dose/Rate Route Frequency Ordered Stop   12/09/15 0600  ceFEPIme (MAXIPIME) 1  g in dextrose 5 % 50 mL  IVPB     1 g 100 mL/hr over 30 Minutes Intravenous Daily 12/08/15 2257     12/08/15 2245  ceFEPIme (MAXIPIME) 2 g in dextrose 5 % 50 mL IVPB  Status:  Discontinued     2 g 100 mL/hr over 30 Minutes Intravenous  Once 12/08/15 2240 12/08/15 2251   12/08/15 1900  cefTRIAXone (ROCEPHIN) 1 g in dextrose 5 % 50 mL IVPB     1 g 100 mL/hr over 30 Minutes Intravenous  Once 12/08/15 1858 12/08/15 2143      Scheduled Meds: . aspirin EC  81 mg Oral Daily  . ceFEPime (MAXIPIME) IV  1 g Intravenous Daily  . darbepoetin (ARANESP) injection - DIALYSIS  60 mcg Intravenous Q Thu-HD  . docusate sodium  100 mg Oral BID  . doxercalciferol  4 mcg Intravenous Q T,Th,Sa-HD  . enoxaparin (LOVENOX) injection  20 mg Subcutaneous QHS  . feeding supplement (NEPRO CARB STEADY)  237 mL Oral BID BM  . fluticasone  1 spray Each Nare BID  . lidocaine-prilocaine  1 application Topical Q T,Th,Sa-HD  . multivitamin  1 tablet Oral QHS  . pantoprazole  40 mg Oral Daily  . pravastatin  20 mg Oral QPM  . senna  1 tablet Oral BID  . sevelamer carbonate  1,600 mg Oral TID WC  . ascorbic acid  500 mg Oral BID   Continuous Infusions:  PRN Meds:.sodium chloride, sodium chloride, acetaminophen **OR** acetaminophen, alteplase, calcium carbonate (dosed in mg elemental calcium), camphor-menthol **AND** hydrOXYzine, docusate sodium, feeding supplement (NEPRO CARB STEADY), heparin, lidocaine (PF), lidocaine-prilocaine, ondansetron **OR** ondansetron (ZOFRAN) IV, oxyCODONE-acetaminophen, pentafluoroprop-tetrafluoroeth, promethazine, sorbitol   PHYSICAL EXAM: Vital signs: Vitals:   12/12/15 1000 12/12/15 1030 12/12/15 1050 12/12/15 1145  BP: (!) 115/54 (!) 113/51 (!) 114/52 (!) 120/52  Pulse: 72 85 75 (!) 105  Resp:   18 16  Temp:   98.2 F (36.8 C) 98.1 F (36.7 C)  TempSrc:   Oral Oral  SpO2:   99% 100%  Weight:      Height:       Filed Weights   12/10/15 2054 12/11/15 2047 12/12/15 0700  Weight: 36.3 kg (80 lb) 36.5  kg (80 lb 7.5 oz) 35.1 kg (77 lb 6.1 oz)   Body mass index is 13.71 kg/m.   General appearance :Awake, alert, not in any distress. Speech Clear. Chronically sick appearing Eyes:, pupils equally reactive to light and accomodation,no scleral icterus.Pink conjunctiva HEENT: Atraumatic and Normocephalic Neck: supple, no JVD. No cervical lymphadenopathy. No thyromegaly Resp:Good air entry bilaterally, no added sounds  CVS: S1 S2 regular, no murmurs.  GI: Bowel sounds present, Non tender and not distended with no gaurding, rigidity or rebound.No organomegaly Extremities: Left BKA-stump without any acute issues Neurology:  speech clear,Non focal Psychiatric: Normal judgment and insight. Alert and oriented x 3. Normal mood. Musculoskeletal:No digital cyanosis Skin:No Rash, warm and dry Wounds: Stage IV sacral ulcer  I have personally reviewed following labs and imaging studies  LABORATORY DATA: CBC:  Recent Labs Lab 12/08/15 1752 12/09/15 0149 12/10/15 1045  WBC 13.2* 7.8 8.9  NEUTROABS 12.1*  --   --   HGB 11.4* 9.7* 9.6*  HCT 34.4* 29.5* 29.3*  MCV 84.1 82.2 83.2  PLT 139* 148* 189    Basic Metabolic Panel:  Recent Labs Lab 12/08/15 1752 12/09/15 0149 12/10/15 1045  NA 135 131* 129*  K 3.2* 4.4 4.8  CL 98* 92*  92*  CO2 24 27 24   GLUCOSE 125* 141* 172*  BUN 25* 31* 42*  CREATININE 3.09* 3.59* 4.94*  CALCIUM 8.1* 8.1* 9.0  PHOS  --   --  6.7*    GFR: Estimated Creatinine Clearance: 5.8 mL/min (by C-G formula based on SCr of 4.94 mg/dL (H)).  Liver Function Tests:  Recent Labs Lab 12/08/15 1752 12/10/15 1045  AST 24  --   ALT 28  --   ALKPHOS 123  --   BILITOT 0.6  --   PROT 7.2  --   ALBUMIN 2.8* 2.5*    Recent Labs Lab 12/08/15 1752  LIPASE 17   No results for input(s): AMMONIA in the last 168 hours.  Coagulation Profile:  Recent Labs Lab 12/08/15 2302  INR 1.21    Cardiac Enzymes: No results for input(s): CKTOTAL, CKMB, CKMBINDEX,  TROPONINI in the last 168 hours.  BNP (last 3 results) No results for input(s): PROBNP in the last 8760 hours.  HbA1C: No results for input(s): HGBA1C in the last 72 hours.  CBG:  Recent Labs Lab 12/11/15 0724 12/11/15 1120 12/11/15 1616 12/11/15 2046 12/12/15 1148  GLUCAP 99 178* 168* 151* 74    Lipid Profile: No results for input(s): CHOL, HDL, LDLCALC, TRIG, CHOLHDL, LDLDIRECT in the last 72 hours.  Thyroid Function Tests: No results for input(s): TSH, T4TOTAL, FREET4, T3FREE, THYROIDAB in the last 72 hours.  Anemia Panel: No results for input(s): VITAMINB12, FOLATE, FERRITIN, TIBC, IRON, RETICCTPCT in the last 72 hours.  Urine analysis:    Component Value Date/Time   COLORURINE RED (A) 12/08/2015 1746   APPEARANCEUR TURBID (A) 12/08/2015 1746   LABSPEC 1.013 12/08/2015 1746   PHURINE 8.5 (H) 12/08/2015 1746   GLUCOSEU NEGATIVE 12/08/2015 1746   HGBUR LARGE (A) 12/08/2015 1746   BILIRUBINUR SMALL (A) 12/08/2015 1746   KETONESUR NEGATIVE 12/08/2015 1746   PROTEINUR >300 (A) 12/08/2015 1746   UROBILINOGEN 0.2 06/25/2014 1436   NITRITE NEGATIVE 12/08/2015 1746   LEUKOCYTESUR LARGE (A) 12/08/2015 1746    Sepsis Labs: Lactic Acid, Venous    Component Value Date/Time   LATICACIDVEN 1.3 12/09/2015 0149    MICROBIOLOGY: Recent Results (from the past 240 hour(s))  Urine culture     Status: Abnormal   Collection Time: 12/08/15  5:46 PM  Result Value Ref Range Status   Specimen Description URINE, RANDOM  Final   Special Requests NONE  Final   Culture MULTIPLE SPECIES PRESENT, SUGGEST RECOLLECTION (A)  Final   Report Status 12/09/2015 FINAL  Final  Blood Culture (routine x 2)     Status: None (Preliminary result)   Collection Time: 12/08/15  7:20 PM  Result Value Ref Range Status   Specimen Description BLOOD RIGHT ARM  Final   Special Requests BOTTLES DRAWN AEROBIC AND ANAEROBIC 5CC  Final   Culture NO GROWTH 4 DAYS  Final   Report Status PENDING  Incomplete   Blood Culture (routine x 2)     Status: None (Preliminary result)   Collection Time: 12/08/15  7:20 PM  Result Value Ref Range Status   Specimen Description BLOOD RIGHT HAND  Final   Special Requests BOTTLES DRAWN AEROBIC AND ANAEROBIC 5CC  Final   Culture NO GROWTH 4 DAYS  Final   Report Status PENDING  Incomplete  MRSA PCR Screening     Status: None   Collection Time: 12/08/15 11:56 PM  Result Value Ref Range Status   MRSA by PCR NEGATIVE NEGATIVE Final  Comment:        The GeneXpert MRSA Assay (FDA approved for NASAL specimens only), is one component of a comprehensive MRSA colonization surveillance program. It is not intended to diagnose MRSA infection nor to guide or monitor treatment for MRSA infections.     RADIOLOGY STUDIES/RESULTS: Dg Chest Port 1 View  Result Date: 12/08/2015 CLINICAL DATA:  Sepsis and hypothermia EXAM: PORTABLE CHEST 1 VIEW COMPARISON:  Chest radiograph 03/09/2015 FINDINGS: There is calcification within the aortic arch. Small bilateral pleural effusions. The right-sided effusion has increased in size. There diffusely increased pulmonary markings throughout both lungs. No focal area of consolidation. No pneumothorax. IMPRESSION: Slightly increased size of right pleural effusion. Unchanged left pleural effusion. No focal airspace consolidation. Electronically Signed   By: Deatra Robinson M.D.   On: 12/08/2015 18:04     LOS: 4 days   Jeoffrey Massed, MD  [facility] Pager:[phone]  If 7PM-7AM, please contact night-coverage [email or web address] Password [email or web address] 12/12/2015, 1:46 PM

## 2015-12-12 NOTE — Consult Note (Signed)
Consultation Note Date: 12/12/2015   Patient Name: Elizabeth [name]  DOB: 06/06/1945  MRN: [id number]  Age / Sex: 70 y.o., female  PCP: Elizabeth BoysMonica Carter, DO Referring Physician: Maretta BeesShanker M Ghimire, MD  Reason for Consultation: Establishing goals of care  HPI/Patient Profile: 70 y.o. female     admitted on 12/08/2015     Clinical Assessment and Goals of Care:  Patient is a 70 year old lady who has been a resident at [facility] nursing facility for 2 years now. She has past medical history significant for diabetes, chronic stage IV sacral decubitus ulcer, end-stage renal disease dependent on dialysis, history of left BKA. Patient is currently admitted to the hospital for management of hypo-glycemia and urinary tract infection. Patient has complained of poor and declining quality of life hence contemplating stopping dialysis. She has complained of feeling tired and not wanting to be discharged back to her skilled nursing facility this time. She has complained of poor quality of life and tired of being in and out of hospitals.  Palliative care consultation for further discussions. Patient is an elderly thin appearing frail lady resting in bed. She is watching college football. Introduce myself and palliative care as follows: Palliative medicine is specialized medical care for people living with serious illness. It focuses on providing relief from the symptoms and stress of a serious illness. The goal is to improve quality of life for both the patient and the family.  Discussed with patient about her acute and underlying chronic medical conditions. Discussed about what happens after patient stopped dialysis. Reassured that hospice provides comfort and aggressive symptom management at end-of-life. All questions answered to the best of my ability. Provided active listening and supportive care. Call placed and discussed with  granddaughter healthcare power of attorney agent Elizabeth RooseveltJasmine [name]. Ms. Elizabeth MeekerMiller lives in [place] but is very much concerned and involved with the patient's care. Patient has a son who lives in [place] daughter-Jasmine's mother who lives in [place]. She states that the patient was not able to manage at home by herself. Even with assistance from home health care. Patient has completed advanced directives in the past whereby she had elected DO NOT RESUSCITATE status and did not want to preserve feeding tube placement. Hence, agreeable to establish DO NOT RESUSCITATE. Offered that a hospice nurse could come in and give additional information about the type of care that can be provided at a residential hospice facility.  Recommendations below. Thank you for the consult.  HCPOA  Granddaughter Elizabeth RooseveltJasmine [name] at [phone], [phone]  SUMMARY OF RECOMMENDATIONS    CODE STATUS now established as DO NOT RESUSCITATE/DO NOT INTUBATE Patient is overall not satisfied with her current quality of life, current skilled nursing facility status-has been at [facility] for over 2 years now. She is strongly considering discontinuation of dialysis and pursuing hospice options. She wishes to further discuss with her granddaughter. Palliative will remain available to facilitate discussions, help with appropriate disposition planning. Code Status/Advance Care Planning:  DNR    Symptom Management:  Palliative Prophylaxis:   Delirium Protocol  Psycho-social/Spiritual:   Desire for further Chaplaincy support:no  Additional Recommendations: Education on Hospice  Prognosis:   Unable to determine  Discharge Planning: To Be Determined      Primary Diagnoses: Present on Admission: . Sepsis (HCC) . Hypoglycemia . Hypothermia . Chronic diastolic congestive heart failure (HCC) . DM (diabetes mellitus) type II controlled with renal manifestation (HCC)   I have reviewed the medical record,  interviewed the patient and family, and examined the patient. The following aspects are pertinent.  Past Medical History:  Diagnosis Date  . Acute hyperkalemia 12/24/2013  . Anemia   . Arthritis of knee, right    "right knee" (06/12/2014)  . Coronary artery disease, non-occlusive January 2016   Moderate p&mLAD & Ostial OM; Myoview Nuclear Stress Test 03/21/2014: EF 35%. Mild hypokinesis of the inferior wall with fixed inferior defect suggesting scar. (no culprit lesion on CATH)  . Critical lower limb ischemia    Status post left BKA following left popliteal and peroneal artery endarterectomy with patch angioplasty.  . Diabetes mellitus, insulin dependent (IDDM), uncontrolled (HCC)    Brittle; has h/o Hypo & Hyperglycemic episodes, Type II  . Diabetic neuropathy (HCC)   . ESRD (end stage renal disease) (HCC)    [address], [place], S (06/12/2014)  . Foley catheter in place    for urinary retention  . Foot infection   . Gout    "was in my LLE"  . Hematemesis April 2016   Hospitalized  . History of CVA (cerebrovascular accident) 08/19/2013   "minor one"  . Hyperlipidemia with target LDL less than 70   . Loss of consciousness (HCC) 08/19/2013  . 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 Urinary retention    DX NEUROGENIC BLADDER--HAS INDWELLING FOLEY CATHETER  . UTI (urinary tract infection) 10/17/2013   Social History   Social History  . Marital status: Single    Spouse name: N/A  . Number of children: N/A  . Years of education: N/A   Occupational History  . disabled    Social History Main Topics  . Smoking status: Never Smoker  . Smokeless tobacco: Never Used  . Alcohol use No     Comment: "stopped drinking in 2013 drank a couple beers/day, 2 days/wk"  . Drug use: No  . Sexual activity: No    Other Topics Concern  . None   Social History Narrative   Granddaughter is Acupuncturist of [facility]    Family History  Problem Relation Age of Onset  . Hyperlipidemia Mother   . Hypertension Mother   . Hodgkin's lymphoma Father   . Heart disease Sister    Scheduled Meds: . aspirin EC  81 mg Oral Daily  . darbepoetin (ARANESP) injection - DIALYSIS  60 mcg Intravenous Q Thu-HD  . docusate sodium  100 mg Oral BID  . doxercalciferol  4 mcg Intravenous Q T,Th,Sa-HD  . enoxaparin (LOVENOX) injection  20 mg Subcutaneous QHS  . feeding supplement (NEPRO CARB STEADY)  237 mL Oral BID BM  . fluticasone  1 spray Each Nare BID  . lidocaine-prilocaine  1 application Topical Q T,Th,Sa-HD  . multivitamin  1 tablet Oral QHS  . pantoprazole  40 mg Oral Daily  . pravastatin  20 mg Oral  QPM  . senna  1 tablet Oral BID  . sevelamer carbonate  1,600 mg Oral TID WC  . ascorbic acid  500 mg Oral BID   Continuous Infusions:  PRN Meds:.sodium chloride, sodium chloride, acetaminophen **OR** acetaminophen, alteplase, calcium carbonate (dosed in mg elemental calcium), camphor-menthol **AND** hydrOXYzine, docusate sodium, feeding supplement (NEPRO CARB STEADY), heparin, lidocaine (PF), lidocaine-prilocaine, ondansetron **OR** ondansetron (ZOFRAN) IV, oxyCODONE-acetaminophen, pentafluoroprop-tetrafluoroeth, promethazine, sorbitol Medications Prior to Admission:  Prior to Admission medications   Medication Sig Start Date End Date Taking? Authorizing Provider  ascorbic acid (VITAMIN C) 500 MG tablet Take 500 mg by mouth 2 (two) times daily.   Yes Historical Provider, MD  aspirin EC 81 MG tablet Take 81 mg by mouth daily.   Yes Historical Provider, MD  cetirizine (ZYRTEC) 10 MG tablet Take 10 mg by mouth daily as needed for allergies.    Yes Historical Provider, MD  fluticasone (FLONASE) 50 MCG/ACT nasal spray Place 1 spray into both nostrils 2 (two) times daily.   Yes Historical Provider,  MD  insulin aspart (NOVOLOG) 100 UNIT/ML injection Inject 0-7 Units into the skin 3 (three) times daily before meals. Per sliding scale: if 0-69 =0 units; 70+ = 7 units, subcutaneously before meals related to DM.   Yes Historical Provider, MD  insulin glargine (LANTUS) 100 UNIT/ML injection Inject 8 Units into the skin every morning.    Yes Historical Provider, MD  lidocaine-prilocaine (EMLA) cream Apply 1 application topically Every Tuesday,Thursday,and Saturday with dialysis.    Yes Historical Provider, MD  Multiple Vitamins-Minerals (DECUBI-VITE) CAPS Take 2 capsules by mouth daily.   Yes Historical Provider, MD  Nutritional Supplements (NUTRITIONAL SUPPLEMENT PO) Take 120 mLs by mouth 3 (three) times daily. Prostat Mech Soft texture   Yes Historical Provider, MD  oxyCODONE-acetaminophen (PERCOCET/ROXICET) 5-325 MG tablet Take 1 tablet by mouth every 8 (eight) hours as needed for severe pain.   Yes Historical Provider, MD  pantoprazole (PROTONIX) 40 MG tablet Take 1 tablet (40 mg total) by mouth daily. 05/13/14  Yes Joseph Art, DO  pravastatin (PRAVACHOL) 20 MG tablet Take 20 mg by mouth every evening.    Yes Historical Provider, MD  promethazine (PHENERGAN) 25 MG tablet Take 25 mg by mouth every 6 (six) hours as needed for nausea or vomiting.   Yes Historical Provider, MD  senna (SENOKOT) 8.6 MG tablet Take 1 tablet by mouth 2 (two) times daily.   Yes Historical Provider, MD  sevelamer carbonate (RENVELA) 800 MG tablet Take 1,600 mg by mouth 3 (three) times daily with meals.   Yes Historical Provider, MD   No Known Allergies Review of Systems  Physical Exam  Vital Signs: BP (!) 120/52 (BP Location: Right Arm)   Pulse (!) 105   Temp 98.1 F (36.7 C) (Oral)   Resp 16   Ht 5\' 3"  (1.6 m)   Wt 35.1 kg (77 lb 6.1 oz)   SpO2 100%   BMI 13.71 kg/m  Pain Assessment: No/denies pain POSS *See Group Information*: 1-Acceptable,Awake and alert Pain Score: 2    SpO2: SpO2: 100 % O2  Device:SpO2: 100 % O2 Flow Rate: .   IO: Intake/output summary:  Intake/Output Summary (Last 24 hours) at 12/12/15 1414 Last data filed at 12/12/15 1401  Gross per 24 hour  Intake              720 ml  Output               75 ml  Net              645 ml    LBM: Last BM Date: 12/10/15 Baseline Weight: Weight: 35.9 kg (79 lb 1.6 oz) Most recent weight: Weight: 35.1 kg (77 lb 6.1 oz)     Palliative Assessment/Data:   Flowsheet Rows   Flowsheet Row Most Recent Value  Intake Tab  Referral Department  Hospitalist  Unit at Time of Referral  Med/Surg Unit  Palliative Care Primary Diagnosis  Nephrology  Palliative Care Type  New Palliative care  Reason for referral  Clarify Goals of Care  Date first seen by Palliative Care  12/12/15  Clinical Assessment  Palliative Performance Scale Score  40%  Pain Max last 24 hours  4  Pain Min Last 24 hours  3  Dyspnea Max Last 24 Hours  4  Dyspnea Min Last 24 hours  3  Nausea Max Last 24 Hours  4  Nausea Min Last 24 Hours  3  Psychosocial & Spiritual Assessment  Palliative Care Outcomes  Patient/Family meeting held?  Yes  Who was at the meeting?  patient, grand daughter       Time In:  63 Time Out:  12 Time Total:  60 min  Greater than 50%  of this time was spent counseling and coordinating care related to the above assessment and plan.  Signed by: Rosalin Hawking, MD  [phone]   Please contact Palliative Medicine Team phone at [phone] for questions and concerns.  For individual provider: See Loretha Stapler

## 2015-12-13 LAB — GLUCOSE, CAPILLARY
GLUCOSE-CAPILLARY: 147 mg/dL — AB (ref 65–99)
GLUCOSE-CAPILLARY: 114 mg/dL — AB (ref 65–99)
Glucose-Capillary: 78 mg/dL (ref 65–99)
Glucose-Capillary: 190 mg/dL — ABNORMAL HIGH (ref 65–99)

## 2015-12-13 LAB — CULTURE, BLOOD (ROUTINE X 2)
CULTURE: NO GROWTH
CULTURE: NO GROWTH

## 2015-12-13 NOTE — Progress Notes (Signed)
Subjective:  No cos this am eating breakfast/ note yest  Pt . And granddaughter= healthcare power of attorney agent, Elizabeth [name]. Ms. Elizabeth [name] lives in [place] had discussion with Palliative care( Granddaughter per telephone )= Pt is now  DNR/  And spoke about residential hospice facility. However Pt.  this am tells me she wishes to continue HD.  Objective Vital signs in last 24 hours: Vitals:   12/12/15 1145 12/12/15 1746 12/12/15 2120 12/13/15 0700  BP: (!) 120/52 (!) 116/56 (!) 103/41 (!) 114/54  Pulse: (!) 105 (!) 104 90 91  Resp: 16 14 16 16   Temp: 98.1 F (36.7 C) 97.7 F (36.5 C) 98.1 F (36.7 C) 99.1 F (37.3 C)  TempSrc: Oral Oral Oral Oral  SpO2: 100% 100% 99% 99%  Weight:      Height:       Weight change:   Physical Exam: General:Thin /Frail, elderly female, NAD . Baseline MS Lungs: CTA bilat Breathing is unlabored. Heart:RRR; no murmur or rub Abdomen:  BS +,Soft, non-tender, non-distended Lower extremities: No LE edema, L BKA./R 3rd 4th toes with dry black skin  Dialysis Access: LUA  AVF + bruit    OP Dialysis Orders:  [name] schedule. 3:30 hours, EDW 35kg, AVF, BFR 350/DFR 500, 2K/2.25Ca bath - Heparin 1500 units IV bolus q HD - Mircera 75mcg q 4 weeks - Hectoral 4mcg IV q HD   Problem/Plan 1. Hypoglycemia: resolved 2. .Possible UTI/ hypothermia: has completed 5 days abx/ chronic Indwelling Foley  With ho Neurogenic bladder/ bka  Decubitus ulcer   3. ESRD: HD on schedule TTS/has been considerating withdrawal  of HD , this am tells me she wishes to continue /  Pall..care consulting  4. Hypertension/volume: stable, no uf  yest with little po intake  Is at dry wt, bp's normal 5.  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 Anemia: Hgb 9.6.on  Aranesp 60mcg  q thurs HD. 7. Metabolic bone disease: Corec Ca = 10.2 phos 6.7 .on  Renvela with meals( noted missing several doses) / hold hect with  corrected Ca >10 / fu labs  8. DM Type 2 =per admit   Elizabeth Pastelavid Zeyfang, PA-C [name] [phone] 12/13/2015,8:30 AM  LOS: 5 days    Pt seen, examined and agree w A/P as above.  Elizabeth [name] pager [phone]   12/13/2015, 12:47 PM    Labs: Basic Metabolic Panel:  Recent Labs Lab 12/08/15 1752 12/09/15 0149 12/10/15 1045  NA 135 131* 129*  K 3.2* 4.4 4.8  CL 98* 92* 92*  CO2 24 27 24   GLUCOSE 125* 141* 172*  BUN 25* 31* 42*  CREATININE 3.09* 3.59* 4.94*  CALCIUM 8.1* 8.1* 9.0  PHOS  --   --  6.7*   Liver Function Tests:  Recent Labs Lab 12/08/15 1752 12/10/15 1045  AST 24  --   ALT 28  --   ALKPHOS 123  --   BILITOT 0.6  --   PROT 7.2  --   ALBUMIN 2.8* 2.5*    Recent Labs Lab 12/08/15 1752  LIPASE 17   No results for input(s): AMMONIA in the last 168 hours. CBC:  Recent Labs Lab 12/08/15 1752 12/09/15 0149 12/10/15 1045  WBC 13.2* 7.8 8.9  NEUTROABS 12.1*  --   --   HGB 11.4* 9.7* 9.6*  HCT 34.4* 29.5* 29.3*  MCV 84.1 82.2 83.2  PLT 139* 148* 189   Cardiac Enzymes: No results for input(s): CKTOTAL, CKMB, CKMBINDEX, TROPONINI in the last 168 hours. CBG:  Recent Labs Lab 12/11/15 2046 12/12/15 1148 12/12/15 1714 12/12/15 2112 12/13/15 0752  GLUCAP 151* 74 144* 120* 78    Studies/Results: No results found. Medications:   . aspirin EC  81 mg Oral Daily  . darbepoetin (ARANESP) injection - DIALYSIS  60 mcg Intravenous Q Thu-HD  . docusate sodium  100 mg Oral BID  . doxercalciferol  4 mcg Intravenous Q T,Th,Sa-HD  . enoxaparin (LOVENOX) injection  20 mg Subcutaneous QHS  . feeding supplement (NEPRO CARB STEADY)  237 mL Oral BID BM  . fluticasone  1 spray Each Nare BID  . lidocaine-prilocaine  1 application Topical Q T,Th,Sa-HD  . multivitamin  1 tablet Oral QHS  . pantoprazole  40 mg Oral Daily  . pravastatin  20 mg Oral QPM  . senna  1 tablet Oral BID  . sevelamer carbonate   1,600 mg Oral TID WC  . ascorbic acid  500 mg Oral BID

## 2015-12-13 NOTE — Progress Notes (Signed)
PROGRESS NOTE        PATIENT DETAILS Name: Elizabeth [name] Age: 70 y.o. Sex: female Date of Birth: 07/12/1945 Admit Date: 12/08/2015 Admitting Physician Jonah BlueJennifer Yates, MD VWU:[id number]:Carter, Maxine GlennMonica, DO  Brief Narrative: Patient is a 70 y.o. female resident of a skilled nursing facility, with history of ESRD on hemodialysis, diabetes, left BKA, chronic indwelling Foley catheter admitted for evaluation and treatment of persistent hypoglycemia.  Subjective: No further hypoglycemia-she now has changed her mind and wants to continue hemodialysis. She is agreeable to be discharged back to SNF  No chest pain No shortness of breath   Assessment/Plan: Principal Problem: Hypoglycemia: Resolved. No longer on D5 infusion. Continue to monitor off all insulin products for now, A1c 8.8.I suspect some amount of hypoglycemia is due to her erratic oral intake-she seems very picky about what she eats, she also likely has very brittle diabetes at baseline.  Active Problems: Sepsis likely secondary to catheter related UTI: Mild sepsis pathophysiology evident on admission,  has since resolved. Unfortunately, apparently foley catheter not changed over 1 month-changed on 10/18. Urine culture growing multiple bacterial morphotypes-has completed 5 days of empiric cefepime-suspect does not require any further antimicrobial therapy. Monitor off antibiotics.  History of insulin-dependent diabetes: Admitted with hypoglycemia-apparently on both Lantus and SSI prior to this admission-will plan on resuming sliding scale insulin over the next few days-continue to hold off on Lantus.   ESRD: Nephrology consulted for hemodialysis.  Chronic systolic heart failure (EF 30-35% by TTE on January 2016): Clinically compensated, volume removal with hemodialysis. 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 Reason-? Due to stage IV sacral decubitus ulcer-she still makes urine-have asked RN to change Foley catheter. Defer to patient's PCP whether or not to continue it long-term  Anemia: Suspect secondary to chronic kidney disease- Hemoglobin stable-darbepoetin being dosed by nephrology  History of peripheral vascular disease-status post BKA  Goals of care/ethics: Unfortunate 70 year old female with very brittle diabetes, chronic stage IV sacral decubitus ulcer, ESRD, status post left BKA-admitted with hypoglycemia and UTI. She and her family contemplated stopping dialysis-she is tired of being in and out of hospitals and just start of dialysis. She also contemplated about going to residential hospice, however after much discussion, she now tells me that she does not want to stop dialysis, and would like to go back to skilled nursing facility. She continues to be a DO NOT RESUSCITATE.  DVT Prophylaxis: Prophylactic Lovenox   Code Status: DNR  Family Communication: None at bedside-we will update granddaughter on 10/23  Disposition Plan: Remain inpatient- back to SNF on 10/23  Antimicrobial agents: See below  Procedures: None  CONSULTS:  nephrology  Time spent: 25 minutes-Greater than 50% of this time was spent in counseling, explanation of diagnosis, planning of further management, and coordination of care.  MEDICATIONS: Anti-infectives    Start     Dose/Rate Route Frequency Ordered Stop   12/09/15 0600  ceFEPIme (MAXIPIME) 1 g in dextrose 5 % 50 mL IVPB  Status:  Discontinued     1 g 100 mL/hr over 30 Minutes Intravenous Daily 12/08/15 2257 12/12/15 1353   12/08/15 2245  ceFEPIme (MAXIPIME) 2 g in dextrose 5 % 50 mL IVPB  Status:  Discontinued     2 g 100 mL/hr over 30 Minutes Intravenous  Once 12/08/15 2240 12/08/15 2251   12/08/15 1900  cefTRIAXone (ROCEPHIN) 1 g in dextrose 5 % 50 mL  IVPB     1 g 100 mL/hr over 30 Minutes Intravenous  Once 12/08/15 1858 12/08/15 2143      Scheduled Meds: . aspirin EC  81 mg Oral Daily  . darbepoetin (ARANESP) injection - DIALYSIS  60 mcg Intravenous Q Thu-HD  . docusate sodium  100 mg Oral BID  . doxercalciferol  4 mcg Intravenous Q T,Th,Sa-HD  . enoxaparin (LOVENOX) injection  20 mg Subcutaneous QHS  . feeding supplement (NEPRO CARB STEADY)  237 mL Oral BID BM  . fluticasone  1 spray Each Nare BID  . lidocaine-prilocaine  1 application Topical Q T,Th,Sa-HD  . multivitamin  1 tablet Oral QHS  . pantoprazole  40 mg Oral Daily  . pravastatin  20 mg Oral QPM  . senna  1 tablet Oral BID  . sevelamer carbonate  1,600 mg Oral TID WC  . ascorbic acid  500 mg Oral BID   Continuous Infusions:  PRN Meds:.sodium chloride, sodium chloride, acetaminophen **OR** acetaminophen, alteplase, calcium carbonate (dosed in mg elemental calcium), camphor-menthol **AND** hydrOXYzine, docusate sodium, feeding supplement (NEPRO CARB STEADY), heparin, lidocaine (PF), lidocaine-prilocaine, ondansetron **OR** ondansetron (ZOFRAN) IV, oxyCODONE-acetaminophen, pentafluoroprop-tetrafluoroeth, promethazine, sorbitol   PHYSICAL EXAM: Vital signs: Vitals:   12/12/15 1746 12/12/15 2120 12/13/15 0700 12/13/15 0957  BP: (!) 116/56 (!) 103/41 (!) 114/54 (!) 106/42  Pulse: (!) 104 90 91 89  Resp: 14 16 16 17   Temp: 97.7 F (36.5 C) 98.1 F (36.7 C) 99.1 F (37.3 C) 98.9 F (37.2 C)  TempSrc: Oral Oral Oral Oral  SpO2: 100% 99% 99% 97%  Weight:      Height:       Filed Weights   12/10/15 2054 12/11/15 2047 12/12/15 0700  Weight: 36.3 kg (80 lb) 36.5 kg (80 lb 7.5 oz) 35.1 kg (77 lb 6.1 oz)   Body mass index is 13.71 kg/m.   General appearance :Awake, alert, not in any distress. Speech Clear. Chronically sick appearing Eyes:, pupils equally reactive to light and accomodation,no scleral icterus.Pink conjunctiva HEENT: Atraumatic and Normocephalic Neck:  supple, no JVD. No cervical lymphadenopathy. No thyromegaly Resp:Good air entry bilaterally, no added sounds  CVS: S1 S2 regular, no murmurs.  GI: Bowel sounds present, Non tender and not distended with no gaurding, rigidity or rebound.No organomegaly Extremities: Left BKA-stump without any acute issues Neurology:  speech clear,Non focal Psychiatric: Normal judgment and insight. Alert and oriented x 3. Normal mood. Musculoskeletal:No digital cyanosis Skin:No Rash, warm and dry Wounds: Stage IV sacral ulcer  I have personally reviewed following labs and imaging studies  LABORATORY DATA: CBC:  Recent Labs Lab 12/08/15 1752 12/09/15 0149 12/10/15 1045  WBC 13.2* 7.8 8.9  NEUTROABS 12.1*  --   --   HGB 11.4* 9.7* 9.6*  HCT 34.4* 29.5* 29.3*  MCV 84.1 82.2 83.2  PLT 139* 148* 189    Basic Metabolic Panel:  Recent Labs Lab 12/08/15 1752 12/09/15 0149 12/10/15 1045  NA 135 131* 129*  K 3.2* 4.4 4.8  CL 98* 92* 92*  CO2 24 27 24   GLUCOSE 125* 141* 172*  BUN 25* 31* 42*  CREATININE 3.09* 3.59* 4.94*  CALCIUM 8.1* 8.1* 9.0  PHOS  --   --  6.7*    GFR: Estimated Creatinine Clearance: 5.8 mL/min (by C-G  formula based on SCr of 4.94 mg/dL (H)).  Liver Function Tests:  Recent Labs Lab 12/08/15 1752 12/10/15 1045  AST 24  --   ALT 28  --   ALKPHOS 123  --   BILITOT 0.6  --   PROT 7.2  --   ALBUMIN 2.8* 2.5*    Recent Labs Lab 12/08/15 1752  LIPASE 17   No results for input(s): AMMONIA in the last 168 hours.  Coagulation Profile:  Recent Labs Lab 12/08/15 2302  INR 1.21    Cardiac Enzymes: No results for input(s): CKTOTAL, CKMB, CKMBINDEX, TROPONINI in the last 168 hours.  BNP (last 3 results) No results for input(s): PROBNP in the last 8760 hours.  HbA1C: No results for input(s): HGBA1C in the last 72 hours.  CBG:  Recent Labs Lab 12/12/15 1148 12/12/15 1714 12/12/15 2112 12/13/15 0752 12/13/15 1159  GLUCAP 74 144* 120* 78 190*     Lipid Profile: No results for input(s): CHOL, HDL, LDLCALC, TRIG, CHOLHDL, LDLDIRECT in the last 72 hours.  Thyroid Function Tests: No results for input(s): TSH, T4TOTAL, FREET4, T3FREE, THYROIDAB in the last 72 hours.  Anemia Panel: No results for input(s): VITAMINB12, FOLATE, FERRITIN, TIBC, IRON, RETICCTPCT in the last 72 hours.  Urine analysis:    Component Value Date/Time   COLORURINE RED (A) 12/08/2015 1746   APPEARANCEUR TURBID (A) 12/08/2015 1746   LABSPEC 1.013 12/08/2015 1746   PHURINE 8.5 (H) 12/08/2015 1746   GLUCOSEU NEGATIVE 12/08/2015 1746   HGBUR LARGE (A) 12/08/2015 1746   BILIRUBINUR SMALL (A) 12/08/2015 1746   KETONESUR NEGATIVE 12/08/2015 1746   PROTEINUR >300 (A) 12/08/2015 1746   UROBILINOGEN 0.2 06/25/2014 1436   NITRITE NEGATIVE 12/08/2015 1746   LEUKOCYTESUR LARGE (A) 12/08/2015 1746    Sepsis Labs: Lactic Acid, Venous    Component Value Date/Time   LATICACIDVEN 1.3 12/09/2015 0149    MICROBIOLOGY: Recent Results (from the past 240 hour(s))  Urine culture     Status: Abnormal   Collection Time: 12/08/15  5:46 PM  Result Value Ref Range Status   Specimen Description URINE, RANDOM  Final   Special Requests NONE  Final   Culture MULTIPLE SPECIES PRESENT, SUGGEST RECOLLECTION (A)  Final   Report Status 12/09/2015 FINAL  Final  Blood Culture (routine x 2)     Status: None   Collection Time: 12/08/15  7:20 PM  Result Value Ref Range Status   Specimen Description BLOOD RIGHT ARM  Final   Special Requests BOTTLES DRAWN AEROBIC AND ANAEROBIC 5CC  Final   Culture NO GROWTH 5 DAYS  Final   Report Status 12/13/2015 FINAL  Final  Blood Culture (routine x 2)     Status: None   Collection Time: 12/08/15  7:20 PM  Result Value Ref Range Status   Specimen Description BLOOD RIGHT HAND  Final   Special Requests BOTTLES DRAWN AEROBIC AND ANAEROBIC 5CC  Final   Culture NO GROWTH 5 DAYS  Final   Report Status 12/13/2015 FINAL  Final  MRSA PCR Screening      Status: None   Collection Time: 12/08/15 11:56 PM  Result Value Ref Range Status   MRSA by PCR NEGATIVE NEGATIVE Final    Comment:        The GeneXpert MRSA Assay (FDA approved for NASAL specimens only), is one component of a comprehensive MRSA colonization surveillance program. It is not intended to diagnose MRSA infection nor to guide or monitor treatment for MRSA infections.  RADIOLOGY STUDIES/RESULTS: Dg Chest Port 1 View  Result Date: 12/08/2015 CLINICAL DATA:  Sepsis and hypothermia EXAM: PORTABLE CHEST 1 VIEW COMPARISON:  Chest radiograph 03/09/2015 FINDINGS: There is calcification within the aortic arch. Small bilateral pleural effusions. The right-sided effusion has increased in size. There diffusely increased pulmonary markings throughout both lungs. No focal area of consolidation. No pneumothorax. IMPRESSION: Slightly increased size of right pleural effusion. Unchanged left pleural effusion. No focal airspace consolidation. Electronically Signed   By: Deatra Robinson M.D.   On: 12/08/2015 18:04     LOS: 5 days   Jeoffrey Massed, MD  [facility] Pager:[phone]  If 7PM-7AM, please contact night-coverage [email or web address] Password [email or web address] 12/13/2015, 3:30 PM

## 2015-12-14 LAB — GLUCOSE, CAPILLARY
GLUCOSE-CAPILLARY: 201 mg/dL — AB (ref 65–99)
Glucose-Capillary: 133 mg/dL — ABNORMAL HIGH (ref 65–99)

## 2015-12-14 MED ORDER — DARBEPOETIN ALFA 150 MCG/0.3ML IJ SOSY: 150.0000 ug | PREFILLED_SYRINGE | INTRAMUSCULAR | Status: DC

## 2015-12-14 MED ORDER — OXYCODONE-ACETAMINOPHEN 5-325 MG PO TABS: 1.0000 | ORAL_TABLET | Freq: Three times a day (TID) | ORAL | 0 refills | Status: DC | PRN

## 2015-12-14 MED ORDER — PRO-STAT SUGAR FREE PO LIQD: 30.0000 mL | Freq: Two times a day (BID) | ORAL | Status: DC

## 2015-12-14 MED ORDER — INSULIN ASPART 100 UNIT/ML ~~LOC~~ SOLN: SUBCUTANEOUS | Status: AC

## 2015-12-14 NOTE — Progress Notes (Signed)
[facility] Progress Note  Dialysis Orders:  [facility] on TTS schedule. 3:30 hours, EDW 35kg, AVF, BFR 350/DFR 500, 2K/2.25Ca bath - Heparin 1500 units IV bolus q HD - Mircera 75mcg q 4 weeks - Hectoral 4mcg IV q HD  Assessment/Plan: 1. Hypoglycemia/Type II  DM - hypoglycemia now resolved CBG 133 Per primary Still N , limiting intake,no glycogen stores 2. ?UTI --indwelling Foley cath (also ?reason for this) Completed 5 days empiric cefepime 2. ESRD - cont HD TTS on schedule - orders for tomorrow if still here  3. Anemia - Hgb 9.6  s/p Aranesp 60 mcg on 10/19 ^ ESA 4. Mineral bone disease - Corr Ca 10.6/ P 6.7 - on Renvela but poor PO intake - on VDRA - may need to lower OP dose for ^corr Ca, Lower Ca in bath 5. HTN/volume - BP controlled/  No edema on exam At EDW - For HD tomorrow No UF 6. Nutrition - Albumin 2.5 - poor PO intake during admit, picky eater -on protein supp/renal vitamin  7. 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  approaching EOL  Tomasa Blasegechi Grace Ejigiri PA-C [facility] Pager [phone] 12/14/2015,9:10 AM  LOS: 6 days  I have seen and examined this patient and agree with the plan of care seen, eval, examined, discussed plan for HD.  Concern of ongoing N, and poor intake, high risk for rehosp. .  Treyon Wymore L 12/14/2015, 9:58 AM  Subjective:  Some nausea this morning says it has been ongoing since admit, improving some HD on Sat without issues. Ok for HD tomorrow.   Objective Vitals:   12/13/15 1800 12/13/15 2043 12/14/15 0513 12/14/15 0840  BP: (!) 116/58 (!) 100/52 (!) 98/56 (!) 102/59  Pulse: 93 93 88 84  Resp: 16 16 16 16   Temp: 99.2 F (37.3 C) 98.3 F (36.8 C) 98.6 F (37 C) 98.7 F (37.1 C)  TempSrc: Oral Oral Oral Oral  SpO2: 100% 96% 99% 99%  Weight:  35.3 kg (77 lb 13.2 oz)    Height:       Physical Exam General: Cachectic appearing elderly AAF, NAD  Heart: RRR Gr  2/6 holosys M Lungs: CTAB, breathing unlabored  Abdomen: soft NTNDLiver at CM Extremities: L BKA - no edema Dialysis Access: LUE AVF +thrill   Additional Objective Labs: Basic Metabolic Panel:  Recent Labs Lab 12/08/15 1752 12/09/15 0149 12/10/15 1045  NA 135 131* 129*  K 3.2* 4.4 4.8  CL 98* 92* 92*  CO2 24 27 24   GLUCOSE 125* 141* 172*  BUN 25* 31* 42*  CREATININE 3.09* 3.59* 4.94*  CALCIUM 8.1* 8.1* 9.0  PHOS  --   --  6.7*   Liver Function Tests:  Recent Labs Lab 12/08/15 1752 12/10/15 1045  AST 24  --   ALT 28  --   ALKPHOS 123  --   BILITOT 0.6  --   PROT 7.2  --   ALBUMIN 2.8* 2.5*    Recent Labs Lab 12/08/15 1752  LIPASE 17   CBC:  Recent Labs Lab 12/08/15 1752 12/09/15 0149 12/10/15 1045  WBC 13.2* 7.8 8.9  NEUTROABS 12.1*  --   --   HGB 11.4* 9.7* 9.6*  HCT 34.4* 29.5* 29.3*  MCV 84.1 82.2 83.2  PLT 139* 148* 189   Blood Culture    Component Value Date/Time   SDES BLOOD RIGHT ARM 12/08/2015 1920   SDES BLOOD RIGHT HAND  12/08/2015 1920   SPECREQUEST BOTTLES DRAWN AEROBIC AND ANAEROBIC 5CC 12/08/2015 1920   SPECREQUEST BOTTLES DRAWN AEROBIC AND ANAEROBIC 5CC 12/08/2015 1920   CULT NO GROWTH 5 DAYS 12/08/2015 1920   CULT NO GROWTH 5 DAYS 12/08/2015 1920   REPTSTATUS 12/13/2015 FINAL 12/08/2015 1920   REPTSTATUS 12/13/2015 FINAL 12/08/2015 1920    Cardiac Enzymes: No results for input(s): CKTOTAL, CKMB, CKMBINDEX, TROPONINI in the last 168 hours. CBG:  Recent Labs Lab 12/13/15 0752 12/13/15 1159 12/13/15 1647 12/13/15 2058 12/14/15 0754  GLUCAP 78 190* 147* 114* 133*   Iron Studies: No results for input(s): IRON, TIBC, TRANSFERRIN, FERRITIN in the last 72 hours. Lab Results  Component Value Date   INR 1.21 12/08/2015   INR 1.13 06/25/2014   INR 1.15 06/12/2014   Medications:   . aspirin EC  81 mg Oral Daily  . darbepoetin (ARANESP) injection - DIALYSIS  60 mcg Intravenous Q Thu-HD  . docusate sodium  100 mg Oral BID   . doxercalciferol  4 mcg Intravenous Q T,Th,Sa-HD  . enoxaparin (LOVENOX) injection  20 mg Subcutaneous QHS  . feeding supplement (NEPRO CARB STEADY)  237 mL Oral BID BM  . fluticasone  1 spray Each Nare BID  . lidocaine-prilocaine  1 application Topical Q T,Th,Sa-HD  . multivitamin  1 tablet Oral QHS  . pantoprazole  40 mg Oral Daily  . pravastatin  20 mg Oral QPM  . senna  1 tablet Oral BID  . sevelamer carbonate  1,600 mg Oral TID WC  . ascorbic acid  500 mg Oral BID

## 2015-12-14 NOTE — Progress Notes (Signed)
Pt refused to do Hemodialysis today. She said that My schedule is T.T. S.I will discharge today and I did last Saturday. Charge Nurse Sal notified.

## 2015-12-14 NOTE — Progress Notes (Signed)
Daily Progress Note   Patient Name: Elizabeth [name]       Date: 12/14/2015 DOB: May 03, 1945  Age: 70 y.o. MRN#: [id number] Attending Physician: Maretta Bees, MD Primary Care Physician: Kirt Boys, DO Admit Date: 12/08/2015  Reason for Consultation/Follow-up: Establishing goals of care  Patient is a 70 y.o. female resident of a skilled nursing facility, with history of ESRD on hemodialysis, diabetes, left BKA, chronic indwelling Foley catheter admitted for evaluation and treatment of persistent hypoglycemia.  Subjective:  awake alert resting in bed IV team is in the room, trying to start new IV Patient complains of nausea  Length of Stay: 6  Current Medications: Scheduled Meds:  . aspirin EC  81 mg Oral Daily  . darbepoetin (ARANESP) injection - DIALYSIS  60 mcg Intravenous Q Thu-HD  . docusate sodium  100 mg Oral BID  . doxercalciferol  4 mcg Intravenous Q T,Th,Sa-HD  . enoxaparin (LOVENOX) injection  20 mg Subcutaneous QHS  . feeding supplement (NEPRO CARB STEADY)  237 mL Oral BID BM  . fluticasone  1 spray Each Nare BID  . lidocaine-prilocaine  1 application Topical Q T,Th,Sa-HD  . multivitamin  1 tablet Oral QHS  . pantoprazole  40 mg Oral Daily  . pravastatin  20 mg Oral QPM  . senna  1 tablet Oral BID  . sevelamer carbonate  1,600 mg Oral TID WC  . ascorbic acid  500 mg Oral BID    Continuous Infusions:    PRN Meds: sodium chloride, sodium chloride, acetaminophen **OR** acetaminophen, alteplase, calcium carbonate (dosed in mg elemental calcium), camphor-menthol **AND** hydrOXYzine, docusate sodium, feeding supplement (NEPRO CARB STEADY), heparin, lidocaine (PF), lidocaine-prilocaine, ondansetron **OR** ondansetron (ZOFRAN) IV, oxyCODONE-acetaminophen,  pentafluoroprop-tetrafluoroeth, promethazine, sorbitol  Physical Exam         NAD thin AA lady S1 S2 Clear Abdomen soft No edema Thin muscle wasting evident Awake alert oriented  Vital Signs: BP (!) 102/59 (BP Location: Right Arm)   Pulse 84   Temp 98.7 F (37.1 C) (Oral)   Resp 16   Ht 5\' 3"  (1.6 m)   Wt 35.3 kg (77 lb 13.2 oz)   SpO2 99%   BMI 13.79 kg/m  SpO2: SpO2: 99 % O2 Device: O2 Device: Not Delivered O2 Flow Rate:    Intake/output summary:  Intake/Output Summary (Last  24 hours) at 12/14/15 0910 Last data filed at 12/14/15 0700  Gross per 24 hour  Intake              680 ml  Output              250 ml  Net              430 ml   LBM: Last BM Date: 12/13/15 Baseline Weight: Weight: 35.9 kg (79 lb 1.6 oz) Most recent weight: Weight: 35.3 kg (77 lb 13.2 oz)       Palliative Assessment/Data:    Flowsheet Rows   Flowsheet Row Most Recent Value  Intake Tab  Referral Department  Hospitalist  Unit at Time of Referral  Med/Surg Unit  Palliative Care Primary Diagnosis  Nephrology  Palliative Care Type  Return patient Palliative Care  Reason for referral  Clarify Goals of Care, Counsel Regarding Hospice  Date first seen by Palliative Care  12/12/15  Clinical Assessment  Palliative Performance Scale Score  40%  Pain Max last 24 hours  4  Pain Min Last 24 hours  3  Dyspnea Max Last 24 Hours  4  Dyspnea Min Last 24 hours  3  Nausea Max Last 24 Hours  4  Nausea Min Last 24 Hours  3  Psychosocial & Spiritual Assessment  Palliative Care Outcomes  Patient/Family meeting held?  Yes  Who was at the meeting?  patient   Palliative Care Outcomes  Clarified goals of care      Patient Active Problem List   Diagnosis Date Noted  . Palliative care encounter   . Sepsis (HCC) 12/08/2015  . Chronic diastolic congestive heart failure (HCC) 08/06/2015  . Dyslipidemia associated with type 2 diabetes mellitus (HCC) 03/16/2015  . Achalasia of esophagus s/p Heller/Toupet  03/06/2015 03/06/2015  . Venous stenosis of left upper extremity 02/06/2015  . 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 DM (diabetes mellitus) type II controlled with renal manifestation (HCC) 06/30/2014  . S/P BKA (below knee amputation) unilateral (HCC) 05/28/2014  . Peripheral artery disease (HCC) 04/16/2014  . Moderate 3V CAD at cath 03/24/14   . Cardiomyopathy, ischemic   . Neuropathy (HCC) 03/17/2014  . Protein-calorie malnutrition, severe (HCC) 12/27/2013  . ESRD on hemodialysis (HCC) 10/17/2013  . Hypoglycemia 08/19/2013  . Hypothermia 04/24/2012  . Anemia of chronic disease 04/24/2012    Palliative Care Assessment & Plan   Patient Profile:    Assessment:  ESRD on HD DM S/p L BKA Chronic foley, sepsis likely secondary to catheter related UTI in this hospitalization   Recommendations/Plan:  Prefers to go back to SNF and continue with dialysis  Since the patient has been seen by H[facility] and Pall[facility] care of [facility] in this hospitalization, recommend out patient follow up with them after discharge to follow up with patient and to continue goals of care conversations.       Code Status:    Code Status Orders        Start     Ordered   12/12/15 1346  Do not attempt resuscitation (DNR)  Continuous    Question Answer Comment  In the event of cardiac or respiratory ARREST Do not call a "code blue"   In the event of cardiac or respiratory ARREST Do not perform Intubation, CPR, defibrillation or ACLS   In the event of cardiac or respiratory ARREST Use medication by any route, position, wound care, and other measures to  relive pain and suffering. May use oxygen, suction and manual treatment of airway obstruction as needed for comfort.      12/12/15 1345    Code Status History    Date Active Date Inactive Code Status Order ID Comments User Context   12/08/2015 10:40 PM 12/12/2015  1:45 PM Full Code [id number]  Jonah BlueJennifer Yates, MD Inpatient    03/06/2015  6:04 PM 03/13/2015  9:42 PM Full Code [id number]  Karie SodaSteven Gross, MD Inpatient   06/25/2014  5:50 PM 06/28/2014  7:57 PM Full Code [id number]  Osvaldo ShipperGokul Krishnan, MD Inpatient   06/12/2014  9:08 PM 06/16/2014  8:50 PM Full Code [id number]  Rodolph Bonganiel V Thompson, MD Inpatient   05/10/2014 12:59 PM 05/13/2014 11:00 PM Full Code [id number]  Raymond GurneyKimberly A Trinh, PA-C Inpatient   05/09/2014  7:28 PM 05/10/2014 12:59 PM Full Code [id number]  Albertine GratesFang Xu, MD Inpatient   03/26/2014  9:47 PM 03/28/2014  9:25 PM Full Code [id number]  Dara LordsSamantha J Rhyne, PA-C Inpatient   03/24/2014  4:31 PM 03/26/2014  9:47 PM Full Code [id number]  Marykay Lexavid W Harding, MD Inpatient   03/19/2014  2:33 PM 03/24/2014  4:31 PM Full Code [id number]  Sherren Kernsharles E Fields, MD Inpatient   03/17/2014  8:33 PM 03/19/2014  2:33 PM Full Code [id number]  Lynden OxfordPranav Patel, MD ED   12/24/2013  5:51 PM 12/28/2013  8:00 PM Full Code [id number]  Kathlen ModyVijaya Akula, MD Inpatient   10/17/2013 11:23 PM 10/25/2013  8:59 PM Full Code [id number]  Cathren Harshipudeep K Rai, MD Inpatient   08/19/2013  8:53 PM 08/20/2013  9:13 PM Full Code [id number]  Dede QueryNa Li, MD Inpatient   04/24/2012  9:20 PM 04/26/2012  9:18 PM Full Code [id number]  Lars MageAnkit Garg, MD ED    Advance Directive Documentation   Flowsheet Row Most Recent Value  Type of Advance Directive  Healthcare Power of Attorney  Pre-existing out of facility DNR order (yellow form or pink MOST form)  No data  "MOST" Form in Place?  No data       Prognosis:   guarded  Discharge Planning:  Skilled Nursing Facility for rehab with Palliative care service follow-up  Care plan was discussed with  Patient.   Thank you for allowing the Palliative Medicine Team to assist in the care of this patient.   Time In:  830 Time Out: 855 Total Time 25 Prolonged Time Billed  no       Greater than 50%  of this time was spent counseling and coordinating care related to the above assessment and plan.  Rosalin HawkingZeba Correne Lalani, MD [phone]  Please contact Palliative Medicine Team phone at [phone]  for questions and concerns.

## 2015-12-14 NOTE — Progress Notes (Signed)
Patient CBG was 201, MD notified, no coverage given

## 2015-12-14 NOTE — [place] FL2 (Signed)
[place] MEDICAID FL2 LEVEL OF CARE SCREENING TOOL     IDENTIFICATION  Patient Name: Elizabeth [name] Birthdate: 1945-11-06 Sex: female Admission Date (Current Location): 12/08/2015  [facility] and [place] Number:  Producer, television/film/video and Address:  [facility], [address] [place], [place] [address]      Provider Number: [id number]  Attending Physician Name and Address:  Maretta Bees, MD  Relative Name and Phone Number:  Donny Pique April (daughter) - [phone]    Current Level of Care: Hospital Recommended Level of Care: Skilled Nursing Facility Prior Approval Number:    Date Approved/Denied:   PASRR Number: [id number]  Discharge Plan: SNF (with palliative)    Current Diagnoses: Patient Active Problem List   Diagnosis Date Noted  . Palliative care encounter   . Sepsis (HCC) 12/08/2015  . Chronic diastolic congestive heart failure (HCC) 08/06/2015  . Dyslipidemia associated with type 2 diabetes mellitus (HCC) 03/16/2015  . 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 Venous stenosis of left upper extremity 02/06/2015  . 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 DM (diabetes mellitus) type II controlled with renal manifestation (HCC) 06/30/2014  . S/P BKA (below knee amputation) unilateral (HCC) 05/28/2014  . Peripheral artery disease (HCC) 04/16/2014  . Moderate 3V CAD at cath 03/24/14   . Cardiomyopathy, ischemic   . Neuropathy (HCC) 03/17/2014  . Protein-calorie malnutrition, severe (HCC) 12/27/2013  . ESRD on hemodialysis (HCC) 10/17/2013  . Hypoglycemia 08/19/2013  . Hypothermia 04/24/2012  . Anemia of chronic disease 04/24/2012    Orientation RESPIRATION BLADDER Height & Weight     Self, Time, Situation, Place  Normal Continent, Indwelling catheter Weight: 77 lb 13.2 oz (35.3 kg) Height:  5\' 3"  (160 cm)  BEHAVIORAL SYMPTOMS/MOOD NEUROLOGICAL BOWEL NUTRITION STATUS   (None)  (None)  Incontinent Diet (Renal/carb-modified with fluid restriction)  AMBULATORY STATUS COMMUNICATION OF NEEDS Skin   Extensive Assist Verbally PU Stage and Appropriate Care       PU Stage 4 Dressing:  (Left medial sacrum. Silver hydrofiber.)               Personal Care Assistance Level of Assistance  Bathing, Feeding, Dressing Bathing Assistance: Maximum assistance Feeding assistance: Limited assistance Dressing Assistance: Maximum assistance     Functional Limitations Info  Sight, Hearing, Speech Sight Info: Adequate Hearing Info: Adequate Speech Info: Adequate    SPECIAL CARE FACTORS FREQUENCY  Diabetic urine testing                    Contractures Contractures Info: Not present    Additional Factors Info  Code Status, Allergies Code Status Info: DNR Allergies Info: NKDA   Insulin Sliding Scale Info: insulin aspart 0-9 UnitsSubcutaneousTID WC (currently being held effective today)       Current Medications (12/14/2015):  This is the current hospital active medication list Current Facility-Administered Medications  Medication Dose Route Frequency Provider Last Rate Last Dose  . 0.9 %  sodium chloride infusion  100 mL Intravenous PRN Julien Nordmann, PA-C      . 0.9 %  sodium chloride infusion  100 mL Intravenous PRN Julien Nordmann, PA-C      . acetaminophen (TYLENOL) tablet 650 mg  650 mg Oral Q6H PRN Jonah Blue, MD       Or  . acetaminophen (TYLENOL) suppository 650 mg  650 mg Rectal Q6H PRN Jonah Blue, MD      .  alteplase (CATHFLO ACTIVASE) injection 2 mg  2 mg Intracatheter Once PRN Julien NordmannKathryn R Stovall, PA-C      . aspirin EC tablet 81 mg  81 mg Oral Daily Jonah BlueJennifer Yates, MD   81 mg at 12/14/15 1355  . calcium carbonate (dosed in mg elemental calcium) suspension 500 mg of elemental calcium  500 mg of elemental calcium Oral Q6H PRN Jonah BlueJennifer Yates, MD      . camphor-menthol [facility](SARNA) lotion 1 application  1 application Topical Q8H PRN Jonah BlueJennifer Yates, MD    1 application at 12/13/15 1622   And  . hydrOXYzine (ATARAX/VISTARIL) tablet 25 mg  25 mg Oral Q8H PRN Jonah BlueJennifer Yates, MD      . Melene Muller[START ON 12/17/2015] Darbepoetin Alfa (ARANESP) injection 150 mcg  150 mcg Intravenous Q Thu-HD Beryle LatheJames Deterding, MD      . docusate sodium (COLACE) capsule 100 mg  100 mg Oral BID Jonah BlueJennifer Yates, MD      . docusate sodium [facility](ENEMEEZ) enema 283 mg  1 enema Rectal PRN Jonah BlueJennifer Yates, MD      . doxercalciferol (HECTOROL) injection 4 mcg  4 mcg Intravenous Q T,Th,Sa-HD Julien NordmannKathryn R Stovall, PA-C   4 mcg at 12/12/15 1006  . enoxaparin (LOVENOX) injection 20 mg  20 mg Subcutaneous QHS Jonah BlueJennifer Yates, MD      . feeding supplement (NEPRO CARB STEADY) liquid 237 mL  237 mL Oral TID PRN Jonah BlueJennifer Yates, MD      . feeding supplement (NEPRO CARB STEADY) liquid 237 mL  237 mL Oral BID BM Wille CelesteKathryn R Stovall, PA-C   237 mL at 12/14/15 1451  . fluticasone (FLONASE) 50 MCG/ACT nasal spray 1 spray  1 spray Each Nare BID Jonah BlueJennifer Yates, MD      . heparin injection 1,000 Units  1,000 Units Dialysis PRN Julien NordmannKathryn R Stovall, PA-C      . lidocaine (PF) (XYLOCAINE) 1 % injection 5 mL  5 mL Intradermal PRN Julien NordmannKathryn R Stovall, PA-C      . lidocaine-prilocaine (EMLA) cream 1 application  1 application Topical Q T,Th,Sa-HD Jonah BlueJennifer Yates, MD      . lidocaine-prilocaine (EMLA) cream 1 application  1 application Topical PRN Julien NordmannKathryn R Stovall, PA-C      . multivitamin (RENA-VIT) tablet 1 tablet  1 tablet Oral QHS Jonah BlueJennifer Yates, MD      . ondansetron [facility](ZOFRAN) tablet 4 mg  4 mg Oral Q6H PRN Jonah BlueJennifer Yates, MD   4 mg at 12/09/15 1639   Or  . ondansetron (ZOFRAN) injection 4 mg  4 mg Intravenous Q6H PRN Jonah BlueJennifer Yates, MD   4 mg at 12/14/15 0932  . oxyCODONE-acetaminophen (PERCOCET/ROXICET) 5-325 MG per tablet 1 tablet  1 tablet Oral Q8H PRN Jonah BlueJennifer Yates, MD   1 tablet at 12/12/15 0209  . pantoprazole (PROTONIX) EC tablet 40 mg  40 mg Oral Daily Jonah BlueJennifer Yates, MD   40 mg at 12/14/15 1354  .  pentafluoroprop-tetrafluoroeth (GEBAUERS) aerosol 1 application  1 application Topical PRN Julien NordmannKathryn R Stovall, PA-C      . promethazine (PHENERGAN) tablet 25 mg  25 mg Oral Q6H PRN Jonah BlueJennifer Yates, MD   25 mg at 12/12/15 0209  . senna (SENOKOT) tablet 8.6 mg  1 tablet Oral BID Jonah BlueJennifer Yates, MD   8.6 mg at 12/12/15 1128  . sevelamer carbonate (RENVELA) tablet 1,600 mg  1,600 mg Oral TID WC Jonah BlueJennifer Yates, MD   1,600 mg at 12/14/15 1354  . sorbitol 70 % solution 30 mL  30 mL  Oral PRN Jonah Blue, MD      . vitamin C (ASCORBIC ACID) tablet 500 mg  500 mg Oral BID Jonah Blue, MD   500 mg at 12/14/15 1354     Discharge Medications: Please see discharge summary for a list of discharge medications.  Relevant Imaging Results:  Relevant Lab Results:   Additional Information SS#: [id number]  Margarito Liner, LCSW

## 2015-12-14 NOTE — Progress Notes (Signed)
Report given to nurse at SNF, and patient discharged via transport. IV discontinued, and all belongings sent with patient/

## 2015-12-14 NOTE — Clinical Social Work Note (Signed)
Clinical Social Work Assessment  Patient Details  Name: Elizabeth [name] MRN: [id number] Date of Birth: Feb 02, 1946  Date of referral:  12/14/15               Reason for consult:  Facility Placement, Discharge Planning                Permission sought to share information with:  Facility Medical sales representative, Family Supports Permission granted to share information::  Yes, Verbal Permission Granted  Name::     Cammie Sickle  Agency::  [facility]  Relationship::  Granddaughter/legal guardian  Solicitor Information:  [phone]  Housing/Transportation Living arrangements for the past 2 months:  Skilled Building surveyor of Information:  Medical Team, Guardian Patient Interpreter Needed:  None Criminal Activity/Legal Involvement Pertinent to Current Situation/Hospitalization:  No - Comment as needed Significant Relationships:  Other Family Members, Adult Children Lives with:  Facility Resident Do you feel safe going back to the place where you live?  Yes Need for family participation in patient care:  Yes (Comment)  Care giving concerns:  Patient is a long-term resident at [facility].   Social Worker assessment / plan:  Per RN, patient is not fully oriented. CSW called patient's granddaughter/legal guardian. CSW introduced role and explained that discharge planning would be discussed. Patient's granddaughter confirmed that patient is from [facility] and the plan is to return. Patient's granddaughter stated that she is looking into hospice for the patient but patient was adamant that she would return to [facility]. No further concerns. CSW encouraged patient's granddaughter to contact CSW as needed. CSW will continue to follow patient and her family and facilitate discharge back to SNF today.  Employment status:  Retired Health and safety inspector:  Medicare PT Recommendations:  Not assessed at this time Information / Referral to community resources:  Skilled Nursing  Facility  Patient/Family's Response to care:  Patient not fully oriented. Patient's granddaughter/legal guardian agreeable to return to SNF. Patient's family supportive and involved in patient's care. Patient's granddaughter appreciated social work intervention.  Patient/Family's Understanding of and Emotional Response to Diagnosis, Current Treatment, and Prognosis:  Patient not fully oriented. Patient's granddaughter/legal guardian understands the need for return to SNF. Patient's granddaughter appears happy with hospital care.  Emotional Assessment Appearance:  Appears stated age Attitude/Demeanor/Rapport:  Unable to Assess Affect (typically observed):  Unable to Assess Orientation:   (Not documented but per RN, she is not fully oriented.) Alcohol / Substance use:  Never Used Psych involvement (Current and /or in the community):  No (Comment)  Discharge Needs  Concerns to be addressed:  Care Coordination Readmission within the last 30 days:  No Current discharge risk:  Dependent with Mobility Barriers to Discharge:  No Barriers Identified   Margarito Liner, LCSW 12/14/2015, 2:58 PM

## 2015-12-14 NOTE — Clinical Social Work Note (Signed)
CSW facilitated patient discharge including contacting patient family and facility to confirm patient discharge plans. Clinical information faxed to facility and family agreeable with plan. CSW arranged ambulance transport via [facility] to [place] around 4:00 pm. RN to call report prior to discharge ([phone]).  CSW will sign off for now as social work intervention is no longer needed. Please consult us again if new needs arise.  Owenn Rothermel, CSW [phone]   

## 2015-12-14 NOTE — Discharge Summary (Signed)
PATIENT DETAILS Name: Elizabeth [name] Age: 70 y.o. Sex: female Date of Birth: 10-13-1945 MRN: [id number]. Admitting Physician: Jonah Blue, MD WUJ:[id number], [place], [place]  Admit Date: 12/08/2015 Discharge date: 12/14/2015  Recommendations for Outpatient Follow-up:  1. Follow up with PCP in 1 weeks 2. Will need hospice and palliative care of [facility] to follow patient while at SNF 3. Ensure follow-up with her hemodialysis center on Tuesdays Thursdays and Saturdays. 4. Has chronic stage IV sacral decubitus ulcer-continue wound care as previous. 5. Has very brittle diabetes, allow mild permissive hyperglycemia-avoid strict glycemic control-as patient at risk for hypoglycemia. 6. Has chronic indwelling Foley catheter-will need to be changed every month.  Admitted From:  SNF  Disposition: SNF   Home Health: No  Equipment/Devices: None  Discharge Condition: Stable  CODE STATUS: DNR  Diet recommendation:  Heart Healthy / Carb Modified   Brief Summary: See H&P, Labs, Consult and Test reports for all details in brief,Patient is a 70 y.o. female resident of a skilled nursing facility, with history of ESRD on hemodialysis, diabetes, left BKA, chronic indwelling Foley catheter admitted for evaluation and treatment of persistent hypoglycemia  Brief Hospital Course: Hypoglycemia: Resolved. No longer on D5 infusion. Continue to monitor off all insulin products for now, A1c 8.8.I suspect some amount of hypoglycemia is due to her erratic oral intake-she seems very picky about what she eats, she also likely has very brittle diabetes at baseline. Will restart sliding scale regimen on discharge-to start covering with insulin only if sugars are more than 200. This patient has very brittle diabetes-and is at risk for hypoglycemia, hence will need to allow some liberalization of her insulin regimen-and allow some amount of mild permissive hyperglycemia.  Active Problems: Sepsis likely  secondary to catheter related UTI: Mild sepsis pathophysiology evident on admission, has since resolved. Unfortunately, apparently foley catheter not changed over 1 month-changed on 10/18. Urine culture growing multiple bacterial morphotypes-has completed 5 days of empiric cefepime-suspect does not require any further antimicrobial therapy.  History of insulin-dependent diabetes: Admitted with hypoglycemia-apparently on both Lantus and SSI prior to this admission-see above  ESRD: Nephrology consulted for hemodialysis. Ensure follow-up at hemodialysis center-at usual schedule  Chronic systolic heart failure (EF 30-35% by TTE on January 2016): Clinically compensated, volume removal with hemodialysis. 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  Chronic indwelling Foley catheter:? Reason-? Due to stage IV sacral decubitus ulcer-she still makes urine-Foley catheter was changed on 10/18. Please continue to change catheter  monthly.  Defer to patient's PCP whether or not to continue it long-term  Anemia: Suspect secondary to chronic kidney disease- Hemoglobin stable-darbepoetin being dosed by nephrology  History of peripheral vascular disease-status post BKA  Failure to thrive syndrome: Poor overall prognoses-continue nutritional supplementation, will best benefit from having palliative care/hospice follow patient at the skilled nursing facility to continue further delineation of goals of care.  Goals of care/ethics: Unfortunate 70 year old female with very brittle diabetes, chronic stage IV sacral decubitus ulcer, ESRD, status post left BKA-admitted with hypoglycemia and UTI. She and her family contemplated stopping dialysis-she is tired of being in and out of hospitals and just tired of dialysis. She also contemplated about going to  residential hospice, however after much discussion, she now tells me that she does not want to stop dialysis, and would like to go back to skilled nursing facility. She continues  to be a DO NOT RESUSCITATE. She will benefit from continued palliative care/hospice follow up at SNF, she is at high risk of decompensation in the new future with risk for repeat hospitalization. Spoke with granddaughter (POA) on 10/23 over the phone, she is aware of very poor overall prognoses.   Procedures/Studies: None  Discharge Diagnoses:  Principal Problem:   Hypoglycemia Active Problems:   Hypothermia   ESRD on hemodialysis (HCC)   S/P BKA (below knee amputation) unilateral (HCC)   DM (diabetes mellitus) type II controlled with renal manifestation (HCC)   Chronic diastolic congestive heart failure (HCC)   Sepsis (HCC)   Palliative care encounter   Discharge Instructions:  Activity:  As tolerated with Full fall precautions use walker/cane & assistance as needed   Discharge Instructions    Call MD for:  extreme fatigue    Complete by:  As directed    Call MD for:  persistant dizziness or light-headedness    Complete by:  As directed    Diet - low sodium heart healthy    Complete by:  As directed    Diet Carb Modified    Complete by:  As directed    Discharge instructions    Complete by:  As directed    Check CBGs before meals and at bedtime   Increase activity slowly    Complete by:  As directed        Medication List    STOP taking these medications   LANTUS 100 UNIT/ML injection Generic drug:  insulin glargine     TAKE these medications   ascorbic acid 500 MG tablet Commonly known as:  VITAMIN C Take 500 mg by mouth 2 (two) times daily.   aspirin EC 81 MG tablet Take 81 mg by mouth daily.   cetirizine 10 MG tablet Commonly known as:  ZYRTEC Take 10 mg by mouth daily as needed for allergies.   DECUBI-VITE Caps Take 2 capsules by mouth daily.   fluticasone 50 MCG/ACT  nasal spray Commonly known as:  FLONASE Place 1 spray into both nostrils 2 (two) times daily.   insulin aspart 100 UNIT/ML injection Commonly known as:  novoLOG 200-250 give 2 units, 251-300 give 4 units, 301-350 give 6 units, 351-400 give 8 units, More than 400-call M.D. What changed:  how much to take  how to take this  when to take this  additional instructions   lidocaine-prilocaine cream Commonly known as:  EMLA Apply 1 application topically Every Tuesday,Thursday,and Saturday with dialysis.   NUTRITIONAL SUPPLEMENT PO Take 120 mLs by mouth 3 (three) times daily. Prostat Mech Soft texture   oxyCODONE-acetaminophen 5-325 MG tablet Commonly known as:  PERCOCET/ROXICET Take 1 tablet by mouth every 8 (eight) hours as needed for severe pain.   pantoprazole 40 MG tablet Commonly known as:  PROTONIX Take 1 tablet (40 mg total) by mouth daily.   pravastatin 20 MG tablet Commonly known as:  PRAVACHOL Take 20 mg by mouth every evening.   promethazine 25 MG tablet Commonly known as:  PHENERGAN Take 25 mg by mouth every 6 (six) hours as needed for nausea or vomiting.   senna 8.6 MG tablet Commonly known as:  SENOKOT Take 1 tablet by mouth 2 (two) times daily.   sevelamer carbonate 800 MG tablet Commonly known as:  RENVELA Take 1,600 mg by mouth 3 (three) times daily with meals.      Follow-up Information    Kirt BoysCarter, Monica, DO. Schedule an appointment as soon as  possible for a visit in 1 week(s).   Specialty:  Internal Medicine Contact information: [address] [place] [address] [phone]        Hemodialysis center .   Why:  Follow at usual schedule on Tuesdays, Thursdays and Saturdays         No Known Allergies  Consultations:   nephrology and Palliative care  Other Procedures/Studies: Dg Chest Port 1 View  Result Date: 12/08/2015 CLINICAL DATA:  Sepsis and hypothermia EXAM: PORTABLE CHEST 1 VIEW COMPARISON:  Chest radiograph  03/09/2015 FINDINGS: There is calcification within the aortic arch. Small bilateral pleural effusions. The right-sided effusion has increased in size. There diffusely increased pulmonary markings throughout both lungs. No focal area of consolidation. No pneumothorax. IMPRESSION: Slightly increased size of right pleural effusion. Unchanged left pleural effusion. No focal airspace consolidation. Electronically Signed   By: Deatra Robinson M.D.   On: 12/08/2015 18:04      TODAY-DAY OF DISCHARGE:  Subjective:   Elizabeth [name] today has no headache,no chest abdominal pain,no new weakness tingling or numbness, feels much better wants to go home today.   Objective:   Blood pressure (!) 102/59, pulse 84, temperature 98.7 F (37.1 C), temperature source Oral, resp. rate 16, height 5\' 3"  (1.6 m), weight 35.3 kg (77 lb 13.2 oz), SpO2 99 %.  Intake/Output Summary (Last 24 hours) at 12/14/15 1016 Last data filed at 12/14/15 0700  Gross per 24 hour  Intake              440 ml  Output              250 ml  Net              190 ml   Filed Weights   12/11/15 2047 12/12/15 0700 12/13/15 2043  Weight: 36.5 kg (80 lb 7.5 oz) 35.1 kg (77 lb 6.1 oz) 35.3 kg (77 lb 13.2 oz)    Exam: Awake Alert, Oriented *3, No new F.N deficits, Normal affect [place].AT,PERRAL Supple Neck,No JVD, No cervical lymphadenopathy appriciated.  Symmetrical Chest wall movement, Good air movement bilaterally, CTAB RRR,No Gallops,Rubs or new Murmurs, No Parasternal Heave +ve B.Sounds, Abd Soft, Non tender, No organomegaly appriciated, No rebound -guarding or rigidity. No Cyanosis, Clubbing or edema, No new Rash or bruise   PERTINENT RADIOLOGIC STUDIES: Dg Chest Port 1 View  Result Date: 12/08/2015 CLINICAL DATA:  Sepsis and hypothermia EXAM: PORTABLE CHEST 1 VIEW COMPARISON:  Chest radiograph 03/09/2015 FINDINGS: There is calcification within the aortic arch. Small bilateral pleural effusions. The right-sided effusion has  increased in size. There diffusely increased pulmonary markings throughout both lungs. No focal area of consolidation. No pneumothorax. IMPRESSION: Slightly increased size of right pleural effusion. Unchanged left pleural effusion. No focal airspace consolidation. Electronically Signed   By: Deatra Robinson M.D.   On: 12/08/2015 18:04     PERTINENT LAB RESULTS: CBC: No results for input(s): WBC, HGB, HCT, PLT in the last 72 hours. CMET CMP     Component Value Date/Time   NA 129 (L) 12/10/2015 1045   NA 135 (A) 07/10/2015   K 4.8 12/10/2015 1045   CL 92 (L) 12/10/2015 1045   CO2 24 12/10/2015 1045   GLUCOSE 172 (H) 12/10/2015 1045   BUN 42 (H) 12/10/2015 1045   BUN 18 07/10/2015   CREATININE 4.94 (H) 12/10/2015 1045   CALCIUM 9.0 12/10/2015 1045   PROT 7.2 12/08/2015 1752   ALBUMIN 2.5 (L) 12/10/2015 1045  AST 24 12/08/2015 1752   ALT 28 12/08/2015 1752   ALKPHOS 123 12/08/2015 1752   BILITOT 0.6 12/08/2015 1752   GFRNONAA 8 (L) 12/10/2015 1045   GFRAA 9 (L) 12/10/2015 1045    GFR Estimated Creatinine Clearance: 5.8 mL/min (by C-G formula based on SCr of 4.94 mg/dL (H)). No results for input(s): LIPASE, AMYLASE in the last 72 hours. No results for input(s): CKTOTAL, CKMB, CKMBINDEX, TROPONINI in the last 72 hours. Invalid input(s): POCBNP No results for input(s): DDIMER in the last 72 hours. No results for input(s): HGBA1C in the last 72 hours. No results for input(s): CHOL, HDL, LDLCALC, TRIG, CHOLHDL, LDLDIRECT in the last 72 hours. No results for input(s): TSH, T4TOTAL, T3FREE, THYROIDAB in the last 72 hours.  Invalid input(s): FREET3 No results for input(s): VITAMINB12, FOLATE, FERRITIN, TIBC, IRON, RETICCTPCT in the last 72 hours. Coags: No results for input(s): INR in the last 72 hours.  Invalid input(s): PT Microbiology: Recent Results (from the past 240 hour(s))  Urine culture     Status: Abnormal   Collection Time: 12/08/15  5:46 PM  Result Value Ref Range  Status   Specimen Description URINE, RANDOM  Final   Special Requests NONE  Final   Culture MULTIPLE SPECIES PRESENT, SUGGEST RECOLLECTION (A)  Final   Report Status 12/09/2015 FINAL  Final  Blood Culture (routine x 2)     Status: None   Collection Time: 12/08/15  7:20 PM  Result Value Ref Range Status   Specimen Description BLOOD RIGHT ARM  Final   Special Requests BOTTLES DRAWN AEROBIC AND ANAEROBIC 5CC  Final   Culture NO GROWTH 5 DAYS  Final   Report Status 12/13/2015 FINAL  Final  Blood Culture (routine x 2)     Status: None   Collection Time: 12/08/15  7:20 PM  Result Value Ref Range Status   Specimen Description BLOOD RIGHT HAND  Final   Special Requests BOTTLES DRAWN AEROBIC AND ANAEROBIC 5CC  Final   Culture NO GROWTH 5 DAYS  Final   Report Status 12/13/2015 FINAL  Final  MRSA PCR Screening     Status: None   Collection Time: 12/08/15 11:56 PM  Result Value Ref Range Status   MRSA by PCR NEGATIVE NEGATIVE Final    Comment:        The GeneXpert MRSA Assay (FDA approved for NASAL specimens only), is one component of a comprehensive MRSA colonization surveillance program. It is not intended to diagnose MRSA infection nor to guide or monitor treatment for MRSA infections.     FURTHER DISCHARGE INSTRUCTIONS:  Get Medicines reviewed and adjusted: Please take all your medications with you for your next visit with your Primary MD  Laboratory/radiological data: Please request your Primary MD to go over all hospital tests and procedure/radiological results at the follow up, please ask your Primary MD to get all Hospital records sent to his/her office.  In some cases, they will be blood work, cultures and biopsy results pending at the time of your discharge. Please request that your primary care M.D. goes through all the records of your hospital data and follows up on these results.  Also Note the following: If you experience worsening of your admission symptoms, develop  shortness of breath, life threatening emergency, suicidal or homicidal thoughts you must seek medical attention immediately by calling 911 or calling your MD immediately  if symptoms less severe.  You must read complete instructions/literature along with all the possible adverse reactions/side effects  for all the Medicines you take and that have been prescribed to you. Take any new Medicines after you have completely understood and accpet all the possible adverse reactions/side effects.   Do not drive when taking Pain medications or sleeping medications (Benzodaizepines)  Do not take more than prescribed Pain, Sleep and Anxiety Medications. It is not advisable to combine anxiety,sleep and pain medications without talking with your primary care practitioner  Special Instructions: If you have smoked or chewed Tobacco  in the last 2 yrs please stop smoking, stop any regular Alcohol  and or any Recreational drug use.  Wear Seat belts while driving.  Please note: You were cared for by a hospitalist during your hospital stay. Once you are discharged, your primary care physician will handle any further medical issues. Please note that NO REFILLS for any discharge medications will be authorized once you are discharged, as it is imperative that you return to your primary care physician (or establish a relationship with a primary care physician if you do not have one) for your post hospital discharge needs so that they can reassess your need for medications and monitor your lab values.  Total Time spent coordinating discharge including counseling, education and face to face time equals 45 minutes.  SignedJeoffrey Massed 12/14/2015 10:16 AM

## 2015-12-15 ENCOUNTER — Encounter: Payer: Self-pay | Admitting: Internal Medicine

## 2015-12-15 NOTE — Progress Notes (Signed)
Patient ID: Elizabeth [name], female   DOB: 12-May-1945, 70 y.o.   MRN: [id number]    HISTORY AND PHYSICAL   DATE: 12/15/2015  Location:    [place] Room Number: 103 A Place of Service: SNF (31)   Extended Emergency Contact Information Primary Emergency Contact: Gunnar Bulla, [place] Mobile Phone: [phone] Relation: Grandaughter Secondary Emergency Contact: Delphina Cahill [place] Mobile Phone: [phone] Relation: Niece  Advanced Directive information Does patient have an advance directive?: Yes, Type of Advance Directive: Out of facility DNR (pink MOST or yellow form), Does patient want to make changes to advanced directive?: No - Patient declined  Chief Complaint  Patient presents with  . 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 A1c 8.8%. Insulin adjusted and SSI start if BS> 200 to allow mild hyperglycemia due to brittle DM and erratic oral intake. Foley cath changed. Urine cx grew multiple morphs. She rec'd 5 days of empiric cefepime. K+ 3.2 -->4.8; albumin 2.8. She presents to SNF for long term care    Past Medical History:  Diagnosis Date  . Acute hyperkalemia 12/24/2013  . Anemia   . Arthritis of knee, right    "right knee" (06/12/2014)  . Coronary artery disease, non-occlusive January 2016   Moderate p&mLAD & Ostial OM; Myoview Nuclear Stress Test 03/21/2014: EF 35%. Mild hypokinesis of the inferior wall with fixed inferior defect suggesting scar. (no culprit lesion on CATH)  . Critical lower limb ischemia    Status post left BKA following left popliteal and peroneal artery endarterectomy with patch angioplasty.  . Diabetes mellitus, insulin dependent  (IDDM), uncontrolled ([place])    Brittle; has h/o Hypo & Hyperglycemic episodes, Type II  . Diabetic neuropathy ([place])   . ESRD (end stage renal disease) ([place])    [address], [place], [place] (06/12/2014)  . Foley catheter in place    for urinary retention  . Foot infection   . Gout    "was in my LLE"  . Hematemesis April 2016   Hospitalized  . History of CVA (cerebrovascular accident) 08/19/2013   "minor one"  . Hyperlipidemia with target LDL less than 70   . Loss of consciousness ([place]) 08/19/2013  . Peripheral vascular disease of lower extremity with ulceration ([place]) January-March 2016   PV Angiogram 03/19/2014: Bilateral AP and PT artery occlusions, 90% Left peroneal artery with single-vessel Right peroneal and flow  . Urinary retention    DX NEUROGENIC BLADDER--HAS INDWELLING FOLEY CATHETER  . UTI (urinary tract infection) 10/17/2013    Past Surgical History:  Procedure Laterality Date  . ABDOMINAL AORTAGRAM N/A 03/19/2014   Procedure: ABDOMINAL Maxcine Ham;  Surgeon: Elam Dutch, MD;  Location: [facility] CATH LAB;  Service: Cardiovascular;  Laterality: N/A;  . AMPUTATION Left 03/26/2014   Procedure: AMPUTATION SECOND LEFT TOE;  Surgeon: Elam Dutch, MD;  Location: [place];  Service: Vascular;  Laterality: Left;  . AMPUTATION Left 05/10/2014   Procedure: AMPUTATION BELOW KNEE;  Surgeon: Rosetta Posner, MD;  Location: [place];  Service: Vascular;  Laterality: Left;  . ANGIOPLASTY Left 04/29/2013   Procedure: ANGIOPLASTY;  Surgeon: Angelia Mould, MD;  Location: [place] [facility] CATH LAB;  Service: Cardiovascular;  Laterality: Left;  . APPLICATION OF A-CELL OF EXTREMITY N/A 10/29/2014   Procedure: PLACEMENT OF APPLICATION OF A-CELL;  Surgeon: Theodoro Kos, DO;  Location: [place];  Service: Plastics;  Laterality: N/A;  . AV FISTULA PLACEMENT Left 04/28/2014   Procedure: CREATION OF LEFT BRACHIOCEPHALIC  ARTERIOVENOUS (AV) FISTULA CREATION;  Surgeon: Conrad Wartrace, MD;  Location: [place];  Service: Vascular;   Laterality: Left;  . BASCILIC VEIN TRANSPOSITION Left 10/30/2012   Procedure: LEFT 1ST STAGE BASCILIC VEIN TRANSPOSITION;  Surgeon: Angelia Mould, MD;  Location: [place] [facility];  Service: Vascular;  Laterality: Left;  . BASCILIC VEIN TRANSPOSITION Left 02/05/2013   Procedure: BASCILIC VEIN TRANSPOSITION 2ND STAGE;  Surgeon: Angelia Mould, MD;  Location: [place];  Service: Vascular;  Laterality: Left;  . BELOW KNEE LEG AMPUTATION Left   . ENDARTERECTOMY POPLITEAL Left 03/26/2014   Procedure: Left popliteal and peroneal artery endarterectomy with vein patch angioplasty;  Surgeon: Elam Dutch, MD;  Location: [place];  Service: Vascular;  Laterality: Left;  . ESOPHAGEAL MANOMETRY N/A 11/24/2014   Procedure: ESOPHAGEAL MANOMETRY (EM);  Surgeon: Carol Ada, MD;  Location: [facility] ENDOSCOPY;  Service: Endoscopy;  Laterality: N/A;  . ESOPHAGOGASTRODUODENOSCOPY N/A 06/13/2014   Procedure: ESOPHAGOGASTRODUODENOSCOPY (EGD);  Surgeon: Carol Ada, MD;  Location[facility] ENDOSCOPY;  Service: Endoscopy;  Laterality: N/A;  . FISTULOGRAM Left 04/29/2013   Procedure: FISTULOGRAM;  Surgeon: Angelia Mould, MD;  Location: [facility] CATH LAB;  Service: Cardiovascular;  Laterality: Left;  . FISTULOGRAM Left 10/08/2014   Procedure: LEFT ARM FISTULOGRAM;  Surgeon: Conrad Saranac, MD;  Location: [place];  Service: Vascular;  Laterality: Left;  . INCISION AND DRAINAGE OF WOUND N/A 10/29/2014   Procedure: IRRIGATION AND DEBRIDEMENT OF SACRAL WOUND, PLACEMENT OF A-CELL;  Surgeon: Theodoro Kos, DO;  Location: [place];  Service: Plastics;  Laterality: N/A;  . INSERTION OF SUPRAPUBIC CATHETER N/A 05/16/2012   Procedure: INSERTION OF SUPRAPUBIC CATHETER;  Surgeon: Alexis Frock, MD;  Location: [facility];  Service: Urology;  Laterality: N/A;  . LEFT AND RIGHT HEART CATHETERIZATION WITH CORONARY ANGIOGRAM N/A 03/24/2014   Procedure: LEFT AND RIGHT HEART CATHETERIZATION WITH CORONARY ANGIOGRAM;  Surgeon: Leonie Man, MD;  Location: [facility] CATH LAB;   Service: Cardiovascular; Moderate disease in p-mLAD & Ostial OM1; normal right heart pressures. Wedge pressure 29 mmHg. --No lesion to explain inferior defect on Myoview and echocardiogram.   . NM MYOCAR PERF WALL MOTION  04/19/2012/ 02/2014   a) low risk study-EF 44%,mild inferolateral hypkinesis; b) EF 35%. Mild hypokinesis of the inferior wall with fixed inferior defect suggesting scar.  Marland Kitchen RADIOLOGY WITH ANESTHESIA N/A 10/25/2013   Procedure: RADIOLOGY WITH ANESTHESIA;  Surgeon: Medication Radiologist, MD;  Location: [place];  Service: Radiology;  Laterality: N/A;  . TRANSTHORACIC ECHOCARDIOGRAM  03/22/2014   EF 30-35%, mild LVH. Severe inferior hypokinesis. Mild MR.  Marland Kitchen UPPER GI ENDOSCOPY  03/06/2015   Procedure: UPPER GI ENDOSCOPY;  Surgeon: Michael Boston, MD;  Location: [facility];  Service: General;;  . VENOPLASTY Left 10/08/2014   Procedure: LEFT CEPHALIC VEIN VENOPLASTY;  Surgeon: Conrad [place], MD;  Location: [place];  Service: Vascular;  Laterality: Left;    Patient Care Team: Gildardo Cranker, DO as PCP - General (Internal Medicine) Fleet Contras, MD as Consulting Physician (Nephrology) Pixie Casino, MD as Consulting Physician (Cardiology) Carol Ada, MD as Consulting Physician (Gastroenterology) Michael Boston, MD as Consulting Physician (General Surgery) Conrad , MD  as Consulting Physician (Vascular Surgery) Gerlene Fee, NP as Nurse Practitioner (Geriatric Medicine) [facility]  Social History   Social History  . Marital status: Single    Spouse name: N/A  . Number of children: N/A  . Years of education: N/A   Occupational History  . disabled    Social History Main Topics  . Smoking status: Never Smoker  . Smokeless tobacco: Never Used  . Alcohol use No     Comment: "stopped drinking in 2013 drank a couple beers/day, 2 days/wk"  . Drug use: No  . Sexual activity: No   Other Topics Concern  . Not on file   Social  History Narrative   Granddaughter is contact      Resident of [place]      reports that she has never smoked. She has never used smokeless tobacco. She reports that she does not drink alcohol or use drugs.  Family History  Problem Relation Age of Onset  . Hyperlipidemia Mother   . Hypertension Mother   . Hodgkin's lymphoma Father   . Heart disease Sister    Family Status  Relation Status  . Mother Deceased at age 58  . Father Deceased at age 7  . Sister Deceased  . Child Alive  . Child Alive    Immunization History  Administered Date(s) Administered  . Influenza-Unspecified 11/21/2012, 12/22/2013, 11/22/2014  . PPD Test 12/14/2015  . Pneumococcal Polysaccharide-23 10/21/2013    No Known Allergies  Medications: Patient's Medications  New Prescriptions   No medications on file  Previous Medications   ASCORBIC ACID (VITAMIN C) 500 MG TABLET    Take 500 mg by mouth 2 (two) times daily.   ASPIRIN EC 81 MG TABLET    Take 81 mg by mouth daily.   CETIRIZINE (ZYRTEC) 10 MG TABLET    Take 10 mg by mouth daily as needed for allergies.    FLUTICASONE (FLONASE) 50 MCG/ACT NASAL SPRAY    Place 1 spray into both nostrils 2 (two) times daily.   INSULIN ASPART (NOVOLOG) 100 UNIT/ML INJECTION    200-250 give 2 units, 251-300 give 4 units, 301-350 give 6 units, 351-400 give 8 units, More than 400-call M.D.   LIDOCAINE-PRILOCAINE (EMLA) CREAM    Apply 1 application topically Every Tuesday,Thursday,and Saturday with dialysis.    MULTIPLE VITAMINS-MINERALS (DECUBI-VITE) CAPS    Take 2 capsules by mouth daily.   NUTRITIONAL SUPPLEMENTS (NUTRITIONAL SUPPLEMENT PO)    Take 120 mLs by mouth 3 (three) times daily. Prostat Mech Soft texture   OXYCODONE-ACETAMINOPHEN (PERCOCET/ROXICET) 5-325 MG TABLET    Take 1 tablet by mouth every 8 (eight) hours as needed for severe pain.   PANTOPRAZOLE (PROTONIX) 40 MG TABLET    Take 1 tablet (40 mg total) by mouth daily.   PRAVASTATIN  (PRAVACHOL) 20 MG TABLET    Take 20 mg by mouth every evening.    PROMETHAZINE (PHENERGAN) 25 MG TABLET    Take 25 mg by mouth every 6 (six) hours as needed for nausea or vomiting.   SENNA (SENOKOT) 8.6 MG TABLET    Take 1 tablet by mouth 2 (two) times daily.   SEVELAMER CARBONATE (RENVELA) 800 MG TABLET    Take 1,600 mg by mouth 3 (three) times daily with meals.  Modified Medications   No medications on file  Discontinued Medications   No medications on file    Review of Systems  There were no vitals  filed for this visit. There is no height or weight on file to calculate BMI.  Physical Exam   Labs reviewed: Admission on 12/08/2015, Discharged on 12/14/2015  Component Date Value Ref Range Status  . Glucose-Capillary 12/08/2015 186* 65 - 99 mg/dL Final  . WBC 12/08/2015 13.2* 4.0 - 10.5 K/uL Final  . RBC 12/08/2015 4.09  3.87 - 5.11 MIL/uL Final  . Hemoglobin 12/08/2015 11.4* 12.0 - 15.0 g/dL Final  . HCT 12/08/2015 34.4* 36.0 - 46.0 % Final  . MCV 12/08/2015 84.1  78.0 - 100.0 fL Final  . MCH 12/08/2015 27.9  26.0 - 34.0 pg Final  . MCHC 12/08/2015 33.1  30.0 - 36.0 g/dL Final  . RDW 12/08/2015 16.5* 11.5 - 15.5 % Final  . Platelets 12/08/2015 139* 150 - 400 K/uL Final  . Neutrophils Relative % 12/08/2015 91  % Final  . Neutro Abs 12/08/2015 12.1* 1.7 - 7.7 K/uL Final  . Lymphocytes Relative 12/08/2015 3  % Final  . Lymphs Abs 12/08/2015 0.4* 0.7 - 4.0 K/uL Final  . Monocytes Relative 12/08/2015 5  % Final  . Monocytes Absolute 12/08/2015 0.6  0.1 - 1.0 K/uL Final  . Eosinophils Relative 12/08/2015 1  % Final  . Eosinophils Absolute 12/08/2015 0.2  0.0 - 0.7 K/uL Final  . Basophils Relative 12/08/2015 0  % Final  . Basophils Absolute 12/08/2015 0.0  0.0 - 0.1 K/uL Final  . Sodium 12/08/2015 135  135 - 145 mmol/L Final  . Potassium 12/08/2015 3.2* 3.5 - 5.1 mmol/L Final  . Chloride 12/08/2015 98* 101 - 111 mmol/L Final  . CO2 12/08/2015 24  22 - 32 mmol/L Final  . Glucose,  Bld 12/08/2015 125* 65 - 99 mg/dL Final  . BUN 12/08/2015 25* 6 - 20 mg/dL Final  . Creatinine, Ser 12/08/2015 3.09* 0.44 - 1.00 mg/dL Final  . Calcium 12/08/2015 8.1* 8.9 - 10.3 mg/dL Final  . Total Protein 12/08/2015 7.2  6.5 - 8.1 g/dL Final  . Albumin 12/08/2015 2.8* 3.5 - 5.0 g/dL Final  . AST 12/08/2015 24  15 - 41 U/L Final  . ALT 12/08/2015 28  14 - 54 U/L Final  . Alkaline Phosphatase 12/08/2015 123  38 - 126 U/L Final  . Total Bilirubin 12/08/2015 0.6  0.3 - 1.2 mg/dL Final  . GFR calc non Af Amer 12/08/2015 14* >60 mL/min Final  . GFR calc Af Amer 12/08/2015 16* >60 mL/min Final   Comment: (NOTE) The eGFR has been calculated using the CKD EPI equation. This calculation has not been validated in all clinical situations. eGFR's persistently <60 mL/min signify possible Chronic Kidney Disease.   . Anion gap 12/08/2015 13  5 - 15 Final  . Lipase 12/08/2015 17  11 - 51 U/L Final  . Lactic Acid, Venous 12/08/2015 2.38* 0.5 - 1.9 mmol/L Final  . Comment 12/08/2015 NOTIFIED PHYSICIAN   Final  . Specimen Description 12/13/2015 BLOOD RIGHT ARM   Final  . Special Requests 12/13/2015 BOTTLES DRAWN AEROBIC AND ANAEROBIC 5CC   Final  . Culture 12/13/2015 NO GROWTH 5 DAYS   Final  . Report Status 12/13/2015 12/13/2015 FINAL   Final  . Specimen Description 12/13/2015 BLOOD RIGHT HAND   Final  . Special Requests 12/13/2015 BOTTLES DRAWN AEROBIC AND ANAEROBIC 5CC   Final  . Culture 12/13/2015 NO GROWTH 5 DAYS   Final  . Report Status 12/13/2015 12/13/2015 FINAL   Final  . Color, Urine 12/08/2015 RED* YELLOW Final  . APPearance 12/08/2015  TURBID* CLEAR Final  . Specific Gravity, Urine 12/08/2015 1.013  1.005 - 1.030 Final  . pH 12/08/2015 8.5* 5.0 - 8.0 Final  . Glucose, UA 12/08/2015 NEGATIVE  NEGATIVE mg/dL Final  . Hgb urine dipstick 12/08/2015 LARGE* NEGATIVE Final  . Bilirubin Urine 12/08/2015 SMALL* NEGATIVE Final  . Ketones, ur 12/08/2015 NEGATIVE  NEGATIVE mg/dL Final  . Protein,  ur 12/08/2015 >300* NEGATIVE mg/dL Final  . Nitrite 12/08/2015 NEGATIVE  NEGATIVE Final  . Leukocytes, UA 12/08/2015 LARGE* NEGATIVE Final  . Specimen Description 12/09/2015 URINE, RANDOM   Final  . Special Requests 12/09/2015 NONE   Final  . Culture 12/09/2015 MULTIPLE SPECIES PRESENT, SUGGEST RECOLLECTION*  Final  . Report Status 12/09/2015 12/09/2015 FINAL   Final  . Troponin i, poc 12/08/2015 0.03  0.00 - 0.08 ng/mL Final  . Comment 3 12/08/2015          Final   Comment: Due to the release kinetics of cTnI, a negative result within the first hours of the onset of symptoms does not rule out myocardial infarction with certainty. If myocardial infarction is still suspected, repeat the test at appropriate intervals.   . Glucose-Capillary 12/08/2015 124* 65 - 99 mg/dL Final  . Glucose-Capillary 12/08/2015 69  65 - 99 mg/dL Final  . Squamous Epithelial / LPF 12/08/2015 6-30* NONE SEEN Final  . WBC, UA 12/08/2015 TOO NUMEROUS TO COUNT  0 - 5 WBC/hpf Final  . RBC / HPF 12/08/2015 TOO NUMEROUS TO COUNT  0 - 5 RBC/hpf Final  . Bacteria, UA 12/08/2015 MANY* NONE SEEN Final  . Crystals 12/08/2015 TRIPLE PHOSPHATE CRYSTALS* NEGATIVE Final  . Urine-Other 12/08/2015 AMORPHOUS URATES/PHOSPHATES   Final  . Lactic Acid, Venous 12/08/2015 1.74  0.5 - 1.9 mmol/L Final  . Lactic Acid, Venous 12/08/2015 1.9  0.5 - 1.9 mmol/L Final  . Lactic Acid, Venous 12/09/2015 1.3  0.5 - 1.9 mmol/L Final  . Procalcitonin 12/08/2015 7.04  ng/mL Final   Comment:        Interpretation: PCT > 2 ng/mL: Systemic infection (sepsis) is likely, unless other causes are known. (NOTE)         ICU PCT Algorithm               Non ICU PCT Algorithm    ----------------------------     ------------------------------         PCT < 0.25 ng/mL                 PCT < 0.1 ng/mL     Stopping of antibiotics            Stopping of antibiotics       strongly encouraged.               strongly encouraged.     ----------------------------     ------------------------------       PCT level decrease by               PCT < 0.25 ng/mL       >= 80% from peak PCT       OR PCT 0.25 - 0.5 ng/mL          Stopping of antibiotics                                             encouraged.     Stopping of  antibiotics           encouraged.    ----------------------------     ------------------------------       PCT level decrease by              PCT >= 0.25 ng/mL       < 80% from peak PCT        AND PCT >= 0.5 ng/mL            Continuing antibiotics                                                                        encouraged.       Continuing antibiotics            encouraged.    ----------------------------     ------------------------------     PCT level increase compared          PCT > 0.5 ng/mL         with peak PCT AND          PCT >= 0.5 ng/mL             Escalation of antibiotics                                          strongly encouraged.      Escalation of antibiotics        strongly encouraged.   . Prothrombin Time 12/08/2015 15.4* 11.4 - 15.2 seconds Final  . INR 12/08/2015 1.21   Final  . aPTT 12/08/2015 34  24 - 36 seconds Final  . Sodium 12/09/2015 131* 135 - 145 mmol/L Final  . Potassium 12/09/2015 4.4  3.5 - 5.1 mmol/L Final  . Chloride 12/09/2015 92* 101 - 111 mmol/L Final  . CO2 12/09/2015 27  22 - 32 mmol/L Final  . Glucose, Bld 12/09/2015 141* 65 - 99 mg/dL Final  . BUN 12/09/2015 31* 6 - 20 mg/dL Final  . Creatinine, Ser 12/09/2015 3.59* 0.44 - 1.00 mg/dL Final  . Calcium 12/09/2015 8.1* 8.9 - 10.3 mg/dL Final  . GFR calc non Af Amer 12/09/2015 12* >60 mL/min Final  . GFR calc Af Amer 12/09/2015 14* >60 mL/min Final   Comment: (NOTE) The eGFR has been calculated using the CKD EPI equation. This calculation has not been validated in all clinical situations. eGFR's persistently <60 mL/min signify possible Chronic Kidney Disease.   . Anion gap 12/09/2015 12  5 - 15 Final    . WBC 12/09/2015 7.8  4.0 - 10.5 K/uL Final  . RBC 12/09/2015 3.59* 3.87 - 5.11 MIL/uL Final  . Hemoglobin 12/09/2015 9.7* 12.0 - 15.0 g/dL Final  . HCT 12/09/2015 29.5* 36.0 - 46.0 % Final  . MCV 12/09/2015 82.2  78.0 - 100.0 fL Final  . MCH 12/09/2015 27.0  26.0 - 34.0 pg Final  . MCHC 12/09/2015 32.9  30.0 - 36.0 g/dL Final  . RDW 12/09/2015 16.4* 11.5 - 15.5 % Final  . Platelets 12/09/2015 148* 150 - 400 K/uL Final  . Glucose-Capillary 12/08/2015 81  65 - 99 mg/dL Final  . MRSA by  PCR 12/09/2015 NEGATIVE  NEGATIVE Final   Comment:        The GeneXpert MRSA Assay (FDA approved for NASAL specimens only), is one component of a comprehensive MRSA colonization surveillance program. It is not intended to diagnose MRSA infection nor to guide or monitor treatment for MRSA infections.   . Glucose-Capillary 12/09/2015 158* 65 - 99 mg/dL Final  . Comment 1 12/09/2015 Notify RN   Final  . Glucose-Capillary 12/09/2015 208* 65 - 99 mg/dL Final  . Hgb A1c MFr Bld 12/10/2015 8.8* 4.8 - 5.6 % Final   Comment: (NOTE)         Pre-diabetes: 5.7 - 6.4         Diabetes: >6.4         Glycemic control for adults with diabetes: <7.0   . Mean Plasma Glucose 12/10/2015 206  mg/dL Final   Comment: (NOTE) Performed At: [facility] [place], [place] [id number] Lindon Romp MD [id number]   . Glucose-Capillary 12/09/2015 234* 65 - 99 mg/dL Final  . Glucose-Capillary 12/09/2015 232* 65 - 99 mg/dL Final  . Glucose-Capillary 12/09/2015 62* 65 - 99 mg/dL Final  . Glucose-Capillary 12/09/2015 82  65 - 99 mg/dL Final  . Glucose-Capillary 12/09/2015 114* 65 - 99 mg/dL Final  . Comment 1 12/09/2015 Notify RN   Final  . Glucose-Capillary 12/10/2015 70  65 - 99 mg/dL Final  . Glucose-Capillary 12/10/2015 123* 65 - 99 mg/dL Final  . Sodium 12/10/2015 129* 135 - 145 mmol/L Final  . Potassium 12/10/2015 4.8  3.5 - 5.1 mmol/L Final  . Chloride 12/10/2015 92* 101 - 111 mmol/L  Final  . CO2 12/10/2015 24  22 - 32 mmol/L Final  . Glucose, Bld 12/10/2015 172* 65 - 99 mg/dL Final  . BUN 12/10/2015 42* 6 - 20 mg/dL Final  . Creatinine, Ser 12/10/2015 4.94* 0.44 - 1.00 mg/dL Final  . Calcium 12/10/2015 9.0  8.9 - 10.3 mg/dL Final  . Phosphorus 12/10/2015 6.7* 2.5 - 4.6 mg/dL Final  . Albumin 12/10/2015 2.5* 3.5 - 5.0 g/dL Final  . GFR calc non Af Amer 12/10/2015 8* >60 mL/min Final  . GFR calc Af Amer 12/10/2015 9* >60 mL/min Final   Comment: (NOTE) The eGFR has been calculated using the CKD EPI equation. This calculation has not been validated in all clinical situations. eGFR's persistently <60 mL/min signify possible Chronic Kidney Disease.   . Anion gap 12/10/2015 13  5 - 15 Final  . WBC 12/10/2015 8.9  4.0 - 10.5 K/uL Final  . RBC 12/10/2015 3.52* 3.87 - 5.11 MIL/uL Final  . Hemoglobin 12/10/2015 9.6* 12.0 - 15.0 g/dL Final  . HCT 12/10/2015 29.3* 36.0 - 46.0 % Final  . MCV 12/10/2015 83.2  78.0 - 100.0 fL Final  . MCH 12/10/2015 27.3  26.0 - 34.0 pg Final  . MCHC 12/10/2015 32.8  30.0 - 36.0 g/dL Final  . RDW 12/10/2015 16.6* 11.5 - 15.5 % Final  . Platelets 12/10/2015 189  150 - 400 K/uL Final  . Glucose-Capillary 12/10/2015 102* 65 - 99 mg/dL Final  . Glucose-Capillary 12/10/2015 162* 65 - 99 mg/dL Final  . Glucose-Capillary 12/11/2015 99  65 - 99 mg/dL Final  . Glucose-Capillary 12/11/2015 178* 65 - 99 mg/dL Final  . Glucose-Capillary 12/11/2015 168* 65 - 99 mg/dL Final  . Glucose-Capillary 12/11/2015 151* 65 - 99 mg/dL Final  . Glucose-Capillary 12/12/2015 74  65 - 99 mg/dL Final  . Glucose-Capillary 12/12/2015 144* 65 -  99 mg/dL Final  . Glucose-Capillary 12/12/2015 120* 65 - 99 mg/dL Final  . Glucose-Capillary 12/13/2015 78  65 - 99 mg/dL Final  . Glucose-Capillary 12/13/2015 190* 65 - 99 mg/dL Final  . Glucose-Capillary 12/13/2015 147* 65 - 99 mg/dL Final  . Glucose-Capillary 12/13/2015 114* 65 - 99 mg/dL Final  . Glucose-Capillary 12/14/2015  133* 65 - 99 mg/dL Final  . Glucose-Capillary 12/14/2015 201* 65 - 99 mg/dL Final  . Comment 1 12/14/2015 Notify RN   Final  . Comment 2 12/14/2015 Document in Chart   Final    Dg Chest Port 1 View  Result Date: 12/08/2015 CLINICAL DATA:  Sepsis and hypothermia EXAM: PORTABLE CHEST 1 VIEW COMPARISON:  Chest radiograph 03/09/2015 FINDINGS: There is calcification within the aortic arch. Small bilateral pleural effusions. The right-sided effusion has increased in size. There diffusely increased pulmonary markings throughout both lungs. No focal area of consolidation. No pneumothorax. IMPRESSION: Slightly increased size of right pleural effusion. Unchanged left pleural effusion. No focal airspace consolidation. Electronically Signed   By: Ulyses Jarred M.D.   On: 12/08/2015 18:04     Assessment/Plan    Oryn Casanova S. Perlie Gold  [facility] [place], [place] [address] [phone] Cell (Monday-Friday 8 AM - 5 PM) [phone] After 5 PM and follow prompts  This encounter was created in error - please disregard.

## 2015-12-17 ENCOUNTER — Encounter: Payer: Self-pay | Admitting: Adult Health

## 2015-12-17 ENCOUNTER — Non-Acute Institutional Stay (SKILLED_NURSING_FACILITY): Payer: Medicare Other | Admitting: Adult Health

## 2015-12-17 DIAGNOSIS — Z992 Dependence on renal dialysis: Secondary | ICD-10-CM | POA: Diagnosis not present

## 2015-12-17 DIAGNOSIS — E1122 Type 2 diabetes mellitus with diabetic chronic kidney disease: Secondary | ICD-10-CM

## 2015-12-17 DIAGNOSIS — N186 End stage renal disease: Secondary | ICD-10-CM | POA: Diagnosis not present

## 2015-12-17 DIAGNOSIS — I5032 Chronic diastolic (congestive) heart failure: Secondary | ICD-10-CM | POA: Diagnosis not present

## 2015-12-17 DIAGNOSIS — I739 Peripheral vascular disease, unspecified: Secondary | ICD-10-CM | POA: Diagnosis not present

## 2015-12-17 DIAGNOSIS — K220 Achalasia of cardia: Secondary | ICD-10-CM | POA: Diagnosis not present

## 2015-12-17 DIAGNOSIS — E785 Hyperlipidemia, unspecified: Secondary | ICD-10-CM | POA: Diagnosis not present

## 2015-12-17 DIAGNOSIS — L98429 Non-pressure chronic ulcer of back with unspecified severity: Secondary | ICD-10-CM

## 2015-12-17 DIAGNOSIS — E1169 Type 2 diabetes mellitus with other specified complication: Secondary | ICD-10-CM | POA: Diagnosis not present

## 2015-12-17 DIAGNOSIS — I2510 Atherosclerotic heart disease of native coronary artery without angina pectoris: Secondary | ICD-10-CM

## 2015-12-17 DIAGNOSIS — L89154 Pressure ulcer of sacral region, stage 4: Secondary | ICD-10-CM

## 2015-12-17 LAB — BASIC METABOLIC PANEL: Glucose: 207 mg/dL

## 2015-12-17 NOTE — Progress Notes (Signed)
Patient ID: Elizabeth [name], female   DOB: Jun 21, 1945, 70 y.o.   MRN: [id number]    Location:   [facility] Room Number: 103-A Place of Service:  SNF (31)   CODE STATUS: DNR  No Known Allergies  Chief Complaint  Patient presents with  . Hospitalization Follow-up    Hospital follow up    HPI:  She is a long term resident of this facility who was hospitalized after dialysis for hypoglycemia; sepsis from uti. The urine culture grew out multiple bacteria and she was treated empirically for 5 days. She tells me that she is feeling better and is happy to be back. She will decline dialysis at times; she did consider stopping dialysis altogether but decided to continue therapy. She will need a palliative care consult.   Past Medical History:  Diagnosis Date  . Acute hyperkalemia 12/24/2013  . Anemia   . Arthritis of knee, right    "right knee" (06/12/2014)  . Coronary artery disease, non-occlusive January 2016   Moderate p&mLAD & Ostial OM; Myoview Nuclear Stress Test 03/21/2014: EF 35%. Mild hypokinesis of the inferior wall with fixed inferior defect suggesting scar. (no culprit lesion on CATH)  . Critical lower limb ischemia    Status post left BKA following left popliteal and peroneal artery endarterectomy with patch angioplasty.  . Diabetes mellitus, insulin dependent (IDDM), uncontrolled ([place])    Brittle; has h/o Hypo & Hyperglycemic episodes, Type II  . Diabetic neuropathy ([place])   . ESRD (end stage renal disease) ([place])    [address], [place], [place] (06/12/2014)  . Foley catheter in place    for urinary retention  . Foot infection   . Gout    "was in my LLE"  . Hematemesis April 2016   Hospitalized  . History of CVA (cerebrovascular accident) 08/19/2013   "minor one"  . Hyperlipidemia with target LDL less than 70   . Loss of consciousness ([place]) 08/19/2013  . 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 Urinary retention    DX NEUROGENIC BLADDER--HAS INDWELLING FOLEY CATHETER  . UTI (urinary tract infection) 10/17/2013    Past Surgical History:  Procedure Laterality Date  . ABDOMINAL AORTAGRAM N/A 03/19/2014   Procedure: ABDOMINAL Maxcine Ham;  Surgeon: Elam Dutch, MD;  Location: [facility] CATH LAB;  Service: Cardiovascular;  Laterality: N/A;  . AMPUTATION Left 03/26/2014   Procedure: AMPUTATION SECOND LEFT TOE;  Surgeon: Elam Dutch, MD;  Location: [place];  Service: Vascular;  Laterality: Left;  . AMPUTATION Left 05/10/2014   Procedure: AMPUTATION BELOW KNEE;  Surgeon: Rosetta Posner, MD;  Location: [place];  Service: Vascular;  Laterality: Left;  . ANGIOPLASTY Left 04/29/2013   Procedure: ANGIOPLASTY;  Surgeon: Angelia Mould, MD;  Location: [facility] CATH LAB;  Service: Cardiovascular;  Laterality: Left;  . APPLICATION OF A-CELL OF EXTREMITY N/A 10/29/2014   Procedure: PLACEMENT OF APPLICATION OF A-CELL;  Surgeon: Theodoro Kos, DO;  Location: [place];  Service: Plastics;  Laterality: N/A;  . AV FISTULA PLACEMENT Left 04/28/2014   Procedure: CREATION OF LEFT BRACHIOCEPHALIC  ARTERIOVENOUS (AV) FISTULA CREATION;  Surgeon: Conrad [place], MD;  Location: [place];  Service: Vascular;  Laterality: Left;  . BASCILIC VEIN TRANSPOSITION Left 10/30/2012   Procedure: LEFT 1ST STAGE BASCILIC VEIN TRANSPOSITION;  Surgeon: Angelia Mould, MD;  Location: [facility] OR;  Service: Vascular;  Laterality: Left;  . BASCILIC VEIN TRANSPOSITION Left 02/05/2013   Procedure: BASCILIC VEIN TRANSPOSITION 2ND STAGE;  Surgeon: Angelia Mould, MD;  Location: [place];  Service: Vascular;  Laterality: Left;  . BELOW KNEE LEG AMPUTATION Left   . ENDARTERECTOMY POPLITEAL Left 03/26/2014   Procedure: Left popliteal and peroneal artery endarterectomy with vein patch angioplasty;  Surgeon: Elam Dutch, MD;   Location: [place];  Service: Vascular;  Laterality: Left;  . ESOPHAGEAL MANOMETRY N/A 11/24/2014   Procedure: ESOPHAGEAL MANOMETRY (EM);  Surgeon: Carol Ada, MD;  Location: [facility] ENDOSCOPY;  Service: Endoscopy;  Laterality: N/A;  . ESOPHAGOGASTRODUODENOSCOPY N/A 06/13/2014   Procedure: ESOPHAGOGASTRODUODENOSCOPY (EGD);  Surgeon: Carol Ada, MD;  Location: [facility] ENDOSCOPY;  Service: Endoscopy;  Laterality: N/A;  . FISTULOGRAM Left 04/29/2013   Procedure: FISTULOGRAM;  Surgeon: Angelia Mould, MD;  Location: [facility] CATH LAB;  Service: Cardiovascular;  Laterality: Left;  . FISTULOGRAM Left 10/08/2014   Procedure: LEFT ARM FISTULOGRAM;  Surgeon: Conrad Ceiba, MD;  Location: [place];  Service: Vascular;  Laterality: Left;  . INCISION AND DRAINAGE OF WOUND N/A 10/29/2014   Procedure: IRRIGATION AND DEBRIDEMENT OF SACRAL WOUND, PLACEMENT OF A-CELL;  Surgeon: Theodoro Kos, DO;  Location: [place];  Service: Plastics;  Laterality: N/A;  . INSERTION OF SUPRAPUBIC CATHETER N/A 05/16/2012   Procedure: INSERTION OF SUPRAPUBIC CATHETER;  Surgeon: Alexis Frock, MD;  Location: [facility];  Service: Urology;  Laterality: N/A;  . LEFT AND RIGHT HEART CATHETERIZATION WITH CORONARY ANGIOGRAM N/A 03/24/2014   Procedure: LEFT AND RIGHT HEART CATHETERIZATION WITH CORONARY ANGIOGRAM;  Surgeon: Leonie Man, MD;  Location: [facility] CATH LAB;  Service: Cardiovascular; Moderate disease in p-mLAD & Ostial OM1; normal right heart pressures. Wedge pressure 29 mmHg. --No lesion to explain inferior defect on Myoview and echocardiogram.   . NM MYOCAR PERF WALL MOTION  04/19/2012/ 02/2014   a) low risk study-EF 44%,mild inferolateral hypkinesis; b) EF 35%. Mild hypokinesis of the inferior wall with fixed inferior defect suggesting scar.  Marland Kitchen RADIOLOGY WITH ANESTHESIA N/A 10/25/2013   Procedure: RADIOLOGY WITH ANESTHESIA;  Surgeon: Medication Radiologist, MD;  Location: [place];  Service: Radiology;  Laterality: N/A;  . TRANSTHORACIC ECHOCARDIOGRAM   03/22/2014   EF 30-35%, mild LVH. Severe inferior hypokinesis. Mild MR.  Marland Kitchen UPPER GI ENDOSCOPY  03/06/2015   Procedure: UPPER GI ENDOSCOPY;  Surgeon: Michael Boston, MD;  Location: [facility];  Service: General;;  . VENOPLASTY Left 10/08/2014   Procedure: LEFT CEPHALIC VEIN VENOPLASTY;  Surgeon: Conrad [place], MD;  Location: [place];  Service: Vascular;  Laterality: Left;    Social History   Social History  . Marital status: Single    Spouse name: N/A  . Number of children: N/A  . Years of education: N/A   Occupational History  . disabled    Social History Main Topics  . Smoking status: Never Smoker  . Smokeless tobacco: Never Used  . Alcohol use No     Comment: "stopped drinking in 2013 drank a couple beers/day, 2 days/wk"  . Drug use: No  . Sexual activity: No   Other Topics Concern  . Not on file   Social History Narrative   Granddaughter is contact      Resident of [facility]- SNF    Family History  Problem Relation Age of Onset  . Hyperlipidemia Mother   . Hypertension Mother   . Hodgkin's lymphoma Father   . Heart disease  Sister       VITAL SIGNS BP (!) 98/52   Pulse 96   Temp (!) 96.7 F (35.9 C) (Oral)   Resp 18   Ht 5' 3"  (1.6 m)   Wt 77 lb 6 oz (35.1 kg)   SpO2 96%   BMI 13.71 kg/m   Patient's Medications  New Prescriptions   No medications on file  Previous Medications   ASCORBIC ACID (VITAMIN C) 500 MG TABLET    Take 500 mg by mouth 2 (two) times daily.   ASPIRIN EC 81 MG TABLET    Take 81 mg by mouth daily.   CETIRIZINE (ZYRTEC) 10 MG TABLET    Take 10 mg by mouth daily as needed for allergies.    FLUTICASONE (FLONASE) 50 MCG/ACT NASAL SPRAY    Place 1 spray into both nostrils 2 (two) times daily.   INSULIN ASPART (NOVOLOG) 100 UNIT/ML INJECTION    200-250 give 2 units, 251-300 give 4 units, 301-350 give 6 units, 351-400 give 8 units, More than 400-call M.D.   LIDOCAINE-PRILOCAINE (EMLA) CREAM    Apply 1 application topically Every  Tuesday,Thursday,and Saturday with dialysis.    MULTIPLE VITAMINS-MINERALS (DECUBI-VITE) CAPS    Take 2 capsules by mouth daily.   OXYCODONE-ACETAMINOPHEN (PERCOCET/ROXICET) 5-325 MG TABLET    Take 1 tablet by mouth every 8 (eight) hours as needed for severe pain.   PANTOPRAZOLE (PROTONIX) 40 MG TABLET    Take 1 tablet (40 mg total) by mouth daily.   PRAVASTATIN (PRAVACHOL) 20 MG TABLET    Take 20 mg by mouth every evening.    PROMETHAZINE (PHENERGAN) 25 MG TABLET    Take 25 mg by mouth every 6 (six) hours as needed for nausea or vomiting.   SENNA (SENOKOT) 8.6 MG TABLET    Take 1 tablet by mouth 2 (two) times daily.   SEVELAMER CARBONATE (RENVELA) 800 MG TABLET    Take 1,600 mg by mouth 3 (three) times daily with meals.  Modified Medications   No medications on file  Discontinued Medications   NUTRITIONAL SUPPLEMENTS (NUTRITIONAL SUPPLEMENT PO)    Take 120 mLs by mouth 3 (three) times daily. Prostat Mech Soft texture     SIGNIFICANT DIAGNOSTIC EXAMS  03-22-14: 2-d echo: - Left ventricle: The cavity size was normal. Wall thickness was increased in a pattern of mild LVH. Systolic function was moderately to severely reduced. The estimated ejection fraction was in the range of 30% to 35%. Severe hypokinesis of the inferior myocardium. - Mitral valve: There was mild regurgitation. - Left atrium: The atrium was mildly dilated. - Pulmonary arteries: PA peak pressure: 31 mm Hg (S). - Pericardium, extracardiac: A trivial pericardial effusion was identified. There was a left pleural effusion.  03-08-15: barium swallow: 1. Narrow but patent gastroesophageal junction. Contrast does flows into the stomach but a large portion of the contrast remains within the patulous esophagus. Recommend caution in advancing diet. 2. No esophageal leak  03-09-15: kub: Normal small bowel gas pattern. Residual contrast material throughout the colon. No colonic distension. Degenerative changes lumbar spine  03-09-15:  chest x-ray: Enlarged cardiac silhouette. Small bilateral pleural effusions. Airspace opacity in the right cardiophrenic angle, which likely represents focal airspace consolidation.   12-08-15: chest x-ray: Slightly increased size of right pleural effusion. Unchanged left pleural effusion. No focal airspace consolidation.    LABS REVIEWED:   03-06-15: wbc 5.2; hgb 11.2; hct 35.7; mcv 85.0; plt 190; glucose 107; bun 37; creat 3.31; k+ 4.6; na++139  hgb a1c 7.5  03-09-15: urine culture: yeast; blood culture: no growth 03-11-15: stool for c-diff: neg 03-12-15: wbc 5.3; hgb 9.8; hct 31.1; mcv 85.1; plt 144; glucose 144; bun 28; creat 4.17; k+ 4.5; na++130; phos 3.7;  albumin 2.0  03-18-15: glucose 66; bun 22; creat 3.19; k+ 4.5; na++137; alk phos 140; albumin 2.6; ca++ 7.7; chol 130; ldl 60; trig 90; hdl 52  04-29-25: influenza swab: neg  05-25-15: pre-albumin 24  06-12-15: hgb a1c 7.4  07-10-15: wbc 4.5; hgb 11.2; hct 37.1; mcv 89.4; plt 125; glucose 118; bun 18; creat 2.97; k+ 3.7; na++ 135; liver normal albumin 2.9; chol 186; ldl 90; trig 101; hdl 76 12-08-15: wbc 13.2; hgb 11.4; hct 34.4; mcv 84.1; plt 139; glucose 125; bun 25; creat 3.09; k+ 3.2; na++ 135; liver normal albumin 2.8; blood culture: no growth; urine culture: multiple bacteria  12-09-15: wbc 7.8 ;hgb 9.7; hct 29.5; mcv 82.2; plt 148; glucose 141; bun 31; creat 3.59; k+ 4.4 ;na++ 131; hgb a1c 8.8    Review of Systems  Constitutional: Negative for malaise/fatigue.  Respiratory: negative for cough . Negative for shortness of breath.    Cardiovascular: Negative for chest pain, palpitations and leg swelling.  Gastrointestinal: no abdominal pain no constipation  Musculoskeletal: Negative for myalgias, back pain and joint pain.  Skin: Negative.   Neurological: Negative for dizziness.  Psychiatric/Behavioral: The patient is not nervous/anxious.      Physical Exam Constitutional: No distress.  Frail   Eyes: Conjunctivae are normal.    Neck: Neck supple. No JVD present. No thyromegaly present.  Cardiovascular: Normal rate, regular rhythm and intact distal pulses.   Respiratory: Effort normal and breath sounds normal. No respiratory distress. She has no wheezes.  GI: Soft. Bowel sounds are normal. She exhibits no distension. There is no tenderness.    Musculoskeletal: She exhibits no edema.  Able to move all extremities  Left bka Has foley    Lymphadenopathy:    She has no cervical adenopathy.  Neurological: She is alert.  Skin: Skin is warm and dry. She is not diaphoretic.  Has stage IV sacral wound 0.5 x 0.4 x 0.6 cm no signs of infection present; treated with dakins and silver alginate  Psychiatric: She has a normal mood and affect.  Has left upper extremity A/V fistula +thrill + bruit       ASSESSMENT/ PLAN:   1. ESRD: followed by nephrology; is on hemodialysis three days per week. Is on 1200 cc fluid restriction; will continue renvela 1600 mg three times daily will monitor  2. Diabetes; hgb a1c is 8.8 will continue novolog SSI 200-250=2 units; 251-300=4 units; 301-350= 6 units; 351-400= 8 units   3. achalasia of esophagus status post Heller/Toupet on 03-06-15: will continue protonix 40 mg daily; will monitor  4. Dyslipidemia: will continue pravachol 20 mg daily  ldl is 90  5. Anemia of chronic disease: hgb is 9.7; will monitor   6. CAD: has 3 vessel disease is status post cath on 03-24-14: no complaint of chest pain present; will continue asa 81 mg daily   7. Chronic systolic heart failure: is presently stable her EF is 30%-35%; without symptoms of fluid overload present.   8. PAD: will continue asa 81 mg daily is status post left bka  9. Protein calorie malnutrition: will continue supplements per facility protocol her current weight is 77  pounds; her albumin is 2.8  10. Allergic rhinitis: will continue  zyrtec to 10 mg nightly as needed  flonase twice daily   11. Urine retention: has long term foley    12. 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    Will setup a palliative care consult   Time spent with patient 50   minutes >50% time spent counseling; reviewing medical record; tests; labs; and developing future plan of care   MD is aware of resident's narcotic use and is in agreement with current plan of care. We will attempt to wean resident as appropriate.     Ok Edwards NP [facility]  Contact [phone] Monday through Friday 8am- 5pm  After hours call [phone]

## 2015-12-18 ENCOUNTER — Ambulatory Visit: Payer: Medicare Other

## 2015-12-18 ENCOUNTER — Inpatient Hospital Stay: Admission: RE | Admit: 2015-12-18 | Payer: Medicare Other | Source: Ambulatory Visit

## 2015-12-22 IMAGING — CR DG FOOT COMPLETE 3+V*L*
3 series · 3 of 3 positions shown · non-contrast
Comparison: MRI left foot dated 03/17/2014

CLINICAL DATA: Pain/lacerations on left foot

EXAM:
LEFT FOOT - COMPLETE 3+ VIEW

[x foot ap left]
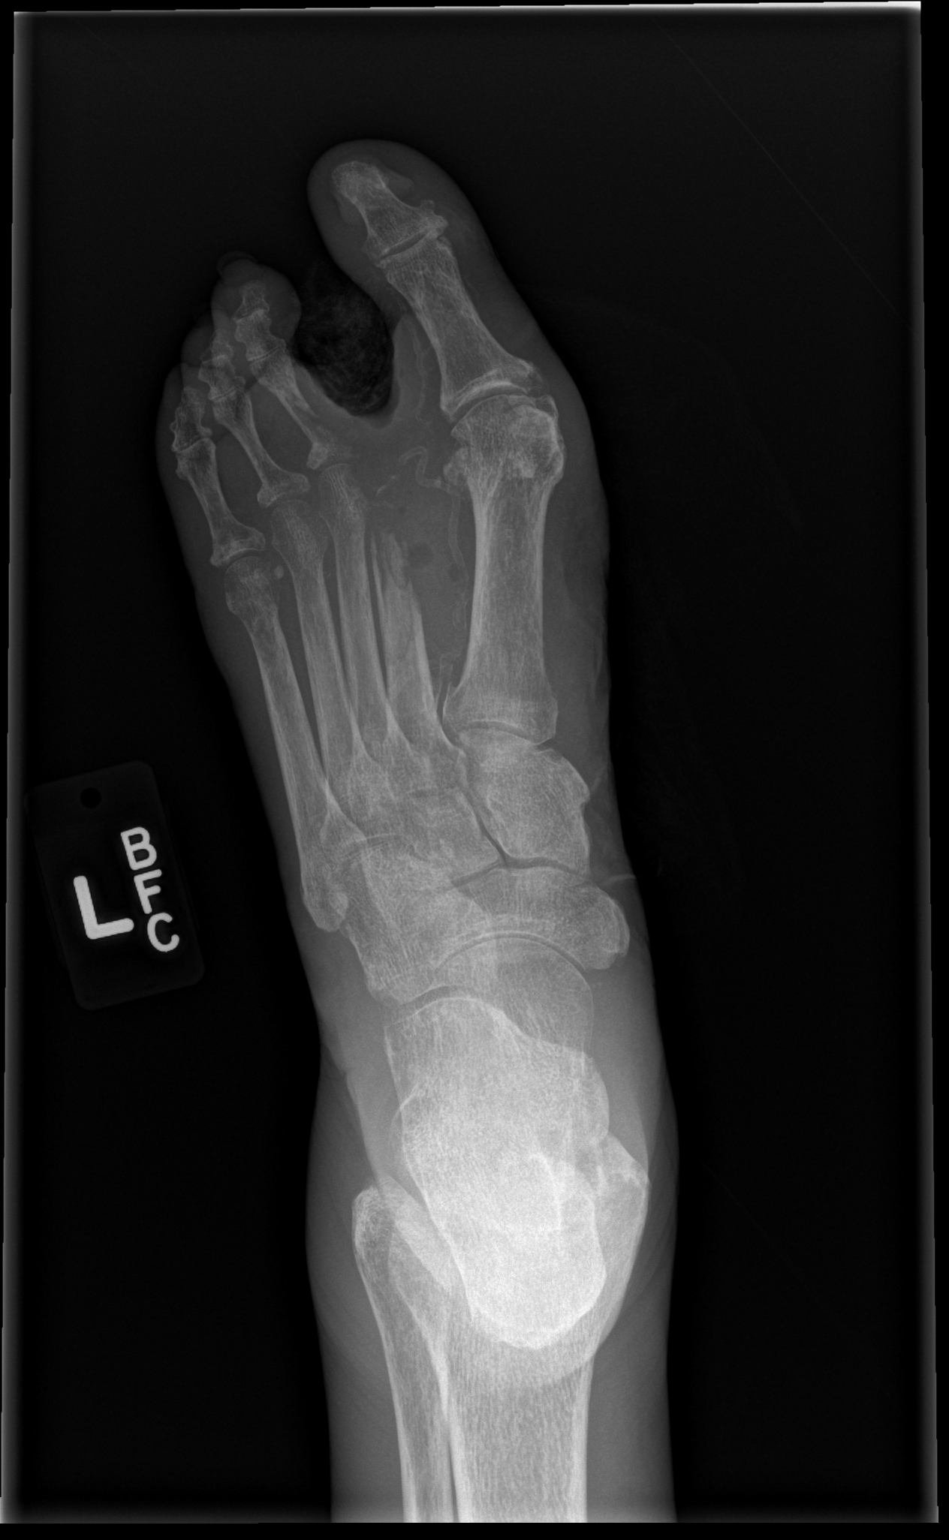

[x foot obl left]
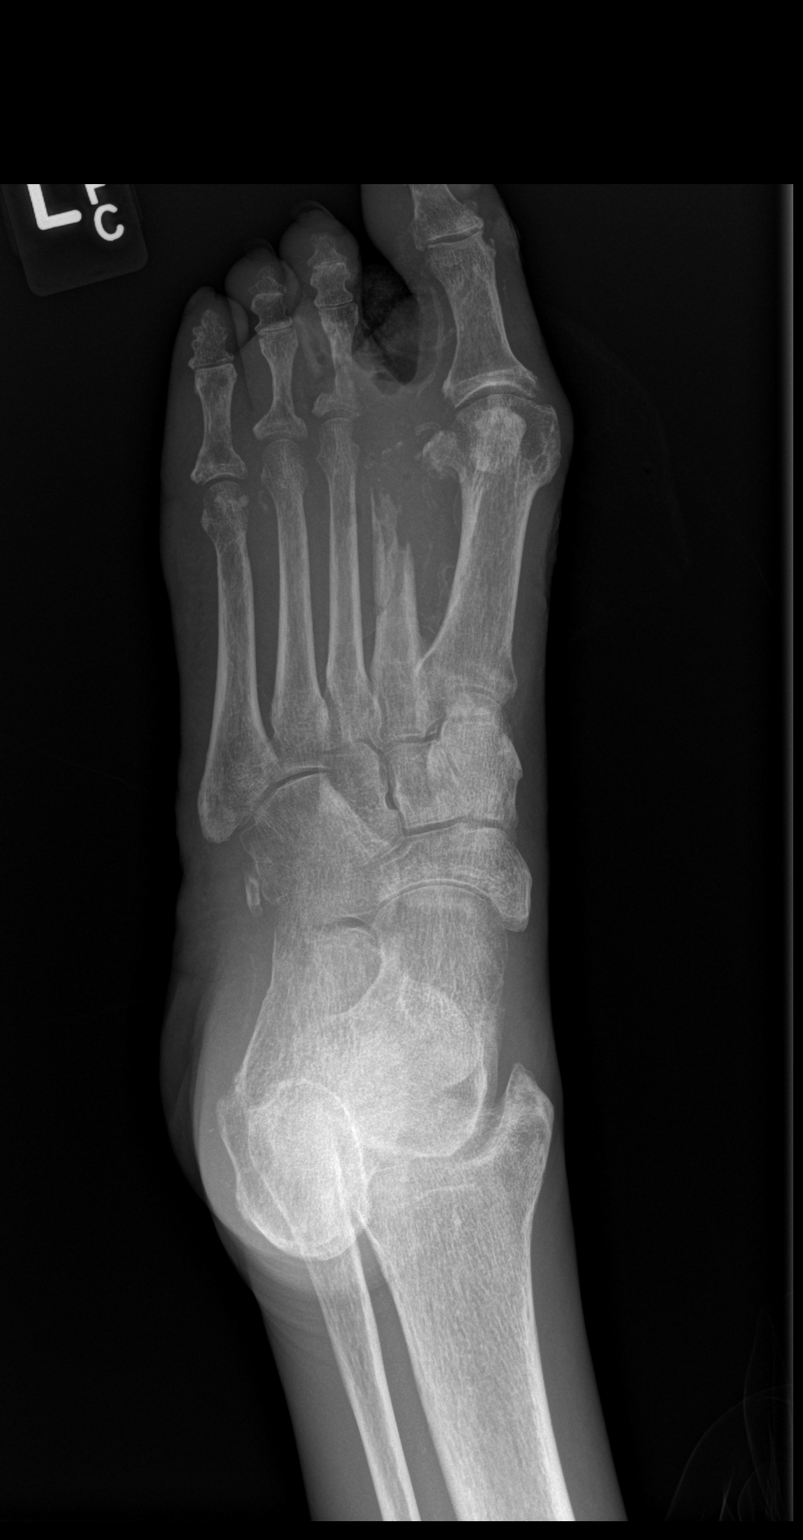

[x foot lat left]
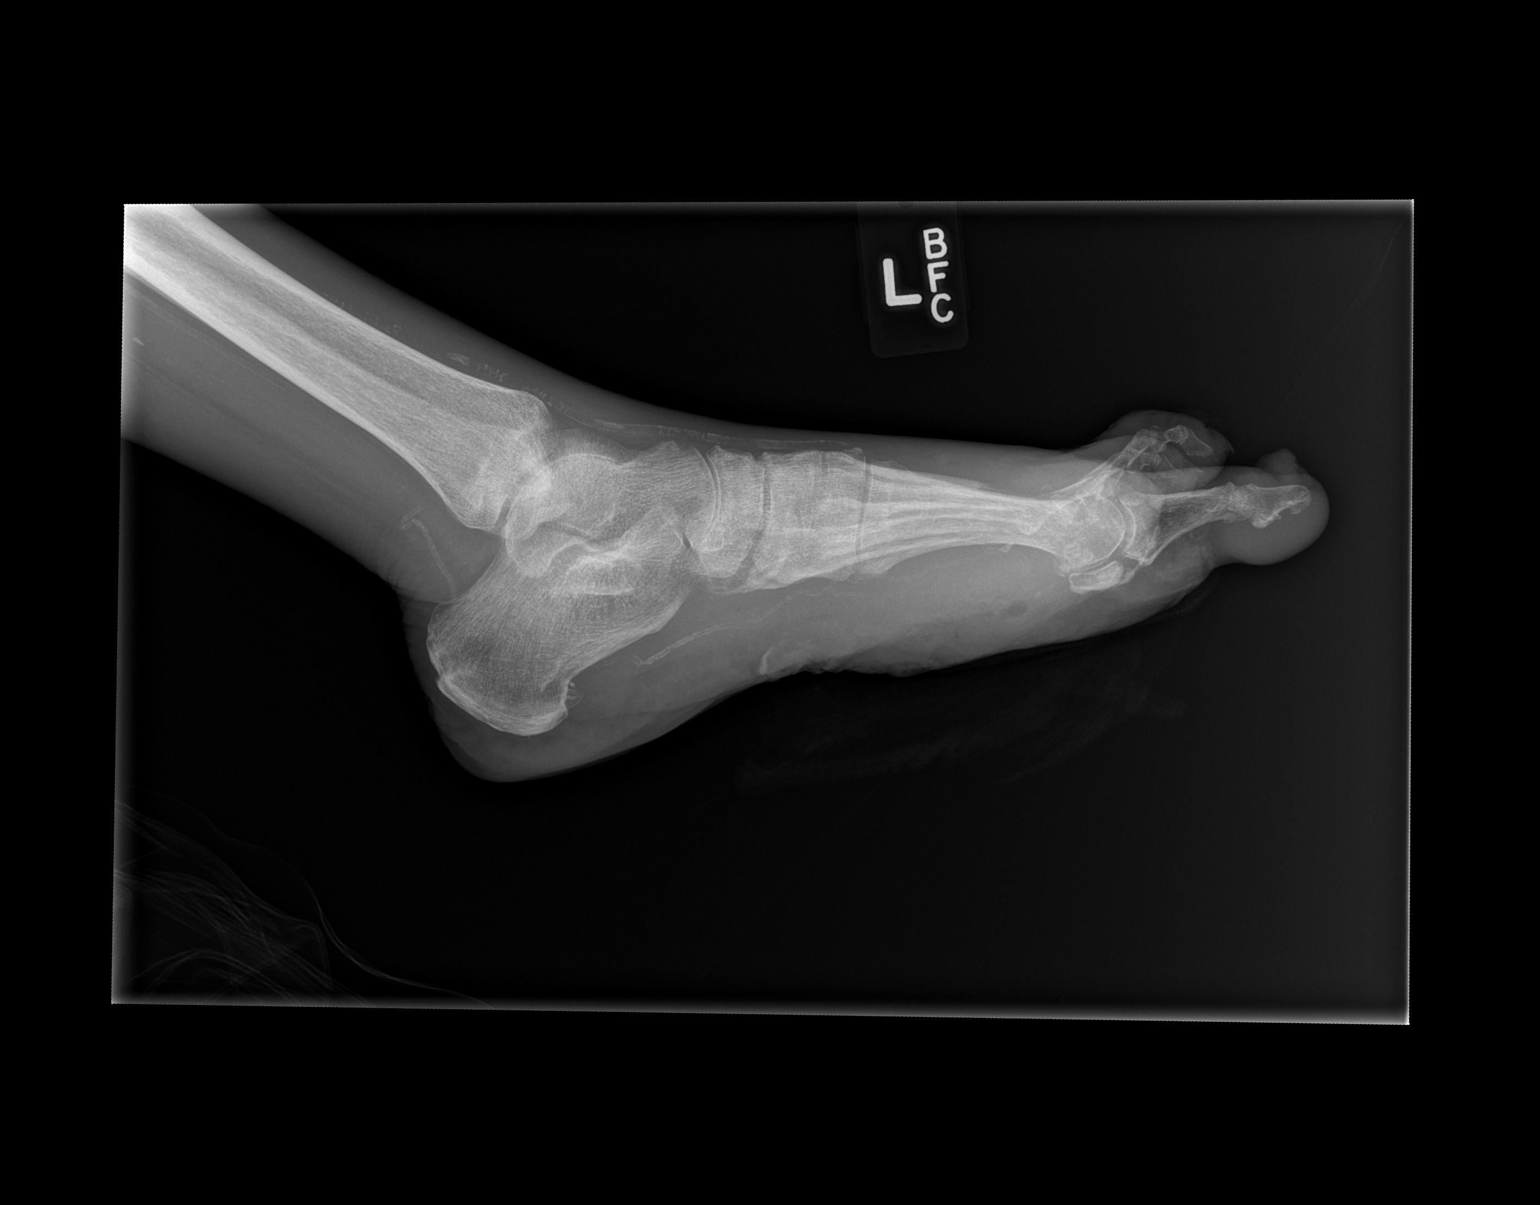

[3 of 3 positions shown; findings below may reference images not displayed]

FINDINGS: Status post mid tarsal amputation of the 2nd digit. Bony sequestrum
involving the residual 2nd metatarsal, indicating residual
osteomyelitis. Associated soft tissue swelling with subcutaneous
gas.

Additional cortical irregularity involving the medial aspect of the
3rd metatarsal head, indicating acute osteomyelitis.

Additional cortical irregularity involving the base/proximal shaft
of the 3rd proximal phalanx. Associated pathologic fracture.
IMPRESSION: Acute osteomyelitis involving in the residual 2nd metatarsal, 3rd
metatarsal head, and 3rd proximal phalanx.

Associated pathologic fracture of the 3rd proximal phalanx.

## 2015-12-29 ENCOUNTER — Non-Acute Institutional Stay (SKILLED_NURSING_FACILITY): Payer: Medicare Other | Admitting: Internal Medicine

## 2015-12-29 ENCOUNTER — Encounter: Payer: Self-pay | Admitting: Internal Medicine

## 2015-12-29 DIAGNOSIS — E1122 Type 2 diabetes mellitus with diabetic chronic kidney disease: Secondary | ICD-10-CM | POA: Diagnosis not present

## 2015-12-29 DIAGNOSIS — I96 Gangrene, not elsewhere classified: Secondary | ICD-10-CM

## 2015-12-29 DIAGNOSIS — E43 Unspecified severe protein-calorie malnutrition: Secondary | ICD-10-CM

## 2015-12-29 DIAGNOSIS — Z992 Dependence on renal dialysis: Secondary | ICD-10-CM | POA: Diagnosis not present

## 2015-12-29 DIAGNOSIS — I739 Peripheral vascular disease, unspecified: Secondary | ICD-10-CM | POA: Diagnosis not present

## 2015-12-29 DIAGNOSIS — I5032 Chronic diastolic (congestive) heart failure: Secondary | ICD-10-CM | POA: Diagnosis not present

## 2015-12-29 DIAGNOSIS — D638 Anemia in other chronic diseases classified elsewhere: Secondary | ICD-10-CM | POA: Diagnosis not present

## 2015-12-29 DIAGNOSIS — L89154 Pressure ulcer of sacral region, stage 4: Secondary | ICD-10-CM

## 2015-12-29 DIAGNOSIS — L98429 Non-pressure chronic ulcer of back with unspecified severity: Secondary | ICD-10-CM

## 2015-12-29 DIAGNOSIS — N186 End stage renal disease: Secondary | ICD-10-CM

## 2015-12-29 DIAGNOSIS — Z794 Long term (current) use of insulin: Secondary | ICD-10-CM

## 2015-12-29 NOTE — Progress Notes (Signed)
Patient ID: Elizabeth [name], female   DOB: 1945/08/17, 70 y.o.   MRN: [id number]    HISTORY AND PHYSICAL   DATE: 12/29/2015  Location:     [place] Room Number: 103 A Place of Service: SNF (31)   Extended Emergency Contact Information Primary Emergency Contact: Gunnar Bulla, [place] Mobile Phone: [phone] Relation: Grandaughter Secondary Emergency Contact: Delphina Cahill [place] Mobile Phone: [phone] Relation: Niece  Advanced Directive information Does patient have an advance directive?: Yes, Type of Advance Directive: Out of facility DNR (pink MOST or yellow form), Does patient want to make changes to advanced directive?: No - Patient declined  Chief Complaint  Patient presents with  . Readmit To SNF    HPI:  70 yo female long term resident seen today as a readmission into SNF following hospital stay for hypoglycemia, sepsis due to UTI, ESRD/HD.  A1c 8.8%. Insulin regimen adjusted. Permissive hyperglycemia allowed. Foley cath changed. She completed 5 days of empiric cefepime. Urine cx (+) multiple morphs. She presents to SNF for continued long term care.  She c/p right foot foul smell and is c/a it. No pain. Nursing reports a few toes appear "fused" and are black. No d/c. Pt denies f/c. 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 Palliative care saw and recommended hospice. Pt is indescisive about whether to pursue hospice. She is noncompliant with HD but frequently says she does not want to stop going when asked about HD. Poor appetite. Sleeps well most nights.   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 She is noncompliant with HD. She missed her tx on 11/4th. Is on 1200 cc fluid restriction; renvela 1600 mg three times daily  DM - A1c 8.8%. Insulin regimen now Novolog SSI qAC. No longer on lantus. CBG 251 today  Hx achalasia of esophagus status post Heller/Toupet on 03-06-15 - stable on protonix 40 mg  daily  Dyslipidemia - LDL 90. Takes pravachol 20 mg daily  Anemia of chronic disease - stable. Hgb 9.6  CAD - she has 3 vessel disease and is s/p cath on 03-24-14: no complaint of chest pain present; takes ASA 81 mg daily   Chronic systolic heart failure - EF is 30%-35%; without symptoms of fluid overload present. mx at HD  PAD - s/p left BKA. 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 Acute hyperkalemia 12/24/2013  . Anemia   . Arthritis of knee, right    "right knee" (06/12/2014)  . Coronary artery disease, non-occlusive January 2016   Moderate p&mLAD & Ostial OM; Myoview Nuclear Stress Test 03/21/2014: EF 35%. Mild hypokinesis of the inferior wall with fixed inferior defect suggesting scar. (no culprit lesion on CATH)  . Critical lower limb ischemia    Status post left BKA following left popliteal and peroneal artery endarterectomy with patch angioplasty.  . Diabetes mellitus, insulin dependent (IDDM), uncontrolled ([place])    [place]; has h/o Hypo & Hyperglycemic episodes, Type II  . Diabetic neuropathy ([place])   . ESRD (end stage renal  disease) ([place])    [place], [place], [place] (06/12/2014)  . Foley catheter in place    for urinary retention  . Foot infection   . Gout    "was in my LLE"  . Hematemesis April 2016   Hospitalized  . History of CVA (cerebrovascular accident) 08/19/2013   "minor one"  . Hyperlipidemia with target LDL less than 70   . Loss of consciousness ([place]) 08/19/2013  . Peripheral vascular disease of lower extremity with ulceration ([place]) January-March 2016   PV Angiogram 03/19/2014: Bilateral AP and PT artery occlusions, 90% Left  peroneal artery with single-vessel Right peroneal and flow  . Urinary retention    DX NEUROGENIC BLADDER--HAS INDWELLING FOLEY CATHETER  . UTI (urinary tract infection) 10/17/2013    Past Surgical History:  Procedure Laterality Date  . ABDOMINAL AORTAGRAM N/A 03/19/2014   Procedure: ABDOMINAL Maxcine Ham;  Surgeon: Elam Dutch, MD;  Location[facility] CATH LAB;  Service: Cardiovascular;  Laterality: N/A;  . AMPUTATION Left 03/26/2014   Procedure: AMPUTATION SECOND LEFT TOE;  Surgeon: Elam Dutch, MD;  Location: [place];  Service: Vascular;  Laterality: Left;  . AMPUTATION Left 05/10/2014   Procedure: AMPUTATION BELOW KNEE;  Surgeon: Rosetta Posner, MD;  Location: [place];  Service: Vascular;  Laterality: Left;  . ANGIOPLASTY Left 04/29/2013   Procedure: ANGIOPLASTY;  Surgeon: Angelia Mould, MD;  Location: [facility] CATH LAB;  Service: Cardiovascular;  Laterality: Left;  . APPLICATION OF A-CELL OF EXTREMITY N/A 10/29/2014   Procedure: PLACEMENT OF APPLICATION OF A-CELL;  Surgeon: Theodoro Kos, DO;  Location: [place];  Service: Plastics;  Laterality: N/A;  . AV FISTULA PLACEMENT Left 04/28/2014   Procedure: CREATION OF LEFT BRACHIOCEPHALIC  ARTERIOVENOUS (AV) FISTULA CREATION;  Surgeon: Conrad [place], MD;  Location: [place];  Service: Vascular;  Laterality: Left;  . BASCILIC VEIN TRANSPOSITION Left 10/30/2012   Procedure: LEFT 1ST STAGE BASCILIC VEIN TRANSPOSITION;  Surgeon: Angelia Mould, MD;  Location: [place];  Service: Vascular;  Laterality: Left;  . BASCILIC VEIN TRANSPOSITION Left 02/05/2013   Procedure: BASCILIC VEIN TRANSPOSITION 2ND STAGE;  Surgeon: Angelia Mould, MD;  Location: [place];  Service: Vascular;  Laterality: Left;  . BELOW KNEE LEG AMPUTATION Left   . ENDARTERECTOMY POPLITEAL Left 03/26/2014   Procedure: Left popliteal and peroneal artery endarterectomy with vein patch angioplasty;  Surgeon: Elam Dutch, MD;  Location: [place];  Service: Vascular;  Laterality: Left;  .  ESOPHAGEAL MANOMETRY N/A 11/24/2014   Procedure: ESOPHAGEAL MANOMETRY (EM);  Surgeon: Carol Ada, MD;  Location: [facility] ENDOSCOPY;  Service: Endoscopy;  Laterality: N/A;  . ESOPHAGOGASTRODUODENOSCOPY N/A 06/13/2014   Procedure: ESOPHAGOGASTRODUODENOSCOPY (EGD);  Surgeon: Carol Ada, MD;  Location: [facility] ENDOSCOPY;  Service: Endoscopy;  Laterality: N/A;  . FISTULOGRAM Left 04/29/2013   Procedure: FISTULOGRAM;  Surgeon: Angelia Mould, MD;  Location: [facility] CATH LAB;  Service: Cardiovascular;  Laterality: Left;  . FISTULOGRAM Left 10/08/2014   Procedure: LEFT ARM FISTULOGRAM;  Surgeon: Conrad [place], MD;  Location: [place];  Service: Vascular;  Laterality: Left;  . INCISION AND DRAINAGE OF WOUND N/A 10/29/2014   Procedure: IRRIGATION AND DEBRIDEMENT OF SACRAL WOUND, PLACEMENT OF A-CELL;  Surgeon: Theodoro Kos, DO;  Location: [place];  Service: Plastics;  Laterality: N/A;  . INSERTION OF SUPRAPUBIC CATHETER N/A 05/16/2012   Procedure: INSERTION OF SUPRAPUBIC CATHETER;  Surgeon: Alexis Frock, MD;  Location: [facility];  Service: Urology;  Laterality: N/A;  . LEFT AND RIGHT HEART  CATHETERIZATION WITH CORONARY ANGIOGRAM N/A 03/24/2014   Procedure: LEFT AND RIGHT HEART CATHETERIZATION WITH CORONARY ANGIOGRAM;  Surgeon: Leonie Man, MD;  Location: [facility] CATH LAB;  Service: Cardiovascular; Moderate disease in p-mLAD & Ostial OM1; normal right heart pressures. Wedge pressure 29 mmHg. --No lesion to explain inferior defect on Myoview and echocardiogram.   . NM MYOCAR PERF WALL MOTION  04/19/2012/ 02/2014   a) low risk study-EF 44%,mild inferolateral hypkinesis; b) EF 35%. Mild hypokinesis of the inferior wall with fixed inferior defect suggesting scar.  Marland Kitchen RADIOLOGY WITH ANESTHESIA N/A 10/25/2013   Procedure: RADIOLOGY WITH ANESTHESIA;  Surgeon: Medication Radiologist, MD;  Location: [place];  Service: Radiology;  Laterality: N/A;  . TRANSTHORACIC ECHOCARDIOGRAM  03/22/2014   EF 30-35%, mild LVH. Severe inferior hypokinesis.  Mild MR.  Marland Kitchen UPPER GI ENDOSCOPY  03/06/2015   Procedure: UPPER GI ENDOSCOPY;  Surgeon: Michael Boston, MD;  Location: [facility];  Service: General;;  . VENOPLASTY Left 10/08/2014   Procedure: LEFT CEPHALIC VEIN VENOPLASTY;  Surgeon: Conrad Sequoyah, MD;  Location: [place];  Service: Vascular;  Laterality: Left;    Patient Care Team: Gildardo Cranker, DO as PCP - General (Internal Medicine) Fleet Contras, MD as Consulting Physician (Nephrology) Pixie Casino, MD as Consulting Physician (Cardiology) Carol Ada, MD as Consulting Physician (Gastroenterology) Michael Boston, MD as Consulting Physician (General Surgery) Conrad Bradford, MD as Consulting Physician (Vascular Surgery) Gerlene Fee, NP as Nurse Practitioner (Geriatric Medicine) [facility] ([place])  Social History   Social History  . Marital status: Single    Spouse name: N/A  . Number of children: N/A  . Years of education: N/A   Occupational History  . disabled    Social History Main Topics  . Smoking status: Never Smoker  . Smokeless tobacco: Never Used  . Alcohol use No     Comment: "stopped drinking in 2013 drank a couple beers/day, 2 days/wk"  . Drug use: No  . Sexual activity: No   Other Topics Concern  . Not on file   Social History Narrative   Granddaughter is contact      Resident of [place]      reports that she has never smoked. She has never used smokeless tobacco. She reports that she does not drink alcohol or use drugs.  Family History  Problem Relation Age of Onset  . Hyperlipidemia Mother   . Hypertension Mother   . Hodgkin's lymphoma Father   . Heart disease Sister    Family Status  Relation Status  . Mother Deceased at age 73  . Father Deceased at age 6  . Sister Deceased  . Child Alive  . Child Alive    Immunization History  Administered Date(s) Administered  . Influenza-Unspecified 11/21/2012, 12/22/2013, 11/22/2014  . PPD Test 12/14/2015   . Pneumococcal Polysaccharide-23 10/21/2013    No Known Allergies  Medications: Patient's Medications  New Prescriptions   No medications on file  Previous Medications   ASCORBIC ACID (VITAMIN C) 500 MG TABLET    Take 500 mg by mouth 2 (two) times daily.   ASPIRIN EC 81 MG TABLET    Take 81 mg by mouth daily.   CETIRIZINE (ZYRTEC) 10 MG TABLET    Take 10 mg by mouth daily as needed for allergies.    FLUTICASONE (FLONASE) 50 MCG/ACT NASAL SPRAY    Place 1 spray into both nostrils 2 (two) times daily.   INSULIN ASPART (NOVOLOG)  100 UNIT/ML INJECTION    200-250 give 2 units, 251-300 give 4 units, 301-350 give 6 units, 351-400 give 8 units, More than 400-call M.D.   LIDOCAINE-PRILOCAINE (EMLA) CREAM    Apply 1 application topically Every Tuesday,Thursday,and Saturday with dialysis.    MULTIPLE VITAMINS-MINERALS (DECUBI-VITE) CAPS    Take 2 capsules by mouth daily.   OXYCODONE-ACETAMINOPHEN (PERCOCET/ROXICET) 5-325 MG TABLET    Take 1 tablet by mouth every 8 (eight) hours as needed for severe pain.   PANTOPRAZOLE (PROTONIX) 40 MG TABLET    Take 1 tablet (40 mg total) by mouth daily.   PRAVASTATIN (PRAVACHOL) 20 MG TABLET    Take 20 mg by mouth every evening.    PROMETHAZINE (PHENERGAN) 25 MG TABLET    Take 25 mg by mouth every 6 (six) hours as needed for nausea or vomiting.   SENNA (SENOKOT) 8.6 MG TABLET    Take 1 tablet by mouth 2 (two) times daily.   SEVELAMER CARBONATE (RENVELA) 800 MG TABLET    Take 1,600 mg by mouth 3 (three) times daily with meals.  Modified Medications   No medications on file  Discontinued Medications   No medications on file    Review of Systems  HENT: Positive for trouble swallowing (occasionally).   Gastrointestinal: Positive for nausea.  Musculoskeletal: Positive for gait problem.  Skin: Positive for wound.  All other systems reviewed and are negative.   Vitals:   12/29/15 0832  BP: 98/67  Pulse: (!) 107  Resp: 18  Temp: 99 F (37.2 C)   TempSrc: Oral  SpO2: 99%  Weight: 80 lb 12.8 oz (36.7 kg)  Height: 5' 3"  (1.6 m)   Body mass index is 14.31 kg/m.  Physical Exam  Constitutional: She is oriented to person, place, and time. She appears well-developed.  Frail appearing in NAD, sitting in w/c  HENT:  Mouth/Throat: Oropharynx is clear and moist. No oropharyngeal exudate.  MMM; no oral thrush  Eyes: Pupils are equal, round, and reactive to light. No scleral icterus.  Neck: Neck supple. Carotid bruit is not present. No tracheal deviation present. No thyromegaly present.  Cardiovascular: Normal rate, regular rhythm and intact distal pulses.  Exam reveals no gallop and no friction rub.   Murmur (1/6 SEM) heard. Pulses:      Dorsalis pedis pulses are 0 on the right side. Left dorsalis pedis pulse not accessible.       Posterior tibial pulses are 0 on the right side. Left posterior tibial pulse not accessible.  +1 pitting RLE edema. Left BKA. No right calf TTP. Left arm AVG with palpable thrill/audible bruit  Pulmonary/Chest: Effort normal and breath sounds normal. No stridor. No respiratory distress. She has no wheezes. She has no rales.  Abdominal: Soft. Bowel sounds are normal. She exhibits no distension and no mass. There is no hepatomegaly. There is no tenderness. There is no rebound and no guarding.  Musculoskeletal:  Left BKA  Lymphadenopathy:    She has no cervical adenopathy.  Neurological: She is alert and oriented to person, place, and time.  Skin: Skin is warm and dry. No rash noted.  Sacral wound care - per wound care; right 2-5th toes black appearing, wet, no purulent d/c but foul smelling, NT. No bleeding  Psychiatric: She has a normal mood and affect. Her behavior is normal. Judgment and thought content normal.  Nursing note reviewed.    Labs reviewed: Nursing Home on 12/17/2015  Component Date Value Ref Range Status  . Glucose  12/17/2015 207  mg/dL Final  Admission on 12/08/2015, Discharged on  12/14/2015  Component Date Value Ref Range Status  . Glucose-Capillary 12/08/2015 186* 65 - 99 mg/dL Final  . WBC 12/08/2015 13.2* 4.0 - 10.5 K/uL Final  . RBC 12/08/2015 4.09  3.87 - 5.11 MIL/uL Final  . Hemoglobin 12/08/2015 11.4* 12.0 - 15.0 g/dL Final  . HCT 12/08/2015 34.4* 36.0 - 46.0 % Final  . MCV 12/08/2015 84.1  78.0 - 100.0 fL Final  . MCH 12/08/2015 27.9  26.0 - 34.0 pg Final  . MCHC 12/08/2015 33.1  30.0 - 36.0 g/dL Final  . RDW 12/08/2015 16.5* 11.5 - 15.5 % Final  . Platelets 12/08/2015 139* 150 - 400 K/uL Final  . Neutrophils Relative % 12/08/2015 91  % Final  . Neutro Abs 12/08/2015 12.1* 1.7 - 7.7 K/uL Final  . Lymphocytes Relative 12/08/2015 3  % Final  . Lymphs Abs 12/08/2015 0.4* 0.7 - 4.0 K/uL Final  . Monocytes Relative 12/08/2015 5  % Final  . Monocytes Absolute 12/08/2015 0.6  0.1 - 1.0 K/uL Final  . Eosinophils Relative 12/08/2015 1  % Final  . Eosinophils Absolute 12/08/2015 0.2  0.0 - 0.7 K/uL Final  . Basophils Relative 12/08/2015 0  % Final  . Basophils Absolute 12/08/2015 0.0  0.0 - 0.1 K/uL Final  . Sodium 12/08/2015 135  135 - 145 mmol/L Final  . Potassium 12/08/2015 3.2* 3.5 - 5.1 mmol/L Final  . Chloride 12/08/2015 98* 101 - 111 mmol/L Final  . CO2 12/08/2015 24  22 - 32 mmol/L Final  . Glucose, Bld 12/08/2015 125* 65 - 99 mg/dL Final  . BUN 12/08/2015 25* 6 - 20 mg/dL Final  . Creatinine, Ser 12/08/2015 3.09* 0.44 - 1.00 mg/dL Final  . Calcium 12/08/2015 8.1* 8.9 - 10.3 mg/dL Final  . Total Protein 12/08/2015 7.2  6.5 - 8.1 g/dL Final  . Albumin 12/08/2015 2.8* 3.5 - 5.0 g/dL Final  . AST 12/08/2015 24  15 - 41 U/L Final  . ALT 12/08/2015 28  14 - 54 U/L Final  . Alkaline Phosphatase 12/08/2015 123  38 - 126 U/L Final  . Total Bilirubin 12/08/2015 0.6  0.3 - 1.2 mg/dL Final  . GFR calc non Af Amer 12/08/2015 14* >60 mL/min Final  . GFR calc Af Amer 12/08/2015 16* >60 mL/min Final   Comment: (NOTE) The eGFR has been calculated using the CKD  EPI equation. This calculation has not been validated in all clinical situations. eGFR's persistently <60 mL/min signify possible Chronic Kidney Disease.   . Anion gap 12/08/2015 13  5 - 15 Final  . Lipase 12/08/2015 17  11 - 51 U/L Final  . Lactic Acid, Venous 12/08/2015 2.38* 0.5 - 1.9 mmol/L Final  . Comment 12/08/2015 NOTIFIED PHYSICIAN   Final  . Specimen Description 12/13/2015 BLOOD RIGHT ARM   Final  . Special Requests 12/13/2015 BOTTLES DRAWN AEROBIC AND ANAEROBIC 5CC   Final  . Culture 12/13/2015 NO GROWTH 5 DAYS   Final  . Report Status 12/13/2015 12/13/2015 FINAL   Final  . Specimen Description 12/13/2015 BLOOD RIGHT HAND   Final  . Special Requests 12/13/2015 BOTTLES DRAWN AEROBIC AND ANAEROBIC 5CC   Final  . Culture 12/13/2015 NO GROWTH 5 DAYS   Final  . Report Status 12/13/2015 12/13/2015 FINAL   Final  . Color, Urine 12/08/2015 RED* YELLOW Final  . APPearance 12/08/2015 TURBID* CLEAR Final  . Specific Gravity, Urine 12/08/2015 1.013  1.005 - 1.030 Final  .  pH 12/08/2015 8.5* 5.0 - 8.0 Final  . Glucose, UA 12/08/2015 NEGATIVE  NEGATIVE mg/dL Final  . Hgb urine dipstick 12/08/2015 LARGE* NEGATIVE Final  . Bilirubin Urine 12/08/2015 SMALL* NEGATIVE Final  . Ketones, ur 12/08/2015 NEGATIVE  NEGATIVE mg/dL Final  . Protein, ur 12/08/2015 >300* NEGATIVE mg/dL Final  . Nitrite 12/08/2015 NEGATIVE  NEGATIVE Final  . Leukocytes, UA 12/08/2015 LARGE* NEGATIVE Final  . Specimen Description 12/09/2015 URINE, RANDOM   Final  . Special Requests 12/09/2015 NONE   Final  . Culture 12/09/2015 MULTIPLE SPECIES PRESENT, SUGGEST RECOLLECTION*  Final  . Report Status 12/09/2015 12/09/2015 FINAL   Final  . Troponin i, poc 12/08/2015 0.03  0.00 - 0.08 ng/mL Final  . Comment 3 12/08/2015          Final   Comment: Due to the release kinetics of cTnI, a negative result within the first hours of the onset of symptoms does not rule out myocardial infarction with certainty. If myocardial  infarction is still suspected, repeat the test at appropriate intervals.   . Glucose-Capillary 12/08/2015 124* 65 - 99 mg/dL Final  . Glucose-Capillary 12/08/2015 69  65 - 99 mg/dL Final  . Squamous Epithelial / LPF 12/08/2015 6-30* NONE SEEN Final  . WBC, UA 12/08/2015 TOO NUMEROUS TO COUNT  0 - 5 WBC/hpf Final  . RBC / HPF 12/08/2015 TOO NUMEROUS TO COUNT  0 - 5 RBC/hpf Final  . Bacteria, UA 12/08/2015 MANY* NONE SEEN Final  . Crystals 12/08/2015 TRIPLE PHOSPHATE CRYSTALS* NEGATIVE Final  . Urine-Other 12/08/2015 AMORPHOUS URATES/PHOSPHATES   Final  . Lactic Acid, Venous 12/08/2015 1.74  0.5 - 1.9 mmol/L Final  . Lactic Acid, Venous 12/08/2015 1.9  0.5 - 1.9 mmol/L Final  . Lactic Acid, Venous 12/09/2015 1.3  0.5 - 1.9 mmol/L Final  . Procalcitonin 12/08/2015 7.04  ng/mL Final   Comment:        Interpretation: PCT > 2 ng/mL: Systemic infection (sepsis) is likely, unless other causes are known. (NOTE)         ICU PCT Algorithm               Non ICU PCT Algorithm    ----------------------------     ------------------------------         PCT < 0.25 ng/mL                 PCT < 0.1 ng/mL     Stopping of antibiotics            Stopping of antibiotics       strongly encouraged.               strongly encouraged.    ----------------------------     ------------------------------       PCT level decrease by               PCT < 0.25 ng/mL       >= 80% from peak PCT       OR PCT 0.25 - 0.5 ng/mL          Stopping of antibiotics                                             encouraged.     Stopping of antibiotics           encouraged.    ----------------------------     ------------------------------  PCT level decrease by              PCT >= 0.25 ng/mL       < 80% from peak PCT        AND PCT >= 0.5 ng/mL            Continuing antibiotics                                                                        encouraged.       Continuing antibiotics            encouraged.     ----------------------------     ------------------------------     PCT level increase compared          PCT > 0.5 ng/mL         with peak PCT AND          PCT >= 0.5 ng/mL             Escalation of antibiotics                                          strongly encouraged.      Escalation of antibiotics        strongly encouraged.   . Prothrombin Time 12/08/2015 15.4* 11.4 - 15.2 seconds Final  . INR 12/08/2015 1.21   Final  . aPTT 12/08/2015 34  24 - 36 seconds Final  . Sodium 12/09/2015 131* 135 - 145 mmol/L Final  . Potassium 12/09/2015 4.4  3.5 - 5.1 mmol/L Final  . Chloride 12/09/2015 92* 101 - 111 mmol/L Final  . CO2 12/09/2015 27  22 - 32 mmol/L Final  . Glucose, Bld 12/09/2015 141* 65 - 99 mg/dL Final  . BUN 12/09/2015 31* 6 - 20 mg/dL Final  . Creatinine, Ser 12/09/2015 3.59* 0.44 - 1.00 mg/dL Final  . Calcium 12/09/2015 8.1* 8.9 - 10.3 mg/dL Final  . GFR calc non Af Amer 12/09/2015 12* >60 mL/min Final  . GFR calc Af Amer 12/09/2015 14* >60 mL/min Final   Comment: (NOTE) The eGFR has been calculated using the CKD EPI equation. This calculation has not been validated in all clinical situations. eGFR's persistently <60 mL/min signify possible Chronic Kidney Disease.   . Anion gap 12/09/2015 12  5 - 15 Final  . WBC 12/09/2015 7.8  4.0 - 10.5 K/uL Final  . RBC 12/09/2015 3.59* 3.87 - 5.11 MIL/uL Final  . Hemoglobin 12/09/2015 9.7* 12.0 - 15.0 g/dL Final  . HCT 12/09/2015 29.5* 36.0 - 46.0 % Final  . MCV 12/09/2015 82.2  78.0 - 100.0 fL Final  . MCH 12/09/2015 27.0  26.0 - 34.0 pg Final  . MCHC 12/09/2015 32.9  30.0 - 36.0 g/dL Final  . RDW 12/09/2015 16.4* 11.5 - 15.5 % Final  . Platelets 12/09/2015 148* 150 - 400 K/uL Final  . Glucose-Capillary 12/08/2015 81  65 - 99 mg/dL Final  . MRSA by PCR 12/09/2015 NEGATIVE  NEGATIVE Final   Comment:        The GeneXpert MRSA Assay (FDA approved for NASAL specimens only), is one  component of a comprehensive MRSA  colonization surveillance program. It is not intended to diagnose MRSA infection nor to guide or monitor treatment for MRSA infections.   . Glucose-Capillary 12/09/2015 158* 65 - 99 mg/dL Final  . Comment 1 12/09/2015 Notify RN   Final  . Glucose-Capillary 12/09/2015 208* 65 - 99 mg/dL Final  . Hgb A1c MFr Bld 12/10/2015 8.8* 4.8 - 5.6 % Final   Comment: (NOTE)         Pre-diabetes: 5.7 - 6.4         Diabetes: >6.4         Glycemic control for adults with diabetes: <7.0   . Mean Plasma Glucose 12/10/2015 206  mg/dL Final   Comment: (NOTE) Performed At: [facility] [place], [place] [id number] Lindon Romp MD PO:[id number]   . Glucose-Capillary 12/09/2015 234* 65 - 99 mg/dL Final  . Glucose-Capillary 12/09/2015 232* 65 - 99 mg/dL Final  . Glucose-Capillary 12/09/2015 62* 65 - 99 mg/dL Final  . Glucose-Capillary 12/09/2015 82  65 - 99 mg/dL Final  . Glucose-Capillary 12/09/2015 114* 65 - 99 mg/dL Final  . Comment 1 12/09/2015 Notify RN   Final  . Glucose-Capillary 12/10/2015 70  65 - 99 mg/dL Final  . Glucose-Capillary 12/10/2015 123* 65 - 99 mg/dL Final  . Sodium 12/10/2015 129* 135 - 145 mmol/L Final  . Potassium 12/10/2015 4.8  3.5 - 5.1 mmol/L Final  . Chloride 12/10/2015 92* 101 - 111 mmol/L Final  . CO2 12/10/2015 24  22 - 32 mmol/L Final  . Glucose, Bld 12/10/2015 172* 65 - 99 mg/dL Final  . BUN 12/10/2015 42* 6 - 20 mg/dL Final  . Creatinine, Ser 12/10/2015 4.94* 0.44 - 1.00 mg/dL Final  . Calcium 12/10/2015 9.0  8.9 - 10.3 mg/dL Final  . Phosphorus 12/10/2015 6.7* 2.5 - 4.6 mg/dL Final  . Albumin 12/10/2015 2.5* 3.5 - 5.0 g/dL Final  . GFR calc non Af Amer 12/10/2015 8* >60 mL/min Final  . GFR calc Af Amer 12/10/2015 9* >60 mL/min Final   Comment: (NOTE) The eGFR has been calculated using the CKD EPI equation. This calculation has not been validated in all clinical situations. eGFR's persistently <60 mL/min signify possible Chronic  Kidney Disease.   . Anion gap 12/10/2015 13  5 - 15 Final  . WBC 12/10/2015 8.9  4.0 - 10.5 K/uL Final  . RBC 12/10/2015 3.52* 3.87 - 5.11 MIL/uL Final  . Hemoglobin 12/10/2015 9.6* 12.0 - 15.0 g/dL Final  . HCT 12/10/2015 29.3* 36.0 - 46.0 % Final  . MCV 12/10/2015 83.2  78.0 - 100.0 fL Final  . MCH 12/10/2015 27.3  26.0 - 34.0 pg Final  . MCHC 12/10/2015 32.8  30.0 - 36.0 g/dL Final  . RDW 12/10/2015 16.6* 11.5 - 15.5 % Final  . Platelets 12/10/2015 189  150 - 400 K/uL Final  . Glucose-Capillary 12/10/2015 102* 65 - 99 mg/dL Final  . Glucose-Capillary 12/10/2015 162* 65 - 99 mg/dL Final  . Glucose-Capillary 12/11/2015 99  65 - 99 mg/dL Final  . Glucose-Capillary 12/11/2015 178* 65 - 99 mg/dL Final  . Glucose-Capillary 12/11/2015 168* 65 - 99 mg/dL Final  . Glucose-Capillary 12/11/2015 151* 65 - 99 mg/dL Final  . Glucose-Capillary 12/12/2015 74  65 - 99 mg/dL Final  . Glucose-Capillary 12/12/2015 144* 65 - 99 mg/dL Final  . Glucose-Capillary 12/12/2015 120* 65 - 99 mg/dL Final  . Glucose-Capillary 12/13/2015 78  65 - 99 mg/dL Final  . Edmonia Lynch  12/13/2015 190* 65 - 99 mg/dL Final  . Glucose-Capillary 12/13/2015 147* 65 - 99 mg/dL Final  . Glucose-Capillary 12/13/2015 114* 65 - 99 mg/dL Final  . Glucose-Capillary 12/14/2015 133* 65 - 99 mg/dL Final  . Glucose-Capillary 12/14/2015 201* 65 - 99 mg/dL Final  . Comment 1 12/14/2015 Notify RN   Final  . Comment 2 12/14/2015 Document in Chart   Final    Dg Chest Port 1 View  Result Date: 12/08/2015 CLINICAL DATA:  Sepsis and hypothermia EXAM: PORTABLE CHEST 1 VIEW COMPARISON:  Chest radiograph 03/09/2015 FINDINGS: There is calcification within the aortic arch. Small bilateral pleural effusions. The right-sided effusion has increased in size. There diffusely increased pulmonary markings throughout both lungs. No focal area of consolidation. No pneumothorax. IMPRESSION: Slightly increased size of right pleural effusion. Unchanged  left pleural effusion. No focal airspace consolidation. Electronically Signed   By: Ulyses Jarred M.D.   On: 12/08/2015 18:04     Assessment/Plan   ICD-9-CM ICD-10-CM   1. Gangrene of toe of right foot (HCC) 785.4 I96   2. Peripheral artery disease (HCC) 443.9 I73.9   3. ESRD on hemodialysis (HCC) 585.6 N18.6    V45.11 Z99.2   4. Type 2 diabetes mellitus with chronic kidney disease on chronic dialysis, with long-term current use of insulin (HCC) 250.40 E11.22    585.9 N18.6    V45.11 Z99.2    V58.67 Z79.4   5. Anemia of chronic disease 285.29 D63.8   6. Chronic diastolic congestive heart failure (HCC) 428.32 I50.32    428.0    7. Protein-calorie malnutrition, severe ([place]) 262 E43   8. 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   Check right foot xray to r/o osteomeylitis  Refer to vascular sx   Wound care to follow  Palliative care to follow  Cont current meds as ordered  PT/OT as indicated  F/u with HD as scheduled  GOAL: long term care. Communicated with pt and nursing.  Will follow  Issiac Jamar S. Perlie Gold  [facility] [address] [place], [place] [address] [phone] Cell (Monday-Friday 8 AM - 5 PM) [phone] After 5 PM and follow prompts

## 2015-12-30 ENCOUNTER — Encounter (HOSPITAL_COMMUNITY): Payer: Self-pay | Admitting: Emergency Medicine

## 2015-12-30 ENCOUNTER — Inpatient Hospital Stay (HOSPITAL_COMMUNITY)
Admission: EM | Admit: 2015-12-30 | Discharge: 2016-01-04 | DRG: 239 | Disposition: A | Discharge status: Skilled Nursing Facility | Payer: Medicare Other | Attending: Internal Medicine | Admitting: Internal Medicine

## 2015-12-30 ENCOUNTER — Emergency Department (HOSPITAL_COMMUNITY)
Admit: 2015-12-30 | Discharge: 2015-12-30 | Disposition: A | Discharge status: Home / Self Care | Payer: Medicare Other | Attending: Emergency Medicine | Admitting: Emergency Medicine

## 2015-12-30 ENCOUNTER — Emergency Department (HOSPITAL_COMMUNITY): Payer: Medicare Other

## 2015-12-30 DIAGNOSIS — I96 Gangrene, not elsewhere classified: Secondary | ICD-10-CM

## 2015-12-30 DIAGNOSIS — A480 Gas gangrene: Secondary | ICD-10-CM | POA: Diagnosis not present

## 2015-12-30 DIAGNOSIS — N186 End stage renal disease: Secondary | ICD-10-CM

## 2015-12-30 DIAGNOSIS — E1143 Type 2 diabetes mellitus with diabetic autonomic (poly)neuropathy: Secondary | ICD-10-CM | POA: Diagnosis present

## 2015-12-30 DIAGNOSIS — I5032 Chronic diastolic (congestive) heart failure: Secondary | ICD-10-CM | POA: Diagnosis not present

## 2015-12-30 DIAGNOSIS — I255 Ischemic cardiomyopathy: Secondary | ICD-10-CM | POA: Diagnosis present

## 2015-12-30 DIAGNOSIS — I5042 Chronic combined systolic (congestive) and diastolic (congestive) heart failure: Secondary | ICD-10-CM | POA: Diagnosis not present

## 2015-12-30 DIAGNOSIS — D631 Anemia in chronic kidney disease: Secondary | ICD-10-CM | POA: Diagnosis present

## 2015-12-30 DIAGNOSIS — E785 Hyperlipidemia, unspecified: Secondary | ICD-10-CM | POA: Diagnosis not present

## 2015-12-30 DIAGNOSIS — L03115 Cellulitis of right lower limb: Secondary | ICD-10-CM

## 2015-12-30 DIAGNOSIS — E1129 Type 2 diabetes mellitus with other diabetic kidney complication: Secondary | ICD-10-CM | POA: Diagnosis present

## 2015-12-30 DIAGNOSIS — Z66 Do not resuscitate: Secondary | ICD-10-CM | POA: Diagnosis present

## 2015-12-30 DIAGNOSIS — E871 Hypo-osmolality and hyponatremia: Secondary | ICD-10-CM | POA: Diagnosis present

## 2015-12-30 DIAGNOSIS — R64 Cachexia: Secondary | ICD-10-CM | POA: Diagnosis present

## 2015-12-30 DIAGNOSIS — I2510 Atherosclerotic heart disease of native coronary artery without angina pectoris: Secondary | ICD-10-CM | POA: Diagnosis present

## 2015-12-30 DIAGNOSIS — M109 Gout, unspecified: Secondary | ICD-10-CM | POA: Diagnosis not present

## 2015-12-30 DIAGNOSIS — E1165 Type 2 diabetes mellitus with hyperglycemia: Secondary | ICD-10-CM | POA: Diagnosis present

## 2015-12-30 DIAGNOSIS — E1122 Type 2 diabetes mellitus with diabetic chronic kidney disease: Secondary | ICD-10-CM | POA: Diagnosis present

## 2015-12-30 DIAGNOSIS — Z9115 Patient's noncompliance with renal dialysis: Secondary | ICD-10-CM

## 2015-12-30 DIAGNOSIS — Z79899 Other long term (current) drug therapy: Secondary | ICD-10-CM

## 2015-12-30 DIAGNOSIS — Z7982 Long term (current) use of aspirin: Secondary | ICD-10-CM

## 2015-12-30 DIAGNOSIS — N2581 Secondary hyperparathyroidism of renal origin: Secondary | ICD-10-CM | POA: Diagnosis present

## 2015-12-30 DIAGNOSIS — I132 Hypertensive heart and chronic kidney disease with heart failure and with stage 5 chronic kidney disease, or end stage renal disease: Secondary | ICD-10-CM | POA: Diagnosis present

## 2015-12-30 DIAGNOSIS — L89154 Pressure ulcer of sacral region, stage 4: Secondary | ICD-10-CM | POA: Diagnosis present

## 2015-12-30 DIAGNOSIS — M1711 Unilateral primary osteoarthritis, right knee: Secondary | ICD-10-CM | POA: Diagnosis not present

## 2015-12-30 DIAGNOSIS — Z89512 Acquired absence of left leg below knee: Secondary | ICD-10-CM

## 2015-12-30 DIAGNOSIS — Z794 Long term (current) use of insulin: Secondary | ICD-10-CM

## 2015-12-30 DIAGNOSIS — E875 Hyperkalemia: Secondary | ICD-10-CM | POA: Diagnosis present

## 2015-12-30 DIAGNOSIS — Z8249 Family history of ischemic heart disease and other diseases of the circulatory system: Secondary | ICD-10-CM

## 2015-12-30 DIAGNOSIS — E43 Unspecified severe protein-calorie malnutrition: Secondary | ICD-10-CM | POA: Diagnosis present

## 2015-12-30 DIAGNOSIS — Z992 Dependence on renal dialysis: Secondary | ICD-10-CM

## 2015-12-30 DIAGNOSIS — R339 Retention of urine, unspecified: Secondary | ICD-10-CM | POA: Diagnosis not present

## 2015-12-30 DIAGNOSIS — K3184 Gastroparesis: Secondary | ICD-10-CM | POA: Diagnosis present

## 2015-12-30 DIAGNOSIS — N319 Neuromuscular dysfunction of bladder, unspecified: Secondary | ICD-10-CM | POA: Diagnosis not present

## 2015-12-30 DIAGNOSIS — G546 Phantom limb syndrome with pain: Secondary | ICD-10-CM | POA: Diagnosis not present

## 2015-12-30 DIAGNOSIS — D638 Anemia in other chronic diseases classified elsewhere: Secondary | ICD-10-CM | POA: Diagnosis not present

## 2015-12-30 DIAGNOSIS — E1152 Type 2 diabetes mellitus with diabetic peripheral angiopathy with gangrene: Principal | ICD-10-CM | POA: Diagnosis present

## 2015-12-30 DIAGNOSIS — Z8673 Personal history of transient ischemic attack (TIA), and cerebral infarction without residual deficits: Secondary | ICD-10-CM

## 2015-12-30 HISTORY — DX: Dependence on renal dialysis: N18.6

## 2015-12-30 HISTORY — DX: Dependence on renal dialysis: Z99.2

## 2015-12-30 HISTORY — DX: Presence of other specified devices: Z97.8

## 2015-12-30 HISTORY — DX: Presence of urogenital implants: Z96.0

## 2015-12-30 LAB — BASIC METABOLIC PANEL
Anion gap: 12 (ref 5–15)
BUN: 39 mg/dL — ABNORMAL HIGH (ref 6–20)
CHLORIDE: 96 mmol/L — AB (ref 101–111)
CO2: 24 mmol/L (ref 22–32)
Calcium: 8.5 mg/dL — ABNORMAL LOW (ref 8.9–10.3)
Creatinine, Ser: 4.16 mg/dL — ABNORMAL HIGH (ref 0.44–1.00)
GFR calc non Af Amer: 10 mL/min — ABNORMAL LOW (ref >60)
GFR, EST AFRICAN AMERICAN: 11 mL/min — AB (ref >60)
Glucose, Bld: 161 mg/dL — ABNORMAL HIGH (ref 65–99)
POTASSIUM: 4.6 mmol/L (ref 3.5–5.1)
SODIUM: 132 mmol/L — AB (ref 135–145)

## 2015-12-30 LAB — CBC
HEMATOCRIT: 25.7 % — AB (ref 36.0–46.0)
Hemoglobin: 8.4 g/dL — ABNORMAL LOW (ref 12.0–15.0)
MCH: 26.7 pg (ref 26.0–34.0)
MCHC: 32.7 g/dL (ref 30.0–36.0)
MCV: 81.6 fL (ref 78.0–100.0)
PLATELETS: 329 10*3/uL (ref 150–400)
RBC: 3.15 MIL/uL — ABNORMAL LOW (ref 3.87–5.11)
RDW: 16.5 % — AB (ref 11.5–15.5)
WBC: 20 10*3/uL — ABNORMAL HIGH (ref 4.0–10.5)

## 2015-12-30 LAB — CREATININE, SERUM
CREATININE: 4.28 mg/dL — AB (ref 0.44–1.00)
GFR calc Af Amer: 11 mL/min — ABNORMAL LOW (ref >60)
GFR, EST NON AFRICAN AMERICAN: 10 mL/min — AB (ref >60)

## 2015-12-30 LAB — CBC WITH DIFFERENTIAL/PLATELET
BASOS PCT: 0 %
Basophils Absolute: 0 10*3/uL (ref 0.0–0.1)
EOS ABS: 0.1 10*3/uL (ref 0.0–0.7)
Eosinophils Relative: 0 %
HCT: 26.9 % — ABNORMAL LOW (ref 36.0–46.0)
HEMOGLOBIN: 8.8 g/dL — AB (ref 12.0–15.0)
LYMPHS ABS: 0.5 10*3/uL — AB (ref 0.7–4.0)
Lymphocytes Relative: 2 %
MCH: 27 pg (ref 26.0–34.0)
MCHC: 32.7 g/dL (ref 30.0–36.0)
MCV: 82.5 fL (ref 78.0–100.0)
Monocytes Absolute: 1.1 10*3/uL — ABNORMAL HIGH (ref 0.1–1.0)
Monocytes Relative: 6 %
NEUTROS ABS: 18.2 10*3/uL — AB (ref 1.7–7.7)
NEUTROS PCT: 92 %
Platelets: 316 10*3/uL (ref 150–400)
RBC: 3.26 MIL/uL — AB (ref 3.87–5.11)
RDW: 16.7 % — ABNORMAL HIGH (ref 11.5–15.5)
WBC: 19.9 10*3/uL — AB (ref 4.0–10.5)

## 2015-12-30 LAB — GLUCOSE, CAPILLARY: Glucose-Capillary: 229 mg/dL — ABNORMAL HIGH (ref 65–99)

## 2015-12-30 MED ORDER — VITAMIN C 500 MG PO TABS
500.0000 mg | ORAL_TABLET | Freq: Two times a day (BID) | ORAL | Status: DC
  Administered 2015-12-30 – 2016-01-03 (×3): 500 mg via ORAL
  Filled 2015-12-30 (×5): qty 1

## 2015-12-30 MED ORDER — INSULIN ASPART 100 UNIT/ML ~~LOC~~ SOLN
0.0000 [IU] | SUBCUTANEOUS | Status: DC
  Administered 2015-12-30: 3 [IU] via SUBCUTANEOUS
  Administered 2015-12-31 – 2016-01-02 (×2): 2 [IU] via SUBCUTANEOUS
  Administered 2016-01-02: 1 [IU] via SUBCUTANEOUS
  Administered 2016-01-03 – 2016-01-04 (×3): 2 [IU] via SUBCUTANEOUS

## 2015-12-30 MED ORDER — PIPERACILLIN-TAZOBACTAM 3.375 G IVPB
3.3750 g | Freq: Once | INTRAVENOUS | Status: AC
  Administered 2015-12-30: 3.375 g via INTRAVENOUS
  Filled 2015-12-30: qty 50

## 2015-12-30 MED ORDER — VANCOMYCIN HCL IN DEXTROSE 1-5 GM/200ML-% IV SOLN
1000.0000 mg | Freq: Once | INTRAVENOUS | Status: AC
Start: 2015-12-30 — End: 2015-12-30
  Administered 2015-12-30: 1000 mg via INTRAVENOUS
  Filled 2015-12-30: qty 200

## 2015-12-30 MED ORDER — HEPARIN SODIUM (PORCINE) 5000 UNIT/ML IJ SOLN
5000.0000 [IU] | Freq: Three times a day (TID) | INTRAMUSCULAR | Status: DC
  Filled 2015-12-30 (×3): qty 1

## 2015-12-30 MED ORDER — ACETAMINOPHEN 650 MG RE SUPP: 650.0000 mg | Freq: Four times a day (QID) | RECTAL | Status: DC | PRN

## 2015-12-30 MED ORDER — OXYCODONE-ACETAMINOPHEN 5-325 MG PO TABS
1.0000 | ORAL_TABLET | Freq: Three times a day (TID) | ORAL | Status: DC | PRN
  Administered 2015-12-31 – 2016-01-01 (×2): 1 via ORAL
  Filled 2015-12-30 (×3): qty 1

## 2015-12-30 MED ORDER — ACETAMINOPHEN 325 MG PO TABS: 650.0000 mg | ORAL_TABLET | Freq: Four times a day (QID) | ORAL | Status: DC | PRN

## 2015-12-30 MED ORDER — PIPERACILLIN-TAZOBACTAM 3.375 G IVPB
3.3750 g | Freq: Two times a day (BID) | INTRAVENOUS | Status: DC
  Administered 2015-12-31 – 2016-01-03 (×7): 3.375 g via INTRAVENOUS
  Filled 2015-12-30 (×8): qty 50

## 2015-12-30 MED ORDER — LIDOCAINE-PRILOCAINE 2.5-2.5 % EX CREA: 1.0000 "application " | TOPICAL_CREAM | CUTANEOUS | Status: DC

## 2015-12-30 MED ORDER — FLUTICASONE PROPIONATE 50 MCG/ACT NA SUSP
1.0000 | Freq: Two times a day (BID) | NASAL | Status: DC
  Filled 2015-12-30: qty 16

## 2015-12-30 MED ORDER — LORATADINE 10 MG PO TABS
10.0000 mg | ORAL_TABLET | Freq: Every day | ORAL | Status: DC
Start: 2015-12-30 — End: 2016-01-04
  Filled 2015-12-30 (×3): qty 1

## 2015-12-30 MED ORDER — SODIUM CHLORIDE 0.9% FLUSH
3.0000 mL | Freq: Two times a day (BID) | INTRAVENOUS | Status: DC
  Administered 2015-12-30 – 2016-01-04 (×3): 3 mL via INTRAVENOUS

## 2015-12-30 MED ORDER — ASPIRIN EC 81 MG PO TBEC
81.0000 mg | DELAYED_RELEASE_TABLET | Freq: Every day | ORAL | Status: DC
Start: 2015-12-30 — End: 2016-01-04
  Administered 2015-12-30 – 2016-01-03 (×2): 81 mg via ORAL
  Filled 2015-12-30 (×3): qty 1

## 2015-12-30 MED ORDER — PRAVASTATIN SODIUM 20 MG PO TABS
20.0000 mg | ORAL_TABLET | Freq: Every evening | ORAL | Status: DC
  Administered 2015-12-31 – 2016-01-03 (×2): 20 mg via ORAL
  Filled 2015-12-30 (×2): qty 1

## 2015-12-30 MED ORDER — SENNA 8.6 MG PO TABS
1.0000 | ORAL_TABLET | Freq: Two times a day (BID) | ORAL | Status: DC
  Filled 2015-12-30 (×5): qty 1

## 2015-12-30 MED ORDER — ENSURE ENLIVE PO LIQD: 237.0000 mL | Freq: Two times a day (BID) | ORAL | Status: DC

## 2015-12-30 MED ORDER — PANTOPRAZOLE SODIUM 40 MG PO TBEC
40.0000 mg | DELAYED_RELEASE_TABLET | Freq: Every day | ORAL | Status: DC
  Administered 2015-12-30: 40 mg via ORAL
  Filled 2015-12-30 (×3): qty 1

## 2015-12-30 MED ORDER — PROMETHAZINE HCL 25 MG PO TABS: 25.0000 mg | ORAL_TABLET | Freq: Four times a day (QID) | ORAL | Status: DC | PRN

## 2015-12-30 MED ORDER — SEVELAMER CARBONATE 800 MG PO TABS
1600.0000 mg | ORAL_TABLET | Freq: Three times a day (TID) | ORAL | Status: DC
  Administered 2016-01-03: 1600 mg via ORAL
  Filled 2015-12-30 (×3): qty 2

## 2015-12-30 NOTE — ED Notes (Addendum)
Cammie Sickle, grand daughter and [place], phone # [phone]

## 2015-12-30 NOTE — H&P (Addendum)
TRH H&P   Patient Demographics:    Elizabeth [name], is a 70 y.o. female  MRN: [id number]   DOB - October 14, 1945  Admit Date - 12/30/2015  Outpatient Primary MD for the patient is Kirt Boys, DO  Referring MD/NP/PA: Dr Patria Mane  Outpatient Specialists: renal Dr Meyer Cory, Vascular Dr Chen,Dr Early performed left below knee amputation in March 2016  Patient coming from: SNF  No chief complaint on file.     HPI:    Elizabeth [name]  is a 70 y.o. female,  with medical history significant of ESRD on Tu/Th/Sat HD, HLD, h/o CVA, chronic indwelling foley, DM, PVD s/p amputation , patient was sent from SNF facility for increase in discoloration and necrosis of her toes on the right foot, progressive symptoms over one week, patient denies any fever or chills, but reports it was weakness and poor appetite, patient with history of gangrene and left lower extremity status post amputation by Dr. early in March 2016. - in ED workup significant for leukocytosis of 19.9, anemia with hemoglobin of 8.8, creatinine of 4.16, lactic acid within normal limits at 1.3, foot x-ray significant of gas gangrene and right foot, patient started on IV vancomycin and Zosyn.    Review of systems:    In addition to the HPI above,  No Fever-chills, No Headache, No changes with Vision or hearing, No problems swallowing food or Liquids,But has poor appetite No Chest pain, Cough or Shortness of Breath, No Abdominal pain, No Nausea or Vommitting, Bowel movements are regular, No Blood in stool or Urine, No dysuria, has chronic Foley, Has chronic sacral pressure ulcers Right foot pain. Reports generalized weakness and fatigue. Reports weight  loss, No polyuria, polydypsia or polyphagia, No significant Mental Stressors.  A full 10 point Review of Systems was done, except as stated above, all other Review of Systems  were negative.   With Past History of the following :    Past Medical History:  Diagnosis Date  . Acute hyperkalemia 12/24/2013  . Anemia   . Arthritis of knee, right    "right knee" (06/12/2014)  . Coronary artery disease, non-occlusive January 2016   Moderate p&mLAD & Ostial OM; Myoview Nuclear Stress Test 03/21/2014: EF 35%. Mild hypokinesis of the inferior wall with fixed inferior defect suggesting scar. (no culprit lesion on CATH)  . Critical lower limb ischemia    Status post left BKA following left popliteal and peroneal artery endarterectomy with patch angioplasty.  . Diabetes mellitus, insulin dependent (IDDM), uncontrolled (HCC)    Brittle; has h/o Hypo & Hyperglycemic episodes, Type II  . Diabetic neuropathy (HCC)   . ESRD (end stage renal disease) (HCC)    [address], [place], S (06/12/2014)  . Foley catheter in place    for urinary retention  . Foot infection   . Gout    "was in my LLE"  . Hematemesis April  2016   Hospitalized  . History of CVA (cerebrovascular accident) 08/19/2013   "minor one"  . Hyperlipidemia with target LDL less than 70   . Loss of consciousness (HCC) 08/19/2013  . 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 Urinary retention    DX NEUROGENIC BLADDER--HAS INDWELLING FOLEY CATHETER  . UTI (urinary tract infection) 10/17/2013      Past Surgical History:  Procedure Laterality Date  . ABDOMINAL AORTAGRAM N/A 03/19/2014   Procedure: ABDOMINAL Ronny FlurryAORTAGRAM;  Surgeon: Sherren Kernsharles E Fields, MD;  [facility];  Service: Cardiovascular;  Laterality: N/A;  . AMPUTATION Left 03/26/2014   Procedure: AMPUTATION SECOND LEFT TOE;  Surgeon: Sherren Kernsharles E Fields, MD;  [facility];  Service: Vascular;  Laterality: Left;  . AMPUTATION Left 05/10/2014   Procedure: AMPUTATION BELOW  KNEE;  Surgeon: Larina Earthlyodd F Early, MD;  [facility];  Service: Vascular;  Laterality: Left;  . ANGIOPLASTY Left 04/29/2013   Procedure: ANGIOPLASTY;  Surgeon: Chuck Hinthristopher S Dickson, MD;  [facility];  Service: Cardiovascular;  Laterality: Left;  . APPLICATION OF A-CELL OF EXTREMITY N/A 10/29/2014   Procedure: PLACEMENT OF APPLICATION OF A-CELL;  Surgeon: Wayland Denislaire Sanger, DO;  Location: [facility] OR;  Service: Plastics;  Laterality: N/A;  . AV FISTULA PLACEMENT Left 04/28/2014   Procedure: CREATION OF LEFT BRACHIOCEPHALIC  ARTERIOVENOUS (AV) FISTULA CREATION;  Surgeon: Fransisco HertzBrian L Chen, MD;  Location: [facility] OR;  Service: Vascular;  Laterality: Left;  . BASCILIC VEIN TRANSPOSITION Left 10/30/2012   Procedure: LEFT 1ST STAGE BASCILIC VEIN TRANSPOSITION;  Surgeon: Chuck Hinthristopher S Dickson, MD;  [facility];  Service: Vascular;  Laterality: Left;  . BASCILIC VEIN TRANSPOSITION Left 02/05/2013   Procedure: BASCILIC VEIN TRANSPOSITION 2ND STAGE;  Surgeon: Chuck Hinthristopher S Dickson, MD;  [facility];  Service: Vascular;  Laterality: Left;  . BELOW KNEE LEG AMPUTATION Left   . ENDARTERECTOMY POPLITEAL Left 03/26/2014   Procedure: Left popliteal and peroneal artery endarterectomy with vein patch angioplasty;  Surgeon: Sherren Kernsharles E Fields, MD;  [facility];  Service: Vascular;  Laterality: Left;  . ESOPHAGEAL MANOMETRY N/A 11/24/2014   Procedure: ESOPHAGEAL MANOMETRY (EM);  Surgeon: Jeani HawkingPatrick Hung, MD;  Location: [facility] ENDOSCOPY;  Service: Endoscopy;  Laterality: N/A;  . ESOPHAGOGASTRODUODENOSCOPY N/A 06/13/2014   Procedure: ESOPHAGOGASTRODUODENOSCOPY (EGD);  Surgeon: Jeani HawkingPatrick Hung, MD;  [facility];  Service: Endoscopy;  Laterality: N/A;  . FISTULOGRAM Left 04/29/2013   Procedure: FISTULOGRAM;  Surgeon: Chuck Hinthristopher S Dickson, MD;  [facility];  Service: Cardiovascular;  Laterality: Left;  . FISTULOGRAM Left 10/08/2014   Procedure: LEFT ARM FISTULOGRAM;  Surgeon: Fransisco HertzBrian L Chen, MD;  [facility];  Service:  Vascular;  Laterality: Left;  . INCISION AND DRAINAGE OF WOUND N/A 10/29/2014   Procedure: IRRIGATION AND DEBRIDEMENT OF SACRAL WOUND, PLACEMENT OF A-CELL;  Surgeon: Wayland Denislaire Sanger, DO;  Location: [facility] OR;  Service: Plastics;  Laterality: N/A;  . INSERTION OF SUPRAPUBIC CATHETER N/A 05/16/2012   Procedure: INSERTION OF SUPRAPUBIC CATHETER;  Surgeon: Sebastian Acheheodore Manny, MD;  Location: [facility] ORS;  Service: Urology;  Laterality: N/A;  . LEFT AND RIGHT HEART CATHETERIZATION WITH CORONARY ANGIOGRAM N/A 03/24/2014   Procedure: LEFT AND RIGHT HEART CATHETERIZATION WITH CORONARY ANGIOGRAM;  Surgeon: Marykay Lexavid W Harding, MD;  [facility];  Service: Cardiovascular; Moderate disease in p-mLAD & Ostial OM1; normal right  heart pressures. Wedge pressure 29 mmHg. --No lesion to explain inferior defect on Myoview and echocardiogram.   . NM MYOCAR PERF WALL MOTION  04/19/2012/ 02/2014   a) low risk study-EF 44%,mild inferolateral hypkinesis; b) EF 35%. Mild hypokinesis of the inferior wall with fixed inferior defect suggesting scar.  Marland Kitchen RADIOLOGY WITH ANESTHESIA N/A 10/25/2013   Procedure: RADIOLOGY WITH ANESTHESIA;  Surgeon: Medication Radiologist, MD;  Location: [facility] OR;  Service: Radiology;  Laterality: N/A;  . TRANSTHORACIC ECHOCARDIOGRAM  03/22/2014   EF 30-35%, mild LVH. Severe inferior hypokinesis. Mild MR.  Marland Kitchen UPPER GI ENDOSCOPY  03/06/2015   Procedure: UPPER GI ENDOSCOPY;  Surgeon: Karie Soda, MD;  Location: [facility] ORS;  Service: General;;  . VENOPLASTY Left 10/08/2014   Procedure: LEFT CEPHALIC VEIN VENOPLASTY;  Surgeon: Fransisco Hertz, MD;  [facility];  Service: Vascular;  Laterality: Left;      Social History:     Social History  Substance Use Topics  . Smoking status: Never Smoker  . Smokeless tobacco: Never Used  . Alcohol use No     Comment: "stopped drinking in 2013 drank a couple beers/day, 2 days/wk"     Lives - At SNF  Mobility - with assistance    Family History :     Family History    Problem Relation Age of Onset  . Hyperlipidemia Mother   . Hypertension Mother   . Hodgkin's lymphoma Father   . Heart disease Sister       Home Medications:   Prior to Admission medications   Medication Sig Start Date End Date Taking? Authorizing Provider  insulin aspart (NOVOLOG) 100 UNIT/ML injection 200-250 give 2 units, 251-300 give 4 units, 301-350 give 6 units, 351-400 give 8 units, More than 400-call M.D. 12/14/15  Yes Maretta Bees, MD  ascorbic acid (VITAMIN C) 500 MG tablet Take 500 mg by mouth 2 (two) times daily.    Historical Provider, MD  aspirin EC 81 MG tablet Take 81 mg by mouth daily.    Historical Provider, MD  cetirizine (ZYRTEC) 10 MG tablet Take 10 mg by mouth daily as needed for allergies.     Historical Provider, MD  fluticasone (FLONASE) 50 MCG/ACT nasal spray Place 1 spray into both nostrils 2 (two) times daily.    Historical Provider, MD  lidocaine-prilocaine (EMLA) cream Apply 1 application topically Every Tuesday,Thursday,and Saturday with dialysis.     Historical Provider, MD  oxyCODONE-acetaminophen (PERCOCET/ROXICET) 5-325 MG tablet Take 1 tablet by mouth every 8 (eight) hours as needed for severe pain. Patient not taking: Reported on 12/30/2015 12/14/15   Maretta Bees, MD  pantoprazole (PROTONIX) 40 MG tablet Take 1 tablet (40 mg total) by mouth daily. Patient not taking: Reported on 12/30/2015 05/13/14   Joseph Art, DO  pravastatin (PRAVACHOL) 20 MG tablet Take 20 mg by mouth every evening.     Historical Provider, MD  promethazine (PHENERGAN) 25 MG tablet Take 25 mg by mouth every 6 (six) hours as needed for nausea or vomiting.    Historical Provider, MD  senna (SENOKOT) 8.6 MG tablet Take 1 tablet by mouth 2 (two) times daily.    Historical Provider, MD  sevelamer carbonate (RENVELA) 800 MG tablet Take 1,600 mg by mouth 3 (three) times daily with meals.    Historical Provider, MD     Allergies:    No Known Allergies   Physical Exam:    Vitals  Blood pressure (!) 101/54, pulse 103, temperature 98.6 F (  37 C), temperature source Oral, resp. rate 14, height 5\' 3"  (1.6 m), weight 36.3 kg (80 lb), SpO2 97 %.   1. General Same frail, cachectic female lying in bed in NAD,   2. Normal affect and insight, Not Suicidal or Homicidal, Awake Alert, Oriented X 3.  3. No F.N deficits, ALL C.Nerves Intact, Motor strength grossly intact   4. Ears and Eyes appear Normal, Conjunctivae clear, PERRLA. Moist Oral Mucosa.  5. Supple Neck, No JVD, No cervical lymphadenopathy appriciated, No Carotid Bruits.  6. Symmetrical Chest wall movement, Good air movement bilaterally, CTAB.  7. RRR, No Gallops, Rubs or Murmurs, No Parasternal Heave.  8. Positive Bowel Sounds, Abdomen Soft, No tenderness, No rebound -guarding or rigidity.  9.  Right foot with gangrene from mid foot extending to her toes, unable to feel pulses, left BKA, sacral pressure ulcer.  10. Significant muscle wasting ,  11. No Palpable Lymph Nodes in Neck or Axillae    Data Review:    CBC  Recent Labs Lab 12/30/15 1616  WBC 19.9*  HGB 8.8*  HCT 26.9*  PLT 316  MCV 82.5  MCH 27.0  MCHC 32.7  RDW 16.7*  LYMPHSABS 0.5*  MONOABS 1.1*  EOSABS 0.1  BASOSABS 0.0   ------------------------------------------------------------------------------------------------------------------  Chemistries   Recent Labs Lab 12/30/15 1616  NA 132*  K 4.6  CL 96*  CO2 24  GLUCOSE 161*  BUN 39*  CREATININE 4.16*  CALCIUM 8.5*   ------------------------------------------------------------------------------------------------------------------ estimated creatinine clearance is 7.1 mL/min (by C-G formula based on SCr of 4.16 mg/dL (H)). ------------------------------------------------------------------------------------------------------------------ No results for input(s): TSH, T4TOTAL, T3FREE, THYROIDAB in the last 72 hours.  Invalid input(s): FREET3  Coagulation  profile No results for input(s): INR, PROTIME in the last 168 hours. ------------------------------------------------------------------------------------------------------------------- No results for input(s): DDIMER in the last 72 hours. -------------------------------------------------------------------------------------------------------------------  Cardiac Enzymes No results for input(s): CKMB, TROPONINI, MYOGLOBIN in the last 168 hours.  Invalid input(s): CK ------------------------------------------------------------------------------------------------------------------ No results found for: BNP   ---------------------------------------------------------------------------------------------------------------  Urinalysis    Component Value Date/Time   COLORURINE RED (A) 12/08/2015 1746   APPEARANCEUR TURBID (A) 12/08/2015 1746   LABSPEC 1.013 12/08/2015 1746   PHURINE 8.5 (H) 12/08/2015 1746   GLUCOSEU NEGATIVE 12/08/2015 1746   HGBUR LARGE (A) 12/08/2015 1746   BILIRUBINUR SMALL (A) 12/08/2015 1746   KETONESUR NEGATIVE 12/08/2015 1746   PROTEINUR >300 (A) 12/08/2015 1746   UROBILINOGEN 0.2 06/25/2014 1436   NITRITE NEGATIVE 12/08/2015 1746   LEUKOCYTESUR LARGE (A) 12/08/2015 1746    ----------------------------------------------------------------------------------------------------------------   Imaging Results:    Dg Foot Complete Right  Result Date: 12/30/2015 CLINICAL DATA:  Necrotic toes of the right foot. EXAM: RIGHT FOOT COMPLETE - 3+ VIEW COMPARISON:  None. FINDINGS: There is extensive arterial vascular calcification in the foot. There is gas in the soft tissues throughout the plantar aspect of the foot, most prominent in the forefoot but extending under the plantar aspect of the calcaneus. No discrete bone destruction or fractures. 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 Ankle joint appears normal. IMPRESSION: Findings of gas gangrene of  the foot. No radiographic evidence of osteomyelitis. Extensive arterial vascular calcifications in the foot and ankle. Electronically Signed   By: Francene Boyers M.D.   On: 12/30/2015 16:37    My personal review of EKG: Pending   Assessment & Plan:    Active Problems:   Anemia of chronic disease   ESRD on hemodialysis (HCC)   Cardiomyopathy, ischemic   DM (diabetes  mellitus) type II controlled with renal manifestation (HCC)   Chronic diastolic congestive heart failure (HCC)   Gas gangrene (HCC)   Gas gangrene - Patient presents with foul smell right foot, and gangrene, patient with evidence of gas gangrene, and was to mellitus, started on IV vancomycin and Zosyn, discussed with vascular surgery Dr Arbie Cookey who Will evaluate the patient when she gets to [facility] for possible need of amputation. - Discussed with patient, granddaughter at bedside, patient wants to proceed with surgery.  End-stage renal disease - On hemodialysis TTS, noncompliant, missed hemodialysis 11/4, consult renal to resume dialysis, discussed with Dr. Juel Burrow.  Diabetes mellitus - Continue with insulin sliding scale, not on Lantus anymore  Dyslipidemia - Continue statin  CAD  - Most recent cath in February 2016 with evidence of three-vessel disease, denies any chest pain or shortness of breath, continue with aspirin  Ischemic cardiac erythema with chronic systolic/diastolic CHF - Volume management with hemodialysis  PAD - Status post left BKA on aspirin daily  Urinary retention - She has long-term Foley  Stage IV sacral ulcer - We'll consult wound  Care  Anemia - Anemia of end-stage disease and chronic illness, hemoglobin 8 16, baseline is around 9, Management  Renal, may need Aranesp or IV iron.  DVT Prophylaxis Heparin   AM Labs Ordered, also please review Full Orders  Family Communication: Admission, patients condition and plan of care including tests being ordered have been discussed with  the patient and Granddaughter who is HCPOA indicate understanding and agree with the plan and Code Status.  Code Status DNR  Likely DC to  SNF  Condition GUARDED /Poor  Consults called: Vascular dr Arbie Cookey, renal Dr Juel Burrow  Admission status: Inpatient  Time spent in minutes : 65 Minutes  Tyniya Kuyper M.D on 12/30/2015 at 6:26 PM  Between 7am to 7pm - Pager - [phone]. After 7pm go to [email or web address] - password [facility]  [facility] - Office  [phone]

## 2015-12-30 NOTE — ED Triage Notes (Signed)
Per EMS, pt is from [facility].  Pt presents with some redness and swelling of the RLE.  Per note from facility, there is concern that the toes are fused and they were concerned of new infection per the appearance and odor of the foot.  Pt is alert and oriented x 4.    RR: 20  HR: 100 BP: 100/50 CBG: 248 100%

## 2015-12-30 NOTE — Progress Notes (Signed)
Pt was reclined in bed when I arrived; she was a wake and alert. She said she felt ok and drew my attn to her foot (which is why she is here). When asked how I may help her, she asked for prayer. Her granddaughter joined [place] during the end of the prayer. She had been visibly tearful. She said she is moving to [facility]. Offered additional [facility] spt at [facility] if needed. Please page if additional assistance is needed. Chaplain Marjory Lies, M.Div.   12/30/15 1800  Clinical Encounter Type  Visited With Patient and family together

## 2015-12-30 NOTE — Progress Notes (Signed)
Patient received via Care link from [facility] ED. Oriented to room, Skin assessment and Tele monitoring completed. Patient cleaned up and barrier cream applied. Grand daughters at bedside. Call light within reach.

## 2015-12-30 NOTE — ED Notes (Signed)
Bed: TM22 Expected date:  Expected time:  Means of arrival:  Comments: EMS- necrotic foot

## 2015-12-30 NOTE — Consult Note (Signed)
Vascular and Vein Specialist of [facility]  Patient name: Elizabeth [name] MRN: [id number] DOB: June 18, 1945 Sex: female  REASON FOR CONSULT: Gas gangrene right foot  HPI: Elizabeth [name] is a 70 y.o. female, who is well known to our service. She had extensive prevascular" attempts by Dr. Darrick Penna in January 2016 on her left leg. She eventually had the failure of this and underwent left below-knee amputation by myself in March 2016. She healed this without difficulty. She has had multiple recent hospitalizations with sepsis and other issues of failing health. She has long-standing end-stage renal disease and severe insulin-dependent diabetes. In a recent admission for sepsis 2 weeks ago there was some discussion regarding withdrawing hemodialysis with palliative care only. This discussion was ongoing. The patient presented to the [facility] long emergency room this evening after she was noted to have tenderness changes in her right foot at her nursing facility. Evaluation there revealed gas gangrene and she is transferred to [facility] for ongoing dialysis and surgical treatment. She is awake and alert and understands the discussion. She does not want to stop dialysis and does want to consider amputation as a lifesaving method  Past Medical History:  Diagnosis Date  . Acute hyperkalemia 12/24/2013  . Anemia   . Arthritis of knee, right    "right knee" (06/12/2014)  . Coronary artery disease, non-occlusive January 2016   Moderate p&mLAD & Ostial OM; Myoview Nuclear Stress Test 03/21/2014: EF 35%. Mild hypokinesis of the inferior wall with fixed inferior defect suggesting scar. (no culprit lesion on CATH)  . Critical lower limb ischemia    Status post left BKA following left popliteal and peroneal artery endarterectomy with patch angioplasty.  . Diabetes mellitus, insulin dependent (IDDM), uncontrolled (HCC)    Brittle; has h/o Hypo & Hyperglycemic episodes, Type  II  . Diabetic neuropathy (HCC)   . ESRD (end stage renal disease) (HCC)    [address], [place], S (06/12/2014)  . Foley catheter in place    for urinary retention  . Foot infection   . Gout    "was in my LLE"  . Hematemesis April 2016   Hospitalized  . History of CVA (cerebrovascular accident) 08/19/2013   "minor one"  . Hyperlipidemia with target LDL less than 70   . Loss of consciousness (HCC) 08/19/2013  . 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 Urinary retention    DX NEUROGENIC BLADDER--HAS INDWELLING FOLEY CATHETER  . UTI (urinary tract infection) 10/17/2013    Family History  Problem Relation Age of Onset  . Hyperlipidemia Mother   . Hypertension Mother   . Hodgkin's lymphoma Father   . Heart disease Sister     SOCIAL HISTORY: Social History   Social History  . Marital status: Single    Spouse name: N/A  . Number of children: N/A  . Years of education: N/A   Occupational History  . disabled    Social History Main Topics  . Smoking status: Never Smoker  . Smokeless tobacco: Never Used  . Alcohol use No     Comment: "stopped drinking in 2013 drank a couple beers/day, 2 days/wk"  . Drug use: No  . Sexual activity: No   Other Topics Concern  . Not on file   Social History Narrative   Granddaughter is contact  [facility]     No Known Allergies  Current Facility-Administered Medications  Medication Dose Route Frequency Provider Last Rate Last Dose  . piperacillin-tazobactam (ZOSYN) IVPB 3.375 g  3.375 g Intravenous Once Azalia BilisKevin Campos, MD 12.5 mL/hr at 12/30/15 1839 3.375 g at 12/30/15 1839  . vancomycin (VANCOCIN) IVPB 1000 mg/200 mL premix  1,000 mg Intravenous Once Azalia BilisKevin Campos, MD 200 mL/hr at 12/30/15 1858 1,000 mg at 12/30/15 1858    REVIEW OF  SYSTEMS:  Reviewed in her record with nothing to add other than her past medical and history   PHYSICAL EXAM: Vitals:   12/30/15 1442 12/30/15 1449 12/30/15 1815  BP: 104/61  (!) 101/54  Pulse: 98  103  Resp: 18  14  Temp: 98.6 F (37 C)    TempSrc: Oral    SpO2: 100%  97%  Weight:  80 lb (36.3 kg)   Height:  5\' 3"  (1.6 m)     GENERAL: The patient is a well-nourished female, in no acute distress. The vital signs are documented above. CARDIOVASCULAR: Palpable right femoral pulse absent right popliteal and distal pulses PULMONARY: There is good air exchange  ABDOMEN: Soft and non-tender  MUSCULOSKELETAL: Well-healed left below-knee amputation NEUROLOGIC: No focal weakness or paresthesias are detected. SKIN: Extensive gangrenous changes over her entire forefoot. Mild tenderness in her calf PSYCHIATRIC: The patient has a normal affect.  DATA:  Plain films of her foot revealed gas in her foot  MEDICAL ISSUES: I discussed the options with patient. Explained that her only option would be amputation. Feel that she would be very high risk for not healing of below-knee amputation and this would provide her no additional benefit from a rehabilitation standpoint. Have recommended right below-knee amputation. She understands and wished to proceed tomorrow. Dr. Imogene Burnhen is available. She knows him from prior evaluation and treatment of her AV access.   Larina Earthlyodd F. Daun Rens, MD FACS [facility] Office Tel [phone] Pager [phone]

## 2015-12-30 NOTE — ED Provider Notes (Signed)
[facility]-EMERGENCY DEPT Provider Note   CSN: [id number] Arrival date & time: 12/30/15  1435     History   Chief Complaint No chief complaint on file.   HPI Elizabeth [name] is a 70 y.o. female.  HPI Patient presents to the emergency department with increased seeing discoloration and necrosis of the toes on her right foot.  She is 70 years old and has end-stage renal disease.  She has a prior left BKA by Dr. Arbie CookeyEarly after severe peripheral arterial disease noted of the left lower extremity.  She lives at a nursing facility was seen by the physician and sent to the ER for further evaluation for possible infection of the right foot.  She presents with gangrenous second through fifth toes.     Past Medical History:  Diagnosis Date  . Acute hyperkalemia 12/24/2013  . Anemia   . Arthritis of knee, right    "right knee" (06/12/2014)  . Coronary artery disease, non-occlusive January 2016   Moderate p&mLAD & Ostial OM; Myoview Nuclear Stress Test 03/21/2014: EF 35%. Mild hypokinesis of the inferior wall with fixed inferior defect suggesting scar. (no culprit lesion on CATH)  . Critical lower limb ischemia    Status post left BKA following left popliteal and peroneal artery endarterectomy with patch angioplasty.  . Diabetes mellitus, insulin dependent (IDDM), uncontrolled (HCC)    Brittle; has h/o Hypo & Hyperglycemic episodes, Type II  . Diabetic neuropathy (HCC)   . ESRD (end stage renal disease) (HCC)    [address], [place], [place] (06/12/2014)  . Foley catheter in place    for urinary retention  . Foot infection   . Gout    "was in my LLE"  . Hematemesis April 2016   Hospitalized  . History of CVA (cerebrovascular accident) 08/19/2013   "minor one"  . Hyperlipidemia with target LDL less than 70   . Loss of consciousness (HCC) 08/19/2013  . 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 Urinary retention    DX NEUROGENIC BLADDER--HAS INDWELLING FOLEY CATHETER  . UTI (urinary tract infection) 10/17/2013    Patient Active Problem List   Diagnosis Date Noted  . Palliative care encounter   . Sepsis (HCC) 12/08/2015  . Chronic diastolic congestive heart failure (HCC) 08/06/2015  . Dyslipidemia associated with type 2 diabetes mellitus (HCC) 03/16/2015  . 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 Venous stenosis of left upper extremity 02/06/2015  . 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 DM (diabetes mellitus) type II controlled with renal manifestation (HCC) 06/30/2014  . S/P BKA (below knee amputation) unilateral (HCC) 05/28/2014  . Peripheral artery disease (HCC) 04/16/2014  . Moderate 3V CAD at cath 03/24/14   . Cardiomyopathy, ischemic   . Neuropathy (HCC) 03/17/2014  . Protein-calorie malnutrition, severe (HCC) 12/27/2013  . ESRD on hemodialysis (HCC) 10/17/2013  . Hypoglycemia 08/19/2013  . Hypothermia 04/24/2012  . Anemia of chronic disease 04/24/2012    Past Surgical History:  Procedure Laterality Date  . ABDOMINAL AORTAGRAM N/A 03/19/2014   Procedure: ABDOMINAL Ronny FlurryAORTAGRAM;  Surgeon: Sherren Kernsharles E Fields, MD;  [facility];  Service: Cardiovascular;  Laterality: N/A;  . AMPUTATION Left 03/26/2014   Procedure: AMPUTATION SECOND LEFT TOE;  Surgeon: Sherren Kernsharles E Fields, MD;  [facility];  Service: Vascular;  Laterality: Left;  .  AMPUTATION Left 05/10/2014   Procedure: AMPUTATION BELOW KNEE;  Surgeon: Larina Earthly, MD;  Location: [facility] OR;  Service: Vascular;  Laterality: Left;  . ANGIOPLASTY Left 04/29/2013   Procedure: ANGIOPLASTY;  Surgeon: Chuck Hint, MD;  Location: [facility] CATH LAB;  Service: Cardiovascular;  Laterality: Left;  . APPLICATION OF A-CELL OF EXTREMITY N/A 10/29/2014   Procedure: PLACEMENT OF APPLICATION OF A-CELL;  Surgeon:  Wayland Denis, DO;  Location: [facility] OR;  Service: Plastics;  Laterality: N/A;  . AV FISTULA PLACEMENT Left 04/28/2014   Procedure: CREATION OF LEFT BRACHIOCEPHALIC  ARTERIOVENOUS (AV) FISTULA CREATION;  Surgeon: Fransisco Hertz, MD;  Location: [facility] OR;  Service: Vascular;  Laterality: Left;  . BASCILIC VEIN TRANSPOSITION Left 10/30/2012   Procedure: LEFT 1ST STAGE BASCILIC VEIN TRANSPOSITION;  Surgeon: Chuck Hint, MD;  Location[facility] OR;  Service: Vascular;  Laterality: Left;  . BASCILIC VEIN TRANSPOSITION Left 02/05/2013   Procedure: BASCILIC VEIN TRANSPOSITION 2ND STAGE;  Surgeon: Chuck Hint, MD;  Location: [facility] OR;  Service: Vascular;  Laterality: Left;  . BELOW KNEE LEG AMPUTATION Left   . ENDARTERECTOMY POPLITEAL Left 03/26/2014   Procedure: Left popliteal and peroneal artery endarterectomy with vein patch angioplasty;  Surgeon: Sherren Kerns, MD;  Location: [facility] OR;  Service: Vascular;  Laterality: Left;  . ESOPHAGEAL MANOMETRY N/A 11/24/2014   Procedure: ESOPHAGEAL MANOMETRY (EM);  Surgeon: Jeani Hawking, MD;  Location: [facility] ENDOSCOPY;  Service: Endoscopy;  Laterality: N/A;  . ESOPHAGOGASTRODUODENOSCOPY N/A 06/13/2014   Procedure: ESOPHAGOGASTRODUODENOSCOPY (EGD);  Surgeon: Jeani Hawking, MD;  Location: [facility] ENDOSCOPY;  Service: Endoscopy;  Laterality: N/A;  . FISTULOGRAM Left 04/29/2013   Procedure: FISTULOGRAM;  Surgeon: Chuck Hint, MD;  Location: [facility] CATH LAB;  Service: Cardiovascular;  Laterality: Left;  . FISTULOGRAM Left 10/08/2014   Procedure: LEFT ARM FISTULOGRAM;  Surgeon: Fransisco Hertz, MD;  Location: [facility] OR;  Service: Vascular;  Laterality: Left;  . INCISION AND DRAINAGE OF WOUND N/A 10/29/2014   Procedure: IRRIGATION AND DEBRIDEMENT OF SACRAL WOUND, PLACEMENT OF A-CELL;  Surgeon: Wayland Denis, DO;  Location: [facility] OR;  Service: Plastics;  Laterality: N/A;  . INSERTION OF SUPRAPUBIC CATHETER N/A 05/16/2012   Procedure: INSERTION OF SUPRAPUBIC CATHETER;  Surgeon: Sebastian Ache,  MD;  Location: [facility] ORS;  Service: Urology;  Laterality: N/A;  . LEFT AND RIGHT HEART CATHETERIZATION WITH CORONARY ANGIOGRAM N/A 03/24/2014   Procedure: LEFT AND RIGHT HEART CATHETERIZATION WITH CORONARY ANGIOGRAM;  Surgeon: Marykay Lex, MD;  Location: [facility] CATH LAB;  Service: Cardiovascular; Moderate disease in p-mLAD & Ostial OM1; normal right heart pressures. Wedge pressure 29 mmHg. --No lesion to explain inferior defect on Myoview and echocardiogram.   . NM MYOCAR PERF WALL MOTION  04/19/2012/ 02/2014   a) low risk study-EF 44%,mild inferolateral hypkinesis; b) EF 35%. Mild hypokinesis of the inferior wall with fixed inferior defect suggesting scar.  Marland Kitchen RADIOLOGY WITH ANESTHESIA N/A 10/25/2013   Procedure: RADIOLOGY WITH ANESTHESIA;  Surgeon: Medication Radiologist, MD;  Location: [facility] OR;  Service: Radiology;  Laterality: N/A;  . TRANSTHORACIC ECHOCARDIOGRAM  03/22/2014   EF 30-35%, mild LVH. Severe inferior hypokinesis. Mild MR.  Marland Kitchen UPPER GI ENDOSCOPY  03/06/2015   Procedure: UPPER GI ENDOSCOPY;  Surgeon: Karie Soda, MD;  Location: [facility] ORS;  Service: General;;  . VENOPLASTY Left 10/08/2014   Procedure: LEFT CEPHALIC VEIN VENOPLASTY;  Surgeon: Fransisco Hertz, MD;  Location: [facility] OR;  Service: Vascular;  Laterality: Left;    OB History  No data available       Home Medications    Prior to Admission medications   Medication Sig Start Date End Date Taking? Authorizing Provider  insulin aspart (NOVOLOG) 100 UNIT/ML injection 200-250 give 2 units, 251-300 give 4 units, 301-350 give 6 units, 351-400 give 8 units, More than 400-call M.D. 12/14/15  Yes Maretta Bees, MD  ascorbic acid (VITAMIN C) 500 MG tablet Take 500 mg by mouth 2 (two) times daily.    Historical Provider, MD  aspirin EC 81 MG tablet Take 81 mg by mouth daily.    Historical Provider, MD  cetirizine (ZYRTEC) 10 MG tablet Take 10 mg by mouth daily as needed for allergies.     Historical Provider, MD  fluticasone (FLONASE) 50  MCG/ACT nasal spray Place 1 spray into both nostrils 2 (two) times daily.    Historical Provider, MD  lidocaine-prilocaine (EMLA) cream Apply 1 application topically Every Tuesday,Thursday,and Saturday with dialysis.     Historical Provider, MD  oxyCODONE-acetaminophen (PERCOCET/ROXICET) 5-325 MG tablet Take 1 tablet by mouth every 8 (eight) hours as needed for severe pain. Patient not taking: Reported on 12/30/2015 12/14/15   Maretta Bees, MD  pantoprazole (PROTONIX) 40 MG tablet Take 1 tablet (40 mg total) by mouth daily. Patient not taking: Reported on 12/30/2015 05/13/14   Joseph Art, DO  pravastatin (PRAVACHOL) 20 MG tablet Take 20 mg by mouth every evening.     Historical Provider, MD  promethazine (PHENERGAN) 25 MG tablet Take 25 mg by mouth every 6 (six) hours as needed for nausea or vomiting.    Historical Provider, MD  senna (SENOKOT) 8.6 MG tablet Take 1 tablet by mouth 2 (two) times daily.    Historical Provider, MD  sevelamer carbonate (RENVELA) 800 MG tablet Take 1,600 mg by mouth 3 (three) times daily with meals.    Historical Provider, MD    Family History Family History  Problem Relation Age of Onset  . Hyperlipidemia Mother   . Hypertension Mother   . Hodgkin's lymphoma Father   . Heart disease Sister     Social History Social History  Substance Use Topics  . Smoking status: Never Smoker  . Smokeless tobacco: Never Used  . Alcohol use No     Comment: "stopped drinking in 2013 drank a couple beers/day, 2 days/wk"     Allergies   Patient has no known allergies.   Review of Systems Review of Systems  All other systems reviewed and are negative.    Physical Exam Updated Vital Signs BP 104/61 (BP Location: Right Arm)   Pulse 98   Temp 98.6 F (37 C) (Oral)   Resp 18   Ht 5\' 3"  (1.6 m)   Wt 80 lb (36.3 kg)   SpO2 100%   BMI 14.17 kg/m   Physical Exam  Constitutional: She appears well-developed and well-nourished.  HENT:  Head: Normocephalic.    Eyes: EOM are normal.  Neck: Normal range of motion.  Cardiovascular: Normal rate.   Pulmonary/Chest: Effort normal.  Abdominal: She exhibits no distension.  Musculoskeletal:  Obvious necrotic and foul-smelling tissue of her second and through fifth toes on the right foot.  Warmth and erythema coming across the dorsum of the right foot.  Right foot does feel perfused.  There is needing of the skin on the polar surface of the right foot.  No obvious drainage at this time  Neurological: She is alert.  Psychiatric: She has a normal mood and affect.  Nursing note and vitals reviewed.    ED Treatments / Results  Labs (all labs ordered are listed, but only abnormal results are displayed) Labs Reviewed  CBC WITH DIFFERENTIAL/PLATELET - Abnormal; Notable for the following:       Result Value   WBC 19.9 (*)    RBC 3.26 (*)    Hemoglobin 8.8 (*)    HCT 26.9 (*)    RDW 16.7 (*)    Neutro Abs 18.2 (*)    Lymphs Abs 0.5 (*)    Monocytes Absolute 1.1 (*)    All other components within normal limits  BASIC METABOLIC PANEL - Abnormal; Notable for the following:    Sodium 132 (*)    Chloride 96 (*)    Glucose, Bld 161 (*)    BUN 39 (*)    Creatinine, Ser 4.16 (*)    Calcium 8.5 (*)    GFR calc non Af Amer 10 (*)    GFR calc Af Amer 11 (*)    All other components within normal limits    EKG  EKG Interpretation None       Radiology Dg Foot Complete Right  Result Date: 12/30/2015 CLINICAL DATA:  Necrotic toes of the right foot. EXAM: RIGHT FOOT COMPLETE - 3+ VIEW COMPARISON:  None. FINDINGS: There is extensive arterial vascular calcification in the foot. There is gas in the soft tissues throughout the plantar aspect of the foot, most prominent in the forefoot but extending under the plantar aspect of the calcaneus. No discrete bone destruction or fractures. 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 Ankle joint appears normal. IMPRESSION: Findings of gas  gangrene of the foot. No radiographic evidence of osteomyelitis. Extensive arterial vascular calcifications in the foot and ankle. Electronically Signed   By: Francene Boyers M.D.   On: 12/30/2015 16:37    Procedures Procedures (including critical care time)  Medications Ordered in ED Medications  piperacillin-tazobactam (ZOSYN) IVPB 3.375 g (not administered)  vancomycin (VANCOCIN) IVPB 1000 mg/200 mL premix (not administered)     Initial Impression / Assessment and Plan / ED Course  I have reviewed the triage vital signs and the nursing notes.  Pertinent labs & imaging results that were available during my care of the patient were reviewed by me and considered in my medical decision making (see chart for details).  Clinical Course     Patient appears to have gas-forming infection of the right foot.  No obvious signs of osteomyelitis.  The most aggressive treatment would be IV antibiotics and early consultation for amputation.  The patient has end-stage renal disease and has been missing multiple dialysis sessions.  The patient's granddaughter who is the healthcare power of attorney states that hospice has recently been involved in the family has been considering palliative measures anyways as the patient seems to be giving up on her dialysis.  At this time the healthcare power of attorney does not want consultation for amputation.  She would like admission the hospital with IV antibiotics of height of care consult in the morning to define care.  The healthcare power of attorney understands that her infection could become significantly worse in this short amount of time without surgical consultation/amputation/debridement.  Patient will need to be admitted to [facility] as she is a dialysis patient  Final Clinical Impressions(s) / ED Diagnoses   Final diagnoses:  Gas gangrene of foot (HCC)  Cellulitis of right foot  ESRD on dialysis [facility])    New Prescriptions  New  Prescriptions   No medications on file     Azalia Bilis, MD 12/30/15 1750

## 2015-12-30 NOTE — Progress Notes (Addendum)
VASCULAR LAB PRELIMINARY  ARTERIAL  ABI completed: Right ABI non compressible with biphasic waveforms noted. No waveform of right toe established on PPG and therefore could not obtain pressure.    RIGHT    LEFT    PRESSURE WAVEFORM  PRESSURE WAVEFORM  BRACHIAL 103 Tri BRACHIAL N/A restricted arm   AT 255 Bi AT N/A   PT 255 Bi PT N/A   GREAT TOE absent NA GREAT TOE N/A NA    RIGHT LEFT  ABI Non compressible N/A post BKA    Farrel Demark, RDMS, RVT   12/30/2015, 4:53 PM

## 2015-12-30 NOTE — Progress Notes (Signed)
Pharmacy Antibiotic Note  Elizabeth [name] is a 70 y.o. female with PVD requiring BKA of L leg in 2016, ESRD on HD TTS, and DM; admitted on 12/30/2015 with gas gangrene of her R foot.  Pharmacy has been consulted for vancomycin and Zosyn dosing. Pt transferred to [facility] d/t need for dialysis; first doses given in [facility] ED.  Plan:  Vancomycin loading dose is sufficient given low patient weight; f/u for resumption of HD schedule to enter additional vancomycin doses; check VT as needed  Zosyn 3.375 g IV q12 hr by 4-hr infusion (give after HD)   Height: 5\' 3"  (160 cm) Weight: 80 lb (36.3 kg) IBW/kg (Calculated) : 52.4  Temp (24hrs), Avg:98.6 F (37 C), Min:98.6 F (37 C), Max:98.6 F (37 C)   Recent Labs Lab 12/30/15 1616  WBC 19.9*  CREATININE 4.16*    Estimated Creatinine Clearance: 7.1 mL/min (by C-G formula based on SCr of 4.16 mg/dL (H)).    No Known Allergies  Antimicrobials this admission: Vancomycin 11/8 >>  Zosyn 11/8 >>   Dose adjustments this admission: ---  Microbiology results: No cultures ordered at this time  Thank you for allowing pharmacy to be a part of this patient's care.  Bernadene Person, PharmD, BCPS Pager: [phone] 12/30/2015, 8:03 PM

## 2015-12-30 NOTE — Consult Note (Signed)
Reason for Consult:ESRD Referring Physician: Dr. Waldron Labs  Chief Complaint: Right foot wounds  Assessment/Plan: 1 Gangrene of the right foot - per Dr. Donnetta Hutching will require an amputation (AKA vs BKA - Dr. Donnetta Hutching felt pt will be at high risk to have a nonhealing wound with a BKA). Started on Vancomycin and Zosyn. 2 ESRD: TTS @ [facility] w/ Dr. Marval Regal; she missed dialysis on Sat with last treatment on Tuesday. EDW is only 35kg and she was being considered for palliative care but she was not ready to transition over 2 weeks ago during a hospitalization for sepsis. - pt to have an amputation tomorrow; therefore will try to hold off hd till Fri.  3 Hypertension: Controlled. 4. Anemia of ESRD: Stable. 5. Metabolic Bone Disease: Will check a phos and manage the iPTH as an outpt.  Dialyzes at [facility] on TTS Primary Nephrologist Dr. Marval Regal EDW 35kg Heparin 1500 units Access lt BCF  HPI: Elizabeth [name] is a 70 y.o. female ESRD TTS [facility] w/ Dr. Geanie Kenning with last HD Tuesday after missing treatment on Sat. She was recently hospitalized with sepsis and has h/o of a left BKA by Dr. Donnetta Hutching 04/2014 after numerous inteventions. She was sent from the SNF with worsened necrosis of her 3 middle toes on her right foot over the past 2 weeks since discharge from the hospital. Palliative care was considered during the last hospitalization but the patient was not ready to transition over. 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 Seen by Dr. Donnetta Hutching with recommendation of amputation. She was also started on broad spectrum antibiotics in the ED. According to the daughter the pt's appetite has been very poor.    Past Medical History:  Diagnosis Date  . Acute hyperkalemia 12/24/2013  . Anemia   . Arthritis of knee, right    "right knee" (06/12/2014)  . Chronic indwelling Foley catheter    "it's been in since ~ 2014" (12/30/2015)  . Coronary artery  disease, non-occlusive January 2016   Moderate p&mLAD & Ostial OM; Myoview Nuclear Stress Test 03/21/2014: EF 35%. Mild hypokinesis of the inferior wall with fixed inferior defect suggesting scar. (no culprit lesion on CATH)  . Critical lower limb ischemia    Status post left BKA following left popliteal and peroneal artery endarterectomy with patch angioplasty.  . Diabetes mellitus, insulin dependent (IDDM), uncontrolled ([place])    [place]; has h/o Hypo & Hyperglycemic episodes, Type II  . Diabetic neuropathy ([place])   . ESRD (end stage renal disease) on dialysis [facility])    [place], [place] (12/30/2015)  . Foley catheter in place    for urinary retention  . Foot infection   . Gout    "was in my LLE"  . Hematemesis April 2016   Hospitalized  . History of CVA (cerebrovascular accident) 08/19/2013   "minor one"  . Hyperlipidemia with target LDL less than 70   . Loss of consciousness ([place]) 08/19/2013  . Peripheral vascular disease of lower extremity with ulceration ([place]) January-March 2016   PV Angiogram 03/19/2014: Bilateral AP and PT artery occlusions, 90% Left peroneal artery with single-vessel Right peroneal and flow  . Urinary retention    DX NEUROGENIC BLADDER--HAS INDWELLING FOLEY CATHETER  . UTI (urinary tract infection) 10/17/2013     Allergies: No Known Allergies  Past Surgical History:  Procedure Laterality Date  . ABDOMINAL AORTAGRAM N/A 03/19/2014  Procedure: ABDOMINAL AORTAGRAM;  Surgeon: Elam Dutch, MD;  Location: [facility] CATH LAB;  Service: Cardiovascular;  Laterality: N/A;  . AMPUTATION Left 03/26/2014   Procedure: AMPUTATION SECOND LEFT TOE;  Surgeon: Elam Dutch, MD;  Location: [place];  Service: Vascular;  Laterality: Left;  . AMPUTATION Left 05/10/2014   Procedure: AMPUTATION BELOW KNEE;  Surgeon: Rosetta Posner, MD;  Location: [place];  Service: Vascular;  Laterality: Left;  . ANGIOPLASTY Left 04/29/2013   Procedure: ANGIOPLASTY;  Surgeon: Angelia Mould, MD;   Location: [facility] CATH LAB;  Service: Cardiovascular;  Laterality: Left;  . APPLICATION OF A-CELL OF EXTREMITY N/A 10/29/2014   Procedure: PLACEMENT OF APPLICATION OF A-CELL;  Surgeon: Theodoro Kos, DO;  Location: [place];  Service: Plastics;  Laterality: N/A;  . AV FISTULA PLACEMENT Left 04/28/2014   Procedure: CREATION OF LEFT BRACHIOCEPHALIC  ARTERIOVENOUS (AV) FISTULA CREATION;  Surgeon: Conrad Curtisville, MD;  Location: [place];  Service: Vascular;  Laterality: Left;  . BASCILIC VEIN TRANSPOSITION Left 10/30/2012   Procedure: LEFT 1ST STAGE BASCILIC VEIN TRANSPOSITION;  Surgeon: Angelia Mould, MD;  Location: [place];  Service: Vascular;  Laterality: Left;  . BASCILIC VEIN TRANSPOSITION Left 02/05/2013   Procedure: BASCILIC VEIN TRANSPOSITION 2ND STAGE;  Surgeon: Angelia Mould, MD;  Location: [place];  Service: Vascular;  Laterality: Left;  . BELOW KNEE LEG AMPUTATION Left   . ENDARTERECTOMY POPLITEAL Left 03/26/2014   Procedure: Left popliteal and peroneal artery endarterectomy with vein patch angioplasty;  Surgeon: Elam Dutch, MD;  Location: [place];  Service: Vascular;  Laterality: Left;  . ESOPHAGEAL MANOMETRY N/A 11/24/2014   Procedure: ESOPHAGEAL MANOMETRY (EM);  Surgeon: Carol Ada, MD;  Location: [facility];  Service: Endoscopy;  Laterality: N/A;  . ESOPHAGOGASTRODUODENOSCOPY N/A 06/13/2014   Procedure: ESOPHAGOGASTRODUODENOSCOPY (EGD);  Surgeon: Carol Ada, MD;  Location: [facility] ENDOSCOPY;  Service: Endoscopy;  Laterality: N/A;  . FISTULOGRAM Left 04/29/2013   Procedure: FISTULOGRAM;  Surgeon: Angelia Mould, MD;  Location: [facility] CATH LAB;  Service: Cardiovascular;  Laterality: Left;  . FISTULOGRAM Left 10/08/2014   Procedure: LEFT ARM FISTULOGRAM;  Surgeon: Conrad Mableton, MD;  Location: [place];  Service: Vascular;  Laterality: Left;  . INCISION AND DRAINAGE OF WOUND N/A 10/29/2014   Procedure: IRRIGATION AND DEBRIDEMENT OF SACRAL WOUND, PLACEMENT OF A-CELL;  Surgeon: Theodoro Kos, DO;   Location: [place];  Service: Plastics;  Laterality: N/A;  . INSERTION OF SUPRAPUBIC CATHETER N/A 05/16/2012   Procedure: INSERTION OF SUPRAPUBIC CATHETER;  Surgeon: Alexis Frock, MD;  Location: [facility];  Service: Urology;  Laterality: N/A;  . LEFT AND RIGHT HEART CATHETERIZATION WITH CORONARY ANGIOGRAM N/A 03/24/2014   Procedure: LEFT AND RIGHT HEART CATHETERIZATION WITH CORONARY ANGIOGRAM;  Surgeon: Leonie Man, MD;  Location: [facility] CATH LAB;  Service: Cardiovascular; Moderate disease in p-mLAD & Ostial OM1; normal right heart pressures. Wedge pressure 29 mmHg. --No lesion to explain inferior defect on Myoview and echocardiogram.   . NM MYOCAR PERF WALL MOTION  04/19/2012/ 02/2014   a) low risk study-EF 44%,mild inferolateral hypkinesis; b) EF 35%. Mild hypokinesis of the inferior wall with fixed inferior defect suggesting scar.  Marland Kitchen RADIOLOGY WITH ANESTHESIA N/A 10/25/2013   Procedure: RADIOLOGY WITH ANESTHESIA;  Surgeon: Medication Radiologist, MD;  Location: [place];  Service: Radiology;  Laterality: N/A;  . TRANSTHORACIC ECHOCARDIOGRAM  03/22/2014   EF 30-35%, mild LVH. Severe inferior hypokinesis. Mild MR.  Marland Kitchen UPPER GI ENDOSCOPY  03/06/2015   Procedure: UPPER  GI ENDOSCOPY;  Surgeon: Michael Boston, MD;  Location: [facility];  Service: General;;  . VENOPLASTY Left 10/08/2014   Procedure: LEFT CEPHALIC VEIN VENOPLASTY;  Surgeon: Conrad Springhill, MD;  Location: [facility] OR;  Service: Vascular;  Laterality: Left;    Family History  Problem Relation Age of Onset  . Hyperlipidemia Mother   . Hypertension Mother   . Hodgkin's lymphoma Father   . Heart disease Sister     Social History:  reports that she has never smoked. She has never used smokeless tobacco. She reports that she does not drink alcohol or use drugs.  ROS Pertinent items are noted in HPI.  Blood pressure (!) 101/54, pulse 103, temperature 98.6 F (37 C), temperature source Oral, resp. rate 14, height 5' 3"  (1.6 m), weight 36.3 kg (80 lb), SpO2 97  %. General appearance: alert, cooperative and cachectic Eyes: negative Neck: no adenopathy, no carotid bruit, no JVD, supple, symmetrical, trachea midline and thyroid not enlarged, symmetric, no tenderness/mass/nodules Back: sacral decub Resp: clear to auscultation bilaterally Chest wall: no tenderness Cardio: regular rate and rhythm, S1, S2 normal, no murmur, click, rub or gallop GI: soft, non-tender; bowel sounds normal; no masses,  no organomegaly Extremities: Lt BKA w/ healed wound, right 2-4th digits with gangrene as well as the 1st digit tip + plantar aspect involvement; foul smell Pulses: Unable to palpate the right DP Skin: Skin color, texture, turgor normal. No rashes or lesions Lymph nodes: Cervical, supraclavicular, and axillary nodes normal. Neurologic: Grossly normal  Results for orders placed or performed during the hospital encounter of 12/30/15 (from the past 48 hour(s))  CBC with Differential/Platelet     Status: Abnormal   Collection Time: 12/30/15  4:16 PM  Result Value Ref Range   WBC 19.9 (H) 4.0 - 10.5 K/uL   RBC 3.26 (L) 3.87 - 5.11 MIL/uL   Hemoglobin 8.8 (L) 12.0 - 15.0 g/dL   HCT 26.9 (L) 36.0 - 46.0 %   MCV 82.5 78.0 - 100.0 fL   MCH 27.0 26.0 - 34.0 pg   MCHC 32.7 30.0 - 36.0 g/dL   RDW 16.7 (H) 11.5 - 15.5 %   Platelets 316 150 - 400 K/uL   Neutrophils Relative % 92 %   Neutro Abs 18.2 (H) 1.7 - 7.7 K/uL   Lymphocytes Relative 2 %   Lymphs Abs 0.5 (L) 0.7 - 4.0 K/uL   Monocytes Relative 6 %   Monocytes Absolute 1.1 (H) 0.1 - 1.0 K/uL   Eosinophils Relative 0 %   Eosinophils Absolute 0.1 0.0 - 0.7 K/uL   Basophils Relative 0 %   Basophils Absolute 0.0 0.0 - 0.1 K/uL  Basic metabolic panel     Status: Abnormal   Collection Time: 12/30/15  4:16 PM  Result Value Ref Range   Sodium 132 (L) 135 - 145 mmol/L   Potassium 4.6 3.5 - 5.1 mmol/L   Chloride 96 (L) 101 - 111 mmol/L   CO2 24 22 - 32 mmol/L   Glucose, Bld 161 (H) 65 - 99 mg/dL   BUN 39 (H) 6  - 20 mg/dL   Creatinine, Ser 4.16 (H) 0.44 - 1.00 mg/dL   Calcium 8.5 (L) 8.9 - 10.3 mg/dL   GFR calc non Af Amer 10 (L) >60 mL/min   GFR calc Af Amer 11 (L) >60 mL/min    Comment: (NOTE) The eGFR has been calculated using the CKD EPI equation. This calculation has not been validated in all clinical situations. eGFR's  persistently <60 mL/min signify possible Chronic Kidney Disease.    Anion gap 12 5 - 15    Dg Foot Complete Right  Result Date: 12/30/2015 CLINICAL DATA:  Necrotic toes of the right foot. EXAM: RIGHT FOOT COMPLETE - 3+ VIEW COMPARISON:  None. FINDINGS: There is extensive arterial vascular calcification in the foot. There is gas in the soft tissues throughout the plantar aspect of the foot, most prominent in the forefoot but extending under the plantar aspect of the calcaneus. No discrete bone destruction or fractures. 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 Ankle joint appears normal. 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 No radiographic evidence of osteomyelitis. Extensive arterial vascular calcifications in the foot and ankle. Electronically Signed   By: Lorriane Shire M.D.   On: 12/30/2015 16:37     Dwana Melena, MD 12/30/2015, 8:35 PM

## 2015-12-31 ENCOUNTER — Inpatient Hospital Stay (HOSPITAL_COMMUNITY): Payer: Medicare Other | Admitting: Anesthesiology

## 2015-12-31 ENCOUNTER — Encounter (HOSPITAL_COMMUNITY): Payer: Self-pay | Admitting: Anesthesiology

## 2015-12-31 ENCOUNTER — Encounter (HOSPITAL_COMMUNITY)
Admission: EM | Disposition: A | Discharge status: Skilled Nursing Facility | Payer: Self-pay | Source: Home / Self Care | Attending: Internal Medicine

## 2015-12-31 DIAGNOSIS — I255 Ischemic cardiomyopathy: Secondary | ICD-10-CM

## 2015-12-31 DIAGNOSIS — I5032 Chronic diastolic (congestive) heart failure: Secondary | ICD-10-CM

## 2015-12-31 DIAGNOSIS — Z992 Dependence on renal dialysis: Secondary | ICD-10-CM

## 2015-12-31 DIAGNOSIS — D638 Anemia in other chronic diseases classified elsewhere: Secondary | ICD-10-CM

## 2015-12-31 DIAGNOSIS — N186 End stage renal disease: Secondary | ICD-10-CM

## 2015-12-31 DIAGNOSIS — E1122 Type 2 diabetes mellitus with diabetic chronic kidney disease: Secondary | ICD-10-CM

## 2015-12-31 DIAGNOSIS — A480 Gas gangrene: Secondary | ICD-10-CM

## 2015-12-31 HISTORY — PX: AMPUTATION: SHX166

## 2015-12-31 LAB — CBC
HEMATOCRIT: 23.9 % — AB (ref 36.0–46.0)
HEMOGLOBIN: 7.9 g/dL — AB (ref 12.0–15.0)
MCH: 26.8 pg (ref 26.0–34.0)
MCHC: 33.1 g/dL (ref 30.0–36.0)
MCV: 81 fL (ref 78.0–100.0)
Platelets: 312 10*3/uL (ref 150–400)
RBC: 2.95 MIL/uL — ABNORMAL LOW (ref 3.87–5.11)
RDW: 16.5 % — AB (ref 11.5–15.5)
WBC: 17.6 10*3/uL — ABNORMAL HIGH (ref 4.0–10.5)

## 2015-12-31 LAB — GLUCOSE, CAPILLARY
GLUCOSE-CAPILLARY: 101 mg/dL — AB (ref 65–99)
GLUCOSE-CAPILLARY: 107 mg/dL — AB (ref 65–99)
GLUCOSE-CAPILLARY: 149 mg/dL — AB (ref 65–99)
GLUCOSE-CAPILLARY: 169 mg/dL — AB (ref 65–99)
Glucose-Capillary: 100 mg/dL — ABNORMAL HIGH (ref 65–99)
Glucose-Capillary: 128 mg/dL — ABNORMAL HIGH (ref 65–99)
Glucose-Capillary: 89 mg/dL (ref 65–99)
Glucose-Capillary: 95 mg/dL (ref 65–99)

## 2015-12-31 LAB — BASIC METABOLIC PANEL
ANION GAP: 11 (ref 5–15)
BUN: 36 mg/dL — AB (ref 6–20)
CO2: 25 mmol/L (ref 22–32)
Calcium: 8.4 mg/dL — ABNORMAL LOW (ref 8.9–10.3)
Chloride: 95 mmol/L — ABNORMAL LOW (ref 101–111)
Creatinine, Ser: 4.44 mg/dL — ABNORMAL HIGH (ref 0.44–1.00)
GFR calc Af Amer: 11 mL/min — ABNORMAL LOW (ref >60)
GFR calc non Af Amer: 9 mL/min — ABNORMAL LOW (ref >60)
GLUCOSE: 94 mg/dL (ref 65–99)
POTASSIUM: 4.5 mmol/L (ref 3.5–5.1)
Sodium: 131 mmol/L — ABNORMAL LOW (ref 135–145)

## 2015-12-31 LAB — TYPE AND SCREEN
ABO/RH(D): O POS
ANTIBODY SCREEN: NEGATIVE

## 2015-12-31 SURGERY — AMPUTATION, ABOVE KNEE
Anesthesia: General | Site: Leg Upper | Laterality: Right

## 2015-12-31 MED ORDER — KETAMINE HCL 10 MG/ML IJ SOLN
INTRAMUSCULAR | Status: DC | PRN
  Administered 2015-12-31: 40 mg via INTRAVENOUS

## 2015-12-31 MED ORDER — DEXTROSE 5 % IV SOLN
INTRAVENOUS | Status: DC | PRN
  Administered 2015-12-31: 10 ug/min via INTRAVENOUS

## 2015-12-31 MED ORDER — EPHEDRINE SULFATE 50 MG/ML IJ SOLN: INTRAMUSCULAR | Status: DC | PRN

## 2015-12-31 MED ORDER — KETAMINE HCL-SODIUM CHLORIDE 100-0.9 MG/10ML-% IV SOSY
PREFILLED_SYRINGE | INTRAVENOUS | Status: AC
  Filled 2015-12-31: qty 10

## 2015-12-31 MED ORDER — ONDANSETRON HCL 4 MG/2ML IJ SOLN
INTRAMUSCULAR | Status: DC | PRN
  Administered 2015-12-31: 4 mg via INTRAVENOUS

## 2015-12-31 MED ORDER — FENTANYL CITRATE (PF) 250 MCG/5ML IJ SOLN
INTRAMUSCULAR | Status: AC
  Filled 2015-12-31: qty 5

## 2015-12-31 MED ORDER — PROTAMINE SULFATE 10 MG/ML IV SOLN
INTRAVENOUS | Status: AC
  Filled 2015-12-31: qty 5

## 2015-12-31 MED ORDER — PHENYLEPHRINE HCL 10 MG/ML IJ SOLN
INTRAMUSCULAR | Status: DC | PRN
  Administered 2015-12-31: 80 ug via INTRAVENOUS
  Administered 2015-12-31: 120 ug via INTRAVENOUS

## 2015-12-31 MED ORDER — EPHEDRINE SULFATE 50 MG/ML IJ SOLN
INTRAMUSCULAR | Status: DC | PRN
  Administered 2015-12-31 (×2): 15 mg via INTRAVENOUS

## 2015-12-31 MED ORDER — SUGAMMADEX SODIUM 200 MG/2ML IV SOLN
INTRAVENOUS | Status: DC | PRN
  Administered 2015-12-31: 120 mg via INTRAVENOUS

## 2015-12-31 MED ORDER — ALBUMIN HUMAN 5 % IV SOLN
INTRAVENOUS | Status: DC | PRN
  Administered 2015-12-31: 15:00:00 via INTRAVENOUS

## 2015-12-31 MED ORDER — HYDROMORPHONE HCL 1 MG/ML IJ SOLN
1.0000 mg | INTRAMUSCULAR | Status: AC | PRN
  Administered 2015-12-31 – 2016-01-01 (×4): 1 mg via INTRAVENOUS
  Filled 2015-12-31 (×4): qty 1

## 2015-12-31 MED ORDER — PROPOFOL 10 MG/ML IV BOLUS
INTRAVENOUS | Status: AC
  Filled 2015-12-31: qty 20

## 2015-12-31 MED ORDER — ROCURONIUM BROMIDE 100 MG/10ML IV SOLN
INTRAVENOUS | Status: DC | PRN
  Administered 2015-12-31: 30 mg via INTRAVENOUS

## 2015-12-31 MED ORDER — SODIUM CHLORIDE 0.9 % IV SOLN
125.0000 mg | INTRAVENOUS | Status: DC
  Administered 2016-01-01: 125 mg via INTRAVENOUS
  Filled 2015-12-31 (×2): qty 10

## 2015-12-31 MED ORDER — 0.9 % SODIUM CHLORIDE (POUR BTL) OPTIME
TOPICAL | Status: DC | PRN
  Administered 2015-12-31: 1000 mL

## 2015-12-31 MED ORDER — SODIUM CHLORIDE 0.9 % IV SOLN
INTRAVENOUS | Status: DC
  Administered 2015-12-31: 35 mL/h via INTRAVENOUS

## 2015-12-31 MED ORDER — FENTANYL CITRATE (PF) 100 MCG/2ML IJ SOLN: 25.0000 ug | INTRAMUSCULAR | Status: DC | PRN

## 2015-12-31 MED ORDER — PROPOFOL 10 MG/ML IV BOLUS
INTRAVENOUS | Status: DC | PRN
  Administered 2015-12-31: 30 mg via INTRAVENOUS

## 2015-12-31 SURGICAL SUPPLY — 45 items
BANDAGE ACE 4X5 VEL STRL LF (GAUZE/BANDAGES/DRESSINGS) ×3 IMPLANT
BANDAGE ACE 6X5 VEL STRL LF (GAUZE/BANDAGES/DRESSINGS) ×3 IMPLANT
BANDAGE ESMARK 6X9 LF (GAUZE/BANDAGES/DRESSINGS) ×1 IMPLANT
BLADE SAW SAG 29X58X.64 (BLADE) ×3 IMPLANT
BNDG COHESIVE 6X5 TAN STRL LF (GAUZE/BANDAGES/DRESSINGS) ×3 IMPLANT
BNDG ESMARK 6X9 LF (GAUZE/BANDAGES/DRESSINGS) ×3
BNDG GAUZE ELAST 4 BULKY (GAUZE/BANDAGES/DRESSINGS) ×3 IMPLANT
CANISTER SUCTION 2500CC (MISCELLANEOUS) ×3 IMPLANT
CLIP TI MEDIUM 6 (CLIP) ×3 IMPLANT
COVER SURGICAL LIGHT HANDLE (MISCELLANEOUS) ×6 IMPLANT
COVER TABLE BACK 60X90 (DRAPES) ×3 IMPLANT
CUFF TOURNIQUET SINGLE 24IN (TOURNIQUET CUFF) ×3 IMPLANT
DRAIN CHANNEL 19F RND (DRAIN) IMPLANT
DRAPE ORTHO SPLIT 77X108 STRL (DRAPES) ×4
DRAPE PROXIMA HALF (DRAPES) ×3 IMPLANT
DRAPE SURG ORHT 6 SPLT 77X108 (DRAPES) ×2 IMPLANT
DRAPE U-SHAPE 47X51 STRL (DRAPES) ×3 IMPLANT
DRSG ADAPTIC 3X8 NADH LF (GAUZE/BANDAGES/DRESSINGS) ×3 IMPLANT
ELECT REM PT RETURN 9FT ADLT (ELECTROSURGICAL) ×3
ELECTRODE REM PT RTRN 9FT ADLT (ELECTROSURGICAL) ×1 IMPLANT
EVACUATOR SILICONE 100CC (DRAIN) IMPLANT
GAUZE SPONGE 4X4 12PLY STRL (GAUZE/BANDAGES/DRESSINGS) ×6 IMPLANT
GLOVE BIO SURGEON STRL SZ7 (GLOVE) ×3 IMPLANT
GLOVE BIOGEL PI IND STRL 7.5 (GLOVE) ×1 IMPLANT
GLOVE BIOGEL PI INDICATOR 7.5 (GLOVE) ×2
GOWN STRL REUS W/ TWL LRG LVL3 (GOWN DISPOSABLE) ×3 IMPLANT
GOWN STRL REUS W/TWL LRG LVL3 (GOWN DISPOSABLE) ×6
KIT BASIN OR (CUSTOM PROCEDURE TRAY) ×3 IMPLANT
KIT ROOM TURNOVER OR (KITS) ×3 IMPLANT
NS IRRIG 1000ML POUR BTL (IV SOLUTION) ×3 IMPLANT
PACK GENERAL/GYN (CUSTOM PROCEDURE TRAY) ×3 IMPLANT
PAD ARMBOARD 7.5X6 YLW CONV (MISCELLANEOUS) ×6 IMPLANT
SPONGE GAUZE 4X4 12PLY STER LF (GAUZE/BANDAGES/DRESSINGS) ×3 IMPLANT
STAPLER VISISTAT 35W (STAPLE) ×3 IMPLANT
STOCKINETTE IMPERVIOUS LG (DRAPES) ×3 IMPLANT
SUT ETHILON 3 0 PS 1 (SUTURE) IMPLANT
SUT SILK 0 TIES 10X30 (SUTURE) ×3 IMPLANT
SUT SILK 2 0 (SUTURE) ×2
SUT SILK 2-0 18XBRD TIE 12 (SUTURE) ×1 IMPLANT
SUT VIC AB 2-0 CT1 18 (SUTURE) ×6 IMPLANT
SUT VIC AB 3-0 SH 18 (SUTURE) IMPLANT
TOWEL OR 17X24 6PK STRL BLUE (TOWEL DISPOSABLE) ×3 IMPLANT
TOWEL OR 17X26 10 PK STRL BLUE (TOWEL DISPOSABLE) ×3 IMPLANT
UNDERPAD 30X30 (UNDERPADS AND DIAPERS) ×3 IMPLANT
WATER STERILE IRR 1000ML POUR (IV SOLUTION) ×3 IMPLANT

## 2015-12-31 NOTE — Transfer of Care (Signed)
Immediate Anesthesia Transfer of Care Note  Patient: Tanesa Gebel  Procedure(s) Performed: Procedure(s): AMPUTATION ABOVE KNEE RIGHT (Right)  Patient Location: PACU  Anesthesia Type:General  Level of Consciousness: awake and alert   Airway & Oxygen Therapy: Patient Spontanous Breathing and Patient connected to face mask oxygen  Post-op Assessment: Report given to RN and Post -op Vital signs reviewed and stable  Post vital signs: Reviewed and stable  Last Vitals:  Vitals:   12/31/15 1224 12/31/15 1525  BP: (!) 92/43 118/69  Pulse: (!) 118   Resp: 16   Temp: 36.8 C 36.4 C    Last Pain:  Vitals:   12/31/15 0812  TempSrc:   PainSc: 5          Complications: No apparent anesthesia complications

## 2015-12-31 NOTE — Progress Notes (Signed)
Subjective: Interval History: none.. Comfortable this morning  Objective: Vital signs in last 24 hours: Temp:  [97.6 F (36.4 C)-98.6 F (37 C)] 97.6 F (36.4 C) (11/09 0537) Pulse Rate:  [93-103] 93 (11/09 0537) Resp:  [14-18] 18 (11/09 0537) BP: (88-104)/(43-61) 88/43 (11/09 0537) SpO2:  [97 %-100 %] 100 % (11/09 0537) Weight:  [80 lb (36.3 kg)] 80 lb (36.3 kg) (11/08 1948)  Intake/Output from previous day: No intake/output data recorded. Intake/Output this shift: No intake/output data recorded.  No change  Lab Results:  Recent Labs  12/30/15 2036 12/31/15 0238  WBC 20.0* 17.6*  HGB 8.4* 7.9*  HCT 25.7* 23.9*  PLT 329 312   BMET  Recent Labs  12/30/15 1616 12/30/15 2036 12/31/15 0238  NA 132*  --  131*  K 4.6  --  4.5  CL 96*  --  95*  CO2 24  --  25  GLUCOSE 161*  --  94  BUN 39*  --  36*  CREATININE 4.16* 4.28* 4.44*  CALCIUM 8.5*  --  8.4*    Studies/Results: Dg Chest Port 1 View  Result Date: 12/08/2015 CLINICAL DATA:  Sepsis and hypothermia EXAM: PORTABLE CHEST 1 VIEW COMPARISON:  Chest radiograph 03/09/2015 FINDINGS: There is calcification within the aortic arch. Small bilateral pleural effusions. The right-sided effusion has increased in size. There diffusely increased pulmonary markings throughout both lungs. No focal area of consolidation. No pneumothorax. IMPRESSION: Slightly increased size of right pleural effusion. Unchanged left pleural effusion. No focal airspace consolidation. Electronically Signed   By: Deatra Robinson M.D.   On: 12/08/2015 18:04   Dg Foot Complete Right  Result Date: 12/30/2015 CLINICAL DATA:  Necrotic toes of the right foot. EXAM: RIGHT FOOT COMPLETE - 3+ VIEW COMPARISON:  None. FINDINGS: There is extensive arterial vascular calcification in the foot. There is gas in the soft tissues throughout the plantar aspect of the foot, most prominent in the forefoot but extending under the plantar aspect of the calcaneus. No discrete  bone destruction or fractures. 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 Ankle joint appears normal. 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 No radiographic evidence of osteomyelitis. Extensive arterial vascular calcifications in the foot and ankle. Electronically Signed   By: Francene Boyers M.D.   On: 12/30/2015 16:37   Anti-infectives: Anti-infectives    Start     Dose/Rate Route Frequency Ordered Stop   12/31/15 0600  piperacillin-tazobactam (ZOSYN) IVPB 3.375 g     3.375 g 12.5 mL/hr over 240 Minutes Intravenous Every 12 hours 12/30/15 2006     12/30/15 1730  piperacillin-tazobactam (ZOSYN) IVPB 3.375 g     3.375 g 12.5 mL/hr over 240 Minutes Intravenous  Once 12/30/15 1722 12/30/15 2239   12/30/15 1730  vancomycin (VANCOCIN) IVPB 1000 mg/200 mL premix     1,000 mg 200 mL/hr over 60 Minutes Intravenous  Once 12/30/15 1722 12/30/15 1958      Assessment/Plan: s/p Procedure(s): AMPUTATION ABOVE KNEE (Right) Discussed with the patient and her granddaughter present this morning. Plan for above-knee amputation on the right with Dr. Imogene Burn later today   LOS: 1 day   Elizabeth [name] 12/31/2015, 7:04 AM

## 2015-12-31 NOTE — Op Note (Signed)
    OPERATIVE NOTE   PROCEDURE: right above-the-knee amputation  PRE-OPERATIVE DIAGNOSIS: right foot gangrene  POST-OPERATIVE DIAGNOSIS: same as above  SURGEON: Leonides Sake, MD  ASSISTANT(S): Lianne Cure, PAC   ANESTHESIA: general  ESTIMATED BLOOD LOSS: 100 cc  FINDING(S): 1.  Viable thigh muscle 2.  Heavily calcified superficial femoral artery   SPECIMEN(S):  right above-the-knee amputation  INDICATIONS:   Elizabeth [name] is a 70 y.o. female who presents with right foot gangrene.  The patient is scheduled for a right above-the-knee amputation.  I discussed in depth with the patient the risks, benefits, and alternatives to this procedure.  The patient is aware that the risk of this operation included but are not limited to:  bleeding, infection, myocardial infarction, stroke, death, failure to heal amputation wound, and possible need for more proximal amputation.  The patient is aware of the risks and agrees proceed forward with the procedure.  DESCRIPTION: After full informed written consent was obtained from the patient, the patient was brought back to the operating room, and placed supine upon the operating table.  Prior to induction, the patient received IV antibiotics.  The patient was then prepped and draped in the standard fashion for an above-the-knee amputation.  I placed a non-sterile tourniquet on the thigh prior to the procedure.  After obtaining adequate anesthesia, the patient was prepped and draped in the standard fashion for a above-the-knee amputation.  I marked out the anterior and posterior flaps for a fish-mouth type of amputation.  I exsanguinated the leg with an Esmarch bandage and then inflated the tourniquet to 250 mm Hg.   I made the incisions for these flaps, and then dissected through the subcutaneous tissue, fascia, and muscles circumferentially.  I elevated  the periosteal tissue 4 cm more proximal than the anterior skin flap.  I then transected the femur  with a power saw at this level.  Then I smoothed out the rough edges of the bone with a rasp.  At this point, the specimen was passed off the field as the above-the-knee amputation.  At this point, I clamped all visibly bleeding arteries and veins using a combination of suture ligation with Vicryl suture and electrocautery.  The tourniquet was then deflated at this point.  Bleeding continued to be controlled with electrocautery and suture ligature.  The stump was washed off with sterile normal saline and no further active bleeding was noted.  I reapproximated the anterior and posterior fascia  with interrupted stitches of 2-0 Vicryl.  This was completed along the entire length of anterior and posterior fascia until there were no more loose space in the fascial line.  The skin was then  reapproximated with staples.  The stump was washed off and dried.  The incision was dressed with [employer] and  then fluffs were applied.  Kerlix was wrapped around the leg and then gently an ACE wrap was applied.     COMPLICATIONS: none  CONDITION: stable   Leonides Sake, MD, [facility] [place] Office: [phone] Pager: [phone]  12/31/2015, 3:09 PM

## 2015-12-31 NOTE — Clinical Social Work Placement (Signed)
   CLINICAL SOCIAL WORK PLACEMENT  NOTE  Date:  12/31/2015  Patient Details  Name: Elizabeth [name] MRN: [id number] Date of Birth: 11/26/1945  Clinical Social Work is seeking post-discharge placement for this patient at the Skilled  Nursing Facility level of care (*CSW will initial, date and re-position this form in  chart as items are completed):      Patient/family provided with [facility] Clinical Social Work Department's list of facilities offering this level of care within the geographic area requested by the patient (or if unable, by the patient's family).  Yes   Patient/family informed of their freedom to choose among providers that offer the needed level of care, that participate in Medicare, Medicaid or managed care program needed by the patient, have an available bed and are willing to accept the patient.      Patient/family informed of [facility]'s ownership interest in [facility] and [facility], as well as of the fact that they are under no obligation to receive care at these facilities.  PASRR submitted to EDS on       PASRR number received on       Existing PASRR number confirmed on 12/31/15     FL2 transmitted to all facilities in geographic area requested by pt/family on       FL2 transmitted to all facilities within larger geographic area on       Patient informed that his/her managed care company has contracts with or will negotiate with certain facilities, including the following:            Patient/family informed of bed offers received.  Patient chooses bed at [facility]     Physician recommends and patient chooses bed at      Patient to be transferred to [facility] on  .  Patient to be transferred to facility by [facility]     Patient family notified on   of transfer.  Name of family member notified:        PHYSICIAN Please prepare priority discharge summary, including medications,  Please prepare prescriptions, Please sign FL2, Please sign DNR     Additional Comment:    _______________________________________________ Volney American, LCSW 12/31/2015, 2:58 PM

## 2015-12-31 NOTE — Progress Notes (Signed)
[facility]                                                                              Patient Demographics  Elizabeth [name], is a 70 y.o. female, DOB - Dec 30, 1945, AVW:[id number]  Admit date - 12/30/2015   Admitting Physician Starleen Arms, MD  Outpatient Primary MD for the patient is Kirt Boys, DO  Outpatient specialists:   LOS - 1  days    No chief complaint on file.      Brief summary   Elizabeth [name]  is a 70 y.o. female, with medical history significant of ESRD on Tu/Th/Sat HD, HLD, h/o CVA, chronic indwelling foley, DM, PVD s/p amputation , patient was sent from SNF facility for increase in discoloration and necrosis of her toes on the right foot, progressive symptoms over one week, patient denies any fever or chills, but reports it was weakness and poor appetite, patient with history of gangrene and left lower extremity status post amputation by Dr. early in March 2016. - in ED workup significant for leukocytosis of 19.9, anemia with hemoglobin of 8.8, creatinine of 4.16, lactic acid within normal limits at 1.3, foot x-ray significant of gas gangrene and right foot, patient started on IV vancomycin and Zosyn.   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  Code Status: DNR  DVT Prophylaxis:   heparin  Family Communication: Discussed in detail with the patient, all imaging results, lab results explained to the patient  Disposition Plan:  Time Spent in minutes  25 minutes  Procedures:    Consultants:   Nephrology Vascular surgery  Antimicrobials:  Vancomycin Zosyn  Medications  Scheduled Meds: . aspirin EC  81 mg Oral Daily  . feeding supplement (ENSURE ENLIVE)  237 mL Oral BID BM  . fluticasone  1 spray Each Nare BID  . heparin  5,000 Units Subcutaneous Q8H  . insulin aspart  0-9 Units Subcutaneous Q4H  . lidocaine-prilocaine  1 application Topical Q T,Th,Sa-HD  . loratadine  10 mg Oral Daily  . pantoprazole  40 mg Oral Daily  . piperacillin-tazobactam (ZOSYN)  IV  3.375 g  Intravenous Q12H  . pravastatin  20 mg Oral QPM  . senna  1 tablet Oral BID  . sevelamer carbonate  1,600 mg Oral TID WC  . sodium chloride flush  3 mL Intravenous Q12H  . ascorbic acid  500 mg Oral BID   Continuous Infusions: PRN Meds:.acetaminophen **OR** acetaminophen, oxyCODONE-acetaminophen, promethazine   Antibiotics   Anti-infectives    Start     Dose/Rate Route Frequency Ordered Stop   12/31/15 0600  piperacillin-tazobactam (ZOSYN) IVPB 3.375 g     3.375 g 12.5 mL/hr over 240 Minutes Intravenous Every 12 hours 12/30/15 2006     12/30/15 1730  piperacillin-tazobactam (ZOSYN) IVPB 3.375 g     3.375 g 12.5 mL/hr over 240 Minutes Intravenous  Once 12/30/15 1722 12/30/15 2239   12/30/15 1730  vancomycin (VANCOCIN) IVPB 1000 mg/200 mL premix     1,000 mg 200 mL/hr over 60 Minutes Intravenous  Once 12/30/15 1722 12/30/15 1958         Subjective:   Elizabeth [name] was seen and examined today.  Patient denies dizziness, chest pain, shortness of breath, abdominal pain, N/V/D/C, new weakness, numbess, tingling. No acute events overnight.    Objective:   Vitals:   12/30/15 1449 12/30/15 1815 12/30/15 1948 12/31/15 0537  BP:  (!) 101/54 (!) 93/47 (!) 88/43  Pulse:  103 (!) 101 93  Resp:  14 15 18   Temp:   98.2 F (36.8 C) 97.6 F (36.4 C)  TempSrc:   Oral Oral  SpO2:  97% 100% 100%  Weight: 36.3 kg (80 lb)  36.3 kg (80 lb)   Height: 5\' 3"  (1.6 m)  5\' 3"  (1.6 m)     Intake/Output Summary (Last 24 hours) at 12/31/15 1132 Last data filed at 12/31/15 0754  Gross per 24 hour  Intake              200 ml  Output                0 ml  Net              200 ml     Wt Readings from Last 3 Encounters:  12/30/15 36.3 kg (80 lb)  12/29/15 36.7 kg (80 lb 12.8 oz)  12/17/15 35.1 kg (77 lb 6 oz)     Exam  General: Alert and oriented x 3, NAD  HEENT:  PERRLA, EOMI, Anicteric Sclera, mucous membranes moist.   Neck: Supple, no JVD, no masses  Cardiovascular: S1 S2 auscultated, no rubs, murmurs or gallops. Regular rate and rhythm.  Respiratory: Clear to auscultation bilaterally, no wheezing, rales or rhonchi  Gastrointestinal: Soft, nontender, nondistended, + bowel sounds  Ext: no cyanosis clubbing. Left below-knee and petition. Extensive gangrene on the entire right forefoot.  Neuro: AAOx3, Cr N's II- XII. Strength 5/5 upper and lower extremities bilaterally  Skin: Gangrene on the entire forefoot of the right leg  Psych: Normal affect and demeanor, alert and oriented x3    Data Reviewed:  I have personally reviewed following labs and imaging studies  Micro Results No results found for this or any previous visit (from the past 240 hour(s)).  Radiology Reports Dg Chest Port 1 View  Result Date: 12/08/2015 CLINICAL DATA:  Sepsis and hypothermia EXAM: PORTABLE CHEST 1 VIEW COMPARISON:  Chest  radiograph 03/09/2015 FINDINGS: There is calcification within the aortic arch. Small bilateral pleural effusions. The right-sided effusion has increased in size. There diffusely increased pulmonary markings throughout both lungs. No focal  area of consolidation. No pneumothorax. IMPRESSION: Slightly increased size of right pleural effusion. Unchanged left pleural effusion. No focal airspace consolidation. Electronically Signed   By: Deatra Robinson M.D.   On: 12/08/2015 18:04   Dg Foot Complete Right  Result Date: 12/30/2015 CLINICAL DATA:  Necrotic toes of the right foot. EXAM: RIGHT FOOT COMPLETE - 3+ VIEW COMPARISON:  None. FINDINGS: There is extensive arterial vascular calcification in the foot. There is gas in the soft tissues throughout the plantar aspect of the foot, most prominent in the forefoot but extending under the plantar aspect of the calcaneus. No discrete bone destruction or fractures. 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 Ankle joint appears normal. 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 No radiographic evidence of osteomyelitis. Extensive arterial vascular calcifications in the foot and ankle. Electronically Signed   By: Francene Boyers M.D.   On: 12/30/2015 16:37    Lab Data:  CBC:  Recent Labs Lab 12/30/15 1616 12/30/15 2036 12/31/15 0238  WBC 19.9* 20.0* 17.6*  NEUTROABS 18.2*  --   --   HGB 8.8* 8.4* 7.9*  HCT 26.9* 25.7* 23.9*  MCV 82.5 81.6 81.0  PLT 316 329 312   Basic Metabolic Panel:  Recent Labs Lab 12/30/15 1616 12/30/15 2036 12/31/15 0238  NA 132*  --  131*  K 4.6  --  4.5  CL 96*  --  95*  CO2 24  --  25  GLUCOSE 161*  --  94  BUN 39*  --  36*  CREATININE 4.16* 4.28* 4.44*  CALCIUM 8.5*  --  8.4*   GFR: Estimated Creatinine Clearance: 6.7 mL/min (by C-G formula based on SCr of 4.44 mg/dL (H)). Liver Function Tests: No results for input(s): AST, ALT, ALKPHOS, BILITOT, PROT, ALBUMIN in the last 168 hours. No  results for input(s): LIPASE, AMYLASE in the last 168 hours. No results for input(s): AMMONIA in the last 168 hours. Coagulation Profile: No results for input(s): INR, PROTIME in the last 168 hours. Cardiac Enzymes: No results for input(s): CKTOTAL, CKMB, CKMBINDEX, TROPONINI in the last 168 hours. BNP (last 3 results) No results for input(s): PROBNP in the last 8760 hours. HbA1C: No results for input(s): HGBA1C in the last 72 hours. CBG:  Recent Labs Lab 12/30/15 2013 12/31/15 0011 12/31/15 0422 12/31/15 0800 12/31/15 1116  GLUCAP 229* 128* 100* 95 101*   Lipid Profile: No results for input(s): CHOL, HDL, LDLCALC, TRIG, CHOLHDL, LDLDIRECT in the last 72 hours. Thyroid Function Tests: No results for input(s): TSH, T4TOTAL, FREET4, T3FREE, THYROIDAB in the last 72 hours. Anemia Panel: No results for input(s): VITAMINB12, FOLATE, FERRITIN, TIBC, IRON, RETICCTPCT in the last 72 hours. Urine analysis:    Component Value Date/Time   COLORURINE RED (A) 12/08/2015 1746   APPEARANCEUR TURBID (A) 12/08/2015 1746   LABSPEC 1.013 12/08/2015 1746   PHURINE 8.5 (H) 12/08/2015 1746   GLUCOSEU NEGATIVE 12/08/2015 1746   HGBUR LARGE (A) 12/08/2015 1746   BILIRUBINUR SMALL (A) 12/08/2015 1746   KETONESUR NEGATIVE 12/08/2015 1746   PROTEINUR >300 (A) 12/08/2015 1746   UROBILINOGEN 0.2 06/25/2014 1436   NITRITE NEGATIVE 12/08/2015 1746   LEUKOCYTESUR LARGE (A) 12/08/2015 1746     Elizabeth [name] M.D. [facility] 12/31/2015, 11:32 AM  Pager: [phone] Between 7am to 7pm - call Pager - [phone]  After 7pm go to [email or web address] - password [email or web address]  Call night coverage person covering after 7pm

## 2015-12-31 NOTE — Anesthesia Procedure Notes (Signed)
Procedure Name: Intubation Date/Time: 12/31/2015 2:05 PM Performed by: Fransisca Kaufmann Pre-anesthesia Checklist: Patient identified, Emergency Drugs available, Suction available and Patient being monitored Patient Re-evaluated:Patient Re-evaluated prior to inductionOxygen Delivery Method: Circle System Utilized Preoxygenation: Pre-oxygenation with 100% oxygen Intubation Type: IV induction Ventilation: Mask ventilation without difficulty Tube type: Oral Tube size: 7.0 mm Number of attempts: 1 Airway Equipment and Method: Stylet Placement Confirmation: ETT inserted through vocal cords under direct vision,  positive ETCO2 and breath sounds checked- equal and bilateral Secured at: 22 cm Tube secured with: Tape Dental Injury: Teeth and Oropharynx as per pre-operative assessment

## 2015-12-31 NOTE — Interval H&P Note (Signed)
[facility] [place]  History and Physical Update  The patient was interviewed and re-examined.  The patient's previous History and Physical has been reviewed and is unchanged from Dr. Arbie Cookey consult.  There is no change in the plan of care: R AKA.   I discussed in depth the nature of above-the-knee amputation with the patient, including risks, benefits, and alternatives.    The patient is aware that the risks of above-the-knee amputation include but are not limited to: bleeding, infection, myocardial infarction, stroke, death, failure to heal amputation wound, and possible need for more proximal amputation.    The patient is aware of the risks and agrees proceed forward with the procedure.   Leonides Sake, MD [facility] [place] Office: [phone] Pager: [phone]  12/31/2015, 1:24 PM

## 2015-12-31 NOTE — H&P (View-Only) (Signed)
[facility]  Patient name: Elizabeth [name] MRN: [id number] DOB: June 18, 1945 Sex: female  REASON FOR CONSULT: Gas gangrene right foot  HPI: Elizabeth [name] is a 70 y.o. female, who is well known to our service. She had extensive prevascular" attempts by Dr. Darrick Penna in January 2016 on her left leg. She eventually had the failure of this and underwent left below-knee amputation by myself in March 2016. She healed this without difficulty. She has had multiple recent hospitalizations with sepsis and other issues of failing health. She has long-standing end-stage renal disease and severe insulin-dependent diabetes. In a recent admission for sepsis 2 weeks ago there was some discussion regarding withdrawing hemodialysis with palliative care only. This discussion was ongoing. The patient presented to the [facility] long emergency room this evening after she was noted to have tenderness changes in her right foot at her nursing facility. Evaluation there revealed gas gangrene and she is transferred to [facility] for ongoing dialysis and surgical treatment. She is awake and alert and understands the discussion. She does not want to stop dialysis and does want to consider amputation as a lifesaving method  Past Medical History:  Diagnosis Date  . Acute hyperkalemia 12/24/2013  . Anemia   . Arthritis of knee, right    "right knee" (06/12/2014)  . Coronary artery disease, non-occlusive January 2016   Moderate p&mLAD & Ostial OM; Myoview Nuclear Stress Test 03/21/2014: EF 35%. Mild hypokinesis of the inferior wall with fixed inferior defect suggesting scar. (no culprit lesion on CATH)  . Critical lower limb ischemia    Status post left BKA following left popliteal and peroneal artery endarterectomy with patch angioplasty.  . Diabetes mellitus, insulin dependent (IDDM), uncontrolled (HCC)    Brittle; has h/o Hypo & Hyperglycemic episodes, Type  II  . Diabetic neuropathy (HCC)   . ESRD (end stage renal disease) (HCC)    [address], [place], [place] (06/12/2014)  . Foley catheter in place    for urinary retention  . Foot infection   . Gout    "was in my LLE"  . Hematemesis April 2016   Hospitalized  . History of CVA (cerebrovascular accident) 08/19/2013   "minor one"  . Hyperlipidemia with target LDL less than 70   . Loss of consciousness (HCC) 08/19/2013  . 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 Urinary retention    DX NEUROGENIC BLADDER--HAS INDWELLING FOLEY CATHETER  . UTI (urinary tract infection) 10/17/2013    Family History  Problem Relation Age of Onset  . Hyperlipidemia Mother   . Hypertension Mother   . Hodgkin's lymphoma Father   . Heart disease Sister     SOCIAL HISTORY: Social History   Social History  . Marital status: Single    Spouse name: N/A  . Number of children: N/A  . Years of education: N/A   Occupational History  . disabled    Social History Main Topics  . Smoking status: Never Smoker  . Smokeless tobacco: Never Used  . Alcohol use No     Comment: "stopped drinking in 2013 drank a couple beers/day, 2 days/wk"  . Drug use: No  . Sexual activity: No   Other Topics Concern  . Not on file   Social History Narrative   Granddaughter is contact  Resident of [place]- SNF     No Known Allergies  Current Facility-Administered Medications  Medication Dose Route Frequency Provider Last Rate Last Dose  . piperacillin-tazobactam (ZOSYN) IVPB 3.375 g  3.375 g Intravenous Once Kevin Campos, MD 12.5 mL/hr at 12/30/15 1839 3.375 g at 12/30/15 1839  . vancomycin (VANCOCIN) IVPB 1000 mg/200 mL premix  1,000 mg Intravenous Once Kevin Campos, MD 200 mL/hr at 12/30/15 1858 1,000 mg at 12/30/15 1858    REVIEW OF  SYSTEMS:  Reviewed in her record with nothing to add other than her past medical and history   PHYSICAL EXAM: Vitals:   12/30/15 1442 12/30/15 1449 12/30/15 1815  BP: 104/61  (!) 101/54  Pulse: 98  103  Resp: 18  14  Temp: 98.6 F (37 C)    TempSrc: Oral    SpO2: 100%  97%  Weight:  80 lb (36.3 kg)   Height:  5' 3" (1.6 m)     GENERAL: The patient is a well-nourished female, in no acute distress. The vital signs are documented above. CARDIOVASCULAR: Palpable right femoral pulse absent right popliteal and distal pulses PULMONARY: There is good air exchange  ABDOMEN: Soft and non-tender  MUSCULOSKELETAL: Well-healed left below-knee amputation NEUROLOGIC: No focal weakness or paresthesias are detected. SKIN: Extensive gangrenous changes over her entire forefoot. Mild tenderness in her calf PSYCHIATRIC: The patient has a normal affect.  DATA:  Plain films of her foot revealed gas in her foot  MEDICAL ISSUES: I discussed the options with patient. Explained that her only option would be amputation. Feel that she would be very high risk for not healing of below-knee amputation and this would provide her no additional benefit from a rehabilitation standpoint. Have recommended right below-knee amputation. She understands and wished to proceed tomorrow. Dr. Chen is available. She knows him from prior evaluation and treatment of her AV access.   Ronnette Rump F. Wofford Stratton, MD FACS [facility] [place] Office Tel [phone] Pager [phone]     

## 2015-12-31 NOTE — Anesthesia Preprocedure Evaluation (Addendum)
Anesthesia Evaluation  Patient identified by MRN, date of birth, ID band Patient awake    Reviewed: Allergy & Precautions, H&P , NPO status , Patient's Chart, lab work & pertinent test results  Airway Mallampati: II  TM Distance: >3 FB Neck ROM: Full    Dental no notable dental hx. (+) Poor Dentition   Pulmonary neg pulmonary ROS,    Pulmonary exam normal breath sounds clear to auscultation       Cardiovascular + CAD, + Peripheral Vascular Disease and +CHF   Rhythm:Regular Rate:Tachycardia     Neuro/Psych negative neurological ROS  negative psych ROS   GI/Hepatic negative GI ROS, Neg liver ROS,   Endo/Other  diabetes, Insulin Dependent  Renal/GU ESRF and DialysisRenal disease  negative genitourinary   Musculoskeletal  (+) Arthritis , Osteoarthritis,    Abdominal   Peds  Hematology negative hematology ROS (+) anemia ,   Anesthesia Other Findings   Reproductive/Obstetrics negative OB ROS                            Anesthesia Physical Anesthesia Plan  ASA: IV  Anesthesia Plan: General   Post-op Pain Management:    Induction: Intravenous  Airway Management Planned: Oral ETT  Additional Equipment:   Intra-op Plan:   Post-operative Plan: Extubation in OR  Informed Consent: I have reviewed the patients History and Physical, chart, labs and discussed the procedure including the risks, benefits and alternatives for the proposed anesthesia with the patient or authorized representative who has indicated his/her understanding and acceptance.   Dental advisory given  Plan Discussed with: CRNA  Anesthesia Plan Comments:         Anesthesia Quick Evaluation

## 2015-12-31 NOTE — [place] FL2 (Signed)
[place] MEDICAID FL2 LEVEL OF CARE SCREENING TOOL     IDENTIFICATION  Patient Name: Elizabeth [name] Birthdate: 02-15-1946 Sex: female Admission Date (Current Location): 12/30/2015  [facility] and [place] Number:  Producer, television/film/video and Address:  [facility] [place], [address], [place], [place] [address]      Provider Number: [id number]  Attending Physician Name and Address:  Cathren Harsh, MD  Relative Name and Phone Number:       Current Level of Care: Hospital Recommended Level of Care: Skilled Nursing Facility Prior Approval Number:    Date Approved/Denied:   PASRR Number: [id number]  Discharge Plan: SNF    Current Diagnoses: Patient Active Problem List   Diagnosis Date Noted  . Gas gangrene (HCC) 12/30/2015  . Palliative care encounter   . Sepsis (HCC) 12/08/2015  . Chronic diastolic congestive heart failure (HCC) 08/06/2015  . Dyslipidemia associated with type 2 diabetes mellitus (HCC) 03/16/2015  . 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 Venous stenosis of left upper extremity 02/06/2015  . 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 DM (diabetes mellitus) type II controlled with renal manifestation (HCC) 06/30/2014  . S/P BKA (below knee amputation) unilateral (HCC) 05/28/2014  . Peripheral artery disease (HCC) 04/16/2014  . Moderate 3V CAD at cath 03/24/14   . Cardiomyopathy, ischemic   . Neuropathy (HCC) 03/17/2014  . Protein-calorie malnutrition, severe (HCC) 12/27/2013  . ESRD on hemodialysis (HCC) 10/17/2013  . Hypoglycemia 08/19/2013  . Hypothermia 04/24/2012  . Anemia of chronic disease 04/24/2012    Orientation RESPIRATION BLADDER Height & Weight     Time, Situation, Place, Self  Normal Continent Weight: 80 lb (36.3 kg) Height:  5\' 3"  (160 cm)  BEHAVIORAL SYMPTOMS/MOOD NEUROLOGICAL BOWEL NUTRITION STATUS      Continent Diet (NPO, sips with medication)  AMBULATORY STATUS COMMUNICATION  OF NEEDS Skin   Extensive Assist Verbally PU Stage and Appropriate Care       PU Stage 4 Dressing:  (Three times a week or PRN)               Personal Care Assistance Level of Assistance  Bathing, Feeding, Dressing Bathing Assistance: Maximum assistance Feeding assistance: Limited assistance Dressing Assistance: Maximum assistance     Functional Limitations Info  Sight, Hearing, Speech Sight Info: Adequate Hearing Info: Adequate Speech Info: Adequate    SPECIAL CARE FACTORS FREQUENCY  PT (By licensed PT), OT (By licensed OT)     PT Frequency: 3xweek OT Frequency: 3xweek            Contractures Contractures Info: Not present    Additional Factors Info  Code Status, Allergies Code Status Info: DNR Allergies Info: No known allergies   Insulin Sliding Scale Info:  (insulin aspart (novoLOG) injection 0-9 Units, every 4 hours. DO NOT hold insulin if pt is NPO)       Current Medications (12/31/2015):  This is the current hospital active medication list Current Facility-Administered Medications  Medication Dose Route Frequency Provider Last Rate Last Dose  . 0.9 %  sodium chloride infusion   Intravenous Continuous Gaynelle Adu, MD 35 mL/hr at 12/31/15 1315 35 mL/hr at 12/31/15 1315  . 0.9 % irrigation (POUR BTL)    PRN Fransisco Hertz, MD   1,000 mL at 12/31/15 1433  . [MAR Hold] acetaminophen (TYLENOL) tablet 650 mg  650 mg Oral Q6H PRN Starleen Arms, MD  Or  . [MAR Hold] acetaminophen (TYLENOL) suppository 650 mg  650 mg Rectal Q6H PRN Starleen Armsawood S Elgergawy, MD      . Mitzi Hansen[MAR Hold] aspirin EC tablet 81 mg  81 mg Oral Daily Starleen Armsawood S Elgergawy, MD   81 mg at 12/30/15 2205  . [MAR Hold] feeding supplement (ENSURE ENLIVE) (ENSURE ENLIVE) liquid 237 mL  237 mL Oral BID BM Starleen Armsawood S Elgergawy, MD      . Mitzi Hansen[MAR Hold] ferric gluconate (NULECIT) 125 mg in sodium chloride 0.9 % 100 mL IVPB  125 mg Intravenous Q M,W,F-HD [name], PA-C      . [MAR Hold] fluticasone  (FLONASE) 50 MCG/ACT nasal spray 1 spray  1 spray Each Nare BID Starleen Armsawood S Elgergawy, MD      . Mitzi Hansen[MAR Hold] heparin injection 5,000 Units  5,000 Units Subcutaneous Q8H Starleen Armsawood S Elgergawy, MD      . Mitzi Hansen[MAR Hold] insulin aspart (novoLOG) injection 0-9 Units  0-9 Units Subcutaneous Q4H Starleen Armsawood S Elgergawy, MD   3 Units at 12/30/15 2048  . [MAR Hold] lidocaine-prilocaine (EMLA) cream 1 application  1 application Topical Q T,Th,Sa-HD Starleen Armsawood S Elgergawy, MD      . Mitzi Hansen[MAR Hold] loratadine (CLARITIN) tablet 10 mg  10 mg Oral Daily Starleen Armsawood S Elgergawy, MD      . Mitzi Hansen[MAR Hold] oxyCODONE-acetaminophen (PERCOCET/ROXICET) 5-325 MG per tablet 1 tablet  1 tablet Oral Q8H PRN Starleen Armsawood S Elgergawy, MD      . Mitzi Hansen[MAR Hold] pantoprazole (PROTONIX) EC tablet 40 mg  40 mg Oral Daily Starleen Armsawood S Elgergawy, MD   40 mg at 12/30/15 2205  . [MAR Hold] piperacillin-tazobactam (ZOSYN) IVPB 3.375 g  3.375 g Intravenous Q12H Deirdre EvenerDrew A Wofford, RPH   3.375 g at 12/31/15 0535  . [MAR Hold] pravastatin (PRAVACHOL) tablet 20 mg  20 mg Oral QPM Starleen Armsawood S Elgergawy, MD      . Mitzi Hansen[MAR Hold] promethazine (PHENERGAN) tablet 25 mg  25 mg Oral Q6H PRN Starleen Armsawood S Elgergawy, MD      . Mitzi Hansen[MAR Hold] senna (SENOKOT) tablet 8.6 mg  1 tablet Oral BID Starleen Armsawood S Elgergawy, MD      . Mitzi Hansen[MAR Hold] sevelamer carbonate (RENVELA) tablet 1,600 mg  1,600 mg Oral TID WC Starleen Armsawood S Elgergawy, MD      . Mitzi Hansen[MAR Hold] sodium chloride flush (NS) 0.9 % injection 3 mL  3 mL Intravenous Q12H Starleen Armsawood S Elgergawy, MD   3 mL at 12/30/15 2156  . [MAR Hold] vitamin C (ASCORBIC ACID) tablet 500 mg  500 mg Oral BID Starleen Armsawood S Elgergawy, MD   500 mg at 12/30/15 2205   Facility-Administered Medications Ordered in Other Encounters  Medication Dose Route Frequency Provider Last Rate Last Dose  . albumin human 5 % solution    Continuous PRN Rokoshi T Flowers, CRNA      . ePHEDrine injection    Anesthesia Intra-op Shawnee Knappokoshi T Flowers, CRNA   15 mg at 12/31/15 1448  . ketamine (KETALAR) injection    Anesthesia  Intra-op Fransisca KaufmannMary E Meyer, CRNA   40 mg at 12/31/15 1400  . phenylephrine (NEO-SYNEPHRINE) 0.04 mg/mL in dextrose 5 % 250 mL infusion    Continuous PRN Fransisca KaufmannMary E Meyer, CRNA 15 mL/hr at 12/31/15 1414 10 mcg/min at 12/31/15 1414  . phenylephrine (NEO-SYNEPHRINE) injection    Anesthesia Intra-op Fransisca KaufmannMary E Meyer, CRNA   120 mcg at 12/31/15 1452  . propofol (DIPRIVAN) 10 mg/mL bolus/IV push    Anesthesia Intra-op Fransisca KaufmannMary E Meyer, CRNA  30 mg at 12/31/15 1400  . rocuronium [facility]) injection    Anesthesia Intra-op Fransisca Kaufmann, CRNA   30 mg at 12/31/15 1400     Discharge Medications: Please see discharge summary for a list of discharge medications.  Relevant Imaging Results:  Relevant Lab Results:   Additional Information SSN:  [id number]; Dialysis:  [facility] on Tuesday, Thursday, Saturday; Left BKA  Volney American, LCSW

## 2015-12-31 NOTE — Progress Notes (Signed)
Subjective:  No cos / daughter in room /noted for Dr. Yong Channel AKA today/HD tomor.  Objective Vital signs in last 24 hours: Vitals:   12/30/15 1449 12/30/15 1815 12/30/15 1948 12/31/15 0537  BP:  (!) 101/54 (!) 93/47 (!) 88/43  Pulse:  103 (!) 101 93  Resp:  14 15 18   Temp:   98.2 F (36.8 C) 97.6 F (36.4 C)  TempSrc:   Oral Oral  SpO2:  97% 100% 100%  Weight: 36.3 kg (80 lb)  36.3 kg (80 lb)   Height: 5\' 3"  (1.6 m)  5\' 3"  (1.6 m)    Weight change:   Physical Exam: General: alert Thin AAF NAD chronically ill  Heart: RRR, no mur, rub ,or gallop Lungs: CTA  , nonlabored breathing Abdomen: bs Pos. ND, NT Extremities: L BKA / Foul smelling R Lower extrem. With Gangrenous   Toes  Dialysis Access: pos bruit LUA AVF   Dialyzes at [facility] on TTS Primary Nephrologist Dr. Arrie Aran EDW 35kg Heparin 1500 units Access lt BCF  Problem/Plan: 1.  Right Foot Gangrenne- Noted  eval by Dr. Arbie Cookey with plans for AKA Dr Imogene Burn later today / on Vanciomycibn and Zosyn  IV 2. ESRD -  TTS  HD  At [facility] ,  K =4.5  And Volume ok  To wait for HD tomor with Surg today ./ noted past admit with sepsis dw Palliative Care/  daughter raised ? Again today  / Ms Eshelman  Stated   morning not ready to stop HD yet. 3.  HTN/volume - lowish BP  With Infection , asymptomatic/  No op bp meds /  Wt  yest 36.3 (BEDWT)  EDW 35kg / 100% O2 SAt. / no excess vol on exam  4. Anemia - HGB 7.9  / esa on hd  (check op records) 5. Secondary hyperparathyroidism - Renvela as [place] Faster, PA-C [facility] Beeper [phone] 12/31/2015,10:34 AM  LOS: 1 day   Labs: Basic Metabolic Panel:  Recent Labs Lab 12/30/15 1616 12/30/15 2036 12/31/15 0238  NA 132*  --  131*  K 4.6  --  4.5  CL 96*  --  95*  CO2 24  --  25  GLUCOSE 161*  --  94  BUN 39*  --  36*  CREATININE 4.16* 4.28* 4.44*  CALCIUM 8.5*  --  8.4*   Liver Function Tests: No results for input(s): AST, ALT, ALKPHOS, BILITOT, PROT,  ALBUMIN in the last 168 hours. No results for input(s): LIPASE, AMYLASE in the last 168 hours. No results for input(s): AMMONIA in the last 168 hours. CBC:  Recent Labs Lab 12/30/15 1616 12/30/15 2036 12/31/15 0238  WBC 19.9* 20.0* 17.6*  NEUTROABS 18.2*  --   --   HGB 8.8* 8.4* 7.9*  HCT 26.9* 25.7* 23.9*  MCV 82.5 81.6 81.0  PLT 316 329 312   Cardiac Enzymes: No results for input(s): CKTOTAL, CKMB, CKMBINDEX, TROPONINI in the last 168 hours. CBG:  Recent Labs Lab 12/30/15 2013 12/31/15 0011 12/31/15 0422 12/31/15 0800  GLUCAP 229* 128* 100* 95    Studies/Results: Dg Foot Complete Right  Result Date: 12/30/2015 CLINICAL DATA:  Necrotic toes of the right foot. EXAM: RIGHT FOOT COMPLETE - 3+ VIEW COMPARISON:  None. FINDINGS: There is extensive arterial vascular calcification in the foot. There is gas in the soft tissues throughout the plantar aspect of the foot, most prominent in the forefoot but extending under the plantar aspect of the  calcaneus. No discrete bone destruction or fractures. 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 Ankle joint appears normal. 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 No radiographic evidence of osteomyelitis. Extensive arterial vascular calcifications in the foot and ankle. Electronically Signed   By: Francene BoyersJames  Maxwell M.D.   On: 12/30/2015 16:37   Medications:  . aspirin EC  81 mg Oral Daily  . feeding supplement (ENSURE ENLIVE)  237 mL Oral BID BM  . fluticasone  1 spray Each Nare BID  . heparin  5,000 Units Subcutaneous Q8H  . insulin aspart  0-9 Units Subcutaneous Q4H  . lidocaine-prilocaine  1 application Topical Q T,Th,Sa-HD  . loratadine  10 mg Oral Daily  . pantoprazole  40 mg Oral Daily  . piperacillin-tazobactam (ZOSYN)  IV  3.375 g Intravenous Q12H  . pravastatin  20 mg Oral QPM  . senna  1 tablet Oral BID  . sevelamer carbonate  1,600 mg Oral TID WC  . sodium chloride flush  3 mL Intravenous  Q12H  . ascorbic acid  500 mg Oral BID    I have seen and examined this patient and agree with plan and assessment in the above note with renal recommendations/intervention highlighted.  Jomarie LongsJoseph A Debbie Yearick,MD 12/31/2015 11:03 AM

## 2016-01-01 ENCOUNTER — Encounter (HOSPITAL_COMMUNITY): Payer: Self-pay | Admitting: Vascular Surgery

## 2016-01-01 LAB — CBC
HCT: 23.8 % — ABNORMAL LOW (ref 36.0–46.0)
Hemoglobin: 7.7 g/dL — ABNORMAL LOW (ref 12.0–15.0)
MCH: 26.5 pg (ref 26.0–34.0)
MCHC: 32.4 g/dL (ref 30.0–36.0)
MCV: 81.8 fL (ref 78.0–100.0)
PLATELETS: 353 10*3/uL (ref 150–400)
RBC: 2.91 MIL/uL — ABNORMAL LOW (ref 3.87–5.11)
RDW: 17.1 % — AB (ref 11.5–15.5)
WBC: 19.1 10*3/uL — AB (ref 4.0–10.5)

## 2016-01-01 LAB — GLUCOSE, CAPILLARY
GLUCOSE-CAPILLARY: 169 mg/dL — AB (ref 65–99)
Glucose-Capillary: 93 mg/dL (ref 65–99)
Glucose-Capillary: 135 mg/dL — ABNORMAL HIGH (ref 65–99)
Glucose-Capillary: 161 mg/dL — ABNORMAL HIGH (ref 65–99)

## 2016-01-01 LAB — RENAL FUNCTION PANEL
ALBUMIN: 2.2 g/dL — AB (ref 3.5–5.0)
Anion gap: 14 (ref 5–15)
BUN: 47 mg/dL — AB (ref 6–20)
CHLORIDE: 96 mmol/L — AB (ref 101–111)
CO2: 22 mmol/L (ref 22–32)
CREATININE: 5.57 mg/dL — AB (ref 0.44–1.00)
Calcium: 8.4 mg/dL — ABNORMAL LOW (ref 8.9–10.3)
GFR calc Af Amer: 8 mL/min — ABNORMAL LOW (ref >60)
GFR, EST NON AFRICAN AMERICAN: 7 mL/min — AB (ref >60)
Glucose, Bld: 136 mg/dL — ABNORMAL HIGH (ref 65–99)
PHOSPHORUS: 7.4 mg/dL — AB (ref 2.5–4.6)
Potassium: 6 mmol/L — ABNORMAL HIGH (ref 3.5–5.1)
SODIUM: 132 mmol/L — AB (ref 135–145)

## 2016-01-01 MED ORDER — HYDROMORPHONE HCL 1 MG/ML IJ SOLN
1.0000 mg | INTRAMUSCULAR | Status: DC | PRN
  Administered 2016-01-02 – 2016-01-04 (×5): 1 mg via INTRAVENOUS
  Filled 2016-01-01 (×6): qty 1

## 2016-01-01 MED ORDER — GABAPENTIN 100 MG PO CAPS
100.0000 mg | ORAL_CAPSULE | Freq: Two times a day (BID) | ORAL | Status: DC
  Administered 2016-01-03: 100 mg via ORAL
  Filled 2016-01-01 (×5): qty 1

## 2016-01-01 MED ORDER — BOOST / RESOURCE BREEZE PO LIQD
1.0000 | Freq: Three times a day (TID) | ORAL | Status: DC
  Administered 2016-01-03 (×2): 1 via ORAL

## 2016-01-01 MED ORDER — VANCOMYCIN HCL 500 MG IV SOLR
500.0000 mg | Freq: Once | INTRAVENOUS | Status: AC
  Administered 2016-01-01: 500 mg via INTRAVENOUS
  Filled 2016-01-01: qty 500

## 2016-01-01 NOTE — Consult Note (Signed)
   [facility] CM Inpatient Consult   01/01/2016  Elizabeth [name] 1945-05-25 [id number]    Patient screened for potential [facility] Care Management services. Chart reviewed. Noted current discharge plan appears to be for SNF.  There are no identifiable [facility] Care Management needs at this time. If patient's post hospital needs change, please place a [facility] Care Management consult. For questions please contact:  Raiford Noble, MSN-Ed, RN,BSN [facility] Liaison [phone]

## 2016-01-01 NOTE — Procedures (Signed)
I was present at this dialysis session. I have reviewed the session itself and made appropriate changes.   Filed Weights   12/30/15 1449 12/30/15 1948 01/01/16 0700  Weight: 36.3 kg (80 lb) 36.3 kg (80 lb) 32.5 kg (71 lb 10.4 oz)     Recent Labs Lab 01/01/16 0723  NA 132*  K 6.0*  CL 96*  CO2 22  GLUCOSE 136*  BUN 47*  CREATININE 5.57*  CALCIUM 8.4*  PHOS 7.4*     Recent Labs Lab 12/30/15 1616 12/30/15 2036 12/31/15 0238 01/01/16 0724  WBC 19.9* 20.0* 17.6* 19.1*  NEUTROABS 18.2*  --   --   --   HGB 8.8* 8.4* 7.9* 7.7*  HCT 26.9* 25.7* 23.9* 23.8*  MCV 82.5 81.6 81.0 81.8  PLT 316 329 312 353    Scheduled Meds: . aspirin EC  81 mg Oral Daily  . feeding supplement (ENSURE ENLIVE)  237 mL Oral BID BM  . ferric gluconate (FERRLECIT/NULECIT) IV  125 mg Intravenous Q M,W,F-HD  . fluticasone  1 spray Each Nare BID  . heparin  5,000 Units Subcutaneous Q8H  . insulin aspart  0-9 Units Subcutaneous Q4H  . lidocaine-prilocaine  1 application Topical Q T,Th,Sa-HD  . loratadine  10 mg Oral Daily  . pantoprazole  40 mg Oral Daily  . piperacillin-tazobactam (ZOSYN)  IV  3.375 g Intravenous Q12H  . pravastatin  20 mg Oral QPM  . senna  1 tablet Oral BID  . sevelamer carbonate  1,600 mg Oral TID WC  . sodium chloride flush  3 mL Intravenous Q12H  . ascorbic acid  500 mg Oral BID   Continuous Infusions: . sodium chloride 35 mL/hr (12/31/15 1315)   PRN Meds:.acetaminophen **OR** acetaminophen, HYDROmorphone (DILAUDID) injection, oxyCODONE-acetaminophen, promethazine    Dialyzes at [facility] TTS Primary Nephrologist Dr. Arrie Aran EDW 35kg Heparin 1500 units Access lt BCF  Problem/Plan: 1.  Right Foot Gangrenne- s/p right AKA and doing well.  on Vanciomycibn and Zosyn  IV 2. ESRD -  TTS  HD  At [facility] ,  K =4.5  And Volume stable, will need to re-evaluate edw as she is 2.5 kg below edw today following amputation.  Daughter raised the issue of stopping dialysis, however Ms.  Hartland stated that she is not ready to stop so will continue with HD as long as she tolerates it.  Off schedule but will get back on schedule next week. 3.  HTN/volume - stable for now.  No evidence of volume overload.   4. Anemia - HGB 7.9  / esa on hd  (check op records) 5. Secondary hyperparathyroidism - Renvela as Binder 6. Disposition- she will need PT/OT and follow decubitus ulcer  Irena Cords,  MD 01/01/2016, 9:34 AM

## 2016-01-01 NOTE — Progress Notes (Signed)
Initial Nutrition Assessment  DOCUMENTATION CODES:   Severe malnutrition in context of chronic illness, Underweight  INTERVENTION:    Boost Breeze po TID, each supplement provides 250 kcal and 9 grams of protein  NUTRITION DIAGNOSIS:   Malnutrition related to chronic illness as evidenced by severe depletion of body fat, severe depletion of muscle mass  GOAL:   Patient will meet greater than or equal to 90% of their needs  MONITOR:   PO intake, Supplement acceptance, Labs, Weight trends, Skin, I & O's  REASON FOR ASSESSMENT:   Malnutrition Screening Tool  ASSESSMENT:    70 y.o. Female with PMH significant of ESRD on Tu/Th/Sat HD, HLD, h/o CVA, chronic indwelling foley, DM, PVD s/p amputation , patient was sent from SNF facility for increase in discoloration and necrosis of her toes on the right foot, progressive symptoms over one week, patient denies any fever or chills, but reports it was weakness and poor appetite, patient with history of gangrene and left lower extremity status post amputation by Dr. early in March 2016.  Pt s/p procedure 11/9: RIGHT ABOVE-THE-KNEE AMPUTATION   Pt is from [facility]. Currently on a Clear Liquid diet >> having some nausea. Identified with severe malnutrition during previous admission >> ongoing. Nutrient needs increased for wound and post-op healing. CBG's [id number].  Diet Order:  Diet clear liquid Room service appropriate? Yes; Fluid consistency: Thin  Skin:  Wound (see comment) (Stage IV to sacrum)  Last BM:  11/7  Height:   Ht Readings from Last 1 Encounters:  12/30/15 5\' 3"  (1.6 m)    Weight:   Wt Readings from Last 1 Encounters:  01/01/16 70 lb 15.8 oz (32.2 kg)    Ideal Body Weight:  52.2 kg  BMI:  Body mass index is 12.57 kg/m.  Estimated Nutritional Needs:   Kcal:  1100-1300  Protein:  55-65 gm  Fluid:  >/= 1.5 L  EDUCATION NEEDS:   No education needs identified at this time  Maureen Chatters,  RD, LDN Pager #: [phone] After-Hours Pager #: [phone]

## 2016-01-01 NOTE — Progress Notes (Signed)
[facility]                                                                              Patient Demographics  Elizabeth [name], is a 70 y.o. female, DOB - 07/02/1945, NWG:[id number]  Admit date - 12/30/2015   Admitting Physician Starleen Armsawood S Elgergawy, MD  Outpatient Primary MD for the patient is Kirt Boysarter, Monica, DO  Outpatient specialists:   LOS - 2  days    No chief complaint on file.      Brief summary   Elizabeth [name]  is a 70 y.o. female, with medical history significant of ESRD on Tu/Th/Sat HD, HLD, h/o CVA, chronic indwelling foley, DM, PVD s/p amputation , patient was sent from SNF facility for increase in discoloration and necrosis of her toes on the right foot, progressive symptoms over one week, patient denies any fever or chills, but reports it was weakness and poor appetite, patient with history of gangrene and left lower extremity status post amputation by Dr. early in March 2016. - in ED workup significant for leukocytosis of 19.9, anemia with hemoglobin of 8.8, creatinine of 4.16, lactic acid within normal limits at 1.3, foot x-ray significant of gas gangrene and right foot, patient started on IV vancomycin and Zosyn.   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 Arbie CookeyEarly, underwent right AKA on 11/9  - Complaining of phantom pain in the right lower extremity, started on Neurontin, continue pain control   End-stage renal disease - On hemodialysis TTS, noncompliant, missed hemodialysis 11/4 - Nephrology consulted, plan HD per schedule  Diabetes mellitus - Continue with insulin sliding scale, not on Lantus anymore  Dyslipidemia - Continue statin  CAD  - Most recent cath in February 2016 with evidence of three-vessel  disease, - denies any chest pain or shortness of breath, continue with aspirin  Ischemic cardiac erythema with chronic systolic/diastolic CHF - Volume management with HD  PAD - Status post left BKA on aspirin daily  Urinary retention - She has long-term Foley  Stage IV sacral ulcer - wound  Care consult   Anemia - Anemia of end-stage renal disease and chronic illness, baseline is around 9 - may need Aranesp or IV iron.  Code Status: DNR  DVT Prophylaxis:   heparin  Family Communication: Discussed in detail with the patient, all imaging results, lab results explained to the patient and daughter at the bedside Disposition Plan: Hopefully DC to skilled nursing facility, [facility] on Monday  Time Spent in minutes  25 minutes  Procedures:   Consultants:   Nephrology Vascular surgery  Antimicrobials:  Vancomycin Zosyn  Medications  Scheduled Meds: . aspirin EC  81 mg Oral Daily  . feeding supplement (ENSURE ENLIVE)  237 mL Oral BID BM  . ferric gluconate (FERRLECIT/NULECIT) IV  125 mg Intravenous Q M,W,F-HD  . fluticasone  1 spray Each Nare BID  . gabapentin  100 mg Oral BID  . heparin  5,000 Units Subcutaneous Q8H  . insulin aspart  0-9 Units Subcutaneous Q4H  . lidocaine-prilocaine  1 application Topical Q T,Th,Sa-HD  . loratadine  10 mg Oral Daily  . pantoprazole  40 mg Oral Daily  . piperacillin-tazobactam (ZOSYN)  IV  3.375 g Intravenous Q12H  . pravastatin  20 mg Oral QPM  . senna  1 tablet Oral BID  . sevelamer carbonate  1,600 mg Oral TID WC  . sodium chloride flush  3 mL Intravenous Q12H  . vancomycin  500 mg Intravenous Once  . ascorbic acid  500 mg Oral BID   Continuous Infusions: . sodium chloride 35 mL/hr (12/31/15 1315)   PRN Meds:.acetaminophen **OR** acetaminophen, HYDROmorphone (DILAUDID) injection, oxyCODONE-acetaminophen, promethazine   Antibiotics   Anti-infectives    Start     Dose/Rate Route Frequency Ordered Stop   01/01/16  1600  vancomycin (VANCOCIN) 500 mg in sodium chloride 0.9 % 100 mL IVPB     500 mg 100 mL/hr over 60 Minutes Intravenous  Once 01/01/16 1238     12/31/15 0600  piperacillin-tazobactam (ZOSYN) IVPB 3.375 g     3.375 g 12.5 mL/hr over 240 Minutes Intravenous Every 12 hours 12/30/15 2006     12/30/15 1730  piperacillin-tazobactam (ZOSYN) IVPB 3.375 g     3.375 g 12.5 mL/hr over 240 Minutes Intravenous  Once 12/30/15 1722 12/30/15 2239   12/30/15 1730  vancomycin (VANCOCIN) IVPB 1000 mg/200 mL premix     1,000 mg 200 mL/hr over 60 Minutes Intravenous  Once 12/30/15 1722 12/30/15 1958        Subjective:   Elizabeth [name] was seen and examined today.  Patient denies dizziness, chest pain, shortness of breath, abdominal pain, N/V/D/C, new weakness, numbess, tingling. No acute events overnight.    Objective:   Vitals:   01/01/16 1000 01/01/16 1030 01/01/16 1045 01/01/16 1300  BP: (!) 106/46 (!) 113/58 115/64 111/62  Pulse: 90 92 91 (!) 102  Resp:   16 17  Temp:   97.6 F (36.4 C) 98.7 F (37.1 C)  TempSrc:   Oral Oral  SpO2:   99% 98%  Weight:   32.2 kg (70 lb 15.8 oz)   Height:        Intake/Output Summary (Last 24 hours) at 01/01/16 1348 Last data filed at 01/01/16 1045  Gross per 24 hour  Intake                0 ml  Output              350 ml  Net             -350 ml     Wt Readings from Last 3 Encounters:  01/01/16 32.2 kg (70 lb 15.8 oz)  12/29/15 36.7 kg (80 lb 12.8 oz)  12/17/15 35.1 kg (77 lb 6 oz)     Exam  General: Alert and oriented x 3, NAD  HEENT:  .   Neck:  Cardiovascular: S1 S2 clear, RRR  Respiratory: CTAB  Gastrointestinal: Soft, nontender, nondistended, + bowel sounds  Ext: no cyanosis clubbing. Left bka. rt AKA  Neuro:   Skin:dressing intact   Psych: Normal affect and demeanor, alert and oriented x3    Data Reviewed:  I have personally reviewed following labs and imaging studies  Micro Results No results found for this or any  previous visit (from the past 240 hour(s)).  Radiology Reports Dg Chest [facility]  1 View  Result Date: 12/08/2015 CLINICAL DATA:  Sepsis and hypothermia EXAM: PORTABLE CHEST 1 VIEW COMPARISON:  Chest radiograph 03/09/2015 FINDINGS: There is calcification within the aortic arch. Small bilateral pleural effusions. The right-sided effusion has increased in size. There diffusely increased pulmonary markings throughout both lungs. No focal area of consolidation. No pneumothorax. IMPRESSION: Slightly increased size of right pleural effusion. Unchanged left pleural effusion. No focal airspace consolidation. Electronically Signed   By: Deatra Robinson M.D.   On: 12/08/2015 18:04   Dg Foot Complete Right  Result Date: 12/30/2015 CLINICAL DATA:  Necrotic toes of the right foot. EXAM: RIGHT FOOT COMPLETE - 3+ VIEW COMPARISON:  None. FINDINGS: There is extensive arterial vascular calcification in the foot. There is gas in the soft tissues throughout the plantar aspect of the foot, most prominent in the forefoot but extending under the plantar aspect of the calcaneus. No discrete bone destruction or fractures. 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 Ankle joint appears normal. 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 No radiographic evidence of osteomyelitis. Extensive arterial vascular calcifications in the foot and ankle. Electronically Signed   By: Francene Boyers M.D.   On: 12/30/2015 16:37    Lab Data:  CBC:  Recent Labs Lab 12/30/15 1616 12/30/15 2036 12/31/15 0238 01/01/16 0724  WBC 19.9* 20.0* 17.6* 19.1*  NEUTROABS 18.2*  --   --   --   HGB 8.8* 8.4* 7.9* 7.7*  HCT 26.9* 25.7* 23.9* 23.8*  MCV 82.5 81.6 81.0 81.8  PLT 316 329 312 353   Basic Metabolic Panel:  Recent Labs Lab 12/30/15 1616 12/30/15 2036 12/31/15 0238 01/01/16 0723  NA 132*  --  131* 132*  K 4.6  --  4.5 6.0*  CL 96*  --  95* 96*  CO2 24  --  25 22  GLUCOSE 161*  --  94 136*  BUN 39*   --  36* 47*  CREATININE 4.16* 4.28* 4.44* 5.57*  CALCIUM 8.5*  --  8.4* 8.4*  PHOS  --   --   --  7.4*   GFR: Estimated Creatinine Clearance: 4.7 mL/min (by C-G formula based on SCr of 5.57 mg/dL (H)). Liver Function Tests:  Recent Labs Lab 01/01/16 0723  ALBUMIN 2.2*   No results for input(s): LIPASE, AMYLASE in the last 168 hours. No results for input(s): AMMONIA in the last 168 hours. Coagulation Profile: No results for input(s): INR, PROTIME in the last 168 hours. Cardiac Enzymes: No results for input(s): CKTOTAL, CKMB, CKMBINDEX, TROPONINI in the last 168 hours. BNP (last 3 results) No results for input(s): PROBNP in the last 8760 hours. HbA1C: No results for input(s): HGBA1C in the last 72 hours. CBG:  Recent Labs Lab 12/31/15 1527 12/31/15 1959 12/31/15 2349 01/01/16 0426 01/01/16 1153  GLUCAP 107* 169* 149* 169* 93   Lipid Profile: No results for input(s): CHOL, HDL, LDLCALC, TRIG, CHOLHDL, LDLDIRECT in the last 72 hours. Thyroid Function Tests: No results for input(s): TSH, T4TOTAL, FREET4, T3FREE, THYROIDAB in the last 72 hours. Anemia Panel: No results for input(s): VITAMINB12, FOLATE, FERRITIN, TIBC, IRON, RETICCTPCT in the last 72 hours. Urine analysis:    Component Value Date/Time   COLORURINE RED (A) 12/08/2015 1746   APPEARANCEUR TURBID (A) 12/08/2015 1746   LABSPEC 1.013 12/08/2015 1746   PHURINE 8.5 (H) 12/08/2015 1746   GLUCOSEU NEGATIVE 12/08/2015 1746   HGBUR LARGE (A) 12/08/2015 1746   BILIRUBINUR SMALL (A) 12/08/2015 1746   KETONESUR NEGATIVE  12/08/2015 1746   PROTEINUR >300 (A) 12/08/2015 1746   UROBILINOGEN 0.2 06/25/2014 1436   NITRITE NEGATIVE 12/08/2015 1746   LEUKOCYTESUR LARGE (A) 12/08/2015 1746     RAI,RIPUDEEP M.D. [facility] 01/01/2016, 1:48 PM  Pager: [phone] Between 7am to 7pm - call Pager - [phone]  After 7pm go to [email or web address] - password [email or web address]  Call night coverage person covering after 7pm

## 2016-01-01 NOTE — Progress Notes (Signed)
  Progress Note    01/01/2016 2:32 PM 1 Day Post-Op  Subjective: RLE phantom pain, also having some nausea  Vitals:   01/01/16 1045 01/01/16 1300  BP: 115/64 111/62  Pulse: 91 (!) 102  Resp: 16 17  Temp: 97.6 F (36.4 C) 98.7 F (37.1 C)    Physical Exam: aaox3 Abdomen is soft RLE amputation site with bandage cdi  CBC    Component Value Date/Time   WBC 19.1 (H) 01/01/2016 0724   RBC 2.91 (L) 01/01/2016 0724   HGB 7.7 (L) 01/01/2016 0724   HCT 23.8 (L) 01/01/2016 0724   PLT 353 01/01/2016 0724   MCV 81.8 01/01/2016 0724   MCH 26.5 01/01/2016 0724   MCHC 32.4 01/01/2016 0724   RDW 17.1 (H) 01/01/2016 0724   LYMPHSABS 0.5 (L) 12/30/2015 1616   MONOABS 1.1 (H) 12/30/2015 1616   EOSABS 0.1 12/30/2015 1616   BASOSABS 0.0 12/30/2015 1616    BMET    Component Value Date/Time   NA 132 (L) 01/01/2016 0723   NA 135 (A) 07/10/2015   K 6.0 (H) 01/01/2016 0723   CL 96 (L) 01/01/2016 0723   CO2 22 01/01/2016 0723   GLUCOSE 136 (H) 01/01/2016 0723   BUN 47 (H) 01/01/2016 0723   BUN 18 07/10/2015   CREATININE 5.57 (H) 01/01/2016 0723   CALCIUM 8.4 (L) 01/01/2016 0723   GFRNONAA 7 (L) 01/01/2016 0723   GFRAA 8 (L) 01/01/2016 0723    INR    Component Value Date/Time   INR 1.21 12/08/2015 2302     Intake/Output Summary (Last 24 hours) at 01/01/16 1432 Last data filed at 01/01/16 1045  Gross per 24 hour  Intake                0 ml  Output              350 ml  Net             -350 ml     Assessment:  70 y.o. female is pod#1 R AKA  Plan: Agree with neurontin for phantom pains Will follow while inpatient   Adarryl Goldammer C. Randie Heinz, MD [facility] Office: [phone] Pager: [phone]  01/01/2016 2:32 PM

## 2016-01-01 NOTE — Progress Notes (Signed)
   01/01/16 1505  Clinical Encounter Type  Visited With Patient  Visit Type Other (Comment) ([place] consult)  Referral From Nurse  Consult/Referral To Chaplain  Spiritual Encounters  Spiritual Needs Other (Comment) (AD)  Stress Factors  Patient Stress Factors Health changes  Pt reported she already has a POA and AD.

## 2016-01-02 LAB — GLUCOSE, CAPILLARY
GLUCOSE-CAPILLARY: 150 mg/dL — AB (ref 65–99)
Glucose-Capillary: 111 mg/dL — ABNORMAL HIGH (ref 65–99)
Glucose-Capillary: 113 mg/dL — ABNORMAL HIGH (ref 65–99)
Glucose-Capillary: 134 mg/dL — ABNORMAL HIGH (ref 65–99)
Glucose-Capillary: 150 mg/dL — ABNORMAL HIGH (ref 65–99)
Glucose-Capillary: 162 mg/dL — ABNORMAL HIGH (ref 65–99)
Glucose-Capillary: 99 mg/dL (ref 65–99)

## 2016-01-02 MED ORDER — PROMETHAZINE HCL 25 MG/ML IJ SOLN
12.5000 mg | Freq: Once | INTRAMUSCULAR | Status: AC
  Administered 2016-01-02: 12.5 mg via INTRAVENOUS
  Filled 2016-01-02: qty 1

## 2016-01-02 NOTE — Progress Notes (Signed)
Pharmacy Antibiotic Note  Elizabeth [name] is a 70 y.o. female with PVD requiring BKA of L leg in 2016, ESRD on HD TTS, and DM; admitted on 12/30/2015 with gas gangrene of her R foot.  Pharmacy has been consulted for vancomycin and Zosyn dosing. Pt transferred to [facility] d/t need for dialysis.   Patient continues on vancomycin and zosyn. Currently afebrile, wbc actually up yesterday 17>>19. She received HD yesterday and a dose of vancomycin was given. Plan is to resume regular HD schedule next week, will continue to follow along.   No cx data  Plan: No change in zosyn dose Vancomycin given post-HD 11/10 Follow up HD schedule and order supplemental doses as needed.  No need for Vancomycin trough at this time  Height: 5\' 3"  (160 cm) Weight: 70 lb 15.8 oz (32.2 kg) IBW/kg (Calculated) : 52.4  Temp (24hrs), Avg:98.3 F (36.8 C), Min:98.1 F (36.7 C), Max:98.7 F (37.1 C)   Recent Labs Lab 12/30/15 1616 12/30/15 2036 12/31/15 0238 01/01/16 0723 01/01/16 0724  WBC 19.9* 20.0* 17.6*  --  19.1*  CREATININE 4.16* 4.28* 4.44* 5.57*  --     Estimated Creatinine Clearance: 4.7 mL/min (by C-G formula based on SCr of 5.57 mg/dL (H)).    No Known Allergies  Antimicrobials this admission: Vancomycin 11/8 >>  Zosyn 11/8 >>   Dose adjustments this admission: ---  Microbiology results: No cultures ordered at this time  Thank you for allowing pharmacy to be a part of this patient's care.  Sheppard Coil PharmD., BCPS Clinical Pharmacist Pager [phone] 01/02/2016 12:13 PM

## 2016-01-02 NOTE — Progress Notes (Signed)
Patient ID: Elizabeth MacleodBetty [name], female   DOB: 02/18/1946, 70 y.o.   MRN: [id number]  [facility] Progress Note    Subjective:   Feels better today   Objective:   BP (!) 97/53 (BP Location: Right Arm) Comment: RN/ notified  Pulse (!) 101 Comment: RN/Notified  Temp 98.4 F (36.9 C) (Oral)   Resp 18   Ht 5\' 3"  (1.6 m)   Wt 32.2 kg (70 lb 15.8 oz)   SpO2 100%   BMI 12.57 kg/m   Intake/Output: I/O last 3 completed shifts: In: 50 [P.O.:50] Out: 250 [Other:250]   Intake/Output this shift:  No intake/output data recorded. Weight change: -0.3 kg (-10.6 oz)  Physical Exam: WUJ:WJXBJYNWGGen:cachectic AAF in NAd CVS:no rub Resp:cta NFA:OZHYQMAbd:benign Ext:s/p right AKA, left BKA  Labs: BMET  Recent Labs Lab 12/30/15 1616 12/30/15 2036 12/31/15 0238 01/01/16 0723  NA 132*  --  131* 132*  K 4.6  --  4.5 6.0*  CL 96*  --  95* 96*  CO2 24  --  25 22  GLUCOSE 161*  --  94 136*  BUN 39*  --  36* 47*  CREATININE 4.16* 4.28* 4.44* 5.57*  ALBUMIN  --   --   --  2.2*  CALCIUM 8.5*  --  8.4* 8.4*  PHOS  --   --   --  7.4*   CBC  Recent Labs Lab 12/30/15 1616 12/30/15 2036 12/31/15 0238 01/01/16 0724  WBC 19.9* 20.0* 17.6* 19.1*  NEUTROABS 18.2*  --   --   --   HGB 8.8* 8.4* 7.9* 7.7*  HCT 26.9* 25.7* 23.9* 23.8*  MCV 82.5 81.6 81.0 81.8  PLT 316 329 312 353    @IMGRELPRIORS @ Medications:    . aspirin EC  81 mg Oral Daily  . feeding supplement  1 Container Oral TID BM  . ferric gluconate (FERRLECIT/NULECIT) IV  125 mg Intravenous Q M,W,F-HD  . fluticasone  1 spray Each Nare BID  . gabapentin  100 mg Oral BID  . heparin  5,000 Units Subcutaneous Q8H  . insulin aspart  0-9 Units Subcutaneous Q4H  . lidocaine-prilocaine  1 application Topical Q T,Th,Sa-HD  . loratadine  10 mg Oral Daily  . pantoprazole  40 mg Oral Daily  . piperacillin-tazobactam (ZOSYN)  IV  3.375 g Intravenous Q12H  . pravastatin  20 mg Oral QPM  . senna  1 tablet Oral BID  . sevelamer carbonate   1,600 mg Oral TID WC  . sodium chloride flush  3 mL Intravenous Q12H  . ascorbic acid  500 mg Oral BID   [facility]phrologist Dr. Arrie Aranoladonato EDW 35kg Heparin 1500 units Access lt BCF  Problem/Plan: 1. Right Foot Gangrenne- s/p right AKA and doing well.  on Vanciomycibn and Zosyn IV 1. Consider narrowing abx coverage 2. [facility] , K =4.5 And Volume stable, will need to re-evaluate edw as she is 2.5 kg below edw today following amputation.  Daughter raised the issue of stopping dialysis, however Elizabeth [name] stated that she is not ready to stop so will continue with HD as long as she tolerates it.   1. Off schedule but will get back on schedule next week. 3. HTN/volume - stable for now.  No evidence of volume overload.   4. Anemia - HGB 7.9 / esa on hd (check op records) 5. Secondary hyperparathyroidism - Renvela as Binder 6. Stage IV sacral ulcer- wound care consult 7. Disposition-  she will need PT/OT and follow decubitus ulcer.  Will likely need Rehab and hopefully she will be strong enough for CIR. Irena Cords, MD [facility], [facility] Pager [phone] 01/02/2016, 11:35 AM

## 2016-01-02 NOTE — Progress Notes (Signed)
  Progress Note    01/02/2016 11:50 AM 2 Days Post-Op  Subjective:  Improving phantom pain RLE  Vitals:   01/02/16 0500 01/02/16 0900  BP: (!) 95/45 (!) 97/53  Pulse: 98 (!) 101  Resp: 18 18  Temp: 98.1 F (36.7 C) 98.4 F (36.9 C)    Physical Exam: aaox3 RLE aka site healing well with staples  CBC    Component Value Date/Time   WBC 19.1 (H) 01/01/2016 0724   RBC 2.91 (L) 01/01/2016 0724   HGB 7.7 (L) 01/01/2016 0724   HCT 23.8 (L) 01/01/2016 0724   PLT 353 01/01/2016 0724   MCV 81.8 01/01/2016 0724   MCH 26.5 01/01/2016 0724   MCHC 32.4 01/01/2016 0724   RDW 17.1 (H) 01/01/2016 0724   LYMPHSABS 0.5 (L) 12/30/2015 1616   MONOABS 1.1 (H) 12/30/2015 1616   EOSABS 0.1 12/30/2015 1616   BASOSABS 0.0 12/30/2015 1616    BMET    Component Value Date/Time   NA 132 (L) 01/01/2016 0723   NA 135 (A) 07/10/2015   K 6.0 (H) 01/01/2016 0723   CL 96 (L) 01/01/2016 0723   CO2 22 01/01/2016 0723   GLUCOSE 136 (H) 01/01/2016 0723   BUN 47 (H) 01/01/2016 0723   BUN 18 07/10/2015   CREATININE 5.57 (H) 01/01/2016 0723   CALCIUM 8.4 (L) 01/01/2016 0723   GFRNONAA 7 (L) 01/01/2016 0723   GFRAA 8 (L) 01/01/2016 0723    INR    Component Value Date/Time   INR 1.21 12/08/2015 2302     Intake/Output Summary (Last 24 hours) at 01/02/16 1150 Last data filed at 01/02/16 0300  Gross per 24 hour  Intake               50 ml  Output                0 ml  Net               50 ml     Assessment:  70 y.o. female is s/p right aka  Plan: Healing well Ok for snf when medically stable   Griffin Gerrard C. Randie Heinz, MD [facility] [place] Office: [phone] Pager: [phone]  01/02/2016 11:50 AM

## 2016-01-02 NOTE — Progress Notes (Signed)
[facility]                                                                              Patient Demographics  Elizabeth [name], is a 70 y.o. female, DOB - 04/11/1945, [id number]  Admit date - 12/30/2015   Admitting Physician Starleen Armsawood S Elgergawy, MD  Outpatient Primary MD for the patient is Kirt Boysarter, Monica, DO  Outpatient specialists:   LOS - 3  days    No chief complaint on file.      Brief summary   Elizabeth [name]  is a 70 y.o. female, with medical history significant of ESRD on Tu/Th/Sat HD, HLD, h/o CVA, chronic indwelling foley, DM, PVD s/p amputation , patient was sent from SNF facility for increase in discoloration and necrosis of her toes on the right foot, progressive symptoms over one week, patient denies any fever or chills, but reports it was weakness and poor appetite, patient with history of gangrene and left lower extremity status post amputation by Dr. early in March 2016. - in ED workup significant for leukocytosis of 19.9, anemia with hemoglobin of 8.8, creatinine of 4.16, lactic acid within normal limits at 1.3, foot x-ray significant of gas gangrene and right foot, patient started on IV vancomycin and Zosyn.   Assessment & Plan    Gas gangrene - postop day #2, Status post right AKA - Patient presented with foul smelling right foot, and gangrene, patient with evidence of gas gangrene - was started on IV vancomycin and Zosyn - Vascular surgery was consulted, patient was seen by Dr. 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 - Volume management with HD  PAD - Status post left BKA on aspirin daily  Urinary retention - She has long-term Foley  Stage IV sacral ulcer - wound  Care consult   Anemia - Anemia of end-stage renal disease and chronic illness, baseline is around 9   Code Status: DNR  DVT Prophylaxis:   heparin  Family Communication: Discussed in detail with the patient, all imaging results, lab results explained to the patient  Dis[facility] Plan[facility]  Time Spent in minutes  25 minutes  Procedures:   Consultants:   Nephrology Vascular surgery  Antimicrobials:  Vancomycin Zosyn  Medications  Scheduled Meds: . aspirin EC  81 mg Oral Daily  . feeding supplement  1 Container Oral TID BM  . ferric gluconate (FERRLECIT/NULECIT) IV  125 mg Intravenous Q M,W,F-HD  . fluticasone  1 spray Each Nare BID  . gabapentin  100 mg Oral BID  . heparin  5,000 Units Subcutaneous Q8H  . insulin aspart  0-9 Units  Subcutaneous Q4H  . lidocaine-prilocaine  1 application Topical Q T,Th,Sa-HD  . loratadine  10 mg Oral Daily  . pantoprazole  40 mg Oral Daily  . piperacillin-tazobactam (ZOSYN)  IV  3.375 g Intravenous Q12H  . pravastatin  20 mg Oral QPM  . senna  1 tablet Oral BID  . sevelamer carbonate  1,600 mg Oral TID WC  . sodium chloride flush  3 mL Intravenous Q12H  . ascorbic acid  500 mg Oral BID   Continuous Infusions: . sodium chloride 35 mL/hr (12/31/15 1315)   PRN Meds:.acetaminophen **OR** acetaminophen, HYDROmorphone (DILAUDID) injection, oxyCODONE-acetaminophen, promethazine   Antibiotics   Anti-infectives    Start     Dose/Rate Route Frequency Ordered Stop   01/01/16 1600  vancomycin (VANCOCIN) 500 mg in sodium chloride 0.9 % 100 mL IVPB     500 mg 100 mL/hr over 60 Minutes Intravenous  Once 01/01/16 1238 01/01/16 1801    12/31/15 0600  piperacillin-tazobactam (ZOSYN) IVPB 3.375 g     3.375 g 12.5 mL/hr over 240 Minutes Intravenous Every 12 hours 12/30/15 2006     12/30/15 1730  piperacillin-tazobactam (ZOSYN) IVPB 3.375 g     3.375 g 12.5 mL/hr over 240 Minutes Intravenous  Once 12/30/15 1722 12/30/15 2239   12/30/15 1730  vancomycin (VANCOCIN) IVPB 1000 mg/200 mL premix     1,000 mg 200 mL/hr over 60 Minutes Intravenous  Once 12/30/15 1722 12/30/15 1958        Subjective:   Elizabeth [name] was seen and examined today. Feels a lot better today. No acute issues. Patient denies dizziness, chest pain, shortness of breath, abdominal pain, N/V/D/C, new weakness, numbess, tingling. No acute events overnight.    Objective:   Vitals:   01/01/16 1300 01/01/16 1956 01/02/16 0500 01/02/16 0900  BP: 111/62 (!) 102/51 (!) 95/45 (!) 97/53  Pulse: (!) 102 94 98 (!) 101  Resp: 17 18 18 18   Temp: 98.7 F (37.1 C) 98.1 F (36.7 C) 98.1 F (36.7 C) 98.4 F (36.9 C)  TempSrc: Oral Oral Oral Oral  SpO2: 98% 100% 100% 100%  Weight:      Height:        Intake/Output Summary (Last 24 hours) at 01/02/16 1038 Last data filed at 01/02/16 0300  Gross per 24 hour  Intake               50 ml  Output              250 ml  Net             -200 ml     Wt Readings from Last 3 Encounters:  01/01/16 32.2 kg (70 lb 15.8 oz)  12/29/15 36.7 kg (80 lb 12.8 oz)  12/17/15 35.1 kg (77 lb 6 oz)     Exam  General: Alert and oriented x 3, NAD  HEENT:  .   Neck:  Cardiovascular: S1 S2 clear, RRR  Respiratory: CTAB  Gastrointestinal: Soft, nontender, nondistended, + bowel sounds  Ext: no cyanosis clubbing. Left BKA. Rt AKA  Neuro:   Skin:dressing intact   Psych: Normal affect and demeanor, alert and oriented x3    Data Reviewed:  I have personally reviewed following labs and imaging studies  Micro Results No results found for this or any previous visit (from the past 240 hour(s)).  Radiology  Reports Dg Chest Port 1 View  Result Date: 12/08/2015 CLINICAL DATA:  Sepsis and hypothermia EXAM: PORTABLE CHEST 1 VIEW COMPARISON:  Chest radiograph 03/09/2015 FINDINGS: There is calcification within the aortic arch. Small bilateral pleural effusions. The right-sided effusion has increased in size. There diffusely increased pulmonary markings throughout both lungs. No focal area of consolidation. No pneumothorax. IMPRESSION: Slightly increased size of right pleural effusion. Unchanged left pleural effusion. No focal airspace consolidation. Electronically Signed   By: Deatra Robinson M.D.   On: 12/08/2015 18:04   Dg Foot Complete Right  Result Date: 12/30/2015 CLINICAL DATA:  Necrotic toes of the right foot. EXAM: RIGHT FOOT COMPLETE - 3+ VIEW COMPARISON:  None. FINDINGS: There is extensive arterial vascular calcification in the foot. There is gas in the soft tissues throughout the plantar aspect of the foot, most prominent in the forefoot but extending under the plantar aspect of the calcaneus. No discrete bone destruction or fractures. 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 Ankle joint appears normal. 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 No radiographic evidence of osteomyelitis. Extensive arterial vascular calcifications in the foot and ankle. Electronically Signed   By: Francene Boyers M.D.   On: 12/30/2015 16:37    Lab Data:  CBC:  Recent Labs Lab 12/30/15 1616 12/30/15 2036 12/31/15 0238 01/01/16 0724  WBC 19.9* 20.0* 17.6* 19.1*  NEUTROABS 18.2*  --   --   --   HGB 8.8* 8.4* 7.9* 7.7*  HCT 26.9* 25.7* 23.9* 23.8*  MCV 82.5 81.6 81.0 81.8  PLT 316 329 312 353   Basic Metabolic Panel:  Recent Labs Lab 12/30/15 1616 12/30/15 2036 12/31/15 0238 01/01/16 0723  NA 132*  --  131* 132*  K 4.6  --  4.5 6.0*  CL 96*  --  95* 96*  CO2 24  --  25 22  GLUCOSE 161*  --  94 136*  BUN 39*  --  36* 47*  CREATININE 4.16* 4.28* 4.44* 5.57*  CALCIUM  8.5*  --  8.4* 8.4*  PHOS  --   --   --  7.4*   GFR: Estimated Creatinine Clearance: 4.7 mL/min (by C-G formula based on SCr of 5.57 mg/dL (H)). Liver Function Tests:  Recent Labs Lab 01/01/16 0723  ALBUMIN 2.2*   No results for input(s): LIPASE, AMYLASE in the last 168 hours. No results for input(s): AMMONIA in the last 168 hours. Coagulation Profile: No results for input(s): INR, PROTIME in the last 168 hours. Cardiac Enzymes: No results for input(s): CKTOTAL, CKMB, CKMBINDEX, TROPONINI in the last 168 hours. BNP (last 3 results) No results for input(s): PROBNP in the last 8760 hours. HbA1C: No results for input(s): HGBA1C in the last 72 hours. CBG:  Recent Labs Lab 01/01/16 1557 01/01/16 2029 01/02/16 0029 01/02/16 0438 01/02/16 0822  GLUCAP 135* 161* 134* 113* 111*   Lipid Profile: No results for input(s): CHOL, HDL, LDLCALC, TRIG, CHOLHDL, LDLDIRECT in the last 72 hours. Thyroid Function Tests: No results for input(s): TSH, T4TOTAL, FREET4, T3FREE, THYROIDAB in the last 72 hours. Anemia Panel: No results for input(s): VITAMINB12, FOLATE, FERRITIN, TIBC, IRON, RETICCTPCT in the last 72 hours. Urine analysis:    Component Value Date/Time   COLORURINE RED (A) 12/08/2015 1746   APPEARANCEUR TURBID (A) 12/08/2015 1746   LABSPEC 1.013 12/08/2015 1746   PHURINE 8.5 (H) 12/08/2015 1746   GLUCOSEU NEGATIVE 12/08/2015 1746   HGBUR LARGE (A) 12/08/2015 1746   BILIRUBINUR SMALL (A) 12/08/2015 1746   KETONESUR NEGATIVE 12/08/2015 1746   PROTEINUR >300 (A) 12/08/2015 1746   UROBILINOGEN 0.2 06/25/2014 1436   NITRITE NEGATIVE  12/08/2015 1746   LEUKOCYTESUR LARGE (A) 12/08/2015 1746     Aziel Morgan M.D. [facility] 01/02/2016, 10:38 AM  Pager: [phone] Between 7am to 7pm - call Pager - [phone]  After 7pm go to [email or web address] - password [email or web address]  Call night coverage person covering after 7pm

## 2016-01-02 NOTE — Progress Notes (Signed)
Called re: nausea.  Patient is apparently refusing all pills.  Nursing asking for an IV nausea medication.  Will give 1 time dose of low dose phenergan IV.  Pill swallowing could be addressed in the morning with SLP consult if deemed necessary.   Debe Coder, MD

## 2016-01-03 LAB — GLUCOSE, CAPILLARY
GLUCOSE-CAPILLARY: 101 mg/dL — AB (ref 65–99)
GLUCOSE-CAPILLARY: 210 mg/dL — AB (ref 65–99)
GLUCOSE-CAPILLARY: 165 mg/dL — AB (ref 65–99)

## 2016-01-03 MED ORDER — METOCLOPRAMIDE HCL 5 MG/ML IJ SOLN
5.0000 mg | Freq: Four times a day (QID) | INTRAMUSCULAR | Status: DC
  Administered 2016-01-03: 5 mg via INTRAVENOUS
  Filled 2016-01-03: qty 2

## 2016-01-03 MED ORDER — METOCLOPRAMIDE HCL 5 MG/ML IJ SOLN: 10.0000 mg | Freq: Four times a day (QID) | INTRAMUSCULAR | Status: DC

## 2016-01-03 MED ORDER — METOCLOPRAMIDE HCL 5 MG/5ML PO SOLN: 10.0000 mg | Freq: Four times a day (QID) | ORAL | Status: DC

## 2016-01-03 NOTE — Progress Notes (Signed)
[facility]                                                                              Patient Demographics  Elizabeth [name], is a 70 y.o. female, DOB - [birth year], ZOX:[id number]  Admit date - 12/30/2015   Admitting Physician Starleen Arms, MD  Outpatient Primary MD for the patient is Kirt Boys, DO  Outpatient specialists:   LOS - 4  days    No chief complaint on file.      Brief summary   Elizabeth [name]  is a 70 y.o. female, with medical history significant of ESRD on Tu/Th/Sat HD, HLD, h/o CVA, chronic indwelling foley, DM, PVD s/p amputation , patient was sent from SNF facility for increase in discoloration and necrosis of her toes on the right foot, progressive symptoms over one week, patient denies any fever or chills, but reports it was weakness and poor appetite, patient with history of gangrene and left lower extremity status post amputation by Dr. early in March 2016. - in ED workup significant for leukocytosis of 19.9, anemia with hemoglobin of 8.8, creatinine of 4.16, lactic acid within normal limits at 1.3, foot x-ray significant of gas gangrene and right foot, patient started on IV vancomycin and Zosyn.   Assessment & Plan    Gas gangrene - postop day #3,  Status post right AKA - Patient presented with foul smelling right foot, and gangrene, patient with evidence of gas gangrene - Discontinue antibiotics - Vascular surgery was consulted, patient was seen by Dr. Arbie Cookey, underwent right AKA on 11/9  - Pain much better controlled today, continue Neurontin  - Stable from vascular surgery standpoint to DC to snf  Nausea - Unclear etiology, discontinue antibiotics, ? Gastroparesis - Placed on Reglan.   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 - Volume management with HD  PAD - Status post left BKA on aspirin daily  Urinary retention - She has long-term Foley  Stage IV sacral ulcer - wound  Care consult   Anemia - Anemia of end-stage renal disease and chronic illness, baseline is around 9   Code Status: DNR  DVT Prophylaxis:   heparin  Family Communication: Discussed in detail with the patient, all imaging results, lab results explained to the patient  Disposition Plan: Hopefully DC to skilled nursing facility, [facility] on Monday after hemodialysis  Time Spent in minutes  25 minutes  Procedures:   Consultants:   Nephrology Vascular surgery  Antimicrobials:  Vancomycin Zosyn  Medications  Scheduled Meds: . aspirin EC  81 mg Oral Daily  . feeding supplement  1 Container Oral TID BM  . ferric gluconate (FERRLECIT/NULECIT) IV  125 mg Intravenous Q M,W,F-HD  . fluticasone  1 spray Each Nare BID  .  gabapentin  100 mg Oral BID  . heparin  5,000 Units Subcutaneous Q8H  . insulin aspart  0-9 Units Subcutaneous Q4H  . loratadine  10 mg Oral Daily  . metoCLOPramide (REGLAN) injection  5 mg Intravenous Q6H  . pantoprazole  40 mg Oral Daily  . pravastatin  20 mg Oral QPM  . senna  1 tablet Oral BID  . sevelamer carbonate  1,600 mg Oral TID WC  . sodium chloride flush  3 mL Intravenous Q12H  . ascorbic acid  500 mg Oral BID   Continuous Infusions: . sodium chloride 35 mL/hr (12/31/15 1315)   PRN Meds:.acetaminophen **OR** acetaminophen, HYDROmorphone (DILAUDID) injection, oxyCODONE-acetaminophen, promethazine   Antibiotics   Anti-infectives    Start     Dose/Rate Route Frequency Ordered Stop   01/01/16 1600  vancomycin (VANCOCIN) 500 mg in sodium chloride 0.9 % 100  mL IVPB     500 mg 100 mL/hr over 60 Minutes Intravenous  Once 01/01/16 1238 01/01/16 1801   12/31/15 0600  piperacillin-tazobactam (ZOSYN) IVPB 3.375 g  Status:  Discontinued     3.375 g 12.5 mL/hr over 240 Minutes Intravenous Every 12 hours 12/30/15 2006 01/03/16 1046   12/30/15 1730  piperacillin-tazobactam (ZOSYN) IVPB 3.375 g     3.375 g 12.5 mL/hr over 240 Minutes Intravenous  Once 12/30/15 1722 12/30/15 2239   12/30/15 1730  vancomycin (VANCOCIN) IVPB 1000 mg/200 mL premix     1,000 mg 200 mL/hr over 60 Minutes Intravenous  Once 12/30/15 1722 12/30/15 1958        Subjective:   Evenie Rothert was seen and examined today. Not feeling good this morning, complaining of nausea and refusing oral medications.  Patient denies dizziness, chest pain, shortness of breath, abdominal pain, new weakness, numbess, tingling.     Objective:   Vitals:   01/02/16 0900 01/02/16 1200 01/02/16 2000 01/03/16 0430  BP: (!) 97/53 (!) 97/50 (!) 86/40 (!) 100/58  Pulse: (!) 101 92 94 95  Resp: 18 19 18 16   Temp: 98.4 F (36.9 C) 98.1 F (36.7 C) 98 F (36.7 C) 97.4 F (36.3 C)  TempSrc: Oral Oral Oral Oral  SpO2: 100% 100% 99% 100%  Weight:      Height:        Intake/Output Summary (Last 24 hours) at 01/03/16 1047 Last data filed at 01/02/16 1900  Gross per 24 hour  Intake              220 ml  Output                0 ml  Net              220 ml     Wt Readings from Last 3 Encounters:  01/01/16 32.2 kg (70 lb 15.8 oz)  12/29/15 36.7 kg (80 lb 12.8 oz)  12/17/15 35.1 kg (77 lb 6 oz)     Exam  General: Alert and oriented x 3, NAD, Feeling sick  HEENT:  .   Neck:  Cardiovascular: S1 S2 clear, RRR  Respiratory: CTAB  Gastrointestinal: Soft, nontender, nondistended, + bowel sounds  Ext: no cyanosis clubbing. Left BKA. Rt AKA  Neuro:   Skin:dressing intact   Psych: Normal affect and demeanor, alert and oriented x3    Data Reviewed:  I have personally reviewed  following labs and imaging studies  Micro Results No results found for this or any previous visit (from the past 240 hour(s)).  Radiology Reports Dg  Chest Port 1 View  Result Date: 12/08/2015 CLINICAL DATA:  Sepsis and hypothermia EXAM: PORTABLE CHEST 1 VIEW COMPARISON:  Chest radiograph 03/09/2015 FINDINGS: There is calcification within the aortic arch. Small bilateral pleural effusions. The right-sided effusion has increased in size. There diffusely increased pulmonary markings throughout both lungs. No focal area of consolidation. No pneumothorax. IMPRESSION: Slightly increased size of right pleural effusion. Unchanged left pleural effusion. No focal airspace consolidation. Electronically Signed   By: Deatra Robinson M.D.   On: 12/08/2015 18:04   Dg Foot Complete Right  Result Date: 12/30/2015 CLINICAL DATA:  Necrotic toes of the right foot. EXAM: RIGHT FOOT COMPLETE - 3+ VIEW COMPARISON:  None. FINDINGS: There is extensive arterial vascular calcification in the foot. There is gas in the soft tissues throughout the plantar aspect of the foot, most prominent in the forefoot but extending under the plantar aspect of the calcaneus. No discrete bone destruction or fractures. 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 Ankle joint appears normal. 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 No radiographic evidence of osteomyelitis. Extensive arterial vascular calcifications in the foot and ankle. Electronically Signed   By: Francene Boyers M.D.   On: 12/30/2015 16:37    Lab Data:  CBC:  Recent Labs Lab 12/30/15 1616 12/30/15 2036 12/31/15 0238 01/01/16 0724  WBC 19.9* 20.0* 17.6* 19.1*  NEUTROABS 18.2*  --   --   --   HGB 8.8* 8.4* 7.9* 7.7*  HCT 26.9* 25.7* 23.9* 23.8*  MCV 82.5 81.6 81.0 81.8  PLT 316 329 312 353   Basic Metabolic Panel:  Recent Labs Lab 12/30/15 1616 12/30/15 2036 12/31/15 0238 01/01/16 0723  NA 132*  --  131* 132*  K 4.6  --  4.5  6.0*  CL 96*  --  95* 96*  CO2 24  --  25 22  GLUCOSE 161*  --  94 136*  BUN 39*  --  36* 47*  CREATININE 4.16* 4.28* 4.44* 5.57*  CALCIUM 8.5*  --  8.4* 8.4*  PHOS  --   --   --  7.4*   GFR: Estimated Creatinine Clearance: 4.7 mL/min (by C-G formula based on SCr of 5.57 mg/dL (H)). Liver Function Tests:  Recent Labs Lab 01/01/16 0723  ALBUMIN 2.2*   No results for input(s): LIPASE, AMYLASE in the last 168 hours. No results for input(s): AMMONIA in the last 168 hours. Coagulation Profile: No results for input(s): INR, PROTIME in the last 168 hours. Cardiac Enzymes: No results for input(s): CKTOTAL, CKMB, CKMBINDEX, TROPONINI in the last 168 hours. BNP (last 3 results) No results for input(s): PROBNP in the last 8760 hours. HbA1C: No results for input(s): HGBA1C in the last 72 hours. CBG:  Recent Labs Lab 01/02/16 1244 01/02/16 1614 01/02/16 2022 01/02/16 2331 01/03/16 0425  GLUCAP 150* 150* 162* 99 101*   Lipid Profile: No results for input(s): CHOL, HDL, LDLCALC, TRIG, CHOLHDL, LDLDIRECT in the last 72 hours. Thyroid Function Tests: No results for input(s): TSH, T4TOTAL, FREET4, T3FREE, THYROIDAB in the last 72 hours. Anemia Panel: No results for input(s): VITAMINB12, FOLATE, FERRITIN, TIBC, IRON, RETICCTPCT in the last 72 hours. Urine analysis:    Component Value Date/Time   COLORURINE RED (A) 12/08/2015 1746   APPEARANCEUR TURBID (A) 12/08/2015 1746   LABSPEC 1.013 12/08/2015 1746   PHURINE 8.5 (H) 12/08/2015 1746   GLUCOSEU NEGATIVE 12/08/2015 1746   HGBUR LARGE (A) 12/08/2015 1746   BILIRUBINUR SMALL (A) 12/08/2015 1746  KETONESUR NEGATIVE 12/08/2015 1746   PROTEINUR >300 (A) 12/08/2015 1746   UROBILINOGEN 0.2 06/25/2014 1436   NITRITE NEGATIVE 12/08/2015 1746   LEUKOCYTESUR LARGE (A) 12/08/2015 1746     RAI,RIPUDEEP M.D. [facility] 01/03/2016, 10:47 AM  Pager: [phone] Between 7am to 7pm - call Pager - [phone]  After 7pm go to  [email or web address] - password [email or web address]  Call night coverage person covering after 7pm

## 2016-01-03 NOTE — Progress Notes (Signed)
Patient ID: Elizabeth [name], female   DOB: 01/12/1946, 70 y.o.   MRN: [id number]  [facility] Progress Note    Subjective:   C/o Nausea this am and refused po meds.  Still feels "sick"   Objective:   BP (!) 100/58 (BP Location: Right Arm)   Pulse 95   Temp 97.4 F (36.3 C) (Oral)   Resp 16   Ht 5\' 3"  (1.6 m)   Wt 32.2 kg (70 lb 15.8 oz)   SpO2 100%   BMI 12.57 kg/m   Intake/Output: I/O last 3 completed shifts: In: 220 [P.O.:120; I.V.:50; IV Piggyback:50] Out: 0    Intake/Output this shift:  No intake/output data recorded. Weight change:   Physical Exam: NWG:NFAOZHYQMGen:cachectic AAF in mild distress CVS:no rub Resp:cta VHQ:IONGEXAbd:benign Ext:s/p left BKA, RAKA  Labs: BMET  Recent Labs Lab 12/30/15 1616 12/30/15 2036 12/31/15 0238 01/01/16 0723  NA 132*  --  131* 132*  K 4.6  --  4.5 6.0*  CL 96*  --  95* 96*  CO2 24  --  25 22  GLUCOSE 161*  --  94 136*  BUN 39*  --  36* 47*  CREATININE 4.16* 4.28* 4.44* 5.57*  ALBUMIN  --   --   --  2.2*  CALCIUM 8.5*  --  8.4* 8.4*  PHOS  --   --   --  7.4*   CBC  Recent Labs Lab 12/30/15 1616 12/30/15 2036 12/31/15 0238 01/01/16 0724  WBC 19.9* 20.0* 17.6* 19.1*  NEUTROABS 18.2*  --   --   --   HGB 8.8* 8.4* 7.9* 7.7*  HCT 26.9* 25.7* 23.9* 23.8*  MCV 82.5 81.6 81.0 81.8  PLT 316 329 312 353    @IMGRELPRIORS @ Medications:    . aspirin EC  81 mg Oral Daily  . feeding supplement  1 Container Oral TID BM  . ferric gluconate (FERRLECIT/NULECIT) IV  125 mg Intravenous Q M,W,F-HD  . fluticasone  1 spray Each Nare BID  . gabapentin  100 mg Oral BID  . heparin  5,000 Units Subcutaneous Q8H  . insulin aspart  0-9 Units Subcutaneous Q4H  . lidocaine-prilocaine  1 application Topical Q T,Th,Sa-HD  . loratadine  10 mg Oral Daily  . metoCLOPramide (REGLAN) injection  5 mg Intravenous Q6H  . pantoprazole  40 mg Oral Daily  . piperacillin-tazobactam (ZOSYN)  IV  3.375 g Intravenous Q12H  . pravastatin  20 mg Oral  QPM  . senna  1 tablet Oral BID  . sevelamer carbonate  1,600 mg Oral TID WC  . sodium chloride flush  3 mL Intravenous Q12H  . ascorbic acid  500 mg Oral BID   [facility]phrologist Dr. Arrie Aranoladonato EDW 35kg Heparin 1500 units Access lt BCF  Problem/Plan: 1. Right Foot Gangrenne- s/p right AKA and doing well. on Vanciomycibn and Zosyn IV 1. Recommend stopping abx  2. [facility] , K =4.5 And Volume stable, will need to re-evaluate edw as she is 2.5 kg below edw today following amputation. Daughter raised the issue of stopping dialysis, however Ms. Regula stated that she is not ready to stop so will continue with HD as long as she tolerates it.  1. Off schedule but will get back on schedule next week. 2. Plan for HD tomorrow since last HD 01/01/16 and [facility] 3. N/V- refusing pills. On IV reglan and prn zofran.  Would  stop abx and follow 4. HTN/volume - stable for now. No evidence of volume overload.  5. Anemia - HGB 7.9 / esa on hd (check op records) 6. Secondary hyperparathyroidism - Renvela as Binder 7. Stage IV sacral ulcer- wound care consult 8. Protein malnutrition- continue with supplements 9. Disposition- she will need PT/OT and follow decubitus ulcer.  Will likely need Rehab and hopefully she will be strong enough for CIR.  Irena Cords, MD [facility], [facility] Pager [phone] 01/03/2016, 8:33 AM

## 2016-01-03 NOTE — Progress Notes (Signed)
  Progress Note    01/03/2016 11:07 AM 3 Days Post-Op  Subjective:  Pain improved today   Vitals:   01/02/16 2000 01/03/16 0430  BP: (!) 86/40 (!) 100/58  Pulse: 94 95  Resp: 18 16  Temp: 98 F (36.7 C) 97.4 F (36.3 C)    Physical Exam: R AKA site healing with staples  CBC    Component Value Date/Time   WBC 19.1 (H) 01/01/2016 0724   RBC 2.91 (L) 01/01/2016 0724   HGB 7.7 (L) 01/01/2016 0724   HCT 23.8 (L) 01/01/2016 0724   PLT 353 01/01/2016 0724   MCV 81.8 01/01/2016 0724   MCH 26.5 01/01/2016 0724   MCHC 32.4 01/01/2016 0724   RDW 17.1 (H) 01/01/2016 0724   LYMPHSABS 0.5 (L) 12/30/2015 1616   MONOABS 1.1 (H) 12/30/2015 1616   EOSABS 0.1 12/30/2015 1616   BASOSABS 0.0 12/30/2015 1616    BMET    Component Value Date/Time   NA 132 (L) 01/01/2016 0723   NA 135 (A) 07/10/2015   K 6.0 (H) 01/01/2016 0723   CL 96 (L) 01/01/2016 0723   CO2 22 01/01/2016 0723   GLUCOSE 136 (H) 01/01/2016 0723   BUN 47 (H) 01/01/2016 0723   BUN 18 07/10/2015   CREATININE 5.57 (H) 01/01/2016 0723   CALCIUM 8.4 (L) 01/01/2016 0723   GFRNONAA 7 (L) 01/01/2016 0723   GFRAA 8 (L) 01/01/2016 0723    INR    Component Value Date/Time   INR 1.21 12/08/2015 2302     Intake/Output Summary (Last 24 hours) at 01/03/16 1107 Last data filed at 01/02/16 1900  Gross per 24 hour  Intake              220 ml  Output                0 ml  Net              220 ml     Assessment:  70 y.o. female is s/p right aka  Plan: Progressing well from amputation  Ok for snf when medically stable   Kindel Rochefort C. Randie Heinz, MD [facility] Office: [phone] Pager: [phone]  01/03/2016 11:07 AM

## 2016-01-04 LAB — RENAL FUNCTION PANEL
ALBUMIN: 1.9 g/dL — AB (ref 3.5–5.0)
ANION GAP: 11 (ref 5–15)
BUN: 17 mg/dL (ref 6–20)
CALCIUM: 8.2 mg/dL — AB (ref 8.9–10.3)
CO2: 28 mmol/L (ref 22–32)
Chloride: 99 mmol/L — ABNORMAL LOW (ref 101–111)
Creatinine, Ser: 2.53 mg/dL — ABNORMAL HIGH (ref 0.44–1.00)
GFR calc Af Amer: 21 mL/min — ABNORMAL LOW (ref >60)
GFR calc non Af Amer: 18 mL/min — ABNORMAL LOW (ref >60)
GLUCOSE: 90 mg/dL (ref 65–99)
PHOSPHORUS: 2.9 mg/dL (ref 2.5–4.6)
Potassium: 3.3 mmol/L — ABNORMAL LOW (ref 3.5–5.1)
SODIUM: 138 mmol/L (ref 135–145)

## 2016-01-04 LAB — CBC
HCT: 21.6 % — ABNORMAL LOW (ref 36.0–46.0)
HEMOGLOBIN: 7.1 g/dL — AB (ref 12.0–15.0)
MCH: 27.2 pg (ref 26.0–34.0)
MCHC: 32.9 g/dL (ref 30.0–36.0)
MCV: 82.8 fL (ref 78.0–100.0)
Platelets: 231 10*3/uL (ref 150–400)
RBC: 2.61 MIL/uL — ABNORMAL LOW (ref 3.87–5.11)
RDW: 17.3 % — ABNORMAL HIGH (ref 11.5–15.5)
WBC: 11.2 10*3/uL — AB (ref 4.0–10.5)

## 2016-01-04 LAB — GLUCOSE, CAPILLARY
GLUCOSE-CAPILLARY: 180 mg/dL — AB (ref 65–99)
Glucose-Capillary: 119 mg/dL — ABNORMAL HIGH (ref 65–99)
Glucose-Capillary: 197 mg/dL — ABNORMAL HIGH (ref 65–99)

## 2016-01-04 MED ORDER — OXYCODONE-ACETAMINOPHEN 5-325 MG PO TABS: 1.0000 | ORAL_TABLET | Freq: Three times a day (TID) | ORAL | 0 refills | Status: AC | PRN

## 2016-01-04 MED ORDER — BOOST / RESOURCE BREEZE PO LIQD: 1.0000 | Freq: Three times a day (TID) | ORAL | 0 refills | Status: DC

## 2016-01-04 MED ORDER — GABAPENTIN 100 MG PO CAPS: 100.0000 mg | ORAL_CAPSULE | Freq: Two times a day (BID) | ORAL | Status: DC

## 2016-01-04 MED ORDER — METOCLOPRAMIDE HCL 5 MG PO TABS: 5.0000 mg | ORAL_TABLET | Freq: Three times a day (TID) | ORAL | Status: DC

## 2016-01-04 NOTE — Progress Notes (Signed)
SLP Cancellation Note  Patient Details Name: Yenty Cockran MRN: [id number] DOB: [birth year]   Cancelled treatment:       Reason Eval/Treat Not Completed: Patient at procedure or test/unavailable. In HD.  Ferdinand Lango MA, CCC-SLP [phone]    Teodoro Jeffreys Meryl 01/04/2016, 9:59 AM

## 2016-01-04 NOTE — Anesthesia Postprocedure Evaluation (Signed)
Anesthesia Post Note  Patient: Kalaysha Syme  Procedure(s) Performed: Procedure(s) (LRB): AMPUTATION ABOVE KNEE RIGHT (Right)  Patient location during evaluation: PACU Anesthesia Type: General Level of consciousness: awake and alert Pain management: pain level controlled Vital Signs Assessment: post-procedure vital signs reviewed and stable Respiratory status: spontaneous breathing, nonlabored ventilation, respiratory function stable and patient connected to nasal cannula oxygen Cardiovascular status: blood pressure returned to baseline and stable Postop Assessment: no signs of nausea or vomiting Anesthetic complications: no    Last Vitals:  Vitals:   01/03/16 2030 01/04/16 0447  BP: (!) 99/54 (!) 91/52  Pulse: (!) 101 95  Resp: 16 16  Temp: 36.7 C 36.4 C    Last Pain:  Vitals:   01/04/16 0447  TempSrc: Oral  PainSc:                  Shelton Silvas

## 2016-01-04 NOTE — Procedures (Signed)
Stable on HD, no c/o's.  Withdrawn as is baseline.  No new suggestions.    I was present at this dialysis session, have reviewed the session itself and made  appropriate changes Rob Daxx Tiggs MD [facility] pager [phone]   01/04/2016, 12:35 PM    

## 2016-01-04 NOTE — Procedures (Deleted)
Stable on HD, no c/o's.  Withdrawn as is baseline.  No new suggestions.    I was present at this dialysis session, have reviewed the session itself and made  appropriate changes Vinson Moselle MD [facility] pager [phone]   01/04/2016, 12:35 PM

## 2016-01-04 NOTE — Care Management Note (Signed)
Case Management Note Donn Pierini RN, BSN Unit 2W-Case Manager [phone]  Patient Details  Name: Elizabeth [name] MRN: [id number] Date of Birth: 27-Nov-1945  Subjective/Objective:   Pt admitted with gangrene- s/p AKA                 Action/Plan: PTA pt was at [facility]- CSW following for return to SNF  Expected Discharge Date:     01/04/16             Expected Discharge Plan:  Skilled Nursing Facility  In-House Referral:  Clinical Social Work  Discharge planning Services  CM Consult  Post Acute Care Choice:    Choice offered to:     DME Arranged:    DME Agency:     HH Arranged:    HH Agency:     Status of Service:  Completed, signed off  If discussed at [employer], dates discussed:    Additional Comments:  01/04/16- 1400- Donn Pierini RN CM- pt to return to [facility] SN[facility] today- CSW following.   Darrold Span, RN 01/04/2016, 2:06 PM

## 2016-01-04 NOTE — Clinical Social Work Note (Signed)
Clinical Social Work Assessment  Patient Details  Name: Elizabeth [name] MRN: [id number] Date of Birth: [birth year]  Date of referral:  01/04/16               Reason for consult:  Facility Placement                Permission sought to share information with:  Family Supports Permission granted to share information::  Yes, Verbal Permission Granted  Name::     Jodell Cipro  Relationship::  granddaughter  Contact Information:  [phone]  Housing/Transportation Living arrangements for the past 2 months:  Clint of Information:  Patient, Adult Children Patient Interpreter Needed:  None Criminal Activity/Legal Involvement Pertinent to Current Situation/Hospitalization:  No - Comment as needed Significant Relationships:  Adult Children, Other Family Members Lives with:  Facility Resident Do you feel safe going back to the place where you live?  Yes Need for family participation in patient care:  No (Coment)  Care giving concerns:  No care giving concerns identified.   Social Worker assessment / plan:  CSW met with pt to address consult as pt as admitted from [facility]. CSW introduced herself and explained role of social work. Pt is a LTC of [facility] and is able to return. Pt is agreeable to returning. CSW spoke to pt's granddaughter, who is POA, and she is agreeable to pt returning. Pt does not have a guardian. CSW is signing off as no further needs identified.   Employment status:  Retired Forensic scientist:  Information systems manager, Medicaid [place] PT Recommendations:  Not assessed at this time Information / Referral to community resources:  [place]  Patient/Family's Response to care:  Pt and granddaughter were appreciative of CSW support.   Patient/Family's Understanding of and Emotional Response to Diagnosis, Current Treatment, and Prognosis:  Pt understands that is returning to facility for continued to care.   Emotional  Assessment Appearance:  Appears stated age Attitude/Demeanor/Rapport:  Other (Appropriate) Affect (typically observed):  Accepting, Adaptable, Pleasant Orientation:  Oriented to Self, Oriented to Place, Oriented to  Time, Oriented to Situation Alcohol / Substance use:  Not Applicable Psych involvement (Current and /or in the community):  No (Comment)  Discharge Needs  Concerns to be addressed:  Adjustment to Illness Readmission within the last 30 days:  No Current discharge risk:  Chronically ill Barriers to Discharge:  No Barriers Identified   Darden Dates, LCSW 01/04/2016, 2:56 PM

## 2016-01-04 NOTE — Progress Notes (Signed)
PT Cancellation Note  Patient Details Name: Elizabeth [name] MRN: [id number] DOB: 01-18-1946   Cancelled Treatment:    Reason Eval/Treat Not Completed: Patient at procedure or test/unavailable (pt in HD and unavailable)   [name] 01/04/2016, 8:40 AM  [name], PT [phone]

## 2016-01-04 NOTE — Progress Notes (Signed)
Per MD, patient discharging to SNF and does not need eval.   Ferdinand Lango MA, CCC-SLP [phone]

## 2016-01-04 NOTE — Discharge Summary (Signed)
Physician Discharge Summary   Patient ID: Elizabeth [name] MRN: [id number] DOB/AGE: [birth year] 70 y.o.  Admit date: 12/30/2015 Discharge date: 01/04/2016  Primary Care Physician:  Kirt Boys, DO  Discharge Diagnoses:    . 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 Anemia of chronic disease . Cardiomyopathy, ischemic . Chronic diastolic congestive heart failure (HCC) . DM (diabetes mellitus) type II controlled with renal manifestation [facility])   Consults:   Vascular surgery Nephrology   Recommendations for Outpatient Follow-up:  1. Please repeat CBC/BMET at next visit   DIET: Carb modified diet    Allergies:  No Known Allergies   DISCHARGE MEDICATIONS: Current Discharge Medication List    START taking these medications   Details  feeding supplement (BOOST / RESOURCE BREEZE) LIQD Take 1 Container by mouth 3 (three) times daily between meals. Refills: 0    gabapentin (NEURONTIN) 100 MG capsule Take 1 capsule (100 mg total) by mouth 2 (two) times daily.    metoCLOPramide (REGLAN) 5 MG tablet Take 1 tablet (5 mg total) by mouth 3 (three) times daily before meals.      CONTINUE these medications which have CHANGED   Details  oxyCODONE-acetaminophen (PERCOCET/ROXICET) 5-325 MG tablet Take 1 tablet by mouth every 8 (eight) hours as needed for severe pain. Qty: 15 tablet, Refills: 0      CONTINUE these medications which have NOT CHANGED   Details  insulin aspart (NOVOLOG) 100 UNIT/ML injection 200-250 give 2 units, 251-300 give 4 units, 301-350 give 6 units, 351-400 give 8 units, More than 400-call M.D.    ascorbic acid (VITAMIN C) 500 MG tablet Take 500 mg by mouth 2 (two) times daily.    aspirin EC 81 MG tablet Take 81 mg by mouth daily.    cetirizine (ZYRTEC) 10 MG tablet Take 10 mg by mouth daily as needed for allergies.     fluticasone (FLONASE) 50 MCG/ACT nasal spray Place 1 spray into both nostrils 2 (two) times daily.    lidocaine-prilocaine  (EMLA) cream Apply 1 application topically Every Tuesday,Thursday,and Saturday with dialysis.    Associated Diagnoses: Type II diabetes mellitus, uncontrolled (HCC)    pantoprazole (PROTONIX) 40 MG tablet Take 1 tablet (40 mg total) by mouth daily.    pravastatin (PRAVACHOL) 20 MG tablet Take 20 mg by mouth every evening.     promethazine (PHENERGAN) 25 MG tablet Take 25 mg by mouth every 6 (six) hours as needed for nausea or vomiting.    senna (SENOKOT) 8.6 MG tablet Take 1 tablet by mouth 2 (two) times daily.    sevelamer carbonate (RENVELA) 800 MG tablet Take 1,600 mg by mouth 3 (three) times daily with meals.         Brief H and P: For complete details please refer to admission H and P, but in brief BettyComithieris a 70 y.o.female,with medical history significant of ESRD on Tu/Th/Sat HD, HLD, h/o CVA, chronic indwelling foley, DM, PVD s/p amputation , patient was sent from SNF facility for increase in discoloration and necrosis of her toes on the right foot, progressive symptoms over one week, patient denies any fever or chills, but reports it was weakness and poor appetite, patient with history of gangrene and left lower extremity status post amputation by Dr. early in March 2016. - in ED workup significant for leukocytosis of 19.9, anemia with hemoglobin of 8.8, creatinine of 4.16, lactic acid within normal limits at 1.3, foot x-ray significant of gas gangrene and right foot, patient started on IV  vancomycin and Zosyn.  Hospital Course:  Gas gangrene - postop day #4, Status post right AKA - Patient presented with foul smelling right foot, and gangrene,  with evidence of gas gangrene -Patient was placed on IV vancomycin and Zosyn. Vascular surgery was consulted, patient was seen by Dr. Arbie Cookey.  - Patient underwent right AKA on 11/9  - 48 hours after the surgery antibiotics were all discontinued. She did complain of phantom pain in the right lower extremity which improved after  starting Neurontin.  - Stable from vascular surgery standpoint to DC to snf  Nausea - Possibly from antibiotics or gastroparesis, Placed on Reglan, now improving.   End-stage renal disease - On hemodialysis TTS,noncompliant, missed hemodialysis 11/4 prior to admission - Patient now receiving hemodialysis prior to discharge.  Diabetes mellitus - Continue with insulin sliding scale, not on Lantus anymore  Dyslipidemia - Continue statin  CAD - Most recent cath in February 2016 with evidence of three-vessel disease, - denies any chest pain or shortness of breath, continue with aspirin  Ischemic cardiac erythema with chronic systolic/diastolic CHF - Volume management with HD  PAD - Status post left BKA on aspirin daily  Urinary retention - She has long-term Foley  Stage IV sacral ulcer  Anemia - Anemia of end-stage renal disease and chronic illness, baseline is around 9    Day of Discharge BP 101/60   Pulse 88   Temp 97.3 F (36.3 C) (Oral)   Resp (!) 9   Ht 5\' 3"  (1.6 m)   Wt 32.3 kg (71 lb 3.3 oz)   SpO2 96%   BMI 12.61 kg/m   Physical Exam: General: Alert and awake oriented x3 not in any acute distress. HEENT: anicteric sclera, pupils reactive to light and accommodation CVS: S1-S2 clear no murmur rubs or gallops Chest: clear to auscultation bilaterally, no wheezing rales or rhonchi Abdomen: soft nontender, nondistended, normal bowel sounds Extremities: no cyanosis, clubbing Left BKA. Rt AKA    The results of significant diagnostics from this hospitalization (including imaging, microbiology, ancillary and laboratory) are listed below for reference.    LAB RESULTS: Basic Metabolic Panel:  Recent Labs Lab 01/01/16 0723 01/04/16 0811  NA 132* 138  K 6.0* 3.3*  CL 96* 99*  CO2 22 28  GLUCOSE 136* 90  BUN 47* 17  CREATININE 5.57* 2.53*  CALCIUM 8.4* 8.2*  PHOS 7.4* 2.9   Liver Function Tests:  Recent Labs Lab 01/01/16 0723  01/04/16 0811  ALBUMIN 2.2* 1.9*   No results for input(s): LIPASE, AMYLASE in the last 168 hours. No results for input(s): AMMONIA in the last 168 hours. CBC:  Recent Labs Lab 12/30/15 1616  01/01/16 0724 01/04/16 0811  WBC 19.9*  < > 19.1* 11.2*  NEUTROABS 18.2*  --   --   --   HGB 8.8*  < > 7.7* 7.1*  HCT 26.9*  < > 23.8* 21.6*  MCV 82.5  < > 81.8 82.8  PLT 316  < > 353 231  < > = values in this interval not displayed. Cardiac Enzymes: No results for input(s): CKTOTAL, CKMB, CKMBINDEX, TROPONINI in the last 168 hours. BNP: Invalid input(s): POCBNP CBG:  Recent Labs Lab 01/04/16 0019 01/04/16 0438  GLUCAP 197* 180*    Significant Diagnostic Studies:  Dg Foot Complete Right  Result Date: 12/30/2015 CLINICAL DATA:  Necrotic toes of the right foot. EXAM: RIGHT FOOT COMPLETE - 3+ VIEW COMPARISON:  None. FINDINGS: There is extensive arterial vascular calcification in the  foot. There is gas in the soft tissues throughout the plantar aspect of the foot, most prominent in the forefoot but extending under the plantar aspect of the calcaneus. No discrete bone destruction or fractures. 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 Ankle joint appears normal. 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 No radiographic evidence of osteomyelitis. Extensive arterial vascular calcifications in the foot and ankle. Electronically Signed   By: Francene BoyersJames  Maxwell M.D.   On: 12/30/2015 16:37    2D ECHO:   Disposition and Follow-up: Discharge Instructions    Diet Carb Modified    Complete by:  As directed    Increase activity slowly    Complete by:  As directed        DISPOSITION: Skilled nursing facility   DISCHARGE FOLLOW-UP Follow-up Information    Kirt BoysCarter, Monica, DO. Schedule an appointment as soon as possible for a visit in 2 week(s).   Specialty:  Internal Medicine Contact information: [address]  [place] [id number] [phone]         Leonides SakeBrian Chen, MD Follow up on 02/05/2016.   Specialties:  Vascular Surgery, Cardiology Why:  @10 :00 am for staple removal Contact information: [address] [place] [address] [phone]            Time spent on Discharge: 35 mins   Signed:   Keena Dinse M.D. [facility] 01/04/2016, 12:35 PM Pager: [phone]

## 2016-01-05 ENCOUNTER — Encounter: Payer: Self-pay | Admitting: Internal Medicine

## 2016-01-05 ENCOUNTER — Non-Acute Institutional Stay (SKILLED_NURSING_FACILITY): Payer: Medicare Other | Admitting: Internal Medicine

## 2016-01-05 DIAGNOSIS — I739 Peripheral vascular disease, unspecified: Secondary | ICD-10-CM | POA: Diagnosis not present

## 2016-01-05 DIAGNOSIS — R5381 Other malaise: Secondary | ICD-10-CM

## 2016-01-05 DIAGNOSIS — Z794 Long term (current) use of insulin: Secondary | ICD-10-CM | POA: Diagnosis not present

## 2016-01-05 DIAGNOSIS — D638 Anemia in other chronic diseases classified elsewhere: Secondary | ICD-10-CM

## 2016-01-05 DIAGNOSIS — L89154 Pressure ulcer of sacral region, stage 4: Secondary | ICD-10-CM | POA: Diagnosis not present

## 2016-01-05 DIAGNOSIS — K220 Achalasia of cardia: Secondary | ICD-10-CM | POA: Diagnosis not present

## 2016-01-05 DIAGNOSIS — Z992 Dependence on renal dialysis: Secondary | ICD-10-CM

## 2016-01-05 DIAGNOSIS — E43 Unspecified severe protein-calorie malnutrition: Secondary | ICD-10-CM | POA: Diagnosis not present

## 2016-01-05 DIAGNOSIS — L98429 Non-pressure chronic ulcer of back with unspecified severity: Secondary | ICD-10-CM

## 2016-01-05 DIAGNOSIS — N186 End stage renal disease: Secondary | ICD-10-CM | POA: Diagnosis not present

## 2016-01-05 DIAGNOSIS — I5032 Chronic diastolic (congestive) heart failure: Secondary | ICD-10-CM | POA: Diagnosis not present

## 2016-01-05 DIAGNOSIS — E1122 Type 2 diabetes mellitus with diabetic chronic kidney disease: Secondary | ICD-10-CM

## 2016-01-05 DIAGNOSIS — Z89611 Acquired absence of right leg above knee: Secondary | ICD-10-CM | POA: Diagnosis not present

## 2016-01-05 NOTE — Progress Notes (Signed)
Patient ID: Elizabeth [name], female   DOB: Mar 22, 1945, 70 y.o.   MRN: [id number]    HISTORY AND PHYSICAL   DATE: 01/05/2016  Location:    [place] Room Number: 103 A Place of Service: SNF (31)   Extended Emergency Contact Information Primary Emergency Contact: Gunnar Bulla, [place] Mobile Phone: [phone] Relation: Grandaughter Secondary Emergency Contact: Delphina Cahill [place] Mobile Phone: [phone] Relation: Niece  Advanced Directive information Does patient have an advance directive?: Yes, Type of Advance Directive: Out of facility DNR (pink MOST or yellow form), Does patient want to make changes to advanced directive?: No - Patient declined  Chief Complaint  Patient presents with  . Readmit To SNF    HPI:  70 yo female long term resident seen today as a readmission into SNF following hospital stay for RLE gas gangrene s/p right AKA, AOCD, ESRD/HD, ischemic CM, chronic diastolic CHF, DM. She was seen by vascular sx and taken to OR for right AKA on 11/9th. She rec'd IV vanco/zosyn x 48 hrs postop. She was started on neurontin to help phantom limb pain. Hgb 7.1 at d/c; WBC 11.2K (down from 20K); albumin 1.9; K 3.3 at d/c. She presents to SNF for continued long term care.  She c/o nausea today after breakfast. No emesis. Pain is controlled on percocet. No f/c. She states she had HD yesterday prior to d/c. Next scheduled tx on Thurs 11/16th. No other concerns. No nursing issues. No falls. 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 She is noncompliant with HD. She missed her tx on 11/4th. Is on 1200 cc fluid restriction; renvela 1600 mg three times daily  DM - A1c 8.8%. Gets Novolog SSI qAC. No longer on lantus. CBG 115 today. Suspect BS will improve now that infection in RLE resolved.  Hx achalasia of esophagus status post Heller/Toupet on 03-06-15 - stable on  protonix 40 mg daily. She gets prn phenergan  Dyslipidemia - LDL 90. Takes pravachol 20 mg daily  Anemia of chronic disease - stable. Hgb 9.6  CAD - she has 3 vessel disease and is s/p cath on 03-24-14: - no complaint of chest pain present; takes ASA 81 mg daily   Chronic systolic heart failure - EF is 30%-35%; without symptoms of fluid overload present. mx at HD  PAD - s/p left BKA; right AKA. 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 Acute hyperkalemia 12/24/2013  . Anemia   . Arthritis of knee, right    "right knee" (06/12/2014)  . Chronic indwelling Foley catheter    "it's been in since ~ 2014" (12/30/2015)  . Coronary artery disease, non-occlusive January 2016   Moderate p&mLAD & Ostial OM; Myoview Nuclear Stress Test 03/21/2014: EF 35%. Mild hypokinesis of the inferior wall with fixed inferior defect suggesting scar. (no culprit lesion on CATH)  . Critical lower limb ischemia    Status post left BKA following left popliteal and peroneal artery endarterectomy with patch  angioplasty.  . Diabetes mellitus, insulin dependent (IDDM), uncontrolled ([place])    Brittle; has h/o Hypo & Hyperglycemic episodes, Type II  . Diabetic neuropathy ([place])   . ESRD (end stage renal disease) on dialysis [facility])    [place], [place], [place] (12/30/2015)  . Foley catheter in place    for urinary retention  . Foot infection   . Gout    "was in my LLE"  . Hematemesis April 2016   Hospitalized  . History of CVA (cerebrovascular accident) 08/19/2013   "minor one"  . Hyperlipidemia with target LDL less than 70   . Loss of consciousness ([place]) 08/19/2013  . Peripheral vascular  disease of lower extremity with ulceration ([place]) January-March 2016   PV Angiogram 03/19/2014: Bilateral AP and PT artery occlusions, 90% Left peroneal artery with single-vessel Right peroneal and flow  . Urinary retention    DX NEUROGENIC BLADDER--HAS INDWELLING FOLEY CATHETER  . UTI (urinary tract infection) 10/17/2013    Past Surgical History:  Procedure Laterality Date  . ABDOMINAL AORTAGRAM N/A 03/19/2014   Procedure: ABDOMINAL Maxcine Ham;  Surgeon: Elam Dutch, MD;  Location: [facility] CATH LAB;  Service: Cardiovascular;  Laterality: N/A;  . AMPUTATION Left 03/26/2014   Procedure: AMPUTATION SECOND LEFT TOE;  Surgeon: Elam Dutch, MD;  Location: [place];  Service: Vascular;  Laterality: Left;  . AMPUTATION Left 05/10/2014   Procedure: AMPUTATION BELOW KNEE;  Surgeon: Rosetta Posner, MD;  Location: [place];  Service: Vascular;  Laterality: Left;  . AMPUTATION Right 12/31/2015   Procedure: AMPUTATION ABOVE KNEE RIGHT;  Surgeon: Conrad Milo, MD;  Location: [place];  Service: Vascular;  Laterality: Right;  . ANGIOPLASTY Left 04/29/2013   Procedure: ANGIOPLASTY;  Surgeon: Angelia Mould, MD;  Location: [facility] CATH LAB;  Service: Cardiovascular;  Laterality: Left;  . APPLICATION OF A-CELL OF EXTREMITY N/A 10/29/2014   Procedure: PLACEMENT OF APPLICATION OF A-CELL;  Surgeon: Theodoro Kos, DO;  Location: [place];  Service: Plastics;  Laterality: N/A;  . AV FISTULA PLACEMENT Left 04/28/2014   Procedure: CREATION OF LEFT BRACHIOCEPHALIC  ARTERIOVENOUS (AV) FISTULA CREATION;  Surgeon: Conrad [place], MD;  Location: [place];  Service: Vascular;  Laterality: Left;  . BASCILIC VEIN TRANSPOSITION Left 10/30/2012   Procedure: LEFT 1ST STAGE BASCILIC VEIN TRANSPOSITION;  Surgeon: Angelia Mould, MD;  Location: [place];  Service: Vascular;  Laterality: Left;  . BASCILIC VEIN TRANSPOSITION Left 02/05/2013   Procedure: BASCILIC VEIN TRANSPOSITION 2ND STAGE;  Surgeon: Angelia Mould, MD;  Location: [place];   Service: Vascular;  Laterality: Left;  . BELOW KNEE LEG AMPUTATION Left   . ENDARTERECTOMY POPLITEAL Left 03/26/2014   Procedure: Left popliteal and peroneal artery endarterectomy with vein patch angioplasty;  Surgeon: Elam Dutch, MD;  Location: [place];  Service: Vascular;  Laterality: Left;  . ESOPHAGEAL MANOMETRY N/A 11/24/2014   Procedure: ESOPHAGEAL MANOMETRY (EM);  Surgeon: Carol Ada, MD;  Location: [facility] ENDOSCOPY;  Service: Endoscopy;  Laterality: N/A;  . ESOPHAGOGASTRODUODENOSCOPY N/A 06/13/2014   Procedure: ESOPHAGOGASTRODUODENOSCOPY (EGD);  Surgeon: Carol Ada, MD;  Location: [facility] ENDOSCOPY;  Service: Endoscopy;  Laterality: N/A;  . FISTULOGRAM Left 04/29/2013   Procedure: FISTULOGRAM;  Surgeon: Angelia Mould, MD;  Location: [facility] CATH LAB;  Service: Cardiovascular;  Laterality: Left;  . FISTULOGRAM Left 10/08/2014   Procedure: LEFT ARM FISTULOGRAM;  Surgeon: Conrad Luyando, MD;  Location: [place];  Service: Vascular;  Laterality: Left;  . INCISION AND DRAINAGE OF WOUND N/A  10/29/2014   Procedure: IRRIGATION AND DEBRIDEMENT OF SACRAL WOUND, PLACEMENT OF A-CELL;  Surgeon: Theodoro Kos, DO;  Location: [place];  Service: Plastics;  Laterality: N/A;  . INSERTION OF SUPRAPUBIC CATHETER N/A 05/16/2012   Procedure: INSERTION OF SUPRAPUBIC CATHETER;  Surgeon: Alexis Frock, MD;  Location: [facility];  Service: Urology;  Laterality: N/A;  . LEFT AND RIGHT HEART CATHETERIZATION WITH CORONARY ANGIOGRAM N/A 03/24/2014   Procedure: LEFT AND RIGHT HEART CATHETERIZATION WITH CORONARY ANGIOGRAM;  Surgeon: Leonie Man, MD;  Location: [facility] CATH LAB;  Service: Cardiovascular; Moderate disease in p-mLAD & Ostial OM1; normal right heart pressures. Wedge pressure 29 mmHg. --No lesion to explain inferior defect on Myoview and echocardiogram.   . NM MYOCAR PERF WALL MOTION  04/19/2012/ 02/2014   a) low risk study-EF 44%,mild inferolateral hypkinesis; b) EF 35%. Mild hypokinesis of the inferior wall with fixed inferior  defect suggesting scar.  Marland Kitchen RADIOLOGY WITH ANESTHESIA N/A 10/25/2013   Procedure: RADIOLOGY WITH ANESTHESIA;  Surgeon: Medication Radiologist, MD;  Location: [place];  Service: Radiology;  Laterality: N/A;  . TRANSTHORACIC ECHOCARDIOGRAM  03/22/2014   EF 30-35%, mild LVH. Severe inferior hypokinesis. Mild MR.  Marland Kitchen UPPER GI ENDOSCOPY  03/06/2015   Procedure: UPPER GI ENDOSCOPY;  Surgeon: Michael Boston, MD;  Location: [facility];  Service: General;;  . VENOPLASTY Left 10/08/2014   Procedure: LEFT CEPHALIC VEIN VENOPLASTY;  Surgeon: Conrad [employer], MD;  Location: [place];  Service: Vascular;  Laterality: Left;    Patient Care Team: Gildardo Cranker, DO as PCP - General (Internal Medicine) Fleet Contras, MD as Consulting Physician (Nephrology) Pixie Casino, MD as Consulting Physician (Cardiology) Carol Ada, MD as Consulting Physician (Gastroenterology) Michael Boston, MD as Consulting Physician (General Surgery) Conrad Rockwood, MD as Consulting Physician (Vascular Surgery) Gerlene Fee, NP as Nurse Practitioner (Geriatric Medicine) [facility]  Social History   Social History  . Marital status: Single    Spouse name: N/A  . Number of children: N/A  . Years of education: N/A   Occupational History  . disabled    Social History Main Topics  . Smoking status: Never Smoker  . Smokeless tobacco: Never Used  . Alcohol use No     Comment: "stopped drinking in 2013 drank a couple beers/day, 2 days/wk"  . Drug use: No  . Sexual activity: No   Other Topics Concern  . Not on file   Social History Narrative   Granddaughter is contact      Resident of [place]      reports that she has never smoked. She has never used smokeless tobacco. She reports that she does not drink alcohol or use drugs.  Family History  Problem Relation Age of Onset  . Hyperlipidemia Mother   . Hypertension Mother   . Hodgkin's lymphoma Father   . Heart disease  Sister    Family Status  Relation Status  . Mother Deceased at age 17  . Father Deceased at age 34  . Sister Deceased  . Child Alive  . Child Alive    Immunization History  Administered Date(s) Administered  . Influenza-Unspecified 11/21/2012, 12/22/2013, 11/22/2014  . PPD Test 12/14/2015  . Pneumococcal Polysaccharide-23 10/21/2013    No Known Allergies  Medications: Patient's Medications  New Prescriptions   No medications on file  Previous Medications   ASCORBIC ACID (VITAMIN C) 500 MG TABLET    Take 500 mg by mouth 2 (two) times  daily.   ASPIRIN EC 81 MG TABLET    Take 81 mg by mouth daily.   CETIRIZINE (ZYRTEC) 10 MG TABLET    Take 10 mg by mouth daily as needed for allergies.    FLUTICASONE (FLONASE) 50 MCG/ACT NASAL SPRAY    Place 1 spray into both nostrils 2 (two) times daily.   GABAPENTIN (NEURONTIN) 100 MG CAPSULE    Take 1 capsule (100 mg total) by mouth 2 (two) times daily.   INSULIN ASPART (NOVOLOG) 100 UNIT/ML INJECTION    200-250 give 2 units, 251-300 give 4 units, 301-350 give 6 units, 351-400 give 8 units, More than 400-call M.D.   LIDOCAINE-PRILOCAINE (EMLA) CREAM    Apply 1 application topically Every Tuesday,Thursday,and Saturday with dialysis.    METOCLOPRAMIDE (REGLAN) 5 MG TABLET    Take 1 tablet (5 mg total) by mouth 3 (three) times daily before meals.   OXYCODONE-ACETAMINOPHEN (PERCOCET/ROXICET) 5-325 MG TABLET    Take 1 tablet by mouth every 8 (eight) hours as needed for severe pain.   PANTOPRAZOLE (PROTONIX) 40 MG TABLET    Take 1 tablet (40 mg total) by mouth daily.   PRAVASTATIN (PRAVACHOL) 20 MG TABLET    Take 20 mg by mouth every evening.    PROMETHAZINE (PHENERGAN) 25 MG TABLET    Take 25 mg by mouth every 6 (six) hours as needed for nausea or vomiting.   SENNA (SENOKOT) 8.6 MG TABLET    Take 1 tablet by mouth 2 (two) times daily.   SEVELAMER CARBONATE (RENVELA) 800 MG TABLET    Take 1,600 mg by mouth 3 (three) times daily with meals.    Modified Medications   No medications on file  Discontinued Medications   FEEDING SUPPLEMENT (BOOST / RESOURCE BREEZE) LIQD    Take 1 Container by mouth 3 (three) times daily between meals.    Review of Systems  Gastrointestinal: Positive for nausea.  Musculoskeletal: Positive for arthralgias and gait problem.  All other systems reviewed and are negative.   Vitals:   01/05/16 0846  BP: 100/64  Pulse: 100  Resp: 20  Temp: 98.8 F (37.1 C)  TempSrc: Oral  SpO2: 99%  Weight: 71 lb 3.3 oz (32.3 kg)  Height: _0  (1.549 m)   Body mass index is 13.45 kg/m.  Physical Exam  Constitutional: She is oriented to person, place, and time. She appears well-developed.  Frail appearing in NAD, lying in bed  HENT:  Mouth/Throat: Oropharynx is clear and moist. No oropharyngeal exudate.  MMM; no oral thrush  Eyes: Pupils are equal, round, and reactive to light. No scleral icterus.  Neck: Neck supple. Carotid bruit is not present. No tracheal deviation present. No thyromegaly present.  Cardiovascular: Normal rate, regular rhythm and intact distal pulses.  Exam reveals no gallop and no friction rub.   Murmur (1/6 SEM) heard. Pulses:      Right popliteal pulse not accessible.       Right dorsalis pedis pulse not accessible and left dorsalis pedis pulse not accessible.       Right posterior tibial pulse not accessible and left posterior tibial pulse not accessible.  Right AKA. Left BKA. Left arm AVG with palpable thrill/audible bruit  Pulmonary/Chest: Effort normal and breath sounds normal. No stridor. No respiratory distress. She has no wheezes. She has no rales.  Abdominal: Soft. Bowel sounds are normal. She exhibits no distension and no mass. There is no hepatomegaly. There is tenderness. There is no rebound and no guarding.  Musculoskeletal:  Left BKA; right AKA staples intact with no bleeding, d/c or wound dehiscence. No swelling or redness  Lymphadenopathy:    She has no cervical  adenopathy.  Neurological: She is alert and oriented to person, place, and time.  Skin: Skin is warm and dry. No rash noted.  Sacral wound care - per wound care.  Psychiatric: She has a normal mood and affect. Her behavior is normal. Judgment and thought content normal.  Nursing note reviewed.    Labs reviewed: Admission on 12/30/2015, Discharged on 01/04/2016  Component Date Value Ref Range Status  . WBC 12/30/2015 19.9* 4.0 - 10.5 K/uL Final  . RBC 12/30/2015 3.26* 3.87 - 5.11 MIL/uL Final  . Hemoglobin 12/30/2015 8.8* 12.0 - 15.0 g/dL Final  . HCT 12/30/2015 26.9* 36.0 - 46.0 % Final  . MCV 12/30/2015 82.5  78.0 - 100.0 fL Final  . MCH 12/30/2015 27.0  26.0 - 34.0 pg Final  . MCHC 12/30/2015 32.7  30.0 - 36.0 g/dL Final  . RDW 12/30/2015 16.7* 11.5 - 15.5 % Final  . Platelets 12/30/2015 316  150 - 400 K/uL Final  . Neutrophils Relative % 12/30/2015 92  % Final  . Neutro Abs 12/30/2015 18.2* 1.7 - 7.7 K/uL Final  . Lymphocytes Relative 12/30/2015 2  % Final  . Lymphs Abs 12/30/2015 0.5* 0.7 - 4.0 K/uL Final  . Monocytes Relative 12/30/2015 6  % Final  . Monocytes Absolute 12/30/2015 1.1* 0.1 - 1.0 K/uL Final  . Eosinophils Relative 12/30/2015 0  % Final  . Eosinophils Absolute 12/30/2015 0.1  0.0 - 0.7 K/uL Final  . Basophils Relative 12/30/2015 0  % Final  . Basophils Absolute 12/30/2015 0.0  0.0 - 0.1 K/uL Final  . Sodium 12/30/2015 132* 135 - 145 mmol/L Final  . Potassium 12/30/2015 4.6  3.5 - 5.1 mmol/L Final  . Chloride 12/30/2015 96* 101 - 111 mmol/L Final  . CO2 12/30/2015 24  22 - 32 mmol/L Final  . Glucose, Bld 12/30/2015 161* 65 - 99 mg/dL Final  . BUN 12/30/2015 39* 6 - 20 mg/dL Final  . Creatinine, Ser 12/30/2015 4.16* 0.44 - 1.00 mg/dL Final  . Calcium 12/30/2015 8.5* 8.9 - 10.3 mg/dL Final  . GFR calc non Af Amer 12/30/2015 10* >60 mL/min Final  . GFR calc Af Amer 12/30/2015 11* >60 mL/min Final   Comment: (NOTE) The eGFR has been calculated using the CKD EPI  equation. This calculation has not been validated in all clinical situations. eGFR's persistently <60 mL/min signify possible Chronic Kidney Disease.   . Anion gap 12/30/2015 12  5 - 15 Final  . WBC 12/30/2015 20.0* 4.0 - 10.5 K/uL Final  . RBC 12/30/2015 3.15* 3.87 - 5.11 MIL/uL Final  . Hemoglobin 12/30/2015 8.4* 12.0 - 15.0 g/dL Final  . HCT 12/30/2015 25.7* 36.0 - 46.0 % Final  . MCV 12/30/2015 81.6  78.0 - 100.0 fL Final  . MCH 12/30/2015 26.7  26.0 - 34.0 pg Final  . MCHC 12/30/2015 32.7  30.0 - 36.0 g/dL Final  . RDW 12/30/2015 16.5* 11.5 - 15.5 % Final  . Platelets 12/30/2015 329  150 - 400 K/uL Final  . Creatinine, Ser 12/30/2015 4.28* 0.44 - 1.00 mg/dL Final  . GFR calc non Af Amer 12/30/2015 10* >60 mL/min Final  . GFR calc Af Amer 12/30/2015 11* >60 mL/min Final   Comment: (NOTE) The eGFR has been calculated using the CKD EPI equation. This calculation has not been validated in all  clinical situations. eGFR's persistently <60 mL/min signify possible Chronic Kidney Disease.   . Sodium 12/31/2015 131* 135 - 145 mmol/L Final  . Potassium 12/31/2015 4.5  3.5 - 5.1 mmol/L Final  . Chloride 12/31/2015 95* 101 - 111 mmol/L Final  . CO2 12/31/2015 25  22 - 32 mmol/L Final  . Glucose, Bld 12/31/2015 94  65 - 99 mg/dL Final  . BUN 12/31/2015 36* 6 - 20 mg/dL Final  . Creatinine, Ser 12/31/2015 4.44* 0.44 - 1.00 mg/dL Final  . Calcium 12/31/2015 8.4* 8.9 - 10.3 mg/dL Final  . GFR calc non Af Amer 12/31/2015 9* >60 mL/min Final  . GFR calc Af Amer 12/31/2015 11* >60 mL/min Final   Comment: (NOTE) The eGFR has been calculated using the CKD EPI equation. This calculation has not been validated in all clinical situations. eGFR's persistently <60 mL/min signify possible Chronic Kidney Disease.   . Anion gap 12/31/2015 11  5 - 15 Final  . WBC 12/31/2015 17.6* 4.0 - 10.5 K/uL Final  . RBC 12/31/2015 2.95* 3.87 - 5.11 MIL/uL Final  . Hemoglobin 12/31/2015 7.9* 12.0 - 15.0 g/dL  Final  . HCT 12/31/2015 23.9* 36.0 - 46.0 % Final  . MCV 12/31/2015 81.0  78.0 - 100.0 fL Final  . MCH 12/31/2015 26.8  26.0 - 34.0 pg Final  . MCHC 12/31/2015 33.1  30.0 - 36.0 g/dL Final  . RDW 12/31/2015 16.5* 11.5 - 15.5 % Final  . Platelets 12/31/2015 312  150 - 400 K/uL Final  . Glucose-Capillary 12/30/2015 229* 65 - 99 mg/dL Final  . Glucose-Capillary 12/31/2015 128* 65 - 99 mg/dL Final  . Glucose-Capillary 12/31/2015 100* 65 - 99 mg/dL Final  . Comment 1 12/31/2015 Notify RN   Final  . Glucose-Capillary 12/31/2015 95  65 - 99 mg/dL Final  . Comment 1 12/31/2015 Notify RN   Final  . Glucose-Capillary 12/31/2015 101* 65 - 99 mg/dL Final  . Comment 1 12/31/2015 Notify RN   Final  . Glucose-Capillary 12/31/2015 89  65 - 99 mg/dL Final  . ABO/RH(D) 12/31/2015 O POS   Final  . Antibody Screen 12/31/2015 NEG   Final  . Sample Expiration 12/31/2015 01/03/2016   Final  . Glucose-Capillary 12/31/2015 107* 65 - 99 mg/dL Final  . Glucose-Capillary 12/31/2015 169* 65 - 99 mg/dL Final  . Comment 1 12/31/2015 Notify RN   Final  . Comment 2 12/31/2015 Document in Chart   Final  . Glucose-Capillary 12/31/2015 149* 65 - 99 mg/dL Final  . Comment 1 12/31/2015 Notify RN   Final  . Comment 2 12/31/2015 Document in Chart   Final  . Glucose-Capillary 01/01/2016 169* 65 - 99 mg/dL Final  . Sodium 01/01/2016 132* 135 - 145 mmol/L Final  . Potassium 01/01/2016 6.0* 3.5 - 5.1 mmol/L Final  . Chloride 01/01/2016 96* 101 - 111 mmol/L Final  . CO2 01/01/2016 22  22 - 32 mmol/L Final  . Glucose, Bld 01/01/2016 136* 65 - 99 mg/dL Final  . BUN 01/01/2016 47* 6 - 20 mg/dL Final  . Creatinine, Ser 01/01/2016 5.57* 0.44 - 1.00 mg/dL Final  . Calcium 01/01/2016 8.4* 8.9 - 10.3 mg/dL Final  . Phosphorus 01/01/2016 7.4* 2.5 - 4.6 mg/dL Final  . Albumin 01/01/2016 2.2* 3.5 - 5.0 g/dL Final  . GFR calc non Af Amer 01/01/2016 7* >60 mL/min Final  . GFR calc Af Amer 01/01/2016 8* >60 mL/min Final   Comment:  (NOTE) The eGFR has been calculated using the CKD  EPI equation. This calculation has not been validated in all clinical situations. eGFR's persistently <60 mL/min signify possible Chronic Kidney Disease.   . Anion gap 01/01/2016 14  5 - 15 Final  . WBC 01/01/2016 19.1* 4.0 - 10.5 K/uL Final  . RBC 01/01/2016 2.91* 3.87 - 5.11 MIL/uL Final  . Hemoglobin 01/01/2016 7.7* 12.0 - 15.0 g/dL Final  . HCT 01/01/2016 23.8* 36.0 - 46.0 % Final  . MCV 01/01/2016 81.8  78.0 - 100.0 fL Final  . MCH 01/01/2016 26.5  26.0 - 34.0 pg Final  . MCHC 01/01/2016 32.4  30.0 - 36.0 g/dL Final  . RDW 01/01/2016 17.1* 11.5 - 15.5 % Final  . Platelets 01/01/2016 353  150 - 400 K/uL Final  . Glucose-Capillary 01/01/2016 93  65 - 99 mg/dL Final  . Glucose-Capillary 01/01/2016 135* 65 - 99 mg/dL Final  . Comment 1 01/01/2016 Notify RN   Final  . Glucose-Capillary 01/01/2016 161* 65 - 99 mg/dL Final  . Comment 1 01/01/2016 Notify RN   Final  . Glucose-Capillary 01/02/2016 134* 65 - 99 mg/dL Final  . Comment 1 01/02/2016 Notify RN   Final  . Glucose-Capillary 01/02/2016 113* 65 - 99 mg/dL Final  . Comment 1 01/02/2016 Notify RN   Final  . Glucose-Capillary 01/02/2016 111* 65 - 99 mg/dL Final  . Comment 1 01/02/2016 Notify RN   Final  . Comment 2 01/02/2016 Document in Chart   Final  . Glucose-Capillary 01/02/2016 150* 65 - 99 mg/dL Final  . Comment 1 01/02/2016 Notify RN   Final  . Comment 2 01/02/2016 Document in Chart   Final  . Glucose-Capillary 01/02/2016 150* 65 - 99 mg/dL Final  . Glucose-Capillary 01/02/2016 162* 65 - 99 mg/dL Final  . Glucose-Capillary 01/02/2016 99  65 - 99 mg/dL Final  . Glucose-Capillary 01/03/2016 101* 65 - 99 mg/dL Final  . Glucose-Capillary 01/03/2016 210* 65 - 99 mg/dL Final  . Glucose-Capillary 01/03/2016 165* 65 - 99 mg/dL Final  . Glucose-Capillary 01/04/2016 197* 65 - 99 mg/dL Final  . Glucose-Capillary 01/04/2016 180* 65 - 99 mg/dL Final  . WBC 01/04/2016 11.2* 4.0 - 10.5  K/uL Final  . RBC 01/04/2016 2.61* 3.87 - 5.11 MIL/uL Final  . Hemoglobin 01/04/2016 7.1* 12.0 - 15.0 g/dL Final  . HCT 01/04/2016 21.6* 36.0 - 46.0 % Final  . MCV 01/04/2016 82.8  78.0 - 100.0 fL Final  . MCH 01/04/2016 27.2  26.0 - 34.0 pg Final  . MCHC 01/04/2016 32.9  30.0 - 36.0 g/dL Final  . RDW 01/04/2016 17.3* 11.5 - 15.5 % Final  . Platelets 01/04/2016 231  150 - 400 K/uL Final  . Sodium 01/04/2016 138  135 - 145 mmol/L Final  . Potassium 01/04/2016 3.3* 3.5 - 5.1 mmol/L Final  . Chloride 01/04/2016 99* 101 - 111 mmol/L Final  . CO2 01/04/2016 28  22 - 32 mmol/L Final  . Glucose, Bld 01/04/2016 90  65 - 99 mg/dL Final  . BUN 01/04/2016 17  6 - 20 mg/dL Final  . Creatinine, Ser 01/04/2016 2.53* 0.44 - 1.00 mg/dL Final  . Calcium 01/04/2016 8.2* 8.9 - 10.3 mg/dL Final  . Phosphorus 01/04/2016 2.9  2.5 - 4.6 mg/dL Final  . Albumin 01/04/2016 1.9* 3.5 - 5.0 g/dL Final  . GFR calc non Af Amer 01/04/2016 18* >60 mL/min Final  . GFR calc Af Amer 01/04/2016 21* >60 mL/min Final   Comment: (NOTE) The eGFR has been calculated using the CKD EPI equation.  This calculation has not been validated in all clinical situations. eGFR's persistently <60 mL/min signify possible Chronic Kidney Disease.   . Anion gap 01/04/2016 11  5 - 15 Final  . Glucose-Capillary 01/04/2016 119* 65 - 99 mg/dL Final  . Comment 1 01/04/2016 Notify RN   Final  . Comment 2 01/04/2016 Document in Chart   Final  Nursing Home on 12/17/2015  Component Date Value Ref Range Status  . Glucose 12/17/2015 207  mg/dL Final  Admission on 12/08/2015, Discharged on 12/14/2015  Component Date Value Ref Range Status  . Glucose-Capillary 12/08/2015 186* 65 - 99 mg/dL Final  . WBC 12/08/2015 13.2* 4.0 - 10.5 K/uL Final  . RBC 12/08/2015 4.09  3.87 - 5.11 MIL/uL Final  . Hemoglobin 12/08/2015 11.4* 12.0 - 15.0 g/dL Final  . HCT 12/08/2015 34.4* 36.0 - 46.0 % Final  . MCV 12/08/2015 84.1  78.0 - 100.0 fL Final  . MCH  12/08/2015 27.9  26.0 - 34.0 pg Final  . MCHC 12/08/2015 33.1  30.0 - 36.0 g/dL Final  . RDW 12/08/2015 16.5* 11.5 - 15.5 % Final  . Platelets 12/08/2015 139* 150 - 400 K/uL Final  . Neutrophils Relative % 12/08/2015 91  % Final  . Neutro Abs 12/08/2015 12.1* 1.7 - 7.7 K/uL Final  . Lymphocytes Relative 12/08/2015 3  % Final  . Lymphs Abs 12/08/2015 0.4* 0.7 - 4.0 K/uL Final  . Monocytes Relative 12/08/2015 5  % Final  . Monocytes Absolute 12/08/2015 0.6  0.1 - 1.0 K/uL Final  . Eosinophils Relative 12/08/2015 1  % Final  . Eosinophils Absolute 12/08/2015 0.2  0.0 - 0.7 K/uL Final  . Basophils Relative 12/08/2015 0  % Final  . Basophils Absolute 12/08/2015 0.0  0.0 - 0.1 K/uL Final  . Sodium 12/08/2015 135  135 - 145 mmol/L Final  . Potassium 12/08/2015 3.2* 3.5 - 5.1 mmol/L Final  . Chloride 12/08/2015 98* 101 - 111 mmol/L Final  . CO2 12/08/2015 24  22 - 32 mmol/L Final  . Glucose, Bld 12/08/2015 125* 65 - 99 mg/dL Final  . BUN 12/08/2015 25* 6 - 20 mg/dL Final  . Creatinine, Ser 12/08/2015 3.09* 0.44 - 1.00 mg/dL Final  . Calcium 12/08/2015 8.1* 8.9 - 10.3 mg/dL Final  . Total Protein 12/08/2015 7.2  6.5 - 8.1 g/dL Final  . Albumin 12/08/2015 2.8* 3.5 - 5.0 g/dL Final  . AST 12/08/2015 24  15 - 41 U/L Final  . ALT 12/08/2015 28  14 - 54 U/L Final  . Alkaline Phosphatase 12/08/2015 123  38 - 126 U/L Final  . Total Bilirubin 12/08/2015 0.6  0.3 - 1.2 mg/dL Final  . GFR calc non Af Amer 12/08/2015 14* >60 mL/min Final  . GFR calc Af Amer 12/08/2015 16* >60 mL/min Final   Comment: (NOTE) The eGFR has been calculated using the CKD EPI equation. This calculation has not been validated in all clinical situations. eGFR's persistently <60 mL/min signify possible Chronic Kidney Disease.   . Anion gap 12/08/2015 13  5 - 15 Final  . Lipase 12/08/2015 17  11 - 51 U/L Final  . Lactic Acid, Venous 12/08/2015 2.38* 0.5 - 1.9 mmol/L Final  . Comment 12/08/2015 NOTIFIED PHYSICIAN   Final  .  Specimen Description 12/13/2015 BLOOD RIGHT ARM   Final  . Special Requests 12/13/2015 BOTTLES DRAWN AEROBIC AND ANAEROBIC 5CC   Final  . Culture 12/13/2015 NO GROWTH 5 DAYS   Final  . Report Status 12/13/2015 12/13/2015  FINAL   Final  . Specimen Description 12/13/2015 BLOOD RIGHT HAND   Final  . Special Requests 12/13/2015 BOTTLES DRAWN AEROBIC AND ANAEROBIC 5CC   Final  . Culture 12/13/2015 NO GROWTH 5 DAYS   Final  . Report Status 12/13/2015 12/13/2015 FINAL   Final  . Color, Urine 12/08/2015 RED* YELLOW Final  . APPearance 12/08/2015 TURBID* CLEAR Final  . Specific Gravity, Urine 12/08/2015 1.013  1.005 - 1.030 Final  . pH 12/08/2015 8.5* 5.0 - 8.0 Final  . Glucose, UA 12/08/2015 NEGATIVE  NEGATIVE mg/dL Final  . Hgb urine dipstick 12/08/2015 LARGE* NEGATIVE Final  . Bilirubin Urine 12/08/2015 SMALL* NEGATIVE Final  . Ketones, ur 12/08/2015 NEGATIVE  NEGATIVE mg/dL Final  . Protein, ur 12/08/2015 >300* NEGATIVE mg/dL Final  . Nitrite 12/08/2015 NEGATIVE  NEGATIVE Final  . Leukocytes, UA 12/08/2015 LARGE* NEGATIVE Final  . Specimen Description 12/09/2015 URINE, RANDOM   Final  . Special Requests 12/09/2015 NONE   Final  . Culture 12/09/2015 MULTIPLE SPECIES PRESENT, SUGGEST RECOLLECTION*  Final  . Report Status 12/09/2015 12/09/2015 FINAL   Final  . Troponin i, poc 12/08/2015 0.03  0.00 - 0.08 ng/mL Final  . Comment 3 12/08/2015          Final   Comment: Due to the release kinetics of cTnI, a negative result within the first hours of the onset of symptoms does not rule out myocardial infarction with certainty. If myocardial infarction is still suspected, repeat the test at appropriate intervals.   . Glucose-Capillary 12/08/2015 124* 65 - 99 mg/dL Final  . Glucose-Capillary 12/08/2015 69  65 - 99 mg/dL Final  . Squamous Epithelial / LPF 12/08/2015 6-30* NONE SEEN Final  . WBC, UA 12/08/2015 TOO NUMEROUS TO COUNT  0 - 5 WBC/hpf Final  . RBC / HPF 12/08/2015 TOO NUMEROUS TO COUNT   0 - 5 RBC/hpf Final  . Bacteria, UA 12/08/2015 MANY* NONE SEEN Final  . Crystals 12/08/2015 TRIPLE PHOSPHATE CRYSTALS* NEGATIVE Final  . Urine-Other 12/08/2015 AMORPHOUS URATES/PHOSPHATES   Final  . Lactic Acid, Venous 12/08/2015 1.74  0.5 - 1.9 mmol/L Final  . Lactic Acid, Venous 12/08/2015 1.9  0.5 - 1.9 mmol/L Final  . Lactic Acid, Venous 12/09/2015 1.3  0.5 - 1.9 mmol/L Final  . Procalcitonin 12/08/2015 7.04  ng/mL Final   Comment:        Interpretation: PCT > 2 ng/mL: Systemic infection (sepsis) is likely, unless other causes are known. (NOTE)         ICU PCT Algorithm               Non ICU PCT Algorithm    ----------------------------     ------------------------------         PCT < 0.25 ng/mL                 PCT < 0.1 ng/mL     Stopping of antibiotics            Stopping of antibiotics       strongly encouraged.               strongly encouraged.    ----------------------------     ------------------------------       PCT level decrease by               PCT < 0.25 ng/mL       >= 80% from peak PCT       OR PCT 0.25 - 0.5 ng/mL  Stopping of antibiotics                                             encouraged.     Stopping of antibiotics           encouraged.    ----------------------------     ------------------------------       PCT level decrease by              PCT >= 0.25 ng/mL       < 80% from peak PCT        AND PCT >= 0.5 ng/mL            Continuing antibiotics                                                                        encouraged.       Continuing antibiotics            encouraged.    ----------------------------     ------------------------------     PCT level increase compared          PCT > 0.5 ng/mL         with peak PCT AND          PCT >= 0.5 ng/mL             Escalation of antibiotics                                          strongly encouraged.      Escalation of antibiotics        strongly encouraged.   . Prothrombin Time 12/08/2015  15.4* 11.4 - 15.2 seconds Final  . INR 12/08/2015 1.21   Final  . aPTT 12/08/2015 34  24 - 36 seconds Final  . Sodium 12/09/2015 131* 135 - 145 mmol/L Final  . Potassium 12/09/2015 4.4  3.5 - 5.1 mmol/L Final  . Chloride 12/09/2015 92* 101 - 111 mmol/L Final  . CO2 12/09/2015 27  22 - 32 mmol/L Final  . Glucose, Bld 12/09/2015 141* 65 - 99 mg/dL Final  . BUN 12/09/2015 31* 6 - 20 mg/dL Final  . Creatinine, Ser 12/09/2015 3.59* 0.44 - 1.00 mg/dL Final  . Calcium 12/09/2015 8.1* 8.9 - 10.3 mg/dL Final  . GFR calc non Af Amer 12/09/2015 12* >60 mL/min Final  . GFR calc Af Amer 12/09/2015 14* >60 mL/min Final   Comment: (NOTE) The eGFR has been calculated using the CKD EPI equation. This calculation has not been validated in all clinical situations. eGFR's persistently <60 mL/min signify possible Chronic Kidney Disease.   . Anion gap 12/09/2015 12  5 - 15 Final  . WBC 12/09/2015 7.8  4.0 - 10.5 K/uL Final  . RBC 12/09/2015 3.59* 3.87 - 5.11 MIL/uL Final  . Hemoglobin 12/09/2015 9.7* 12.0 - 15.0 g/dL Final  . HCT 12/09/2015 29.5* 36.0 - 46.0 % Final  . MCV 12/09/2015 82.2  78.0 - 100.0 fL Final  . [facility] 12/09/2015  27.0  26.0 - 34.0 pg Final  . MCHC 12/09/2015 32.9  30.0 - 36.0 g/dL Final  . RDW 12/09/2015 16.4* 11.5 - 15.5 % Final  . Platelets 12/09/2015 148* 150 - 400 K/uL Final  . Glucose-Capillary 12/08/2015 81  65 - 99 mg/dL Final  . MRSA by PCR 12/09/2015 NEGATIVE  NEGATIVE Final   Comment:        The GeneXpert MRSA Assay (FDA approved for NASAL specimens only), is one component of a comprehensive MRSA colonization surveillance program. It is not intended to diagnose MRSA infection nor to guide or monitor treatment for MRSA infections.   . Glucose-Capillary 12/09/2015 158* 65 - 99 mg/dL Final  . Comment 1 12/09/2015 Notify RN   Final  . Glucose-Capillary 12/09/2015 208* 65 - 99 mg/dL Final  . Hgb A1c MFr Bld 12/10/2015 8.8* 4.8 - 5.6 % Final   Comment: (NOTE)          Pre-diabetes: 5.7 - 6.4         Diabetes: >6.4         Glycemic control for adults with diabetes: <7.0   . Mean Plasma Glucose 12/10/2015 206  mg/dL Final   Comment: (NOTE) Performed At: [facility] [place], [place] [id number] Lindon Romp MD [id number]   . Glucose-Capillary 12/09/2015 234* 65 - 99 mg/dL Final  . Glucose-Capillary 12/09/2015 232* 65 - 99 mg/dL Final  . Glucose-Capillary 12/09/2015 62* 65 - 99 mg/dL Final  . Glucose-Capillary 12/09/2015 82  65 - 99 mg/dL Final  . Glucose-Capillary 12/09/2015 114* 65 - 99 mg/dL Final  . Comment 1 12/09/2015 Notify RN   Final  . Glucose-Capillary 12/10/2015 70  65 - 99 mg/dL Final  . Glucose-Capillary 12/10/2015 123* 65 - 99 mg/dL Final  . Sodium 12/10/2015 129* 135 - 145 mmol/L Final  . Potassium 12/10/2015 4.8  3.5 - 5.1 mmol/L Final  . Chloride 12/10/2015 92* 101 - 111 mmol/L Final  . CO2 12/10/2015 24  22 - 32 mmol/L Final  . Glucose, Bld 12/10/2015 172* 65 - 99 mg/dL Final  . BUN 12/10/2015 42* 6 - 20 mg/dL Final  . Creatinine, Ser 12/10/2015 4.94* 0.44 - 1.00 mg/dL Final  . Calcium 12/10/2015 9.0  8.9 - 10.3 mg/dL Final  . Phosphorus 12/10/2015 6.7* 2.5 - 4.6 mg/dL Final  . Albumin 12/10/2015 2.5* 3.5 - 5.0 g/dL Final  . GFR calc non Af Amer 12/10/2015 8* >60 mL/min Final  . GFR calc Af Amer 12/10/2015 9* >60 mL/min Final   Comment: (NOTE) The eGFR has been calculated using the CKD EPI equation. This calculation has not been validated in all clinical situations. eGFR's persistently <60 mL/min signify possible Chronic Kidney Disease.   . Anion gap 12/10/2015 13  5 - 15 Final  . WBC 12/10/2015 8.9  4.0 - 10.5 K/uL Final  . RBC 12/10/2015 3.52* 3.87 - 5.11 MIL/uL Final  . Hemoglobin 12/10/2015 9.6* 12.0 - 15.0 g/dL Final  . HCT 12/10/2015 29.3* 36.0 - 46.0 % Final  . MCV 12/10/2015 83.2  78.0 - 100.0 fL Final  . MCH 12/10/2015 27.3  26.0 - 34.0 pg Final  . MCHC 12/10/2015 32.8  30.0 - 36.0  g/dL Final  . RDW 12/10/2015 16.6* 11.5 - 15.5 % Final  . Platelets 12/10/2015 189  150 - 400 K/uL Final  . Glucose-Capillary 12/10/2015 102* 65 - 99 mg/dL Final  . Glucose-Capillary 12/10/2015 162* 65 - 99 mg/dL Final  . Glucose-Capillary 12/11/2015 99  65 - 99 mg/dL Final  . Glucose-Capillary 12/11/2015 178* 65 - 99 mg/dL Final  . Glucose-Capillary 12/11/2015 168* 65 - 99 mg/dL Final  . Glucose-Capillary 12/11/2015 151* 65 - 99 mg/dL Final  . Glucose-Capillary 12/12/2015 74  65 - 99 mg/dL Final  . Glucose-Capillary 12/12/2015 144* 65 - 99 mg/dL Final  . Glucose-Capillary 12/12/2015 120* 65 - 99 mg/dL Final  . Glucose-Capillary 12/13/2015 78  65 - 99 mg/dL Final  . Glucose-Capillary 12/13/2015 190* 65 - 99 mg/dL Final  . Glucose-Capillary 12/13/2015 147* 65 - 99 mg/dL Final  . Glucose-Capillary 12/13/2015 114* 65 - 99 mg/dL Final  . Glucose-Capillary 12/14/2015 133* 65 - 99 mg/dL Final  . Glucose-Capillary 12/14/2015 201* 65 - 99 mg/dL Final  . Comment 1 12/14/2015 Notify RN   Final  . Comment 2 12/14/2015 Document in Chart   Final    Dg Chest Port 1 View  Result Date: 12/08/2015 CLINICAL DATA:  Sepsis and hypothermia EXAM: PORTABLE CHEST 1 VIEW COMPARISON:  Chest radiograph 03/09/2015 FINDINGS: There is calcification within the aortic arch. Small bilateral pleural effusions. The right-sided effusion has increased in size. There diffusely increased pulmonary markings throughout both lungs. No focal area of consolidation. No pneumothorax. IMPRESSION: Slightly increased size of right pleural effusion. Unchanged left pleural effusion. No focal airspace consolidation. Electronically Signed   By: Ulyses Jarred M.D.   On: 12/08/2015 18:04   Dg Foot Complete Right  Result Date: 12/30/2015 CLINICAL DATA:  Necrotic toes of the right foot. EXAM: RIGHT FOOT COMPLETE - 3+ VIEW COMPARISON:  None. FINDINGS: There is extensive arterial vascular calcification in the foot. There is gas in the soft  tissues throughout the plantar aspect of the foot, most prominent in the forefoot but extending under the plantar aspect of the calcaneus. No discrete bone destruction or fractures. 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 Ankle joint appears normal. 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 No radiographic evidence of osteomyelitis. Extensive arterial vascular calcifications in the foot and ankle. Electronically Signed   By: Lorriane Shire M.D.   On: 12/30/2015 16:37     Assessment/Plan   ICD-9-CM ICD-10-CM   1. S/P AKA (above knee amputation), right ([place]) V49.76 Z89.611   2. Physical deconditioning 799.3 R53.81   3. Peripheral artery disease (HCC) 443.9 I73.9   4. ESRD on hemodialysis (HCC) 585.6 N18.6    V45.11 Z99.2   5. Type 2 diabetes mellitus with chronic kidney disease on chronic dialysis, with long-term current use of insulin (HCC) 250.40 E11.22    585.9 N18.6    V45.11 Z99.2    V58.67 Z79.4   6. Anemia of chronic disease 285.29 D63.8   7. Protein-calorie malnutrition, severe ([place]) 262 E43   8. Chronic diastolic congestive heart failure (HCC) 428.32 I50.32    428.0    9. 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 Communicated with pt and nursing.  Will follow   Tenessa Marsee S. Perlie Gold  [facility] [address] [place], [place] [address] [phone] Cell (Monday-Friday 8 AM - 5 PM) [phone] After 5 PM  and follow prompts

## 2016-01-06 ENCOUNTER — Telehealth: Payer: Self-pay

## 2016-01-06 NOTE — Telephone Encounter (Signed)
Re-admission to facility. This is a patient you were seeing at [facility]. [facility] Hospital F/U is needed since patient was re-admitted to facility upon discharge. Hospital discharge from [facility] on 01/04/2016.

## 2016-01-11 ENCOUNTER — Encounter: Payer: Self-pay | Admitting: Adult Health

## 2016-01-25 IMAGING — CR DG ABDOMEN ACUTE W/ 1V CHEST
3 series · 3 of 3 positions shown · non-contrast
Comparison: May 09, 2014.

CLINICAL DATA: Acute nausea, vomiting and diarrhea.

EXAM:
DG ABDOMEN ACUTE W/ 1V CHEST

[abdomen supine]
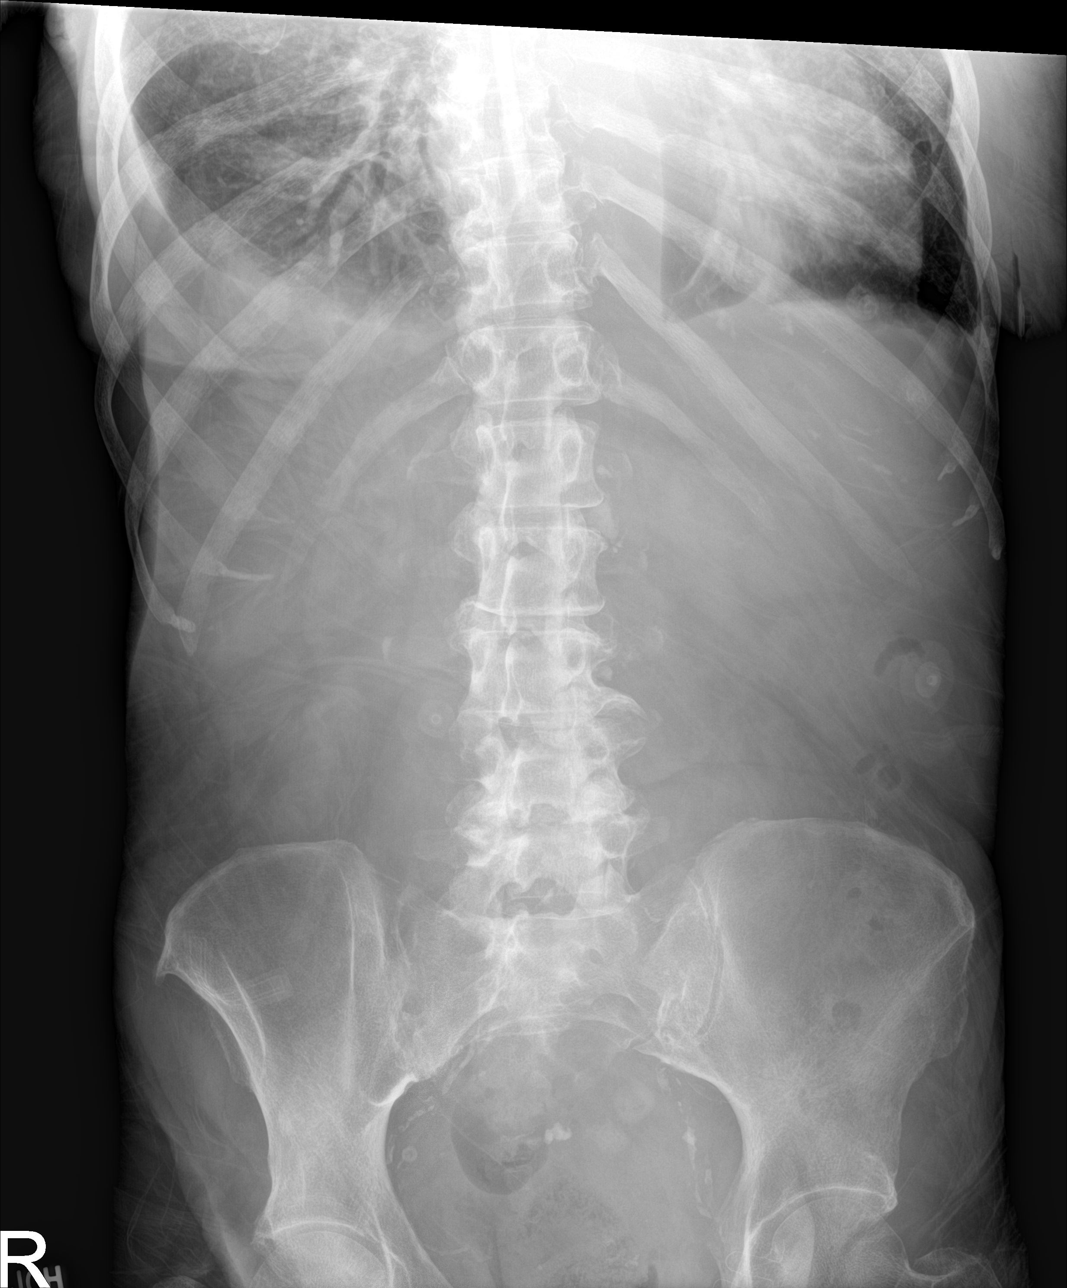

[chest ap]
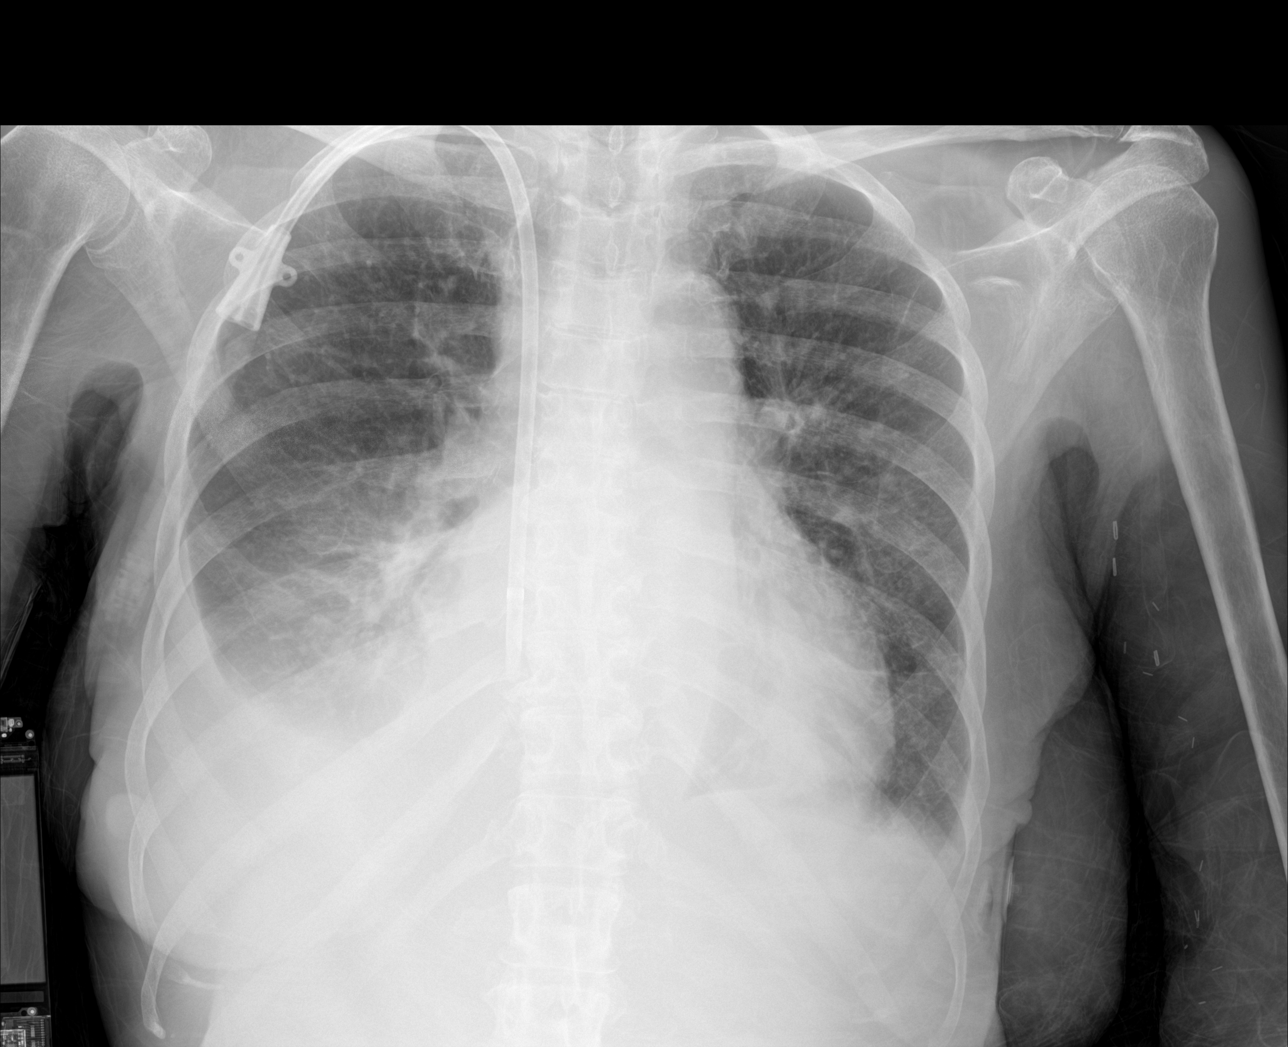

[abdomen decu]
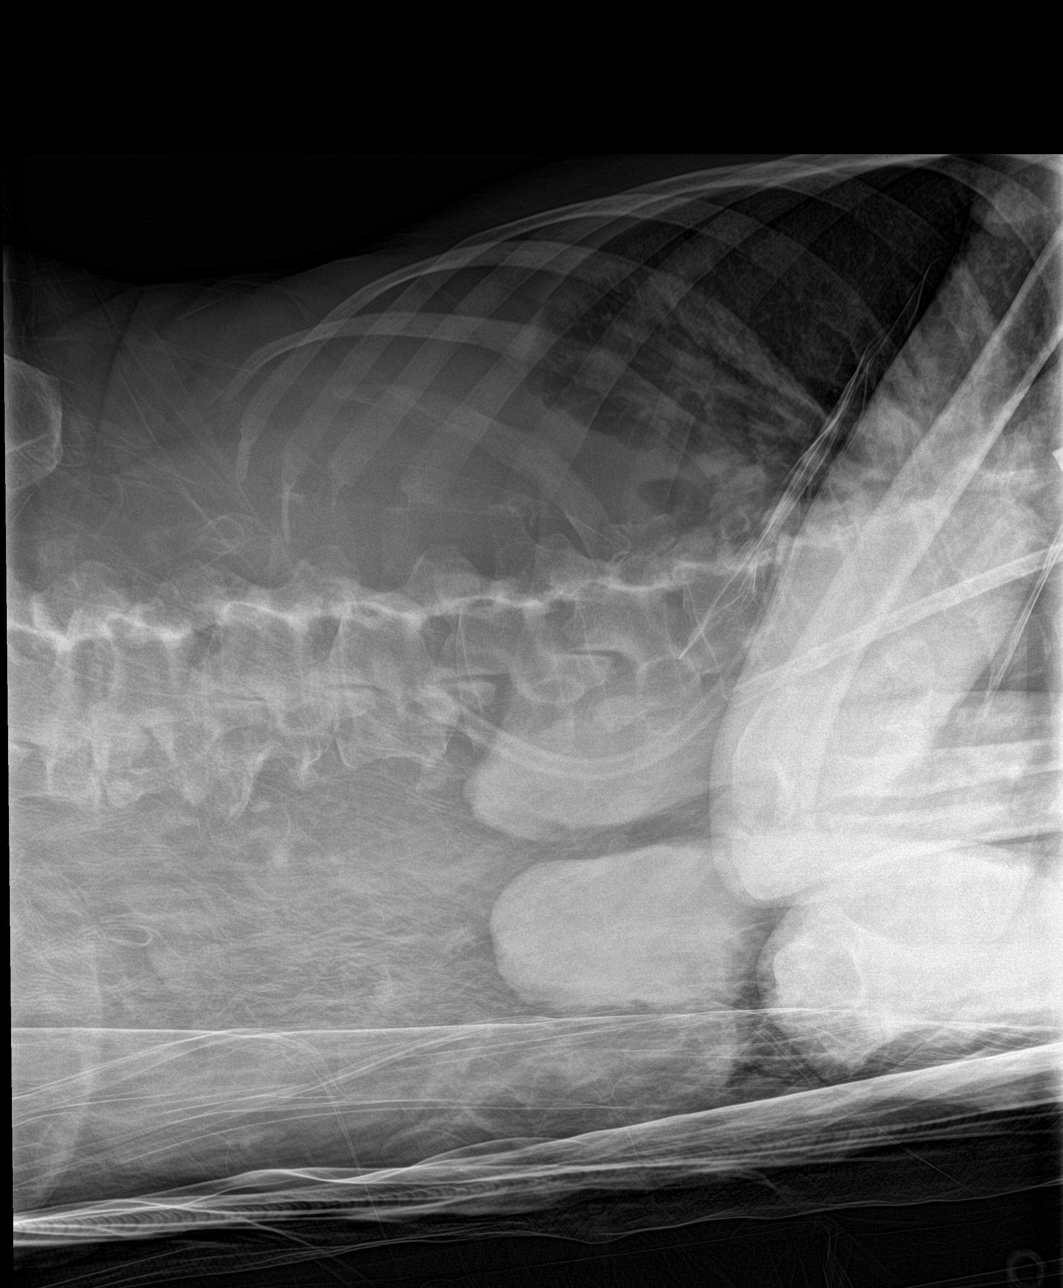

[3 of 3 positions shown; findings below may reference images not displayed]

FINDINGS: There is no evidence of dilated bowel loops or free intraperitoneal
air. No radiopaque calculi or other significant radiographic
abnormality is seen. Stable cardiomediastinal silhouette. Right
internal jugular dialysis catheter is unchanged in position.
Moderate right pleural effusion is noted which is increased compared
to prior exam. Mild diffuse interstitial densities are noted
consistent with pulmonary edema.
IMPRESSION: No evidence of bowel obstruction or ileus. Mild bilateral pulmonary
edema is noted with moderate right pleural effusion.

## 2016-01-27 ENCOUNTER — Encounter: Payer: Self-pay | Admitting: Vascular Surgery

## 2016-02-01 NOTE — Progress Notes (Signed)
    Postoperative Visit   History of Present Illness  Elizabeth [name] is a 70 y.o. female who presents for postoperative follow-up for: R above-the-knee amputation (Date: 12/31/15).  The patient's wounds are healed.  The patient notes pain is well controlled.  The patient's current symptoms are: none.  For VQI Use Only  PRE-ADM LIVING: Nursing home  AMB STATUS: Ambulatory  Physical Examination  Vitals:   02/05/16 1010  BP: 105/72  Pulse: (!) 111  Resp: 18  Temp: 98.2 F (36.8 C)   RLE: Incision is healed.  Staples are intact.  Medical Decision Making  Elizabeth [name] is a 70 y.o. female who presents s/p R above-the-knee ampuation.   The patient's stump is healing appropriately with resolution of pre-operative symptoms.  The patient can follow up with [place] as needed.  Leonides Sake, MD, FACS [facility] Office: [phone] Pager: [phone]

## 2016-02-05 ENCOUNTER — Ambulatory Visit (INDEPENDENT_AMBULATORY_CARE_PROVIDER_SITE_OTHER): Payer: Medicare Other | Admitting: Vascular Surgery

## 2016-02-05 ENCOUNTER — Encounter: Payer: Self-pay | Admitting: Vascular Surgery

## 2016-02-05 VITALS — BP 105/72 | HR 111 | Temp 98.2°F | Resp 18

## 2016-02-05 DIAGNOSIS — Z89611 Acquired absence of right leg above knee: Secondary | ICD-10-CM | POA: Insufficient documentation

## 2016-02-07 IMAGING — CR DG CHEST 1V PORT
1 series · 1 of 1 positions shown · non-contrast
Comparison: Chest radiographs 06/12/2014 and earlier.

CLINICAL DATA: 69-year-old female with cough and hypoglycemia.
Initial encounter.

EXAM:
PORTABLE CHEST - 1 VIEW

[AP]
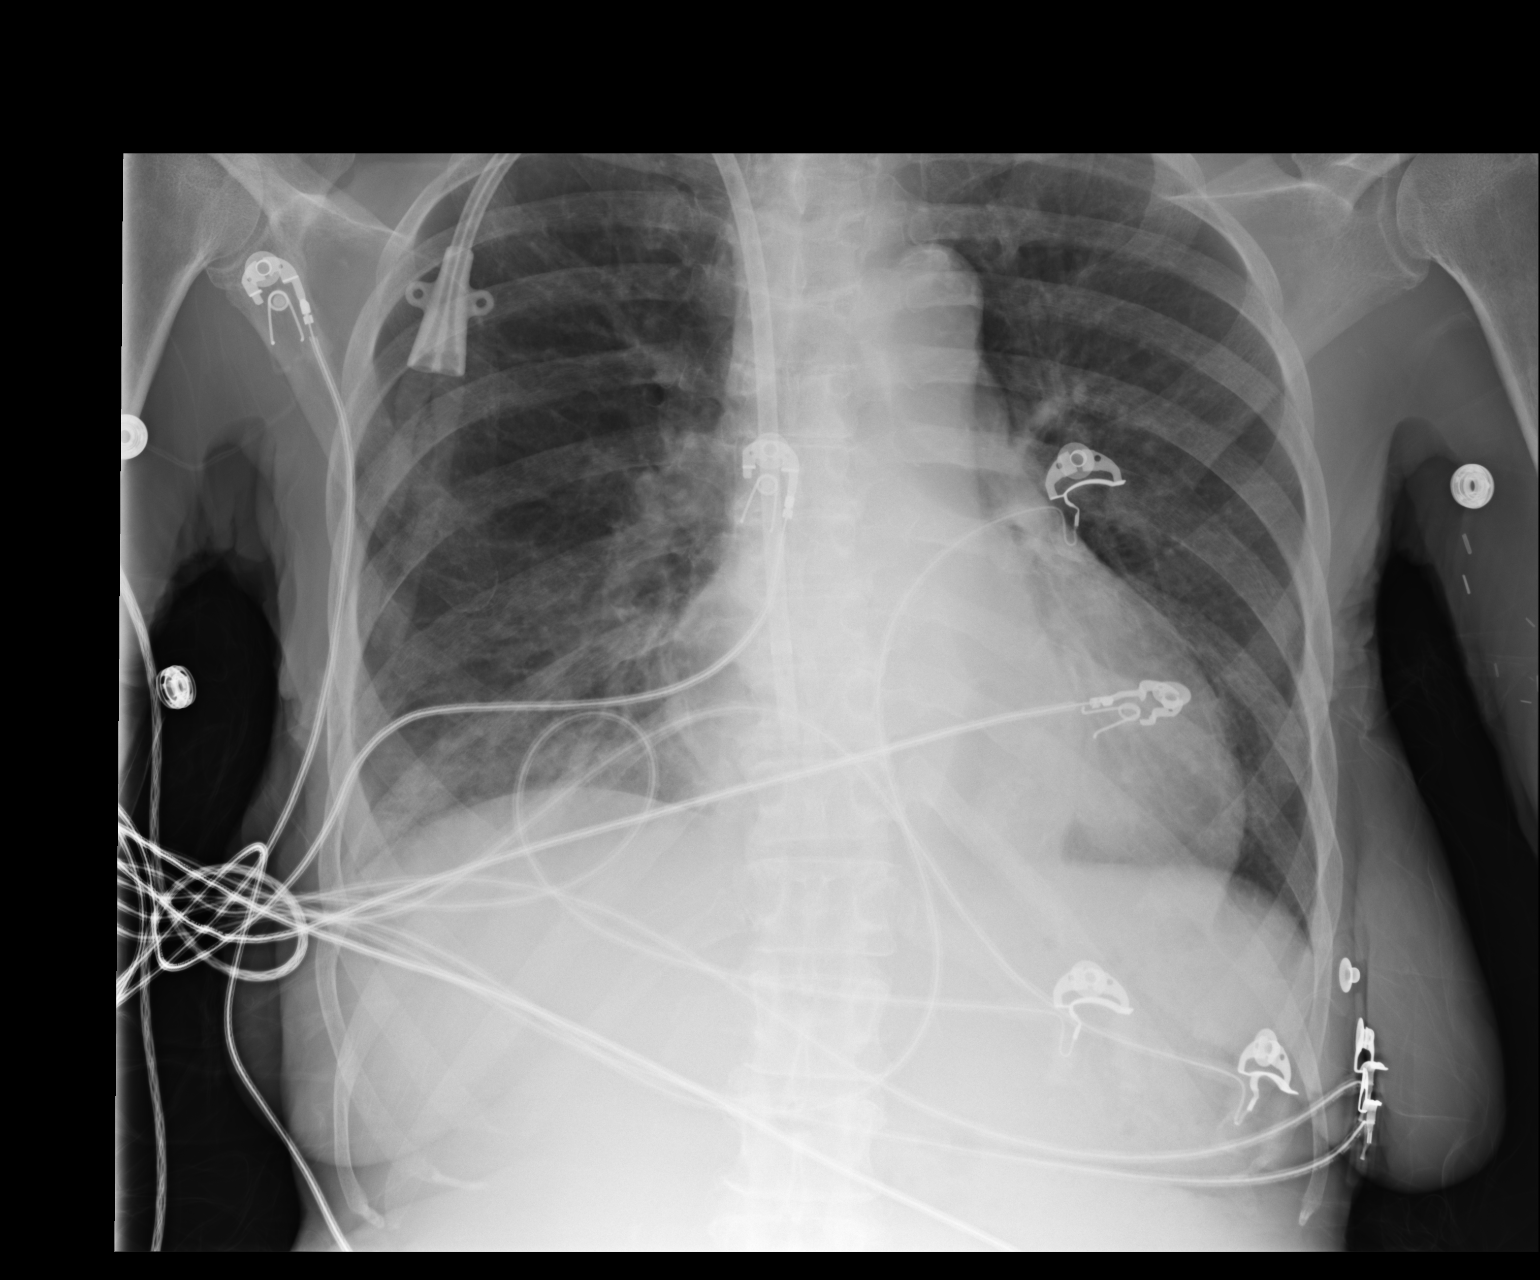

[1 of 1 positions shown; findings below may reference images not displayed]

FINDINGS: Portable AP semi upright view at 1732 hours. Interval regressed
bilateral pleural effusions and improved bibasilar ventilation.
Stable cardiomegaly and mediastinal contours. Stable tunneled right
chest dual lumen dialysis type catheter. No pneumothorax. No acute
pulmonary edema. No consolidation.
IMPRESSION: Interval regressed pleural effusions and improved bibasilar
ventilation. No new cardiopulmonary abnormality.

## 2016-02-08 ENCOUNTER — Encounter: Payer: Self-pay | Admitting: Adult Health

## 2016-02-08 ENCOUNTER — Non-Acute Institutional Stay (SKILLED_NURSING_FACILITY): Payer: Medicare Other | Admitting: Adult Health

## 2016-02-08 DIAGNOSIS — I5032 Chronic diastolic (congestive) heart failure: Secondary | ICD-10-CM | POA: Diagnosis not present

## 2016-02-08 DIAGNOSIS — Z89519 Acquired absence of unspecified leg below knee: Secondary | ICD-10-CM | POA: Diagnosis not present

## 2016-02-08 DIAGNOSIS — E43 Unspecified severe protein-calorie malnutrition: Secondary | ICD-10-CM | POA: Diagnosis not present

## 2016-02-08 DIAGNOSIS — N186 End stage renal disease: Secondary | ICD-10-CM | POA: Diagnosis not present

## 2016-02-08 DIAGNOSIS — K220 Achalasia of cardia: Secondary | ICD-10-CM | POA: Diagnosis not present

## 2016-02-08 DIAGNOSIS — L98429 Non-pressure chronic ulcer of back with unspecified severity: Secondary | ICD-10-CM

## 2016-02-08 DIAGNOSIS — Z992 Dependence on renal dialysis: Secondary | ICD-10-CM

## 2016-02-08 DIAGNOSIS — Z89611 Acquired absence of right leg above knee: Secondary | ICD-10-CM | POA: Diagnosis not present

## 2016-02-08 DIAGNOSIS — E1169 Type 2 diabetes mellitus with other specified complication: Secondary | ICD-10-CM | POA: Diagnosis not present

## 2016-02-08 DIAGNOSIS — I739 Peripheral vascular disease, unspecified: Secondary | ICD-10-CM

## 2016-02-08 DIAGNOSIS — E1122 Type 2 diabetes mellitus with diabetic chronic kidney disease: Secondary | ICD-10-CM

## 2016-02-08 DIAGNOSIS — L89154 Pressure ulcer of sacral region, stage 4: Secondary | ICD-10-CM | POA: Diagnosis not present

## 2016-02-08 DIAGNOSIS — I2510 Atherosclerotic heart disease of native coronary artery without angina pectoris: Secondary | ICD-10-CM

## 2016-02-08 DIAGNOSIS — E785 Hyperlipidemia, unspecified: Secondary | ICD-10-CM

## 2016-02-08 DIAGNOSIS — Z794 Long term (current) use of insulin: Secondary | ICD-10-CM

## 2016-02-08 NOTE — Progress Notes (Signed)
Location:   Psychiatrist of Service:  SNF (31)   CODE STATUS: dnr  No Known Allergies  Chief Complaint  Patient presents with  . Medical Management of Chronic Issues    HPI:  She is a long term resident of this facility being seen for the management of her chronic illnesses. Her right aka has healed. She is not voicing any concerns at this time. There are no nursing concerns at this time. Several of her medications were stopped per her request.   Past Medical History:  Diagnosis Date  . Acute hyperkalemia 12/24/2013  . Anemia   . Anemia of chronic disease 04/24/2012  . Arthritis of knee, right    "right knee" (06/12/2014)  . Chronic indwelling Foley catheter    "it's been in since ~ 2014" (12/30/2015)  . Coronary artery disease, non-occlusive January 2016   Moderate p&mLAD & Ostial OM; Myoview Nuclear Stress Test 03/21/2014: EF 35%. Mild hypokinesis of the inferior wall with fixed inferior defect suggesting scar. (no culprit lesion on CATH)  . Critical lower limb ischemia    Status post left BKA following left popliteal and peroneal artery endarterectomy with patch angioplasty.  . Diabetes mellitus, insulin dependent (IDDM), uncontrolled ([place])    Brittle; has h/o Hypo & Hyperglycemic episodes, Type II  . Diabetic neuropathy ([place])   . ESRD (end stage renal disease) on dialysis [facility])    [place], [place], [place] (12/30/2015)  . Foley catheter in place    for urinary retention  . Foot infection   . Gout    "was in my LLE"  . Hematemesis April 2016   Hospitalized  . History of CVA (cerebrovascular accident) 08/19/2013   "minor one"  . Hyperlipidemia with target LDL less than 70   . Loss of consciousness ([place]) 08/19/2013  . 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 Urinary retention    DX  NEUROGENIC BLADDER--HAS INDWELLING FOLEY CATHETER  . UTI (urinary tract infection) 10/17/2013    Past Surgical History:  Procedure Laterality Date  . ABDOMINAL AORTAGRAM N/A 03/19/2014   Procedure: ABDOMINAL Maxcine Ham;  Surgeon: Elam Dutch, MD;  Location: [facility] CATH LAB;  Service: Cardiovascular;  Laterality: N/A;  . AMPUTATION Left 03/26/2014   Procedure: AMPUTATION SECOND LEFT TOE;  Surgeon: Elam Dutch, MD;  Location: [place];  Service: Vascular;  Laterality: Left;  . AMPUTATION Left 05/10/2014   Procedure: AMPUTATION BELOW KNEE;  Surgeon: Rosetta Posner, MD;  Location: [place];  Service: Vascular;  Laterality: Left;  . AMPUTATION Right 12/31/2015   Procedure: AMPUTATION ABOVE KNEE RIGHT;  Surgeon: Conrad [place], MD;  Location: [place];  Service: Vascular;  Laterality: Right;  . ANGIOPLASTY Left 04/29/2013   Procedure: ANGIOPLASTY;  Surgeon: Angelia Mould, MD;  Location: [facility] CATH LAB;  Service: Cardiovascular;  Laterality: Left;  . APPLICATION OF A-CELL OF EXTREMITY N/A 10/29/2014   Procedure: PLACEMENT OF APPLICATION OF A-CELL;  Surgeon: Theodoro Kos, DO;  Location: [place];  Service: Plastics;  Laterality: N/A;  . AV FISTULA PLACEMENT Left 04/28/2014   Procedure: CREATION OF LEFT BRACHIOCEPHALIC  ARTERIOVENOUS (AV) FISTULA CREATION;  Surgeon: Conrad Mabel, MD;  Location: [place];  Service: Vascular;  Laterality: Left;  . BASCILIC VEIN TRANSPOSITION Left 10/30/2012   Procedure: LEFT 1ST STAGE BASCILIC VEIN  TRANSPOSITION;  Surgeon: Angelia Mould, MD;  Location: [place];  Service: Vascular;  Laterality: Left;  . BASCILIC VEIN TRANSPOSITION Left 02/05/2013   Procedure: BASCILIC VEIN TRANSPOSITION 2ND STAGE;  Surgeon: Angelia Mould, MD;  Location: [place];  Service: Vascular;  Laterality: Left;  . BELOW KNEE LEG AMPUTATION Left   . ENDARTERECTOMY POPLITEAL Left 03/26/2014   Procedure: Left popliteal and peroneal artery endarterectomy with vein patch angioplasty;  Surgeon: Elam Dutch,  MD;  Location: [place];  Service: Vascular;  Laterality: Left;  . ESOPHAGEAL MANOMETRY N/A 11/24/2014   Procedure: ESOPHAGEAL MANOMETRY (EM);  Surgeon: Carol Ada, MD;  Location: [facility];  Service: Endoscopy;  Laterality: N/A;  . ESOPHAGOGASTRODUODENOSCOPY N/A 06/13/2014   Procedure: ESOPHAGOGASTRODUODENOSCOPY (EGD);  Surgeon: Carol Ada, MD;  Location: [facility] ENDOSCOPY;  Service: Endoscopy;  Laterality: N/A;  . FISTULOGRAM Left 04/29/2013   Procedure: FISTULOGRAM;  Surgeon: Angelia Mould, MD;  Location: [facility] CATH LAB;  Service: Cardiovascular;  Laterality: Left;  . FISTULOGRAM Left 10/08/2014   Procedure: LEFT ARM FISTULOGRAM;  Surgeon: Conrad [place], MD;  Location: [place];  Service: Vascular;  Laterality: Left;  . INCISION AND DRAINAGE OF WOUND N/A 10/29/2014   Procedure: IRRIGATION AND DEBRIDEMENT OF SACRAL WOUND, PLACEMENT OF A-CELL;  Surgeon: Theodoro Kos, DO;  Location: [place];  Service: Plastics;  Laterality: N/A;  . INSERTION OF SUPRAPUBIC CATHETER N/A 05/16/2012   Procedure: INSERTION OF SUPRAPUBIC CATHETER;  Surgeon: Alexis Frock, MD;  Location: [facility];  Service: Urology;  Laterality: N/A;  . LEFT AND RIGHT HEART CATHETERIZATION WITH CORONARY ANGIOGRAM N/A 03/24/2014   Procedure: LEFT AND RIGHT HEART CATHETERIZATION WITH CORONARY ANGIOGRAM;  Surgeon: Leonie Man, MD;  Location: [facility] CATH LAB;  Service: Cardiovascular; Moderate disease in p-mLAD & Ostial OM1; normal right heart pressures. Wedge pressure 29 mmHg. --No lesion to explain inferior defect on Myoview and echocardiogram.   . NM MYOCAR PERF WALL MOTION  04/19/2012/ 02/2014   a) low risk study-EF 44%,mild inferolateral hypkinesis; b) EF 35%. Mild hypokinesis of the inferior wall with fixed inferior defect suggesting scar.  Marland Kitchen RADIOLOGY WITH ANESTHESIA N/A 10/25/2013   Procedure: RADIOLOGY WITH ANESTHESIA;  Surgeon: Medication Radiologist, MD;  Location: [place];  Service: Radiology;  Laterality: N/A;  . TRANSTHORACIC ECHOCARDIOGRAM   03/22/2014   EF 30-35%, mild LVH. Severe inferior hypokinesis. Mild MR.  Marland Kitchen UPPER GI ENDOSCOPY  03/06/2015   Procedure: UPPER GI ENDOSCOPY;  Surgeon: Michael Boston, MD;  Location: [facility];  Service: General;;  . VENOPLASTY Left 10/08/2014   Procedure: LEFT CEPHALIC VEIN VENOPLASTY;  Surgeon: Conrad [place], MD;  Location: [place];  Service: Vascular;  Laterality: Left;    Social History   Social History  . Marital status: Single    Spouse name: N/A  . Number of children: N/A  . Years of education: N/A   Occupational History  . disabled    Social History Main Topics  . Smoking status: Never Smoker  . Smokeless tobacco: Never Used  . Alcohol use No     Comment: "stopped drinking in 2013 drank a couple beers/day, 2 days/wk"  . Drug use: No  . Sexual activity: No   Other Topics Concern  . Not on file   Social History Narrative   Granddaughter is contact      Resident of [facility]- SNF    Family History  Problem Relation Age of Onset  . Hyperlipidemia Mother   . Hypertension Mother   .  Hodgkin's lymphoma Father   . Heart disease Sister       VITAL SIGNS BP 108/68   Pulse 100   Temp 97.8 F (36.6 C)   Resp 20   Ht 4' 3"  (1.295 m)   Wt 80 lb 12.8 oz (36.7 kg)   SpO2 98%   BMI 21.84 kg/m   Patient's Medications  New Prescriptions   No medications on file  Previous Medications   ASCORBIC ACID (VITAMIN C) 500 MG TABLET    Take 500 mg by mouth 2 (two) times daily.   ASPIRIN EC 81 MG TABLET    Take 81 mg by mouth daily.   INSULIN ASPART (NOVOLOG) 100 UNIT/ML INJECTION    200-250 give 2 units, 251-300 give 4 units, 301-350 give 6 units, 351-400 give 8 units, More than 400-call M.D.   LIDOCAINE-PRILOCAINE (EMLA) CREAM    Apply 1 application topically Every Tuesday,Thursday,and Saturday with dialysis.    MULTIPLE VITAMINS-MINERALS (DECUBI-VITE) CAPS    Take 1 capsule by mouth daily.   ONDANSETRON (ZOFRAN-ODT) 4 MG DISINTEGRATING TABLET    Take 4 mg by mouth every 6  (six) hours as needed for nausea or vomiting.   OXYCODONE-ACETAMINOPHEN (PERCOCET/ROXICET) 5-325 MG TABLET    Take 1 tablet by mouth every 8 (eight) hours as needed for severe pain.   PANTOPRAZOLE (PROTONIX) 40 MG TABLET    Take 1 tablet (40 mg total) by mouth daily.   SEVELAMER CARBONATE (RENVELA) 800 MG TABLET    Take 1,600 mg by mouth 3 (three) times daily with meals.  Modified Medications   No medications on file  Discontinued Medications     SIGNIFICANT DIAGNOSTIC EXAMS   03-22-14: 2-d echo: - Left ventricle: The cavity size was normal. Wall thickness was increased in a pattern of mild LVH. Systolic function was moderately to severely reduced. The estimated ejection fraction was in the range of 30% to 35%. Severe hypokinesis of the inferior myocardium. - Mitral valve: There was mild regurgitation. - Left atrium: The atrium was mildly dilated. - Pulmonary arteries: PA peak pressure: 31 mm Hg (S). - Pericardium, extracardiac: A trivial pericardial effusion was identified. There was a left pleural effusion.  03-08-15: barium swallow: 1. Narrow but patent gastroesophageal junction. Contrast does flows into the stomach but a large portion of the contrast remains within the patulous esophagus. Recommend caution in advancing diet. 2. No esophageal leak  03-09-15: kub: Normal small bowel gas pattern. Residual contrast material throughout the colon. No colonic distension. Degenerative changes lumbar spine  03-09-15: chest x-ray: Enlarged cardiac silhouette. Small bilateral pleural effusions. Airspace opacity in the right cardiophrenic angle, which likely represents focal airspace consolidation.   12-08-15: chest x-ray: Slightly increased size of right pleural effusion. Unchanged left pleural effusion. No focal airspace consolidation.    LABS REVIEWED:   03-06-15: wbc 5.2; hgb 11.2; hct 35.7; mcv 85.0; plt 190; glucose 107; bun 37; creat 3.31; k+ 4.6; na++139 hgb a1c 7.5  03-09-15: urine  culture: yeast; blood culture: no growth 03-11-15: stool for c-diff: neg 03-12-15: wbc 5.3; hgb 9.8; hct 31.1; mcv 85.1; plt 144; glucose 144; bun 28; creat 4.17; k+ 4.5; na++130; phos 3.7;  albumin 2.0  03-18-15: glucose 66; bun 22; creat 3.19; k+ 4.5; na++137; alk phos 140; albumin 2.6; ca++ 7.7; chol 130; ldl 60; trig 90; hdl 52  04-29-25: influenza swab: neg  05-25-15: pre-albumin 24  06-12-15: hgb a1c 7.4  07-10-15: wbc 4.5; hgb 11.2; hct 37.1; mcv 89.4; plt 125; glucose  118; bun 18; creat 2.97; k+ 3.7; na++ 135; liver normal albumin 2.9; chol 186; ldl 90; trig 101; hdl 76 12-08-15: wbc 13.2; hgb 11.4; hct 34.4; mcv 84.1; plt 139; glucose 125; bun 25; creat 3.09; k+ 3.2; na++ 135; liver normal albumin 2.8; blood culture: no growth; urine culture: multiple bacteria  12-09-15: wbc 7.8 ;hgb 9.7; hct 29.5; mcv 82.2; plt 148; glucose 141; bun 31; creat 3.59; k+ 4.4 ;na++ 131; hgb a1c 8.8  12-30-15: wbc 19.9; hgb 8.8; hct 26.9; mcv 82.5; plt 316; glucose 161; bun 39; creat 4.16; k+ 4.6; na++ 132 01-01-16: wbc 19.1; hgb 7.7; hct 23.8; mcv 81.8; plt 353; glucose 136; bun 47; creat 5.57; k+ 6.0; an++ 131; phos 7.4; albumin 2.2 12-25-15: wbc 11.2; hgb 7.1; hct 21.;6 mcv 82.8; plt 231; glucose 90; bun 17; creat 2.53; k+ 3.3; na++ 138; phos 2.9; albumin 1.9  01-06-16: wbc 9.8; hgb 9.0; hct 30.3; mcv 93.3; plt 299; glucose 151; bun 33.3; create 4.23; k+ 6.0; na++ 137    Review of Systems  Constitutional: Negative for malaise/fatigue.  Respiratory: negative for cough . Negative for shortness of breath.    Cardiovascular: Negative for chest pain, palpitations and leg swelling.  Gastrointestinal: no abdominal pain no constipation  Musculoskeletal: Negative for myalgias, back pain and joint pain.  Skin: Negative.   Neurological: Negative for dizziness.  Psychiatric/Behavioral: The patient is not nervous/anxious.      Physical Exam Constitutional: No distress.  Frail   Eyes: Conjunctivae are normal.  Neck:  Neck supple. No JVD present. No thyromegaly present.  Cardiovascular: Normal rate, regular rhythm    Respiratory: Effort normal and breath sounds normal. No respiratory distress. She has no wheezes.  GI: Soft. Bowel sounds are normal. She exhibits no distension. There is no tenderness.    Musculoskeletal: She exhibits no edema.  Able to move all extremities  Left bka Right aka  Has foley    Lymphadenopathy:    She has no cervical adenopathy.  Neurological: She is alert.  Skin: Skin is warm and dry. She is not diaphoretic.  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  Has left upper extremity A/V fistula +thrill + bruit       ASSESSMENT/ PLAN:  1. ESRD: followed by nephrology; is on hemodialysis three days per week. Is on 1200 cc fluid restriction; will continue renvela 1600 mg three times daily will monitor  2. Diabetes; hgb a1c is 8.8 will continue novolog SSI 200-250=2 units; 251-300=4 units; 301-350= 6 units; 351-400= 8 units   3. achalasia of esophagus status post Heller/Toupet on 03-06-15: will continue protonix 40 mg daily; will monitor  4. Dyslipidemia:   ldl is 90 her pravachol was stopped per her request   5. Anemia of chronic disease: hgb is 9.0; will monitor   6. CAD: has 3 vessel disease is status post cath on 03-24-14: no complaint of chest pain present; will continue asa 81 mg daily   7. Chronic systolic heart failure: is presently stable her EF is 30%-35%; without symptoms of fluid overload present.   8. PAD: will continue asa 81 mg daily is status post left bka and right aka   9. Protein calorie malnutrition: will continue supplements per facility protocol her current weight is 80  pounds; her albumin is 1.9  10. Allergic rhinitis: her is presently off medications; will monitor   11. Urine retention: has  long term foley   12. 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    MD is aware of resident's narcotic use and is in agreement with current plan of care. We will attempt to wean resident as apropriate   Ok Edwards NP [facility]  Contact [phone] Monday through Friday 8am- 5pm  After hours call [phone]

## 2016-02-12 ENCOUNTER — Ambulatory Visit
Admission: RE | Admit: 2016-02-12 | Discharge: 2016-02-12 | Disposition: A | Discharge status: Home / Self Care | Payer: Medicare Other | Source: Ambulatory Visit | Attending: Internal Medicine | Admitting: Internal Medicine

## 2016-02-12 DIAGNOSIS — Z1231 Encounter for screening mammogram for malignant neoplasm of breast: Secondary | ICD-10-CM

## 2016-02-12 DIAGNOSIS — E2839 Other primary ovarian failure: Secondary | ICD-10-CM

## 2016-03-06 ENCOUNTER — Inpatient Hospital Stay (HOSPITAL_COMMUNITY)
Admission: EM | Admit: 2016-03-06 | Discharge: 2016-03-24 | DRG: 871 | Disposition: E | Payer: Medicare Other | Attending: Internal Medicine | Admitting: Internal Medicine

## 2016-03-06 ENCOUNTER — Emergency Department (HOSPITAL_COMMUNITY): Payer: Medicare Other

## 2016-03-06 ENCOUNTER — Encounter (HOSPITAL_COMMUNITY): Payer: Self-pay | Admitting: Emergency Medicine

## 2016-03-06 DIAGNOSIS — J189 Pneumonia, unspecified organism: Secondary | ICD-10-CM | POA: Diagnosis present

## 2016-03-06 DIAGNOSIS — E860 Dehydration: Secondary | ICD-10-CM | POA: Diagnosis present

## 2016-03-06 DIAGNOSIS — Z8673 Personal history of transient ischemic attack (TIA), and cerebral infarction without residual deficits: Secondary | ICD-10-CM

## 2016-03-06 DIAGNOSIS — N186 End stage renal disease: Secondary | ICD-10-CM | POA: Diagnosis present

## 2016-03-06 DIAGNOSIS — A419 Sepsis, unspecified organism: Secondary | ICD-10-CM | POA: Diagnosis not present

## 2016-03-06 DIAGNOSIS — E1129 Type 2 diabetes mellitus with other diabetic kidney complication: Secondary | ICD-10-CM | POA: Diagnosis present

## 2016-03-06 DIAGNOSIS — E1121 Type 2 diabetes mellitus with diabetic nephropathy: Secondary | ICD-10-CM | POA: Diagnosis present

## 2016-03-06 DIAGNOSIS — E785 Hyperlipidemia, unspecified: Secondary | ICD-10-CM | POA: Diagnosis present

## 2016-03-06 DIAGNOSIS — L89154 Pressure ulcer of sacral region, stage 4: Secondary | ICD-10-CM | POA: Diagnosis present

## 2016-03-06 DIAGNOSIS — G9340 Encephalopathy, unspecified: Secondary | ICD-10-CM | POA: Diagnosis present

## 2016-03-06 DIAGNOSIS — Z794 Long term (current) use of insulin: Secondary | ICD-10-CM

## 2016-03-06 DIAGNOSIS — E11649 Type 2 diabetes mellitus with hypoglycemia without coma: Secondary | ICD-10-CM | POA: Diagnosis present

## 2016-03-06 DIAGNOSIS — E1140 Type 2 diabetes mellitus with diabetic neuropathy, unspecified: Secondary | ICD-10-CM | POA: Diagnosis present

## 2016-03-06 DIAGNOSIS — Z89611 Acquired absence of right leg above knee: Secondary | ICD-10-CM

## 2016-03-06 DIAGNOSIS — Y95 Nosocomial condition: Secondary | ICD-10-CM | POA: Diagnosis present

## 2016-03-06 DIAGNOSIS — I255 Ischemic cardiomyopathy: Secondary | ICD-10-CM | POA: Diagnosis present

## 2016-03-06 DIAGNOSIS — E1122 Type 2 diabetes mellitus with diabetic chronic kidney disease: Secondary | ICD-10-CM | POA: Diagnosis present

## 2016-03-06 DIAGNOSIS — Z7982 Long term (current) use of aspirin: Secondary | ICD-10-CM

## 2016-03-06 DIAGNOSIS — Z807 Family history of other malignant neoplasms of lymphoid, hematopoietic and related tissues: Secondary | ICD-10-CM

## 2016-03-06 DIAGNOSIS — I132 Hypertensive heart and chronic kidney disease with heart failure and with stage 5 chronic kidney disease, or end stage renal disease: Secondary | ICD-10-CM | POA: Diagnosis present

## 2016-03-06 DIAGNOSIS — Z681 Body mass index (BMI) 19 or less, adult: Secondary | ICD-10-CM

## 2016-03-06 DIAGNOSIS — R64 Cachexia: Secondary | ICD-10-CM | POA: Diagnosis present

## 2016-03-06 DIAGNOSIS — Z89519 Acquired absence of unspecified leg below knee: Secondary | ICD-10-CM

## 2016-03-06 DIAGNOSIS — Z992 Dependence on renal dialysis: Secondary | ICD-10-CM

## 2016-03-06 DIAGNOSIS — Z8249 Family history of ischemic heart disease and other diseases of the circulatory system: Secondary | ICD-10-CM

## 2016-03-06 DIAGNOSIS — R531 Weakness: Secondary | ICD-10-CM | POA: Diagnosis not present

## 2016-03-06 DIAGNOSIS — Z89512 Acquired absence of left leg below knee: Secondary | ICD-10-CM

## 2016-03-06 DIAGNOSIS — I9589 Other hypotension: Secondary | ICD-10-CM | POA: Diagnosis present

## 2016-03-06 DIAGNOSIS — E1151 Type 2 diabetes mellitus with diabetic peripheral angiopathy without gangrene: Secondary | ICD-10-CM | POA: Diagnosis present

## 2016-03-06 DIAGNOSIS — Z515 Encounter for palliative care: Secondary | ICD-10-CM | POA: Diagnosis not present

## 2016-03-06 DIAGNOSIS — L98429 Non-pressure chronic ulcer of back with unspecified severity: Secondary | ICD-10-CM | POA: Diagnosis present

## 2016-03-06 DIAGNOSIS — D638 Anemia in other chronic diseases classified elsewhere: Secondary | ICD-10-CM | POA: Diagnosis present

## 2016-03-06 DIAGNOSIS — R627 Adult failure to thrive: Secondary | ICD-10-CM | POA: Diagnosis present

## 2016-03-06 DIAGNOSIS — I444 Left anterior fascicular block: Secondary | ICD-10-CM | POA: Diagnosis present

## 2016-03-06 DIAGNOSIS — N319 Neuromuscular dysfunction of bladder, unspecified: Secondary | ICD-10-CM | POA: Diagnosis present

## 2016-03-06 DIAGNOSIS — Z66 Do not resuscitate: Secondary | ICD-10-CM | POA: Diagnosis present

## 2016-03-06 DIAGNOSIS — D696 Thrombocytopenia, unspecified: Secondary | ICD-10-CM | POA: Diagnosis present

## 2016-03-06 DIAGNOSIS — I5032 Chronic diastolic (congestive) heart failure: Secondary | ICD-10-CM | POA: Diagnosis present

## 2016-03-06 DIAGNOSIS — I2510 Atherosclerotic heart disease of native coronary artery without angina pectoris: Secondary | ICD-10-CM | POA: Diagnosis present

## 2016-03-06 LAB — CBC WITH DIFFERENTIAL/PLATELET
Basophils Absolute: 0 10*3/uL (ref 0.0–0.1)
Basophils Relative: 0 %
EOS ABS: 0 10*3/uL (ref 0.0–0.7)
EOS PCT: 0 %
HCT: 34.7 % — ABNORMAL LOW (ref 36.0–46.0)
Hemoglobin: 11.4 g/dL — ABNORMAL LOW (ref 12.0–15.0)
LYMPHS ABS: 0.3 10*3/uL — AB (ref 0.7–4.0)
Lymphocytes Relative: 2 %
MCH: 26.8 pg (ref 26.0–34.0)
MCHC: 32.9 g/dL (ref 30.0–36.0)
MCV: 81.6 fL (ref 78.0–100.0)
MONO ABS: 0.3 10*3/uL (ref 0.1–1.0)
MONOS PCT: 2 %
NEUTROS PCT: 96 %
Neutro Abs: 13.4 10*3/uL — ABNORMAL HIGH (ref 1.7–7.7)
PLATELETS: 124 10*3/uL — AB (ref 150–400)
RBC: 4.25 MIL/uL (ref 3.87–5.11)
RDW: 17.9 % — AB (ref 11.5–15.5)
WBC: 14 10*3/uL — ABNORMAL HIGH (ref 4.0–10.5)

## 2016-03-06 LAB — COMPREHENSIVE METABOLIC PANEL
ALK PHOS: 128 U/L — AB (ref 38–126)
ALT: 23 U/L (ref 14–54)
AST: 31 U/L (ref 15–41)
Albumin: 2.1 g/dL — ABNORMAL LOW (ref 3.5–5.0)
Anion gap: 14 (ref 5–15)
BUN: 29 mg/dL — AB (ref 6–20)
CALCIUM: 8.9 mg/dL (ref 8.9–10.3)
CO2: 23 mmol/L (ref 22–32)
CREATININE: 2.66 mg/dL — AB (ref 0.44–1.00)
Chloride: 99 mmol/L — ABNORMAL LOW (ref 101–111)
GFR calc non Af Amer: 17 mL/min — ABNORMAL LOW (ref >60)
GFR, EST AFRICAN AMERICAN: 20 mL/min — AB (ref >60)
GLUCOSE: 135 mg/dL — AB (ref 65–99)
Potassium: 4.3 mmol/L (ref 3.5–5.1)
SODIUM: 136 mmol/L (ref 135–145)
Total Bilirubin: 1 mg/dL (ref 0.3–1.2)
Total Protein: 6.2 g/dL — ABNORMAL LOW (ref 6.5–8.1)

## 2016-03-06 LAB — GLUCOSE, CAPILLARY
Glucose-Capillary: 109 mg/dL — ABNORMAL HIGH (ref 65–99)
Glucose-Capillary: 78 mg/dL (ref 65–99)

## 2016-03-06 LAB — INFLUENZA PANEL BY PCR (TYPE A & B)
INFLAPCR: NEGATIVE
INFLBPCR: NEGATIVE

## 2016-03-06 MED ORDER — VANCOMYCIN HCL IN DEXTROSE 750-5 MG/150ML-% IV SOLN
750.0000 mg | Freq: Once | INTRAVENOUS | Status: AC
  Administered 2016-03-06: 750 mg via INTRAVENOUS
  Filled 2016-03-06: qty 150

## 2016-03-06 MED ORDER — ASPIRIN EC 81 MG PO TBEC
81.0000 mg | DELAYED_RELEASE_TABLET | Freq: Every day | ORAL | Status: DC
  Administered 2016-03-06: 81 mg via ORAL
  Filled 2016-03-06 (×2): qty 1

## 2016-03-06 MED ORDER — DEXTROSE 5 % IV SOLN: 1.0000 g | Freq: Three times a day (TID) | INTRAVENOUS | Status: DC

## 2016-03-06 MED ORDER — INSULIN ASPART 100 UNIT/ML ~~LOC~~ SOLN: 0.0000 [IU] | Freq: Three times a day (TID) | SUBCUTANEOUS | Status: DC

## 2016-03-06 MED ORDER — HEPARIN SODIUM (PORCINE) 5000 UNIT/ML IJ SOLN
5000.0000 [IU] | Freq: Three times a day (TID) | INTRAMUSCULAR | Status: DC
  Administered 2016-03-06 – 2016-03-07 (×3): 5000 [IU] via SUBCUTANEOUS
  Filled 2016-03-06 (×3): qty 1

## 2016-03-06 MED ORDER — ONDANSETRON HCL 4 MG/2ML IJ SOLN
4.0000 mg | Freq: Once | INTRAMUSCULAR | Status: AC
  Administered 2016-03-06: 4 mg via INTRAVENOUS
  Filled 2016-03-06: qty 2

## 2016-03-06 MED ORDER — DEXTROSE 5 % IV SOLN
1.0000 g | Freq: Once | INTRAVENOUS | Status: AC
  Administered 2016-03-06: 1 g via INTRAVENOUS
  Filled 2016-03-06: qty 1

## 2016-03-06 MED ORDER — SODIUM CHLORIDE 0.9 % IV SOLN
INTRAVENOUS | Status: DC
  Administered 2016-03-06: 23:00:00 via INTRAVENOUS

## 2016-03-06 MED ORDER — FENTANYL CITRATE (PF) 100 MCG/2ML IJ SOLN
25.0000 ug | Freq: Once | INTRAMUSCULAR | Status: AC
  Administered 2016-03-06: 25 ug via INTRAVENOUS
  Filled 2016-03-06: qty 2

## 2016-03-06 MED ORDER — SODIUM CHLORIDE 0.9 % IV SOLN
Freq: Once | INTRAVENOUS | Status: AC
  Administered 2016-03-06: 10:00:00 via INTRAVENOUS

## 2016-03-06 MED ORDER — DEXTROSE 5 % IV SOLN
1.0000 g | INTRAVENOUS | Status: DC
  Filled 2016-03-06: qty 1

## 2016-03-06 NOTE — ED Triage Notes (Addendum)
Pt here from [facility], facility/DNR paperwork at bedside. Pt had dialysis yesterday. Pt has been feeling weak since yesterday. Pt reports her back hurts and her neck hurts. Pt is bilateral BKA. Pt normally hypotensive. 70s systolic with EMS so EMS gave her 200cc NS. PIV 22G placed to R hand. CBG 200. No bleeding to dialysis access in L arm. Pt also reports nausea. Pt arrives to ED with bowel movement.

## 2016-03-06 NOTE — ED Provider Notes (Signed)
[facility]-EMERGENCY DEPT Provider Note   CSN: [id number] Arrival date & time: 03/22/2016  0915     History   Chief Complaint Chief Complaint  Patient presents with  . Weakness    HPI Elizabeth [name] is a 71 y.o. female.  HPI Patient presents from her nursing facility with staff concerns of weakness, which the patient acknowledges Patient has multiple medical issues including end-stage renal disease, last had dialysis yesterday. However, the patient herself is unclear when she had dialysis, offering different accounting. The patient herself denies focal pain, including specifically no head pain, chest pain, neck pain, abdominal pain. The patient denies nausea, states that she feels weak in general. She seems to deny any recent changes in diet or medication. EMS reports the patient was hypotensive in route, and states that nursing home staff report she does typically hypotensive. No report of fever, vomiting, diarrhea.    Past Medical History:  Diagnosis Date  . Acute hyperkalemia 12/24/2013  . Anemia   . Anemia of chronic disease 04/24/2012  . Arthritis of knee, right    "right knee" (06/12/2014)  . Chronic indwelling Foley catheter    "it's been in since ~ 2014" (12/30/2015)  . Coronary artery disease, non-occlusive January 2016   Moderate p&mLAD & Ostial OM; Myoview Nuclear Stress Test 03/21/2014: EF 35%. Mild hypokinesis of the inferior wall with fixed inferior defect suggesting scar. (no culprit lesion on CATH)  . Critical lower limb ischemia    Status post left BKA following left popliteal and peroneal artery endarterectomy with patch angioplasty.  . Diabetes mellitus, insulin dependent (IDDM), uncontrolled (HCC)    Brittle; has h/o Hypo & Hyperglycemic episodes, Type II  . Diabetic neuropathy (HCC)   . ESRD (end stage renal disease) on dialysis [facility])    [address], [place], [place] (12/30/2015)  . Foley catheter in place    for urinary retention  . Foot infection   . Gout    "was in my LLE"  . Hematemesis April 2016   Hospitalized  . History of CVA (cerebrovascular accident) 08/19/2013   "minor one"  . Hyperlipidemia with target LDL less than 70   . Loss of consciousness (HCC) 08/19/2013  . 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 Urinary retention    DX NEUROGENIC BLADDER--HAS INDWELLING FOLEY CATHETER  . UTI (urinary tract infection) 10/17/2013    Patient Active Problem List   Diagnosis Date Noted  . S/P AKA (above knee amputation) unilateral, right (HCC) 02/05/2016  . Gas gangrene (HCC) 12/30/2015  . Palliative care encounter   . Sepsis (HCC) 12/08/2015  . Chronic diastolic congestive heart failure (HCC) 08/06/2015  . Dyslipidemia associated with type 2 diabetes mellitus (HCC) 03/16/2015  . 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 Venous stenosis of left upper extremity 02/06/2015  . 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 DM (diabetes mellitus) type II controlled with renal manifestation (HCC) 06/30/2014  . S/P BKA (below knee amputation) unilateral (HCC) 05/28/2014  . Peripheral artery disease (HCC) 04/16/2014  . Moderate 3V CAD at cath 03/24/14   . Cardiomyopathy, ischemic   . Neuropathy (HCC) 03/17/2014  . Protein-calorie malnutrition, severe (HCC) 12/27/2013  . ESRD on hemodialysis (HCC) 10/17/2013  . Hypoglycemia 08/19/2013  . Hypothermia 04/24/2012  . Anemia of chronic disease 04/24/2012  Past Surgical History:  Procedure Laterality Date  . ABDOMINAL AORTAGRAM N/A 03/19/2014   Procedure: ABDOMINAL Ronny Flurry;  Surgeon: Sherren Kerns, MD;  Location: [facility] CATH LAB;  Service: Cardiovascular;  Laterality: N/A;  . AMPUTATION Left 03/26/2014   Procedure: AMPUTATION SECOND LEFT TOE;  Surgeon: Sherren Kerns, MD;  Location[facility]  OR;  Service: Vascular;  Laterality: Left;  . AMPUTATION Left 05/10/2014   Procedure: AMPUTATION BELOW KNEE;  Surgeon: Larina Earthly, MD;  Location: [facility] OR;  Service: Vascular;  Laterality: Left;  . AMPUTATION Right 12/31/2015   Procedure: AMPUTATION ABOVE KNEE RIGHT;  Surgeon: Fransisco Hertz, MD;  Location: [facility] OR;  Service: Vascular;  Laterality: Right;  . ANGIOPLASTY Left 04/29/2013   Procedure: ANGIOPLASTY;  Surgeon: Chuck Hint, MD;  Location: [facility] CATH LAB;  Service: Cardiovascular;  Laterality: Left;  . APPLICATION OF A-CELL OF EXTREMITY N/A 10/29/2014   Procedure: PLACEMENT OF APPLICATION OF A-CELL;  Surgeon: Wayland Denis, DO;  [facility]: [facility] OR;  Service: Plastics;  Laterality: N/A;  . AV FISTULA PLACEMENT Left 04/28/2014   Procedure: CREATION OF LEFT BRACHIOCEPHALIC  ARTERIOVENOUS (AV) FISTULA CREATION;  Surgeon: Fransisco Hertz, MD;  [facility]: [facility] OR;  Service: Vascular;  Laterality: Left;  . BASCILIC VEIN TRANSPOSITION Left 10/30/2012   Procedure: LEFT 1ST STAGE BASCILIC VEIN TRANSPOSITION;  Surgeon: Chuck Hint, MD;  Location: [facility] OR;  Service: Vascular;  Laterality: Left;  . BASCILIC VEIN TRANSPOSITION Left 02/05/2013   Procedure: BASCILIC VEIN TRANSPOSITION 2ND STAGE;  Surgeon: Chuck Hint, MD;  Location: [facility] OR;  Service: Vascular;  Laterality: Left;  . BELOW KNEE LEG AMPUTATION Left   . ENDARTERECTOMY POPLITEAL Left 03/26/2014   Procedure: Left popliteal and peroneal artery endarterectomy with vein patch angioplasty;  Surgeon: Sherren Kerns, MD;  Location: [facility] OR;  Service: Vascular;  Laterality: Left;  . ESOPHAGEAL MANOMETRY N/A 11/24/2014   Procedure: ESOPHAGEAL MANOMETRY (EM);  Surgeon: Jeani Hawking, MD;  Location: [facility] ENDOSCOPY;  Service: Endoscopy;  Laterality: N/A;  . ESOPHAGOGASTRODUODENOSCOPY N/A 06/13/2014   Procedure: ESOPHAGOGASTRODUODENOSCOPY (EGD);  Surgeon: Jeani Hawking, MD;  Location: [facility] ENDOSCOPY;  Service: Endoscopy;  Laterality: N/A;  . FISTULOGRAM Left  04/29/2013   Procedure: FISTULOGRAM;  Surgeon: Chuck Hint, MD;  Location: [facility] CATH LAB;  Service: Cardiovascular;  Laterality: Left;  . FISTULOGRAM Left 10/08/2014   Procedure: LEFT ARM FISTULOGRAM;  Surgeon: Fransisco Hertz, MD;  Location: [facility] OR;  Service: Vascular;  Laterality: Left;  . INCISION AND DRAINAGE OF WOUND N/A 10/29/2014   Procedure: IRRIGATION AND DEBRIDEMENT OF SACRAL WOUND, PLACEMENT OF A-CELL;  Surgeon: Wayland Denis, DO;  Location: [facility] OR;  Service: Plastics;  Laterality: N/A;  . INSERTION OF SUPRAPUBIC CATHETER N/A 05/16/2012   Procedure: INSERTION OF SUPRAPUBIC CATHETER;  Surgeon: Sebastian Ache, MD;  Location: [facility] ORS;  Service: Urology;  Laterality: N/A;  . LEFT AND RIGHT HEART CATHETERIZATION WITH CORONARY ANGIOGRAM N/A 03/24/2014   Procedure: LEFT AND RIGHT HEART CATHETERIZATION WITH CORONARY ANGIOGRAM;  Surgeon: Marykay Lex, MD;  Location: [facility] CATH LAB;  Service: Cardiovascular; Moderate disease in p-mLAD & Ostial OM1; normal right heart pressures. Wedge pressure 29 mmHg. --No lesion to explain inferior defect on Myoview and echocardiogram.   . NM MYOCAR PERF WALL MOTION  04/19/2012/ 02/2014   a) low risk study-EF 44%,mild inferolateral hypkinesis; b) EF 35%. Mild hypokinesis of the inferior wall with fixed inferior defect suggesting scar.  Marland Kitchen RADIOLOGY WITH ANESTHESIA N/A 10/25/2013   Procedure: RADIOLOGY WITH ANESTHESIA;  Surgeon: Medication Radiologist, MD;  Location: [facility] OR;  Service: Radiology;  Laterality: N/A;  . TRANSTHORACIC ECHOCARDIOGRAM  03/22/2014   EF 30-35%, mild LVH. Severe inferior hypokinesis. Mild MR.  Marland Kitchen UPPER GI ENDOSCOPY  03/06/2015   Procedure: UPPER GI ENDOSCOPY;  Surgeon: Karie Soda, MD;  Location: [facility] ORS;  Service: General;;  . VENOPLASTY Left 10/08/2014   Procedure: LEFT CEPHALIC VEIN VENOPLASTY;  Surgeon: Fransisco Hertz, MD;  Location: [facility] OR;  Service: Vascular;  Laterality: Left;    OB History    No data available       Home Medications     Prior to Admission medications   Medication Sig Start Date End Date Taking? Authorizing Provider  ascorbic acid (VITAMIN C) 500 MG tablet Take 500 mg by mouth 2 (two) times daily.   Yes Historical Provider, MD  aspirin EC 81 MG tablet Take 81 mg by mouth daily.   Yes Historical Provider, MD  lidocaine-prilocaine (EMLA) cream Apply 1 application topically Every Tuesday,Thursday,and Saturday with dialysis.    Yes Historical Provider, MD  Multiple Vitamins-Minerals (DECUBI-VITE) CAPS Take 1 capsule by mouth daily.   Yes Historical Provider, MD  Nutritional Supplements (NUTRITIONAL SHAKE PO) Take 1 Units by mouth 3 (three) times daily with meals.   Yes Historical Provider, MD  oxyCODONE-acetaminophen (PERCOCET/ROXICET) 5-325 MG tablet Take 1 tablet by mouth every 8 (eight) hours as needed for severe pain. 01/04/16  Yes Ripudeep Jenna Luo, MD  pantoprazole (PROTONIX) 40 MG tablet Take 1 tablet (40 mg total) by mouth daily. 05/13/14  Yes Joseph Art, DO  Protein POWD Take 2 scoop by mouth 3 (three) times daily with meals.   Yes Historical Provider, MD  sevelamer carbonate (RENVELA) 0.8 g PACK packet Take 1.6 g by mouth 3 (three) times daily with meals.    Yes Historical Provider, MD  insulin aspart (NOVOLOG) 100 UNIT/ML injection 200-250 give 2 units, 251-300 give 4 units, 301-350 give 6 units, 351-400 give 8 units, More than 400-call M.D. 12/14/15   Maretta Bees, MD  ondansetron (ZOFRAN-ODT) 4 MG disintegrating tablet Take 4 mg by mouth every 6 (six) hours as needed for nausea or vomiting.    Historical Provider, MD    Family History Family History  Problem Relation Age of Onset  . Hyperlipidemia Mother   . Hypertension Mother   . Hodgkin's lymphoma Father   . Heart disease Sister     Social History Social History  Substance Use Topics  . Smoking status: Never Smoker  . Smokeless tobacco: Never Used  . Alcohol use No     Comment: "stopped drinking in 2013 drank a couple beers/day,  2 days/wk"     Allergies   Patient has no known allergies.   Review of Systems Review of Systems  Constitutional:       Per HPI, otherwise negative  HENT:       Per HPI, otherwise negative  Respiratory:       Per HPI, otherwise negative  Cardiovascular:       Per HPI, otherwise negative  Gastrointestinal: Negative for vomiting.  Endocrine:       Negative aside from HPI  Genitourinary:       Foley catheter in place  Musculoskeletal:       Prior bilateral below-the-knee amputation  Skin: Negative.   Allergic/Immunologic: Positive for immunocompromised state.  Neurological: Negative for syncope.     Physical Exam Updated Vital Signs BP 95/57   Pulse 111  Temp 98.7 F (37.1 C) (Oral)   Resp 18   SpO2 96%   Physical Exam  Constitutional: She is oriented to person, place, and time.  Ill-appearing, cachectic, elderly female offered brief responses, speaking clearly  HENT:  Head: Normocephalic and atraumatic.  Dry mucous membranes  Eyes: Conjunctivae and EOM are normal.  Cardiovascular: Normal rate and regular rhythm.   Pulmonary/Chest: No stridor. No respiratory distress.  Decreased breath sounds throughout  Abdominal: She exhibits no distension. There is no tenderness.  Musculoskeletal: She exhibits no edema.  Bilateral below the knee amputation unremarkable  Neurological: She is alert and oriented to person, place, and time. No cranial nerve deficit.  Skin: Skin is warm and dry.  1 clean, dressed sacral wound, no surrounding erythema  Psychiatric: She has a normal mood and affect.  Nursing note and vitals reviewed.    ED Treatments / Results  Labs (all labs ordered are listed, but only abnormal results are displayed) Labs Reviewed  COMPREHENSIVE METABOLIC PANEL  CBC WITH DIFFERENTIAL/PLATELET  URINALYSIS, ROUTINE W REFLEX MICROSCOPIC    EKG  EKG Interpretation  Date/Time:  Sunday 2016/03/28 09:29:57 EST Ventricular Rate:  108 PR Interval:     QRS Duration: 89 QT Interval:  321 QTC Calculation: 431 R Axis:   -64 Text Interpretation:  Sinus tachycardia Left anterior fascicular block Probable left ventricular hypertrophy ST-t wave abnormality No significant change since last tracing Abnormal ekg Confirmed by Gerhard Munch  MD [phone]) on March 28, 2016 9:34:00 AM       Radiology Dg Chest 2 View  Result Date: March 28, 2016 CLINICAL DATA:  Few weeks since dialysis yesterday, back hurts, neck hurts, history coronary artery disease, CHF, diabetes mellitus, stroke EXAM: CHEST  2 VIEW COMPARISON:  12/08/2015 FINDINGS: Minimal enlargement of cardiac silhouette. Atherosclerotic calcification aorta. Mediastinal contours and pulmonary vascularity normal. Atelectasis versus infiltrate in LEFT lower lobe. Skin fold projects over LEFT chest. Remaining lungs grossly clear. No pleural effusion or pneumothorax. Bones demineralized. IMPRESSION: Atelectasis versus consolidation LEFT lower lobe. Aortic atherosclerosis. Electronically Signed   By: Ulyses Southward M.D.   On: 2016/03/28 10:26    Procedures Procedures (including critical care time)  Medications Ordered in ED Medications  ceFEPIme (MAXIPIME) 1 g in dextrose 5 % 50 mL IVPB (not administered)  0.9 %  sodium chloride infusion ( Intravenous New Bag/Given 03/28/2016 0951)  ondansetron (ZOFRAN) injection 4 mg (4 mg Intravenous Given Mar 28, 2016 1050)  fentaNYL (SUBLIMAZE) injection 25 mcg (25 mcg Intravenous Given 28-Mar-2016 1119)     Initial Impression / Assessment and Plan / ED Course  I have reviewed the triage vital signs and the nursing notes.  Pertinent labs & imaging results that were available during my care of the patient were reviewed by me and considered in my medical decision making (see chart for details).  Clinical Course   With initial tachycardic, dehydrated appearance, patient will receive subtle IV fluid resuscitation, pending labs, x-ray.  Evaluation concerning for pneumonia, given the  patient's nursing home residency, ongoing dialysis, she requires treatment for healthcare acquired pneumonia. Patient started on antibiotics, admitted to the hospitalist for further evaluation and management.   Final Clinical Impressions(s) / ED Diagnoses  Pneumonia   Gerhard Munch, MD March 28, 2016 1127

## 2016-03-06 NOTE — Progress Notes (Signed)
Pharmacy Antibiotic Note  Elizabeth [name] is a 71 y.o. female admitted on 02/26/2016 with suspected pneumonia.  Pharmacy has been consulted for vancomycin dosing. Patient is ESRD on HD on [facility] with last HD on 1/13 per notes. WBC elevated at 14 and afebrile. Spoke with RN regarding report when patient arrived, as well as paperwork. Per her review and report, she is unaware if the patient had been receiving abx with HD PTA. Will give vancomycin loading dose now and recommend future doses be ordered based on HD plan. Nephrology yet to see.   Plan: Vancomycin 750 mg IV x1, then order future doses pending HD plan  Monitor HD schedule, clinical picture, culture data, and length of therapy  Vancomycin levels as needed   Weight: 36 kg from 01/2016. Of note, patient with bilateral AKA/BKA, unable to stand for weight and not currently on bed with capability for weighing.   Temp (24hrs), Avg:98.7 F (37.1 C), Min:98.7 F (37.1 C), Max:98.7 F (37.1 C)    Recent Labs Lab 03/18/2016 1007  WBC 14.0*  CREATININE 2.66*    No Known Allergies  York Cerise, PharmD Pharmacy Resident  Pager [phone] 03/20/2016 12:04 PM

## 2016-03-06 NOTE — Clinical Social Work Note (Signed)
Clinical Social Work Assessment  Patient Details  Name: Elizabeth [name] MRN: [id number] Date of Birth: November 04, 1945  Date of referral:  03/18/2016               Reason for consult:  Discharge Planning (patient admitted from [facility])                Permission sought to share information with:  Case Manager, Customer service manager, Family Supports Permission granted to share information::  Yes, Verbal Permission Granted  Name::        Agency::  [facility]  Relationship::  [place] granddaughter  Sport and exercise psychologist Information:     Housing/Transportation Living arrangements for the past 2 months:  [place] of Information:  Patient, Scientist, water quality, Tourist information centre manager, Guardian Patient Interpreter Needed:  None Criminal Activity/Legal Involvement Pertinent to Current Situation/Hospitalization:  No - Comment as needed Significant Relationships:  Adult Children, Other Family Members Lives with:  Facility Resident Do you feel safe going back to the place where you live?  Yes Need for family participation in patient care:  Yes (Comment)  Care giving concerns:  No care giving concerns noted by her granddaughter.  Reports she continues to be LTC at facility and she will return when medically stable.   Social Worker assessment / plan:  LCSW met with patient in ED and spoke with her granddaughter via phone as she lives in [place]. Plan will be to return to NH once medically cleared. Call placed to [facility] and notified of admission. Will have unit SW to follow up with patient and discharge plans.   Employment status:  Retired Forensic scientist:  Information systems manager, Medicaid In [place] PT Recommendations:  Not assessed at this time Pax / Referral to community resources:     Patient/Family's Response to care:  Agreeable to plan  Patient/Family's Understanding of and Emotional Response to Diagnosis, Current Treatment, and Prognosis:  Aware of admission and will be down to  see patient later today per report. Understanding of her current situation and hopeful for return soon as she does not like when she has has to go to the hospital.  Emotional Assessment Appearance:  Appears stated age Attitude/Demeanor/Rapport:    Affect (typically observed):  Accepting, Adaptable Orientation:  Oriented to Self, Oriented to Place Alcohol / Substance use:  Not Applicable Psych involvement (Current and /or in the community):  No (Comment)  Discharge Needs  Concerns to be addressed:  No discharge needs identified Readmission within the last 30 days:  No Current discharge risk:  None Barriers to Discharge:  No Barriers Identified, Continued Medical Work up   [place], LCSW 03/04/2016, 11:43 AM

## 2016-03-06 NOTE — ED Notes (Signed)
Attempted report to 6N 

## 2016-03-06 NOTE — H&P (Addendum)
History and Physical    Elizabeth [name] DOB: 04-16-1945 DOA: 03-15-2016  PCP: Kirt Boys, DO  Patient coming from: SNF   Chief Complaint: lethargy, generalized weakness at facility   HPI: Elizabeth [name] is a 71 y.o. female with medical history significant of ESRD on HD, CAD, PVD s/p left BKA, right AKA, anemia chronic disease, diabetes who presents with generalized weakness. Currently resident of SNF. At the facility, patient has been lethargic, generalized weakness, worsening respiratory cough and shortness of breath. It appears from previous chart, patient is normally alert, oriented 3. On my examination, she is alert and oriented, however, is a very poor historian and does not answer all questions appropriately. She requires redirecting to all questions, answers yes or no only. She does admit to a productive cough but unable to really tell me any history. From medical chart, it appears her last hemodialysis was yesterday. Chart review shows chronic hypotension ranging SBP 80-100.  ED Course: Patient given IV fluid, antibiotics to treat HCAP.   Review of Systems: As per HPI otherwise 10 point review of systems negative.   Past Medical History:  Diagnosis Date  . Acute hyperkalemia 12/24/2013  . Anemia   . Anemia of chronic disease 04/24/2012  . Arthritis of knee, right    "right knee" (06/12/2014)  . Chronic indwelling Foley catheter    "it's been in since ~ 2014" (12/30/2015)  . Coronary artery disease, non-occlusive January 2016   Moderate p&mLAD & Ostial OM; Myoview Nuclear Stress Test 03/21/2014: EF 35%. Mild hypokinesis of the inferior wall with fixed inferior defect suggesting scar. (no culprit lesion on CATH)  . Critical lower limb ischemia    Status post left BKA following left popliteal and peroneal artery endarterectomy with patch angioplasty.  . Diabetes mellitus, insulin dependent (IDDM), uncontrolled (HCC)    Brittle; has h/o Hypo & Hyperglycemic episodes,  Type II  . Diabetic neuropathy (HCC)   . ESRD (end stage renal disease) on dialysis [facility])    [address], [place], [place] (12/30/2015)  . Foley catheter in place    for urinary retention  . Foot infection   . Gout    "was in my LLE"  . Hematemesis April 2016   Hospitalized  . History of CVA (cerebrovascular accident) 08/19/2013   "minor one"  . Hyperlipidemia with target LDL less than 70   . Loss of consciousness (HCC) 08/19/2013  . 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 Urinary retention    DX NEUROGENIC BLADDER--HAS INDWELLING FOLEY CATHETER  . UTI (urinary tract infection) 10/17/2013    Past Surgical History:  Procedure Laterality Date  . ABDOMINAL AORTAGRAM N/A 03/19/2014   Procedure: ABDOMINAL Ronny Flurry;  Surgeon: Sherren Kerns, MD;  Location: [facility] CATH LAB;  Service: Cardiovascular;  Laterality: N/A;  . AMPUTATION Left 03/26/2014   Procedure: AMPUTATION SECOND LEFT TOE;  Surgeon: Sherren Kerns, MD;  Location: [facility] OR;  Service: Vascular;  Laterality: Left;  . AMPUTATION Left 05/10/2014   Procedure: AMPUTATION BELOW KNEE;  Surgeon: Larina Earthly, MD;  Location: [facility] OR;  Service: Vascular;  Laterality: Left;  . AMPUTATION Right 12/31/2015   Procedure: AMPUTATION ABOVE KNEE RIGHT;  Surgeon: Fransisco Hertz, MD;  Location: [facility] OR;  Service: Vascular;  Laterality: Right;  . ANGIOPLASTY Left 04/29/2013   Procedure: ANGIOPLASTY;  Surgeon: Cristal Deer  Adele Dan, MD;  Location: [facility] CATH LAB;  Service: Cardiovascular;  Laterality: Left;  . APPLICATION OF A-CELL OF EXTREMITY N/A 10/29/2014   Procedure: PLACEMENT OF APPLICATION OF A-CELL;  Surgeon: Wayland Denis, DO;  Location: [facility] OR;  Service: Plastics;  Laterality: N/A;  . AV FISTULA PLACEMENT Left 04/28/2014   Procedure: CREATION OF LEFT BRACHIOCEPHALIC  ARTERIOVENOUS (AV)  FISTULA CREATION;  Surgeon: Fransisco Hertz, MD;  Location: [facility] OR;  Service: Vascular;  Laterality: Left;  . BASCILIC VEIN TRANSPOSITION Left 10/30/2012   Procedure: LEFT 1ST STAGE BASCILIC VEIN TRANSPOSITION;  Surgeon: Chuck Hint, MD;  Location[facility] OR;  Service: Vascular;  Laterality: Left;  . BASCILIC VEIN TRANSPOSITION Left 02/05/2013   Procedure: BASCILIC VEIN TRANSPOSITION 2ND STAGE;  Surgeon: Chuck Hint, MD;  Location: [facility] OR;  Service: Vascular;  Laterality: Left;  . BELOW KNEE LEG AMPUTATION Left   . ENDARTERECTOMY POPLITEAL Left 03/26/2014   Procedure: Left popliteal and peroneal artery endarterectomy with vein patch angioplasty;  Surgeon: Sherren Kerns, MD;  Location: [facility] OR;  Service: Vascular;  Laterality: Left;  . ESOPHAGEAL MANOMETRY N/A 11/24/2014   Procedure: ESOPHAGEAL MANOMETRY (EM);  Surgeon: Jeani Hawking, MD;  Location: [facility] ENDOSCOPY;  Service: Endoscopy;  Laterality: N/A;  . ESOPHAGOGASTRODUODENOSCOPY N/A 06/13/2014   Procedure: ESOPHAGOGASTRODUODENOSCOPY (EGD);  Surgeon: Jeani Hawking, MD;  Location: [facility] ENDOSCOPY;  Service: Endoscopy;  Laterality: N/A;  . FISTULOGRAM Left 04/29/2013   Procedure: FISTULOGRAM;  Surgeon: Chuck Hint, MD;  Location: [facility] CATH LAB;  Service: Cardiovascular;  Laterality: Left;  . FISTULOGRAM Left 10/08/2014   Procedure: LEFT ARM FISTULOGRAM;  Surgeon: Fransisco Hertz, MD;  Location: [facility] OR;  Service: Vascular;  Laterality: Left;  . INCISION AND DRAINAGE OF WOUND N/A 10/29/2014   Procedure: IRRIGATION AND DEBRIDEMENT OF SACRAL WOUND, PLACEMENT OF A-CELL;  Surgeon: Wayland Denis, DO;  Location: [facility] OR;  Service: Plastics;  Laterality: N/A;  . INSERTION OF SUPRAPUBIC CATHETER N/A 05/16/2012   Procedure: INSERTION OF SUPRAPUBIC CATHETER;  Surgeon: Sebastian Ache, MD;  Location: [facility] ORS;  Service: Urology;  Laterality: N/A;  . LEFT AND RIGHT HEART CATHETERIZATION WITH CORONARY ANGIOGRAM N/A 03/24/2014   Procedure: LEFT AND RIGHT HEART  CATHETERIZATION WITH CORONARY ANGIOGRAM;  Surgeon: Marykay Lex, MD;  Location: [facility] CATH LAB;  Service: Cardiovascular; Moderate disease in p-mLAD & Ostial OM1; normal right heart pressures. Wedge pressure 29 mmHg. --No lesion to explain inferior defect on Myoview and echocardiogram.   . NM MYOCAR PERF WALL MOTION  04/19/2012/ 02/2014   a) low risk study-EF 44%,mild inferolateral hypkinesis; b) EF 35%. Mild hypokinesis of the inferior wall with fixed inferior defect suggesting scar.  Marland Kitchen RADIOLOGY WITH ANESTHESIA N/A 10/25/2013   Procedure: RADIOLOGY WITH ANESTHESIA;  Surgeon: Medication Radiologist, MD;  Location: [facility] OR;  Service: Radiology;  Laterality: N/A;  . TRANSTHORACIC ECHOCARDIOGRAM  03/22/2014   EF 30-35%, mild LVH. Severe inferior hypokinesis. Mild MR.  Marland Kitchen UPPER GI ENDOSCOPY  03/06/2015   Procedure: UPPER GI ENDOSCOPY;  Surgeon: Karie Soda, MD;  Location: [facility] ORS;  Service: General;;  . VENOPLASTY Left 10/08/2014   Procedure: LEFT CEPHALIC VEIN VENOPLASTY;  Surgeon: Fransisco Hertz, MD;  Location: [facility] OR;  Service: Vascular;  Laterality: Left;     reports that she has never smoked. She has never used smokeless tobacco. She reports that she does not drink alcohol or use drugs.  No Known Allergies  Family History  Problem Relation Age of Onset  . Hyperlipidemia Mother   .  Hypertension Mother   . Hodgkin's lymphoma Father   . Heart disease Sister     Prior to Admission medications   Medication Sig Start Date End Date Taking? Authorizing Provider  ascorbic acid (VITAMIN C) 500 MG tablet Take 500 mg by mouth 2 (two) times daily.   Yes Historical Provider, MD  aspirin EC 81 MG tablet Take 81 mg by mouth daily.   Yes Historical Provider, MD  lidocaine-prilocaine (EMLA) cream Apply 1 application topically Every Tuesday,Thursday,and Saturday with dialysis.    Yes Historical Provider, MD  Multiple Vitamins-Minerals (DECUBI-VITE) CAPS Take 1 capsule by mouth daily.   Yes Historical Provider, MD    Nutritional Supplements (NUTRITIONAL SHAKE PO) Take 1 Units by mouth 3 (three) times daily with meals.   Yes Historical Provider, MD  oxyCODONE-acetaminophen (PERCOCET/ROXICET) 5-325 MG tablet Take 1 tablet by mouth every 8 (eight) hours as needed for severe pain. 01/04/16  Yes Ripudeep Jenna Luo, MD  pantoprazole (PROTONIX) 40 MG tablet Take 1 tablet (40 mg total) by mouth daily. 05/13/14  Yes Joseph Art, DO  Protein POWD Take 2 scoop by mouth 3 (three) times daily with meals.   Yes Historical Provider, MD  sevelamer carbonate (RENVELA) 0.8 g PACK packet Take 1.6 g by mouth 3 (three) times daily with meals.    Yes Historical Provider, MD  insulin aspart (NOVOLOG) 100 UNIT/ML injection 200-250 give 2 units, 251-300 give 4 units, 301-350 give 6 units, 351-400 give 8 units, More than 400-call M.D. 12/14/15   Maretta Bees, MD  ondansetron (ZOFRAN-ODT) 4 MG disintegrating tablet Take 4 mg by mouth every 6 (six) hours as needed for nausea or vomiting.    Historical Provider, MD    Physical Exam: Vitals:   03-07-16 0930 07-Mar-2016 0932 03-07-2016 1047 03-07-2016 1118  BP: 95/57   (!) 92/52  Pulse:   111 108  Resp:      Temp:      TempSrc:      SpO2:  100% 96% 94%    Constitutional: Cachectic, chronically ill-appearing, alert Eyes: PERRL, lids and conjunctivae normal ENMT: Mucous membranes are very dry Neck: normal, supple, no masses, no thyromegaly Respiratory: clear to auscultation bilaterally, left lower lobe crackles, Normal respiratory effort. No accessory muscle use.  Cardiovascular: Tachycardic rate and regular rhythm, no murmurs / rubs / gallops. No extremity edema. 2+ pedal pulses. No carotid bruits.  Abdomen: no tenderness, no masses palpated. No hepatosplenomegaly. Bowel sounds positive.  Musculoskeletal: no clubbing / cyanosis. Left lower leg status post BKA, right lower leg status post AKA Skin: no rashes, lesions, ulcers. No induration, history of stage IV sacral  wound Neurologic: Does not follow questions or directions, alert, answers some questions but not all appropriately, no obvious focal deficits  Labs on Admission: I have personally reviewed following labs and imaging studies  CBC:  Recent Labs Lab 07-Mar-2016 1007  WBC 14.0*  NEUTROABS 13.4*  HGB 11.4*  HCT 34.7*  MCV 81.6  PLT 124*   Basic Metabolic Panel:  Recent Labs Lab March 07, 2016 1007  NA 136  K 4.3  CL 99*  CO2 23  GLUCOSE 135*  BUN 29*  CREATININE 2.66*  CALCIUM 8.9   GFR: CrCl cannot be calculated (Unknown ideal weight.). Liver Function Tests:  Recent Labs Lab 03/07/16 1007  AST 31  ALT 23  ALKPHOS 128*  BILITOT 1.0  PROT 6.2*  ALBUMIN 2.1*   No results for input(s): LIPASE, AMYLASE in the last 168 hours. No results  for input(s): AMMONIA in the last 168 hours. Coagulation Profile: No results for input(s): INR, PROTIME in the last 168 hours. Cardiac Enzymes: No results for input(s): CKTOTAL, CKMB, CKMBINDEX, TROPONINI in the last 168 hours. BNP (last 3 results) No results for input(s): PROBNP in the last 8760 hours. HbA1C: No results for input(s): HGBA1C in the last 72 hours. CBG: No results for input(s): GLUCAP in the last 168 hours. Lipid Profile: No results for input(s): CHOL, HDL, LDLCALC, TRIG, CHOLHDL, LDLDIRECT in the last 72 hours. Thyroid Function Tests: No results for input(s): TSH, T4TOTAL, FREET4, T3FREE, THYROIDAB in the last 72 hours. Anemia Panel: No results for input(s): VITAMINB12, FOLATE, FERRITIN, TIBC, IRON, RETICCTPCT in the last 72 hours. Urine analysis:    Component Value Date/Time   COLORURINE RED (A) 12/08/2015 1746   APPEARANCEUR TURBID (A) 12/08/2015 1746   LABSPEC 1.013 12/08/2015 1746   PHURINE 8.5 (H) 12/08/2015 1746   GLUCOSEU NEGATIVE 12/08/2015 1746   HGBUR LARGE (A) 12/08/2015 1746   BILIRUBINUR SMALL (A) 12/08/2015 1746   KETONESUR NEGATIVE 12/08/2015 1746   PROTEINUR >300 (A) 12/08/2015 1746   UROBILINOGEN  0.2 06/25/2014 1436   NITRITE NEGATIVE 12/08/2015 1746   LEUKOCYTESUR LARGE (A) 12/08/2015 1746   Sepsis Labs: !!!!!!!!!!!!!!!!!!!!!!!!!!!!!!!!!!!!!!!!!!!! @LABRCNTIP (procalcitonin:4,lacticidven:4) )No results found for this or any previous visit (from the past 240 hour(s)).   Radiological Exams on Admission: Dg Chest 2 View  Result Date: 03/14/2016 CLINICAL DATA:  Few weeks since dialysis yesterday, back hurts, neck hurts, history coronary artery disease, CHF, diabetes mellitus, stroke EXAM: CHEST  2 VIEW COMPARISON:  12/08/2015 FINDINGS: Minimal enlargement of cardiac silhouette. Atherosclerotic calcification aorta. Mediastinal contours and pulmonary vascularity normal. Atelectasis versus infiltrate in LEFT lower lobe. Skin fold projects over LEFT chest. Remaining lungs grossly clear. No pleural effusion or pneumothorax. Bones demineralized. IMPRESSION: Atelectasis versus consolidation LEFT lower lobe. Aortic atherosclerosis. Electronically Signed   By: Ulyses Southward M.D.   On: 03/09/2016 10:26    EKG: Independently reviewed. 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 Currently, she is alert and oriented but does zone out during exam and does not answer all questions appropriately or follow  command  ESRD on HD Tu/Th/Sat -Last dialysis was yesterday 1/13 -Please call Nephrology if patient not discharged prior to next HD due   Chronic hypotension  -From chart review, appears her baseline is around SBP 80-100  DM type 2 -Last Ha1c 8.8 -SSI   Chronic normocytic anemia of chronic disease -Baseline Hgb around 8-9 -Stable, trend   PVD -S/p right AKA, left BKA -Continue aspirin  Failure to thrive -Nutrition consult  Stage IV sacral wound present on admission -Wound RN consult   DVT prophylaxis: subq hep Code Status: DNR  Family Communication: no family at bedside Disposition Plan: back to SNF  Consults called: none  Admission status: observation  It is my clinical opinion that referral for OBSERVATION is reasonable and necessary in this 71 y.o. year old female  presenting with symptoms of cough, lethargy, concerning for HCAP  in the context of PMH including ESRD, Dm, HTN  with pertinent physical exam findings of lung exam crackles, tachycardia, hypotension   and pertinent radiographic and laboratory data including CXR showing LLL pneumonia.  The aforementioned taken together are felt to place the patient at high risk for further  clinical deterioration. However it is anticipated that the patient may be medically stable for discharge from the hospital within 24 to 48 hours.   Noralee Stain, DO [facility] [email or web address] Password [facility] 03/20/16, 12:02 PM    Addendum After admission, I spoke with granddaughter who is POA. She is interested in hospice at this time as patient has had progressive decline. I have consulted Palliative Care, SW for residential hospice.   Noralee Stain, DO [facility] [email or web address] Password [email or web address] Mar 20, 2016, 5:27 PM

## 2016-03-06 NOTE — ED Notes (Signed)
Small soft BM cleaned. PT has one dressed wound to sacral area.

## 2016-03-06 NOTE — ED Notes (Signed)
Pt arrived with a foley placed by facility. No urine to collect for U/A.

## 2016-03-06 NOTE — ED Notes (Signed)
No urine in drainage bag. Will collect a sample when/if there is urine in bag.

## 2016-03-07 DIAGNOSIS — I2510 Atherosclerotic heart disease of native coronary artery without angina pectoris: Secondary | ICD-10-CM | POA: Diagnosis present

## 2016-03-07 DIAGNOSIS — Z515 Encounter for palliative care: Secondary | ICD-10-CM | POA: Diagnosis not present

## 2016-03-07 DIAGNOSIS — I5032 Chronic diastolic (congestive) heart failure: Secondary | ICD-10-CM | POA: Diagnosis present

## 2016-03-07 DIAGNOSIS — I132 Hypertensive heart and chronic kidney disease with heart failure and with stage 5 chronic kidney disease, or end stage renal disease: Secondary | ICD-10-CM | POA: Diagnosis present

## 2016-03-07 DIAGNOSIS — D638 Anemia in other chronic diseases classified elsewhere: Secondary | ICD-10-CM | POA: Diagnosis present

## 2016-03-07 DIAGNOSIS — Z992 Dependence on renal dialysis: Secondary | ICD-10-CM | POA: Diagnosis not present

## 2016-03-07 DIAGNOSIS — R64 Cachexia: Secondary | ICD-10-CM | POA: Diagnosis present

## 2016-03-07 DIAGNOSIS — Z681 Body mass index (BMI) 19 or less, adult: Secondary | ICD-10-CM | POA: Diagnosis not present

## 2016-03-07 DIAGNOSIS — E1122 Type 2 diabetes mellitus with diabetic chronic kidney disease: Secondary | ICD-10-CM | POA: Diagnosis present

## 2016-03-07 DIAGNOSIS — J189 Pneumonia, unspecified organism: Secondary | ICD-10-CM | POA: Diagnosis not present

## 2016-03-07 DIAGNOSIS — Z89512 Acquired absence of left leg below knee: Secondary | ICD-10-CM | POA: Diagnosis not present

## 2016-03-07 DIAGNOSIS — I9589 Other hypotension: Secondary | ICD-10-CM | POA: Diagnosis present

## 2016-03-07 DIAGNOSIS — N186 End stage renal disease: Secondary | ICD-10-CM | POA: Diagnosis present

## 2016-03-07 DIAGNOSIS — E11649 Type 2 diabetes mellitus with hypoglycemia without coma: Secondary | ICD-10-CM | POA: Diagnosis present

## 2016-03-07 DIAGNOSIS — R531 Weakness: Secondary | ICD-10-CM | POA: Diagnosis present

## 2016-03-07 DIAGNOSIS — I444 Left anterior fascicular block: Secondary | ICD-10-CM | POA: Diagnosis present

## 2016-03-07 DIAGNOSIS — Y95 Nosocomial condition: Secondary | ICD-10-CM | POA: Diagnosis present

## 2016-03-07 DIAGNOSIS — E860 Dehydration: Secondary | ICD-10-CM | POA: Diagnosis present

## 2016-03-07 DIAGNOSIS — L89154 Pressure ulcer of sacral region, stage 4: Secondary | ICD-10-CM | POA: Diagnosis present

## 2016-03-07 DIAGNOSIS — Z8673 Personal history of transient ischemic attack (TIA), and cerebral infarction without residual deficits: Secondary | ICD-10-CM | POA: Diagnosis not present

## 2016-03-07 DIAGNOSIS — E1121 Type 2 diabetes mellitus with diabetic nephropathy: Secondary | ICD-10-CM | POA: Diagnosis present

## 2016-03-07 DIAGNOSIS — D696 Thrombocytopenia, unspecified: Secondary | ICD-10-CM | POA: Diagnosis present

## 2016-03-07 DIAGNOSIS — E1140 Type 2 diabetes mellitus with diabetic neuropathy, unspecified: Secondary | ICD-10-CM | POA: Diagnosis present

## 2016-03-07 DIAGNOSIS — G9340 Encephalopathy, unspecified: Secondary | ICD-10-CM | POA: Diagnosis present

## 2016-03-07 DIAGNOSIS — A419 Sepsis, unspecified organism: Secondary | ICD-10-CM | POA: Diagnosis present

## 2016-03-07 DIAGNOSIS — R627 Adult failure to thrive: Secondary | ICD-10-CM | POA: Diagnosis present

## 2016-03-07 LAB — BLOOD CULTURE ID PANEL (REFLEXED)
Acinetobacter baumannii: NOT DETECTED
CANDIDA GLABRATA: NOT DETECTED
CANDIDA KRUSEI: NOT DETECTED
CANDIDA TROPICALIS: NOT DETECTED
Candida albicans: NOT DETECTED
Candida parapsilosis: NOT DETECTED
Carbapenem resistance: NOT DETECTED
ESCHERICHIA COLI: NOT DETECTED
Enterobacter cloacae complex: NOT DETECTED
Enterobacteriaceae species: NOT DETECTED
Enterococcus species: NOT DETECTED
Haemophilus influenzae: NOT DETECTED
KLEBSIELLA OXYTOCA: NOT DETECTED
KLEBSIELLA PNEUMONIAE: NOT DETECTED
LISTERIA MONOCYTOGENES: NOT DETECTED
Methicillin resistance: NOT DETECTED
Neisseria meningitidis: NOT DETECTED
PROTEUS SPECIES: NOT DETECTED
Pseudomonas aeruginosa: NOT DETECTED
SERRATIA MARCESCENS: NOT DETECTED
STREPTOCOCCUS SPECIES: NOT DETECTED
Staphylococcus aureus (BCID): NOT DETECTED
Staphylococcus species: DETECTED — AB
Streptococcus agalactiae: NOT DETECTED
Streptococcus pneumoniae: NOT DETECTED
Streptococcus pyogenes: DETECTED — AB
Vancomycin resistance: NOT DETECTED

## 2016-03-07 LAB — BASIC METABOLIC PANEL
Anion gap: 12 (ref 5–15)
BUN: 41 mg/dL — ABNORMAL HIGH (ref 6–20)
CALCIUM: 9.1 mg/dL (ref 8.9–10.3)
CO2: 23 mmol/L (ref 22–32)
CREATININE: 3.21 mg/dL — AB (ref 0.44–1.00)
Chloride: 102 mmol/L (ref 101–111)
GFR calc Af Amer: 16 mL/min — ABNORMAL LOW (ref >60)
GFR calc non Af Amer: 13 mL/min — ABNORMAL LOW (ref >60)
GLUCOSE: 30 mg/dL — AB (ref 65–99)
Potassium: 4.2 mmol/L (ref 3.5–5.1)
Sodium: 137 mmol/L (ref 135–145)

## 2016-03-07 LAB — CBC
HEMATOCRIT: 32.4 % — AB (ref 36.0–46.0)
Hemoglobin: 10.6 g/dL — ABNORMAL LOW (ref 12.0–15.0)
MCH: 26.3 pg (ref 26.0–34.0)
MCHC: 32.7 g/dL (ref 30.0–36.0)
MCV: 80.4 fL (ref 78.0–100.0)
Platelets: 84 10*3/uL — ABNORMAL LOW (ref 150–400)
RBC: 4.03 MIL/uL (ref 3.87–5.11)
RDW: 17.9 % — AB (ref 11.5–15.5)
WBC: 16 10*3/uL — ABNORMAL HIGH (ref 4.0–10.5)

## 2016-03-07 LAB — MRSA PCR SCREENING: MRSA BY PCR: NEGATIVE

## 2016-03-07 LAB — GLUCOSE, CAPILLARY
GLUCOSE-CAPILLARY: 41 mg/dL — AB (ref 65–99)
GLUCOSE-CAPILLARY: 106 mg/dL — AB (ref 65–99)

## 2016-03-07 MED ORDER — DAKINS (1/4 STRENGTH) 0.125 % EX SOLN
Freq: Every day | CUTANEOUS | Status: DC
  Filled 2016-03-07: qty 473

## 2016-03-07 MED ORDER — DEXTROSE 50 % IV SOLN
25.0000 mL | Freq: Once | INTRAVENOUS | Status: AC
  Administered 2016-03-07: 25 mL via INTRAVENOUS

## 2016-03-07 MED ORDER — MORPHINE BOLUS VIA INFUSION
1.0000 mg | INTRAVENOUS | Status: DC | PRN
  Administered 2016-03-07 – 2016-03-08 (×2): 1 mg via INTRAVENOUS
  Filled 2016-03-07: qty 1

## 2016-03-07 MED ORDER — GLYCOPYRROLATE 0.2 MG/ML IJ SOLN
0.4000 mg | Freq: Four times a day (QID) | INTRAMUSCULAR | Status: DC
  Administered 2016-03-07 – 2016-03-09 (×10): 0.4 mg via INTRAVENOUS
  Filled 2016-03-07 (×12): qty 2

## 2016-03-07 MED ORDER — GLYCOPYRROLATE 0.2 MG/ML IJ SOLN: 0.4000 mg | INTRAMUSCULAR | Status: DC | PRN

## 2016-03-07 MED ORDER — LORAZEPAM 2 MG/ML IJ SOLN
1.0000 mg | INTRAMUSCULAR | Status: DC | PRN
Start: 2016-03-07 — End: 2016-03-10

## 2016-03-07 MED ORDER — SODIUM CHLORIDE 0.9 % IV SOLN
1.0000 mg/h | INTRAVENOUS | Status: DC
  Administered 2016-03-07: 1 mg/h via INTRAVENOUS
  Filled 2016-03-07: qty 10

## 2016-03-07 MED ORDER — DEXTROSE 50 % IV SOLN
INTRAVENOUS | Status: AC
  Filled 2016-03-07: qty 50

## 2016-03-07 NOTE — Progress Notes (Signed)
Palliative Medicine consult noted. Due to high referral volume, there may be a delay seeing this patient. Please call the Palliative Medicine Team office at [phone] if recommendations are needed in the interim.  Thank you for inviting [place] to see this patient.  Margret Chance Lorretta Kerce, RN, BSN, [facility] 03/07/2016 9:33 AM Cell [phone] 8:00-4:00 Monday-Friday Office [phone]

## 2016-03-07 NOTE — Consult Note (Signed)
WOC Nurse wound consult note Reason for Consult: sacrum Wound type: Stage 4 Pressure injury sacrum: 1cm x 1.5cm x 0.5cm  Partial thickness skin tears left and right buttock: left 2.0cm x 0.5cm x 0.1cm ; left buttock: 0.5cm x 1.5cm x 0.1cm  Deep tissue injury right lateral thigh: 10cm x 6cm x 0 Pressure Injury POA: Yes Measurement: see above Wound bed: Sacrum: unable to visualize wound bed, able to palpate bone Skin tears x 2 are both 100% pink and clean Right thigh: intact blood filled blister Drainage (amount, consistency, odor) moderate with strong odor from the sacrum, none from the thigh Periwound: intact but dark around the sacral wound; epibole of wound edges of the sacrum Dressing procedure/placement/frequency: Dakins on 1/4" packing strip for odor control for now, chemical debridement Foam to the skin tears to protect and insulate Low air loss mattress for pressure redistribution Agree with palliative care.  Discussed POC with patient and bedside nurse.  Re consult if needed, will not follow at this time. Thanks  Kurk Corniel [employer], RN,CWOCN, CNS [phone])

## 2016-03-07 NOTE — Progress Notes (Signed)
Patient ID: Elizabeth [name], female   DOB: 04-Nov-1945, 71 y.o.   MRN: [id number]    PROGRESS NOTE    Georgena Noll  TIR:[id number] DOB: 12/17/1945 DOA: 03/05/2016  PCP: Gildardo Cranker, DO   Brief Narrative:  71 y.o. female with medical history significant of ESRD on HD, CAD, PVD s/p left BKA, right AKA, anemia chronic disease, diabetes who presented with generalized weakness. Currently resident of SNF. At the facility, patient has been lethargic with generalized weakness, worsening cough and dyspnea.   Assessment & Plan:  Sepsis secondary to LLL HCAP, ? strep pyogenes bacteremia  - pt met criteria for sepsis on admission  - started on vanc and maxipime and will continue same regimen for now, day #2 of ABX - preliminary blood cultures with strep pyogenes, will need to follow up on final report  - stop IVF as pt more congested on exam and also renal pt  - follow up on sputum culture, strep pneumo - influenza by PCR negative - resp viral panel requested   Acute encephalopathy - secondary to metabolic issues, above - still somnolent  - monitor   ESRD on HD Tu/Th/Sat - Last dialysis was 1/13 - will notify nephrology team   Chronic hypotension  - From chart review, appears her baseline is around SBP 80-100  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  - Last Ha1c 8.8 - hypoglycemic this am, added small dextrose bolus   Chronic normocytic anemia of chronic disease - Baseline Hgb around 8-9 - no signs of bleeding - CBC in AM  PVD - S/p right AKA, left BKA - Continue aspirin  Failure to thrive - Nutrition consulted  - PCT consulted as well   Thrombocytopenia - likely reactive to acute illness - CBC in AM  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 Dakins on 1/4" packing strip for odor control for now, chemical debridement 2. Foam to the skin tears to protect and insulate 3. Low air loss mattress for pressure redistribution  Please note that pt is critically ill, requiring IV ABX for sepsis, currently also ruling out strep pyo bacteremia. Prognosis guarded, PCT consulted for assistance.   DVT prophylaxis: Heparin SQ Code Status: DNR Family Communication: No family at bedside  Disposition Plan: to be determined   Consultants:   PCT  Nephrology   Procedures:   None  Antimicrobials:   Maxipime 1/14 -->  Subjective: No events over night. Pt still rather somnolent and non verbal this AM.   Objective: Vitals:   03/11/2016 1515 03/14/2016 2207 03/07/16 0617 03/07/16 1047  BP:  (!) 71/43 (!) 85/45 (!) 87/41  Pulse:  97 (!) 104 (!) 101  Resp:  18 18   Temp: 99 F (37.2 C) 97.5 F (36.4 C) 98.2 F (36.8 C)   TempSrc: Oral Axillary Axillary   SpO2:  100% 94% 93%  Weight:      Height:        Intake/Output Summary (Last 24 hours) at 03/07/16 1114 Last data filed at 03/07/16 0955  Gross per 24 hour  Intake          1358.75 ml  Output  0 ml  Net          1358.75 ml   Filed Weights   03/03/2016 1354  Weight: 31.7 kg (69 lb 14.4 oz)   Examination:  General exam: Appears somnolent, now answering any questions.  Respiratory system: bilateral rales  Cardiovascular system: RRR. No JVD, murmurs, rubs, gallops or clicks. Gastrointestinal system: Abdomen is nondistended, soft and nontender. No organomegaly or masses felt. Normal bowel sounds heard.  Data Reviewed: I have personally reviewed following labs and imaging studies  CBC:  Recent Labs Lab 02/24/2016 1007 03/07/16 0531  WBC 14.0* 16.0*  NEUTROABS 13.4*  --   HGB 11.4* 10.6*  HCT 34.7* 32.4*  MCV 81.6 80.4  PLT 124* 84*   Basic Metabolic Panel:  Recent Labs Lab 03/03/2016 1007 03/07/16 0531  NA 136 137  K  4.3 4.2  CL 99* 102  CO2 23 23  GLUCOSE 135* 30*  BUN 29* 41*  CREATININE 2.66* 3.21*  CALCIUM 8.9 9.1   Liver Function Tests:  Recent Labs Lab 03/09/2016 1007  AST 31  ALT 23  ALKPHOS 128*  BILITOT 1.0  PROT 6.2*  ALBUMIN 2.1*   CBG:  Recent Labs Lab 03/19/2016 1717 03/05/2016 2203 03/07/16 0803 03/07/16 0846  GLUCAP 109* 78 41* 106*   Recent Results (from the past 240 hour(s))  Culture, blood (routine x 2)     Status: None (Preliminary result)   Collection Time: 03/16/2016 12:09 PM  Result Value Ref Range Status   Specimen Description BLOOD RIGHT FOREARM  Final   Special Requests BOTTLES DRAWN AEROBIC ONLY 5CC  Final   Culture  Setup Time   Final    GRAM POSITIVE COCCI IN CHAINS AEROBIC BOTTLE ONLY CRITICAL RESULT CALLED TO, READ BACK BY AND VERIFIED WITH: V.BRYK,PHARMD AT 0256 ON 03/07/16 BY G.MCADOO    Culture GRAM POSITIVE COCCI  Final   Report Status PENDING  Incomplete  Blood Culture ID Panel (Reflexed)     Status: Abnormal   Collection Time: 02/26/2016 12:09 PM  Result Value Ref Range Status   Enterococcus species NOT DETECTED NOT DETECTED Final   Vancomycin resistance NOT DETECTED NOT DETECTED Final   Listeria monocytogenes NOT DETECTED NOT DETECTED Final   Staphylococcus species DETECTED (A) NOT DETECTED Final    Comment: CRITICAL RESULT CALLED TO, READ BACK BY AND VERIFIED WITH: V.BRYK,PHARMD AT 0256 ON 03/07/16 BY G.MCADOO    Staphylococcus aureus NOT DETECTED NOT DETECTED Final   Methicillin resistance NOT DETECTED NOT DETECTED Final   Streptococcus species NOT DETECTED NOT DETECTED Final   Streptococcus agalactiae NOT DETECTED NOT DETECTED Final   Streptococcus pneumoniae NOT DETECTED NOT DETECTED Final   Streptococcus pyogenes DETECTED (A) NOT DETECTED Final    Comment: CRITICAL RESULT CALLED TO, READ BACK BY AND VERIFIED WITH: V.BRYK,PHARMD AT 0256 ON 03/07/16 BY G.MCADOO    Acinetobacter baumannii NOT DETECTED NOT DETECTED Final   Enterobacteriaceae  species NOT DETECTED NOT DETECTED Final   Enterobacter cloacae complex NOT DETECTED NOT DETECTED Final   Escherichia coli NOT DETECTED NOT DETECTED Final   Klebsiella oxytoca NOT DETECTED NOT DETECTED Final   Klebsiella pneumoniae NOT DETECTED NOT DETECTED Final   Proteus species NOT DETECTED NOT DETECTED Final   Serratia marcescens NOT DETECTED NOT DETECTED Final   Carbapenem resistance NOT DETECTED NOT DETECTED Final   Haemophilus influenzae NOT DETECTED NOT DETECTED Final   Neisseria meningitidis NOT DETECTED NOT DETECTED Final   Pseudomonas aeruginosa NOT DETECTED NOT DETECTED Final     Candida albicans NOT DETECTED NOT DETECTED Final   Candida glabrata NOT DETECTED NOT DETECTED Final   Candida krusei NOT DETECTED NOT DETECTED Final   Candida parapsilosis NOT DETECTED NOT DETECTED Final   Candida tropicalis NOT DETECTED NOT DETECTED Final  Culture, blood (routine x 2)     Status: None (Preliminary result)   Collection Time: 02/27/2016 12:15 PM  Result Value Ref Range Status   Specimen Description BLOOD RIGHT FOREARM  Final   Special Requests BOTTLES DRAWN AEROBIC ONLY 5CC  Final   Culture  Setup Time   Final    GRAM POSITIVE COCCI IN CHAINS AEROBIC BOTTLE ONLY CRITICAL VALUE NOTED.  VALUE IS CONSISTENT WITH PREVIOUSLY REPORTED AND CALLED VALUE.    Culture PENDING  Incomplete   Report Status PENDING  Incomplete      Radiology Studies: Dg Chest 2 View  Result Date: 03/09/2016 CLINICAL DATA:  Few weeks since dialysis yesterday, back hurts, neck hurts, history coronary artery disease, CHF, diabetes mellitus, stroke EXAM: CHEST  2 VIEW COMPARISON:  12/08/2015 FINDINGS: Minimal enlargement of cardiac silhouette. Atherosclerotic calcification aorta. Mediastinal contours and pulmonary vascularity normal. Atelectasis versus infiltrate in LEFT lower lobe. Skin fold projects over LEFT chest. Remaining lungs grossly clear. No pleural effusion or pneumothorax. Bones demineralized. IMPRESSION:  Atelectasis versus consolidation LEFT lower lobe. Aortic atherosclerosis. Electronically Signed   By: Mark  Boles M.D.   On: 02/23/2016 10:26      Scheduled Meds: . aspirin EC  81 mg Oral Daily  . ceFEPime (MAXIPIME) IV  1 g Intravenous Q24H  . dextrose      . heparin  5,000 Units Subcutaneous Q8H  . insulin aspart  0-9 Units Subcutaneous TID WC  . sodium hypochlorite   Topical Daily   Continuous Infusions:   LOS: 0 days   Time spent: 20 minutes   MAGICK-MYERS, ISKRA, MD [facility] Pager [phone]  If 7PM-7AM, please contact night-coverage [email or web address] Password [email or web address] 03/07/2016, 11:14 AM    

## 2016-03-07 NOTE — Progress Notes (Signed)
Nutrition Brief Note  Chart reviewed. Pt now transitioning to comfort care.  No further nutrition interventions warranted at this time.  Please re-consult as needed.   Blanchard Willhite A. Alexandro Line, RD, LDN, CDE Pager: [phone] After hours Pager: [phone]  

## 2016-03-07 NOTE — Progress Notes (Signed)
PHARMACY - PHYSICIAN COMMUNICATION CRITICAL VALUE ALERT - BLOOD CULTURE IDENTIFICATION (BCID)  Results for orders placed or performed during the hospital encounter of 04/03/2016  Blood Culture ID Panel (Reflexed) (Collected: 2016/04/03 12:09 PM)  Result Value Ref Range   Enterococcus species NOT DETECTED NOT DETECTED   Vancomycin resistance NOT DETECTED NOT DETECTED   Listeria monocytogenes NOT DETECTED NOT DETECTED   Staphylococcus species DETECTED (A) NOT DETECTED   Staphylococcus aureus NOT DETECTED NOT DETECTED   Methicillin resistance NOT DETECTED NOT DETECTED   Streptococcus species NOT DETECTED NOT DETECTED   Streptococcus agalactiae NOT DETECTED NOT DETECTED   Streptococcus pneumoniae NOT DETECTED NOT DETECTED   Streptococcus pyogenes DETECTED (A) NOT DETECTED   Acinetobacter baumannii NOT DETECTED NOT DETECTED   Enterobacteriaceae species NOT DETECTED NOT DETECTED   Enterobacter cloacae complex NOT DETECTED NOT DETECTED   Escherichia coli NOT DETECTED NOT DETECTED   Klebsiella oxytoca NOT DETECTED NOT DETECTED   Klebsiella pneumoniae NOT DETECTED NOT DETECTED   Proteus species NOT DETECTED NOT DETECTED   Serratia marcescens NOT DETECTED NOT DETECTED   Carbapenem resistance NOT DETECTED NOT DETECTED   Haemophilus influenzae NOT DETECTED NOT DETECTED   Neisseria meningitidis NOT DETECTED NOT DETECTED   Pseudomonas aeruginosa NOT DETECTED NOT DETECTED   Candida albicans NOT DETECTED NOT DETECTED   Candida glabrata NOT DETECTED NOT DETECTED   Candida krusei NOT DETECTED NOT DETECTED   Candida parapsilosis NOT DETECTED NOT DETECTED   Candida tropicalis NOT DETECTED NOT DETECTED    Name of physician (or Provider) Contacted: A Hugelmeyer  Changes to prescribed antibiotics required: On vanc and cefepime, no change needed for now.  Vernard Gambles, PharmD, BCPS  03/07/2016  3:12 AM

## 2016-03-07 NOTE — Progress Notes (Signed)
0800 Pt is lethargic, opens eyes with verbal stimuli. Blood sugar 41, 30 per lab. D50% 42ml IV given. Dr Izola Price notified. Latest bld sugar 106. 1315 Palliative nurse came in , had a meeting with grand daughter (HCPOA). Comfort care initiated. Morphine infusion 1mg /hr started.

## 2016-03-07 NOTE — Progress Notes (Addendum)
Palliative Medicine RN Note: PMT evaluation. Family at bedside: granddaughter/HCPOA (copy on chart) & niece. Pt is awake but cannot reliably answer questions. She is cachectic, warm, very dry skin w tenting. Family states pt has been stating that she is tired. Pt and family have met with PMT in the past.  Family states that they understand pt is dying and end is very close.  Ready to stop HD and allow natural death. They have had good experiences with hospice and would be willing to send pt to inpt at [facility]. No inpt hospice beds are available; she is on waitlist for them. Family has a copy of "[facility]."  Discussed pt with Dr Hilma Favors; she adjusted orders and initiated comfort measures, as well as cx nephrology consult (family refusing HD). Notified Dr Doyle Askew and discussed with RN Mable Fill.  Marjie Skiff Okley Magnussen, RN, BSN, [facility] 03/07/2016 1:14 PM Cell [phone] 8:00-4:00 Monday-Friday Office [phone]  1/16 Addendum: Family stated yesterday that they want [facility] after pt's death

## 2016-03-08 LAB — GLUCOSE, CAPILLARY: GLUCOSE-CAPILLARY: 91 mg/dL (ref 65–99)

## 2016-03-08 NOTE — Progress Notes (Signed)
Patient ID: Elizabeth [name], female   DOB: 11-24-1945, 71 y.o.   MRN: [id number]    PROGRESS NOTE    Elizabeth [name]  DEY:[id number] DOB: [birth year] DOA: 03/16/2016  PCP: Gildardo Cranker, DO   Brief Narrative:  71 y.o. female with medical history significant of ESRD on HD, CAD, PVD s/p left BKA, right AKA, anemia chronic disease, diabetes who presented with generalized weakness. Currently resident of SNF. At the facility, patient has been lethargic with generalized weakness, worsening cough and dyspnea.   Assessment & Plan:  Sepsis secondary to LLL HCAP, ? 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  - Last Ha1c 8.8  Chronic normocytic anemia of chronic disease - Baseline Hgb around 8-9 - no signs of bleeding - no further blood work in an effort to ensure comfort   PVD - S/p right AKA, left BKA  Failure to thrive - to lethargic for any oral intake at this time   Thrombocytopenia - likely reactive to acute illness  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 Dakins on 1/4" packing strip for odor control for now, chemical debridement 2. Foam to the skin tears to protect and insulate 3. Low air loss  mattress for pressure redistribution  Please note that pt is critically ill, requiring IV ABX for sepsis, currently also ruling out strep pyo bacteremia. Prognosis guarded, PCT consulted for assistance.   DVT prophylaxis: Heparin SQ Code Status: DNR Family Communication: No family at bedside  Disposition Plan: to be determined   Consultants:   PCT  Nephrology   Procedures:   None  Antimicrobials:   Maxipime 1/14 --> discontinued to allow comfort   Subjective: No events over night  Objective: Vitals:   03/04/2016 2207 03/07/16 0617 03/07/16 1047 03/08/16 0207  BP: (!) 71/43 (!) 85/45 (!) 87/41 (!) 87/50  Pulse: 97 (!) 104 (!) 101 (!) 104  Resp: 18 18  19   Temp: 97.5 F (36.4 C) 98.2 F (36.8 C)  99.4 F (37.4 C)  TempSrc: Axillary Axillary  Axillary  SpO2: 100% 94% 93% 93%  Weight:      Height:        Intake/Output Summary (Last 24 hours) at 03/08/16 1133 Last data filed at 03/08/16 1106  Gross per 24 hour  Intake                0 ml  Output                0 ml  Net                0 ml   Filed Weights   03/21/2016 1354  Weight: 31.7 kg (  69 lb 14.4 oz)   Examination:  General exam: Appears lethargic, not answering any questions.  Respiratory system: bilateral rales  Cardiovascular system: RRR. No JVD, murmurs, rubs, gallops or clicks. Gastrointestinal system: Abdomen is nondistended, soft and nontender. No organomegaly or masses felt.   Data Reviewed: I have personally reviewed following labs and imaging studies  CBC:  Recent Labs Lab 03/01/2016 1007 03/07/16 0531  WBC 14.0* 16.0*  NEUTROABS 13.4*  --   HGB 11.4* 10.6*  HCT 34.7* 32.4*  MCV 81.6 80.4  PLT 124* 84*   Basic Metabolic Panel:  Recent Labs Lab 03/23/2016 1007 03/07/16 0531  NA 136 137  K 4.3 4.2  CL 99* 102  CO2 23 23  GLUCOSE 135* 30*  BUN 29* 41*  CREATININE 2.66* 3.21*  CALCIUM 8.9 9.1   Liver Function Tests:  Recent Labs Lab 03/19/2016 1007  AST 31  ALT 23  ALKPHOS  128*  BILITOT 1.0  PROT 6.2*  ALBUMIN 2.1*   CBG:  Recent Labs Lab 03/04/2016 1717 02/23/2016 2203 03/07/16 0803 03/07/16 0846 03/07/16 1219  GLUCAP 109* 78 41* 106* 91   Recent Results (from the past 240 hour(s))  Culture, blood (routine x 2)     Status: Abnormal (Preliminary result)   Collection Time: 02/22/2016 12:09 PM  Result Value Ref Range Status   Specimen Description BLOOD RIGHT FOREARM  Final   Special Requests BOTTLES DRAWN AEROBIC ONLY 5CC  Final   Culture  Setup Time   Final    GRAM POSITIVE COCCI IN CHAINS AEROBIC BOTTLE ONLY CRITICAL RESULT CALLED TO, READ BACK BY AND VERIFIED WITH: V.BRYK,PHARMD AT 0256 ON 03/07/16 BY G.MCADOO    Culture (A)  Final    GROUP A STREP (S.PYOGENES) ISOLATED SUSCEPTIBILITIES TO FOLLOW    Report Status PENDING  Incomplete  Blood Culture ID Panel (Reflexed)     Status: Abnormal   Collection Time: 03/14/2016 12:09 PM  Result Value Ref Range Status   Enterococcus species NOT DETECTED NOT DETECTED Final   Vancomycin resistance NOT DETECTED NOT DETECTED Final   Listeria monocytogenes NOT DETECTED NOT DETECTED Final   Staphylococcus species DETECTED (A) NOT DETECTED Final    Comment: CRITICAL RESULT CALLED TO, READ BACK BY AND VERIFIED WITH: V.BRYK,PHARMD AT 0256 ON 03/07/16 BY G.MCADOO    Staphylococcus aureus NOT DETECTED NOT DETECTED Final   Methicillin resistance NOT DETECTED NOT DETECTED Final   Streptococcus species NOT DETECTED NOT DETECTED Final   Streptococcus agalactiae NOT DETECTED NOT DETECTED Final   Streptococcus pneumoniae NOT DETECTED NOT DETECTED Final   Streptococcus pyogenes DETECTED (A) NOT DETECTED Final    Comment: CRITICAL RESULT CALLED TO, READ BACK BY AND VERIFIED WITH: V.BRYK,PHARMD AT 0256 ON 03/07/16 BY G.MCADOO    Acinetobacter baumannii NOT DETECTED NOT DETECTED Final   Enterobacteriaceae species NOT DETECTED NOT DETECTED Final   Enterobacter cloacae complex NOT DETECTED NOT DETECTED Final   Escherichia  coli NOT DETECTED NOT DETECTED Final   Klebsiella oxytoca NOT DETECTED NOT DETECTED Final   Klebsiella pneumoniae NOT DETECTED NOT DETECTED Final   Proteus species NOT DETECTED NOT DETECTED Final   Serratia marcescens NOT DETECTED NOT DETECTED Final   Carbapenem resistance NOT DETECTED NOT DETECTED Final   Haemophilus influenzae NOT DETECTED NOT DETECTED Final   Neisseria meningitidis NOT DETECTED NOT DETECTED Final   Pseudomonas aeruginosa NOT DETECTED NOT DETECTED Final   Candida albicans NOT DETECTED NOT DETECTED Final   Candida glabrata NOT DETECTED NOT DETECTED Final  Candida krusei NOT DETECTED NOT DETECTED Final   Candida parapsilosis NOT DETECTED NOT DETECTED Final   Candida tropicalis NOT DETECTED NOT DETECTED Final  Culture, blood (routine x 2)     Status: None (Preliminary result)   Collection Time: 03/23/2016 12:15 PM  Result Value Ref Range Status   Specimen Description BLOOD RIGHT FOREARM  Final   Special Requests BOTTLES DRAWN AEROBIC ONLY 5CC  Final   Culture  Setup Time   Final    GRAM POSITIVE COCCI IN CHAINS AEROBIC BOTTLE ONLY CRITICAL VALUE NOTED.  VALUE IS CONSISTENT WITH PREVIOUSLY REPORTED AND CALLED VALUE.    Culture   Final    GRAM POSITIVE COCCI CULTURE REINCUBATED FOR BETTER GROWTH    Report Status PENDING  Incomplete  MRSA PCR Screening     Status: None   Collection Time: 03/07/16  9:40 AM  Result Value Ref Range Status   MRSA by PCR NEGATIVE NEGATIVE Final    Comment:        The GeneXpert MRSA Assay (FDA approved for NASAL specimens only), is one component of a comprehensive MRSA colonization surveillance program. It is not intended to diagnose MRSA infection nor to guide or monitor treatment for MRSA infections.       Radiology Studies: No results found.    Scheduled Meds: . glycopyrrolate  0.4 mg Intravenous Q6H   Continuous Infusions: . morphine 1 mg/hr (03/07/16 1353)     LOS: 1 day   Time spent: 20 minutes    Faye Ramsay, MD [facility] Pager [phone]  If 7PM-7AM, please contact night-coverage [email or web address] Password [facility] 03/08/2016, 11:33 AM

## 2016-03-08 NOTE — Progress Notes (Signed)
Palliative Medicine RN Note: PMT daily follow up. Pt having 20-30 second periods of apnea. Non responsive. There are still waiting lists for beds at [facility] and [facility]   PMT expects pt not to survive to bed availability, but pt remains on waitlist for hospice beds.  Called granddaughter Teodoro Spray to provide update; no answer, left VM.  Margret Chance Akaisha Truman, RN, BSN, [facility] 03/08/2016 10:30 AM Cell [phone] 8:00-4:00 Monday-Friday Office [phone]

## 2016-03-08 NOTE — Consult Note (Signed)
   [facility] CM Inpatient Consult   03/08/2016  Delee Lorge 11-Mar-1945 [id number]   Patient noted for 3 admissions in 6 months on the [facility] ACO registry. Chart review reveals from MD noted the patient is a 71 y.o.femalewith medical history significant of ESRD on HD, CAD, PVD s/p left BKA, right AKA,anemia chronic disease, diabetes who presented with generalized weakness. Currently resident of SNF - long-term. At the facility, patient has been lethargic with generalized weakness, worsening cough and dyspnea. Inpatient RNCM notes that the family is currently pursuing palliative comfort care and to discontinue HD.  No post community follow up for [facility] Care Management will be needed.  For questions, please contact:  Charlesetta Shanks, RN BSN CCM [facility]  [phone] business mobile phone Toll free office [phone]

## 2016-03-08 NOTE — Care Management Note (Signed)
Case Management Note  Patient Details  Name: Aungelique Declark MRN: [id number] Date of Birth: 06-11-1945  Subjective/Objective:     CM following for progression and d/c planning.                Action/Plan: 03/08/2016 Noted that pt is SNF resident, palliative consult and family decision to persue comfort care. Hemodialysis to be stopped. Family wishes for pt [facility] [place]. CSW working on placement in this facility. CM will continue to follow.   Expected Discharge Date:                  Expected Discharge Plan:  Hospice Medical Facility (From [facility])  In-House Referral:  Clinical Social Work  Discharge planning Services  CM Consult  Post Acute Care Choice:    Choice offered to:     DME Arranged:    DME Agency:     HH Arranged:    HH Agency:     Status of Service:  In process, will continue to follow  If discussed at Long Length of Stay Meetings, dates discussed:    Additional Comments:  [name], RN 03/08/2016, 11:42 AM

## 2016-03-09 LAB — CULTURE, BLOOD (ROUTINE X 2)

## 2016-03-09 NOTE — Progress Notes (Signed)
Patient ID: Elizabeth [name], female   DOB: Oct 31, 1945, 71 y.o.   MRN: [id number]    PROGRESS NOTE    Chelbie Bernet  QHU:[id number] DOB: 1945-12-05 DOA: 02/29/2016  PCP: Gildardo Cranker, DO   Brief Narrative:  71 y.o. female with medical history significant of ESRD on HD, CAD, PVD s/p left BKA, right AKA, anemia chronic disease, diabetes who presented with generalized weakness. Currently resident of SNF. At the facility, patient has been lethargic with generalized weakness, worsening cough and dyspnea.   Assessment & Plan:  Sepsis secondary to LLL HCAP, ? 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  - Last Ha1c 8.8  Chronic normocytic anemia of chronic disease - no further blood work in an effort to ensure comfort   PVD - S/p right AKA, left BKA  Failure to thrive - to lethargic for any oral intake at this time   Thrombocytopenia - likely reactive to acute illness - no further blood work to ensure comfort   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 Dakins on 1/4" packing strip for odor control for now, chemical debridement 2. Foam to the skin tears to protect and insulate 3. Low air loss mattress for  pressure redistribution  Please note that pt is critically ill, requiring IV ABX for sepsis, currently also ruling out strep pyo bacteremia. Prognosis guarded, PCT consulted for assistance.   DVT prophylaxis: Heparin SQ Code Status: DNR Family Communication: No family at bedside  Disposition Plan: awaiting bed at residential hospice   Consultants:   PCT  Procedures:   None  Antimicrobials:   Maxipime 1/14 --> discontinued to allow comfort   Subjective: No events over night  Objective: Vitals:   03/07/16 0617 03/07/16 1047 03/08/16 0207 03/09/16 0100  BP: (!) 85/45 (!) 87/41 (!) 87/50 (!) 87/45  Pulse: (!) 104 (!) 101 (!) 104 (!) 104  Resp: 18  19 19   Temp: 98.2 F (36.8 C)  99.4 F (37.4 C) 100 F (37.8 C)  TempSrc: Axillary  Axillary Axillary  SpO2: 94% 93% 93% 93%  Weight:      Height:        Intake/Output Summary (Last 24 hours) at 03/09/16 0851 Last data filed at 03/09/16 0700  Gross per 24 hour  Intake                0 ml  Output                0 ml  Net                0 ml   Filed Weights   03/04/2016 1354  Weight: 31.7 kg (69  lb 14.4 oz)   Examination:  General exam: Appears lethargic, not answering any questions.  Respiratory system: bilateral rales  Cardiovascular system: RRR. No JVD, murmurs, rubs, gallops or clicks. Gastrointestinal system: Abdomen is nondistended, soft and nontender. No organomegaly or masses felt.   Data Reviewed: I have personally reviewed following labs and imaging studies  CBC:  Recent Labs Lab 03/18/2016 1007 03/07/16 0531  WBC 14.0* 16.0*  NEUTROABS 13.4*  --   HGB 11.4* 10.6*  HCT 34.7* 32.4*  MCV 81.6 80.4  PLT 124* 84*   Basic Metabolic Panel:  Recent Labs Lab 03/22/2016 1007 03/07/16 0531  NA 136 137  K 4.3 4.2  CL 99* 102  CO2 23 23  GLUCOSE 135* 30*  BUN 29* 41*  CREATININE 2.66* 3.21*  CALCIUM 8.9 9.1   Liver Function Tests:  Recent Labs Lab 03/17/2016 1007  AST 31  ALT 23  ALKPHOS 128*    BILITOT 1.0  PROT 6.2*  ALBUMIN 2.1*   CBG:  Recent Labs Lab 02/22/2016 1717 03/15/2016 2203 03/07/16 0803 03/07/16 0846 03/07/16 1219  GLUCAP 109* 78 41* 106* 91   Recent Results (from the past 240 hour(s))  Culture, blood (routine x 2)     Status: Abnormal (Preliminary result)   Collection Time: 03/20/2016 12:09 PM  Result Value Ref Range Status   Specimen Description BLOOD RIGHT FOREARM  Final   Special Requests BOTTLES DRAWN AEROBIC ONLY 5CC  Final   Culture  Setup Time   Final    GRAM POSITIVE COCCI IN CHAINS AEROBIC BOTTLE ONLY CRITICAL RESULT CALLED TO, READ BACK BY AND VERIFIED WITH: V.BRYK,PHARMD AT 0256 ON 03/07/16 BY G.MCADOO    Culture (A)  Final    GROUP A STREP (S.PYOGENES) ISOLATED SUSCEPTIBILITIES TO FOLLOW    Report Status PENDING  Incomplete  Blood Culture ID Panel (Reflexed)     Status: Abnormal   Collection Time: 02/26/2016 12:09 PM  Result Value Ref Range Status   Enterococcus species NOT DETECTED NOT DETECTED Final   Vancomycin resistance NOT DETECTED NOT DETECTED Final   Listeria monocytogenes NOT DETECTED NOT DETECTED Final   Staphylococcus species DETECTED (A) NOT DETECTED Final    Comment: CRITICAL RESULT CALLED TO, READ BACK BY AND VERIFIED WITH: V.BRYK,PHARMD AT 0256 ON 03/07/16 BY G.MCADOO    Staphylococcus aureus NOT DETECTED NOT DETECTED Final   Methicillin resistance NOT DETECTED NOT DETECTED Final   Streptococcus species NOT DETECTED NOT DETECTED Final   Streptococcus agalactiae NOT DETECTED NOT DETECTED Final   Streptococcus pneumoniae NOT DETECTED NOT DETECTED Final   Streptococcus pyogenes DETECTED (A) NOT DETECTED Final    Comment: CRITICAL RESULT CALLED TO, READ BACK BY AND VERIFIED WITH: V.BRYK,PHARMD AT 0256 ON 03/07/16 BY G.MCADOO    Acinetobacter baumannii NOT DETECTED NOT DETECTED Final   Enterobacteriaceae species NOT DETECTED NOT DETECTED Final   Enterobacter cloacae complex NOT DETECTED NOT DETECTED Final   Escherichia coli NOT  DETECTED NOT DETECTED Final   Klebsiella oxytoca NOT DETECTED NOT DETECTED Final   Klebsiella pneumoniae NOT DETECTED NOT DETECTED Final   Proteus species NOT DETECTED NOT DETECTED Final   Serratia marcescens NOT DETECTED NOT DETECTED Final   Carbapenem resistance NOT DETECTED NOT DETECTED Final   Haemophilus influenzae NOT DETECTED NOT DETECTED Final   Neisseria meningitidis NOT DETECTED NOT DETECTED Final   Pseudomonas aeruginosa NOT DETECTED NOT DETECTED Final   Candida albicans NOT DETECTED NOT DETECTED Final   Candida glabrata NOT DETECTED NOT DETECTED Final  Candida krusei NOT DETECTED NOT DETECTED Final   Candida parapsilosis NOT DETECTED NOT DETECTED Final   Candida tropicalis NOT DETECTED NOT DETECTED Final  Culture, blood (routine x 2)     Status: Abnormal (Preliminary result)   Collection Time: 03/05/2016 12:15 PM  Result Value Ref Range Status   Specimen Description BLOOD RIGHT FOREARM  Final   Special Requests BOTTLES DRAWN AEROBIC ONLY 5CC  Final   Culture  Setup Time   Final    GRAM POSITIVE COCCI IN CHAINS AEROBIC BOTTLE ONLY CRITICAL VALUE NOTED.  VALUE IS CONSISTENT WITH PREVIOUSLY REPORTED AND CALLED VALUE.    Culture (A)  Final    STREPTOCOCCUS PYOGENES CULTURE REINCUBATED FOR BETTER GROWTH    Report Status PENDING  Incomplete  MRSA PCR Screening     Status: None   Collection Time: 03/07/16  9:40 AM  Result Value Ref Range Status   MRSA by PCR NEGATIVE NEGATIVE Final    Comment:        The GeneXpert MRSA Assay (FDA approved for NASAL specimens only), is one component of a comprehensive MRSA colonization surveillance program. It is not intended to diagnose MRSA infection nor to guide or monitor treatment for MRSA infections.       Radiology Studies: No results found.    Scheduled Meds: . glycopyrrolate  0.4 mg Intravenous Q6H   Continuous Infusions: . morphine 1 mg/hr (03/07/16 1353)     LOS: 2 days   Time spent: 20 minutes    Faye Ramsay, MD [facility] Pager [phone]  If 7PM-7AM, please contact night-coverage [email or web address] Password [email or web address] 03/09/2016, 8:51 AM

## 2016-03-09 NOTE — Progress Notes (Signed)
Palliative Medicine RN Note: Daily f/u. Discussed with Dr Izola Price. Pt is still having apnea and is hypotensive with a PAINAD of 0. Family is asleep at the bedside. RN Aurea Graff not available; updated charge [employer].   Plan PMT f/u daily and prn until hospital death or transfer to inpt hospice.  Margret Chance Dariann Huckaba, RN, BSN, [facility] 03/09/2016 8:05 AM Cell [phone] 8:00-4:00 Monday-Friday Office [phone]

## 2016-03-24 NOTE — Progress Notes (Signed)
180cc of Morphine wasted with Evonnie Dawes, RN in [facility].

## 2016-03-24 NOTE — Care Management Important Message (Addendum)
Late Entry    Important Message  Patient Details  Name: Elizabeth [name] MRN: [id number] Date of Birth: 05-Mar-1945   Medicare Important Message Given:  Yes Late entry, IM given 03/09/2016   [name], RN 03/09/2016, 9:56 AM

## 2016-03-24 NOTE — Progress Notes (Signed)
180cc of Morphine wasted with Bella Kennedy, RN in [employer].

## 2016-03-24 NOTE — Discharge Summary (Signed)
Death Summary  Elizabeth [name] XIH:[id number] DOB: [birth year] DOA: Mar 25, 2016  PCP: Gildardo Cranker, DO  Admit date: March 25, 2016 Date of Death: 2016-03-29 Time of Death: 8:30 am Notification: Gildardo Cranker, DO notified of death of 2016-03-29  History of present illness:  PCP: Gildardo Cranker, DO         Brief Narrative:  71 y.o.femalewith medical history significant of ESRD on HD, CAD, PVD s/p left BKA, right AKA,anemia chronic disease, diabetes who presented with generalized weakness. Currently resident of SNF. At the facility, patient has been lethargic with generalized weakness, worsening cough and dyspnea.   Assessment & Plan:  Sepsis secondary to LLL HCAP, ? 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  - Last Ha1c 8.8  Chronic normocytic anemia of chronic disease - no further blood work done in an effort to ensure comfort   PVD - S/p right AKA, left BKA  Failure to thrive - to lethargic for any oral intake at this time   Thrombocytopenia - likely reactive to acute illness - no further blood work to ensure comfort   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 Dakins on 1/4" packing strip for odor control for now, chemical  debridement 2. Foam to the skin tears to protect and insulate 3. Low air loss mattress for pressure redistribution  Code Status: DNR Family Communication: No family at bedside, family called to be notified   Consultants:   PCT  Procedures:   None  Antimicrobials:   Maxipime Mar 25, 2022 --> discontinued to allow comfort    The results of significant diagnostics from this hospitalization (including imaging, microbiology, ancillary and laboratory) are listed below for reference.    Significant Diagnostic Studies: Dg Chest 2 View  Result Date: 2016-03-25 CLINICAL DATA:  Few weeks since dialysis yesterday, back hurts, neck hurts, history coronary artery disease, CHF, diabetes mellitus, stroke EXAM: CHEST  2 VIEW COMPARISON:  12/08/2015 FINDINGS: Minimal enlargement of cardiac silhouette. Atherosclerotic calcification aorta. Mediastinal contours and pulmonary vascularity normal. Atelectasis versus infiltrate in LEFT lower lobe. Skin fold projects over LEFT chest. Remaining lungs grossly clear. No pleural effusion or pneumothorax. Bones demineralized. IMPRESSION: Atelectasis versus consolidation LEFT lower lobe. Aortic atherosclerosis. Electronically Signed   By: Lavonia Dana M.D.   On: March 25, 2016 10:26   Dg Bone Density  Result Date: 02/12/2016 EXAM: DUAL X-RAY ABSORPTIOMETRY (DXA) FOR BONE MINERAL DENSITY IMPRESSION: Referring Physician:  MONICA CARTER PATIENT: Name: Coree, Riester Patient ID: [id number] Birth Date: [birth year] Height: 51.0 in. Sex: Female Measured: 02/12/2016 Weight: 79.0 lbs. Indications: Advanced Age, Estrogen Deficient, Postmenopausal Fractures: None Treatments: Calcium (E943.0), Vitamin D (E933.5) ASSESSMENT: The BMD measured at Forearm Radius 33% is 0.703 g/cm2 with a T-score of -2.1. This patient  is considered osteopenic according to [facility]) criteria. Lumbar spine was not utilized due to patient being wheelchair bound. 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 Site Region Measured Date Measured Age YA BMD Significant CHANGE T-score Left Forearm Radius 33% 02/12/2016 [age] -2.1 0.703 g/cm2 World Health Organization [facility]) criteria for post-menopausal, Caucasian Women: Normal       T-score at or above -1 SD Osteopenia   T-score between -1 and -2.5 SD Osteoporosis T-score at or below -2.5 SD RECOMMENDATION: [facility] recommends that FDA-approved medical therapies be considered in postmenopausal women and men age 71 or older with a: 1. Hip or vertebral (clinical or morphometric) fracture. 2. T-score of <-2.5 at the spine or hip. 3. Ten-year fracture probability by FRAX of 3% or greater for hip fracture or 20% or greater for major osteoporotic fracture. All treatment decisions require clinical judgment and consideration of individual patient factors, including patient preferences, co-morbidities, previous drug use, risk factors not captured in the FRAX model (e.g. falls, vitamin D deficiency, increased bone turnover, interval significant decline in bone density) and possible under - or over-estimation of fracture risk by FRAX. All patients should ensure an adequate intake of dietary calcium (1200 mg/d) and vitamin D (800 IU daily) unless contraindicated. FOLLOW-UP: People with diagnosed cases of osteoporosis or at high risk for fracture should have regular bone mineral density tests. For patients eligible for Medicare, routine testing is allowed once every 2 years. The testing frequency can be increased to one year for patients who have rapidly progressing disease, those who are receiving or discontinuing medical therapy to restore bone mass, or have additional risk factors. I have reviewed this report, and agree with the above findings. [facility] Electronically Signed   By: Lowella Grip III M.D.   On: 02/12/2016 12:02   Mm Digital Screening Bilateral  Result Date: 02/24/2016 CLINICAL  DATA:  Screening. EXAM: DIGITAL SCREENING BILATERAL MAMMOGRAM WITH CAD COMPARISON:  None. ACR Breast Density Category d: The breast tissue is extremely dense, which lowers the sensitivity of mammography. FINDINGS: There are no findings suspicious for malignancy. Images were processed with CAD. IMPRESSION: No mammographic evidence of malignancy. A result letter of this screening mammogram will be mailed directly to the patient. RECOMMENDATION: Screening mammogram in one year. (Code:SM-B-01Y) BI-RADS CATEGORY  1: Negative. Electronically Signed   By: Pamelia Hoit M.D.   On: 02/24/2016 09:49    Microbiology: Recent Results (from the past 240 hour(s))  Culture, blood (routine x 2)     Status: Abnormal   Collection Time: 02/27/2016 12:09 PM  Result Value Ref Range Status   Specimen Description BLOOD RIGHT FOREARM  Final   Special Requests BOTTLES DRAWN AEROBIC ONLY 5CC  Final   Culture  Setup Time   Final    GRAM POSITIVE COCCI IN CHAINS AEROBIC BOTTLE ONLY CRITICAL RESULT CALLED TO, READ BACK BY AND VERIFIED WITH: V.BRYK,PHARMD AT 0256 ON 03/07/16 BY G.MCADOO    Culture (A)  Final    GROUP A STREP (S.PYOGENES) ISOLATED HEALTH DEPARTMENT NOTIFIED    Report Status 03/09/2016 FINAL  Final   Organism ID, Bacteria GROUP A STREP (S.PYOGENES) ISOLATED  Final      Susceptibility   Group a strep (s.pyogenes) isolated - MIC*    ERYTHROMYCIN <=0.12 SENSITIVE Sensitive     TETRACYCLINE <=0.25 SENSITIVE Sensitive     VANCOMYCIN <=0.12 SENSITIVE Sensitive     CLINDAMYCIN <=0.25 SENSITIVE Sensitive  Inducible Clindamycin NEGATIVE Sensitive     PENICILLIN Value in next row Sensitive      SENSITIVE<=0.06    CEFTRIAXONE Value in next row Sensitive      SENSITIVE<=0.12    * GROUP A STREP (S.PYOGENES) ISOLATED  Blood Culture ID Panel (Reflexed)     Status: Abnormal   Collection Time: 03/23/2016 12:09 PM  Result Value Ref Range Status   Enterococcus species NOT DETECTED NOT DETECTED Final   Vancomycin  resistance NOT DETECTED NOT DETECTED Final   Listeria monocytogenes NOT DETECTED NOT DETECTED Final   Staphylococcus species DETECTED (A) NOT DETECTED Final    Comment: CRITICAL RESULT CALLED TO, READ BACK BY AND VERIFIED WITH: V.BRYK,PHARMD AT 0256 ON 03/07/16 BY G.MCADOO    Staphylococcus aureus NOT DETECTED NOT DETECTED Final   Methicillin resistance NOT DETECTED NOT DETECTED Final   Streptococcus species NOT DETECTED NOT DETECTED Final   Streptococcus agalactiae NOT DETECTED NOT DETECTED Final   Streptococcus pneumoniae NOT DETECTED NOT DETECTED Final   Streptococcus pyogenes DETECTED (A) NOT DETECTED Final    Comment: CRITICAL RESULT CALLED TO, READ BACK BY AND VERIFIED WITH: V.BRYK,PHARMD AT 0256 ON 03/07/16 BY G.MCADOO    Acinetobacter baumannii NOT DETECTED NOT DETECTED Final   Enterobacteriaceae species NOT DETECTED NOT DETECTED Final   Enterobacter cloacae complex NOT DETECTED NOT DETECTED Final   Escherichia coli NOT DETECTED NOT DETECTED Final   Klebsiella oxytoca NOT DETECTED NOT DETECTED Final   Klebsiella pneumoniae NOT DETECTED NOT DETECTED Final   Proteus species NOT DETECTED NOT DETECTED Final   Serratia marcescens NOT DETECTED NOT DETECTED Final   Carbapenem resistance NOT DETECTED NOT DETECTED Final   Haemophilus influenzae NOT DETECTED NOT DETECTED Final   Neisseria meningitidis NOT DETECTED NOT DETECTED Final   Pseudomonas aeruginosa NOT DETECTED NOT DETECTED Final   Candida albicans NOT DETECTED NOT DETECTED Final   Candida glabrata NOT DETECTED NOT DETECTED Final   Candida krusei NOT DETECTED NOT DETECTED Final   Candida parapsilosis NOT DETECTED NOT DETECTED Final   Candida tropicalis NOT DETECTED NOT DETECTED Final  Culture, blood (routine x 2)     Status: Abnormal   Collection Time: 03/23/2016 12:15 PM  Result Value Ref Range Status   Specimen Description BLOOD RIGHT FOREARM  Final   Special Requests BOTTLES DRAWN AEROBIC ONLY 5CC  Final   Culture  Setup  Time   Final    GRAM POSITIVE COCCI IN CHAINS AEROBIC BOTTLE ONLY CRITICAL VALUE NOTED.  VALUE IS CONSISTENT WITH PREVIOUSLY REPORTED AND CALLED VALUE.    Culture (A)  Final    GROUP A STREP (S.PYOGENES) ISOLATED SUSCEPTIBILITIES PERFORMED ON PREVIOUS CULTURE WITHIN THE LAST 5 DAYS.    Report Status 03/09/2016 FINAL  Final  MRSA PCR Screening     Status: None   Collection Time: 03/07/16  9:40 AM  Result Value Ref Range Status   MRSA by PCR NEGATIVE NEGATIVE Final    Comment:        The GeneXpert MRSA Assay (FDA approved for NASAL specimens only), is one component of a comprehensive MRSA colonization surveillance program. It is not intended to diagnose MRSA infection nor to guide or monitor treatment for MRSA infections.      Labs: Basic Metabolic Panel:  Recent Labs Lab 03/14/2016 1007 03/07/16 0531  NA 136 137  K 4.3 4.2  CL 99* 102  CO2 23 23  GLUCOSE 135* 30*  BUN 29* 41*  CREATININE 2.66* 3.21*  CALCIUM 8.9 9.1  Liver Function Tests:  Recent Labs Lab 03/07/2016 1007  AST 31  ALT 23  ALKPHOS 128*  BILITOT 1.0  PROT 6.2*  ALBUMIN 2.1*   No results for input(s): LIPASE, AMYLASE in the last 168 hours. No results for input(s): AMMONIA in the last 168 hours. CBC:  Recent Labs Lab 03/03/2016 1007 03/07/16 0531  WBC 14.0* 16.0*  NEUTROABS 13.4*  --   HGB 11.4* 10.6*  HCT 34.7* 32.4*  MCV 81.6 80.4  PLT 124* 84*   CBG:  Recent Labs Lab 03/21/2016 1717 03/01/2016 2203 03/07/16 0803 03/07/16 0846 03/07/16 1219  GLUCAP 109* 78 41* 106* 91   Urinalysis    Component Value Date/Time   COLORURINE RED (A) 12/08/2015 1746   APPEARANCEUR TURBID (A) 12/08/2015 1746   LABSPEC 1.013 12/08/2015 1746   PHURINE 8.5 (H) 12/08/2015 1746   GLUCOSEU NEGATIVE 12/08/2015 1746   HGBUR LARGE (A) 12/08/2015 1746   BILIRUBINUR SMALL (A) 12/08/2015 1746   KETONESUR NEGATIVE 12/08/2015 1746   PROTEINUR >300 (A) 12/08/2015 1746   UROBILINOGEN 0.2 06/25/2014 1436    NITRITE NEGATIVE 12/08/2015 1746   LEUKOCYTESUR LARGE (A) 12/08/2015 1746   SIGNED:  Faye Ramsay, MD  [facility] Mar 25, 2016, 8:35 AM Pager [phone]  If 7PM-7AM, please contact night-coverage [email or web address] Password [email or web address]

## 2016-03-24 DEATH — deceased
